# Patient Record
Sex: Male | Born: 1935
Health system: Southern US, Community
[De-identification: ages and names within clinical notes are randomized; demographics above are authoritative.]

## PROBLEM LIST (undated history)

## (undated) DIAGNOSIS — R569 Unspecified convulsions: Secondary | ICD-10-CM

## (undated) DIAGNOSIS — I6381 Other cerebral infarction due to occlusion or stenosis of small artery: Secondary | ICD-10-CM

## (undated) DIAGNOSIS — I119 Hypertensive heart disease without heart failure: Secondary | ICD-10-CM

## (undated) DIAGNOSIS — Z87442 Personal history of urinary calculi: Secondary | ICD-10-CM

## (undated) DIAGNOSIS — R001 Bradycardia, unspecified: Secondary | ICD-10-CM

## (undated) DIAGNOSIS — E039 Hypothyroidism, unspecified: Secondary | ICD-10-CM

## (undated) DIAGNOSIS — I6529 Occlusion and stenosis of unspecified carotid artery: Secondary | ICD-10-CM

## (undated) DIAGNOSIS — K8050 Calculus of bile duct without cholangitis or cholecystitis without obstruction: Secondary | ICD-10-CM

## (undated) DIAGNOSIS — M479 Spondylosis, unspecified: Secondary | ICD-10-CM

## (undated) DIAGNOSIS — I739 Peripheral vascular disease, unspecified: Secondary | ICD-10-CM

## (undated) DIAGNOSIS — N184 Chronic kidney disease, stage 4 (severe): Secondary | ICD-10-CM

## (undated) DIAGNOSIS — F329 Major depressive disorder, single episode, unspecified: Secondary | ICD-10-CM

## (undated) DIAGNOSIS — IMO0002 Reserved for concepts with insufficient information to code with codable children: Secondary | ICD-10-CM

## (undated) DIAGNOSIS — M4800 Spinal stenosis, site unspecified: Secondary | ICD-10-CM

## (undated) DIAGNOSIS — E1140 Type 2 diabetes mellitus with diabetic neuropathy, unspecified: Secondary | ICD-10-CM

## (undated) DIAGNOSIS — F3289 Other specified depressive episodes: Secondary | ICD-10-CM

## (undated) DIAGNOSIS — M1710 Unilateral primary osteoarthritis, unspecified knee: Secondary | ICD-10-CM

## (undated) DIAGNOSIS — I639 Cerebral infarction, unspecified: Secondary | ICD-10-CM

## (undated) DIAGNOSIS — E785 Hyperlipidemia, unspecified: Secondary | ICD-10-CM

## (undated) DIAGNOSIS — S14119A Complete lesion at unspecified level of cervical spinal cord, initial encounter: Secondary | ICD-10-CM

## (undated) DIAGNOSIS — E669 Obesity, unspecified: Secondary | ICD-10-CM

## (undated) HISTORY — DX: Spondylosis, unspecified: M47.9

## (undated) HISTORY — DX: Peripheral vascular disease, unspecified: I73.9

## (undated) HISTORY — DX: Unilateral primary osteoarthritis, unspecified knee: M17.10

## (undated) HISTORY — DX: Other specified depressive episodes: F32.89

## (undated) HISTORY — DX: Hyperlipidemia, unspecified: E78.5

## (undated) HISTORY — DX: Obesity, unspecified: E66.9

## (undated) HISTORY — DX: Spinal stenosis, site unspecified: M48.00

## (undated) HISTORY — DX: Major depressive disorder, single episode, unspecified: F32.9

## (undated) HISTORY — DX: Reserved for concepts with insufficient information to code with codable children: IMO0002

## (undated) HISTORY — PX: KIDNEY SURGERY: SHX687

## (undated) HISTORY — DX: Occlusion and stenosis of unspecified carotid artery: I65.29

---

## 1973-12-07 HISTORY — PX: OTHER SURGICAL HISTORY: SHX169

## 1998-12-12 ENCOUNTER — Encounter: Payer: Self-pay | Admitting: Surgery

## 1998-12-12 ENCOUNTER — Ambulatory Visit (HOSPITAL_COMMUNITY): Admission: RE | Admit: 1998-12-12 | Discharge: 1998-12-12 | Payer: Self-pay | Admitting: Surgery

## 2000-05-03 ENCOUNTER — Encounter: Payer: Self-pay | Admitting: *Deleted

## 2000-05-03 ENCOUNTER — Inpatient Hospital Stay (HOSPITAL_COMMUNITY): Admission: EM | Admit: 2000-05-03 | Discharge: 2000-05-04 | Payer: Self-pay | Admitting: Emergency Medicine

## 2001-06-14 ENCOUNTER — Encounter: Payer: Self-pay | Admitting: Family Medicine

## 2001-06-14 ENCOUNTER — Ambulatory Visit (HOSPITAL_COMMUNITY): Admission: RE | Admit: 2001-06-14 | Discharge: 2001-06-14 | Payer: Self-pay | Admitting: Family Medicine

## 2001-06-17 ENCOUNTER — Ambulatory Visit (HOSPITAL_COMMUNITY): Admission: RE | Admit: 2001-06-17 | Discharge: 2001-06-17 | Payer: Self-pay | Admitting: Family Medicine

## 2001-10-28 ENCOUNTER — Ambulatory Visit (HOSPITAL_COMMUNITY): Admission: RE | Admit: 2001-10-28 | Discharge: 2001-10-28 | Payer: Self-pay | Admitting: Otolaryngology

## 2001-10-28 ENCOUNTER — Encounter: Payer: Self-pay | Admitting: Otolaryngology

## 2003-01-18 ENCOUNTER — Encounter: Payer: Self-pay | Admitting: Family Medicine

## 2003-01-18 ENCOUNTER — Ambulatory Visit (HOSPITAL_COMMUNITY): Admission: RE | Admit: 2003-01-18 | Discharge: 2003-01-18 | Payer: Self-pay | Admitting: Family Medicine

## 2003-04-30 ENCOUNTER — Inpatient Hospital Stay (HOSPITAL_COMMUNITY): Admission: EM | Admit: 2003-04-30 | Discharge: 2003-05-04 | Payer: Self-pay | Admitting: Emergency Medicine

## 2003-04-30 ENCOUNTER — Encounter: Payer: Self-pay | Admitting: Emergency Medicine

## 2003-05-02 ENCOUNTER — Encounter: Payer: Self-pay | Admitting: *Deleted

## 2004-02-15 ENCOUNTER — Emergency Department (HOSPITAL_COMMUNITY): Admission: EM | Admit: 2004-02-15 | Discharge: 2004-02-15 | Payer: Self-pay | Admitting: Emergency Medicine

## 2004-11-07 ENCOUNTER — Ambulatory Visit: Payer: Self-pay | Admitting: Family Medicine

## 2004-12-19 ENCOUNTER — Ambulatory Visit: Payer: Self-pay | Admitting: Family Medicine

## 2005-01-23 ENCOUNTER — Ambulatory Visit: Payer: Self-pay | Admitting: Family Medicine

## 2005-03-03 ENCOUNTER — Ambulatory Visit: Payer: Self-pay | Admitting: Family Medicine

## 2005-03-25 ENCOUNTER — Ambulatory Visit: Payer: Self-pay | Admitting: Family Medicine

## 2005-03-26 ENCOUNTER — Ambulatory Visit (HOSPITAL_COMMUNITY): Admission: RE | Admit: 2005-03-26 | Discharge: 2005-03-26 | Payer: Self-pay | Admitting: Family Medicine

## 2005-04-16 ENCOUNTER — Ambulatory Visit (HOSPITAL_COMMUNITY): Admission: RE | Admit: 2005-04-16 | Discharge: 2005-04-16 | Payer: Self-pay | Admitting: Otolaryngology

## 2005-05-05 ENCOUNTER — Encounter (HOSPITAL_BASED_OUTPATIENT_CLINIC_OR_DEPARTMENT_OTHER): Admission: RE | Admit: 2005-05-05 | Discharge: 2005-08-03 | Payer: Self-pay | Admitting: Surgery

## 2005-05-26 ENCOUNTER — Ambulatory Visit: Payer: Self-pay | Admitting: Family Medicine

## 2005-05-28 ENCOUNTER — Ambulatory Visit (HOSPITAL_COMMUNITY): Admission: RE | Admit: 2005-05-28 | Discharge: 2005-05-28 | Payer: Self-pay | Admitting: Family Medicine

## 2005-06-08 ENCOUNTER — Ambulatory Visit: Payer: Self-pay | Admitting: Family Medicine

## 2005-07-20 ENCOUNTER — Ambulatory Visit: Payer: Self-pay | Admitting: Family Medicine

## 2005-08-06 ENCOUNTER — Encounter (HOSPITAL_COMMUNITY): Admission: RE | Admit: 2005-08-06 | Discharge: 2005-09-04 | Payer: Self-pay | Admitting: Neurosurgery

## 2005-08-21 ENCOUNTER — Encounter (HOSPITAL_BASED_OUTPATIENT_CLINIC_OR_DEPARTMENT_OTHER): Admission: RE | Admit: 2005-08-21 | Discharge: 2005-11-19 | Payer: Self-pay | Admitting: Surgery

## 2005-08-31 ENCOUNTER — Emergency Department (HOSPITAL_COMMUNITY): Admission: EM | Admit: 2005-08-31 | Discharge: 2005-08-31 | Payer: Self-pay | Admitting: Emergency Medicine

## 2005-08-31 ENCOUNTER — Ambulatory Visit: Payer: Self-pay | Admitting: Family Medicine

## 2005-09-01 ENCOUNTER — Inpatient Hospital Stay (HOSPITAL_COMMUNITY): Admission: AD | Admit: 2005-09-01 | Discharge: 2005-09-03 | Payer: Self-pay | Admitting: Internal Medicine

## 2005-09-01 ENCOUNTER — Ambulatory Visit: Payer: Self-pay | Admitting: Family Medicine

## 2005-09-07 ENCOUNTER — Ambulatory Visit: Payer: Self-pay | Admitting: Family Medicine

## 2005-09-17 ENCOUNTER — Ambulatory Visit: Payer: Self-pay | Admitting: Family Medicine

## 2005-10-22 ENCOUNTER — Ambulatory Visit: Payer: Self-pay | Admitting: Family Medicine

## 2005-12-03 ENCOUNTER — Ambulatory Visit: Payer: Self-pay | Admitting: Family Medicine

## 2005-12-24 ENCOUNTER — Encounter (HOSPITAL_BASED_OUTPATIENT_CLINIC_OR_DEPARTMENT_OTHER): Admission: RE | Admit: 2005-12-24 | Discharge: 2006-03-24 | Payer: Self-pay | Admitting: Surgery

## 2006-01-14 ENCOUNTER — Ambulatory Visit: Payer: Self-pay | Admitting: Family Medicine

## 2006-02-11 ENCOUNTER — Ambulatory Visit: Payer: Self-pay | Admitting: Family Medicine

## 2006-03-25 ENCOUNTER — Ambulatory Visit: Payer: Self-pay | Admitting: Family Medicine

## 2006-04-01 ENCOUNTER — Encounter (HOSPITAL_BASED_OUTPATIENT_CLINIC_OR_DEPARTMENT_OTHER): Admission: RE | Admit: 2006-04-01 | Discharge: 2006-04-13 | Payer: Self-pay | Admitting: Internal Medicine

## 2006-04-15 ENCOUNTER — Ambulatory Visit: Payer: Self-pay | Admitting: Family Medicine

## 2006-05-13 ENCOUNTER — Ambulatory Visit: Payer: Self-pay | Admitting: Family Medicine

## 2006-07-08 ENCOUNTER — Ambulatory Visit: Payer: Self-pay | Admitting: Family Medicine

## 2006-07-20 ENCOUNTER — Ambulatory Visit: Payer: Self-pay | Admitting: Family Medicine

## 2006-07-21 ENCOUNTER — Encounter (HOSPITAL_BASED_OUTPATIENT_CLINIC_OR_DEPARTMENT_OTHER): Admission: RE | Admit: 2006-07-21 | Discharge: 2006-09-02 | Payer: Self-pay | Admitting: Surgery

## 2006-09-06 ENCOUNTER — Encounter (HOSPITAL_BASED_OUTPATIENT_CLINIC_OR_DEPARTMENT_OTHER): Admission: RE | Admit: 2006-09-06 | Discharge: 2006-11-22 | Payer: Self-pay | Admitting: Surgery

## 2006-09-14 ENCOUNTER — Ambulatory Visit: Payer: Self-pay | Admitting: Family Medicine

## 2006-10-14 ENCOUNTER — Ambulatory Visit: Payer: Self-pay | Admitting: Family Medicine

## 2007-05-17 ENCOUNTER — Ambulatory Visit: Payer: Self-pay | Admitting: Family Medicine

## 2007-05-17 LAB — CONVERTED CEMR LAB
ALT: 15 units/L (ref 0–53)
AST: 18 units/L (ref 0–37)
Albumin: 3.9 g/dL (ref 3.5–5.2)
Alkaline Phosphatase: 98 units/L (ref 39–117)
BUN: 17 mg/dL (ref 6–23)
Bilirubin, Direct: 0.1 mg/dL (ref 0.0–0.3)
CO2: 25 meq/L (ref 19–32)
Calcium: 9.4 mg/dL (ref 8.4–10.5)
Chloride: 100 meq/L (ref 96–112)
Creatinine, Ser: 1.41 mg/dL (ref 0.40–1.50)
Glucose, Bld: 123 mg/dL — ABNORMAL HIGH (ref 70–99)
Indirect Bilirubin: 0.3 mg/dL (ref 0.0–0.9)
Potassium: 4.7 meq/L (ref 3.5–5.3)
Sodium: 138 meq/L (ref 135–145)
TSH: 10.786 microintl units/mL — ABNORMAL HIGH (ref 0.350–5.50)
Total Bilirubin: 0.4 mg/dL (ref 0.3–1.2)
Total Protein: 7.4 g/dL (ref 6.0–8.3)

## 2007-07-06 ENCOUNTER — Ambulatory Visit: Payer: Self-pay | Admitting: Family Medicine

## 2007-07-06 LAB — CONVERTED CEMR LAB
Basophils Absolute: 0 10*3/uL (ref 0.0–0.1)
Basophils Relative: 0 % (ref 0–1)
Eosinophils Absolute: 0.2 10*3/uL (ref 0.0–0.7)
Eosinophils Relative: 3 % (ref 0–5)
Ferritin: 412 ng/mL — ABNORMAL HIGH (ref 22–322)
Folate: 10.2 ng/mL
HCT: 43.3 % (ref 39.0–52.0)
Hemoglobin: 14.1 g/dL (ref 13.0–17.0)
Iron: 103 ug/dL (ref 42–165)
Lymphocytes Relative: 30 % (ref 12–46)
Lymphs Abs: 1.9 10*3/uL (ref 0.7–3.3)
MCHC: 32.6 g/dL (ref 30.0–36.0)
MCV: 96.7 fL (ref 78.0–100.0)
Monocytes Absolute: 0.5 10*3/uL (ref 0.2–0.7)
Monocytes Relative: 7 % (ref 3–11)
Neutro Abs: 3.8 10*3/uL (ref 1.7–7.7)
Neutrophils Relative %: 60 % (ref 43–77)
Platelets: 222 10*3/uL (ref 150–400)
RBC: 4.48 M/uL (ref 4.22–5.81)
RDW: 13.2 % (ref 11.5–14.0)
Retic Count, Absolute: 44.8 (ref 19.0–186.0)
Retic Ct Pct: 1 % (ref 0.4–3.1)
Saturation Ratios: 40 % (ref 20–55)
TIBC: 259 ug/dL (ref 215–435)
UIBC: 156 ug/dL
Vitamin B-12: 829 pg/mL (ref 211–911)
WBC: 6.4 10*3/uL (ref 4.0–10.5)

## 2007-08-22 ENCOUNTER — Ambulatory Visit: Payer: Self-pay | Admitting: Family Medicine

## 2007-08-22 LAB — CONVERTED CEMR LAB
Cholesterol: 204 mg/dL — ABNORMAL HIGH (ref 0–200)
HDL: 66 mg/dL (ref >39)
LDL Cholesterol: 123 mg/dL — ABNORMAL HIGH (ref 0–99)
TSH: 7.268 microintl units/mL — ABNORMAL HIGH (ref 0.350–5.50)
Total CHOL/HDL Ratio: 3.1
Triglycerides: 74 mg/dL (ref <150)
VLDL: 15 mg/dL (ref 0–40)

## 2007-10-05 ENCOUNTER — Ambulatory Visit: Payer: Self-pay | Admitting: Family Medicine

## 2007-11-23 ENCOUNTER — Ambulatory Visit: Payer: Self-pay | Admitting: Family Medicine

## 2007-11-23 LAB — CONVERTED CEMR LAB
ALT: 21 units/L (ref 0–53)
AST: 19 units/L (ref 0–37)
Albumin: 3.9 g/dL (ref 3.5–5.2)
Alkaline Phosphatase: 120 units/L — ABNORMAL HIGH (ref 39–117)
BUN: 16 mg/dL (ref 6–23)
Bilirubin, Direct: 0.1 mg/dL (ref 0.0–0.3)
CO2: 25 meq/L (ref 19–32)
Calcium: 9.6 mg/dL (ref 8.4–10.5)
Chloride: 101 meq/L (ref 96–112)
Cholesterol: 168 mg/dL (ref 0–200)
Creatinine, Ser: 1.5 mg/dL (ref 0.40–1.50)
Glucose, Bld: 361 mg/dL — ABNORMAL HIGH (ref 70–99)
HDL: 70 mg/dL (ref >39)
Indirect Bilirubin: 0.4 mg/dL (ref 0.0–0.9)
LDL Cholesterol: 82 mg/dL (ref 0–99)
Microalb, Ur: 2.88 mg/dL — ABNORMAL HIGH (ref 0.00–1.89)
PSA: 1.76 ng/mL (ref 0.10–4.00)
Potassium: 4.7 meq/L (ref 3.5–5.3)
Sodium: 137 meq/L (ref 135–145)
TSH: 4.11 microintl units/mL (ref 0.350–5.50)
Total Bilirubin: 0.5 mg/dL (ref 0.3–1.2)
Total CHOL/HDL Ratio: 2.4
Total Protein: 7.2 g/dL (ref 6.0–8.3)
Triglycerides: 79 mg/dL (ref <150)
VLDL: 16 mg/dL (ref 0–40)

## 2007-11-24 ENCOUNTER — Encounter: Payer: Self-pay | Admitting: Family Medicine

## 2007-11-24 LAB — CONVERTED CEMR LAB: PSA: 1.76 ng/mL

## 2007-12-29 ENCOUNTER — Ambulatory Visit: Payer: Self-pay | Admitting: Family Medicine

## 2008-01-02 ENCOUNTER — Ambulatory Visit (HOSPITAL_COMMUNITY): Admission: RE | Admit: 2008-01-02 | Discharge: 2008-01-02 | Payer: Self-pay | Admitting: Family Medicine

## 2008-01-19 ENCOUNTER — Emergency Department (HOSPITAL_COMMUNITY): Admission: EM | Admit: 2008-01-19 | Discharge: 2008-01-19 | Payer: Self-pay | Admitting: Emergency Medicine

## 2008-01-26 ENCOUNTER — Ambulatory Visit: Payer: Self-pay | Admitting: Orthopedic Surgery

## 2008-01-26 ENCOUNTER — Encounter (INDEPENDENT_AMBULATORY_CARE_PROVIDER_SITE_OTHER): Payer: Self-pay | Admitting: *Deleted

## 2008-01-26 DIAGNOSIS — M25569 Pain in unspecified knee: Secondary | ICD-10-CM | POA: Insufficient documentation

## 2008-01-26 DIAGNOSIS — M1710 Unilateral primary osteoarthritis, unspecified knee: Secondary | ICD-10-CM

## 2008-01-26 DIAGNOSIS — M4800 Spinal stenosis, site unspecified: Secondary | ICD-10-CM | POA: Insufficient documentation

## 2008-01-26 DIAGNOSIS — M549 Dorsalgia, unspecified: Secondary | ICD-10-CM | POA: Insufficient documentation

## 2008-01-26 DIAGNOSIS — M545 Low back pain, unspecified: Secondary | ICD-10-CM | POA: Insufficient documentation

## 2008-01-26 DIAGNOSIS — M479 Spondylosis, unspecified: Secondary | ICD-10-CM | POA: Insufficient documentation

## 2008-01-26 DIAGNOSIS — IMO0002 Reserved for concepts with insufficient information to code with codable children: Secondary | ICD-10-CM | POA: Insufficient documentation

## 2008-01-26 DIAGNOSIS — M23302 Other meniscus derangements, unspecified lateral meniscus, unspecified knee: Secondary | ICD-10-CM | POA: Insufficient documentation

## 2008-01-27 ENCOUNTER — Ambulatory Visit: Payer: Self-pay | Admitting: Family Medicine

## 2008-01-31 ENCOUNTER — Encounter: Payer: Self-pay | Admitting: Orthopedic Surgery

## 2008-02-07 ENCOUNTER — Telehealth: Payer: Self-pay | Admitting: Orthopedic Surgery

## 2008-02-14 ENCOUNTER — Encounter (HOSPITAL_BASED_OUTPATIENT_CLINIC_OR_DEPARTMENT_OTHER): Admission: RE | Admit: 2008-02-14 | Discharge: 2008-04-09 | Payer: Self-pay | Admitting: Surgery

## 2008-02-14 ENCOUNTER — Ambulatory Visit: Payer: Self-pay | Admitting: Family Medicine

## 2008-02-17 ENCOUNTER — Encounter: Payer: Self-pay | Admitting: Orthopedic Surgery

## 2008-03-01 ENCOUNTER — Ambulatory Visit: Payer: Self-pay | Admitting: Family Medicine

## 2008-03-09 ENCOUNTER — Encounter: Payer: Self-pay | Admitting: Family Medicine

## 2008-03-09 DIAGNOSIS — E039 Hypothyroidism, unspecified: Secondary | ICD-10-CM | POA: Insufficient documentation

## 2008-03-09 DIAGNOSIS — E782 Mixed hyperlipidemia: Secondary | ICD-10-CM | POA: Insufficient documentation

## 2008-03-09 DIAGNOSIS — I150 Renovascular hypertension: Secondary | ICD-10-CM | POA: Insufficient documentation

## 2008-03-09 DIAGNOSIS — F329 Major depressive disorder, single episode, unspecified: Secondary | ICD-10-CM | POA: Insufficient documentation

## 2008-03-09 DIAGNOSIS — I739 Peripheral vascular disease, unspecified: Secondary | ICD-10-CM | POA: Insufficient documentation

## 2008-03-09 DIAGNOSIS — F3289 Other specified depressive episodes: Secondary | ICD-10-CM | POA: Insufficient documentation

## 2008-03-09 DIAGNOSIS — E669 Obesity, unspecified: Secondary | ICD-10-CM | POA: Insufficient documentation

## 2008-04-12 ENCOUNTER — Ambulatory Visit: Payer: Self-pay | Admitting: Family Medicine

## 2008-06-12 ENCOUNTER — Ambulatory Visit: Payer: Self-pay | Admitting: Family Medicine

## 2008-06-13 ENCOUNTER — Encounter: Payer: Self-pay | Admitting: Family Medicine

## 2008-06-13 LAB — CONVERTED CEMR LAB
ALT: 16 units/L (ref 0–53)
AST: 18 units/L (ref 0–37)
Albumin: 3.7 g/dL (ref 3.5–5.2)
Alkaline Phosphatase: 86 units/L (ref 39–117)
BUN: 17 mg/dL (ref 6–23)
Basophils Absolute: 0 10*3/uL (ref 0.0–0.1)
Basophils Relative: 0 % (ref 0–1)
Bilirubin, Direct: 0.1 mg/dL (ref 0.0–0.3)
CO2: 23 meq/L (ref 19–32)
Calcium: 9.1 mg/dL (ref 8.4–10.5)
Chloride: 106 meq/L (ref 96–112)
Cholesterol: 178 mg/dL (ref 0–200)
Creatinine, Ser: 1.33 mg/dL (ref 0.40–1.50)
Eosinophils Absolute: 0.1 10*3/uL (ref 0.0–0.7)
Eosinophils Relative: 2 % (ref 0–5)
Glucose, Bld: 108 mg/dL — ABNORMAL HIGH (ref 70–99)
HCT: 38.2 % — ABNORMAL LOW (ref 39.0–52.0)
HDL: 68 mg/dL (ref >39)
Hemoglobin: 12.2 g/dL — ABNORMAL LOW (ref 13.0–17.0)
Indirect Bilirubin: 0.3 mg/dL (ref 0.0–0.9)
LDL Cholesterol: 100 mg/dL — ABNORMAL HIGH (ref 0–99)
Lymphocytes Relative: 23 % (ref 12–46)
Lymphs Abs: 1.5 10*3/uL (ref 0.7–4.0)
MCHC: 31.9 g/dL (ref 30.0–36.0)
MCV: 98.7 fL (ref 78.0–100.0)
Monocytes Absolute: 0.6 10*3/uL (ref 0.1–1.0)
Monocytes Relative: 10 % (ref 3–12)
Neutro Abs: 4.4 10*3/uL (ref 1.7–7.7)
Neutrophils Relative %: 65 % (ref 43–77)
Platelets: 232 10*3/uL (ref 150–400)
Potassium: 4.1 meq/L (ref 3.5–5.3)
RBC: 3.87 M/uL — ABNORMAL LOW (ref 4.22–5.81)
RDW: 13.3 % (ref 11.5–15.5)
Sodium: 140 meq/L (ref 135–145)
TSH: 0.931 microintl units/mL (ref 0.350–4.50)
Total Bilirubin: 0.4 mg/dL (ref 0.3–1.2)
Total CHOL/HDL Ratio: 2.6
Total Protein: 6.6 g/dL (ref 6.0–8.3)
Triglycerides: 48 mg/dL (ref <150)
VLDL: 10 mg/dL (ref 0–40)
WBC: 6.7 10*3/uL (ref 4.0–10.5)

## 2008-06-14 ENCOUNTER — Encounter: Payer: Self-pay | Admitting: Family Medicine

## 2008-06-14 LAB — CONVERTED CEMR LAB: Retic Ct Pct: 1.3 % (ref 0.4–3.1)

## 2008-08-21 ENCOUNTER — Ambulatory Visit: Payer: Self-pay | Admitting: Family Medicine

## 2008-08-21 DIAGNOSIS — E1021 Type 1 diabetes mellitus with diabetic nephropathy: Secondary | ICD-10-CM | POA: Insufficient documentation

## 2008-08-21 LAB — CONVERTED CEMR LAB
Glucose, Bld: 285 mg/dL
Hgb A1c MFr Bld: 8.3 %
OCCULT 1: NEGATIVE

## 2008-08-29 ENCOUNTER — Encounter: Payer: Self-pay | Admitting: Family Medicine

## 2008-09-12 ENCOUNTER — Ambulatory Visit: Payer: Self-pay | Admitting: Family Medicine

## 2008-09-12 LAB — CONVERTED CEMR LAB: Blood Glucose, Fingerstick: 234

## 2008-09-28 ENCOUNTER — Telehealth: Payer: Self-pay | Admitting: Family Medicine

## 2008-11-08 ENCOUNTER — Encounter (HOSPITAL_BASED_OUTPATIENT_CLINIC_OR_DEPARTMENT_OTHER): Admission: RE | Admit: 2008-11-08 | Discharge: 2008-12-03 | Payer: Self-pay | Admitting: Internal Medicine

## 2008-11-14 ENCOUNTER — Ambulatory Visit: Payer: Self-pay | Admitting: Family Medicine

## 2008-11-14 LAB — CONVERTED CEMR LAB
Glucose, Bld: 270 mg/dL
Hgb A1c MFr Bld: 9.6 %

## 2008-12-27 ENCOUNTER — Ambulatory Visit: Payer: Self-pay | Admitting: Family Medicine

## 2008-12-27 DIAGNOSIS — J019 Acute sinusitis, unspecified: Secondary | ICD-10-CM | POA: Insufficient documentation

## 2008-12-31 ENCOUNTER — Encounter (HOSPITAL_BASED_OUTPATIENT_CLINIC_OR_DEPARTMENT_OTHER): Admission: RE | Admit: 2008-12-31 | Discharge: 2009-03-31 | Payer: Self-pay | Admitting: Internal Medicine

## 2008-12-31 ENCOUNTER — Encounter (HOSPITAL_BASED_OUTPATIENT_CLINIC_OR_DEPARTMENT_OTHER): Admission: RE | Admit: 2008-12-31 | Discharge: 2009-04-01 | Payer: Self-pay | Admitting: Internal Medicine

## 2008-12-31 LAB — CONVERTED CEMR LAB: TSH: 11.023 microintl units/mL — ABNORMAL HIGH (ref 0.350–4.50)

## 2009-01-10 ENCOUNTER — Encounter: Payer: Self-pay | Admitting: Family Medicine

## 2009-01-30 ENCOUNTER — Encounter: Payer: Self-pay | Admitting: Family Medicine

## 2009-02-25 ENCOUNTER — Encounter: Payer: Self-pay | Admitting: Family Medicine

## 2009-03-13 ENCOUNTER — Encounter: Payer: Self-pay | Admitting: Family Medicine

## 2009-04-08 ENCOUNTER — Telehealth: Payer: Self-pay | Admitting: Family Medicine

## 2009-04-08 ENCOUNTER — Telehealth (INDEPENDENT_AMBULATORY_CARE_PROVIDER_SITE_OTHER): Payer: Self-pay | Admitting: *Deleted

## 2009-04-09 ENCOUNTER — Encounter: Payer: Self-pay | Admitting: Family Medicine

## 2009-04-16 ENCOUNTER — Ambulatory Visit: Payer: Self-pay | Admitting: Family Medicine

## 2009-04-16 LAB — CONVERTED CEMR LAB
Glucose, Bld: 214 mg/dL
Hgb A1c MFr Bld: 8.9 %

## 2009-04-17 ENCOUNTER — Telehealth: Payer: Self-pay | Admitting: Family Medicine

## 2009-04-17 LAB — CONVERTED CEMR LAB
ALT: 15 units/L (ref 0–53)
AST: 19 units/L (ref 0–37)
Albumin: 3.8 g/dL (ref 3.5–5.2)
Alkaline Phosphatase: 81 units/L (ref 39–117)
BUN: 18 mg/dL (ref 6–23)
Bilirubin, Direct: 0.1 mg/dL (ref 0.0–0.3)
CO2: 23 meq/L (ref 19–32)
Calcium: 9.3 mg/dL (ref 8.4–10.5)
Chloride: 105 meq/L (ref 96–112)
Cholesterol: 204 mg/dL — ABNORMAL HIGH (ref 0–200)
Creatinine, Ser: 1.54 mg/dL — ABNORMAL HIGH (ref 0.40–1.50)
Creatinine, Urine: 117.9 mg/dL
Glucose, Bld: 205 mg/dL — ABNORMAL HIGH (ref 70–99)
HDL: 62 mg/dL (ref >39)
Indirect Bilirubin: 0.3 mg/dL (ref 0.0–0.9)
LDL Cholesterol: 126 mg/dL — ABNORMAL HIGH (ref 0–99)
Microalb Creat Ratio: 122.2 mg/g — ABNORMAL HIGH (ref 0.0–30.0)
Microalb, Ur: 14.41 mg/dL — ABNORMAL HIGH (ref 0.00–1.89)
PSA: 1.97 ng/mL (ref 0.10–4.00)
Potassium: 4.6 meq/L (ref 3.5–5.3)
Sodium: 138 meq/L (ref 135–145)
TSH: 15.529 microintl units/mL — ABNORMAL HIGH (ref 0.350–4.500)
Total Bilirubin: 0.4 mg/dL (ref 0.3–1.2)
Total CHOL/HDL Ratio: 3.3
Total Protein: 7 g/dL (ref 6.0–8.3)
Triglycerides: 78 mg/dL (ref <150)
VLDL: 16 mg/dL (ref 0–40)

## 2009-04-29 ENCOUNTER — Encounter: Payer: Self-pay | Admitting: Family Medicine

## 2009-05-01 ENCOUNTER — Telehealth: Payer: Self-pay | Admitting: Family Medicine

## 2009-05-02 ENCOUNTER — Encounter: Payer: Self-pay | Admitting: Family Medicine

## 2009-05-07 ENCOUNTER — Telehealth: Payer: Self-pay | Admitting: Family Medicine

## 2009-07-15 ENCOUNTER — Encounter: Payer: Self-pay | Admitting: Family Medicine

## 2009-08-15 ENCOUNTER — Ambulatory Visit: Payer: Self-pay | Admitting: Family Medicine

## 2009-08-15 DIAGNOSIS — E1169 Type 2 diabetes mellitus with other specified complication: Secondary | ICD-10-CM | POA: Insufficient documentation

## 2009-08-15 LAB — CONVERTED CEMR LAB
Glucose, Bld: 258 mg/dL
Hgb A1c MFr Bld: 8.6 %

## 2009-08-21 ENCOUNTER — Encounter: Payer: Self-pay | Admitting: Family Medicine

## 2009-08-27 ENCOUNTER — Encounter: Payer: Self-pay | Admitting: Family Medicine

## 2009-08-28 ENCOUNTER — Encounter: Payer: Self-pay | Admitting: Family Medicine

## 2009-08-30 LAB — CONVERTED CEMR LAB
BUN: 24 mg/dL — ABNORMAL HIGH (ref 6–23)
Basophils Absolute: 0 10*3/uL (ref 0.0–0.1)
Basophils Relative: 0 % (ref 0–1)
CO2: 25 meq/L (ref 19–32)
Calcium: 9.5 mg/dL (ref 8.4–10.5)
Chloride: 104 meq/L (ref 96–112)
Creatinine, Ser: 1.63 mg/dL — ABNORMAL HIGH (ref 0.40–1.50)
Eosinophils Absolute: 0.1 10*3/uL (ref 0.0–0.7)
Eosinophils Relative: 2 % (ref 0–5)
Glucose, Bld: 253 mg/dL — ABNORMAL HIGH (ref 70–99)
HCT: 40.9 % (ref 39.0–52.0)
Hemoglobin: 13 g/dL (ref 13.0–17.0)
Lymphocytes Relative: 24 % (ref 12–46)
Lymphs Abs: 1.7 10*3/uL (ref 0.7–4.0)
MCHC: 31.8 g/dL (ref 30.0–36.0)
MCV: 98.3 fL (ref 78.0–100.0)
Monocytes Absolute: 0.6 10*3/uL (ref 0.1–1.0)
Monocytes Relative: 9 % (ref 3–12)
Neutro Abs: 4.7 10*3/uL (ref 1.7–7.7)
Neutrophils Relative %: 66 % (ref 43–77)
Platelets: 205 10*3/uL (ref 150–400)
Potassium: 4.4 meq/L (ref 3.5–5.3)
RBC: 4.16 M/uL — ABNORMAL LOW (ref 4.22–5.81)
RDW: 13.6 % (ref 11.5–15.5)
Sodium: 140 meq/L (ref 135–145)
TSH: 18.556 microintl units/mL — ABNORMAL HIGH (ref 0.350–4.500)
Total CK: 188 units/L (ref 7–232)
WBC: 7.1 10*3/uL (ref 4.0–10.5)

## 2009-10-18 ENCOUNTER — Encounter: Payer: Self-pay | Admitting: Family Medicine

## 2009-11-20 ENCOUNTER — Ambulatory Visit: Payer: Self-pay | Admitting: Family Medicine

## 2009-11-20 DIAGNOSIS — R209 Unspecified disturbances of skin sensation: Secondary | ICD-10-CM | POA: Insufficient documentation

## 2009-11-20 LAB — CONVERTED CEMR LAB: Glucose, Bld: 248 mg/dL

## 2009-11-22 ENCOUNTER — Telehealth: Payer: Self-pay | Admitting: Family Medicine

## 2009-12-10 ENCOUNTER — Encounter: Payer: Self-pay | Admitting: Family Medicine

## 2009-12-11 ENCOUNTER — Ambulatory Visit (HOSPITAL_COMMUNITY): Admission: RE | Admit: 2009-12-11 | Discharge: 2009-12-11 | Payer: Self-pay | Admitting: Neurology

## 2009-12-16 ENCOUNTER — Encounter: Payer: Self-pay | Admitting: Family Medicine

## 2009-12-24 ENCOUNTER — Encounter: Payer: Self-pay | Admitting: Family Medicine

## 2010-01-01 ENCOUNTER — Ambulatory Visit: Payer: Self-pay | Admitting: Family Medicine

## 2010-01-01 DIAGNOSIS — R5383 Other fatigue: Secondary | ICD-10-CM

## 2010-01-01 DIAGNOSIS — R5381 Other malaise: Secondary | ICD-10-CM | POA: Insufficient documentation

## 2010-01-01 LAB — CONVERTED CEMR LAB
Glucose, Bld: 309 mg/dL
Hgb A1c MFr Bld: 10.5 %

## 2010-01-07 LAB — CONVERTED CEMR LAB
ALT: 13 units/L (ref 0–53)
AST: 20 units/L (ref 0–37)
Albumin: 4.2 g/dL (ref 3.5–5.2)
Alkaline Phosphatase: 90 units/L (ref 39–117)
BUN: 23 mg/dL (ref 6–23)
Bilirubin, Direct: 0.1 mg/dL (ref 0.0–0.3)
CO2: 25 meq/L (ref 19–32)
Calcium: 9.5 mg/dL (ref 8.4–10.5)
Chloride: 101 meq/L (ref 96–112)
Cholesterol: 218 mg/dL — ABNORMAL HIGH (ref 0–200)
Creatinine, Ser: 1.66 mg/dL — ABNORMAL HIGH (ref 0.40–1.50)
Glucose, Bld: 275 mg/dL — ABNORMAL HIGH (ref 70–99)
HDL: 79 mg/dL (ref >39)
Indirect Bilirubin: 0.5 mg/dL (ref 0.0–0.9)
LDL Cholesterol: 124 mg/dL — ABNORMAL HIGH (ref 0–99)
Potassium: 4.4 meq/L (ref 3.5–5.3)
Sodium: 138 meq/L (ref 135–145)
TSH: 19.813 microintl units/mL — ABNORMAL HIGH (ref 0.350–4.500)
Total Bilirubin: 0.6 mg/dL (ref 0.3–1.2)
Total CHOL/HDL Ratio: 2.8
Total Protein: 7.4 g/dL (ref 6.0–8.3)
Triglycerides: 73 mg/dL (ref <150)
VLDL: 15 mg/dL (ref 0–40)

## 2010-01-28 ENCOUNTER — Encounter: Payer: Self-pay | Admitting: Family Medicine

## 2010-01-30 ENCOUNTER — Encounter: Payer: Self-pay | Admitting: Family Medicine

## 2010-03-05 ENCOUNTER — Ambulatory Visit: Payer: Self-pay | Admitting: Family Medicine

## 2010-03-05 LAB — CONVERTED CEMR LAB: Glucose, Bld: 253 mg/dL

## 2010-03-10 LAB — CONVERTED CEMR LAB
BUN: 22 mg/dL (ref 6–23)
CO2: 25 meq/L (ref 19–32)
Calcium: 9.5 mg/dL (ref 8.4–10.5)
Chloride: 101 meq/L (ref 96–112)
Creatinine, Ser: 1.53 mg/dL — ABNORMAL HIGH (ref 0.40–1.50)
Glucose, Bld: 209 mg/dL — ABNORMAL HIGH (ref 70–99)
Hgb A1c MFr Bld: 10.5 % — ABNORMAL HIGH (ref 4.6–6.1)
Potassium: 4.7 meq/L (ref 3.5–5.3)
Sodium: 137 meq/L (ref 135–145)
TSH: 17.579 microintl units/mL — ABNORMAL HIGH (ref 0.350–4.500)

## 2010-03-25 ENCOUNTER — Telehealth: Payer: Self-pay | Admitting: Family Medicine

## 2010-04-22 ENCOUNTER — Ambulatory Visit: Payer: Self-pay | Admitting: Family Medicine

## 2010-05-06 LAB — CONVERTED CEMR LAB
Creatinine, Urine: 105.5 mg/dL
Microalb Creat Ratio: 139.4 mg/g — ABNORMAL HIGH (ref 0.0–30.0)
Microalb, Ur: 14.71 mg/dL — ABNORMAL HIGH (ref 0.00–1.89)
PSA: 2.27 ng/mL (ref 0.10–4.00)
TSH: 0.094 microintl units/mL — ABNORMAL LOW (ref 0.350–4.500)

## 2010-06-18 ENCOUNTER — Encounter: Payer: Self-pay | Admitting: Family Medicine

## 2010-07-01 ENCOUNTER — Telehealth: Payer: Self-pay | Admitting: Family Medicine

## 2010-07-02 ENCOUNTER — Encounter: Payer: Self-pay | Admitting: Family Medicine

## 2010-08-27 ENCOUNTER — Ambulatory Visit: Payer: Self-pay | Admitting: Family Medicine

## 2010-08-27 LAB — CONVERTED CEMR LAB: Blood Glucose, Fasting: 218 mg/dL

## 2010-08-28 ENCOUNTER — Encounter: Payer: Self-pay | Admitting: Family Medicine

## 2010-08-29 ENCOUNTER — Encounter: Payer: Self-pay | Admitting: Family Medicine

## 2010-08-29 LAB — CONVERTED CEMR LAB
ALT: 14 units/L (ref 0–53)
AST: 18 units/L (ref 0–37)
Albumin: 4.1 g/dL (ref 3.5–5.2)
Alkaline Phosphatase: 99 units/L (ref 39–117)
BUN: 23 mg/dL (ref 6–23)
Basophils Absolute: 0 10*3/uL (ref 0.0–0.1)
Basophils Relative: 0 % (ref 0–1)
Bilirubin, Direct: 0.1 mg/dL (ref 0.0–0.3)
CO2: 27 meq/L (ref 19–32)
Calcium: 9.6 mg/dL (ref 8.4–10.5)
Chloride: 101 meq/L (ref 96–112)
Cholesterol: 214 mg/dL — ABNORMAL HIGH (ref 0–200)
Creatinine, Ser: 1.56 mg/dL — ABNORMAL HIGH (ref 0.40–1.50)
Eosinophils Absolute: 0.4 10*3/uL (ref 0.0–0.7)
Eosinophils Relative: 5 % (ref 0–5)
Glucose, Bld: 209 mg/dL — ABNORMAL HIGH (ref 70–99)
HCT: 39.6 % (ref 39.0–52.0)
HDL: 73 mg/dL (ref >39)
Hemoglobin: 13 g/dL (ref 13.0–17.0)
Hgb A1c MFr Bld: 11.8 % — ABNORMAL HIGH (ref <5.7)
Indirect Bilirubin: 0.5 mg/dL (ref 0.0–0.9)
LDL Cholesterol: 129 mg/dL — ABNORMAL HIGH (ref 0–99)
Lymphocytes Relative: 32 % (ref 12–46)
Lymphs Abs: 2.4 10*3/uL (ref 0.7–4.0)
MCHC: 32.8 g/dL (ref 30.0–36.0)
MCV: 95.7 fL (ref 78.0–100.0)
Monocytes Absolute: 0.7 10*3/uL (ref 0.1–1.0)
Monocytes Relative: 9 % (ref 3–12)
Neutro Abs: 3.9 10*3/uL (ref 1.7–7.7)
Neutrophils Relative %: 53 % (ref 43–77)
Platelets: 222 10*3/uL (ref 150–400)
Potassium: 4.8 meq/L (ref 3.5–5.3)
RBC: 4.14 M/uL — ABNORMAL LOW (ref 4.22–5.81)
RDW: 12.7 % (ref 11.5–15.5)
Sodium: 137 meq/L (ref 135–145)
TSH: 4.012 microintl units/mL (ref 0.350–4.500)
Total Bilirubin: 0.6 mg/dL (ref 0.3–1.2)
Total CHOL/HDL Ratio: 2.9
Total Protein: 7.6 g/dL (ref 6.0–8.3)
Triglycerides: 59 mg/dL (ref <150)
VLDL: 12 mg/dL (ref 0–40)
WBC: 7.4 10*3/uL (ref 4.0–10.5)

## 2010-11-11 ENCOUNTER — Telehealth: Payer: Self-pay | Admitting: Family Medicine

## 2010-11-26 ENCOUNTER — Encounter: Payer: Self-pay | Admitting: Family Medicine

## 2010-11-26 ENCOUNTER — Ambulatory Visit: Payer: Self-pay | Admitting: Family Medicine

## 2010-11-26 DIAGNOSIS — E1121 Type 2 diabetes mellitus with diabetic nephropathy: Secondary | ICD-10-CM | POA: Insufficient documentation

## 2010-11-26 DIAGNOSIS — E1122 Type 2 diabetes mellitus with diabetic chronic kidney disease: Secondary | ICD-10-CM | POA: Insufficient documentation

## 2010-11-26 DIAGNOSIS — E1059 Type 1 diabetes mellitus with other circulatory complications: Secondary | ICD-10-CM | POA: Insufficient documentation

## 2010-11-26 DIAGNOSIS — IMO0002 Reserved for concepts with insufficient information to code with codable children: Secondary | ICD-10-CM | POA: Insufficient documentation

## 2010-11-26 DIAGNOSIS — Z794 Long term (current) use of insulin: Secondary | ICD-10-CM

## 2010-11-26 LAB — HM DIABETES FOOT EXAM

## 2010-12-02 LAB — CONVERTED CEMR LAB
ALT: 14 units/L (ref 0–53)
AST: 18 units/L (ref 0–37)
Albumin: 4 g/dL (ref 3.5–5.2)
Alkaline Phosphatase: 94 units/L (ref 39–117)
Bilirubin, Direct: 0.1 mg/dL (ref 0.0–0.3)
Cholesterol: 157 mg/dL (ref 0–200)
HDL: 66 mg/dL (ref >39)
Hgb A1c MFr Bld: 12.3 % — ABNORMAL HIGH (ref <5.7)
Indirect Bilirubin: 0.4 mg/dL (ref 0.0–0.9)
LDL Cholesterol: 82 mg/dL (ref 0–99)
TSH: 1.698 microintl units/mL (ref 0.350–4.500)
Total Bilirubin: 0.5 mg/dL (ref 0.3–1.2)
Total CHOL/HDL Ratio: 2.4
Total Protein: 7.2 g/dL (ref 6.0–8.3)
Triglycerides: 45 mg/dL (ref <150)
VLDL: 9 mg/dL (ref 0–40)
Vitamin B-12: 638 pg/mL (ref 211–911)

## 2010-12-03 ENCOUNTER — Telehealth: Payer: Self-pay | Admitting: Family Medicine

## 2010-12-28 ENCOUNTER — Encounter: Payer: Self-pay | Admitting: Otolaryngology

## 2011-01-06 NOTE — Medication Information (Signed)
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

## 2011-01-06 NOTE — Letter (Signed)
Summary: FMLA PAPER FOR SON  FMLA PAPER FOR SON   Imported By: Dierdre Harness 07/15/2009 09:45:39  _____________________________________________________________________  External Attachment:    Type:   Image     Comment:   External Document

## 2011-01-06 NOTE — Medication Information (Signed)
Summary: DIABETIC SHOES  DIABETIC SHOES   Imported By: Dierdre Harness 04/11/2009 15:32:07  _____________________________________________________________________  External Attachment:    Type:   Image     Comment:   External Document

## 2011-01-06 NOTE — Progress Notes (Signed)
Summary: needs letter  Phone Note Call from Patient   Summary of Call: needs a note  stating that he is unable to fasten his seatbelt he got a ticket last sunday going to church dr. Berline Lopes needs rx for Johnchristopher to get diabetic shoes  Initial call taken by: Dierdre Harness,  Apr 08, 2009 3:33 PM  Follow-up for Phone Call        advise I am unable to state that,even if he has difficulty fastening the seatbelt he shouldhave someone do it for him Follow-up by: Tula Nakayama MD,  Apr 08, 2009 4:55 PM  Additional Follow-up for Phone Call Additional follow up Details #1::        returned call, left message Additional Follow-up by: Jimmey Ralph LPN,  May  3, 624THL [id number] PM    Additional Follow-up for Phone Call Additional follow up Details #2::    you wrote one like this in 2004, and needs a new one Follow-up by: Jimmey Ralph LPN,  May  5, 624THL [id number] AM  Additional Follow-up for Phone Call Additional follow up Details #3:: Details for Additional Follow-up Action Taken: he needs fasting lipid, hepatic , chem7 and oV , last here in Dec, chol and blood sugar uncontrolled, will write letter after re-eval, also tsh, psa and microalb, have him get thes befor the ov pls, and he needs to bring all his meds  patiENT IS COMING 5.11.2010  [place]  Apr 11, 2009 2:53 PM  Additional Follow-up by: Tula Nakayama MD,  Apr 10, 2009 12:34 PM    called patient, left message, Jimmey Ralph LPN  May  6, 624THL 579FGE AM  pls sched appt for the pt, he neeeds fasting labs also before the visit and needds to bring all his meds

## 2011-01-06 NOTE — Assessment & Plan Note (Signed)
Summary: F UP   Vital Signs:  Patient profile:   77 year old male Height:      66.5 inches Weight:      203.75 pounds BMI:     32.51 O2 Sat:      93 % on Room air Pulse rate:   77 / minute Pulse rhythm:   regular Resp:     16 per minute BP sitting:   150 / 90  (left arm) Cuff size:   large  Vitals Entered By: Kate Sable LPN (March 30, 624THL 579FGE AM)  Nutrition Counseling: Patient's BMI is greater than 25 and therefore counseled on weight management options.  O2 Flow:  Room air CC: Follow up chronic problem   Primary Care Provider:  Lamark Schue, md  CC:  Follow up chronic problem.  History of Present Illness: pt states  he feelks well even though he is aware that he is being non compliant with his meds and diet. He cites finances as a barrier. Over the years, I have tried in many ways to help this pt as far as medication, and involvement of his family with diabetic care. Of note for the past year he has come in unaccompanied, which is unfortunate .   Denies recent fever or chills. Denies sinus pressure, nasal congestion , ear pain or sore throat. Denies chest congestion, or cough productive of sputum. Denies chest pain, palpitations, PND, orthopnea or leg swelling. Denies abdominal pain, nausea, vomitting, diarrhea or constipation. Denies change in bowel movements or bloody stool. Denies dysuria , frequency, incontinence or hesitancy. Denies  joint pain, swelling, or reduced mobility. Denies headaches, vertigo, seizures.He continues to have gait instability and peripheral numbness, he is being followed by neurology for this as well, and has been started on Vit D Denies depression, anxiety or insomnia. Denies  rash, lesions, or itch.     Current Medications (verified): 1)  Furosemide 20 Mg  Tabs (Furosemide) .... Take 1 Tablet By Mouth Once A Day 2)  Amlodipine Besylate 5 Mg  Tabs (Amlodipine Besylate) .... Take 1 Tablet By Mouth Once A Day 3)  Aspirin 81 Mg  Tbec  (Aspirin) .... Take 1 Tablet By Mouth Once A Day 4)  Glipizide 10 Mg  Tb24 (Glipizide) .... Take 1 Tablet By Mouth Two Times A Day 5)  Levothyroxine Sodium 200 Mcg Tabs (Levothyroxine Sodium) .... Take 1 Tablet By Mouth Once A Day 6)  Lovastatin 40 Mg  Tabs (Lovastatin) .... Take 1 Tab By Mouth At Bedtime 7)  Oxybutynin Chloride 5 Mg Tabs (Oxybutynin Chloride) .... One Tab By Mouth Three Times A Day 8)  Terazosin Hcl 5 Mg Caps (Terazosin Hcl) .... One Cap By Mouth Once Daily 9)  Fluoxetine Hcl 10 Mg Caps (Fluoxetine Hcl) .... Take 1 Tablet By Mouth Once A Day 10)  Humulin 70/30 70-30 % Susp (Insulin Isophane & Regular) .... 5 Units Two Times A Day  Allergies (verified): 1)  ! Pcn  Review of Systems      See HPI Eyes:  Complains of eye pain and red eye; denies blurring and discharge. Endo:  Complains of cold intolerance, excessive thirst, and excessive urination; denies weight change; not testing regularly despite repeated requests to do so. Heme:  Denies abnormal bruising and bleeding. Allergy:  Complains of seasonal allergies; mild to moderate esp Spring and Fall, uses oTC meds.  Physical Exam  General:  alert, well-hydrated, and overweight-appearing. HEENT: No facial asymmetry,  EOMI, No sinus tenderness, TM's Clear,  oropharynx  pink and moist.   Chest: Clear to auscultation bilaterally.  CVS: S1, S2, No murmurs, No S3.   Abd: Soft, Nontender.  MS: decreased ROM spine, hips, shoulders and knees.  Ext: No edema.   CNS: CN 2-12 intact, power tone normal throughout.sensation decreased in glove and stocking distribution.   Skin: 2healed ulcer, hyperpigmentation of skin on legs Psych: Good eye contact, normal affect.  Memory loss, not anxious or depressed appearing.     Diabetes Management Exam:    Foot Exam (with socks and/or shoes not present):       Sensory-Monofilament:          Left foot: diminished          Right foot: diminished       Inspection:          Left foot:  normal          Right foot: normal       Nails:          Left foot: thickened          Right foot: thickened   Impression & Recommendations:  Problem # 1:  NUMBNESS (ICD-782.0) Assessment Unchanged  Problem # 2:  IDDM (ICD-250.01) Assessment: Deteriorated  His updated medication list for this problem includes:    Aspirin 81 Mg Tbec (Aspirin) .Marland Kitchen... Take 1 tablet by mouth once a day    Glipizide 10 Mg Tb24 (Glipizide) .Marland Kitchen... Take 1 tablet by mouth two times a day    Humulin 70/30 70-30 % Susp (Insulin isophane & regular) .Marland KitchenMarland KitchenMarland KitchenMarland Kitchen 5 units two times a day i WILL SOLICIT THE HELP OF HIS WIFE BY A CALL FROM NURSING AND TRY TO HAVE HIM RESUME A DIARY AND RETURN WITH HER IN 6 WEEKS FOR REVIEW  Orders: T- Hemoglobin A1C [id number]) Glucose, (CBG) [id number]) Insulin 5 units [id number]) Admin of Therapeutic Inj  intramuscular or subcutaneous [id number])  Labs Reviewed: Creat: 1.66 (01/01/2010)    Reviewed HgBA1c results: 10.5 (01/01/2010)  11.2 (11/26/2009)  Problem # 3:  HYPOTHYROIDISM (ICD-244.9) Assessment: Comment Only  His updated medication list for this problem includes:    Levothyroxine Sodium 200 Mcg Tabs (Levothyroxine sodium) .Marland Kitchen... Take 1 tablet by mouth once a day  Labs Reviewed: TSH: 19.813 (01/01/2010)    HgBA1c: 10.5 (01/01/2010) Chol: 218 (01/01/2010)   HDL: 79 (01/01/2010)   LDL: 124 (01/01/2010)   TG: 73 (01/01/2010)  Problem # 4:  DEPRESSION (ICD-311) Assessment: Improved  The following medications were removed from the medication list:    Fluoxetine Hcl 10 Mg Caps (Fluoxetine hcl) .Marland Kitchen... Take 1 tablet by mouth once a day  Problem # 5:  HYPERTENSION (ICD-401.9) Assessment: Deteriorated  His updated medication list for this problem includes:    Furosemide 20 Mg Tabs (Furosemide) .Marland Kitchen... Take 1 tablet by mouth once a day    Amlodipine Besylate 5 Mg Tabs (Amlodipine besylate) .Marland Kitchen... Take 1 tablet by mouth once a day    Terazosin Hcl 5 Mg Caps (Terazosin hcl) ..... One cap  by mouth once daily  Orders: T-Basic Metabolic Panel ([id number])  BP today: 150/90 Prior BP: 128/72 (01/01/2010)  Labs Reviewed: K+: 4.4 (01/01/2010) Creat: : 1.66 (01/01/2010)   Chol: 218 (01/01/2010)   HDL: 79 (01/01/2010)   LDL: 124 (01/01/2010)   TG: 73 (01/01/2010)  Problem # 6:  HYPERLIPIDEMIA (ICD-272.4) Assessment: Comment Only  His updated medication list for this problem includes:    Lovastatin 40 Mg Tabs (Lovastatin) .Marland Kitchen... Take 1 tab by mouth  at bedtime  Labs Reviewed: SGOT: 20 (01/01/2010)   SGPT: 13 (01/01/2010)   HDL:79 (01/01/2010), 62 (04/16/2009)  LDL:124 (01/01/2010), 126 (04/16/2009)  Chol:218 (01/01/2010), 204 (04/16/2009)  Trig:73 (01/01/2010), 78 (04/16/2009)  Complete Medication List: 1)  Furosemide 20 Mg Tabs (Furosemide) .... Take 1 tablet by mouth once a day 2)  Amlodipine Besylate 5 Mg Tabs (Amlodipine besylate) .... Take 1 tablet by mouth once a day 3)  Aspirin 81 Mg Tbec (Aspirin) .... Take 1 tablet by mouth once a day 4)  Glipizide 10 Mg Tb24 (Glipizide) .... Take 1 tablet by mouth two times a day 5)  Levothyroxine Sodium 200 Mcg Tabs (Levothyroxine sodium) .... Take 1 tablet by mouth once a day 6)  Lovastatin 40 Mg Tabs (Lovastatin) .... Take 1 tab by mouth at bedtime 7)  Oxybutynin Chloride 5 Mg Tabs (Oxybutynin chloride) .... One tab by mouth three times a day 8)  Terazosin Hcl 5 Mg Caps (Terazosin hcl) .... One cap by mouth once daily 9)  Humulin 70/30 70-30 % Susp (Insulin isophane & regular) .... 5 units two times a day  Other Orders: T-TSH [id number])  Patient Instructions: 1)  f/u in 6 weeks. 2)  it is imptthat you keep up with your meds. 3)  TSH prior to visit, ICD-9: 4)  BMP prior to visit, ICD-9: 5)  HbgA1C prior to visit, ICD-9: 6)  pls get the glipizide, thyroid med and amlodipine today 7)  Stop fluoxetine Prescriptions: LEVOTHYROXINE SODIUM 200 MCG TABS (LEVOTHYROXINE SODIUM) Take 1 tablet by mouth once a day  #30 Each x 3    Entered by:   Kate Sable LPN   Authorized by:   Tula Nakayama MD   Signed by:   Kate Sable LPN on 624THL   Method used:   Historical   [id number] AMLODIPINE BESYLATE 5 MG  TABS (AMLODIPINE BESYLATE) Take 1 tablet by mouth once a day  #30 x 3   Entered by:   Kate Sable LPN   Authorized by:   Tula Nakayama MD   Signed by:   Kate Sable LPN on 624THL   Method used:   Historical   [id number]   Laboratory Results   Blood Tests     Glucose (random): 253 mg/dL   (Normal Range: 70-105)       Medication Administration  Injection # 1:    Medication: Insulin 5 units    Diagnosis: IDDM (ICD-250.01)    Route: SQ    Site: L deltoid    Exp Date: 01/2012    Lot #: [id number]    Mfr: nordisk    Comments: 3 units given     Patient tolerated injection without complications    Given by: Kate Sable LPN (March 30, 624THL [id number] AM)  Orders Added: 1)  Est. Patient Level IV [id number] 2)  T-Basic Metabolic Panel 0000000 3)  T-TSH [id number] 4)  T- Hemoglobin A1C [83036-23375] 5)  Glucose, (CBG) [82962] 6)  Insulin 5 units [J1815] 7)  Admin of Therapeutic Inj  intramuscular or subcutaneous [id number]

## 2011-01-06 NOTE — Assessment & Plan Note (Signed)
Summary: OV   Vital Signs:  Patient profile:   77 year old male Height:      66.5 inches (168.91 cm) Weight:      206.19 pounds (93.72 kg) BMI:     32.90 O2 Sat:      97 % Pulse rate:   61 / minute Pulse rhythm:   regular Resp:     16 per minute BP sitting:   120 / 70  (left arm)  Vitals Entered By: Kate Sable (Apr 16, 2009 12:58 PM)  Nutrition Counseling: Patient's BMI is greater than 25 and therefore counseled on weight management options. CC: Follow up chronic problems, having some pain in his right shoulder and neck area that has been hurting for a long time Is Patient Diabetic? Yes   CC:  Follow up chronic problems and having some pain in his right shoulder and neck area that has been hurting for a long time.  History of Present Illness: Patient reports doing well.  Denies any recent fever or chills.  Denies any appetite change or change in bowel movements. Patient denies depression, anxiety or insomnia. the pt has not been using lantus as he is unable to self administer. He has progressive weakness and numbness of his fingers and hands. He is actually in primarily because he was recently ticketed for not using his seatbelt, and he needs a doctor'es note to say that he ius unable, which he really is. i  Current Medications (verified): 1)  Furosemide 20 Mg  Tabs (Furosemide) .... Take 1 Tablet By Mouth Once A Day 2)  Amlodipine Besylate 5 Mg  Tabs (Amlodipine Besylate) .... Take 1 Tablet By Mouth Once A Day 3)  Aspirin 81 Mg  Tbec (Aspirin) .... Take 1 Tablet By Mouth Once A Day 4)  Lantus 100 Unit/ml  Soln (Insulin Glargine) .... Inject 50units Sub Q Once Daily 5)  Glipizide 10 Mg  Tb24 (Glipizide) .... Take 1 Tablet By Mouth Two Times A Day 6)  Levothyroxine Sodium 175 Mcg  Tabs (Levothyroxine Sodium) .... Take 1 Tablet By Mouth Mon-Thurs and One and Half Tabs On Fri,sat, and Sun 7)  Lovastatin 40 Mg  Tabs (Lovastatin) .... Take 1 Tab By Mouth At Bedtime 8)  Oxybutynin  Chloride 5 Mg Tabs (Oxybutynin Chloride) .... One Tab By Mouth Three Times A Day 9)  Terazosin Hcl 5 Mg Caps (Terazosin Hcl) .... One Cap By Mouth Once Daily 10)  Fluoxetine Hcl 10 Mg Caps (Fluoxetine Hcl) .... Take 1 Tablet By Mouth Once A Day  Allergies (verified): 1)  ! Pcn  Review of Systems General:  Denies chills and fever. ENT:  Denies hoarseness, nasal congestion, and sinus pressure. CV:  Denies chest pain or discomfort, palpitations, and swelling of feet. Resp:  Denies cough, sputum productive, and wheezing. GI:  Denies abdominal pain, constipation, diarrhea, nausea, and vomiting. GU:  Denies dysuria and urinary frequency. MS:  Complains of joint pain, low back pain, mid back pain, and stiffness. Derm:  Denies itching, lesion(s), and rash; states his ulcers are healed currently on his legs. Neuro:  Complains of tingling and weakness; progressive in hands. Psych:  Complains of anxiety and depression; denies suicidal thoughts/plans, thoughts of violence, and unusual visions or sounds. Endo:  Complains of excessive thirst and excessive urination. Heme:  Denies abnormal bruising and bleeding. Allergy:  Complains of seasonal allergies.  Physical Exam  General:  alert, well-hydrated, and overweight-appearing. HEENT: No facial asymmetry,  EOMI, No sinus tenderness, TM's Clear,  oropharynx  pink and moist.   Chest: Clear to auscultation bilaterally.  CVS: S1, S2, No murmurs, No S3.   Abd: Soft, Nontender.  MS: decreased ROM spine, hips, shoulders and knees.  Ext: No edema.   CNS: CN 2-12 intact, power tone and sensation decreased in upper ext and hands, also decreased sensatiom in feet.  Skin: Intact, no visible lesions or rashes.  Psych: Good eye contact, normal affect.  Memory intact, not anxious or depressed appearing.      Impression & Recommendations:  Problem # 1:  IDDM (ICD-250.01) Assessment Improved  The following medications were removed from the medication list:     Lantus 100 Unit/ml Soln (Insulin glargine) ..... Inject 50units sub q once daily His updated medication list for this problem includes:    Aspirin 81 Mg Tbec (Aspirin) .Marland Kitchen... Take 1 tablet by mouth once a day    Glipizide 10 Mg Tb24 (Glipizide) .Marland Kitchen... Take 1 tablet by mouth two times a day    Onglyza 5 Mg Tabs (Saxagliptin hcl) .Marland Kitchen... Take 1 tablet by mouth once a day  Labs Reviewed: Creat: 1.33 (06/13/2008)    Reviewed HgBA1c results: 8.9 (04/16/2009)  9.6 (11/14/2008)  Problem # 2:  HYPOTHYROIDISM (ICD-244.9) Assessment: Comment Only  His updated medication list for this problem includes:    Levothyroxine Sodium 200 Mcg Tabs (Levothyroxine sodium) .Marland Kitchen... Take 1 tablet by mouth once a day  Labs Reviewed: TSH: 11.023 (12/27/2008)    HgBA1c: 8.9 (04/16/2009) Chol: 178 (06/13/2008)   HDL: 68 (06/13/2008)   LDL: 100 (06/13/2008)   TG: 48 (06/13/2008)  Problem # 3:  DEPRESSION (ICD-311) Assessment: Improved  His updated medication list for this problem includes:    Fluoxetine Hcl 10 Mg Caps (Fluoxetine hcl) .Marland Kitchen... Take 1 tablet by mouth once a day  Problem # 4:  HYPERTENSION (ICD-401.9) Assessment: Unchanged  His updated medication list for this problem includes:    Furosemide 20 Mg Tabs (Furosemide) .Marland Kitchen... Take 1 tablet by mouth once a day    Amlodipine Besylate 5 Mg Tabs (Amlodipine besylate) .Marland Kitchen... Take 1 tablet by mouth once a day    Terazosin Hcl 5 Mg Caps (Terazosin hcl) ..... One cap by mouth once daily  BP today: 120/70 Prior BP: 110/80 (12/27/2008)  Labs Reviewed: K+: 4.1 (06/13/2008) Creat: : 1.33 (06/13/2008)   Chol: 178 (06/13/2008)   HDL: 68 (06/13/2008)   LDL: 100 (06/13/2008)   TG: 48 (06/13/2008)  Problem # 5:  HYPERLIPIDEMIA (ICD-272.4) Assessment: Comment Only  His updated medication list for this problem includes:    Lovastatin 40 Mg Tabs (Lovastatin) .Marland Kitchen... Take 1 tab by mouth at bedtime  Labs Reviewed: SGOT: 18 (06/13/2008)   SGPT: 16 (06/13/2008)    HDL:68 (06/13/2008), 70 (11/23/2007)  LDL:100 (06/13/2008), 82 (11/23/2007)  Chol:178 (06/13/2008), 168 (11/23/2007)  Trig:48 (06/13/2008), 79 (11/23/2007)  Complete Medication List: 1)  Furosemide 20 Mg Tabs (Furosemide) .... Take 1 tablet by mouth once a day 2)  Amlodipine Besylate 5 Mg Tabs (Amlodipine besylate) .... Take 1 tablet by mouth once a day 3)  Aspirin 81 Mg Tbec (Aspirin) .... Take 1 tablet by mouth once a day 4)  Glipizide 10 Mg Tb24 (Glipizide) .... Take 1 tablet by mouth two times a day 5)  Levothyroxine Sodium 200 Mcg Tabs (Levothyroxine sodium) .... Take 1 tablet by mouth once a day 6)  Lovastatin 40 Mg Tabs (Lovastatin) .... Take 1 tab by mouth at bedtime 7)  Oxybutynin Chloride 5 Mg Tabs (Oxybutynin chloride) .Marland KitchenMarland KitchenMarland Kitchen  One tab by mouth three times a day 8)  Terazosin Hcl 5 Mg Caps (Terazosin hcl) .... One cap by mouth once daily 9)  Fluoxetine Hcl 10 Mg Caps (Fluoxetine hcl) .... Take 1 tablet by mouth once a day 10)  Onglyza 5 Mg Tabs (Saxagliptin hcl) .... Take 1 tablet by mouth once a day  Other Orders: Glucose, (CBG) (82962) Hemoglobin A1C [id number])  Patient Instructions: 1)  Please schedule a follow-up appointment in 3 months. 2)  It is important that you exercise regularly at least 20 minutes 5 times a week. If you develop chest pain, have severe difficulty breathing, or feel very tired , stop exercising immediately and seek medical attention. 3)  You need to lose weight. Consider a lower calorie diet and regular exercise.  Prescriptions: ONGLYZA 5 MG TABS (SAXAGLIPTIN HCL) Take 1 tablet by mouth once a day  #90 x 3   Entered and Authorized by:   Tula Nakayama MD   Signed by:   Tula Nakayama MD on 04/19/2009   Method used:   Historical   [id number]   Laboratory Results   Blood Tests   Date/Time Received: Apr 16, 2009 1pm Date/Time Reported: Apr 16, 2009 1pm  Glucose (random): 214 mg/dL   (Normal Range: 70-105) HGBA1C: 8.9%   (Normal Range:  Non-Diabetic - 3-6%   Control Diabetic - 6-8%)

## 2011-01-06 NOTE — Progress Notes (Signed)
Summary: [place]   Imported By: Dierdre Harness 12/13/2009 09:52:49  _____________________________________________________________________  External Attachment:    Type:   Image     Comment:   External Document

## 2011-01-06 NOTE — Assessment & Plan Note (Signed)
Summary: office visit   Vital Signs:  Patient Profile:   77 Years Old Male Height:     67.5 inches Weight:      203.19 pounds BMI:     31.47 Pulse rate:   67 / minute Resp:     16 per minute BP sitting:   128 / 78  (right arm)  Pt. in pain?   no  Vitals Entered By: Kate Sable (September 12, 2008 11:22 AM)              CBG Result 234     Chief Complaint:  Follow up.  History of Present Illness: Pt. reports that his sugar continues to fluctuate he does not take his meds as prescribed he often does not take his pm glipizide espescially when he goes out and sometimes misses his lantus.  He reports that his fasting sugars avg about 125.He denies fatigue, polyuria or polydypsia, and has no hypoglycemic episodes. The pt. has had no recent fever or chills.. He denies depression, anxiety or insomnia.    Current Allergies: ! PCN     Review of Systems  ENT      Denies earache, hoarseness, nasal congestion, sinus pressure, and sore throat.  CV      Denies chest pain or discomfort, palpitations, shortness of breath with exertion, and swelling of feet.  Resp      Denies cough, shortness of breath, sputum productive, and wheezing.  GI      Denies abdominal pain, constipation, diarrhea, nausea, and vomiting.  GU      Denies dysuria, urinary frequency, and urinary hesitancy.  MS      Complains of joint pain, muscle weakness, and stiffness.   Physical Exam  General:     overweight-appearing.   Head:     Normocephalic and atraumatic without obvious abnormalities. No apparent alopecia or balding. Eyes:     vision grossly intact.   Ears:     External ear exam shows no significant lesions or deformities.  Otoscopic examination reveals clear canals, tympanic membranes are intact bilaterally without bulging, retraction, inflammation or discharge. Hearing is grossly normal bilaterally. Nose:     no external erythema and no nasal discharge.   Mouth:     poor dentition.    Neck:     No deformities, masses, or tenderness noted. Lungs:     Normal respiratory effort, chest expands symmetrically. Lungs are clear to auscultation, no crackles or wheezes. Heart:     Normal rate and regular rhythm. S1 and S2 normal without gallop, murmur, click, rub or other extra sounds. Abdomen:     soft and non-tender.   Extremities:     no edema Skin:     chronic venous stasis of both lower extremities. No active ulceration. Cervical Nodes:     No lymphadenopathy noted Psych:     Cognition and judgment appear intact. Alert and cooperative with normal attention span and concentration. No apparent delusions, illusions, hallucinations    Impression & Recommendations:  Problem # 1:  IDDM (ICD-250.01) Assessment: Unchanged  His updated medication list for this problem includes:    Aspirin 81 Mg Tbec (Aspirin) .Marland Kitchen... Take 1 tablet by mouth once a day    Lantus 100 Unit/ml Soln (Insulin glargine) ..... Inject 50units sub q once daily    Glipizide 10 Mg Tb24 (Glipizide) .Marland Kitchen... Take 1 tablet by mouth two times a day  Orders: Glucose, (CBG) [id number])   Problem # 2:  HYPERTENSION (ICD-401.9) Assessment:  Unchanged  His updated medication list for this problem includes:    Furosemide 20 Mg Tabs (Furosemide) .Marland Kitchen... Take 1 tablet by mouth once a day    Amlodipine Besylate 5 Mg Tabs (Amlodipine besylate) .Marland Kitchen... Take 1 tablet by mouth once a day    Terazosin Hcl 5 Mg Caps (Terazosin hcl) ..... One cap by mouth once daily    Amlodipine Besylate 5 Mg Tabs (Amlodipine besylate) ..... One tab by mouth once daily   Problem # 3:  OBESITY (ICD-278.00) Assessment: Unchanged  Problem # 4:  HYPOTHYROIDISM (ICD-244.9) Assessment: Improved  His updated medication list for this problem includes:    Levothyroxine Sodium 175 Mcg Tabs (Levothyroxine sodium) .Marland Kitchen... Take 1 tablet by mouth once a day  Labs Reviewed: TSH: 0.931 (06/13/2008)    HgBA1c: 8.3 (08/21/2008) Chol: 178 (06/13/2008)    HDL: 68 (06/13/2008)   LDL: 100 (06/13/2008)   TG: 48 (06/13/2008)   Complete Medication List: 1)  Furosemide 20 Mg Tabs (Furosemide) .... Take 1 tablet by mouth once a day 2)  Amlodipine Besylate 5 Mg Tabs (Amlodipine besylate) .... Take 1 tablet by mouth once a day 3)  Aspirin 81 Mg Tbec (Aspirin) .... Take 1 tablet by mouth once a day 4)  Flomax 0.4 Mg Cp24 (Tamsulosin hcl) .... Take two tablets by mouth at bedtime 5)  Lantus 100 Unit/ml Soln (Insulin glargine) .... Inject 50units sub q once daily 6)  Glipizide 10 Mg Tb24 (Glipizide) .... Take 1 tablet by mouth two times a day 7)  Levothyroxine Sodium 175 Mcg Tabs (Levothyroxine sodium) .... Take 1 tablet by mouth once a day 8)  Glucosamine 1500 Complex Caps (Glucosamine-chondroit-vit c-mn) .... Take one to two tablets by mouth daily 9)  Lovastatin 40 Mg Tabs (Lovastatin) .... Take 1 tab by mouth at bedtime 10)  Darvocet-n 100 100-650 Mg Tabs (Propoxyphene n-apap) .... Take 1 tablet by mouth two times a day as needed 11)  Aleve 220 Mg Tabs (Naproxen sodium) .... Take two tablets by mouth daily as needed 12)  Oxybutynin Chloride 5 Mg Tabs (Oxybutynin chloride) .... One tab by mouth three times a day 13)  Terazosin Hcl 5 Mg Caps (Terazosin hcl) .... One cap by mouth once daily 14)  Amlodipine Besylate 5 Mg Tabs (Amlodipine besylate) .... One tab by mouth once daily   Patient Instructions: 1)  Follow up mid December. 2)  PLease take your meds for diabetes and all your illnesses as prescribed.   ] Laboratory Results   Blood Tests   Date/Time Received: September 12, 2008 11:30a Date/Time Reported: September 12, 2008  11:30a  CBG Random:: 234mg /dL

## 2011-01-06 NOTE — Progress Notes (Signed)
Summary: [facility]   Imported By: Dierdre Harness 11/18/2009 10:42:48  _____________________________________________________________________  External Attachment:    Type:   Image     Comment:   External Document

## 2011-01-06 NOTE — Progress Notes (Signed)
Summary: medicine  Phone Note Call from Patient   Summary of Call: since he came back from from his visit he can not find his medicine for his sugar what can they do  call back at [phone] Initial call taken by: Dierdre Harness,  November 22, 2009 11:30 AM  Follow-up for Phone Call        pls call pharmacy for early fill on the glipizide and remind them he has been prescribed a new type of insulin also Follow-up by: Tula Nakayama MD,  November 22, 2009 12:50 PM  Additional Follow-up for Phone Call Additional follow up Details #1::        advised wife message sent to pharmacy to fill glipizide early and also start new insulin Additional Follow-up by: Jimmey Ralph LPN,  December 17, 624THL 1:52 PM    Prescriptions: GLIPIZIDE 10 MG  TB24 (GLIPIZIDE) Take 1 tablet by mouth two times a day  #60 Each x 3   Entered by:   Jimmey Ralph LPN   Authorized by:   Tula Nakayama MD   Signed by:   Jimmey Ralph LPN on 075-GRM   Method used:   Electronically to        [facility] (retail)       [address]       [place], [place]  [address]       Ph: [phone]       Fax: [phone]   RxID:   [id number]

## 2011-01-06 NOTE — Letter (Signed)
Summary: [facility] Lenore Cordia Dr. Luiz Ochoa   Imported By: Laverle Patter 03/14/2008 10:33:09  _____________________________________________________________________  External Attachment:    Type:   Image     Comment:   [facility] Lu[facility] Ocho[facility]

## 2011-01-06 NOTE — Progress Notes (Signed)
Summary: LEG FLARED  Phone Note Call from Patient   Summary of Call: LEG FLARED OUT AGAIN AND NEEDS TO GO BACK TO THE WOUND CENTER DO U NEED TO REFER HIM OR CAN THEY CALL WOUND CARE #  DR. NICHOLS   [id number] Initial call taken by: Dierdre Harness,  September 28, 2008 8:45 AM  Follow-up for Phone Call        pls refer eval and treat diabetic leg ulcer twice weekly x 4 weeks at [facility] physical therapy, hehas been before Follow-up by: Tula Nakayama MD,  September 28, 2008 10:53 AM  Additional Follow-up for Phone Call Additional follow up Details #1::        patient is requesting the wound center Additional Follow-up by: Dierdre Harness,  October 01, 2008 10:43 AM    Additional Follow-up for Phone Call Additional follow up Details #2::    ok to refer to wound center, but the last time the said he should go to [facility]. Follow-up by: Tula Nakayama MD,  October 01, 2008 12:33 PM  Additional Follow-up for Phone Call Additional follow up Details #3:: Details for Additional Follow-up Action Taken: pt was referred to wound care in [place]. faxed over information  Additional Follow-up by: Lenn Cal,  October 01, 2008 3:23 PM

## 2011-01-06 NOTE — Letter (Signed)
Summary: [facility]  [facility]   Imported By: Dierdre Harness 01/30/2010 09:36:33  _____________________________________________________________________  External Attachment:    Type:   Image     Comment:   External Document

## 2011-01-06 NOTE — Miscellaneous (Signed)
  Clinical Lists Changes  Medications: Added new medication of LOVASTATIN 40 MG TABS (LOVASTATIN) Take two tablets by mouth at bedtime - Signed Removed medication of LOVASTATIN 40 MG  TABS (LOVASTATIN) Take 1 tab by mouth at bedtime Rx of LOVASTATIN 40 MG TABS (LOVASTATIN) Take two tablets by mouth at bedtime;  #60 x 3;  Signed;  Entered by: Tula Nakayama MD;  Authorized by: Tula Nakayama MD;  Method used: Historical    Prescriptions: LOVASTATIN 40 MG TABS (LOVASTATIN) Take two tablets by mouth at bedtime  #60 x 3   Entered and Authorized by:   Tula Nakayama MD   Signed by:   Tula Nakayama MD on 08/28/2010   Method used:   Historical   [id number]   Appended Document:  pls stamp and fax with dose inc, also explain to pt   Appended Document:  Prescriptions: LOVASTATIN 40 MG TABS (LOVASTATIN) Take two tablets by mouth at bedtime (dose change)  #60 x 3   Entered by:   Baldomero Lamy LPN   Authorized by:   Tula Nakayama MD   Signed by:   Baldomero Lamy LPN on 624THL   Method used:   Electronically to        [facility] (retail)       [address]       [place], [place]  [address]       Ph: [phone]       Fax: [phone]   RxID:   [id number]

## 2011-01-06 NOTE — Progress Notes (Signed)
  Phone Note Call from Patient   Caller: Patient Summary of Call: wife called tried to refill fluoxetine but last fill was back in april should patient still be on this? Initial call taken by: Baldomero Lamy LPN,  July 26, 624THL [id number] PM  Follow-up for Phone Call        pls refill x 3  Follow-up by: Tula Nakayama MD,  July 01, 2010 5:21 PM  Additional Follow-up for Phone Call Additional follow up Details #1::        wife aware Additional Follow-up by: Baldomero Lamy LPN,  July 27, 624THL [id number] AM    New/Updated Medications: FLUOXETINE HCL 10 MG CAPS (FLUOXETINE HCL) one cap by mouth once daily Prescriptions: FLUOXETINE HCL 10 MG CAPS (FLUOXETINE HCL) one cap by mouth once daily  #30 x 2   Entered by:   Baldomero Lamy LPN   Authorized by:   Tula Nakayama MD   Signed by:   Baldomero Lamy LPN on 624THL   Method used:   Electronically to        [facility] (retail)       [address]       [place], [place]  [address]       Ph: [phone]       Fax: [phone]   RxID:   [id number]

## 2011-01-06 NOTE — Letter (Signed)
Summary: Letter  Letter   Imported By: Dierdre Harness 08/28/2009 14:15:00  _____________________________________________________________________  External Attachment:    Type:   Image     Comment:   External Document

## 2011-01-06 NOTE — Progress Notes (Signed)
Summary: Office Visit / [facility]  Office Visit / WOUND CARE/ DR.ROBSON   Imported By: Dierdre Harness 02/04/2009 08:57:44  _____________________________________________________________________  External Attachment:    Type:   Image     Comment:   External Document

## 2011-01-06 NOTE — Progress Notes (Signed)
  Phone Note Call from Patient   Summary of Call: Hartley said that since he can't take the insulin that you were going to give him a tablet that would replace it? I told him that I didn't see anything about it from his OV but that I would check with you and let him know.  Initial call taken by: Kate Sable,  Apr 17, 2009 8:24 AM  Follow-up for Phone Call        yes we are trying to get onglyza 5 mg for him, Roselyn Reef should have called for starter samples, and I would like a std letter typed to be sent to [facility], atating thaat the pt requires the medication for his diabetes, he has weakness and numbness in his hands, is unable to self administer insulin and needs this medication to improve hisblood sugar control. I will sign and i am leaving the request script in your box Follow-up by: Tula Nakayama MD,  Apr 17, 2009 1:11 PM  Additional Follow-up for Phone Call Additional follow up Details #1::        RX sent and patients wife scheduling an appointment for 2 months Additional Follow-up by: Kate Sable,  Apr 17, 2009 1:22 PM

## 2011-01-06 NOTE — Letter (Signed)
Summary: Letter  Letter   Imported By: Dierdre Harness 04/16/2009 16:31:47  _____________________________________________________________________  External Attachment:    Type:   Image     Comment:   External Document

## 2011-01-06 NOTE — Progress Notes (Signed)
Summary: [facility]   Imported By: Dierdre Harness 07/03/2010 14:10:18  _____________________________________________________________________  External Attachment:    Type:   Image     Comment:   External Document

## 2011-01-06 NOTE — Letter (Signed)
Summary: Generic Letter  [facility]  [address] [place]  [place], [place] [address]   Phone: [phone]  Fax: [phone]    01/26/2008  RE: Chase [name]  Present in our office today for appointment for Patient [name]   Return to work 01/26/08           Sincerely,  Demetrius Revel, MD [facility]

## 2011-01-06 NOTE — Assessment & Plan Note (Signed)
Summary: office visit   Vital Signs:  Patient profile:   77 year old male Height:      66.5 inches Weight:      194 pounds BMI:     30.95 O2 Sat:      98 % Pulse rate:   73 / minute Pulse rhythm:   regular Resp:     16 per minute BP sitting:   146 / 80  (left arm) Cuff size:   large  Vitals Entered By: Kate Sable LPN (September 21, 624THL 10:49 AM)  Nutrition Counseling: Patient's BMI is greater than 25 and therefore counseled on weight management options. CC: Follow up chronic problems   Primary Care Provider:  Jacoby Ritsema, md  CC:  Follow up chronic problems.  History of Present Illness: Reports  that  he has been doing fairly well. unfortunately he does not have one of his meds for the past 3 weeks, and cannot remeberthe name of themed. He has also not been checking his sugars regulalrly, and states that when he does they fluctuate alot.   Denies recent fever or chills. Denies sinus pressure, nasal congestion , ear pain or sore throat. Denies chest congestion, or cough productive of sputum. Denies chest pain, palpitations, PND, orthopnea or leg swelling. Denies abdominal pain, nausea, vomitting, diarrhea or constipation. Denies change in bowel movements or bloody stool. Denies dysuria , frequency, incontinence or hesitancy. c/   joint pain,and  reduced mobility. Denies headaches, vertigo, seizures. Denies depression, anxiety or insomnia. Denies  rash, lesions, or itch.     Allergies: 1)  ! Pcn  Review of Systems      See HPI General:  Complains of fatigue. Eyes:  Complains of blurring. MS:  Complains of joint pain and stiffness. Neuro:  Complains of numbness, tingling, and weakness.  Physical Exam  General:  alert, well-hydrated, and overweight-appearing. HEENT: No facial asymmetry,  EOMI, No sinus tenderness, TM's Clear, oropharynx  pink and moist.   Chest: Clear to auscultation bilaterally.  CVS: S1, S2, No murmurs, No S3.   Abd: Soft, Nontender.    MS: decreased ROM spine, hips, shoulders and knees.  Ext: No edema.   CNS: CN 2-12 intact, power tone normal throughout.sensation decreased in glove and stocking distribution.   Skin: 2healed ulcer, hyperpigmentation of skin on legs Psych: Good eye contact, normal affect.  Memory loss, not anxious or depressed appearing.     Diabetes Management Exam:    Foot Exam (with socks and/or shoes not present):       Sensory-Monofilament:          Left foot: diminished          Right foot: diminished       Inspection:          Left foot: normal          Right foot: normal       Nails:          Left foot: thickened          Right foot: thickened   Impression & Recommendations:  Problem # 1:  NUMBNESS (ICD-782.0) Assessment Deteriorated  Problem # 2:  HYPOTHYROIDISM (ICD-244.9) Assessment: Comment Only  His updated medication list for this problem includes:    Levothroid 200 Mcg Tabs (Levothyroxine sodium) ..... One tablet daily monday through friday, half tablet saturday and sunday  Labs Reviewed: TSH: 0.094 (04/22/2010)    HgBA1c: 10.5 (03/05/2010) Chol: 218 (01/01/2010)   HDL: 79 (01/01/2010)   LDL:  124 (01/01/2010)   TG: 73 (01/01/2010)  Problem # 3:  DEPRESSION (ICD-311) Assessment: Improved  His updated medication list for this problem includes:    Fluoxetine Hcl 10 Mg Caps (Fluoxetine hcl) ..... One cap by mouth once daily  Problem # 4:  HYPERTENSION (ICD-401.9) Assessment: Improved  His updated medication list for this problem includes:    Furosemide 20 Mg Tabs (Furosemide) .Marland Kitchen... Take 1 tablet by mouth once a day    Amlodipine Besylate 5 Mg Tabs (Amlodipine besylate) .Marland Kitchen... Take 1 tablet by mouth once a day    Terazosin Hcl 5 Mg Caps (Terazosin hcl) ..... One cap by mouth once daily  Orders: T-Basic Metabolic Panel ([id number])  BP today: 146/80 Prior BP: 160/80 (04/22/2010)  Labs Reviewed: K+: 4.7 (03/05/2010) Creat: : 1.53 (03/05/2010)   Chol: 218  (01/01/2010)   HDL: 79 (01/01/2010)   LDL: 124 (01/01/2010)   TG: 73 (01/01/2010)  Problem # 5:  HYPERLIPIDEMIA (ICD-272.4) Assessment: Comment Only  His updated medication list for this problem includes:    Lovastatin 40 Mg Tabs (Lovastatin) .Marland Kitchen... Take 1 tab by mouth at bedtime Low fat dietdiscussed and encouraged  Orders: T-Hepatic Function [id number]) T-Lipid Profile [phone])  Labs Reviewed: SGOT: 20 (01/01/2010)   SGPT: 13 (01/01/2010)   HDL:79 (01/01/2010), 62 (04/16/2009)  LDL:124 (01/01/2010), 126 (04/16/2009)  Chol:218 (01/01/2010), 204 (04/16/2009)  Trig:73 (01/01/2010), 78 (04/16/2009)  Problem # 6:  IDDM (ICD-250.01) Assessment: Comment Only  His updated medication list for this problem includes:    Aspirin 81 Mg Tbec (Aspirin) .Marland Kitchen... Take 1 tablet by mouth once a day    Glipizide 10 Mg Tb24 (Glipizide) .Marland Kitchen... Take 1 tablet by mouth two times a day Patient advised to reduce carbs and sweets, commit to regular physical activity, take meds as prescribed, test blood sugars as directed, and attempt to lose weight , to improve blood sugar control.  Orders: T- Hemoglobin A1C [id number]) Glucose, (CBG) [id number])  Labs Reviewed: Creat: 1.53 (03/05/2010)    Reviewed HgBA1c results: 10.5 (03/05/2010)  10.5 (01/01/2010)  Complete Medication List: 1)  Furosemide 20 Mg Tabs (Furosemide) .... Take 1 tablet by mouth once a day 2)  Amlodipine Besylate 5 Mg Tabs (Amlodipine besylate) .... Take 1 tablet by mouth once a day 3)  Aspirin 81 Mg Tbec (Aspirin) .... Take 1 tablet by mouth once a day 4)  Glipizide 10 Mg Tb24 (Glipizide) .... Take 1 tablet by mouth two times a day 5)  Lovastatin 40 Mg Tabs (Lovastatin) .... Take 1 tab by mouth at bedtime 6)  Oxybutynin Chloride 5 Mg Tabs (Oxybutynin chloride) .... One tab by mouth three times a day 7)  Terazosin Hcl 5 Mg Caps (Terazosin hcl) .... One cap by mouth once daily 8)  Levothroid 200 Mcg Tabs (Levothyroxine sodium) .... One  tablet daily monday through friday, half tablet saturday and sunday 9)  Fluoxetine Hcl 10 Mg Caps (Fluoxetine hcl) .... One cap by mouth once daily  Other Orders: T-CBC w/Diff [id number]) T-TSH [id number]) Hemoccult Guaiac-1 spec.(in office) ([id number]) Flu Vaccine 6-35 months [id number]) Admin 1st Vaccine [phone]) Admin 1st Vaccine [facility]) [phone])  Patient Instructions: 1)  Please schedule a follow-up appointment in 3 months. 2)  It is important that you exercise regularly at least 20 minutes 5 times a week. If you develop chest pain, have severe difficulty breathing, or feel very tired , stop exercising immediately and seek medical attention. 3)  You need to lose weight. Consider a lower calorie diet and regular exercise.  4)  Check your blood sugars regularly. If your readings are usually above 200 or below 70 you should contact our office. 5)  BMP prior to visit, ICD-9: 6)  Hepatic Panel prior to visit, ICD-9:   fasting today. 7)  Lipid Panel prior to visit, ICD-9: 8)  TSH prior to visit, ICD-9: 9)  CBC w/ Diff prior to visit, ICD-9: 10)  HbgA1C prior to visit, ICD-9:   Laboratory Results   Blood Tests     Glucose (fasting): 218 mg/dL   (Normal Range: 70-105)  Comments: 50201 10l 3/12 118 10/12      Influenza Vaccine    Vaccine Type: Fluvax 6-23mos    Site: left deltoid    Mfr: novartis     Dose: 0.5 ml    Route: IM    Given by: Kate Sable LPN    Exp. Date: 04/2011    Lot #: [id number]

## 2011-01-06 NOTE — Progress Notes (Signed)
Summary: referral  Phone Note Call from Patient   Caller: Spouse Summary of Call: wife states when patient uses insulin, vision gets blurry  [phone] Initial call taken by: Baldomero Lamy LPN,  April 19, 624THL 1:59 PM  Follow-up for Phone Call        pls find out what his blood sugar readings have been recently. Follow-up by: Kennith Gain PA,  March 25, 2010 3:52 PM  Additional Follow-up for Phone Call Additional follow up Details #1::        165, 166, 171 Additional Follow-up by: Baldomero Lamy LPN,  April 19, 624THL 3:57 PM    Additional Follow-up for Phone Call Additional follow up Details #2::    Pt states that  after he uses his insulin he gets blurred vision.  This happens approx 1 hr after, but sometimes right away, and lasts "awhile."  Pt states he stopped using the insulin & hasn't had any blurred vision since. His last eye exam was 2-3 mos ago & per pt was nl.  Follow-up by: Kennith Gain PA,  March 26, 2010 9:19 AM  Additional Follow-up for Phone Call Additional follow up Details #3:: Details for Additional Follow-up Action Taken: i suggest hesees a specialisrt for his sugars with all the problems he is having . if he agrees , the nearest is in [place], and I am happy to refer him Additional Follow-up by: Tula Nakayama MD,  March 26, 2010 12:18 PM  okay to refer per wife Baldomero Lamy LPN  April 20, 624THL 075-GRM PM  [pt has appt with dr. Loletta Specter for 04/07/2010 12:30. left message for pt about his appt and also for him to call me back. Lenn Cal  March 31, 2010 1:44 PM

## 2011-01-06 NOTE — Progress Notes (Signed)
Summary: DR.CODY DRAKE  DR.CODY DRAKE   Imported By: Dierdre Harness 01/30/2010 09:44:39  _____________________________________________________________________  External Attachment:    Type:   Image     Comment:   External Document

## 2011-01-06 NOTE — Assessment & Plan Note (Signed)
Summary: office visit   Vital Signs:  Patient profile:   77 year old male Height:      66.5 inches Weight:      205.25 pounds BMI:     32.75 O2 Sat:      93 % Pulse rate:   80 / minute Pulse rhythm:   regular Resp:     16 per minute BP sitting:   120 / 80  Vitals Entered By: Kate Sable (November 20, 2009 11:17 AM)  Nutrition Counseling: Patient's BMI is greater than 25 and therefore counseled on weight management options. CC: states his balance has been off for a pretty good while now Is Patient Diabetic? Yes   Primary Care Provider:  Olajuwon Fosdick, md  CC:  states his balance has been off for a pretty good while now.  History of Present Illness: pt reports that he has been doing fairly well. He has been succesfully treated over the past several weeks for a left leg ulcer.  He has not been testiong his sugars regularly and has not been taking his lantus states it is too expensive,, unfortunately today's HBA1C is over 11.He denies any symptoms of excessive thirs or polyuria, he does state that his vision seems to be deteriorating. He also has concerns about worsening loss of sensation in hands and feet, and also worsening gait instability.    Preventive Screening-Counseling & Management  Alcohol-Tobacco     Smoking Status: never  Current Medications (verified): 1)  Furosemide 20 Mg  Tabs (Furosemide) .... Take 1 Tablet By Mouth Once A Day 2)  Amlodipine Besylate 5 Mg  Tabs (Amlodipine Besylate) .... Take 1 Tablet By Mouth Once A Day 3)  Aspirin 81 Mg  Tbec (Aspirin) .... Take 1 Tablet By Mouth Once A Day 4)  Glipizide 10 Mg  Tb24 (Glipizide) .... Take 1 Tablet By Mouth Two Times A Day 5)  Levothyroxine Sodium 200 Mcg Tabs (Levothyroxine Sodium) .... Take 1 Tablet By Mouth Once A Day 6)  Lovastatin 40 Mg  Tabs (Lovastatin) .... Take 1 Tab By Mouth At Bedtime 7)  Oxybutynin Chloride 5 Mg Tabs (Oxybutynin Chloride) .... One Tab By Mouth Three Times A Day 8)  Terazosin  Hcl 5 Mg Caps (Terazosin Hcl) .... One Cap By Mouth Once Daily 9)  Fluoxetine Hcl 10 Mg Caps (Fluoxetine Hcl) .... Take 1 Tablet By Mouth Once A Day 10)  Onglyza 5 Mg Tabs (Saxagliptin Hcl) .... One Tab On Mon Wed Fri and Sun 11)  Lantus 100 Unit/ml Soln (Insulin Glargine) .... 20 Units Daily  Allergies (verified): 1)  ! Pcn  Review of Systems      See HPI General:  Complains of fatigue and malaise; denies chills and fever. Eyes:  See HPI; Denies eye pain and red eye. ENT:  Denies earache, hoarseness, nasal congestion, sinus pressure, and sore throat. CV:  Denies chest pain or discomfort, palpitations, and swelling of feet. Resp:  Denies cough and sputum productive. GI:  Denies abdominal pain, constipation, diarrhea, nausea, and vomiting. GU:  Denies dysuria, urinary frequency, and urinary hesitancy. MS:  Complains of joint pain, low back pain, mid back pain, and stiffness. Derm:  Denies itching, lesion(s), and rash. Neuro:  Complains of disturbances in coordination, memory loss, numbness, poor balance, sensation of room spinning, visual disturbances, and weakness; denies headaches and seizures. Psych:  Complains of anxiety and depression; denies suicidal thoughts/plans, thoughts of violence, and unusual visions or sounds; improved on medication. Endo:  See HPI.  Heme:  Denies abnormal bruising and bleeding. Allergy:  Complains of seasonal allergies; denies hives or rash.  Physical Exam  General:  alert, well-hydrated, and overweight-appearing. HEENT: No facial asymmetry,  EOMI, No sinus tenderness, TM's Clear, oropharynx  pink and moist.   Chest: Clear to auscultation bilaterally.  CVS: S1, S2, No murmurs, No S3.   Abd: Soft, Nontender.  MS: decreased ROM spine, hips, shoulders and knees.  Ext: No edema.   CNS: CN 2-12 intact, power tone normal throughout.sensation decreased in glove and stocking distribution.   Skin: 2healed ulcer, hyperpigmentation of skin on legs Psych: Good  eye contact, normal affect.  Memory loss, not anxious or depressed appearing.     Diabetes Management Exam:    Foot Exam (with socks and/or shoes not present):       Sensory-Monofilament:          Left foot: diminished          Right foot: diminished       Inspection:          Left foot: normal          Right foot: normal       Nails:          Left foot: thickened          Right foot: thickened   Impression & Recommendations:  Problem # 1:  UNSTEADY GAIT (ICD-781.2) Assessment Deteriorated  Orders: Neurology Referral (Neuro)  Problem # 2:  NUMBNESS (ICD-782.0) Assessment: Deteriorated  Orders: Neurology Referral (Neuro) T-Anemia Panel 3  (2904)  Problem # 3:  DIABETIC ULCER, LEFT LEG (ICD-250.80) Assessment: Improved  The following medications were removed from the medication list:    Onglyza 5 Mg Tabs (Saxagliptin hcl) ..... One tab on mon wed fri and sun    Lantus 100 Unit/ml Soln (Insulin glargine) .Marland Kitchen... 20 units daily His updated medication list for this problem includes:    Aspirin 81 Mg Tbec (Aspirin) .Marland Kitchen... Take 1 tablet by mouth once a day    Glipizide 10 Mg Tb24 (Glipizide) .Marland Kitchen... Take 1 tablet by mouth two times a day    Novolin 70/30 70-30 % Susp (Insulin isophane & regular) .Marland KitchenMarland KitchenMarland KitchenMarland Kitchen 5 units twice daily  Problem # 4:  IDDM (ICD-250.01) Assessment: Deteriorated  The following medications were removed from the medication list:    Onglyza 5 Mg Tabs (Saxagliptin hcl) ..... One tab on mon wed fri and sun    Lantus 100 Unit/ml Soln (Insulin glargine) .Marland Kitchen... 20 units daily His updated medication list for this problem includes:    Aspirin 81 Mg Tbec (Aspirin) .Marland Kitchen... Take 1 tablet by mouth once a day    Glipizide 10 Mg Tb24 (Glipizide) .Marland Kitchen... Take 1 tablet by mouth two times a day    Novolin 70/30 70-30 % Susp (Insulin isophane & regular) .Marland KitchenMarland KitchenMarland KitchenMarland Kitchen 5 units twice daily  Orders: Glucose, (CBG) (82962) Hemoglobin A1C (83036)  Labs Reviewed: Creat: 1.63 (08/15/2009)     Reviewed HgBA1c results: 8.6 (08/15/2009), reportedly over 11 today  8.9 (04/16/2009)  Problem # 5:  DEPRESSION (ICD-311) Assessment: Improved  His updated medication list for this problem includes:    Fluoxetine Hcl 10 Mg Caps (Fluoxetine hcl) .Marland Kitchen... Take 1 tablet by mouth once a day  Problem # 6:  HYPOTHYROIDISM (ICD-244.9) Assessment: Comment Only  His updated medication list for this problem includes:    Levothyroxine Sodium 200 Mcg Tabs (Levothyroxine sodium) .Marland Kitchen... Take 1 tablet by mouth once a day  Orders: T-TSH [id number])  Labs Reviewed: TSH: 18.556 (08/15/2009)    HgBA1c: 8.6 (08/15/2009) Chol: 204 (04/16/2009)   HDL: 62 (04/16/2009)   LDL: 126 (04/16/2009)   TG: 78 (04/16/2009)  Complete Medication List: 1)  Furosemide 20 Mg Tabs (Furosemide) .... Take 1 tablet by mouth once a day 2)  Amlodipine Besylate 5 Mg Tabs (Amlodipine besylate) .... Take 1 tablet by mouth once a day 3)  Aspirin 81 Mg Tbec (Aspirin) .... Take 1 tablet by mouth once a day 4)  Glipizide 10 Mg Tb24 (Glipizide) .... Take 1 tablet by mouth two times a day 5)  Levothyroxine Sodium 200 Mcg Tabs (Levothyroxine sodium) .... Take 1 tablet by mouth once a day 6)  Lovastatin 40 Mg Tabs (Lovastatin) .... Take 1 tab by mouth at bedtime 7)  Oxybutynin Chloride 5 Mg Tabs (Oxybutynin chloride) .... One tab by mouth three times a day 8)  Terazosin Hcl 5 Mg Caps (Terazosin hcl) .... One cap by mouth once daily 9)  Fluoxetine Hcl 10 Mg Caps (Fluoxetine hcl) .... Take 1 tablet by mouth once a day 10)  Novolin 70/30 70-30 % Susp (Insulin isophane & regular) .... 5 units twice daily  Other Orders: T-Basic Metabolic Panel ([id number]) T-Hepatic Function [phone]) T-Lipid Profile [id number]) T-CBC w/Diff [phone])  Patient Instructions: 1)  F/U in 6 weeks. 2)  It is important that you exercise regularly at least 20 minutes 5 times a week. If you develop chest pain, have severe difficulty breathing, or  feel very tired , stop exercising immediately and seek medical attention. 3)  You need to lose weight. Consider a lower calorie diet and regular exercise.  4)  You will be referred to a neurologist about your unsteady gait, and poor sensation, pls keep the appt 5)  Check your blood sugars regularly. If your readings are usually above : or below 70 you should contact our office. 6)  BMP prior to visit, ICD-9: 7)  Hepatic Panel prior to visit, ICD-9: 8)  Lipid Panel prior to visit, ICD-9: 9)  TSH prior to visit, ICD-9:  fasting next week pls 10)  CBC w/ Diff prior to visit, ICD-9: 11)  anemuia panel, numbness Prescriptions: AMLODIPINE BESYLATE 5 MG  TABS (AMLODIPINE BESYLATE) Take 1 tablet by mouth once a day  #30 x 5   Entered by:   Jimmey Ralph LPN   Authorized by:   Tula Nakayama MD   Signed by:   Jimmey Ralph LPN on 624THL   Method used:   Electronically to        [facility] (retail)       [address], [place]  [address]       Ph: [phone]       Fax: [phone]   RxID:   [id number] NOVOLIN 70/30 70-30 % SUSP (INSULIN ISOPHANE & REGULAR) 5 units twice daily  #300 units x 2   Entered and Authorized by:   Tula Nakayama MD   Signed by:   Tula Nakayama MD on 11/20/2009   Method used:   Electronically to        [facility] (retail)       [address]       [place],   [address]       Ph: [phone]       Fax: [phone]  [id number]   Laboratory Results   Blood Tests   Date/Time Received: November 20, 2009  Date/Time Reported: November 20, 2009   Glucose (random): 248 mg/dL   (Normal Range: 70-105)     Appended Document: office visit pls add HBA1C  from ov on 11/20/2009 to the patient's record  Appended Document: office visit  Laboratory Results   Blood Tests   Date/Time Received: 11/20/2009 Date/Time Reported: 11/20/2009  HGBA1C: 11.2%   (Normal  Range: Non-Diabetic - 3-6%   Control Diabetic - 6-8%)

## 2011-01-06 NOTE — Assessment & Plan Note (Signed)
Summary: BILAT KNEE PAIN/NO XR/NO HX BACK SURG/REF [facility]/CAF   Vital Signs:  Patient Profile:   77 Years Old Male Weight:      190 pounds Pulse rate:   66 / minute Resp:     16 per minute  Vitals Entered By: Peter Minium (January 26, 2008 9:04 AM)                 Chief Complaint:  back pain with radiation.  History of Present Illness: I saw Chase [name] in the office today for an initial visit.  He is a 77 years old man with the complaint of:  Back pain with radiculopathy with torn meniscus of right knee.Marland Kitchen REFERRAL [facility].  Patient had MRI of right knee 01-02-08 at [facility] which read tricompartmental osteoarthritis, medial meniscal extrusion with degenerative tearing of posterior horn and body of the medial meniscus, joint effusion with possible loose body in the suprapatellar recess.  MRI of lumbar spine was taken 01-02-08 which read multifactorial spinal stenosis at L4-5, osteophytes, disc protrusion of disc material and hypertrophy, neural compression, stenosis, DDD. His pain starts from lower back to bilateral legs to feet. Pain pills do not help. He has not had any back surgeries, injuries.  He has been having pain for 2 years. He has not had any therapy. He has pain, numbness and tingling down both legs.  Pain level today is around 6, he has achy pain and sometimes sharp pains.  Pain comes and goes depending on how he is positioned at the time.  He has not ahd any injections in back.        Current Allergies: ! PCN  Past Medical History:    Reviewed history and no changes required:       diabetes       thyrid       high blood pressure  Past Surgical History:    Reviewed history and no changes required:       kidney stones   Family History:    Reviewed history and no changes required:       Family History of Diabetes  Social History:    Reviewed history and no changes required:       Patient is married. retired   Risk Factors:   Tobacco use:  never Caffeine use:  2 drinks per day Alcohol use:  yes    Comments:  occasional   Review of Systems  Cardiac      Complains of poor circulation.  GI      Complains of ulcers.  GU      Complains of kidney stones.  Neuro      Complains of numbness, weakness, and unsteady walking.  MS      Complains of joint pain and rheumatoid arthritis.  Endo      Complains of thyroid disease and diabetes.  Derm      Complains of itching.   Physical Exam  Extremities:      The upper extremities have normal appearance, ROM, strength and stability.    Knee Exam  General:    Well-developed, well-nourished, normal body habitus; no deformities, normal grooming.  Gait:    walker for ambulation  Skin:    venous stasis ulcer left medial leg   Inspection:    both knees deformity:   Palpation:    Only mild tenderness both knees   Vascular:    DP was 1+ bilateral venous stasis severe   Sensory:  reflexes are 2+ knees 1+ ankles  sensory exam unreliable   Motor:    Motor strength 5/5 bilaterally for quadriceps, hamstrings, ankle dorsiflexion, and ankle plantar flexion.    Knee Exam:    Right:    Inspection:  Abnormal    Palpation:  Abnormal    Stability:  stable    Swelling:  no    Range of Motion:       Flexion-Passive: 110 degrees       Extension-Passive: -10 degrees    Left:    Inspection:  Abnormal    Palpation:  Abnormal    Stability:  stable    Swelling:  no    Range of Motion:       Flexion-Passive: 115 degrees       Extension-Passive: full    McMurrays negative     Impression & Recommendations:  Problem # 1:  SPINAL STENOSIS (ICD-724.00) mri lumbar and knee at aph; see scanned docs (spinal stenosis; severe djd of the knee) Orders: New Patient Level IV [id number])   Problem # 2:  BACK PAIN (ICD-724.5)  Orders: New Patient Level IV [id number])   Problem # 3:  SPONDYLOSIS (ICD-721.90)  Orders: New Patient Level IV [id number])    Problem # 4:  DEGENERATIVE JOINT DISEASE, KNEE (ICD-715.96) inject knee econ hinge brace - no f/u needed  Verbal consent was obtained. The right knee was prepped with alcohol and ethyl chloride. 1 cc of depomedrol 40mg /cc and 1 cc of sensorcaine .25% was injected. there were no complications.  Orders: New Patient Level IV [id number])   Problem # 5:  DERANGEMENT MENISCUS (ICD-717.5) This gentleman can not be helped here. He needs neurosurgical evaluation-back probably not operable. He is not medically stable enough for surgery. Knee arthroscopy will not help. His primary problem is the leg pain, and the spine is the source of that. Orders: New Patient Level IV [id number])   Problem # 6:  KNEE PAIN (ICD-719.46)  Orders: New Patient Level IV [id number]) Depo- Medrol 40mg  (J1030) Joint Aspirate / Injection, Large (20610)   Medications Added to Medication List This Visit: 1)  Caduet  2)  Synthroid  3)  Glipizide  4)  Furosemide  5)  Enablex  6)  Oxybutynin  7)  Flomax  8)  Lipitor    Patient Instructions: 1)  Neurosurgery Consult for spinal stenosis. 2)  You have received an injection of cortisone today. You may experience increased pain at the injection site. Apply ice pack to the area for 20 minutes every 2 hours and take 2 xtra strength tylenol every 8 hours. This increased pain will usually resolve in 24 hours. The injection will take effect in 3-10 days.     ]

## 2011-01-06 NOTE — Progress Notes (Signed)
Summary[facility]   Imported By: Dierdre Harness 03/14/2009 14:24:14  _____________________________________________________________________  External Attachment:    Type:   Image     Comment:   External Document

## 2011-01-06 NOTE — Progress Notes (Signed)
[facility]   Imported By: Dierdre Harness 10/09/2009 11:27:45  _____________________________________________________________________  External Attachment:    Type:   Image     Comment:   External Document

## 2011-01-06 NOTE — Progress Notes (Signed)
Summary: NEUROSURGEON APPOINTMENT.  Phone Note Outgoing Call   Call placed by: Santo Held,  February 07, 2008 12:44 PM Call placed to: Patient Action Taken: Phone Call Completed, Appt scheduled, Information Sent Reason for Call: Confirm/change Appt Summary of Call: I CALLED AND GAVE PATIENT APPOINTMENT WITH NEUROSURGEON, DR. [place] ON 02-17-08 AT 12:45. PATIENT IS AWARE TO TAKE PRINTED OUT FILMS. Initial call taken by: Santo Held,  February 07, 2008 12:46 PM

## 2011-01-06 NOTE — Progress Notes (Signed)
Summary: blood sugars  Phone Note From Other Clinic   Summary of Call: dr. Caprice Beaver from [facility] from there called and that Cross was in there before lunch left message to call back and  she told me that his sugar was 444 last night and the dr thought you needed to know that and she told him someone would be calling from here call back at [phone] Initial call taken by: Dierdre Harness,  November 11, 2010 1:44 PM  Follow-up for Phone Call        pls call there and see if he has been there i am sure he has and let her know,thanks Follow-up by: Tula Nakayama MD,  November 11, 2010 5:13 PM  Additional Follow-up for Phone Call Additional follow up Details #1::        patient wife states dr Loletta Specter doesnt ring a bell, not sure patient is seeing him Additional Follow-up by: Baldomero Lamy LPN,  December  7, 624THL 1:20 PM    Additional Follow-up for Phone Call Additional follow up Details #2::    if you give her the number to call, she will see he hjas been referred there and has beenthere he needs to se the specialist Follow-up by: Tula Nakayama MD,  November 12, 2010 2:48 PM  patient wife aware

## 2011-01-06 NOTE — Progress Notes (Signed)
Summary: electromyography report  electromyography report   Imported By: Dierdre Harness 12/18/2009 14:07:09  _____________________________________________________________________  External Attachment:    Type:   Image     Comment:   External Document

## 2011-01-06 NOTE — Consult Note (Signed)
Summary: [facility]   Imported By: Dierdre Harness 09/17/2008 09:51:08  _____________________________________________________________________  External Attachment:    Type:   Image     Comment:   External Document

## 2011-01-06 NOTE — Progress Notes (Signed)
[facility]   Imported By: Dierdre Harness 07/15/2010 10:33:10  _____________________________________________________________________  External Attachment:    Type:   Image     Comment:   External Document

## 2011-01-06 NOTE — Progress Notes (Signed)
Summary: rx  Phone Note Call from Patient   Summary of Call: pt is confused on a medicine that has different mg. please call back [phone] Initial call taken by: Lenn Cal,  May 01, 2009 2:35 PM  Follow-up for Phone Call        returned call, no answer Follow-up by: Jimmey Ralph LPN,  May 26, 624THL 579FGE PM  Additional Follow-up for Phone Call Additional follow up Details #1::        Phone Call Completed Additional Follow-up by: Jimmey Ralph LPN,  May 27, 624THL 579FGE AM

## 2011-01-06 NOTE — Assessment & Plan Note (Signed)
Summary: office visit   Vital Signs:  Patient Profile:   77 Years Old Male Height:     67.5 inches Weight:      205.56 pounds BMI:     31.83 Pulse rate:   91 / minute Pulse rhythm:   regular Resp:     16 per minute BP sitting:   120 / 70  (left arm)  Pt. in pain?   no  Vitals Entered By: Jimmey Ralph LPN (September 15, 579FGE 1:37 PM)                  Chief Complaint:  follow up chronic problems.  History of Present Illness: Pt. stopped insulin in August states that itwas falling too low with 50 units of lantus, his record shows his avg. fastngs are averaging 150 to 180. He states that he recently saw his opthalmologist and was told that his eyes are doing well. Pt denies any recent fever or chills, he denies depression, anxiety or insomnia.    Updated Prior Medication List: FUROSEMIDE 20 MG  TABS (FUROSEMIDE) Take 1 tablet by mouth once a day AMLODIPINE BESYLATE 5 MG  TABS (AMLODIPINE BESYLATE) Take 1 tablet by mouth once a day ASPIRIN 81 MG  TBEC (ASPIRIN) Take 1 tablet by mouth once a day FLOMAX 0.4 MG  CP24 (TAMSULOSIN HCL) Take two tablets by mouth at bedtime LANTUS 100 UNIT/ML  SOLN (INSULIN GLARGINE) Inject 50units sub q once daily GLIPIZIDE 10 MG  TB24 (GLIPIZIDE) Take 1 tablet by mouth two times a day LEVOTHYROXINE SODIUM 175 MCG  TABS (LEVOTHYROXINE SODIUM) Take 1 tablet by mouth once a day GLUCOSAMINE 1500 COMPLEX   CAPS (GLUCOSAMINE-CHONDROIT-VIT C-MN) Take one to two tablets by mouth daily LOVASTATIN 40 MG  TABS (LOVASTATIN) Take 1 tab by mouth at bedtime DARVOCET-N 100 100-650 MG  TABS (PROPOXYPHENE N-APAP) Take 1 tablet by mouth two times a day as needed ALEVE 220 MG  TABS (NAPROXEN SODIUM) Take two tablets by mouth daily as needed OXYBUTYNIN CHLORIDE 5 MG TABS (OXYBUTYNIN CHLORIDE) one tab by mouth three times a day TERAZOSIN HCL 5 MG CAPS (TERAZOSIN HCL) one cap by mouth once daily AMLODIPINE BESYLATE 5 MG TABS (AMLODIPINE BESYLATE) one tab by mouth once  daily  Current Allergies: ! PCN   Family History:    Family History of Diabetes    MOTHER DECEASED  DIABETIC    FATHER  DECEASED PROSTATE CANCER    THREE SISTERS LIVING  / ONE DIABETIC    ONE BROTHERS LIVING / ALL THREE HYPERTENSION/ TWO DIABETIC    THREE BROTHER DECEASED    FIVE CHIDREN    Review of Systems  ENT      Denies earache, hoarseness, nasal congestion, sinus pressure, and sore throat.  CV      Denies chest pain or discomfort, difficulty breathing at night, difficulty breathing while lying down, palpitations, and swelling of feet.  Resp      Denies cough, shortness of breath, sputum productive, and wheezing.  GI      Denies abdominal pain, constipation, diarrhea, nausea, and vomiting.  GU      Denies dysuria, incontinence, nocturia, urinary frequency, and urinary hesitancy.  MS      Complains of joint pain, low back pain, mid back pain, and stiffness.      Denies muscle weakness.  Psych      Denies anxiety and depression.  Endo      Denies excessive thirst and polyuria.   Physical Exam  General:     Well-developed,well-nourished,in no acute distress; alert,appropriate and cooperative throughout examination Head:     Normocephalic and atraumatic without obvious abnormalities. No apparent alopecia or balding. Eyes:     vision grossly intact.   Ears:     External ear exam shows no significant lesions or deformities.  Otoscopic examination reveals clear canals, tympanic membranes are intact bilaterally without bulging, retraction, inflammation or discharge. Hearing is grossly normal bilaterally. Nose:     no external erythema and no nasal discharge.   Mouth:     fair dentition.   Neck:     No deformities, masses, or tenderness noted. Lungs:     Normal respiratory effort, chest expands symmetrically. Lungs are clear to auscultation, no crackles or wheezes. Heart:     Normal rate and regular rhythm. S1 and S2 normal without gallop, murmur, click,  rub or other extra sounds. Abdomen:     soft and non-tender.   Msk:     Decreased rom spine , hips and knees Extremities:     trace eddma Neurologic:     alert & oriented X3, cranial nerves II-XII intact, strength normal in all extremities, and abnormal gait.  Decreased sensation in glove and stocking distribution. Skin:     hyperpigmented. stasis dermatiis with chromic R leg ulcer   Cervical Nodes:     No lymphadenopathy noted Psych:     Oriented X3, normally interactive, good eye contact, not anxious appearing, and not depressed appearing.    Diabetes Management Exam:    Foot Exam (with socks and/or shoes not present):       Sensory-Monofilament:          Left foot: diminished          Right foot: diminished       Inspection:          Left foot: abnormal             Comments: stasis dermatitis healed ulcer, fungal infection          Right foot: abnormal             Comments: stasis dermatitis fungal infection       Nails:          Left foot: fungal infection          Right foot: fungal infection    Eye Exam:       Eye Exam done elsewhere          Date: 08/09    Impression & Recommendations:  Problem # 1:  IDDM (ICD-250.01) Assessment: Improved  His updated medication list for this problem includes:    Aspirin 81 Mg Tbec (Aspirin) .Marland Kitchen... Take 1 tablet by mouth once a day    Lantus 100 Unit/ml Soln (Insulin glargine) ..... Inject 50units sub q once daily    Glipizide 10 Mg Tb24 (Glipizide) .Marland Kitchen... Take 1 tablet by mouth two times a day PT to resume lantus at a lower dose  Labs Reviewed: HgBA1c: 8.3 (08/21/2008)   Creat: 1.33 (06/13/2008)   Microalbumin: 2.88 (11/23/2007)   Problem # 2:  PERIPHERAL VASCULAR DISEASE (ICD-443.9) Assessment: Unchanged  Problem # 3:  HYPOTHYROIDISM (ICD-244.9) Assessment: Unchanged  His updated medication list for this problem includes:    Levothyroxine Sodium 175 Mcg Tabs (Levothyroxine sodium) .Marland Kitchen... Take 1 tablet by mouth once a day   Labs Reviewed: TSH: 0.931 (06/13/2008)    HgBA1c: 8.3 (08/21/2008) Chol: 178 (06/13/2008)   HDL: 68 (06/13/2008)  LDL: 100 (06/13/2008)   TG: 48 (06/13/2008)   Problem # 4:  OBESITY (ICD-278.00) Assessment: Improved  Problem # 5:  HYPERLIPIDEMIA (ICD-272.4) Assessment: Unchanged  His updated medication list for this problem includes:    Lovastatin 40 Mg Tabs (Lovastatin) .Marland Kitchen... Take 1 tab by mouth at bedtime  Labs Reviewed: Chol: 178 (06/13/2008)   HDL: 68 (06/13/2008)   LDL: 100 (06/13/2008)   TG: 48 (06/13/2008) SGOT: 18 (06/13/2008)   SGPT: 16 (06/13/2008)   Problem # 6:  HYPERTENSION (ICD-401.9) Assessment: Improved  His updated medication list for this problem includes:    Furosemide 20 Mg Tabs (Furosemide) .Marland Kitchen... Take 1 tablet by mouth once a day    Amlodipine Besylate 5 Mg Tabs (Amlodipine besylate) .Marland Kitchen... Take 1 tablet by mouth once a day    Terazosin Hcl 5 Mg Caps (Terazosin hcl) ..... One cap by mouth once daily    Amlodipine Besylate 5 Mg Tabs (Amlodipine besylate) ..... One tab by mouth once daily   Complete Medication List: 1)  Furosemide 20 Mg Tabs (Furosemide) .... Take 1 tablet by mouth once a day 2)  Amlodipine Besylate 5 Mg Tabs (Amlodipine besylate) .... Take 1 tablet by mouth once a day 3)  Aspirin 81 Mg Tbec (Aspirin) .... Take 1 tablet by mouth once a day 4)  Flomax 0.4 Mg Cp24 (Tamsulosin hcl) .... Take two tablets by mouth at bedtime 5)  Lantus 100 Unit/ml Soln (Insulin glargine) .... Inject 50units sub q once daily 6)  Glipizide 10 Mg Tb24 (Glipizide) .... Take 1 tablet by mouth two times a day 7)  Levothyroxine Sodium 175 Mcg Tabs (Levothyroxine sodium) .... Take 1 tablet by mouth once a day 8)  Glucosamine 1500 Complex Caps (Glucosamine-chondroit-vit c-mn) .... Take one to two tablets by mouth daily 9)  Lovastatin 40 Mg Tabs (Lovastatin) .... Take 1 tab by mouth at bedtime 10)  Darvocet-n 100 100-650 Mg Tabs (Propoxyphene n-apap) .... Take 1 tablet  by mouth two times a day as needed 11)  Aleve 220 Mg Tabs (Naproxen sodium) .... Take two tablets by mouth daily as needed 12)  Oxybutynin Chloride 5 Mg Tabs (Oxybutynin chloride) .... One tab by mouth three times a day 13)  Terazosin Hcl 5 Mg Caps (Terazosin hcl) .... One cap by mouth once daily 14)  Amlodipine Besylate 5 Mg Tabs (Amlodipine besylate) .... One tab by mouth once daily  Other Orders: Glucose, (CBG) ([id number]) Hemoglobin A1C [id number])   Patient Instructions: 1)  Please schedule a follow-up appointment in 2 months. 2)  PLEASE RESTART LANTUS`  7 units evry morning , continue all the other meds as before, 3)  YOUR BLOOD PRESSURE, CHOLESTEROL AND THYROID ARE ALL GREAT!!! Keep it up 4)  CALL IN OCTOBER for your flu shots.   ] Laboratory Results   Blood Tests   Date/Time Received: 08/21/08 1:44pm Date/Time Reported: 08/21/08 1:44pm  Glucose (random): 285 mg/dL   (Normal Range: 70-105) HGBA1C: 8.3%   (Normal Range: Non-Diabetic - 3-6%   Control Diabetic - 6-8%)    Stool - Occult Blood Hemmoccult #1: negative Date: 08/21/2008 Comments: [id number] 2R 7/10 5067 10/11

## 2011-01-06 NOTE — Letter (Signed)
Summary: Letter  Letter   Imported By: Dierdre Harness 08/29/2010 13:28:22  _____________________________________________________________________  External Attachment:    Type:   Image     Comment:   External Document

## 2011-01-06 NOTE — Assessment & Plan Note (Signed)
Summary: F UP   Vital Signs:  Patient profile:   77 year old male Height:      66.5 inches Weight:      197 pounds BMI:     31.43 O2 Sat:      94 % Pulse rate:   91 / minute Resp:     16 per minute BP sitting:   160 / 80  (left arm) Cuff size:   large  Vitals Entered By: Kate Sable LPN (May 17, 624THL 579FGE AM) CC: Follow up chronic problems   Primary Care Provider:  Ginnette Gates, md  CC:  Follow up chronic problems.  History of Present Illness: Reports  that he is doing fairly well. He is accompanied by his wife who statesthat the family is once again actively involved in hisdiabetic management ,and he is seeing the endocrinologist to which hewas referred. Denies recent fever or chills. Denies sinus pressure, nasal congestion , ear pain or sore throat. Denies chest congestion, or cough productive of sputum. Denies chest pain, palpitations, PND, orthopnea or leg swelling. Denies abdominal pain, nausea, vomitting, diarrhea or constipation. Denies change in bowel movements or bloody stool. Denies dysuria , frequency, incontinence or hesitancy. Chronic joint pain, swelling, and reduced mobility.no recent gout flares Denies headaches, vertigo, seizures. Denies depression, anxiety or insomnia.controlledon meds Denies  rash, lesions, or itch.     Current Medications (verified): 1)  Furosemide 20 Mg  Tabs (Furosemide) .... Take 1 Tablet By Mouth Once A Day 2)  Amlodipine Besylate 5 Mg  Tabs (Amlodipine Besylate) .... Take 1 Tablet By Mouth Once A Day 3)  Aspirin 81 Mg  Tbec (Aspirin) .... Take 1 Tablet By Mouth Once A Day 4)  Glipizide 10 Mg  Tb24 (Glipizide) .... Take 1 Tablet By Mouth Two Times A Day 5)  Levothyroxine Sodium 200 Mcg Tabs (Levothyroxine Sodium) .... Take 1 Tablet By Mouth Once A Day 6)  Lovastatin 40 Mg  Tabs (Lovastatin) .... Take 1 Tab By Mouth At Bedtime 7)  Oxybutynin Chloride 5 Mg Tabs (Oxybutynin Chloride) .... One Tab By Mouth Three Times A Day 8)   Terazosin Hcl 5 Mg Caps (Terazosin Hcl) .... One Cap By Mouth Once Daily  Allergies (verified): 1)  ! Pcn  Review of Systems      See HPI General:  Complains of fatigue. Eyes:  Complains of vision loss-both eyes; denies discharge and red eye. MS:  Complains of joint pain, low back pain, mid back pain, muscle weakness, and stiffness. Neuro:  Complains of disturbances in coordination, memory loss, poor balance, tingling, visual disturbances, and weakness. Psych:  Complains of anxiety and depression; denies suicidal thoughts/plans, thoughts of violence, and unusual visions or sounds. Endo:  Denies excessive thirst, excessive urination, and weight change. Heme:  Denies abnormal bruising and bleeding. Allergy:  Denies hives or rash and itching eyes.  Physical Exam  General:  alert, well-hydrated, and overweight-appearing. HEENT: No facial asymmetry,  EOMI, No sinus tenderness, TM's Clear, oropharynx  pink and moist.   Chest: Clear to auscultation bilaterally.  CVS: S1, S2, No murmurs, No S3.   Abd: Soft, Nontender.  MS: decreased ROM spine, hips, shoulders and knees.  Ext: No edema.   CNS: CN 2-12 intact, power tone normal throughout.sensation decreased in glove and stocking distribution.   Skin: 2healed ulcer, hyperpigmentation of skin on legs Psych: Good eye contact, normal affect.  Memory loss, not anxious or depressed appearing.      Impression & Recommendations:  Problem #  1:  NUMBNESS (ICD-782.0) Assessment Unchanged  Problem # 2:  DIABETIC ULCER, LEFT LEG (ICD-250.80) Assessment: Improved  The following medications were removed from the medication list:    Humulin 70/30 70-30 % Susp (Insulin isophane & regular) .Marland KitchenMarland KitchenMarland KitchenMarland Kitchen 5 units two times a day His updated medication list for this problem includes:    Aspirin 81 Mg Tbec (Aspirin) .Marland Kitchen... Take 1 tablet by mouth once a day    Glipizide 10 Mg Tb24 (Glipizide) .Marland Kitchen... Take 1 tablet by mouth two times a day  Problem # 3:  IDDM  (ICD-250.01) Assessment: Unchanged  The following medications were removed from the medication list:    Humulin 70/30 70-30 % Susp (Insulin isophane & regular) .Marland KitchenMarland KitchenMarland KitchenMarland Kitchen 5 units two times a day His updated medication list for this problem includes:    Aspirin 81 Mg Tbec (Aspirin) .Marland Kitchen... Take 1 tablet by mouth once a day    Glipizide 10 Mg Tb24 (Glipizide) .Marland Kitchen... Take 1 tablet by mouth two times a day  Orders: T-Urine Microalbumin w/creat. ratio [phone])  Labs Reviewed: Creat: 1.53 (03/05/2010)    Reviewed HgBA1c results: 10.5 (03/05/2010), currently nmanaged by endo  10.5 (01/01/2010)  Problem # 4:  HYPOTHYROIDISM (ICD-244.9) Assessment: Comment Only  The following medications were removed from the medication list:    Levothyroxine Sodium 200 Mcg Tabs (Levothyroxine sodium) .Marland Kitchen... Take 1 tablet by mouth once a day His updated medication list for this problem includes:    Levothroid 200 Mcg Tabs (Levothyroxine sodium) ..... One tablet daily monday through friday, half tablet saturday and sunday  Orders: T-TSH [id number])  Labs Reviewed: TSH: 17.579 (03/05/2010)    HgBA1c: 10.5 (03/05/2010) Chol: 218 (01/01/2010)   HDL: 79 (01/01/2010)   LDL: 124 (01/01/2010)   TG: 73 (01/01/2010)  Problem # 5:  DEPRESSION (ICD-311) Assessment: Improved d/c fluuoxetine, pt needs simplification of med regime  Problem # 6:  HYPERTENSION (ICD-401.9) Assessment: Deteriorated  His updated medication list for this problem includes:    Furosemide 20 Mg Tabs (Furosemide) .Marland Kitchen... Take 1 tablet by mouth once a day    Amlodipine Besylate 5 Mg Tabs (Amlodipine besylate) .Marland Kitchen... Take 1 tablet by mouth once a day    Terazosin Hcl 5 Mg Caps (Terazosin hcl) ..... One cap by mouth once daily  BP today: 160/80 Prior BP: 150/90 (03/05/2010)  Labs Reviewed: K+: 4.7 (03/05/2010) Creat: : 1.53 (03/05/2010)   Chol: 218 (01/01/2010)   HDL: 79 (01/01/2010)   LDL: 124 (01/01/2010)   TG: 73  (01/01/2010)  Problem # 7:  HYPERLIPIDEMIA (ICD-272.4) Assessment: Comment Only  His updated medication list for this problem includes:    Lovastatin 40 Mg Tabs (Lovastatin) .Marland Kitchen... Take 1 tab by mouth at bedtime  Labs Reviewed: SGOT: 20 (01/01/2010)   SGPT: 13 (01/01/2010)   HDL:79 (01/01/2010), 62 (04/16/2009)  LDL:124 (01/01/2010), 126 (04/16/2009)  Chol:218 (01/01/2010), 204 (04/16/2009)  Trig:73 (01/01/2010), 78 (04/16/2009) low fat diet discussed and encouraged  Complete Medication List: 1)  Furosemide 20 Mg Tabs (Furosemide) .... Take 1 tablet by mouth once a day 2)  Amlodipine Besylate 5 Mg Tabs (Amlodipine besylate) .... Take 1 tablet by mouth once a day 3)  Aspirin 81 Mg Tbec (Aspirin) .... Take 1 tablet by mouth once a day 4)  Glipizide 10 Mg Tb24 (Glipizide) .... Take 1 tablet by mouth two times a day 5)  Lovastatin 40 Mg Tabs (Lovastatin) .... Take 1 tab by mouth at bedtime 6)  Oxybutynin Chloride 5 Mg Tabs (Oxybutynin chloride) .... One tab  by mouth three times a day 7)  Terazosin Hcl 5 Mg Caps (Terazosin hcl) .... One cap by mouth once daily 8)  Levothroid 200 Mcg Tabs (Levothyroxine sodium) .... One tablet daily monday through friday, half tablet saturday and sunday  Other Orders: T-PSA ([id number])  Patient Instructions: 1)  Please schedule a follow-up appointment in 3.5 months. 2)  TSH prior to visit, ICD-9: 3)  Urine Microalbumin prior to visit, ICD-9:  today 4)  PSA prior to visit, ICD-9: 5)  pls follow a diabetic diet and keep appts  Prescriptions: LEVOTHROID 200 MCG TABS (LEVOTHYROXINE SODIUM) one tablet daily Monday through Friday, half tablet Saturday and Sunday  #24 x 5   Entered and Authorized by:     MD   Signed by:     MD on 05/05/2010   Method used:   Printed then faxed to ...       [facility]  [address] [address] (retail)       [address]       [place]       [place], [place]  [address]       Ph: [phone]       Fax:  [phone]   RxID:   [id number] GLIPIZIDE 10 MG  TB24 (GLIPIZIDE) Take 1 tablet by mouth two times a day  #60 Each x 3   Entered by:   Brandi Hudy LPN   Authorized by:     MD   Signed by:   Brandi Hudy LPN on 04/22/2010   Method used:   Electronically to        [facility]  [address] [address] (retail)       [address]       [place], [place]  [address]       Ph: [phone]       Fax: [phone]   RxID:   [id number]

## 2011-01-06 NOTE — Assessment & Plan Note (Signed)
Summary: office visit   Vital Signs:  Patient profile:   77 year old male Height:      66.5 inches Weight:      200 pounds BMI:     31.91 Pulse rate:   103 / minute Pulse rhythm:   regular Resp:     16 per minute BP sitting:   128 / 72 Cuff size:   large  Vitals Entered By: Kate Sable (January 01, 2010 9:58 AM)  Nutrition Counseling: Patient's BMI is greater than 25 and therefore counseled on weight management options. CC: has been getting nauseated  some off and on after eating   Primary Care Provider:  margaret simpson, md  CC:  has been getting nauseated  some off and on after eating.  History of Present Illness: Reports  thathe has been doing fairly well. Denies recent fever or chills. Denies sinus pressure, nasal congestion , ear pain or sore throat. Denies chest congestion, or cough productive of sputum. Denies chest pain, palpitations, PND, orthopnea or leg swelling. Denies abdominal pain,  vomitting, diarrhea or constipation. He does report intermitent nausea. Denies change in bowel movements or bloody stool. Denies dysuria , frequency, incontinence or hesitancy. Denies  joint pain, swelling, or reduced mobility. Denies headaches, vertigo, seizures. Denies depression, anxiety or insomnia. Denies  rash, lesions, or itch. The pt has not been testing his sugars regulrly, and states when he does, they are high in te 300's. he does have polyuria, polydypsia, and drymouth     Current Medications (verified): 1)  Furosemide 20 Mg  Tabs (Furosemide) .... Take 1 Tablet By Mouth Once A Day 2)  Amlodipine Besylate 5 Mg  Tabs (Amlodipine Besylate) .... Take 1 Tablet By Mouth Once A Day 3)  Aspirin 81 Mg  Tbec (Aspirin) .... Take 1 Tablet By Mouth Once A Day 4)  Glipizide 10 Mg  Tb24 (Glipizide) .... Take 1 Tablet By Mouth Two Times A Day 5)  Levothyroxine Sodium 200 Mcg Tabs (Levothyroxine Sodium) .... Take 1 Tablet By Mouth Once A Day 6)  Lovastatin 40 Mg  Tabs  (Lovastatin) .... Take 1 Tab By Mouth At Bedtime 7)  Oxybutynin Chloride 5 Mg Tabs (Oxybutynin Chloride) .... One Tab By Mouth Three Times A Day 8)  Terazosin Hcl 5 Mg Caps (Terazosin Hcl) .... One Cap By Mouth Once Daily 9)  Fluoxetine Hcl 10 Mg Caps (Fluoxetine Hcl) .... Take 1 Tablet By Mouth Once A Day 10)  Novolin 70/30 70-30 % Susp (Insulin Isophane & Regular) .... 5 Units Twice Daily  Allergies (verified): 1)  ! Pcn  Review of Systems      See HPI General:  Complains of fatigue. Eyes:  Complains of blurring; denies discharge. MS:  Complains of joint pain and stiffness. Neuro:  Complains of numbness and tingling; denies headaches, seizures, and sensation of room spinning. Endo:  Complains of excessive thirst, excessive urination, and polyuria. Heme:  Denies abnormal bruising and bleeding. Allergy:  Denies hives or rash and sneezing.  Physical Exam  General:  alert, well-hydrated, and overweight-appearing. HEENT: No facial asymmetry,  EOMI, No sinus tenderness, TM's Clear, oropharynx  pink and moist.   Chest: Clear to auscultation bilaterally.  CVS: S1, S2, No murmurs, No S3.   Abd: Soft, Nontender.  MS: decreased ROM spine, hips, shoulders and knees.  Ext: No edema.   CNS: CN 2-12 intact, power tone normal throughout.sensation decreased in glove and stocking distribution.   Skin: 2healed ulcer, hyperpigmentation of skin on legs  Psych: Good eye contact, normal affect.  Memory loss, not anxious or depressed appearing.      Impression & Recommendations:  Problem # 1:  NUMBNESS (ICD-782.0) Assessment Deteriorated  Problem # 2:  DIABETIC ULCER, LEFT LEG (ICD-250.80) Assessment: Improved  His updated medication list for this problem includes:    Aspirin 81 Mg Tbec (Aspirin) .Marland Kitchen... Take 1 tablet by mouth once a day    Glipizide 10 Mg Tb24 (Glipizide) .Marland Kitchen... Take 1 tablet by mouth two times a day    Novolin 70/30 70-30 % Susp (Insulin isophane & regular) .Marland KitchenMarland KitchenMarland KitchenMarland Kitchen 5 units twice  daily  Problem # 3:  IDDM (ICD-250.01) Assessment: Improved  His updated medication list for this problem includes:    Aspirin 81 Mg Tbec (Aspirin) .Marland Kitchen... Take 1 tablet by mouth once a day    Glipizide 10 Mg Tb24 (Glipizide) .Marland Kitchen... Take 1 tablet by mouth two times a day    Novolin 70/30 70-30 % Susp (Insulin isophane & regular) .Marland KitchenMarland KitchenMarland KitchenMarland Kitchen 5 units twice daily  Orders: Glucose, (CBG) ([id number]) Hemoglobin A1C (83036)  Labs Reviewed: Creat: 1.63 (08/15/2009)    Reviewed HgBA1c results: 10.5 (01/01/2010)  11.2 (11/26/2009)  Problem # 4:  DEPRESSION (ICD-311) Assessment: Improved  His updated medication list for this problem includes:    Fluoxetine Hcl 10 Mg Caps (Fluoxetine hcl) .Marland Kitchen... Take 1 tablet by mouth once a day  Problem # 5:  HYPERTENSION (ICD-401.9) Assessment: Unchanged  His updated medication list for this problem includes:    Furosemide 20 Mg Tabs (Furosemide) .Marland Kitchen... Take 1 tablet by mouth once a day    Amlodipine Besylate 5 Mg Tabs (Amlodipine besylate) .Marland Kitchen... Take 1 tablet by mouth once a day    Terazosin Hcl 5 Mg Caps (Terazosin hcl) ..... One cap by mouth once daily  Orders: T-Basic Metabolic Panel ([id number])  BP today: 128/72 Prior BP: 120/80 (11/20/2009)  Labs Reviewed: K+: 4.4 (08/15/2009) Creat: : 1.63 (08/15/2009)   Chol: 204 (04/16/2009)   HDL: 62 (04/16/2009)   LDL: 126 (04/16/2009)   TG: 78 (04/16/2009)  Problem # 6:  HYPERLIPIDEMIA (ICD-272.4) Assessment: Comment Only  His updated medication list for this problem includes:    Lovastatin 40 Mg Tabs (Lovastatin) .Marland Kitchen... Take 1 tab by mouth at bedtime  Orders: T-Hepatic Function [id number]) T-Lipid Profile [phone])  Labs Reviewed: SGOT: 19 (04/16/2009)   SGPT: 15 (04/16/2009)   HDL:62 (04/16/2009), 68 (06/13/2008)  LDL:126 (04/16/2009), 100 (06/13/2008)  Chol:204 (04/16/2009), 178 (06/13/2008)  Trig:78 (04/16/2009), 48 (06/13/2008)  Complete Medication List: 1)  Furosemide 20 Mg Tabs (Furosemide)  .... Take 1 tablet by mouth once a day 2)  Amlodipine Besylate 5 Mg Tabs (Amlodipine besylate) .... Take 1 tablet by mouth once a day 3)  Aspirin 81 Mg Tbec (Aspirin) .... Take 1 tablet by mouth once a day 4)  Glipizide 10 Mg Tb24 (Glipizide) .... Take 1 tablet by mouth two times a day 5)  Levothyroxine Sodium 200 Mcg Tabs (Levothyroxine sodium) .... Take 1 tablet by mouth once a day 6)  Lovastatin 40 Mg Tabs (Lovastatin) .... Take 1 tab by mouth at bedtime 7)  Oxybutynin Chloride 5 Mg Tabs (Oxybutynin chloride) .... One tab by mouth three times a day 8)  Terazosin Hcl 5 Mg Caps (Terazosin hcl) .... One cap by mouth once daily 9)  Fluoxetine Hcl 10 Mg Caps (Fluoxetine hcl) .... Take 1 tablet by mouth once a day 10)  Novolin 70/30 70-30 % Susp (Insulin isophane & regular) .... 5 units twice daily  Other Orders: T-TSH [phone])  Patient Instructions: 1)  Please schedule a follow-up appointment in 2 months. 2)  BMP prior to visit, ICD-9: 3)  Hepatic Panel prior to visit, ICD-9:  fasting today 4)  TSH prior to visit, ICD-9: 5)  PLS check and   blood sugars twice daily fasting , range is 90 to130 and  and 2 hrs after a meal or at bedtime  130 to 180  Laboratory Results   Blood Tests   Date/Time Received: January 01, 2010  Date/Time Reported: January 01, 2010   Glucose (random): 309 mg/dL   (Normal Range: 70-105) HGBA1C: 10.5%   (Normal Range: Non-Diabetic - 3-6%   Control Diabetic - 6-8%)

## 2011-01-08 NOTE — Assessment & Plan Note (Signed)
Summary: office visit   Vital Signs:  Patient profile:   77 year old male Height:      66.5 inches Weight:      191.50 pounds BMI:     30.56 O2 Sat:      98 % on Room air Pulse rate:   61 / minute Pulse rhythm:   regular Resp:     16 per minute BP sitting:   120 / 80  (left arm)  Vitals Entered By: Baldomero Lamy LPN (December 21, 624THL 10:03 AM)  Nutrition Counseling: Patient's BMI is greater than 25 and therefore counseled on weight management options.  O2 Flow:  Room air CC: follow-up visit Is Patient Diabetic? Yes Did you bring your meter with you today? No   Primary Care Provider:  margaret simpson, md  CC:  follow-up visit.  History of Present Illness: Reports  that he feels fairly well. Denies recent fever or chills. Denies sinus pressure, nasal congestion , ear pain or sore throat. Denies chest congestion, or cough productive of sputum. Denies chest pain, palpitations, PND, orthopnea or leg swelling. Denies abdominal pain, nausea, vomitting, diarrhea or constipation. Denies change in bowel movements or bloody stool. Denies dysuria , frequency, incontinence or hesitancy.  Denies headaches, vertigo, seizures. Denies depression, anxiety or insomnia.    Current Medications (verified): 1)  Furosemide 20 Mg  Tabs (Furosemide) .... Take 1 Tablet By Mouth Once A Day 2)  Amlodipine Besylate 5 Mg  Tabs (Amlodipine Besylate) .... Take 1 Tablet By Mouth Once A Day 3)  Aspirin 81 Mg  Tbec (Aspirin) .... Take 1 Tablet By Mouth Once A Day 4)  Glipizide 10 Mg  Tb24 (Glipizide) .... Take 1 Tablet By Mouth Two Times A Day 5)  Oxybutynin Chloride 5 Mg Tabs (Oxybutynin Chloride) .... Two Tabs By Mouth Three Times A Day 6)  Levothroid 200 Mcg Tabs (Levothyroxine Sodium) .... One Tablet Daily Monday Through Friday, Half Tablet Saturday and Sunday 7)  Fluoxetine Hcl 10 Mg Caps (Fluoxetine Hcl) .... One Cap By Mouth Once Daily 8)  Lovastatin 40 Mg Tabs (Lovastatin) .... Take Two  Tablets By Mouth At Bedtime (Dose Change)  Allergies (verified): 1)  ! Pcn  Review of Systems      See HPI General:  Complains of fatigue. Eyes:  Complains of vision loss-both eyes; denies discharge and eye pain; reports being recommended to have cataract removed,has misgivings. MS:  Complains of joint pain, low back pain, mid back pain, and stiffness. Derm:  Complains of lesion(s), poor wound healing, and rash; left leg ulcer currently being  treated. Neuro:  Complains of falling down, numbness, and poor balance; 3 to 4 falls per week, numbness in both hands. Psych:  Complains of anxiety and depression; denies suicidal thoughts/plans, thoughts of violence, and unusual visions or sounds; improved on med. Endo:  Complains of weight change; denies excessive thirst and excessive urination; states he tests on avg 3 times per week, average is about 215. Heme:  Denies abnormal bruising and bleeding. Allergy:  Denies hives or rash and itching eyes.  Physical Exam  General:  Well-developed,well-nourished,in no acute distress; alert,appropriate and cooperative throughout examination HEENT: No facial asymmetry,  EOMI, No sinus tenderness, TM's Clear, oropharynx  pink and moist.   Chest: Clear to auscultation bilaterally.  CVS: S1, S2, No murmurs, No S3.   Abd: Soft, Nontender.  MS: decreased  ROM spine, hips, shoulders and knees.  Ext: No edema.   CNS: CN 2-12 intact, power and  onenormal, decreased  sensation in hands and feet. skin: left leg ulcer  Psych: Good eye contact, normal affect.  Memory lossnot anxious or depressed appearing.   Diabetes Management Exam:    Foot Exam (with socks and/or shoes not present):       Sensory-Monofilament:          Left foot: diminished          Right foot: diminished       Inspection:          Left foot: normal          Right foot: normal       Nails:          Left foot: fungal infection          Right foot: ingrown   Impression &  Recommendations:  Problem # 1:  DIABETES MELLITUS, TYPE II (ICD-250.00) Assessment Deteriorated  His updated medication list for this problem includes:    Aspirin 81 Mg Tbec (Aspirin) .Marland Kitchen... Take 1 tablet by mouth once a day    Glipizide 10 Mg Tb24 (Glipizide) .Marland Kitchen... Take 1 tablet by mouth two times a day pt non compliant, multiple attempts to have him folow with endo unsuccesful  Problem # 2:  DEPRESSION (ICD-311) Assessment: Improved  His updated medication list for this problem includes:    Fluoxetine Hcl 10 Mg Caps (Fluoxetine hcl) ..... One cap by mouth once daily  Orders: Medicare Electronic Prescription [phone])  Problem # 3:  HYPERTENSION (ICD-401.9) Assessment: Improved  The following medications were removed from the medication list:    Terazosin Hcl 5 Mg Caps (Terazosin hcl) ..... One cap by mouth once daily His updated medication list for this problem includes:    Furosemide 20 Mg Tabs (Furosemide) .Marland Kitchen... Take 1 tablet by mouth once a day    Amlodipine Besylate 5 Mg Tabs (Amlodipine besylate) .Marland Kitchen... Take 1 tablet by mouth once a day  BP today: 120/80 Prior BP: 146/80 (08/27/2010)  Labs Reviewed: K+: 4.8 (08/27/2010) Creat: : 1.56 (08/27/2010)   Chol: 214 (08/27/2010)   HDL: 73 (08/27/2010)   LDL: 129 (08/27/2010)   TG: 59 (08/27/2010)  Problem # 4:  HYPOTHYROIDISM (ICD-244.9) Assessment: Comment Only  His updated medication list for this problem includes:    Levothroid 200 Mcg Tabs (Levothyroxine sodium) ..... One tablet daily monday through friday, half tablet saturday and sunday  Orders: T-TSH [phone])  Labs Reviewed: TSH: 4.012 (08/27/2010)    HgBA1c: 11.8 (08/27/2010) Chol: 214 (08/27/2010)   HDL: 73 (08/27/2010)   LDL: 129 (08/27/2010)   TG: 59 (08/27/2010)  Problem # 5:  PERIPHERAL VASCULAR DISEASE (ICD-443.9) Assessment: Deteriorated currently has skin breakdown and ulceration  Complete Medication List: 1)  Furosemide 20 Mg Tabs (Furosemide)  .... Take 1 tablet by mouth once a day 2)  Amlodipine Besylate 5 Mg Tabs (Amlodipine besylate) .... Take 1 tablet by mouth once a day 3)  Aspirin 81 Mg Tbec (Aspirin) .... Take 1 tablet by mouth once a day 4)  Glipizide 10 Mg Tb24 (Glipizide) .... Take 1 tablet by mouth two times a day 5)  Oxybutynin Chloride 5 Mg Tabs (Oxybutynin chloride) .... Two tabs by mouth three times a day 6)  Levothroid 200 Mcg Tabs (Levothyroxine sodium) .... One tablet daily monday through friday, half tablet saturday and sunday 7)  Fluoxetine Hcl 10 Mg Caps (Fluoxetine hcl) .... One cap by mouth once daily 8)  Lovastatin 40 Mg Tabs (Lovastatin) .... Take two tablets by mouth at  bedtime (dose change)  Other Orders: Physical Therapy Referral (PT) T-Hepatic Function [id number]) T-Lipid Profile [phone]) T- Hemoglobin A1C [id number]) B12-[facility] [id number])  Patient Instructions: 1)  Please schedule a follow-up appointment in 3 months. 2)  Check your blood sugars regularly. If your readings are usually above250: or below 70 you should contact our office. 3)  Hepatic Panel prior to visit, ICD-9: 4)  Lipid Panel prior to visit, ICD-9: 5)  TSH prior to visit, ICD-9:   today 6)  HbgA1C prior to visit, ICD-9:, b12 level today. 7)  It is vERY impt to keep ypour appt with  the diabetes specialist , and you should betesting your sugars at least once daily. 8)  I do recommend you get the cataract removed 9)  you are being referred to physical therapy because of your falls,pls keep appt Prescriptions: FLUOXETINE HCL 10 MG CAPS (FLUOXETINE HCL) one cap by mouth once daily  #30 x 3   Entered by:   Baldomero Lamy LPN   Authorized by:   Tula Nakayama MD   Signed by:   Baldomero Lamy LPN on 624THL   Method used:   Electronically to        [facility] (retail)       [address], [place]  [address]       Ph: [phone]       Fax: [phone]   RxID:    [id number]    Orders Added: 1)  Est. Patient Level IV [id number] 2)  Physical Therapy Referral [PT] 3)  Medicare Electronic Prescription [id number])  T-Hepatic Function [[id number]] 5)  T-Lipid Profile [[id number]] 6)  T- Hemoglobin A1C [[id number]] 7)  T-TSH [id number] 8)  B12-[facility] [id number]

## 2011-01-08 NOTE — Progress Notes (Signed)
Summary: please advise  Phone Note From Other Clinic   Caller: DR. Carlis Abbott Call For: Dr. Carlis Abbott Summary of Call: states that Chase [name] has missed two of his appointments with them.  She states he has a very high A1C per our notes, they would see patient again, but he has missed those two appt. and she thinks he might need a little encouragment from our office to get him to go to his appt.  her number is [phone] Initial call taken by: Eliezer Mccoy,  December 03, 2010 9:43 AM  Follow-up for Phone Call        we have explained the impt of going to the pt and to the wife on more than one occasion, I guess when we get an endo in [place] things will be easier Follow-up by: Tula Nakayama MD,  December 03, 2010 5:57 PM

## 2011-02-23 ENCOUNTER — Encounter: Payer: Self-pay | Admitting: Family Medicine

## 2011-02-25 ENCOUNTER — Ambulatory Visit: Payer: Self-pay | Admitting: Family Medicine

## 2011-03-12 ENCOUNTER — Encounter: Payer: Self-pay | Admitting: Family Medicine

## 2011-03-16 ENCOUNTER — Encounter: Payer: Self-pay | Admitting: Family Medicine

## 2011-03-16 ENCOUNTER — Ambulatory Visit: Payer: Self-pay | Admitting: Family Medicine

## 2011-03-16 ENCOUNTER — Ambulatory Visit (INDEPENDENT_AMBULATORY_CARE_PROVIDER_SITE_OTHER): Payer: Medicare Other | Admitting: Family Medicine

## 2011-03-16 VITALS — BP 140/84 | HR 72 | Resp 16 | Ht 66.5 in | Wt 198.8 lb

## 2011-03-16 DIAGNOSIS — E039 Hypothyroidism, unspecified: Secondary | ICD-10-CM

## 2011-03-16 DIAGNOSIS — E1149 Type 2 diabetes mellitus with other diabetic neurological complication: Secondary | ICD-10-CM

## 2011-03-16 DIAGNOSIS — J329 Chronic sinusitis, unspecified: Secondary | ICD-10-CM

## 2011-03-16 DIAGNOSIS — I10 Essential (primary) hypertension: Secondary | ICD-10-CM

## 2011-03-16 DIAGNOSIS — E785 Hyperlipidemia, unspecified: Secondary | ICD-10-CM

## 2011-03-16 DIAGNOSIS — E119 Type 2 diabetes mellitus without complications: Secondary | ICD-10-CM

## 2011-03-16 DIAGNOSIS — E1142 Type 2 diabetes mellitus with diabetic polyneuropathy: Secondary | ICD-10-CM

## 2011-03-16 LAB — BASIC METABOLIC PANEL
BUN: 27 mg/dL — ABNORMAL HIGH (ref 6–23)
CO2: 26 mEq/L (ref 19–32)
Calcium: 9.7 mg/dL (ref 8.4–10.5)
Chloride: 103 mEq/L (ref 96–112)
Creat: 1.89 mg/dL — ABNORMAL HIGH (ref 0.40–1.50)
Glucose, Bld: 209 mg/dL — ABNORMAL HIGH (ref 70–99)
Potassium: 4.8 mEq/L (ref 3.5–5.3)
Sodium: 137 mEq/L (ref 135–145)

## 2011-03-16 LAB — HEMOGLOBIN A1C
Hgb A1c MFr Bld: 9.4 % — ABNORMAL HIGH (ref <5.7)
Mean Plasma Glucose: 223 mg/dL — ABNORMAL HIGH (ref <117)

## 2011-03-16 LAB — TSH: TSH: 18.491 u[IU]/mL — ABNORMAL HIGH (ref 0.350–4.500)

## 2011-03-16 MED ORDER — AMLODIPINE BESYLATE 5 MG PO TABS: 5.0000 mg | ORAL_TABLET | Freq: Every day | ORAL | Status: DC

## 2011-03-16 MED ORDER — LEVOTHYROXINE SODIUM 200 MCG PO TABS: 200.0000 ug | ORAL_TABLET | Freq: Every day | ORAL | Status: DC

## 2011-03-16 MED ORDER — GLIPIZIDE ER 10 MG PO TB24: 10.0000 mg | ORAL_TABLET | Freq: Every day | ORAL | Status: DC

## 2011-03-16 MED ORDER — DOXYCYCLINE HYCLATE 100 MG PO TABS: 100.0000 mg | ORAL_TABLET | Freq: Two times a day (BID) | ORAL | Status: AC

## 2011-03-16 NOTE — Patient Instructions (Addendum)
F/U in 4 months.  hBA1C today and chem7 today and results will also be sent to Dr Iran Planas.  You need to call dr. Samuel Germany office today to find out about the medication he wants you on.  You need to make an appt with dr. Merlene Laughter since you are falling more  hBA1C and chem7 and TSH today, copy to Dr Loletta Specter  Medication is sent in for yellow drainage and bad breath, pls take entire course

## 2011-03-16 NOTE — Progress Notes (Signed)
  Subjective:    Patient ID: Chase [name], male    DOB: Feb 12, 1931, 77 y.o.   MRN: [id number]  HPI  Pt reports that his balance continues to be a problem , and he falls on average twice per week, states he has not been to see the neurologist in some time and will make an appt. States blood sugar has been good for 2 months , checks on avg 4 times per week,. Before breakfast and his values were 120 to 130, however in the past 1 month his sugars his average over 200, 220 He has started wrapping his feet again since last week, reports no drainage. Weakness in RUE, drops objects Right neck pain, for over 3 months, massaging the area causes it to go away, feels spasms and stiffness, popping the jaw causes some relief. C/o bad breath with yellow post nasal drainage  Review of Systems Denies recent fever or chills. Reports sinus pressure  with foul post nasal drainage,denies nasal congestion, ear pain or sore throat. Denies chest congestion, productive cough or wheezing. Denies chest pains, palpitations, paroxysmal nocturnal dyspnea, orthopnea and leg swelling Denies abdominal pain, nausea, vomiting,diarrhea or constipation.  Denies rectal bleeding or change in bowel movement. Denies dysuria, frequency, hesitancy or incontinence. Reports joint pain, swelling and limitation in mobility. Denies headaches or seizure,  Reports both numbness, and tingling in all 4 extremities. Denies depression,he takes medication for this, he reports both  anxiety and  insomnia. Denies skin break down, but is currently in an [employer]..        Objective:   Physical Exam Patient alert and oriented and in no Cardiopulmonary distress.  HEENT: No facial asymmetry, EOMI,maxillary sinus tenderness, TM's clear, Oropharynx pink and moist.  Neck supple no adenopathy. Poor dentition, with halitosis Chest: Clear to auscultation bilaterally.  CVS: S1, S2 no murmurs, no S3.  ABD: Soft non tender. Bowel sounds  normal.  Ext: No edema  MS: decreased ROM spine, shoulders, hips and knees.  Skin:Visible skin Intact,  Psych: Good eye contact, normal affect. Memory loss not anxious or depressed appearing.  CNS: CN 2-12 intact, power and  Tone normal. Sensation decreased  throughout.        Assessment & Plan:  1.Type 2 DM uncontrolled, pt currently followed by endocrine and encouraged to follow treatment and dietary plan outlined. 2. Hypothyroid : compliance traditionally has been a problem, will check TSH 3. Hypertension:Controlled, no changes in medication.  4. Hyperlipidemia: Hyperlipidemia:Low fat diet discussed and encouraged. Lipid profile to be checked. 5. Sinusitis with halitosis: antibiotics prescribed

## 2011-03-18 ENCOUNTER — Telehealth: Payer: Self-pay | Admitting: *Deleted

## 2011-03-18 NOTE — Telephone Encounter (Signed)
Message copied by Baldomero Lamy on Wed Mar 18, 2011 10:07 AM ------      Message from: Tula Nakayama      Created: Tue Mar 17, 2011  4:52 AM       Pls fax result to Dr Iran Planas.'      Let Mr Hildebrand know his blood sugar is better...good      Pls verify frompharmacy that he is filling the thyroid med as he should, let me know , because labs sugest he is non compliant, it is impt I get this info pls

## 2011-03-19 NOTE — Progress Notes (Signed)
Called patient, no answer 

## 2011-03-20 ENCOUNTER — Telehealth: Payer: Self-pay | Admitting: *Deleted

## 2011-03-20 NOTE — Telephone Encounter (Signed)
Message copied by Baldomero Lamy on Fri Mar 20, 2011  1:08 PM ------      Message from: Tula Nakayama      Created: Fri Mar 20, 2011  5:56 AM       pls explain to pt it is impt he fill his thyroid med every month, he appears to have missed the whole month of March, and his labwork reflects this, his body needs the thyroid pill every day

## 2011-03-23 ENCOUNTER — Telehealth: Payer: Self-pay | Admitting: *Deleted

## 2011-03-23 ENCOUNTER — Encounter: Payer: Self-pay | Admitting: Family Medicine

## 2011-03-23 NOTE — Telephone Encounter (Signed)
Message copied by Baldomero Lamy on Mon Mar 23, 2011 10:52 AM ------      Message from: Tula Nakayama      Created: Fri Mar 20, 2011  5:56 AM       pls explain to pt it is impt he fill his thyroid med every month, he appears to have missed the whole month of March, and his labwork reflects this, his body needs the thyroid pill every day

## 2011-03-24 ENCOUNTER — Encounter: Payer: Self-pay | Admitting: Family Medicine

## 2011-03-24 ENCOUNTER — Telehealth: Payer: Self-pay | Admitting: Family Medicine

## 2011-03-24 ENCOUNTER — Ambulatory Visit (INDEPENDENT_AMBULATORY_CARE_PROVIDER_SITE_OTHER): Payer: Medicare Other | Admitting: Family Medicine

## 2011-03-24 VITALS — BP 150/90 | HR 69 | Resp 16 | Wt 197.0 lb

## 2011-03-24 DIAGNOSIS — R5381 Other malaise: Secondary | ICD-10-CM

## 2011-03-24 DIAGNOSIS — I10 Essential (primary) hypertension: Secondary | ICD-10-CM

## 2011-03-24 DIAGNOSIS — E785 Hyperlipidemia, unspecified: Secondary | ICD-10-CM

## 2011-03-24 MED ORDER — LOSARTAN POTASSIUM 50 MG PO TABS: 50.0000 mg | ORAL_TABLET | Freq: Every day | ORAL | Status: DC

## 2011-03-24 MED ORDER — LOVASTATIN 40 MG PO TABS: 40.0000 mg | ORAL_TABLET | Freq: Every day | ORAL | Status: DC

## 2011-03-24 NOTE — Patient Instructions (Addendum)
F/u in 6 weeks.  You will have an additional blood pressure medication sent in separate from the one you are already taking, you need to continue that onealso. This is HYZAAR 50mg  one daily   You NEED to take your thyroid medication every day as prescribed.Your recent blood test shows you are not doing this TSH and chem 7 non fasting in 6 weeks.  Pls let Dr Loletta Specter ONLY prescribe your blood sugar medications. When you see him , pls get this sorted out with hiom. In the meantime , it is ok to take the glipizide 10mg  tablet that I prescribed. DO NOT take the one he prescribed at the same time

## 2011-03-24 NOTE — Telephone Encounter (Signed)
Patient in office today, will be addressed

## 2011-03-24 NOTE — Progress Notes (Signed)
  Subjective:    Patient ID: Chase [name], male    DOB: 24-Jan-1931, 77 y.o.   MRN: [id number]  HPI Pt in for re-evaluation of uncontrolled blood pressure.Spouse states he recently had it checked and it was high. She also requests a handicap sticker for her car on his behalf.He does have severe neuropathy with unsteADY GAIT. sHE HAS MANY QUESTIONS ABOUT HIS DIABETES MED, HAS GLIPIZIDE BOTH SHORT AND LONG ACTING AND JANUMET. i ADVISED HER TO USE THE SHORT ACTING TILL he saw the endocrinologist next week, and not the janumet     Review of Systems Denies recent fever or chills. Denies sinus pressure, nasal congestion, ear pain or sore throat. Denies chest congestion, productive cough or wheezing. Denies chest pains, palpitations, paroxysmal nocturnal dyspnea, orthopnea and leg swelling Denies abdominal pain, nausea, vomiting,diarrhea or constipation.  Denies rectal bleeding or change in bowel movement. Denies dysuria, frequency, hesitancy or incontinence. Denies skin break down or rash.        Objective:   Physical Exam    Patient alert and oriented and in no Cardiopulmonary distress.  HEENT: No facial asymmetry, EOMI, no sinus tenderness, TM's clear, Oropharynx pink and moist.  Neck supple no adenopathy.  Chest: Clear to auscultation bilaterally.  CVS: S1, S2 no murmurs, no S3.  ABD: Soft non tender. Bowel sounds normal.  Ext: No edema  [id number]  ROM spine, shoulders, hips and knees.Abnormal gait   Psych: Good eye contact, normal affect. Memory intact not anxious or depressed appearing.  CNS: CN 2-12 intact, power, tone  normal throughout.Decreased sensation in hands and feet     Assessment & Plan:  1. Hypertension: uncontrolled, add hyzaar 50mg  one daily to current med. 2. Type 2 diabetes uncontrolled: pt to have med management by the endocrinologistionly

## 2011-04-06 ENCOUNTER — Other Ambulatory Visit: Payer: Self-pay | Admitting: Family Medicine

## 2011-04-21 NOTE — Assessment & Plan Note (Signed)
[facility]   NAME:  Chase [name], KUBICEK NO.:  [id number]   MEDICAL RECORD NO.:  [id number]      DATE OF BIRTH:  January 30, 1931   PHYSICIAN:  Ricard Dillon, M.D. VISIT DATE:  12/31/2008                                   OFFICE VISIT   [name] is a gentleman who was seen here a month ago.  He had what was  felt to be venous stasis ulcers.  He has been using Silvadene and an Ace  wrap.  He is managed to cancel 2 different appointments.  He is here  today in followup.   On examination, there are 3 small open areas here.  All of these were  selectively debrided of some covering necrotic material.  The base of  these appears to be clean.  There is some surrounding erythema; however,  I cannot really tell whether this is a part of his venous stasis or  could represent a cellulitis.  He was already put on Septra for an upper  respiratory tract infection by his primary physician.  He had not had  previous ABIs that we went ahead and did on the left leg.  His ABI was  1.13.  He has at least biphasic waves.  Therefore, I do not think there  is any evidence of critical ischemia.   IMPRESSION:  Venous stasis wounds, left leg.  These underwent  debridements as noted above.  We have applied Puracol Ag dry dressing  and put him in a Profore Lite wrap.  I will see him back in a few days  to assess the degree of erythema here, one of these wounds was cultured.  As mentioned, he is already on antibiotics starting this morning.  Follow up of the area of erythema will be in 3 days, although this may  represent part of his venous stasis.           ______________________________  Ricard Dillon, M.D.     MGR/MEDQ  D:  12/31/2008  T:  01/01/2009  Job:  [id number]

## 2011-04-21 NOTE — Assessment & Plan Note (Signed)
[facility]   NAME:  Chase [name], Chase [name] NO.:  [id number]   MEDICAL RECORD NO.:  [id number]      DATE OF BIRTH:  February 25, 1931   PHYSICIAN:  Ricard Dillon, M.D. VISIT DATE:  02/21/2009                                   OFFICE VISIT   Mr. Chase [name] is a gentleman with varicose veins and resultant ulceration.  This has been considerably improved in terms of the areas on his left  medial leg.  He has had two open areas which we have been applying  Puracol and hydrogel under a Profore Lite wrap.  The original wound had  healed over and he has only been left with these two recalcitrant areas.   On examination, his temperature is 98.7.  The distal open area has  resolved.  He still has a small remaining open area on the left medial  lower extremity proximally which we have labeled #8.  The dimensions of  this wound is 0.7 x 0.5 x 0.2.  I manually removed some surface eschar  from this wound.  The base of this is cleaned and epithelialized  underneath.  I am hopeful we will be able to get closure of this in 2  weeks.   IMPRESSION:  Venous stasis ulceration one of these is resolved.  I will  continue with the Puracol, hydrogel under a Profore Lite.  We will see  him again in 10 days.  I am hopeful this will be resolved at that point.           ______________________________  Ricard Dillon, M.D.     MGR/MEDQ  D:  02/21/2009  T:  02/21/2009  Job:  [id number]

## 2011-04-21 NOTE — Assessment & Plan Note (Signed)
[facility]   NAME:  Chase, [name] NO.:  [id number]   MEDICAL RECORD NO.:  [id number]      DATE OF BIRTH:  March 31, 1931   PHYSICIAN:  Orlando Penner. Sevier, M.D.       VISIT DATE:                                   OFFICE VISIT   HISTORY:  This 77 year old black male has been followed for a venous  stasis ulceration on the medial aspect of his left ankle.  He was  essentially healed a week ago, but we put him in continued wraps because  of his lack of compression stockings.   He returns today as instructed bringing some OTC 20-30 type compression  stockings which appear quite satisfactory and adequate.   His wound is indeed healed.   He will today be released from the clinic for p.r.n. followup and is  instructed in the proper use of his compression stocking during all  waking hours.  He is told to wear this for at least an additional 6  weeks to allow the skin on the area to toughen and he is also instructed  regarding bathing and so forth to that area.   For the record, his blood pressure today was 135/81, pulse 79,  respirations 18, temperature 98.4.  Random blood glucose 124 mg/dL, this  was done at home by the patient.           ______________________________  Orlando Penner. London Pepper, M.D.     RES/MEDQ  D:  03/13/2009  T:  03/13/2009  Job:  [id number]

## 2011-04-21 NOTE — Assessment & Plan Note (Signed)
[facility]   NAME:  Chase, [name] NO.:  [id number]   MEDICAL RECORD NO.:  [id number]      DATE OF BIRTH:  Feb 11, 1931   PHYSICIAN:  Orlando Penner. Sevier, M.D.  VISIT DATE:  03/06/2009                                   OFFICE VISIT   HISTORY:  This 77 year old black male is followed for stasis ulcerations  on the gaiter area of the left lower extremity just proximal to his  medial malleolus.  He has been in treatment with Puracol, hydrogel, and  a Profore Lite wrap.   PHYSICAL EXAMINATION:  Today, blood pressure 144/83, pulse 80,  respirations 18, temperature 97.6, blood glucose at home this morning  174 mg/dL.   The area now has 2 tiny open areas persisting which are in aggregate  total of 0.7 x 0.2 x 0.2 cm.  They do include a small amount of loose  skin at their margins and some slough in the base.   IMPRESSION:  Satisfactory course of stasis wound, left lower extremity.   DISPOSITION:  The wound was selectively debrided of the small amount of  slough and loose skin.   It was then treated with reapplication of Puracol, hydrogel, and that  extremity returned to a Profore Lite cast.   The patient is brought in his compression hose and I have looked at  those today.  Certainly one pair is completely short and should be  discarded as is recommended to the patient.  The other pair appears it  will be satisfactory for use once we get his wound healed.           ______________________________  Orlando Penner London Pepper, M.D.     RES/MEDQ  D:  03/06/2009  T:  03/07/2009  Job:  [id number]

## 2011-04-21 NOTE — Assessment & Plan Note (Signed)
[facility]   NAME:  Chase [name], Chase [name] NO.:  [id number]   MEDICAL RECORD NO.:  [id number]      DATE OF BIRTH:  Oct 10, 1931   PHYSICIAN:  Viviann Spare. Nyoka Cowden, M.D.   VISIT DATE:  02/14/2009                                   OFFICE VISIT   HISTORY:  A 77 year old male with varicose vein and ulceration returns  for recheck today.  The patient's wounds have considerably improved  since last seen.  He is responding well to the Puracol and Profore light  dressings that have been used.  He denies any pain, discomfort, or odor.  There has not been any significant drainage.   PHYSICAL EXAMINATION:  VITAL SIGNS:  Temperature 98.7, pulse 68,  respirations 18, and blood pressure 167/83.  EXTREMITIES:  Wound measurements of the left medial lower extremity  showed nearly full healing of both wounds.  Wound measurements were  initially obtained with scab still intact.  However, upon removal of the  scab with sharp debridement, there was only a small ulcer on the lower  extremity proximally that was open.  There was only a pinpoint hole into  the scab of the 9th ulceration.   TREATMENT:  Sharp debridement was carried out under no anesthesia.  The  wound appeared improved.  Fibrous tissue and scab were removed with  selective debridement of less than 20 centimeter squared by using  scalpel and forceps.  No bleeding was encountered.  The patient  tolerated the procedure well.   Following sharp debridement, we reapplied Puracol to the proximal  lesion, hydrogel, and a Profore light dressing.  The patient was advised  to return in 1 week for recheck.   ICD-9 code 454.0 varicose veins with ulcer.  CPT code [phone] and [phone] selective debridement less than 20 centimeter  squared.      Viviann Spare Nyoka Cowden, M.D.  Electronically Signed     AGG/MEDQ  D:  02/14/2009  T:  02/14/2009  Job:  [id number]

## 2011-04-21 NOTE — Assessment & Plan Note (Signed)
[facility]   NAME:  LINDBURGH, BLUEMEL NO.:  [id number]   MEDICAL RECORD NO.:  [id number]      DATE OF BIRTH:  11-27-1931   PHYSICIAN:  Ricard Dillon, M.D.      VISIT DATE:                                   OFFICE VISIT   Mr. Chase [name] is a gentleman we have been following for a venous stasis  ulceration on the right leg.  His wound has largely closed over.  However, there are 2 small open areas within what was the original  wound.  Both of these underwent a nonselective debridement today to  remove surface eschar.  There is nothing that needed culturing from  these wounds.   The last visit 3 days ago, there was some surrounding erythema here that  made me worry about cellulitis.  I think we did a blind culture of 1 of  the wounds on the left leg.  He was already on Septra prescribed him for  an upper respiratory tract infection.  The area itself is considerably  better.  There is no erythema.  The culture that I did the other day  shows no white cells, no organisms.   IMPRESSION:  1. Venous stasis ulceration.  We reapplied Puracol AG to these 2 small      open areas.  Both of them underwent debridement noted above.  The      worry about cellulitis last time looks completely resolved.  2. Cellulitis, leg.  This seems under control with his existing      antibiotics, which I encouraged him to complete (7 days).           ______________________________  Ricard Dillon, M.D.     MGR/MEDQ  D:  01/03/2009  T:  01/03/2009  Job:  [id number]

## 2011-04-21 NOTE — Assessment & Plan Note (Signed)
[facility]   NAME:  Chase [name], Chase [name] NO.:  [id number]   MEDICAL RECORD NO.:  [id number]      DATE OF BIRTH:  [birth year]   PHYSICIAN:  Ricard Dillon, M.D. VISIT DATE:  01/10/2009                                   OFFICE VISIT   Chase [name] is a gentleman who we have been following for venous stasis  ulceration on the right leg.  The wound is considerably better when we  saw him last time.  There were too small open areas within what was the  original wound.  He had been completing antibiotics.  I did culture the  wound recently; however, no organisms were ultimately cultured.   On examination, neither one of the areas really has improved much  although they are bridged.  I do not have a good sense that they are  improving.  The distal wound had a somewhat purulent looking base, which  I cultured, although there was no overt cellulitis.  The proximal wound  was also debrided.  It has a bridge of skin between it.   IMPRESSION:  Venous stasis ulceration:  I changed to Silverlon from a  collagen-based dressing.  We will rewrap him in an Unna wrap.  There was  no overt cellulitis, although I have repeated a culture.           ______________________________  Ricard Dillon, M.D.     MGR/MEDQ  D:  01/10/2009  T:  01/10/2009  Job:  [id number]

## 2011-04-21 NOTE — Consult Note (Signed)
NAME:  Chase [name], Chase [name] NO.:  [id number]   MEDICAL RECORD NO.:  [id number]          PATIENT TYPE:  REC   LOCATION:  FOOT                         FACILITY:  [facility]   PHYSICIAN:  Viviann Spare. Nyoka Cowden, M.D.  DATE OF BIRTH:  Jul 31, 1931   DATE OF CONSULTATION:  11/23/2008  DATE OF DISCHARGE:                                 CONSULTATION   REFERRING PHYSICIAN:  Norwood Levo. Moshe Cipro, MD   PROBLEM:  Recurrent painful venous stasis ulcers of the left leg.   HISTORY:  A 77 year old male returns to [place]  for recurrent painful ulcerations of the left lower leg.  The patient  has been seen here as recently as 9 months ago.  He healed his  ulcerations at that time with the help of an Haematologist.   The patient is diabetic and has been careless about managing his  chronically swollen legs that are prone to ulceration.  He admits that  he wears his compression stockings irregularly and that he does not  control his diabetes because he eats too richly.  The patient is aware  of the relation of his chronic leg swelling and abnormal diabetic values  to the lack of control of his wounds.   PAST MEDICAL HISTORY:  1. Additional diagnoses of chronic venous stasis disease with a      chronic edema.  2. Peripheral vascular disease.  3. Hypertension.  4. Non-insulin-dependent diabetes mellitus which is poorly controlled.  5. Hypothyroidism.  6. Hyperlipidemia.  7. Osteoarthritis with diminished strength in his hands.  8. Benign prostatic hypertrophy with outlet obstruction.   PREVIOUS SURGERY:  Left kidney for kidney stones.   SOCIAL HISTORY:  The patient previously was employed for [employer].  He denies any history of tobacco use.  He is not a  drinker.  He is retired.   FAMILY HISTORY:  Pertinent for a mother and brother that are both on  dialysis.   REVIEW OF SYSTEMS:  GENERAL:  Weight has been stable although he is  somewhat overweight.   HEAD, EYES, EARS, NOSE, AND THROAT:  Some  diminished vision.  Hearing seems normal.  Denies any tinnitus.  CARDIOPULMONARY:  Some shortness of breath with exertion.  He says his  heart has been reported to him as the heart of a 77 year old.  He  denies chest pain or palpitations and has no prior history of cardiac  problems.  He does carry a prior history of vascular problems.  It is  uncertain what evaluation has been done to reach his diagnosis in the  past.  GASTROINTESTINAL:  Denies heartburn, nausea, vomiting, previous  history of ulcer, liver disease, jaundice, hepatitis or gallbladder  disease, denies any history of pancreatic problems.  GENITOURINARY:  Some nocturia and increased frequency is on medications at the present  time for benign prostatic hypertrophy with some outlet obstruction  problems.  MUSCULOSKELETAL:  Diminished grip in his hands with arthritic  complaints, has mild back discomfort.  Denies any other significant  joint problems.  NEUROLOGIC:  Denies significant problems with  headaches.  No history of stroke.  No focal weakness other than in his  hands due to his arthritis and paresis.  Denies history of small  strokes.  Denies numbness in his feet.   PHYSICAL EXAMINATION:  VITAL SIGNS:  Temperature 98.2, pulse 83,  respirations 20, blood pressure 169/95, capillary glucose 244 mg  percent, height 5 feet 8 inches, weight 207 pounds.  GENERAL:  Moderately overweight elderly male in no acute distress, is a  very poor historian.  SKIN:  Wound inspection in the left medial lower extremity shows chronic  edema and chronic stasis changes with darkening of the skin coloration.  There are several areas of ulceration present in the lower extremity.  Total area of involvement measured about 6.5 x 3 x 0.2 cm.  Wounds have  a yellow soft eschar in the base of several of them and there is some  surrounding callus on some of these wounds.  HEAD, EYES, EARS, NOSE AND THROAT:   Extraocular movements were full.  Conjunctivae clear.  Pupils equal, round, and reactive to light.  Oropharynx unremarkable.  Hearing grossly normal.  NECK:  Supple.  No thyromegaly, nodule, mass, or bruit.  CHEST:  Clear.  HEART:  Regular rhythm without gallop, murmur, click, or rub.  ABDOMEN:  Obese, nontender, no organomegaly.  MUSCULOSKELETAL:  Mild arthritic changes in the hands.  Heberden nodes  present in the fingers.  Chronic bilateral edema worse on the left.  NEUROLOGIC:  Intact sensation in the feet.  Cranial nerves intact.  Moves all extremities, balance shows a slightly broad-based gait.   PROBLEM:  Nonhealing ulcers of the left lower extremity secondary to  chronic venous stasis disease.   TREATMENT:  The patient had sharp debridement with a scalpel.  Several  of these shallow wounds were trimmed and debrided of  necrotic material.  There was no anesthesia use.  The patient tolerated the procedure well  and there was no bleeding   Following debridement, he had Silvadene and a bandage applied to the  left leg and this was covered with fish net.  We advised him to continue  to do this daily.   And ideal situation would probably allow [place] to use either Profore or  Unna boot for treatment of these wounds. With the upcoming Christmas  holiday and reduced opening times for the [facility], we felt that as a temporizing measure, he could go ahead  with Silvadene bandaging at home and wear compression hosiery that he  has on hand.  A significant amount of time was spent educating the  patient in the dynamics of wound healing and protection of his  chronically edematous legs, to return to clinic in approximately 2 weeks  for re-evaluation and possible application of additional compressive  dressings to be either Profore or Unna boot if necessary.   ICD-9 CODE:  454.0, varicose vein with stasis ulcer.  250.72, diabetes mellitus with peripheral  circulatory disorders.   CPT CODE NUMBER:  [id number].      Viviann Spare Nyoka Cowden, M.D.  Electronically Signed     AGG/MEDQ  D:  11/23/2008  T:  11/24/2008  Job:  [id number]   cc:   Norwood Levo. Moshe Cipro, M.D.

## 2011-04-21 NOTE — Assessment & Plan Note (Signed)
[facility]   NAME:  VESTA, HENDRIKS NO.:  [id number]   MEDICAL RECORD NO.:  [id number]      DATE OF BIRTH:  09/24/1931   PHYSICIAN:  Ricard Dillon, M.D.      VISIT DATE:                                   OFFICE VISIT   Mr. Terracina is a gentleman we have been following for venous stasis  ulceration on his right leg.  Considered there is improvement in the  overall appearance of these wounds.  He has some tiny areas inferiorly  and 2 open areas in the superior part of the circumferential wound.  All  of these required a light, non-excisional debridement to bruise some  callus and some surface slough.  Underneath the wound bed looks healthy.  The inferior part of this wound area is very superficial; however, the  superior part has some depth.   IMPRESSION:  Venous stasis ulceration.   I have continued the Silverlon, hydrogel, bulky dressing under a Profore  light wrap.  We will need to see if the areas will granulate further to  reduce the depth, especially the superior part of the wound.  Otherwise,  we may need an Oasis trial here.           ______________________________  Ricard Dillon, M.D.     MGR/MEDQ  D:  01/17/2009  T:  01/17/2009  Job:  [id number]

## 2011-04-21 NOTE — Assessment & Plan Note (Signed)
[facility]   NAME:  JRUE, ORMES NO.:  [id number]   MEDICAL RECORD NO.:  [id number]      DATE OF BIRTH:  [birth year]   PHYSICIAN:  Ricard Dillon, M.D.      VISIT DATE:                                   OFFICE VISIT   LOCATION:  [place].   Mr. Fallo is a gentleman we have been following for a venous ulceration  on his left leg.  This actually had been healing fairly well; however,  he has been left with 2 small open areas within what was the once the  original wound.  These are tiny, 1 at the superior aspect of the wound  and 1 at the inferior.  I have been applying collagen-based dressing  under a Profore Lite, most recently with Puracol and hydrogel.  He tells  me that he had some pain in the area of the wound yesterday that  essentially resolved on its own.   On examination, he is afebrile with a temperature of 91.  Both of the  areas were lightly debrided as usual of some surface slough.  We  reapplied the Puracol Ag and hydrogel under the Unna wrap.  If this does  not make progress, we will consider Oasis in order to try and improve  the granulation and especially the superior wounds.  I did review his  ABIs.  There was no evidence of ischemia, and in looking at the wound,  really no evidence of infection here.  Nothing suggested a cause of his  complaints of discomfort.           ______________________________  Ricard Dillon, M.D.     MGR/MEDQ  D:  01/31/2009  T:  01/31/2009  Job:  [id number]

## 2011-04-21 NOTE — Consult Note (Signed)
NAME:  Chase [name], Chase [name] NO.:  [id number]   MEDICAL RECORD NO.:  [id number]          PATIENT TYPE:  REC   LOCATION:  FOOT                         FACILITY:  [facility]   PHYSICIAN:  Orlando Penner. Sevier, M.D. DATE OF BIRTH:  03/11/1931   DATE OF CONSULTATION:  02/15/2008  DATE OF DISCHARGE:                                 CONSULTATION   HISTORY:  This 77 year old black male is seen in consultation for an  ulcer on the medial aspect of his distal left lower extremity.   He has been a patient here in the past with bilateral venous stasis  ulcers and was last seen in that regard with wound resolution on both  extremities on November 17, 2006.   He reports subsequent to that he was less than faithful in wearing his  compression stockings but that he remained healed for about 3 to 4  months.  At that point, he had some recurrence of the lesion on the  medial aspect of the left ankle and this for a time came and went.  It  would get temporarily better if not completely resolved and then would  recur.   Some 2 months ago, the ulcer recurred and worsened, and they began just  to treat it at home with dressings and so forth.  Meanwhile there was  apparently some delay in getting records transferred in order for him to  be seen here again.   Both the patient and his family report that in the interim while he has  been awaiting an appointment that his ulcer has improved somewhat.  Apparently during that time he has had some orthopedic difficulties in  both his hips and also his right knee and has been less ambulatory and  probably has spent more time with the leg elevated, which has been  fortunate.   Whatever, he is here today for our evaluation and continued treatment of  this left lower extremity lesion.   PAST MEDICAL HISTORY:  Shows no interim changes since recorded here at  the time of his previous treatments.  He has had no interim  hospitalizations and no major  illnesses and has had to his awareness no  change in medications.   PERSONAL HISTORY:  The patient is retired and lives with his wife.  He  does not smoke or use alcoholic beverages.  Has very little physical  activity now due to his orthopedic difficulties.   FAMILY HISTORY:  The patient reports that since he was last seen here he  has lost two brothers to death.  Both were younger than he.  Both had  diabetes (as does the patient), and one died having been for a  considerable period of time on dialysis and apparently died secondary to  a major infection in his vascular access.  The other had been  hospitalized for reasons that were less than clear, had improved and had  been sent home and died suddenly and unexpectedly at home.  A postmortem  examination was not done.   REVIEW OF SYSTEMS:  The only thing the patient will  report that seems to  be new since his last visit here are the orthopedic difficulties, and he  has pending appointments to see about those.  He denies any heart  problems or any cerebrovascular accident or anything of that nature.   His medications include Enablex 7.5 mg daily, amlodipine 5 mg daily,  furosemide 20 mg every other day, glipizide 20 mg every other day,  aspirin 81 mg every day, Synthroid 75 mcg daily, levothyroxine 125 mcg  daily, Lantus 13 units daily in the morning, Caduet 1 tablet daily.  In  addition, it is noted that he did have an influenza immunization in  October of 2008.   PHYSICAL EXAMINATION:  VITAL SIGNS:  Blood pressure 137/94, pulse 91 and  regular, respirations 18, temperature 98.6.  GENERAL:  This is an elderly black gentleman who appears his stated age,  appears somewhat sluggish, but is totally alert and in no acute  distress.  EXTREMITIES:  Examination is limited to the lower extremities.  The  right lower extremity shows palpable pulses, evidence of chronic venous  disease with stasis skin changes but no open ulcerations.    The left lower extremity likewise shows palpable pulses, chronic venous  skin changes, mild edema, and an open ulceration, rather superficial,  measuring 1.3 x 0.8 x 0.05 cm, this just proximal to the medial  malleolus on the left.  It has a clean base, and there is no surrounding  erythema or suggestion of infection.   IMPRESSION:  New venous stasis ulceration, left lower extremity, at this  point uncomplicated.   DISPOSITION:  The wound requires no debridement.   We will return the patient today to treatment with application of  Hydrogel  ________ patch, which is held in place with Steri-Strips, and  that extremity is then placed in an Unna wrap.   Followup visit will be here to the nurse in 1 week for change of  dressing and physician observation only if she feels it is indicated.   Return visit to the physician will be in 2 weeks.           ______________________________  Orlando Penner London Pepper, M.D.     RES/MEDQ  D:  02/15/2008  T:  02/16/2008  Job:  [id number]

## 2011-04-21 NOTE — Assessment & Plan Note (Signed)
[facility]   NAME:  Chase [name], Chase [name] NO.:  [id number]   MEDICAL RECORD NO.:  [id number]      DATE OF BIRTH:  06/23/1931   PHYSICIAN:  Joneen Boers A. Nils [name], M.D. VISIT DATE:  02/28/2008                                   OFFICE VISIT   SUBJECTIVE:  Chase [name] is a 77 year old man who we follow for stasis  ulcer treating him primarily with compression wraps.  He returns for  followup.  There has been no interim drainage, malodor, pain or fever.   The patient is currently being evaluated by the neurosurgeon and the  physical therapist for low back pain.   OBJECTIVE:  VITAL SIGNS:  Blood pressure is 132/70, respirations 16,  pulse rate 86, temperature 98.1, capillary blood glucose 124 mg%.  GENERAL:  He is accompanied by his wife and his son.  LEFT LOWER EXTREMITY:  Inspection of the left lower extremity shows that  the stasis ulcer is completely healed.  The foot is warm.  There is a  faint pulse.  There is no evidence of concurrent ischemia.   ASSESSMENT:  Resolved stasis ulcer.   PLAN:  We are discharging the patient from active management in the  wound center.  We have given him a prescription for bilateral below-the-  knee 30 to 40 mm open toe compression hose.  We have instructed him in  the use of these garments.  He is familiar with these garments as he has  worn them in the past.  We have given him an opportunity to ask  questions.  He seems to understand, indicates that he will be compliant.      Chase [name], M.D.  Electronically Signed     HAN/MEDQ  D:  02/28/2008  T:  02/28/2008  Job:  [id number]   cc:   Norwood Levo. Moshe Cipro, M.D.

## 2011-04-21 NOTE — Assessment & Plan Note (Signed)
[facility]   NAME:  BODYN, ROBUCK NO.:  [id number]   MEDICAL RECORD NO.:  [id number]      DATE OF BIRTH:  1931-01-21   PHYSICIAN:  Ricard Dillon, M.D. VISIT DATE:  02/07/2009                                   OFFICE VISIT   Mr. Yoho is a gentleman we have been following for a venous stasis  ulceration on his left leg.  It actually has been healing fairly well.  However, he has been left with 2 small open areas.  One was superficial  and the other had some depth to it.  I have been applying collagen-based  dressing with hydrogel under Profore.   On examination, he is afebrile with a temperature of 98.1.  Both of  these areas are lightly debrided with some surface slough.  Visually, I  feel like these wounds are improving.  Therefore, I have not considered  the alternatives at this point, which is Oasis.  However, we will try to  reassess this next week.  I continue to think that these small areas are  improving with the current regimen.  We will see him again next week.           ______________________________  Ricard Dillon, M.D.     MGR/MEDQ  D:  02/07/2009  T:  02/07/2009  Job:  [id number]

## 2011-04-21 NOTE — Assessment & Plan Note (Signed)
[facility]   NAME:  TAIWAN, MADDOCKS NO.:  [id number]   MEDICAL RECORD NO.:  [id number]      DATE OF BIRTH:  16-Aug-1931   PHYSICIAN:  Ricard Dillon, M.D.      VISIT DATE:                                   OFFICE VISIT   Mr. Berrigan is a gentleman we have been following for a venous ulceration  on his left leg.  The area had generally been healing fairly well.  However, he has been left with 2 small open areas within what was the  original wound.  These are small but unfortunately fairly deep.  I did  recent cultures of this earlier this month.  There were no organisms  cultured.   On examination, the 2 wounds we have labeled #8 and #9, they are small.  However, I have some depth to them.  Both of them underwent a light  debridement to remove surface slough and fibrinous material.  The base  of these appears clean.   IMPRESSION:  Venous stasis ulceration with 2 refractory open areas.  I  have continued with the collagen-based dressings under a Profore Lite.  There is no evidence of infection currently around these wounds and no  change in dressings today.           ______________________________  Ricard Dillon, M.D.     MGR/MEDQ  D:  01/24/2009  T:  01/25/2009  Job:  [id number]

## 2011-04-24 NOTE — Discharge Summary (Signed)
NAME:  Chase [name], Chase [name] NO.:  [id number]   MEDICAL RECORD NO.:  [id number]          PATIENT TYPE:  INP   LOCATION:  A222                          FACILITY:  [facility]   PHYSICIAN:  Chase [name], M.D. DATE OF BIRTH:  December 15, 1930   DATE OF ADMISSION:  09/01/2005  DATE OF DISCHARGE:  09/28/2006LH                                 DISCHARGE SUMMARY   PRIMARY CARE PHYSICIAN:  Chase [name]   DISCHARGE DIAGNOSES:  1.  Diabetes type 2, uncontrolled.  2.  Chase [name] with medications.  3.  Hypothyroidism, controlled.  4.  Hypertension, controlled.  5.  Hyperlipidemia, controlled.  6.  Chronic incontinence.  7.  Conjunctivitis.  8.  Peripheral neuropathy.  9.  Hypokalemia, resolved.  10. Chronic renal insufficiency.   DISPOSITION:  Discharged to home.   CONDITION ON DISCHARGE:  Stable.   DISCHARGE MEDICATIONS:  1.  Lantus 30 units subcutaneous twice daily at 8 a.m. and 8 p.m.  2.  Aspirin 81 mg daily.  3.  Levothyroxine 150 mcg daily.  4.  Amlodipine 5 mg daily.  5.  Simvastatin 40 mg daily.  6.  (Caduet 5/20 can be substituted for amlodipine and simvastatin).  7.  Enablex as advised by his urologist.  8.  Polysporin eye drops.  9.  Patient has been advised to stop Lasix, furosemide, Glipizide, and Actos      for the time-being until advised by his primary care physician.   HOSPITAL COURSE:  Please refer to admission history and physical.  This is  an elderly African-American man with a history of hypertension and diabetes  who apparently for some time now has been having poor glycemic control which  seems partly related to financial difficulties in getting his medication.  Has been getting samples from his physician's office, but has apparently  been very confused about how to take them.  When asked to bring in his  medications patient brought in multiple medications in different strengths  in the wrong containers and was not aware exactly what he was  taking.  Patient, in fact, was not taking Glipizide, was taking Actos twice a day in  a Glipizide container.  It is unclear whether patient was actually taking  Synthroid 75 mcg twice daily or Synthroid 175 mcg twice daily as he had two  sets of samples and could not clearly explain how he was taking them.  Patient also alleges he was taking Caduet 5/20 twice daily.   During this hospital admission patient's medications were stripped down to  the basics.  He was treated with Norvasc 5 mg daily and his blood pressure  remained normal throughout his hospital stay.  His lipid profile was checked  and it was well within normal limits.  His thyroid function was checked and  it was again well within normal limits.  His TSH was 3.2. His blood sugars,  however, were markedly uncontrolled. on Lantus 30 units twice daily he  continued to run blood sugars in the 200s.  Since patient is clinically not  very ill and it is a matter of medication  management, patient has been  placed on Lantus 30 units twice daily and has been given the advice that he  needs to normalize his blood sugars for a few months before making a second  attempt at controlling his blood sugars with oral antidiabetics.  Patient  has been advised that the best course of action to maintain a constant blood  pressure and diabetes control is to be taking the same brand of medications  all the time rather than switching from medication to medication because of  cost and the availability of samples.  He has been advised that it is best  to switch to generic medications and avoid samples for the time-being since  he may not always be able to get samples.  Patient's blood pressure is  adequately controlled on amlodipine 5 mg alone.   Course of treatment was explained to the patient and his wife but after  repeated explanations it seems that they will require many more explanations  before they understand clearly.  However, daughter who was  present does  understand clearly and she has agreed to arrange to have the medications  administered daily by one of the daughters since the medications only  require to be administered daily.  Lantus will be administered twice daily  by home health and adjusted by the primary care physician or the  nutritionist.  The daughters will also be taught eventually to administer  the Lantus twice daily since Chase [name] is unable to self administer insulin  and his wife is too squeamish.   DISCHARGE EXAM:  Today patient is perky, alert, and oriented.  Temperature is 97.8.  His  pulse is 73, respirations 20, blood pressure 125/72.  His fasting blood  sugar was 161.  His pre lunch blood sugar was 242.  His bedtime blood sugar  was 189.  His weight is recorded as 199 pounds.  He no longer has yellow  conjunctival discharge.  He has no redness of the eyes.  His chest is clear  to auscultation.  Cardiovascular system regular rhythm.  His abdomen is  obese and nontender.  His extremities are without edema or ulceration.   LABORATORIES:  White count 7.8, hemoglobin 12.4, platelets 174.  His sodium  is 137, potassium 3.6, chloride 106, CO2 28, glucose 136, BUN 13, creatinine  1.4.  (1.6 ON ADMISSION) His calcium is 8.7.  Liver function tests were  completely normal.  His hemoglobin A1c was 13.9.  His lipid panel showed a  total cholesterol of 128, triglyceride 49, HDL 59, LDL 59, and VLDL 10.  His  TSH was 3.2.   Renal Ultrasound: normal sized kidneys with cortical thining.   FOLLOWUP:  Patient is to follow up with his primary care physician, Dr.  Moshe Cipro, within one week and is to have home health follow-up on a twice  daily basis for insulin admisnitration and teaching.      Chase [name], M.D.  Electronically Signed     LC/MEDQ  D:  09/03/2005  T:  09/03/2005  Job:  [id number]

## 2011-04-24 NOTE — Cardiovascular Report (Signed)
[name]  Patient:    Chase [name], Chase [name]                         MRN: [id number] Adm. Date:  [id number] Attending:  Monico Blitz CC:         Tula Nakayama, M.D., [place], [place]                        Cardiac Catheterization  HISTORY:  A 77 year old male with atypical angina who has diabetes, hypertension, and hyperlipidemia who presented with five days of crescendo pain.  COMMENTS ABOUT PROCEDURE:  The patient tolerated the procedure well without complications and had good hemostasis following the procedure.  HEMODYNAMIC DATA:  Aorta post contrast 137/76.  LV post contrast 137/13.  ANGIOGRAPHIC DATA:  Left ventriculogram:  Performed in the 30-degree RAO projection.  Aortic valve is normal.  The mitral valve was normal.  The left ventricle appears normal in size.  Ejection fraction is estimated at 70%.  Coronary arteries arise and distribute normally.  No calcification is noted of significance. 1. The left main coronary artery appears normal. 2. The left anterior descending artery appears normal.  No significant    stenosis are noted. 3. The circumflex has two marginal arteries and appears normal. 4. The right coronary artery is a large dominant vessel and appears normal.  IMPRESSION: 1. Normal LV function. 2. Normal coronary arteries.  No significant coronary artery disease noted. DD:  05/04/00 TD:  05/04/00 Job: [id number]

## 2011-04-24 NOTE — H&P (Signed)
NAME:  Chase [name], Chase [name] NO.:  [id number]   MEDICAL RECORD NO.:  [id number]          PATIENT TYPE:  INP   LOCATION:  IC03                          FACILITY:  [facility]   PHYSICIAN:  Karlyn Agee, M.D. DATE OF BIRTH:  10-Jun-1931   DATE OF ADMISSION:  09/01/2005  DATE OF DISCHARGE:  LH                                HISTORY & PHYSICAL   PRIMARY CARE PHYSICIAN:  Norwood Levo. Moshe Cipro, M.D.   CHIEF COMPLAINT:  Uncontrolled blood sugar and renal insufficiency.   HISTORY OF PRESENT ILLNESS:  This is a 77 year old African American  gentleman, a patient of Dr. Tula Nakayama, who is currently being managed  by Dr. Mitzi Hansen B. Dill while Dr. Moshe Cipro is away.  The patient was seen in  the emergency room yesterday for a blood sugar elevated in the 400s,  received subcutaneous insulin and blood sugar came down into the 190s.  The  patient was re-evaluated by Dr. Kizzie Furnish in his office this morning and a check  of his blood sugar revealed it to be over 500.  He received long acting  insulin and was discharged home to follow up.  On a recheck this evening his  blood sugar was still in the 400s.  Because it is likely that this patient's  blood sugar has been elevated for an extended period of time and because the  patient is unable to self administer insulin and his wife is also unable to  administer insulin, Dr. Kizzie Furnish consulted the hospitalist service to arrange  for hospital admission for management of his blood pressure.  Also, when the  patient was seen in the emergency room yesterday, it was noted that his  serum creatinine was 1.7.  It is not clear whether this is acute or chronic  and this will be further evaluated at this admission.   This patient from a review of his medical records appears to have a long  history of uncontrolled diabetes due to noncompliance with medication and  noncompliance with diet.  The patient states that over the years he has  become increasingly  forgetful and is not really clear on how to take his  medications.  His wife also suffers from a nervous condition and says she is  not able to offer a great deal of assistance to him.  He takes his  medications according to a schedule written out by Dr. Moshe Cipro but she  indicates that he is very often noncompliant.  His last hemoglobin A1c, in  June 2006, was nine point something.   Currently, the patient complains of fuzzy thinking, episodic headaches,  blurring of vision.  He has recently visited an ophthalmologist who he says  told him that he does have glaucoma which does not need treatment and his  retina is fine.  He received eye drops for an eye infection.  He denies sore  throat.  He does have thyroid problems but it is unclear to what degree he  is compliant with his medication.  It is noted that he appears to be taking  the 75 mg twice daily  of Synthroid.  He denies any chest pains or shortness  of breath or dyspnea on exertion.  Denies any fever, cough, or cold.  Denies  nausea, vomiting, diarrhea, or constipation.  He does have urinary  incontinence and does have a history of kidney stones.  He is managed by a  urologist but full details of his urological condition are unclear.  He is  managed by Dr. Nils Pyle leg ulcers which have now healed.  He does have  numbness of his hands and feet which seems to be partially related to  diabetic neuropathy and partially related to bulging disk in his cervical  spine.  He has no history of strokes or syncope and no history of arthritis.   PAST MEDICAL HISTORY:  As well as delineated above, he has:  1.  Hypothyroidism.  2.  Diabetes, type 2.  3.  Hypertension.  4.  Urinary incontinence.  5.  Hyperlipidemia.   MEDICATIONS:  According to his medical chart, he takes:  1.  Glipizide 10 mg twice daily.  2.  Caduet 5/20 daily.  3.  Lasix 20 mg daily.  4.  Enlabex 7.5 mg daily.  5.  Synthroid 75 mcg twice daily.  6.  Aspirin 81 mg  daily.   ALLERGIES:  PENICILLIN.  He is unsure of the reaction.   SOCIAL HISTORY:  He lives with his wife.  There are no other people in the  home.  Denies tobacco, alcohol, or illicit drug use.   FAMILY HISTORY:  Strongly positive for hypertension, diabetes, and stroke.   REVIEW OF SYSTEMS:  On a ten-point review of systems other than noted above  was negative.   PHYSICAL EXAMINATION:  GENERAL:  This is a pleasant, elderly, African  American gentleman who looks young for his age.  He is in no distress.  VITAL SIGNS:  His temperature is 97.  His pulse is 75.  His blood pressure  is 133/76.  He is saturating at 99% on room air.  His respirations are 15.  HEENT:  His eyes are bulging, watery.  He has yellow discharge from the left  eye.  His pupils are round, equal, and reactive.  His mucous membranes are  pink.  He is not dehydrated.  There is no oral Candida.  CHEST:  He has coarse breath sounds, occasional rhonchi.  CARDIOVASCULAR:  Regular rhythm without murmurs.  ABDOMEN:  Obese, soft, and nontender.  There are no masses.  EXTREMITIES:  He has no edema but he has hyperpigmented changes suggestive  of vascular disease and changes associated with heel scars.  His pulses are  3+ bilaterally, dorsalis pedis.  Posterior tibial pulses are not felt.   LABS:  His white count is 6.7, hemoglobin 12.7, MCV 92.8, RDW 13.5,  platelets 166.  He has a normal differential on his white count.  His sodium  is 128.  His potassium 4.3, chloride 94, CO2 30, glucose 563, BUN 21,  creatinine 1.6.  His calcium is 8.9.  His liver function tests, drawn  yesterday, were normal.   ASSESSMENT:  1.  Uncontrolled diabetes.  2.  Pseudohyponatremia related to elevated blood sugars.  3.  Renal insufficiency, slightly improved since yesterday.  4.  Past history of cardiomyopathy with ejection fraction of 44%.  5.  Diabetic neuropathy.  6.  Noncompliance with medication due to impaired mental faculties as  well     as probably a lack of complete understanding of his diabetes.   PLAN:  As  per orders.      Karlyn Agee, M.D.  Electronically Signed     LC/MEDQ  D:  09/01/2005  T:  09/01/2005  Job:  [id number]

## 2011-04-24 NOTE — Procedures (Signed)
   NAME:  Chase [name], Chase [name] NO.:  [id number]   MEDICAL RECORD NO.:  [id number]                   PATIENT TYPE:  INP   LOCATION:                                       FACILITY:   PHYSICIAN:  Jacqulyn Ducking, M.D.               DATE OF BIRTH:  03-31-1931   DATE OF PROCEDURE:  05/01/2003                  AGE:  77  DATE OF DISCHARGE:  05/01/2003                  SEX:  M                                CARDIAC ULTRASOUND   REFERRING PHYSICIAN:  Baxter Hire, M.D. and Jacqulyn Ducking, M.D.   CLINICAL INFORMATION:  A 77 year old gentleman with symptoms suggestive of  unstable angina including substantial EKG abnormalities.   M-MODE:  AORTA:  3.0  (<4.0)  LEFT ATRIUM:  4.3  (<4.0)  SEPTUM:  1.7  (0.7-1.1)  POSTERIOR WALL:  1.6  (0.7-1.1)  LV-DIASTOLE:  3.8  (<5.7)   FINDINGS/IMPRESSION:  1. A technically suboptimal, but adequate echocardiographic study.  2. Normal right atrial size; mild left atrial enlargement.  Normal right     ventricular size and function with mild RVH.  3. Minimal aortic sclerosis with normal valve function.  4. Normal mitral valve; mild mitral annular calcification.  5. Normal pulmonic and tricuspid valves.  6. Normal internal dimension of the left ventricle; moderate LVH.  Wall     motion assessed with the assistance of intravenous echocardiographic     contrast.  No areas of akinesis nor dyskinesis noted.  Overall LV     systolic function appeared normal.                                               Jacqulyn Ducking, M.D.    RR/MEDQ  D:  05/01/2003  T:  05/02/2003  Job:  [id number]

## 2011-04-24 NOTE — Procedures (Signed)
NAME:  Chase [name], Chase [name] NO.:  [id number]   MEDICAL RECORD NO.:  [id number]          PATIENT TYPE:  INP   LOCATION:  A222                           FACILITY:   PHYSICIAN:  Leslye Peer, MD       DATE OF BIRTH:  08/31/1931   DATE OF PROCEDURE:  09/02/2005  DATE OF DISCHARGE:                                  ECHOCARDIOGRAM   REFERRING PHYSICIAN:  Karlyn Agee, M.D.   INDICATIONS:  Mr. Schmit is a 77 year old gentleman with past medical history  of diabetes mellitus, hypertension, renal failure and referred for  evaluation of LV function.   Technical quality of the study is somewhat limited secondary to patient body  habitus and poor acoustic __________.   Aorta measures within normal limits at 3.1 cm.   Left atrium is not well visualized and may be just mildly dilated.  The  patient appeared to be in sinus rhythm during this procedure.   The intraventricular septum and posterior wall are moderately thickened.   The aortic valve was not well visualized but appeared to be grossly  structurally normal.  Doppler interrogation of  the aortic valve suggested  mild aortic sclerosis.   The mitral valve also appeared grossly structurally normal.  No mitral valve  prolapse is noted.  Mild mitral regurgitation was noted.  Doppler  interrogation of mitral valve was within normal limits.   Pulmonic valve was not well visualized.   Tricuspid valve also was poorly visualized.  Mild tricuspid regurgitation  was present.   The left ventricle subjectively appeared normal in size with normal left  ventricular systolic function.  No obvious regional wall motion  abnormalities were noted.  Presence of diastolic dysfunction was inferred  for pulsar Doppler plus mitral valve.   The right sided structures were not well visualized.  The right ventricular  systolic function appeared to be normal.   IMPRESSION:  1.  Possibly mild left atrial enlargement.  2.  Mild to  moderate concentric left ventricular hypertrophy.  3.  Mild mitral and tricuspid regurgitation.  4.  Mild aortic sclerosis without grossly notable aortic stenosis although      the aortic valve was not well visualized.  5.  Normal left ventricle size and systolic function without regional wall      motion abnormality noted.  6.  The presence of diastolic dysfunction was inferred from pulsar Doppler      plus mitral valve.           ______________________________  Leslye Peer, MD     AB/MEDQ  D:  09/02/2005  T:  09/03/2005  Job:  [id number]

## 2011-04-30 ENCOUNTER — Other Ambulatory Visit: Payer: Self-pay

## 2011-05-01 ENCOUNTER — Telehealth: Payer: Self-pay

## 2011-05-01 NOTE — Telephone Encounter (Signed)
Faxed to pharmacy and also a note to Dr Carlis Abbott

## 2011-05-01 NOTE — Telephone Encounter (Signed)
Have pt call Dr. Iran Planas for the blood sugar meds, I am not prescribin g them and  let pharmacy know also

## 2011-07-10 ENCOUNTER — Encounter: Payer: Self-pay | Admitting: Family Medicine

## 2011-07-13 ENCOUNTER — Encounter: Payer: Self-pay | Admitting: Family Medicine

## 2011-07-13 ENCOUNTER — Ambulatory Visit (INDEPENDENT_AMBULATORY_CARE_PROVIDER_SITE_OTHER): Payer: Medicare Other | Admitting: Family Medicine

## 2011-07-13 VITALS — BP 158/88 | HR 77 | Resp 16 | Ht 67.0 in | Wt 196.8 lb

## 2011-07-13 DIAGNOSIS — R296 Repeated falls: Secondary | ICD-10-CM | POA: Insufficient documentation

## 2011-07-13 DIAGNOSIS — F3289 Other specified depressive episodes: Secondary | ICD-10-CM

## 2011-07-13 DIAGNOSIS — Z9181 History of falling: Secondary | ICD-10-CM

## 2011-07-13 DIAGNOSIS — E039 Hypothyroidism, unspecified: Secondary | ICD-10-CM

## 2011-07-13 DIAGNOSIS — F329 Major depressive disorder, single episode, unspecified: Secondary | ICD-10-CM

## 2011-07-13 DIAGNOSIS — I10 Essential (primary) hypertension: Secondary | ICD-10-CM

## 2011-07-13 DIAGNOSIS — E1169 Type 2 diabetes mellitus with other specified complication: Secondary | ICD-10-CM

## 2011-07-13 DIAGNOSIS — E119 Type 2 diabetes mellitus without complications: Secondary | ICD-10-CM

## 2011-07-13 DIAGNOSIS — E785 Hyperlipidemia, unspecified: Secondary | ICD-10-CM

## 2011-07-13 DIAGNOSIS — R269 Unspecified abnormalities of gait and mobility: Secondary | ICD-10-CM

## 2011-07-13 MED ORDER — LOSARTAN POTASSIUM 100 MG PO TABS: 100.0000 mg | ORAL_TABLET | Freq: Every day | ORAL | Status: DC

## 2011-07-13 MED ORDER — FLUOXETINE HCL 10 MG PO TABS: 10.0000 mg | ORAL_TABLET | Freq: Every day | ORAL | Status: DC

## 2011-07-13 NOTE — Progress Notes (Signed)
  Subjective:    Patient ID: Chase [name], male    DOB: 1931/08/28, 77 y.o.   MRN: [id number]  HPI C/o increased gait instability, weakness in the hands and recurrent falls in the home. Has walker with a bench, has decided on pursuing obtaining a power wheelchair for home use, which  I think is a valid option. Falls once or twice each month in the hiome. Currently his legs are great, and will call vascular surgeon/wound doc as needed. Still has a lot of urinry incontinence, sees urology for this. Does not test as he should, avg twice weekly, never under 200, endocrine treating his diabetes   Review of Systems Denies recent fever or chills. Denies sinus pressure, nasal congestion, ear pain or sore throat. Denies chest congestion, productive cough or wheezing. Denies chest pains, palpitations and leg swelling Denies abdominal pain, nausea, vomiting,diarrhea or constipation.   Denies dysuria, frequency, hesitancy or incontinence. Chronic back pain and unsteady gait Denies headaches or  Seizures,has  Numbness in hands and feet Denies uncontrolled depression, anxiety or insomnia. Denies skin break down or rash.        Objective:   Physical Exam Patient alert and oriented and in no cardiopulmonary distress.  HEENT: No facial asymmetry, EOMI, no sinus tenderness,  oropharynx pink and moist.  Neck supple no adenopathy.  Chest: Clear to auscultation bilaterally.  CVS: S1, S2 no murmurs, no S3.  ABD: Soft non tender. Bowel sounds normal.  Ext: No edema  MS: decreased  ROM spine,adequate in  shoulders, hips and knees.Abnormal gait  Skin: Intact, no ulcerations or rash noted.  Psych: Good eye contact, normal affect. Memory impaired not anxious or depressed appearing.  CNS: CN 2-12 intact,reduced strength in upper extremities with weak grip and reduced sensation in fingers. Reduced sensation in feet        Assessment & Plan:

## 2011-07-13 NOTE — Patient Instructions (Addendum)
F/u in 2.5 months.  Your blood pressure is too high, you need to INCREASE the losartn to 50mg  TWO every day, till done. Your new med will be losartan 100mg  ONE daily  You will be referred for physical therapy evaluation for a scooter, I believe that you need one  Your skin is great!!!  For pain I recommend no alleve, tramadol 50mg  up to two tablets daily

## 2011-07-13 NOTE — Assessment & Plan Note (Signed)
Medication compliance addressed. Commitment to regular exercise and healthy  food choices, with portion control discussed. DASH diet and low fat diet discussed and literature offered. Changes in medication made at this visit.  

## 2011-07-15 NOTE — Assessment & Plan Note (Signed)
Recurrent falls , need mobility device

## 2011-07-15 NOTE — Assessment & Plan Note (Addendum)
Needs updated lab to determine control, medication compliance a major challenge, hopefully family will become more involved in his care, spouse used to come in the past

## 2011-07-15 NOTE — Assessment & Plan Note (Signed)
Pt with neurologic damage, numbness in feet and toes as well as unsteady gait, frequent falls and lower extremity weakness, needs and will benefit from power mobility device in the home for safety, will refer for physical therapy eval before ordering

## 2011-07-15 NOTE — Assessment & Plan Note (Signed)
No current breakdown, follows up with wound center on an as needed basis

## 2011-07-15 NOTE — Assessment & Plan Note (Signed)
Controlled, no change in medication  

## 2011-07-15 NOTE — Assessment & Plan Note (Signed)
Uncontrolled, encouraged patient to be more diligent with recommended diet s wellas medications so that he has improved control. Followed by endocrine

## 2011-09-24 ENCOUNTER — Encounter: Payer: Self-pay | Admitting: Family Medicine

## 2011-09-30 ENCOUNTER — Encounter: Payer: Self-pay | Admitting: Family Medicine

## 2011-10-01 ENCOUNTER — Ambulatory Visit: Payer: Medicare Other | Admitting: Family Medicine

## 2011-10-02 ENCOUNTER — Encounter: Payer: Self-pay | Admitting: Family Medicine

## 2011-10-07 ENCOUNTER — Encounter: Payer: Self-pay | Admitting: Family Medicine

## 2011-10-07 ENCOUNTER — Ambulatory Visit (INDEPENDENT_AMBULATORY_CARE_PROVIDER_SITE_OTHER): Payer: Medicare Other | Admitting: Family Medicine

## 2011-10-07 VITALS — BP 168/82 | HR 72 | Resp 16 | Ht 66.5 in | Wt 187.8 lb

## 2011-10-07 DIAGNOSIS — I10 Essential (primary) hypertension: Secondary | ICD-10-CM

## 2011-10-07 DIAGNOSIS — Z23 Encounter for immunization: Secondary | ICD-10-CM

## 2011-10-07 DIAGNOSIS — E109 Type 1 diabetes mellitus without complications: Secondary | ICD-10-CM

## 2011-10-07 DIAGNOSIS — E1169 Type 2 diabetes mellitus with other specified complication: Secondary | ICD-10-CM

## 2011-10-07 DIAGNOSIS — E785 Hyperlipidemia, unspecified: Secondary | ICD-10-CM

## 2011-10-07 DIAGNOSIS — E039 Hypothyroidism, unspecified: Secondary | ICD-10-CM

## 2011-10-07 DIAGNOSIS — F329 Major depressive disorder, single episode, unspecified: Secondary | ICD-10-CM

## 2011-10-07 DIAGNOSIS — E119 Type 2 diabetes mellitus without complications: Secondary | ICD-10-CM

## 2011-10-07 DIAGNOSIS — F3289 Other specified depressive episodes: Secondary | ICD-10-CM

## 2011-10-07 MED ORDER — LOSARTAN POTASSIUM 100 MG PO TABS: 100.0000 mg | ORAL_TABLET | Freq: Every day | ORAL | Status: DC

## 2011-10-07 MED ORDER — AMLODIPINE BESYLATE 5 MG PO TABS: 5.0000 mg | ORAL_TABLET | Freq: Every day | ORAL | Status: DC

## 2011-10-07 MED ORDER — FLUOXETINE HCL 10 MG PO TABS: 10.0000 mg | ORAL_TABLET | Freq: Every day | ORAL | Status: DC

## 2011-10-07 NOTE — Patient Instructions (Addendum)
F/u in 4 months   Flu vaccine and TdaP today.  Lipid, cmp and egfr, hba1C , tsh today  Pls fill the bP med losartan, your bP is high  [employer] about probs with your sugar

## 2011-10-08 LAB — COMPLETE METABOLIC PANEL WITH GFR
ALT: 12 U/L (ref 0–53)
AST: 23 U/L (ref 0–37)
Albumin: 4 g/dL (ref 3.5–5.2)
Alkaline Phosphatase: 77 U/L (ref 39–117)
BUN: 28 mg/dL — ABNORMAL HIGH (ref 6–23)
CO2: 26 mEq/L (ref 19–32)
Calcium: 10 mg/dL (ref 8.4–10.5)
Chloride: 107 mEq/L (ref 96–112)
Creat: 1.76 mg/dL — ABNORMAL HIGH (ref 0.50–1.35)
GFR, Est African American: 42 mL/min — ABNORMAL LOW (ref >90)
GFR, Est Non African American: 36 mL/min — ABNORMAL LOW (ref >90)
Glucose, Bld: 40 mg/dL — CL (ref 70–99)
Potassium: 4.4 mEq/L (ref 3.5–5.3)
Sodium: 141 mEq/L (ref 135–145)
Total Bilirubin: 0.5 mg/dL (ref 0.3–1.2)
Total Protein: 7 g/dL (ref 6.0–8.3)

## 2011-10-08 LAB — LIPID PANEL
Cholesterol: 188 mg/dL (ref 0–200)
HDL: 78 mg/dL (ref >39)
LDL Cholesterol: 103 mg/dL — ABNORMAL HIGH (ref 0–99)
Total CHOL/HDL Ratio: 2.4 Ratio
Triglycerides: 36 mg/dL (ref <150)
VLDL: 7 mg/dL (ref 0–40)

## 2011-10-08 LAB — HEMOGLOBIN A1C
Hgb A1c MFr Bld: 7.5 % — ABNORMAL HIGH (ref <5.7)
Mean Plasma Glucose: 169 mg/dL — ABNORMAL HIGH (ref <117)

## 2011-10-08 LAB — TSH: TSH: 4.036 u[IU]/mL (ref 0.350–4.500)

## 2011-10-11 NOTE — Assessment & Plan Note (Signed)
Totally healed at this time

## 2011-10-11 NOTE — Assessment & Plan Note (Signed)
Improved, now followed by endo

## 2011-10-11 NOTE — Assessment & Plan Note (Signed)
Uncontrolled due to med non compliance

## 2011-10-11 NOTE — Progress Notes (Signed)
  Subjective:    Patient ID: Chase [name], male    DOB: [birth year], 77 y.o.   MRN: [id number]  HPI The PT is here for follow up and re-evaluation of chronic medical conditions, medication management and review of any available recent lab and radiology data.  Preventive health is updated, specifically  Cancer screening and Immunization.   Questions or concerns regarding consultations or procedures which the PT has had in the interim are  addressed. The PT denies any adverse reactions to current medications since the last visit.  There are no new concerns.      Review of Systems Denies recent fever or chills. Denies sinus pressure, nasal congestion, ear pain or sore throat. Denies chest congestion, productive cough or wheezing. Denies chest pains, palpitations and leg swelling Denies abdominal pain, nausea, vomiting,diarrhea or constipation.   Denies dysuria, frequency, hesitancy or incontinence. c/o joint pain, swelling and limitation in mobility.with frequent falls Denies headaches or  Seizures,c/o  Numbness and  Tingling, was recently offered steroid injection for this but he is putting this off Denies uncontrolled  depression, anxiety or insomnia. Denies skin break down or rash.        Objective:   Physical Exam  Patient alert and oriented and in no cardiopulmonary distress.  HEENT: No facial asymmetry, EOMI, no sinus tenderness,  oropharynx pink and moist.  Neck supple no adenopathy.  Chest: Clear to auscultation bilaterally.  CVS: S1, S2 no murmurs, no S3.  ABD: Soft non tender. Bowel sounds normal.  Ext: No edema  MS: decreased  ROM spine, shoulders, hips and knees.  Skin: Intact, no ulcerations or rash noted.  Psych: Good eye contact, normal affect. Memory intact not anxious or depressed appearing.  CNS: CN 2-12 intact, power, tone and sensation normal throughout.       Assessment & Plan:

## 2011-10-11 NOTE — Assessment & Plan Note (Signed)
Improved and controlled  

## 2011-10-11 NOTE — Assessment & Plan Note (Signed)
Controlled, no change in medication  

## 2011-10-14 NOTE — Progress Notes (Signed)
Faxed to dr. Loletta Specter

## 2011-11-10 ENCOUNTER — Other Ambulatory Visit: Payer: Self-pay | Admitting: Family Medicine

## 2011-12-15 DIAGNOSIS — R269 Unspecified abnormalities of gait and mobility: Secondary | ICD-10-CM | POA: Diagnosis not present

## 2011-12-15 DIAGNOSIS — M159 Polyosteoarthritis, unspecified: Secondary | ICD-10-CM | POA: Diagnosis not present

## 2011-12-15 DIAGNOSIS — E1142 Type 2 diabetes mellitus with diabetic polyneuropathy: Secondary | ICD-10-CM | POA: Diagnosis not present

## 2011-12-15 DIAGNOSIS — E1149 Type 2 diabetes mellitus with other diabetic neurological complication: Secondary | ICD-10-CM | POA: Diagnosis not present

## 2012-01-27 DIAGNOSIS — E1159 Type 2 diabetes mellitus with other circulatory complications: Secondary | ICD-10-CM | POA: Diagnosis not present

## 2012-01-27 DIAGNOSIS — E119 Type 2 diabetes mellitus without complications: Secondary | ICD-10-CM | POA: Diagnosis not present

## 2012-02-04 ENCOUNTER — Encounter: Payer: Self-pay | Admitting: Family Medicine

## 2012-02-04 ENCOUNTER — Ambulatory Visit (INDEPENDENT_AMBULATORY_CARE_PROVIDER_SITE_OTHER): Payer: Medicare Other | Admitting: Family Medicine

## 2012-02-04 VITALS — BP 170/84 | HR 92 | Resp 16 | Ht 67.0 in | Wt 208.4 lb

## 2012-02-04 DIAGNOSIS — I10 Essential (primary) hypertension: Secondary | ICD-10-CM

## 2012-02-04 DIAGNOSIS — E039 Hypothyroidism, unspecified: Secondary | ICD-10-CM | POA: Diagnosis not present

## 2012-02-04 DIAGNOSIS — F3289 Other specified depressive episodes: Secondary | ICD-10-CM

## 2012-02-04 DIAGNOSIS — M6281 Muscle weakness (generalized): Secondary | ICD-10-CM | POA: Diagnosis not present

## 2012-02-04 DIAGNOSIS — E119 Type 2 diabetes mellitus without complications: Secondary | ICD-10-CM | POA: Diagnosis not present

## 2012-02-04 DIAGNOSIS — E109 Type 1 diabetes mellitus without complications: Secondary | ICD-10-CM | POA: Diagnosis not present

## 2012-02-04 DIAGNOSIS — R2681 Unsteadiness on feet: Secondary | ICD-10-CM | POA: Insufficient documentation

## 2012-02-04 DIAGNOSIS — R29898 Other symptoms and signs involving the musculoskeletal system: Secondary | ICD-10-CM | POA: Insufficient documentation

## 2012-02-04 DIAGNOSIS — R269 Unspecified abnormalities of gait and mobility: Secondary | ICD-10-CM | POA: Diagnosis not present

## 2012-02-04 DIAGNOSIS — E785 Hyperlipidemia, unspecified: Secondary | ICD-10-CM

## 2012-02-04 DIAGNOSIS — E1169 Type 2 diabetes mellitus with other specified complication: Secondary | ICD-10-CM

## 2012-02-04 DIAGNOSIS — F329 Major depressive disorder, single episode, unspecified: Secondary | ICD-10-CM

## 2012-02-04 MED ORDER — AMLODIPINE BESYLATE 10 MG PO TABS: 10.0000 mg | ORAL_TABLET | Freq: Every day | ORAL | Status: DC

## 2012-02-04 MED ORDER — GLIPIZIDE 10 MG PO TABS: 10.0000 mg | ORAL_TABLET | Freq: Two times a day (BID) | ORAL | Status: DC

## 2012-02-04 NOTE — Patient Instructions (Addendum)
F/u in 4 month  BLOOD pRESSURE is high, amlodipine dose is increased.  HBA1C, bmp, and TSH today You are referred for therapy because of right hand weakness and unsteady gait

## 2012-02-05 LAB — BASIC METABOLIC PANEL
BUN: 30 mg/dL — ABNORMAL HIGH (ref 6–23)
CO2: 25 mEq/L (ref 19–32)
Calcium: 9 mg/dL (ref 8.4–10.5)
Chloride: 108 mEq/L (ref 96–112)
Creat: 2.06 mg/dL — ABNORMAL HIGH (ref 0.50–1.35)
Glucose, Bld: 212 mg/dL — ABNORMAL HIGH (ref 70–99)
Potassium: 4.5 mEq/L (ref 3.5–5.3)
Sodium: 141 mEq/L (ref 135–145)

## 2012-02-05 LAB — TSH: TSH: 29.121 u[IU]/mL — ABNORMAL HIGH (ref 0.350–4.500)

## 2012-02-05 LAB — HEMOGLOBIN A1C
Hgb A1c MFr Bld: 7.9 % — ABNORMAL HIGH (ref <5.7)
Mean Plasma Glucose: 180 mg/dL — ABNORMAL HIGH (ref <117)

## 2012-02-07 NOTE — Assessment & Plan Note (Signed)
Deteriorated, explaine d to pt and spouse he would likely be more compliant with treatment if he saw more accesible local endo, states he will "think about this"

## 2012-02-07 NOTE — Assessment & Plan Note (Signed)
Uncontrolled due to medical non compliance

## 2012-02-07 NOTE — Progress Notes (Signed)
  Subjective:    Patient ID: Chase [name], male    DOB: 05-05-1931, 78 y.o.   MRN: [id number]  HPI The PT is here for follow up and re-evaluation of chronic medical conditions, medication management and review of any available recent lab and radiology data.  Preventive health is updated, specifically  Cancer screening and Immunization.   Questions or concerns regarding consultations or procedures which the PT has had in the interim are  addressed. The PT denies any adverse reactions to current medications since the last visit.  Not testnig sugars regularly, not compliant with diet, resists seeing local endo still. Wife reports increased difficulty with ambulation and worsening weakness of right hand, requests physical therapy evaluation    Review of Systems See HPI Denies recent fever or chills. Denies sinus pressure, nasal congestion, ear pain or sore throat. Denies chest congestion, productive cough or wheezing. Denies chest pains, palpitations and leg swelling Denies abdominal pain, nausea, vomiting,diarrhea or constipation.   Denies dysuria, frequency, hesitancy or incontinence.  Denies headaches, seizures, numbness, or tingling. Denies depression, anxiety or insomnia. Denies skin break down or rash.        Objective:   Physical Exam Patient alert and oriented and in no cardiopulmonary distress.  HEENT: No facial asymmetry, EOMI, no sinus tenderness,  oropharynx pink and moist.  Neck supple no adenopathy.  Chest: Clear to auscultation bilaterally.  CVS: S1, S2 no murmurs, no S3.  ABD: Soft non tender. Bowel sounds normal.  Ext: No edema  MS: decreased  ROM spine, shoulders, hips and knees.  Skin: Intact, no ulcerations or rash noted.  Psych: Good eye contact, normal affect. Memory impaired  not anxious or depressed appearing.  CNS: CN 2-12 intact,decreased grip,abnormal gait.Decreased sensation in glove and stocking distribution Diabetic Foot Check:  Appearance  -  calluses Skin - no unusual pallor or redness, severe onychomycosis Sensation -gdiminished  to light touch Monofilament testing -  Right - Great toe, medial, central, lateral ball and posterior foot diminished Left - Great toe, medial, central, lateral ball and posterior foot diminished Pulses Left - Dorsalis Pedis and Posterior Tibia diminished  Right - Dorsalis Pedis and Posterior Tibia diminished        Assessment & Plan:

## 2012-02-07 NOTE — Assessment & Plan Note (Signed)
Currently healed

## 2012-02-07 NOTE — Assessment & Plan Note (Signed)
Controlled, no change in medication  

## 2012-02-07 NOTE — Assessment & Plan Note (Signed)
Deteriorating , wil refer to PT, per request of spouse

## 2012-02-07 NOTE — Assessment & Plan Note (Signed)
Deteriorating increased difficulty with ADL's, PT eval

## 2012-02-07 NOTE — Assessment & Plan Note (Signed)
Uncontrolled, increase dose of amlodipine

## 2012-02-07 NOTE — Assessment & Plan Note (Signed)
Hyperlipidemia:Low fat diet discussed and encouraged.  Controlled when last checked

## 2012-02-18 DIAGNOSIS — IMO0001 Reserved for inherently not codable concepts without codable children: Secondary | ICD-10-CM | POA: Diagnosis not present

## 2012-02-18 DIAGNOSIS — R269 Unspecified abnormalities of gait and mobility: Secondary | ICD-10-CM | POA: Diagnosis not present

## 2012-02-22 DIAGNOSIS — IMO0001 Reserved for inherently not codable concepts without codable children: Secondary | ICD-10-CM | POA: Diagnosis not present

## 2012-02-22 DIAGNOSIS — R269 Unspecified abnormalities of gait and mobility: Secondary | ICD-10-CM | POA: Diagnosis not present

## 2012-02-24 DIAGNOSIS — IMO0001 Reserved for inherently not codable concepts without codable children: Secondary | ICD-10-CM | POA: Diagnosis not present

## 2012-02-24 DIAGNOSIS — R269 Unspecified abnormalities of gait and mobility: Secondary | ICD-10-CM | POA: Diagnosis not present

## 2012-02-26 DIAGNOSIS — R269 Unspecified abnormalities of gait and mobility: Secondary | ICD-10-CM | POA: Diagnosis not present

## 2012-02-26 DIAGNOSIS — IMO0001 Reserved for inherently not codable concepts without codable children: Secondary | ICD-10-CM | POA: Diagnosis not present

## 2012-02-29 DIAGNOSIS — R269 Unspecified abnormalities of gait and mobility: Secondary | ICD-10-CM | POA: Diagnosis not present

## 2012-02-29 DIAGNOSIS — IMO0001 Reserved for inherently not codable concepts without codable children: Secondary | ICD-10-CM | POA: Diagnosis not present

## 2012-03-02 DIAGNOSIS — IMO0001 Reserved for inherently not codable concepts without codable children: Secondary | ICD-10-CM | POA: Diagnosis not present

## 2012-03-02 DIAGNOSIS — R269 Unspecified abnormalities of gait and mobility: Secondary | ICD-10-CM | POA: Diagnosis not present

## 2012-03-04 DIAGNOSIS — R443 Hallucinations, unspecified: Secondary | ICD-10-CM | POA: Diagnosis not present

## 2012-03-04 DIAGNOSIS — F29 Unspecified psychosis not due to a substance or known physiological condition: Secondary | ICD-10-CM | POA: Diagnosis not present

## 2012-03-05 ENCOUNTER — Other Ambulatory Visit: Payer: Self-pay

## 2012-03-05 ENCOUNTER — Emergency Department (HOSPITAL_COMMUNITY)
Admission: EM | Admit: 2012-03-05 | Discharge: 2012-03-05 | Disposition: A | Discharge status: Home / Self Care | Payer: Medicare Other | Attending: Emergency Medicine | Admitting: Emergency Medicine

## 2012-03-05 ENCOUNTER — Emergency Department (HOSPITAL_COMMUNITY): Payer: Medicare Other

## 2012-03-05 ENCOUNTER — Encounter (HOSPITAL_COMMUNITY): Payer: Self-pay | Admitting: Emergency Medicine

## 2012-03-05 DIAGNOSIS — E119 Type 2 diabetes mellitus without complications: Secondary | ICD-10-CM | POA: Diagnosis not present

## 2012-03-05 DIAGNOSIS — E079 Disorder of thyroid, unspecified: Secondary | ICD-10-CM | POA: Diagnosis not present

## 2012-03-05 DIAGNOSIS — Z7982 Long term (current) use of aspirin: Secondary | ICD-10-CM | POA: Insufficient documentation

## 2012-03-05 DIAGNOSIS — I10 Essential (primary) hypertension: Secondary | ICD-10-CM | POA: Diagnosis not present

## 2012-03-05 DIAGNOSIS — R209 Unspecified disturbances of skin sensation: Secondary | ICD-10-CM | POA: Diagnosis not present

## 2012-03-05 DIAGNOSIS — E039 Hypothyroidism, unspecified: Secondary | ICD-10-CM | POA: Diagnosis not present

## 2012-03-05 DIAGNOSIS — R569 Unspecified convulsions: Secondary | ICD-10-CM | POA: Diagnosis not present

## 2012-03-05 DIAGNOSIS — R4182 Altered mental status, unspecified: Secondary | ICD-10-CM | POA: Diagnosis not present

## 2012-03-05 DIAGNOSIS — R42 Dizziness and giddiness: Secondary | ICD-10-CM | POA: Diagnosis not present

## 2012-03-05 LAB — CBC
HCT: 36.3 % — ABNORMAL LOW (ref 39.0–52.0)
Hemoglobin: 12.2 g/dL — ABNORMAL LOW (ref 13.0–17.0)
MCH: 31.8 pg (ref 26.0–34.0)
MCHC: 33.6 g/dL (ref 30.0–36.0)
MCV: 94.5 fL (ref 78.0–100.0)
Platelets: 240 10*3/uL (ref 150–400)
RBC: 3.84 MIL/uL — ABNORMAL LOW (ref 4.22–5.81)
RDW: 13.6 % (ref 11.5–15.5)
WBC: 6.7 10*3/uL (ref 4.0–10.5)

## 2012-03-05 LAB — BASIC METABOLIC PANEL
BUN: 29 mg/dL — ABNORMAL HIGH (ref 6–23)
CO2: 27 mEq/L (ref 19–32)
Calcium: 9.5 mg/dL (ref 8.4–10.5)
Chloride: 100 mEq/L (ref 96–112)
Creatinine, Ser: 2.22 mg/dL — ABNORMAL HIGH (ref 0.50–1.35)
GFR calc Af Amer: 31 mL/min — ABNORMAL LOW (ref >90)
GFR calc non Af Amer: 27 mL/min — ABNORMAL LOW (ref >90)
Glucose, Bld: 242 mg/dL — ABNORMAL HIGH (ref 70–99)
Potassium: 4 mEq/L (ref 3.5–5.1)
Sodium: 134 mEq/L — ABNORMAL LOW (ref 135–145)

## 2012-03-05 LAB — URINALYSIS, ROUTINE W REFLEX MICROSCOPIC
Bilirubin Urine: NEGATIVE
Glucose, UA: 250 mg/dL — AB
Ketones, ur: NEGATIVE mg/dL
Leukocytes, UA: NEGATIVE
Nitrite: NEGATIVE
Protein, ur: 100 mg/dL — AB
Specific Gravity, Urine: 1.025 (ref 1.005–1.030)
Urobilinogen, UA: 0.2 mg/dL (ref 0.0–1.0)
pH: 6 (ref 5.0–8.0)

## 2012-03-05 LAB — RAPID URINE DRUG SCREEN, HOSP PERFORMED
Amphetamines: NOT DETECTED
Barbiturates: NOT DETECTED
Benzodiazepines: NOT DETECTED
Cocaine: NOT DETECTED
Opiates: NOT DETECTED
Tetrahydrocannabinol: NOT DETECTED

## 2012-03-05 LAB — ETHANOL: Alcohol, Ethyl (B): <11 mg/dL (ref 0–11)

## 2012-03-05 LAB — URINE MICROSCOPIC-ADD ON

## 2012-03-05 MED ORDER — LEVETIRACETAM 500 MG/5ML IV SOLN
INTRAVENOUS | Status: AC
  Filled 2012-03-05: qty 10

## 2012-03-05 MED ORDER — SODIUM CHLORIDE 0.9 % IV SOLN
1000.0000 mg | Freq: Once | INTRAVENOUS | Status: AC
  Administered 2012-03-05: 1000 mg via INTRAVENOUS
  Filled 2012-03-05: qty 10

## 2012-03-05 MED ORDER — LEVETIRACETAM 500 MG PO TABS: 500.0000 mg | ORAL_TABLET | Freq: Two times a day (BID) | ORAL | Status: DC

## 2012-03-05 NOTE — ED Notes (Signed)
Patient's daughter and wife state that patient started not acting himself Thursday. States that he was disoriented, forgets where he is. Also reports it seems he is hallucinating and has pauses where he won't talk or look at anyone.

## 2012-03-05 NOTE — Discharge Instructions (Signed)
Driving and Equipment Restrictions Some medical problems make it dangerous to drive, ride a bike, or use machines. Some of these problems are:  A hard blow to the head (concussion).   Passing out (fainting).   Twitching and shaking (seizures).   Low blood sugar.   Taking medicine to help you relax (sedatives).   Taking pain medicines.   Wearing an eye patch.   Wearing splints. This can make it hard to use parts of your body that you need to drive safely.  HOME CARE   Do not drive until your doctor says it is okay.   Do not use machines until your doctor says it is okay.  You may need a form signed by your doctor (medical release) before you can drive again. You may also need this form before you do other tasks where you need to be fully alert. MAKE SURE YOU:  Understand these instructions.   Will watch your condition.   Will get help right away if you are not doing well or get worse.  Document Released: 12/31/2004 Document Revised: 11/12/2011 Document Reviewed: 04/02/2010 [facility] Patient Information 2012 [email or web address].[email or web address], Adult A seizure is when the body shakes uncontrollably (convulsion). It can be a scary experience. A seizure is not a diagnosis. It is a sign that something else may be wrong with brain and/or spinal cord (central nervous system). In the Emergency Department, your condition is evaluated. The seizure is then treated. You will likely need follow-up with your caregiver. You will possibly need further testing and evaluation. Your caregiver or the specialist to whom you are referred will determine if further treatment is needed. After a seizure, you may be confused, dazed and drowsy. These problems (symptoms) often follow a seizure. Medication given to treat the seizure may also cause some of these changes. The time following a seizure is known as a refractory period. Hospital admission is seldom required unless there are other conditions present such as trauma  or metabolic problems. Sometimes the seizure activity follows a fainting episode. This may have been caused by a brief drop in blood pressure. These fainting (syncopal) seizures are generally not a cause for concern.  HOME CARE INSTRUCTIONS   Follow up with your caregiver as suggested.   If any problems happen, get help right away.   Do not swim or drive until your caregiver says it is okay.  Document Released: 11/20/2000 Document Revised: 11/12/2011 Document Reviewed: 11/11/2011 [facility] Patient Information 2012 [place].

## 2012-03-05 NOTE — ED Provider Notes (Signed)
History     CSN: [id number]  Arrival date & time 03/05/12  0136   First MD Initiated Contact with Patient 03/05/12 0202      Chief Complaint  Patient presents with  . Altered Mental Status     The history is provided by the patient, the spouse and a relative (A video was also provided for my viewing).   is reported that the patient had several episodes tonight of "shaking".  This occurred initially it lasted about 3 minutes and EMS was called.  On EMS arrival the patient had normal mental status and normal neurologic exam and the family was told he was fine and the patient refused transport to the emergency department at that time.  He occurred a second time in a similar situation happened.  On the third time for which the previous times he returned to baseline mental status EMS was called and this time he was instructed to come to the ER for evaluation.  OB was obtained during this third episode of nausea and a video which appears to demonstrate a man sitting at a table with his heating utensil his left hand.  The left hand is shaking on the plate, man is staring straight ahead and is nonverbal and doesn't appear to respond to voice.  His eyes are open he appears occasionally have left sided facial twitching as well.  This occurs for several minutes at which point it stops.  I was told by the family that the patient immediately returned to normal mental status.  At this current time he reports he feels fine.  His had no recent head trauma.  He said no prior history of seizures or strokes.  He denies unilateral arm or leg weakness.  Family reports is now returned to baseline mental status.  The report is no relevance like this before in the past.  He has never seen a neurologist.  His had no recent changes in medications.  He does not do drugs or drink alcohol per family  Past Medical History  Diagnosis Date  . Diabetes mellitus   . Thyroid disorder   . Hypertension   . Peripheral vascular  disease, unspecified   . Unspecified hypothyroidism   . Depressive disorder, not elsewhere classified   . Obesity   . Other and unspecified hyperlipidemia   . Unspecified essential hypertension   . Pain in joint, lower leg   . Osteoarthrosis, unspecified whether generalized or localized, lower leg   . Derangement of meniscus, not elsewhere classified   . Lumbago   . Spondylosis of unspecified site without mention of myelopathy   . Spinal stenosis, unspecified region other than cervical   . Backache, unspecified   . Family history of diabetes mellitus     Past Surgical History  Procedure Date  . Kidney stones left x2 1975  . Kidney surgery     Family History  Problem Relation Age of Onset  . Diabetes Mother   . Prostate cancer Father   . Diabetes Sister   . Diabetes Brother   . Diabetes Brother   . Hypertension Brother   . Hypertension Brother   . Hypertension Brother     History  Substance Use Topics  . Smoking status: Never Smoker   . Smokeless tobacco: Not on file  . Alcohol Use: No      Review of Systems  All other systems reviewed and are negative.    Allergies  Bayer advanced aspirin and Penicillins  Home  Medications   Current Outpatient Rx  Name Route Sig Dispense Refill  . AMLODIPINE BESYLATE 10 MG PO TABS Oral Take 1 tablet (10 mg total) by mouth daily. 30 tablet 11    Dose increase effective 02/04/2012  . ASPIRIN 81 MG PO TBEC Oral Take 81 mg by mouth daily.      Marland Kitchen FLUOXETINE HCL 10 MG PO TABS Oral Take 1 tablet (10 mg total) by mouth daily. 30 tablet 4  . FUROSEMIDE 20 MG PO TABS  TAKE ONE TABLET BY MOUTH EVERY DAY 30 tablet 3  . GLIPIZIDE 10 MG PO TABS Oral Take 1 tablet (10 mg total) by mouth 2 (two) times daily. 60 tablet 11  . LEVETIRACETAM 500 MG PO TABS Oral Take 1 tablet (500 mg total) by mouth 2 (two) times daily. 60 tablet 0  . LEVOTHYROXINE SODIUM 200 MCG PO TABS Oral Take 1 tablet (200 mcg total) by mouth daily. Take one tablet daily  Monday through Friday, half tablet Saturday and Sunday 30 tablet 3  . LINAGLIPTIN 5 MG PO TABS Oral Take 5 mg by mouth daily.      Marland Kitchen LOSARTAN POTASSIUM 100 MG PO TABS Oral Take 1 tablet (100 mg total) by mouth daily. 30 tablet 4    Dose increase effective 07/13/2011. Pt to take TW ...  . LOVASTATIN 40 MG PO TABS Oral Take 1 tablet (40 mg total) by mouth at bedtime. Take  Two tablets by mouth at bedtime (dose change) 30 tablet 3  . OXYBUTYNIN CHLORIDE 5 MG PO TABS Oral Take 5 mg by mouth. Take two tablets by mouth three times a day     . TRAMADOL HCL 50 MG PO TABS  1-2 tabs every 6 hours as needed       BP 140/78  Pulse 60  Temp(Src) 97.9 F (36.6 C) (Oral)  Resp 14  Ht 5\' 5"  (1.651 m)  Wt 190 lb (86.183 kg)  BMI 31.62 kg/m2  SpO2 99%  Physical Exam  Nursing note and vitals reviewed. Constitutional: He is oriented to person, place, and time. He appears well-developed and well-nourished.  HENT:  Head: Normocephalic and atraumatic.  Eyes: EOM are normal. Pupils are equal, round, and reactive to light.  Neck: Normal range of motion.  Cardiovascular: Normal rate, regular rhythm, normal heart sounds and intact distal pulses.   Pulmonary/Chest: Effort normal and breath sounds normal. No respiratory distress.  Abdominal: Soft. He exhibits no distension. There is no tenderness.  Musculoskeletal: Normal range of motion.  Neurological: He is alert and oriented to person, place, and time.       5/5 strength in major muscle groups of  bilateral upper and lower extremities. Speech normal. No facial asymetry.  Normal finger to nose bilaterally  Skin: Skin is warm and dry.  Psychiatric: He has a normal mood and affect. Judgment normal.    ED Course  Procedures (including critical care time)  Labs Reviewed  CBC - Abnormal; Notable for the following:    RBC 3.84 (*)    Hemoglobin 12.2 (*)    HCT 36.3 (*)    All other components within normal limits  BASIC METABOLIC PANEL - Abnormal;  Notable for the following:    Sodium 134 (*)    Glucose, Bld 242 (*)    BUN 29 (*)    Creatinine, Ser 2.22 (*)    GFR calc non Af Amer 27 (*)    GFR calc Af Amer 31 (*)  All other components within normal limits  URINALYSIS, ROUTINE W REFLEX MICROSCOPIC - Abnormal; Notable for the following:    Glucose, UA 250 (*)    Hgb urine dipstick SMALL (*)    Protein, ur 100 (*)    All other components within normal limits  ETHANOL  URINE RAPID DRUG SCREEN (HOSP PERFORMED)  URINE MICROSCOPIC-ADD ON   Ct Head Wo Contrast  03/05/2012  *RADIOLOGY REPORT*  Clinical Data: Altered mental status.  Right-sided hand numbness and weakness.  Right leg numbness.  Falls.  Dizziness and weakness  CT HEAD WITHOUT CONTRAST  Technique:  Contiguous axial images were obtained from the base of the skull through the vertex without contrast.  Comparison: MRI brain 12/11/2009  Findings: Mild diffuse cerebral atrophy.  Low attenuation changes throughout the deep white matter consistent with small vessel ischemia.  Mild ventricular dilatation consistent with central atrophy.  Old lacunar infarcts in the basal ganglia.  No mass effect or midline shift.  No focal sulci effacement to suggest edema.  Gray-white matter junctions are distinct.  Basal cisterns are not effaced.  No evidence of acute intracranial hemorrhage. Vascular calcifications.  Mucosal thickening in the maxillary antra.  Visualized mastoid air cells are not opacified.  No depressed skull fractures.  IMPRESSION: Chronic atrophy and small vessel ischemic changes.  No acute intracranial abnormalities identified.  Original Report Authenticated By: Neale Burly, M.D.     1. Partial seizure       MDM  I personally reviewed a video to family brought in.  It appears to be a partial seizure.  He has a normal neurologic exam in the emergency department and is at baseline mental status.  His laboratory studies his tox studies and a CT scan of his head are all  within normal limits.  The patient was loaded with 1 g of IV Keppra.  We'll place the patient on 500 mg twice a day Keppra and asked the patient to follow up with a neurologist in the next several days.  Family has been instructed to take a video to the neurology appointment.  Family understands to return to the ER for new or worsening symptoms.  This does not appear to be a stroke or TIA based on the video shown.         Hoy Morn, MD 03/05/12 [phone]

## 2012-03-07 ENCOUNTER — Ambulatory Visit (INDEPENDENT_AMBULATORY_CARE_PROVIDER_SITE_OTHER): Payer: Medicare Other | Admitting: Family Medicine

## 2012-03-07 ENCOUNTER — Encounter: Payer: Self-pay | Admitting: Family Medicine

## 2012-03-07 ENCOUNTER — Telehealth: Payer: Self-pay | Admitting: Family Medicine

## 2012-03-07 VITALS — BP 160/84 | HR 70 | Resp 16 | Ht 67.0 in | Wt 200.0 lb

## 2012-03-07 DIAGNOSIS — E1165 Type 2 diabetes mellitus with hyperglycemia: Secondary | ICD-10-CM

## 2012-03-07 DIAGNOSIS — I10 Essential (primary) hypertension: Secondary | ICD-10-CM

## 2012-03-07 DIAGNOSIS — R569 Unspecified convulsions: Secondary | ICD-10-CM

## 2012-03-07 DIAGNOSIS — F329 Major depressive disorder, single episode, unspecified: Secondary | ICD-10-CM | POA: Diagnosis not present

## 2012-03-07 DIAGNOSIS — E039 Hypothyroidism, unspecified: Secondary | ICD-10-CM

## 2012-03-07 DIAGNOSIS — N058 Unspecified nephritic syndrome with other morphologic changes: Secondary | ICD-10-CM

## 2012-03-07 DIAGNOSIS — G40209 Localization-related (focal) (partial) symptomatic epilepsy and epileptic syndromes with complex partial seizures, not intractable, without status epilepticus: Secondary | ICD-10-CM | POA: Insufficient documentation

## 2012-03-07 DIAGNOSIS — IMO0002 Reserved for concepts with insufficient information to code with codable children: Secondary | ICD-10-CM

## 2012-03-07 DIAGNOSIS — F3289 Other specified depressive episodes: Secondary | ICD-10-CM

## 2012-03-07 DIAGNOSIS — E1129 Type 2 diabetes mellitus with other diabetic kidney complication: Secondary | ICD-10-CM

## 2012-03-07 MED ORDER — LEVOTHYROXINE SODIUM 150 MCG PO CAPS: 150.0000 ug | ORAL_CAPSULE | Freq: Every day | ORAL | Status: DC

## 2012-03-07 MED ORDER — FLUOXETINE HCL 10 MG PO TABS: 10.0000 mg | ORAL_TABLET | Freq: Every day | ORAL | Status: DC

## 2012-03-07 MED ORDER — LOSARTAN POTASSIUM 100 MG PO TABS: 100.0000 mg | ORAL_TABLET | Freq: Every day | ORAL | Status: DC

## 2012-03-07 NOTE — Assessment & Plan Note (Signed)
Followed by endo, family had questions about tradjenta I advised they contact the treating MD

## 2012-03-07 NOTE — Assessment & Plan Note (Signed)
Controlled, no change in medication  

## 2012-03-07 NOTE — Progress Notes (Signed)
  Subjective:    Patient ID: Chase [name], male    DOB: [birth year], 78 y.o.   MRN: [id number]  HPI Pt in with wife and 2 daughters for f/u of recent Ed visit. He had 3 separate episodes of abnormal behavior 3 night ago, longest duration 10 minutes, abnormal jerking movements witnessed, which seemed meaningless, he also became agitated and combative. The family has this videotaped on camera and shows this at the [facility]. EMS was called , and on 2nd eval pt was sent to the ED. Presumptive new diagnosis of seizure disorder , and he is started on kepra. On review of meds, pt continues to be non compliant with thyroid med and has an expired box of samples, he is advised to stop tramadol with this new dx and is referred for brain scan and to neurology. Driving forbidden for now. Family advised to hold off on physical therapy for the next 4 weeks    Review of Systems See HPI Denies recent fever or chills. Denies sinus pressure, nasal congestion, ear pain or sore throat. Denies chest congestion, productive cough or wheezing. Denies chest pains, palpitations and leg swelling Denies abdominal pain, nausea, vomiting,diarrhea or constipation.   Denies dysuria, frequency, hesitancy or incontinence. Denies joint pain, swelling and limitation in mobility. Denies headaches, . Denies depression, anxiety or insomnia. Denies skin break down or rash.        Objective:   Physical Exam  Patient alert and oriented and in no cardiopulmonary distress.  HEENT: No facial asymmetry, EOMI, no sinus tenderness,  oropharynx pink and moist.  Neck supple no adenopathy.  Chest: Clear to auscultation bilaterally.  CVS: S1, S2 no murmurs, no S3.  ABD: Soft non tender. Bowel sounds normal.  Ext: No edema  MS: Adequate though reduced  ROM spine, shoulders, hips and knees.  Skin: Intact, no ulcerations or rash noted.  Psych: Good eye contact, normal affect. Memory intact not anxious or depressed  appearing.  CNS: CN 2-12 intact,reduced grip in both hands, decreased  sensation in upper and lower extremities      Assessment & Plan:

## 2012-03-07 NOTE — Assessment & Plan Note (Signed)
New problem needs neuro eval and MRI brain ordered

## 2012-03-07 NOTE — Assessment & Plan Note (Signed)
Uncontrolled, needs to resume losartan

## 2012-03-07 NOTE — Telephone Encounter (Signed)
Pt has appt today

## 2012-03-07 NOTE — Patient Instructions (Signed)
F/u end May or early June.  You are referred to neurology and for an MRI of your brain.   You cannot drive until the neurologist has evaluated you and said this is safe.  You need to take the cozaar for your blood pressure, this is high today and you don't seem to have been taking it.  You need to take thyroid medication every day, this has been sent to your pharmacy.  Stop tramadol  TSH will be added to labs from Ed  You need TSH in June before appt.  Ensure you bring medication to every visit, and have a family member with you whenever possible

## 2012-03-07 NOTE — Assessment & Plan Note (Signed)
Uncontrolled, medical non compliance, wife and 2 daughters aware of the situation and will remedy this

## 2012-03-08 ENCOUNTER — Telehealth: Payer: Self-pay | Admitting: Family Medicine

## 2012-03-08 LAB — TSH: TSH: 43.078 u[IU]/mL — ABNORMAL HIGH (ref 0.350–4.500)

## 2012-03-08 NOTE — Telephone Encounter (Signed)
I had asked to see if I can spk with dr Merlene Laughter to try to get sooner appt so tomorrow if you can't get sooner pls let me spk with him. I had already entered that in the referral note, I thought

## 2012-03-09 NOTE — Telephone Encounter (Signed)
Called and left message for dr. Merlene Laughter to call dr. Moshe Cipro when he returns back to town.

## 2012-03-14 ENCOUNTER — Other Ambulatory Visit: Payer: Self-pay

## 2012-03-14 ENCOUNTER — Encounter (HOSPITAL_COMMUNITY): Payer: Self-pay | Admitting: *Deleted

## 2012-03-14 ENCOUNTER — Emergency Department (HOSPITAL_COMMUNITY): Payer: Medicare Other

## 2012-03-14 ENCOUNTER — Ambulatory Visit (HOSPITAL_COMMUNITY): Admission: RE | Admit: 2012-03-14 | Payer: Medicare Other | Source: Ambulatory Visit

## 2012-03-14 ENCOUNTER — Inpatient Hospital Stay (HOSPITAL_COMMUNITY)
Admission: EM | Admit: 2012-03-14 | Discharge: 2012-03-15 | DRG: 065 | Disposition: A | Discharge status: Home / Self Care | Payer: Medicare Other | Attending: Internal Medicine | Admitting: Internal Medicine

## 2012-03-14 ENCOUNTER — Ambulatory Visit (HOSPITAL_COMMUNITY)
Admission: RE | Admit: 2012-03-14 | Discharge: 2012-03-14 | Disposition: A | Discharge status: Home / Self Care | Payer: Medicare Other | Source: Ambulatory Visit | Attending: Family Medicine | Admitting: Family Medicine

## 2012-03-14 DIAGNOSIS — E782 Mixed hyperlipidemia: Secondary | ICD-10-CM | POA: Diagnosis present

## 2012-03-14 DIAGNOSIS — E1129 Type 2 diabetes mellitus with other diabetic kidney complication: Secondary | ICD-10-CM | POA: Diagnosis present

## 2012-03-14 DIAGNOSIS — R269 Unspecified abnormalities of gait and mobility: Secondary | ICD-10-CM | POA: Diagnosis not present

## 2012-03-14 DIAGNOSIS — Z87442 Personal history of urinary calculi: Secondary | ICD-10-CM | POA: Diagnosis not present

## 2012-03-14 DIAGNOSIS — R4182 Altered mental status, unspecified: Secondary | ICD-10-CM | POA: Diagnosis not present

## 2012-03-14 DIAGNOSIS — F329 Major depressive disorder, single episode, unspecified: Secondary | ICD-10-CM | POA: Diagnosis present

## 2012-03-14 DIAGNOSIS — E1165 Type 2 diabetes mellitus with hyperglycemia: Secondary | ICD-10-CM | POA: Diagnosis present

## 2012-03-14 DIAGNOSIS — Z9181 History of falling: Secondary | ICD-10-CM

## 2012-03-14 DIAGNOSIS — M1710 Unilateral primary osteoarthritis, unspecified knee: Secondary | ICD-10-CM | POA: Diagnosis present

## 2012-03-14 DIAGNOSIS — F3289 Other specified depressive episodes: Secondary | ICD-10-CM | POA: Diagnosis present

## 2012-03-14 DIAGNOSIS — G40909 Epilepsy, unspecified, not intractable, without status epilepticus: Secondary | ICD-10-CM | POA: Diagnosis present

## 2012-03-14 DIAGNOSIS — G379 Demyelinating disease of central nervous system, unspecified: Secondary | ICD-10-CM | POA: Diagnosis present

## 2012-03-14 DIAGNOSIS — I6330 Cerebral infarction due to thrombosis of unspecified cerebral artery: Secondary | ICD-10-CM | POA: Diagnosis not present

## 2012-03-14 DIAGNOSIS — E1142 Type 2 diabetes mellitus with diabetic polyneuropathy: Secondary | ICD-10-CM | POA: Diagnosis not present

## 2012-03-14 DIAGNOSIS — Z880 Allergy status to penicillin: Secondary | ICD-10-CM

## 2012-03-14 DIAGNOSIS — E785 Hyperlipidemia, unspecified: Secondary | ICD-10-CM | POA: Diagnosis present

## 2012-03-14 DIAGNOSIS — E1059 Type 1 diabetes mellitus with other circulatory complications: Secondary | ICD-10-CM | POA: Diagnosis present

## 2012-03-14 DIAGNOSIS — I10 Essential (primary) hypertension: Secondary | ICD-10-CM | POA: Diagnosis present

## 2012-03-14 DIAGNOSIS — Z6831 Body mass index (BMI) 31.0-31.9, adult: Secondary | ICD-10-CM

## 2012-03-14 DIAGNOSIS — L97909 Non-pressure chronic ulcer of unspecified part of unspecified lower leg with unspecified severity: Secondary | ICD-10-CM | POA: Diagnosis present

## 2012-03-14 DIAGNOSIS — G909 Disorder of the autonomic nervous system, unspecified: Secondary | ICD-10-CM | POA: Diagnosis present

## 2012-03-14 DIAGNOSIS — M169 Osteoarthritis of hip, unspecified: Secondary | ICD-10-CM | POA: Diagnosis present

## 2012-03-14 DIAGNOSIS — Z91199 Patient's noncompliance with other medical treatment and regimen due to unspecified reason: Secondary | ICD-10-CM | POA: Diagnosis not present

## 2012-03-14 DIAGNOSIS — E669 Obesity, unspecified: Secondary | ICD-10-CM | POA: Diagnosis present

## 2012-03-14 DIAGNOSIS — Z01812 Encounter for preprocedural laboratory examination: Secondary | ICD-10-CM

## 2012-03-14 DIAGNOSIS — E1149 Type 2 diabetes mellitus with other diabetic neurological complication: Secondary | ICD-10-CM | POA: Diagnosis present

## 2012-03-14 DIAGNOSIS — N189 Chronic kidney disease, unspecified: Secondary | ICD-10-CM | POA: Diagnosis present

## 2012-03-14 DIAGNOSIS — I739 Peripheral vascular disease, unspecified: Secondary | ICD-10-CM | POA: Diagnosis present

## 2012-03-14 DIAGNOSIS — M1610 Unilateral primary osteoarthritis, unspecified hip: Secondary | ICD-10-CM | POA: Diagnosis present

## 2012-03-14 DIAGNOSIS — E039 Hypothyroidism, unspecified: Secondary | ICD-10-CM | POA: Diagnosis present

## 2012-03-14 DIAGNOSIS — I517 Cardiomegaly: Secondary | ICD-10-CM | POA: Diagnosis not present

## 2012-03-14 DIAGNOSIS — N058 Unspecified nephritic syndrome with other morphologic changes: Secondary | ICD-10-CM | POA: Diagnosis present

## 2012-03-14 DIAGNOSIS — I639 Cerebral infarction, unspecified: Secondary | ICD-10-CM | POA: Diagnosis present

## 2012-03-14 DIAGNOSIS — E1169 Type 2 diabetes mellitus with other specified complication: Secondary | ICD-10-CM | POA: Diagnosis present

## 2012-03-14 DIAGNOSIS — G959 Disease of spinal cord, unspecified: Secondary | ICD-10-CM | POA: Diagnosis present

## 2012-03-14 DIAGNOSIS — Z79899 Other long term (current) drug therapy: Secondary | ICD-10-CM

## 2012-03-14 DIAGNOSIS — R569 Unspecified convulsions: Secondary | ICD-10-CM

## 2012-03-14 DIAGNOSIS — I498 Other specified cardiac arrhythmias: Secondary | ICD-10-CM | POA: Diagnosis present

## 2012-03-14 DIAGNOSIS — I150 Renovascular hypertension: Secondary | ICD-10-CM | POA: Diagnosis present

## 2012-03-14 DIAGNOSIS — G40109 Localization-related (focal) (partial) symptomatic epilepsy and epileptic syndromes with simple partial seizures, not intractable, without status epilepticus: Secondary | ICD-10-CM | POA: Diagnosis not present

## 2012-03-14 DIAGNOSIS — Z9119 Patient's noncompliance with other medical treatment and regimen: Secondary | ICD-10-CM | POA: Diagnosis not present

## 2012-03-14 DIAGNOSIS — M4800 Spinal stenosis, site unspecified: Secondary | ICD-10-CM | POA: Diagnosis present

## 2012-03-14 DIAGNOSIS — E1143 Type 2 diabetes mellitus with diabetic autonomic (poly)neuropathy: Secondary | ICD-10-CM | POA: Diagnosis present

## 2012-03-14 DIAGNOSIS — R001 Bradycardia, unspecified: Secondary | ICD-10-CM | POA: Diagnosis present

## 2012-03-14 DIAGNOSIS — I6381 Other cerebral infarction due to occlusion or stenosis of small artery: Secondary | ICD-10-CM

## 2012-03-14 DIAGNOSIS — I6350 Cerebral infarction due to unspecified occlusion or stenosis of unspecified cerebral artery: Principal | ICD-10-CM | POA: Diagnosis present

## 2012-03-14 DIAGNOSIS — I6529 Occlusion and stenosis of unspecified carotid artery: Secondary | ICD-10-CM | POA: Diagnosis not present

## 2012-03-14 DIAGNOSIS — IMO0002 Reserved for concepts with insufficient information to code with codable children: Secondary | ICD-10-CM | POA: Diagnosis present

## 2012-03-14 DIAGNOSIS — E1121 Type 2 diabetes mellitus with diabetic nephropathy: Secondary | ICD-10-CM | POA: Diagnosis present

## 2012-03-14 DIAGNOSIS — J9819 Other pulmonary collapse: Secondary | ICD-10-CM | POA: Diagnosis not present

## 2012-03-14 DIAGNOSIS — E1122 Type 2 diabetes mellitus with diabetic chronic kidney disease: Secondary | ICD-10-CM | POA: Diagnosis present

## 2012-03-14 DIAGNOSIS — E119 Type 2 diabetes mellitus without complications: Secondary | ICD-10-CM | POA: Diagnosis not present

## 2012-03-14 HISTORY — DX: Type 2 diabetes mellitus with diabetic neuropathy, unspecified: E11.40

## 2012-03-14 HISTORY — DX: Other cerebral infarction due to occlusion or stenosis of small artery: I63.81

## 2012-03-14 HISTORY — DX: Complete lesion at unspecified level of cervical spinal cord, initial encounter: S14.119A

## 2012-03-14 HISTORY — DX: Bradycardia, unspecified: R00.1

## 2012-03-14 HISTORY — DX: Hypothyroidism, unspecified: E03.9

## 2012-03-14 HISTORY — DX: Unspecified convulsions: R56.9

## 2012-03-14 LAB — URINALYSIS, ROUTINE W REFLEX MICROSCOPIC
Bilirubin Urine: NEGATIVE
Glucose, UA: NEGATIVE mg/dL
Ketones, ur: NEGATIVE mg/dL
Leukocytes, UA: NEGATIVE
Nitrite: NEGATIVE
Protein, ur: 30 mg/dL — AB
Specific Gravity, Urine: 1.01 (ref 1.005–1.030)
Urobilinogen, UA: 0.2 mg/dL (ref 0.0–1.0)
pH: 6.5 (ref 5.0–8.0)

## 2012-03-14 LAB — GLUCOSE, CAPILLARY: Glucose-Capillary: 111 mg/dL — ABNORMAL HIGH (ref 70–99)

## 2012-03-14 LAB — DIFFERENTIAL
Basophils Absolute: 0 10*3/uL (ref 0.0–0.1)
Basophils Relative: 0 % (ref 0–1)
Eosinophils Absolute: 0.4 10*3/uL (ref 0.0–0.7)
Eosinophils Relative: 5 % (ref 0–5)
Lymphocytes Relative: 32 % (ref 12–46)
Lymphs Abs: 2.2 10*3/uL (ref 0.7–4.0)
Monocytes Absolute: 0.8 10*3/uL (ref 0.1–1.0)
Monocytes Relative: 11 % (ref 3–12)
Neutro Abs: 3.5 10*3/uL (ref 1.7–7.7)
Neutrophils Relative %: 51 % (ref 43–77)

## 2012-03-14 LAB — CBC
HCT: 37.3 % — ABNORMAL LOW (ref 39.0–52.0)
Hemoglobin: 12.3 g/dL — ABNORMAL LOW (ref 13.0–17.0)
MCH: 31.5 pg (ref 26.0–34.0)
MCHC: 33 g/dL (ref 30.0–36.0)
MCV: 95.6 fL (ref 78.0–100.0)
Platelets: 214 10*3/uL (ref 150–400)
RBC: 3.9 MIL/uL — ABNORMAL LOW (ref 4.22–5.81)
RDW: 13.6 % (ref 11.5–15.5)
WBC: 6.9 10*3/uL (ref 4.0–10.5)

## 2012-03-14 LAB — TROPONIN I: Troponin I: <0.3 ng/mL (ref <0.30)

## 2012-03-14 LAB — COMPREHENSIVE METABOLIC PANEL
ALT: 29 U/L (ref 0–53)
AST: 29 U/L (ref 0–37)
Albumin: 3.6 g/dL (ref 3.5–5.2)
Alkaline Phosphatase: 95 U/L (ref 39–117)
BUN: 33 mg/dL — ABNORMAL HIGH (ref 6–23)
CO2: 27 mEq/L (ref 19–32)
Calcium: 9.9 mg/dL (ref 8.4–10.5)
Chloride: 103 mEq/L (ref 96–112)
Creatinine, Ser: 2.53 mg/dL — ABNORMAL HIGH (ref 0.50–1.35)
GFR calc Af Amer: 26 mL/min — ABNORMAL LOW (ref >90)
GFR calc non Af Amer: 23 mL/min — ABNORMAL LOW (ref >90)
Glucose, Bld: 134 mg/dL — ABNORMAL HIGH (ref 70–99)
Potassium: 4.7 mEq/L (ref 3.5–5.1)
Sodium: 138 mEq/L (ref 135–145)
Total Bilirubin: 0.2 mg/dL — ABNORMAL LOW (ref 0.3–1.2)
Total Protein: 7.6 g/dL (ref 6.0–8.3)

## 2012-03-14 LAB — URINE MICROSCOPIC-ADD ON

## 2012-03-14 LAB — PROTIME-INR
INR: 0.91 (ref 0.00–1.49)
Prothrombin Time: 12.5 seconds (ref 11.6–15.2)

## 2012-03-14 LAB — APTT: aPTT: 28 seconds (ref 24–37)

## 2012-03-14 LAB — CK TOTAL AND CKMB (NOT AT ARMC)
CK, MB: 5.8 ng/mL — ABNORMAL HIGH (ref 0.3–4.0)
Relative Index: 3 — ABNORMAL HIGH (ref 0.0–2.5)
Total CK: 195 U/L (ref 7–232)

## 2012-03-14 MED ORDER — AMLODIPINE BESYLATE 5 MG PO TABS
10.0000 mg | ORAL_TABLET | Freq: Every day | ORAL | Status: DC
  Administered 2012-03-15: 10 mg via ORAL
  Filled 2012-03-14: qty 2

## 2012-03-14 MED ORDER — SODIUM CHLORIDE 0.9 % IV SOLN
INTRAVENOUS | Status: DC
  Administered 2012-03-14 – 2012-03-15 (×3): via INTRAVENOUS

## 2012-03-14 MED ORDER — ENOXAPARIN SODIUM 30 MG/0.3ML ~~LOC~~ SOLN
30.0000 mg | SUBCUTANEOUS | Status: DC
  Administered 2012-03-14: 30 mg via SUBCUTANEOUS
  Filled 2012-03-14: qty 0.3

## 2012-03-14 MED ORDER — INSULIN ASPART 100 UNIT/ML ~~LOC~~ SOLN
0.0000 [IU] | Freq: Three times a day (TID) | SUBCUTANEOUS | Status: DC
  Administered 2012-03-15: 1 [IU] via SUBCUTANEOUS
  Administered 2012-03-15: 7 [IU] via SUBCUTANEOUS

## 2012-03-14 MED ORDER — CLOPIDOGREL BISULFATE 75 MG PO TABS
75.0000 mg | ORAL_TABLET | Freq: Every day | ORAL | Status: DC
  Administered 2012-03-15: 75 mg via ORAL
  Filled 2012-03-14: qty 1

## 2012-03-14 MED ORDER — INSULIN ASPART 100 UNIT/ML ~~LOC~~ SOLN: 0.0000 [IU] | Freq: Every day | SUBCUTANEOUS | Status: DC

## 2012-03-14 MED ORDER — SODIUM CHLORIDE 0.9 % IV SOLN: INTRAVENOUS | Status: DC

## 2012-03-14 MED ORDER — SENNOSIDES-DOCUSATE SODIUM 8.6-50 MG PO TABS: 1.0000 | ORAL_TABLET | Freq: Every evening | ORAL | Status: DC | PRN

## 2012-03-14 MED ORDER — LOSARTAN POTASSIUM 50 MG PO TABS
100.0000 mg | ORAL_TABLET | Freq: Every day | ORAL | Status: DC
  Administered 2012-03-15: 100 mg via ORAL
  Filled 2012-03-14: qty 2

## 2012-03-14 MED ORDER — OXYBUTYNIN CHLORIDE 5 MG PO TABS
10.0000 mg | ORAL_TABLET | Freq: Two times a day (BID) | ORAL | Status: DC
  Administered 2012-03-14 – 2012-03-15 (×2): 10 mg via ORAL
  Filled 2012-03-14 (×2): qty 2

## 2012-03-14 MED ORDER — FLUOXETINE HCL 10 MG PO CAPS
10.0000 mg | ORAL_CAPSULE | Freq: Every day | ORAL | Status: DC
Start: 2012-03-14 — End: 2012-03-15
  Administered 2012-03-15: 10 mg via ORAL
  Filled 2012-03-14 (×3): qty 1

## 2012-03-14 MED ORDER — LEVOTHYROXINE SODIUM 150 MCG PO TABS
150.0000 ug | ORAL_TABLET | Freq: Every day | ORAL | Status: DC
  Administered 2012-03-15: 150 ug via ORAL
  Filled 2012-03-14 (×2): qty 1

## 2012-03-14 MED ORDER — SIMVASTATIN 20 MG PO TABS
20.0000 mg | ORAL_TABLET | Freq: Every day | ORAL | Status: DC
  Administered 2012-03-14: 20 mg via ORAL
  Filled 2012-03-14: qty 1

## 2012-03-14 MED ORDER — LEVETIRACETAM 500 MG PO TABS
500.0000 mg | ORAL_TABLET | Freq: Two times a day (BID) | ORAL | Status: DC
  Administered 2012-03-14 – 2012-03-15 (×2): 500 mg via ORAL
  Filled 2012-03-14 (×2): qty 1

## 2012-03-14 MED ORDER — ONDANSETRON HCL 4 MG/2ML IJ SOLN: 4.0000 mg | Freq: Four times a day (QID) | INTRAMUSCULAR | Status: DC | PRN

## 2012-03-14 MED ORDER — GLIPIZIDE 5 MG PO TABS
10.0000 mg | ORAL_TABLET | Freq: Two times a day (BID) | ORAL | Status: DC
  Administered 2012-03-15: 10 mg via ORAL
  Filled 2012-03-14: qty 2

## 2012-03-14 NOTE — H&P (Addendum)
PCP:   Tula Nakayama, MD, MD   Chief Complaint:  Stroke  HPI: Chase [name] is an 78 y.o. male.  Obese African American gentleman with multiple medical problems including  chronic kidney disease, diabetes, hypertension, and hyperlipidemia, was seen in the emergency room 10 days ago for recurrent episodes of  repetitive seizure-like movements over the previous 24 hours. This movement disorder was not associated with a loss of consciousness, but he had no memory of them after they ended. He was diagnosed in the emergency room with new onset of partial complex seizures, started on Keppra, and discharged.  At followup with his PCP he was noted to be noncompliant with medication, telephone video of his seizure activity was noted, and arrangements made for an outpatient MRI and follow up with Dr. Phillips Odor. He has an appointment with Dr. Merlene Laughter tomorrow, April 9, at 11:15 AM.  Patient completed his MRI examination today and since it showed an acute left parietal lobe acunar infarct, he was transported to the emergency room for evaluation. Patient denies any impairment of speech or swallowing, has chronic blurring of vision.In fact denies any problems at all, and is puzzled why he has to be getting these extensive evaluations.  The patient also has a chronic arthritis, history of recurrent falls, and is getting home physical therapy for this.  His blood sugars tend to run in the range of 180-200; very occasionally the fall to 130.  His baseline creatinine seems to be about 1.9 but it has been rising steadily since last October.   Patient reports that aspirin causes severe stomach, but he can take 81 mg ASA.  Rewiew of Systems:  The patient denies anorexia, fever, weight loss,, vision loss, decreased hearing, hoarseness, chest pain, syncope, dyspnea on exertion, balance deficits, hemoptysis, abdominal pain, melena, hematochezia, severe indigestion/heartburn, hematuria, incontinence, genital  sores, muscle weakness, suspicious skin lesions, transient blindness, unusual weight change, abnormal bleeding, enlarged lymph nodes, angioedema, and breast masses.   Past Medical History  Diagnosis Date  . Diabetes mellitus   . Thyroid disorder   . Hypertension   . Peripheral vascular disease, unspecified   . Unspecified hypothyroidism   . Depressive disorder, not elsewhere classified   . Obesity   . Other and unspecified hyperlipidemia   . Unspecified essential hypertension   . Pain in joint, lower leg   . Osteoarthrosis, unspecified whether generalized or localized, lower leg   . Derangement of meniscus, not elsewhere classified   . Lumbago   . Spondylosis of unspecified site without mention of myelopathy   . Spinal stenosis, unspecified region other than cervical   . Backache, unspecified   . Family history of diabetes mellitus   . Renal disorder   . Seizures     Past Surgical History  Procedure Date  . Kidney stones left x2 1975  . Kidney surgery     Medications:  HOME MEDS: Prior to Admission medications   Medication Sig Start Date End Date Taking? Authorizing Provider  amLODipine (NORVASC) 10 MG tablet Take 10 mg by mouth daily. BLOOD PRESSURE 02/04/12 02/03/13 Yes [name], MD  aspirin (ASPIRIN LOW DOSE) 81 MG EC tablet Take 81 mg by mouth daily. FOR HEART (BLOOD THINNER)   Yes Historical Provider, MD  BEE POLLEN PO Take 1 tablet by mouth daily.   Yes Historical Provider, MD  glipiZIDE (GLUCOTROL) 10 MG tablet Take 10 mg by mouth 2 (two) times daily. FOR BLOOD SUGAR 02/04/12 02/03/13 Yes [place] Levo  Moshe Cipro, MD  levETIRAcetam (KEPPRA) 500 MG tablet Take 500 mg by mouth 2 (two) times daily. FOR SEIZURE 03/05/12 03/05/13 Yes Hoy Morn, MD  Levothyroxine Sodium 150 MCG CAPS Take 150 mcg by mouth daily. FOR THYROID 03/07/12  Yes [name], MD  losartan (COZAAR) 100 MG tablet Take 100 mg by mouth daily. FOR HEART/BLOOD PRESSURE 03/07/12 03/07/13 Yes [name], MD  lovastatin (MEVACOR) 40 MG tablet Take 40 mg by mouth at bedtime. FOR CHOLESTEROL 03/24/11  Yes [name], MD  oxybutynin (DITROPAN) 5 MG tablet Take 10 mg by mouth 2 (two) times daily. FOR BLADDER   Yes Historical Provider, MD  FLUoxetine (PROZAC) 10 MG tablet Take 10 mg by mouth daily. FOR MOOD/DEPRESSION 03/07/12 06/05/12  [name], MD  furosemide (LASIX) 20 MG tablet  11/10/11   [name], MD  linagliptin (TRADJENTA) 5 MG TABS tablet Take 5 mg by mouth daily. FOR BLOOD SUGARS    Historical Provider, MD     Allergies:  Allergies  Allergen Reactions  . Bayer Advanced Aspirin (Aspirin) Nausea And Vomiting  . Penicillins     Social History:   reports that he has never smoked. He does not have any smokeless tobacco history on file. He reports that he does not drink alcohol or use illicit drugs.  Family History: Family History  Problem Relation Age of Onset  . Diabetes Mother   . Prostate cancer Father   . Diabetes Sister   . Diabetes Brother   . Diabetes Brother   . Hypertension Brother   . Hypertension Brother   . Hypertension Brother      Physical Exam: Filed Vitals:   03/14/12 1545 03/14/12 1629 03/14/12 1708 03/14/12 1929  BP: 176/78  163/78 152/75  Pulse: 61  59 57  Temp: 98.2 F (36.8 C) 98.2 F (36.8 C)  98.4 F (36.9 C)  TempSrc: Oral   Oral  Resp: 20  18 17   Height: 5\' 6"  (1.676 m)     Weight: 88.451 kg (195 lb)     SpO2: 100%  98% 97%   Blood pressure 152/75, pulse 57, temperature 98.4 F (36.9 C), temperature source Oral, resp. rate 17, height 5\' 6"  (1.676 m), weight 88.451 kg (195 lb), SpO2 97.00%.  GEN:  Pleasant elderly African American gentleman lying in bed in no acute distress; cooperative with exam; surrounded by wife and numerous other family members. PSYCH:  alert and oriented x4; does not appear anxious or depressed;  HEENT: Mucous membranes pink and anicteric; PERRLA; EOM intact;  or carotid bruit; no  JVD; Breasts:: Not examined CHEST WALL: No tenderness CHEST: Normal respiration, clear to auscultation bilaterally HEART: Regular rate and rhythm; no murmurs rubs or gallops BACK: ; no CVA tenderness ABDOMEN: Obese, soft non-tender; no masses, no organomegaly, normal abdominal bowel sounds; no pannus; Rectal Exam: Not done EXTREMITIES: Extensive moderate arthropathy of the hands, knees;  1+ edema bilaterally to just below knees; healed ulcer above the right medial malleolus. Genitalia: not examined PULSES: 2+ and symmetric SKIN: Normal hydration no rash or ulceration other than noted above CNS: Cranial nerves 2-12 grossly intact no focal lateralizing neurologic deficit   Labs & Imaging Results for orders placed during the hospital encounter of 03/14/12 (from the past 48 hour(s))  PROTIME-INR     Status: Normal   Collection Time   03/14/12  4:24 PM      Component Value Range Comment   Prothrombin Time 12.5  11.6 - 15.2 (seconds)    INR 0.91  0.00 - 1.49    APTT     Status: Normal   Collection Time   03/14/12  4:24 PM      Component Value Range Comment   aPTT 28  24 - 37 (seconds)   CBC     Status: Abnormal   Collection Time   03/14/12  4:24 PM      Component Value Range Comment   WBC 6.9  4.0 - 10.5 (K/uL)    RBC 3.90 (*) 4.22 - 5.81 (MIL/uL)    Hemoglobin 12.3 (*) 13.0 - 17.0 (g/dL)    HCT 37.3 (*) 39.0 - 52.0 (%)    MCV 95.6  78.0 - 100.0 (fL)    MCH 31.5  26.0 - 34.0 (pg)    MCHC 33.0  30.0 - 36.0 (g/dL)    RDW 13.6  11.5 - 15.5 (%)    Platelets 214  150 - 400 (K/uL)   DIFFERENTIAL     Status: Normal   Collection Time   03/14/12  4:24 PM      Component Value Range Comment   Neutrophils Relative 51  43 - 77 (%)    Neutro Abs 3.5  1.7 - 7.7 (K/uL)    Lymphocytes Relative 32  12 - 46 (%)    Lymphs Abs 2.2  0.7 - 4.0 (K/uL)    Monocytes Relative 11  3 - 12 (%)    Monocytes Absolute 0.8  0.1 - 1.0 (K/uL)    Eosinophils Relative 5  0 - 5 (%)    Eosinophils Absolute 0.4  0.0 -  0.7 (K/uL)    Basophils Relative 0  0 - 1 (%)    Basophils Absolute 0.0  0.0 - 0.1 (K/uL)   COMPREHENSIVE METABOLIC PANEL     Status: Abnormal   Collection Time   03/14/12  4:24 PM      Component Value Range Comment   Sodium 138  135 - 145 (mEq/L)    Potassium 4.7  3.5 - 5.1 (mEq/L)    Chloride 103  96 - 112 (mEq/L)    CO2 27  19 - 32 (mEq/L)    Glucose, Bld 134 (*) 70 - 99 (mg/dL)    BUN 33 (*) 6 - 23 (mg/dL)    Creatinine, Ser 2.53 (*) 0.50 - 1.35 (mg/dL)    Calcium 9.9  8.4 - 10.5 (mg/dL)    Total Protein 7.6  6.0 - 8.3 (g/dL)    Albumin 3.6  3.5 - 5.2 (g/dL)    AST 29  0 - 37 (U/L)    ALT 29  0 - 53 (U/L)    Alkaline Phosphatase 95  39 - 117 (U/L)    Total Bilirubin 0.2 (*) 0.3 - 1.2 (mg/dL)    GFR calc non Af Amer 23 (*) >90 (mL/min)    GFR calc Af Amer 26 (*) >90 (mL/min)   CK TOTAL AND CKMB     Status: Abnormal   Collection Time   03/14/12  4:24 PM      Component Value Range Comment   Total CK 195  7 - 232 (U/L)    CK, MB 5.8 (*) 0.3 - 4.0 (ng/mL)    Relative Index 3.0 (*) 0.0 - 2.5    TROPONIN I     Status: Normal   Collection Time   03/14/12  4:24 PM      Component Value Range Comment   Troponin I <0.30  <  0.30 (ng/mL)    Mr Brain Wo Contrast  03/14/2012  *RADIOLOGY REPORT*  Clinical Data: 78 year old male with seizure-like episode.  History of chronic gait disorder.  MRI HEAD WITHOUT CONTRAST  Technique:  Multiplanar, multiecho pulse sequences of the brain and surrounding structures were obtained according to standard protocol without intravenous contrast.  Comparison: Head CT 03/05/2012.  Brain MRI 12/11/2009.  Cervical MRIs 12/11/2009, 05/28/2005.  Findings: Punctate area of restricted diffusion in the left parietal lobe cortex or subcortical white matter (series 3 image 15, series 6 image 16).  No associated mass effect or hemorrhage. Diffusion elsewhere is within normal limits.  Chronic confluent T2 and FLAIR hyperintensity in the cerebral white matter, maximal in the  periatrial regions.  Chronic lacunar infarct near the genu of the right internal capsule at the caudate is new since 2011.  Patchy T2 hyperintensity in the pons is mildly increased since 2011.  Negative cerebellum.  Visualized upper cervical spine remarkable for a chronic cord lesion on the right at C1-C2, partially visible on today's T2 axial image (series 6 image 1).  This is grossly stable since the 2006 cervical exam.  Major intracranial vascular flow voids are stable.  Stable cerebral volume.  No ventriculomegaly. No midline shift, mass effect, or evidence of mass lesion.  No acute intracranial hemorrhage identified.  Negative pituitary. Visualized bone marrow signal is within normal limits.  Visualized orbit soft tissues are within normal limits.  Mild paranasal sinus mucosal thickening is stable.  Mastoids are clear. Negative scalp soft tissues.  IMPRESSION: 1.  Acute lacunar type infarct in the left parietal lobe cortex or subcortical white matter.  No mass effect or hemorrhage. 2.  Underlying chronic small vessel disease suspected with some progression since 2011 (right basal ganglia). 3.  Chronic C1-C2 spinal cord lesion appears grossly stable since 05/28/2005 cervical MRI.  Combined with the chronically advanced nonspecific cerebral white matter disease, this raises the possibility of chronic demyelination.  Study discussed by telephone with Dr. Tula Nakayama on 03/14/2012 at 1525 hours.  The patient will be transported from Radiology to the Emergency Department in light of these findings for continued evaluation and treatment.  Original Report Authenticated By: Randall An, M.D.   Dg Chest Port 1 View  03/14/2012  *RADIOLOGY REPORT*  Clinical Data: Abnormal MRI.  PORTABLE CHEST - 1 VIEW  Comparison: 09/01/2005  Findings: Cardiomegaly.  Bibasilar atelectasis present.  No effusions.  No acute bony abnormality.  IMPRESSION: Cardiomegaly, bibasilar atelectasis.  Original Report Authenticated By:  Raelyn Number, M.D.      Assessment Present on Admission:  .Stroke, causing complex partial seizures Chronic demyelinating disorder  .HYPOTHYROIDISM .HYPERLIPIDEMIA .OBESITY .DEPRESSION .HYPERTENSION .PERIPHERAL VASCULAR DISEASE .DEGENERATIVE JOINT DISEASE, KNEE .SPINAL STENOSIS .Peripheral  neuropathy due to diabetes mellitus .DM (diabetes mellitus) type II uncontrolled with renal manifestation Chronic kidney disease   PLAN: This gentleman is admitted for optimizing should have his stroke risk factors; including improved blood pressure and blood sugar control; will check fasting lipid panel; and will also check a 2-D echo and a carotid ultrasound. We'll start him on Plavix because of the history of severe aspirin intolerance  We'll consult neurology for evaluation in the morning the decision taken at nighttime with the patient's management can be continued as an outpatient.  Continue Keppra for the time being for his complex partial seizures.  His renal function seems to be deteriorate it may need to have his diuretic and ARB adjusted; discontinue Lasix for the time being  while we hydrate him but continue ARB and reevaluate in the morning  Other plans as per orders.   Enrica Corliss 03/14/2012, 8:16 PM

## 2012-03-14 NOTE — ED Provider Notes (Signed)
History  This chart was scribed for [name], MD by Jenne Campus. This patient was seen in room APA01/APA01 and the patient's care was started at 4:13PM.  CSN: [id number]  Arrival date & time 03/14/12  1538   First MD Initiated Contact with Patient 03/14/12 1551      Chief Complaint  Patient presents with  . Cerebrovascular Accident    Patient is a 78 y.o. male presenting with Acute Neurological Problem. The history is provided by the patient and the spouse. No language interpreter was used.  Cerebrovascular Accident This is a new problem. Episode onset: unknown. The problem occurs constantly. The problem has not changed since onset.Associated symptoms include headaches. Pertinent negatives include no chest pain, no abdominal pain and no shortness of breath. The symptoms are aggravated by nothing. The symptoms are relieved by nothing. He has tried nothing for the symptoms.    Chase [name] is a 78 y.o. male who presents to the Emergency Department from MRI complaining of a diagnosis of a stroke. Family states that they were not expecting anything to come from the MRI. PT believed that it was just part of a normal check up. Pt reports that he was evaluated for a seizure at this facility on 03/03/2012. Family reports that this was his first seizure. They deny any further episodes of seizure since then. Pt denies having difficulty walking or speaking. Pt reports an increased incidence of falls that he attributes to the neuropathy/numbness in both his legs. He also c/o chronic right-sided HAs but states that they are not related to the stroke.  He reports a change in eye sight about 2 months ago. He states that he was evaluated by an optometrist who reported no change in his eye sight 2 months ago. He denies chest pain, nausea, emesis, and diarrhea as associated symptoms. He reports that he has been taking all his medications as prescribed. Pt also has a h/o diabetes and HTN. He denies smoking  and alcohol use.  Dr. Moshe Cipro is PCP  Past Medical History  Diagnosis Date  . Diabetes mellitus   . Thyroid disorder   . Hypertension   . Peripheral vascular disease, unspecified   . Unspecified hypothyroidism   . Depressive disorder, not elsewhere classified   . Obesity   . Other and unspecified hyperlipidemia   . Unspecified essential hypertension   . Pain in joint, lower leg   . Osteoarthrosis, unspecified whether generalized or localized, lower leg   . Derangement of meniscus, not elsewhere classified   . Lumbago   . Spondylosis of unspecified site without mention of myelopathy   . Spinal stenosis, unspecified region other than cervical   . Backache, unspecified   . Family history of diabetes mellitus   . Renal disorder   . Seizures     Past Surgical History  Procedure Date  . Kidney stones left x2 1975  . Kidney surgery     Family History  Problem Relation Age of Onset  . Diabetes Mother   . Prostate cancer Father   . Diabetes Sister   . Diabetes Brother   . Diabetes Brother   . Hypertension Brother   . Hypertension Brother   . Hypertension Brother     History  Substance Use Topics  . Smoking status: Never Smoker   . Smokeless tobacco: Not on file  . Alcohol Use: No   Pt lives with his wife.   Review of Systems  Respiratory: Negative for cough and  shortness of breath.   Cardiovascular: Negative for chest pain.  Gastrointestinal: Negative for nausea, vomiting, abdominal pain and diarrhea.  Neurological: Positive for numbness and headaches. Negative for speech difficulty and weakness.  Psychiatric/Behavioral: Negative for confusion.  All other systems reviewed and are negative.    Allergies  Bayer advanced aspirin and Penicillins  Home Medications   Current Outpatient Rx  Name Route Sig Dispense Refill  . AMLODIPINE BESYLATE 10 MG PO TABS Oral Take 1 tablet (10 mg total) by mouth daily. 30 tablet 11    Dose increase effective 02/04/2012  .  ASPIRIN 81 MG PO TBEC Oral Take 81 mg by mouth daily.      Marland Kitchen FLUOXETINE HCL 10 MG PO TABS Oral Take 1 tablet (10 mg total) by mouth daily. 30 tablet 5  . FUROSEMIDE 20 MG PO TABS  TAKE ONE TABLET BY MOUTH EVERY DAY 30 tablet 3  . GLIPIZIDE 10 MG PO TABS Oral Take 1 tablet (10 mg total) by mouth 2 (two) times daily. 60 tablet 11  . LEVETIRACETAM 500 MG PO TABS Oral Take 1 tablet (500 mg total) by mouth 2 (two) times daily. 60 tablet 0  . LEVOTHYROXINE SODIUM 150 MCG PO CAPS Oral Take 1 capsule (150 mcg total) by mouth daily. 30 capsule 4    Dose reduction effective 03/07/2012  . LINAGLIPTIN 5 MG PO TABS Oral Take 5 mg by mouth daily.      Marland Kitchen LOSARTAN POTASSIUM 100 MG PO TABS Oral Take 1 tablet (100 mg total) by mouth daily. 30 tablet 4    Dose increase effective 07/13/2011. Pt to take TW ...  . LOVASTATIN 40 MG PO TABS Oral Take 1 tablet (40 mg total) by mouth at bedtime. Take  Two tablets by mouth at bedtime (dose change) 30 tablet 3  . OXYBUTYNIN CHLORIDE 5 MG PO TABS Oral Take 5 mg by mouth. Take two tablets by mouth three times a day       Triage Vitals: BP 176/78  Pulse 61  Temp(Src) 98.2 F (36.8 C) (Oral)  Resp 20  Ht 5\' 6"  (1.676 m)  Wt 195 lb (88.451 kg)  BMI 31.47 kg/m2  SpO2 100%  Vital signs normal except hypertension   Physical Exam  Nursing note and vitals reviewed. Constitutional: He is oriented to person, place, and time. He appears well-developed and well-nourished.  HENT:  Head: Normocephalic and atraumatic.  Right Ear: External ear normal.  Left Ear: External ear normal.  Nose: Nose normal.  Mouth/Throat: Oropharynx is clear and moist.  Eyes: Conjunctivae and EOM are normal. Pupils are equal, round, and reactive to light.  Neck: Normal range of motion. Neck supple.  Cardiovascular: Normal rate, regular rhythm and normal heart sounds.   Pulmonary/Chest: Effort normal. No respiratory distress. He has no wheezes. He has no rales. He exhibits no tenderness.    Abdominal: Soft. He exhibits no distension. There is no tenderness. There is no rebound and no guarding.  Musculoskeletal: Normal range of motion. Edema: Mild edema of lower legs.  Neurological: He is alert and oriented to person, place, and time. No cranial nerve deficit.       Cranial nerves 2 through 12 are intact, mild weakened bilateral grip strength but strength is equal bilaterally, no pronator drift, no focal weakness in legs or arms. No gross visual field cuts noted.   Skin: Skin is warm and dry.  Psychiatric: He has a normal mood and affect. His behavior is normal.  ED Course  Procedures (including critical care time)  Dr Moshe Cipro had called while patient was in MRI to alert ED pt coming to be admitted for acute stroke evaluation.  DIAGNOSTIC STUDIES: Oxygen Saturation is 100% on room air, normal by my interpretation.    COORDINATION OF CARE: 4:20PM-Discussed radiology report showing a new, small stroke and need for admission to the hospital with pt and family and both agreed to plan.  17:51 Dr Caryn Section admit to tele, team 1 to her   Results for orders placed during the hospital encounter of 03/14/12  PROTIME-INR      Component Value Range   Prothrombin Time 12.5  11.6 - 15.2 (seconds)   INR 0.91  0.00 - 1.49   APTT      Component Value Range   aPTT 28  24 - 37 (seconds)  CBC      Component Value Range   WBC 6.9  4.0 - 10.5 (K/uL)   RBC 3.90 (*) 4.22 - 5.81 (MIL/uL)   Hemoglobin 12.3 (*) 13.0 - 17.0 (g/dL)   HCT 37.3 (*) 39.0 - 52.0 (%)   MCV 95.6  78.0 - 100.0 (fL)   MCH 31.5  26.0 - 34.0 (pg)   MCHC 33.0  30.0 - 36.0 (g/dL)   RDW 13.6  11.5 - 15.5 (%)   Platelets 214  150 - 400 (K/uL)  DIFFERENTIAL      Component Value Range   Neutrophils Relative 51  43 - 77 (%)   Neutro Abs 3.5  1.7 - 7.7 (K/uL)   Lymphocytes Relative 32  12 - 46 (%)   Lymphs Abs 2.2  0.7 - 4.0 (K/uL)   Monocytes Relative 11  3 - 12 (%)   Monocytes Absolute 0.8  0.1 - 1.0 (K/uL)    Eosinophils Relative 5  0 - 5 (%)   Eosinophils Absolute 0.4  0.0 - 0.7 (K/uL)   Basophils Relative 0  0 - 1 (%)   Basophils Absolute 0.0  0.0 - 0.1 (K/uL)  COMPREHENSIVE METABOLIC PANEL      Component Value Range   Sodium 138  135 - 145 (mEq/L)   Potassium 4.7  3.5 - 5.1 (mEq/L)   Chloride 103  96 - 112 (mEq/L)   CO2 27  19 - 32 (mEq/L)   Glucose, Bld 134 (*) 70 - 99 (mg/dL)   BUN 33 (*) 6 - 23 (mg/dL)   Creatinine, Ser 2.53 (*) 0.50 - 1.35 (mg/dL)   Calcium 9.9  8.4 - 10.5 (mg/dL)   Total Protein 7.6  6.0 - 8.3 (g/dL)   Albumin 3.6  3.5 - 5.2 (g/dL)   AST 29  0 - 37 (U/L)   ALT 29  0 - 53 (U/L)   Alkaline Phosphatase 95  39 - 117 (U/L)   Total Bilirubin 0.2 (*) 0.3 - 1.2 (mg/dL)   GFR calc non Af Amer 23 (*) >90 (mL/min)   GFR calc Af Amer 26 (*) >90 (mL/min)  CK TOTAL AND CKMB      Component Value Range   Total CK 195  7 - 232 (U/L)   CK, MB 5.8 (*) 0.3 - 4.0 (ng/mL)   Relative Index 3.0 (*) 0.0 - 2.5   TROPONIN I      Component Value Range   Troponin I <0.30  <0.30 (ng/mL)   Laboratory interpretation all normal except elevated relative index with normal troponin, renal insufficiency, mild anemia   Ct Head Wo Contrast  03/05/2012  *RADIOLOGY  REPORT*  Clinical Data: Altered mental status.  Right-sided hand numbness and weakness.  Right leg numbness.  Falls.  Dizziness and weakness  CT HEAD WITHOUT CONTRAST  Technique:  Contiguous axial images were obtained from the base of the skull through the vertex without contrast.  Comparison: MRI brain 12/11/2009  Findings: Mild diffuse cerebral atrophy.  Low attenuation changes throughout the deep white matter consistent with small vessel ischemia.  Mild ventricular dilatation consistent with central atrophy.  Old lacunar infarcts in the basal ganglia.  No mass effect or midline shift.  No focal sulci effacement to suggest edema.  Gray-white matter junctions are distinct.  Basal cisterns are not effaced.  No evidence of acute intracranial  hemorrhage. Vascular calcifications.  Mucosal thickening in the maxillary antra.  Visualized mastoid air cells are not opacified.  No depressed skull fractures.  IMPRESSION: Chronic atrophy and small vessel ischemic changes.  No acute intracranial abnormalities identified.  Original Report Authenticated By: Neale Burly, M.D.   Mr Brain Wo Contrast  03/14/2012  *RADIOLOGY REPORT*  Clinical Data: 78 year old male with seizure-like episode.  History of chronic gait disorder.  MRI HEAD WITHOUT CONTRAST  Technique:  Multiplanar, multiecho pulse sequences of the brain and surrounding structures were obtained according to standard protocol without intravenous contrast.  Comparison: Head CT 03/05/2012.  Brain MRI 12/11/2009.  Cervical MRIs 12/11/2009, 05/28/2005.  Findings: Punctate area of restricted diffusion in the left parietal lobe cortex or subcortical white matter (series 3 image 15, series 6 image 16).  No associated mass effect or hemorrhage. Diffusion elsewhere is within normal limits.  Chronic confluent T2 and FLAIR hyperintensity in the cerebral white matter, maximal in the periatrial regions.  Chronic lacunar infarct near the genu of the right internal capsule at the caudate is new since 2011.  Patchy T2 hyperintensity in the pons is mildly increased since 2011.  Negative cerebellum.  Visualized upper cervical spine remarkable for a chronic cord lesion on the right at C1-C2, partially visible on today's T2 axial image (series 6 image 1).  This is grossly stable since the 2006 cervical exam.  Major intracranial vascular flow voids are stable.  Stable cerebral volume.  No ventriculomegaly. No midline shift, mass effect, or evidence of mass lesion.  No acute intracranial hemorrhage identified.  Negative pituitary. Visualized bone marrow signal is within normal limits.  Visualized orbit soft tissues are within normal limits.  Mild paranasal sinus mucosal thickening is stable.  Mastoids are clear. Negative  scalp soft tissues.  IMPRESSION: 1.  Acute lacunar type infarct in the left parietal lobe cortex or subcortical white matter.  No mass effect or hemorrhage. 2.  Underlying chronic small vessel disease suspected with some progression since 2011 (right basal ganglia). 3.  Chronic C1-C2 spinal cord lesion appears grossly stable since 05/28/2005 cervical MRI.  Combined with the chronically advanced nonspecific cerebral white matter disease, this raises the possibility of chronic demyelination.  Study discussed by telephone with Dr. Tula Nakayama on 03/14/2012 at 1525 hours.  The patient will be transported from Radiology to the Emergency Department in light of these findings for continued evaluation and treatment.  Original Report Authenticated By: Randall An, M.D.   Dg Chest Port 1 View  03/14/2012  *RADIOLOGY REPORT*  Clinical Data: Abnormal MRI.  PORTABLE CHEST - 1 VIEW  Comparison: 09/01/2005  Findings: Cardiomegaly.  Bibasilar atelectasis present.  No effusions.  No acute bony abnormality.  IMPRESSION: Cardiomegaly, bibasilar atelectasis.  Original Report Authenticated By: Raelyn Number,  M.D.    Date: 03/14/2012  Rate: 60  Rhythm: normal sinus rhythm  QRS Axis: left  Intervals: normal  ST/T Wave abnormalities: nonspecific T wave changes  Conduction Disutrbances:LVH, nonspecific widening of QRS  Narrative Interpretation:   Old EKG Reviewed: unchanged from 3/30  Final diagnoses:  Stroke    Plan admission  Rolland Porter, MD, FACEP     MDM     I personally performed the services described in this documentation, which was scribed in my presence. The recorded information has been reviewed and considered. Rolland Porter, MD, FACEP       [name], MD 03/14/12 [phone]

## 2012-03-14 NOTE — ED Notes (Signed)
Has not voided for urinalysis or urine drug screen.  Floor nurse informed.   Monitor NSR to Sinus Loletha Grayer without ectopy noted.

## 2012-03-14 NOTE — ED Notes (Signed)
Attempted to give report- floor will not take report at this time. Report given to [facility] RN in ER

## 2012-03-14 NOTE — ED Notes (Signed)
Pt brought to triage from MRI with dx of stroke.  Pt nor family know of any recent change.  Pt alert,

## 2012-03-15 ENCOUNTER — Inpatient Hospital Stay (HOSPITAL_COMMUNITY): Payer: Medicare Other

## 2012-03-15 ENCOUNTER — Encounter (HOSPITAL_COMMUNITY): Payer: Self-pay | Admitting: Internal Medicine

## 2012-03-15 ENCOUNTER — Other Ambulatory Visit (HOSPITAL_COMMUNITY): Payer: Medicare Other

## 2012-03-15 DIAGNOSIS — I517 Cardiomegaly: Secondary | ICD-10-CM

## 2012-03-15 DIAGNOSIS — I10 Essential (primary) hypertension: Secondary | ICD-10-CM | POA: Diagnosis present

## 2012-03-15 DIAGNOSIS — R001 Bradycardia, unspecified: Secondary | ICD-10-CM

## 2012-03-15 HISTORY — DX: Bradycardia, unspecified: R00.1

## 2012-03-15 LAB — CBC
HCT: 36 % — ABNORMAL LOW (ref 39.0–52.0)
Hemoglobin: 11.9 g/dL — ABNORMAL LOW (ref 13.0–17.0)
MCH: 31.4 pg (ref 26.0–34.0)
MCHC: 33.1 g/dL (ref 30.0–36.0)
MCV: 95 fL (ref 78.0–100.0)
Platelets: 223 10*3/uL (ref 150–400)
RBC: 3.79 MIL/uL — ABNORMAL LOW (ref 4.22–5.81)
RDW: 13.6 % (ref 11.5–15.5)
WBC: 5.7 10*3/uL (ref 4.0–10.5)

## 2012-03-15 LAB — COMPREHENSIVE METABOLIC PANEL
ALT: 23 U/L (ref 0–53)
AST: 25 U/L (ref 0–37)
Albumin: 3.1 g/dL — ABNORMAL LOW (ref 3.5–5.2)
Alkaline Phosphatase: 76 U/L (ref 39–117)
BUN: 30 mg/dL — ABNORMAL HIGH (ref 6–23)
CO2: 26 mEq/L (ref 19–32)
Calcium: 9.2 mg/dL (ref 8.4–10.5)
Chloride: 103 mEq/L (ref 96–112)
Creatinine, Ser: 2.2 mg/dL — ABNORMAL HIGH (ref 0.50–1.35)
GFR calc Af Amer: 31 mL/min — ABNORMAL LOW (ref >90)
GFR calc non Af Amer: 27 mL/min — ABNORMAL LOW (ref >90)
Glucose, Bld: 175 mg/dL — ABNORMAL HIGH (ref 70–99)
Potassium: 4.2 mEq/L (ref 3.5–5.1)
Sodium: 136 mEq/L (ref 135–145)
Total Bilirubin: 0.3 mg/dL (ref 0.3–1.2)
Total Protein: 6.6 g/dL (ref 6.0–8.3)

## 2012-03-15 LAB — LIPID PANEL
Cholesterol: 223 mg/dL — ABNORMAL HIGH (ref 0–200)
HDL: 88 mg/dL (ref >39)
LDL Cholesterol: 122 mg/dL — ABNORMAL HIGH (ref 0–99)
Total CHOL/HDL Ratio: 2.5 RATIO
Triglycerides: 67 mg/dL (ref <150)
VLDL: 13 mg/dL (ref 0–40)

## 2012-03-15 LAB — GLUCOSE, CAPILLARY
Glucose-Capillary: 126 mg/dL — ABNORMAL HIGH (ref 70–99)
Glucose-Capillary: 308 mg/dL — ABNORMAL HIGH (ref 70–99)

## 2012-03-15 LAB — VITAMIN B12: Vitamin B-12: 492 pg/mL (ref 211–911)

## 2012-03-15 LAB — HEMOGLOBIN A1C
Hgb A1c MFr Bld: 8.4 % — ABNORMAL HIGH (ref <5.7)
Mean Plasma Glucose: 194 mg/dL — ABNORMAL HIGH (ref <117)

## 2012-03-15 LAB — RPR: RPR Ser Ql: NONREACTIVE

## 2012-03-15 LAB — HOMOCYSTEINE: Homocysteine: 18.2 umol/L — ABNORMAL HIGH (ref 4.0–15.4)

## 2012-03-15 MED ORDER — BIOTENE DRY MOUTH MT LIQD
15.0000 mL | Freq: Two times a day (BID) | OROMUCOSAL | Status: DC
  Administered 2012-03-15: 15 mL via OROMUCOSAL

## 2012-03-15 MED ORDER — PNEUMOCOCCAL VAC POLYVALENT 25 MCG/0.5ML IJ INJ
0.5000 mL | INJECTION | INTRAMUSCULAR | Status: DC
  Filled 2012-03-15: qty 0.5

## 2012-03-15 NOTE — Evaluation (Signed)
Physical Therapy Evaluation Patient Details Name: Chase [name] MRN: [id number] DOB: 06-03-1931 Today's Date: 03/15/2012  Problem List:  Patient Active Problem List  Diagnoses  . HYPOTHYROIDISM  . DIABETIC ULCER, LEFT LEG  . HYPERLIPIDEMIA  . OBESITY  . DEPRESSION  . HYPERTENSION  . PERIPHERAL VASCULAR DISEASE  . DEGENERATIVE JOINT DISEASE, KNEE  . DERANGEMENT MENISCUS  . KNEE PAIN  . SPONDYLOSIS  . SPINAL STENOSIS  . LOW BACK PAIN  . BACK PAIN  . FATIGUE  . UNSTEADY GAIT  . NUMBNESS  . DM (diabetes mellitus) type II uncontrolled with renal manifestation  . Falls frequently  . Right hand weakness  . Seizures  . Stroke  . Peripheral autonomic neuropathy due to diabetes mellitus  . HTN (hypertension)  . Bradycardia    Past Medical History:  Past Medical History  Diagnosis Date  . Diabetes mellitus   . Hypothyroidism   . Hypertension   . Peripheral vascular disease, unspecified   . Unspecified hypothyroidism   . Depressive disorder, not elsewhere classified   . Obesity   . Other and unspecified hyperlipidemia   . Unspecified essential hypertension   . Pain in joint, lower leg   . Osteoarthrosis, unspecified whether generalized or localized, lower leg   . Derangement of meniscus, not elsewhere classified   . Lumbago   . Spondylosis of unspecified site without mention of myelopathy   . Spinal stenosis, unspecified region other than cervical   . Backache, unspecified   . Family history of diabetes mellitus   . CKD (chronic kidney disease) stage 3, GFR 30-59 ml/min   . Seizures   . Complete lesion of cervical spinal cord 03/14/3012    Stable since 2006  . Lacunar stroke, acute 03/14/2012  . Bradycardia 03/15/2012   Past Surgical History:  Past Surgical History  Procedure Date  . Kidney stones left x2 1975  . Kidney surgery     PT Assessment/Plan/Recommendation PT Assessment Clinical Impression Statement: Pt is very pleasant and cooperative gentleman whose  recent stroke has apparently left no new deficits.  He had been going to outpatient PT up until about 2 weeks ago and had been working on strength, balance and gait.  He has a L knee flexion contracture as well as mild hip flexion contractures/  L heel cord tightness..  His gait is unstable with no assistive device ( he generally only uses a walker for outings), and I strongly recommend that he use his walker at all times.  It will be up to him as to whether he would like to resume outpatient PT at d/c.  There are no new DME needs.                 PT Recommendation/Assessment: All further PT needs can be met in the next venue of care PT Problem List: Decreased balance;Impaired sensation (gait impairment) PT Therapy Diagnosis : Abnormality of gait PT Recommendation Follow Up Recommendations: Outpatient PT Equipment Recommended: None recommended by PT PT Goals     PT Evaluation Precautions/Restrictions  Precautions Precautions: Fall Required Braces or Orthoses: No Restrictions Weight Bearing Restrictions: No Prior Functioning  Home Living Lives With: Spouse Receives Help From: Family Type of Home: House Home Layout: One level Home Access: Stairs to enter Entrance Stairs-Rails: Psychiatric nurse of Steps: 4 Bathroom Shower/Tub:  (takes sponge baths) Bathroom Toilet: Standard Bathroom Accessibility: Yes How Accessible: Accessible via walker Home Adaptive Equipment: Walker - four wheeled;Walker - rolling;Straight cane Prior Function Level of  Independence: Independent with basic ADLs;Independent with gait;Independent with transfers Driving: Yes (MD was notified about this) Vocation: Retired Public librarian Arousal/Alertness: Awake/alert Overall Cognitive Status: Appears within functional limits for tasks assessed Orientation Level: Oriented X4 Sensation/Coordination Sensation Light Touch: Impaired by gross assessment (peripheral neuropathy both  hands) Stereognosis: Not tested Hot/Cold: Not tested Proprioception: Appears Intact Coordination Gross Motor Movements are Fluid and Coordinated: Yes Fine Motor Movements are Fluid and Coordinated: No Finger Nose Finger Test: slightly impaired with RUE Heel Shin Test: WNL Extremity Assessment RUE Assessment RUE Assessment: Within Functional Limits LUE Assessment LUE Assessment: Within Functional Limits RLE Assessment RLE Assessment: Within Functional Limits LLE Assessment LLE Assessment: Exceptions to WFL LLE PROM (degrees) Left Hip Extension 0-30: -20 Left Knee Extension 0-130: -22 Left Ankle Dorsiflexion 0-20: -15  LLE Strength LLE Overall Strength Comments: strength generally WNL Mobility (including Balance) Bed Mobility Bed Mobility: Yes Supine to Sit: 7: Independent Sit to Supine: 7: Independent Transfers Transfers: Yes Sit to Stand: 6: Modified independent (Device/Increase time) Stand to Sit: 6: Modified independent (Device/Increase time) Ambulation/Gait Ambulation/Gait: Yes Ambulation/Gait Assistance: 6: Modified independent (Device/Increase time) Ambulation Distance (Feet): 150 Feet Assistive device: Rolling walker Gait Pattern: Step-through pattern;Decreased step length - left;Decreased hip/knee flexion - right;Decreased hip/knee flexion - left;Decreased dorsiflexion - left;Left flexed knee in stance;Trunk flexed Gait velocity: rapid pace when ambulating without assistive device Stairs: No Wheelchair Mobility Wheelchair Mobility: No  Posture/Postural Control Posture/Postural Control: No significant limitations Balance Balance Assessed: Yes Static Sitting Balance Static Sitting - Level of Assistance: 7: Independent Dynamic Sitting Balance Dynamic Sitting - Level of Assistance: 7: Independent Static Standing Balance Static Standing - Level of Assistance: 5: Stand by assistance (stands with wide base of support) Dynamic Standing Balance Dynamic Standing -  Level of Assistance: 4: Min assist Exercise    End of Session PT - End of Session Equipment Utilized During Treatment: Gait belt Activity Tolerance: Patient tolerated treatment well Patient left: in bed;with call bell in reach;with family/visitor present Nurse Communication: Mobility status for transfers;Mobility status for ambulation General Behavior During Session: [facility] for tasks performed Cognition: [facility] for tasks performed  Sable Feil 03/15/2012, 3:36 PM

## 2012-03-15 NOTE — Progress Notes (Signed)
*  PRELIMINARY RESULTS* Echocardiogram 2D Echocardiogram has been performed.  [name] 03/15/2012, 11:28 AM

## 2012-03-15 NOTE — Progress Notes (Signed)
UR Chart Review Completed  

## 2012-03-15 NOTE — Consult Note (Signed)
NAME:  SHAMIK, CHOWDHRY NO.:  [id number]  MEDICAL RECORD NO.:  [id number]  LOCATION:  A316                          FACILITY:  [facility]  PHYSICIAN:  Ineze Serrao A. Merlene Laughter, M.D. DATE OF BIRTH:  18-Jul-1931  DATE OF CONSULTATION: DATE OF DISCHARGE:                                CONSULTATION   HISTORY OF PRESENT ILLNESS:  This is a 78 year old black male who is known to my service outpatient setting for gait impairment, was thought to be mostly due to severe osteoarthritic changes and diabetic polyneuropathy.  The patient has had a lot of issues with noncompliance, and we have made arrangements for the patient to receive medical management through all the pharmacists. This was done on the last appointment he was seen in January.  It is unclear if this was done, however.  It appears that the patient had couple of spells that were thought to be due to seizures. I did discuss with the spouse and wife and the patient became unresponsive and had automatisms with him picking at his hands and fumbling with different objects.  The event lasted for about 10 minutes.  He had 3 spells with a single night and was eventually taken to the emergency room where he was placed on Keppra. He has not had any of these spells since then.  This was about a week ago.  It appears that as part of this workup the patient's primary care provider, Dr. Moshe Cipro ordered the MRI as part of the workup.  MRI shows an acute left frontal lobe lacunar infarct, and he was subsequently sent to the emergency room for further evaluation.  The patient does not report any new complaints.  He has no recollection of the events of automatism and altered mentation, suggestive of complex partial seizures.  He continues to have significant multiple joint pain and recurrent falls.  I did discuss with the family and the wife history of noncompliance, and she eventually admitted that the patient has not been compliant with his  medications.  The patient himself also reports that sometimes he does not take his a.m. medications.  It appears that he has been more compliant with his meds since he has had 3 spells of altered mental status/altered mentation suggestive of seizures.  It appears that since then his children have more involved with his care.  It appears that his TSH has increased gradually since November 2012. It was actually in the normal range at that time, but increased to 29 about a month ago and subsequently is now 62.  Again, he had been on levothyroxine 200 mcg.  At least, he was supposed to be taking this medication given the increasing dose of TSH; however, this adds to the whole notion that the patient has not been compliant with his medications.  PHYSICAL EXAMINATION:  GENERAL:  A pleasant man in no acute distress. HEENT:  Neck is supple.  Head is normocephalic, atraumatic. ABDOMEN:  Soft. EXTREMITIES:  Scaling of the distal legs bilaterally. There is also browning of the skin and mild edema of the ankles.  He has some marked osteoarthritic changes of the hands, knees, and ankles. MENTATION:  He is  awake and alert.  He is oriented to person, place, year, month, and date.  He is currently lucid and coherent.  Speech, language, and cognition are intact.  Cranial nerve evaluation shows the pupils are equal, round, reactive to light and accommodation. Extraocular movements are full.  Facial muscle strength is symmetric. Tongue is midline.  Uvula midline.  Shoulder shrugs normal.  Motor examination shows normal tone, bulk, and strength throughout.  There is no pronator drift.  Coordination shows normal finger-to-nose.  There are no tremors.  No parkinsonism.  Reflexes are diminished in the legs. Plantars are both downgoing.  Sensation is normal to light touch.  IMPRESSION: 1. Spells of altered mental status likely due to recurrent seizures.     I agree that these are most likely complex partial  seizures. 2. Lacunar type infarct, this likely does not represent an aspirin's     failure given the patient's long history of noncompliance. 3. Noncompliance. 4. Gait impairment due to severe osteoarthritis and diabetic     polyneuropathy.  RECOMMENDATIONS:  The aspirin has been increased.  I think it is reasonable.  I also agree with the current workup including echo and carotid duplex Doppler.  Again, I did discuss the history of compliance with the wife and the patient.  It seems as if the children are getting involved, which hopefully will help to address more adequacy in the issue of compliance with his medication.  Given that he has had these complex partial seizures, we probably should discontinue his Ultram, which he was being prescribed to help with osteoarthritic pain.  Again for now, we will continue with the [place].  At some point in time, he will need to have an EEG.  This can be arranged on outpatient setting.     Henok Heacock A. Merlene Laughter, M.D.     KAD/MEDQ  D:  03/15/2012  T:  03/15/2012  Job:  [id number]

## 2012-03-15 NOTE — Progress Notes (Signed)
Thank you to Dr Caryn Section and Claretha Cooper RN CM for this referral.  Chart Review Complete.  Contacted patient and wife in his room via phone.  Service overview provided. He is clinically appropriate for services however, patient feels that he and his wife can manage at home independently.  He is willing to receive a transition of care call upon discharge to assure that he is stable at home.  I also recommend that the CM consider contacting [employer] to explore telephonic chronic disease management options for him. Their coordinator for [place] is Mrs. Baker [id number].  For any additional questions or new referrals please contact Corliss Blacker BSN RN [facility] Liaison at [phone].

## 2012-03-15 NOTE — Plan of Care (Signed)
Problem: Phase I Progression Outcomes Goal: Strict NPO til swallow screen done Outcome: Completed/Met Date Met:  03/15/12 Passed Swallow screen

## 2012-03-15 NOTE — Discharge Summary (Signed)
Physician Discharge Summary  Chase [name] MRN: [id number] DOB/AGE: 12/31/1930 78 y.o.  PCP: Tula Nakayama, MD, MD   Admit date: 03/14/2012 Discharge date: 03/15/2012  Discharge Diagnoses:  1. Acute left parietal lobe lacunar type stroke with no new deficits. The results of the 2-D echocardiogram are pending at the time of discharge. 2. Chronic C1-C2 spinal cord lesion grossly stable since June 22nd 2006 cervical MRI. 3. Seizure disorder, recently diagnosed. 4. Peripheral autonomic neuropathy due to diabetes mellitus. 5. 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 6. Osteoarthritis/degenerative joint disease with mild left knee flexion contracture and mild hip flexion contractures. 7. Type 2 diabetes mellitus. His hemoglobin A1c was 8.4. 8. Severe hypothyroidism. Recent outpatient TSH was 43. 9. Hypertension. 10. Chronic kidney disease secondary to diabetic nephropathy. 11. Hyperlipidemia. The patient's fasting lipid profile revealed a total cholesterol of 223, triglycerides of 67, HDL cholesterol 88, and LDL cholesterol of 122. 12. Normocytic anemia. 13. Asymptomatic bradycardia, likely secondary to severe hypothyroidism. 14. Left leg diabetic ulcer. 15. NONCOMPLIANCE.    Medication List  As of 03/15/2012  3:40 PM   TAKE these medications         amLODipine 10 MG tablet   Commonly known as: NORVASC   Take 10 mg by mouth daily. BLOOD PRESSURE      ASPIRIN LOW DOSE 81 MG EC tablet   Generic drug: aspirin   Take 81 mg by mouth daily. FOR HEART (BLOOD THINNER)      BEE POLLEN PO   Take 1 tablet by mouth daily.      FLUoxetine 10 MG tablet   Commonly known as: PROZAC   Take 10 mg by mouth daily. FOR MOOD/DEPRESSION      furosemide 20 MG tablet   Commonly known as: LASIX      glipiZIDE 10 MG tablet   Commonly known as: GLUCOTROL   Take 10 mg by mouth 2 (two) times daily. FOR BLOOD SUGAR      levETIRAcetam 500 MG tablet   Commonly known  as: KEPPRA   Take 500 mg by mouth 2 (two) times daily. FOR SEIZURE      Levothyroxine Sodium 150 MCG Caps   Take 150 mcg by mouth daily. FOR THYROID      losartan 100 MG tablet   Commonly known as: COZAAR   Take 100 mg by mouth daily. FOR HEART/BLOOD PRESSURE      lovastatin 40 MG tablet   Commonly known as: MEVACOR   Take 40 mg by mouth at bedtime. FOR CHOLESTEROL      oxybutynin 5 MG tablet   Commonly known as: DITROPAN   Take 10 mg by mouth 2 (two) times daily. FOR BLADDER      TRADJENTA 5 MG Tabs tablet   Generic drug: linagliptin   Take 5 mg by mouth daily. FOR BLOOD SUGARS            Discharge Condition: Stable.  Disposition: 01-Home or Self Care   Consults: Phillips Odor, MD   Significant Diagnostic Studies: Ct Head Wo Contrast  03/05/2012  *RADIOLOGY REPORT*  Clinical Data: Altered mental status.  Right-sided hand numbness and weakness.  Right leg numbness.  Falls.  Dizziness and weakness  CT HEAD WITHOUT CONTRAST  Technique:  Contiguous axial images were obtained from the base of the skull through the vertex without contrast.  Comparison: MRI brain 12/11/2009  Findings: Mild diffuse cerebral atrophy.  Low attenuation changes throughout the deep white matter consistent  with small vessel ischemia.  Mild ventricular dilatation consistent with central atrophy.  Old lacunar infarcts in the basal ganglia.  No mass effect or midline shift.  No focal sulci effacement to suggest edema.  Gray-white matter junctions are distinct.  Basal cisterns are not effaced.  No evidence of acute intracranial hemorrhage. Vascular calcifications.  Mucosal thickening in the maxillary antra.  Visualized mastoid air cells are not opacified.  No depressed skull fractures.  IMPRESSION: Chronic atrophy and small vessel ischemic changes.  No acute intracranial abnormalities identified.  Original Report Authenticated By: Neale Burly, M.D.   Mr Brain Wo Contrast  03/14/2012  *RADIOLOGY REPORT*   Clinical Data: 78 year old male with seizure-like episode.  History of chronic gait disorder.  MRI HEAD WITHOUT CONTRAST  Technique:  Multiplanar, multiecho pulse sequences of the brain and surrounding structures were obtained according to standard protocol without intravenous contrast.  Comparison: Head CT 03/05/2012.  Brain MRI 12/11/2009.  Cervical MRIs 12/11/2009, 05/28/2005.  Findings: Punctate area of restricted diffusion in the left parietal lobe cortex or subcortical white matter (series 3 image 15, series 6 image 16).  No associated mass effect or hemorrhage. Diffusion elsewhere is within normal limits.  Chronic confluent T2 and FLAIR hyperintensity in the cerebral white matter, maximal in the periatrial regions.  Chronic lacunar infarct near the genu of the right internal capsule at the caudate is new since 2011.  Patchy T2 hyperintensity in the pons is mildly increased since 2011.  Negative cerebellum.  Visualized upper cervical spine remarkable for a chronic cord lesion on the right at C1-C2, partially visible on today's T2 axial image (series 6 image 1).  This is grossly stable since the 2006 cervical exam.  Major intracranial vascular flow voids are stable.  Stable cerebral volume.  No ventriculomegaly. No midline shift, mass effect, or evidence of mass lesion.  No acute intracranial hemorrhage identified.  Negative pituitary. Visualized bone marrow signal is within normal limits.  Visualized orbit soft tissues are within normal limits.  Mild paranasal sinus mucosal thickening is stable.  Mastoids are clear. Negative scalp soft tissues.  IMPRESSION: 1.  Acute lacunar type infarct in the left parietal lobe cortex or subcortical white matter.  No mass effect or hemorrhage. 2.  Underlying chronic small vessel disease suspected with some progression since 2011 (right basal ganglia). 3.  Chronic C1-C2 spinal cord lesion appears grossly stable since 05/28/2005 cervical MRI.  Combined with the chronically  advanced nonspecific cerebral white matter disease, this raises the possibility of chronic demyelination.  Study discussed by telephone with Dr. Tula Nakayama on 03/14/2012 at 1525 hours.  The patient will be transported from Radiology to the Emergency Department in light of these findings for continued evaluation and treatment.  Original Report Authenticated By: Randall An, M.D.   US Carotid Duplex Bilateral  03/15/2012  *RADIOLOGY REPORT*  Clinical Data: Stroke. Hypertension, diabetes, tobacco use.  BILATERAL CAROTID DUPLEX ULTRASOUND  Technique: Pearline Cables scale imaging, color Doppler and duplex ultrasound was performed of bilateral carotid and vertebral arteries in the neck.  Comparison: None  Criteria:  Quantification of carotid stenosis is based on velocity parameters that correlate the residual internal carotid diameter with NASCET-based stenosis levels, using the diameter of the distal internal carotid lumen as the denominator for stenosis measurement.  The following velocity measurements were obtained:                   PEAK SYSTOLIC/END DIASTOLIC RIGHT ICA:  68/19cm/sec CCA:                        [id number] SYSTOLIC ICA/CCA RATIO:     [id number] DIASTOLIC ICA/CCA RATIO:    1.67 ECA:                        75cm/sec  LEFT ICA:                        87/27cm/sec CCA:                        [id number] SYSTOLIC ICA/CCA RATIO:     [id number] DIASTOLIC ICA/CCA RATIO:    2.07 ECA:                        97cm/sec  Findings:  RIGHT CAROTID ARTERY: There is intimal thickening through the common carotid artery.  There is smooth noncalcified plaque in the bulb without high-grade stenosis.  This extends into the proximal ICA.  Normal wave forms and color Doppler signal.  RIGHT VERTEBRAL ARTERY:  Normal flow direction and waveform.  LEFT CAROTID ARTERY: Intimal thickening through the common carotid artery.  There is mild asymmetric noncalcified plaque in the carotid bulb into the proximal ICA, without  high-grade stenosis. Normal wave forms and color Doppler signal.  LEFT VERTEBRAL ARTERY:  Normal flow direction and waveform.  IMPRESSION:  Early bilateral carotid bifurcation and proximal ICA plaque, resulting in less than 50% diameter stenosis. The exam does not exclude plaque ulceration or embolization.  Continued surveillance recommended.  Original Report Authenticated By: Trecia Rogers, M.D.   Dg Chest Port 1 View  03/14/2012  *RADIOLOGY REPORT*  Clinical Data: Abnormal MRI.  PORTABLE CHEST - 1 VIEW  Comparison: 09/01/2005  Findings: Cardiomegaly.  Bibasilar atelectasis present.  No effusions.  No acute bony abnormality.  IMPRESSION: Cardiomegaly, bibasilar atelectasis.  Original Report Authenticated By: Raelyn Number, M.D.   ECHO: Pending  Microbiology: No results found for this or any previous visit (from the past 240 hour(s)).   Labs: Results for orders placed during the hospital encounter of 03/14/12 (from the past 48 hour(s))  PROTIME-INR     Status: Normal   Collection Time   03/14/12  4:24 PM      Component Value Range Comment   Prothrombin Time 12.5  11.6 - 15.2 (seconds)    INR 0.91  0.00 - 1.49    APTT     Status: Normal   Collection Time   03/14/12  4:24 PM      Component Value Range Comment   aPTT 28  24 - 37 (seconds)   CBC     Status: Abnormal   Collection Time   03/14/12  4:24 PM      Component Value Range Comment   WBC 6.9  4.0 - 10.5 (K/uL)    RBC 3.90 (*) 4.22 - 5.81 (MIL/uL)    Hemoglobin 12.3 (*) 13.0 - 17.0 (g/dL)    HCT 37.3 (*) 39.0 - 52.0 (%)    MCV 95.6  78.0 - 100.0 (fL)    MCH 31.5  26.0 - 34.0 (pg)    MCHC 33.0  30.0 - 36.0 (g/dL)    RDW 13.6  11.5 - 15.5 (%)    Platelets 214  150 - 400 (K/uL)   DIFFERENTIAL     Status:  Normal   Collection Time   03/14/12  4:24 PM      Component Value Range Comment   Neutrophils Relative 51  43 - 77 (%)    Neutro Abs 3.5  1.7 - 7.7 (K/uL)    Lymphocytes Relative 32  12 - 46 (%)    Lymphs Abs 2.2  0.7 - 4.0  (K/uL)    Monocytes Relative 11  3 - 12 (%)    Monocytes Absolute 0.8  0.1 - 1.0 (K/uL)    Eosinophils Relative 5  0 - 5 (%)    Eosinophils Absolute 0.4  0.0 - 0.7 (K/uL)    Basophils Relative 0  0 - 1 (%)    Basophils Absolute 0.0  0.0 - 0.1 (K/uL)   COMPREHENSIVE METABOLIC PANEL     Status: Abnormal   Collection Time   03/14/12  4:24 PM      Component Value Range Comment   Sodium 138  135 - 145 (mEq/L)    Potassium 4.7  3.5 - 5.1 (mEq/L)    Chloride 103  96 - 112 (mEq/L)    CO2 27  19 - 32 (mEq/L)    Glucose, Bld 134 (*) 70 - 99 (mg/dL)    BUN 33 (*) 6 - 23 (mg/dL)    Creatinine, Ser 2.53 (*) 0.50 - 1.35 (mg/dL)    Calcium 9.9  8.4 - 10.5 (mg/dL)    Total Protein 7.6  6.0 - 8.3 (g/dL)    Albumin 3.6  3.5 - 5.2 (g/dL)    AST 29  0 - 37 (U/L)    ALT 29  0 - 53 (U/L)    Alkaline Phosphatase 95  39 - 117 (U/L)    Total Bilirubin 0.2 (*) 0.3 - 1.2 (mg/dL)    GFR calc non Af Amer 23 (*) >90 (mL/min)    GFR calc Af Amer 26 (*) >90 (mL/min)   CK TOTAL AND CKMB     Status: Abnormal   Collection Time   03/14/12  4:24 PM      Component Value Range Comment   Total CK 195  7 - 232 (U/L)    CK, MB 5.8 (*) 0.3 - 4.0 (ng/mL)    Relative Index 3.0 (*) 0.0 - 2.5    TROPONIN I     Status: Normal   Collection Time   03/14/12  4:24 PM      Component Value Range Comment   Troponin I <0.30  <0.30 (ng/mL)   GLUCOSE, CAPILLARY     Status: Abnormal   Collection Time   03/14/12  9:13 PM      Component Value Range Comment   Glucose-Capillary 111 (*) 70 - 99 (mg/dL)    Comment 1 Documented in Chart      Comment 2 Notify RN     URINALYSIS, ROUTINE W REFLEX MICROSCOPIC     Status: Abnormal   Collection Time   03/14/12 10:17 PM      Component Value Range Comment   Color, Urine STRAW (*) YELLOW     APPearance CLEAR  CLEAR     Specific Gravity, Urine 1.010  1.005 - 1.030     pH 6.5  5.0 - 8.0     Glucose, UA NEGATIVE  NEGATIVE (mg/dL)    Hgb urine dipstick TRACE (*) NEGATIVE     Bilirubin Urine  NEGATIVE  NEGATIVE     Ketones, ur NEGATIVE  NEGATIVE (mg/dL)    Protein, ur 30 (*) NEGATIVE (mg/dL)  Urobilinogen, UA 0.2  0.0 - 1.0 (mg/dL)    Nitrite NEGATIVE  NEGATIVE     Leukocytes, UA NEGATIVE  NEGATIVE    URINE MICROSCOPIC-ADD ON     Status: Normal   Collection Time   03/14/12 10:17 PM      Component Value Range Comment   Squamous Epithelial / LPF RARE  RARE     WBC, UA 0-2  <3 (WBC/hpf)    RBC / HPF 3-6  <3 (RBC/hpf)    Bacteria, UA RARE  RARE    COMPREHENSIVE METABOLIC PANEL     Status: Abnormal   Collection Time   03/15/12  5:38 AM      Component Value Range Comment   Sodium 136  135 - 145 (mEq/L)    Potassium 4.2  3.5 - 5.1 (mEq/L)    Chloride 103  96 - 112 (mEq/L)    CO2 26  19 - 32 (mEq/L)    Glucose, Bld 175 (*) 70 - 99 (mg/dL)    BUN 30 (*) 6 - 23 (mg/dL)    Creatinine, Ser 2.20 (*) 0.50 - 1.35 (mg/dL)    Calcium 9.2  8.4 - 10.5 (mg/dL)    Total Protein 6.6  6.0 - 8.3 (g/dL)    Albumin 3.1 (*) 3.5 - 5.2 (g/dL)    AST 25  0 - 37 (U/L)    ALT 23  0 - 53 (U/L)    Alkaline Phosphatase 76  39 - 117 (U/L)    Total Bilirubin 0.3  0.3 - 1.2 (mg/dL)    GFR calc non Af Amer 27 (*) >90 (mL/min)    GFR calc Af Amer 31 (*) >90 (mL/min)   CBC     Status: Abnormal   Collection Time   03/15/12  5:38 AM      Component Value Range Comment   WBC 5.7  4.0 - 10.5 (K/uL)    RBC 3.79 (*) 4.22 - 5.81 (MIL/uL)    Hemoglobin 11.9 (*) 13.0 - 17.0 (g/dL)    HCT 36.0 (*) 39.0 - 52.0 (%)    MCV 95.0  78.0 - 100.0 (fL)    MCH 31.4  26.0 - 34.0 (pg)    MCHC 33.1  30.0 - 36.0 (g/dL)    RDW 13.6  11.5 - 15.5 (%)    Platelets 223  150 - 400 (K/uL)   LIPID PANEL     Status: Abnormal   Collection Time   03/15/12  5:39 AM      Component Value Range Comment   Cholesterol 223 (*) 0 - 200 (mg/dL)    Triglycerides 67  <150 (mg/dL)    HDL 88  >39 (mg/dL)    Total CHOL/HDL Ratio 2.5      VLDL 13  0 - 40 (mg/dL)    LDL Cholesterol 122 (*) 0 - 99 (mg/dL)   GLUCOSE, CAPILLARY     Status: Abnormal    Collection Time   03/15/12  7:19 AM      Component Value Range Comment   Glucose-Capillary 126 (*) 70 - 99 (mg/dL)    Comment 1 Notify RN     GLUCOSE, CAPILLARY     Status: Abnormal   Collection Time   03/15/12 11:03 AM      Component Value Range Comment   Glucose-Capillary 308 (*) 70 - 99 (mg/dL)    Comment 1 Notify RN        HPI : The patient is a 78 year old man with a multitude  of medical problems including seizure disorder, chronic kidney disease, diabetes, hypertension, hyperlipidemia, and gait disorder, who presented to the emergency department for further evaluation and management of an acute left lacunar stroke. His primary care physician, Dr. Moshe Cipro, ordered an outpatient MRI of his brain in evaluation of a recent diagnosis of seizure disorder and other neurological symptoms. The patient had no new difficulty swallowing, difficulty speaking, or focal weakness. He had no recent low blood sugars at home. In the emergency department, he was noted to be hypertensive with a blood pressure ranging from 152/75-176/78. He was borderline bradycardic with a heart rate ranging from 57-61 beats per minute. He was afebrile. He was oxygenating 97% on room air. His chest x-ray revealed bibasilar atelectasis. His lab data were significant for a BUN of 33, creatinine of 2.53, glucose 134, normal cardiac enzymes, and a hemoglobin of 12.3. He was admitted for further evaluation and management.  HOSPITAL COURSE: The patient passed his swallow evaluation in the emergency department. He was subsequently restarted on most of not all of his chronic medications. Aspirin dosing was increased to 325 mg daily. Plavix was also started daily. However, during my conversation with the patient and his wife, he had not been taking aspirin every day but had been missing taking it most days of the week. Therefore, at the time of discharge, the 325 mg dosing of aspirin and Plavix were discontinued. He was restarted on aspirin  81 mg daily and encouraged to take it consistently. For further evaluation, a number of studies were ordered and neurologist, Dr. Merlene Laughter was consulted. His carotid ultrasound revealed no significant ICA stenosis. The results of his fasting lipid profile were dictated above. He was maintained on statin therapy. His hemoglobin A1c was 8.4. His urinalysis was not suggestive of infection. The results of the 2-D echocardiogram were pending at the time of discharge. Of note, the patient's TSH last week was noted to be elevated at 43 indicating severe hypothyroidism. He had been prescribed 150 mg of Synthroid daily, however, he admitted not taking it every day. Therefore the dosing was not adjusted. Further management will be deferred to his primary care physician Dr. Moshe Cipro.  Dr. Merlene Laughter evaluated the patient. Although, he did think it was reasonable to increase the dose of aspirin, following my conversation with the patient and his wife, it appeared to be more reasonable to restart aspirin at 81 mg daily but to encourage the patient to take it more consistently. Dr. Merlene Laughter did also note the patient's history of noncompliance. He also thought that the patient's recent altered mental status prior to this hospitalization was most consistent with complex partial seizures. Keppra was continued. He noted that the patient's gait impairment was likely secondary to severe osteoarthritis and diabetic polyneuropathy and not necessarily due to the acute stroke.  The patient was evaluated by the physical therapist. During her evaluation, she noted a left knee contracture and mild hip contractures. Nevertheless, with the walker, he did fairly well. She recommended ongoing outpatient physical therapy that had already been set up by Dr. Moshe Cipro.  The patient remained afebrile and hemodynamically stable. He was encouraged and admonished to become more compliant with medication therapy. He was  instructed not to drive. He did  not agree with this but voiced understanding.    Discharge Exam:  Blood pressure 140/78, pulse 68, temperature 98.1 F (36.7 C), temperature source Oral, resp. rate 18, height 5\' 6"  (1.676 m), weight 88.7 kg (195 lb 8.8 oz), SpO2 94.00%.  Lungs: Clear to auscultation bilaterally. Heart: S1, S2, with no murmurs rubs or gallops. Abdomen: Positive bowel sounds, soft, nontender, nondistended. Extremities: No pedal edema. Neurologic: He is alert and oriented x3. Cranial nerves II through XII grossly intact. No pronator drift. Hand grip strong and 5 over 5 bilaterally. Lower extremity strength 5 over 5 in the sitting position. Sensation grossly intact.   Discharge Orders    Future Appointments: Provider: Department: Dept Phone: Center:   03/22/2012 1:45 PM [name], MD [place] [id number] [facility]   05/04/2012 1:30 PM [name], MD [facility] [phone] [facility]     Future Orders Please Complete By Expires   Diet - low sodium heart healthy      Diet Carb Modified      Increase activity slowly      Discharge instructions      Comments:   DO NOT DRIVE AT ALL BECAUSE OF [place]. THIS SHOULD BE FURTHER DISCUSSED WITH YOUR PRIMARY CARE DOCTOR AND NEUROLOGIST. TAKE MEDICATIONS DAILY AS PRESCRIBED.   Driving Restrictions      Comments:   NO DRIVING.      Follow-up Information    Follow up with Tula Nakayama, MD on 03/22/2012. (AT 1:45 PM)    Contact information:   [address] [phone]          Total discharge time: 40 minutes.  Signed: Vrishank Moster 03/15/2012, 3:40 PM

## 2012-03-15 NOTE — Consult Note (Signed)
Reason for Consult: Referring Physician:   [name] is an 78 y.o. male.  HPI:  Past Medical History  Diagnosis Date  . Diabetes mellitus   . Hypothyroidism   . Hypertension   . Peripheral vascular disease, unspecified   . Unspecified hypothyroidism   . Depressive disorder, not elsewhere classified   . Obesity   . Other and unspecified hyperlipidemia   . Unspecified essential hypertension   . Pain in joint, lower leg   . Osteoarthrosis, unspecified whether generalized or localized, lower leg   . Derangement of meniscus, not elsewhere classified   . Lumbago   . Spondylosis of unspecified site without mention of myelopathy   . Spinal stenosis, unspecified region other than cervical   . Backache, unspecified   . Family history of diabetes mellitus   . CKD (chronic kidney disease) stage 3, GFR 30-59 ml/min   . Seizures   . Complete lesion of cervical spinal cord 03/14/3012    Stable since 2006  . Lacunar stroke, acute 03/14/2012  . Bradycardia 03/15/2012    Past Surgical History  Procedure Date  . Kidney stones left x2 1975  . Kidney surgery     Family History  Problem Relation Age of Onset  . Diabetes Mother   . Prostate cancer Father   . Diabetes Sister   . Diabetes Brother   . Diabetes Brother   . Hypertension Brother   . Hypertension Brother   . Hypertension Brother     Social History:  reports that he has never smoked. He does not have any smokeless tobacco history on file. He reports that he does not drink alcohol or use illicit drugs.  Allergies:  Allergies  Allergen Reactions  . Bayer Advanced Aspirin (Aspirin) Nausea And Vomiting  . Penicillins     Medications:  Prior to Admission medications   Medication Sig Start Date End Date Taking? Authorizing Provider  amLODipine (NORVASC) 10 MG tablet Take 10 mg by mouth daily. BLOOD PRESSURE 02/04/12 02/03/13 Yes [name], MD  aspirin (ASPIRIN LOW DOSE) 81 MG EC tablet Take 81 mg by mouth daily. FOR  HEART (BLOOD THINNER)   Yes Historical Provider, MD  BEE POLLEN PO Take 1 tablet by mouth daily.   Yes Historical Provider, MD  glipiZIDE (GLUCOTROL) 10 MG tablet Take 10 mg by mouth 2 (two) times daily. FOR BLOOD SUGAR 02/04/12 02/03/13 Yes [name], MD  levETIRAcetam (KEPPRA) 500 MG tablet Take 500 mg by mouth 2 (two) times daily. FOR SEIZURE 03/05/12 03/05/13 Yes Hoy Morn, MD  Levothyroxine Sodium 150 MCG CAPS Take 150 mcg by mouth daily. FOR THYROID 03/07/12  Yes [name], MD  losartan (COZAAR) 100 MG tablet Take 100 mg by mouth daily. FOR HEART/BLOOD PRESSURE 03/07/12 03/07/13 Yes [name], MD  lovastatin (MEVACOR) 40 MG tablet Take 40 mg by mouth at bedtime. FOR CHOLESTEROL 03/24/11  Yes [name], MD  oxybutynin (DITROPAN) 5 MG tablet Take 10 mg by mouth 2 (two) times daily. FOR BLADDER   Yes Historical Provider, MD  FLUoxetine (PROZAC) 10 MG tablet Take 10 mg by mouth daily. FOR MOOD/DEPRESSION 03/07/12 06/05/12  [name], MD  furosemide (LASIX) 20 MG tablet  11/10/11   [name], MD  linagliptin (TRADJENTA) 5 MG TABS tablet Take 5 mg by mouth daily. FOR BLOOD SUGARS    Historical Provider, MD    Scheduled Meds:   . amLODipine  10 mg Oral Daily  .  antiseptic oral rinse  15 mL Mouth Rinse BID  . clopidogrel  75 mg Oral Q breakfast  . enoxaparin  30 mg Subcutaneous Q24H  . FLUoxetine  10 mg Oral Daily  . glipiZIDE  10 mg Oral BID AC  . insulin aspart  0-5 Units Subcutaneous QHS  . insulin aspart  0-9 Units Subcutaneous TID WC  . levETIRAcetam  500 mg Oral BID  . levothyroxine  150 mcg Oral QAC breakfast  . losartan  100 mg Oral Daily  . oxybutynin  10 mg Oral BID  . pneumococcal 23 valent vaccine  0.5 mL Intramuscular Tomorrow-1000  . simvastatin  20 mg Oral q1800   Continuous Infusions:   . sodium chloride 40 mL/hr at 03/15/12 0832  . DISCONTD: sodium chloride     PRN Meds:.ondansetron (ZOFRAN) IV, senna-docusate   Results  for orders placed during the hospital encounter of 03/14/12 (from the past 48 hour(s))  PROTIME-INR     Status: Normal   Collection Time   03/14/12  4:24 PM      Component Value Range Comment   Prothrombin Time 12.5  11.6 - 15.2 (seconds)    INR 0.91  0.00 - 1.49    APTT     Status: Normal   Collection Time   03/14/12  4:24 PM      Component Value Range Comment   aPTT 28  24 - 37 (seconds)   CBC     Status: Abnormal   Collection Time   03/14/12  4:24 PM      Component Value Range Comment   WBC 6.9  4.0 - 10.5 (K/uL)    RBC 3.90 (*) 4.22 - 5.81 (MIL/uL)    Hemoglobin 12.3 (*) 13.0 - 17.0 (g/dL)    HCT 37.3 (*) 39.0 - 52.0 (%)    MCV 95.6  78.0 - 100.0 (fL)    MCH 31.5  26.0 - 34.0 (pg)    MCHC 33.0  30.0 - 36.0 (g/dL)    RDW 13.6  11.5 - 15.5 (%)    Platelets 214  150 - 400 (K/uL)   DIFFERENTIAL     Status: Normal   Collection Time   03/14/12  4:24 PM      Component Value Range Comment   Neutrophils Relative 51  43 - 77 (%)    Neutro Abs 3.5  1.7 - 7.7 (K/uL)    Lymphocytes Relative 32  12 - 46 (%)    Lymphs Abs 2.2  0.7 - 4.0 (K/uL)    Monocytes Relative 11  3 - 12 (%)    Monocytes Absolute 0.8  0.1 - 1.0 (K/uL)    Eosinophils Relative 5  0 - 5 (%)    Eosinophils Absolute 0.4  0.0 - 0.7 (K/uL)    Basophils Relative 0  0 - 1 (%)    Basophils Absolute 0.0  0.0 - 0.1 (K/uL)   COMPREHENSIVE METABOLIC PANEL     Status: Abnormal   Collection Time   03/14/12  4:24 PM      Component Value Range Comment   Sodium 138  135 - 145 (mEq/L)    Potassium 4.7  3.5 - 5.1 (mEq/L)    Chloride 103  96 - 112 (mEq/L)    CO2 27  19 - 32 (mEq/L)    Glucose, Bld 134 (*) 70 - 99 (mg/dL)    BUN 33 (*) 6 - 23 (mg/dL)    Creatinine, Ser 2.53 (*) 0.50 - 1.35 (mg/dL)  Calcium 9.9  8.4 - 10.5 (mg/dL)    Total Protein 7.6  6.0 - 8.3 (g/dL)    Albumin 3.6  3.5 - 5.2 (g/dL)    AST 29  0 - 37 (U/L)    ALT 29  0 - 53 (U/L)    Alkaline Phosphatase 95  39 - 117 (U/L)    Total Bilirubin 0.2 (*) 0.3 - 1.2  (mg/dL)    GFR calc non Af Amer 23 (*) >90 (mL/min)    GFR calc Af Amer 26 (*) >90 (mL/min)   CK TOTAL AND CKMB     Status: Abnormal   Collection Time   03/14/12  4:24 PM      Component Value Range Comment   Total CK 195  7 - 232 (U/L)    CK, MB 5.8 (*) 0.3 - 4.0 (ng/mL)    Relative Index 3.0 (*) 0.0 - 2.5    TROPONIN I     Status: Normal   Collection Time   03/14/12  4:24 PM      Component Value Range Comment   Troponin I <0.30  <0.30 (ng/mL)   GLUCOSE, CAPILLARY     Status: Abnormal   Collection Time   03/14/12  9:13 PM      Component Value Range Comment   Glucose-Capillary 111 (*) 70 - 99 (mg/dL)    Comment 1 Documented in Chart      Comment 2 Notify RN     URINALYSIS, ROUTINE W REFLEX MICROSCOPIC     Status: Abnormal   Collection Time   03/14/12 10:17 PM      Component Value Range Comment   Color, Urine STRAW (*) YELLOW     APPearance CLEAR  CLEAR     Specific Gravity, Urine 1.010  1.005 - 1.030     pH 6.5  5.0 - 8.0     Glucose, UA NEGATIVE  NEGATIVE (mg/dL)    Hgb urine dipstick TRACE (*) NEGATIVE     Bilirubin Urine NEGATIVE  NEGATIVE     Ketones, ur NEGATIVE  NEGATIVE (mg/dL)    Protein, ur 30 (*) NEGATIVE (mg/dL)    Urobilinogen, UA 0.2  0.0 - 1.0 (mg/dL)    Nitrite NEGATIVE  NEGATIVE     Leukocytes, UA NEGATIVE  NEGATIVE    URINE MICROSCOPIC-ADD ON     Status: Normal   Collection Time   03/14/12 10:17 PM      Component Value Range Comment   Squamous Epithelial / LPF RARE  RARE     WBC, UA 0-2  <3 (WBC/hpf)    RBC / HPF 3-6  <3 (RBC/hpf)    Bacteria, UA RARE  RARE    COMPREHENSIVE METABOLIC PANEL     Status: Abnormal   Collection Time   03/15/12  5:38 AM      Component Value Range Comment   Sodium 136  135 - 145 (mEq/L)    Potassium 4.2  3.5 - 5.1 (mEq/L)    Chloride 103  96 - 112 (mEq/L)    CO2 26  19 - 32 (mEq/L)    Glucose, Bld 175 (*) 70 - 99 (mg/dL)    BUN 30 (*) 6 - 23 (mg/dL)    Creatinine, Ser 2.20 (*) 0.50 - 1.35 (mg/dL)    Calcium 9.2  8.4 - 10.5  (mg/dL)    Total Protein 6.6  6.0 - 8.3 (g/dL)    Albumin 3.1 (*) 3.5 - 5.2 (g/dL)    AST 25  0 - 37 (U/L)    ALT 23  0 - 53 (U/L)    Alkaline Phosphatase 76  39 - 117 (U/L)    Total Bilirubin 0.3  0.3 - 1.2 (mg/dL)    GFR calc non Af Amer 27 (*) >90 (mL/min)    GFR calc Af Amer 31 (*) >90 (mL/min)   CBC     Status: Abnormal   Collection Time   03/15/12  5:38 AM      Component Value Range Comment   WBC 5.7  4.0 - 10.5 (K/uL)    RBC 3.79 (*) 4.22 - 5.81 (MIL/uL)    Hemoglobin 11.9 (*) 13.0 - 17.0 (g/dL)    HCT 36.0 (*) 39.0 - 52.0 (%)    MCV 95.0  78.0 - 100.0 (fL)    MCH 31.4  26.0 - 34.0 (pg)    MCHC 33.1  30.0 - 36.0 (g/dL)    RDW 13.6  11.5 - 15.5 (%)    Platelets 223  150 - 400 (K/uL)   LIPID PANEL     Status: Abnormal   Collection Time   03/15/12  5:39 AM      Component Value Range Comment   Cholesterol 223 (*) 0 - 200 (mg/dL)    Triglycerides 67  <150 (mg/dL)    HDL 88  >39 (mg/dL)    Total CHOL/HDL Ratio 2.5      VLDL 13  0 - 40 (mg/dL)    LDL Cholesterol 122 (*) 0 - 99 (mg/dL)   GLUCOSE, CAPILLARY     Status: Abnormal   Collection Time   03/15/12  7:19 AM      Component Value Range Comment   Glucose-Capillary 126 (*) 70 - 99 (mg/dL)    Comment 1 Notify RN       Mr Brain Wo Contrast  03/14/2012  *RADIOLOGY REPORT*  Clinical Data: 78 year old male with seizure-like episode.  History of chronic gait disorder.  MRI HEAD WITHOUT CONTRAST  Technique:  Multiplanar, multiecho pulse sequences of the brain and surrounding structures were obtained according to standard protocol without intravenous contrast.  Comparison: Head CT 03/05/2012.  Brain MRI 12/11/2009.  Cervical MRIs 12/11/2009, 05/28/2005.  Findings: Punctate area of restricted diffusion in the left parietal lobe cortex or subcortical white matter (series 3 image 15, series 6 image 16).  No associated mass effect or hemorrhage. Diffusion elsewhere is within normal limits.  Chronic confluent T2 and FLAIR hyperintensity in the  cerebral white matter, maximal in the periatrial regions.  Chronic lacunar infarct near the genu of the right internal capsule at the caudate is new since 2011.  Patchy T2 hyperintensity in the pons is mildly increased since 2011.  Negative cerebellum.  Visualized upper cervical spine remarkable for a chronic cord lesion on the right at C1-C2, partially visible on today's T2 axial image (series 6 image 1).  This is grossly stable since the 2006 cervical exam.  Major intracranial vascular flow voids are stable.  Stable cerebral volume.  No ventriculomegaly. No midline shift, mass effect, or evidence of mass lesion.  No acute intracranial hemorrhage identified.  Negative pituitary. Visualized bone marrow signal is within normal limits.  Visualized orbit soft tissues are within normal limits.  Mild paranasal sinus mucosal thickening is stable.  Mastoids are clear. Negative scalp soft tissues.  IMPRESSION: 1.  Acute lacunar type infarct in the left parietal lobe cortex or subcortical white matter.  No mass effect or hemorrhage. 2.  Underlying chronic small vessel disease  suspected with some progression since 2011 (right basal ganglia). 3.  Chronic C1-C2 spinal cord lesion appears grossly stable since 05/28/2005 cervical MRI.  Combined with the chronically advanced nonspecific cerebral white matter disease, this raises the possibility of chronic demyelination.  Study discussed by telephone with Dr. Tula Nakayama on 03/14/2012 at 1525 hours.  The patient will be transported from Radiology to the Emergency Department in light of these findings for continued evaluation and treatment.  Original Report Authenticated By: Randall An, M.D.   Dg Chest Port 1 View  03/14/2012  *RADIOLOGY REPORT*  Clinical Data: Abnormal MRI.  PORTABLE CHEST - 1 VIEW  Comparison: 09/01/2005  Findings: Cardiomegaly.  Bibasilar atelectasis present.  No effusions.  No acute bony abnormality.  IMPRESSION: Cardiomegaly, bibasilar atelectasis.   Original Report Authenticated By: Raelyn Number, M.D.    Review of Systems  Constitutional: Negative.   HENT: Negative.   Eyes: Negative.   Gastrointestinal: Negative.   Genitourinary: Negative.   Musculoskeletal: Positive for back pain, joint pain and falls.  Skin: Negative.   Neurological: Positive for seizures.  Endo/Heme/Allergies: Negative.   Psychiatric/Behavioral: Negative.    Blood pressure 169/86, pulse 55, temperature 98.2 F (36.8 C), temperature source Oral, resp. rate 20, height 5\' 6"  (1.676 m), weight 88.7 kg (195 lb 8.8 oz), SpO2 99.00%. Physical Exam  Assessment/Plan: See dictation  [place] 03/15/2012, 8:53 AM

## 2012-03-18 ENCOUNTER — Telehealth: Payer: Self-pay

## 2012-03-22 ENCOUNTER — Encounter: Payer: Self-pay | Admitting: Family Medicine

## 2012-03-22 ENCOUNTER — Ambulatory Visit: Payer: Medicare Other | Admitting: Family Medicine

## 2012-03-24 ENCOUNTER — Encounter: Payer: Self-pay | Admitting: Family Medicine

## 2012-03-24 ENCOUNTER — Ambulatory Visit (INDEPENDENT_AMBULATORY_CARE_PROVIDER_SITE_OTHER): Payer: Medicare Other | Admitting: Family Medicine

## 2012-03-24 VITALS — BP 140/76 | HR 79 | Resp 18 | Ht 67.0 in | Wt 201.0 lb

## 2012-03-24 DIAGNOSIS — E1165 Type 2 diabetes mellitus with hyperglycemia: Secondary | ICD-10-CM

## 2012-03-24 DIAGNOSIS — E669 Obesity, unspecified: Secondary | ICD-10-CM

## 2012-03-24 DIAGNOSIS — N058 Unspecified nephritic syndrome with other morphologic changes: Secondary | ICD-10-CM

## 2012-03-24 DIAGNOSIS — I639 Cerebral infarction, unspecified: Secondary | ICD-10-CM

## 2012-03-24 DIAGNOSIS — IMO0002 Reserved for concepts with insufficient information to code with codable children: Secondary | ICD-10-CM

## 2012-03-24 DIAGNOSIS — I6350 Cerebral infarction due to unspecified occlusion or stenosis of unspecified cerebral artery: Secondary | ICD-10-CM | POA: Diagnosis not present

## 2012-03-24 DIAGNOSIS — E785 Hyperlipidemia, unspecified: Secondary | ICD-10-CM | POA: Diagnosis not present

## 2012-03-24 DIAGNOSIS — E039 Hypothyroidism, unspecified: Secondary | ICD-10-CM

## 2012-03-24 DIAGNOSIS — G40209 Localization-related (focal) (partial) symptomatic epilepsy and epileptic syndromes with complex partial seizures, not intractable, without status epilepticus: Secondary | ICD-10-CM

## 2012-03-24 DIAGNOSIS — E1129 Type 2 diabetes mellitus with other diabetic kidney complication: Secondary | ICD-10-CM

## 2012-03-24 DIAGNOSIS — I10 Essential (primary) hypertension: Secondary | ICD-10-CM

## 2012-03-24 NOTE — Patient Instructions (Addendum)
F/u in mid July.  Please call  Dr Merlene Laughter for  Your follow up appointment.  Please check blood sugars every morning, have your wife write them down. If fasting blood sugars are over 150 most of the time, you need to call Dr Iran Planas so he can adjust your medicine or see you in the office.   No Driving at this time.  Please make sureyou take akll medication every day as prescribed   TSH and chem 7 in July

## 2012-03-24 NOTE — Progress Notes (Signed)
  Subjective:    Patient ID: Chase [name], male    DOB: 20-Dec-1930, 78 y.o.   MRN: [id number]  HPI Pt in with his wife and son for follow up visit from recent hospitalization for acute cVA. Pt had also recently had development of complex partial seizure d/o and had not been able to see his neurologist since that time. In today again family is asking about driving safety, clearly, not an option with so recent a x of seizures. Blood sugars are neither being regularly tested or recorded, the importance of attention to this with close colaberation with his endocrinologist is stressed    Review of Systems See HPI Denies recent fever or chills. Denies sinus pressure, nasal congestion, ear pain or sore throat. Denies chest congestion, productive cough or wheezing. Denies chest pains, palpitations and leg swelling Denies abdominal pain, nausea, vomiting,diarrhea or constipation.   Denies dysuria, frequency, hesitancy or incontinence. Chronic  joint pain, swelling and limitation in mobility.no interest in therapy currently Denies headaches, seizures,chronic extremity  numbness, or tingling. C/o increased  Depression,since driving prohibited, denies  anxiety or insomnia. Denies skin break down or rash.        Objective:   Physical Exam Patient alert and oriented and in no cardiopulmonary distress.  HEENT: No facial asymmetry, EOMI, no sinus tenderness,  oropharynx pink and moist.  Neck supple no adenopathy.  Chest: Clear to auscultation bilaterally.  CVS: S1, S2 no murmurs, no S3.  ABD: Soft non tender. Bowel sounds normal.  Ext: No edema  [id number] though Adequate ROM spine,adequate in  shoulders, hips and knees.  Skin: Intact, no ulcerations or rash noted.  Psych: Good eye contact, blunted  affect. Memory loss, ot anxious or depressed appearing.  CNS: CN 2-12 intact, power,  normal throughout.Decreased sensation in hands and feet        Assessment & Plan:

## 2012-03-26 NOTE — Assessment & Plan Note (Signed)
Again encouraged pt and family to change to local endo for improved care and control, due to proximity of office

## 2012-03-26 NOTE — Assessment & Plan Note (Signed)
Sub optimal, though adequate control

## 2012-03-26 NOTE — Assessment & Plan Note (Signed)
Deteriorated and uncontrolled. Adherence to low fat diet and medication stressed

## 2012-03-26 NOTE — Assessment & Plan Note (Signed)
Uncontrolled due to non compliance,i mportance of same stressed

## 2012-03-26 NOTE — Assessment & Plan Note (Signed)
Recent diagnosis, pt encouraged to f/u with neurology, he is to call for appt. No driving at this time

## 2012-03-26 NOTE — Assessment & Plan Note (Signed)
Recently hospitalized with acute stroke , no residual weakness or numbness

## 2012-03-30 DIAGNOSIS — G40909 Epilepsy, unspecified, not intractable, without status epilepticus: Secondary | ICD-10-CM | POA: Diagnosis not present

## 2012-03-30 DIAGNOSIS — M159 Polyosteoarthritis, unspecified: Secondary | ICD-10-CM | POA: Diagnosis not present

## 2012-03-31 ENCOUNTER — Telehealth: Payer: Self-pay | Admitting: Family Medicine

## 2012-03-31 NOTE — Telephone Encounter (Signed)
Called wife and she stated that husband's blood sugar was 43 when he woke up this morning.  He rechecked after eating and it was in the 180s.

## 2012-04-01 NOTE — Telephone Encounter (Signed)
Patient aware and will call Dr Loletta Specter

## 2012-04-01 NOTE — Telephone Encounter (Signed)
Needs to make appt with the endo, dr Loletta Specter and discuss his diabetes with him, 53 is too low

## 2012-04-05 ENCOUNTER — Other Ambulatory Visit: Payer: Self-pay

## 2012-04-05 MED ORDER — LOVASTATIN 40 MG PO TABS: 40.0000 mg | ORAL_TABLET | Freq: Every day | ORAL | Status: DC

## 2012-04-13 DIAGNOSIS — E1159 Type 2 diabetes mellitus with other circulatory complications: Secondary | ICD-10-CM | POA: Diagnosis not present

## 2012-04-13 DIAGNOSIS — E119 Type 2 diabetes mellitus without complications: Secondary | ICD-10-CM | POA: Diagnosis not present

## 2012-04-15 DIAGNOSIS — R569 Unspecified convulsions: Secondary | ICD-10-CM | POA: Diagnosis not present

## 2012-04-27 ENCOUNTER — Encounter: Payer: Self-pay | Admitting: Family Medicine

## 2012-04-28 NOTE — Telephone Encounter (Signed)
Noted  

## 2012-04-29 DIAGNOSIS — M159 Polyosteoarthritis, unspecified: Secondary | ICD-10-CM | POA: Diagnosis not present

## 2012-04-29 DIAGNOSIS — E119 Type 2 diabetes mellitus without complications: Secondary | ICD-10-CM | POA: Diagnosis not present

## 2012-04-29 DIAGNOSIS — R569 Unspecified convulsions: Secondary | ICD-10-CM | POA: Diagnosis not present

## 2012-04-29 DIAGNOSIS — I10 Essential (primary) hypertension: Secondary | ICD-10-CM | POA: Diagnosis not present

## 2012-05-04 ENCOUNTER — Ambulatory Visit: Payer: 59 | Admitting: Family Medicine

## 2012-06-28 ENCOUNTER — Encounter: Payer: Self-pay | Admitting: Family Medicine

## 2012-06-28 ENCOUNTER — Ambulatory Visit (INDEPENDENT_AMBULATORY_CARE_PROVIDER_SITE_OTHER): Payer: Medicare Other | Admitting: Family Medicine

## 2012-06-28 VITALS — BP 132/84 | HR 69 | Resp 18 | Ht 67.0 in | Wt 206.0 lb

## 2012-06-28 DIAGNOSIS — M1710 Unilateral primary osteoarthritis, unspecified knee: Secondary | ICD-10-CM

## 2012-06-28 DIAGNOSIS — IMO0002 Reserved for concepts with insufficient information to code with codable children: Secondary | ICD-10-CM

## 2012-06-28 DIAGNOSIS — F329 Major depressive disorder, single episode, unspecified: Secondary | ICD-10-CM

## 2012-06-28 DIAGNOSIS — Z91199 Patient's noncompliance with other medical treatment and regimen due to unspecified reason: Secondary | ICD-10-CM

## 2012-06-28 DIAGNOSIS — E785 Hyperlipidemia, unspecified: Secondary | ICD-10-CM | POA: Diagnosis not present

## 2012-06-28 DIAGNOSIS — Z9119 Patient's noncompliance with other medical treatment and regimen: Secondary | ICD-10-CM

## 2012-06-28 DIAGNOSIS — N058 Unspecified nephritic syndrome with other morphologic changes: Secondary | ICD-10-CM

## 2012-06-28 DIAGNOSIS — E039 Hypothyroidism, unspecified: Secondary | ICD-10-CM | POA: Diagnosis not present

## 2012-06-28 DIAGNOSIS — M899 Disorder of bone, unspecified: Secondary | ICD-10-CM

## 2012-06-28 DIAGNOSIS — F3289 Other specified depressive episodes: Secondary | ICD-10-CM

## 2012-06-28 DIAGNOSIS — G40209 Localization-related (focal) (partial) symptomatic epilepsy and epileptic syndromes with complex partial seizures, not intractable, without status epilepticus: Secondary | ICD-10-CM

## 2012-06-28 DIAGNOSIS — I10 Essential (primary) hypertension: Secondary | ICD-10-CM

## 2012-06-28 DIAGNOSIS — M25511 Pain in right shoulder: Secondary | ICD-10-CM

## 2012-06-28 DIAGNOSIS — E1165 Type 2 diabetes mellitus with hyperglycemia: Secondary | ICD-10-CM

## 2012-06-28 DIAGNOSIS — M25519 Pain in unspecified shoulder: Secondary | ICD-10-CM | POA: Diagnosis not present

## 2012-06-28 DIAGNOSIS — M949 Disorder of cartilage, unspecified: Secondary | ICD-10-CM | POA: Diagnosis not present

## 2012-06-28 DIAGNOSIS — E1129 Type 2 diabetes mellitus with other diabetic kidney complication: Secondary | ICD-10-CM

## 2012-06-28 LAB — CBC
HCT: 34.3 % — ABNORMAL LOW (ref 39.0–52.0)
Hemoglobin: 11.3 g/dL — ABNORMAL LOW (ref 13.0–17.0)
MCH: 31.1 pg (ref 26.0–34.0)
MCHC: 32.9 g/dL (ref 30.0–36.0)
MCV: 94.5 fL (ref 78.0–100.0)
Platelets: 222 10*3/uL (ref 150–400)
RBC: 3.63 MIL/uL — ABNORMAL LOW (ref 4.22–5.81)
RDW: 14.5 % (ref 11.5–15.5)
WBC: 6.5 10*3/uL (ref 4.0–10.5)

## 2012-06-28 NOTE — Patient Instructions (Addendum)
Annual wellness in 3.5 month  You are referred to Dr Dorris Fetch for management of your diabetes.  You are referred to [facility] for right shoulder pain.  TSH, lipid, cmp and EGFR, TSH and cbc today and vit D, microalb  Stop the 5 medicines discussed, lasix, fluoxetine, naproxen, trajenta, oxybutynin

## 2012-06-29 LAB — COMPLETE METABOLIC PANEL WITH GFR
ALT: 31 U/L (ref 0–53)
AST: 32 U/L (ref 0–37)
Albumin: 3.8 g/dL (ref 3.5–5.2)
Alkaline Phosphatase: 75 U/L (ref 39–117)
BUN: 32 mg/dL — ABNORMAL HIGH (ref 6–23)
CO2: 29 mEq/L (ref 19–32)
Calcium: 9.4 mg/dL (ref 8.4–10.5)
Chloride: 104 mEq/L (ref 96–112)
Creat: 2.46 mg/dL — ABNORMAL HIGH (ref 0.50–1.35)
GFR, Est African American: 28 mL/min — ABNORMAL LOW
GFR, Est Non African American: 24 mL/min — ABNORMAL LOW
Glucose, Bld: 198 mg/dL — ABNORMAL HIGH (ref 70–99)
Potassium: 4.1 mEq/L (ref 3.5–5.3)
Sodium: 139 mEq/L (ref 135–145)
Total Bilirubin: 0.4 mg/dL (ref 0.3–1.2)
Total Protein: 6.9 g/dL (ref 6.0–8.3)

## 2012-06-29 LAB — LIPID PANEL
Cholesterol: 294 mg/dL — ABNORMAL HIGH (ref 0–200)
HDL: 78 mg/dL (ref >39)
LDL Cholesterol: 201 mg/dL — ABNORMAL HIGH (ref 0–99)
Total CHOL/HDL Ratio: 3.8 Ratio
Triglycerides: 76 mg/dL (ref <150)
VLDL: 15 mg/dL (ref 0–40)

## 2012-06-29 LAB — VITAMIN D 25 HYDROXY (VIT D DEFICIENCY, FRACTURES): Vit D, 25-Hydroxy: 13 ng/mL — ABNORMAL LOW (ref 30–89)

## 2012-06-29 LAB — MICROALBUMIN / CREATININE URINE RATIO
Creatinine, Urine: 93.3 mg/dL
Microalb Creat Ratio: 790 mg/g — ABNORMAL HIGH (ref 0.0–30.0)
Microalb, Ur: 73.71 mg/dL — ABNORMAL HIGH (ref 0.00–1.89)

## 2012-06-29 LAB — TSH: TSH: 45.258 u[IU]/mL — ABNORMAL HIGH (ref 0.350–4.500)

## 2012-07-04 DIAGNOSIS — Z9119 Patient's noncompliance with other medical treatment and regimen: Secondary | ICD-10-CM | POA: Insufficient documentation

## 2012-07-04 DIAGNOSIS — Z91199 Patient's noncompliance with other medical treatment and regimen due to unspecified reason: Secondary | ICD-10-CM | POA: Insufficient documentation

## 2012-07-04 NOTE — Assessment & Plan Note (Signed)
Pt to continue to follow with neurology, I still advise against driving till cleared by neurology. Reports no seizure activity since last visit

## 2012-07-04 NOTE — Assessment & Plan Note (Signed)
Worsening pain and reduced mobility, refer to ortho

## 2012-07-04 NOTE — Assessment & Plan Note (Signed)
An ongoing challenge, here with his son today, again family support in his health requested, Will consider [facility] referral if he qualifies based on insurance coverage

## 2012-07-04 NOTE — Assessment & Plan Note (Signed)
Uncontrolled, pt to transfer care to local endo so he can hopefully have closer follow up, and get his diabetes under control The improtance of family involvemnt stressed

## 2012-07-04 NOTE — Assessment & Plan Note (Signed)
Deteriorated and uncontrolled, compliance wih both diet and medication an ongoing challenge

## 2012-07-04 NOTE — Assessment & Plan Note (Signed)
Resolved, discontinue prozac

## 2012-07-04 NOTE — Assessment & Plan Note (Signed)
Uncontrolled, non compliant

## 2012-07-04 NOTE — Progress Notes (Signed)
  Subjective:    Patient ID: Chase [name], male    DOB: 1931-08-14, 78 y.o.   MRN: [id number]  HPI The PT is here for follow up and re-evaluation of chronic medical conditions, medication management and review of any available recent lab and radiology data.  Preventive health is updated, specifically  Cancer screening and Immunization.   Questions or concerns regarding consultations or procedures which the PT has had in the interim are  addressed. The PT denies any adverse reactions to current medications since the last visit. Medication non compliance an ongoing challenge and issue, family support as far as this is concerned is also equally challenging , he is here with his son today. C/o increased and debiliitating right shoulder pain, will refer to ortho per his request       Review of Systems See HPI Denies recent fever or chills. Denies sinus pressure, nasal congestion, ear pain or sore throat. Denies chest congestion, productive cough or wheezing. Denies chest pains, palpitations and does have mild leg swelling Denies abdominal pain, nausea, vomiting,diarrhea or constipation.   Denies dysuria, frequency, hesitancy or incontinence.  Denies headaches, seizures, numbness, or tingling. Denies depression, anxiety or insomnia. Denies skin break down or rash.        Objective:   Physical Exam  Patient alert and in no cardiopulmonary distress.  HEENT: No facial asymmetry, EOMI, no sinus tenderness,  oropharynx pink and moist.  Neck decrease though adequate ROM, no adenopathy.  Chest: Clear to auscultation bilaterally.  CVS: S1, S2 no murmurs, no S3.  ABD: Soft non tender. Bowel sounds normal.  Ext: No edema  MS: Adequate though reduced   ROM spine , hips and knees.Abnormal gait. Decreased ROM right shoulder  Skin: Intact, no ulcerations or rash noted.  Psych: Good eye contact, normal affect. Memory impaired  not anxious or depressed appearing.  CNS: CN 2-12 intact,  power, normal throughout.Decreased sensation in feet       Assessment & Plan:

## 2012-07-04 NOTE — Assessment & Plan Note (Signed)
Controlled, no change in medication DASH diet and commitment to daily physical activity for a minimum of 30 minutes discussed and encouraged, as a part of hypertension management. The importance of attaining a healthy weight is also discussed.  

## 2012-07-12 DIAGNOSIS — E119 Type 2 diabetes mellitus without complications: Secondary | ICD-10-CM | POA: Diagnosis not present

## 2012-07-12 DIAGNOSIS — E1159 Type 2 diabetes mellitus with other circulatory complications: Secondary | ICD-10-CM | POA: Diagnosis not present

## 2012-07-20 DIAGNOSIS — M25819 Other specified joint disorders, unspecified shoulder: Secondary | ICD-10-CM | POA: Diagnosis not present

## 2012-07-28 DIAGNOSIS — E119 Type 2 diabetes mellitus without complications: Secondary | ICD-10-CM | POA: Diagnosis not present

## 2012-07-28 DIAGNOSIS — M159 Polyosteoarthritis, unspecified: Secondary | ICD-10-CM | POA: Diagnosis not present

## 2012-07-28 DIAGNOSIS — I10 Essential (primary) hypertension: Secondary | ICD-10-CM | POA: Diagnosis not present

## 2012-07-28 DIAGNOSIS — R569 Unspecified convulsions: Secondary | ICD-10-CM | POA: Diagnosis not present

## 2012-08-03 DIAGNOSIS — E1165 Type 2 diabetes mellitus with hyperglycemia: Secondary | ICD-10-CM | POA: Diagnosis not present

## 2012-08-03 DIAGNOSIS — E1129 Type 2 diabetes mellitus with other diabetic kidney complication: Secondary | ICD-10-CM | POA: Diagnosis not present

## 2012-08-03 DIAGNOSIS — E039 Hypothyroidism, unspecified: Secondary | ICD-10-CM | POA: Diagnosis not present

## 2012-08-09 DIAGNOSIS — E669 Obesity, unspecified: Secondary | ICD-10-CM | POA: Diagnosis not present

## 2012-08-09 DIAGNOSIS — E1165 Type 2 diabetes mellitus with hyperglycemia: Secondary | ICD-10-CM | POA: Diagnosis not present

## 2012-08-09 DIAGNOSIS — I10 Essential (primary) hypertension: Secondary | ICD-10-CM | POA: Diagnosis not present

## 2012-08-09 DIAGNOSIS — E1129 Type 2 diabetes mellitus with other diabetic kidney complication: Secondary | ICD-10-CM | POA: Diagnosis not present

## 2012-08-09 DIAGNOSIS — E785 Hyperlipidemia, unspecified: Secondary | ICD-10-CM | POA: Diagnosis not present

## 2012-08-10 ENCOUNTER — Encounter (HOSPITAL_COMMUNITY): Payer: Self-pay

## 2012-08-10 ENCOUNTER — Emergency Department (HOSPITAL_COMMUNITY): Payer: Medicare Other

## 2012-08-10 ENCOUNTER — Encounter: Payer: Self-pay | Admitting: Family Medicine

## 2012-08-10 ENCOUNTER — Emergency Department (HOSPITAL_COMMUNITY)
Admission: EM | Admit: 2012-08-10 | Discharge: 2012-08-10 | Disposition: A | Discharge status: Home / Self Care | Payer: Medicare Other | Attending: Emergency Medicine | Admitting: Emergency Medicine

## 2012-08-10 DIAGNOSIS — I739 Peripheral vascular disease, unspecified: Secondary | ICD-10-CM | POA: Insufficient documentation

## 2012-08-10 DIAGNOSIS — Z87442 Personal history of urinary calculi: Secondary | ICD-10-CM | POA: Diagnosis not present

## 2012-08-10 DIAGNOSIS — F329 Major depressive disorder, single episode, unspecified: Secondary | ICD-10-CM | POA: Diagnosis not present

## 2012-08-10 DIAGNOSIS — R1011 Right upper quadrant pain: Secondary | ICD-10-CM | POA: Insufficient documentation

## 2012-08-10 DIAGNOSIS — R1013 Epigastric pain: Secondary | ICD-10-CM | POA: Insufficient documentation

## 2012-08-10 DIAGNOSIS — Z8673 Personal history of transient ischemic attack (TIA), and cerebral infarction without residual deficits: Secondary | ICD-10-CM | POA: Insufficient documentation

## 2012-08-10 DIAGNOSIS — E039 Hypothyroidism, unspecified: Secondary | ICD-10-CM | POA: Insufficient documentation

## 2012-08-10 DIAGNOSIS — N183 Chronic kidney disease, stage 3 unspecified: Secondary | ICD-10-CM | POA: Insufficient documentation

## 2012-08-10 DIAGNOSIS — F3289 Other specified depressive episodes: Secondary | ICD-10-CM | POA: Diagnosis not present

## 2012-08-10 DIAGNOSIS — R112 Nausea with vomiting, unspecified: Secondary | ICD-10-CM | POA: Insufficient documentation

## 2012-08-10 DIAGNOSIS — M1710 Unilateral primary osteoarthritis, unspecified knee: Secondary | ICD-10-CM | POA: Diagnosis not present

## 2012-08-10 DIAGNOSIS — Z79899 Other long term (current) drug therapy: Secondary | ICD-10-CM | POA: Insufficient documentation

## 2012-08-10 DIAGNOSIS — E1142 Type 2 diabetes mellitus with diabetic polyneuropathy: Secondary | ICD-10-CM | POA: Insufficient documentation

## 2012-08-10 DIAGNOSIS — E1149 Type 2 diabetes mellitus with other diabetic neurological complication: Secondary | ICD-10-CM | POA: Insufficient documentation

## 2012-08-10 DIAGNOSIS — K8020 Calculus of gallbladder without cholecystitis without obstruction: Secondary | ICD-10-CM | POA: Diagnosis not present

## 2012-08-10 DIAGNOSIS — I129 Hypertensive chronic kidney disease with stage 1 through stage 4 chronic kidney disease, or unspecified chronic kidney disease: Secondary | ICD-10-CM | POA: Diagnosis not present

## 2012-08-10 DIAGNOSIS — R109 Unspecified abdominal pain: Secondary | ICD-10-CM

## 2012-08-10 DIAGNOSIS — R079 Chest pain, unspecified: Secondary | ICD-10-CM | POA: Insufficient documentation

## 2012-08-10 DIAGNOSIS — J9819 Other pulmonary collapse: Secondary | ICD-10-CM | POA: Diagnosis not present

## 2012-08-10 LAB — CBC WITH DIFFERENTIAL/PLATELET
Basophils Absolute: 0 10*3/uL (ref 0.0–0.1)
Basophils Relative: 0 % (ref 0–1)
Eosinophils Absolute: 0 10*3/uL (ref 0.0–0.7)
Eosinophils Relative: 0 % (ref 0–5)
HCT: 31.8 % — ABNORMAL LOW (ref 39.0–52.0)
Hemoglobin: 10.6 g/dL — ABNORMAL LOW (ref 13.0–17.0)
Lymphocytes Relative: 8 % — ABNORMAL LOW (ref 12–46)
Lymphs Abs: 0.6 10*3/uL — ABNORMAL LOW (ref 0.7–4.0)
MCH: 31.9 pg (ref 26.0–34.0)
MCHC: 33.3 g/dL (ref 30.0–36.0)
MCV: 95.8 fL (ref 78.0–100.0)
Monocytes Absolute: 0.3 10*3/uL (ref 0.1–1.0)
Monocytes Relative: 4 % (ref 3–12)
Neutro Abs: 7.4 10*3/uL (ref 1.7–7.7)
Neutrophils Relative %: 88 % — ABNORMAL HIGH (ref 43–77)
Platelets: 201 10*3/uL (ref 150–400)
RBC: 3.32 MIL/uL — ABNORMAL LOW (ref 4.22–5.81)
RDW: 12.9 % (ref 11.5–15.5)
WBC: 8.4 10*3/uL (ref 4.0–10.5)

## 2012-08-10 LAB — URINALYSIS, ROUTINE W REFLEX MICROSCOPIC
Bilirubin Urine: NEGATIVE
Glucose, UA: >1000 mg/dL — AB
Ketones, ur: NEGATIVE mg/dL
Leukocytes, UA: NEGATIVE
Nitrite: NEGATIVE
Protein, ur: 100 mg/dL — AB
Specific Gravity, Urine: 1.02 (ref 1.005–1.030)
Urobilinogen, UA: 0.2 mg/dL (ref 0.0–1.0)
pH: 6 (ref 5.0–8.0)

## 2012-08-10 LAB — COMPREHENSIVE METABOLIC PANEL
ALT: 17 U/L (ref 0–53)
AST: 19 U/L (ref 0–37)
Albumin: 3.7 g/dL (ref 3.5–5.2)
Alkaline Phosphatase: 90 U/L (ref 39–117)
BUN: 33 mg/dL — ABNORMAL HIGH (ref 6–23)
CO2: 24 mEq/L (ref 19–32)
Calcium: 9.5 mg/dL (ref 8.4–10.5)
Chloride: 102 mEq/L (ref 96–112)
Creatinine, Ser: 2.56 mg/dL — ABNORMAL HIGH (ref 0.50–1.35)
GFR calc Af Amer: 26 mL/min — ABNORMAL LOW (ref >90)
GFR calc non Af Amer: 22 mL/min — ABNORMAL LOW (ref >90)
Glucose, Bld: 323 mg/dL — ABNORMAL HIGH (ref 70–99)
Potassium: 4.7 mEq/L (ref 3.5–5.1)
Sodium: 135 mEq/L (ref 135–145)
Total Bilirubin: 0.3 mg/dL (ref 0.3–1.2)
Total Protein: 7.2 g/dL (ref 6.0–8.3)

## 2012-08-10 LAB — URINE MICROSCOPIC-ADD ON

## 2012-08-10 LAB — LIPASE, BLOOD: Lipase: 24 U/L (ref 11–59)

## 2012-08-10 LAB — TROPONIN I
Troponin I: <0.3 ng/mL (ref <0.30)
Troponin I: <0.3 ng/mL (ref <0.30)

## 2012-08-10 MED ORDER — FAMOTIDINE 20 MG PO TABS
20.0000 mg | ORAL_TABLET | Freq: Once | ORAL | Status: AC
  Administered 2012-08-10: 20 mg via ORAL
  Filled 2012-08-10: qty 1

## 2012-08-10 MED ORDER — BISACODYL 5 MG PO TBEC
5.0000 mg | DELAYED_RELEASE_TABLET | Freq: Once | ORAL | Status: AC
  Administered 2012-08-10: 5 mg via ORAL
  Filled 2012-08-10: qty 1

## 2012-08-10 MED ORDER — ONDANSETRON HCL 4 MG/2ML IJ SOLN
4.0000 mg | Freq: Once | INTRAMUSCULAR | Status: AC
  Administered 2012-08-10: 4 mg via INTRAVENOUS
  Filled 2012-08-10: qty 2

## 2012-08-10 MED ORDER — MORPHINE SULFATE 4 MG/ML IJ SOLN
4.0000 mg | Freq: Once | INTRAMUSCULAR | Status: AC
  Administered 2012-08-10: 4 mg via INTRAVENOUS
  Filled 2012-08-10: qty 1

## 2012-08-10 MED ORDER — ASPIRIN 81 MG PO CHEW
324.0000 mg | CHEWABLE_TABLET | Freq: Once | ORAL | Status: AC
  Administered 2012-08-10: 324 mg via ORAL
  Filled 2012-08-10: qty 4

## 2012-08-10 MED ORDER — BISACODYL 5 MG PO TBEC
5.0000 mg | DELAYED_RELEASE_TABLET | Freq: Every day | ORAL | Status: DC | PRN
Start: 2012-08-10 — End: 2012-09-07

## 2012-08-10 NOTE — ED Notes (Signed)
Pt reports mid-sternal cp that started last night, unsure of what times, +n/v, denies any sob, has taken baking soda w/ no relief

## 2012-08-10 NOTE — ED Notes (Signed)
Patient with no complaints at this time. Respirations even and unlabored. Skin warm/dry. Discharge instructions reviewed with patient at this time. Patient given opportunity to voice concerns/ask questions. IV removed per policy and band-aid applied to site. Patient discharged at this time and left Emergency Department with steady gait.  

## 2012-08-10 NOTE — ED Provider Notes (Signed)
Patient is complaining of bloated, epigastric pain that began last night. Patient states that the pain is almost resolved now. Patient denies having had this pain before. Associated symptoms include nausea and vomiting last night. Patient denies diarrhea. Patient's last bowel movement was 2 days ago and it was normal.   Pt abdomen appears to be bloated. Mild epigastric tenderness now to palpationl   Medical screening examination/treatment/procedure(s) were conducted as a shared visit with non-physician practitioner(s) and myself.  I personally evaluated the patient during the encounter  Rolland Porter, MD, FACEP  I personally performed the services described in this documentation, which was scribed in my presence. The recorded information has been reviewed and considered.   [name], MD 08/10/12 1131

## 2012-08-10 NOTE — ED Provider Notes (Signed)
History     CSN: [id number]  Arrival date & time 08/10/12  0800   First MD Initiated Contact with Patient 08/10/12 [phone]      Chief Complaint  Patient presents with  . Chest Pain    (Consider location/radiation/quality/duration/timing/severity/associated sxs/prior treatment) HPI Comments: Chase [name] presents with lower sternal chest pressure which started around midnight while he was trying to go to sleep.  He reports several episodes of nausea with emesis but denies shortness of breath.  He reports the discomfort occasionally radiates into his upper abdomen.  He states if he could burp he believes his symptoms would resolve.  He has taken baking soda without relief of symptoms.  Discomfort is constant.  He has had no diaphoresis and no palpitations, dizziness or near-syncope.  Significant past medical history includes diabetes hypertension and peripheral vascular disease including chronic kidney disease. He has taken no medicines prior to arrival.  Patient is a 78 y.o. male presenting with chest pain. The history is provided by the patient.  Chest Pain Primary symptoms include abdominal pain, nausea and vomiting. Pertinent negatives for primary symptoms include no fever, no shortness of breath, no palpitations and no dizziness.  Pertinent negatives for associated symptoms include no numbness and no weakness.     Past Medical History  Diagnosis Date  . Diabetes mellitus   . Hypothyroidism   . Hypertension   . Peripheral vascular disease, unspecified   . Unspecified hypothyroidism   . Depressive disorder, not elsewhere classified   . Obesity   . Other and unspecified hyperlipidemia   . Unspecified essential hypertension   . Pain in joint, lower leg   . Osteoarthrosis, unspecified whether generalized or localized, lower leg   . Derangement of meniscus, not elsewhere classified   . Lumbago   . Spondylosis of unspecified site without mention of myelopathy   . Spinal stenosis,  unspecified region other than cervical   . Backache, unspecified   . Family history of diabetes mellitus   . CKD (chronic kidney disease) stage 3, GFR 30-59 ml/min   . Seizures   . Complete lesion of cervical spinal cord 03/14/3012    Stable since 2006  . Lacunar stroke, acute 03/14/2012  . Bradycardia 03/15/2012  . Gait disorder   . Knee contracture   . Diabetic neuropathy     Past Surgical History  Procedure Date  . Kidney stones left x2 1975  . Kidney surgery     Family History  Problem Relation Age of Onset  . Diabetes Mother   . Prostate cancer Father   . Diabetes Sister   . Diabetes Brother   . Diabetes Brother   . Hypertension Brother   . Hypertension Brother   . Hypertension Brother     History  Substance Use Topics  . Smoking status: Never Smoker   . Smokeless tobacco: Not on file  . Alcohol Use: No      Review of Systems  Constitutional: Negative for fever.  HENT: Negative for congestion, sore throat and neck pain.   Eyes: Negative.   Respiratory: Negative for chest tightness and shortness of breath.   Cardiovascular: Positive for chest pain. Negative for palpitations.  Gastrointestinal: Positive for nausea, vomiting and abdominal pain.  Genitourinary: Negative.   Musculoskeletal: Negative for joint swelling and arthralgias.  Skin: Negative.  Negative for rash and wound.  Neurological: Negative for dizziness, weakness, light-headedness, numbness and headaches.  Hematological: Negative.   Psychiatric/Behavioral: Negative.     Allergies  Bayer advanced aspirin and Penicillins  Home Medications   Current Outpatient Rx  Name Route Sig Dispense Refill  . AMLODIPINE BESYLATE 10 MG PO TABS Oral Take 10 mg by mouth daily. BLOOD PRESSURE    . ASPIRIN 81 MG PO TBEC Oral Take 81 mg by mouth daily. FOR HEART (BLOOD THINNER)    . BEE POLLEN PO Oral Take 1 tablet by mouth daily.    Marland Kitchen GLIPIZIDE 10 MG PO TABS Oral Take 10 mg by mouth 2 (two) times daily. FOR  BLOOD SUGAR    . LEVETIRACETAM 500 MG PO TABS Oral Take 500 mg by mouth 2 (two) times daily. FOR SEIZURE    . LEVOTHYROXINE SODIUM 150 MCG PO CAPS Oral Take 150 mcg by mouth daily. FOR THYROID    . LOSARTAN POTASSIUM 100 MG PO TABS Oral Take 100 mg by mouth daily. FOR HEART/BLOOD PRESSURE    . LOVASTATIN 40 MG PO TABS Oral Take 1 tablet (40 mg total) by mouth at bedtime. FOR CHOLESTEROL 30 tablet 3    BP 149/73  Pulse 70  Temp 97.8 F (36.6 C) (Oral)  Resp 20  Ht 5\' 8"  (1.727 m)  Wt 210 lb (95.255 kg)  BMI 31.93 kg/m2  SpO2 96%  Physical Exam  Nursing note and vitals reviewed. Constitutional: He appears well-developed and well-nourished.  HENT:  Head: Normocephalic and atraumatic.  Eyes: Conjunctivae are normal.  Neck: Normal range of motion.  Cardiovascular: Normal rate, regular rhythm, normal heart sounds and intact distal pulses.        Pain actually improves with pressure applied to lower sternum.  Pulmonary/Chest: Effort normal and breath sounds normal. No respiratory distress. He has no wheezes. He exhibits no tenderness.  Abdominal: Soft. Bowel sounds are normal. He exhibits distension. He exhibits no abdominal bruit, no pulsatile midline mass and no mass. There is no hepatosplenomegaly. There is tenderness in the right upper quadrant and epigastric area. There is no rebound, no guarding and no CVA tenderness.       Adipose abdomen.  Pain with palpation epigastric and ruq.    Musculoskeletal: Normal range of motion.       1+ edema bilateral ankles.  Neurological: He is alert.  Skin: Skin is warm and dry.  Psychiatric: He has a normal mood and affect.    ED Course  Procedures (including critical care time)   Labs Reviewed  CBC WITH DIFFERENTIAL  COMPREHENSIVE METABOLIC PANEL  LIPASE, BLOOD  TROPONIN I   No results found.   No diagnosis found.  At reexam prior to discharge home patient's symptoms were resolved.  He did continue to have slightly distended  abdomen different from his normal.  He states he does not have a history of constipation, usually has one to 2 bowel movements per day, but did not have a BM yesterday, concerned that perhaps this was related to his symptoms today.  He was given a Dulcolax suppository prior to discharge home and a small prescription for the same. MDM  Labs and chest x-ray, EKG and CT scan reviewed.  Also discussed patient with Dr. Tomi Bamberger who also saw patient.  EKG and troponin x2 negative to date for acute coronary disease.  Abdomen is soft and nontender at time of discharge home.  With a normal CT scan acute conditions including intestinal obstruction or mass, appendicitis, liver or pancreatic process ruled out.  Patient encouraged to followup with his PCP for recheck if symptoms return otherwise return here for any worsened  symptoms.    Date: 08/10/2012  Rate: 80  Rhythm: normal sinus rhythm  QRS Axis: left  Intervals: normal  ST/T Wave abnormalities: nonspecific T wave changes  Conduction Disutrbances:first-degree A-V block   Narrative Interpretation: T wave inversion in leads I and AvL.  Unchanged from ekg dated 03/19/2012  Old EKG Reviewed: unchanged      Evalee Jefferson, PA 08/10/12 1700

## 2012-08-11 ENCOUNTER — Telehealth: Payer: Self-pay | Admitting: Family Medicine

## 2012-08-11 NOTE — ED Provider Notes (Signed)
See prior note   [name], MD 08/11/12 [phone]

## 2012-08-11 NOTE — Telephone Encounter (Signed)
Has drank whole bottle of mag citrate started lastnight and finished it this am. Laying around, gave him xrays at ER, nothing seen. Doesn't know why stomach is tight. Hasn't been eating. All he wants to do is lay around, states he hurts in his back when he sits up. Fell lastnight, states he didn't injure anything but his back hurts because he can't have BM. When passes gas, does feel some better. Last time he had bowel movement was 3-4 days ago. (Used to having one daily) Wife wants to know what you recommend?

## 2012-08-11 NOTE — Telephone Encounter (Signed)
Spoke with son in law who stated pt was doing better, eating, still no bowel movement. I advised dulcolax suppository or tablet. Chase [name] was out, so I could not spk directly with her

## 2012-08-17 DIAGNOSIS — E1165 Type 2 diabetes mellitus with hyperglycemia: Secondary | ICD-10-CM | POA: Diagnosis not present

## 2012-08-17 DIAGNOSIS — I10 Essential (primary) hypertension: Secondary | ICD-10-CM | POA: Diagnosis not present

## 2012-08-17 DIAGNOSIS — E1129 Type 2 diabetes mellitus with other diabetic kidney complication: Secondary | ICD-10-CM | POA: Diagnosis not present

## 2012-08-17 DIAGNOSIS — E785 Hyperlipidemia, unspecified: Secondary | ICD-10-CM | POA: Diagnosis not present

## 2012-09-07 ENCOUNTER — Emergency Department (HOSPITAL_COMMUNITY): Payer: Medicare Other

## 2012-09-07 ENCOUNTER — Encounter (HOSPITAL_COMMUNITY): Payer: Self-pay | Admitting: *Deleted

## 2012-09-07 ENCOUNTER — Observation Stay (HOSPITAL_COMMUNITY)
Admission: EM | Admit: 2012-09-07 | Discharge: 2012-09-08 | Disposition: A | Discharge status: Home / Self Care | Payer: Medicare Other | Attending: Internal Medicine | Admitting: Internal Medicine

## 2012-09-07 ENCOUNTER — Inpatient Hospital Stay (HOSPITAL_COMMUNITY): Payer: Medicare Other

## 2012-09-07 DIAGNOSIS — D649 Anemia, unspecified: Secondary | ICD-10-CM | POA: Diagnosis not present

## 2012-09-07 DIAGNOSIS — M25519 Pain in unspecified shoulder: Secondary | ICD-10-CM | POA: Diagnosis not present

## 2012-09-07 DIAGNOSIS — E039 Hypothyroidism, unspecified: Secondary | ICD-10-CM | POA: Diagnosis present

## 2012-09-07 DIAGNOSIS — I6350 Cerebral infarction due to unspecified occlusion or stenosis of unspecified cerebral artery: Secondary | ICD-10-CM | POA: Diagnosis not present

## 2012-09-07 DIAGNOSIS — M7989 Other specified soft tissue disorders: Secondary | ICD-10-CM | POA: Insufficient documentation

## 2012-09-07 DIAGNOSIS — E1059 Type 1 diabetes mellitus with other circulatory complications: Secondary | ICD-10-CM | POA: Diagnosis present

## 2012-09-07 DIAGNOSIS — R110 Nausea: Secondary | ICD-10-CM | POA: Diagnosis not present

## 2012-09-07 DIAGNOSIS — R209 Unspecified disturbances of skin sensation: Secondary | ICD-10-CM | POA: Insufficient documentation

## 2012-09-07 DIAGNOSIS — N289 Disorder of kidney and ureter, unspecified: Secondary | ICD-10-CM | POA: Diagnosis not present

## 2012-09-07 DIAGNOSIS — I10 Essential (primary) hypertension: Secondary | ICD-10-CM | POA: Diagnosis not present

## 2012-09-07 DIAGNOSIS — E1122 Type 2 diabetes mellitus with diabetic chronic kidney disease: Secondary | ICD-10-CM | POA: Diagnosis present

## 2012-09-07 DIAGNOSIS — R5381 Other malaise: Secondary | ICD-10-CM | POA: Diagnosis not present

## 2012-09-07 DIAGNOSIS — E119 Type 2 diabetes mellitus without complications: Secondary | ICD-10-CM | POA: Insufficient documentation

## 2012-09-07 DIAGNOSIS — R5383 Other fatigue: Secondary | ICD-10-CM | POA: Diagnosis not present

## 2012-09-07 DIAGNOSIS — E1129 Type 2 diabetes mellitus with other diabetic kidney complication: Secondary | ICD-10-CM | POA: Diagnosis not present

## 2012-09-07 DIAGNOSIS — N058 Unspecified nephritic syndrome with other morphologic changes: Secondary | ICD-10-CM

## 2012-09-07 DIAGNOSIS — N184 Chronic kidney disease, stage 4 (severe): Secondary | ICD-10-CM | POA: Diagnosis present

## 2012-09-07 DIAGNOSIS — I150 Renovascular hypertension: Secondary | ICD-10-CM | POA: Diagnosis present

## 2012-09-07 DIAGNOSIS — G40209 Localization-related (focal) (partial) symptomatic epilepsy and epileptic syndromes with complex partial seizures, not intractable, without status epilepticus: Secondary | ICD-10-CM

## 2012-09-07 DIAGNOSIS — Z23 Encounter for immunization: Secondary | ICD-10-CM | POA: Insufficient documentation

## 2012-09-07 DIAGNOSIS — I639 Cerebral infarction, unspecified: Secondary | ICD-10-CM | POA: Diagnosis present

## 2012-09-07 DIAGNOSIS — E1121 Type 2 diabetes mellitus with diabetic nephropathy: Secondary | ICD-10-CM | POA: Diagnosis present

## 2012-09-07 DIAGNOSIS — R109 Unspecified abdominal pain: Secondary | ICD-10-CM | POA: Diagnosis not present

## 2012-09-07 DIAGNOSIS — E1165 Type 2 diabetes mellitus with hyperglycemia: Secondary | ICD-10-CM

## 2012-09-07 DIAGNOSIS — D638 Anemia in other chronic diseases classified elsewhere: Secondary | ICD-10-CM | POA: Diagnosis present

## 2012-09-07 DIAGNOSIS — IMO0002 Reserved for concepts with insufficient information to code with codable children: Secondary | ICD-10-CM | POA: Diagnosis present

## 2012-09-07 HISTORY — DX: Cerebral infarction, unspecified: I63.9

## 2012-09-07 HISTORY — DX: Chronic kidney disease, stage 4 (severe): N18.4

## 2012-09-07 LAB — GLUCOSE, CAPILLARY: Glucose-Capillary: 168 mg/dL — ABNORMAL HIGH (ref 70–99)

## 2012-09-07 LAB — COMPREHENSIVE METABOLIC PANEL
ALT: 11 U/L (ref 0–53)
AST: 17 U/L (ref 0–37)
Albumin: 3.3 g/dL — ABNORMAL LOW (ref 3.5–5.2)
Alkaline Phosphatase: 80 U/L (ref 39–117)
BUN: 31 mg/dL — ABNORMAL HIGH (ref 6–23)
CO2: 23 mEq/L (ref 19–32)
Calcium: 9 mg/dL (ref 8.4–10.5)
Chloride: 106 mEq/L (ref 96–112)
Creatinine, Ser: 2.23 mg/dL — ABNORMAL HIGH (ref 0.50–1.35)
GFR calc Af Amer: 31 mL/min — ABNORMAL LOW (ref >90)
GFR calc non Af Amer: 26 mL/min — ABNORMAL LOW (ref >90)
Glucose, Bld: 195 mg/dL — ABNORMAL HIGH (ref 70–99)
Potassium: 4.5 mEq/L (ref 3.5–5.1)
Sodium: 136 mEq/L (ref 135–145)
Total Bilirubin: 0.2 mg/dL — ABNORMAL LOW (ref 0.3–1.2)
Total Protein: 6.7 g/dL (ref 6.0–8.3)

## 2012-09-07 LAB — CBC
HCT: 29.6 % — ABNORMAL LOW (ref 39.0–52.0)
Hemoglobin: 9.8 g/dL — ABNORMAL LOW (ref 13.0–17.0)
MCH: 31.7 pg (ref 26.0–34.0)
MCHC: 33.1 g/dL (ref 30.0–36.0)
MCV: 95.8 fL (ref 78.0–100.0)
Platelets: 209 10*3/uL (ref 150–400)
RBC: 3.09 MIL/uL — ABNORMAL LOW (ref 4.22–5.81)
RDW: 13 % (ref 11.5–15.5)
WBC: 7.1 10*3/uL (ref 4.0–10.5)

## 2012-09-07 LAB — DIFFERENTIAL
Basophils Absolute: 0 10*3/uL (ref 0.0–0.1)
Basophils Relative: 0 % (ref 0–1)
Eosinophils Absolute: 0.3 10*3/uL (ref 0.0–0.7)
Eosinophils Relative: 5 % (ref 0–5)
Lymphocytes Relative: 24 % (ref 12–46)
Lymphs Abs: 1.7 10*3/uL (ref 0.7–4.0)
Monocytes Absolute: 0.6 10*3/uL (ref 0.1–1.0)
Monocytes Relative: 8 % (ref 3–12)
Neutro Abs: 4.5 10*3/uL (ref 1.7–7.7)
Neutrophils Relative %: 63 % (ref 43–77)

## 2012-09-07 LAB — TROPONIN I: Troponin I: <0.3 ng/mL (ref <0.30)

## 2012-09-07 LAB — APTT: aPTT: 30 seconds (ref 24–37)

## 2012-09-07 LAB — PROTIME-INR
INR: 0.99 (ref 0.00–1.49)
Prothrombin Time: 13 seconds (ref 11.6–15.2)

## 2012-09-07 MED ORDER — ACETAMINOPHEN 325 MG PO TABS: 650.0000 mg | ORAL_TABLET | ORAL | Status: DC | PRN

## 2012-09-07 MED ORDER — HYDRALAZINE HCL 25 MG PO TABS
50.0000 mg | ORAL_TABLET | Freq: Three times a day (TID) | ORAL | Status: DC
  Administered 2012-09-07 – 2012-09-08 (×3): 50 mg via ORAL
  Filled 2012-09-07 (×3): qty 2

## 2012-09-07 MED ORDER — INFLUENZA VIRUS VACC SPLIT PF IM SUSP
0.5000 mL | INTRAMUSCULAR | Status: AC
  Administered 2012-09-08: 0.5 mL via INTRAMUSCULAR
  Filled 2012-09-07: qty 0.5

## 2012-09-07 MED ORDER — CLOPIDOGREL BISULFATE 75 MG PO TABS
75.0000 mg | ORAL_TABLET | Freq: Every day | ORAL | Status: DC
  Administered 2012-09-08: 75 mg via ORAL
  Filled 2012-09-07: qty 1

## 2012-09-07 MED ORDER — LINAGLIPTIN 5 MG PO TABS
5.0000 mg | ORAL_TABLET | Freq: Every day | ORAL | Status: DC
  Administered 2012-09-07 – 2012-09-08 (×2): 5 mg via ORAL
  Filled 2012-09-07 (×2): qty 1

## 2012-09-07 MED ORDER — AMLODIPINE BESYLATE 5 MG PO TABS
10.0000 mg | ORAL_TABLET | Freq: Every day | ORAL | Status: DC
  Administered 2012-09-07 – 2012-09-08 (×2): 10 mg via ORAL
  Filled 2012-09-07 (×2): qty 2

## 2012-09-07 MED ORDER — ONDANSETRON HCL 4 MG/2ML IJ SOLN: 4.0000 mg | Freq: Four times a day (QID) | INTRAMUSCULAR | Status: DC | PRN

## 2012-09-07 MED ORDER — ONDANSETRON HCL 4 MG/2ML IJ SOLN: 4.0000 mg | Freq: Three times a day (TID) | INTRAMUSCULAR | Status: DC | PRN

## 2012-09-07 MED ORDER — LEVETIRACETAM 500 MG PO TABS
500.0000 mg | ORAL_TABLET | Freq: Two times a day (BID) | ORAL | Status: DC
  Administered 2012-09-07 – 2012-09-08 (×2): 500 mg via ORAL
  Filled 2012-09-07 (×2): qty 1

## 2012-09-07 MED ORDER — INSULIN ASPART 100 UNIT/ML ~~LOC~~ SOLN: 0.0000 [IU] | Freq: Every day | SUBCUTANEOUS | Status: DC

## 2012-09-07 MED ORDER — ACETAMINOPHEN 650 MG RE SUPP: 650.0000 mg | RECTAL | Status: DC | PRN

## 2012-09-07 MED ORDER — SENNOSIDES-DOCUSATE SODIUM 8.6-50 MG PO TABS: 1.0000 | ORAL_TABLET | Freq: Every evening | ORAL | Status: DC | PRN

## 2012-09-07 MED ORDER — LEVOTHYROXINE SODIUM 150 MCG PO CAPS: 150.0000 ug | ORAL_CAPSULE | Freq: Every day | ORAL | Status: DC

## 2012-09-07 MED ORDER — LEVOTHYROXINE SODIUM 75 MCG PO TABS
150.0000 ug | ORAL_TABLET | Freq: Every day | ORAL | Status: DC
Start: 2012-09-08 — End: 2012-09-08
  Administered 2012-09-08: 150 ug via ORAL
  Filled 2012-09-07 (×2): qty 1

## 2012-09-07 MED ORDER — INSULIN ASPART 100 UNIT/ML ~~LOC~~ SOLN
0.0000 [IU] | Freq: Three times a day (TID) | SUBCUTANEOUS | Status: DC
  Administered 2012-09-08: 3 [IU] via SUBCUTANEOUS
  Administered 2012-09-08 (×2): 2 [IU] via SUBCUTANEOUS

## 2012-09-07 MED ORDER — ENOXAPARIN SODIUM 30 MG/0.3ML ~~LOC~~ SOLN
30.0000 mg | SUBCUTANEOUS | Status: DC
  Administered 2012-09-07: 30 mg via SUBCUTANEOUS
  Filled 2012-09-07: qty 0.3

## 2012-09-07 NOTE — H&P (Signed)
[facility] History and Physical  Chase [name] [id number] DOB: 01-Jul-1931 DOA: 09/07/2012  Referring physician: Dr. Roxanne Mins PCP: Tula Nakayama, MD  Specialists:   Chief Complaint: right arm numbness and weakness  HPI: Chase [name] is a 78 y.o. male with a history of hypertension, diabetes, chronic kidney disease stage IV, seizure disorder, prior stroke. Patient presents to the emergency room today with complaints of right arm numbness and weakness. He reports waking up with the symptoms. Last night he was feeling like his normal self. He denies any recent changes in vision, no difficulty with his speech. He reports some right-sided weakness in his lower extremity as well but feels that this may be chronic. He denies any chest pain, shortness of breath, nausea vomiting, diarrhea. Patient has a history of stroke and has been on aspirin. He reports being compliant with this but admits to occasionally missing a dose. He is also feeling generally tired and weak. He says he also feels very cold. He does also have a history of uncontrolled hypothyroidism, but again now reports compliance with medications. He also feels that the [place] has made him increasingly fatigued. He was evaluated in the emergency room where MRI of the brain showed acute stroke. Patient has been admitted for further evaluation and treatment.  Review of Systems: The patient denies anorexia, fever, weight loss,, vision loss, decreased hearing, hoarseness, chest pain, syncope, dyspnea on exertion, peripheral edema, balance deficits, hemoptysis, abdominal pain, melena, hematochezia, severe indigestion/heartburn, hematuria, incontinence, genital sores, suspicious skin lesions, transient blindness, difficulty walking, depression, unusual weight change, abnormal bleeding, enlarged lymph nodes, angioedema, and breast masses.    Past Medical History  Diagnosis Date  . Diabetes mellitus   . Hypothyroidism   . Hypertension   .  Peripheral vascular disease, unspecified   . Unspecified hypothyroidism   . Depressive disorder, not elsewhere classified   . Obesity   . Other and unspecified hyperlipidemia   . Unspecified essential hypertension   . Pain in joint, lower leg   . Osteoarthrosis, unspecified whether generalized or localized, lower leg   . Derangement of meniscus, not elsewhere classified   . Lumbago   . Spondylosis of unspecified site without mention of myelopathy   . Spinal stenosis, unspecified region other than cervical   . Backache, unspecified   . Family history of diabetes mellitus   . CKD (chronic kidney disease) stage 3, GFR 30-59 ml/min   . Seizures   . Complete lesion of cervical spinal cord 03/14/3012    Stable since 2006  . Lacunar stroke, acute 03/14/2012  . Bradycardia 03/15/2012  . Gait disorder   . Knee contracture   . Diabetic neuropathy   . CKD (chronic kidney disease) stage 4, GFR 15-29 ml/min   . CVA (cerebrovascular accident) 09/07/12   Past Surgical History  Procedure Date  . Kidney stones left x2 1975  . Kidney surgery    Social History:  reports that he has never smoked. He does not have any smokeless tobacco history on file. He reports that he does not drink alcohol or use illicit drugs. Patient lives at home with his wife and is independent with ADLs  Allergies  Allergen Reactions  . Bayer Advanced Aspirin (Aspirin) Nausea And Vomiting  . Penicillins Nausea And Vomiting    Family History  Problem Relation Age of Onset  . Diabetes Mother   . Prostate cancer Father   . Diabetes Sister   . Diabetes Brother   . Diabetes Brother   .  Hypertension Brother   . Hypertension Brother   . Hypertension Brother     Prior to Admission medications   Medication Sig Start Date End Date Taking? Authorizing Provider  amLODipine (NORVASC) 10 MG tablet Take 10 mg by mouth daily. BLOOD PRESSURE 02/04/12 02/03/13 Yes [name], MD  aspirin (ASPIRIN LOW DOSE) 81 MG EC tablet Take  81 mg by mouth daily.    Yes Historical Provider, MD  insulin lispro protamine-insulin lispro (HUMALOG 75/25) (75-25) 100 UNIT/ML SUSP Inject 15 Units into the skin 2 (two) times daily with a meal.   Yes Historical Provider, MD  levETIRAcetam (KEPPRA) 500 MG tablet Take 500 mg by mouth 2 (two) times daily. FOR SEIZURE 03/05/12 03/05/13 Yes Hoy Morn, MD  Levothyroxine Sodium 150 MCG CAPS Take 150 mcg by mouth daily. FOR THYROID 03/07/12  Yes [name], MD  linagliptin (TRADJENTA) 5 MG TABS tablet Take 5 mg by mouth daily.   Yes Historical Provider, MD  losartan (COZAAR) 100 MG tablet Take 100 mg by mouth daily. FOR HEART/BLOOD PRESSURE 03/07/12 03/07/13 Yes [name], MD   Physical Exam: Filed Vitals:   09/07/12 1407 09/07/12 1700  BP: 149/71 159/82  Pulse: 87 70  Temp: 98.1 F (36.7 C) 97.9 F (36.6 C)  TempSrc: Oral Oral  Resp: 18 18  Height:  5\' 5"  (1.651 m)  Weight:  91 kg (200 lb 9.9 oz)  SpO2: 100% 100%     General:  No acute distress  Eyes: Pupils are equal round react to light, extraocular motions are intact  ENT: Mucous membranes are moist, no pharyngeal erythema  Neck: Supple  Cardiovascular: S1, S2, regular rate and rhythm, 1-2+ edema in lower extremities bilaterally  Respiratory: Clear to auscultation bilaterally  Abdomen: Soft, nontender, nondistended, bowel sounds are active  Skin: Deferred  Musculoskeletal: Deferred  Psychiatric: Normal affect, cooperative with exam  Neurologic: Strength is 5 out of 5 in the lower extremities, 5 out of 5 in the left upper extremity, 4-5 out of 5 in the right upper extremity, cranial nerves appear to be intact  Labs on Admission:  Basic Metabolic Panel:  Lab [id number] 1425  NA 136  K 4.5  CL 106  CO2 23  GLUCOSE 195*  BUN 31*  CREATININE 2.23*  CALCIUM 9.0  MG --  PHOS --   Liver Function Tests:  Lab 09/07/12 1425  AST 17  ALT 11  ALKPHOS 80  BILITOT 0.2*  PROT 6.7  ALBUMIN 3.3*   No  results found for this basename: LIPASE:5,AMYLASE:5 in the last 168 hours No results found for this basename: AMMONIA:5 in the last 168 hours CBC:  Lab 09/07/12 1425  WBC 7.1  NEUTROABS 4.5  HGB 9.8*  HCT 29.6*  MCV 95.8  PLT 209   Cardiac Enzymes:  Lab 09/07/12 1425  CKTOTAL --  CKMB --  CKMBINDEX --  TROPONINI <0.30    BNP (last 3 results) No results found for this basename: PROBNP:3 in the last 8760 hours CBG: No results found for this basename: GLUCAP:5 in the last 168 hours  Radiological Exams on Admission: Dg Chest 2 View  09/07/2012  *RADIOLOGY REPORT*  Clinical Data: Weakness, numbness.  CHEST - 2 VIEW  Comparison: 08/10/2012  Findings: Heart is borderline in size.  Lungs are clear.  No effusions.  No acute bony abnormality.  IMPRESSION: No active cardiopulmonary disease.   Original Report Authenticated By: Raelyn Number, M.D.    Ct Head Wo  Contrast  09/07/2012  *RADIOLOGY REPORT*  Clinical Data: Numbness.  CT HEAD WITHOUT CONTRAST  Technique:  Contiguous axial images were obtained from the base of the skull through the vertex without contrast.  Comparison: 03/05/2012  Findings: There is atrophy and chronic small vessel disease changes. No acute intracranial abnormality.  Specifically, no hemorrhage, hydrocephalus, mass lesion, acute infarction, or significant intracranial injury.  No acute calvarial abnormality. Old right middle ganglia lacunar infarct, stable.  Visualized paranasal sinuses and mastoids clear.  Orbital soft tissues unremarkable.  IMPRESSION: No acute intracranial abnormality.  Atrophy, chronic microvascular disease.   Original Report Authenticated By: Raelyn Number, M.D.    Mr Brain Wo Contrast  09/07/2012  *RADIOLOGY REPORT*  Clinical Data: 78 year old male with weakness, altered mental status, bilateral arm numbness.  MRI HEAD WITHOUT CONTRAST  Technique:  Multiplanar, multiecho pulse sequences of the brain and surrounding structures were obtained  according to standard protocol without intravenous contrast.  Comparison: 03/14/2012 and earlier.  Findings: Patchy cortical and subcortical white matter restricted diffusion in the left hemisphere at the level of the left motor strip, pre motor area, and scattered involvement in the left parietal lobe including the sensory strip.  No associated hemorrhage.  Patchy associated T2 and FLAIR hyperintensity.  No mass effect.  No contralateral or posterior fossa restricted diffusion. Major intracranial vascular flow voids are stable.  Stable ventricle size and configuration.  No midline shift, mass effect, or evidence of mass lesion.  Chronic confluent periventricular white matter T2 and FLAIR hyperintensity superimposed on the above findings is stable.  Chronic right basal ganglia lacunar infarct.  Interval small left thalamic lacunar infarcts.  Stable brain stem and cerebellum.  Negative visualized cervical spine, cervicomedullary junction and pituitary.  Visualized orbit soft tissues are within normal limits.  Stable paranasal sinuses and mastoids.  Normal marrow signal.  Negative scalp soft tissues.  IMPRESSION: 1.  Patchy acute infarcts in the posterior division left MCA territory.  No mass effect or hemorrhage. 2.  Underlying chronic small vessel ischemia with other progression since April.   Original Report Authenticated By: Randall An, M.D.     EKG: Independently reviewed. Sinus rhythm without any acute ST or T changes  Assessment/Plan Principal Problem:  *CVA (cerebral vascular accident) Active Problems:  HYPOTHYROIDISM  HYPERTENSION  DM (diabetes mellitus) type II uncontrolled with renal manifestation  CKD (chronic kidney disease) stage 4, GFR 15-29 ml/min  Anemia   1. Acute CVA. Patient has a CVA that has been confirmed on MRI. He reports being compliant with aspirin. We will change to Plavix. Complete the remainder of stroke workup. We'll ask for neurology input regarding antiplatelet  agents and seizure medications. 2. Uncontrolled hypothyroidism. Patient's TSH was elevated when last checked a few months ago. This could also explain some of his symptoms. We will recheck a level. 3. Diabetes. Continue sliding scale insulin 4. Chronic kidney disease stage IV, creatinine appears to be at baseline 5. Pedal edema. Patient describes as chronic. Will encourage to keep his legs elevated 6. Anemia. Likely secondary to chronic kidney disease. Continue to follow 7. Seizure disorder. Continue Keppra for now. Patient is feeling fatigue which may be related to [place]. We'll ask neurology to comment. 8. Right upper extremity edema. We'll check Dopplers rule out DVT  Code Status: Full code Family Communication: Discussed with patient, wife, multiple children at the bedside Disposition Plan: Patient will likely discharge home once workup is complete. I anticipate that his length of stay will be  less than 2 midnights and therefore I will admit him to my service under observation in the telemetry unit  Time spent: 3mins  Penina Reisner [facility] Pager [phone]  If 7PM-7AM, please contact night-coverage [email or web address] Password [facility] 09/07/2012, 5:26 PM

## 2012-09-07 NOTE — ED Notes (Signed)
Pt states right side numbness since waking up ~ 0600.

## 2012-09-07 NOTE — ED Provider Notes (Signed)
History     CSN: [id number]  Arrival date & time 09/07/12  1356   First MD Initiated Contact with Patient 09/07/12 1403      Chief Complaint  Patient presents with  . Numbness    (Consider location/radiation/quality/duration/timing/severity/associated sxs/prior treatment) The history is provided by the patient and the spouse.   78 year old male complains of numbness of his right arm and inability to use his right arm. This was noticed by his wife when she got him up to eat breakfast. He was last known to be neurologically intact when he went to bed last night which was at about 11 PM. He denies difficulty speaking and his wife states that his speech is at his baseline. He denies headache or chest pain. He denies nausea or vomiting. Symptoms are moderate. Nothing makes it better nothing makes it worse.  Past Medical History  Diagnosis Date  . Diabetes mellitus   . Hypothyroidism   . Hypertension   . Peripheral vascular disease, unspecified   . Unspecified hypothyroidism   . Depressive disorder, not elsewhere classified   . Obesity   . Other and unspecified hyperlipidemia   . Unspecified essential hypertension   . Pain in joint, lower leg   . Osteoarthrosis, unspecified whether generalized or localized, lower leg   . Derangement of meniscus, not elsewhere classified   . Lumbago   . Spondylosis of unspecified site without mention of myelopathy   . Spinal stenosis, unspecified region other than cervical   . Backache, unspecified   . Family history of diabetes mellitus   . CKD (chronic kidney disease) stage 3, GFR 30-59 ml/min   . Seizures   . Complete lesion of cervical spinal cord 03/14/3012    Stable since 2006  . Lacunar stroke, acute 03/14/2012  . Bradycardia 03/15/2012  . Gait disorder   . Knee contracture   . Diabetic neuropathy     Past Surgical History  Procedure Date  . Kidney stones left x2 1975  . Kidney surgery     Family History  Problem Relation Age of Onset   . Diabetes Mother   . Prostate cancer Father   . Diabetes Sister   . Diabetes Brother   . Diabetes Brother   . Hypertension Brother   . Hypertension Brother   . Hypertension Brother     History  Substance Use Topics  . Smoking status: Never Smoker   . Smokeless tobacco: Not on file  . Alcohol Use: No      Review of Systems  All other systems reviewed and are negative.    Allergies  Bayer advanced aspirin and Penicillins  Home Medications   Current Outpatient Rx  Name Route Sig Dispense Refill  . AMLODIPINE BESYLATE 10 MG PO TABS Oral Take 10 mg by mouth daily. BLOOD PRESSURE    . ASPIRIN 81 MG PO TBEC Oral Take 81 mg by mouth daily. FOR HEART (BLOOD THINNER)    . BISACODYL 5 MG PO TBEC Oral Take 1 tablet (5 mg total) by mouth daily as needed for constipation. 14 tablet 0  . FLUOXETINE HCL 10 MG PO CAPS Oral Take 1 capsule by mouth Daily.    . INSULIN LISPRO PROT & LISPRO (75-25) 100 UNIT/ML [place] SUSP Subcutaneous Inject 15 Units into the skin 2 (two) times daily with a meal.    . LEVETIRACETAM 500 MG PO TABS Oral Take 500 mg by mouth 2 (two) times daily. FOR SEIZURE    . LEVOTHYROXINE SODIUM 150  MCG PO CAPS Oral Take 150 mcg by mouth daily. FOR THYROID    . LOSARTAN POTASSIUM 100 MG PO TABS Oral Take 100 mg by mouth daily. FOR HEART/BLOOD PRESSURE      BP 149/71  Pulse 87  Temp 98.1 F (36.7 C) (Oral)  Resp 18  SpO2 100%  Physical Exam  Nursing note and vitals reviewed. 78 year old male, resting comfortably and in no acute distress. Vital signs are significant for mild hypertension with blood pressure 149/71. Oxygen saturation is 100%, which is normal. Head is normocephalic and atraumatic. PERRLA, EOMI. pupils are 3 mm. Fundi show no hemorrhage, exudate, or papilledema. Arcus senilis is present. Oropharynx is clear. No facial droop is seen. Neck is nontender and supple without adenopathy or JVD. There are no carotid bruits. Back is nontender and there is no CVA  tenderness. Lungs are clear without rales, wheezes, or rhonchi. Chest is nontender. Heart has regular rate and rhythm without murmur. Abdomen is soft, flat, nontender without masses or hepatosplenomegaly and peristalsis is normoactive. Extremities have no cyanosis, full range of motion is present. There is 2+ pitting edema of his legs. Right hand has 1-2+ edema and right forearm circumference is about 1 cm greater than left forearm circumference. There is no tenderness to palpation. Skin is warm and dry without rash. Neurologic: He is awake, alert, oriented. Speech is slightly dysarthric, but wife states that it is his normal speech. Cranial nerves are intact. There are no objective sensory deficits. There is moderate weakness of the right arm with strength 4/5, and mild weakness of the right leg with strength 4+/5. Pronator drift is present on the right.   ED Course  Procedures (including critical care time)  Results for orders placed during the hospital encounter of 09/07/12  PROTIME-INR      Component Value Range   Prothrombin Time 13.0  11.6 - 15.2 seconds   INR 0.99  0.00 - 1.49  APTT      Component Value Range   aPTT 30  24 - 37 seconds  CBC      Component Value Range   WBC 7.1  4.0 - 10.5 K/uL   RBC 3.09 (*) 4.22 - 5.81 MIL/uL   Hemoglobin 9.8 (*) 13.0 - 17.0 g/dL   HCT 29.6 (*) 39.0 - 52.0 %   MCV 95.8  78.0 - 100.0 fL   MCH 31.7  26.0 - 34.0 pg   MCHC 33.1  30.0 - 36.0 g/dL   RDW 13.0  11.5 - 15.5 %   Platelets 209  150 - 400 K/uL  DIFFERENTIAL      Component Value Range   Neutrophils Relative 63  43 - 77 %   Neutro Abs 4.5  1.7 - 7.7 K/uL   Lymphocytes Relative 24  12 - 46 %   Lymphs Abs 1.7  0.7 - 4.0 K/uL   Monocytes Relative 8  3 - 12 %   Monocytes Absolute 0.6  0.1 - 1.0 K/uL   Eosinophils Relative 5  0 - 5 %   Eosinophils Absolute 0.3  0.0 - 0.7 K/uL   Basophils Relative 0  0 - 1 %   Basophils Absolute 0.0  0.0 - 0.1 K/uL  COMPREHENSIVE METABOLIC PANEL       Component Value Range   Sodium 136  135 - 145 mEq/L   Potassium 4.5  3.5 - 5.1 mEq/L   Chloride 106  96 - 112 mEq/L   CO2 23  19 -  32 mEq/L   Glucose, Bld 195 (*) 70 - 99 mg/dL   BUN 31 (*) 6 - 23 mg/dL   Creatinine, Ser 2.23 (*) 0.50 - 1.35 mg/dL   Calcium 9.0  8.4 - 10.5 mg/dL   Total Protein 6.7  6.0 - 8.3 g/dL   Albumin 3.3 (*) 3.5 - 5.2 g/dL   AST 17  0 - 37 U/L   ALT 11  0 - 53 U/L   Alkaline Phosphatase 80  39 - 117 U/L   Total Bilirubin 0.2 (*) 0.3 - 1.2 mg/dL   GFR calc non Af Amer 26 (*) >90 mL/min   GFR calc Af Amer 31 (*) >90 mL/min  TROPONIN I      Component Value Range   Troponin I <0.30  <0.30 ng/mL   Ct Abdomen Pelvis Wo Contrast  08/10/2012  *RADIOLOGY REPORT*  Clinical Data: Abdominal pain and nausea.  CT ABDOMEN AND PELVIS WITHOUT CONTRAST  Technique:  Multidetector CT imaging of the abdomen and pelvis was performed following the standard protocol without intravenous contrast.  Comparison: Lumbar spine MRI 01/02/2008. No similar prior study is available for comparison.  Findings: Dependent bilateral lower lobe curvilinear scarring or atelectasis noted.  Mild cardiomegaly noted with trace pericardial fluid.  Left renal cortical thinning noted with malrotation, parapelvic cysts, and prominent probable extrarenal pelvis.  Distal ureteral decompression noted.  No radiopaque renal or ureteral calculus. Gallstones noted without other secondary CT evidence for cholecystitis.  Unenhanced liver demonstrates evidence of probable prior granulomatous disease.  Spleen, right kidney, adrenal glands, pancreas are unremarkable.  The bladder is normal.  Prostatic calcifications are noted. Scattered colonic diverticuli noted without evidence for diverticulitis.  No bowel wall thickening or focal segmental dilatation.  No pelvic free fluid or lymphadenopathy.  The appendix is normal.  Lumbar spine degenerative change again noted.  No acute osseous abnormality.  IMPRESSION: No acute  intra-abdominal or pelvic pathology.  Gallstones without other CT evidence for acute cholecystitis. Functionality could be better determined with hepatobiliary nuclear medicine scan if indicated.   Original Report Authenticated By: Arline Asp, M.D.    Dg Chest 2 View  09/07/2012  *RADIOLOGY REPORT*  Clinical Data: Weakness, numbness.  CHEST - 2 VIEW  Comparison: 08/10/2012  Findings: Heart is borderline in size.  Lungs are clear.  No effusions.  No acute bony abnormality.  IMPRESSION: No active cardiopulmonary disease.   Original Report Authenticated By: Raelyn Number, M.D.    Ct Head Wo Contrast  09/07/2012  *RADIOLOGY REPORT*  Clinical Data: Numbness.  CT HEAD WITHOUT CONTRAST  Technique:  Contiguous axial images were obtained from the base of the skull through the vertex without contrast.  Comparison: 03/05/2012  Findings: There is atrophy and chronic small vessel disease changes. No acute intracranial abnormality.  Specifically, no hemorrhage, hydrocephalus, mass lesion, acute infarction, or significant intracranial injury.  No acute calvarial abnormality. Old right middle ganglia lacunar infarct, stable.  Visualized paranasal sinuses and mastoids clear.  Orbital soft tissues unremarkable.  IMPRESSION: No acute intracranial abnormality.  Atrophy, chronic microvascular disease.   Original Report Authenticated By: Raelyn Number, M.D.    Dg Chest Port 1 View  08/10/2012  *RADIOLOGY REPORT*  Clinical Data: Chest pain.  High blood pressure.  PORTABLE CHEST - 1 VIEW  Comparison: 03/14/2012.  Findings: Cardiomegaly.  Mild central pulmonary vascular prominence.  Tortuous aorta.  Basilar subsegmental atelectasis.  IMPRESSION: Cardiomegaly.  Mild central pulmonary vascular prominence.  Tortuous aorta.  Basilar subsegmental  atelectasis.   Original Report Authenticated By: Doug Sou, M.D.       Date: 09/07/2012  Rate: 73  Rhythm: normal sinus rhythm  QRS Axis: left  Intervals: normal  ST/T  Wave abnormalities: normal  Conduction Disutrbances:left anterior fascicular block  Narrative Interpretation: Left ventricular hypertrophy. Left anterior fascicular block. No prior ECG available for comparison.  Old EKG Reviewed: none available    1. Stroke   2. Renal insufficiency   3. Anemia    CRITICAL CARE Performed by: [id number]   Total critical care time: 35 minutes  Critical care time was exclusive of separately billable procedures and treating other patients.  Critical care was necessary to treat or prevent imminent or life-threatening deterioration.  Critical care was time spent personally by me on the following activities: development of treatment plan with patient and/or surrogate as well as nursing, discussions with consultants, evaluation of patient's response to treatment, examination of patient, obtaining history from patient or surrogate, ordering and performing treatments and interventions, ordering and review of laboratory studies, ordering and review of radiographic studies, pulse oximetry and re-evaluation of patient's condition.  MDM  Probable stroke in the left cerebral cortex. Arm swelling is asymmetric, so venous Doppler will be obtained of the right upper chest remedies. Prior records are reviewed and he did have a lacunar stroke in March of this year. He is well outside the window for thrombolytic therapy with last known normal time being approximately 15 hours ago.  Workup is significant only for a modest drop in hemoglobin. Renal insufficiency is unchanged. Case is discussed with Dr. Roderic Palau of [facility] who agrees to admit the patient.     Delora Fuel, MD [id number]

## 2012-09-08 ENCOUNTER — Observation Stay (HOSPITAL_COMMUNITY): Payer: Medicare Other

## 2012-09-08 DIAGNOSIS — E1129 Type 2 diabetes mellitus with other diabetic kidney complication: Secondary | ICD-10-CM | POA: Diagnosis not present

## 2012-09-08 DIAGNOSIS — R569 Unspecified convulsions: Secondary | ICD-10-CM | POA: Diagnosis not present

## 2012-09-08 DIAGNOSIS — I6350 Cerebral infarction due to unspecified occlusion or stenosis of unspecified cerebral artery: Secondary | ICD-10-CM | POA: Diagnosis not present

## 2012-09-08 DIAGNOSIS — I6340 Cerebral infarction due to embolism of unspecified cerebral artery: Secondary | ICD-10-CM | POA: Diagnosis not present

## 2012-09-08 DIAGNOSIS — R5383 Other fatigue: Secondary | ICD-10-CM | POA: Diagnosis not present

## 2012-09-08 DIAGNOSIS — M7989 Other specified soft tissue disorders: Secondary | ICD-10-CM | POA: Diagnosis not present

## 2012-09-08 DIAGNOSIS — R5381 Other malaise: Secondary | ICD-10-CM | POA: Diagnosis not present

## 2012-09-08 DIAGNOSIS — G40209 Localization-related (focal) (partial) symptomatic epilepsy and epileptic syndromes with complex partial seizures, not intractable, without status epilepticus: Secondary | ICD-10-CM

## 2012-09-08 DIAGNOSIS — I517 Cardiomegaly: Secondary | ICD-10-CM | POA: Diagnosis not present

## 2012-09-08 DIAGNOSIS — I669 Occlusion and stenosis of unspecified cerebral artery: Secondary | ICD-10-CM | POA: Diagnosis not present

## 2012-09-08 DIAGNOSIS — E1165 Type 2 diabetes mellitus with hyperglycemia: Secondary | ICD-10-CM | POA: Diagnosis not present

## 2012-09-08 DIAGNOSIS — N184 Chronic kidney disease, stage 4 (severe): Secondary | ICD-10-CM | POA: Diagnosis not present

## 2012-09-08 LAB — GLUCOSE, CAPILLARY
Glucose-Capillary: 146 mg/dL — ABNORMAL HIGH (ref 70–99)
Glucose-Capillary: 170 mg/dL — ABNORMAL HIGH (ref 70–99)
Glucose-Capillary: 131 mg/dL — ABNORMAL HIGH (ref 70–99)

## 2012-09-08 LAB — URINALYSIS, ROUTINE W REFLEX MICROSCOPIC
Bilirubin Urine: NEGATIVE
Glucose, UA: NEGATIVE mg/dL
Hgb urine dipstick: NEGATIVE
Ketones, ur: NEGATIVE mg/dL
Leukocytes, UA: NEGATIVE
Nitrite: NEGATIVE
Protein, ur: 30 mg/dL — AB
Specific Gravity, Urine: 1.02 (ref 1.005–1.030)
Urobilinogen, UA: 0.2 mg/dL (ref 0.0–1.0)
pH: 6 (ref 5.0–8.0)

## 2012-09-08 LAB — RAPID URINE DRUG SCREEN, HOSP PERFORMED
Amphetamines: NOT DETECTED
Barbiturates: NOT DETECTED
Benzodiazepines: NOT DETECTED
Cocaine: NOT DETECTED
Opiates: NOT DETECTED
Tetrahydrocannabinol: NOT DETECTED

## 2012-09-08 LAB — LIPID PANEL
Cholesterol: 182 mg/dL (ref 0–200)
HDL: 69 mg/dL (ref >39)
LDL Cholesterol: 100 mg/dL — ABNORMAL HIGH (ref 0–99)
Total CHOL/HDL Ratio: 2.6 RATIO
Triglycerides: 66 mg/dL (ref <150)
VLDL: 13 mg/dL (ref 0–40)

## 2012-09-08 LAB — URINE MICROSCOPIC-ADD ON

## 2012-09-08 LAB — HEMOGLOBIN A1C
Hgb A1c MFr Bld: 8.4 % — ABNORMAL HIGH (ref <5.7)
Mean Plasma Glucose: 194 mg/dL — ABNORMAL HIGH (ref <117)

## 2012-09-08 LAB — TSH: TSH: 5.812 u[IU]/mL — ABNORMAL HIGH (ref 0.350–4.500)

## 2012-09-08 MED ORDER — CLOPIDOGREL BISULFATE 75 MG PO TABS: 75.0000 mg | ORAL_TABLET | Freq: Every day | ORAL | Status: DC

## 2012-09-08 MED ORDER — SIMVASTATIN 20 MG PO TABS: 20.0000 mg | ORAL_TABLET | Freq: Every evening | ORAL | Status: DC

## 2012-09-08 MED ORDER — STROKE: EARLY STAGES OF RECOVERY BOOK
Freq: Once | Status: AC
  Administered 2012-09-08: 10:00:00
  Filled 2012-09-08: qty 1

## 2012-09-08 MED ORDER — FOLIC ACID 1 MG PO TABS
1.0000 mg | ORAL_TABLET | Freq: Every day | ORAL | Status: DC
  Administered 2012-09-08: 1 mg via ORAL
  Filled 2012-09-08: qty 1

## 2012-09-08 MED ORDER — HYDRALAZINE HCL 50 MG PO TABS: 25.0000 mg | ORAL_TABLET | Freq: Three times a day (TID) | ORAL | Status: DC

## 2012-09-08 NOTE — Consult Note (Signed)
Chase A. Merlene Laughter, MD     [email or web address]        Reason for Consult: Referring Physician:   [name] is an 78 y.o. male.  HPI  Past Medical History  Diagnosis Date  . Diabetes mellitus   . Hypothyroidism   . Hypertension   . Peripheral vascular disease, unspecified   . Unspecified hypothyroidism   . Depressive disorder, not elsewhere classified   . Obesity   . Other and unspecified hyperlipidemia   . Unspecified essential hypertension   . Pain in joint, lower leg   . Osteoarthrosis, unspecified whether generalized or localized, lower leg   . Derangement of meniscus, not elsewhere classified   . Lumbago   . Spondylosis of unspecified site without mention of myelopathy   . Spinal stenosis, unspecified region other than cervical   . Backache, unspecified   . Family history of diabetes mellitus   . CKD (chronic kidney disease) stage 3, GFR 30-59 ml/min   . Seizures   . Complete lesion of cervical spinal cord 03/14/3012    Stable since 2006  . Lacunar stroke, acute 03/14/2012  . Bradycardia 03/15/2012  . Gait disorder   . Knee contracture   . Diabetic neuropathy   . CKD (chronic kidney disease) stage 4, GFR 15-29 ml/min   . CVA (cerebrovascular accident) 09/07/12    Past Surgical History  Procedure Date  . Kidney stones left x2 1975  . Kidney surgery     Family History  Problem Relation Age of Onset  . Diabetes Mother   . Prostate cancer Father   . Diabetes Sister   . Diabetes Brother   . Diabetes Brother   . Hypertension Brother   . Hypertension Brother   . Hypertension Brother     Social History:  reports that he has never smoked. He does not have any smokeless tobacco history on file. He reports that he does not drink alcohol or use illicit drugs.  Allergies:  Allergies  Allergen Reactions  . Bayer Advanced Aspirin (Aspirin) Nausea And Vomiting  . Penicillins Nausea And Vomiting    Medications:  Prior to Admission  medications   Medication Sig Start Date End Date Taking? Authorizing Provider  amLODipine (NORVASC) 10 MG tablet Take 10 mg by mouth daily. BLOOD PRESSURE 02/04/12 02/03/13 Yes [name], MD  aspirin (ASPIRIN LOW DOSE) 81 MG EC tablet Take 81 mg by mouth daily.    Yes Historical Provider, MD  insulin lispro protamine-insulin lispro (HUMALOG 75/25) (75-25) 100 UNIT/ML SUSP Inject 15 Units into the skin 2 (two) times daily with a meal.   Yes Historical Provider, MD  levETIRAcetam (KEPPRA) 500 MG tablet Take 500 mg by mouth 2 (two) times daily. FOR SEIZURE 03/05/12 03/05/13 Yes Hoy Morn, MD  Levothyroxine Sodium 150 MCG CAPS Take 150 mcg by mouth daily. FOR THYROID 03/07/12  Yes [name], MD  linagliptin (TRADJENTA) 5 MG TABS tablet Take 5 mg by mouth daily.   Yes Historical Provider, MD  losartan (COZAAR) 100 MG tablet Take 100 mg by mouth daily. FOR HEART/BLOOD PRESSURE 03/07/12 03/07/13 Yes [name], MD   Scheduled Meds:   . amLODipine  10 mg Oral Daily  . clopidogrel  75 mg Oral Q breakfast  . enoxaparin  30 mg Subcutaneous Q24H  . hydrALAZINE  50 mg Oral Q8H  . influenza  inactive virus vaccine  0.5 mL Intramuscular Tomorrow-1000  . insulin aspart  0-15 Units Subcutaneous TID  WC  . insulin aspart  0-5 Units Subcutaneous QHS  . levETIRAcetam  500 mg Oral BID  . levothyroxine  150 mcg Oral QAC breakfast  . linagliptin  5 mg Oral Daily  . DISCONTD: Levothyroxine Sodium  150 mcg Oral Daily   Continuous Infusions:  PRN Meds:.acetaminophen, acetaminophen, ondansetron (ZOFRAN) IV, senna-docusate, DISCONTD: ondansetron (ZOFRAN) IV 6  Results for orders placed during the hospital encounter of 09/07/12 (from the past 48 hour(s))  PROTIME-INR     Status: Normal   Collection Time   09/07/12  2:25 PM      Component Value Range Comment   Prothrombin Time 13.0  11.6 - 15.2 seconds    INR 0.99  0.00 - 1.49   APTT     Status: Normal   Collection Time   09/07/12  2:25 PM        Component Value Range Comment   aPTT 30  24 - 37 seconds   CBC     Status: Abnormal   Collection Time   09/07/12  2:25 PM      Component Value Range Comment   WBC 7.1  4.0 - 10.5 K/uL    RBC 3.09 (*) 4.22 - 5.81 MIL/uL    Hemoglobin 9.8 (*) 13.0 - 17.0 g/dL    HCT 29.6 (*) 39.0 - 52.0 %    MCV 95.8  78.0 - 100.0 fL    MCH 31.7  26.0 - 34.0 pg    MCHC 33.1  30.0 - 36.0 g/dL    RDW 13.0  11.5 - 15.5 %    Platelets 209  150 - 400 K/uL   DIFFERENTIAL     Status: Normal   Collection Time   09/07/12  2:25 PM      Component Value Range Comment   Neutrophils Relative 63  43 - 77 %    Neutro Abs 4.5  1.7 - 7.7 K/uL    Lymphocytes Relative 24  12 - 46 %    Lymphs Abs 1.7  0.7 - 4.0 K/uL    Monocytes Relative 8  3 - 12 %    Monocytes Absolute 0.6  0.1 - 1.0 K/uL    Eosinophils Relative 5  0 - 5 %    Eosinophils Absolute 0.3  0.0 - 0.7 K/uL    Basophils Relative 0  0 - 1 %    Basophils Absolute 0.0  0.0 - 0.1 K/uL   COMPREHENSIVE METABOLIC PANEL     Status: Abnormal   Collection Time   09/07/12  2:25 PM      Component Value Range Comment   Sodium 136  135 - 145 mEq/L    Potassium 4.5  3.5 - 5.1 mEq/L    Chloride 106  96 - 112 mEq/L    CO2 23  19 - 32 mEq/L    Glucose, Bld 195 (*) 70 - 99 mg/dL    BUN 31 (*) 6 - 23 mg/dL    Creatinine, Ser 2.23 (*) 0.50 - 1.35 mg/dL    Calcium 9.0  8.4 - 10.5 mg/dL    Total Protein 6.7  6.0 - 8.3 g/dL    Albumin 3.3 (*) 3.5 - 5.2 g/dL    AST 17  0 - 37 U/L    ALT 11  0 - 53 U/L    Alkaline Phosphatase 80  39 - 117 U/L    Total Bilirubin 0.2 (*) 0.3 - 1.2 mg/dL    GFR calc non Af Amer 26 (*) >  90 mL/min    GFR calc Af Amer 31 (*) >90 mL/min   TROPONIN I     Status: Normal   Collection Time   09/07/12  2:25 PM      Component Value Range Comment   Troponin I <0.30  <0.30 ng/mL   TSH     Status: Abnormal   Collection Time   09/07/12  2:25 PM      Component Value Range Comment   TSH 5.812 (*) 0.350 - 4.500 uIU/mL   GLUCOSE, CAPILLARY      Status: Abnormal   Collection Time   09/07/12  8:58 PM      Component Value Range Comment   Glucose-Capillary 168 (*) 70 - 99 mg/dL    Comment 1 Notify RN     URINE RAPID DRUG SCREEN (HOSP PERFORMED)     Status: Normal   Collection Time   09/08/12  5:29 AM      Component Value Range Comment   Opiates NONE DETECTED  NONE DETECTED    Cocaine NONE DETECTED  NONE DETECTED    Benzodiazepines NONE DETECTED  NONE DETECTED    Amphetamines NONE DETECTED  NONE DETECTED    Tetrahydrocannabinol NONE DETECTED  NONE DETECTED    Barbiturates NONE DETECTED  NONE DETECTED   URINALYSIS, ROUTINE W REFLEX MICROSCOPIC     Status: Abnormal   Collection Time   09/08/12  5:29 AM      Component Value Range Comment   Color, Urine YELLOW  YELLOW    APPearance CLEAR  CLEAR    Specific Gravity, Urine 1.020  1.005 - 1.030    pH 6.0  5.0 - 8.0    Glucose, UA NEGATIVE  NEGATIVE mg/dL    Hgb urine dipstick NEGATIVE  NEGATIVE    Bilirubin Urine NEGATIVE  NEGATIVE    Ketones, ur NEGATIVE  NEGATIVE mg/dL    Protein, ur 30 (*) NEGATIVE mg/dL    Urobilinogen, UA 0.2  0.0 - 1.0 mg/dL    Nitrite NEGATIVE  NEGATIVE    Leukocytes, UA NEGATIVE  NEGATIVE   URINE MICROSCOPIC-ADD ON     Status: Normal   Collection Time   09/08/12  5:29 AM      Component Value Range Comment   RBC / HPF 0-2  <3 RBC/hpf     Dg Chest 2 View  09/07/2012  *RADIOLOGY REPORT*  Clinical Data: Weakness, numbness.  CHEST - 2 VIEW  Comparison: 08/10/2012  Findings: Heart is borderline in size.  Lungs are clear.  No effusions.  No acute bony abnormality.  IMPRESSION: No active cardiopulmonary disease.   Original Report Authenticated By: Raelyn Number, M.D.    Ct Head Wo Contrast  09/07/2012  *RADIOLOGY REPORT*  Clinical Data: Numbness.  CT HEAD WITHOUT CONTRAST  Technique:  Contiguous axial images were obtained from the base of the skull through the vertex without contrast.  Comparison: 03/05/2012  Findings: There is atrophy and chronic small vessel  disease changes. No acute intracranial abnormality.  Specifically, no hemorrhage, hydrocephalus, mass lesion, acute infarction, or significant intracranial injury.  No acute calvarial abnormality. Old right middle ganglia lacunar infarct, stable.  Visualized paranasal sinuses and mastoids clear.  Orbital soft tissues unremarkable.  IMPRESSION: No acute intracranial abnormality.  Atrophy, chronic microvascular disease.   Original Report Authenticated By: Raelyn Number, M.D.    Mr Brain Wo Contrast  09/07/2012  *RADIOLOGY REPORT*  Clinical Data: 78 year old male with weakness, altered mental status, bilateral arm numbness.  MRI HEAD WITHOUT CONTRAST  Technique:  Multiplanar, multiecho pulse sequences of the brain and surrounding structures were obtained according to standard protocol without intravenous contrast.  Comparison: 03/14/2012 and earlier.  Findings: Patchy cortical and subcortical white matter restricted diffusion in the left hemisphere at the level of the left motor strip, pre motor area, and scattered involvement in the left parietal lobe including the sensory strip.  No associated hemorrhage.  Patchy associated T2 and FLAIR hyperintensity.  No mass effect.  No contralateral or posterior fossa restricted diffusion. Major intracranial vascular flow voids are stable.  Stable ventricle size and configuration.  No midline shift, mass effect, or evidence of mass lesion.  Chronic confluent periventricular white matter T2 and FLAIR hyperintensity superimposed on the above findings is stable.  Chronic right basal ganglia lacunar infarct.  Interval small left thalamic lacunar infarcts.  Stable brain stem and cerebellum.  Negative visualized cervical spine, cervicomedullary junction and pituitary.  Visualized orbit soft tissues are within normal limits.  Stable paranasal sinuses and mastoids.  Normal marrow signal.  Negative scalp soft tissues.  IMPRESSION: 1.  Patchy acute infarcts in the posterior division  left MCA territory.  No mass effect or hemorrhage. 2.  Underlying chronic small vessel ischemia with other progression since April.   Original Report Authenticated By: Randall An, M.D.     ECHO [id number] - Left ventricle: The cavity size was normal. There was mild concentric hypertrophy. Systolic function was normal. The estimated ejection fraction was in the range of 60% to 65%. Wall motion was normal; there were no regional wall motion abnormalities. - Mitral valve: Calcified annulus. - Atrial septum: No defect or patent foramen ovale was identified.    Review of Systems  Constitutional: Negative.   HENT: Positive for neck pain.   Eyes: Negative.   Respiratory: Negative.   Cardiovascular: Negative.   Gastrointestinal: Negative.   Genitourinary: Negative.   Musculoskeletal: Positive for back pain and joint pain.  Skin: Negative.   Endo/Heme/Allergies: Negative.    Blood pressure 146/80, pulse 67, temperature 98.9 F (37.2 C), temperature source Oral, resp. rate 18, height 5\' 5"  (1.651 m), weight 92.1 kg (203 lb 0.7 oz), SpO2 99.00%. Physical Exam  ASSESSMENT/PLAN:   1. Acute infarcts involving the posterior distribution of the MCA and also involving the PCA both on the left side. This suggested a possible embolic phenomena in particular cardioembolic given that involves 2 different distribution. The patient had a recent echocardiography a few months ago which was fine. He currently is not in atrial fibrillation. If the patient has a vascular variant with the azygous PCA coming off of the ICA, this may explain the current infarcts. Risk factors hypertension, age, diabetes and previous infarcts. The patient has a MRA which is pending. This will be followed. He has been placed on aspirin powders commendation. I will suggest this be continued no longer than 3 months, subsequent to that he should be placed on Plavix only. Dual anti-platelet therapy increase the risk of hemorrhage  without adding additional benefits. Continue with other risk factor reduction including gradual blood pressure control and diabetes control. Also a statin should be considered if not already. I will also consider a homocysteine and vitamin B12 level if not done recently. 2. Fatigue that is unexplained at this time. The patient does have slightly elevated TSH. I will suggest trying to normalize this. He is on Keppra but it is unclear if this is contributing to his fatigue at this time. Adjustments in  the Keppra can be made at a later date once things have been settled with his acute stroke. Seizures with complex partial epilepsy syndrome. Continue with Keppra for now. Multifactorial gait impairment. Etiology includes extensive white matter chronic ischemic changes, previous infarcts, diabetic polyneuropathy and marked osteoarthritis. Extensive ischemic white matter changes with increases risk of cognitive impairment and frank vascular dementia.  SUBJECTIVE: The patient is a 78 year old male who has multiple comorbidities at baseline. He presents with the new onset of right upper extremity weakness. He reports that he may have had some baseline weakness on the right leg. However, he does have significant gait impairment at baseline due to multiple causes including marked osteoarthritis, aging and ischemic white matter changes. The patient has a history of noncompliance but reports that he has been compliant with his aspirin and also his seizure medications. He has had some issues with fatigue recently which is one of the reasons why was consulted today. The patient workup includes an MRI which shows acute infarct involving the left hemisphere.  OBJECTIVE: GENERAL: The patient is in no acute distress.  HEENT: Unremarkable   ABDOMEN: soft  EXTREMITIES: Mild edema of the ankles and legs. There is marked arthritic changes of the knees, ankles and upper extremities.   BACK: Unremarkable.   SKIN: Normal  by inspection.    MENTAL STATUS: Alert. Speech, language and cognition are generally intact. Judgment and insight normal.   CRANIAL NERVES: Pupils are equal, round and reactive to light and accomodation; extra ocular movements are full, there is no significant nystagmus; visual fields are full; upper and lower facial muscles are normal in strength and symmetric, there is no flattening of the nasolabial folds; tongue is midline.  MOTOR: The patient has 3/5 strength of the right upper extremity. The other extremities seems normal.  COORDINATION: No dysmetria noted. No rest tremor; no intention tremor; no bradykinesia.   Brain MRI is reviewed in person. There are multiple moderate to small size hyperintensity seen on diffusion imaging involving the left posterior distribution of the MCA and also the PCA at the left side. The findings suggest possible cardiac embolic phenomenon. The patient has moderate to severe periventricular and deep white matter leukoencephalopathy. There is mild atrophy of the medial temporal area on the coronal cuts. There is also mild to moderate global atrophy.  Labs from 03/2012: Vitamin B12 492 and homocysteine 18.  Phillips Odor, MD Diplomate, American Board of Psychiatry and Neurology (Neurology).   Jamesia Linnen 09/08/2012, 8:39 AM

## 2012-09-08 NOTE — Progress Notes (Signed)
Patient with orders to be discharge home. Discharge instructions given, patient/family verbalized understanding. Patient in stable condition upon discharge. Patient left with family via private vehicle.

## 2012-09-08 NOTE — Evaluation (Addendum)
Occupational Therapy Evaluation Patient Details Name: Chase [name] MRN: [id number] DOB: February 19, 1931 Today's Date: 09/08/2012 Time: [id number] OT Time Calculation (min): 38 min OT eval 15' Self care 23'  OT Assessment / Plan / Recommendation Clinical Impression  Patient is a 78 y/o male s/p Left CVA presenting to acute OT with deficits below. Patient presents with Brunnstrum stage III. Patient has decreased RUE A/ROM and fine/gross motor coordination. Patient will benefit from Home health OT at D/C.    OT Assessment  Patient needs continued OT Services    Follow Up Recommendations  Home health OT;Supervision/Assistance - 24 hour       Equipment Recommendations  Tub/shower bench       Frequency  Min 2X/week    Precautions / Restrictions Precautions Precautions: Fall Precaution Comments: Pt states he was falling at home before the stroke. Restrictions Weight Bearing Restrictions: No   Pertinent Vitals/Pain No report of pain.    ADL  Grooming: Performed;Set up;Wash/dry hands;Brushing hair Where Assessed - Grooming: Supported sitting Lower Body Dressing: +1 Total assistance Where Assessed - Lower Body Dressing: Supported sit to Lobbyist: Performed;Maximal assistance Toilet Transfer Method: Other (comment) (ambulating) Toileting - Clothing Manipulation and Hygiene: Performed;Maximal assistance Where Assessed - Best boy and Hygiene: Standing Equipment Used: Rolling walker;Gait belt Transfers/Ambulation Related to ADLs: Patient transfers at [employer] level with RW from elevated surface. ADL Comments: Increased assistance required for toilet transfer due to low surface. patient has a high rise toilet seat at home.    OT Diagnosis: Generalized weakness;Hemiplegia dominant side  OT Problem List: Decreased strength;Decreased range of motion;Decreased activity tolerance;Decreased coordination;Impaired balance (sitting and/or standing);Impaired UE  functional use;Impaired tone;Decreased knowledge of use of DME or AE;Decreased safety awareness OT Treatment Interventions: Self-care/ADL training;DME and/or AE instruction;Therapeutic exercise;Manual therapy;Energy conservation;Neuromuscular education;Therapeutic activities;Balance training;Patient/family education   OT Goals Acute Rehab OT Goals OT Goal Formulation: With patient Time For Goal Achievement: 09/22/12 Potential to Achieve Goals: Good ADL Goals Pt Will Perform Upper Body Bathing: with min assist;Sitting, edge of bed ADL Goal: Upper Body Bathing - Progress: Goal set today Pt Will Perform Lower Body Bathing: with mod assist;Sit to stand from bed ADL Goal: Lower Body Bathing - Progress: Goal set today Pt Will Perform Upper Body Dressing: with min assist;Sitting, bed ADL Goal: Upper Body Dressing - Progress: Goal set today Pt Will Perform Lower Body Dressing: with mod assist;Sit to stand from bed ADL Goal: Lower Body Dressing - Progress: Goal set today Pt Will Transfer to Toilet: with min assist;3-in-1;Ambulation;with DME;Raised toilet seat without arms;with cueing (comment type and amount) (Min verbal cues) ADL Goal: Toilet Transfer - Progress: Goal set today Arm Goals Pt Will Perform AROM: with minimal assist;to decrease contractures;Right upper extremity;1 set;10 reps Arm Goal: AROM - Progress: Goal set today Additional Arm Goal #1: Patient will perform AAROM; RUE; 1 set; 10 reps; all ranges as tolerated. Arm Goal: Additional Goal #1 - Progress: Goal set today Additional Arm Goal #2: Patient will complete fine/gross motor coordination activities to increase coordination in RUE and increase daily dressing tasks. Arm Goal: Additional Goal #2 - Progress: Goal set today  Visit Information  Last OT Received On: 09/08/12 Assistance Needed: +1 PT/OT Co-Evaluation/Treatment: Yes    Subjective Data  Subjective: "My arm doesn't move like I want it to." Patient Stated Goal: To go  home.   Prior Functioning     Home Living Lives With: Spouse Available Help at Discharge: Available 24 hours/day Type of Home: [place]  Access: Stairs to enter [employer] of Steps: 2 stairs to get into kitchen. Entrance Stairs-Rails: Right Home Layout: One level Bathroom Shower/Tub: Tub/shower unit;Curtain Bathroom Toilet: Handicapped height Home Adaptive Equipment: Environmental consultant - four wheeled Prior Function Level of Independence: Independent with assistive device(s);Needs assistance Needs Assistance: Dressing Dressing: Moderate Able to Take Stairs?: Yes Driving: Yes Communication Communication: No difficulties Dominant Hand: Right            Cognition  Overall Cognitive Status: Appears within functional limits for tasks assessed/performed Arousal/Alertness: Awake/alert Orientation Level: Appears intact for tasks assessed Behavior During Session: [facility] for tasks performed    Extremity/Trunk Assessment Right Upper Extremity Assessment RUE ROM/Strength/Tone: Deficits RUE ROM/Strength/Tone Deficits: MMT: 3-/5. Less than full ROM in all ranges. RUE Sensation: Deficits RUE Sensation Deficits: Decreased proprioception when walking with walker. Needed tactile cues when hand was slipping off handle. RUE Coordination: Deficits RUE Coordination Deficits: impaired gross/fine motor. Left Upper Extremity Assessment LUE ROM/Strength/Tone: WFL for tasks assessed LUE Sensation: WFL - Light Touch;WFL - Proprioception LUE Coordination: WFL - gross/fine motor     Mobility Bed Mobility Bed Mobility: Supine to Sit Supine to Sit: 3: Mod assist Transfers Transfers: Stand to Sit Sit to Stand: 4: Min guard Stand to Sit: 3: Mod assist;To chair/3-in-1;With upper extremity assist          Exercise Hand Exercises Forearm Supination: AAROM;Right;5 reps;Seated Forearm Pronation: AAROM;Right;Seated;5 reps Wrist Flexion: AAROM;Right;5 reps;Seated Wrist Extension:  AAROM;Right;5 reps;Seated Digit Composite Flexion: AAROM;Right;5 reps;Seated Composite Extension: AAROM;Right;5 reps;Seated Opposition: AROM;Limitations;Right;5 reps;Seated Opposition Limitations: less than full range. verbal cues needed.      End of Session OT - End of Session Equipment Utilized During Treatment: Gait belt;Other (comment) (rolling walker) Activity Tolerance: Patient tolerated treatment well Patient left: in chair;with call bell/phone within reach;with chair alarm set;with family/visitor present   GO Self-Care Current Status [id number]) = CM At least 80% but less than 100% impaired Self-Care Goal Status [id number]) = CK At least 40% but less than 60% impaired Self-Care Discharge Status [id number]) CK At least 40% but less than 60% impaired  Ailene Ravel, OTR/L  09/08/2012, 2:48 PM

## 2012-09-08 NOTE — Discharge Summary (Signed)
Physician Discharge Summary  Chase [name] [id number] DOB: 22-Nov-1931 DOA: 09/07/2012  PCP: Tula Nakayama, MD  Admit date: 09/07/2012 Discharge date: 09/08/2012  Recommendations for Outpatient Follow-up:  1. Patient will be set up with home health RN, PT, OT 2. Follow up with neurology, Dr. Merlene Laughter in 1 month 3. Follow up with primary doctor in 2 weeks  Discharge Diagnoses:  Principal Problem:  *CVA (cerebral vascular accident) Active Problems:  HYPOTHYROIDISM  HYPERTENSION  DM (diabetes mellitus) type II uncontrolled with renal manifestation  CKD (chronic kidney disease) stage 4, GFR 15-29 ml/min  Anemia   Discharge Condition: improved  Diet recommendation: low salt, low carb  Filed Weights   09/07/12 1700 09/08/12 0513  Weight: 91 kg (200 lb 9.9 oz) 92.1 kg (203 lb 0.7 oz)    History of present illness:  Chase [name] is a 78 y.o. male with a history of hypertension, diabetes, chronic kidney disease stage IV, seizure disorder, prior stroke. Patient presents to the emergency room today with complaints of right arm numbness and weakness. He reports waking up with the symptoms. Last night he was feeling like his normal self. He denies any recent changes in vision, no difficulty with his speech. He reports some right-sided weakness in his lower extremity as well but feels that this may be chronic. He denies any chest pain, shortness of breath, nausea vomiting, diarrhea. Patient has a history of stroke and has been on aspirin. He reports being compliant with this but admits to occasionally missing a dose. He is also feeling generally tired and weak. He says he also feels very cold. He does also have a history of uncontrolled hypothyroidism, but again now reports compliance with medications. He also feels that the [place] has made him increasingly fatigued. He was evaluated in the emergency room where MRI of the brain showed acute stroke. Patient has been admitted for further  evaluation and treatment.   Hospital Course:  This gentleman was admitted to the hospital with right sided weakness and numbness.  MRI of the brain revealed an acute stroke.  He was kept in the hospital overnight for further monitoring.  Carotid dopplers did not show any significant stenosis.  MRA of the brain did show some severe attenuation of the anterior left M2 branches.  This was discussed with Dr. Merlene Laughter who was following the patient and antiplatelet regimen with both aspirin and plavix for 3 months was recommended, followed by plavix alone.  2D echo was done and found to be unremarkable. His LDL was found to be 100, so I have started him on a statin.  His A1c was found to be 8.3.  This can be further followed by his primary doctor.  I have discontinued his losartan due to his chronic kidney disease and have substituted with hydralazine.  Again, further adjustments of his antihypertensives can be done as an outpatient.  His TSH has significantly improved since last checked from 45 to 5.8.  He was advised to continue current dosing. He was maintained on his other outpatient meds.  He was seen by physical therapy and recommended home health. This has been arranged.  He will be discharged home later today.   Procedures:  2D echo shows EF 65-70% with no wall motion abnormalities, no cardiac source of emboli  Consultations:  Neurology, Dr. Merlene Laughter  Discharge Exam: Filed Vitals:   09/08/12 0003 09/08/12 0156 09/08/12 0513 09/08/12 1500  BP: 135/62 137/60 146/80 124/55  Pulse: 77 74 67 78  Temp:  98.1 F (36.7 C) 97.8 F (36.6 C) 98.9 F (37.2 C) 98.2 F (36.8 C)  TempSrc: Oral  Oral Oral  Resp: 20 20 18 20   Height:      Weight:   92.1 kg (203 lb 0.7 oz)   SpO2: 100% 97% 99% 100%    General: NAD Cardiovascular: s1, s2, rrr Respiratory: CTA B  Discharge Instructions  Discharge Orders    Future Appointments: Provider: Department: Dept Phone: Center:   10/19/2012 9:30 AM  [name], MD [facility] [phone] [facility]     Future Orders Please Complete By Expires   Diet - low sodium heart healthy      Diet Carb Modified      Home Health      Questions: Responses:   To provide the following care/treatments PT    [place]   Face-to-face encounter      Comments:   I MEMON,JEHANZEB certify that this patient is under my care and that I, or a nurse practitioner or physician's assistant working with me, had a face-to-face encounter that meets the physician face-to-face encounter requirements with this patient on 09/08/2012.   Questions: Responses:   The encounter with the patient was in whole, or in part, for the following medical condition, which is the primary reason for home health care stroke, med compliance   I certify that, based on my findings, the following services are medically necessary home health services Nursing    Physical therapy   My clinical findings support the need for the above services Complex treatment plan/patient with lack knowledge disease process and treatment   Further, I certify that my clinical findings support that this patient is homebound due to: Ambulates short distances less than 300 feet   To provide the following care/treatments PT    OT    RN    Home Health Aide   Increase activity slowly      Call MD for:  persistant dizziness or light-headedness      Call MD for:  extreme fatigue      Call MD for:  temperature >100.4          Medication List     As of 09/08/2012  5:06 PM    STOP taking these medications         losartan 100 MG tablet   Commonly known as: COZAAR      TAKE these medications         amLODipine 10 MG tablet   Commonly known as: NORVASC   Take 10 mg by mouth daily. BLOOD PRESSURE      ASPIRIN LOW DOSE 81 MG EC tablet   Generic drug: aspirin   Take 81 mg by mouth daily.      clopidogrel 75 MG tablet   Commonly known as: PLAVIX   Take 1 tablet (75 mg  total) by mouth daily with breakfast.      hydrALAZINE 50 MG tablet   Commonly known as: APRESOLINE   Take 0.5 tablets (25 mg total) by mouth 3 (three) times daily.      insulin lispro protamine-insulin lispro (75-25) 100 UNIT/ML Susp   Commonly known as: HUMALOG 75/25   Inject 15 Units into the skin 2 (two) times daily with a meal.      levETIRAcetam 500 MG tablet   Commonly known as: KEPPRA   Take 500 mg by mouth 2 (two) times daily. FOR  SEIZURE      Levothyroxine Sodium 150 MCG Caps   Take 150 mcg by mouth daily. FOR THYROID      linagliptin 5 MG Tabs tablet   Commonly known as: TRADJENTA   Take 5 mg by mouth daily.      simvastatin 20 MG tablet   Commonly known as: ZOCOR   Take 1 tablet (20 mg total) by mouth every evening.           Follow-up Information    Follow up with [facility]. (RN, PT and OT)    Contact information:   [place] [phone]      Follow up with Tula Nakayama, MD. Schedule an appointment as soon as possible for a visit in 2 weeks.   Contact information:   [place] [address] [phone]       Follow up with [facility], KOFI, MD. Schedule an appointment as soon as possible for a visit in 1 month.   Contact information:   [address] [place] [id number] [phone]           The results of significant diagnostics from this hospitalization (including imaging, microbiology, ancillary and laboratory) are listed below for reference.    Significant Diagnostic Studies: Ct Abdomen Pelvis Wo Contrast  08/10/2012  *RADIOLOGY REPORT*  Clinical Data: Abdominal pain and nausea.  CT ABDOMEN AND PELVIS WITHOUT CONTRAST  Technique:  Multidetector CT imaging of the abdomen and pelvis was performed following the standard protocol without intravenous contrast.  Comparison: Lumbar spine MRI 01/02/2008. No similar prior study is available for comparison.  Findings:  Dependent bilateral lower lobe curvilinear scarring or atelectasis noted.  Mild cardiomegaly noted with trace pericardial fluid.  Left renal cortical thinning noted with malrotation, parapelvic cysts, and prominent probable extrarenal pelvis.  Distal ureteral decompression noted.  No radiopaque renal or ureteral calculus. Gallstones noted without other secondary CT evidence for cholecystitis.  Unenhanced liver demonstrates evidence of probable prior granulomatous disease.  Spleen, right kidney, adrenal glands, pancreas are unremarkable.  The bladder is normal.  Prostatic calcifications are noted. Scattered colonic diverticuli noted without evidence for diverticulitis.  No bowel wall thickening or focal segmental dilatation.  No pelvic free fluid or lymphadenopathy.  The appendix is normal.  Lumbar spine degenerative change again noted.  No acute osseous abnormality.  IMPRESSION: No acute intra-abdominal or pelvic pathology.  Gallstones without other CT evidence for acute cholecystitis. Functionality could be better determined with hepatobiliary nuclear medicine scan if indicated.   Original Report Authenticated By: Arline Asp, M.D.    Dg Chest 2 View  09/07/2012  *RADIOLOGY REPORT*  Clinical Data: Weakness, numbness.  CHEST - 2 VIEW  Comparison: 08/10/2012  Findings: Heart is borderline in size.  Lungs are clear.  No effusions.  No acute bony abnormality.  IMPRESSION: No active cardiopulmonary disease.   Original Report Authenticated By: Raelyn Number, M.D.    Ct Head Wo Contrast  09/07/2012  *RADIOLOGY REPORT*  Clinical Data: Numbness.  CT HEAD WITHOUT CONTRAST  Technique:  Contiguous axial images were obtained from the base of the skull through the vertex without contrast.  Comparison: 03/05/2012  Findings: There is atrophy and chronic small vessel disease changes. No acute intracranial abnormality.  Specifically, no hemorrhage, hydrocephalus, mass lesion, acute infarction, or significant  intracranial injury.  No acute calvarial abnormality. Old right middle ganglia lacunar infarct, stable.  Visualized paranasal sinuses and mastoids  clear.  Orbital soft tissues unremarkable.  IMPRESSION: No acute intracranial abnormality.  Atrophy, chronic microvascular disease.   Original Report Authenticated By: Raelyn Number, M.D.    Mr Jodene Nam Head Wo Contrast  09/08/2012  *RADIOLOGY REPORT*  Clinical Data: Weakness.  Abnormal brain MRI.  MRA HEAD WITHOUT CONTRAST  Technique: Angiographic images of the Circle of Willis were obtained using MRA technique without intravenous contrast.  Comparison: MRI brain 09/07/2012.  Findings: The study is severely degraded by patient motion.  Signal loss at the skull base in the right internal carotid artery is likely artifactual.  There is additional signal loss in the pre cavernous right internal carotid artery.  This likely represents some degree of true stenosis as it is substantiated on the second sequence.  Mild irregularity is present in the cavernous carotid arteries bilaterally without significant stenosis.  The right A1 segment is aplastic.  The anterior communicating artery is patent.  There is mild narrowing of proximal left A1 segment.  Mild narrowing is present in the right M1 segments.  MCA bifurcations are normal bilaterally.  There is proximal attenuation of MCA branch vessels bilaterally with moderate small vessel disease.  The left vertebral artery is the dominant vessel.  The right PICA origin is visualized.  There is some narrowing of the PICA beyond the origin.  The proximal left PICA is not visualized.  The basilar artery demonstrates mild narrowing in its midportion without significant stenosis.  The right posterior cerebral artery is predominately of fetal type.  There is significant signal loss in the distal P2 portion with asymmetric attenuation of branch vessels.  IMPRESSION:  1.  Moderate small vessel disease. 2.  Severe attenuation of anterior left  M2 branches, corresponding to the area of infarction.  This may be secondary to a small vessel stenoses or occlusions. 3.  Moderate stenosis of the pre cavernous right internal carotid artery. 4.  The study is moderately degraded by patient motion. 5.  Asymmetric stenosis of the right posterior cerebral artery, a fetal type vessel.   Original Report Authenticated By: Resa Miner. MATTERN, M.D.    Mr Brain Wo Contrast  09/07/2012  *RADIOLOGY REPORT*  Clinical Data: 78 year old male with weakness, altered mental status, bilateral arm numbness.  MRI HEAD WITHOUT CONTRAST  Technique:  Multiplanar, multiecho pulse sequences of the brain and surrounding structures were obtained according to standard protocol without intravenous contrast.  Comparison: 03/14/2012 and earlier.  Findings: Patchy cortical and subcortical white matter restricted diffusion in the left hemisphere at the level of the left motor strip, pre motor area, and scattered involvement in the left parietal lobe including the sensory strip.  No associated hemorrhage.  Patchy associated T2 and FLAIR hyperintensity.  No mass effect.  No contralateral or posterior fossa restricted diffusion. Major intracranial vascular flow voids are stable.  Stable ventricle size and configuration.  No midline shift, mass effect, or evidence of mass lesion.  Chronic confluent periventricular white matter T2 and FLAIR hyperintensity superimposed on the above findings is stable.  Chronic right basal ganglia lacunar infarct.  Interval small left thalamic lacunar infarcts.  Stable brain stem and cerebellum.  Negative visualized cervical spine, cervicomedullary junction and pituitary.  Visualized orbit soft tissues are within normal limits.  Stable paranasal sinuses and mastoids.  Normal marrow signal.  Negative scalp soft tissues.  IMPRESSION: 1.  Patchy acute infarcts in the posterior division left MCA territory.  No mass effect or hemorrhage. 2.  Underlying chronic small  vessel ischemia with  other progression since April.   Original Report Authenticated By: Randall An, M.D.    US Carotid Duplex Bilateral  09/08/2012  *RADIOLOGY REPORT*  Clinical Data: Acute cerebral infarction. History of diabetes and hypertension.  BILATERAL CAROTID DUPLEX ULTRASOUND  Technique: Pearline Cables scale imaging, color Doppler and duplex ultrasound was performed of bilateral carotid and vertebral arteries in the neck.  Comparison:  None.  Criteria:  Quantification of carotid stenosis is based on velocity parameters that correlate the residual internal carotid diameter with NASCET-based stenosis levels, using the diameter of the distal internal carotid lumen as the denominator for stenosis measurement.  The following velocity measurements were obtained:                   PEAK SYSTOLIC/END DIASTOLIC RIGHT ICA:                        95/19cm/sec CCA:                        [id number] SYSTOLIC ICA/CCA RATIO:     1.0 DIASTOLIC ICA/CCA RATIO:    1.1 ECA:                        71cm/sec  LEFT ICA:                        101/27cm/sec CCA:                        [id number] SYSTOLIC ICA/CCA RATIO:     1.3 DIASTOLIC ICA/CCA RATIO:    2.1 ECA:                        120cm/sec  Findings:  RIGHT CAROTID ARTERY: The common carotid artery demonstrates tortuosity and mild intimal thickening.  No focal plaque is identified.  There is no evidence of right ICA stenosis.  RIGHT VERTEBRAL ARTERY:  Antegrade flow with normal wave form.  LEFT CAROTID ARTERY: The common carotid artery demonstrates intimal thickening.  A mild amount of partially calcified plaque is present at the level of the carotid bulb and proximal ICA.  Estimated left ICA stenosis is less than 50%.  LEFT VERTEBRAL ARTERY:  Antegrade flow with normal wave form.  IMPRESSION: No significant carotid stenosis identified.  No evidence of right ICA stenosis.  Estimated less than 50% left ICA stenosis with mild plaque identified.   Original Report Authenticated By:  Azzie Roup, M.D.    US Venous Img Upper Uni Right  09/08/2012  *RADIOLOGY REPORT*  Clinical Data: Right arm swelling and numbness.  Shoulder pain.  RIGHT UPPER EXTREMITY VENOUS DUPLEX ULTRASOUND  Technique:  Gray-scale sonography with graded compression, as well as color Doppler and duplex ultrasound were performed to evaluate the upper extremity deep venous system from the level of the subclavian vein and including the jugular, axillary, basilic and upper cephalic vein.  Spectral Doppler was utilized to evaluate flow at rest and with distal augmentation maneuvers.  Comparison:  None.  Findings: Visualized portions of right IJ vein are compressible with normal color signal and transmitted right atrial pulsations. Visualized portions of right subclavian vein are compressible with normal color Doppler signal and transmitted right atrial pulsations.  Right axillary, brachial, basilic, radial, ulnar, and cephalic veins show normal compressibility, phasicity, and augmentation with normal color Doppler signal.  IMPRESSION:  No evidence of right upper extremity DVT.   Original Report Authenticated By: Trecia Rogers, M.D.    Dg Chest Port 1 View  08/10/2012  *RADIOLOGY REPORT*  Clinical Data: Chest pain.  High blood pressure.  PORTABLE CHEST - 1 VIEW  Comparison: 03/14/2012.  Findings: Cardiomegaly.  Mild central pulmonary vascular prominence.  Tortuous aorta.  Basilar subsegmental atelectasis.  IMPRESSION: Cardiomegaly.  Mild central pulmonary vascular prominence.  Tortuous aorta.  Basilar subsegmental atelectasis.   Original Report Authenticated By: Doug Sou, M.D.     Microbiology: No results found for this or any previous visit (from the past 240 hour(s)).   Labs: Basic Metabolic Panel:  Lab [id number] 1425  NA 136  K 4.5  CL 106  CO2 23  GLUCOSE 195*  BUN 31*  CREATININE 2.23*  CALCIUM 9.0  MG --  PHOS --   Liver Function Tests:  Lab 09/07/12 1425  AST 17  ALT 11    ALKPHOS 80  BILITOT 0.2*  PROT 6.7  ALBUMIN 3.3*   No results found for this basename: LIPASE:5,AMYLASE:5 in the last 168 hours No results found for this basename: AMMONIA:5 in the last 168 hours CBC:  Lab 09/07/12 1425  WBC 7.1  NEUTROABS 4.5  HGB 9.8*  HCT 29.6*  MCV 95.8  PLT 209   Cardiac Enzymes:  Lab 09/07/12 1425  CKTOTAL --  CKMB --  CKMBINDEX --  TROPONINI <0.30   BNP: BNP (last 3 results) No results found for this basename: PROBNP:3 in the last 8760 hours CBG:  Lab 09/08/12 1631 09/08/12 1143 09/08/12 0733 09/07/12 2058  GLUCAP 131* 170* 146* 168*    Time coordinating discharge: greater than 30 minutes  Signed:  MEMON,JEHANZEB  [facility] 09/08/2012, 5:06 PM

## 2012-09-08 NOTE — Evaluation (Deleted)
     Mobility  Bed Mobility Bed Mobility: Supine to Sit Supine to Sit: 3: Mod assist Transfers Transfers: Sit to Stand (Pt rocks three to four times before standing states this is ) Sit to Stand: 4: Min guard Ambulation/Gait Ambulation/Gait Assistance: 5: Supervision Ambulation Distance (Feet): 150 Feet Assistive device: Rolling walker Gait Pattern: Decreased step length - right;Decreased step length - left;Wide base of support;Decreased dorsiflexion - left;Decreased dorsiflexion - right;Trunk flexed Gait velocity: slow needed to take several rests breaks but Pt and family state that this is how he normally ambulates at home    Shoulder Instructions     Exercises     PT Diagnosis: Difficulty walking;Hemiplegia non-dominant side  PT Problem List: Decreased strength;Decreased balance;Decreased mobility PT Treatment Interventions: Gait training;Functional mobility training;Therapeutic exercise;Therapeutic activities   PT Goals Acute Rehab PT Goals PT Goal Formulation: With patient Time For Goal Achievement: 09/12/12 Potential to Achieve Goals: Good Pt will go Supine/Side to Sit: with min assist PT Goal: Supine/Side to Sit - Progress: Goal set today Pt will go Sit to Supine/Side: with min assist PT Goal: Sit to Supine/Side - Progress: Goal set today Pt will go Stand to Sit: with supervision (with 2 rocks only) PT Goal: Stand to Sit - Progress: Goal set today Pt will Ambulate: 1 - 15 feet;>150 feet;with supervision (with two rest breaks only) PT Goal: Ambulate - Progress: Goal set today Pt will Go Up / Down Stairs: 3-5 stairs;with mod assist PT Goal: Up/Down Stairs - Progress: Goal set today  Visit Information  Last PT Received On: 09/08/12 PT/OT Co-Evaluation/Treatment: Yes    Subjective Data  Subjective: Pt states he uses a walker about 50% of the time. Patient Stated Goal: get back to where [place] home   Prior [place] Lives With: Spouse Available  Help at Discharge: Available 24 hours/day Type of Home: House Home Access: Stairs to enter [employer] of Steps: 2 stairs to get into kitchen. Entrance Stairs-Rails: Right Home Layout: One level Bathroom Shower/Tub: Chiropodist: Standard Home Adaptive Equipment: Environmental consultant - four wheeled Prior Function Level of Independence: Independent with assistive device(s);Needs assistance Needs Assistance: Dressing Dressing: Minimal Able to Take Stairs?: Yes Driving: Yes Communication Communication: No difficulties    Cognition  Overall Cognitive Status: Appears within functional limits for tasks assessed/performed Arousal/Alertness: Awake/alert Orientation Level: Appears intact for tasks assessed Behavior During Session: [facility] for tasks performed    Extremity/Trunk Assessment Right Lower Extremity Assessment RLE ROM/Strength/Tone: [facility] for tasks assessed Left Lower Extremity Assessment LLE ROM/Strength/Tone: [facility] for tasks assessed Trunk Assessment Trunk Assessment: Kyphotic   Balance Balance Balance Assessed: Yes Static Standing Balance Single Leg Stance - Right Leg:  (able to w/ b UE support x 3 sec) Single Leg Stance - Left Leg:  (B UE support x 2 sec.)  End of Session PT - End of Session Equipment Utilized During Treatment: Gait belt Activity Tolerance: Patient tolerated treatment well Patient left: in chair  GP     RUSSELL,CINDY 09/08/2012, 1:52 PM

## 2012-09-08 NOTE — Progress Notes (Signed)
*  PRELIMINARY RESULTS* Echocardiogram 2D Echocardiogram has been performed.  Janalee Dane M 09/08/2012, 11:54 AM

## 2012-09-08 NOTE — Progress Notes (Signed)
UR Chart Review Completed  

## 2012-09-08 NOTE — Care Management Note (Unsigned)
    Page 1 of 1   09/08/2012     3:37:55 PM   CARE MANAGEMENT NOTE 09/08/2012  Patient:  Chase [name], Chase [name]   Account Number:  [id number]  Date Initiated:  09/08/2012  Documentation initiated by:  Claretha Cooper  Subjective/Objective Assessment:   Pt admitted to obs for CVA and Stroke workup.     Action/Plan:   Spoke to pt and wife and daughter. Chose [facility] for RN to assist with medication management.   Anticipated DC Date:  09/08/2012   Anticipated DC Plan:  [place]         Choice offered to / List presented to:          [facility] arranged  [facility]-1 RN  [place] OT      [facility].   Status of service:  In process, will continue to follow Medicare Important Message given?   (If response is "NO", the following Medicare IM given date fields will be blank) Date Medicare IM given:   Date Additional Medicare IM given:    Discharge Disposition:    Per UR Regulation:    If discussed at Long Length of Stay Meetings, dates discussed:    Comments:  09/08/12 Claretha Cooper RN BSN CM

## 2012-09-08 NOTE — Evaluation (Signed)
Physical Therapy Evaluation Patient Details Name: Chase [name] MRN: [id number] DOB: 09-06-1931 Today's Date: 09/08/2012 Time: [id number] PT Time Calculation (min): 21 min  PT Assessment / Plan / Recommendation Clinical Impression  Pt s/p stroke who will need skilled PT to return to prior functional status.  Main deficits appear to be UE related but has some decreased hip strength.    PT Assessment  Patient needs continued PT services    Follow Up Recommendations  Home health PT    Barriers to Discharge  none      Equipment Recommendations  None recommended by PT       Frequency Min 5X/week    Precautions / Restrictions Precautions Precautions: Fall Precaution Comments: Pt states he was falling at home before the stroke.  Restrictions Weight Bearing Restrictions: No   Pertinent Vitals/Pain 0/10      Mobility  Bed Mobility Bed Mobility: Supine to Sit Supine to Sit: 3: Mod assist Transfers Transfers: Sit to Stand (Pt rocks three to four times before standing states this is ) Sit to Stand: 4: Min guard Ambulation/Gait Ambulation/Gait Assistance: 5: Supervision Ambulation Distance (Feet): 150 Feet Assistive device: Rolling walker Gait Pattern: Decreased step length - right;Decreased step length - left;Wide base of support;Decreased dorsiflexion - left;Decreased dorsiflexion - right;Trunk flexed Gait velocity: slow needed to take several rests breaks but Pt and family state that this is how he normally ambulates at home              PT Diagnosis: Difficulty walking;Hemiplegia non-dominant side  PT Problem List: Decreased strength;Decreased balance;Decreased mobility PT Treatment Interventions: Gait training;Functional mobility training;Therapeutic exercise;Therapeutic activities   PT Goals Acute Rehab PT Goals PT Goal Formulation: With patient Time For Goal Achievement: 09/12/12 Potential to Achieve Goals: Good Pt will go Supine/Side to Sit: with min assist PT  Goal: Supine/Side to Sit - Progress: Goal set today Pt will go Sit to Supine/Side: with min assist PT Goal: Sit to Supine/Side - Progress: Goal set today Pt will go Stand to Sit: with supervision (with 2 rocks only) PT Goal: Stand to Sit - Progress: Goal set today Pt will Ambulate: 1 - 15 feet;>150 feet;with supervision (with two rest breaks only) PT Goal: Ambulate - Progress: Goal set today Pt will Go Up / Down Stairs: 3-5 stairs;with mod assist PT Goal: Up/Down Stairs - Progress: Goal set today  Visit Information  Last PT Received On: 09/08/12 PT/OT Co-Evaluation/Treatment: Yes    Subjective Data  Subjective: Pt states he uses a walker about 50% of the time. Patient Stated Goal: get back to where [facility] home   Prior [place] Lives With: Spouse Available Help at Discharge: Available 24 hours/day Type of Home: House Home Access: Stairs to enter [employer] of Steps: 2 stairs to get into kitchen. Entrance Stairs-Rails: Right Home Layout: One level Bathroom Shower/Tub: Chiropodist: Standard Home Adaptive Equipment: Environmental consultant - four wheeled Prior Function Level of Independence: Independent with assistive device(s);Needs assistance Needs Assistance: Dressing Dressing: Minimal Able to Take Stairs?: Yes Driving: Yes Communication Communication: No difficulties    Cognition  Overall Cognitive Status: Appears within functional limits for tasks assessed/performed Arousal/Alertness: Awake/alert Orientation Level: Appears intact for tasks assessed Behavior During Session: [facility] for tasks performed    Extremity/Trunk Assessment Right Lower Extremity Assessment RLE ROM/Strength/Tone: [facility] for tasks assessed Left Lower Extremity Assessment LLE ROM/Strength/Tone: [facility] for tasks assessed Trunk Assessment Trunk Assessment: Kyphotic   Balance Balance Balance Assessed: Yes Static Standing Balance  Single Leg Stance - Right Leg:  (able to w/ b  UE support x 3 sec) Single Leg Stance - Left Leg:  (B UE support x 2 sec.)  End of Session PT - End of Session Equipment Utilized During Treatment: Gait belt Activity Tolerance: Patient tolerated treatment well Patient left: in chair  GP     RUSSELL,CINDY 09/08/2012, 1:56 PM

## 2012-09-09 DIAGNOSIS — G40909 Epilepsy, unspecified, not intractable, without status epilepticus: Secondary | ICD-10-CM | POA: Diagnosis not present

## 2012-09-09 DIAGNOSIS — I129 Hypertensive chronic kidney disease with stage 1 through stage 4 chronic kidney disease, or unspecified chronic kidney disease: Secondary | ICD-10-CM | POA: Diagnosis not present

## 2012-09-09 DIAGNOSIS — E119 Type 2 diabetes mellitus without complications: Secondary | ICD-10-CM | POA: Diagnosis not present

## 2012-09-09 DIAGNOSIS — I69959 Hemiplegia and hemiparesis following unspecified cerebrovascular disease affecting unspecified side: Secondary | ICD-10-CM | POA: Diagnosis not present

## 2012-09-09 DIAGNOSIS — N184 Chronic kidney disease, stage 4 (severe): Secondary | ICD-10-CM | POA: Diagnosis not present

## 2012-09-09 DIAGNOSIS — D649 Anemia, unspecified: Secondary | ICD-10-CM | POA: Diagnosis not present

## 2012-09-10 DIAGNOSIS — N184 Chronic kidney disease, stage 4 (severe): Secondary | ICD-10-CM | POA: Diagnosis not present

## 2012-09-10 DIAGNOSIS — E119 Type 2 diabetes mellitus without complications: Secondary | ICD-10-CM | POA: Diagnosis not present

## 2012-09-10 DIAGNOSIS — D649 Anemia, unspecified: Secondary | ICD-10-CM | POA: Diagnosis not present

## 2012-09-10 DIAGNOSIS — G40909 Epilepsy, unspecified, not intractable, without status epilepticus: Secondary | ICD-10-CM | POA: Diagnosis not present

## 2012-09-10 DIAGNOSIS — I129 Hypertensive chronic kidney disease with stage 1 through stage 4 chronic kidney disease, or unspecified chronic kidney disease: Secondary | ICD-10-CM | POA: Diagnosis not present

## 2012-09-10 DIAGNOSIS — I69959 Hemiplegia and hemiparesis following unspecified cerebrovascular disease affecting unspecified side: Secondary | ICD-10-CM | POA: Diagnosis not present

## 2012-09-12 DIAGNOSIS — I129 Hypertensive chronic kidney disease with stage 1 through stage 4 chronic kidney disease, or unspecified chronic kidney disease: Secondary | ICD-10-CM | POA: Diagnosis not present

## 2012-09-12 DIAGNOSIS — G40909 Epilepsy, unspecified, not intractable, without status epilepticus: Secondary | ICD-10-CM | POA: Diagnosis not present

## 2012-09-12 DIAGNOSIS — D649 Anemia, unspecified: Secondary | ICD-10-CM | POA: Diagnosis not present

## 2012-09-12 DIAGNOSIS — E119 Type 2 diabetes mellitus without complications: Secondary | ICD-10-CM | POA: Diagnosis not present

## 2012-09-12 DIAGNOSIS — I69959 Hemiplegia and hemiparesis following unspecified cerebrovascular disease affecting unspecified side: Secondary | ICD-10-CM | POA: Diagnosis not present

## 2012-09-12 DIAGNOSIS — N184 Chronic kidney disease, stage 4 (severe): Secondary | ICD-10-CM | POA: Diagnosis not present

## 2012-09-13 DIAGNOSIS — I69959 Hemiplegia and hemiparesis following unspecified cerebrovascular disease affecting unspecified side: Secondary | ICD-10-CM | POA: Diagnosis not present

## 2012-09-13 DIAGNOSIS — N184 Chronic kidney disease, stage 4 (severe): Secondary | ICD-10-CM | POA: Diagnosis not present

## 2012-09-13 DIAGNOSIS — E119 Type 2 diabetes mellitus without complications: Secondary | ICD-10-CM | POA: Diagnosis not present

## 2012-09-13 DIAGNOSIS — I129 Hypertensive chronic kidney disease with stage 1 through stage 4 chronic kidney disease, or unspecified chronic kidney disease: Secondary | ICD-10-CM | POA: Diagnosis not present

## 2012-09-13 DIAGNOSIS — D649 Anemia, unspecified: Secondary | ICD-10-CM | POA: Diagnosis not present

## 2012-09-13 DIAGNOSIS — G40909 Epilepsy, unspecified, not intractable, without status epilepticus: Secondary | ICD-10-CM | POA: Diagnosis not present

## 2012-09-14 DIAGNOSIS — I129 Hypertensive chronic kidney disease with stage 1 through stage 4 chronic kidney disease, or unspecified chronic kidney disease: Secondary | ICD-10-CM | POA: Diagnosis not present

## 2012-09-14 DIAGNOSIS — E119 Type 2 diabetes mellitus without complications: Secondary | ICD-10-CM | POA: Diagnosis not present

## 2012-09-14 DIAGNOSIS — N184 Chronic kidney disease, stage 4 (severe): Secondary | ICD-10-CM | POA: Diagnosis not present

## 2012-09-14 DIAGNOSIS — I69959 Hemiplegia and hemiparesis following unspecified cerebrovascular disease affecting unspecified side: Secondary | ICD-10-CM | POA: Diagnosis not present

## 2012-09-14 DIAGNOSIS — D649 Anemia, unspecified: Secondary | ICD-10-CM | POA: Diagnosis not present

## 2012-09-14 DIAGNOSIS — G40909 Epilepsy, unspecified, not intractable, without status epilepticus: Secondary | ICD-10-CM | POA: Diagnosis not present

## 2012-09-15 DIAGNOSIS — I129 Hypertensive chronic kidney disease with stage 1 through stage 4 chronic kidney disease, or unspecified chronic kidney disease: Secondary | ICD-10-CM | POA: Diagnosis not present

## 2012-09-15 DIAGNOSIS — I69959 Hemiplegia and hemiparesis following unspecified cerebrovascular disease affecting unspecified side: Secondary | ICD-10-CM | POA: Diagnosis not present

## 2012-09-15 DIAGNOSIS — E119 Type 2 diabetes mellitus without complications: Secondary | ICD-10-CM | POA: Diagnosis not present

## 2012-09-15 DIAGNOSIS — G40909 Epilepsy, unspecified, not intractable, without status epilepticus: Secondary | ICD-10-CM | POA: Diagnosis not present

## 2012-09-15 DIAGNOSIS — D649 Anemia, unspecified: Secondary | ICD-10-CM | POA: Diagnosis not present

## 2012-09-15 DIAGNOSIS — N184 Chronic kidney disease, stage 4 (severe): Secondary | ICD-10-CM | POA: Diagnosis not present

## 2012-09-16 DIAGNOSIS — I69959 Hemiplegia and hemiparesis following unspecified cerebrovascular disease affecting unspecified side: Secondary | ICD-10-CM | POA: Diagnosis not present

## 2012-09-16 DIAGNOSIS — N184 Chronic kidney disease, stage 4 (severe): Secondary | ICD-10-CM | POA: Diagnosis not present

## 2012-09-16 DIAGNOSIS — D649 Anemia, unspecified: Secondary | ICD-10-CM | POA: Diagnosis not present

## 2012-09-16 DIAGNOSIS — I129 Hypertensive chronic kidney disease with stage 1 through stage 4 chronic kidney disease, or unspecified chronic kidney disease: Secondary | ICD-10-CM | POA: Diagnosis not present

## 2012-09-16 DIAGNOSIS — G40909 Epilepsy, unspecified, not intractable, without status epilepticus: Secondary | ICD-10-CM | POA: Diagnosis not present

## 2012-09-16 DIAGNOSIS — E119 Type 2 diabetes mellitus without complications: Secondary | ICD-10-CM | POA: Diagnosis not present

## 2012-09-19 ENCOUNTER — Telehealth: Payer: Self-pay | Admitting: Family Medicine

## 2012-09-19 DIAGNOSIS — D649 Anemia, unspecified: Secondary | ICD-10-CM | POA: Diagnosis not present

## 2012-09-19 DIAGNOSIS — I69959 Hemiplegia and hemiparesis following unspecified cerebrovascular disease affecting unspecified side: Secondary | ICD-10-CM | POA: Diagnosis not present

## 2012-09-19 DIAGNOSIS — N184 Chronic kidney disease, stage 4 (severe): Secondary | ICD-10-CM | POA: Diagnosis not present

## 2012-09-19 DIAGNOSIS — E119 Type 2 diabetes mellitus without complications: Secondary | ICD-10-CM | POA: Diagnosis not present

## 2012-09-19 DIAGNOSIS — I129 Hypertensive chronic kidney disease with stage 1 through stage 4 chronic kidney disease, or unspecified chronic kidney disease: Secondary | ICD-10-CM | POA: Diagnosis not present

## 2012-09-19 DIAGNOSIS — G40909 Epilepsy, unspecified, not intractable, without status epilepticus: Secondary | ICD-10-CM | POA: Diagnosis not present

## 2012-09-20 DIAGNOSIS — I69959 Hemiplegia and hemiparesis following unspecified cerebrovascular disease affecting unspecified side: Secondary | ICD-10-CM | POA: Diagnosis not present

## 2012-09-20 DIAGNOSIS — R5381 Other malaise: Secondary | ICD-10-CM | POA: Diagnosis not present

## 2012-09-20 DIAGNOSIS — I129 Hypertensive chronic kidney disease with stage 1 through stage 4 chronic kidney disease, or unspecified chronic kidney disease: Secondary | ICD-10-CM | POA: Diagnosis not present

## 2012-09-20 DIAGNOSIS — N184 Chronic kidney disease, stage 4 (severe): Secondary | ICD-10-CM | POA: Diagnosis not present

## 2012-09-20 DIAGNOSIS — I10 Essential (primary) hypertension: Secondary | ICD-10-CM | POA: Diagnosis not present

## 2012-09-20 DIAGNOSIS — R5383 Other fatigue: Secondary | ICD-10-CM | POA: Diagnosis not present

## 2012-09-20 DIAGNOSIS — D649 Anemia, unspecified: Secondary | ICD-10-CM | POA: Diagnosis not present

## 2012-09-20 DIAGNOSIS — E119 Type 2 diabetes mellitus without complications: Secondary | ICD-10-CM | POA: Diagnosis not present

## 2012-09-20 DIAGNOSIS — I6789 Other cerebrovascular disease: Secondary | ICD-10-CM | POA: Diagnosis not present

## 2012-09-20 DIAGNOSIS — E1159 Type 2 diabetes mellitus with other circulatory complications: Secondary | ICD-10-CM | POA: Diagnosis not present

## 2012-09-20 DIAGNOSIS — G40909 Epilepsy, unspecified, not intractable, without status epilepticus: Secondary | ICD-10-CM | POA: Diagnosis not present

## 2012-09-20 NOTE — Telephone Encounter (Signed)
Phone busy x 2

## 2012-09-21 ENCOUNTER — Telehealth: Payer: Self-pay | Admitting: Family Medicine

## 2012-09-21 DIAGNOSIS — E119 Type 2 diabetes mellitus without complications: Secondary | ICD-10-CM | POA: Diagnosis not present

## 2012-09-21 DIAGNOSIS — I69959 Hemiplegia and hemiparesis following unspecified cerebrovascular disease affecting unspecified side: Secondary | ICD-10-CM | POA: Diagnosis not present

## 2012-09-21 DIAGNOSIS — N184 Chronic kidney disease, stage 4 (severe): Secondary | ICD-10-CM | POA: Diagnosis not present

## 2012-09-21 DIAGNOSIS — D649 Anemia, unspecified: Secondary | ICD-10-CM | POA: Diagnosis not present

## 2012-09-21 DIAGNOSIS — I129 Hypertensive chronic kidney disease with stage 1 through stage 4 chronic kidney disease, or unspecified chronic kidney disease: Secondary | ICD-10-CM | POA: Diagnosis not present

## 2012-09-21 DIAGNOSIS — G40909 Epilepsy, unspecified, not intractable, without status epilepticus: Secondary | ICD-10-CM | POA: Diagnosis not present

## 2012-09-21 NOTE — Telephone Encounter (Signed)
Has appt for tomorrow at 1:15.

## 2012-09-21 NOTE — Telephone Encounter (Signed)
Right hand is swollen bad but pt states it doesn't hurt. Both legs and ankles are swelling also.

## 2012-09-21 NOTE — Telephone Encounter (Signed)
Advised if it could not wait until the appt tomorrow that she could take Gambit to the urgent care/ed. Wife said she would if he needed to go

## 2012-09-22 ENCOUNTER — Ambulatory Visit (INDEPENDENT_AMBULATORY_CARE_PROVIDER_SITE_OTHER): Payer: Medicare Other | Admitting: Family Medicine

## 2012-09-22 ENCOUNTER — Encounter: Payer: Self-pay | Admitting: Family Medicine

## 2012-09-22 VITALS — BP 130/70 | HR 90 | Resp 18 | Ht 67.0 in | Wt 203.0 lb

## 2012-09-22 DIAGNOSIS — I10 Essential (primary) hypertension: Secondary | ICD-10-CM

## 2012-09-22 DIAGNOSIS — E039 Hypothyroidism, unspecified: Secondary | ICD-10-CM

## 2012-09-22 DIAGNOSIS — D649 Anemia, unspecified: Secondary | ICD-10-CM | POA: Diagnosis not present

## 2012-09-22 DIAGNOSIS — I69959 Hemiplegia and hemiparesis following unspecified cerebrovascular disease affecting unspecified side: Secondary | ICD-10-CM | POA: Diagnosis not present

## 2012-09-22 DIAGNOSIS — I639 Cerebral infarction, unspecified: Secondary | ICD-10-CM

## 2012-09-22 DIAGNOSIS — E785 Hyperlipidemia, unspecified: Secondary | ICD-10-CM

## 2012-09-22 DIAGNOSIS — I129 Hypertensive chronic kidney disease with stage 1 through stage 4 chronic kidney disease, or unspecified chronic kidney disease: Secondary | ICD-10-CM | POA: Diagnosis not present

## 2012-09-22 DIAGNOSIS — I6350 Cerebral infarction due to unspecified occlusion or stenosis of unspecified cerebral artery: Secondary | ICD-10-CM | POA: Diagnosis not present

## 2012-09-22 DIAGNOSIS — N184 Chronic kidney disease, stage 4 (severe): Secondary | ICD-10-CM | POA: Diagnosis not present

## 2012-09-22 DIAGNOSIS — E119 Type 2 diabetes mellitus without complications: Secondary | ICD-10-CM | POA: Diagnosis not present

## 2012-09-22 DIAGNOSIS — G40209 Localization-related (focal) (partial) symptomatic epilepsy and epileptic syndromes with complex partial seizures, not intractable, without status epilepticus: Secondary | ICD-10-CM

## 2012-09-22 DIAGNOSIS — G40909 Epilepsy, unspecified, not intractable, without status epilepticus: Secondary | ICD-10-CM | POA: Diagnosis not present

## 2012-09-22 MED ORDER — LOVASTATIN 40 MG PO TABS: 40.0000 mg | ORAL_TABLET | Freq: Every day | ORAL | Status: DC

## 2012-09-22 NOTE — Progress Notes (Signed)
  Subjective:    Patient ID: Chase [name], male    DOB: [birth year], 78 y.o.   MRN: [id number]  HPI Pt in for hospital f/u for recent CVA. He has residual right upper and lower extremity weakness He denies any recent seizure activity. Denies depression. Feels fairly well. Is accompanied by his wife who is tearful and overwhelmed by the fact that he needs twice daily insulin, states she is simply unable to administer this   Review of Systems See HPI  Denies recent fever or chills. Denies sinus pressure, nasal congestion, ear pain or sore throat. Denies chest congestion, productive cough or wheezing. Denies chest pains, palpitations and leg swelling Denies abdominal pain, nausea, vomiting,diarrhea or constipation.   Denies dysuria, frequency, hesitancy or incontinence. Chronic joint pain and instability. Denies headaches, seizures, numbness, or tingling. Denies depression, anxiety or insomnia. Denies skin break down or rash.        Objective:   Physical Exam  Patient alert and oriented and in no cardiopulmonary distress.  HEENT: No facial asymmetry, EOMI, no sinus tenderness,  oropharynx pink and moist.  Neck supple no adenopathy.  Chest: Clear to auscultation bilaterally.  CVS: S1, S2 no murmurs, no S3.  ABD: Soft non tender. Bowel sounds normal.  Ext: No edema  MS: decreased ROM spine, shoulders, hips and knees.  Skin: Intact, no ulcerations or rash noted.  Psych: Good eye contact, normal affect. Memory intact not anxious or depressed appearing.  CNS: CN 2-12 intact, power, decreased in right upper and lower extremities.       Assessment & Plan:

## 2012-09-22 NOTE — Patient Instructions (Addendum)
Annual wellness in December, call if you need me before  You need a shingles vaccine, check at your pharmacy   STOP simvastatin and resume lovastatin for cholesterol please  Make appointment with Dr. Dorris Fetch as soon as possible to discuss insulin   Hydrallazine three times daily, 6am, 2pm and 10pm  Kepra two times daily 6am and 6pm

## 2012-09-23 DIAGNOSIS — N184 Chronic kidney disease, stage 4 (severe): Secondary | ICD-10-CM | POA: Diagnosis not present

## 2012-09-23 DIAGNOSIS — E119 Type 2 diabetes mellitus without complications: Secondary | ICD-10-CM | POA: Diagnosis not present

## 2012-09-23 DIAGNOSIS — I129 Hypertensive chronic kidney disease with stage 1 through stage 4 chronic kidney disease, or unspecified chronic kidney disease: Secondary | ICD-10-CM | POA: Diagnosis not present

## 2012-09-23 DIAGNOSIS — G40909 Epilepsy, unspecified, not intractable, without status epilepticus: Secondary | ICD-10-CM | POA: Diagnosis not present

## 2012-09-23 DIAGNOSIS — D649 Anemia, unspecified: Secondary | ICD-10-CM | POA: Diagnosis not present

## 2012-09-23 DIAGNOSIS — I69959 Hemiplegia and hemiparesis following unspecified cerebrovascular disease affecting unspecified side: Secondary | ICD-10-CM | POA: Diagnosis not present

## 2012-09-23 NOTE — Assessment & Plan Note (Signed)
Residual right sided hemiparesois, but able to ambulate with a walker

## 2012-09-23 NOTE — Assessment & Plan Note (Signed)
Controlled, no change in medication DASH diet and commitment to daily physical activity for a minimum of 30 minutes discussed and encouraged, as a part of hypertension management. The importance of attaining a healthy weight is also discussed.  

## 2012-09-23 NOTE — Assessment & Plan Note (Signed)
Improved, no med change Hyperlipidemia:Low fat diet discussed and encouraged.   

## 2012-09-23 NOTE — Assessment & Plan Note (Signed)
Marked improvement, though still under corrected, med non compliance has been the major issue

## 2012-09-23 NOTE — Assessment & Plan Note (Addendum)
Stable, no recent seizure activity, continue kepra

## 2012-09-23 NOTE — Assessment & Plan Note (Signed)
No immediate plans for dialysis, attempts made to better control diabetes and hypertension

## 2012-09-26 DIAGNOSIS — E119 Type 2 diabetes mellitus without complications: Secondary | ICD-10-CM | POA: Diagnosis not present

## 2012-09-26 DIAGNOSIS — I129 Hypertensive chronic kidney disease with stage 1 through stage 4 chronic kidney disease, or unspecified chronic kidney disease: Secondary | ICD-10-CM | POA: Diagnosis not present

## 2012-09-26 DIAGNOSIS — N184 Chronic kidney disease, stage 4 (severe): Secondary | ICD-10-CM | POA: Diagnosis not present

## 2012-09-26 DIAGNOSIS — I69959 Hemiplegia and hemiparesis following unspecified cerebrovascular disease affecting unspecified side: Secondary | ICD-10-CM | POA: Diagnosis not present

## 2012-09-26 DIAGNOSIS — G40909 Epilepsy, unspecified, not intractable, without status epilepticus: Secondary | ICD-10-CM | POA: Diagnosis not present

## 2012-09-26 DIAGNOSIS — D649 Anemia, unspecified: Secondary | ICD-10-CM | POA: Diagnosis not present

## 2012-09-27 DIAGNOSIS — I10 Essential (primary) hypertension: Secondary | ICD-10-CM | POA: Diagnosis not present

## 2012-09-27 DIAGNOSIS — E039 Hypothyroidism, unspecified: Secondary | ICD-10-CM | POA: Diagnosis not present

## 2012-09-27 DIAGNOSIS — E119 Type 2 diabetes mellitus without complications: Secondary | ICD-10-CM | POA: Diagnosis not present

## 2012-09-27 DIAGNOSIS — D649 Anemia, unspecified: Secondary | ICD-10-CM | POA: Diagnosis not present

## 2012-09-27 DIAGNOSIS — E785 Hyperlipidemia, unspecified: Secondary | ICD-10-CM | POA: Diagnosis not present

## 2012-09-27 DIAGNOSIS — I69959 Hemiplegia and hemiparesis following unspecified cerebrovascular disease affecting unspecified side: Secondary | ICD-10-CM | POA: Diagnosis not present

## 2012-09-27 DIAGNOSIS — G40909 Epilepsy, unspecified, not intractable, without status epilepticus: Secondary | ICD-10-CM | POA: Diagnosis not present

## 2012-09-27 DIAGNOSIS — IMO0001 Reserved for inherently not codable concepts without codable children: Secondary | ICD-10-CM | POA: Diagnosis not present

## 2012-09-27 DIAGNOSIS — I129 Hypertensive chronic kidney disease with stage 1 through stage 4 chronic kidney disease, or unspecified chronic kidney disease: Secondary | ICD-10-CM | POA: Diagnosis not present

## 2012-09-27 DIAGNOSIS — N184 Chronic kidney disease, stage 4 (severe): Secondary | ICD-10-CM | POA: Diagnosis not present

## 2012-09-28 DIAGNOSIS — I69959 Hemiplegia and hemiparesis following unspecified cerebrovascular disease affecting unspecified side: Secondary | ICD-10-CM | POA: Diagnosis not present

## 2012-09-28 DIAGNOSIS — E119 Type 2 diabetes mellitus without complications: Secondary | ICD-10-CM | POA: Diagnosis not present

## 2012-09-28 DIAGNOSIS — G40909 Epilepsy, unspecified, not intractable, without status epilepticus: Secondary | ICD-10-CM | POA: Diagnosis not present

## 2012-09-28 DIAGNOSIS — N184 Chronic kidney disease, stage 4 (severe): Secondary | ICD-10-CM | POA: Diagnosis not present

## 2012-09-28 DIAGNOSIS — I129 Hypertensive chronic kidney disease with stage 1 through stage 4 chronic kidney disease, or unspecified chronic kidney disease: Secondary | ICD-10-CM | POA: Diagnosis not present

## 2012-09-28 DIAGNOSIS — D649 Anemia, unspecified: Secondary | ICD-10-CM | POA: Diagnosis not present

## 2012-09-29 ENCOUNTER — Telehealth: Payer: Self-pay | Admitting: Family Medicine

## 2012-09-29 DIAGNOSIS — I69959 Hemiplegia and hemiparesis following unspecified cerebrovascular disease affecting unspecified side: Secondary | ICD-10-CM | POA: Diagnosis not present

## 2012-09-29 DIAGNOSIS — G40909 Epilepsy, unspecified, not intractable, without status epilepticus: Secondary | ICD-10-CM | POA: Diagnosis not present

## 2012-09-29 DIAGNOSIS — D649 Anemia, unspecified: Secondary | ICD-10-CM | POA: Diagnosis not present

## 2012-09-29 DIAGNOSIS — N184 Chronic kidney disease, stage 4 (severe): Secondary | ICD-10-CM | POA: Diagnosis not present

## 2012-09-29 DIAGNOSIS — E119 Type 2 diabetes mellitus without complications: Secondary | ICD-10-CM | POA: Diagnosis not present

## 2012-09-29 DIAGNOSIS — I129 Hypertensive chronic kidney disease with stage 1 through stage 4 chronic kidney disease, or unspecified chronic kidney disease: Secondary | ICD-10-CM | POA: Diagnosis not present

## 2012-09-29 NOTE — Telephone Encounter (Signed)
Spoke with wife and addressed concern over fluid medicine

## 2012-09-30 DIAGNOSIS — G40909 Epilepsy, unspecified, not intractable, without status epilepticus: Secondary | ICD-10-CM | POA: Diagnosis not present

## 2012-09-30 DIAGNOSIS — I129 Hypertensive chronic kidney disease with stage 1 through stage 4 chronic kidney disease, or unspecified chronic kidney disease: Secondary | ICD-10-CM | POA: Diagnosis not present

## 2012-09-30 DIAGNOSIS — N184 Chronic kidney disease, stage 4 (severe): Secondary | ICD-10-CM | POA: Diagnosis not present

## 2012-09-30 DIAGNOSIS — I69959 Hemiplegia and hemiparesis following unspecified cerebrovascular disease affecting unspecified side: Secondary | ICD-10-CM | POA: Diagnosis not present

## 2012-09-30 DIAGNOSIS — D649 Anemia, unspecified: Secondary | ICD-10-CM | POA: Diagnosis not present

## 2012-09-30 DIAGNOSIS — E119 Type 2 diabetes mellitus without complications: Secondary | ICD-10-CM | POA: Diagnosis not present

## 2012-10-03 DIAGNOSIS — E119 Type 2 diabetes mellitus without complications: Secondary | ICD-10-CM | POA: Diagnosis not present

## 2012-10-03 DIAGNOSIS — D649 Anemia, unspecified: Secondary | ICD-10-CM | POA: Diagnosis not present

## 2012-10-03 DIAGNOSIS — N184 Chronic kidney disease, stage 4 (severe): Secondary | ICD-10-CM | POA: Diagnosis not present

## 2012-10-03 DIAGNOSIS — I69959 Hemiplegia and hemiparesis following unspecified cerebrovascular disease affecting unspecified side: Secondary | ICD-10-CM | POA: Diagnosis not present

## 2012-10-03 DIAGNOSIS — G40909 Epilepsy, unspecified, not intractable, without status epilepticus: Secondary | ICD-10-CM | POA: Diagnosis not present

## 2012-10-03 DIAGNOSIS — I129 Hypertensive chronic kidney disease with stage 1 through stage 4 chronic kidney disease, or unspecified chronic kidney disease: Secondary | ICD-10-CM | POA: Diagnosis not present

## 2012-10-04 DIAGNOSIS — I129 Hypertensive chronic kidney disease with stage 1 through stage 4 chronic kidney disease, or unspecified chronic kidney disease: Secondary | ICD-10-CM | POA: Diagnosis not present

## 2012-10-04 DIAGNOSIS — E119 Type 2 diabetes mellitus without complications: Secondary | ICD-10-CM | POA: Diagnosis not present

## 2012-10-04 DIAGNOSIS — G40909 Epilepsy, unspecified, not intractable, without status epilepticus: Secondary | ICD-10-CM | POA: Diagnosis not present

## 2012-10-04 DIAGNOSIS — D649 Anemia, unspecified: Secondary | ICD-10-CM | POA: Diagnosis not present

## 2012-10-04 DIAGNOSIS — N184 Chronic kidney disease, stage 4 (severe): Secondary | ICD-10-CM | POA: Diagnosis not present

## 2012-10-04 DIAGNOSIS — I69959 Hemiplegia and hemiparesis following unspecified cerebrovascular disease affecting unspecified side: Secondary | ICD-10-CM | POA: Diagnosis not present

## 2012-10-06 DIAGNOSIS — N184 Chronic kidney disease, stage 4 (severe): Secondary | ICD-10-CM | POA: Diagnosis not present

## 2012-10-06 DIAGNOSIS — D649 Anemia, unspecified: Secondary | ICD-10-CM | POA: Diagnosis not present

## 2012-10-06 DIAGNOSIS — I69959 Hemiplegia and hemiparesis following unspecified cerebrovascular disease affecting unspecified side: Secondary | ICD-10-CM | POA: Diagnosis not present

## 2012-10-06 DIAGNOSIS — G40909 Epilepsy, unspecified, not intractable, without status epilepticus: Secondary | ICD-10-CM | POA: Diagnosis not present

## 2012-10-06 DIAGNOSIS — E119 Type 2 diabetes mellitus without complications: Secondary | ICD-10-CM | POA: Diagnosis not present

## 2012-10-06 DIAGNOSIS — I129 Hypertensive chronic kidney disease with stage 1 through stage 4 chronic kidney disease, or unspecified chronic kidney disease: Secondary | ICD-10-CM | POA: Diagnosis not present

## 2012-10-06 NOTE — Telephone Encounter (Signed)
Called and left message confirming ok for physical therapy.

## 2012-10-07 DIAGNOSIS — D649 Anemia, unspecified: Secondary | ICD-10-CM | POA: Diagnosis not present

## 2012-10-07 DIAGNOSIS — E119 Type 2 diabetes mellitus without complications: Secondary | ICD-10-CM | POA: Diagnosis not present

## 2012-10-07 DIAGNOSIS — G40909 Epilepsy, unspecified, not intractable, without status epilepticus: Secondary | ICD-10-CM | POA: Diagnosis not present

## 2012-10-07 DIAGNOSIS — I69959 Hemiplegia and hemiparesis following unspecified cerebrovascular disease affecting unspecified side: Secondary | ICD-10-CM | POA: Diagnosis not present

## 2012-10-07 DIAGNOSIS — N184 Chronic kidney disease, stage 4 (severe): Secondary | ICD-10-CM | POA: Diagnosis not present

## 2012-10-07 DIAGNOSIS — I129 Hypertensive chronic kidney disease with stage 1 through stage 4 chronic kidney disease, or unspecified chronic kidney disease: Secondary | ICD-10-CM | POA: Diagnosis not present

## 2012-10-10 DIAGNOSIS — I129 Hypertensive chronic kidney disease with stage 1 through stage 4 chronic kidney disease, or unspecified chronic kidney disease: Secondary | ICD-10-CM | POA: Diagnosis not present

## 2012-10-10 DIAGNOSIS — G40909 Epilepsy, unspecified, not intractable, without status epilepticus: Secondary | ICD-10-CM | POA: Diagnosis not present

## 2012-10-10 DIAGNOSIS — I69959 Hemiplegia and hemiparesis following unspecified cerebrovascular disease affecting unspecified side: Secondary | ICD-10-CM | POA: Diagnosis not present

## 2012-10-10 DIAGNOSIS — N184 Chronic kidney disease, stage 4 (severe): Secondary | ICD-10-CM | POA: Diagnosis not present

## 2012-10-10 DIAGNOSIS — E119 Type 2 diabetes mellitus without complications: Secondary | ICD-10-CM | POA: Diagnosis not present

## 2012-10-10 DIAGNOSIS — D649 Anemia, unspecified: Secondary | ICD-10-CM | POA: Diagnosis not present

## 2012-10-11 DIAGNOSIS — I69959 Hemiplegia and hemiparesis following unspecified cerebrovascular disease affecting unspecified side: Secondary | ICD-10-CM | POA: Diagnosis not present

## 2012-10-11 DIAGNOSIS — G40909 Epilepsy, unspecified, not intractable, without status epilepticus: Secondary | ICD-10-CM | POA: Diagnosis not present

## 2012-10-11 DIAGNOSIS — I129 Hypertensive chronic kidney disease with stage 1 through stage 4 chronic kidney disease, or unspecified chronic kidney disease: Secondary | ICD-10-CM | POA: Diagnosis not present

## 2012-10-11 DIAGNOSIS — N184 Chronic kidney disease, stage 4 (severe): Secondary | ICD-10-CM | POA: Diagnosis not present

## 2012-10-11 DIAGNOSIS — D649 Anemia, unspecified: Secondary | ICD-10-CM | POA: Diagnosis not present

## 2012-10-11 DIAGNOSIS — E119 Type 2 diabetes mellitus without complications: Secondary | ICD-10-CM | POA: Diagnosis not present

## 2012-10-12 DIAGNOSIS — D649 Anemia, unspecified: Secondary | ICD-10-CM | POA: Diagnosis not present

## 2012-10-12 DIAGNOSIS — N184 Chronic kidney disease, stage 4 (severe): Secondary | ICD-10-CM | POA: Diagnosis not present

## 2012-10-12 DIAGNOSIS — G40909 Epilepsy, unspecified, not intractable, without status epilepticus: Secondary | ICD-10-CM | POA: Diagnosis not present

## 2012-10-12 DIAGNOSIS — E119 Type 2 diabetes mellitus without complications: Secondary | ICD-10-CM | POA: Diagnosis not present

## 2012-10-12 DIAGNOSIS — I69959 Hemiplegia and hemiparesis following unspecified cerebrovascular disease affecting unspecified side: Secondary | ICD-10-CM | POA: Diagnosis not present

## 2012-10-12 DIAGNOSIS — I129 Hypertensive chronic kidney disease with stage 1 through stage 4 chronic kidney disease, or unspecified chronic kidney disease: Secondary | ICD-10-CM | POA: Diagnosis not present

## 2012-10-14 DIAGNOSIS — N184 Chronic kidney disease, stage 4 (severe): Secondary | ICD-10-CM | POA: Diagnosis not present

## 2012-10-14 DIAGNOSIS — D649 Anemia, unspecified: Secondary | ICD-10-CM | POA: Diagnosis not present

## 2012-10-14 DIAGNOSIS — I69959 Hemiplegia and hemiparesis following unspecified cerebrovascular disease affecting unspecified side: Secondary | ICD-10-CM | POA: Diagnosis not present

## 2012-10-14 DIAGNOSIS — E119 Type 2 diabetes mellitus without complications: Secondary | ICD-10-CM | POA: Diagnosis not present

## 2012-10-14 DIAGNOSIS — I129 Hypertensive chronic kidney disease with stage 1 through stage 4 chronic kidney disease, or unspecified chronic kidney disease: Secondary | ICD-10-CM | POA: Diagnosis not present

## 2012-10-14 DIAGNOSIS — G40909 Epilepsy, unspecified, not intractable, without status epilepticus: Secondary | ICD-10-CM | POA: Diagnosis not present

## 2012-10-18 DIAGNOSIS — I129 Hypertensive chronic kidney disease with stage 1 through stage 4 chronic kidney disease, or unspecified chronic kidney disease: Secondary | ICD-10-CM | POA: Diagnosis not present

## 2012-10-18 DIAGNOSIS — E119 Type 2 diabetes mellitus without complications: Secondary | ICD-10-CM | POA: Diagnosis not present

## 2012-10-18 DIAGNOSIS — D649 Anemia, unspecified: Secondary | ICD-10-CM | POA: Diagnosis not present

## 2012-10-18 DIAGNOSIS — I69959 Hemiplegia and hemiparesis following unspecified cerebrovascular disease affecting unspecified side: Secondary | ICD-10-CM | POA: Diagnosis not present

## 2012-10-18 DIAGNOSIS — G40909 Epilepsy, unspecified, not intractable, without status epilepticus: Secondary | ICD-10-CM | POA: Diagnosis not present

## 2012-10-18 DIAGNOSIS — N184 Chronic kidney disease, stage 4 (severe): Secondary | ICD-10-CM | POA: Diagnosis not present

## 2012-10-19 ENCOUNTER — Ambulatory Visit: Payer: Medicare Other | Admitting: Family Medicine

## 2012-10-19 DIAGNOSIS — I69959 Hemiplegia and hemiparesis following unspecified cerebrovascular disease affecting unspecified side: Secondary | ICD-10-CM | POA: Diagnosis not present

## 2012-10-19 DIAGNOSIS — E119 Type 2 diabetes mellitus without complications: Secondary | ICD-10-CM | POA: Diagnosis not present

## 2012-10-19 DIAGNOSIS — D649 Anemia, unspecified: Secondary | ICD-10-CM | POA: Diagnosis not present

## 2012-10-19 DIAGNOSIS — N184 Chronic kidney disease, stage 4 (severe): Secondary | ICD-10-CM | POA: Diagnosis not present

## 2012-10-19 DIAGNOSIS — G40909 Epilepsy, unspecified, not intractable, without status epilepticus: Secondary | ICD-10-CM | POA: Diagnosis not present

## 2012-10-19 DIAGNOSIS — I129 Hypertensive chronic kidney disease with stage 1 through stage 4 chronic kidney disease, or unspecified chronic kidney disease: Secondary | ICD-10-CM | POA: Diagnosis not present

## 2012-10-20 ENCOUNTER — Ambulatory Visit (INDEPENDENT_AMBULATORY_CARE_PROVIDER_SITE_OTHER): Payer: Medicare Other | Admitting: Family Medicine

## 2012-10-20 ENCOUNTER — Encounter: Payer: Self-pay | Admitting: Family Medicine

## 2012-10-20 VITALS — BP 148/80 | HR 86 | Resp 18 | Ht 67.0 in | Wt 205.0 lb

## 2012-10-20 DIAGNOSIS — E039 Hypothyroidism, unspecified: Secondary | ICD-10-CM

## 2012-10-20 DIAGNOSIS — E1165 Type 2 diabetes mellitus with hyperglycemia: Secondary | ICD-10-CM

## 2012-10-20 DIAGNOSIS — I6350 Cerebral infarction due to unspecified occlusion or stenosis of unspecified cerebral artery: Secondary | ICD-10-CM | POA: Diagnosis not present

## 2012-10-20 DIAGNOSIS — I639 Cerebral infarction, unspecified: Secondary | ICD-10-CM

## 2012-10-20 DIAGNOSIS — I10 Essential (primary) hypertension: Secondary | ICD-10-CM

## 2012-10-20 DIAGNOSIS — E1129 Type 2 diabetes mellitus with other diabetic kidney complication: Secondary | ICD-10-CM

## 2012-10-20 DIAGNOSIS — I69959 Hemiplegia and hemiparesis following unspecified cerebrovascular disease affecting unspecified side: Secondary | ICD-10-CM | POA: Diagnosis not present

## 2012-10-20 DIAGNOSIS — D649 Anemia, unspecified: Secondary | ICD-10-CM | POA: Diagnosis not present

## 2012-10-20 DIAGNOSIS — I129 Hypertensive chronic kidney disease with stage 1 through stage 4 chronic kidney disease, or unspecified chronic kidney disease: Secondary | ICD-10-CM | POA: Diagnosis not present

## 2012-10-20 DIAGNOSIS — IMO0002 Reserved for concepts with insufficient information to code with codable children: Secondary | ICD-10-CM

## 2012-10-20 DIAGNOSIS — E785 Hyperlipidemia, unspecified: Secondary | ICD-10-CM | POA: Diagnosis not present

## 2012-10-20 DIAGNOSIS — G40909 Epilepsy, unspecified, not intractable, without status epilepticus: Secondary | ICD-10-CM | POA: Diagnosis not present

## 2012-10-20 DIAGNOSIS — N058 Unspecified nephritic syndrome with other morphologic changes: Secondary | ICD-10-CM

## 2012-10-20 DIAGNOSIS — N184 Chronic kidney disease, stage 4 (severe): Secondary | ICD-10-CM | POA: Diagnosis not present

## 2012-10-20 DIAGNOSIS — E119 Type 2 diabetes mellitus without complications: Secondary | ICD-10-CM | POA: Diagnosis not present

## 2012-10-20 DIAGNOSIS — G40209 Localization-related (focal) (partial) symptomatic epilepsy and epileptic syndromes with complex partial seizures, not intractable, without status epilepticus: Secondary | ICD-10-CM

## 2012-10-20 NOTE — Progress Notes (Signed)
  Subjective:    Patient ID: Chase [name], male    DOB: 24-Aug-1931, 77 y.o.   MRN: [id number]  HPI The PT is here for follow up and re-evaluation of chronic medical conditions, medication management and review of any available recent lab and radiology data.  Preventive health is updated, specifically  Cancer screening and Immunization.   Questions or concerns regarding consultations or procedures which the PT has had in the interim are  Addressed.Has seen endo since last visit, taking one dose of insulin daily and blood sugars are seldom over 170 by report The PT denies any adverse reactions to current medications since the last visit.  There are no new concerns.  There are no specific complaints       Review of Systems See HPI Denies recent fever or chills. Denies sinus pressure, nasal congestion, ear pain or sore throat. Denies chest congestion, productive cough or wheezing. Denies chest pains, palpitations and leg swelling Denies abdominal pain, nausea, vomiting,diarrhea or constipation.   Denies dysuria, frequency, hesitancy or incontinence. Chronic joint stiffness and  limitation in mobility. Denies headaches, seizures, numbness, or tingling. Denies depression, anxiety or insomnia. Denies skin break down or rash.        Objective:   Physical Exam Patient alert and oriented and in no cardiopulmonary distress.Looks much improved  HEENT: No facial asymmetry, EOMI, no sinus tenderness,  oropharynx pink and moist.  Neck supple no adenopathy.  Chest: Clear to auscultation bilaterally.  CVS: S1, S2 no murmurs, no S3.  ABD: Soft non tender. Bowel sounds normal.  Ext: No edema  MS: Adequate though reduced  ROM spine, shoulders, hips and knees.  Skin: Intact, no ulcerations or rash noted.  Psych: Good eye contact, normal affect. Memory loss not anxious or depressed appearing.  CNS: CN 2-12 intact, power, tone and sensation normal throughout.        Assessment &  Plan:

## 2012-10-20 NOTE — Patient Instructions (Addendum)
Annual wellness end February.   Call if you need  Me before   Fasting lipid, cmp and EGFR and TSH end Feb BEFORE OV  You look much better, and blood sugar is reported as much better.  Keep appt with Dr Dorris Fetch for blood sugar and Dr Merlene Laughter about the seizures  All the best for the rest of the year

## 2012-10-22 NOTE — Assessment & Plan Note (Signed)
Controlled , no med change °

## 2012-10-22 NOTE — Assessment & Plan Note (Signed)
Needs to f/u with neurology

## 2012-10-22 NOTE — Assessment & Plan Note (Signed)
Hyperlipidemia:Low fat diet discussed and encouraged.  Controlled, no change in medication   

## 2012-10-22 NOTE — Assessment & Plan Note (Addendum)
UnControlled, no change in medication DASH diet and commitment to daily physical activity for a minimum of 30 minutes discussed and encouraged, as a part of hypertension management. The importance of attaining a healthy weight is also discussed.

## 2012-10-22 NOTE — Assessment & Plan Note (Addendum)
No significant residual weakness

## 2012-10-24 DIAGNOSIS — R569 Unspecified convulsions: Secondary | ICD-10-CM | POA: Diagnosis not present

## 2012-10-26 DIAGNOSIS — D649 Anemia, unspecified: Secondary | ICD-10-CM | POA: Diagnosis not present

## 2012-10-26 DIAGNOSIS — E119 Type 2 diabetes mellitus without complications: Secondary | ICD-10-CM | POA: Diagnosis not present

## 2012-10-26 DIAGNOSIS — G40909 Epilepsy, unspecified, not intractable, without status epilepticus: Secondary | ICD-10-CM | POA: Diagnosis not present

## 2012-10-26 DIAGNOSIS — N184 Chronic kidney disease, stage 4 (severe): Secondary | ICD-10-CM | POA: Diagnosis not present

## 2012-10-26 DIAGNOSIS — I69959 Hemiplegia and hemiparesis following unspecified cerebrovascular disease affecting unspecified side: Secondary | ICD-10-CM | POA: Diagnosis not present

## 2012-10-26 DIAGNOSIS — I129 Hypertensive chronic kidney disease with stage 1 through stage 4 chronic kidney disease, or unspecified chronic kidney disease: Secondary | ICD-10-CM | POA: Diagnosis not present

## 2012-10-27 DIAGNOSIS — D649 Anemia, unspecified: Secondary | ICD-10-CM | POA: Diagnosis not present

## 2012-10-27 DIAGNOSIS — I129 Hypertensive chronic kidney disease with stage 1 through stage 4 chronic kidney disease, or unspecified chronic kidney disease: Secondary | ICD-10-CM | POA: Diagnosis not present

## 2012-10-27 DIAGNOSIS — I69959 Hemiplegia and hemiparesis following unspecified cerebrovascular disease affecting unspecified side: Secondary | ICD-10-CM | POA: Diagnosis not present

## 2012-10-27 DIAGNOSIS — N184 Chronic kidney disease, stage 4 (severe): Secondary | ICD-10-CM | POA: Diagnosis not present

## 2012-10-27 DIAGNOSIS — G40909 Epilepsy, unspecified, not intractable, without status epilepticus: Secondary | ICD-10-CM | POA: Diagnosis not present

## 2012-10-27 DIAGNOSIS — E119 Type 2 diabetes mellitus without complications: Secondary | ICD-10-CM | POA: Diagnosis not present

## 2012-10-28 DIAGNOSIS — D649 Anemia, unspecified: Secondary | ICD-10-CM | POA: Diagnosis not present

## 2012-10-28 DIAGNOSIS — I129 Hypertensive chronic kidney disease with stage 1 through stage 4 chronic kidney disease, or unspecified chronic kidney disease: Secondary | ICD-10-CM | POA: Diagnosis not present

## 2012-10-28 DIAGNOSIS — N184 Chronic kidney disease, stage 4 (severe): Secondary | ICD-10-CM | POA: Diagnosis not present

## 2012-10-28 DIAGNOSIS — G40909 Epilepsy, unspecified, not intractable, without status epilepticus: Secondary | ICD-10-CM | POA: Diagnosis not present

## 2012-10-28 DIAGNOSIS — I69959 Hemiplegia and hemiparesis following unspecified cerebrovascular disease affecting unspecified side: Secondary | ICD-10-CM | POA: Diagnosis not present

## 2012-10-28 DIAGNOSIS — E119 Type 2 diabetes mellitus without complications: Secondary | ICD-10-CM | POA: Diagnosis not present

## 2012-11-01 DIAGNOSIS — D649 Anemia, unspecified: Secondary | ICD-10-CM | POA: Diagnosis not present

## 2012-11-01 DIAGNOSIS — G40909 Epilepsy, unspecified, not intractable, without status epilepticus: Secondary | ICD-10-CM | POA: Diagnosis not present

## 2012-11-01 DIAGNOSIS — I129 Hypertensive chronic kidney disease with stage 1 through stage 4 chronic kidney disease, or unspecified chronic kidney disease: Secondary | ICD-10-CM | POA: Diagnosis not present

## 2012-11-01 DIAGNOSIS — I69959 Hemiplegia and hemiparesis following unspecified cerebrovascular disease affecting unspecified side: Secondary | ICD-10-CM | POA: Diagnosis not present

## 2012-11-01 DIAGNOSIS — E119 Type 2 diabetes mellitus without complications: Secondary | ICD-10-CM | POA: Diagnosis not present

## 2012-11-01 DIAGNOSIS — N184 Chronic kidney disease, stage 4 (severe): Secondary | ICD-10-CM | POA: Diagnosis not present

## 2012-11-02 DIAGNOSIS — I129 Hypertensive chronic kidney disease with stage 1 through stage 4 chronic kidney disease, or unspecified chronic kidney disease: Secondary | ICD-10-CM | POA: Diagnosis not present

## 2012-11-02 DIAGNOSIS — N184 Chronic kidney disease, stage 4 (severe): Secondary | ICD-10-CM | POA: Diagnosis not present

## 2012-11-02 DIAGNOSIS — E119 Type 2 diabetes mellitus without complications: Secondary | ICD-10-CM | POA: Diagnosis not present

## 2012-11-02 DIAGNOSIS — I69959 Hemiplegia and hemiparesis following unspecified cerebrovascular disease affecting unspecified side: Secondary | ICD-10-CM | POA: Diagnosis not present

## 2012-11-02 DIAGNOSIS — G40909 Epilepsy, unspecified, not intractable, without status epilepticus: Secondary | ICD-10-CM | POA: Diagnosis not present

## 2012-11-02 DIAGNOSIS — D649 Anemia, unspecified: Secondary | ICD-10-CM | POA: Diagnosis not present

## 2012-12-12 DIAGNOSIS — IMO0001 Reserved for inherently not codable concepts without codable children: Secondary | ICD-10-CM | POA: Diagnosis not present

## 2012-12-12 DIAGNOSIS — I10 Essential (primary) hypertension: Secondary | ICD-10-CM | POA: Diagnosis not present

## 2012-12-22 DIAGNOSIS — R569 Unspecified convulsions: Secondary | ICD-10-CM | POA: Diagnosis not present

## 2012-12-23 ENCOUNTER — Other Ambulatory Visit: Payer: Self-pay

## 2012-12-23 MED ORDER — CLOPIDOGREL BISULFATE 75 MG PO TABS: 75.0000 mg | ORAL_TABLET | Freq: Every day | ORAL | Status: DC

## 2013-01-25 DIAGNOSIS — E119 Type 2 diabetes mellitus without complications: Secondary | ICD-10-CM | POA: Diagnosis not present

## 2013-01-25 DIAGNOSIS — E1159 Type 2 diabetes mellitus with other circulatory complications: Secondary | ICD-10-CM | POA: Diagnosis not present

## 2013-02-01 ENCOUNTER — Ambulatory Visit (INDEPENDENT_AMBULATORY_CARE_PROVIDER_SITE_OTHER): Payer: Medicare Other | Admitting: Family Medicine

## 2013-02-01 ENCOUNTER — Encounter: Payer: Self-pay | Admitting: Family Medicine

## 2013-02-01 VITALS — BP 150/70 | HR 76 | Resp 16 | Ht 67.0 in | Wt 192.4 lb

## 2013-02-01 DIAGNOSIS — Z0000 Encounter for general adult medical examination without abnormal findings: Secondary | ICD-10-CM

## 2013-02-01 NOTE — Patient Instructions (Addendum)
F/u in 4 month, call if you need  Me before  You need to start walking every day for 30 minutes total.   We will call when we get results of last blood work from Dr Dorris Fetch  Please call for your appt with Dr Luretha Murphy need to go back to physical therapy let me know when you decide, call the office if you decide before

## 2013-02-01 NOTE — Progress Notes (Signed)
Subjective:    Patient ID: Chase [name], male    DOB: 01-25-1931, 79 y.o.   MRN: [id number]  HPI Preventive Screening-Counseling & Management   Patient present here today for a Medicare annual wellness visit.   Current Problems (verified)   Medications Prior to Visit Allergies (verified)   PAST HISTORY  Family History:no known f/h of CAD, CVD or cancer  Social History Married x 9 years father of 6 children. 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 No  Over the past two weeks, have you felt little interest or pleasure in doing things? No  Have you lost interest or pleasure in daily life? No  Do you often feel hopeless? No  Do you cry easily over simple problems? No   Activities of Daily Living  In your present state of health, do you have any difficulty performing the following activities?  Driving?: pt recently had seizures and this is to be determined by neurologist Managing money?: along with his wife they are able to take care of bills Feeding yourself?:No Getting from bed to chair?:yes needs to be careful Climbing a flight of stairs?:yes needs to be careful Preparing food and eating?:no Bathing or showering?:No Getting dressed?:No Getting to the toilet?:No Using the toilet?:No Moving around from place to place?: yes unsteady gait with neuropathy   Fall Risk Assessment In the past year have you fallen or had a near fall?:No Are you currently taking any medications that make you dizziness?:No   Hearing Difficulties: No Do you often ask people to speak up or repeat  themselves?:No Do you experience ringing or noises in your ears?:No Do you have difficulty understanding soft or whispered voices?:No  Cognitive Testing  Alert? Yes Normal Appearance?Yes  Oriented to person? Yes Place? Yes  Time? Yes  Displays appropriate judgment?Yes  Can read the correct time from a watch face? yes Are you having problems remembering things?yes MMSE 26/30 on 02.26/2014  Advanced Directives have been discussed with the patient?Yes , full code   List the Names of Other Physician/Practitioners you currently use: Dr Dorris Fetch, Dr Nils Pyle, Dr Radford Pax, Merlene Laughter, [place] any recent Medical Services you may have received from other than [facility] providers in the past year (date may be approximate).   Assessment:    Annual Wellness Exam   Plan:    During the course of the visit the patient was educated and counseled about appropriate screening and preventive services including:  A healthy diet is rich in fruit, vegetables and whole grains. Poultry fish, nuts and beans are a healthy choice for protein rather then red meat. A low sodium diet and drinking 64 ounces of water daily is generally recommended. Oils and sweet should be limited. Carbohydrates especially for those who are diabetic or overweight, should be limited to 30-45 gram per meal. It is important to eat on a regular schedule, at least 3 times daily. Snacks should be primarily fruits, vegetables or nuts. It is important that you exercise regularly at least 30 minutes 5 times a week. If you develop chest pain, have severe difficulty breathing,  or feel very tired, stop exercising immediately and seek medical attention  Immunization reviewed and updated. Cancer screening reviewed and updated    Patient Instructions (the written plan) was given to the patient.  Medicare Attestation  I have personally reviewed:  The patient's medical and social history  Their use of alcohol, tobacco or illicit drugs  Their  current medications and supplements  The patient's functional ability including ADLs,fall risks, home safety risks, cognitive, and hearing and visual impairment  Diet and physical activities  Evidence for depression or mood disorders  The patient's weight, height, BMI, and visual acuity have been recorded in the chart. I have made referrals, counseling, and provided education to the patient based on review of the above and I have provided the patient with a written personalized care plan for preventive services.      Review of Systems     Objective:   Physical Exam        Assessment & Plan:

## 2013-02-02 ENCOUNTER — Emergency Department (HOSPITAL_COMMUNITY): Payer: Medicare Other

## 2013-02-02 ENCOUNTER — Emergency Department (HOSPITAL_COMMUNITY)
Admission: EM | Admit: 2013-02-02 | Discharge: 2013-02-02 | Disposition: A | Discharge status: Home / Self Care | Payer: Medicare Other | Attending: Emergency Medicine | Admitting: Emergency Medicine

## 2013-02-02 ENCOUNTER — Telehealth: Payer: Self-pay | Admitting: Family Medicine

## 2013-02-02 ENCOUNTER — Encounter (HOSPITAL_COMMUNITY): Payer: Self-pay | Admitting: *Deleted

## 2013-02-02 DIAGNOSIS — S0081XA Abrasion of other part of head, initial encounter: Secondary | ICD-10-CM

## 2013-02-02 DIAGNOSIS — Z7902 Long term (current) use of antithrombotics/antiplatelets: Secondary | ICD-10-CM | POA: Insufficient documentation

## 2013-02-02 DIAGNOSIS — S6990XA Unspecified injury of unspecified wrist, hand and finger(s), initial encounter: Secondary | ICD-10-CM | POA: Diagnosis not present

## 2013-02-02 DIAGNOSIS — E669 Obesity, unspecified: Secondary | ICD-10-CM | POA: Insufficient documentation

## 2013-02-02 DIAGNOSIS — Z8739 Personal history of other diseases of the musculoskeletal system and connective tissue: Secondary | ICD-10-CM | POA: Diagnosis not present

## 2013-02-02 DIAGNOSIS — Z79899 Other long term (current) drug therapy: Secondary | ICD-10-CM | POA: Diagnosis not present

## 2013-02-02 DIAGNOSIS — E1149 Type 2 diabetes mellitus with other diabetic neurological complication: Secondary | ICD-10-CM | POA: Insufficient documentation

## 2013-02-02 DIAGNOSIS — E039 Hypothyroidism, unspecified: Secondary | ICD-10-CM | POA: Diagnosis not present

## 2013-02-02 DIAGNOSIS — G40909 Epilepsy, unspecified, not intractable, without status epilepticus: Secondary | ICD-10-CM | POA: Diagnosis not present

## 2013-02-02 DIAGNOSIS — Z8659 Personal history of other mental and behavioral disorders: Secondary | ICD-10-CM | POA: Insufficient documentation

## 2013-02-02 DIAGNOSIS — W010XXA Fall on same level from slipping, tripping and stumbling without subsequent striking against object, initial encounter: Secondary | ICD-10-CM | POA: Insufficient documentation

## 2013-02-02 DIAGNOSIS — IMO0002 Reserved for concepts with insufficient information to code with codable children: Secondary | ICD-10-CM | POA: Insufficient documentation

## 2013-02-02 DIAGNOSIS — M1712 Unilateral primary osteoarthritis, left knee: Secondary | ICD-10-CM

## 2013-02-02 DIAGNOSIS — S8990XA Unspecified injury of unspecified lower leg, initial encounter: Secondary | ICD-10-CM | POA: Diagnosis not present

## 2013-02-02 DIAGNOSIS — W102XXA Fall (on)(from) incline, initial encounter: Secondary | ICD-10-CM

## 2013-02-02 DIAGNOSIS — E1142 Type 2 diabetes mellitus with diabetic polyneuropathy: Secondary | ICD-10-CM | POA: Diagnosis not present

## 2013-02-02 DIAGNOSIS — N184 Chronic kidney disease, stage 4 (severe): Secondary | ICD-10-CM | POA: Insufficient documentation

## 2013-02-02 DIAGNOSIS — M25469 Effusion, unspecified knee: Secondary | ICD-10-CM | POA: Diagnosis not present

## 2013-02-02 DIAGNOSIS — Z794 Long term (current) use of insulin: Secondary | ICD-10-CM | POA: Insufficient documentation

## 2013-02-02 DIAGNOSIS — M1710 Unilateral primary osteoarthritis, unspecified knee: Secondary | ICD-10-CM | POA: Diagnosis not present

## 2013-02-02 DIAGNOSIS — S0990XA Unspecified injury of head, initial encounter: Secondary | ICD-10-CM | POA: Diagnosis not present

## 2013-02-02 DIAGNOSIS — I129 Hypertensive chronic kidney disease with stage 1 through stage 4 chronic kidney disease, or unspecified chronic kidney disease: Secondary | ICD-10-CM | POA: Diagnosis not present

## 2013-02-02 DIAGNOSIS — Z8673 Personal history of transient ischemic attack (TIA), and cerebral infarction without residual deficits: Secondary | ICD-10-CM | POA: Insufficient documentation

## 2013-02-02 DIAGNOSIS — S80212A Abrasion, left knee, initial encounter: Secondary | ICD-10-CM

## 2013-02-02 DIAGNOSIS — I739 Peripheral vascular disease, unspecified: Secondary | ICD-10-CM | POA: Diagnosis not present

## 2013-02-02 DIAGNOSIS — S0993XA Unspecified injury of face, initial encounter: Secondary | ICD-10-CM | POA: Diagnosis not present

## 2013-02-02 DIAGNOSIS — Y9289 Other specified places as the place of occurrence of the external cause: Secondary | ICD-10-CM | POA: Insufficient documentation

## 2013-02-02 DIAGNOSIS — Z8679 Personal history of other diseases of the circulatory system: Secondary | ICD-10-CM | POA: Insufficient documentation

## 2013-02-02 DIAGNOSIS — E785 Hyperlipidemia, unspecified: Secondary | ICD-10-CM | POA: Diagnosis not present

## 2013-02-02 DIAGNOSIS — Y9389 Activity, other specified: Secondary | ICD-10-CM | POA: Insufficient documentation

## 2013-02-02 DIAGNOSIS — S60512A Abrasion of left hand, initial encounter: Secondary | ICD-10-CM

## 2013-02-02 DIAGNOSIS — M25569 Pain in unspecified knee: Secondary | ICD-10-CM | POA: Diagnosis not present

## 2013-02-02 MED ORDER — BACITRACIN ZINC 500 UNIT/GM EX OINT
TOPICAL_OINTMENT | CUTANEOUS | Status: AC
  Administered 2013-02-02: 22:00:00
  Filled 2013-02-02: qty 0.9

## 2013-02-02 NOTE — ED Provider Notes (Signed)
History     CSN: [id number]  Arrival date & time 02/02/13  1648   First MD Initiated Contact with Patient 02/02/13 2007      Chief Complaint  Patient presents with  . Facial Laceration     HPI Pt was seen at 2020.  Per pt and family, c/o sudden onset and resolution of one episode of trip and fall that occurred this afternoon.  Pt states he tripped while stepping up a curb and fell forward. Pt c/o abrasions to left hand, left knee and right forehead. Denies syncope/LOC, no AMS, no prodromal symptoms before fall, no CP/palpitations, no SOB/cough, no abd pain, no visual changes, no focal motor weakness, no tingling/numbness in extremities, no neck or back pain.     Past Medical History  Diagnosis Date  . Diabetes mellitus   . Hypothyroidism   . Hypertension   . Peripheral vascular disease, unspecified   . Unspecified hypothyroidism   . Depressive disorder, not elsewhere classified   . Obesity   . Other and unspecified hyperlipidemia   . Unspecified essential hypertension   . Pain in joint, lower leg   . Osteoarthrosis, unspecified whether generalized or localized, lower leg   . Derangement of meniscus, not elsewhere classified   . Lumbago   . Spondylosis of unspecified site without mention of myelopathy   . Spinal stenosis, unspecified region other than cervical   . Backache, unspecified   . Family history of diabetes mellitus   . Seizures   . Complete lesion of cervical spinal cord 03/14/3012    Stable since 2006  . Lacunar stroke, acute 03/14/2012  . Bradycardia 03/15/2012  . Gait disorder   . Knee contracture   . Diabetic neuropathy   . CVA (cerebrovascular accident) 09/07/12  . CKD (chronic kidney disease) stage 3, GFR 30-59 ml/min   . CKD (chronic kidney disease) stage 4, GFR 15-29 ml/min     Past Surgical History  Procedure Laterality Date  . Kidney stones left x2  1975  . Kidney surgery      Family History  Problem Relation Age of Onset  . Diabetes Mother   .  Prostate cancer Father   . Diabetes Sister   . Diabetes Brother   . Diabetes Brother   . Hypertension Brother   . Hypertension Brother   . Hypertension Brother     History  Substance Use Topics  . Smoking status: Never Smoker   . Smokeless tobacco: Not on file  . Alcohol Use: No     Review of Systems ROS: Statement: All systems negative except as marked or noted in the HPI; Constitutional: Negative for fever and chills. ; ; Eyes: Negative for eye pain, redness and discharge. ; ; ENMT: Negative for ear pain, hoarseness, nasal congestion, sinus pressure and sore throat. ; ; Cardiovascular: Negative for chest pain, palpitations, diaphoresis, dyspnea and peripheral edema. ; ; Respiratory: Negative for cough, wheezing and stridor. ; ; Gastrointestinal: Negative for nausea, vomiting, diarrhea, abdominal pain, blood in stool, hematemesis, jaundice and rectal bleeding. . ; ; Genitourinary: Negative for dysuria, flank pain and hematuria. ; ; Musculoskeletal: Negative for back pain and neck pain. Negative for swelling and deformity.; ; Skin: +abrasions.  Negative for pruritus, rash, blisters, bruising and skin lesion.; ; Neuro: Negative for headache, lightheadedness and neck stiffness. Negative for weakness, altered level of consciousness , altered mental status, extremity weakness, paresthesias, involuntary movement, seizure and syncope.      Allergies  Bayer advanced aspirin and  Penicillins  Home Medications   Current Outpatient Rx  Name  Route  Sig  Dispense  Refill  . clopidogrel (PLAVIX) 75 MG tablet   Oral   Take 75 mg by mouth every morning.         . levETIRAcetam (KEPPRA) 500 MG tablet   Oral   Take 500 mg by mouth every morning. FOR SEIZURE         . Levothyroxine Sodium 150 MCG CAPS   Oral   Take 150 mcg by mouth every morning. FOR THYROID         . lovastatin (MEVACOR) 40 MG tablet   Oral   Take 1 tablet (40 mg total) by mouth at bedtime.   30 tablet   5      Discontinue simvasatin effective 09/22/2012   . Throat Lozenges (COUGH DROPS MENTHOL MT)   Mouth/Throat   Use as directed 1 lozenge in the mouth or throat daily as needed (for cough).         Marland Kitchen amLODipine (NORVASC) 10 MG tablet   Oral   Take 10 mg by mouth daily. BLOOD PRESSURE         . hydrALAZINE (APRESOLINE) 50 MG tablet   Oral   Take 25 mg by mouth 2 (two) times daily.         . insulin lispro protamine-insulin lispro (HUMALOG 75/25) (75-25) 100 UNIT/ML SUSP   Subcutaneous   Inject 15 Units into the skin 2 (two) times daily with a meal.           BP 150/63  Pulse 64  Temp(Src) 98.2 F (36.8 C) (Oral)  Resp 18  Ht 5\' 8"  (1.727 m)  Wt 192 lb (87.091 kg)  BMI 29.2 kg/m2  SpO2 100%  Physical Exam 2025: Physical examination: Vital signs and O2 SAT: Reviewed; Constitutional: Well developed, Well nourished, Well hydrated, In no acute distress; Head and Face: Normocephalic, No scalp hematomas, no lacs. +very superficial hemostatic abrasion to right forehead.  Non-tender to palp superior and inferior orbital rim areas.  No zygoma tenderness.  No mandibular tenderness.; Eyes: EOMI, PERRL, No scleral icterus; ENMT: Mouth and pharynx normal, Left TM normal, Right TM normal, Mucous membranes moist, +teeth and tongue intact.  No intraoral or intranasal bleeding.  No septal hematomas.  No trismus, no malocclusion.; Neck: Supple, Trachea midline; Spine: No midline CS, TS, LS tenderness.; Cardiovascular: Regular rate and rhythm, No gallop; Respiratory: Breath sounds clear & equal bilaterally, No wheezes, Normal respiratory effort/excursion; Chest: Nontender, No deformity, Movement normal, No crepitus, No abrasions or ecchymosis.; Abdomen: Soft, Nontender, Nondistended, Normal bowel sounds, No abrasions or ecchymosis.; Genitourinary: No CVA tenderness;; Extremities: No deformity, Full range of motion major/large joints of bilat UE's and LE's without pain or tenderness to palp,  Neurovascularly intact, Pulses normal, No tenderness, No edema, Pelvis stable. NT left fingers/hand/wrist/elbow/shoulder.  NT left hip/knee/ankle/foot. FROM left hand and wrist without pain.  +FROM left knee, including able to lift extended LLE off stretcher, and extend left lower leg against gravity.  No ligamentous laxity.  No patellar or quad tendon step-offs.  NMS intact left foot, strong pedal pp. +plantarflexion of left foot w/calf squeeze.  No palpable gap left Achilles's tendon.  No proximal fibular head tenderness. +mild localized edema to patella with very superficial small abrasion.  No left knee erythema, warmth, ecchymosis or deformity. ; Neuro: AA&Ox3, Major CN grossly intact. Speech clear. No gross focal motor or sensory deficits in extremities.; Skin: Color normal, Warm,  Dry, +very superficial hemostatic small abrasion to dorsal left hand and left knee.    ED Course  Procedures     MDM  MDM Reviewed: previous chart, nursing note and vitals Interpretation: x-ray and CT scan    Ct Head Wo Contrast 02/02/2013  *RADIOLOGY REPORT*  Clinical Data:  The patient tripped and fell with a laceration above the right eye.  No loss of consciousness.  CT HEAD WITHOUT CONTRAST CT CERVICAL SPINE WITHOUT CONTRAST  Technique:  Multidetector CT imaging of the head and cervical spine was performed following the standard protocol without intravenous contrast.  Multiplanar CT image reconstructions of the cervical spine were also generated.  Comparison:  CT head 09/07/2012  CT HEAD  Findings: Mild diffuse cerebral atrophy.  Patchy low attenuation changes in the deep white matter consistent with small vessel ischemia.  Focal encephalomalacia extends to the left posterior parietal convexity at the vertex is more prominent than on previous study suggesting old infarct.  Ventricles are not dilated.  Old lacunar infarcts in the basal ganglia.  No mass effect or midline shift.  No abnormal extra-axial fluid  collections.  Gray-white matter junctions are distinct.  Basal cisterns are not effaced.  No depressed skull fractures.  Soft tissue injury superior to the right orbit.  Visualized paranasal sinuses and mastoid air cells are not opacified.  IMPRESSION: No acute intracranial abnormalities.  Chronic atrophy and small vessel ischemic changes with old left posterior parietal infarct.  CT CERVICAL SPINE  Findings: Normal alignment of the cervical vertebrae and facet joints.  Lateral masses of C1 appear symmetrical.  The odontoid process appears intact.  Degenerative changes throughout the cervical spine with narrowed cervical interspaces and associated endplate hypertrophic changes.  Changes are most prominent at C5-6 and C6-7 levels.  Schmorl's nodes are demonstrated at both of these levels.  There is mild irregularity of the endplates at 075-GRM which is probably degenerative in associated with Schmorl's nodes.  No vertebral compression deformities.  No prevertebral soft tissue swelling.  No focal bone lesion.  Vascular calcifications in the cervical carotid arteries.  No paraspinal soft tissue infiltration. The  IMPRESSION: Degenerative changes in the cervical spine.  No displaced fractures identified.   Original Report Authenticated By: Lucienne Capers, M.D.    Ct Cervical Spine Wo Contrast 02/02/2013  *RADIOLOGY REPORT*  Clinical Data:  The patient tripped and fell with a laceration above the right eye.  No loss of consciousness.  CT HEAD WITHOUT CONTRAST CT CERVICAL SPINE WITHOUT CONTRAST  Technique:  Multidetector CT imaging of the head and cervical spine was performed following the standard protocol without intravenous contrast.  Multiplanar CT image reconstructions of the cervical spine were also generated.  Comparison:  CT head 09/07/2012  CT HEAD  Findings: Mild diffuse cerebral atrophy.  Patchy low attenuation changes in the deep white matter consistent with small vessel ischemia.  Focal encephalomalacia  extends to the left posterior parietal convexity at the vertex is more prominent than on previous study suggesting old infarct.  Ventricles are not dilated.  Old lacunar infarcts in the basal ganglia.  No mass effect or midline shift.  No abnormal extra-axial fluid collections.  Gray-white matter junctions are distinct.  Basal cisterns are not effaced.  No depressed skull fractures.  Soft tissue injury superior to the right orbit.  Visualized paranasal sinuses and mastoid air cells are not opacified.  IMPRESSION: No acute intracranial abnormalities.  Chronic atrophy and small vessel ischemic changes with old left posterior parietal infarct.  CT CERVICAL SPINE  Findings: Normal alignment of the cervical vertebrae and facet joints.  Lateral masses of C1 appear symmetrical.  The odontoid process appears intact.  Degenerative changes throughout the cervical spine with narrowed cervical interspaces and associated endplate hypertrophic changes.  Changes are most prominent at C5-6 and C6-7 levels.  Schmorl's nodes are demonstrated at both of these levels.  There is mild irregularity of the endplates at 075-GRM which is probably degenerative in associated with Schmorl's nodes.  No vertebral compression deformities.  No prevertebral soft tissue swelling.  No focal bone lesion.  Vascular calcifications in the cervical carotid arteries.  No paraspinal soft tissue infiltration. The  IMPRESSION: Degenerative changes in the cervical spine.  No displaced fractures identified.   Original Report Authenticated By: Lucienne Capers, M.D.    Dg Knee Complete 4 Views Left 02/02/2013  *RADIOLOGY REPORT*  Clinical Data: Left knee pain following injury.  LEFT KNEE - COMPLETE 4+ VIEW  Comparison: None  Findings: There is no evidence of acute fracture or dislocation. A moderate knee effusion is present. Moderate to severe tricompartmental degenerative changes are noted. No focal bony lesions are present.  IMPRESSION: Moderate knee effusion  without evidence of acute bony abnormality.  Moderate to severe tricompartmental degenerative changes.   Original Report Authenticated By: Margarette Canada, M.D.    Dg Hand Complete Left 02/02/2013  *RADIOLOGY REPORT*  Clinical Data: Fall  LEFT HAND - COMPLETE 3+ VIEW  Comparison: None  Findings: There is a small foreign body identified within the soft tissues adjacent to the first metacarpal bone.  This measures 2-3 mm.  Mild degenerative changes involve the DIP and PIP joints.  There is no fracture or subluxation identified.  IMPRESSION: No acute bony abnormality.   Original Report Authenticated By: Kerby Moors, M.D.     2230:  Will place knee wrap, have pt f/u with PMD and Ortho MD.  Pt and family want to go home now. Abrasions are superficial; local wound care provided.  Dx and testing d/w pt and family.  Questions answered.  Verb understanding, agreeable to d/c home with outpt f/u.        Alfonzo Feller, DO 02/05/13 1753

## 2013-02-02 NOTE — Telephone Encounter (Signed)
noted 

## 2013-02-02 NOTE — ED Notes (Signed)
Laceration to lt eyebrow, pt tripped when stepping up onto concrete, struck head, no LOC. Alert, talking.

## 2013-02-02 NOTE — ED Notes (Signed)
Pt presents to laceration above right eyebrow. Pt states he tripped over curb and his head hit the concrete. Pt denies LOC. Pt also denies pain anywhere other than head. Pt is A&O. Bleeding controlled on arrival.

## 2013-02-06 DIAGNOSIS — Z0000 Encounter for general adult medical examination without abnormal findings: Secondary | ICD-10-CM | POA: Insufficient documentation

## 2013-02-06 NOTE — Assessment & Plan Note (Addendum)
annual wellness completed as documented. Pt is a high fall risk and he is unable to live independently due to limitation in mobility and being  unable to carry out all of his IADL's without asistance. His wife is espescially supportive of him, and she gets help from their children Pt is not depressed, his memory evaluation is normal. Generally he is slightly slow mentally however, this is long standing

## 2013-02-07 ENCOUNTER — Ambulatory Visit: Payer: Medicare Other | Admitting: Family Medicine

## 2013-02-17 DIAGNOSIS — I10 Essential (primary) hypertension: Secondary | ICD-10-CM | POA: Diagnosis not present

## 2013-02-17 DIAGNOSIS — M25569 Pain in unspecified knee: Secondary | ICD-10-CM | POA: Diagnosis not present

## 2013-02-22 ENCOUNTER — Telehealth: Payer: Self-pay | Admitting: Family Medicine

## 2013-02-23 NOTE — Telephone Encounter (Signed)
Celebrex 200mg  daily prescribed by Dr. Luna Glasgow and wife said that he told them to check with pcp before starting.

## 2013-02-23 NOTE — Telephone Encounter (Signed)
Patient and wife aware.

## 2013-02-23 NOTE — Telephone Encounter (Signed)
tylenol 325 mg daily, as needed, is a safer option for him since on plavix and also has kidney disease.  pls let them know

## 2013-02-24 ENCOUNTER — Telehealth: Payer: Self-pay | Admitting: Family Medicine

## 2013-02-24 NOTE — Telephone Encounter (Signed)
Spoke with wife and clarified order given on 3/20

## 2013-02-27 DIAGNOSIS — E785 Hyperlipidemia, unspecified: Secondary | ICD-10-CM | POA: Diagnosis not present

## 2013-02-27 DIAGNOSIS — M25569 Pain in unspecified knee: Secondary | ICD-10-CM | POA: Diagnosis not present

## 2013-02-27 DIAGNOSIS — E039 Hypothyroidism, unspecified: Secondary | ICD-10-CM | POA: Diagnosis not present

## 2013-02-27 DIAGNOSIS — IMO0001 Reserved for inherently not codable concepts without codable children: Secondary | ICD-10-CM | POA: Diagnosis not present

## 2013-02-27 DIAGNOSIS — I10 Essential (primary) hypertension: Secondary | ICD-10-CM | POA: Diagnosis not present

## 2013-02-28 ENCOUNTER — Ambulatory Visit: Payer: Medicare Other | Admitting: Family Medicine

## 2013-03-01 DIAGNOSIS — E039 Hypothyroidism, unspecified: Secondary | ICD-10-CM | POA: Diagnosis not present

## 2013-03-01 DIAGNOSIS — I10 Essential (primary) hypertension: Secondary | ICD-10-CM | POA: Diagnosis not present

## 2013-03-01 DIAGNOSIS — E785 Hyperlipidemia, unspecified: Secondary | ICD-10-CM | POA: Diagnosis not present

## 2013-03-01 DIAGNOSIS — IMO0001 Reserved for inherently not codable concepts without codable children: Secondary | ICD-10-CM | POA: Diagnosis not present

## 2013-03-06 DIAGNOSIS — H2510 Age-related nuclear cataract, unspecified eye: Secondary | ICD-10-CM | POA: Diagnosis not present

## 2013-03-08 ENCOUNTER — Other Ambulatory Visit: Payer: Self-pay | Admitting: Family Medicine

## 2013-03-21 DIAGNOSIS — H409 Unspecified glaucoma: Secondary | ICD-10-CM | POA: Diagnosis not present

## 2013-03-21 DIAGNOSIS — H402290 Chronic angle-closure glaucoma, unspecified eye, stage unspecified: Secondary | ICD-10-CM | POA: Diagnosis not present

## 2013-03-21 DIAGNOSIS — H2510 Age-related nuclear cataract, unspecified eye: Secondary | ICD-10-CM | POA: Diagnosis not present

## 2013-03-21 DIAGNOSIS — H538 Other visual disturbances: Secondary | ICD-10-CM | POA: Diagnosis not present

## 2013-03-28 DIAGNOSIS — I6990 Unspecified sequelae of unspecified cerebrovascular disease: Secondary | ICD-10-CM | POA: Diagnosis not present

## 2013-03-28 DIAGNOSIS — R5383 Other fatigue: Secondary | ICD-10-CM | POA: Diagnosis not present

## 2013-03-28 DIAGNOSIS — R5381 Other malaise: Secondary | ICD-10-CM | POA: Diagnosis not present

## 2013-03-28 DIAGNOSIS — R569 Unspecified convulsions: Secondary | ICD-10-CM | POA: Diagnosis not present

## 2013-03-30 ENCOUNTER — Telehealth: Payer: Self-pay | Admitting: Family Medicine

## 2013-03-30 DIAGNOSIS — M25569 Pain in unspecified knee: Secondary | ICD-10-CM | POA: Diagnosis not present

## 2013-03-30 DIAGNOSIS — I10 Essential (primary) hypertension: Secondary | ICD-10-CM | POA: Diagnosis not present

## 2013-04-03 DIAGNOSIS — H2510 Age-related nuclear cataract, unspecified eye: Secondary | ICD-10-CM | POA: Diagnosis not present

## 2013-04-03 DIAGNOSIS — H26499 Other secondary cataract, unspecified eye: Secondary | ICD-10-CM | POA: Diagnosis not present

## 2013-04-03 DIAGNOSIS — H409 Unspecified glaucoma: Secondary | ICD-10-CM | POA: Diagnosis not present

## 2013-04-03 DIAGNOSIS — H402290 Chronic angle-closure glaucoma, unspecified eye, stage unspecified: Secondary | ICD-10-CM | POA: Diagnosis not present

## 2013-04-05 NOTE — Telephone Encounter (Signed)
Noted  

## 2013-04-05 NOTE — Telephone Encounter (Signed)
Wife would like to know if patient should still be on glipizide.  Has not been filled since March of 2013.  I don't see where he should be on this but will you please clarify.

## 2013-04-05 NOTE — Telephone Encounter (Signed)
Wife aware

## 2013-04-05 NOTE — Telephone Encounter (Signed)
If none since March I do not believe this is there case. She needs to contact Dr Dorris Fetch  Who is treating his diabetes to be sure

## 2013-04-13 ENCOUNTER — Other Ambulatory Visit: Payer: Self-pay | Admitting: Family Medicine

## 2013-04-14 DIAGNOSIS — E119 Type 2 diabetes mellitus without complications: Secondary | ICD-10-CM | POA: Diagnosis not present

## 2013-04-14 DIAGNOSIS — E1159 Type 2 diabetes mellitus with other circulatory complications: Secondary | ICD-10-CM | POA: Diagnosis not present

## 2013-04-27 DIAGNOSIS — E785 Hyperlipidemia, unspecified: Secondary | ICD-10-CM | POA: Diagnosis not present

## 2013-04-27 DIAGNOSIS — I10 Essential (primary) hypertension: Secondary | ICD-10-CM | POA: Diagnosis not present

## 2013-04-27 DIAGNOSIS — E039 Hypothyroidism, unspecified: Secondary | ICD-10-CM | POA: Diagnosis not present

## 2013-04-27 DIAGNOSIS — IMO0001 Reserved for inherently not codable concepts without codable children: Secondary | ICD-10-CM | POA: Diagnosis not present

## 2013-05-04 DIAGNOSIS — E1129 Type 2 diabetes mellitus with other diabetic kidney complication: Secondary | ICD-10-CM | POA: Diagnosis not present

## 2013-05-04 DIAGNOSIS — E038 Other specified hypothyroidism: Secondary | ICD-10-CM | POA: Diagnosis not present

## 2013-05-04 DIAGNOSIS — I10 Essential (primary) hypertension: Secondary | ICD-10-CM | POA: Diagnosis not present

## 2013-05-04 DIAGNOSIS — E785 Hyperlipidemia, unspecified: Secondary | ICD-10-CM | POA: Diagnosis not present

## 2013-05-04 DIAGNOSIS — E669 Obesity, unspecified: Secondary | ICD-10-CM | POA: Diagnosis not present

## 2013-05-04 DIAGNOSIS — E1165 Type 2 diabetes mellitus with hyperglycemia: Secondary | ICD-10-CM | POA: Diagnosis not present

## 2013-05-10 ENCOUNTER — Telehealth: Payer: Self-pay | Admitting: Family Medicine

## 2013-05-10 NOTE — Telephone Encounter (Signed)
Pt may get a note stating the date of his last OV, not sure at all this will help him, not even sure this is his request or that of the DMV His ability to drive with the h/o seizures will also require documentation/release from Dr Merlene Laughter, pls remind him of this. I would suggest you ask him to get a letter of request from the [facility] re the letter he is asking for , because I doubt they want /need that info

## 2013-05-11 NOTE — Telephone Encounter (Signed)
Called and left msg for patient to return call.

## 2013-05-11 NOTE — Telephone Encounter (Signed)
Spoke with wife and she states she will call back if patient need letter.

## 2013-05-12 DIAGNOSIS — H2510 Age-related nuclear cataract, unspecified eye: Secondary | ICD-10-CM | POA: Diagnosis not present

## 2013-05-12 DIAGNOSIS — H34219 Partial retinal artery occlusion, unspecified eye: Secondary | ICD-10-CM | POA: Diagnosis not present

## 2013-05-12 DIAGNOSIS — H402290 Chronic angle-closure glaucoma, unspecified eye, stage unspecified: Secondary | ICD-10-CM | POA: Diagnosis not present

## 2013-05-12 DIAGNOSIS — H353190 Nonexudative age-related macular degeneration, unspecified eye, stage unspecified: Secondary | ICD-10-CM | POA: Diagnosis not present

## 2013-05-12 DIAGNOSIS — H409 Unspecified glaucoma: Secondary | ICD-10-CM | POA: Diagnosis not present

## 2013-05-12 DIAGNOSIS — H35039 Hypertensive retinopathy, unspecified eye: Secondary | ICD-10-CM | POA: Diagnosis not present

## 2013-05-15 ENCOUNTER — Telehealth: Payer: Self-pay | Admitting: Family Medicine

## 2013-05-16 NOTE — Telephone Encounter (Signed)
Stopped by the office yesterday and I showed her the cholesterol med he was taking and that the eye Dr gave him an omega 3 vitamin and I told her that was ok as well

## 2013-05-24 ENCOUNTER — Telehealth: Payer: Self-pay | Admitting: Family Medicine

## 2013-05-24 DIAGNOSIS — I2510 Atherosclerotic heart disease of native coronary artery without angina pectoris: Secondary | ICD-10-CM

## 2013-05-24 NOTE — Telephone Encounter (Signed)
Appointment scheduled with [facility] for July 8th @ 0900

## 2013-05-24 NOTE — Telephone Encounter (Signed)
Pls let pt know that I recently heard from his eye specialist who recommends a cardiology evaluation based on eye findings. Since he has not been to cardiology for years, and is high risk, for CVD at the request of the eye specialist I am referring him for cardiac eval  Also needs to schedule appt in this office in July

## 2013-05-25 ENCOUNTER — Telehealth: Payer: Self-pay | Admitting: Family Medicine

## 2013-05-25 NOTE — Telephone Encounter (Signed)
Called to schedule appt but noted that patient has appt for 7/7

## 2013-05-25 NOTE — Telephone Encounter (Signed)
Patient has appt for 7/7 at this practice.

## 2013-06-01 DIAGNOSIS — I10 Essential (primary) hypertension: Secondary | ICD-10-CM | POA: Diagnosis not present

## 2013-06-01 DIAGNOSIS — M25569 Pain in unspecified knee: Secondary | ICD-10-CM | POA: Diagnosis not present

## 2013-06-05 ENCOUNTER — Other Ambulatory Visit: Payer: Self-pay | Admitting: Family Medicine

## 2013-06-12 ENCOUNTER — Ambulatory Visit: Payer: Medicare Other | Admitting: Family Medicine

## 2013-06-13 ENCOUNTER — Ambulatory Visit (INDEPENDENT_AMBULATORY_CARE_PROVIDER_SITE_OTHER): Payer: Medicare Other | Admitting: Cardiovascular Disease

## 2013-06-13 ENCOUNTER — Encounter: Payer: Self-pay | Admitting: Cardiovascular Disease

## 2013-06-13 VITALS — BP 154/75 | HR 63 | Ht 68.0 in | Wt 192.0 lb

## 2013-06-13 DIAGNOSIS — H34219 Partial retinal artery occlusion, unspecified eye: Secondary | ICD-10-CM | POA: Diagnosis not present

## 2013-06-13 DIAGNOSIS — I739 Peripheral vascular disease, unspecified: Secondary | ICD-10-CM

## 2013-06-13 DIAGNOSIS — E7800 Pure hypercholesterolemia, unspecified: Secondary | ICD-10-CM

## 2013-06-13 DIAGNOSIS — H34212 Partial retinal artery occlusion, left eye: Secondary | ICD-10-CM

## 2013-06-13 DIAGNOSIS — I10 Essential (primary) hypertension: Secondary | ICD-10-CM

## 2013-06-13 MED ORDER — CHLORTHALIDONE 25 MG PO TABS: 12.5000 mg | ORAL_TABLET | Freq: Every day | ORAL | Status: DC

## 2013-06-13 NOTE — Progress Notes (Signed)
Patient ID: Chase [name], male   DOB: 02/12/1931, 79 y.o.   MRN: [id number]    CARDIOLOGY CONSULT NOTE  Patient ID: Chase [name] MRN: [id number] DOB/AGE: 10-01-1931 79 y.o.  Admit date: (Not on file) Primary Physician: Chase Nakayama, MD Reason for Consultation: suspicious eye findings, HTN, PVD, Hypercholesterolemia  HPI:  Chase [name] is a 79 y.o. Man with an extensive PMH which includes CVA, CKD, hypothyroidism, HTN, hypercholesterolemia, and IDDM. His most recent echocardiogram performed on 09-08-2012 revealed the following:  - Left ventricle: The cavity size was normal. There was   moderate to severe concentric hypertrophy. Systolic   function was vigorous. The estimated ejection fraction was   in the range of 65% to 70%. Wall motion was normal. - Aortic valve: Mildly to moderately calcified annulus.   Mildly thickened leaflets. - Mitral valve: Calcified annulus. - Left atrium: The atrium was mildly to moderately dilated. - Right ventricle: The cavity size was normal. Wall   thickness was increased. - Right atrium: The atrium was mildly dilated. - Pericardium, extracardiac: A trivial pericardial effusion   was identified posterior to the heart.  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 There was also significant arteriolar sclerosis of the retinal arteries and moderate hypertensive retinopathy.  He denies any constant chest pain, and seldom gets "chest twinges" lasting for a second. He denies shortness of breath, but does get fatigued rather easily. He also denies palpitations. He says he dozes off rather quickly as well. He denies lightheadedness and dizziness.  He used to be on a baby ASA, but someone discontinued it.   Review of systems complete and found to be negative unless listed above in HPI  Past Medical History: see HPI SocHx: retired from [employer] in  [place], had done so for 30.5 years. Has 6 children, 10 grandchildren and 57 great grandchildren. No tobacco or alcohol use.   Family History  Problem Relation Age of Onset  . Diabetes Mother   . Prostate cancer Father   . Diabetes Sister   . Diabetes Brother   . Diabetes Brother   . Hypertension Brother   . Hypertension Brother   . Hypertension Brother     History   Social History  . Marital Status: Married    Spouse Name: N/A    Number of Children: 73  . Years of Education: N/A   Occupational History  . retired     Social History Main Topics  . Smoking status: Never Smoker   . Smokeless tobacco: Not on file  . Alcohol Use: No  . Drug Use: No  . Sexually Active: No   Other Topics Concern  . Not on file   Social History Narrative  . No narrative on file      (Not in a hospital admission)  BP: 154/75 mmHg Physical exam There were no vitals taken for this visit. General: NAD Neck: No JVD, no thyromegaly or thyroid nodule.  Lungs: Clear to auscultation bilaterally with normal respiratory effort. CV: Nondisplaced PMI.  Heart regular S1/S2, no S3/S4, no murmur.  No peripheral edema.  No carotid bruit.  Normal pedal pulses.  Abdomen: Soft, nontender, no hepatosplenomegaly, no distention.  Skin: Intact without lesions or rashes.  Neurologic: Alert and oriented x 3.  Psych: Normal affect. Extremities: No clubbing or cyanosis.  HEENT: Normal.   Labs:   Lab Results  Component Value Date  WBC 7.1 09/07/2012   HGB 9.8* 09/07/2012   HCT 29.6* 09/07/2012   MCV 95.8 09/07/2012   PLT 209 09/07/2012   No results found for this basename: NA, K, CL, CO2, BUN, CREATININE, CALCIUM, LABALBU, PROT, BILITOT, ALKPHOS, ALT, AST, GLUCOSE,  in the last 168 hours Lab Results  Component Value Date   CKTOTAL 195 03/14/2012   CKMB 5.8* 03/14/2012   TROPONINI <0.30 09/07/2012    Lab Results  Component Value Date   CHOL 182 09/08/2012   CHOL 294* 06/28/2012   CHOL 223* 03/15/2012    Lab Results  Component Value Date   HDL 69 09/08/2012   HDL 78 06/28/2012   HDL 88 03/15/2012   Lab Results  Component Value Date   LDLCALC 100* 09/08/2012   LDLCALC 201* 06/28/2012   LDLCALC 122* 03/15/2012   Lab Results  Component Value Date   TRIG 66 09/08/2012   TRIG 76 06/28/2012   TRIG 67 03/15/2012   Lab Results  Component Value Date   CHOLHDL 2.6 09/08/2012   CHOLHDL 3.8 06/28/2012   CHOLHDL 2.5 03/15/2012   No results found for this basename: LDLDIRECT       EKG: Sinus rhythm, rate 65 bpm, LAFB, late R wave transition representing possible old anterior wall infarction, mild NSST-T abnormality  ECHO: see HPI   ASSESSMENT AND PLAN:  1. Essential HTN: Rather than increasing his Hydralazine, I will add Chlorthalidone 12.5 mg daily with a f/u BMP, given his h/o CKD. Given the degree of left ventricular hypertrophy and moderate hypertensive retinopathy, I suspect his HTN is chronic and suboptimally controlled. Continue Amlodipine. 2. Left retinal arteriole cholesterol embolus: I will add ASA 81 mg daily, in addition to his Plavix. Will check carotid Dopplers, as agreed upon by Dr. Herbert [name], his ophthalmologist. 3. Hypercholesterolemia: will check a lipid panel, as it doesn't appear one has been done recently. Continue Lovastatin 40 mg daily for now. 4. CKD: given diuretic initiation, monitor with a BMP in one week.  Signed: Kate [name], M.D., F.A.C.C. 06/13/2013, 9:17 AM

## 2013-06-13 NOTE — Patient Instructions (Addendum)
Your physician recommends that you schedule a follow-up appointment in: [place] physician has recommended you make the following change in your medication:  1) START CHLORTHALIDONE 12.5MG  ONCE DAILY 2) START ASPIRIN 81MG  ONCE DAILY  Your physician recommends that you return for lab work in: [place] ([place] [facility])   Your physician has requested that you have a carotid duplex. This test is an ultrasound of the carotid arteries in your neck. It looks at blood flow through these arteries that supply the brain with blood. Allow one hour for this exam. There are no restrictions or special instructions.

## 2013-06-16 ENCOUNTER — Ambulatory Visit (INDEPENDENT_AMBULATORY_CARE_PROVIDER_SITE_OTHER): Payer: Medicare Other | Admitting: Family Medicine

## 2013-06-16 ENCOUNTER — Encounter: Payer: Self-pay | Admitting: Family Medicine

## 2013-06-16 VITALS — BP 160/80 | HR 77 | Resp 16 | Ht 67.0 in | Wt 193.1 lb

## 2013-06-16 DIAGNOSIS — E1129 Type 2 diabetes mellitus with other diabetic kidney complication: Secondary | ICD-10-CM | POA: Diagnosis not present

## 2013-06-16 DIAGNOSIS — Z9119 Patient's noncompliance with other medical treatment and regimen: Secondary | ICD-10-CM

## 2013-06-16 DIAGNOSIS — IMO0002 Reserved for concepts with insufficient information to code with codable children: Secondary | ICD-10-CM

## 2013-06-16 DIAGNOSIS — D649 Anemia, unspecified: Secondary | ICD-10-CM | POA: Diagnosis not present

## 2013-06-16 DIAGNOSIS — E785 Hyperlipidemia, unspecified: Secondary | ICD-10-CM | POA: Diagnosis not present

## 2013-06-16 DIAGNOSIS — E1165 Type 2 diabetes mellitus with hyperglycemia: Secondary | ICD-10-CM | POA: Diagnosis not present

## 2013-06-16 DIAGNOSIS — Z1211 Encounter for screening for malignant neoplasm of colon: Secondary | ICD-10-CM | POA: Diagnosis not present

## 2013-06-16 DIAGNOSIS — I10 Essential (primary) hypertension: Secondary | ICD-10-CM | POA: Diagnosis not present

## 2013-06-16 DIAGNOSIS — Z91199 Patient's noncompliance with other medical treatment and regimen due to unspecified reason: Secondary | ICD-10-CM

## 2013-06-16 DIAGNOSIS — N058 Unspecified nephritic syndrome with other morphologic changes: Secondary | ICD-10-CM

## 2013-06-16 DIAGNOSIS — G40209 Localization-related (focal) (partial) symptomatic epilepsy and epileptic syndromes with complex partial seizures, not intractable, without status epilepticus: Secondary | ICD-10-CM

## 2013-06-16 DIAGNOSIS — E039 Hypothyroidism, unspecified: Secondary | ICD-10-CM

## 2013-06-16 LAB — IRON: Iron: 61 ug/dL (ref 42–165)

## 2013-06-16 LAB — COMPLETE METABOLIC PANEL WITH GFR
ALT: 13 U/L (ref 0–53)
AST: 18 U/L (ref 0–37)
Albumin: 4.1 g/dL (ref 3.5–5.2)
Alkaline Phosphatase: 79 U/L (ref 39–117)
BUN: 30 mg/dL — ABNORMAL HIGH (ref 6–23)
CO2: 24 mEq/L (ref 19–32)
Calcium: 9.5 mg/dL (ref 8.4–10.5)
Chloride: 108 mEq/L (ref 96–112)
Creat: 2.38 mg/dL — ABNORMAL HIGH (ref 0.50–1.35)
GFR, Est African American: 29 mL/min — ABNORMAL LOW
GFR, Est Non African American: 25 mL/min — ABNORMAL LOW
Glucose, Bld: 99 mg/dL (ref 70–99)
Potassium: 4.9 mEq/L (ref 3.5–5.3)
Sodium: 137 mEq/L (ref 135–145)
Total Bilirubin: 0.4 mg/dL (ref 0.3–1.2)
Total Protein: 7 g/dL (ref 6.0–8.3)

## 2013-06-16 LAB — CBC
HCT: 32.7 % — ABNORMAL LOW (ref 39.0–52.0)
Hemoglobin: 10.8 g/dL — ABNORMAL LOW (ref 13.0–17.0)
MCH: 30.1 pg (ref 26.0–34.0)
MCHC: 33 g/dL (ref 30.0–36.0)
MCV: 91.1 fL (ref 78.0–100.0)
Platelets: 242 10*3/uL (ref 150–400)
RBC: 3.59 MIL/uL — ABNORMAL LOW (ref 4.22–5.81)
RDW: 13.8 % (ref 11.5–15.5)
WBC: 7.4 10*3/uL (ref 4.0–10.5)

## 2013-06-16 LAB — LIPID PANEL
Cholesterol: 161 mg/dL (ref 0–200)
HDL: 66 mg/dL (ref >39)
LDL Cholesterol: 87 mg/dL (ref 0–99)
Total CHOL/HDL Ratio: 2.4 Ratio
Triglycerides: 39 mg/dL (ref <150)
VLDL: 8 mg/dL (ref 0–40)

## 2013-06-16 NOTE — Patient Instructions (Addendum)
F/u in October, call if you need me before  CBC, iron, fasting lipid, cmp and eGFR today  Please stop using salt and canned foods, smoked meats, this will help blood pressure and also help to protect the kidneys from further damage   You are referred for colonoscopy due to anemia

## 2013-06-17 NOTE — Assessment & Plan Note (Signed)
Marked improvement in the past 1 year

## 2013-06-17 NOTE — Assessment & Plan Note (Signed)
Controlled, no change in medication Followed by endo

## 2013-06-17 NOTE — Assessment & Plan Note (Signed)
Likely multifactorial, esp due to CKD. No record or recall of colonoscopy, referred for same

## 2013-06-17 NOTE — Assessment & Plan Note (Signed)
Controlled, no change in medication Hyperlipidemia:Low fat diet discussed and encouraged.  \ 

## 2013-06-17 NOTE — Progress Notes (Signed)
  Subjective:    Patient ID: Chase [name], male    DOB: [birth year], 79 y.o.   MRN: [id number]  HPI The PT is here for follow up and re-evaluation of chronic medical conditions, medication management and review of any available recent lab and radiology data.  Preventive health is updated, specifically  Cancer screening and Immunization.   Questions or concerns regarding consultations or procedures which the PT has had in the interim are  Addressed.Vision is detreriorating, surgery recommended for his glaucoma, he is still considering this The PT denies any adverse reactions to current medications since the last visit.  C/o chronic fatigue, no record or recall of colonoscopy .        Review of Systems    See HPI Denies recent fever or chills. Denies sinus pressure, nasal congestion, ear pain or sore throat. Denies chest congestion, productive cough or wheezing. Denies chest pains, palpitations and leg swelling Denies abdominal pain, nausea, vomiting,diarrhea or constipation.   Denies dysuria, frequency, hesitancy or incontinence. C/o chronic  joint pain, stiffness and limitation in mobility. Denies headaches, seizures, numbness, or tingling. Denies depression, anxiety or insomnia. Denies skin break down or rash.     Objective:   Physical Exam  Patient alert and oriented and in no cardiopulmonary distress.  HEENT: No facial asymmetry, EOMI, no sinus tenderness,  oropharynx pink and moist.  Neck supple no adenopathy.  Chest: Clear to auscultation bilaterally.  CVS: S1, S2 no murmurs, no S3.  ABD: Soft non tender. Bowel sounds normal.  Ext: No edema  MS: decreased  ROM spine,adequate in  shoulders, hips and knees.  Skin: Intact, no ulcerations or rash noted.  Psych: Good eye contact, flat  affect. Memory impaired not anxious or depressed appearing.  CNS: CN 2-12 intact,       Assessment & Plan:

## 2013-06-17 NOTE — Assessment & Plan Note (Signed)
Uncontrolled, high salt intake and consistency in taking meds at same time questionable, educated about importance of same , no med change at this time

## 2013-06-17 NOTE — Assessment & Plan Note (Signed)
Improved, managed by endo

## 2013-06-17 NOTE — Assessment & Plan Note (Signed)
Controlled, followed by neurology, reportedly , now permitted to drive

## 2013-06-20 DIAGNOSIS — H409 Unspecified glaucoma: Secondary | ICD-10-CM | POA: Diagnosis not present

## 2013-06-20 DIAGNOSIS — H353190 Nonexudative age-related macular degeneration, unspecified eye, stage unspecified: Secondary | ICD-10-CM | POA: Diagnosis not present

## 2013-06-20 DIAGNOSIS — H35039 Hypertensive retinopathy, unspecified eye: Secondary | ICD-10-CM | POA: Diagnosis not present

## 2013-06-20 DIAGNOSIS — H2510 Age-related nuclear cataract, unspecified eye: Secondary | ICD-10-CM | POA: Diagnosis not present

## 2013-06-20 DIAGNOSIS — H402290 Chronic angle-closure glaucoma, unspecified eye, stage unspecified: Secondary | ICD-10-CM | POA: Diagnosis not present

## 2013-06-20 DIAGNOSIS — E11319 Type 2 diabetes mellitus with unspecified diabetic retinopathy without macular edema: Secondary | ICD-10-CM | POA: Diagnosis not present

## 2013-06-21 ENCOUNTER — Telehealth: Payer: Self-pay | Admitting: Family Medicine

## 2013-06-21 DIAGNOSIS — E7800 Pure hypercholesterolemia, unspecified: Secondary | ICD-10-CM | POA: Diagnosis not present

## 2013-06-21 DIAGNOSIS — H34219 Partial retinal artery occlusion, unspecified eye: Secondary | ICD-10-CM | POA: Diagnosis not present

## 2013-06-21 DIAGNOSIS — I10 Essential (primary) hypertension: Secondary | ICD-10-CM | POA: Diagnosis not present

## 2013-06-21 DIAGNOSIS — I739 Peripheral vascular disease, unspecified: Secondary | ICD-10-CM | POA: Diagnosis not present

## 2013-06-21 LAB — BASIC METABOLIC PANEL
BUN: 26 mg/dL — ABNORMAL HIGH (ref 6–23)
CO2: 23 mEq/L (ref 19–32)
Calcium: 9.4 mg/dL (ref 8.4–10.5)
Chloride: 107 mEq/L (ref 96–112)
Creat: 2.21 mg/dL — ABNORMAL HIGH (ref 0.50–1.35)
Glucose, Bld: 85 mg/dL (ref 70–99)
Potassium: 4.9 mEq/L (ref 3.5–5.3)
Sodium: 140 mEq/L (ref 135–145)

## 2013-06-21 LAB — LIPID PANEL
Cholesterol: 156 mg/dL (ref 0–200)
HDL: 59 mg/dL (ref >39)
LDL Cholesterol: 86 mg/dL (ref 0–99)
Total CHOL/HDL Ratio: 2.6 Ratio
Triglycerides: 53 mg/dL (ref <150)
VLDL: 11 mg/dL (ref 0–40)

## 2013-06-23 DIAGNOSIS — E119 Type 2 diabetes mellitus without complications: Secondary | ICD-10-CM | POA: Diagnosis not present

## 2013-06-23 DIAGNOSIS — E1159 Type 2 diabetes mellitus with other circulatory complications: Secondary | ICD-10-CM | POA: Diagnosis not present

## 2013-06-23 NOTE — Telephone Encounter (Signed)
Called patient and left message for them to return call at the office   

## 2013-06-26 ENCOUNTER — Other Ambulatory Visit: Payer: Self-pay | Admitting: Family Medicine

## 2013-06-26 NOTE — Telephone Encounter (Signed)
Wanted to know where they could find specific sample vitamin given from the eye Dr. Durward Fortes her to call back and ask them and she said she would. I was not familiar with the vitamin

## 2013-06-28 ENCOUNTER — Encounter (INDEPENDENT_AMBULATORY_CARE_PROVIDER_SITE_OTHER): Payer: Self-pay | Admitting: *Deleted

## 2013-07-13 DIAGNOSIS — I10 Essential (primary) hypertension: Secondary | ICD-10-CM | POA: Diagnosis not present

## 2013-07-13 DIAGNOSIS — M25569 Pain in unspecified knee: Secondary | ICD-10-CM | POA: Diagnosis not present

## 2013-07-17 ENCOUNTER — Telehealth: Payer: Self-pay | Admitting: Family Medicine

## 2013-07-20 ENCOUNTER — Ambulatory Visit (INDEPENDENT_AMBULATORY_CARE_PROVIDER_SITE_OTHER): Payer: Medicare Other | Admitting: Internal Medicine

## 2013-07-20 ENCOUNTER — Telehealth (INDEPENDENT_AMBULATORY_CARE_PROVIDER_SITE_OTHER): Payer: Self-pay | Admitting: *Deleted

## 2013-07-20 ENCOUNTER — Other Ambulatory Visit (INDEPENDENT_AMBULATORY_CARE_PROVIDER_SITE_OTHER): Payer: Self-pay | Admitting: *Deleted

## 2013-07-20 ENCOUNTER — Encounter (INDEPENDENT_AMBULATORY_CARE_PROVIDER_SITE_OTHER): Payer: Self-pay | Admitting: Internal Medicine

## 2013-07-20 VITALS — BP 172/74 | HR 56 | Temp 98.0°F | Ht 67.0 in | Wt 193.2 lb

## 2013-07-20 DIAGNOSIS — D649 Anemia, unspecified: Secondary | ICD-10-CM

## 2013-07-20 DIAGNOSIS — E785 Hyperlipidemia, unspecified: Secondary | ICD-10-CM | POA: Diagnosis not present

## 2013-07-20 DIAGNOSIS — E039 Hypothyroidism, unspecified: Secondary | ICD-10-CM | POA: Diagnosis not present

## 2013-07-20 DIAGNOSIS — Z1211 Encounter for screening for malignant neoplasm of colon: Secondary | ICD-10-CM

## 2013-07-20 DIAGNOSIS — IMO0001 Reserved for inherently not codable concepts without codable children: Secondary | ICD-10-CM | POA: Diagnosis not present

## 2013-07-20 MED ORDER — PEG-KCL-NACL-NASULF-NA ASC-C 100 G PO SOLR: 1.0000 | Freq: Once | ORAL | Status: DC

## 2013-07-20 NOTE — Progress Notes (Addendum)
Subjective:     Patient ID: Chase [name], male   DOB: 16-Jan-1931, 79 y.o.   MRN: [id number]  HPI Referred to our office for anemia for anemia/colonoscopy.. Noted on 06/16/2013 his hemoglobin was 10.8.  He says he is tired a lot of times. His skin is pale. His appetite is good.  He thinks he has lost about 15 pounds over the past year which was unintentional. There is no abdominal pain. He usually has a BM about every 2 days. Stools are brown in color.  No bright red rectal bleeding or melena. His wife tells me he has hx of seizures and is maintained on Keppra for this. He actually had one seizure in March of 2013. Taken to the ED. No family hx of colon cancer. Hx of diabetes greater than 25 yrs. Hx of Hypertension 15-11yrs. Hx of CKD and has seen Dr. [name] in the past, but has not seen him in over 2 yrs. Hx of CVA in 2013 and maintained on Plavix. He has never undergone a colonoscopy  Iron 61.  CMP     Component Value Date/Time   NA 140 06/21/2013 1020   K 4.9 06/21/2013 1020   CL 107 06/21/2013 1020   CO2 23 06/21/2013 1020   GLUCOSE 85 06/21/2013 1020   BUN 26* 06/21/2013 1020   CREATININE 2.21* 06/21/2013 1020   CREATININE 2.23* 09/07/2012 1425   CALCIUM 9.4 06/21/2013 1020   PROT 7.0 06/16/2013 0957   ALBUMIN 4.1 06/16/2013 0957   AST 18 06/16/2013 0957   ALT 13 06/16/2013 0957   ALKPHOS 79 06/16/2013 0957   BILITOT 0.4 06/16/2013 0957   GFRNONAA 26* 09/07/2012 1425   GFRAA 31* 09/07/2012 1425     CBC    Component Value Date/Time   WBC 7.4 06/16/2013 0957   RBC 3.59* 06/16/2013 0957   HGB 10.8* 06/16/2013 0957   HCT 32.7* 06/16/2013 0957   PLT 242 06/16/2013 0957   MCV 91.1 06/16/2013 0957   MCH 30.1 06/16/2013 0957   MCHC 33.0 06/16/2013 0957   RDW 13.8 06/16/2013 0957   LYMPHSABS 1.7 09/07/2012 1425   MONOABS 0.6 09/07/2012 1425   EOSABS 0.3 09/07/2012 1425   BASOSABS 0.0 09/07/2012 1425  08/10/2012 10.6 09/07/12 9.8 50yrs ago: H and H 13.0 and 39.6    Review of Systemssee  hpi  Current Outpatient Prescriptions  Medication Sig Dispense Refill  . ALPHAGAN P 0.1 % SOLN       . amLODipine (NORVASC) 10 MG tablet TAKE ONE TABLET BY MOUTH EVERY DAY  30 tablet  3  . aspirin EC 81 MG tablet Take 1 tablet (81 mg total) by mouth daily.  150 tablet  2  . clopidogrel (PLAVIX) 75 MG tablet TAKE ONE TABLET BY MOUTH IN THE MORNING WITH BREAKFAST  30 tablet  3  . hydrALAZINE (APRESOLINE) 50 MG tablet Take 25 mg by mouth 2 (two) times daily.      . insulin lispro protamine-insulin lispro (HUMALOG 75/25) (75-25) 100 UNIT/ML SUSP Inject 15 Units into the skin 2 (two) times daily with a meal.      . levETIRAcetam (KEPPRA) 500 MG tablet Take 500 mg by mouth at bedtime. Take ONE_HALF TAB BY MOUTH AT BEDTIME      . levothyroxine (SYNTHROID, LEVOTHROID) 150 MCG tablet TAKE ONE TABLET BY MOUTH EVERY DAY  30 tablet  2  . Levothyroxine Sodium 150 MCG CAPS Take 150 mcg by mouth every morning. FOR THYROID      .  lovastatin (MEVACOR) 40 MG tablet TAKE ONE TABLET BY MOUTH AT BEDTIME  30 tablet  2  . Multiple Vitamins-Minerals (ICAPS MV PO) Take by mouth.       No current facility-administered medications for this visit.   Past Medical History  Diagnosis Date  . Diabetes mellitus   . Hypothyroidism   . Hypertension   . Peripheral vascular disease, unspecified   . Unspecified hypothyroidism   . Depressive disorder, not elsewhere classified   . Obesity   . Other and unspecified hyperlipidemia   . Unspecified essential hypertension   . Pain in joint, lower leg   . Osteoarthrosis, unspecified whether generalized or localized, lower leg   . Derangement of meniscus, not elsewhere classified   . Lumbago   . Spondylosis of unspecified site without mention of myelopathy   . Spinal stenosis, unspecified region other than cervical   . Backache, unspecified   . Family history of diabetes mellitus   . Seizures   . Complete lesion of cervical spinal cord 03/14/3012    Stable since 2006  .  Lacunar stroke, acute 03/14/2012  . Bradycardia 03/15/2012  . Gait disorder   . Knee contracture   . Diabetic neuropathy   . CVA (cerebrovascular accident) 09/07/12  . CKD (chronic kidney disease) stage 3, GFR 30-59 ml/min   . CKD (chronic kidney disease) stage 4, GFR 15-29 ml/min    Past Surgical History  Procedure Laterality Date  . Kidney stones left x2  1975  . Kidney surgery      Ruptured left kidney 30 yrs ago   Family History  Problem Relation Age of Onset  . Diabetes Mother   . Prostate cancer Father   . Diabetes Brother   . Diabetes Brother   . Hypertension Brother   . Hypertension Brother   . Hypertension Brother      Allergies  Allergen Reactions  . Bayer Advanced Aspirin [Aspirin] Nausea And Vomiting  . Penicillins Nausea And Vomiting       Objective:   Physical Exam  Filed Vitals:   07/20/13 1508  BP: 172/74  Pulse: 56  Temp: 98 F (36.7 C)  Height: 5\' 7"  (1.702 m)  Weight: 193 lb 3.2 oz (87.635 kg)   Alert and oriented. Skin warm and dry. Skin is pale.  Oral mucosa is moist.   . Sclera anicteric, conjunctivae is pink. Thyroid not enlarged. No cervical lymphadenopathy. Lungs clear. Heart regular rate and rhythm.  Abdomen is soft. Bowel sounds are positive. No hepatomegaly. No abdominal masses felt. No tenderness.  2+ edema to lower extremities.  Walks with a walker.     Assessment:    Anemia. In need of screening colonoscopy  . Colonic neoplasm needs to be ruled out as well as AVM, polyp.  Also colonic ulcer in the differential.     Plan:    Colonoscopy with Dr. Laural Golden. He was also advised to follow up with his nephrologist concerning his kidney function.

## 2013-07-20 NOTE — Telephone Encounter (Signed)
Patient needs movi prep 

## 2013-07-20 NOTE — Patient Instructions (Addendum)
Colonoscopy with Dr. Rehman 

## 2013-07-21 ENCOUNTER — Encounter (INDEPENDENT_AMBULATORY_CARE_PROVIDER_SITE_OTHER): Payer: Self-pay | Admitting: Internal Medicine

## 2013-07-21 NOTE — Telephone Encounter (Signed)
As far as I am, aware, since he has seizures, final decision re his ability to drive [place] through [place], which we have discussed repeatedly, so I believe that this letter needs to be from his neurologist, just to let you know some background info. Will wait to hear more detail when available

## 2013-07-21 NOTE — Telephone Encounter (Signed)
Wife in to the office on 8/14 stating that patient needs a letter in order to keep license.   Wife did not have letter with her but will call back and read letter.

## 2013-07-25 NOTE — Telephone Encounter (Signed)
I have copied the flag I sent to Dr Merlene Laughter, Pls also type , fax over and let staff/nurse know to expect it so msg is relayed. Ok to stamp note pls

## 2013-07-25 NOTE — Telephone Encounter (Signed)
Called and spoke with [employer] that confirmed that patient needs a medical review packet completed.  Representative stated that she would need to verify if she is able to fax the required papers to this office.  If not able to fax she will mail to patient home.  Deadline is 09/09/2013 to have papers turned in.  If another extension is needed this will have to requested in writing from the physician.

## 2013-07-25 NOTE — Telephone Encounter (Signed)
Again this pt ABSOLUTELY NEEDS to have the neurologist treating his seizure disorder involved in determining his ability to safely drive.  I am UNABLE to do this without a statement  from Dr Merlene Laughter. Pt needs to schedule an appointment to see Dr Merlene Laughter about this and get this aaddressed I will send Dr Merlene Laughter a msg making him aware of situation that is ALL I CAN DO at this  Time, pls let them know and get them to understand please

## 2013-07-25 NOTE — Telephone Encounter (Signed)
Noted  

## 2013-07-26 ENCOUNTER — Telehealth: Payer: Self-pay | Admitting: Family Medicine

## 2013-07-26 ENCOUNTER — Other Ambulatory Visit: Payer: Self-pay

## 2013-07-26 ENCOUNTER — Encounter (HOSPITAL_COMMUNITY): Payer: Self-pay | Admitting: Pharmacy Technician

## 2013-07-26 MED ORDER — HYDRALAZINE HCL 50 MG PO TABS: ORAL_TABLET | ORAL | Status: DC

## 2013-07-27 DIAGNOSIS — E039 Hypothyroidism, unspecified: Secondary | ICD-10-CM | POA: Diagnosis not present

## 2013-07-27 DIAGNOSIS — I10 Essential (primary) hypertension: Secondary | ICD-10-CM | POA: Diagnosis not present

## 2013-07-27 DIAGNOSIS — E669 Obesity, unspecified: Secondary | ICD-10-CM | POA: Diagnosis not present

## 2013-07-27 DIAGNOSIS — E785 Hyperlipidemia, unspecified: Secondary | ICD-10-CM | POA: Diagnosis not present

## 2013-07-27 DIAGNOSIS — IMO0001 Reserved for inherently not codable concepts without codable children: Secondary | ICD-10-CM | POA: Diagnosis not present

## 2013-07-28 NOTE — Telephone Encounter (Signed)
SPOKE WITH WIFE AND SHE BROUGHT IN A FORM FROM THE [employer] TO BE COMPLETED

## 2013-08-01 DIAGNOSIS — G40909 Epilepsy, unspecified, not intractable, without status epilepticus: Secondary | ICD-10-CM | POA: Diagnosis not present

## 2013-08-01 DIAGNOSIS — M159 Polyosteoarthritis, unspecified: Secondary | ICD-10-CM | POA: Diagnosis not present

## 2013-08-01 DIAGNOSIS — M25519 Pain in unspecified shoulder: Secondary | ICD-10-CM | POA: Diagnosis not present

## 2013-08-01 DIAGNOSIS — Z79899 Other long term (current) drug therapy: Secondary | ICD-10-CM | POA: Diagnosis not present

## 2013-08-04 ENCOUNTER — Other Ambulatory Visit: Payer: Self-pay

## 2013-08-04 MED ORDER — AMLODIPINE BESYLATE 10 MG PO TABS: ORAL_TABLET | ORAL | Status: DC

## 2013-08-10 ENCOUNTER — Encounter (HOSPITAL_COMMUNITY): Payer: Self-pay | Admitting: *Deleted

## 2013-08-10 ENCOUNTER — Ambulatory Visit (HOSPITAL_COMMUNITY)
Admission: RE | Admit: 2013-08-10 | Discharge: 2013-08-10 | Disposition: A | Discharge status: Home / Self Care | Payer: Medicare Other | Source: Ambulatory Visit | Attending: Internal Medicine | Admitting: Internal Medicine

## 2013-08-10 ENCOUNTER — Encounter (HOSPITAL_COMMUNITY)
Admission: RE | Disposition: A | Discharge status: Home / Self Care | Payer: Self-pay | Source: Ambulatory Visit | Attending: Internal Medicine

## 2013-08-10 DIAGNOSIS — Z79899 Other long term (current) drug therapy: Secondary | ICD-10-CM | POA: Insufficient documentation

## 2013-08-10 DIAGNOSIS — E669 Obesity, unspecified: Secondary | ICD-10-CM | POA: Diagnosis not present

## 2013-08-10 DIAGNOSIS — F3289 Other specified depressive episodes: Secondary | ICD-10-CM | POA: Diagnosis not present

## 2013-08-10 DIAGNOSIS — Z794 Long term (current) use of insulin: Secondary | ICD-10-CM | POA: Insufficient documentation

## 2013-08-10 DIAGNOSIS — R634 Abnormal weight loss: Secondary | ICD-10-CM | POA: Diagnosis not present

## 2013-08-10 DIAGNOSIS — E1142 Type 2 diabetes mellitus with diabetic polyneuropathy: Secondary | ICD-10-CM | POA: Insufficient documentation

## 2013-08-10 DIAGNOSIS — Z7982 Long term (current) use of aspirin: Secondary | ICD-10-CM | POA: Diagnosis not present

## 2013-08-10 DIAGNOSIS — E039 Hypothyroidism, unspecified: Secondary | ICD-10-CM | POA: Diagnosis not present

## 2013-08-10 DIAGNOSIS — Z1211 Encounter for screening for malignant neoplasm of colon: Secondary | ICD-10-CM | POA: Diagnosis not present

## 2013-08-10 DIAGNOSIS — I739 Peripheral vascular disease, unspecified: Secondary | ICD-10-CM | POA: Insufficient documentation

## 2013-08-10 DIAGNOSIS — E1149 Type 2 diabetes mellitus with other diabetic neurological complication: Secondary | ICD-10-CM | POA: Insufficient documentation

## 2013-08-10 DIAGNOSIS — N184 Chronic kidney disease, stage 4 (severe): Secondary | ICD-10-CM | POA: Diagnosis not present

## 2013-08-10 DIAGNOSIS — F329 Major depressive disorder, single episode, unspecified: Secondary | ICD-10-CM | POA: Insufficient documentation

## 2013-08-10 DIAGNOSIS — Z880 Allergy status to penicillin: Secondary | ICD-10-CM | POA: Insufficient documentation

## 2013-08-10 DIAGNOSIS — I129 Hypertensive chronic kidney disease with stage 1 through stage 4 chronic kidney disease, or unspecified chronic kidney disease: Secondary | ICD-10-CM | POA: Diagnosis not present

## 2013-08-10 DIAGNOSIS — Z8673 Personal history of transient ischemic attack (TIA), and cerebral infarction without residual deficits: Secondary | ICD-10-CM | POA: Diagnosis not present

## 2013-08-10 DIAGNOSIS — K5730 Diverticulosis of large intestine without perforation or abscess without bleeding: Secondary | ICD-10-CM | POA: Diagnosis not present

## 2013-08-10 DIAGNOSIS — M1710 Unilateral primary osteoarthritis, unspecified knee: Secondary | ICD-10-CM | POA: Diagnosis not present

## 2013-08-10 DIAGNOSIS — D649 Anemia, unspecified: Secondary | ICD-10-CM | POA: Diagnosis not present

## 2013-08-10 DIAGNOSIS — Z7902 Long term (current) use of antithrombotics/antiplatelets: Secondary | ICD-10-CM | POA: Diagnosis not present

## 2013-08-10 DIAGNOSIS — M479 Spondylosis, unspecified: Secondary | ICD-10-CM | POA: Diagnosis not present

## 2013-08-10 DIAGNOSIS — D126 Benign neoplasm of colon, unspecified: Secondary | ICD-10-CM | POA: Diagnosis not present

## 2013-08-10 DIAGNOSIS — Z886 Allergy status to analgesic agent status: Secondary | ICD-10-CM | POA: Insufficient documentation

## 2013-08-10 DIAGNOSIS — E785 Hyperlipidemia, unspecified: Secondary | ICD-10-CM | POA: Diagnosis not present

## 2013-08-10 HISTORY — PX: COLONOSCOPY: SHX5424

## 2013-08-10 LAB — GLUCOSE, CAPILLARY: Glucose-Capillary: 128 mg/dL — ABNORMAL HIGH (ref 70–99)

## 2013-08-10 SURGERY — COLONOSCOPY
Anesthesia: Moderate Sedation

## 2013-08-10 MED ORDER — MEPERIDINE HCL 50 MG/ML IJ SOLN
INTRAMUSCULAR | Status: AC
  Filled 2013-08-10: qty 1

## 2013-08-10 MED ORDER — MIDAZOLAM HCL 5 MG/5ML IJ SOLN
INTRAMUSCULAR | Status: AC
  Filled 2013-08-10: qty 10

## 2013-08-10 MED ORDER — SODIUM CHLORIDE 0.9 % IV SOLN
INTRAVENOUS | Status: DC
  Administered 2013-08-10: 1000 mL via INTRAVENOUS

## 2013-08-10 MED ORDER — MIDAZOLAM HCL 5 MG/5ML IJ SOLN
INTRAMUSCULAR | Status: DC | PRN
  Administered 2013-08-10: 2 mg via INTRAVENOUS

## 2013-08-10 MED ORDER — STERILE WATER FOR IRRIGATION IR SOLN
Status: DC | PRN
  Administered 2013-08-10: 14:00:00

## 2013-08-10 MED ORDER — MEPERIDINE HCL 50 MG/ML IJ SOLN
INTRAMUSCULAR | Status: DC | PRN
  Administered 2013-08-10: 25 mg via INTRAVENOUS

## 2013-08-10 NOTE — H&P (Signed)
GOREE BOVARD is an 79 y.o. male.   Chief Complaint: Patient is here for colonoscopy. HPI: Patient is an 79 year old African male who is a for colonoscopy. Patient has anemia he denies melena or rectal bleeding. He has noted intermittent pain in right flank. He also denies hematuria. He has good appetite. He has lost 20 pounds over the last year or so. Family history is negative for colorectal carcinoma.  Past Medical History  Diagnosis Date  . Diabetes mellitus   . Hypothyroidism   . Hypertension   . Peripheral vascular disease, unspecified   . Unspecified hypothyroidism   . Depressive disorder, not elsewhere classified   . Obesity   . Other and unspecified hyperlipidemia   . Unspecified essential hypertension   . Pain in joint, lower leg   . Osteoarthrosis, unspecified whether generalized or localized, lower leg   . Derangement of meniscus, not elsewhere classified   . Lumbago   . Spondylosis of unspecified site without mention of myelopathy   . Spinal stenosis, unspecified region other than cervical   . Backache, unspecified   . Family history of diabetes mellitus   . Seizures   . Complete lesion of cervical spinal cord 03/14/3012    Stable since 2006  . Lacunar stroke, acute 03/14/2012  . Bradycardia 03/15/2012  . Gait disorder   . Knee contracture   . Diabetic neuropathy   . CVA (cerebrovascular accident) 09/07/12  . CKD (chronic kidney disease) stage 3, GFR 30-59 ml/min   . CKD (chronic kidney disease) stage 4, GFR 15-29 ml/min     Past Surgical History  Procedure Laterality Date  . Kidney stones left x2  1975  . Kidney surgery      Ruptured left kidney 30 yrs ago  from a kidney stone    Family History  Problem Relation Age of Onset  . Diabetes Mother   . Prostate cancer Father   . Diabetes Brother   . Diabetes Brother   . Hypertension Brother   . Hypertension Brother   . Hypertension Brother    Social History:  reports that he has never smoked. He does not have  any smokeless tobacco history on file. He reports that he does not drink alcohol or use illicit drugs.  Allergies:  Allergies  Allergen Reactions  . Bayer Advanced Aspirin [Aspirin] Nausea And Vomiting  . Penicillins Nausea And Vomiting    Medications Prior to Admission  Medication Sig Dispense Refill  . ALPHAGAN P 0.1 % SOLN Place 1 drop into both eyes every 8 (eight) hours.       Marland Kitchen amLODipine (NORVASC) 10 MG tablet TAKE ONE TABLET BY MOUTH EVERY DAY  30 tablet  3  . aspirin EC 81 MG tablet Take 1 tablet (81 mg total) by mouth daily.  150 tablet  2  . clopidogrel (PLAVIX) 75 MG tablet TAKE ONE TABLET BY MOUTH IN THE MORNING WITH BREAKFAST  30 tablet  3  . fluticasone (CUTIVATE) 0.05 % cream Apply 1 application topically as needed.      . hydrALAZINE (APRESOLINE) 50 MG tablet One half tablet three times daily  90 tablet  4  . insulin lispro protamine-insulin lispro (HUMALOG 75/25) (75-25) 100 UNIT/ML SUSP Inject 15 Units into the skin 2 (two) times daily with a meal.      . levETIRAcetam (KEPPRA) 500 MG tablet Take 250 mg by mouth at bedtime. Take ONE_HALF TAB BY MOUTH AT BEDTIME      . levothyroxine (SYNTHROID, LEVOTHROID)  150 MCG tablet TAKE ONE TABLET BY MOUTH EVERY DAY  30 tablet  2  . Levothyroxine Sodium 150 MCG CAPS Take 150 mcg by mouth every morning. FOR THYROID      . lovastatin (MEVACOR) 40 MG tablet TAKE ONE TABLET BY MOUTH AT BEDTIME  30 tablet  2  . Multiple Vitamins-Minerals (ICAPS MV PO) Take by mouth.      . peg 3350 powder (MOVIPREP) 100 G SOLR Take 1 kit (200 g total) by mouth once.  1 kit  0  . chlorthalidone (HYGROTON) 25 MG tablet Take 12.5 mg by mouth daily.        Results for orders placed during the hospital encounter of 08/10/13 (from the past 48 hour(s))  GLUCOSE, CAPILLARY     Status: Abnormal   Collection Time    08/10/13  2:00 PM      Result Value Range   Glucose-Capillary 128 (*) 70 - 99 mg/dL   No results found.  ROS  Blood pressure 163/82, pulse  66, temperature 97.9 F (36.6 C), temperature source Oral, resp. rate 20, height 5\' 7"  (1.702 m), weight 193 lb (87.544 kg), SpO2 97.00%. Physical Exam  Constitutional: He appears well-developed and well-nourished.  HENT:  Mouth/Throat: Oropharynx is clear and moist.  Eyes: Conjunctivae are normal. No scleral icterus.  Neck: No thyromegaly present.  Cardiovascular: Normal rate, regular rhythm and normal heart sounds.   No murmur heard. Respiratory: Effort normal and breath sounds normal.  GI: Soft. He exhibits no mass. Tenderness: mild tenderness at left flank.  Musculoskeletal: Edema: 2+ LE edema.  Lymphadenopathy:    He has no cervical adenopathy.  Neurological: He is alert.  Skin: Skin is warm and dry.     Assessment/Plan Average risk screening colonoscopy. Anemia possibly of chronic disease.  REHMAN,NAJEEB U 08/10/2013, 2:24 PM

## 2013-08-10 NOTE — Op Note (Signed)
COLONOSCOPY PROCEDURE REPORT  PATIENT:  Chase [name]  MR#:  [id number] Birthdate:  [birth year], 79 y.o., male Endoscopist:  Dr. Rogene Houston, MD Referred By:  Dr. Tula Nakayama, MD Procedure Date: 08/10/2013  Procedure:   Colonoscopy  Indications:  Patient is an 79 year old African American male was undergoing colonoscopy primarily for screening purposes. He has anemia possibly of chronic disease. There is no history of melena or rectal bleeding.  Informed Consent:  The procedure and risks were reviewed with the patient and informed consent was obtained.  Medications:  Demerol 25 mg IV Versed 2 mg IV  Description of procedure:  After a digital rectal exam was performed, that colonoscope was advanced from the anus through the rectum and colon to the area of the cecum, ileocecal valve and appendiceal orifice. The cecum was deeply intubated. These structures were well-seen and photographed for the record. From the level of the cecum and ileocecal valve, the scope was slowly and cautiously withdrawn. The mucosal surfaces were carefully surveyed utilizing scope tip to flexion to facilitate fold flattening as needed. The scope was pulled down into the rectum where a thorough exam including retroflexion was performed.  Findings:   Prep excellent. 5 mm polyp cold snared from hepatic flexure. A few scattered diverticula noted at sigmoid and descending colon. Normal rectal mucosa and anal rectal junction.    Therapeutic/Diagnostic Maneuvers Performed:  See above  Complications:  None  Cecal Withdrawal Time:  12 minutes  Impression:  Examination performed to cecum. 5 mm polyp cold snared from the hepatic flexure. Few scattered diverticula at sigmoid and descending colon.  Recommendations:  Standard instructions given. I will contact patient with biopsy results and further recommendations.  Lesle Faron U  08/10/2013 3:05 PM  CC: Dr. Tula Nakayama, MD & Dr. Rayne Du ref. provider  found

## 2013-08-11 ENCOUNTER — Encounter (HOSPITAL_COMMUNITY): Payer: Self-pay | Admitting: Internal Medicine

## 2013-08-17 DIAGNOSIS — M25519 Pain in unspecified shoulder: Secondary | ICD-10-CM | POA: Diagnosis not present

## 2013-08-22 ENCOUNTER — Ambulatory Visit (INDEPENDENT_AMBULATORY_CARE_PROVIDER_SITE_OTHER): Payer: Medicare Other | Admitting: Family Medicine

## 2013-08-22 ENCOUNTER — Encounter (INDEPENDENT_AMBULATORY_CARE_PROVIDER_SITE_OTHER): Payer: Self-pay | Admitting: *Deleted

## 2013-08-22 ENCOUNTER — Encounter: Payer: Self-pay | Admitting: Family Medicine

## 2013-08-22 VITALS — BP 186/80 | HR 84 | Resp 18 | Ht 67.0 in | Wt 193.0 lb

## 2013-08-22 DIAGNOSIS — E1165 Type 2 diabetes mellitus with hyperglycemia: Secondary | ICD-10-CM | POA: Diagnosis not present

## 2013-08-22 DIAGNOSIS — IMO0002 Reserved for concepts with insufficient information to code with codable children: Secondary | ICD-10-CM

## 2013-08-22 DIAGNOSIS — I10 Essential (primary) hypertension: Secondary | ICD-10-CM

## 2013-08-22 DIAGNOSIS — E785 Hyperlipidemia, unspecified: Secondary | ICD-10-CM

## 2013-08-22 DIAGNOSIS — E1129 Type 2 diabetes mellitus with other diabetic kidney complication: Secondary | ICD-10-CM | POA: Diagnosis not present

## 2013-08-22 DIAGNOSIS — Z024 Encounter for examination for driving license: Secondary | ICD-10-CM

## 2013-08-22 DIAGNOSIS — Z23 Encounter for immunization: Secondary | ICD-10-CM | POA: Diagnosis not present

## 2013-08-22 DIAGNOSIS — E1149 Type 2 diabetes mellitus with other diabetic neurological complication: Secondary | ICD-10-CM

## 2013-08-22 DIAGNOSIS — G40209 Localization-related (focal) (partial) symptomatic epilepsy and epileptic syndromes with complex partial seizures, not intractable, without status epilepticus: Secondary | ICD-10-CM | POA: Diagnosis not present

## 2013-08-22 DIAGNOSIS — N058 Unspecified nephritic syndrome with other morphologic changes: Secondary | ICD-10-CM

## 2013-08-22 DIAGNOSIS — E1143 Type 2 diabetes mellitus with diabetic autonomic (poly)neuropathy: Secondary | ICD-10-CM

## 2013-08-22 DIAGNOSIS — G909 Disorder of the autonomic nervous system, unspecified: Secondary | ICD-10-CM

## 2013-08-22 DIAGNOSIS — Z0289 Encounter for other administrative examinations: Secondary | ICD-10-CM

## 2013-08-22 DIAGNOSIS — Z91199 Patient's noncompliance with other medical treatment and regimen due to unspecified reason: Secondary | ICD-10-CM

## 2013-08-22 DIAGNOSIS — Z9119 Patient's noncompliance with other medical treatment and regimen: Secondary | ICD-10-CM

## 2013-08-22 MED ORDER — CHLORTHALIDONE 25 MG PO TABS: 12.5000 mg | ORAL_TABLET | Freq: Every day | ORAL | Status: DC

## 2013-08-22 NOTE — Progress Notes (Signed)
  Subjective:    Patient ID: Chase [name], male    DOB: June 25, 1931, 79 y.o.   MRN: [id number]  HPI Pt in with express desire to renew his driver's license. He has  A seizure disorder and has been seizure free for over 1 year and remains under the care of neurology. His diabetes is better controlled under the care of endocrinologist ans he has had no hypoglycemic episodes in the past 2 years that have necessitated hospitalization. He denies hypoglycemia and i emind him of and review the symptoms. Blood pressure is markedly elevated at the visit, he does not have his medication and is likely not taking them all as prescribed, based on the BP today, the importance of same is stressed.Denies any recent falls     Review of Systems See HPI Denies recent fever or chills. Denies sinus pressure, nasal congestion, ear pain or sore throat. Denies chest congestion, productive cough or wheezing. Denies chest pains, palpitations and leg swelling Denies abdominal pain, nausea, vomiting,diarrhea or constipation.   Denies dysuria, frequency, hesitancy or incontinence. Chronic limitation in mobility is unchanged as his numbness and weakness Denies headaches, seizures, numbness, or tingling. Denies depression, anxiety or insomnia. Denies skin break down or rash.        Objective:   Physical Exam Patient alert and oriented and in no cardiopulmonary distress.  HEENT: No facial asymmetry, EOMI, no sinus tenderness,  oropharynx pink and moist.  Neck supple no adenopathy.  Chest: Clear to auscultation bilaterally.  CVS: S1, S2 no murmurs, no S3.  ABD: Soft non tender. Bowel sounds normal.  Ext: No edema  MS: Adequate ROM spine, shoulders, hips and knees.  Skin: Intact, no ulcerations or rash noted.  Psych: Good eye contact, normal affect. Memory intact not anxious or depressed appearing.  CNS: CN 2-12 intact, power adequate throughout, though he has right upper extremity  weakness. Peripheral numbness present in all extremities.Grip is adequate in both hands, greater than 3        Assessment & Plan:

## 2013-08-22 NOTE — Patient Instructions (Addendum)
F/u in 2 week  Flu vaccine today.  BLOOD PRESSURE is too high today, you need to take all medication as prescribed EVERY day at the same time, return next week for re evaluation of blood pressure, has to improve before you can have DMV formed signed off

## 2013-08-27 DIAGNOSIS — Z024 Encounter for examination for driving license: Secondary | ICD-10-CM | POA: Insufficient documentation

## 2013-08-27 NOTE — Assessment & Plan Note (Signed)
Uncontrolled, pt does not have medication at the visit, will not qualify for driver's license with this BP, he is to return in 1 weeks for re eval and bring all medication to that visit. I do not believe he is taking all medication as prescribed

## 2013-08-27 NOTE — Assessment & Plan Note (Signed)
Pt requires blood pressure re evaluation Also requires assesment by oT /PT for road safety, referral will be entered

## 2013-08-27 NOTE — Assessment & Plan Note (Signed)
nio seizures in over 1 year and followed by neurology

## 2013-08-27 NOTE — Assessment & Plan Note (Signed)
Needs assesmen by PT/OT to assess motor vehicle safety

## 2013-08-27 NOTE — Assessment & Plan Note (Signed)
Improved , but limited in psychosocial support and unable to manage his complex health issues without help

## 2013-08-27 NOTE — Assessment & Plan Note (Signed)
Improved control undr care of endocrinologist, however , when discussed with the pt , states her never experiences symptoms of hypoglycemia Driving radius needs to be extremely restricted

## 2013-08-27 NOTE — Assessment & Plan Note (Signed)
Controlled, no change in medication Hyperlipidemia:Low fat diet discussed and encouraged.  \ 

## 2013-08-31 ENCOUNTER — Telehealth: Payer: Self-pay | Admitting: Family Medicine

## 2013-08-31 DIAGNOSIS — I10 Essential (primary) hypertension: Secondary | ICD-10-CM | POA: Diagnosis not present

## 2013-08-31 DIAGNOSIS — M25569 Pain in unspecified knee: Secondary | ICD-10-CM | POA: Diagnosis not present

## 2013-08-31 NOTE — Telephone Encounter (Signed)
Patient needs to call back to confirm if he wants this

## 2013-09-01 DIAGNOSIS — E1149 Type 2 diabetes mellitus with other diabetic neurological complication: Secondary | ICD-10-CM | POA: Diagnosis not present

## 2013-09-01 DIAGNOSIS — B351 Tinea unguium: Secondary | ICD-10-CM | POA: Diagnosis not present

## 2013-09-01 DIAGNOSIS — L97409 Non-pressure chronic ulcer of unspecified heel and midfoot with unspecified severity: Secondary | ICD-10-CM | POA: Diagnosis not present

## 2013-09-01 DIAGNOSIS — M79609 Pain in unspecified limb: Secondary | ICD-10-CM | POA: Diagnosis not present

## 2013-09-04 ENCOUNTER — Ambulatory Visit (INDEPENDENT_AMBULATORY_CARE_PROVIDER_SITE_OTHER): Payer: Medicare Other | Admitting: Family Medicine

## 2013-09-04 ENCOUNTER — Encounter: Payer: Self-pay | Admitting: Family Medicine

## 2013-09-04 VITALS — BP 174/82 | HR 72 | Resp 18 | Ht 67.0 in | Wt 192.1 lb

## 2013-09-04 DIAGNOSIS — E1129 Type 2 diabetes mellitus with other diabetic kidney complication: Secondary | ICD-10-CM | POA: Diagnosis not present

## 2013-09-04 DIAGNOSIS — G909 Disorder of the autonomic nervous system, unspecified: Secondary | ICD-10-CM

## 2013-09-04 DIAGNOSIS — I739 Peripheral vascular disease, unspecified: Secondary | ICD-10-CM

## 2013-09-04 DIAGNOSIS — E785 Hyperlipidemia, unspecified: Secondary | ICD-10-CM

## 2013-09-04 DIAGNOSIS — E1149 Type 2 diabetes mellitus with other diabetic neurological complication: Secondary | ICD-10-CM

## 2013-09-04 DIAGNOSIS — IMO0002 Reserved for concepts with insufficient information to code with codable children: Secondary | ICD-10-CM

## 2013-09-04 DIAGNOSIS — E1143 Type 2 diabetes mellitus with diabetic autonomic (poly)neuropathy: Secondary | ICD-10-CM

## 2013-09-04 DIAGNOSIS — E1165 Type 2 diabetes mellitus with hyperglycemia: Secondary | ICD-10-CM | POA: Diagnosis not present

## 2013-09-04 DIAGNOSIS — G40209 Localization-related (focal) (partial) symptomatic epilepsy and epileptic syndromes with complex partial seizures, not intractable, without status epilepticus: Secondary | ICD-10-CM

## 2013-09-04 DIAGNOSIS — I10 Essential (primary) hypertension: Secondary | ICD-10-CM

## 2013-09-04 DIAGNOSIS — N058 Unspecified nephritic syndrome with other morphologic changes: Secondary | ICD-10-CM

## 2013-09-04 DIAGNOSIS — N184 Chronic kidney disease, stage 4 (severe): Secondary | ICD-10-CM

## 2013-09-04 DIAGNOSIS — E039 Hypothyroidism, unspecified: Secondary | ICD-10-CM

## 2013-09-04 MED ORDER — HYDRALAZINE HCL 25 MG PO TABS: ORAL_TABLET | ORAL | Status: DC

## 2013-09-04 MED ORDER — CHLORTHALIDONE 25 MG PO TABS: ORAL_TABLET | ORAL | Status: DC

## 2013-09-04 NOTE — Progress Notes (Signed)
  Subjective:    Patient ID: Chase [name], male    DOB: 06-Oct-1931, 79 y.o.   MRN: [id number]  HPI Pt in for assessment of his diabetes and general health to determine suitability to drive. He had left a form to be completed, however , based on his chronic  Health conditions , in house review is needed. Pt denies any seizures in past 6 months at least. Still wants to drive though gait instability with weakness and poor sensation in hands and feet are longstanding and progressive problems He is "unable" to take the hydralazine 3 times daily, as prescribed , and blood pressure is high   Review of Systems See HPI Denies recent fever or chills. Denies sinus pressure, nasal congestion, ear pain or sore throat. Denies chest congestion, productive cough or wheezing. Denies chest pains, palpitations and leg swelling Denies abdominal pain, nausea, vomiting,diarrhea or constipation.   Denies dysuria, frequency, hesitancy or incontinence. Chronic s joint pain, swelling and limitation in mobility. Denies headaches, seizures, numbness, or tingling. Denies depression, anxiety or insomnia. Denies skin break down or rash.        Objective:   Physical Exam  Patient alert and oriented and in no cardiopulmonary distress.  HEENT: No facial asymmetry, EOMI, no sinus tenderness,  oropharynx pink and moist.  Neck decreased though adequate  no adenopathy.  Chest: Clear to auscultation bilaterally.  CVS: S1, S2 no murmurs, no S3.  ABD: Soft non tender. Bowel sounds normal.  Ext: No edema  MS: decreased  ROM spine, shoulders, hips and knees.  Skin: Intact, no ulcerations or rash noted.  Psych: Good eye contact, normal affect. Memory impaired  Mildly  anxious not  depressed appearing.  CNS: CN 2-12 intact,decreased power in hands, grade 3, decreased  Sensation in glove and stocking distribution       Assessment & Plan:

## 2013-09-04 NOTE — Patient Instructions (Addendum)
F/U in  5 weeks, cALL IF YOU NEED ME BEFORE  mICROALB FROM OFFICE TODAY  Blood pressure medications will be as follows  Amlodipine 10mg  one daily (no change)  Hydrallazine 50 mg one daily at bedtime (Changed from half three times daily)  Chlorthalidone 25mg  ONE whole daily  ( Increase from half daily)  You are being referred to PT /OT for evalaution for your ability to drive safely  Chem 7, non fast in 4 weeks  please

## 2013-09-06 LAB — MICROALBUMIN / CREATININE URINE RATIO
Creatinine, Urine: 78.3 mg/dL
Microalb Creat Ratio: 1201.3 mg/g — ABNORMAL HIGH (ref 0.0–30.0)
Microalb, Ur: 94.06 mg/dL — ABNORMAL HIGH (ref 0.00–1.89)

## 2013-09-11 ENCOUNTER — Ambulatory Visit (HOSPITAL_COMMUNITY)
Admission: RE | Admit: 2013-09-11 | Discharge: 2013-09-11 | Disposition: A | Discharge status: Home / Self Care | Payer: Medicare Other | Source: Ambulatory Visit | Attending: Family Medicine | Admitting: Family Medicine

## 2013-09-11 DIAGNOSIS — Z9181 History of falling: Secondary | ICD-10-CM | POA: Insufficient documentation

## 2013-09-11 DIAGNOSIS — I10 Essential (primary) hypertension: Secondary | ICD-10-CM | POA: Diagnosis not present

## 2013-09-11 DIAGNOSIS — IMO0001 Reserved for inherently not codable concepts without codable children: Secondary | ICD-10-CM | POA: Diagnosis not present

## 2013-09-11 DIAGNOSIS — E1142 Type 2 diabetes mellitus with diabetic polyneuropathy: Secondary | ICD-10-CM | POA: Insufficient documentation

## 2013-09-11 DIAGNOSIS — R269 Unspecified abnormalities of gait and mobility: Secondary | ICD-10-CM | POA: Diagnosis not present

## 2013-09-11 DIAGNOSIS — R2689 Other abnormalities of gait and mobility: Secondary | ICD-10-CM | POA: Insufficient documentation

## 2013-09-11 DIAGNOSIS — E1149 Type 2 diabetes mellitus with other diabetic neurological complication: Secondary | ICD-10-CM | POA: Diagnosis not present

## 2013-09-11 DIAGNOSIS — M25569 Pain in unspecified knee: Secondary | ICD-10-CM | POA: Insufficient documentation

## 2013-09-11 DIAGNOSIS — R262 Difficulty in walking, not elsewhere classified: Secondary | ICD-10-CM | POA: Insufficient documentation

## 2013-09-11 NOTE — Evaluation (Signed)
Physical Therapy Evaluation  Patient Details  Name: Chase [name] MRN: [id number] Date of Birth: July 27, 1931  Today's Date: 09/11/2013 Time: 1445-1530 PT Time Calculation (min): 45 min              Visit#: 1 of 12  Re-eval: 10/11/13 Assessment Diagnosis: peripheral neuropathy Next MD Visit: 10/09/2013 Prior Therapy: none  Authorization: medicare    Authorization Time Period:    Authorization Visit#: 1 of 10   Past Medical History:  Past Medical History  Diagnosis Date  . Peripheral vascular disease, unspecified   . Depressive disorder, not elsewhere classified   . Obesity   . Other and unspecified hyperlipidemia   . Pain in joint, lower leg   . Osteoarthrosis, unspecified whether generalized or localized, lower leg   . Derangement of meniscus, not elsewhere classified   . Lumbago   . Spondylosis of unspecified site without mention of myelopathy   . Spinal stenosis, unspecified region other than cervical   . Backache, unspecified   . Family history of diabetes mellitus   . Seizures   . Complete lesion of cervical spinal cord 03/14/3012    Stable since 2006  . Lacunar stroke, acute 03/14/2012  . Bradycardia 03/15/2012  . Gait disorder   . Knee contracture   . Diabetic neuropathy   . CVA (cerebrovascular accident) 09/07/12  . CKD (chronic kidney disease) stage 3, GFR 30-59 ml/min   . CKD (chronic kidney disease) stage 4, GFR 15-29 ml/min   . Diabetes mellitus approx 1994  . Hypothyroidism approx 2000  . Unspecified hypothyroidism   . Hypertension approx 1994  . Unspecified essential hypertension    Past Surgical History:  Past Surgical History  Procedure Laterality Date  . Kidney stones left x2  1975  . Kidney surgery      Ruptured left kidney 30 yrs ago  from a kidney stone  . Colonoscopy N/A 08/10/2013    Procedure: COLONOSCOPY;  Surgeon: Rogene Houston, MD;  Location: [facility];  Service: Endoscopy;  Laterality: N/A;  240     Subjective Symptoms/Limitations Symptoms: Chase [name] states that he has problems with his hands, his arms, his knees and his legs.  He states that in the past six months he has fallen once or twice. He has canes and walkers at home.  He states he just feels weak.   How long can you sit comfortably?: no problem How long can you stand comfortably?: no time at all How long can you walk comfortably?: less than five minutes Patient Stated Goals: to be able to come from sit to stand easier, walk better,  Pain Assessment Currently in Pain?: Yes Pain Score: 7  Pain Location: Knee Pain Orientation: Left Pain Frequency: Intermittent Pain Relieving Factors: rest Effect of Pain on Daily Activities: weightbearing  Precautions/Restrictions     Balance Screening    Prior Functioning     Cognition/Observation    Sensation/Coordination/Flexibility/Functional Tests    Assessment RLE AROM (degrees) Right Knee Extension: -20 RLE Strength Right Hip Flexion: 5/5 Right Hip Extension: 2-/5 Right Hip ABduction: 4/5 Right Hip ADduction: 5/5 Right Knee Flexion: 5/5 Right Knee Extension:  (4-/5) Right Ankle Dorsiflexion: 5/5 LLE AROM (degrees) Left Knee Extension: -20 LLE Strength Left Hip Flexion: 5/5 Left Hip Extension: 2-/5 Left Hip ABduction: 4/5 Left Hip ADduction: 5/5 Left Knee Flexion: 5/5 Left Knee Extension:  (4-/5) Left Ankle Dorsiflexion: 5/5  Exercise/Treatments Mobility/Balance  Posture/Postural Control Posture/Postural Control: Postural limitations Postural Limitations: forward bent 25 degrees  when walking [employer] Sit to Stand: Needs minimal aid to stand or to stabilize Standing Unsupported: Able to stand safely 2 minutes Sitting with Back Unsupported but Feet Supported on Floor or Stool: Able to sit safely and securely 2 minutes Stand to Sit: Uses backs of legs against chair to control descent Transfers: Able to transfer safely, definite need of  hands Standing Unsupported with Eyes Closed: Able to stand 10 seconds safely Standing Ubsupported with Feet Together: Able to place feet together independently and stand for 1 minute with supervision From Standing, Reach Forward with Outstretched Arm: Can reach forward >5 cm safely (2") From Standing Position, Pick up Object from Floor: Unable to pick up and needs supervision From Standing Position, Turn to Look Behind Over each Shoulder: Turn sideways only but maintains balance Turn 360 Degrees: Able to turn 360 degrees safely but slowly Standing Unsupported, Alternately Place Feet on Step/Stool: Needs assistance to keep from falling or unable to try Standing Unsupported, One Foot in Front: Able to take small step independently and hold 30 seconds Standing on One Leg: Tries to lift leg/unable to hold 3 seconds but remains standing independently Total Score: 31     Seated Other Seated Exercises: LAQ x 10  Supine Other Supine Exercises: bridge x 10 : sidelying abductionx 10     Physical Therapy Assessment and Plan PT Assessment and Plan Clinical Impression Statement: Pt referred to therapy with diagnosis of peripheral neuropathy which is affecting his gait.  Pt exam shows decreased strength, postural dysfunction, decreased balance.  Pt will benefit from skilled PT to improve current functional and saftey level waling.  Pt will benefit from skilled therapeutic intervention in order to improve on the following deficits: Abnormal gait;Decreased activity tolerance;Decreased balance;Decreased strength;Difficulty walking;Improper spinal/pelvic alignment Rehab Potential: Good PT Frequency: Min 3X/week PT Duration: 4 weeks PT Treatment/Interventions: Gait training;Therapeutic activities;Therapeutic exercise;Balance training;Functional mobility training;Neuromuscular re-education;Patient/family education PT Plan: Pt to begin exercises to improve posture and balance to include but not limited to  heel/toe raise, minisquat, SLS, tandem and retro gait, side steping ; t-band for posture, nustep ...    Goals Home Exercise Program Pt/caregiver will Perform Home Exercise Program: For increased strengthening PT Goal: Perform Home Exercise Program - Progress: Goal set today PT Short Term Goals Time to Complete Short Term Goals: 2 weeks PT Short Term Goal 1: Berg improved by 5  PT Short Term Goal 2: Pt to be able to come sit to stand with two attempts I PT Short Term Goal 3: Pt to be able to walk for 10 minutes  PT Long Term Goals Time to Complete Long Term Goals: 4 weeks PT Long Term Goal 1: Pt Berg to be improved 10 points  PT Long Term Goal 2: Pt to have had no falls  Long Term Goal 3: I in advance HEP Long Term Goal 4: Pt to be walking for 20 minutes  PT Long Term Goal 5: Pt to have only 10degree of forward bend when standing  Problem List Patient Active Problem List   Diagnosis Date Noted  . Poor balance 09/11/2013  . Difficulty in walking(719.7) 09/11/2013  . Hx of falling 09/11/2013  . Encounter for driver's license history and physical 08/27/2013  . Routine general medical examination at a health care facility 02/06/2013  . CVA (cerebral vascular accident) 09/07/2012  . CKD (chronic kidney disease) stage 4, GFR 15-29 ml/min 09/07/2012  . Anemia 09/07/2012  . Cholelithiasis 08/10/2012  . Medically noncompliant 07/04/2012  .  Shoulder pain, right 06/28/2012  . HTN (hypertension) 03/15/2012  . Bradycardia 03/15/2012  . Peripheral autonomic neuropathy due to diabetes mellitus 03/14/2012  . Complex partial seizure disorder 03/07/2012  . Right hand weakness 02/04/2012  . Falls frequently 07/13/2011  . DM (diabetes mellitus) type II uncontrolled with renal manifestation 11/26/2010  . FATIGUE 01/01/2010  . UNSTEADY GAIT 11/20/2009  . NUMBNESS 11/20/2009  . HYPOTHYROIDISM 03/09/2008  . HYPERLIPIDEMIA 03/09/2008  . OBESITY 03/09/2008  . HYPERTENSION 03/09/2008  . PERIPHERAL  VASCULAR DISEASE 03/09/2008  . DEGENERATIVE JOINT DISEASE, KNEE 01/26/2008  . DERANGEMENT MENISCUS 01/26/2008  . KNEE PAIN 01/26/2008  . SPONDYLOSIS 01/26/2008  . SPINAL STENOSIS 01/26/2008    PT - End of Session Equipment Utilized During Treatment: Gait belt Activity Tolerance: Patient tolerated treatment well PT Plan of Care PT Home Exercise Plan: given  GP Functional Assessment Tool Used: Berg Functional Limitation: Changing and maintaining body position Changing and Maintaining Body Position Current Status [id number]): At least 40 percent but less than 60 percent impaired, limited or restricted Changing and Maintaining Body Position Goal Status [id number]): At least 20 percent but less than 40 percent impaired, limited or restricted  Amarian Botero,CINDY 09/11/2013, 5:22 PM  Physician Documentation Your signature is required to indicate approval of the treatment plan as stated above.  Please sign and either send electronically or make a copy of this report for your files and return this physician signed original.   Please mark one 1.__approve of plan  2. ___approve of plan with the following conditions.   ______________________________                                                          _____________________ Physician Signature                                                                                                             Date

## 2013-09-13 ENCOUNTER — Ambulatory Visit (HOSPITAL_COMMUNITY)
Admission: RE | Admit: 2013-09-13 | Discharge: 2013-09-13 | Disposition: A | Discharge status: Home / Self Care | Payer: Medicare Other | Source: Ambulatory Visit | Attending: Family Medicine | Admitting: Family Medicine

## 2013-09-13 DIAGNOSIS — R2689 Other abnormalities of gait and mobility: Secondary | ICD-10-CM

## 2013-09-13 DIAGNOSIS — R262 Difficulty in walking, not elsewhere classified: Secondary | ICD-10-CM

## 2013-09-13 DIAGNOSIS — Z9181 History of falling: Secondary | ICD-10-CM

## 2013-09-13 NOTE — Progress Notes (Signed)
Physical Therapy Treatment Patient Details  Name: Chase [name] MRN: [id number] Date of Birth: Mar 24, 1931  Today's Date: 09/13/2013 Time: 1110-1158 PT Time Calculation (min): 48 min Neuromuscular re-ed x 48 Visit#: 2 of 12  Re-eval: 10/11/13   Authorization Visit#: 2 of 10   Subjective: Symptoms/Limitations Symptoms: Pt states he has been doing his exercises a little bit Pain Assessment Currently in Pain?: No/denies     Exercise/Treatments  Balance Exercises Standing SLS: Eyes open;3 reps Marching: Solid surface;10 reps Heel Raises: Both;10 reps Sit to Stand: Standard surface Other Standing Exercises: gait with longer stride; standing abduction B x 10 5 sec hold Other Standing Exercises: mini squat x 10       Seated Other Seated Exercises: Nustep L2 x 5"       Physical Therapy Assessment and Plan PT Assessment and Plan Clinical Impression Statement: Pt with significant balance and strrength deficits needing manual assistance with exercises.  Pt ambulates with decreased stride and increased cadence worked on gt to increase stride length and decrease candance to improve safety. Rehab Potential: Good PT Treatment/Interventions: Gait training;Therapeutic activities;Therapeutic exercise;Balance training;Functional mobility training;Neuromuscular re-education;Patient/family education PT Plan: Pt  exercises to improve posture and balance to include but not limited to  static standing to improve balance and posture; tandem and retro gait, side steping ; t-band for posture,    Goals    Problem List Patient Active Problem List   Diagnosis Date Noted  . Poor balance 09/11/2013  . Difficulty in walking(719.7) 09/11/2013  . Hx of falling 09/11/2013  . Encounter for driver's license history and physical 08/27/2013  . Routine general medical examination at a health care facility 02/06/2013  . CVA (cerebral vascular accident) 09/07/2012  . CKD (chronic kidney disease) stage 4,  GFR 15-29 ml/min 09/07/2012  . Anemia 09/07/2012  . Cholelithiasis 08/10/2012  . Medically noncompliant 07/04/2012  . Shoulder pain, right 06/28/2012  . HTN (hypertension) 03/15/2012  . Bradycardia 03/15/2012  . Peripheral autonomic neuropathy due to diabetes mellitus 03/14/2012  . Complex partial seizure disorder 03/07/2012  . Right hand weakness 02/04/2012  . Falls frequently 07/13/2011  . DM (diabetes mellitus) type II uncontrolled with renal manifestation 11/26/2010  . FATIGUE 01/01/2010  . UNSTEADY GAIT 11/20/2009  . NUMBNESS 11/20/2009  . HYPOTHYROIDISM 03/09/2008  . HYPERLIPIDEMIA 03/09/2008  . OBESITY 03/09/2008  . HYPERTENSION 03/09/2008  . PERIPHERAL VASCULAR DISEASE 03/09/2008  . DEGENERATIVE JOINT DISEASE, KNEE 01/26/2008  . DERANGEMENT MENISCUS 01/26/2008  . KNEE PAIN 01/26/2008  . SPONDYLOSIS 01/26/2008  . SPINAL STENOSIS 01/26/2008    PT - End of Session Equipment Utilized During Treatment: Gait belt Activity Tolerance: Patient tolerated treatment well  GP    [place] Addis,CINDY 09/13/2013, 12:05 PM

## 2013-09-15 ENCOUNTER — Ambulatory Visit (HOSPITAL_COMMUNITY)
Admission: RE | Admit: 2013-09-15 | Discharge: 2013-09-15 | Disposition: A | Discharge status: Home / Self Care | Payer: Medicare Other | Source: Ambulatory Visit | Attending: Family Medicine | Admitting: Family Medicine

## 2013-09-15 DIAGNOSIS — R262 Difficulty in walking, not elsewhere classified: Secondary | ICD-10-CM

## 2013-09-15 DIAGNOSIS — Z9181 History of falling: Secondary | ICD-10-CM

## 2013-09-15 DIAGNOSIS — R2689 Other abnormalities of gait and mobility: Secondary | ICD-10-CM

## 2013-09-15 NOTE — Progress Notes (Signed)
Physical Therapy Treatment Patient Details  Name: Chase [name] MRN: [id number] Date of Birth: 02/25/1931  Today's Date: 09/15/2013 Time: [id number] PT Time Calculation (min): 55 min Charge:  Neuromuscular [id number]; there ex [phone] Visit#: 3 of 12  Re-eval: 10/11/13    Authorization: medicare  Authorization Visit#: 3 of 10   Subjective: Symptoms/Limitations Symptoms: Pt states that he is a little sore Pain Assessment Currently in Pain?: No/denies    Exercise/Treatments   Balance Exercises Standing SLS: Eyes open;3 reps Rebounder:  (lateral and forward step ups x 10) Marching: Solid surface;10 reps Heel Raises: Both;10 reps Sit to Stand: Standard surface Other Standing Exercises: hip abduction/ extensionx 10 @ Other Standing Exercises: mini squat x 10      Seated Other Seated Exercises: Nustep L2 x 5" Other Seated Exercises: sit to stand x 10 x 5 with wife.       Physical Therapy Assessment and Plan PT Assessment and Plan Clinical Impression Statement: Pt wife assist pt up by pulling on Rt hand.  Explained that this could cause shoulder injury and educated on the proper way to assist sit to stand.  Pt has improved gait today; added lateral step up to workout. Rehab Potential: Good PT Plan: Pt  exercises to improve posture and balance to include but not limited to  static standing to improve balance and posture; tandem and retro gait, side steping ; t-band for posture,    Goals  progressing  Problem List Patient Active Problem List   Diagnosis Date Noted  . Poor balance 09/11/2013  . Difficulty in walking(719.7) 09/11/2013  . Hx of falling 09/11/2013  . Encounter for driver's license history and physical 08/27/2013  . Routine general medical examination at a health care facility 02/06/2013  . CVA (cerebral vascular accident) 09/07/2012  . CKD (chronic kidney disease) stage 4, GFR 15-29 ml/min 09/07/2012  . Anemia 09/07/2012  . Cholelithiasis 08/10/2012  .  Medically noncompliant 07/04/2012  . Shoulder pain, right 06/28/2012  . HTN (hypertension) 03/15/2012  . Bradycardia 03/15/2012  . Peripheral autonomic neuropathy due to diabetes mellitus 03/14/2012  . Complex partial seizure disorder 03/07/2012  . Right hand weakness 02/04/2012  . Falls frequently 07/13/2011  . DM (diabetes mellitus) type II uncontrolled with renal manifestation 11/26/2010  . FATIGUE 01/01/2010  . UNSTEADY GAIT 11/20/2009  . NUMBNESS 11/20/2009  . HYPOTHYROIDISM 03/09/2008  . HYPERLIPIDEMIA 03/09/2008  . OBESITY 03/09/2008  . HYPERTENSION 03/09/2008  . PERIPHERAL VASCULAR DISEASE 03/09/2008  . DEGENERATIVE JOINT DISEASE, KNEE 01/26/2008  . DERANGEMENT MENISCUS 01/26/2008  . KNEE PAIN 01/26/2008  . SPONDYLOSIS 01/26/2008  . SPINAL STENOSIS 01/26/2008    PT - End of Session Equipment Utilized During Treatment: Gait belt Activity Tolerance: Patient tolerated treatment well  GP    RUSSELL,CINDY 09/15/2013, 10:01 AM

## 2013-09-18 ENCOUNTER — Telehealth: Payer: Self-pay | Admitting: Family Medicine

## 2013-09-18 ENCOUNTER — Ambulatory Visit (HOSPITAL_COMMUNITY)
Admission: RE | Admit: 2013-09-18 | Discharge: 2013-09-18 | Disposition: A | Discharge status: Home / Self Care | Payer: Medicare Other | Source: Ambulatory Visit | Attending: Family Medicine | Admitting: Family Medicine

## 2013-09-18 DIAGNOSIS — M25511 Pain in right shoulder: Secondary | ICD-10-CM

## 2013-09-18 DIAGNOSIS — R279 Unspecified lack of coordination: Secondary | ICD-10-CM | POA: Insufficient documentation

## 2013-09-18 DIAGNOSIS — R29898 Other symptoms and signs involving the musculoskeletal system: Secondary | ICD-10-CM

## 2013-09-18 NOTE — Evaluation (Signed)
Occupational Therapy Evaluation  Patient Details  Name: Chase [name] MRN: [id number] Date of Birth: [birth year]  Today's Date: 09/18/2013 Time: 38- (1600)  OT EVAL 41'  Visit#: 1 of 12  Re-eval: 10/16/13  Assessment Diagnosis: Right Arm Pain Next MD Visit: unknown Prior Therapy: currently receiving PT for balance deficits  Authorization: Medicare  Authorization Time Period: before 10th visit  Authorization Visit#: 1 of 10   Past Medical History:  Past Medical History  Diagnosis Date  . Peripheral vascular disease, unspecified   . Depressive disorder, not elsewhere classified   . Obesity   . Other and unspecified hyperlipidemia   . Pain in joint, lower leg   . Osteoarthrosis, unspecified whether generalized or localized, lower leg   . Derangement of meniscus, not elsewhere classified   . Lumbago   . Spondylosis of unspecified site without mention of myelopathy   . Spinal stenosis, unspecified region other than cervical   . Backache, unspecified   . Family history of diabetes mellitus   . Seizures   . Complete lesion of cervical spinal cord 03/14/3012    Stable since 2006  . Lacunar stroke, acute 03/14/2012  . Bradycardia 03/15/2012  . Gait disorder   . Knee contracture   . Diabetic neuropathy   . CVA (cerebrovascular accident) 09/07/12  . CKD (chronic kidney disease) stage 3, GFR 30-59 ml/min   . CKD (chronic kidney disease) stage 4, GFR 15-29 ml/min   . Diabetes mellitus approx 1994  . Hypothyroidism approx 2000  . Unspecified hypothyroidism   . Hypertension approx 1994  . Unspecified essential hypertension    Past Surgical History:  Past Surgical History  Procedure Laterality Date  . Kidney stones left x2  1975  . Kidney surgery      Ruptured left kidney 30 yrs ago  from a kidney stone  . Colonoscopy N/A 08/10/2013    Procedure: COLONOSCOPY;  Surgeon: Rogene Houston, MD;  Location: [facility];  Service: Endoscopy;  Laterality: N/A;  240    Subjective  S:   My arm has hurt for several years,  Pertinent History: Chase [name] states that he has had right carpal tunnel syndrome for many years.  He feels that in the last 6 months, his symptoms have worsened.  He was referred to occupational therapy for evaluation and treatment.  Special Tests: DASH scored 56.8 with ideal score being 0. Patient Stated Goals: I would like to be able to golf again. Pain Assessment Currently in Pain?: No/denies Effect of Pain on Daily Activities: patient states his arm feels numb, not painful  Precautions/Restrictions  Precautions Precautions: Fall Restrictions Weight Bearing Restrictions: No  Balance Screening Balance Screen Has the patient fallen in the past 6 months: No Has the patient had a decrease in activity level because of a fear of falling? : No Is the patient reluctant to leave their home because of a fear of falling? : No  Prior Functioning  Prior Function Driving: No Vocation: Retired Comments: lives with his wife, likes to golf and Copywriter, advertising ADL/Vision/Perception ADL ADL Comments: unable to use his right hand as dominant due to frequently dropping items, unable to reach above shoulder height. Dominant Hand: Right Vision - History Baseline Vision: No visual deficits  Cognition/Observation Cognition Overall Cognitive Status: Within Functional Limits for tasks assessed  Sensation/Coordination/Edema Sensation Light Touch: Impaired by gross assessment (dulled light touch sensation in entire RUE) Coordination 9 Hole Peg Test: right:  placed 5 independently in 2  minutes, then OTR/L held pegs upright and allowed patient to pick up, then removed independently in a total of 4'16"  left 48.84"  Additional Assessments RUE AROM (degrees) RUE Overall AROM Comments: assessed in seated Right Shoulder Flexion: 110 Degrees Right Shoulder ABduction: 94 Degrees Right Shoulder Internal Rotation: 94 Degrees Right Shoulder External  Rotation: 30 Degrees Right Elbow Flexion: 124 Right Elbow Extension:  (lacks 33 degrees from 0) Right Forearm Pronation: 70 Degrees Right Forearm Supination: 50 Degrees Right Wrist Extension: 48 Degrees Right Wrist Flexion: 40 Degrees RUE Strength RUE Overall Strength Comments: shoulder-forearm strength is 4/5, wrist strength is 3+/5 Grip (lbs): 20 (left 60) Lateral Pinch: 6 lbs (left 14) 3 Point Pinch: 4 lbs (left 8) Palpation Palpation: arthritic joints Right Hand Strength - Pinch (lbs) Lateral Pinch: 6 lbs (left 14) 3 Point Pinch: 4 lbs (left 8)      Occupational Therapy Assessment and Plan OT Assessment and Plan Clinical Impression Statement: A:  Patient presents with decreased AROM, strength, sensation, and coordination in his right arm and hand.  Patient's deficits causing decreased use of his RUE as dominant with B/IADLs and leisure activities.  Pt will benefit from skilled therapeutic intervention in order to improve on the following deficits: Decreased range of motion;Increased fascial restricitons;Impaired sensation;Increased muscle spasms;Decreased strength;Decreased coordination Rehab Potential: Good OT Frequency: Min 2X/week OT Duration: 6 weeks OT Treatment/Interventions: Self-care/ADL training;Manual therapy;Therapeutic exercise;Therapeutic activities;Patient/family education OT Plan: P:  Skilled OT intervention to improve AROM, strength, coordination, and sensation in his RUE in order to use RUE as dominant with B/IADLs and leisure activities.  Treatment Plan:  AROM of RUE, ball stretches, tputty, grip strengthening, tendon glides, nerve glides, [facility] training, sensory reintegration.     Goals Short Term Goals Time to Complete Short Term Goals: 3 weeks Short Term Goal 1: Patient will be educated on HEP. Short Term Goal 2: Patient will improve RUE AROM by 10 degrees for greater use of RUE as dominant with functional activities.  Short Term Goal 3: Patient will increase  right wrist strength to 4-/5 for greater ability to pick up bags of groceries. Short Term Goal 4: Patient will improve right grip strength by 10 pounds and pinch strength by 2 pounds for greater ability to open jars. Short Term Goal 5: Patient will complete nine hole peg test with his right hand without assistance from OTR/L Additional Short Term Goals?: Yes Long Term Goals Time to Complete Long Term Goals: 6 weeks Long Term Goal 1: Patient will use his right arm as dominant with daily activities.  Long Term Goal 2: Patient will improve RUE AROM to [facility] for greater use of RUE as dominant with functional activities.  Long Term Goal 3: Patient will increase right wrist strength to 4+/5 for greater ability to pick up bags of groceries. Long Term Goal 4: Patient will improve right grip strength by 15 pounds and pinch strength by 4 pounds for greater ability to open water bottles. Long Term Goal 5: Patient will complete nine hole peg test with his right hand in 3 minutes or less for greater ability to fasten buttons. Additional Long Term Goals?: Yes Long Term Goal 6: Patient will improve light touch sensation from dulled to intact.   Problem List Patient Active Problem List   Diagnosis Date Noted  . Lack of coordination 09/18/2013  . Poor balance 09/11/2013  . Difficulty in walking(719.7) 09/11/2013  . Hx of falling 09/11/2013  . Encounter for driver's license history and physical 08/27/2013  .  Routine general medical examination at a health care facility 02/06/2013  . CVA (cerebral vascular accident) 09/07/2012  . CKD (chronic kidney disease) stage 4, GFR 15-29 ml/min 09/07/2012  . Anemia 09/07/2012  . Cholelithiasis 08/10/2012  . Medically noncompliant 07/04/2012  . Shoulder pain, right 06/28/2012  . HTN (hypertension) 03/15/2012  . Bradycardia 03/15/2012  . Peripheral autonomic neuropathy due to diabetes mellitus 03/14/2012  . Complex partial seizure disorder 03/07/2012  . Right hand  weakness 02/04/2012  . Falls frequently 07/13/2011  . DM (diabetes mellitus) type II uncontrolled with renal manifestation 11/26/2010  . FATIGUE 01/01/2010  . UNSTEADY GAIT 11/20/2009  . NUMBNESS 11/20/2009  . HYPOTHYROIDISM 03/09/2008  . HYPERLIPIDEMIA 03/09/2008  . OBESITY 03/09/2008  . HYPERTENSION 03/09/2008  . PERIPHERAL VASCULAR DISEASE 03/09/2008  . DEGENERATIVE JOINT DISEASE, KNEE 01/26/2008  . DERANGEMENT MENISCUS 01/26/2008  . KNEE PAIN 01/26/2008  . SPONDYLOSIS 01/26/2008  . SPINAL STENOSIS 01/26/2008    End of Session Activity Tolerance: Patient tolerated treatment well General Behavior During Therapy: WFL for tasks assessed/performed OT Plan of Care OT Home Exercise Plan: Tendon glides and yellow tputty.  Consulted and Agree with Plan of Care: Patient  GO Functional Assessment Tool Used: DASH scored 56 with ideal score being 0. Functional Limitation: Carrying, moving and handling objects Carrying, Moving and Handling Objects Current Status [id number]): At least 40 percent but less than 60 percent impaired, limited or restricted Carrying, Moving and Handling Objects Goal Status [phone]): At least 20 percent but less than 40 percent impaired, limited or restricted  Vangie Bicker, OTR/L  09/18/2013, 4:39 PM  Physician Documentation Your signature is required to indicate approval of the treatment plan as stated above.  Please sign and either send electronically or make a copy of this report for your files and return this physician signed original.  Please mark one 1.__approve of plan  2. ___approve of plan with the following conditions.   ______________________________                                                          _____________________ Physician Signature                                                                                                             Date

## 2013-09-18 NOTE — Progress Notes (Signed)
Physical Therapy Treatment Patient Details  Name: Chase [name] MRN: [id number] Date of Birth: 07/17/1931  Today's Date: 09/18/2013 Time: [id number] PT Time Calculation (min): 39 min  Visit#: 4 of 12  Re-eval: 10/11/13 Authorization: medicare  Authorization Visit#: 4 of 10  Charges: therex 30', gait 10'after therex  Subjective: Symptoms/Limitations Symptoms: Pt states his hips are achy today; may be the rainy weather. Pain Assessment Currently in Pain?: No/denies   Exercise/Treatments Balance Exercises Standing Marching: Solid surface;10 reps Heel Raises: 10 reps Toe Raise: 10 reps Sit to Stand: Standard surface;Limitations Sit to Stand Limitations: 5X without UE's Other Standing Exercises: hip abduction/ extensionx 10  Other Standing Exercises: mini squat x 10   Seated Other Seated Exercises: Nustep L2 hills #3, 8 minutes Other Seated Exercises: lateral step ups 10X each LE with 4" step        Physical Therapy Assessment and Plan PT Assessment:  Added new standing exercises with VC's for posture.  Pt required little rest breaks and was able to complete exercises efficiently.  Pt requires multimodal cues with ambulation due to shuffling, decreased stride and forward bent posture. PT Plan:  Continue to progress toward goals.   Problem List Patient Active Problem List   Diagnosis Date Noted  . Poor balance 09/11/2013  . Difficulty in walking(719.7) 09/11/2013  . Hx of falling 09/11/2013  . Encounter for driver's license history and physical 08/27/2013  . Routine general medical examination at a health care facility 02/06/2013  . CVA (cerebral vascular accident) 09/07/2012  . CKD (chronic kidney disease) stage 4, GFR 15-29 ml/min 09/07/2012  . Anemia 09/07/2012  . Cholelithiasis 08/10/2012  . Medically noncompliant 07/04/2012  . Shoulder pain, right 06/28/2012  . HTN (hypertension) 03/15/2012  . Bradycardia 03/15/2012  . Peripheral autonomic neuropathy due to  diabetes mellitus 03/14/2012  . Complex partial seizure disorder 03/07/2012  . Right hand weakness 02/04/2012  . Falls frequently 07/13/2011  . DM (diabetes mellitus) type II uncontrolled with renal manifestation 11/26/2010  . FATIGUE 01/01/2010  . UNSTEADY GAIT 11/20/2009  . NUMBNESS 11/20/2009  . HYPOTHYROIDISM 03/09/2008  . HYPERLIPIDEMIA 03/09/2008  . OBESITY 03/09/2008  . HYPERTENSION 03/09/2008  . PERIPHERAL VASCULAR DISEASE 03/09/2008  . DEGENERATIVE JOINT DISEASE, KNEE 01/26/2008  . DERANGEMENT MENISCUS 01/26/2008  . KNEE PAIN 01/26/2008  . SPONDYLOSIS 01/26/2008  . SPINAL STENOSIS 01/26/2008    PT - End of Session Equipment Utilized During Treatment: Gait belt Activity Tolerance: Patient tolerated treatment well   Teena Irani, PTA/CLT 09/18/2013, 3:36 PM

## 2013-09-20 ENCOUNTER — Encounter (INDEPENDENT_AMBULATORY_CARE_PROVIDER_SITE_OTHER): Payer: Self-pay

## 2013-09-20 ENCOUNTER — Ambulatory Visit (HOSPITAL_COMMUNITY)
Admission: RE | Admit: 2013-09-20 | Discharge: 2013-09-20 | Disposition: A | Discharge status: Home / Self Care | Payer: Medicare Other | Source: Ambulatory Visit | Attending: Family Medicine | Admitting: Family Medicine

## 2013-09-20 ENCOUNTER — Encounter: Payer: Self-pay | Admitting: Family Medicine

## 2013-09-20 ENCOUNTER — Ambulatory Visit (INDEPENDENT_AMBULATORY_CARE_PROVIDER_SITE_OTHER): Payer: Medicare Other | Admitting: Family Medicine

## 2013-09-20 VITALS — BP 148/72 | HR 78 | Resp 16 | Ht 67.0 in | Wt 189.0 lb

## 2013-09-20 DIAGNOSIS — Z91199 Patient's noncompliance with other medical treatment and regimen due to unspecified reason: Secondary | ICD-10-CM

## 2013-09-20 DIAGNOSIS — E1129 Type 2 diabetes mellitus with other diabetic kidney complication: Secondary | ICD-10-CM | POA: Diagnosis not present

## 2013-09-20 DIAGNOSIS — E1165 Type 2 diabetes mellitus with hyperglycemia: Secondary | ICD-10-CM | POA: Diagnosis not present

## 2013-09-20 DIAGNOSIS — G909 Disorder of the autonomic nervous system, unspecified: Secondary | ICD-10-CM

## 2013-09-20 DIAGNOSIS — G40209 Localization-related (focal) (partial) symptomatic epilepsy and epileptic syndromes with complex partial seizures, not intractable, without status epilepticus: Secondary | ICD-10-CM

## 2013-09-20 DIAGNOSIS — E039 Hypothyroidism, unspecified: Secondary | ICD-10-CM

## 2013-09-20 DIAGNOSIS — I10 Essential (primary) hypertension: Secondary | ICD-10-CM | POA: Diagnosis not present

## 2013-09-20 DIAGNOSIS — Z9119 Patient's noncompliance with other medical treatment and regimen: Secondary | ICD-10-CM

## 2013-09-20 DIAGNOSIS — E1149 Type 2 diabetes mellitus with other diabetic neurological complication: Secondary | ICD-10-CM

## 2013-09-20 DIAGNOSIS — E785 Hyperlipidemia, unspecified: Secondary | ICD-10-CM

## 2013-09-20 DIAGNOSIS — IMO0002 Reserved for concepts with insufficient information to code with codable children: Secondary | ICD-10-CM

## 2013-09-20 DIAGNOSIS — E1143 Type 2 diabetes mellitus with diabetic autonomic (poly)neuropathy: Secondary | ICD-10-CM

## 2013-09-20 DIAGNOSIS — N058 Unspecified nephritic syndrome with other morphologic changes: Secondary | ICD-10-CM

## 2013-09-20 MED ORDER — CLOPIDOGREL BISULFATE 75 MG PO TABS: ORAL_TABLET | ORAL | Status: DC

## 2013-09-20 MED ORDER — LOVASTATIN 40 MG PO TABS: ORAL_TABLET | ORAL | Status: DC

## 2013-09-20 NOTE — Progress Notes (Signed)
Physical Therapy Treatment Patient Details  Name: Chase [name] MRN: [id number] Date of Birth: 1931/11/25  Today's Date: 09/20/2013 Time: [id number] (Pt 81' late for appt))-1350 PT Time Calculation (min): 35 min Charges: Therex x 30'  Visit#: 5 of 12  Re-eval: 10/11/13  Authorization: medicare  Authorization Visit#: 5 of 10   Subjective: Symptoms/Limitations Symptoms: Pt states that he has been trying to do his exercises at home. Pain Assessment Currently in Pain?: No/denies   Exercise/Treatments Balance Exercises Standing Marching: 10 reps;Solid surface;Hand held assist (HHA) 2 Heel Raises: 10 reps Toe Raise: 10 reps Sit to Stand Limitations: 5X without UE's from 21" surface Other Standing Exercises: hip abduction/ extensionx 10  Other Standing Exercises: mini squat x 10   Seated Other Seated Exercises: Nustep L3 hills #2, 10 minutes Other Seated Exercises: LAQ 10x5" Seated march x 10 bilateral   Physical Therapy Assessment and Plan PT Assessment and Plan Clinical Impression Statement: Pt appears to be progressing well. Pt responds well to multimodal cueing to improve form with sit to stand. Pt is able to stand from 21" surface without UE assistance. Pt completes strengthening exercises well after multimodal cueing for form. Treatment limited by time. PT Plan: Progress exercises to improve posture and balance to include but not limited to static standing to improve balance and posture; tandem and retro gait, side steping ; t-band for posture.     Problem List Patient Active Problem List   Diagnosis Date Noted  . Lack of coordination 09/18/2013  . Poor balance 09/11/2013  . Difficulty in walking(719.7) 09/11/2013  . Hx of falling 09/11/2013  . Encounter for driver's license history and physical 08/27/2013  . Routine general medical examination at a health care facility 02/06/2013  . CVA (cerebral vascular accident) 09/07/2012  . CKD (chronic kidney disease) stage 4, GFR  15-29 ml/min 09/07/2012  . Anemia 09/07/2012  . Cholelithiasis 08/10/2012  . Medically noncompliant 07/04/2012  . Shoulder pain, right 06/28/2012  . HTN (hypertension) 03/15/2012  . Bradycardia 03/15/2012  . Peripheral autonomic neuropathy due to diabetes mellitus 03/14/2012  . Complex partial seizure disorder 03/07/2012  . Right hand weakness 02/04/2012  . Falls frequently 07/13/2011  . DM (diabetes mellitus) type II uncontrolled with renal manifestation 11/26/2010  . FATIGUE 01/01/2010  . UNSTEADY GAIT 11/20/2009  . NUMBNESS 11/20/2009  . HYPOTHYROIDISM 03/09/2008  . HYPERLIPIDEMIA 03/09/2008  . OBESITY 03/09/2008  . HYPERTENSION 03/09/2008  . PERIPHERAL VASCULAR DISEASE 03/09/2008  . DEGENERATIVE JOINT DISEASE, KNEE 01/26/2008  . DERANGEMENT MENISCUS 01/26/2008  . KNEE PAIN 01/26/2008  . SPONDYLOSIS 01/26/2008  . SPINAL STENOSIS 01/26/2008    PT - End of Session Equipment Utilized During Treatment: Gait belt Activity Tolerance: Patient tolerated treatment well General Behavior During Therapy: [facility] for tasks assessed/performed  Rachelle Hora, PTA  09/20/2013, 4:55 PM

## 2013-09-20 NOTE — Telephone Encounter (Signed)
Patient in for ov

## 2013-09-20 NOTE — Progress Notes (Signed)
  Subjective:    Patient ID: Chase [name], male    DOB: 08/06/1931, 79 y.o.   MRN: [id number]  HPI  Pt in for re eval for Blood pressure for driver's license. No light headedness, no leg swelling ., no chest pain or palpitations. Feels well He does have significant mobility issues which he is going to physical therapy for. I encourage him to continue for his own health, but as far as his safe operation of a motor vehicle, I will recommend against this , unless the therapist I have requested from the [facility] approves this Though his blood sugar and seizures are fairly well controlled at this time, I do not believe he is a safe driver with his mobility issues and numbness in the extremities, I have conveyed this on multiple occasions, but for some reason, pt still seems intent on retaining driving priviliges  Review of Systems See HPI Denies recent fever or chills. Denies sinus pressure, nasal congestion, ear pain or sore throat. Denies chest congestion, productive cough or wheezing. Denies chest pains, palpitations and leg swelling Denies abdominal pain, nausea, vomiting,diarrhea or constipation.   Denies dysuria, frequency, hesitancy or incontinence. Chronic  limitation in mobility. Denies headaches, seizures,  chronic numbness, in extremities Denies depression, anxiety or insomnia. Denies skin break down or rash.        Objective:   Physical Exam  Patient alert and oriented and in no cardiopulmonary distress.  HEENT: No facial asymmetry, EOMI, no sinus tenderness,  oropharynx pink and moist.  Neck supple no adenopathy.  Chest: Clear to auscultation bilaterally.  CVS: S1, S2 no murmurs, no S3.  ABD: Soft non tender. Bowel sounds normal.  Ext: No edema  MS: decreased  ROM spine, shoulders, hips and knees.  Skin: Intact, no ulcerations or rash noted.  Psych: Good eye contact, normal affect. Memory intact not anxious or depressed appearing.  CNS: CN 2-12 intact,reduced power  and sensation.       Assessment & Plan:

## 2013-09-20 NOTE — Patient Instructions (Addendum)
Annual wellness Feb 28 or after, call if you need me before  Blood pressure much improved, forms for driver's license will be sent in this week  You will still need to be tested for mobility as far as safe driving is concerned,  I am not signing that it is safe for you to drive at this time  The form you handed to me at the office visit states clearly that after October 19 you have no driving priviliges, and you need to adhere to this   Chem 7 today  Continue current medication

## 2013-09-21 LAB — BASIC METABOLIC PANEL
BUN: 44 mg/dL — ABNORMAL HIGH (ref 6–23)
CO2: 24 mEq/L (ref 19–32)
Calcium: 9.4 mg/dL (ref 8.4–10.5)
Chloride: 107 mEq/L (ref 96–112)
Creat: 2.52 mg/dL — ABNORMAL HIGH (ref 0.50–1.35)
Glucose, Bld: 168 mg/dL — ABNORMAL HIGH (ref 70–99)
Potassium: 4.3 mEq/L (ref 3.5–5.3)
Sodium: 136 mEq/L (ref 135–145)

## 2013-09-22 ENCOUNTER — Ambulatory Visit (HOSPITAL_COMMUNITY): Payer: Medicare Other | Admitting: Physical Therapy

## 2013-09-22 DIAGNOSIS — I8310 Varicose veins of unspecified lower extremity with inflammation: Secondary | ICD-10-CM | POA: Diagnosis not present

## 2013-09-22 DIAGNOSIS — I872 Venous insufficiency (chronic) (peripheral): Secondary | ICD-10-CM | POA: Diagnosis not present

## 2013-09-24 ENCOUNTER — Encounter: Payer: Self-pay | Admitting: Family Medicine

## 2013-09-24 NOTE — Assessment & Plan Note (Signed)
Improved and adequate control DASH diet and commitment to daily physical activity for a minimum of 30 minutes discussed and encouraged, as a part of hypertension management. The importance of attaining a healthy weight is also discussed.

## 2013-09-24 NOTE — Assessment & Plan Note (Signed)
Controlled, no change in medication Treated by endo

## 2013-09-24 NOTE — Assessment & Plan Note (Signed)
Still needs zostavax

## 2013-09-24 NOTE — Assessment & Plan Note (Signed)
Poor sensation in digits, poor candidate as far as safe operation of a vehicle is concerned , will need OT eval sent message to [facility] and family aware

## 2013-09-24 NOTE — Assessment & Plan Note (Signed)
Currently stable , no seizures in past 6 month

## 2013-09-24 NOTE — Assessment & Plan Note (Signed)
Improved, treated by endo Patient advised to reduce carb and sweets, commit to regular physical activity, take meds as prescribed, test blood as directed, and attempt to lose weight, to improve blood sugar control.

## 2013-09-24 NOTE — Assessment & Plan Note (Signed)
Controlled, no change in medication Hyperlipidemia:Low fat diet discussed and encouraged.  \ 

## 2013-09-25 ENCOUNTER — Ambulatory Visit (HOSPITAL_COMMUNITY)
Admission: RE | Admit: 2013-09-25 | Discharge: 2013-09-25 | Disposition: A | Discharge status: Home / Self Care | Payer: Medicare Other | Source: Ambulatory Visit | Attending: Family Medicine | Admitting: Family Medicine

## 2013-09-25 NOTE — Progress Notes (Signed)
Physical Therapy Treatment Patient Details  Name: Chase [name] MRN: [id number] Date of Birth: Oct 15, 1931  Today's Date: 09/25/2013 Time: 1302-1348 PT Time Calculation (min): 46 min Charges:  Therex x 40' Visit#: 6 of 12  Re-eval: 10/11/13  Authorization: medicare  Authorization Visit#: 6 of 10   Subjective: Symptoms/Limitations Symptoms: Pt states that his back is bothering him today. Pain Assessment Currently in Pain?: Yes Pain Score: 7  Pain Location: Back Pain Orientation: Lower   Exercise/Treatments Balance Exercises Standing Retro Gait: 1 rep Sidestepping: 1 rep Marching: 10 reps;Solid surface;Hand held assist (HHA) 2 Heel Raises: 10 reps Toe Raise: 10 reps Sit to Stand Limitations: 5X without UE's from 20" surface Other Standing Exercises: mini squat x 10   Seated Other Seated Exercises: Nustep L3 hills #3, 10 minutes keeping SPM above 80 Other Seated Exercises: LAQ 10x5"5# wt Seated march x 10 bilateral   Physical Therapy Assessment and Plan PT Assessment and Plan Clinical Impression Statement: Pt displays improve activity tolerance this session requiring only one seated rest break. Pt requires multimodal cueing to improved posture throughout session. Began sidestepping and retro gait with multimodal cueing for technique. PT Plan: Progress exercises to improve posture and balance to include but not limited to static standing to improve balance and posture; tandem and retro gait, side steping ; t-band for posture.     Problem List Patient Active Problem List   Diagnosis Date Noted  . Lack of coordination 09/18/2013  . Poor balance 09/11/2013  . Difficulty in walking(719.7) 09/11/2013  . Hx of falling 09/11/2013  . Encounter for driver's license history and physical 08/27/2013  . Routine general medical examination at a health care facility 02/06/2013  . CVA (cerebral vascular accident) 09/07/2012  . CKD (chronic kidney disease) stage 4, GFR 15-29 ml/min  09/07/2012  . Anemia 09/07/2012  . Cholelithiasis 08/10/2012  . Medically noncompliant 07/04/2012  . Shoulder pain, right 06/28/2012  . HTN (hypertension) 03/15/2012  . Bradycardia 03/15/2012  . Peripheral autonomic neuropathy due to diabetes mellitus 03/14/2012  . Complex partial seizure disorder 03/07/2012  . Right hand weakness 02/04/2012  . Falls frequently 07/13/2011  . DM (diabetes mellitus) type II uncontrolled with renal manifestation 11/26/2010  . FATIGUE 01/01/2010  . UNSTEADY GAIT 11/20/2009  . NUMBNESS 11/20/2009  . HYPOTHYROIDISM 03/09/2008  . HYPERLIPIDEMIA 03/09/2008  . OBESITY 03/09/2008  . HYPERTENSION 03/09/2008  . PERIPHERAL VASCULAR DISEASE 03/09/2008  . DEGENERATIVE JOINT DISEASE, KNEE 01/26/2008  . DERANGEMENT MENISCUS 01/26/2008  . KNEE PAIN 01/26/2008  . SPONDYLOSIS 01/26/2008  . SPINAL STENOSIS 01/26/2008    PT - End of Session Equipment Utilized During Treatment: Gait belt Activity Tolerance: Patient tolerated treatment well General Behavior During Therapy: [facility] for tasks assessed/performed        Rachelle Hora, PTA 09/25/2013, 2:08 PM

## 2013-09-27 ENCOUNTER — Ambulatory Visit (HOSPITAL_COMMUNITY)
Admission: RE | Admit: 2013-09-27 | Discharge: 2013-09-27 | Disposition: A | Discharge status: Home / Self Care | Payer: Medicare Other | Source: Ambulatory Visit

## 2013-09-27 DIAGNOSIS — R262 Difficulty in walking, not elsewhere classified: Secondary | ICD-10-CM

## 2013-09-27 DIAGNOSIS — R2689 Other abnormalities of gait and mobility: Secondary | ICD-10-CM

## 2013-09-27 DIAGNOSIS — Z9181 History of falling: Secondary | ICD-10-CM

## 2013-09-27 NOTE — Progress Notes (Signed)
Physical Therapy Treatment Patient Details  Name: Chase [name] MRN: [id number] Date of Birth: 06/13/1931  Today's Date: 09/27/2013 Time: [id number] PT Time Calculation (min): 52 min Charge: Therex 38' 1346-1424 , NMR 14' [phone]  Visit#: 7 of 12  Re-eval: 10/11/13 Assessment Diagnosis: peripheral neuropathy Next MD Visit: 10/09/2013 Prior Therapy: none  Authorization: medicare  Authorization Time Period:    Authorization Visit#: 7 of 10   Subjective: Symptoms/Limitations Symptoms: Pt stated back pain today, pain scale 5-6/10 Pain Assessment Currently in Pain?: Yes Pain Score: 6  Pain Location: Back  Precautions/Restrictions  Precautions Precautions: Fall  Exercise/Treatments Balance Exercises Standing Tandem Gait: Forward;1 rep;Limitations Tandem Gait Limitations: partial tandem gait Retro Gait: 1 rep Sidestepping: 1 rep Marching: 10 reps;Solid surface;Hand held assist (HHA) 2 Heel Raises: 20 reps Toe Raise: 15 reps Sit to Stand: Standard surface;Limitations Sit to Stand Limitations: 5X without UE's from 18" surface Other Standing Exercises: partial tandem stance 2x 30" no HHA Other Standing Exercises: mini squat x 10    Seated Other Seated Exercises: Nustep L3 hills #3, 10 minutes keeping SPM above 80 Other Seated Exercises: scapular retraction 10x 5", Row 10x 5"   Physical Therapy Assessment and Plan PT Assessment and Plan Clinical Impression Statement: LE strength progressing well, pt with improved controlled descent with STS this session, able to stand with min guard/assistance from 18in chair.  Pt continued to require multimodal cueing to improve posture throughout session.  Added seated postural strengthening exercises.  Progressed balance training with partial tandem stance and gait to improve balance on more narrow BOS, min-mod assistance required for LOB episodes.   PT Plan: Progress exercises to improve posture and balance to include but not limited to  static standing to improve balance and posture; tandem and retro gait, side steping ; t-band for posture and gait training with BWS.    Goals Home Exercise Program Pt/caregiver will Perform Home Exercise Program: For increased strengthening PT Short Term Goals Time to Complete Short Term Goals: 2 weeks PT Short Term Goal 1: Berg improved by 5  PT Short Term Goal 1 - Progress: Progressing toward goal PT Short Term Goal 2: Pt to be able to come sit to stand with two attempts I PT Short Term Goal 2 - Progress: Progressing toward goal PT Short Term Goal 3: Pt to be able to walk for 10 minutes  PT Long Term Goals Time to Complete Long Term Goals: 4 weeks PT Long Term Goal 1: Pt Berg to be improved 10 points  PT Long Term Goal 2: Pt to have had no falls  Long Term Goal 3: I in advance HEP Long Term Goal 4: Pt to be walking for 20 minutes   Problem List Patient Active Problem List   Diagnosis Date Noted  . Lack of coordination 09/18/2013  . Poor balance 09/11/2013  . Difficulty in walking(719.7) 09/11/2013  . Hx of falling 09/11/2013  . Encounter for driver's license history and physical 08/27/2013  . Routine general medical examination at a health care facility 02/06/2013  . CVA (cerebral vascular accident) 09/07/2012  . CKD (chronic kidney disease) stage 4, GFR 15-29 ml/min 09/07/2012  . Anemia 09/07/2012  . Cholelithiasis 08/10/2012  . Medically noncompliant 07/04/2012  . Shoulder pain, right 06/28/2012  . HTN (hypertension) 03/15/2012  . Bradycardia 03/15/2012  . Peripheral autonomic neuropathy due to diabetes mellitus 03/14/2012  . Complex partial seizure disorder 03/07/2012  . Right hand weakness 02/04/2012  . Falls frequently 07/13/2011  . DM (diabetes  mellitus) type II uncontrolled with renal manifestation 11/26/2010  . FATIGUE 01/01/2010  . UNSTEADY GAIT 11/20/2009  . NUMBNESS 11/20/2009  . HYPOTHYROIDISM 03/09/2008  . HYPERLIPIDEMIA 03/09/2008  . OBESITY 03/09/2008   . HYPERTENSION 03/09/2008  . PERIPHERAL VASCULAR DISEASE 03/09/2008  . DEGENERATIVE JOINT DISEASE, KNEE 01/26/2008  . DERANGEMENT MENISCUS 01/26/2008  . KNEE PAIN 01/26/2008  . SPONDYLOSIS 01/26/2008  . SPINAL STENOSIS 01/26/2008    PT - End of Session Equipment Utilized During Treatment: Gait belt Activity Tolerance: Patient tolerated treatment well General Behavior During Therapy: [facility] for tasks assessed/performed  GP    Aldona Lento 09/27/2013, 3:11 PM

## 2013-09-29 ENCOUNTER — Ambulatory Visit (HOSPITAL_COMMUNITY)
Admission: RE | Admit: 2013-09-29 | Discharge: 2013-09-29 | Disposition: A | Discharge status: Home / Self Care | Payer: Medicare Other | Source: Ambulatory Visit | Attending: Family Medicine | Admitting: Family Medicine

## 2013-09-29 ENCOUNTER — Ambulatory Visit (HOSPITAL_COMMUNITY): Payer: Medicare Other

## 2013-09-29 DIAGNOSIS — R2689 Other abnormalities of gait and mobility: Secondary | ICD-10-CM

## 2013-09-29 DIAGNOSIS — Z9181 History of falling: Secondary | ICD-10-CM

## 2013-09-29 DIAGNOSIS — R262 Difficulty in walking, not elsewhere classified: Secondary | ICD-10-CM

## 2013-09-29 DIAGNOSIS — L97509 Non-pressure chronic ulcer of other part of unspecified foot with unspecified severity: Secondary | ICD-10-CM | POA: Diagnosis not present

## 2013-09-29 NOTE — Progress Notes (Signed)
Physical Therapy Treatment Patient Details  Name: Chase [name] MRN: [id number] Date of Birth: November 23, 1931  Today's Date: 09/29/2013 Time: [id number] PT Time Calculation (min): 64 min Charge: Gait 1421-1500, NMR 1300-1312, TE [id number]  Visit#: 8 of 12  Re-eval: 10/11/13 Assessment Diagnosis: peripheral neuropathy Next MD Visit: 10/09/2013 Prior Therapy: none  Authorization: medicare  Authorization Time Period:    Authorization Visit#: 8 of 10   Subjective: Symptoms/Limitations Symptoms: Pt stated back pain today 3/10, reports pain in Lt knee with gait.   Pain Assessment Currently in Pain?: Yes Pain Score: 3  Pain Location: Back Multiple Pain Sites: Yes  Precautions/Restrictions  Precautions Precautions: Fall  Exercise/Treatments Balance Exercises Standing Heel Raises: 20 reps Toe Raise: 15 reps Sit to Stand Limitations: 5X without UE's from 18" surface Other Standing Exercises: partial tandem stance 2x 60" no HHA, Side stepping with visual cueing for posture and vc-ing to increase stride length. Other Standing Exercises: Gait training on TM with BWS 4' @ 1.0, 3' @ 0.8, 4' @ 1.0; cueing for posture and to increase stride length  Yoga Poses    Seated Other Seated Exercises: Nustep L3 hills #3, 10 minutes keeping SPM above 80  Supine       Physical Therapy Assessment and Plan PT Assessment and Plan Clinical Impression Statement: Began gait training on TM with BWS to improve gait mechanics with multimodal cueing for posture, heel to toe gait pattern, and to ncrease stride length.  Added mirror infront of TM with improved body awareness, improved posture with less cueing following visual aide as well.   PT Plan: Progress exercises to improve posture and balance to include but not limited to static standing to improve balance and posture; tandem and retro gait, side steping ; t-band for posture and gait training with BWS.    Goals Home Exercise Program Pt/caregiver  will Perform Home Exercise Program: For increased strengthening PT Short Term Goals Time to Complete Short Term Goals: 2 weeks PT Short Term Goal 1: Berg improved by 5  PT Short Term Goal 1 - Progress: Progressing toward goal PT Short Term Goal 2: Pt to be able to come sit to stand with two attempts I PT Short Term Goal 2 - Progress: Progressing toward goal PT Short Term Goal 3: Pt to be able to walk for 10 minutes  PT Short Term Goal 3 - Progress: Progressing toward goal PT Long Term Goals Time to Complete Long Term Goals: 4 weeks PT Long Term Goal 1: Pt Berg to be improved 10 points  PT Long Term Goal 2: Pt to have had no falls  Long Term Goal 3: I in advance HEP Long Term Goal 4: Pt to be walking for 20 minutes   Problem List Patient Active Problem List   Diagnosis Date Noted  . Lack of coordination 09/18/2013  . Poor balance 09/11/2013  . Difficulty in walking(719.7) 09/11/2013  . Hx of falling 09/11/2013  . Encounter for driver's license history and physical 08/27/2013  . Routine general medical examination at a health care facility 02/06/2013  . CVA (cerebral vascular accident) 09/07/2012  . CKD (chronic kidney disease) stage 4, GFR 15-29 ml/min 09/07/2012  . Anemia 09/07/2012  . Cholelithiasis 08/10/2012  . Medically noncompliant 07/04/2012  . Shoulder pain, right 06/28/2012  . HTN (hypertension) 03/15/2012  . Bradycardia 03/15/2012  . Peripheral autonomic neuropathy due to diabetes mellitus 03/14/2012  . Complex partial seizure disorder 03/07/2012  . Right hand weakness 02/04/2012  . Falls frequently 07/13/2011  .  DM (diabetes mellitus) type II uncontrolled with renal manifestation 11/26/2010  . FATIGUE 01/01/2010  . UNSTEADY GAIT 11/20/2009  . NUMBNESS 11/20/2009  . HYPOTHYROIDISM 03/09/2008  . HYPERLIPIDEMIA 03/09/2008  . OBESITY 03/09/2008  . HYPERTENSION 03/09/2008  . PERIPHERAL VASCULAR DISEASE 03/09/2008  . DEGENERATIVE JOINT DISEASE, KNEE 01/26/2008  .  DERANGEMENT MENISCUS 01/26/2008  . KNEE PAIN 01/26/2008  . SPONDYLOSIS 01/26/2008  . SPINAL STENOSIS 01/26/2008    PT - End of Session Equipment Utilized During Treatment: Gait belt Activity Tolerance: Patient tolerated treatment well;Patient limited by fatigue General Behavior During Therapy: [facility] for tasks assessed/performed  GP    Aldona Lento 09/29/2013, 3:41 PM

## 2013-10-02 ENCOUNTER — Ambulatory Visit (HOSPITAL_COMMUNITY)
Admission: RE | Admit: 2013-10-02 | Discharge: 2013-10-02 | Disposition: A | Discharge status: Home / Self Care | Payer: Medicare Other | Source: Ambulatory Visit | Attending: Family Medicine | Admitting: Family Medicine

## 2013-10-02 ENCOUNTER — Inpatient Hospital Stay (HOSPITAL_COMMUNITY): Admission: RE | Admit: 2013-10-02 | Payer: Medicare Other | Source: Ambulatory Visit | Admitting: Specialist

## 2013-10-02 DIAGNOSIS — Z9181 History of falling: Secondary | ICD-10-CM

## 2013-10-02 DIAGNOSIS — R2689 Other abnormalities of gait and mobility: Secondary | ICD-10-CM

## 2013-10-02 DIAGNOSIS — R262 Difficulty in walking, not elsewhere classified: Secondary | ICD-10-CM

## 2013-10-02 NOTE — Progress Notes (Signed)
Physical Therapy Treatment Patient Details  Name: Chase [name] MRN: [id number] Date of Birth: 1931-10-20  Today's Date: 10/02/2013 Time: 1350-1440 PT Time Calculation (min): 50 min Charge Neuromuscular 1350-1325; there ex 1325-1340 Visit#: 9 of 12  Re-eval: 10/11/13    Authorization: medicare  Authorization Visit#: 9 of 10   Subjective: Symptoms/Limitations Symptoms: Pt states he is sore. Pain Assessment Currently in Pain?: Yes Pain Score: 3  Pain Location: Leg Pain Orientation: Right;Left      Balance Exercises Standing Tandem Stance: Eyes open;2 reps;Limitations Tandem Stance Limitations: small step forward SLS: Eyes open;5 reps;10 secs;Limitations SLS Limitations: min-mod assist  Cone Rotation: Solid surface Marching: Solid surface;10 reps Heel Raises: 15 reps Toe Raise: 10 reps Sit to Stand Limitations: 8x Other Standing Exercises: alternate toe tap on 6"box x 10     Seated Other Seated Exercises: Nustep L3 x 10:00 hills       Physical Therapy Assessment and Plan PT Assessment and Plan Clinical Impression Statement: Pt showig improved posture and step length with gait. Added cone rotation and step up to improve dynamic balance. PT Plan: G-code due next session.    Goals  progressing  Problem List Patient Active Problem List   Diagnosis Date Noted  . Lack of coordination 09/18/2013  . Poor balance 09/11/2013  . Difficulty in walking(719.7) 09/11/2013  . Hx of falling 09/11/2013  . Encounter for driver's license history and physical 08/27/2013  . Routine general medical examination at a health care facility 02/06/2013  . CVA (cerebral vascular accident) 09/07/2012  . CKD (chronic kidney disease) stage 4, GFR 15-29 ml/min 09/07/2012  . Anemia 09/07/2012  . Cholelithiasis 08/10/2012  . Medically noncompliant 07/04/2012  . Shoulder pain, right 06/28/2012  . HTN (hypertension) 03/15/2012  . Bradycardia 03/15/2012  . Peripheral autonomic neuropathy due  to diabetes mellitus 03/14/2012  . Complex partial seizure disorder 03/07/2012  . Right hand weakness 02/04/2012  . Falls frequently 07/13/2011  . DM (diabetes mellitus) type II uncontrolled with renal manifestation 11/26/2010  . FATIGUE 01/01/2010  . UNSTEADY GAIT 11/20/2009  . NUMBNESS 11/20/2009  . HYPOTHYROIDISM 03/09/2008  . HYPERLIPIDEMIA 03/09/2008  . OBESITY 03/09/2008  . HYPERTENSION 03/09/2008  . PERIPHERAL VASCULAR DISEASE 03/09/2008  . DEGENERATIVE JOINT DISEASE, KNEE 01/26/2008  . DERANGEMENT MENISCUS 01/26/2008  . KNEE PAIN 01/26/2008  . SPONDYLOSIS 01/26/2008  . SPINAL STENOSIS 01/26/2008    PT - End of Session Equipment Utilized During Treatment: Gait belt Activity Tolerance: Patient tolerated treatment well;Patient limited by fatigue  GP    Khaliah Barnick,CINDY 10/02/2013, 2:52 PM

## 2013-10-03 ENCOUNTER — Encounter: Payer: Self-pay | Admitting: Cardiovascular Disease

## 2013-10-03 ENCOUNTER — Ambulatory Visit (INDEPENDENT_AMBULATORY_CARE_PROVIDER_SITE_OTHER): Payer: Medicare Other | Admitting: Cardiovascular Disease

## 2013-10-03 VITALS — BP 150/80 | HR 66 | Ht 67.0 in | Wt 189.8 lb

## 2013-10-03 DIAGNOSIS — N189 Chronic kidney disease, unspecified: Secondary | ICD-10-CM

## 2013-10-03 DIAGNOSIS — H34212 Partial retinal artery occlusion, left eye: Secondary | ICD-10-CM

## 2013-10-03 DIAGNOSIS — I739 Peripheral vascular disease, unspecified: Secondary | ICD-10-CM

## 2013-10-03 DIAGNOSIS — H34219 Partial retinal artery occlusion, unspecified eye: Secondary | ICD-10-CM | POA: Diagnosis not present

## 2013-10-03 DIAGNOSIS — E7800 Pure hypercholesterolemia, unspecified: Secondary | ICD-10-CM | POA: Diagnosis not present

## 2013-10-03 DIAGNOSIS — I10 Essential (primary) hypertension: Secondary | ICD-10-CM | POA: Diagnosis not present

## 2013-10-03 NOTE — Patient Instructions (Addendum)
Your physician recommends that you schedule a follow-up appointment in: 6 MONTHS WITH Dr. Lucas Mallow WITH NURSE TO BRING IN YOUR MEDICATION BOTTLES TO BE ADJUSTED IF NECCESSARY   Your physician has requested that you have a carotid duplex. This test is an ultrasound of the carotid arteries in your neck. It looks at blood flow through these arteries that supply the brain with blood. Allow one hour for this exam. There are no restrictions or special instructions. WE WILL CALL YOU WITH YOUR TEST RESULTS/INSTRUCTIONS/NEXT STEPS ONCE RECEIVED BY THE PROVIDER  IT IS IMPERATIVE TO YOUR CARE TO HAVE THIS TEST PERFORMED AS ADVISED   THE PHONE NUMBER TO YOUR NEPHROLOGIST IS CURRENTLY BEING LOCATED, PLEASE BRING IN YOUR DOCTOR INFORMATION WITH YOU TOMORROW

## 2013-10-03 NOTE — Progress Notes (Signed)
Patient ID: Chase [name], male   DOB: [birth year], 79 y.o.   MRN: [id number]      SUBJECTIVE: Chase [name] is an 79 yr old man with an extensive PMH which includes CVA, CKD, hypothyroidism, HTN, hypercholesterolemia, and IDDM. His most recent echocardiogram performed on 09-08-2012 revealed the following:  - Left ventricle: The cavity size was normal. There was moderate to severe concentric hypertrophy. Systolic function was vigorous. The estimated ejection fraction was in the range of 65% to 70%. Wall motion was normal. - Aortic valve: Mildly to moderately calcified annulus. Mildly thickened leaflets. - Mitral valve: Calcified annulus. - Left atrium: The atrium was mildly to moderately dilated. - Right ventricle: The cavity size was normal. Wall thickness was increased. - Right atrium: The atrium was mildly dilated. - Pericardium, extracardiac: A trivial pericardial effusion was identified posterior to the heart.  He never got the carotid Dopplers completed. He and his wife are here, and they've forgotten to bring his updated medication list with them.  He denies chest pain and shortness of breath.   A recent BMET revealed BUN/creatinine of 44/2.52 on 09/20/2013.     Allergies  Allergen Reactions  . Bayer Advanced Aspirin [Aspirin] Nausea And Vomiting  . Penicillins Nausea And Vomiting    Current Outpatient Prescriptions  Medication Sig Dispense Refill  . ALPHAGAN P 0.1 % SOLN Place 1 drop into both eyes every 8 (eight) hours.       Marland Kitchen amLODipine (NORVASC) 10 MG tablet TAKE ONE TABLET BY MOUTH EVERY DAY  30 tablet  3  . aspirin EC 81 MG tablet Take 1 tablet (81 mg total) by mouth daily.  150 tablet  2  . chlorthalidone (HYGROTON) 25 MG tablet Dose increase effective 09/04/2013  One tablet once daily  30 tablet  2  . clopidogrel (PLAVIX) 75 MG tablet TAKE ONE TABLET BY MOUTH IN THE MORNING WITH BREAKFAST  30 tablet  4  . fluticasone (CUTIVATE) 0.05 % cream Apply 1 application  topically as needed.      . hydrALAZINE (APRESOLINE) 25 MG tablet One tablet once daily  Reduced dose effective 09/05/19-14  30 tablet  2  . insulin lispro protamine-insulin lispro (HUMALOG 75/25) (75-25) 100 UNIT/ML SUSP Inject 15 Units into the skin 2 (two) times daily with a meal.      . levETIRAcetam (KEPPRA) 500 MG tablet Take 250 mg by mouth at bedtime. Take ONE_HALF TAB BY MOUTH AT BEDTIME      . levothyroxine (SYNTHROID, LEVOTHROID) 150 MCG tablet TAKE ONE TABLET BY MOUTH EVERY DAY  30 tablet  2  . lovastatin (MEVACOR) 40 MG tablet TAKE ONE TABLET BY MOUTH AT BEDTIME  30 tablet  4  . Multiple Vitamins-Minerals (ICAPS MV PO) Take by mouth.      . silver sulfADIAZINE (SILVADENE) 1 % cream        No current facility-administered medications for this visit.    Past Medical History  Diagnosis Date  . Peripheral vascular disease, unspecified   . Depressive disorder, not elsewhere classified   . Obesity   . Other and unspecified hyperlipidemia   . Pain in joint, lower leg   . Osteoarthrosis, unspecified whether generalized or localized, lower leg   . Derangement of meniscus, not elsewhere classified   . Lumbago   . Spondylosis of unspecified site without mention of myelopathy   . Spinal stenosis, unspecified region other than cervical   . Backache, unspecified   . Family history of diabetes mellitus   .  Seizures   . Complete lesion of cervical spinal cord 03/14/3012    Stable since 2006  . Lacunar stroke, acute 03/14/2012  . Bradycardia 03/15/2012  . Gait disorder   . Knee contracture   . Diabetic neuropathy   . CKD (chronic kidney disease) stage 3, GFR 30-59 ml/min   . CKD (chronic kidney disease) stage 4, GFR 15-29 ml/min   . Diabetes mellitus approx 1994  . Hypothyroidism approx 2000  . Unspecified hypothyroidism   . Hypertension approx 1994  . Unspecified essential hypertension   . CVA (cerebrovascular accident) 09/07/12    Past Surgical History  Procedure Laterality Date   . Kidney stones left x2  1975  . Kidney surgery      Ruptured left kidney 30 yrs ago  from a kidney stone  . Colonoscopy N/A 08/10/2013    Procedure: COLONOSCOPY;  Surgeon: Rogene Houston, MD;  Location: [facility];  Service: Endoscopy;  Laterality: N/A;  240    History   Social History  . Marital Status: Married    Spouse Name: N/A    Number of Children: 63  . Years of Education: N/A   Occupational History  . retired     Social History Main Topics  . Smoking status: Never Smoker   . Smokeless tobacco: Not on file  . Alcohol Use: No  . Drug Use: No  . Sexual Activity: No   Other Topics Concern  . Not on file   Social History Narrative  . No narrative on file     Filed Vitals:   10/03/13 1320  BP: 150/80  Pulse: 66  Height: 5\' 7"  (1.702 m)  Weight: 189 lb 12 oz (86.07 kg)  SpO2: 96%    PHYSICAL EXAM General: NAD Neck: No JVD, no thyromegaly or thyroid nodule.  Lungs: Clear to auscultation bilaterally with normal respiratory effort. CV: Nondisplaced PMI.  Heart regular S1/S2, no S3/S4, no murmur.  No peripheral edema.  No carotid bruit.  Normal pedal pulses.  Abdomen: Soft, nontender, no hepatosplenomegaly, no distention.  Neurologic: Alert and oriented x 3.  Psych: Normal affect. Extremities: No clubbing or cyanosis. Left leg bandaged.  ECG: reviewed and available in electronic records.      ASSESSMENT AND PLAN: 1. Essential HTN:  I will continue Chlorthalidone 12.5 mg daily with close renal function monitoring, given his h/o CKD. Given the degree of left ventricular hypertrophy and moderate hypertensive retinopathy, I suspect his HTN is chronic and suboptimally controlled. Continue Amlodipine.  I will make adjustments as necessary until they bring in the med list on 10/29. Dr. Griffin Dakin note mentions that his HTN is under control, thus I am not inclined to make any changes at present. 2. Left retinal arteriole cholesterol embolus: I will continue ASA 81  mg daily, in addition to his Plavix. Will check carotid Dopplers, as agreed upon by Dr. Herbert Deaner, his ophthalmologist. I have reminded both he and his wife regarding the importance of getting this completed. 3. Hypercholesterolemia: Continue Lovastatin 40 mg daily for now.  4. CKD: continue to monitor. I will help arrange outpatient nephrology f/u, as he apparently used to f/u with one.   Kate Sable, M.D., F.A.C.C.

## 2013-10-04 ENCOUNTER — Ambulatory Visit (HOSPITAL_COMMUNITY)
Admission: RE | Admit: 2013-10-04 | Discharge: 2013-10-04 | Disposition: A | Discharge status: Home / Self Care | Payer: Medicare Other | Source: Ambulatory Visit | Attending: Cardiovascular Disease | Admitting: Cardiovascular Disease

## 2013-10-04 ENCOUNTER — Ambulatory Visit (INDEPENDENT_AMBULATORY_CARE_PROVIDER_SITE_OTHER): Payer: Medicare Other | Admitting: *Deleted

## 2013-10-04 ENCOUNTER — Ambulatory Visit (HOSPITAL_COMMUNITY)
Admission: RE | Admit: 2013-10-04 | Discharge: 2013-10-04 | Disposition: A | Discharge status: Home / Self Care | Payer: Medicare Other | Source: Ambulatory Visit | Attending: Family Medicine | Admitting: Family Medicine

## 2013-10-04 VITALS — BP 142/70 | HR 85 | Ht 67.0 in | Wt 192.0 lb

## 2013-10-04 DIAGNOSIS — I6529 Occlusion and stenosis of unspecified carotid artery: Secondary | ICD-10-CM | POA: Insufficient documentation

## 2013-10-04 DIAGNOSIS — H349 Unspecified retinal vascular occlusion: Secondary | ICD-10-CM | POA: Insufficient documentation

## 2013-10-04 DIAGNOSIS — I10 Essential (primary) hypertension: Secondary | ICD-10-CM

## 2013-10-04 DIAGNOSIS — H34212 Partial retinal artery occlusion, left eye: Secondary | ICD-10-CM

## 2013-10-04 DIAGNOSIS — I658 Occlusion and stenosis of other precerebral arteries: Secondary | ICD-10-CM | POA: Diagnosis not present

## 2013-10-04 DIAGNOSIS — E7800 Pure hypercholesterolemia, unspecified: Secondary | ICD-10-CM

## 2013-10-04 DIAGNOSIS — I739 Peripheral vascular disease, unspecified: Secondary | ICD-10-CM

## 2013-10-04 IMAGING — CT CT HEAD W/O CM
1 series · 16 of 30 positions shown, 20 images · non-contrast
Comparison: MRI brain 12/11/2009

CLINICAL DATA: Altered mental status.  Right-sided hand numbness
and weakness.  Right leg numbness.  Falls.  Dizziness and weakness

CT HEAD WITHOUT CONTRAST
TECHNIQUE: Contiguous axial images were obtained from the base of
the skull through the vertex without contrast.

[Series 2: headseq 4.8 h37s · axial · 0.43mm/px · z∈[-53,+102]mm · 16 of 36 slices shown, 20 images]
[im 2/36  brain]
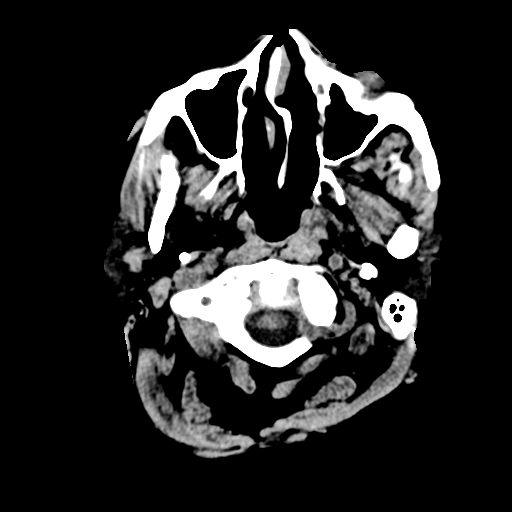
[im 2/36  bone]
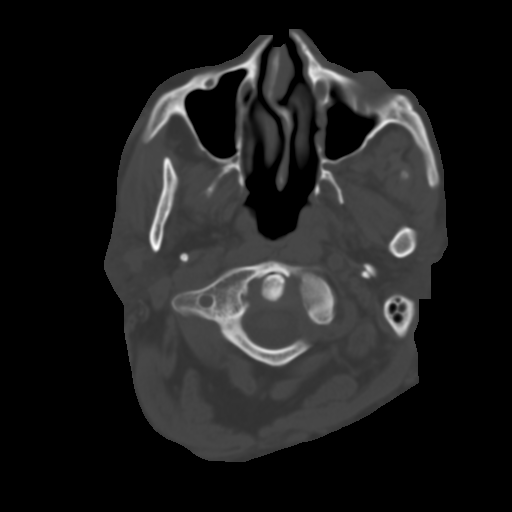
[im 4/36  brain]
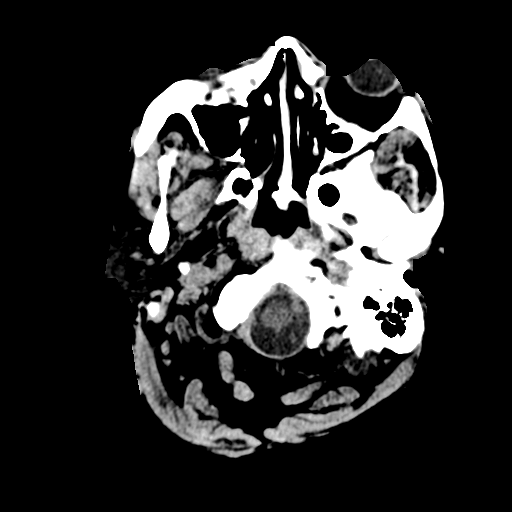
[im 7/36  brain]
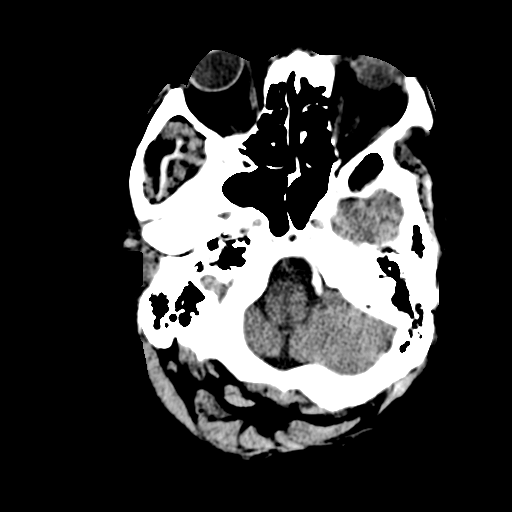
[im 9/36  brain]
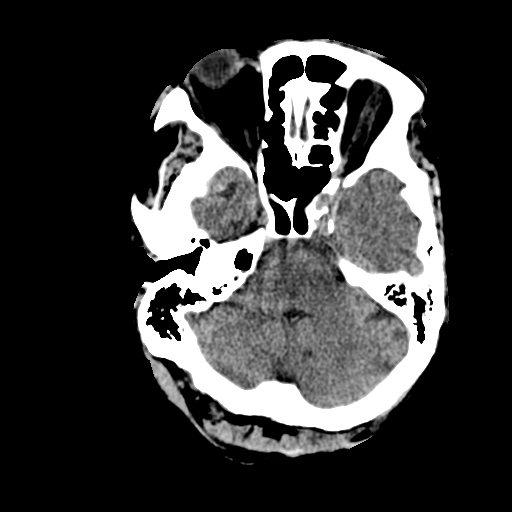
[im 10/36  brain]
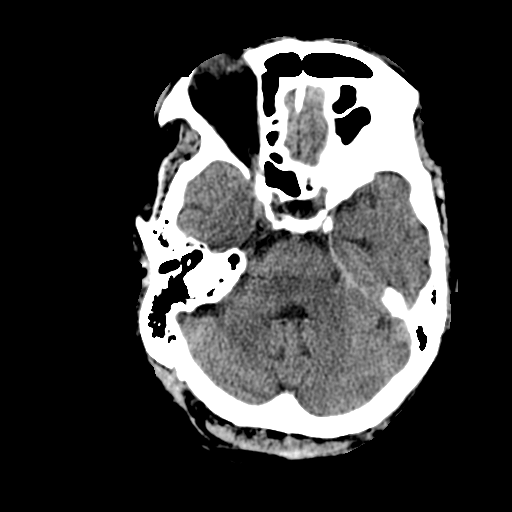
[im 10/36  bone]
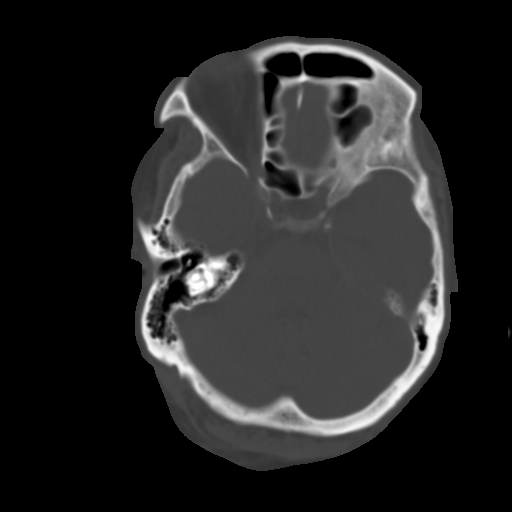
[im 13/36  brain]
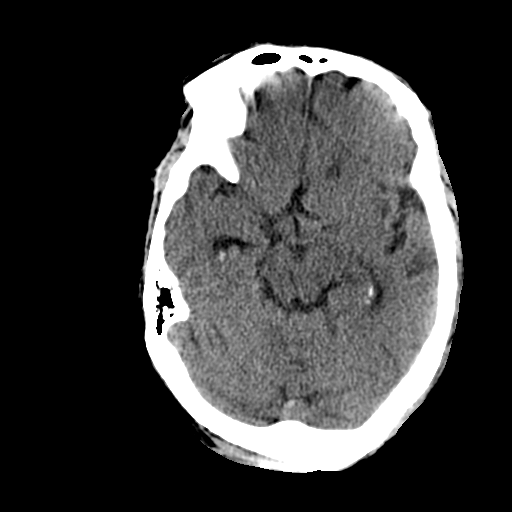
[im 15/36  brain]
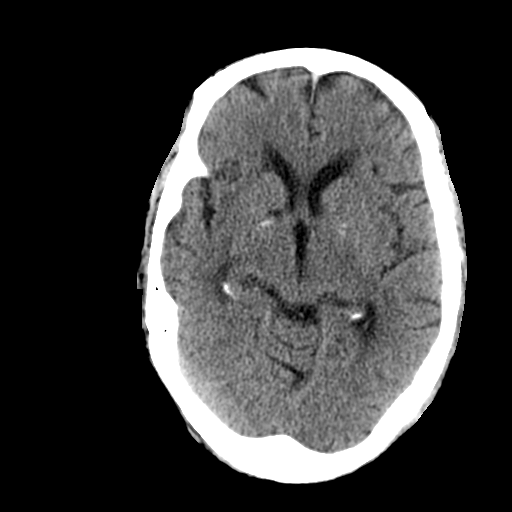
[im 17/36  brain]
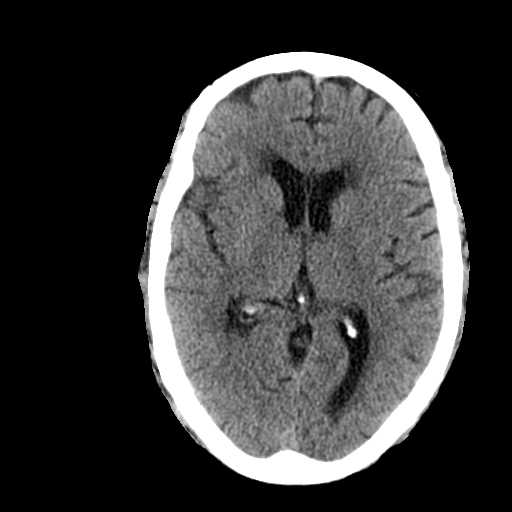
[im 19/36  brain]
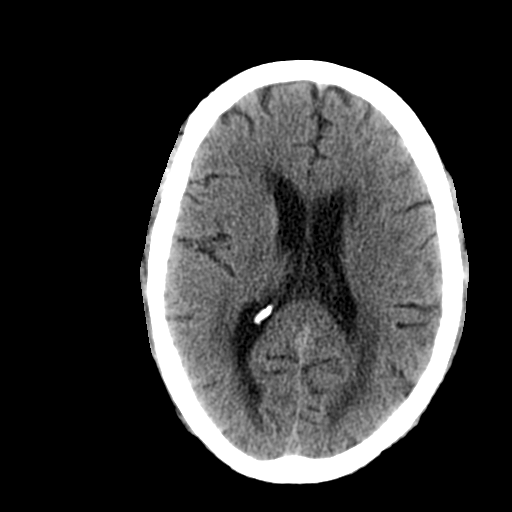
[im 19/36  bone]
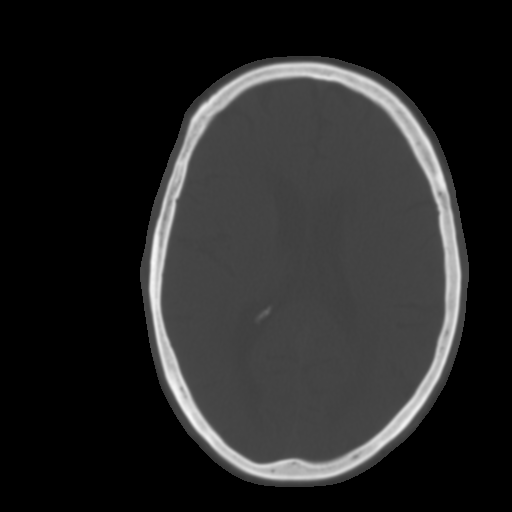
[im 21/36  brain]
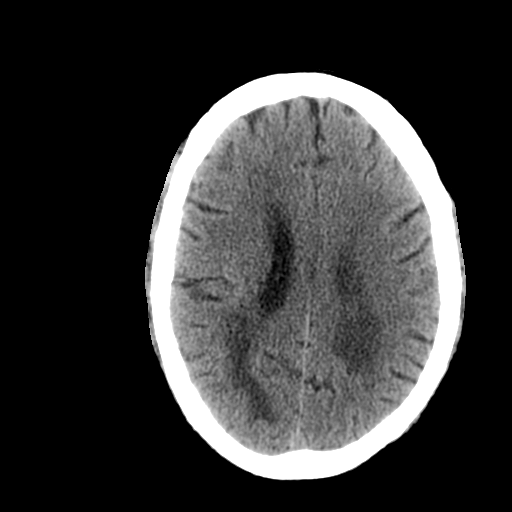
[im 23/36  brain]
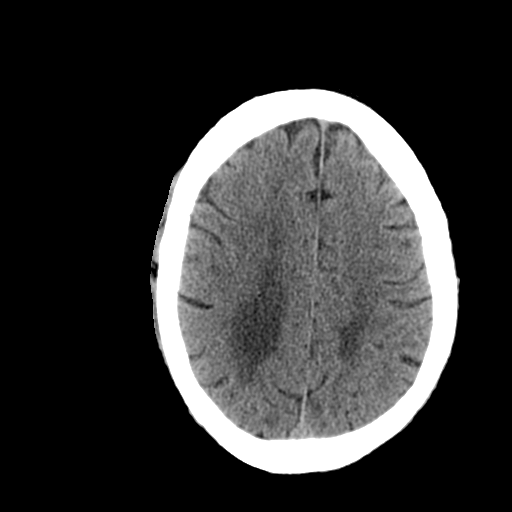
[im 26/36  brain]
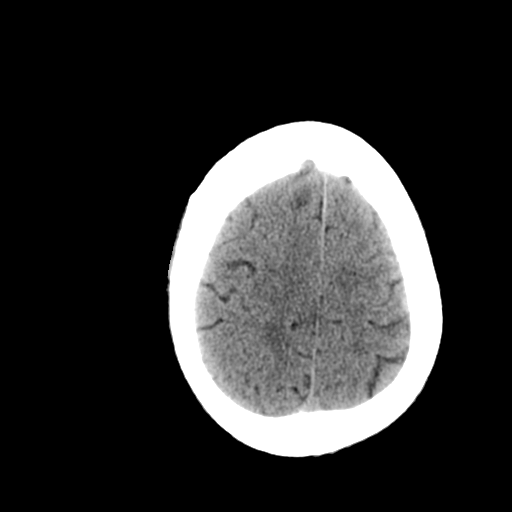
[im 27/36  brain]
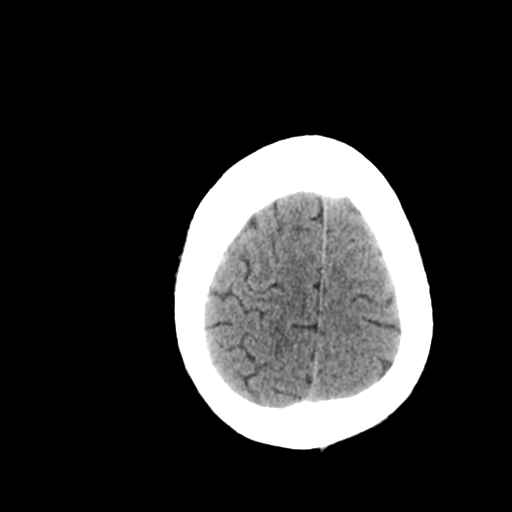
[im 27/36  bone]
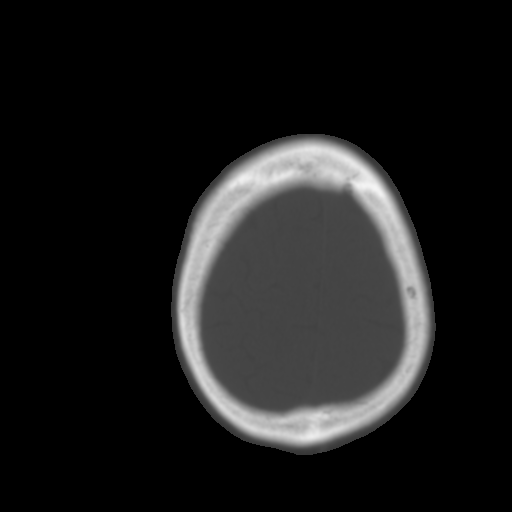
[im 29/36  brain]
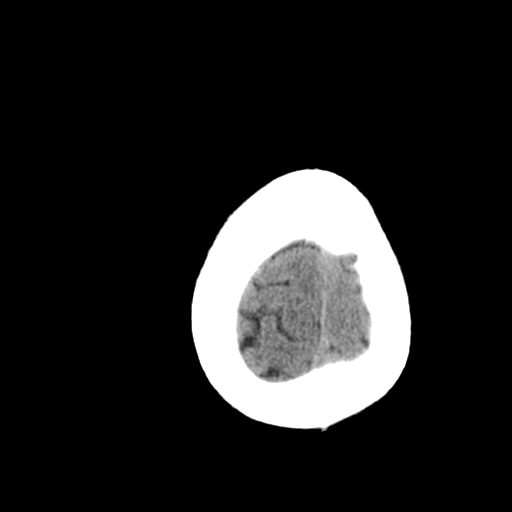
[im 32/36  brain]
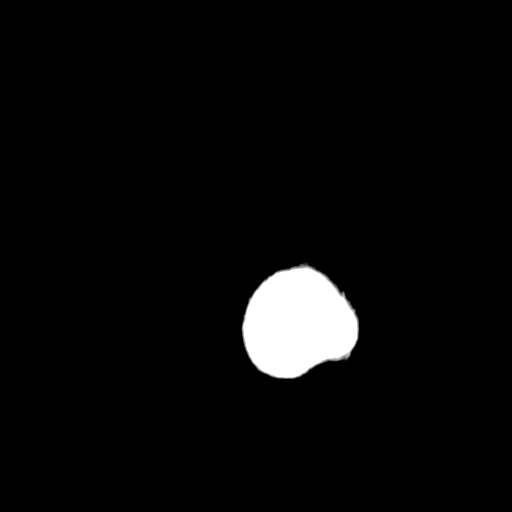
[im 34/36  brain]
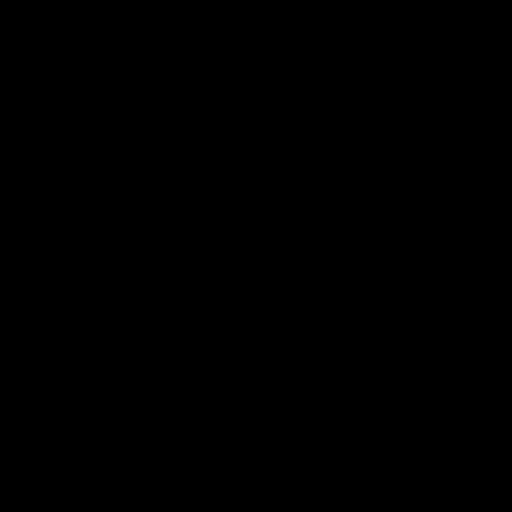

[16 of 30 positions shown; findings below may reference images not displayed]

FINDINGS: Mild diffuse cerebral atrophy.  Low attenuation changes
throughout the deep white matter consistent with small vessel
ischemia.  Mild ventricular dilatation consistent with central
atrophy.  Old lacunar infarcts in the basal ganglia.  No mass
effect or midline shift.  No focal sulci effacement to suggest
edema.  Gray-white matter junctions are distinct.  Basal cisterns
are not effaced.  No evidence of acute intracranial hemorrhage.
Vascular calcifications.  Mucosal thickening in the maxillary
antra.  Visualized mastoid air cells are not opacified.  No
depressed skull fractures.
IMPRESSION: Chronic atrophy and small vessel ischemic changes.  No acute
intracranial abnormalities identified.

## 2013-10-04 NOTE — Progress Notes (Signed)
Pt present for blood pressure check. BP is 142/70 with HR of 85. Pt has no complaints this visit. All medications are accurate on his med list Please advise   Last OV : 150/80 66

## 2013-10-04 NOTE — Progress Notes (Signed)
Physical Therapy Treatment Patient Details  Name: Chase [name] MRN: [id number] Date of Birth: October 12, 1931  Today's Date: 10/04/2013 Time: [id number] PT Time Calculation (min): 48 min Charges: PPT x [id number]) Therex x [id number])   Visit#: 10 of 12  Re-eval: 10/11/13  Authorization: medicare  Authorization Visit#: 10 of 10   Subjective: Symptoms/Limitations Symptoms: Pt states that sit to stand is getting easier for him. Pain Assessment Currently in Pain?: No/denies  Exercise/Treatments Mobility/Balance  Berg Balance Test Sit to Stand: Able to stand using hands after several tries Standing Unsupported: Able to stand safely 2 minutes Sitting with Back Unsupported but Feet Supported on Floor or Stool: Able to sit safely and securely 2 minutes Stand to Sit: Controls descent by using hands Transfers: Able to transfer safely, definite need of hands Standing Unsupported with Eyes Closed: Able to stand 10 seconds safely Standing Ubsupported with Feet Together: Able to place feet together independently and stand for 1 minute with supervision From Standing, Reach Forward with Outstretched Arm: Can reach forward >12 cm safely (5") From Standing Position, Pick up Object from Floor: Able to pick up shoe, needs supervision From Standing Position, Turn to Look Behind Over each Shoulder: Turn sideways only but maintains balance Turn 360 Degrees: Able to turn 360 degrees safely but slowly Standing Unsupported, Alternately Place Feet on Step/Stool: Able to complete >2 steps/needs minimal assist Standing Unsupported, One Foot in Front: Able to plae foot ahead of the other independently and hold 30 seconds Standing on One Leg: Able to lift leg independently and hold equal to or more than 3 seconds Total Score: 39  Balance Exercises Standing Cone Rotation: Solid surface Marching: Solid surface;10 reps Heel Raises: 15 reps Toe Raise: 10 reps;Limitations Toe Raise Limitations:  alternating withotu UE assistance Sit to Stand Limitations: 10x  Seated Other Seated Exercises: Nustep L3 x 10:00 hills   Physical Therapy Assessment and Plan PT Assessment and Plan Clinical Impression Statement: Pt continues to progress well. BERG core improved 8 points. G-code goal changed to CI. Pt requires multimodal cueing to improve postural awareness. Pt displays improve strength and power with sit to stand. Overall pt is progressing well toward goals. PT Plan: Continue to progress functional strength and balance per PT POC.    Problem List Patient Active Problem List   Diagnosis Date Noted  . Lack of coordination 09/18/2013  . Poor balance 09/11/2013  . Difficulty in walking(719.7) 09/11/2013  . Hx of falling 09/11/2013  . Encounter for driver's license history and physical 08/27/2013  . Routine general medical examination at a health care facility 02/06/2013  . CVA (cerebral vascular accident) 09/07/2012  . CKD (chronic kidney disease) stage 4, GFR 15-29 ml/min 09/07/2012  . Anemia 09/07/2012  . Cholelithiasis 08/10/2012  . Medically noncompliant 07/04/2012  . Shoulder pain, right 06/28/2012  . HTN (hypertension) 03/15/2012  . Bradycardia 03/15/2012  . Peripheral autonomic neuropathy due to diabetes mellitus 03/14/2012  . Complex partial seizure disorder 03/07/2012  . Right hand weakness 02/04/2012  . Falls frequently 07/13/2011  . DM (diabetes mellitus) type II uncontrolled with renal manifestation 11/26/2010  . FATIGUE 01/01/2010  . UNSTEADY GAIT 11/20/2009  . NUMBNESS 11/20/2009  . HYPOTHYROIDISM 03/09/2008  . HYPERLIPIDEMIA 03/09/2008  . OBESITY 03/09/2008  . HYPERTENSION 03/09/2008  . PERIPHERAL VASCULAR DISEASE 03/09/2008  . DEGENERATIVE JOINT DISEASE, KNEE 01/26/2008  . DERANGEMENT MENISCUS 01/26/2008  . KNEE PAIN 01/26/2008  . SPONDYLOSIS 01/26/2008  . SPINAL STENOSIS 01/26/2008    PT - End of Session  Equipment Utilized During Treatment: Gait  belt Activity Tolerance: Patient tolerated treatment well;Patient limited by fatigue General Behavior During Therapy: WFL for tasks assessed/performed  GP Functional Assessment Tool Used: Berg Functional Limitation: Changing and maintaining body position Changing and Maintaining Body Position Current Status [id number]): At least 20 percent but less than 40 percent impaired, limited or restricted Changing and Maintaining Body Position Goal Status [id number]): At least 1 percent but less than 20 percent impaired, limited or restricted  Rachelle Hora, PTA  10/04/2013, 4:31 PM

## 2013-10-04 NOTE — Patient Instructions (Signed)
Your physician recommends that you schedule a follow-up appointment in: TO BE DETERMINED   

## 2013-10-06 ENCOUNTER — Ambulatory Visit (HOSPITAL_COMMUNITY)
Admission: RE | Admit: 2013-10-06 | Discharge: 2013-10-06 | Disposition: A | Discharge status: Home / Self Care | Payer: Medicare Other | Source: Ambulatory Visit | Attending: Family Medicine | Admitting: Family Medicine

## 2013-10-06 DIAGNOSIS — R279 Unspecified lack of coordination: Secondary | ICD-10-CM

## 2013-10-06 NOTE — Progress Notes (Signed)
Physical Therapy Treatment Patient Details  Name: Chase [name] MRN: [id number] Date of Birth: 15-Oct-1931  Today's Date: 10/06/2013 Time: [id number] PT Time Calculation (min): 47 min  Visit#: 11 of 12  Re-eval: 10/11/13    Authorization: medicare  Authorization Time Period:    Authorization Visit#: 11 of 20   Subjective: Symptoms/Limitations Symptoms: Pt states that everything is getting easier from him. Pain Assessment Currently in Pain?: No/denies     Exercise/Treatments  Balance Exercises Standing Tandem Stance: Eyes open;2 reps;Limitations Tandem Stance Limitations: small step forward SLS: Eyes open;5 reps;10 secs;Limitations SLS Limitations: min-mod assist  Marching: Solid surface;10 reps Heel Raises: 15 reps Toe Raise: 10 reps Sit to Stand Limitations: 8x Other Standing Exercises: alternate toe tap on 6"box x 10 Other Standing Exercises: retro ambulation  1 RT; rocker board x 2'   Seated Other Seated Exercises: Nustep L3 x 12:00 hills      Physical Therapy Assessment and Plan PT Assessment and Plan Clinical Impression Statement: Pt gait continues to improve with improved posture.  Pt progressing well PT Plan: mm test pt next treatment for update to MD    Goals  progressing  Problem List Patient Active Problem List   Diagnosis Date Noted  . Lack of coordination 09/18/2013  . Poor balance 09/11/2013  . Difficulty in walking(719.7) 09/11/2013  . Hx of falling 09/11/2013  . Encounter for driver's license history and physical 08/27/2013  . Routine general medical examination at a health care facility 02/06/2013  . CVA (cerebral vascular accident) 09/07/2012  . CKD (chronic kidney disease) stage 4, GFR 15-29 ml/min 09/07/2012  . Anemia 09/07/2012  . Cholelithiasis 08/10/2012  . Medically noncompliant 07/04/2012  . Shoulder pain, right 06/28/2012  . HTN (hypertension) 03/15/2012  . Bradycardia 03/15/2012  . Peripheral autonomic neuropathy due to  diabetes mellitus 03/14/2012  . Complex partial seizure disorder 03/07/2012  . Right hand weakness 02/04/2012  . Falls frequently 07/13/2011  . DM (diabetes mellitus) type II uncontrolled with renal manifestation 11/26/2010  . FATIGUE 01/01/2010  . UNSTEADY GAIT 11/20/2009  . NUMBNESS 11/20/2009  . HYPOTHYROIDISM 03/09/2008  . HYPERLIPIDEMIA 03/09/2008  . OBESITY 03/09/2008  . HYPERTENSION 03/09/2008  . PERIPHERAL VASCULAR DISEASE 03/09/2008  . DEGENERATIVE JOINT DISEASE, KNEE 01/26/2008  . DERANGEMENT MENISCUS 01/26/2008  . KNEE PAIN 01/26/2008  . SPONDYLOSIS 01/26/2008  . SPINAL STENOSIS 01/26/2008    PT - End of Session Activity Tolerance: Patient tolerated treatment well;Patient limited by fatigue General Behavior During Therapy: [facility] for tasks assessed/performed  GP    Ulyess Muto,CINDY 10/06/2013, 2:30 PM

## 2013-10-08 NOTE — Progress Notes (Signed)
Occupational Therapy Treatment Patient Details  Name: Chase [name] MRN: [id number] Date of Birth: 02/02/1931  Today's Date: 10/06/2013 Time: [id number] OT Time Calculation (min): 40 min Manual   1503-1518 (15')  TherExercises  [id number] (25') Visit#: 2 of 12  Re-eval: 10/16/13    Authorization: Medicare  Authorization Time Period: before 10th visit  Authorization Visit#: 2 of 10  Subjective Symptoms/Limitations Symptoms: S:  It just doesnt seem to want to do what i need it to do....  Pain Assessment Currently in Pain?: No/denies Pain Score: 6  Pain Location: Arm Pain Orientation: Right Effect of Pain on Daily Activities: Patient also with pain in his left leg, please refer to PT note as patient is also being followed by physical therapy       Exercise/Treatments   Seated Elevation: AAROM;15 reps Retraction: AAROM;15 reps Horizontal ABduction: AAROM;10 reps External Rotation: AAROM;10 reps Internal Rotation: AAROM;10 reps Flexion: AAROM;10 reps Abduction: AAROM;10 reps Stretches Table Stretch - Flexion:  (10 reps table slides ) Table Stretch - Abduction:  (10 reps table slides ) Other Shoulder Stretches: Handout given on table/towel stretches   Wrist Exercises Forearm Supination: AAROM;10 reps Forearm Pronation: AAROM;10 reps Wrist Flexion: AAROM;10 reps Wrist Extension: AAROM;10 reps     Hand Exercises Tendon Glides: educated on, return demo x 10 reps and verbalized good understanding.  handout given      Manual Therapy Manual Therapy: Myofascial release Myofascial Release: MFR to right upper arm, trapezius, and scapularis region to decrease fascial restrictions and increase joint mobility in a pain free zone  Occupational Therapy Assessment and Plan OT Assessment and Plan Clinical Impression Statement: A: Patient tolerated MFT and range exercises well this date.  Educated on towel/table slides and tendon glides with handouts given this date.  reports  decreased tightness in shoulder and hand following treatment OT Plan: P: ball stretches, MFR, AROM for RUE   Goals Short Term Goals Short Term Goal 1: Patient will be educated on HEP. Short Term Goal 1 Progress: Progressing toward goal Short Term Goal 2: Patient will improve RUE AROM by 10 degrees for greater use of RUE as dominant with functional activities.  Short Term Goal 2 Progress: Progressing toward goal Short Term Goal 3: Patient will increase right wrist strength to 4-/5 for greater ability to pick up bags of groceries. Short Term Goal 3 Progress: Progressing toward goal Short Term Goal 4: Patient will improve right grip strength by 10 pounds and pinch strength by 2 pounds for greater ability to open jars. Short Term Goal 4 Progress: Progressing toward goal Short Term Goal 5: Patient will complete nine hole peg test with his right hand without assistance from OTR/L Short Term Goal 5 Progress: Progressing toward goal Long Term Goals Long Term Goal 1: Patient will use his right arm as dominant with daily activities.  Long Term Goal 1 Progress: Progressing toward goal Long Term Goal 2: Patient will improve RUE AROM to [facility] for greater use of RUE as dominant with functional activities.  Long Term Goal 2 Progress: Progressing toward goal Long Term Goal 3: Patient will increase right wrist strength to 4+/5 for greater ability to pick up bags of groceries. Long Term Goal 3 Progress: Progressing toward goal Long Term Goal 4: Patient will improve right grip strength by 15 pounds and pinch strength by 4 pounds for greater ability to open water bottles. Long Term Goal 4 Progress: Progressing toward goal Long Term Goal 5: Patient will complete nine hole peg test with  his right hand in 3 minutes or less for greater ability to fasten buttons. Long Term Goal 5 Progress: Progressing toward goal Additional Long Term Goals?: Yes Long Term Goal 6: Patient will improve light touch sensation from  dulled to intact.  Long Term Goal 6 Progress: Progressing toward goal  Problem List Patient Active Problem List   Diagnosis Date Noted  . Lack of coordination 09/18/2013  . Poor balance 09/11/2013  . Difficulty in walking(719.7) 09/11/2013  . Hx of falling 09/11/2013  . Encounter for driver's license history and physical 08/27/2013  . Routine general medical examination at a health care facility 02/06/2013  . CVA (cerebral vascular accident) 09/07/2012  . CKD (chronic kidney disease) stage 4, GFR 15-29 ml/min 09/07/2012  . Anemia 09/07/2012  . Cholelithiasis 08/10/2012  . Medically noncompliant 07/04/2012  . Shoulder pain, right 06/28/2012  . HTN (hypertension) 03/15/2012  . Bradycardia 03/15/2012  . Peripheral autonomic neuropathy due to diabetes mellitus 03/14/2012  . Complex partial seizure disorder 03/07/2012  . Right hand weakness 02/04/2012  . Falls frequently 07/13/2011  . DM (diabetes mellitus) type II uncontrolled with renal manifestation 11/26/2010  . FATIGUE 01/01/2010  . UNSTEADY GAIT 11/20/2009  . NUMBNESS 11/20/2009  . HYPOTHYROIDISM 03/09/2008  . HYPERLIPIDEMIA 03/09/2008  . OBESITY 03/09/2008  . HYPERTENSION 03/09/2008  . PERIPHERAL VASCULAR DISEASE 03/09/2008  . DEGENERATIVE JOINT DISEASE, KNEE 01/26/2008  . DERANGEMENT MENISCUS 01/26/2008  . KNEE PAIN 01/26/2008  . SPONDYLOSIS 01/26/2008  . SPINAL STENOSIS 01/26/2008    End of Session Activity Tolerance: Patient tolerated treatment well General Behavior During Therapy: [facility] for tasks assessed/performed OT Plan of Care OT Home Exercise Plan: tendon glides and table/towel slides  OT Patient Instructions: handouts given    Donney Rankins, OTR/L 10/06/2013, 11:07 PM

## 2013-10-09 ENCOUNTER — Ambulatory Visit: Payer: Medicare Other | Admitting: Family Medicine

## 2013-10-12 ENCOUNTER — Telehealth: Payer: Self-pay | Admitting: *Deleted

## 2013-10-12 DIAGNOSIS — I10 Essential (primary) hypertension: Secondary | ICD-10-CM | POA: Diagnosis not present

## 2013-10-12 DIAGNOSIS — M25569 Pain in unspecified knee: Secondary | ICD-10-CM | POA: Diagnosis not present

## 2013-10-12 NOTE — Telephone Encounter (Signed)
caroltid ultra sound results from 10/04/13

## 2013-10-12 NOTE — Telephone Encounter (Signed)
Spoke to patient concerning lab/test results/instructions from provider. Patient understood.    

## 2013-10-15 NOTE — Assessment & Plan Note (Signed)
No seizure activity i over 6 month, managed by neurology

## 2013-10-15 NOTE — Assessment & Plan Note (Signed)
Controlled, no change in medication Hyperlipidemia:Low fat diet discussed and encouraged.  \ 

## 2013-10-15 NOTE — Assessment & Plan Note (Signed)
Uncontrolled, attempt made to simplify regime, though hydralazine prescribed 3 times daily, both pt and wife think this is" impossible to comply with", as he is

## 2013-10-15 NOTE — Assessment & Plan Note (Signed)
Followed by nephrology. 

## 2013-10-15 NOTE — Assessment & Plan Note (Signed)
Improved, managed by endo Patient advised to reduce carb and sweets, commit to regular physical activity, take meds as prescribed, test blood as directed, and attempt to lose weight, to improve blood sugar control.

## 2013-10-15 NOTE — Assessment & Plan Note (Signed)
Managed by endo need to review updated lab

## 2013-10-15 NOTE — Assessment & Plan Note (Signed)
Significant longstanding numbness in hands and feet  With poor grip and unsteady gait. All of these I believe his ability to safely drive , which I will state on his Dl application.  I am sending him to  PT in the interim and ask the driver's license office to ensure he has PT/Ot evaluation to help with final determination of road safety

## 2013-10-17 DIAGNOSIS — E348 Other specified endocrine disorders: Secondary | ICD-10-CM | POA: Diagnosis not present

## 2013-10-17 DIAGNOSIS — L97309 Non-pressure chronic ulcer of unspecified ankle with unspecified severity: Secondary | ICD-10-CM | POA: Diagnosis not present

## 2013-10-18 ENCOUNTER — Ambulatory Visit (HOSPITAL_COMMUNITY)
Admission: RE | Admit: 2013-10-18 | Discharge: 2013-10-18 | Disposition: A | Discharge status: Home / Self Care | Payer: Medicare Other | Source: Ambulatory Visit | Attending: Family Medicine | Admitting: Family Medicine

## 2013-10-18 DIAGNOSIS — Z9181 History of falling: Secondary | ICD-10-CM

## 2013-10-18 DIAGNOSIS — M25569 Pain in unspecified knee: Secondary | ICD-10-CM | POA: Insufficient documentation

## 2013-10-18 DIAGNOSIS — R269 Unspecified abnormalities of gait and mobility: Secondary | ICD-10-CM | POA: Insufficient documentation

## 2013-10-18 DIAGNOSIS — E1142 Type 2 diabetes mellitus with diabetic polyneuropathy: Secondary | ICD-10-CM | POA: Insufficient documentation

## 2013-10-18 DIAGNOSIS — R262 Difficulty in walking, not elsewhere classified: Secondary | ICD-10-CM

## 2013-10-18 DIAGNOSIS — R279 Unspecified lack of coordination: Secondary | ICD-10-CM

## 2013-10-18 DIAGNOSIS — I10 Essential (primary) hypertension: Secondary | ICD-10-CM | POA: Diagnosis not present

## 2013-10-18 DIAGNOSIS — E1149 Type 2 diabetes mellitus with other diabetic neurological complication: Secondary | ICD-10-CM | POA: Insufficient documentation

## 2013-10-18 DIAGNOSIS — R2689 Other abnormalities of gait and mobility: Secondary | ICD-10-CM

## 2013-10-18 DIAGNOSIS — IMO0001 Reserved for inherently not codable concepts without codable children: Secondary | ICD-10-CM | POA: Insufficient documentation

## 2013-10-18 NOTE — Evaluation (Addendum)
Physical Therapy Evaluation  Patient Details  Name: Chase [name] MRN: [id number] Date of Birth: [birth year]  Today's Date: 10/18/2013 Time: [id number] PT Time Calculation (min): 45 min Charge:  Mm test [id number]; neuro re-ed [id number]             Visit#: 12 of 24  Re-eval: 11/17/13 Assessment Diagnosis: peripheral neuropathy Next MD Visit: 10/09/2013 Prior Therapy: none  Authorization: medicare        Authorization Visit#: 12 of 20   Past Medical History:  Past Medical History  Diagnosis Date  . Peripheral vascular disease, unspecified   . Depressive disorder, not elsewhere classified   . Obesity   . Other and unspecified hyperlipidemia   . Pain in joint, lower leg   . Osteoarthrosis, unspecified whether generalized or localized, lower leg   . Derangement of meniscus, not elsewhere classified   . Lumbago   . Spondylosis of unspecified site without mention of myelopathy   . Spinal stenosis, unspecified region other than cervical   . Backache, unspecified   . Family history of diabetes mellitus   . Seizures   . Complete lesion of cervical spinal cord 03/14/3012    Stable since 2006  . Lacunar stroke, acute 03/14/2012  . Bradycardia 03/15/2012  . Gait disorder   . Knee contracture   . Diabetic neuropathy   . CKD (chronic kidney disease) stage 3, GFR 30-59 ml/min   . CKD (chronic kidney disease) stage 4, GFR 15-29 ml/min   . Diabetes mellitus approx 1994  . Hypothyroidism approx 2000  . Unspecified hypothyroidism   . Hypertension approx 1994  . Unspecified essential hypertension   . CVA (cerebrovascular accident) 09/07/12   Past Surgical History:  Past Surgical History  Procedure Laterality Date  . Kidney stones left x2  1975  . Kidney surgery      Ruptured left kidney 30 yrs ago  from a kidney stone  . Colonoscopy N/A 08/10/2013    Procedure: COLONOSCOPY;  Surgeon: Rogene Houston, MD;  Location: [facility];  Service: Endoscopy;  Laterality: N/A;  240     Subjective Symptoms/Limitations Symptoms: Pt states he has not been doing very much exercises at home due to being tired.   How long can you sit comfortably?: no problem How long can you stand comfortably?: no time at all How long can you walk comfortably?: Pt is able to walk for ten to 15 minutes now was less than five minutes. Pain Assessment Currently in Pain?: Yes Pain Score: 2  Pain Location: Shoulder Pain Orientation: Right Pain Type: Chronic pain  Precautions/Restrictions  Precautions Precautions: Fall  Assessment RLE Strength Right Hip Flexion: 5/5 Right Hip Extension: 4/5 (was 2-/5) Right Hip ABduction: 5/5 (was 4/5) Right Hip ADduction: 5/5 Right Knee Flexion: 5/5 Right Knee Extension: 5/5 (was 4-/5) Right Ankle Dorsiflexion: 5/5 LLE Strength Left Hip Flexion: 5/5 Left Hip Extension: 3+/5 (was 2-/5) Left Hip ABduction: 5/5 (was 4/5) Left Hip ADduction: 5/5 Left Knee Flexion: 5/5 Left Knee Extension: 5/5 (was 4-/5) Left Ankle Dorsiflexion: 5/5  Exercise/Treatments Mobility/Balance  Berg Balance Test Sit to Stand: Able to stand using hands after several tries Standing Unsupported: Able to stand safely 2 minutes Sitting with Back Unsupported but Feet Supported on Floor or Stool: Able to sit safely and securely 2 minutes Stand to Sit: Controls descent by using hands Transfers: Able to transfer safely, definite need of hands Standing Unsupported with Eyes Closed: Able to stand 10 seconds safely Standing Ubsupported with Feet Together:  Able to place feet together independently and stand for 1 minute with supervision From Standing, Reach Forward with Outstretched Arm: Can reach forward >12 cm safely (5") From Standing Position, Pick up Object from Floor: Able to pick up shoe, needs supervision From Standing Position, Turn to Look Behind Over each Shoulder: Turn sideways only but maintains balance Turn 360 Degrees: Able to turn 360 degrees safely but  slowly Standing Unsupported, Alternately Place Feet on Step/Stool: Able to complete >2 steps/needs minimal assist Standing Unsupported, One Foot in Front: Able to plae foot ahead of the other independently and hold 30 seconds Standing on One Leg: Able to lift leg independently and hold equal to or more than 3 seconds Total Score: 39  Was 31  Balance Exercises Standing SLS: Eyes open;5 reps Marching: Solid surface;10 reps  Seated Other Seated Exercises: bike L3 x 8:00  Supine Other Supine Exercises: Knee to chest and trunk rotation to decrease stiffness   Manual Therapy Manual Therapy: Myofascial release Myofascial Release: MFR to right upper arm, trapezius, and scapularis region to decrease fascial restrictions and increase joint mobility in a pain free zone  Physical Therapy Assessment and Plan PT Assessment and Plan Clinical Impression Statement: Pt has made good gains in strength with only gluts being at a strength that is not functional.  Ambulating ability continues to be limited by stiffness and balance.  Pt will continue to benefit from skilled servicies to work on these issues to maximize safety in ambulationl. Pt will benefit from skilled therapeutic intervention in order to improve on the following deficits: Decreased range of motion;Increased fascial restricitons;Impaired sensation;Increased muscle spasms;Decreased strength;Decreased coordination PT Plan: focus on glut strength, improving trunk rotation while walking and balance   Continue to see pt  Three times a week for four more weeks Goals Home Exercise Program Pt/caregiver will Perform Home Exercise Program: For increased strengthening PT Goal: Perform Home Exercise Program - Progress: Met PT Short Term Goals PT Short Term Goal 1: Berg improved by 5  PT Short Term Goal 1 - Progress: Met PT Short Term Goal 2: Pt to be able to come sit to stand with two attempts I PT Short Term Goal 2 - Progress: Met PT Short Term Goal  3: Pt to be able to walk for 10 minutes  PT Short Term Goal 3 - Progress: Met PT Long Term Goals Time to Complete Long Term Goals: 4 weeks PT Long Term Goal 1: Pt Berg to be improved 10 points  PT Long Term Goal 1 - Progress: Progressing toward goal PT Long Term Goal 2: Pt to have had no falls  PT Long Term Goal 2 - Progress: Met Long Term Goal 3: I in advance HEP Long Term Goal 3 Progress: Partly met Long Term Goal 4: Pt to be walking for 20 minutes  Long Term Goal 4 Progress: Progressing toward goal PT Long Term Goal 5: Pt to have only 10degree of forward bend when standing Long Term Goal 5 Progress: Progressing toward goal (able to pull up to erect position but stays forward bent 15 degrees without  reminder. ) Additional PT Long Term Goals?: Yes PT Long Term Goal 6: able to come sit to stand in one attempgt. Long Term Goal 6 Progress: Progressing toward goal  Problem List Patient Active Problem List   Diagnosis Date Noted  . Lack of coordination 09/18/2013  . Poor balance 09/11/2013  . Difficulty in walking(719.7) 09/11/2013  . Hx of falling 09/11/2013  . Encounter for  driver's license history and physical 08/27/2013  . Routine general medical examination at a health care facility 02/06/2013  . CVA (cerebral vascular accident) 09/07/2012  . CKD (chronic kidney disease) stage 4, GFR 15-29 ml/min 09/07/2012  . Anemia 09/07/2012  . Cholelithiasis 08/10/2012  . Medically noncompliant 07/04/2012  . Shoulder pain, right 06/28/2012  . HTN (hypertension) 03/15/2012  . Bradycardia 03/15/2012  . Peripheral autonomic neuropathy due to diabetes mellitus 03/14/2012  . Complex partial seizure disorder 03/07/2012  . Right hand weakness 02/04/2012  . Falls frequently 07/13/2011  . DM (diabetes mellitus) type II uncontrolled with renal manifestation 11/26/2010  . FATIGUE 01/01/2010  . UNSTEADY GAIT 11/20/2009  . NUMBNESS 11/20/2009  . HYPOTHYROIDISM 03/09/2008  . HYPERLIPIDEMIA  03/09/2008  . OBESITY 03/09/2008  . HYPERTENSION 03/09/2008  . PERIPHERAL VASCULAR DISEASE 03/09/2008  . DEGENERATIVE JOINT DISEASE, KNEE 01/26/2008  . DERANGEMENT MENISCUS 01/26/2008  . KNEE PAIN 01/26/2008  . SPONDYLOSIS 01/26/2008  . SPINAL STENOSIS 01/26/2008    PT - End of Session Equipment Utilized During Treatment: Gait belt Activity Tolerance: Patient tolerated treatment well General Behavior During Therapy: [facility] for tasks assessed/performed PT Plan of Care PT Home Exercise Plan: new sheet given  GP Functional Assessment Tool Used: Rolm Gala 10/18/2013, 4:44 PM  Physician Documentation Your signature is required to indicate approval of the treatment plan as stated above.  Please sign and either send electronically or make a copy of this report for your files and return this physician signed original.   Please mark one 1.__approve of plan  2. ___approve of plan with the following conditions.   ______________________________                                                          _____________________ Physician Signature                                                                                                             Date

## 2013-10-18 NOTE — Progress Notes (Signed)
Occupational Therapy Treatment Patient Details  Name: Chase [name] MRN: [id number] Date of Birth: 1930-12-08  Today's Date: 10/18/2013 Time: [id number] OT Time Calculation (min): 33 min Manual therapy [id number] 11' Therapeutic exercises [id number] 18' Visit#: 3 of 12  Re-eval: 10/23/13    Authorization: Medicare  Authorization Time Period: before 10th visit  Authorization Visit#: 3 of 10  Subjective Symptoms/Limitations Symptoms: S;  I think I can do a little better now. Pain Assessment Currently in Pain?: Yes Pain Score: 2  Pain Location: Shoulder Pain Orientation: Right Pain Type: Chronic pain  Precautions/Restrictions  Precautions Precautions: Fall  Exercise/Treatments Seated Protraction: PROM;AAROM;10 reps Horizontal ABduction: PROM;AAROM;10 reps External Rotation: PROM;AAROM;10 reps Internal Rotation: PROM;AAROM;10 reps Flexion: PROM;AAROM;10 reps Abduction: PROM;AAROM;10 reps   Elbow Exercises Elbow Flexion: PROM;AROM;10 reps Elbow Extension: PROM;AROM;10 reps Forearm Supination: PROM;AROM;10 reps Forearm Pronation: PROM;AROM;10 reps Wrist Flexion: PROM;AROM;10 reps Wrist Extension: PROM;AROM;10 reps         Manual Therapy Manual Therapy: Myofascial release Myofascial Release: MFR to right upper arm, trapezius, and scapularis region to decrease fascial restrictions and increase joint mobility in a pain free zone  Occupational Therapy Assessment and Plan OT Assessment and Plan Clinical Impression Statement: A:  Increased mobility noted in right arm and hand this date.  OT Plan: P:  Reassess, add theraputty for hand mobilty and upper body strengthening.    Goals Home Exercise Program Pt/caregiver will Perform Home Exercise Program: For increased strengthening PT Goal: Perform Home Exercise Program - Progress: Met Short Term Goals Short Term Goal 1: Patient will be educated on HEP. Short Term Goal 2: Patient will improve RUE AROM by 10 degrees for  greater use of RUE as dominant with functional activities.  Short Term Goal 3: Patient will increase right wrist strength to 4-/5 for greater ability to pick up bags of groceries. Short Term Goal 4: Patient will improve right grip strength by 10 pounds and pinch strength by 2 pounds for greater ability to open jars. Short Term Goal 5: Patient will complete nine hole peg test with his right hand without assistance from OTR/L Long Term Goals Long Term Goal 1: Patient will use his right arm as dominant with daily activities.  Long Term Goal 2: Patient will improve RUE AROM to [facility] for greater use of RUE as dominant with functional activities.  Long Term Goal 3: Patient will increase right wrist strength to 4+/5 for greater ability to pick up bags of groceries. Long Term Goal 4: Patient will improve right grip strength by 15 pounds and pinch strength by 4 pounds for greater ability to open water bottles. Long Term Goal 5: Patient will complete nine hole peg test with his right hand in 3 minutes or less for greater ability to fasten buttons. Additional Long Term Goals?: Yes Long Term Goal 6: Patient will improve light touch sensation from dulled to intact.   Problem List Patient Active Problem List   Diagnosis Date Noted  . Lack of coordination 09/18/2013  . Poor balance 09/11/2013  . Difficulty in walking(719.7) 09/11/2013  . Hx of falling 09/11/2013  . Encounter for driver's license history and physical 08/27/2013  . Routine general medical examination at a health care facility 02/06/2013  . CVA (cerebral vascular accident) 09/07/2012  . CKD (chronic kidney disease) stage 4, GFR 15-29 ml/min 09/07/2012  . Anemia 09/07/2012  . Cholelithiasis 08/10/2012  . Medically noncompliant 07/04/2012  . Shoulder pain, right 06/28/2012  . HTN (hypertension) 03/15/2012  . Bradycardia 03/15/2012  . Peripheral  autonomic neuropathy due to diabetes mellitus 03/14/2012  . Complex partial seizure disorder  03/07/2012  . Right hand weakness 02/04/2012  . Falls frequently 07/13/2011  . DM (diabetes mellitus) type II uncontrolled with renal manifestation 11/26/2010  . FATIGUE 01/01/2010  . UNSTEADY GAIT 11/20/2009  . NUMBNESS 11/20/2009  . HYPOTHYROIDISM 03/09/2008  . HYPERLIPIDEMIA 03/09/2008  . OBESITY 03/09/2008  . HYPERTENSION 03/09/2008  . PERIPHERAL VASCULAR DISEASE 03/09/2008  . DEGENERATIVE JOINT DISEASE, KNEE 01/26/2008  . DERANGEMENT MENISCUS 01/26/2008  . KNEE PAIN 01/26/2008  . SPONDYLOSIS 01/26/2008  . SPINAL STENOSIS 01/26/2008    End of Session Activity Tolerance: Patient tolerated treatment well General Behavior During Therapy: [facility] for tasks assessed/performed  GO    Vangie Bicker, OTR/L  10/18/2013, 4:11 PM

## 2013-10-20 ENCOUNTER — Inpatient Hospital Stay (HOSPITAL_COMMUNITY): Admission: RE | Admit: 2013-10-20 | Payer: Medicare Other | Source: Ambulatory Visit | Admitting: Physical Therapy

## 2013-10-20 ENCOUNTER — Ambulatory Visit (HOSPITAL_COMMUNITY)
Admission: RE | Admit: 2013-10-20 | Discharge: 2013-10-20 | Disposition: A | Discharge status: Home / Self Care | Payer: Medicare Other | Source: Ambulatory Visit | Attending: Family Medicine | Admitting: Family Medicine

## 2013-10-20 DIAGNOSIS — R262 Difficulty in walking, not elsewhere classified: Secondary | ICD-10-CM

## 2013-10-20 DIAGNOSIS — R2689 Other abnormalities of gait and mobility: Secondary | ICD-10-CM

## 2013-10-20 DIAGNOSIS — Z9181 History of falling: Secondary | ICD-10-CM

## 2013-10-20 NOTE — Progress Notes (Signed)
Physical Therapy Treatment Patient Details  Name: Chase [name] MRN: [id number] Date of Birth: [birth year]  Today's Date: 10/20/2013 Time: [id number] PT Time Calculation (min): 53 min Charge: NMR 1515-1550, TE [id number]  Visit#: 13 of 24  Re-eval: 11/17/13    Authorization: medicare  Authorization Time Period:    Authorization Visit#: 13 of 20   Subjective: Symptoms/Limitations Symptoms: Pt reported increased ease with sit to stand Pain Assessment Currently in Pain?: Yes Pain Score: 2  Pain Location: Shoulder Pain Orientation: Right  Objective:   Exercise/Treatments Balance Exercises Standing Retro Gait: 2 reps Sidestepping: 2 reps;Theraband Theraband Level (Sidestepping): Level 3 (Green) Cone Rotation: Foam;Right turn Other Standing Exercises: 5 STS no HHA and no assistance  Seated Other Seated Exercises: Nustep hill level 3 resistance 4 10' SPM goal 100 Other Seated Exercises: dynadisc anterior/posterior, R/L and rotation on disc 20x   Physical Therapy Assessment and Plan PT Assessment and Plan Clinical Impression Statement: Added dynadisc to improve hip mobility with improved pelvic rotation noted with gait.  Progressed to dynamic surface with standing trunk rotation exercise for balance training, required min assistance for LOB episodes. PT Plan: focusl on glut strength, improving trunk rotation while walking and balance    Goals Home Exercise Program Pt/caregiver will Perform Home Exercise Program: For increased strengthening PT Short Term Goals PT Short Term Goal 1: Berg improved by 5  PT Short Term Goal 2: Pt to be able to come sit to stand with two attempts I PT Short Term Goal 3: Pt to be able to walk for 10 minutes  PT Long Term Goals Time to Complete Long Term Goals: 4 weeks PT Long Term Goal 1: Pt Berg to be improved 10 points  PT Long Term Goal 1 - Progress: Progressing toward goal PT Long Term Goal 2: Pt to have had no falls  Long Term Goal 3: I in  advance HEP Long Term Goal 4: Pt to be walking for 20 minutes  PT Long Term Goal 5: Pt to have only 10degree of forward bend when standing Additional PT Long Term Goals?: Yes PT Long Term Goal 6: able to come sit to stand in one attempgt. Long Term Goal 6 Progress: Progressing toward goal  Problem List Patient Active Problem List   Diagnosis Date Noted  . Lack of coordination 09/18/2013  . Poor balance 09/11/2013  . Difficulty in walking(719.7) 09/11/2013  . Hx of falling 09/11/2013  . Encounter for driver's license history and physical 08/27/2013  . Routine general medical examination at a health care facility 02/06/2013  . CVA (cerebral vascular accident) 09/07/2012  . CKD (chronic kidney disease) stage 4, GFR 15-29 ml/min 09/07/2012  . Anemia 09/07/2012  . Cholelithiasis 08/10/2012  . Medically noncompliant 07/04/2012  . Shoulder pain, right 06/28/2012  . HTN (hypertension) 03/15/2012  . Bradycardia 03/15/2012  . Peripheral autonomic neuropathy due to diabetes mellitus 03/14/2012  . Complex partial seizure disorder 03/07/2012  . Right hand weakness 02/04/2012  . Falls frequently 07/13/2011  . DM (diabetes mellitus) type II uncontrolled with renal manifestation 11/26/2010  . FATIGUE 01/01/2010  . UNSTEADY GAIT 11/20/2009  . NUMBNESS 11/20/2009  . HYPOTHYROIDISM 03/09/2008  . HYPERLIPIDEMIA 03/09/2008  . OBESITY 03/09/2008  . HYPERTENSION 03/09/2008  . PERIPHERAL VASCULAR DISEASE 03/09/2008  . DEGENERATIVE JOINT DISEASE, KNEE 01/26/2008  . DERANGEMENT MENISCUS 01/26/2008  . KNEE PAIN 01/26/2008  . SPONDYLOSIS 01/26/2008  . SPINAL STENOSIS 01/26/2008    PT - End of Session Equipment Utilized During Treatment: Gait belt  Activity Tolerance: Patient tolerated treatment well General Behavior During Therapy: [facility] for tasks assessed/performed  GP    Aldona Lento 10/20/2013, 4:03 PM

## 2013-10-23 ENCOUNTER — Ambulatory Visit (HOSPITAL_COMMUNITY)
Admission: RE | Admit: 2013-10-23 | Discharge: 2013-10-23 | Disposition: A | Discharge status: Home / Self Care | Payer: Medicare Other | Source: Ambulatory Visit | Attending: Family Medicine | Admitting: Family Medicine

## 2013-10-23 NOTE — Progress Notes (Signed)
Physical Therapy Treatment Patient Details  Name: Chase [name] MRN: [id number] Date of Birth: [birth year]  Today's Date: 10/23/2013 Time: [id number] PT Time Calculation (min): 42 min Charges: NMR x [id number]) Therex x [id number])  Visit#: 14 of 24  Re-eval: 11/17/13  Authorization: medicare  Authorization Visit#: 14 of 20   Subjective: Symptoms/Limitations Symptoms: Pt states that his balance is improving. Pain Assessment Currently in Pain?: No/denies   Exercise/Treatments Balance Exercises Standing Standing Eyes Opened: Narrow base of support (BOS);Head turns Tandem Stance: 2 reps;30 secs Retro Gait: 2 reps Sidestepping: 2 reps;Theraband Theraband Level (Sidestepping): Level 3 (Green) Cone Rotation: Right turn;Left turn;Solid surface Other Standing Exercises: 5 STS no HHA and no assistance  Seated Other Seated Exercises: Nustep hill level 3 resistance 4 10' SPM goal 100   Physical Therapy Assessment and Plan PT Assessment and Plan Clinical Impression Statement: Pt continues to progress. Pt displays improved gait mechanics and posture. Pt continues to display decreased pelvic/trunk mobility with gait. Pt appears to be more motivated to complete exercises and improve functional mobility.  PT Plan: Focusl on glute strength, improving trunk rotation while walking and balance.     Problem List Patient Active Problem List   Diagnosis Date Noted  . Lack of coordination 09/18/2013  . Poor balance 09/11/2013  . Difficulty in walking(719.7) 09/11/2013  . Hx of falling 09/11/2013  . Encounter for driver's license history and physical 08/27/2013  . Routine general medical examination at a health care facility 02/06/2013  . CVA (cerebral vascular accident) 09/07/2012  . CKD (chronic kidney disease) stage 4, GFR 15-29 ml/min 09/07/2012  . Anemia 09/07/2012  . Cholelithiasis 08/10/2012  . Medically noncompliant 07/04/2012  . Shoulder pain, right 06/28/2012  . HTN  (hypertension) 03/15/2012  . Bradycardia 03/15/2012  . Peripheral autonomic neuropathy due to diabetes mellitus 03/14/2012  . Complex partial seizure disorder 03/07/2012  . Right hand weakness 02/04/2012  . Falls frequently 07/13/2011  . DM (diabetes mellitus) type II uncontrolled with renal manifestation 11/26/2010  . FATIGUE 01/01/2010  . UNSTEADY GAIT 11/20/2009  . NUMBNESS 11/20/2009  . HYPOTHYROIDISM 03/09/2008  . HYPERLIPIDEMIA 03/09/2008  . OBESITY 03/09/2008  . HYPERTENSION 03/09/2008  . PERIPHERAL VASCULAR DISEASE 03/09/2008  . DEGENERATIVE JOINT DISEASE, KNEE 01/26/2008  . DERANGEMENT MENISCUS 01/26/2008  . KNEE PAIN 01/26/2008  . SPONDYLOSIS 01/26/2008  . SPINAL STENOSIS 01/26/2008    PT - End of Session Equipment Utilized During Treatment: Gait belt Activity Tolerance: Patient tolerated treatment well General Behavior During Therapy: [facility] for tasks assessed/performed  Rachelle Hora, PTA  10/23/2013, 3:42 PM

## 2013-10-25 ENCOUNTER — Ambulatory Visit (HOSPITAL_COMMUNITY)
Admission: RE | Admit: 2013-10-25 | Discharge: 2013-10-25 | Disposition: A | Discharge status: Home / Self Care | Payer: Medicare Other | Source: Ambulatory Visit | Attending: Family Medicine | Admitting: Family Medicine

## 2013-10-25 NOTE — Progress Notes (Signed)
Physical Therapy Treatment Patient Details  Name: Chase [name] MRN: [id number] Date of Birth: 11/11/1931  Today's Date: 10/25/2013 Time: [id number] PT Time Calculation (min): 41 min Charges: NMR x [id number]) Therex x [id number])  Visit#: 15 of 24  Re-eval: 11/17/13  Authorization: medicare  Authorization Visit#: 15 of 20   Subjective: Symptoms/Limitations Symptoms: Pt states that the cold weather has caused his joints to be achy. Pain Assessment Currently in Pain?: No/denies Pain Score: 5  Pain Location: Shoulder Pain Orientation: Right Pain Type: Chronic pain Multiple Pain Sites: No  Precautions/Restrictions  Precautions Precautions: Fall Restrictions Weight Bearing Restrictions: No  Exercise/Treatments Balance Exercises Standing Standing Eyes Opened: Narrow base of support (BOS);Head turns Standing Eyes Closed: Narrow base of support (BOS) Tandem Stance: 2 reps;30 secs Sidestepping: 2 reps;Theraband Theraband Level (Sidestepping): Level 3 (Green) Marching: Solid surface;5 reps;Limitations Marching Limitations: Without UE assistance Other Standing Exercises: Hay bales x 10 bilateral Other Standing Exercises: Standing with back to wall facilitating errect posture; bilateral shoulder flexion x 10 standing at wall  Seated Other Seated Exercises: Nustep hill level 3 resistance 4 10' SPM goal 100   Physical Therapy Assessment and Plan PT Assessment and Plan Clinical Impression Statement: Pt continues to progress well. Pt is easily fatigued this session as he did not sleep well. Pt continues to require multimodal cueing to improve posture throughout session. Began activities to improve posture and trunk/pelvic mobility. PT Plan: Focus on glute strength, improving trunk rotation while walking and balance.    Problem List Patient Active Problem List   Diagnosis Date Noted  . Lack of coordination 09/18/2013  . Poor balance 09/11/2013  . Difficulty in  walking(719.7) 09/11/2013  . Hx of falling 09/11/2013  . Encounter for driver's license history and physical 08/27/2013  . Routine general medical examination at a health care facility 02/06/2013  . CVA (cerebral vascular accident) 09/07/2012  . CKD (chronic kidney disease) stage 4, GFR 15-29 ml/min 09/07/2012  . Anemia 09/07/2012  . Cholelithiasis 08/10/2012  . Medically noncompliant 07/04/2012  . Shoulder pain, right 06/28/2012  . HTN (hypertension) 03/15/2012  . Bradycardia 03/15/2012  . Peripheral autonomic neuropathy due to diabetes mellitus 03/14/2012  . Complex partial seizure disorder 03/07/2012  . Right hand weakness 02/04/2012  . Falls frequently 07/13/2011  . DM (diabetes mellitus) type II uncontrolled with renal manifestation 11/26/2010  . FATIGUE 01/01/2010  . UNSTEADY GAIT 11/20/2009  . NUMBNESS 11/20/2009  . HYPOTHYROIDISM 03/09/2008  . HYPERLIPIDEMIA 03/09/2008  . OBESITY 03/09/2008  . HYPERTENSION 03/09/2008  . PERIPHERAL VASCULAR DISEASE 03/09/2008  . DEGENERATIVE JOINT DISEASE, KNEE 01/26/2008  . DERANGEMENT MENISCUS 01/26/2008  . KNEE PAIN 01/26/2008  . SPONDYLOSIS 01/26/2008  . SPINAL STENOSIS 01/26/2008    PT - End of Session Equipment Utilized During Treatment: Gait belt Activity Tolerance: Patient tolerated treatment well General Behavior During Therapy: [facility] for tasks assessed/performed  Rachelle Hora, PTA 10/25/2013, 3:56 PM

## 2013-10-27 ENCOUNTER — Ambulatory Visit (HOSPITAL_COMMUNITY): Payer: Medicare Other | Admitting: Physical Therapy

## 2013-10-30 ENCOUNTER — Ambulatory Visit (HOSPITAL_COMMUNITY)
Admission: RE | Admit: 2013-10-30 | Discharge: 2013-10-30 | Disposition: A | Discharge status: Home / Self Care | Payer: Medicare Other | Source: Ambulatory Visit | Attending: Family Medicine | Admitting: Family Medicine

## 2013-10-30 NOTE — Progress Notes (Signed)
Physical Therapy Treatment Patient Details  Name: Chase [name] MRN: [id number] Date of Birth: [birth year]  Today's Date: 10/30/2013 Time: [id number] PT Time Calculation (min): 43 min Charges: NMR x [id number]) Therex x [id number])  Visit#: 16 of 24  Re-eval: 11/17/13  Authorization: medicare  Authorization Visit#: 16 of 20   Subjective: Symptoms/Limitations Symptoms: Pt states that he has been completing his exercises at home. Pain Assessment Currently in Pain?: Yes Pain Score: 5  Pain Location: Shoulder Pain Orientation: Right   Exercise/Treatments Supine Bridges: 10 reps Straight Leg Raises: 5 reps;Both Knee Extension: PROM;Both Other Supine Knee Exercises: Lower trunk rotation 5 x 10" Sidelying Hip ABduction: 10 reps;Both Other Sidelying Knee Exercises: Hip extension x 10 bilateral  Balance Exercises Standing Heel Raises: 15 reps Other Standing Exercises: Hay bales x 10 bilateral; Standing trunk rotation x 5 each direction Other Standing Exercises: Standing with back to wall facilitating errect posture; bilateral shoulder flexion x 10 standing at wall with towel behind head  Seated Other Seated Exercises: Nustep hill level 3 resistance 4 10' SPM goal 100 to improve strength and activity tolerance   Physical Therapy Assessment and Plan PT Assessment and Plan Clinical Impression Statement: Pt continues to progress well. Began lower trunk rotation in supine and active standing trunk rotation to improve trunk/pelvic mobility. Pt requires vc's to increases heel strike and avoid ER of bilateral hips when walking. Pt displays improved posture this session. PT Plan: Focus on glute strength, improving trunk rotation while walking and balance.     Problem List Patient Active Problem List   Diagnosis Date Noted  . Lack of coordination 09/18/2013  . Poor balance 09/11/2013  . Difficulty in walking(719.7) 09/11/2013  . Hx of falling 09/11/2013  . Encounter for  driver's license history and physical 08/27/2013  . Routine general medical examination at a health care facility 02/06/2013  . CVA (cerebral vascular accident) 09/07/2012  . CKD (chronic kidney disease) stage 4, GFR 15-29 ml/min 09/07/2012  . Anemia 09/07/2012  . Cholelithiasis 08/10/2012  . Medically noncompliant 07/04/2012  . Shoulder pain, right 06/28/2012  . HTN (hypertension) 03/15/2012  . Bradycardia 03/15/2012  . Peripheral autonomic neuropathy due to diabetes mellitus 03/14/2012  . Complex partial seizure disorder 03/07/2012  . Right hand weakness 02/04/2012  . Falls frequently 07/13/2011  . DM (diabetes mellitus) type II uncontrolled with renal manifestation 11/26/2010  . FATIGUE 01/01/2010  . UNSTEADY GAIT 11/20/2009  . NUMBNESS 11/20/2009  . HYPOTHYROIDISM 03/09/2008  . HYPERLIPIDEMIA 03/09/2008  . OBESITY 03/09/2008  . HYPERTENSION 03/09/2008  . PERIPHERAL VASCULAR DISEASE 03/09/2008  . DEGENERATIVE JOINT DISEASE, KNEE 01/26/2008  . DERANGEMENT MENISCUS 01/26/2008  . KNEE PAIN 01/26/2008  . SPONDYLOSIS 01/26/2008  . SPINAL STENOSIS 01/26/2008    PT - End of Session Equipment Utilized During Treatment: Gait belt Activity Tolerance: Patient tolerated treatment well General Behavior During Therapy: [facility] for tasks assessed/performed  Rachelle Hora, PTA  10/30/2013, 4:56 PM

## 2013-11-01 ENCOUNTER — Ambulatory Visit (HOSPITAL_COMMUNITY)
Admission: RE | Admit: 2013-11-01 | Discharge: 2013-11-01 | Disposition: A | Discharge status: Home / Self Care | Payer: Medicare Other | Source: Ambulatory Visit | Attending: Family Medicine | Admitting: Family Medicine

## 2013-11-01 DIAGNOSIS — R279 Unspecified lack of coordination: Secondary | ICD-10-CM

## 2013-11-01 NOTE — Progress Notes (Signed)
Occupational Therapy Treatment Patient Details  Name: Chase [name] MRN: [id number] Date of Birth: 02/26/1931  Today's Date: 11/01/2013 Time: [id number] OT Time Calculation (min): 49 min Neuro reeducation [id number] 66' Visit#: 6 of 12  Re-eval: 11/15/13    Authorization: Medicare  Authorization Time Period:    Authorization Visit#: 6 of 10  Subjective Pain Assessment Currently in Pain?: Yes Pain Score: 3  Pain Location: Shoulder Pain Orientation: Right  Precautions/Restrictions     Exercise/Treatments Seated Other Seated Exercises: pinch tree placed 15 pins with right hand on vertical stick.  and then removed, more challenging to reach vs pinch ROM / Strengthening / Isometric Strengthening UBE (Upper Arm Bike): 3' and 3' 1.0   Hand Exercises Theraputty: Roll;Grip;Pinch Theraputty - Roll: red Theraputty - Grip: red Theraputty - Pinch: red Sponges: picking up one by one and placing in hand for grip strengthening 4, 6, 7 Other Hand Exercises: used purple clothespin to pick up 15 max resist sponges  - unable to complete more than 3 individually.        Occupational Therapy Assessment and Plan OT Assessment and Plan Clinical Impression Statement: A:  Added UBE, pinch tree, and sponges for hand strengthening.  Patient felt all exercises were quite challenging.  OT Plan: P: Improve to 17 clothespins on vertical stick.   Goals Short Term Goals Short Term Goal 1: Patient will be educated on HEP. Short Term Goal 2: Patient will improve RUE AROM by 10 degrees for greater use of RUE as dominant with functional activities.  Short Term Goal 3: Patient will increase right wrist strength to 4-/5 for greater ability to pick up bags of groceries. Short Term Goal 4: Patient will improve right grip strength by 10 pounds and pinch strength by 2 pounds for greater ability to open jars. Short Term Goal 5: Patient will complete nine hole peg test with his right hand without assistance  from OTR/L Long Term Goals Long Term Goal 1: Patient will use his right arm as dominant with daily activities.  Long Term Goal 2: Patient will improve RUE AROM to [facility] for greater use of RUE as dominant with functional activities.  Long Term Goal 3: Patient will increase right wrist strength to 4+/5 for greater ability to pick up bags of groceries. Long Term Goal 4: Patient will improve right grip strength by 15 pounds and pinch strength by 4 pounds for greater ability to open water bottles. Long Term Goal 5: Patient will complete nine hole peg test with his right hand in 3 minutes or less for greater ability to fasten buttons. Long Term Goal 6: Patient will improve light touch sensation from dulled to intact.   Problem List Patient Active Problem List   Diagnosis Date Noted  . Lack of coordination 09/18/2013  . Poor balance 09/11/2013  . Difficulty in walking(719.7) 09/11/2013  . Hx of falling 09/11/2013  . Encounter for driver's license history and physical 08/27/2013  . Routine general medical examination at a health care facility 02/06/2013  . CVA (cerebral vascular accident) 09/07/2012  . CKD (chronic kidney disease) stage 4, GFR 15-29 ml/min 09/07/2012  . Anemia 09/07/2012  . Cholelithiasis 08/10/2012  . Medically noncompliant 07/04/2012  . Shoulder pain, right 06/28/2012  . HTN (hypertension) 03/15/2012  . Bradycardia 03/15/2012  . Peripheral autonomic neuropathy due to diabetes mellitus 03/14/2012  . Complex partial seizure disorder 03/07/2012  . Right hand weakness 02/04/2012  . Falls frequently 07/13/2011  . DM (diabetes mellitus) type II uncontrolled with  renal manifestation 11/26/2010  . FATIGUE 01/01/2010  . UNSTEADY GAIT 11/20/2009  . NUMBNESS 11/20/2009  . HYPOTHYROIDISM 03/09/2008  . HYPERLIPIDEMIA 03/09/2008  . OBESITY 03/09/2008  . HYPERTENSION 03/09/2008  . PERIPHERAL VASCULAR DISEASE 03/09/2008  . DEGENERATIVE JOINT DISEASE, KNEE 01/26/2008  . DERANGEMENT  MENISCUS 01/26/2008  . KNEE PAIN 01/26/2008  . SPONDYLOSIS 01/26/2008  . SPINAL STENOSIS 01/26/2008    End of Session Activity Tolerance: Patient tolerated treatment well General Behavior During Therapy: [facility] for tasks assessed/performed  GO    Vangie Bicker, OTR/L  11/01/2013, 4:17 PM

## 2013-11-01 NOTE — Evaluation (Signed)
Occupational TherapyRe- Evaluation  Patient Details  Name: Chase [name] MRN: [id number] Date of Birth: 1931-07-19  Today's Date: 10/25/2013 Time: [id number] OT Time Calculation (min): 50 min Reassess [id number] (33') TherExercises [id number] (75')  Visit#: 4 of 12  Re-eval: 11/15/13  Assessment Diagnosis: Right Arm Pain  Next MD Visit: unknown Prior Therapy: currently receiving PT for balance deficits  Authorization: Medicare  Authorization Time Period: before 10th visit  Authorization Visit#: 4 of 10   Past Medical History:  Past Medical History  Diagnosis Date  . Peripheral vascular disease, unspecified   . Depressive disorder, not elsewhere classified   . Obesity   . Other and unspecified hyperlipidemia   . Pain in joint, lower leg   . Osteoarthrosis, unspecified whether generalized or localized, lower leg   . Derangement of meniscus, not elsewhere classified   . Lumbago   . Spondylosis of unspecified site without mention of myelopathy   . Spinal stenosis, unspecified region other than cervical   . Backache, unspecified   . Family history of diabetes mellitus   . Seizures   . Complete lesion of cervical spinal cord 03/14/3012    Stable since 2006  . Lacunar stroke, acute 03/14/2012  . Bradycardia 03/15/2012  . Gait disorder   . Knee contracture   . Diabetic neuropathy   . CKD (chronic kidney disease) stage 3, GFR 30-59 ml/min   . CKD (chronic kidney disease) stage 4, GFR 15-29 ml/min   . Diabetes mellitus approx 1994  . Hypothyroidism approx 2000  . Unspecified hypothyroidism   . Hypertension approx 1994  . Unspecified essential hypertension   . CVA (cerebrovascular accident) 09/07/12   Past Surgical History:  Past Surgical History  Procedure Laterality Date  . Kidney stones left x2  1975  . Kidney surgery      Ruptured left kidney 30 yrs ago  from a kidney stone  . Colonoscopy N/A 08/10/2013    Procedure: COLONOSCOPY;  Surgeon: Rogene Houston, MD;  Location: [facility];  Service: Endoscopy;  Laterality: N/A;  240    Subjective Symptoms/Limitations Symptoms: S:  The cold weather dont do me no good.... arthritis is popping.  Pain Assessment Currently in Pain?: Yes Pain Score: 5  Pain Location: Shoulder Pain Orientation: Right Pain Type: Chronic pain Multiple Pain Sites: No  Precautions/Restrictions  Precautions Precautions: Fall Restrictions Weight Bearing Restrictions: No  Balance Screening Balance Screen Has the patient fallen in the past 6 months: Yes How many times?: 1 (patient states he was reaching for the door handle & missed) Has the patient had a decrease in activity level because of a fear of falling? : No Is the patient reluctant to leave their home because of a fear of falling? : No  Assessment ADL/Vision/Perception ADL ADL Comments: Patietn reports decreased dropping of objects however continues to drop some things and reports his hand feels 'slick'  Dominant Hand: Right  Cognition/Observation Cognition Overall Cognitive Status: Within Functional Limits for tasks assessed  Sensation/Coordination/Edema Sensation Light Touch: Impaired by gross assessment (reports decreased numbness in RUE ) Coordination 9 Hole Peg Test: Right:  3'16.88" left: 49.40" (right: placed 5 independently in 2 minutes, then OTR/L held pegs upright and allowed patient to pick up, then removed independently in a total of 4'16" left 48.84"  Additional Assessments RUE AROM (degrees) RUE Overall AROM Comments: assessed in seated Right Shoulder Flexion: 118 Degrees (110) Right Shoulder ABduction: 105 Degrees (94) Right Shoulder Internal Rotation: 90 Degrees (94) Right Shoulder External  Rotation: 36 Degrees (90) Right Elbow Flexion: 129 (124) Right Elbow Extension: -28 (-33) Right Forearm Pronation: 76 Degrees (70) Right Forearm Supination: 67 Degrees (50) Right Wrist Extension: 48 Degrees (48) Right Wrist Flexion: 44 Degrees (40) RUE  Strength Grip (lbs): 34 (20 ) Lateral Pinch: 10 lbs (6) 3 Point Pinch: 7 lbs (4) Palpation Palpation: arthritic joints Right Hand Strength - Pinch (lbs) Lateral Pinch: 10 lbs (6) 3 Point Pinch: 7 lbs (4)   Exercise/Treatments   Pulleys Flexion: 2 minutes ABduction: 2 minutes   Elbow Exercises Elbow Flexion: PROM;AROM;10 reps Elbow Extension: PROM;AROM;10 reps Forearm Supination: PROM;AROM;10 reps Forearm Pronation: PROM;AROM;10 reps Wrist Flexion: PROM;AROM;10 reps Wrist Extension: PROM;AROM;10 reps   Theraputty: Flatten;Roll Theraputty - Flatten: red Theraputty - Roll: red        Occupational Therapy Assessment and Plan OT Assessment and Plan Clinical Impression Statement: A: Reassessment this date in which patient demos improved range of motion, incrased strength and reports increased functional use of UE.  Patient met 3/5 STGs OT Plan: P: Theraputty for hand mobilty and upper body strengthening.    Goals Short Term Goals Short Term Goal 1: Patient will be educated on HEP. Short Term Goal 1 Progress: Met Short Term Goal 2: Patient will improve RUE AROM by 10 degrees for greater use of RUE as dominant with functional activities.  (11/19 - improved by 8 degrees) Short Term Goal 2 Progress: Progressing toward goal Short Term Goal 3: Patient will increase right wrist strength to 4-/5 for greater ability to pick up bags of groceries. Short Term Goal 3 Progress: Progressing toward goal Short Term Goal 4: Patient will improve right grip strength by 10 pounds and pinch strength by 2 pounds for greater ability to open jars. Short Term Goal 4 Progress: Met Short Term Goal 5: Patient will complete nine hole peg test with his right hand without assistance from OTR/L Short Term Goal 5 Progress: Met Long Term Goals Long Term Goal 1: Patient will use his right arm as dominant with daily activities.  Long Term Goal 1 Progress: Progressing toward goal Long Term Goal 2: Patient  will improve RUE AROM to [facility] for greater use of RUE as dominant with functional activities.  Long Term Goal 2 Progress: Progressing toward goal Long Term Goal 3: Patient will increase right wrist strength to 4+/5 for greater ability to pick up bags of groceries. Long Term Goal 3 Progress: Progressing toward goal Long Term Goal 4: Patient will improve right grip strength by 15 pounds and pinch strength by 4 pounds for greater ability to open water bottles. Long Term Goal 4 Progress: Progressing toward goal Long Term Goal 5: Patient will complete nine hole peg test with his right hand in 3 minutes or less for greater ability to fasten buttons. Long Term Goal 5 Progress: Progressing toward goal Long Term Goal 6: Patient will improve light touch sensation from dulled to intact.  Long Term Goal 6 Progress: Progressing toward goal  Problem List Patient Active Problem List   Diagnosis Date Noted  . Lack of coordination 09/18/2013  . Poor balance 09/11/2013  . Difficulty in walking(719.7) 09/11/2013  . Hx of falling 09/11/2013  . Encounter for driver's license history and physical 08/27/2013  . Routine general medical examination at a health care facility 02/06/2013  . CVA (cerebral vascular accident) 09/07/2012  . CKD (chronic kidney disease) stage 4, GFR 15-29 ml/min 09/07/2012  . Anemia 09/07/2012  . Cholelithiasis 08/10/2012  . Medically noncompliant 07/04/2012  .  Shoulder pain, right 06/28/2012  . HTN (hypertension) 03/15/2012  . Bradycardia 03/15/2012  . Peripheral autonomic neuropathy due to diabetes mellitus 03/14/2012  . Complex partial seizure disorder 03/07/2012  . Right hand weakness 02/04/2012  . Falls frequently 07/13/2011  . DM (diabetes mellitus) type II uncontrolled with renal manifestation 11/26/2010  . FATIGUE 01/01/2010  . UNSTEADY GAIT 11/20/2009  . NUMBNESS 11/20/2009  . HYPOTHYROIDISM 03/09/2008  . HYPERLIPIDEMIA 03/09/2008  . OBESITY 03/09/2008  . HYPERTENSION  03/09/2008  . PERIPHERAL VASCULAR DISEASE 03/09/2008  . DEGENERATIVE JOINT DISEASE, KNEE 01/26/2008  . DERANGEMENT MENISCUS 01/26/2008  . KNEE PAIN 01/26/2008  . SPONDYLOSIS 01/26/2008  . SPINAL STENOSIS 01/26/2008    End of Session Activity Tolerance: Patient tolerated treatment well General Behavior During Therapy: [facility] for tasks assessed/performed  GO    Gjon Letarte,OTR/L 10/25/2013, 4:09 PM  Physician Documentation Your signature is required to indicate approval of the treatment plan as stated above.  Please sign and either send electronically or make a copy of this report for your files and return this physician signed original.  Please mark one 1.__approve of plan  2. ___approve of plan with the following conditions.   ______________________________                                                          _____________________ Physician Signature                                                                                                             Date

## 2013-11-01 NOTE — Progress Notes (Signed)
Physical Therapy Treatment Patient Details  Name: Chase [name] MRN: [id number] Date of Birth: 1931-02-15  Today's Date: 11/01/2013 Time: 1530-1610 PT Time Calculation (min): 40 min Charges: NMR x 30'  Visit#: 17 of 24  Re-eval: 11/17/13  Authorization: medicare  Authorization Visit#: 17 of 20   Subjective: Symptoms/Limitations Symptoms: Pt states that it is easier for him to get out of bed. Pain Assessment Currently in Pain?: Yes Pain Score: 3  Pain Location: Shoulder Pain Orientation: Right   Exercise/Treatments  Standing Wall Bumps: Hips;10 reps Heel Raises: 15 reps Toe Raise: 15 reps Other Standing Exercises: Hay bales x 10 bilateral; Standing trunk rotation x 5 each direction Other Standing Exercises: Standing with back to wall facilitating errect posture; bilateral shoulder flexion x 10 standing at wall with towel behind head  Seated Other Seated Exercises: Nustep hill level 3 resistance 4 10' SPM goal 100 to improve strength and activity tolerance  Other Seated Exercises: sit to stand x 5  Physical Therapy Assessment and Plan PT Assessment and Plan Clinical Impression Statement: Pt continues to displays improved activity tolerance. Posture is gradually improving. Began wall bumps to improve proprioceptive awareness/control. Pt requires multimodal cueing with sit to stand to improve form.  PT Plan: Focus on glute strength, improving trunk rotation while walking and balance.     Problem List Patient Active Problem List   Diagnosis Date Noted  . Lack of coordination 09/18/2013  . Poor balance 09/11/2013  . Difficulty in walking(719.7) 09/11/2013  . Hx of falling 09/11/2013  . Encounter for driver's license history and physical 08/27/2013  . Routine general medical examination at a health care facility 02/06/2013  . CVA (cerebral vascular accident) 09/07/2012  . CKD (chronic kidney disease) stage 4, GFR 15-29 ml/min 09/07/2012  . Anemia 09/07/2012  .  Cholelithiasis 08/10/2012  . Medically noncompliant 07/04/2012  . Shoulder pain, right 06/28/2012  . HTN (hypertension) 03/15/2012  . Bradycardia 03/15/2012  . Peripheral autonomic neuropathy due to diabetes mellitus 03/14/2012  . Complex partial seizure disorder 03/07/2012  . Right hand weakness 02/04/2012  . Falls frequently 07/13/2011  . DM (diabetes mellitus) type II uncontrolled with renal manifestation 11/26/2010  . FATIGUE 01/01/2010  . UNSTEADY GAIT 11/20/2009  . NUMBNESS 11/20/2009  . HYPOTHYROIDISM 03/09/2008  . HYPERLIPIDEMIA 03/09/2008  . OBESITY 03/09/2008  . HYPERTENSION 03/09/2008  . PERIPHERAL VASCULAR DISEASE 03/09/2008  . DEGENERATIVE JOINT DISEASE, KNEE 01/26/2008  . DERANGEMENT MENISCUS 01/26/2008  . KNEE PAIN 01/26/2008  . SPONDYLOSIS 01/26/2008  . SPINAL STENOSIS 01/26/2008    PT - End of Session Equipment Utilized During Treatment: Gait belt Activity Tolerance: Patient tolerated treatment well General Behavior During Therapy: [facility] for tasks assessed/performed  Rachelle Hora, PTA 11/01/2013, 5:46 PM

## 2013-11-01 NOTE — Progress Notes (Signed)
Occupational Therapy Treatment Patient Details  Name: Chase [name] MRN: [id number] Date of Birth: February 01, 1931  Today's Date: 10/30/2013 Time: 1350-1430 OT Time Calculation (min): 40 min TherAcitvities1350-1405 (15') TherExercise 1405-1430 (25')  Visit#: 5 of 12  Re-eval: 11/15/13 Assessment Diagnosis: Right Arm Pain  Next MD Visit: unknown Prior Therapy: currently receiving PT for balance deficits  Authorization: Medicare  Authorization Time Period: before 10th visit  Authorization Visit#: 5 of 10  Subjective Symptoms/Limitations Symptoms: S:  I feel like I am doing pretty good but my arm still hurts.  Pain Assessment Currently in Pain?: Yes Pain Score: 4  Pain Location: Shoulder Pain Orientation: Right Pain Type: Chronic pain Multiple Pain Sites: No  Precautions/Restrictions  Precautions Precautions: Fall Restrictions Weight Bearing Restrictions: No  Exercise/Treatments Seated Elevation: AAROM;15 reps Retraction: AAROM;15 reps External Rotation: PROM;AAROM;10 reps Internal Rotation: PROM;AAROM;10 reps Flexion: PROM;AAROM;10 reps Abduction: PROM;AAROM;10 reps Standing Other Standing Exercises: finger hole walk x3 reps with increased time (unable to attain last 3 holes on wall) Pulleys Flexion: 2 minutes ABduction: 2 minutes   Hand Exercises Theraputty: Flatten (press with 3/4 dowel ) Theraputty - Flatten: red Theraputty - Roll: red Sponges: red clothes pin to attain and replace 15 sponges with min difficulty and increased time.         Occupational Therapy Assessment and Plan OT Assessment and Plan Clinical Impression Statement: A:  Patient with improving range for wall hole ladder this date.  initiated theraputty including press for RUE strengthening with good tolerance and complaint of fatigue following  OT Plan: P: Theraputty for hand mobilty and upper body strengthening.    Goals Short Term Goals Short Term Goal 1: Patient will be educated on  HEP. Short Term Goal 1 Progress: Met Short Term Goal 2: Patient will improve RUE AROM by 10 degrees for greater use of RUE as dominant with functional activities.  Short Term Goal 2 Progress: Progressing toward goal Short Term Goal 3: Patient will increase right wrist strength to 4-/5 for greater ability to pick up bags of groceries. Short Term Goal 3 Progress: Progressing toward goal Short Term Goal 4: Patient will improve right grip strength by 10 pounds and pinch strength by 2 pounds for greater ability to open jars. Short Term Goal 4 Progress: Met Short Term Goal 5: Patient will complete nine hole peg test with his right hand without assistance from OTR/L Short Term Goal 5 Progress: Met Long Term Goals Long Term Goal 1: Patient will use his right arm as dominant with daily activities.  Long Term Goal 1 Progress: Progressing toward goal Long Term Goal 2: Patient will improve RUE AROM to [facility] for greater use of RUE as dominant with functional activities.  Long Term Goal 2 Progress: Progressing toward goal Long Term Goal 3: Patient will increase right wrist strength to 4+/5 for greater ability to pick up bags of groceries. Long Term Goal 3 Progress: Progressing toward goal Long Term Goal 4: Patient will improve right grip strength by 15 pounds and pinch strength by 4 pounds for greater ability to open water bottles. Long Term Goal 4 Progress: Progressing toward goal Long Term Goal 5: Patient will complete nine hole peg test with his right hand in 3 minutes or less for greater ability to fasten buttons. Long Term Goal 5 Progress: Met Long Term Goal 6: Patient will improve light touch sensation from dulled to intact.  Long Term Goal 6 Progress: Progressing toward goal  Problem List Patient Active Problem List   Diagnosis  Date Noted  . Lack of coordination 09/18/2013  . Poor balance 09/11/2013  . Difficulty in walking(719.7) 09/11/2013  . Hx of falling 09/11/2013  . Encounter for driver's  license history and physical 08/27/2013  . Routine general medical examination at a health care facility 02/06/2013  . CVA (cerebral vascular accident) 09/07/2012  . CKD (chronic kidney disease) stage 4, GFR 15-29 ml/min 09/07/2012  . Anemia 09/07/2012  . Cholelithiasis 08/10/2012  . Medically noncompliant 07/04/2012  . Shoulder pain, right 06/28/2012  . HTN (hypertension) 03/15/2012  . Bradycardia 03/15/2012  . Peripheral autonomic neuropathy due to diabetes mellitus 03/14/2012  . Complex partial seizure disorder 03/07/2012  . Right hand weakness 02/04/2012  . Falls frequently 07/13/2011  . DM (diabetes mellitus) type II uncontrolled with renal manifestation 11/26/2010  . FATIGUE 01/01/2010  . UNSTEADY GAIT 11/20/2009  . NUMBNESS 11/20/2009  . HYPOTHYROIDISM 03/09/2008  . HYPERLIPIDEMIA 03/09/2008  . OBESITY 03/09/2008  . HYPERTENSION 03/09/2008  . PERIPHERAL VASCULAR DISEASE 03/09/2008  . DEGENERATIVE JOINT DISEASE, KNEE 01/26/2008  . DERANGEMENT MENISCUS 01/26/2008  . KNEE PAIN 01/26/2008  . SPONDYLOSIS 01/26/2008  . SPINAL STENOSIS 01/26/2008    End of Session Activity Tolerance: Patient tolerated treatment well General Behavior During Therapy: [facility] for tasks assessed/performed  GO    Donney Rankins, OTR/L 10/30/2013, 3:28 PM

## 2013-11-06 ENCOUNTER — Ambulatory Visit (HOSPITAL_COMMUNITY)
Admission: RE | Admit: 2013-11-06 | Discharge: 2013-11-06 | Disposition: A | Discharge status: Home / Self Care | Payer: Medicare Other | Source: Ambulatory Visit | Attending: Family Medicine | Admitting: Family Medicine

## 2013-11-06 DIAGNOSIS — M25569 Pain in unspecified knee: Secondary | ICD-10-CM | POA: Diagnosis not present

## 2013-11-06 DIAGNOSIS — R279 Unspecified lack of coordination: Secondary | ICD-10-CM

## 2013-11-06 DIAGNOSIS — I10 Essential (primary) hypertension: Secondary | ICD-10-CM | POA: Insufficient documentation

## 2013-11-06 DIAGNOSIS — IMO0001 Reserved for inherently not codable concepts without codable children: Secondary | ICD-10-CM | POA: Diagnosis not present

## 2013-11-06 DIAGNOSIS — E1142 Type 2 diabetes mellitus with diabetic polyneuropathy: Secondary | ICD-10-CM | POA: Insufficient documentation

## 2013-11-06 DIAGNOSIS — R269 Unspecified abnormalities of gait and mobility: Secondary | ICD-10-CM | POA: Diagnosis not present

## 2013-11-06 DIAGNOSIS — E1149 Type 2 diabetes mellitus with other diabetic neurological complication: Secondary | ICD-10-CM | POA: Insufficient documentation

## 2013-11-06 NOTE — Progress Notes (Signed)
Occupational Therapy Treatment Patient Details  Name: Chase [name] MRN: [id number] Date of Birth: 1931-07-11  Today's Date: 11/06/2013 Time: [id number] OT Time Calculation (min): 45 min Therapeutic exercises 45' Visit#: 7 of 12  Re-eval: 11/15/13    Authorization: Medicare  Authorization Time Period: before 10th visit  Authorization Visit#: 7 of 10  Subjective Symptoms/Limitations Symptoms: S:  I like doing either one of those bikes, they work my arm good!  Precautions/Restrictions     Exercise/Treatments Seated Other Seated Exercises: pinch tree placed 19 pins with right hand on vertical stick.  and then removed, more challenging to reach vs pinch ROM / Strengthening / Isometric Strengthening Other ROM/Strengthening Exercises: Nustep 10' with arms positioned on 12 X 10'   Hand Exercises Sponges: picking up one by one and translating to palm to build grip strength 8, 9, 8 and then picked up piles of 8 at atime and dropped back into bucket Small Pegboard: attempted to place and remove small pegs, could not due to increased difficulty feeling the pegs        Occupational Therapy Assessment and Plan OT Assessment and Plan Clinical Impression Statement: A:  difficulty feeling the pegs with small pegboard. OT Plan: P:  Pick up 11 sponges at one time.    Goals Short Term Goals Short Term Goal 1: Patient will be educated on HEP. Short Term Goal 2: Patient will improve RUE AROM by 10 degrees for greater use of RUE as dominant with functional activities.  Short Term Goal 2 Progress: Progressing toward goal Short Term Goal 3: Patient will increase right wrist strength to 4-/5 for greater ability to pick up bags of groceries. Short Term Goal 3 Progress: Progressing toward goal Short Term Goal 4: Patient will improve right grip strength by 10 pounds and pinch strength by 2 pounds for greater ability to open jars. Short Term Goal 4 Progress: Met Short Term Goal 5: Patient will  complete nine hole peg test with his right hand without assistance from OTR/L Short Term Goal 5 Progress: Met Long Term Goals Long Term Goal 1: Patient will use his right arm as dominant with daily activities.  Long Term Goal 1 Progress: Progressing toward goal Long Term Goal 2: Patient will improve RUE AROM to [facility] for greater use of RUE as dominant with functional activities.  Long Term Goal 2 Progress: Progressing toward goal Long Term Goal 3: Patient will increase right wrist strength to 4+/5 for greater ability to pick up bags of groceries. Long Term Goal 3 Progress: Progressing toward goal Long Term Goal 4: Patient will improve right grip strength by 15 pounds and pinch strength by 4 pounds for greater ability to open water bottles. Long Term Goal 4 Progress: Progressing toward goal Long Term Goal 5: Patient will complete nine hole peg test with his right hand in 3 minutes or less for greater ability to fasten buttons. Long Term Goal 5 Progress: Met Long Term Goal 6: Patient will improve light touch sensation from dulled to intact.  Long Term Goal 6 Progress: Progressing toward goal  Problem List Patient Active Problem List   Diagnosis Date Noted  . Lack of coordination 09/18/2013  . Poor balance 09/11/2013  . Difficulty in walking(719.7) 09/11/2013  . Hx of falling 09/11/2013  . Encounter for driver's license history and physical 08/27/2013  . Routine general medical examination at a health care facility 02/06/2013  . CVA (cerebral vascular accident) 09/07/2012  . CKD (chronic kidney disease) stage 4, GFR 15-29  ml/min 09/07/2012  . Anemia 09/07/2012  . Cholelithiasis 08/10/2012  . Medically noncompliant 07/04/2012  . Shoulder pain, right 06/28/2012  . HTN (hypertension) 03/15/2012  . Bradycardia 03/15/2012  . Peripheral autonomic neuropathy due to diabetes mellitus 03/14/2012  . Complex partial seizure disorder 03/07/2012  . Right hand weakness 02/04/2012  . Falls frequently  07/13/2011  . DM (diabetes mellitus) type II uncontrolled with renal manifestation 11/26/2010  . FATIGUE 01/01/2010  . UNSTEADY GAIT 11/20/2009  . NUMBNESS 11/20/2009  . HYPOTHYROIDISM 03/09/2008  . HYPERLIPIDEMIA 03/09/2008  . OBESITY 03/09/2008  . HYPERTENSION 03/09/2008  . PERIPHERAL VASCULAR DISEASE 03/09/2008  . DEGENERATIVE JOINT DISEASE, KNEE 01/26/2008  . DERANGEMENT MENISCUS 01/26/2008  . KNEE PAIN 01/26/2008  . SPONDYLOSIS 01/26/2008  . SPINAL STENOSIS 01/26/2008       [place], OTR/L  11/06/2013, 3:27 PM

## 2013-11-08 ENCOUNTER — Ambulatory Visit (HOSPITAL_COMMUNITY)
Admission: RE | Admit: 2013-11-08 | Discharge: 2013-11-08 | Disposition: A | Discharge status: Home / Self Care | Payer: Medicare Other | Source: Ambulatory Visit | Attending: Family Medicine | Admitting: Family Medicine

## 2013-11-08 NOTE — Progress Notes (Signed)
Occupational Therapy Treatment Patient Details  Name: Chase [name] MRN: [id number] Date of Birth: 05-13-1931  Today's Date: 11/08/2013 Time: [id number] OT Time Calculation (min): 38 min TherExercises [id number] (37')   Visit#: 8 of 12  Re-eval: 11/15/13    Authorization: Medicare  Authorization Time Period: before 10th visit  Authorization Visit#: 8 of 10  Subjective Symptoms/Limitations Symptoms: S:  I just hurt all night last night... I dont know what was going on  Pain Assessment Pain Score: 5  Pain Location: Generalized Multiple Pain Sites: No   Exercise/Treatments Standing Other Standing Exercises: finger hole walk x3 reps with increased time (unalbe to make holes 11 and 12 ) Pulleys Flexion: 3 minutes ABduction: 3 minutes  Hand Exercises Theraputty: Flatten;Roll;Grip (putty press with 3/4" dowel x  ) Theraputty - Flatten: red Theraputty - Roll: red Theraputty - Grip: red Other Hand Exercises: utilized red clothes pin to attain 15 max resistance sponges and place in container at shoulder height  with increased time.         Occupational Therapy Assessment and Plan OT Assessment and Plan Clinical Impression Statement: A: Improved shoulder flexion this date wtih decreased complaint of pain/tightness.  Patient with complaint of pain 2/10 following treatment.   OT Plan: P:  Pick up 11 sponges at one time.    Goals Short Term Goals Short Term Goal 1: Patient will be educated on HEP. Short Term Goal 1 Progress: Met Short Term Goal 2: Patient will improve RUE AROM by 10 degrees for greater use of RUE as dominant with functional activities.  Short Term Goal 2 Progress: Progressing toward goal Short Term Goal 3: Patient will increase right wrist strength to 4-/5 for greater ability to pick up bags of groceries. Short Term Goal 3 Progress: Progressing toward goal Short Term Goal 4: Patient will improve right grip strength by 10 pounds and pinch strength by 2 pounds  for greater ability to open jars. Short Term Goal 4 Progress: Met Short Term Goal 5: Patient will complete nine hole peg test with his right hand without assistance from OTR/L Short Term Goal 5 Progress: Met Long Term Goals Long Term Goal 1: Patient will use his right arm as dominant with daily activities.  Long Term Goal 1 Progress: Progressing toward goal Long Term Goal 2: Patient will improve RUE AROM to [facility] for greater use of RUE as dominant with functional activities.  Long Term Goal 2 Progress: Progressing toward goal Long Term Goal 3: Patient will increase right wrist strength to 4+/5 for greater ability to pick up bags of groceries. Long Term Goal 3 Progress: Progressing toward goal Long Term Goal 4: Patient will improve right grip strength by 15 pounds and pinch strength by 4 pounds for greater ability to open water bottles. Long Term Goal 4 Progress: Progressing toward goal Long Term Goal 5: Patient will complete nine hole peg test with his right hand in 3 minutes or less for greater ability to fasten buttons. Long Term Goal 5 Progress: Met Long Term Goal 6: Patient will improve light touch sensation from dulled to intact.  Long Term Goal 6 Progress: Progressing toward goal  Problem List Patient Active Problem List   Diagnosis Date Noted  . Lack of coordination 09/18/2013  . Poor balance 09/11/2013  . Difficulty in walking(719.7) 09/11/2013  . Hx of falling 09/11/2013  . Encounter for driver's license history and physical 08/27/2013  . Routine general medical examination at a health care facility 02/06/2013  . CVA (cerebral  vascular accident) 09/07/2012  . CKD (chronic kidney disease) stage 4, GFR 15-29 ml/min 09/07/2012  . Anemia 09/07/2012  . Cholelithiasis 08/10/2012  . Medically noncompliant 07/04/2012  . Shoulder pain, right 06/28/2012  . HTN (hypertension) 03/15/2012  . Bradycardia 03/15/2012  . Peripheral autonomic neuropathy due to diabetes mellitus 03/14/2012  .  Complex partial seizure disorder 03/07/2012  . Right hand weakness 02/04/2012  . Falls frequently 07/13/2011  . DM (diabetes mellitus) type II uncontrolled with renal manifestation 11/26/2010  . FATIGUE 01/01/2010  . UNSTEADY GAIT 11/20/2009  . NUMBNESS 11/20/2009  . HYPOTHYROIDISM 03/09/2008  . HYPERLIPIDEMIA 03/09/2008  . OBESITY 03/09/2008  . HYPERTENSION 03/09/2008  . PERIPHERAL VASCULAR DISEASE 03/09/2008  . DEGENERATIVE JOINT DISEASE, KNEE 01/26/2008  . DERANGEMENT MENISCUS 01/26/2008  . KNEE PAIN 01/26/2008  . SPONDYLOSIS 01/26/2008  . SPINAL STENOSIS 01/26/2008    End of Session Activity Tolerance: Patient tolerated treatment well General Behavior During Therapy: [facility] for tasks assessed/performed  GO    Donney Rankins, OTR/L  11/08/2013, 2:31 PM

## 2013-11-08 NOTE — Progress Notes (Signed)
Physical Therapy Treatment Patient Details  Name: Chase [name] MRN: [id number] Date of Birth: 03-19-1931  Today's Date: 11/08/2013 Time: [id number] PT Time Calculation (min): 43 min Charges: NMR x [id number]) Therex x [id number])  Visit#: 18 of 24  Re-eval: 11/17/13  Authorization: Medicare  Authorization Visit#: 18 of 20   Subjective: Symptoms/Limitations Symptoms: Pt states that he did some exercises of the Thanksgiving holiday but not many. Pain Assessment Currently in Pain?: Yes Pain Score: 5  Pain Location: Shoulder Pain Orientation: Right Multiple Pain Sites: No   Exercise/Treatments Balance Exercises Standing Wall Bumps: Hips;10 reps Heel Raises: 15 reps Toe Raise: 15 reps Other Standing Exercises: Hay bales x 10 bilateral; Standing trunk rotation x 5 each direction Other Standing Exercises: Standing with back to wall facilitating errect posture; bilateral shoulder flexion x 10 standing at wall with towel behind head  Seated Other Seated Exercises: Nustep hill level 3 resistance 4 10' SPM goal 100 to improve strength and activity tolerance   Physical Therapy Assessment and Plan PT Assessment and Plan Clinical Impression Statement: Pt is easily fatigued this session. This is most likely to having a break from therapy for the Thanksgiving holiday. Encouraged adherence to HEP to maximize benefits of therapy. Pt states that he feels better after he completes  exercises. Pt displays improved gait mechanics and posture at end of session. PT Plan: Focus on glute strength, improving trunk rotation while walking and balance.    Problem List Patient Active Problem List   Diagnosis Date Noted  . Lack of coordination 09/18/2013  . Poor balance 09/11/2013  . Difficulty in walking(719.7) 09/11/2013  . Hx of falling 09/11/2013  . Encounter for driver's license history and physical 08/27/2013  . Routine general medical examination at a health care facility 02/06/2013  .  CVA (cerebral vascular accident) 09/07/2012  . CKD (chronic kidney disease) stage 4, GFR 15-29 ml/min 09/07/2012  . Anemia 09/07/2012  . Cholelithiasis 08/10/2012  . Medically noncompliant 07/04/2012  . Shoulder pain, right 06/28/2012  . HTN (hypertension) 03/15/2012  . Bradycardia 03/15/2012  . Peripheral autonomic neuropathy due to diabetes mellitus 03/14/2012  . Complex partial seizure disorder 03/07/2012  . Right hand weakness 02/04/2012  . Falls frequently 07/13/2011  . DM (diabetes mellitus) type II uncontrolled with renal manifestation 11/26/2010  . FATIGUE 01/01/2010  . UNSTEADY GAIT 11/20/2009  . NUMBNESS 11/20/2009  . HYPOTHYROIDISM 03/09/2008  . HYPERLIPIDEMIA 03/09/2008  . OBESITY 03/09/2008  . HYPERTENSION 03/09/2008  . PERIPHERAL VASCULAR DISEASE 03/09/2008  . DEGENERATIVE JOINT DISEASE, KNEE 01/26/2008  . DERANGEMENT MENISCUS 01/26/2008  . KNEE PAIN 01/26/2008  . SPONDYLOSIS 01/26/2008  . SPINAL STENOSIS 01/26/2008    PT - End of Session Equipment Utilized During Treatment: Gait belt Activity Tolerance: Patient tolerated treatment well General Behavior During Therapy: [facility] for tasks assessed/performed  Rachelle Hora, PTA 11/08/2013, 3:50 PM

## 2013-11-10 ENCOUNTER — Ambulatory Visit (HOSPITAL_COMMUNITY): Payer: Medicare Other | Admitting: Physical Therapy

## 2013-11-10 ENCOUNTER — Ambulatory Visit (HOSPITAL_COMMUNITY)
Admission: RE | Admit: 2013-11-10 | Discharge: 2013-11-10 | Disposition: A | Discharge status: Home / Self Care | Payer: Medicare Other | Source: Ambulatory Visit | Attending: Family Medicine | Admitting: Family Medicine

## 2013-11-10 DIAGNOSIS — B351 Tinea unguium: Secondary | ICD-10-CM | POA: Diagnosis not present

## 2013-11-10 DIAGNOSIS — R2689 Other abnormalities of gait and mobility: Secondary | ICD-10-CM

## 2013-11-10 DIAGNOSIS — R262 Difficulty in walking, not elsewhere classified: Secondary | ICD-10-CM

## 2013-11-10 DIAGNOSIS — Z9181 History of falling: Secondary | ICD-10-CM

## 2013-11-10 DIAGNOSIS — L851 Acquired keratosis [keratoderma] palmaris et plantaris: Secondary | ICD-10-CM | POA: Diagnosis not present

## 2013-11-10 DIAGNOSIS — E1149 Type 2 diabetes mellitus with other diabetic neurological complication: Secondary | ICD-10-CM | POA: Diagnosis not present

## 2013-11-10 NOTE — Progress Notes (Signed)
Physical Therapy Treatment Patient Details  Name: Chase [name] MRN: [id number] Date of Birth: 21-Mar-1931  Today's Date: 11/10/2013 Time: [id number] PT Time Calculation (min): 45 min Neuromuscular re ed [id number] Visit#: 19 of 24  Re-eval: 11/17/13    Authorization: Medicare     Authorization Visit#: 19 of 20   Subjective: Symptoms/Limitations Symptoms: Pt has no complaint.   Pain Assessment Currently in Pain?: No/denies   Exercise/Treatments  Balance Exercises Standing Gait with Head Turns (Round Trips):  (gt training keeping tall and taking long steps) Marching Limitations: Without UE assistance Other Standing Exercises: weight shifting Rt to Lt;ant./post and figure 8 x 10  Other Standing Exercises: Standing with back to wall facilitating errect posture; bilateral shoulder flexion x 10 standing at wall with towel behind head  Yoga Poses    Seated Other Seated Exercises: Nustep hill level 3 resistance 4 10' SPM goal 100 to improve strength and activity tolerance Other Seated Exercises: dynadisc for pelvic ant/post, Rt/Lt and circles   Supine Other Supine Exercises: PROM for knee to chest and hamstretch x 1 min each B; bridging once up Rt hip advance then Lt rt then Lt x 10  Other Supine Exercises: trunk rotation x 10     Physical Therapy Assessment and Plan PT Assessment and Plan Clinical Impression Statement: Pt weight bears significantly more on Lt LE needs therapist facilitation while completing standing activites.  Pt gait significantly better after therapy treatment. PT Plan: Focus on glute strength, improving trunk rotation while walking and balance.  G code needed next treatment complete berg.  Goals    Problem List Patient Active Problem List   Diagnosis Date Noted  . Lack of coordination 09/18/2013  . Poor balance 09/11/2013  . Difficulty in walking(719.7) 09/11/2013  . Hx of falling 09/11/2013  . Encounter for driver's license history and physical  08/27/2013  . Routine general medical examination at a health care facility 02/06/2013  . CVA (cerebral vascular accident) 09/07/2012  . CKD (chronic kidney disease) stage 4, GFR 15-29 ml/min 09/07/2012  . Anemia 09/07/2012  . Cholelithiasis 08/10/2012  . Medically noncompliant 07/04/2012  . Shoulder pain, right 06/28/2012  . HTN (hypertension) 03/15/2012  . Bradycardia 03/15/2012  . Peripheral autonomic neuropathy due to diabetes mellitus 03/14/2012  . Complex partial seizure disorder 03/07/2012  . Right hand weakness 02/04/2012  . Falls frequently 07/13/2011  . DM (diabetes mellitus) type II uncontrolled with renal manifestation 11/26/2010  . FATIGUE 01/01/2010  . UNSTEADY GAIT 11/20/2009  . NUMBNESS 11/20/2009  . HYPOTHYROIDISM 03/09/2008  . HYPERLIPIDEMIA 03/09/2008  . OBESITY 03/09/2008  . HYPERTENSION 03/09/2008  . PERIPHERAL VASCULAR DISEASE 03/09/2008  . DEGENERATIVE JOINT DISEASE, KNEE 01/26/2008  . DERANGEMENT MENISCUS 01/26/2008  . KNEE PAIN 01/26/2008  . SPONDYLOSIS 01/26/2008  . SPINAL STENOSIS 01/26/2008    PT - End of Session Equipment Utilized During Treatment: Gait belt Activity Tolerance: Patient tolerated treatment well  GP    Akili Corsetti,CINDY 11/10/2013, 4:26 PM

## 2013-11-13 ENCOUNTER — Ambulatory Visit (HOSPITAL_COMMUNITY)
Admission: RE | Admit: 2013-11-13 | Discharge: 2013-11-13 | Disposition: A | Discharge status: Home / Self Care | Payer: Medicare Other | Source: Ambulatory Visit | Attending: Family Medicine | Admitting: Family Medicine

## 2013-11-13 DIAGNOSIS — R279 Unspecified lack of coordination: Secondary | ICD-10-CM

## 2013-11-13 NOTE — Progress Notes (Signed)
Occupational Therapy Treatment Patient Details  Name: Chase [name] MRN: [id number] Date of Birth: 1931/01/01  Today's Date: 11/13/2013 Time: [id number] OT Time Calculation (min): 45 min Therapeutic exercises 45'  Visit#: 9 of 12  Re-eval: 11/15/13    Authorization: Medicare  Authorization Time Period: before 10th visit  Authorization Visit#: 9 of 10  Subjective Symptoms/Limitations Symptoms: S:  I did my arm exercises this weekend.  Pain Assessment Currently in Pain?: Yes Pain Score: 2  Pain Location: Shoulder Pain Orientation: Right Pain Type: Chronic pain  Precautions/Restrictions   n/a  Exercise/Treatments ROM / Strengthening / Isometric Strengthening UBE (Upper Arm Bike): 3' and 3' 2/0 Wall Wash: 2'    Psychiatrist with Large Beads: hand gripper with square bocks 10 times  max difficulty maintaining hand position on yellow hand gripper and had greater success with black handgripper  Sponges: 9,7,5 Large Pegboard: with right hand 10 large pegs in and 10 large out         Occupational Therapy Assessment and Plan OT Assessment and Plan Clinical Impression Statement: A:  difficulty maintaing hand positioning on large pegs and on hand gripper this date.  OT Plan: P:  reassess, improve independence with grip on handtools.    Goals Short Term Goals Short Term Goal 1: Patient will be educated on HEP. Short Term Goal 2: Patient will improve RUE AROM by 10 degrees for greater use of RUE as dominant with functional activities.  Short Term Goal 3: Patient will increase right wrist strength to 4-/5 for greater ability to pick up bags of groceries. Short Term Goal 4: Patient will improve right grip strength by 10 pounds and pinch strength by 2 pounds for greater ability to open jars. Short Term Goal 5: Patient will complete nine hole peg test with his right hand without assistance from OTR/L Long Term Goals Long Term Goal 1: Patient will use his right arm as  dominant with daily activities.  Long Term Goal 2: Patient will improve RUE AROM to [facility] for greater use of RUE as dominant with functional activities.  Long Term Goal 3: Patient will increase right wrist strength to 4+/5 for greater ability to pick up bags of groceries. Long Term Goal 4: Patient will improve right grip strength by 15 pounds and pinch strength by 4 pounds for greater ability to open water bottles. Long Term Goal 5: Patient will complete nine hole peg test with his right hand in 3 minutes or less for greater ability to fasten buttons. Long Term Goal 6: Patient will improve light touch sensation from dulled to intact.   Problem List Patient Active Problem List   Diagnosis Date Noted  . Lack of coordination 09/18/2013  . Poor balance 09/11/2013  . Difficulty in walking(719.7) 09/11/2013  . Hx of falling 09/11/2013  . Encounter for driver's license history and physical 08/27/2013  . Routine general medical examination at a health care facility 02/06/2013  . CVA (cerebral vascular accident) 09/07/2012  . CKD (chronic kidney disease) stage 4, GFR 15-29 ml/min 09/07/2012  . Anemia 09/07/2012  . Cholelithiasis 08/10/2012  . Medically noncompliant 07/04/2012  . Shoulder pain, right 06/28/2012  . HTN (hypertension) 03/15/2012  . Bradycardia 03/15/2012  . Peripheral autonomic neuropathy due to diabetes mellitus 03/14/2012  . Complex partial seizure disorder 03/07/2012  . Right hand weakness 02/04/2012  . Falls frequently 07/13/2011  . DM (diabetes mellitus) type II uncontrolled with renal manifestation 11/26/2010  . FATIGUE 01/01/2010  . UNSTEADY GAIT 11/20/2009  .  NUMBNESS 11/20/2009  . HYPOTHYROIDISM 03/09/2008  . HYPERLIPIDEMIA 03/09/2008  . OBESITY 03/09/2008  . HYPERTENSION 03/09/2008  . PERIPHERAL VASCULAR DISEASE 03/09/2008  . DEGENERATIVE JOINT DISEASE, KNEE 01/26/2008  . DERANGEMENT MENISCUS 01/26/2008  . KNEE PAIN 01/26/2008  . SPONDYLOSIS 01/26/2008  . SPINAL  STENOSIS 01/26/2008    End of Session Activity Tolerance: Patient tolerated treatment well General Behavior During Therapy: [facility] for tasks assessed/performed  GO    Arbutus Ped 11/13/2013, 3:23 PM

## 2013-11-15 ENCOUNTER — Ambulatory Visit (HOSPITAL_COMMUNITY)
Admission: RE | Admit: 2013-11-15 | Discharge: 2013-11-15 | Disposition: A | Discharge status: Home / Self Care | Payer: Medicare Other | Source: Ambulatory Visit | Attending: Family Medicine | Admitting: Family Medicine

## 2013-11-15 NOTE — Evaluation (Addendum)
Occupational Therapy Discharge  Patient Details  Name: ANIVAL SALADO MRN: [id number] Date of Birth: [birth year]  Today's Date: 11/15/2013 Time: [id number] OT Time Calculation (min): 42 min Reassessment 75-1450 (71') TherExercises 1450-1514 (71')  Visit#: 84 of 12  Re-eval:    Assessment Diagnosis: Right Arm Pain  Next MD Visit: unknown Prior Therapy: currently receiving PT for balance deficits  Authorization: Medicare  Authorization Time Period: before 10th visit  Authorization Visit#: 10 of 10   Past Medical History:  Past Medical History  Diagnosis Date  . Peripheral vascular disease, unspecified   . Depressive disorder, not elsewhere classified   . Obesity   . Other and unspecified hyperlipidemia   . Pain in joint, lower leg   . Osteoarthrosis, unspecified whether generalized or localized, lower leg   . Derangement of meniscus, not elsewhere classified   . Lumbago   . Spondylosis of unspecified site without mention of myelopathy   . Spinal stenosis, unspecified region other than cervical   . Backache, unspecified   . Family history of diabetes mellitus   . Seizures   . Complete lesion of cervical spinal cord 03/14/3012    Stable since 2006  . Lacunar stroke, acute 03/14/2012  . Bradycardia 03/15/2012  . Gait disorder   . Knee contracture   . Diabetic neuropathy   . CKD (chronic kidney disease) stage 3, GFR 30-59 ml/min   . CKD (chronic kidney disease) stage 4, GFR 15-29 ml/min   . Diabetes mellitus approx 1994  . Hypothyroidism approx 2000  . Unspecified hypothyroidism   . Hypertension approx 1994  . Unspecified essential hypertension   . CVA (cerebrovascular accident) 09/07/12   Past Surgical History:  Past Surgical History  Procedure Laterality Date  . Kidney stones left x2  1975  . Kidney surgery      Ruptured left kidney 30 yrs ago  from a kidney stone  . Colonoscopy N/A 08/10/2013    Procedure: COLONOSCOPY;  Surgeon: Rogene Houston, MD;  Location: [facility];  Service: Endoscopy;  Laterality: N/A;  240    Subjective Symptoms/Limitations Symptoms: S:  I still feel it in my wrist and shoulder  Pain Assessment Currently in Pain?: Yes Pain Score: 2  Pain Location: Shoulder Pain Orientation: Right Pain Type: Chronic pain Multiple Pain Sites: Yes  Precautions/Restrictions  Precautions Precautions: Fall Restrictions Weight Bearing Restrictions: No  Balance Screening Balance Screen Has the patient fallen in the past 6 months: No  Prior Functioning  Prior Function Driving: No Vocation: Retired  Assessment ADL/Vision/Perception ADL ADL Comments: patient cont to report everything feels slick in his right hand  Dominant Hand: Right  Cognition/Observation Cognition Overall Cognitive Status: Within Functional Limits for tasks assessed  Sensation/Coordination/Edema Sensation Light Touch: Impaired by gross assessment Coordination 9 Hole Peg Test: R: 2'32..63";   L: 47.94" (R:  3'16.88;   L: 49.40)  Additional Assessments RUE AROM (degrees) RUE Overall AROM Comments: assessed in seated Right Shoulder Flexion: 120 Degrees (118) Right Shoulder ABduction: 105 Degrees (105) Right Shoulder Internal Rotation: 90 Degrees (90) Right Shoulder External Rotation: 35 Degrees (36) Right Elbow Flexion: 128 (129) Right Elbow Extension: -28 (-28) Right Forearm Pronation: 75 Degrees (75) Right Forearm Supination: 68 Degrees (67) Right Wrist Extension: 48 Degrees (48) Right Wrist Flexion: 48 Degrees (44) RUE Strength Grip (lbs): 35 (34 (Left 60)) Lateral Pinch: 12 lbs (10 (left 14)) 3 Point Pinch: 8 lbs (7 ( left 10)) Palpation Palpation: arthritic joints Right Hand Strength - Pinch (lbs)  Lateral Pinch: 12 lbs (10 (left 14)) 3 Point Pinch: 8 lbs (7 ( left 10))   Exercise/Treatments Flexion: AAROM;10 reps ABduction: AAROM;10 reps   Elbow Exercises Elbow Flexion: AAROM;10 reps Elbow Extension: AAROM;10 reps Forearm  Supination: AAROM;10 reps Forearm Pronation: AAROM;10 reps Wrist Flexion: AAROM;10 reps Wrist Extension: AAROM;10 reps   Occupational Therapy Assessment and Plan OT Assessment and Plan Clinical Impression Statement: A: Patient has made significant gains with strength and coordination however continues lacking in range of motion. Patient with improved posture and strength in bilateral shoulders as well as decline in complaint of pain. Patient met 3/5 STG and 2/6 LTG, partially meeting 1/4 additional LTG.  Recommend patient would benefit from use of pulleys in home setting to maintain joint mobility and prevent any further loss fo range. OT Plan:  Discharge from skilled OT services.  Cont with HEPs; will order pulleys for home.    Goals Short Term Goals Short Term Goal 1: Patient will be educated on HEP. Short Term Goal 1 Progress: Met Short Term Goal 2: Patient will improve RUE AROM by 10 degrees for greater use of RUE as dominant with functional activities.  Short Term Goal 2 Progress: Not met Short Term Goal 3: Patient will increase right wrist strength to 4-/5 for greater ability to pick up bags of groceries. Short Term Goal 3 Progress: Not met Short Term Goal 4: Patient will improve right grip strength by 10 pounds and pinch strength by 2 pounds for greater ability to open jars. Short Term Goal 4 Progress: Met Short Term Goal 5: Patient will complete nine hole peg test with his right hand without assistance from OTR/L Short Term Goal 5 Progress: Met Long Term Goals Long Term Goal 1: Patient will use his right arm as dominant with daily activities.  Long Term Goal 1 Progress: Partly met Long Term Goal 2: Patient will improve RUE AROM to [facility] for greater use of RUE as dominant with functional activities.  Long Term Goal 2 Progress: Not met Long Term Goal 3: Patient will increase right wrist strength to 4+/5 for greater ability to pick up bags of groceries. Long Term Goal 3 Progress: Not  met Long Term Goal 4: Patient will improve right grip strength by 15 pounds and pinch strength by 4 pounds for greater ability to open water bottles. Long Term Goal 4 Progress: Met Long Term Goal 5: Patient will complete nine hole peg test with his right hand in 3 minutes or less for greater ability to fasten buttons. Long Term Goal 5 Progress: Met Long Term Goal 6: Patient will improve light touch sensation from dulled to intact.  Long Term Goal 6 Progress: Not met  Problem List Patient Active Problem List   Diagnosis Date Noted  . Lack of coordination 09/18/2013  . Poor balance 09/11/2013  . Difficulty in walking(719.7) 09/11/2013  . Hx of falling 09/11/2013  . Encounter for driver's license history and physical 08/27/2013  . Routine general medical examination at a health care facility 02/06/2013  . CVA (cerebral vascular accident) 09/07/2012  . CKD (chronic kidney disease) stage 4, GFR 15-29 ml/min 09/07/2012  . Anemia 09/07/2012  . Cholelithiasis 08/10/2012  . Medically noncompliant 07/04/2012  . Shoulder pain, right 06/28/2012  . HTN (hypertension) 03/15/2012  . Bradycardia 03/15/2012  . Peripheral autonomic neuropathy due to diabetes mellitus 03/14/2012  . Complex partial seizure disorder 03/07/2012  . Right hand weakness 02/04/2012  . Falls frequently 07/13/2011  . DM (diabetes mellitus) type II  uncontrolled with renal manifestation 11/26/2010  . FATIGUE 01/01/2010  . UNSTEADY GAIT 11/20/2009  . NUMBNESS 11/20/2009  . HYPOTHYROIDISM 03/09/2008  . HYPERLIPIDEMIA 03/09/2008  . OBESITY 03/09/2008  . HYPERTENSION 03/09/2008  . PERIPHERAL VASCULAR DISEASE 03/09/2008  . DEGENERATIVE JOINT DISEASE, KNEE 01/26/2008  . DERANGEMENT MENISCUS 01/26/2008  . KNEE PAIN 01/26/2008  . SPONDYLOSIS 01/26/2008  . SPINAL STENOSIS 01/26/2008    End of Session Activity Tolerance: Patient tolerated treatment well General Behavior During Therapy: WFL for tasks  assessed/performed  GO Functional Assessment Tool Used: DASH scored 39 (DASH scored 56 initial) with ideal score being 0. Functional Limitation: Carrying, moving and handling objects Carrying, Moving and Handling Objects Goal Status [phone]): At least 20 percent but less than 40 percent impaired, limited or restricted Carrying, Moving and Handling Objects Discharge Status [phone]): At least 20 percent but less than 40 percent impaired, limited or restricted  Donney Rankins, OTR/L  11/15/2013, 11:49 PM  Physician Documentation Your signature is required to indicate approval of the treatment plan as stated above.  Please sign and either send electronically or make a copy of this report for your files and return this physician signed original.  Please mark one 1.__approve of plan  2. ___approve of plan with the following conditions.   ______________________________                                                          _____________________ Physician Signature                                                                                                             Date

## 2013-11-15 NOTE — Progress Notes (Addendum)
Physical Therapy Treatment (G-code update) Patient Details  Name: Chase [name] MRN: [id number] Date of Birth: 1931-03-10  Today's Date: 11/15/2013 Time: 1345-1430 PT Time Calculation (min): 45 min Charges: PPT x [id number]) NMR x [id number]) Therex x [id number])  Visit#: 20 of 24  Re-eval: 11/17/13  Authorization: Medicare  Authorization Visit#: 20 of 20   Subjective: Symptoms/Limitations Symptoms: Pt states that he feels stiff today. Pain Assessment Currently in Pain?: Yes Pain Score: 2  Pain Location: Shoulder Pain Orientation: Right Pain Type: Chronic pain Multiple Pain Sites: No  Precautions/Restrictions  Precautions Precautions: Fall Restrictions Weight Bearing Restrictions: No  Exercise/Treatments Berg Balance Test Sit to Stand: Able to stand  independently using hands Standing Unsupported: Able to stand safely 2 minutes Sitting with Back Unsupported but Feet Supported on Floor or Stool: Able to sit safely and securely 2 minutes Stand to Sit: Sits safely with minimal use of hands Transfers: Able to transfer safely, definite need of hands Standing Unsupported with Eyes Closed: Able to stand 10 seconds safely Standing Ubsupported with Feet Together: Able to place feet together independently and stand 1 minute safely From Standing, Reach Forward with Outstretched Arm: Can reach forward >12 cm safely (5") From Standing Position, Pick up Object from Floor: Able to pick up shoe, needs supervision From Standing Position, Turn to Look Behind Over each Shoulder: Turn sideways only but maintains balance Turn 360 Degrees: Able to turn 360 degrees safely but slowly Standing Unsupported, Alternately Place Feet on Step/Stool: Able to stand independently and complete 8 steps >20 seconds Standing Unsupported, One Foot in Front: Able to plae foot ahead of the other independently and hold 30 seconds Standing on One Leg: Able to lift leg independently and hold equal to or  more than 3 seconds Total Score: 44 was 39 on 11/12  Supine Bridges: 10 reps Straight Leg Raises: 10 reps Knee Extension: PROM;Both  Balance Exercises Standing Other Standing Exercises: Standing trunk rotation x 10; figure 8 x 10; weight ahift R/L x 10 Other Standing Exercises: Standing with back to wall facilitating errect posture; bilateral shoulder flexion x 10 standing at wall with towel behind head   Physical Therapy Assessment and Plan PT Assessment and Plan Clinical Impression Statement: Treatment focus on improving posture, gait and balance. Encouraged pt to complete HEP twice a day every day at home. Pt displays 5 point improvement with BERG test. Pt is progressing well toward goals.  PT Plan: Focus on glute strength, improving trunk rotation while walking and balance.    Goals    Problem List Patient Active Problem List   Diagnosis Date Noted  . Lack of coordination 09/18/2013  . Poor balance 09/11/2013  . Difficulty in walking(719.7) 09/11/2013  . Hx of falling 09/11/2013  . Encounter for driver's license history and physical 08/27/2013  . Routine general medical examination at a health care facility 02/06/2013  . CVA (cerebral vascular accident) 09/07/2012  . CKD (chronic kidney disease) stage 4, GFR 15-29 ml/min 09/07/2012  . Anemia 09/07/2012  . Cholelithiasis 08/10/2012  . Medically noncompliant 07/04/2012  . Shoulder pain, right 06/28/2012  . HTN (hypertension) 03/15/2012  . Bradycardia 03/15/2012  . Peripheral autonomic neuropathy due to diabetes mellitus 03/14/2012  . Complex partial seizure disorder 03/07/2012  . Right hand weakness 02/04/2012  . Falls frequently 07/13/2011  . DM (diabetes mellitus) type II uncontrolled with renal manifestation 11/26/2010  . FATIGUE 01/01/2010  . UNSTEADY GAIT 11/20/2009  . NUMBNESS 11/20/2009  . HYPOTHYROIDISM 03/09/2008  . HYPERLIPIDEMIA 03/09/2008  .  OBESITY 03/09/2008  . HYPERTENSION 03/09/2008  . PERIPHERAL  VASCULAR DISEASE 03/09/2008  . DEGENERATIVE JOINT DISEASE, KNEE 01/26/2008  . DERANGEMENT MENISCUS 01/26/2008  . KNEE PAIN 01/26/2008  . SPONDYLOSIS 01/26/2008  . SPINAL STENOSIS 01/26/2008    PT - End of Session Equipment Utilized During Treatment: Gait belt Activity Tolerance: Patient tolerated treatment well General Behavior During Therapy: WFL for tasks assessed/performed  GP Functional Assessment Tool Used: Berg Functional Limitation: Changing and maintaining body position Changing and Maintaining Body Position Current Status [id number]): At least 20 percent but less than 40 percent impaired, limited or restricted Changing and Maintaining Body Position Goal Status [id number]): At least 1 percent but less than 20 percent impaired, limited or restricted  Rachelle Hora, PTA  11/15/2013, 4:57 PM

## 2013-11-17 ENCOUNTER — Ambulatory Visit (HOSPITAL_COMMUNITY): Payer: Medicare Other | Admitting: Occupational Therapy

## 2013-11-20 ENCOUNTER — Ambulatory Visit (HOSPITAL_COMMUNITY)
Admission: RE | Admit: 2013-11-20 | Discharge: 2013-11-20 | Disposition: A | Discharge status: Home / Self Care | Payer: Medicare Other | Source: Ambulatory Visit | Attending: Family Medicine | Admitting: Family Medicine

## 2013-11-20 ENCOUNTER — Ambulatory Visit (HOSPITAL_COMMUNITY): Payer: Medicare Other | Admitting: Occupational Therapy

## 2013-11-20 DIAGNOSIS — IMO0001 Reserved for inherently not codable concepts without codable children: Secondary | ICD-10-CM | POA: Diagnosis not present

## 2013-11-20 DIAGNOSIS — I10 Essential (primary) hypertension: Secondary | ICD-10-CM | POA: Diagnosis not present

## 2013-11-20 DIAGNOSIS — R2689 Other abnormalities of gait and mobility: Secondary | ICD-10-CM

## 2013-11-20 DIAGNOSIS — E785 Hyperlipidemia, unspecified: Secondary | ICD-10-CM | POA: Diagnosis not present

## 2013-11-20 DIAGNOSIS — Z9181 History of falling: Secondary | ICD-10-CM

## 2013-11-20 DIAGNOSIS — R262 Difficulty in walking, not elsewhere classified: Secondary | ICD-10-CM

## 2013-11-20 NOTE — Progress Notes (Signed)
Physical Therapy Treatment Patient Details  Name: Chase [name] MRN: [id number] Date of Birth: [birth year]  Today's Date: 11/20/2013 Time: [id number] PT Time Calculation (min): 50 min Neuromuscular re-ed [id number] Visit#: 21 of 24  Authorization:   medicare Subjective: Symptoms/Limitations Symptoms: Pt has not ate since yesterday as he had blood drawn today at 1:00 Pain Assessment Currently in Pain?: No/denies   Exercise/Treatments  Balance Exercises Standing Tandem Stance Limitations: x2 SLS: 5 reps Cone Rotation: Solid surface;R/L Marching: 10 reps Sit to Stand: Standard surface;Limitations Sit to Stand Limitations: 10x Other Standing Exercises: Standing trunk rotation x 10; figure 8 x 10; weight ahift R/L x 10 Other Standing Exercises: Standing with back to wall facilitating errect posture; bilateral shoulder flexion x 10 standing at wall with towel behind head  Seated Other Seated Exercises: Nustep hill level 3 resistance 4 10' SPM goal 100 to improve strength and activity tolerance      Physical Therapy Assessment and Plan PT Assessment and Plan Clinical Impression Statement: Pt completed balance exercises facilitated by therapist to make sure pt was standing erect and keeping core stabilized PT Plan: Focus on glute strength, improving trunk rotation while walking and balance.    Goals  progressing  Problem List Patient Active Problem List   Diagnosis Date Noted  . Lack of coordination 09/18/2013  . Poor balance 09/11/2013  . Difficulty in walking(719.7) 09/11/2013  . Hx of falling 09/11/2013  . Encounter for driver's license history and physical 08/27/2013  . Routine general medical examination at a health care facility 02/06/2013  . CVA (cerebral vascular accident) 09/07/2012  . CKD (chronic kidney disease) stage 4, GFR 15-29 ml/min 09/07/2012  . Anemia 09/07/2012  . Cholelithiasis 08/10/2012  . Medically noncompliant 07/04/2012  . Shoulder pain, right  06/28/2012  . HTN (hypertension) 03/15/2012  . Bradycardia 03/15/2012  . Peripheral autonomic neuropathy due to diabetes mellitus 03/14/2012  . Complex partial seizure disorder 03/07/2012  . Right hand weakness 02/04/2012  . Falls frequently 07/13/2011  . DM (diabetes mellitus) type II uncontrolled with renal manifestation 11/26/2010  . FATIGUE 01/01/2010  . UNSTEADY GAIT 11/20/2009  . NUMBNESS 11/20/2009  . HYPOTHYROIDISM 03/09/2008  . HYPERLIPIDEMIA 03/09/2008  . OBESITY 03/09/2008  . HYPERTENSION 03/09/2008  . PERIPHERAL VASCULAR DISEASE 03/09/2008  . DEGENERATIVE JOINT DISEASE, KNEE 01/26/2008  . DERANGEMENT MENISCUS 01/26/2008  . KNEE PAIN 01/26/2008  . SPONDYLOSIS 01/26/2008  . SPINAL STENOSIS 01/26/2008    PT - End of Session Equipment Utilized During Treatment: Gait belt Activity Tolerance: Patient tolerated treatment well  GP    RUSSELL,CINDY 11/20/2013, 2:36 PM

## 2013-11-22 ENCOUNTER — Ambulatory Visit (HOSPITAL_COMMUNITY)
Admission: RE | Admit: 2013-11-22 | Discharge: 2013-11-22 | Disposition: A | Discharge status: Home / Self Care | Payer: Medicare Other | Source: Ambulatory Visit | Attending: Family Medicine | Admitting: Family Medicine

## 2013-11-22 ENCOUNTER — Ambulatory Visit (HOSPITAL_COMMUNITY): Payer: Medicare Other | Admitting: Occupational Therapy

## 2013-11-22 DIAGNOSIS — R2689 Other abnormalities of gait and mobility: Secondary | ICD-10-CM

## 2013-11-22 DIAGNOSIS — R262 Difficulty in walking, not elsewhere classified: Secondary | ICD-10-CM

## 2013-11-22 DIAGNOSIS — Z9181 History of falling: Secondary | ICD-10-CM

## 2013-11-22 NOTE — Progress Notes (Addendum)
Physical Therapy Treatment Patient Details  Name: Chase [name] MRN: [id number] Date of Birth: 21-Apr-1931  Today's Date: 11/22/2013 Time: [id number] PT Time Calculation (min): 51 min There ex [id number]; neuromuscular re-ed [id number] Visit#: 22 of 24  Re-eval: 11/25/13    Authorization: Medicare  Authorization Visit#: 21 of 30   Subjective: Symptoms/Limitations Symptoms: Pt states that his knees are bothering him today more swelling than ususal Pain Assessment Pain Score: 5  Pain Location: Knee Pain Orientation: Right;Left   Exercise/Treatments  Balance Exercises Standing Standing Eyes Closed: Narrow base of support (BOS) Tandem Stance Limitations: x2 Marching: 10 reps Sit to Stand Limitations: 10x Other Standing Exercises: supine:  k-chest; trunk rotation with arms, SL abduction; prone ham curl, hip extension and quad stretch all B     Seated Other Seated Exercises: Nustep hill level 3 resistance 4x 12';  SPM goal 100 to improve strength and activity tolerance       Physical Therapy Assessment and Plan PT Assessment and Plan Clinical Impression Statement: Pt WB activities limitied today secondary to knees being painful and swollen.  All exercises complleted with therapist facilitation to keep weight equal on B LE.  Pt off balance several times with standing activities with min assist needed from theapist to recover. Pt will benefit from skilled therapeutic intervention in order to improve on the following deficits: Decreased range of motion;Increased fascial restricitons;Impaired sensation;Increased muscle spasms;Decreased strength;Decreased coordination PT Plan: Focus on glute strength, improving trunk rotation while walking and balance.  Reevaluate next week  Goals  progressing   Problem List Patient Active Problem List   Diagnosis Date Noted  . Lack of coordination 09/18/2013  . Poor balance 09/11/2013  . Difficulty in walking(719.7) 09/11/2013  . Hx of falling  09/11/2013  . Encounter for driver's license history and physical 08/27/2013  . Routine general medical examination at a health care facility 02/06/2013  . CVA (cerebral vascular accident) 09/07/2012  . CKD (chronic kidney disease) stage 4, GFR 15-29 ml/min 09/07/2012  . Anemia 09/07/2012  . Cholelithiasis 08/10/2012  . Medically noncompliant 07/04/2012  . Shoulder pain, right 06/28/2012  . HTN (hypertension) 03/15/2012  . Bradycardia 03/15/2012  . Peripheral autonomic neuropathy due to diabetes mellitus 03/14/2012  . Complex partial seizure disorder 03/07/2012  . Right hand weakness 02/04/2012  . Falls frequently 07/13/2011  . DM (diabetes mellitus) type II uncontrolled with renal manifestation 11/26/2010  . FATIGUE 01/01/2010  . UNSTEADY GAIT 11/20/2009  . NUMBNESS 11/20/2009  . HYPOTHYROIDISM 03/09/2008  . HYPERLIPIDEMIA 03/09/2008  . OBESITY 03/09/2008  . HYPERTENSION 03/09/2008  . PERIPHERAL VASCULAR DISEASE 03/09/2008  . DEGENERATIVE JOINT DISEASE, KNEE 01/26/2008  . DERANGEMENT MENISCUS 01/26/2008  . KNEE PAIN 01/26/2008  . SPONDYLOSIS 01/26/2008  . SPINAL STENOSIS 01/26/2008    PT - End of Session Equipment Utilized During Treatment: Gait belt General Behavior During Therapy: [facility] for tasks assessed/performed  GP    RUSSELL,CINDY 11/22/2013, 1:57 PM

## 2013-11-24 ENCOUNTER — Ambulatory Visit (HOSPITAL_COMMUNITY): Payer: Medicare Other | Admitting: Occupational Therapy

## 2013-11-24 DIAGNOSIS — E785 Hyperlipidemia, unspecified: Secondary | ICD-10-CM | POA: Diagnosis not present

## 2013-11-24 DIAGNOSIS — E669 Obesity, unspecified: Secondary | ICD-10-CM | POA: Diagnosis not present

## 2013-11-24 DIAGNOSIS — E039 Hypothyroidism, unspecified: Secondary | ICD-10-CM | POA: Diagnosis not present

## 2013-11-24 DIAGNOSIS — IMO0001 Reserved for inherently not codable concepts without codable children: Secondary | ICD-10-CM | POA: Diagnosis not present

## 2013-11-27 ENCOUNTER — Ambulatory Visit (HOSPITAL_COMMUNITY)
Admission: RE | Admit: 2013-11-27 | Discharge: 2013-11-27 | Disposition: A | Discharge status: Home / Self Care | Payer: Medicare Other | Source: Ambulatory Visit | Attending: Family Medicine | Admitting: Family Medicine

## 2013-11-27 ENCOUNTER — Ambulatory Visit (HOSPITAL_COMMUNITY): Payer: Medicare Other | Admitting: Specialist

## 2013-11-27 NOTE — Evaluation (Addendum)
Physical Therapy Re-evaluation  Patient Details  Name: Chase [name] MRN: [id number] Date of Birth: January 07, 1931  Today's Date: 11/27/2013 Time: [id number] PT Time Calculation (min): 45 min Charges: MMT/ROMM x 1 [id number]) Self care x [id number])              Visit#: 23 of 37  Re-eval: 11/25/13  Authorization: Medicare    Authorization Visit#: 67 of 30   Past Medical History:  Past Medical History  Diagnosis Date  . Peripheral vascular disease, unspecified   . Depressive disorder, not elsewhere classified   . Obesity   . Other and unspecified hyperlipidemia   . Pain in joint, lower leg   . Osteoarthrosis, unspecified whether generalized or localized, lower leg   . Derangement of meniscus, not elsewhere classified   . Lumbago   . Spondylosis of unspecified site without mention of myelopathy   . Spinal stenosis, unspecified region other than cervical   . Backache, unspecified   . Family history of diabetes mellitus   . Seizures   . Complete lesion of cervical spinal cord 03/14/3012    Stable since 2006  . Lacunar stroke, acute 03/14/2012  . Bradycardia 03/15/2012  . Gait disorder   . Knee contracture   . Diabetic neuropathy   . CKD (chronic kidney disease) stage 3, GFR 30-59 ml/min   . CKD (chronic kidney disease) stage 4, GFR 15-29 ml/min   . Diabetes mellitus approx 1994  . Hypothyroidism approx 2000  . Unspecified hypothyroidism   . Hypertension approx 1994  . Unspecified essential hypertension   . CVA (cerebrovascular accident) 09/07/12   Past Surgical History:  Past Surgical History  Procedure Laterality Date  . Kidney stones left x2  1975  . Kidney surgery      Ruptured left kidney 30 yrs ago  from a kidney stone  . Colonoscopy N/A 08/10/2013    Procedure: COLONOSCOPY;  Surgeon: Rogene Houston, MD;  Location: [facility];  Service: Endoscopy;  Laterality: N/A;  240    Subjective Symptoms/Limitations Symptoms: Pt states that sit to stand and walkign has  gotten much easier for him since starting therapy. Pain Assessment Currently in Pain?: No/denies  Assessment RLE Strength Right Hip Flexion: 5/5 (was 5/5 (10/18/13)) Right Hip Extension: 4/5 (was 4/5 (10/18/13)) Right Hip ABduction: 5/5 (was 5/5 (10/18/13)) Right Hip ADduction: 5/5 (was 5/5 (10/18/13)) Right Knee Flexion: 5/5 (was 5/5 (10/18/13)) Right Knee Extension: 5/5 (was 5/5 (10/18/13)) Right Ankle Dorsiflexion: 5/5 (was 5/5 (10/18/13)) LLE Strength Left Hip Flexion: 5/5 (was 5/5 (10/18/13)) Left Hip Extension: 4/5 (was 3+/5 (10/18/13)) Left Hip ABduction: 5/5 (was 5/5 (10/18/13)) Left Knee Flexion: 5/5 (was 5/5 (10/18/13)) Left Knee Extension: 5/5 (was 5/5 (10/18/13)) Left Ankle Dorsiflexion: 5/5 (was 5/5 (10/18/13))  Exercise/Treatments  NuStep x 10' hills #3 L4  Mobility/Balance   Berg Balance Test Sit to Stand: Able to stand  independently using hands Standing Unsupported: Able to stand safely 2 minutes Sitting with Back Unsupported but Feet Supported on Floor or Stool: Able to sit safely and securely 2 minutes Stand to Sit: Sits safely with minimal use of hands Transfers: Able to transfer safely, definite need of hands Standing Unsupported with Eyes Closed: Able to stand 10 seconds safely Standing Ubsupported with Feet Together: Able to place feet together independently and stand 1 minute safely From Standing, Reach Forward with Outstretched Arm: Can reach forward >12 cm safely (5") From Standing Position, Pick up Object from Floor: Able to pick up shoe, needs supervision From  Standing Position, Turn to Look Behind Over each Shoulder: Turn sideways only but maintains balance Turn 360 Degrees: Able to turn 360 degrees safely but slowly Standing Unsupported, Alternately Place Feet on Step/Stool: Able to stand independently and complete 8 steps >20 seconds Standing Unsupported, One Foot in Front: Able to plae foot ahead of the other independently and hold 30  seconds Standing on One Leg: Able to lift leg independently and hold equal to or more than 3 seconds Total Score: 44   Physical Therapy Assessment and Plan PT Assessment and Plan Clinical Impression Statement: Pt continues to progress well with therapy. Pt displays improve strength, posture and balance. Pt would like to continue to improve functional strength to improve ease with transitions. How would also like to continues to progress his posture and balance. Explained to pt the importance of HEP compliance. Pt would benefit from continuing skilled PT to improve functional strength, mobility and balance for improved QOL. Pt will benefit from skilled therapeutic intervention in order to improve on the following deficits: Decreased range of motion;Increased fascial restricitons;Impaired sensation;Increased muscle spasms;Decreased strength;Decreased coordination PT Plan: Recommend continuing skilled PT to improve functional strength, mobility and balance to improve safety and over all QOL.  See pt one time a week for the next four weeks.  Goals Home Exercise Program Pt/caregiver will Perform Home Exercise Program: For increased strengthening PT Short Term Goals PT Short Term Goal 1: Berg improved by 5  PT Short Term Goal 1 - Progress: Met PT Short Term Goal 2: Pt to be able to come sit to stand with two attempts I PT Short Term Goal 2 - Progress: Met PT Short Term Goal 3: Pt to be able to walk for 10 minutes  PT Short Term Goal 3 - Progress: Met PT Long Term Goals Time to Complete Long Term Goals: 4 weeks PT Long Term Goal 1: Pt Berg to be improved 10 points  PT Long Term Goal 1 - Progress: Met PT Long Term Goal 2: Pt to have had no falls  PT Long Term Goal 2 - Progress: Met Long Term Goal 3: I in advance HEP Long Term Goal 3 Progress: Met Long Term Goal 4: Pt to be walking for 20 minutes  Long Term Goal 4 Progress: Progressing toward goal PT Long Term Goal 5: Pt to have only 10 degree  of forward bend when standing Long Term Goal 5 Progress: Met Additional PT Long Term Goals?: Yes PT Long Term Goal 6: able to come sit to stand in one attempt. Long Term Goal 6 Progress: Met  Problem List Patient Active Problem List   Diagnosis Date Noted  . Lack of coordination 09/18/2013  . Poor balance 09/11/2013  . Difficulty in walking(719.7) 09/11/2013  . Hx of falling 09/11/2013  . Encounter for driver's license history and physical 08/27/2013  . Routine general medical examination at a health care facility 02/06/2013  . CVA (cerebral vascular accident) 09/07/2012  . CKD (chronic kidney disease) stage 4, GFR 15-29 ml/min 09/07/2012  . Anemia 09/07/2012  . Cholelithiasis 08/10/2012  . Medically noncompliant 07/04/2012  . Shoulder pain, right 06/28/2012  . HTN (hypertension) 03/15/2012  . Bradycardia 03/15/2012  . Peripheral autonomic neuropathy due to diabetes mellitus 03/14/2012  . Complex partial seizure disorder 03/07/2012  . Right hand weakness 02/04/2012  . Falls frequently 07/13/2011  . DM (diabetes mellitus) type II uncontrolled with renal manifestation 11/26/2010  . FATIGUE 01/01/2010  . UNSTEADY GAIT 11/20/2009  . NUMBNESS 11/20/2009  .  HYPOTHYROIDISM 03/09/2008  . HYPERLIPIDEMIA 03/09/2008  . OBESITY 03/09/2008  . HYPERTENSION 03/09/2008  . PERIPHERAL VASCULAR DISEASE 03/09/2008  . DEGENERATIVE JOINT DISEASE, KNEE 01/26/2008  . DERANGEMENT MENISCUS 01/26/2008  . KNEE PAIN 01/26/2008  . SPONDYLOSIS 01/26/2008  . SPINAL STENOSIS 01/26/2008    PT - End of Session Equipment Utilized During Treatment: Gait belt Activity Tolerance: Patient tolerated treatment well General Behavior During Therapy: [facility] for tasks assessed/performed  Rachelle Hora, PTA 11/27/2013, 5:06 PM  Physician Documentation Your signature is required to indicate approval of the treatment plan as stated above.  Please sign and either send electronically or make a copy of this report  for your files and return this physician signed original.   Please mark one 1.__approve of plan  2. ___approve of plan with the following conditions.   ______________________________                                                          _____________________ Physician Signature                                                                                                             Date

## 2013-11-28 DIAGNOSIS — M159 Polyosteoarthritis, unspecified: Secondary | ICD-10-CM | POA: Diagnosis not present

## 2013-11-28 DIAGNOSIS — G40909 Epilepsy, unspecified, not intractable, without status epilepticus: Secondary | ICD-10-CM | POA: Diagnosis not present

## 2013-11-28 DIAGNOSIS — Z79899 Other long term (current) drug therapy: Secondary | ICD-10-CM | POA: Diagnosis not present

## 2013-11-28 DIAGNOSIS — M25519 Pain in unspecified shoulder: Secondary | ICD-10-CM | POA: Diagnosis not present

## 2013-11-29 ENCOUNTER — Ambulatory Visit (HOSPITAL_COMMUNITY): Payer: Medicare Other | Admitting: Occupational Therapy

## 2013-12-04 ENCOUNTER — Ambulatory Visit (HOSPITAL_COMMUNITY)
Admission: RE | Admit: 2013-12-04 | Discharge: 2013-12-04 | Disposition: A | Discharge status: Home / Self Care | Payer: Medicare Other | Source: Ambulatory Visit | Attending: Family Medicine | Admitting: Family Medicine

## 2013-12-04 ENCOUNTER — Ambulatory Visit (HOSPITAL_COMMUNITY): Payer: Medicare Other | Admitting: Occupational Therapy

## 2013-12-04 NOTE — Progress Notes (Signed)
Physical Therapy Treatment Patient Details  Name: Chase [name] MRN: [id number] Date of Birth: 1931-08-24  Today's Date: 12/04/2013 Time: [id number] PT Time Calculation (min): 43 min Charges: NMR x [id number]) Therex x [id number])  Visit#: 24 of 80  Re-eval: 11/25/13  Authorization: Medicare  Authorization Visit#: 24 of 30   Subjective: Symptoms/Limitations Symptoms: Pt states that he has not geotten on his stomach since last session. Pain Assessment Currently in Pain?: No/denies  Exercise/Treatments Standing Gait Training: With canes in BUE with manual assistance to improve arm swing and trunk rotation Prone  Hamstring Curl: 10 reps Other Prone Exercises: Passive quad stretch 2x 30"  Balance Exercises Standing Marching: 10 reps Other Standing Exercises: Standing with back to wall facilitating errect posture; bilateral shoulder flexion x 10 standing at wall with towel behind head  Seated Other Seated Exercises: Nustep hill level 3 resistance 4 10' SPM goal 100 to improve strength and activity tolerance    Physical Therapy Assessment and Plan PT Assessment and Plan Clinical Impression Statement: Therapist facilitated activities to improve balance, functional strength and mobility. Encouraged pt to improve adherence to HEP. Simulated bed height with mat table and pt was able to assume prone position independently. Pt reports decreased stiffness after lying prone. Pt will benefit from skilled therapeutic intervention in order to improve on the following deficits: Decreased range of motion;Increased fascial restricitons;Impaired sensation;Increased muscle spasms;Decreased strength;Decreased coordination PT Plan: Continue to progress functional strength, mobility and balance to improve safety and over all QOL.    Problem List Patient Active Problem List   Diagnosis Date Noted  . Lack of coordination 09/18/2013  . Poor balance 09/11/2013  . Difficulty in walking(719.7)  09/11/2013  . Hx of falling 09/11/2013  . Encounter for driver's license history and physical 08/27/2013  . Routine general medical examination at a health care facility 02/06/2013  . CVA (cerebral vascular accident) 09/07/2012  . CKD (chronic kidney disease) stage 4, GFR 15-29 ml/min 09/07/2012  . Anemia 09/07/2012  . Cholelithiasis 08/10/2012  . Medically noncompliant 07/04/2012  . Shoulder pain, right 06/28/2012  . HTN (hypertension) 03/15/2012  . Bradycardia 03/15/2012  . Peripheral autonomic neuropathy due to diabetes mellitus 03/14/2012  . Complex partial seizure disorder 03/07/2012  . Right hand weakness 02/04/2012  . Falls frequently 07/13/2011  . DM (diabetes mellitus) type II uncontrolled with renal manifestation 11/26/2010  . FATIGUE 01/01/2010  . UNSTEADY GAIT 11/20/2009  . NUMBNESS 11/20/2009  . HYPOTHYROIDISM 03/09/2008  . HYPERLIPIDEMIA 03/09/2008  . OBESITY 03/09/2008  . HYPERTENSION 03/09/2008  . PERIPHERAL VASCULAR DISEASE 03/09/2008  . DEGENERATIVE JOINT DISEASE, KNEE 01/26/2008  . DERANGEMENT MENISCUS 01/26/2008  . KNEE PAIN 01/26/2008  . SPONDYLOSIS 01/26/2008  . SPINAL STENOSIS 01/26/2008    PT - End of Session Equipment Utilized During Treatment: Gait belt Activity Tolerance: Patient tolerated treatment well General Behavior During Therapy: [facility] for tasks assessed/performed   Rachelle Hora, PTA  12/04/2013, 4:15 PM

## 2013-12-06 ENCOUNTER — Ambulatory Visit (HOSPITAL_COMMUNITY): Payer: Medicare Other | Admitting: Occupational Therapy

## 2013-12-06 ENCOUNTER — Ambulatory Visit (HOSPITAL_COMMUNITY): Payer: Medicare Other | Admitting: *Deleted

## 2013-12-11 ENCOUNTER — Ambulatory Visit (HOSPITAL_COMMUNITY)
Admission: RE | Admit: 2013-12-11 | Discharge: 2013-12-11 | Disposition: A | Discharge status: Home / Self Care | Payer: Medicare Other | Source: Ambulatory Visit | Attending: Family Medicine | Admitting: Family Medicine

## 2013-12-11 DIAGNOSIS — E1149 Type 2 diabetes mellitus with other diabetic neurological complication: Secondary | ICD-10-CM | POA: Diagnosis not present

## 2013-12-11 DIAGNOSIS — I10 Essential (primary) hypertension: Secondary | ICD-10-CM | POA: Insufficient documentation

## 2013-12-11 DIAGNOSIS — M25569 Pain in unspecified knee: Secondary | ICD-10-CM | POA: Diagnosis not present

## 2013-12-11 DIAGNOSIS — IMO0001 Reserved for inherently not codable concepts without codable children: Secondary | ICD-10-CM | POA: Diagnosis not present

## 2013-12-11 DIAGNOSIS — E1142 Type 2 diabetes mellitus with diabetic polyneuropathy: Secondary | ICD-10-CM | POA: Diagnosis not present

## 2013-12-11 DIAGNOSIS — R269 Unspecified abnormalities of gait and mobility: Secondary | ICD-10-CM | POA: Diagnosis not present

## 2013-12-11 NOTE — Progress Notes (Signed)
Physical Therapy Treatment Patient Details  Name: Chase [name] MRN: [id number] Date of Birth: [birth year]  Today's Date: 12/11/2013 Time: [id number] PT Time Calculation (min): 48 min  Visit#: 25 of 27  Re-eval: 11/25/13 Authorization: Medicare  Authorization Visit#: 25 of 30  Charges:  therex 44  Subjective: Symptoms/Limitations Symptoms: Pt states he's been doing his HEP more often, however still not everyday.  No current complaints or pain reported. Pain Assessment Currently in Pain?: No/denies   Exercise/Treatments Prone  Hamstring Curl: 15 reps Other Prone Exercises: Passive quad stretch 2x 30" Standing SLS: 5 reps SLS Limitations: max 7" on Lt LE, 3" on Rt LE Other Standing Exercises: toe taps 6" no UE's 10 reps  Seated Other Seated Exercises: Nustep hill level 3 resistance 4 10' SPM goal 100 to improve strength and activity tolerance      Physical Therapy Assessment and Plan PT Assessment:  Pt continues to do well in therapy.  Able to assume prone position from seated position and return to EOB without assistance.  Fixed padding on SPC due to gripper falling off. PT Plan: Continue to progress functional strength, mobility and balance to improve safety and over all QOL. X 2 more weeks.     Problem List Patient Active Problem List   Diagnosis Date Noted  . Lack of coordination 09/18/2013  . Poor balance 09/11/2013  . Difficulty in walking(719.7) 09/11/2013  . Hx of falling 09/11/2013  . Encounter for driver's license history and physical 08/27/2013  . Routine general medical examination at a health care facility 02/06/2013  . CVA (cerebral vascular accident) 09/07/2012  . CKD (chronic kidney disease) stage 4, GFR 15-29 ml/min 09/07/2012  . Anemia 09/07/2012  . Cholelithiasis 08/10/2012  . Medically noncompliant 07/04/2012  . Shoulder pain, right 06/28/2012  . HTN (hypertension) 03/15/2012  . Bradycardia 03/15/2012  . Peripheral autonomic neuropathy due to  diabetes mellitus 03/14/2012  . Complex partial seizure disorder 03/07/2012  . Right hand weakness 02/04/2012  . Falls frequently 07/13/2011  . DM (diabetes mellitus) type II uncontrolled with renal manifestation 11/26/2010  . FATIGUE 01/01/2010  . UNSTEADY GAIT 11/20/2009  . NUMBNESS 11/20/2009  . HYPOTHYROIDISM 03/09/2008  . HYPERLIPIDEMIA 03/09/2008  . OBESITY 03/09/2008  . HYPERTENSION 03/09/2008  . PERIPHERAL VASCULAR DISEASE 03/09/2008  . DEGENERATIVE JOINT DISEASE, KNEE 01/26/2008  . DERANGEMENT MENISCUS 01/26/2008  . KNEE PAIN 01/26/2008  . SPONDYLOSIS 01/26/2008  . SPINAL STENOSIS 01/26/2008    PT - End of Session Equipment Utilized During Treatment: Gait belt Activity Tolerance: Patient tolerated treatment well General Behavior During Therapy: [facility] for tasks assessed/performed   Teena Irani, PTA/CLT 12/11/2013, 3:29 PM

## 2013-12-18 ENCOUNTER — Ambulatory Visit (HOSPITAL_COMMUNITY)
Admission: RE | Admit: 2013-12-18 | Discharge: 2013-12-18 | Disposition: A | Discharge status: Home / Self Care | Payer: Medicare Other | Source: Ambulatory Visit | Attending: Family Medicine | Admitting: Family Medicine

## 2013-12-18 DIAGNOSIS — E1149 Type 2 diabetes mellitus with other diabetic neurological complication: Secondary | ICD-10-CM | POA: Diagnosis not present

## 2013-12-18 DIAGNOSIS — M25569 Pain in unspecified knee: Secondary | ICD-10-CM | POA: Diagnosis not present

## 2013-12-18 DIAGNOSIS — IMO0001 Reserved for inherently not codable concepts without codable children: Secondary | ICD-10-CM | POA: Diagnosis not present

## 2013-12-18 DIAGNOSIS — I10 Essential (primary) hypertension: Secondary | ICD-10-CM | POA: Diagnosis not present

## 2013-12-18 DIAGNOSIS — E1142 Type 2 diabetes mellitus with diabetic polyneuropathy: Secondary | ICD-10-CM | POA: Diagnosis not present

## 2013-12-18 DIAGNOSIS — R269 Unspecified abnormalities of gait and mobility: Secondary | ICD-10-CM | POA: Diagnosis not present

## 2013-12-18 NOTE — Progress Notes (Signed)
Physical Therapy Treatment Patient Details  Name: Chase [name] MRN: [id number] Date of Birth: 01-24-1931  Today's Date: 12/18/2013 Time: [id number] PT Time Calculation (min): 43 min Charges: Therex x [id number])  NMR x [id number])  Visit#: 26 of 27  Re-eval: 11/25/13  Authorization: Medicare   Authorization Visit#: 26 of 30   Subjective: Symptoms/Limitations Symptoms: Pt states that he has been completing "some" of his exercises every day. He reprots that he spends 57' on them each day. Pain Assessment Currently in Pain?: No/denies  Exercise/Treatments Aerobic Stationary Bike: NuStep 10' L4 hills #3 Prone  Hamstring Curl: 15 reps Other Prone Exercises: Passive quad stretch 2x 30" Standing Sidestepping: 2 reps Marching: 10 reps Sit to Stand Limitations: 5 reps without UE assistance focusing on correct posture Other Standing Exercises: trunk rotation x 5 bialteral Other Standing Exercises: Standing with back to wall facilitating errect posture; bilateral shoulder flexion x 10 standing at wall with towel behind head   Physical Therapy Assessment and Plan PT Assessment and Plan Clinical Impression Statement: Therapist facilitated activities to improve balance, functional strength and mobility. Encouraged pt to lie prone at home when possible. Pt displays improved posture and stability with standing activities. Pt will benefit from skilled therapeutic intervention in order to improve on the following deficits: Decreased range of motion;Increased fascial restricitons;Impaired sensation;Increased muscle spasms;Decreased strength;Decreased coordination PT Plan: Reassess next session.    Problem List Patient Active Problem List   Diagnosis Date Noted  . Lack of coordination 09/18/2013  . Poor balance 09/11/2013  . Difficulty in walking(719.7) 09/11/2013  . Hx of falling 09/11/2013  . Encounter for driver's license history and physical 08/27/2013  . Routine general medical  examination at a health care facility 02/06/2013  . CVA (cerebral vascular accident) 09/07/2012  . CKD (chronic kidney disease) stage 4, GFR 15-29 ml/min 09/07/2012  . Anemia 09/07/2012  . Cholelithiasis 08/10/2012  . Medically noncompliant 07/04/2012  . Shoulder pain, right 06/28/2012  . HTN (hypertension) 03/15/2012  . Bradycardia 03/15/2012  . Peripheral autonomic neuropathy due to diabetes mellitus 03/14/2012  . Complex partial seizure disorder 03/07/2012  . Right hand weakness 02/04/2012  . Falls frequently 07/13/2011  . DM (diabetes mellitus) type II uncontrolled with renal manifestation 11/26/2010  . FATIGUE 01/01/2010  . UNSTEADY GAIT 11/20/2009  . NUMBNESS 11/20/2009  . HYPOTHYROIDISM 03/09/2008  . HYPERLIPIDEMIA 03/09/2008  . OBESITY 03/09/2008  . HYPERTENSION 03/09/2008  . PERIPHERAL VASCULAR DISEASE 03/09/2008  . DEGENERATIVE JOINT DISEASE, KNEE 01/26/2008  . DERANGEMENT MENISCUS 01/26/2008  . KNEE PAIN 01/26/2008  . SPONDYLOSIS 01/26/2008  . SPINAL STENOSIS 01/26/2008    PT - End of Session Equipment Utilized During Treatment: Gait belt Activity Tolerance: Patient tolerated treatment well General Behavior During Therapy: [facility] for tasks assessed/performed  Rachelle Hora, PTA 12/18/2013, 5:41 PM

## 2013-12-25 ENCOUNTER — Ambulatory Visit (HOSPITAL_COMMUNITY): Payer: Medicare Other | Admitting: Physical Therapy

## 2013-12-28 ENCOUNTER — Ambulatory Visit (HOSPITAL_COMMUNITY)
Admission: RE | Admit: 2013-12-28 | Discharge: 2013-12-28 | Disposition: A | Discharge status: Home / Self Care | Payer: Medicare Other | Source: Ambulatory Visit | Attending: Family Medicine | Admitting: Family Medicine

## 2013-12-28 DIAGNOSIS — IMO0001 Reserved for inherently not codable concepts without codable children: Secondary | ICD-10-CM | POA: Diagnosis not present

## 2013-12-28 DIAGNOSIS — E1149 Type 2 diabetes mellitus with other diabetic neurological complication: Secondary | ICD-10-CM | POA: Diagnosis not present

## 2013-12-28 DIAGNOSIS — E1142 Type 2 diabetes mellitus with diabetic polyneuropathy: Secondary | ICD-10-CM | POA: Diagnosis not present

## 2013-12-28 DIAGNOSIS — M25569 Pain in unspecified knee: Secondary | ICD-10-CM | POA: Diagnosis not present

## 2013-12-28 DIAGNOSIS — I10 Essential (primary) hypertension: Secondary | ICD-10-CM | POA: Diagnosis not present

## 2013-12-28 DIAGNOSIS — R269 Unspecified abnormalities of gait and mobility: Secondary | ICD-10-CM | POA: Diagnosis not present

## 2013-12-28 NOTE — Evaluation (Signed)
Physical Therapy Discharge Summary  Patient Details  Name: Chase [name] MRN: [id number] Date of Birth: 25-Jun-1931  Today's Date: 12/28/2013 Time: 0932-1000 PT Time Calculation (min): 28 min Charges: PPT x 10'(0932-0942) MMT x 5(0388-8280) Self care x 03'(4917-9150)              Visit#: 27 of 50  Re-eval: 11/25/13  Authorization: Medicare    Authorization Visit#: 90 of 36   Past Medical History:  Past Medical History  Diagnosis Date  . Peripheral vascular disease, unspecified   . Depressive disorder, not elsewhere classified   . Obesity   . Other and unspecified hyperlipidemia   . Pain in joint, lower leg   . Osteoarthrosis, unspecified whether generalized or localized, lower leg   . Derangement of meniscus, not elsewhere classified   . Lumbago   . Spondylosis of unspecified site without mention of myelopathy   . Spinal stenosis, unspecified region other than cervical   . Backache, unspecified   . Family history of diabetes mellitus   . Seizures   . Complete lesion of cervical spinal cord 03/14/3012    Stable since 2006  . Lacunar stroke, acute 03/14/2012  . Bradycardia 03/15/2012  . Gait disorder   . Knee contracture   . Diabetic neuropathy   . CKD (chronic kidney disease) stage 3, GFR 30-59 ml/min   . CKD (chronic kidney disease) stage 4, GFR 15-29 ml/min   . Diabetes mellitus approx 1994  . Hypothyroidism approx 2000  . Unspecified hypothyroidism   . Hypertension approx 1994  . Unspecified essential hypertension   . CVA (cerebrovascular accident) 09/07/12   Past Surgical History:  Past Surgical History  Procedure Laterality Date  . Kidney stones left x2  1975  . Kidney surgery      Ruptured left kidney 30 yrs ago  from a kidney stone  . Colonoscopy N/A 08/10/2013    Procedure: COLONOSCOPY;  Surgeon: Rogene Houston, MD;  Location: [facility];  Service: Endoscopy;  Laterality: N/A;  240    Subjective Symptoms/Limitations Symptoms: "I've been doing my exercises  and laying on my stomach at home." Pain Assessment Currently in Pain?: No/denies  Assessment RLE Strength Right Hip Flexion: 5/5 Right Hip Extension: 4/5 Right Hip ABduction: 5/5 Right Hip ADduction: 5/5 Right Knee Flexion: 5/5 Right Knee Extension: 5/5 LLE Strength Left Hip Flexion: 5/5 Left Hip Extension: 4/5 Left Hip ABduction: 5/5 Left Hip ADduction: 5/5 Left Knee Flexion: 5/5 Left Knee Extension: 5/5 Left Ankle Dorsiflexion: 5/5 (No change in strength since 11/15/13)  Exercise/Treatments Mobility/Balance  Berg Balance Test Sit to Stand: Able to stand  independently using hands Standing Unsupported: Able to stand safely 2 minutes Sitting with Back Unsupported but Feet Supported on Floor or Stool: Able to sit safely and securely 2 minutes Stand to Sit: Sits safely with minimal use of hands Transfers: Able to transfer safely, definite need of hands Standing Unsupported with Eyes Closed: Able to stand 10 seconds safely Standing Ubsupported with Feet Together: Able to place feet together independently and stand 1 minute safely From Standing, Reach Forward with Outstretched Arm: Can reach forward >12 cm safely (5") From Standing Position, Pick up Object from Floor: Able to pick up shoe, needs supervision From Standing Position, Turn to Look Behind Over each Shoulder: Turn sideways only but maintains balance Turn 360 Degrees: Able to turn 360 degrees safely but slowly Standing Unsupported, Alternately Place Feet on Step/Stool: Able to stand independently and complete 8 steps >20 seconds Standing  Unsupported, One Foot in Front: Able to plae foot ahead of the other independently and hold 30 seconds Standing on One Leg: Able to lift leg independently and hold equal to or more than 3 seconds Total Score: 44  (No change in score since 11/15/13)  Physical Therapy Assessment and Plan PT Assessment and Plan Clinical Impression Statement: Pt has progressed well overall. Over that  past 4 weeks pt appears to have plateaued with progress. Pt reports that he is completing his home exercises program at home and has been getting into prone position more often. He reports that he is pleased with his progress and comfortable with D/C to HEP. Educated pt on the importance of continued compliance with HEP to maintain and improve strength and functional independence.  Pt will benefit from skilled therapeutic intervention in order to improve on the following deficits: Decreased range of motion;Increased fascial restricitons;Impaired sensation;Increased muscle spasms;Decreased strength;Decreased coordination PT Plan: Recommend D/C to HEP.    Goals PT Short Term Goals PT Short Term Goal 1: Berg improved by 5  PT Short Term Goal 1 - Progress: Met PT Short Term Goal 2: Pt to be able to come sit to stand with two attempts I PT Short Term Goal 2 - Progress: Met PT Short Term Goal 3: Pt to be able to walk for 10 minutes  PT Short Term Goal 3 - Progress: Met PT Long Term Goals Time to Complete Long Term Goals: 4 weeks PT Long Term Goal 1: Pt Berg to be improved 10 points  PT Long Term Goal 1 - Progress: Met PT Long Term Goal 2: Pt to have had no falls  PT Long Term Goal 2 - Progress: Met Long Term Goal 3: I in advance HEP Long Term Goal 3 Progress: Met Long Term Goal 4: Pt to be walking for 20 minutes  (10-15 minutes) PT Long Term Goal 5: Pt to have only 10 degree of forward bend when standing (3 degrees currently) Long Term Goal 5 Progress: Met Additional PT Long Term Goals?: Yes PT Long Term Goal 6: able to come sit to stand in one attempt. Long Term Goal 6 Progress: Met  Problem List Patient Active Problem List   Diagnosis Date Noted  . Lack of coordination 09/18/2013  . Poor balance 09/11/2013  . Difficulty in walking(719.7) 09/11/2013  . Hx of falling 09/11/2013  . Encounter for driver's license history and physical 08/27/2013  . Routine general medical examination at a  health care facility 02/06/2013  . CVA (cerebral vascular accident) 09/07/2012  . CKD (chronic kidney disease) stage 4, GFR 15-29 ml/min 09/07/2012  . Anemia 09/07/2012  . Cholelithiasis 08/10/2012  . Medically noncompliant 07/04/2012  . Shoulder pain, right 06/28/2012  . HTN (hypertension) 03/15/2012  . Bradycardia 03/15/2012  . Peripheral autonomic neuropathy due to diabetes mellitus 03/14/2012  . Complex partial seizure disorder 03/07/2012  . Right hand weakness 02/04/2012  . Falls frequently 07/13/2011  . DM (diabetes mellitus) type II uncontrolled with renal manifestation 11/26/2010  . FATIGUE 01/01/2010  . UNSTEADY GAIT 11/20/2009  . NUMBNESS 11/20/2009  . HYPOTHYROIDISM 03/09/2008  . HYPERLIPIDEMIA 03/09/2008  . OBESITY 03/09/2008  . HYPERTENSION 03/09/2008  . PERIPHERAL VASCULAR DISEASE 03/09/2008  . DEGENERATIVE JOINT DISEASE, KNEE 01/26/2008  . DERANGEMENT MENISCUS 01/26/2008  . KNEE PAIN 01/26/2008  . SPONDYLOSIS 01/26/2008  . SPINAL STENOSIS 01/26/2008    PT - End of Session Equipment Utilized During Treatment: Gait belt Activity Tolerance: Patient tolerated treatment well General Behavior During  Therapy: WFL for tasks assessed/performed  GP Functional Assessment Tool Used: Merrilee Jansky Functional Limitation: Changing and maintaining body position Changing and Maintaining Body Position Goal Status (O3500): At least 1 percent but less than 20 percent impaired, limited or restricted Changing and Maintaining Body Position Discharge Status [phone]): At least 20 percent but less than 40 percent impaired, limited or restricted  Rachelle Hora, PTA  12/28/2013, 12:33 PM  Physician Documentation Your signature is required to indicate approval of the treatment plan as stated above.  Please sign and either send electronically or make a copy of this report for your files and return this physician signed original.   Please mark one 1.__approve of plan  2. ___approve of plan with  the following conditions.   ______________________________                                                          _____________________ Physician Signature                                                                                                             Date

## 2014-01-01 ENCOUNTER — Ambulatory Visit (HOSPITAL_COMMUNITY): Payer: Medicare Other | Admitting: *Deleted

## 2014-01-19 DIAGNOSIS — E1149 Type 2 diabetes mellitus with other diabetic neurological complication: Secondary | ICD-10-CM | POA: Diagnosis not present

## 2014-01-19 DIAGNOSIS — L259 Unspecified contact dermatitis, unspecified cause: Secondary | ICD-10-CM | POA: Diagnosis not present

## 2014-01-19 DIAGNOSIS — B351 Tinea unguium: Secondary | ICD-10-CM | POA: Diagnosis not present

## 2014-01-19 DIAGNOSIS — L851 Acquired keratosis [keratoderma] palmaris et plantaris: Secondary | ICD-10-CM | POA: Diagnosis not present

## 2014-02-06 ENCOUNTER — Other Ambulatory Visit: Payer: Self-pay | Admitting: Family Medicine

## 2014-02-07 ENCOUNTER — Ambulatory Visit (INDEPENDENT_AMBULATORY_CARE_PROVIDER_SITE_OTHER): Payer: Medicare Other | Admitting: Family Medicine

## 2014-02-07 ENCOUNTER — Encounter (INDEPENDENT_AMBULATORY_CARE_PROVIDER_SITE_OTHER): Payer: Self-pay

## 2014-02-07 ENCOUNTER — Encounter: Payer: Self-pay | Admitting: Family Medicine

## 2014-02-07 VITALS — BP 140/78 | HR 76 | Resp 16 | Wt 188.0 lb

## 2014-02-07 DIAGNOSIS — Z0000 Encounter for general adult medical examination without abnormal findings: Secondary | ICD-10-CM | POA: Diagnosis not present

## 2014-02-07 NOTE — Progress Notes (Signed)
Subjective:    Patient ID: Chase [name], male    DOB: Apr 04, 1931, 80 y.o.   MRN: [id number]  HPI Preventive Screening-Counseling & Management   Patient present here today for a Medicare annual wellness visit.   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 No  Over the past two weeks, have you felt little interest or pleasure in doing things? No  Have you lost interest or pleasure in daily life? No  Do you often feel hopeless? No  Do you cry easily over simple problems? No   Activities of Daily Living  In your present state of health, do you have any difficulty performing the following activities?  Driving?: yes Managing money?: does this with wife Feeding yourself?:No Getting from bed to chair?:yes Climbing a flight of stairs?:yes Preparing food and eating?:no Bathing or showering?:yes, need help Getting dressed?:yes, needs help Getting to the toilet?:No Using the toilet?:No Moving around from place to place?:yes, uses a cane  Fall Risk Assessment In the past year have you fallen or had a near fall?:yes, chronic imbalance due to numbness in feet and unsteady gait Are you currently taking any medications that make you dizzy?:No   Hearing Difficulties: No Do you often ask people to speak up or repeat themselves?:No Do you experience ringing or noises in your ears?:No Do you have difficulty understanding soft or  whispered voices?:yes  Cognitive Testing  Alert? Yes Normal Appearance?Yes  Oriented to person? Yes Place? Yes  Time? Yes  Displays appropriate judgment?Yes  Can read the correct time from a watch face? yes Are you having problems remembering things?yes at times , memory for recent events is lost  Advanced Directives have been discussed with the patient?Yes , full code   List the Names of Other Physician/Practitioners you currently use: Dr Dorris Fetch, Dr Radford Pax, Dr Percival Spanish any recent Medical Services you may have received from other than [facility] providers in the past year (date may be approximate).   Assessment:    Annual Wellness Exam   Plan:    During the course of the visit the patient was educated and counseled about appropriate screening and preventive services including:  A healthy diet is rich in fruit, vegetables and whole grains. Poultry fish, nuts and beans are a healthy choice for protein rather then red meat. A low sodium diet and drinking 64 ounces of water daily is generally recommended. Oils and sweet should be limited. Carbohydrates especially for those who are diabetic or overweight, should be limited to 30-45 gram per meal. It is important to eat on a regular schedule, at least 3 times daily. Snacks should be primarily fruits, vegetables or nuts. It is important that you exercise regularly at least 30 minutes 5 times a week. If you develop chest pain, have severe difficulty breathing, or feel very tired, stop exercising immediately and seek medical attention  Immunization reviewed and updated. Cancer screening reviewed and updated  Patient Instructions (the written plan) was given to the patient.  Medicare Attestation  I have personally reviewed:  The patient's medical and social history  Their use of alcohol, tobacco or illicit drugs  Their current medications and supplements  The patient's functional ability including ADLs,fall risks, home safety risks,  cognitive, and hearing and visual impairment  Diet and physical activities  Evidence for depression or mood disorders  The patient's weight, height, BMI, and visual acuity have been recorded in the chart. I have made referrals, counseling, and provided education to the patient based on review of the above and I have provided the patient with a written personalized care plan for preventive services.      Review of Systems     Objective:   Physical Exam        Assessment & Plan:  Routine general medical examination at a health care facility Annual wellness as documented Pt has significant physical and mental  Limitations, he is incapable of adequately caring for himself. He has the support of his wife , who at times becomes overwhelmed, as well as of his children. He is a high fall risk has had Pt in the recent past with some improvement. Safety issues are reviewed. Advanced directives are discussed in presence of wife, he is a full code

## 2014-02-07 NOTE — Patient Instructions (Signed)
F/u in 4 months, with rectal exam  Please work on moving around some more   Please work on vision

## 2014-02-16 DIAGNOSIS — E039 Hypothyroidism, unspecified: Secondary | ICD-10-CM | POA: Diagnosis not present

## 2014-02-16 DIAGNOSIS — E1165 Type 2 diabetes mellitus with hyperglycemia: Secondary | ICD-10-CM | POA: Diagnosis not present

## 2014-02-16 DIAGNOSIS — E785 Hyperlipidemia, unspecified: Secondary | ICD-10-CM | POA: Diagnosis not present

## 2014-02-16 DIAGNOSIS — E1129 Type 2 diabetes mellitus with other diabetic kidney complication: Secondary | ICD-10-CM | POA: Diagnosis not present

## 2014-02-16 DIAGNOSIS — E559 Vitamin D deficiency, unspecified: Secondary | ICD-10-CM | POA: Diagnosis not present

## 2014-02-16 DIAGNOSIS — I10 Essential (primary) hypertension: Secondary | ICD-10-CM | POA: Diagnosis not present

## 2014-02-22 ENCOUNTER — Telehealth: Payer: Self-pay | Admitting: Family Medicine

## 2014-02-22 NOTE — Telephone Encounter (Signed)
Spoke with wife regarding diabetic form

## 2014-02-23 DIAGNOSIS — E669 Obesity, unspecified: Secondary | ICD-10-CM | POA: Diagnosis not present

## 2014-02-23 DIAGNOSIS — E1165 Type 2 diabetes mellitus with hyperglycemia: Secondary | ICD-10-CM | POA: Diagnosis not present

## 2014-02-23 DIAGNOSIS — E1129 Type 2 diabetes mellitus with other diabetic kidney complication: Secondary | ICD-10-CM | POA: Diagnosis not present

## 2014-02-23 DIAGNOSIS — I10 Essential (primary) hypertension: Secondary | ICD-10-CM | POA: Diagnosis not present

## 2014-02-23 DIAGNOSIS — E559 Vitamin D deficiency, unspecified: Secondary | ICD-10-CM | POA: Diagnosis not present

## 2014-02-23 DIAGNOSIS — E039 Hypothyroidism, unspecified: Secondary | ICD-10-CM | POA: Diagnosis not present

## 2014-02-23 DIAGNOSIS — E785 Hyperlipidemia, unspecified: Secondary | ICD-10-CM | POA: Diagnosis not present

## 2014-02-24 DIAGNOSIS — E1129 Type 2 diabetes mellitus with other diabetic kidney complication: Secondary | ICD-10-CM | POA: Diagnosis not present

## 2014-02-24 DIAGNOSIS — R809 Proteinuria, unspecified: Secondary | ICD-10-CM | POA: Diagnosis not present

## 2014-02-24 DIAGNOSIS — I10 Essential (primary) hypertension: Secondary | ICD-10-CM | POA: Diagnosis not present

## 2014-02-24 DIAGNOSIS — N183 Chronic kidney disease, stage 3 unspecified: Secondary | ICD-10-CM | POA: Diagnosis not present

## 2014-02-26 ENCOUNTER — Telehealth: Payer: Self-pay | Admitting: Family Medicine

## 2014-02-27 MED ORDER — CLOPIDOGREL BISULFATE 75 MG PO TABS: ORAL_TABLET | ORAL | Status: DC

## 2014-02-27 NOTE — Telephone Encounter (Signed)
Spoke with wife and it is still on his medlist and was last sent in Oct with 4 refills but patient does not have the bottle. Refilled for patient

## 2014-03-05 ENCOUNTER — Other Ambulatory Visit (HOSPITAL_COMMUNITY): Payer: Self-pay | Admitting: Nephrology

## 2014-03-05 DIAGNOSIS — N289 Disorder of kidney and ureter, unspecified: Secondary | ICD-10-CM

## 2014-03-06 ENCOUNTER — Telehealth: Payer: Self-pay | Admitting: Family Medicine

## 2014-03-06 NOTE — Telephone Encounter (Signed)
This has been initialed and dated and faxed back

## 2014-03-20 ENCOUNTER — Ambulatory Visit (HOSPITAL_COMMUNITY)
Admission: RE | Admit: 2014-03-20 | Discharge: 2014-03-20 | Disposition: A | Discharge status: Home / Self Care | Payer: Medicare Other | Source: Ambulatory Visit | Attending: Nephrology | Admitting: Nephrology

## 2014-03-20 DIAGNOSIS — Z79899 Other long term (current) drug therapy: Secondary | ICD-10-CM | POA: Diagnosis not present

## 2014-03-20 DIAGNOSIS — I10 Essential (primary) hypertension: Secondary | ICD-10-CM | POA: Diagnosis not present

## 2014-03-20 DIAGNOSIS — N289 Disorder of kidney and ureter, unspecified: Secondary | ICD-10-CM

## 2014-03-20 DIAGNOSIS — R350 Frequency of micturition: Secondary | ICD-10-CM | POA: Diagnosis not present

## 2014-03-20 DIAGNOSIS — N189 Chronic kidney disease, unspecified: Secondary | ICD-10-CM | POA: Diagnosis not present

## 2014-03-20 DIAGNOSIS — R809 Proteinuria, unspecified: Secondary | ICD-10-CM | POA: Diagnosis not present

## 2014-03-20 DIAGNOSIS — D649 Anemia, unspecified: Secondary | ICD-10-CM | POA: Diagnosis not present

## 2014-03-20 DIAGNOSIS — E559 Vitamin D deficiency, unspecified: Secondary | ICD-10-CM | POA: Diagnosis not present

## 2014-03-20 DIAGNOSIS — Z130 Encounter for screening for diseases of the blood and blood-forming organs and certain disorders involving the immune mechanism: Secondary | ICD-10-CM | POA: Diagnosis not present

## 2014-03-26 DIAGNOSIS — E109 Type 1 diabetes mellitus without complications: Secondary | ICD-10-CM | POA: Diagnosis not present

## 2014-03-26 LAB — HM DIABETES EYE EXAM

## 2014-03-30 ENCOUNTER — Other Ambulatory Visit: Payer: Self-pay | Admitting: Family Medicine

## 2014-03-30 DIAGNOSIS — E1149 Type 2 diabetes mellitus with other diabetic neurological complication: Secondary | ICD-10-CM | POA: Diagnosis not present

## 2014-03-30 DIAGNOSIS — L851 Acquired keratosis [keratoderma] palmaris et plantaris: Secondary | ICD-10-CM | POA: Diagnosis not present

## 2014-03-30 DIAGNOSIS — B351 Tinea unguium: Secondary | ICD-10-CM | POA: Diagnosis not present

## 2014-04-01 ENCOUNTER — Other Ambulatory Visit: Payer: Self-pay | Admitting: Family Medicine

## 2014-04-01 DIAGNOSIS — R69 Illness, unspecified: Secondary | ICD-10-CM | POA: Diagnosis not present

## 2014-04-04 DIAGNOSIS — D649 Anemia, unspecified: Secondary | ICD-10-CM | POA: Diagnosis not present

## 2014-04-04 DIAGNOSIS — N184 Chronic kidney disease, stage 4 (severe): Secondary | ICD-10-CM | POA: Diagnosis not present

## 2014-04-04 DIAGNOSIS — E559 Vitamin D deficiency, unspecified: Secondary | ICD-10-CM | POA: Diagnosis not present

## 2014-04-04 DIAGNOSIS — N2581 Secondary hyperparathyroidism of renal origin: Secondary | ICD-10-CM | POA: Diagnosis not present

## 2014-04-04 DIAGNOSIS — I10 Essential (primary) hypertension: Secondary | ICD-10-CM | POA: Diagnosis not present

## 2014-04-10 ENCOUNTER — Ambulatory Visit (INDEPENDENT_AMBULATORY_CARE_PROVIDER_SITE_OTHER): Payer: Medicare Other | Admitting: Cardiovascular Disease

## 2014-04-10 ENCOUNTER — Encounter: Payer: Self-pay | Admitting: Cardiovascular Disease

## 2014-04-10 VITALS — BP 166/72 | HR 74 | Ht 68.0 in | Wt 192.0 lb

## 2014-04-10 DIAGNOSIS — H34219 Partial retinal artery occlusion, unspecified eye: Secondary | ICD-10-CM

## 2014-04-10 DIAGNOSIS — E7800 Pure hypercholesterolemia, unspecified: Secondary | ICD-10-CM | POA: Diagnosis not present

## 2014-04-10 DIAGNOSIS — I10 Essential (primary) hypertension: Secondary | ICD-10-CM

## 2014-04-10 DIAGNOSIS — I6529 Occlusion and stenosis of unspecified carotid artery: Secondary | ICD-10-CM | POA: Diagnosis not present

## 2014-04-10 DIAGNOSIS — I658 Occlusion and stenosis of other precerebral arteries: Secondary | ICD-10-CM | POA: Diagnosis not present

## 2014-04-10 DIAGNOSIS — I6523 Occlusion and stenosis of bilateral carotid arteries: Secondary | ICD-10-CM

## 2014-04-10 DIAGNOSIS — N189 Chronic kidney disease, unspecified: Secondary | ICD-10-CM

## 2014-04-10 DIAGNOSIS — H34212 Partial retinal artery occlusion, left eye: Secondary | ICD-10-CM

## 2014-04-10 MED ORDER — HYDRALAZINE HCL 25 MG PO TABS: 25.0000 mg | ORAL_TABLET | Freq: Two times a day (BID) | ORAL | Status: DC

## 2014-04-10 NOTE — Progress Notes (Signed)
Patient ID: Chase [name], male   DOB: [birth year], 80 y.o.   MRN: [id number]      SUBJECTIVE: Chase [name] is an 80 yr old man with an extensive PMH which includes CVA, CKD, hypothyroidism, HTN, hypercholesterolemia, and IDDM. His most recent echocardiogram performed on 09-08-2012 revealed the following:  - Left ventricle: The cavity size was normal. There was moderate to severe concentric hypertrophy. Systolic function was vigorous. The estimated ejection fraction was in the range of 65% to 70%. Wall motion was normal. - Aortic valve: Mildly to moderately calcified annulus. Mildly thickened leaflets. - Mitral valve: Calcified annulus. - Left atrium: The atrium was mildly to moderately dilated. - Right ventricle: The cavity size was normal. Wall thickness was increased. - Right atrium: The atrium was mildly dilated. - Pericardium, extracardiac: A trivial pericardial effusion was identified posterior to the heart.  Carotid Dopplers in October 2014 demonstrated the following: Minimal plaque formation is noted in the proximal right internal carotid artery consistent with less than 50% diameter stenosis based on Doppler criteria. Focal moderate size eccentric plaque is noted in the left carotid bulb with mild plaque formation in the proximal left internal carotid artery consistent with the lower end of the 50-69% stenosis range based on Doppler criteria.   He currently denies chest pain and shortness of breath. He has had some mild leg and ankle swelling. His wife said that he eats a lot of salt and adds salt to most of his foods and has been very noncompliant with this.  Allergies  Allergen Reactions  . Bayer Advanced Aspirin [Aspirin] Nausea And Vomiting  . Penicillins Nausea And Vomiting    Current Outpatient Prescriptions  Medication Sig Dispense Refill  . amLODipine (NORVASC) 10 MG tablet TAKE ONE TABLET BY MOUTH ONCE DAILY  30 tablet  4  . aspirin EC 81 MG tablet Take 1 tablet  (81 mg total) by mouth daily.  150 tablet  2  . chlorthalidone (HYGROTON) 25 MG tablet TAKE ONE TABLET BY MOUTH ONCE DAILY  30 tablet  6  . clobetasol cream (TEMOVATE) 0.05 %       . clopidogrel (PLAVIX) 75 MG tablet TAKE ONE TABLET BY MOUTH IN THE MORNING WITH BREAKFAST  30 tablet  4  . fluticasone (CUTIVATE) 0.05 % cream Apply 1 application topically as needed.      . hydrALAZINE (APRESOLINE) 25 MG tablet TAKE ONE TABLET BY MOUTH ONCE DAILY*REDUCE DOSE AS OF [id number]*  30 tablet  4  . insulin lispro protamine-insulin lispro (HUMALOG 75/25) (75-25) 100 UNIT/ML SUSP Inject 15 Units into the skin 2 (two) times daily with a meal.      . levETIRAcetam (KEPPRA) 500 MG tablet Take 250 mg by mouth at bedtime. Take ONE_HALF TAB BY MOUTH AT BEDTIME      . levothyroxine (SYNTHROID, LEVOTHROID) 150 MCG tablet TAKE ONE TABLET BY MOUTH EVERY DAY  30 tablet  2  . lovastatin (MEVACOR) 40 MG tablet TAKE ONE TABLET BY MOUTH AT BEDTIME  30 tablet  4   No current facility-administered medications for this visit.    Past Medical History  Diagnosis Date  . Peripheral vascular disease, unspecified   . Depressive disorder, not elsewhere classified   . Obesity   . Other and unspecified hyperlipidemia   . Pain in joint, lower leg   . Osteoarthrosis, unspecified whether generalized or localized, lower leg   . Derangement of meniscus, not elsewhere classified   . Lumbago   . Spondylosis of  unspecified site without mention of myelopathy   . Spinal stenosis, unspecified region other than cervical   . Backache, unspecified   . Family history of diabetes mellitus   . Seizures   . Complete lesion of cervical spinal cord 03/14/3012    Stable since 2006  . Lacunar stroke, acute 03/14/2012  . Bradycardia 03/15/2012  . Gait disorder   . Knee contracture   . Diabetic neuropathy   . CKD (chronic kidney disease) stage 3, GFR 30-59 ml/min   . CKD (chronic kidney disease) stage 4, GFR 15-29 ml/min   . Diabetes mellitus  approx 1994  . Hypothyroidism approx 2000  . Unspecified hypothyroidism   . Hypertension approx 1994  . Unspecified essential hypertension   . CVA (cerebrovascular accident) 09/07/12    Past Surgical History  Procedure Laterality Date  . Kidney stones left x2  1975  . Kidney surgery      Ruptured left kidney 30 yrs ago  from a kidney stone  . Colonoscopy N/A 08/10/2013    Procedure: COLONOSCOPY;  Surgeon: Rogene Houston, MD;  Location: [facility];  Service: Endoscopy;  Laterality: N/A;  240    History   Social History  . Marital Status: Married    Spouse Name: N/A    Number of Children: 67  . Years of Education: N/A   Occupational History  . retired     Social History Main Topics  . Smoking status: Never Smoker   . Smokeless tobacco: Not on file  . Alcohol Use: No  . Drug Use: No  . Sexual Activity: No   Other Topics Concern  . Not on file   Social History Narrative  . No narrative on file     Filed Vitals:   04/10/14 1048  BP: 166/72  Pulse: 74  Height: 5\' 8"  (1.727 m)  Weight: 192 lb (87.091 kg)    PHYSICAL EXAM General: NAD Neck: No JVD, no thyromegaly. Lungs: Clear to auscultation bilaterally with normal respiratory effort. CV: Nondisplaced PMI.  Regular rate and rhythm, normal S1/S2, no S3/S4, no murmur. Trace pretibial and periankle edema b/l.    Abdomen: Soft, nontender, no hepatosplenomegaly, no distention.  Neurologic: Alert and oriented x 3.  Psych: Normal affect. Extremities: No clubbing or cyanosis.   ECG: reviewed and available in electronic records.      ASSESSMENT AND PLAN: 1. Essential HTN: Uncontrolled today. I have advised him on the importance of a low sodium diet, and will provide him with literature regarding this. I will increase hydralazine to 25 mg twice daily, and continue chlorthalidone 25 mg daily with close renal function monitoring, given his h/o CKD. Given the degree of left ventricular hypertrophy and moderate  hypertensive retinopathy, I suspect his HTN is chronic and suboptimally controlled. Continue amlodipine 10 mg daily.  2. Left retinal arteriole cholesterol embolus: I will continue ASA 81 mg daily, in addition to his Plavix. Carotid Doppler results as noted above. 3. Hypercholesterolemia: Continue lovastatin 40 mg daily for now.  4. CKD: continue to monitor. 5. Carotid artery stenosis: I will make a referral to vascular surgery so that he can obtain routine monitoring for this.  Dispo: f/u 6 months.  Kate Sable, M.D., F.A.C.C.

## 2014-04-10 NOTE — Patient Instructions (Addendum)
Your physician wants you to follow-up in: 6 months You will receive a reminder letter in the mail two months in advance. If you don't receive a letter, please call our office to schedule the follow-up appointment.    Your physician has recommended you make the following change in your medication:   INCREASE Hydralazine to 25 mg twice a day    Please read low sodium diet pamphlet I have provided you. Remove the salt shaker from your dinner table   You have been referred to vascular surgery.You will receive a phone call from their office to schedule an appointment   Thank you for choosing [facility] !

## 2014-04-30 NOTE — Assessment & Plan Note (Addendum)
Annual wellness as documented Pt has significant physical and mental  Limitations, he is incapable of adequately caring for himself. He has the support of his wife , who at times becomes overwhelmed, as well as of his children. He is a high fall risk has had Pt in the recent past with some improvement. Safety issues are reviewed. Advanced directives are discussed in presence of wife, he is a full code

## 2014-05-15 ENCOUNTER — Ambulatory Visit (INDEPENDENT_AMBULATORY_CARE_PROVIDER_SITE_OTHER): Payer: 59 | Admitting: Urology

## 2014-05-15 DIAGNOSIS — N138 Other obstructive and reflux uropathy: Secondary | ICD-10-CM

## 2014-05-15 DIAGNOSIS — N401 Enlarged prostate with lower urinary tract symptoms: Secondary | ICD-10-CM

## 2014-05-15 DIAGNOSIS — R351 Nocturia: Secondary | ICD-10-CM | POA: Diagnosis not present

## 2014-05-15 DIAGNOSIS — R350 Frequency of micturition: Secondary | ICD-10-CM | POA: Diagnosis not present

## 2014-05-17 ENCOUNTER — Encounter: Payer: Self-pay | Admitting: Vascular Surgery

## 2014-05-18 ENCOUNTER — Ambulatory Visit (INDEPENDENT_AMBULATORY_CARE_PROVIDER_SITE_OTHER): Payer: 59 | Admitting: Vascular Surgery

## 2014-05-18 ENCOUNTER — Encounter: Payer: Self-pay | Admitting: Vascular Surgery

## 2014-05-18 VITALS — BP 142/59 | HR 75 | Resp 16 | Ht 68.0 in | Wt 187.0 lb

## 2014-05-18 DIAGNOSIS — I739 Peripheral vascular disease, unspecified: Secondary | ICD-10-CM

## 2014-05-18 DIAGNOSIS — I639 Cerebral infarction, unspecified: Secondary | ICD-10-CM

## 2014-05-18 DIAGNOSIS — I779 Disorder of arteries and arterioles, unspecified: Secondary | ICD-10-CM | POA: Insufficient documentation

## 2014-05-18 DIAGNOSIS — I6529 Occlusion and stenosis of unspecified carotid artery: Secondary | ICD-10-CM

## 2014-05-18 DIAGNOSIS — I6350 Cerebral infarction due to unspecified occlusion or stenosis of unspecified cerebral artery: Secondary | ICD-10-CM | POA: Diagnosis not present

## 2014-05-18 NOTE — Addendum Note (Signed)
Addended by: Mena Goes on: 05/18/2014 01:37 PM   Modules accepted: Orders

## 2014-05-18 NOTE — Progress Notes (Signed)
New Carotid Patient  Referred by:  [name], MD [address], [place], [place] [address]  Reason for referral: B carotid stenosis  History of Present Illness  Chase [name] is a 80 y.o. (Oct 27, 1931) male who presents with chief complaint: difficulty seeing.  Pt has previously been diagnosed with a CVA in 2013.  He was "acting strange" at that time.  A Head MRI at that time confirms a L MCA acute infarct.  Previous carotid studies demonstrated: RICA Q000111Q stenosis, LICA 0000000 stenosis.  Patient has no recent TIA or stroke symptoms.  The patient has never had amaurosis fugax or monocular blindness.  The patient has never had facial drooping or hemiplegia.  The patient has never had receptive or expressive aphasia.   Even prior to the CVA the patient has had R arm weak felt due to musculoskeletal reason.  The patient's risks factors for carotid disease include: DM, HTN, HLD.  Due to the patient's musculoskeletal problems, his walking is limited.  He develops periodically swelling and ulcers in his legs L>>R.  He denies any prior DVT.  He denies any gangrene or rest pain.  He is under the care of Dr. Nils Pyle for his current L medial VSU.  Additionally, pt is being evaluated for B cataracts.  Past Medical History  Diagnosis Date  . Peripheral vascular disease, unspecified   . Depressive disorder, not elsewhere classified   . Obesity   . Other and unspecified hyperlipidemia   . Pain in joint, lower leg   . Osteoarthrosis, unspecified whether generalized or localized, lower leg   . Derangement of meniscus, not elsewhere classified   . Lumbago   . Spondylosis of unspecified site without mention of myelopathy   . Spinal stenosis, unspecified region other than cervical   . Backache, unspecified   . Family history of diabetes mellitus   . Seizures   . Complete lesion of cervical spinal cord 03/14/3012    Stable since 2006  . Lacunar stroke, acute 03/14/2012  . Bradycardia  03/15/2012  . Gait disorder   . Knee contracture   . Diabetic neuropathy   . CKD (chronic kidney disease) stage 3, GFR 30-59 ml/min   . CKD (chronic kidney disease) stage 4, GFR 15-29 ml/min   . Diabetes mellitus approx 1994  . Hypothyroidism approx 2000  . Unspecified hypothyroidism   . Hypertension approx 1994  . Unspecified essential hypertension   . CVA (cerebrovascular accident) 09/07/12    Past Surgical History  Procedure Laterality Date  . Kidney stones left x2  1975  . Kidney surgery      Ruptured left kidney 30 yrs ago  from a kidney stone  . Colonoscopy N/A 08/10/2013    Procedure: COLONOSCOPY;  Surgeon: Rogene Houston, MD;  Location: [facility];  Service: Endoscopy;  Laterality: N/A;  240    History   Social History  . Marital Status: Married    Spouse Name: N/A    Number of Children: 44  . Years of Education: N/A   Occupational History  . retired     Social History Main Topics  . Smoking status: Never Smoker   . Smokeless tobacco: Never Used  . Alcohol Use: No  . Drug Use: No  . Sexual Activity: No   Other Topics Concern  . Not on file   Social History Narrative  . No narrative on file    Family History  Problem Relation Age of Onset  .  Diabetes Mother   . Prostate cancer Father   . Diabetes Brother   . Diabetes Brother   . Hypertension Brother   . Hypertension Brother   . Hypertension Brother    Current Outpatient Prescriptions on File Prior to Visit  Medication Sig Dispense Refill  . amLODipine (NORVASC) 10 MG tablet TAKE ONE TABLET BY MOUTH ONCE DAILY  30 tablet  4  . aspirin EC 81 MG tablet Take 1 tablet (81 mg total) by mouth daily.  150 tablet  2  . chlorthalidone (HYGROTON) 25 MG tablet TAKE ONE TABLET BY MOUTH ONCE DAILY  30 tablet  6  . clobetasol cream (TEMOVATE) 0.05 %       . clopidogrel (PLAVIX) 75 MG tablet TAKE ONE TABLET BY MOUTH IN THE MORNING WITH BREAKFAST  30 tablet  4  . fluticasone (CUTIVATE) 0.05 % cream Apply 1  application topically as needed.      . hydrALAZINE (APRESOLINE) 25 MG tablet Take 1 tablet (25 mg total) by mouth 2 (two) times daily.  270 tablet  3  . insulin lispro protamine-insulin lispro (HUMALOG 75/25) (75-25) 100 UNIT/ML SUSP Inject 15 Units into the skin 2 (two) times daily with a meal.      . levETIRAcetam (KEPPRA) 500 MG tablet Take 250 mg by mouth at bedtime. Take ONE_HALF TAB BY MOUTH AT BEDTIME      . levothyroxine (SYNTHROID, LEVOTHROID) 150 MCG tablet TAKE ONE TABLET BY MOUTH EVERY DAY  30 tablet  2  . lovastatin (MEVACOR) 40 MG tablet TAKE ONE TABLET BY MOUTH AT BEDTIME  30 tablet  4   No current facility-administered medications on file prior to visit.    Allergies  Allergen Reactions  . Bayer Advanced Aspirin [Aspirin] Nausea And Vomiting  . Penicillins Nausea And Vomiting    REVIEW OF SYSTEMS:  (Positives checked otherwise negative)  CARDIOVASCULAR:  []  chest pain, []  chest pressure, []  palpitations, []  shortness of breath when laying flat, []  shortness of breath with exertion,  []  pain in feet when walking, []  pain in feet when laying flat, []  history of blood clot in veins (DVT), []  history of phlebitis, [x]  swelling in legs, []  varicose veins  PULMONARY:  []  productive cough, []  asthma, []  wheezing  NEUROLOGIC:  [x]  weakness in arms or legs, [x]  numbness in arms or legs, []  difficulty speaking or slurred speech, []  temporary loss of vision in one eye, []  dizziness  HEMATOLOGIC:  []  bleeding problems, []  problems with blood clotting too easily  MUSCULOSKEL:  []  joint pain, []  joint swelling  GASTROINTEST:  []  vomiting blood, []  blood in stool     GENITOURINARY:  []  burning with urination, []  blood in urine  PSYCHIATRIC:  []  history of major depression  INTEGUMENTARY:  []  rashes, [x]  ulcers  CONSTITUTIONAL:  []  fever, [x]  chills  For VQI Use Only  PRE-ADM LIVING: Home  AMB STATUS: Ambulatory  CAD Sx: None  PRIOR CHF: None  STRESS TEST: [x]  No, [ ]   Normal, [ ]  + ischemia, [ ]  + MI, [ ]  Both  Physical Examination  Filed Vitals:   05/18/14 1014 05/18/14 1015  BP: 149/63 142/59  Pulse: 84 75  Resp: 16   Height: 5\' 8"  (1.727 m)   Weight: 187 lb (84.823 kg)    Body mass index is 28.44 kg/(m^2).  General: A&O x 3, WD, elderly  Head: /AT  Ear/Nose/Throat: Hearing grossly intact, nares w/o erythema or drainage, oropharynx w/o Erythema/Exudate, Mallampati score: 3  Eyes: Pupils 2 mm bilateral with limited response to light, EOMI, B arcus senilus  Neck: Supple, no nuchal rigidity, no palpable LAD  Pulmonary: Sym exp, good air movt, CTAB, no rales, rhonchi, & wheezing  Cardiac: RRR, Nl S1, S2, no Murmurs, rubs or gallops  Vascular: Vessel Right Left  Radial Faintly Palpable Palpable  Brachial Faintly Palpable Palpable  Carotid Palpable, without bruit Palpable, without bruit  Aorta  Not palpable N/A  Femoral Palpable Palpable  Popliteal Not palpable Not palpable  PT Palpable Palpable  DP Palpable Palpable   Gastrointestinal: soft, NTND, -G/R, - HSM, - masses, - CVAT B, aorta not palpable due to pannus  Musculoskeletal: M/S 5/5 throughout except RUE, AROM is limited by pain, Extremities without ischemic changes except L medial ulcer c/w active VSU with evidence prior healed VSU: good granulation, B edema 1+  Neurologic: CN 2-12 intact , Pain and light touch intact in extremities , Motor exam as listed above  Psychiatric: Judgment intact, Mood & affect appropriate for pt's clinical situation  Dermatologic: See M/S exam for extremity exam, no rashes otherwise noted  Lymph : No Cervical, Axillary, or Inguinal lymphadenopathy   Outside Studies/Documentation 4  pages of outside documents were reviewed including: outside PCP chart.  Medical Decision Making  Chase [name] is a 80 y.o. male who presents with: current asx LICA stenosis 0000000, asx R ICA stenosis <50%, healing L venous stasis ulcer, CVI (C6), likely  peripheral arterial disease    I would repeat the B carotid studies as >6 months has elapsed.  I would also get BLE ABI to screen for PAD.  He will follow up after these studies are obtained in 2-4 weeks.  I generally do not pursue CEA for sx stenosis <70%, as this population gets the best benefit from CEA.  Venous insufficiency studies will need to be obtain at some point, but not needed at this point.  I discussed in depth with the patient the nature of atherosclerosis, and emphasized the importance of maximal medical management including strict control of blood pressure, blood glucose, and lipid levels, obtaining regular exercise,  antiplatelet agents, and cessation of smoking.   The patient is currently on a statin: Mevacor.  The patient is currently on an anti-platelet: ASA, Plavix.  The patient is aware that without maximal medical management the underlying atherosclerotic disease process will progress, limiting the benefit of any interventions.  Thank you for allowing [place] to participate in this patient's care.  Adele Barthel, MD [facility] Office: [phone] Pager: [phone]  05/18/2014, 11:38 AM

## 2014-05-29 DIAGNOSIS — E039 Hypothyroidism, unspecified: Secondary | ICD-10-CM | POA: Diagnosis not present

## 2014-05-29 DIAGNOSIS — E785 Hyperlipidemia, unspecified: Secondary | ICD-10-CM | POA: Diagnosis not present

## 2014-05-29 DIAGNOSIS — IMO0001 Reserved for inherently not codable concepts without codable children: Secondary | ICD-10-CM | POA: Diagnosis not present

## 2014-05-29 DIAGNOSIS — I10 Essential (primary) hypertension: Secondary | ICD-10-CM | POA: Diagnosis not present

## 2014-05-31 ENCOUNTER — Encounter: Payer: Self-pay | Admitting: Vascular Surgery

## 2014-06-01 ENCOUNTER — Ambulatory Visit (HOSPITAL_COMMUNITY)
Admission: RE | Admit: 2014-06-01 | Discharge: 2014-06-01 | Disposition: A | Discharge status: Home / Self Care | Payer: Medicare Other | Source: Ambulatory Visit | Attending: Vascular Surgery | Admitting: Vascular Surgery

## 2014-06-01 ENCOUNTER — Ambulatory Visit (INDEPENDENT_AMBULATORY_CARE_PROVIDER_SITE_OTHER): Payer: 59 | Admitting: Vascular Surgery

## 2014-06-01 ENCOUNTER — Ambulatory Visit (INDEPENDENT_AMBULATORY_CARE_PROVIDER_SITE_OTHER)
Admission: RE | Admit: 2014-06-01 | Discharge: 2014-06-01 | Disposition: A | Discharge status: Home / Self Care | Payer: Medicare Other | Source: Ambulatory Visit | Attending: Vascular Surgery | Admitting: Vascular Surgery

## 2014-06-01 ENCOUNTER — Encounter: Payer: Self-pay | Admitting: Vascular Surgery

## 2014-06-01 VITALS — BP 168/81 | HR 64 | Resp 16 | Ht 68.0 in | Wt 193.0 lb

## 2014-06-01 DIAGNOSIS — I6529 Occlusion and stenosis of unspecified carotid artery: Secondary | ICD-10-CM | POA: Insufficient documentation

## 2014-06-01 DIAGNOSIS — M79609 Pain in unspecified limb: Secondary | ICD-10-CM

## 2014-06-01 DIAGNOSIS — I739 Peripheral vascular disease, unspecified: Secondary | ICD-10-CM

## 2014-06-01 DIAGNOSIS — M79669 Pain in unspecified lower leg: Secondary | ICD-10-CM

## 2014-06-01 NOTE — Progress Notes (Signed)
Established Carotid Patient  History of Present Illness  Chase [name] is a 80 y.o. ([birth year]) male who presents with chief complaint: carotid stenosis.  Previous carotid studies demonstrated: RICA Q000111Q stenosis, LICA 0000000 stenosis.  Patient has a history of CVA in 2013. A head MRI confirmed a L MCA infarct.  The patient has never had amaurosis fugax or monocular blindness.  The patient has never had facial drooping or hemiplegia.  The patient has never had receptive or expressive aphasia.    He also complains of bilateral lower extremity swelling and an ulcer of the left foot. It has now healed. He denies any prior DVT, intermittent claudication, rest pain or gangrene.   Physical Examination  Filed Vitals:   06/01/14 1558  BP: 168/81  Pulse: 64  Resp: 16  Height: 5\' 8"  (1.727 m)  Weight: 193 lb (87.544 kg)   Body mass index is 29.35 kg/(m^2).  General: A&O x 3, WDWN male in NAD  Head: [place]/AT   Pulmonary: Sym exp, good air movt, CTAB, no rales, rhonchi, & wheezing   Cardiac: RRR, Nl S1, S2, no Murmurs, rubs or gallops   Vascular:  Vessel  Right  Left   Radial  Faintly Palpable  Palpable   Brachial  Faintly Palpable  Palpable   Carotid  Palpable, without bruit  Palpable, without bruit   Aorta  Not palpable  N/A   Femoral  Palpable  Palpable   Popliteal  Not palpable  Not palpable   PT  Palpable  Palpable   DP  Palpable  Palpable    Gastrointestinal: soft, NTND, -G/R, - HSM, - masses, - CVAT B, aorta not palpable due to pannus   Musculoskeletal: M/S 5/5 throughout except RUE, AROM is limited by pain, Extremities without ischemic changes except L medial ulcer c/w active VSU with evidence prior healed VSU: good granulation, B edema 1+   Neurologic: CN 2-12 intact , Pain and light touch intact in extremities , Motor exam as listed above   Non-Invasive Vascular Imaging  Carotid Duplex (Date: 06/01/2014):   R ICA stenosis: <40%  R VA: patent and antegrade  L ICA  stenosis: <40%  L VA: patent and antegrade  ABI (Date: 06/01/2014)  RLE: 1.26, AT and PT: triphasic  LLE: 1.29, AT: bi, PT: tri  Medical Decision Making  Chase [name] is a 80 y.o. male who presents with: asx R ICA stenosis, sx <40% stenosis L ICA, no evidence of BLE PAD, CVI (C5): healed VSU   Based on the patient's vascular studies and examination, the patient requires no surgical intervention at this time. He has <40% stenosis of his left ICA and no significant stenosis of the right ICA. Will continue to monitor with carotid duplex.  Recommended follow-up in 6 months for venous reflux studies due to bilateral leg swelling and venous stasis changes. Recommended use of OTC compression stockings.   I discussed in depth with the patient the nature of atherosclerosis, and emphasized the importance of maximal medical management including strict control of blood pressure, blood glucose, and lipid levels, antiplatelet agents, obtaining regular exercise, and cessation of smoking.    The patient is aware that without maximal medical management the underlying atherosclerotic disease process will progress, limiting the benefit of any interventions. The patient is currently on a statin: lovastatin. The patient is currently on an anti-platelet: aspirin and plavix.   Thank you for allowing [place] to participate in this patient's care.  Virgina Jock, PA-C [facility] Office: [phone] Pager: [phone] 06/01/2014, 4:34 PM  Addendum  I have independently interviewed and examined the patient, and I agree with the physician assistant's findings.  Per NASCET, no evidence for intervention on ICA <50%.  Pt's LLE ulcer is due to primarily venous pathophysiology rather than arterial disease.  I have recommended compressive therapy for management of such.  Adele Barthel, MD [facility] Office: [phone] Pager:  [phone]  06/01/2014, 8:20 PM

## 2014-06-04 NOTE — Addendum Note (Signed)
Addended by: Mena Goes on: 06/04/2014 05:17 PM   Modules accepted: Orders

## 2014-06-11 DIAGNOSIS — Z79899 Other long term (current) drug therapy: Secondary | ICD-10-CM | POA: Diagnosis not present

## 2014-06-11 DIAGNOSIS — I10 Essential (primary) hypertension: Secondary | ICD-10-CM | POA: Diagnosis not present

## 2014-06-11 DIAGNOSIS — N189 Chronic kidney disease, unspecified: Secondary | ICD-10-CM | POA: Diagnosis not present

## 2014-06-11 DIAGNOSIS — R809 Proteinuria, unspecified: Secondary | ICD-10-CM | POA: Diagnosis not present

## 2014-06-11 DIAGNOSIS — D649 Anemia, unspecified: Secondary | ICD-10-CM | POA: Diagnosis not present

## 2014-06-11 DIAGNOSIS — E559 Vitamin D deficiency, unspecified: Secondary | ICD-10-CM | POA: Diagnosis not present

## 2014-06-12 ENCOUNTER — Ambulatory Visit (INDEPENDENT_AMBULATORY_CARE_PROVIDER_SITE_OTHER): Payer: Medicare Other | Admitting: Family Medicine

## 2014-06-12 ENCOUNTER — Encounter: Payer: Self-pay | Admitting: Family Medicine

## 2014-06-12 VITALS — BP 160/70 | HR 80 | Resp 16 | Ht 68.0 in | Wt 191.8 lb

## 2014-06-12 DIAGNOSIS — E1065 Type 1 diabetes mellitus with hyperglycemia: Secondary | ICD-10-CM

## 2014-06-12 DIAGNOSIS — Z23 Encounter for immunization: Secondary | ICD-10-CM | POA: Diagnosis not present

## 2014-06-12 DIAGNOSIS — E038 Other specified hypothyroidism: Secondary | ICD-10-CM | POA: Diagnosis not present

## 2014-06-12 DIAGNOSIS — I10 Essential (primary) hypertension: Secondary | ICD-10-CM | POA: Diagnosis not present

## 2014-06-12 DIAGNOSIS — E1143 Type 2 diabetes mellitus with diabetic autonomic (poly)neuropathy: Secondary | ICD-10-CM

## 2014-06-12 DIAGNOSIS — IMO0001 Reserved for inherently not codable concepts without codable children: Secondary | ICD-10-CM

## 2014-06-12 DIAGNOSIS — IMO0002 Reserved for concepts with insufficient information to code with codable children: Secondary | ICD-10-CM

## 2014-06-12 DIAGNOSIS — I6529 Occlusion and stenosis of unspecified carotid artery: Secondary | ICD-10-CM

## 2014-06-12 DIAGNOSIS — R569 Unspecified convulsions: Secondary | ICD-10-CM | POA: Diagnosis not present

## 2014-06-12 DIAGNOSIS — E1165 Type 2 diabetes mellitus with hyperglycemia: Secondary | ICD-10-CM

## 2014-06-12 DIAGNOSIS — G909 Disorder of the autonomic nervous system, unspecified: Secondary | ICD-10-CM

## 2014-06-12 DIAGNOSIS — E1149 Type 2 diabetes mellitus with other diabetic neurological complication: Secondary | ICD-10-CM

## 2014-06-12 DIAGNOSIS — Z794 Long term (current) use of insulin: Secondary | ICD-10-CM

## 2014-06-12 MED ORDER — CHLORTHALIDONE 50 MG PO TABS: 50.0000 mg | ORAL_TABLET | Freq: Every day | ORAL | Status: DC

## 2014-06-12 NOTE — Assessment & Plan Note (Signed)
Uncontrolled DASH diet and commitment to daily physical activity for a minimum of 30 minutes discussed and encouraged, as a part of hypertension management. The importance of attaining a healthy weight is also discussed. Increase chlorthal 50 mg daily

## 2014-06-12 NOTE — Patient Instructions (Signed)
F/u in 3.5 month, call if you need me before, rectal at that visit when due  PREVNAR today   Increase in chlorthalidone for your blood pressure to 50 mg once daily, OK to take TWO 25 mg tablets daily till done), your bP ias too high, you need to reduce salt intake and eat a little less and more vegetable to improve blood pressure, blood sugar and lose weight   Labs will be obtained from Endo and nephrologist

## 2014-06-13 DIAGNOSIS — E559 Vitamin D deficiency, unspecified: Secondary | ICD-10-CM | POA: Diagnosis not present

## 2014-06-13 DIAGNOSIS — R809 Proteinuria, unspecified: Secondary | ICD-10-CM | POA: Diagnosis not present

## 2014-06-13 DIAGNOSIS — N184 Chronic kidney disease, stage 4 (severe): Secondary | ICD-10-CM | POA: Diagnosis not present

## 2014-06-13 DIAGNOSIS — D649 Anemia, unspecified: Secondary | ICD-10-CM | POA: Diagnosis not present

## 2014-07-03 ENCOUNTER — Other Ambulatory Visit: Payer: Self-pay | Admitting: Urology

## 2014-07-03 ENCOUNTER — Ambulatory Visit (INDEPENDENT_AMBULATORY_CARE_PROVIDER_SITE_OTHER): Payer: Medicare Other | Admitting: Urology

## 2014-07-03 DIAGNOSIS — N138 Other obstructive and reflux uropathy: Secondary | ICD-10-CM

## 2014-07-03 DIAGNOSIS — N433 Hydrocele, unspecified: Secondary | ICD-10-CM

## 2014-07-03 DIAGNOSIS — N401 Enlarged prostate with lower urinary tract symptoms: Secondary | ICD-10-CM

## 2014-07-24 DIAGNOSIS — L97309 Non-pressure chronic ulcer of unspecified ankle with unspecified severity: Secondary | ICD-10-CM | POA: Diagnosis not present

## 2014-07-24 DIAGNOSIS — I69959 Hemiplegia and hemiparesis following unspecified cerebrovascular disease affecting unspecified side: Secondary | ICD-10-CM | POA: Diagnosis not present

## 2014-07-24 DIAGNOSIS — L97929 Non-pressure chronic ulcer of unspecified part of left lower leg with unspecified severity: Secondary | ICD-10-CM | POA: Diagnosis not present

## 2014-07-24 DIAGNOSIS — E119 Type 2 diabetes mellitus without complications: Secondary | ICD-10-CM | POA: Diagnosis not present

## 2014-07-24 DIAGNOSIS — Z794 Long term (current) use of insulin: Secondary | ICD-10-CM | POA: Diagnosis not present

## 2014-07-24 DIAGNOSIS — I83219 Varicose veins of right lower extremity with both ulcer of unspecified site and inflammation: Secondary | ICD-10-CM | POA: Diagnosis not present

## 2014-07-24 DIAGNOSIS — Z79899 Other long term (current) drug therapy: Secondary | ICD-10-CM | POA: Diagnosis not present

## 2014-07-24 DIAGNOSIS — I872 Venous insufficiency (chronic) (peripheral): Secondary | ICD-10-CM | POA: Diagnosis not present

## 2014-07-24 DIAGNOSIS — I83229 Varicose veins of left lower extremity with both ulcer of unspecified site and inflammation: Secondary | ICD-10-CM | POA: Diagnosis not present

## 2014-07-26 ENCOUNTER — Other Ambulatory Visit: Payer: Self-pay | Admitting: Family Medicine

## 2014-07-31 DIAGNOSIS — L97929 Non-pressure chronic ulcer of unspecified part of left lower leg with unspecified severity: Secondary | ICD-10-CM | POA: Diagnosis not present

## 2014-07-31 DIAGNOSIS — I69959 Hemiplegia and hemiparesis following unspecified cerebrovascular disease affecting unspecified side: Secondary | ICD-10-CM | POA: Diagnosis not present

## 2014-07-31 DIAGNOSIS — Z794 Long term (current) use of insulin: Secondary | ICD-10-CM | POA: Diagnosis not present

## 2014-07-31 DIAGNOSIS — I872 Venous insufficiency (chronic) (peripheral): Secondary | ICD-10-CM | POA: Diagnosis not present

## 2014-07-31 DIAGNOSIS — L97309 Non-pressure chronic ulcer of unspecified ankle with unspecified severity: Secondary | ICD-10-CM | POA: Diagnosis not present

## 2014-07-31 DIAGNOSIS — E119 Type 2 diabetes mellitus without complications: Secondary | ICD-10-CM | POA: Diagnosis not present

## 2014-07-31 DIAGNOSIS — I83229 Varicose veins of left lower extremity with both ulcer of unspecified site and inflammation: Secondary | ICD-10-CM | POA: Diagnosis not present

## 2014-07-31 DIAGNOSIS — Z79899 Other long term (current) drug therapy: Secondary | ICD-10-CM | POA: Diagnosis not present

## 2014-07-31 DIAGNOSIS — I83219 Varicose veins of right lower extremity with both ulcer of unspecified site and inflammation: Secondary | ICD-10-CM | POA: Diagnosis not present

## 2014-08-07 DIAGNOSIS — L97309 Non-pressure chronic ulcer of unspecified ankle with unspecified severity: Secondary | ICD-10-CM | POA: Diagnosis not present

## 2014-08-07 DIAGNOSIS — I83229 Varicose veins of left lower extremity with both ulcer of unspecified site and inflammation: Secondary | ICD-10-CM | POA: Diagnosis not present

## 2014-08-07 DIAGNOSIS — I87319 Chronic venous hypertension (idiopathic) with ulcer of unspecified lower extremity: Secondary | ICD-10-CM | POA: Diagnosis not present

## 2014-08-07 DIAGNOSIS — I83219 Varicose veins of right lower extremity with both ulcer of unspecified site and inflammation: Secondary | ICD-10-CM | POA: Diagnosis not present

## 2014-08-07 DIAGNOSIS — I872 Venous insufficiency (chronic) (peripheral): Secondary | ICD-10-CM | POA: Diagnosis not present

## 2014-08-07 DIAGNOSIS — L97909 Non-pressure chronic ulcer of unspecified part of unspecified lower leg with unspecified severity: Secondary | ICD-10-CM | POA: Diagnosis not present

## 2014-08-08 ENCOUNTER — Other Ambulatory Visit: Payer: Self-pay

## 2014-08-08 DIAGNOSIS — E785 Hyperlipidemia, unspecified: Secondary | ICD-10-CM

## 2014-08-09 ENCOUNTER — Other Ambulatory Visit: Payer: Self-pay | Admitting: Family Medicine

## 2014-08-09 DIAGNOSIS — I10 Essential (primary) hypertension: Secondary | ICD-10-CM | POA: Diagnosis not present

## 2014-08-09 DIAGNOSIS — E1149 Type 2 diabetes mellitus with other diabetic neurological complication: Secondary | ICD-10-CM | POA: Diagnosis not present

## 2014-08-09 DIAGNOSIS — E785 Hyperlipidemia, unspecified: Secondary | ICD-10-CM | POA: Diagnosis not present

## 2014-08-09 DIAGNOSIS — G909 Disorder of the autonomic nervous system, unspecified: Secondary | ICD-10-CM | POA: Diagnosis not present

## 2014-08-09 LAB — LIPID PANEL
Cholesterol: 157 mg/dL (ref 0–200)
HDL: 71 mg/dL (ref >39)
LDL Cholesterol: 79 mg/dL (ref 0–99)
Total CHOL/HDL Ratio: 2.2 Ratio
Triglycerides: 35 mg/dL (ref <150)
VLDL: 7 mg/dL (ref 0–40)

## 2014-08-09 LAB — BASIC METABOLIC PANEL
BUN: 42 mg/dL — ABNORMAL HIGH (ref 6–23)
CO2: 22 mEq/L (ref 19–32)
Calcium: 9.3 mg/dL (ref 8.4–10.5)
Chloride: 111 mEq/L (ref 96–112)
Creat: 2.83 mg/dL — ABNORMAL HIGH (ref 0.50–1.35)
Glucose, Bld: 116 mg/dL — ABNORMAL HIGH (ref 70–99)
Potassium: 4.6 mEq/L (ref 3.5–5.3)
Sodium: 140 mEq/L (ref 135–145)

## 2014-08-10 LAB — MICROALBUMIN / CREATININE URINE RATIO
Creatinine, Urine: 137.2 mg/dL
Microalb Creat Ratio: 468 mg/g — ABNORMAL HIGH (ref 0.0–30.0)
Microalb, Ur: 64.21 mg/dL — ABNORMAL HIGH (ref 0.00–1.89)

## 2014-08-15 DIAGNOSIS — L97929 Non-pressure chronic ulcer of unspecified part of left lower leg with unspecified severity: Secondary | ICD-10-CM | POA: Diagnosis not present

## 2014-08-15 DIAGNOSIS — I83219 Varicose veins of right lower extremity with both ulcer of unspecified site and inflammation: Secondary | ICD-10-CM | POA: Diagnosis not present

## 2014-08-15 DIAGNOSIS — I87319 Chronic venous hypertension (idiopathic) with ulcer of unspecified lower extremity: Secondary | ICD-10-CM | POA: Diagnosis not present

## 2014-08-15 DIAGNOSIS — I872 Venous insufficiency (chronic) (peripheral): Secondary | ICD-10-CM | POA: Diagnosis not present

## 2014-08-15 DIAGNOSIS — L97909 Non-pressure chronic ulcer of unspecified part of unspecified lower leg with unspecified severity: Secondary | ICD-10-CM | POA: Diagnosis not present

## 2014-08-15 DIAGNOSIS — I83229 Varicose veins of left lower extremity with both ulcer of unspecified site and inflammation: Secondary | ICD-10-CM | POA: Diagnosis not present

## 2014-08-15 DIAGNOSIS — L97309 Non-pressure chronic ulcer of unspecified ankle with unspecified severity: Secondary | ICD-10-CM | POA: Diagnosis not present

## 2014-08-22 DIAGNOSIS — I872 Venous insufficiency (chronic) (peripheral): Secondary | ICD-10-CM | POA: Diagnosis not present

## 2014-08-22 DIAGNOSIS — L97909 Non-pressure chronic ulcer of unspecified part of unspecified lower leg with unspecified severity: Secondary | ICD-10-CM | POA: Diagnosis not present

## 2014-08-22 DIAGNOSIS — I87319 Chronic venous hypertension (idiopathic) with ulcer of unspecified lower extremity: Secondary | ICD-10-CM | POA: Diagnosis not present

## 2014-08-22 DIAGNOSIS — I83219 Varicose veins of right lower extremity with both ulcer of unspecified site and inflammation: Secondary | ICD-10-CM | POA: Diagnosis not present

## 2014-08-22 DIAGNOSIS — I83229 Varicose veins of left lower extremity with both ulcer of unspecified site and inflammation: Secondary | ICD-10-CM | POA: Diagnosis not present

## 2014-08-22 DIAGNOSIS — L97929 Non-pressure chronic ulcer of unspecified part of left lower leg with unspecified severity: Secondary | ICD-10-CM | POA: Diagnosis not present

## 2014-08-22 DIAGNOSIS — L97309 Non-pressure chronic ulcer of unspecified ankle with unspecified severity: Secondary | ICD-10-CM | POA: Diagnosis not present

## 2014-08-28 DIAGNOSIS — I872 Venous insufficiency (chronic) (peripheral): Secondary | ICD-10-CM | POA: Diagnosis not present

## 2014-08-28 DIAGNOSIS — I87319 Chronic venous hypertension (idiopathic) with ulcer of unspecified lower extremity: Secondary | ICD-10-CM | POA: Diagnosis not present

## 2014-08-28 DIAGNOSIS — L97309 Non-pressure chronic ulcer of unspecified ankle with unspecified severity: Secondary | ICD-10-CM | POA: Diagnosis not present

## 2014-08-28 DIAGNOSIS — L97909 Non-pressure chronic ulcer of unspecified part of unspecified lower leg with unspecified severity: Secondary | ICD-10-CM | POA: Diagnosis not present

## 2014-09-05 DIAGNOSIS — L97309 Non-pressure chronic ulcer of unspecified ankle with unspecified severity: Secondary | ICD-10-CM | POA: Diagnosis not present

## 2014-09-05 DIAGNOSIS — L97929 Non-pressure chronic ulcer of unspecified part of left lower leg with unspecified severity: Secondary | ICD-10-CM | POA: Diagnosis not present

## 2014-09-05 DIAGNOSIS — I87319 Chronic venous hypertension (idiopathic) with ulcer of unspecified lower extremity: Secondary | ICD-10-CM | POA: Diagnosis not present

## 2014-09-05 DIAGNOSIS — I872 Venous insufficiency (chronic) (peripheral): Secondary | ICD-10-CM | POA: Diagnosis not present

## 2014-09-05 DIAGNOSIS — I83229 Varicose veins of left lower extremity with both ulcer of unspecified site and inflammation: Secondary | ICD-10-CM | POA: Diagnosis not present

## 2014-09-05 DIAGNOSIS — I83219 Varicose veins of right lower extremity with both ulcer of unspecified site and inflammation: Secondary | ICD-10-CM | POA: Diagnosis not present

## 2014-09-05 DIAGNOSIS — L97909 Non-pressure chronic ulcer of unspecified part of unspecified lower leg with unspecified severity: Secondary | ICD-10-CM | POA: Diagnosis not present

## 2014-09-10 DIAGNOSIS — E1342 Other specified diabetes mellitus with diabetic polyneuropathy: Secondary | ICD-10-CM | POA: Diagnosis not present

## 2014-09-10 DIAGNOSIS — B351 Tinea unguium: Secondary | ICD-10-CM | POA: Diagnosis not present

## 2014-09-10 DIAGNOSIS — R600 Localized edema: Secondary | ICD-10-CM | POA: Diagnosis not present

## 2014-09-12 DIAGNOSIS — L97322 Non-pressure chronic ulcer of left ankle with fat layer exposed: Secondary | ICD-10-CM | POA: Diagnosis not present

## 2014-09-12 DIAGNOSIS — I87312 Chronic venous hypertension (idiopathic) with ulcer of left lower extremity: Secondary | ICD-10-CM | POA: Diagnosis not present

## 2014-09-12 DIAGNOSIS — L97321 Non-pressure chronic ulcer of left ankle limited to breakdown of skin: Secondary | ICD-10-CM | POA: Diagnosis not present

## 2014-09-12 DIAGNOSIS — I872 Venous insufficiency (chronic) (peripheral): Secondary | ICD-10-CM | POA: Diagnosis not present

## 2014-09-18 ENCOUNTER — Ambulatory Visit: Payer: Medicare Other | Admitting: Family Medicine

## 2014-09-18 DIAGNOSIS — N183 Chronic kidney disease, stage 3 (moderate): Secondary | ICD-10-CM | POA: Diagnosis not present

## 2014-09-18 DIAGNOSIS — E559 Vitamin D deficiency, unspecified: Secondary | ICD-10-CM | POA: Diagnosis not present

## 2014-09-18 DIAGNOSIS — R809 Proteinuria, unspecified: Secondary | ICD-10-CM | POA: Diagnosis not present

## 2014-09-18 DIAGNOSIS — Z79899 Other long term (current) drug therapy: Secondary | ICD-10-CM | POA: Diagnosis not present

## 2014-09-18 DIAGNOSIS — I10 Essential (primary) hypertension: Secondary | ICD-10-CM | POA: Diagnosis not present

## 2014-09-18 DIAGNOSIS — D649 Anemia, unspecified: Secondary | ICD-10-CM | POA: Diagnosis not present

## 2014-09-19 DIAGNOSIS — I129 Hypertensive chronic kidney disease with stage 1 through stage 4 chronic kidney disease, or unspecified chronic kidney disease: Secondary | ICD-10-CM | POA: Diagnosis not present

## 2014-09-19 DIAGNOSIS — N184 Chronic kidney disease, stage 4 (severe): Secondary | ICD-10-CM | POA: Diagnosis not present

## 2014-09-19 DIAGNOSIS — D638 Anemia in other chronic diseases classified elsewhere: Secondary | ICD-10-CM | POA: Diagnosis not present

## 2014-09-19 DIAGNOSIS — E559 Vitamin D deficiency, unspecified: Secondary | ICD-10-CM | POA: Diagnosis not present

## 2014-09-24 ENCOUNTER — Telehealth: Payer: Self-pay | Admitting: Family Medicine

## 2014-09-25 NOTE — Telephone Encounter (Signed)
Patient has been assisted.

## 2014-09-26 ENCOUNTER — Ambulatory Visit (INDEPENDENT_AMBULATORY_CARE_PROVIDER_SITE_OTHER): Payer: Medicare Other | Admitting: Family Medicine

## 2014-09-26 ENCOUNTER — Encounter: Payer: Self-pay | Admitting: Family Medicine

## 2014-09-26 VITALS — BP 170/78 | HR 77 | Resp 16 | Ht 68.0 in | Wt 190.4 lb

## 2014-09-26 DIAGNOSIS — G40209 Localization-related (focal) (partial) symptomatic epilepsy and epileptic syndromes with complex partial seizures, not intractable, without status epilepticus: Secondary | ICD-10-CM | POA: Diagnosis not present

## 2014-09-26 DIAGNOSIS — I83023 Varicose veins of left lower extremity with ulcer of ankle: Secondary | ICD-10-CM

## 2014-09-26 DIAGNOSIS — I83009 Varicose veins of unspecified lower extremity with ulcer of unspecified site: Secondary | ICD-10-CM | POA: Insufficient documentation

## 2014-09-26 DIAGNOSIS — L97322 Non-pressure chronic ulcer of left ankle with fat layer exposed: Secondary | ICD-10-CM | POA: Diagnosis not present

## 2014-09-26 DIAGNOSIS — E1065 Type 1 diabetes mellitus with hyperglycemia: Secondary | ICD-10-CM | POA: Diagnosis not present

## 2014-09-26 DIAGNOSIS — I83021 Varicose veins of left lower extremity with ulcer of thigh: Secondary | ICD-10-CM

## 2014-09-26 DIAGNOSIS — I6529 Occlusion and stenosis of unspecified carotid artery: Secondary | ICD-10-CM | POA: Diagnosis not present

## 2014-09-26 DIAGNOSIS — I872 Venous insufficiency (chronic) (peripheral): Secondary | ICD-10-CM | POA: Diagnosis not present

## 2014-09-26 DIAGNOSIS — E785 Hyperlipidemia, unspecified: Secondary | ICD-10-CM

## 2014-09-26 DIAGNOSIS — E038 Other specified hypothyroidism: Secondary | ICD-10-CM | POA: Diagnosis not present

## 2014-09-26 DIAGNOSIS — I83022 Varicose veins of left lower extremity with ulcer of calf: Secondary | ICD-10-CM

## 2014-09-26 DIAGNOSIS — Z23 Encounter for immunization: Secondary | ICD-10-CM

## 2014-09-26 DIAGNOSIS — Z794 Long term (current) use of insulin: Principal | ICD-10-CM

## 2014-09-26 DIAGNOSIS — I150 Renovascular hypertension: Secondary | ICD-10-CM | POA: Diagnosis not present

## 2014-09-26 DIAGNOSIS — E669 Obesity, unspecified: Secondary | ICD-10-CM

## 2014-09-26 DIAGNOSIS — I87312 Chronic venous hypertension (idiopathic) with ulcer of left lower extremity: Secondary | ICD-10-CM | POA: Diagnosis not present

## 2014-09-26 DIAGNOSIS — I83025 Varicose veins of left lower extremity with ulcer other part of foot: Secondary | ICD-10-CM

## 2014-09-26 DIAGNOSIS — E1165 Type 2 diabetes mellitus with hyperglycemia: Principal | ICD-10-CM

## 2014-09-26 DIAGNOSIS — I83024 Varicose veins of left lower extremity with ulcer of heel and midfoot: Secondary | ICD-10-CM

## 2014-09-26 DIAGNOSIS — I83029 Varicose veins of left lower extremity with ulcer of unspecified site: Secondary | ICD-10-CM

## 2014-09-26 DIAGNOSIS — I83028 Varicose veins of left lower extremity with ulcer other part of lower leg: Secondary | ICD-10-CM

## 2014-09-26 DIAGNOSIS — L97909 Non-pressure chronic ulcer of unspecified part of unspecified lower leg with unspecified severity: Secondary | ICD-10-CM | POA: Insufficient documentation

## 2014-09-26 DIAGNOSIS — L97929 Non-pressure chronic ulcer of unspecified part of left lower leg with unspecified severity: Secondary | ICD-10-CM

## 2014-09-26 DIAGNOSIS — IMO0001 Reserved for inherently not codable concepts without codable children: Secondary | ICD-10-CM

## 2014-09-26 DIAGNOSIS — L97321 Non-pressure chronic ulcer of left ankle limited to breakdown of skin: Secondary | ICD-10-CM | POA: Diagnosis not present

## 2014-09-26 NOTE — Assessment & Plan Note (Signed)
Vaccine administered at visit.  

## 2014-09-26 NOTE — Assessment & Plan Note (Signed)
Controled on kepra, pt and wife advised of the need to remain on medication, they agree and understand

## 2014-09-26 NOTE — Progress Notes (Signed)
Subjective:    Patient ID: Chase [name], male    DOB: 10-07-1931, 80 y.o.   MRN: [id number]  HPI The PT is here for follow up and re-evaluation of chronic medical conditions, medication management and review of any available recent lab and radiology data.  Preventive health is updated, specifically  Cancer screening and Immunization.Needs flu vaccine   Currently being treated for left leg ulcer for past 2 months. Has seen vascular surgeon re carotid stenosis in the Summer. The PT denies any adverse reactions to current medications since the last visit.  There are no new concerns. Wife still is concerned that he does not do anything much iun the home, he still enjoys playing cards however, he states due to poor grip and reduced sensation in the hands feels limited in what he can do, will try to do more There are no specific complaints Has not been to endo for months , needs updated lab and follow up      Review of Systems See HPI Denies recent fever or chills. Denies sinus pressure, nasal congestion, ear pain or sore throat. Denies chest congestion, productive cough or wheezing. Denies chest pains, palpitations and leg swelling Denies abdominal pain, nausea, vomiting,diarrhea or constipation.   Denies dysuria, frequency, hesitancy or incontinence. Seizures are controlled on medication Denies depression, anxiety or insomnia.       Objective:   Physical Exam BP 170/78  Pulse 77  Resp 16  Ht 5\' 8"  (1.727 m)  Wt 190 lb 6.4 oz (86.365 kg)  BMI 28.96 kg/m2  SpO2 98% Patient alert and oriented and in no cardiopulmonary distress.  HEENT: No facial asymmetry, EOMI,   oropharynx pink and moist.  Neck supple no JVD, no mass.  Chest: Clear to auscultation bilaterally.  CVS: S1, S2 no murmurs, no S3.Regular rate.  ABD: Soft non tender.   Ext: No edema  MS: Adequate though reduced  ROM spine, shoulders, hips and knees.  Skin: left leg ulcer , remained bandaged during exam,  pt on his way to wound clinic Psych: Good eye contact, normal affect. Memory intact not anxious or depressed appearing.  CNS: CN 2-12 intact,reduced grip in both hands right worse than left, also reduced sensation in hands and feet       Assessment & Plan:  Renovascular hypertension Uncontrolled, no med since yesterday, importance of taking daily at the same time is stressed, sttaes since he had not eaten had not taken his med. DASH diet and commitment to daily physical activity for a minimum of 30 minutes discussed and encouraged, as a part of hypertension management. The importance of attaining a healthy weight is also discussed.   Diabetes mellitus, insulin dependent (IDDM), uncontrolled Updated lab needed at/ before next visit. Pt advised of the need to schedule and keep appt with endo Wife reports overall improvement in blood sugar, lab is ordered Patient advised to reduce carb and sweets, commit to regular physical activity, take meds as prescribed, test blood as directed, and attempt to lose weight, to improve blood sugar control.   Hypothyroidism Updated lab needed at/ before next visit. Treated by endo  Complex partial seizure disorder Controled on kepra, pt and wife advised of the need to remain on medication, they agree and understand  Hyperlipidemia LDL goal <100 Controlled, no change in medication Hyperlipidemia:Low fat diet discussed and encouraged.    Need for prophylactic vaccination and inoculation against influenza Vaccine administered at visit.   Stasis leg ulcer Recurrent skin breakdown  on left leg, seeing wound specialist for approx 2 months. Leg wrapped today, not examined, pt on his way to wound center after this appt, reports healing

## 2014-09-26 NOTE — Assessment & Plan Note (Addendum)
Updated lab needed at/ before next visit. Pt advised of the need to schedule and keep appt with endo Wife reports overall improvement in blood sugar, lab is ordered Patient advised to reduce carb and sweets, commit to regular physical activity, take meds as prescribed, test blood as directed, and attempt to lose weight, to improve blood sugar control.

## 2014-09-26 NOTE — Addendum Note (Signed)
Addended by: Denman Christopherjame B on: 09/26/2014 04:43 PM   Modules accepted: Orders

## 2014-09-26 NOTE — Assessment & Plan Note (Signed)
Recurrent skin breakdown on left leg, seeing wound specialist for approx 2 months. Leg wrapped today, not examined, pt on his way to wound center after this appt, reports healing

## 2014-09-26 NOTE — Assessment & Plan Note (Signed)
Controlled, no change in medication Hyperlipidemia:Low fat diet discussed and encouraged.  \ 

## 2014-09-26 NOTE — Assessment & Plan Note (Signed)
Uncontrolled, no med since yesterday, importance of taking daily at the same time is stressed, sttaes since he had not eaten had not taken his med. DASH diet and commitment to daily physical activity for a minimum of 30 minutes discussed and encouraged, as a part of hypertension management. The importance of attaining a healthy weight is also discussed.

## 2014-09-26 NOTE — Assessment & Plan Note (Signed)
Updated lab needed at/ before next visit. Treated by endo

## 2014-09-26 NOTE — Patient Instructions (Addendum)
F/u in  4 month, call if you need me before  , fasting lipid, cmp and eGFR, hBA1C and TSH this week or next week, copy to Dr Dorris Fetch  Need to make appt to see Dr Dorris Fetch  YOu are referred for eye exam  Flu vaccine today  pls get more involved with house chores  Keep well, and hope ulcer keeps healing  Stay on kepra

## 2014-09-30 DIAGNOSIS — Z23 Encounter for immunization: Secondary | ICD-10-CM | POA: Insufficient documentation

## 2014-09-30 NOTE — Assessment & Plan Note (Signed)
Stable and controlled on kepra Followed by neurology

## 2014-09-30 NOTE — Assessment & Plan Note (Signed)
Progressive loss of sensation and strength in hands and feet Pt encouraged to use both as much as possible to improve and prolong function

## 2014-09-30 NOTE — Progress Notes (Signed)
   Subjective:    Patient ID: Chase [name], male    DOB: 08/25/1931, 80 y.o.   MRN: [id number]  HPI The PT is here for follow up and re-evaluation of chronic medical conditions, medication management and review of any available recent lab and radiology data.  Preventive health is updated, specifically  Cancer screening and Immunization.   Questions or concerns regarding consultations or procedures which the PT has had in the interim are  addressed. The PT denies any adverse reactions to current medications since the last visit.  There are no new concerns. Wife is concerned that pt does no house chores, feels that his ability to function and quality of life would both improve if he did There are no specific complaints        Review of Systems See HPI Denies recent fever or chills. Denies sinus pressure, nasal congestion, ear pain or sore throat. Denies chest congestion, productive cough or wheezing. Denies chest pains, palpitations and leg swelling Denies abdominal pain, nausea, vomiting,diarrhea or constipation.   Denies dysuria, frequency, hesitancy or incontinence. . Denies headaches,  Denies depression, anxiety or insomnia. Denies skin break down or rash.        Objective:   Physical Exam BP 160/70  Pulse 80  Resp 16  Ht 5\' 8"  (1.727 m)  Wt 191 lb 12.8 oz (87 kg)  BMI 29.17 kg/m2  SpO2 100% Patient alert and oriented and in no cardiopulmonary distress.  HEENT: No facial asymmetry, EOMI,   oropharynx pink and moist.  Neck decreased ROM no JVD, no mass.  Chest: Clear to auscultation bilaterally.  CVS: S1, S2 no murmurs, no S3.Regular rate.  ABD: Soft non tender.   Ext: No edema  [id number]  ROM spine, shoulders, hips and knees.  Skin: Intact, no ulcerations or rash noted.  Psych: Good eye contact, normal affect. Memory intact not anxious or depressed appearing.  CNS: CN 2-12 intact, power,  Decreased grip bilaterally and reduced sensation in hands and  feet       Assessment & Plan:  Renovascular hypertension Uncontrolled DASH diet and commitment to daily physical activity for a minimum of 30 minutes discussed and encouraged, as a part of hypertension management. The importance of attaining a healthy weight is also discussed. Increase chlorthal 50 mg daily  Diabetes mellitus, insulin dependent (IDDM), uncontrolled Patient advised to reduce carb and sweets, commit to regular physical activity, take meds as prescribed, test blood as directed, and attempt to lose weight, to improve blood sugar control. Updated lab needed at/ before next visit.   Hypothyroidism Updated lab from endo, managed by endo   Complex partial seizure disorder Stable and controlled on kepra Followed by neurology  Peripheral autonomic neuropathy due to diabetes mellitus Progressive loss of sensation and strength in hands and feet Pt encouraged to use both as much as possible to improve and prolong function  Need for vaccination with 13-polyvalent pneumococcal conjugate vaccine Vaccine administered at visit. On 06/12/2014

## 2014-09-30 NOTE — Assessment & Plan Note (Signed)
Patient advised to reduce carb and sweets, commit to regular physical activity, take meds as prescribed, test blood as directed, and attempt to lose weight, to improve blood sugar control. Updated lab needed at/ before next visit.  

## 2014-09-30 NOTE — Assessment & Plan Note (Signed)
Vaccine administered at visit. On 06/12/2014

## 2014-09-30 NOTE — Assessment & Plan Note (Signed)
Updated lab from endo, managed by endo

## 2014-10-02 ENCOUNTER — Other Ambulatory Visit: Payer: Self-pay | Admitting: Urology

## 2014-10-02 DIAGNOSIS — N433 Hydrocele, unspecified: Secondary | ICD-10-CM

## 2014-10-02 DIAGNOSIS — E1165 Type 2 diabetes mellitus with hyperglycemia: Secondary | ICD-10-CM | POA: Diagnosis not present

## 2014-10-02 DIAGNOSIS — E782 Mixed hyperlipidemia: Secondary | ICD-10-CM | POA: Diagnosis not present

## 2014-10-02 DIAGNOSIS — I10 Essential (primary) hypertension: Secondary | ICD-10-CM | POA: Diagnosis not present

## 2014-10-03 DIAGNOSIS — I83023 Varicose veins of left lower extremity with ulcer of ankle: Secondary | ICD-10-CM | POA: Diagnosis not present

## 2014-10-03 DIAGNOSIS — I87312 Chronic venous hypertension (idiopathic) with ulcer of left lower extremity: Secondary | ICD-10-CM | POA: Diagnosis not present

## 2014-10-03 DIAGNOSIS — I872 Venous insufficiency (chronic) (peripheral): Secondary | ICD-10-CM | POA: Diagnosis not present

## 2014-10-03 DIAGNOSIS — L97321 Non-pressure chronic ulcer of left ankle limited to breakdown of skin: Secondary | ICD-10-CM | POA: Diagnosis not present

## 2014-10-09 DIAGNOSIS — E1122 Type 2 diabetes mellitus with diabetic chronic kidney disease: Secondary | ICD-10-CM | POA: Diagnosis not present

## 2014-10-09 DIAGNOSIS — E782 Mixed hyperlipidemia: Secondary | ICD-10-CM | POA: Diagnosis not present

## 2014-10-09 DIAGNOSIS — E038 Other specified hypothyroidism: Secondary | ICD-10-CM | POA: Diagnosis not present

## 2014-10-09 DIAGNOSIS — I10 Essential (primary) hypertension: Secondary | ICD-10-CM | POA: Diagnosis not present

## 2014-10-11 DIAGNOSIS — I8392 Asymptomatic varicose veins of left lower extremity: Secondary | ICD-10-CM | POA: Diagnosis not present

## 2014-10-11 DIAGNOSIS — I872 Venous insufficiency (chronic) (peripheral): Secondary | ICD-10-CM | POA: Diagnosis not present

## 2014-10-11 DIAGNOSIS — L97322 Non-pressure chronic ulcer of left ankle with fat layer exposed: Secondary | ICD-10-CM | POA: Diagnosis not present

## 2014-10-11 DIAGNOSIS — L97329 Non-pressure chronic ulcer of left ankle with unspecified severity: Secondary | ICD-10-CM | POA: Diagnosis not present

## 2014-10-11 DIAGNOSIS — I83023 Varicose veins of left lower extremity with ulcer of ankle: Secondary | ICD-10-CM | POA: Diagnosis not present

## 2014-10-12 ENCOUNTER — Ambulatory Visit: Payer: Medicare Other | Admitting: Cardiovascular Disease

## 2014-10-16 ENCOUNTER — Encounter: Payer: Self-pay | Admitting: Cardiovascular Disease

## 2014-10-16 ENCOUNTER — Ambulatory Visit (INDEPENDENT_AMBULATORY_CARE_PROVIDER_SITE_OTHER): Payer: Medicare Other | Admitting: Cardiovascular Disease

## 2014-10-16 VITALS — BP 160/71 | HR 65 | Ht 68.0 in | Wt 187.0 lb

## 2014-10-16 DIAGNOSIS — N189 Chronic kidney disease, unspecified: Secondary | ICD-10-CM

## 2014-10-16 DIAGNOSIS — I6523 Occlusion and stenosis of bilateral carotid arteries: Secondary | ICD-10-CM | POA: Diagnosis not present

## 2014-10-16 DIAGNOSIS — E7800 Pure hypercholesterolemia, unspecified: Secondary | ICD-10-CM

## 2014-10-16 DIAGNOSIS — I6529 Occlusion and stenosis of unspecified carotid artery: Secondary | ICD-10-CM | POA: Diagnosis not present

## 2014-10-16 DIAGNOSIS — I10 Essential (primary) hypertension: Secondary | ICD-10-CM | POA: Diagnosis not present

## 2014-10-16 DIAGNOSIS — I739 Peripheral vascular disease, unspecified: Secondary | ICD-10-CM | POA: Diagnosis not present

## 2014-10-16 DIAGNOSIS — R001 Bradycardia, unspecified: Secondary | ICD-10-CM | POA: Diagnosis not present

## 2014-10-16 DIAGNOSIS — H34212 Partial retinal artery occlusion, left eye: Secondary | ICD-10-CM | POA: Diagnosis not present

## 2014-10-16 MED ORDER — HYDRALAZINE HCL 50 MG PO TABS: 50.0000 mg | ORAL_TABLET | Freq: Two times a day (BID) | ORAL | Status: DC

## 2014-10-16 NOTE — Progress Notes (Signed)
Patient ID: KALVIN SPECKER, male   DOB: October 06, 1931, 80 y.o.   MRN: [id number]      SUBJECTIVE: Mr. Quattrochi is an 80 yr old male with a PMH which includes CVA, CKD, hypothyroidism, HTN, hypercholesterolemia, and IDDM. He denies chest pain and shortness of breath. He feels fatigued with exertion which is chronic. His wife says he snores but does not gasp for breath or stop breathing at night. He has chronic lower extremity swelling and venous insufficiency.  ECG performed in the office today demonstrates normal sinus rhythm with right bundle-branch block and left anterior fascicular block.   Review of Systems: As per "subjective", otherwise negative.  Allergies  Allergen Reactions  . Bayer Advanced Aspirin [Aspirin] Nausea And Vomiting  . Penicillins Nausea And Vomiting    Current Outpatient Prescriptions  Medication Sig Dispense Refill  . amLODipine (NORVASC) 10 MG tablet TAKE ONE TABLET BY MOUTH ONCE DAILY 30 tablet 3  . aspirin EC 81 MG tablet Take 1 tablet (81 mg total) by mouth daily. 150 tablet 2  . chlorthalidone (HYGROTON) 50 MG tablet Take 1 tablet (50 mg total) by mouth daily. 30 tablet 3  . clopidogrel (PLAVIX) 75 MG tablet TAKE ONE TABLET BY MOUTH IN THE MORNING WITH BREAKFAST 30 tablet 4  . hydrALAZINE (APRESOLINE) 25 MG tablet Take 1 tablet (25 mg total) by mouth 2 (two) times daily. 270 tablet 3  . insulin lispro protamine-insulin lispro (HUMALOG 75/25) (75-25) 100 UNIT/ML SUSP Inject 10 Units into the skin 2 (two) times daily. As directed    . levETIRAcetam (KEPPRA) 500 MG tablet Take 250 mg by mouth at bedtime. Take ONE_HALF TAB BY MOUTH AT BEDTIME    . levothyroxine (SYNTHROID, LEVOTHROID) 175 MCG tablet Take 175 mcg by mouth daily before breakfast.     . lovastatin (MEVACOR) 40 MG tablet TAKE ONE TABLET BY MOUTH AT BEDTIME 30 tablet 3  . Multiple Vitamins-Minerals (EYE VITAMINS PO) Take 1 tablet by mouth daily.     . tamsulosin (FLOMAX) 0.4 MG CAPS capsule Take 0.4 mg by  mouth.    . Vitamin D, Ergocalciferol, (DRISDOL) 50000 UNITS CAPS capsule Take 50,000 Units by mouth every 7 (seven) days.      No current facility-administered medications for this visit.    Past Medical History  Diagnosis Date  . Peripheral vascular disease, unspecified   . Depressive disorder, not elsewhere classified   . Obesity   . Other and unspecified hyperlipidemia   . Pain in joint, lower leg   . Osteoarthrosis, unspecified whether generalized or localized, lower leg   . Derangement of meniscus, not elsewhere classified   . Lumbago   . Spondylosis of unspecified site without mention of myelopathy   . Spinal stenosis, unspecified region other than cervical   . Backache, unspecified   . Family history of diabetes mellitus   . Seizures   . Complete lesion of cervical spinal cord 03/14/3012    Stable since 2006  . Lacunar stroke, acute 03/14/2012  . Bradycardia 03/15/2012  . Gait disorder   . Knee contracture   . Diabetic neuropathy   . CKD (chronic kidney disease) stage 3, GFR 30-59 ml/min   . CKD (chronic kidney disease) stage 4, GFR 15-29 ml/min   . Diabetes mellitus approx 1994  . Hypothyroidism approx 2000  . Unspecified hypothyroidism   . Hypertension approx 1994  . Unspecified essential hypertension   . CVA (cerebrovascular accident) 09/07/12    Past Surgical History  Procedure Laterality  Date  . Kidney stones left x2  1975  . Kidney surgery      Ruptured left kidney 30 yrs ago  from a kidney stone  . Colonoscopy N/A 08/10/2013    Procedure: COLONOSCOPY;  Surgeon: Rogene Houston, MD;  Location: [facility];  Service: Endoscopy;  Laterality: N/A;  240    History   Social History  . Marital Status: Married    Spouse Name: N/A    Number of Children: 8  . Years of Education: N/A   Occupational History  . retired     Social History Main Topics  . Smoking status: Never Smoker   . Smokeless tobacco: Never Used  . Alcohol Use: No  . Drug Use: No  . Sexual  Activity: No   Other Topics Concern  . Not on file   Social History Narrative     Filed Vitals:   10/16/14 1308  BP: 160/71  Pulse: 65  Height: 5\' 8"  (1.727 m)  Weight: 187 lb (84.823 kg)    PHYSICAL EXAM General: NAD HEENT: Normal. Neck: No JVD, no thyromegaly. Lungs: Clear to auscultation bilaterally with normal respiratory effort. CV: Nondisplaced PMI.  Regular rate and rhythm, normal S1/S2, no S3/S4, no murmur. Left leg bandaged. Trace pedal edema b/l.  No carotid bruit.    Abdomen: Soft, nontender, no hepatosplenomegaly, no distention.  Neurologic: Alert and oriented x 3.  Psych: Normal affect. Skin: Normal. Musculoskeletal: Normal range of motion, no gross deformities. Extremities: No clubbing or cyanosis.   ECG: Most recent ECG reviewed.      ASSESSMENT AND PLAN: 1. Essential HTN: Uncontrolled today. Increase hydralazine to 50 mg twice daily as he said he would not likely take a medication three times daily, and continue chlorthalidone 50 mg daily with close renal function monitoring, given his h/o CKD. Given the degree of left ventricular hypertrophy and moderate hypertensive retinopathy, I suspect his HTN is chronic and suboptimally controlled. Continue amlodipine 10 mg daily.  2. Left retinal arteriole cholesterol embolus: I will continue ASA 81 mg daily, in addition to his Plavix. Carotid artery disease is now followed by vascular surgery. 3. Hypercholesterolemia: Continue lovastatin 40 mg daily for now.  4. CKD: Continue to monitor. 5. Carotid artery stenosis: Being monitored by vascular surgery.  Dispo: f/u 1 year.   Kate Sable, M.D., F.A.C.C.

## 2014-10-16 NOTE — Patient Instructions (Signed)
   Increase Hydralazine to 50mg  twice a day  - new sent to pharm Continue all other medications.   Your physician wants you to follow up in:  1 year.  You will receive a reminder letter in the mail one-two months in advance.  If you don't receive a letter, please call our office to schedule the follow up appointment

## 2014-10-17 ENCOUNTER — Ambulatory Visit (HOSPITAL_COMMUNITY)
Admission: RE | Admit: 2014-10-17 | Discharge: 2014-10-17 | Disposition: A | Discharge status: Home / Self Care | Payer: Medicare Other | Source: Ambulatory Visit | Attending: Urology | Admitting: Urology

## 2014-10-17 DIAGNOSIS — N433 Hydrocele, unspecified: Secondary | ICD-10-CM | POA: Insufficient documentation

## 2014-10-18 DIAGNOSIS — L97322 Non-pressure chronic ulcer of left ankle with fat layer exposed: Secondary | ICD-10-CM | POA: Diagnosis not present

## 2014-10-18 DIAGNOSIS — L97329 Non-pressure chronic ulcer of left ankle with unspecified severity: Secondary | ICD-10-CM | POA: Diagnosis not present

## 2014-10-18 DIAGNOSIS — I8392 Asymptomatic varicose veins of left lower extremity: Secondary | ICD-10-CM | POA: Diagnosis not present

## 2014-10-18 DIAGNOSIS — I83023 Varicose veins of left lower extremity with ulcer of ankle: Secondary | ICD-10-CM | POA: Diagnosis not present

## 2014-10-18 DIAGNOSIS — I872 Venous insufficiency (chronic) (peripheral): Secondary | ICD-10-CM | POA: Diagnosis not present

## 2014-10-18 DIAGNOSIS — H2513 Age-related nuclear cataract, bilateral: Secondary | ICD-10-CM | POA: Diagnosis not present

## 2014-10-19 LAB — HEMOGLOBIN A1C: A1c: 7.4

## 2014-10-23 ENCOUNTER — Ambulatory Visit (INDEPENDENT_AMBULATORY_CARE_PROVIDER_SITE_OTHER): Payer: Medicare Other | Admitting: Urology

## 2014-10-23 DIAGNOSIS — N433 Hydrocele, unspecified: Secondary | ICD-10-CM

## 2014-10-23 DIAGNOSIS — N5201 Erectile dysfunction due to arterial insufficiency: Secondary | ICD-10-CM | POA: Diagnosis not present

## 2014-10-23 DIAGNOSIS — N401 Enlarged prostate with lower urinary tract symptoms: Secondary | ICD-10-CM

## 2014-10-24 ENCOUNTER — Other Ambulatory Visit (HOSPITAL_COMMUNITY): Payer: Medicare Other

## 2014-10-24 DIAGNOSIS — I872 Venous insufficiency (chronic) (peripheral): Secondary | ICD-10-CM | POA: Diagnosis not present

## 2014-10-24 DIAGNOSIS — L97322 Non-pressure chronic ulcer of left ankle with fat layer exposed: Secondary | ICD-10-CM | POA: Diagnosis not present

## 2014-10-24 DIAGNOSIS — I83023 Varicose veins of left lower extremity with ulcer of ankle: Secondary | ICD-10-CM | POA: Diagnosis not present

## 2014-10-24 DIAGNOSIS — I8392 Asymptomatic varicose veins of left lower extremity: Secondary | ICD-10-CM | POA: Diagnosis not present

## 2014-10-24 DIAGNOSIS — L97329 Non-pressure chronic ulcer of left ankle with unspecified severity: Secondary | ICD-10-CM | POA: Diagnosis not present

## 2014-10-31 DIAGNOSIS — L97322 Non-pressure chronic ulcer of left ankle with fat layer exposed: Secondary | ICD-10-CM | POA: Diagnosis not present

## 2014-10-31 DIAGNOSIS — L97329 Non-pressure chronic ulcer of left ankle with unspecified severity: Secondary | ICD-10-CM | POA: Diagnosis not present

## 2014-10-31 DIAGNOSIS — I8392 Asymptomatic varicose veins of left lower extremity: Secondary | ICD-10-CM | POA: Diagnosis not present

## 2014-10-31 DIAGNOSIS — I872 Venous insufficiency (chronic) (peripheral): Secondary | ICD-10-CM | POA: Diagnosis not present

## 2014-11-07 DIAGNOSIS — L97322 Non-pressure chronic ulcer of left ankle with fat layer exposed: Secondary | ICD-10-CM | POA: Diagnosis not present

## 2014-11-07 DIAGNOSIS — I83023 Varicose veins of left lower extremity with ulcer of ankle: Secondary | ICD-10-CM | POA: Diagnosis not present

## 2014-11-07 DIAGNOSIS — L97329 Non-pressure chronic ulcer of left ankle with unspecified severity: Secondary | ICD-10-CM | POA: Diagnosis not present

## 2014-11-15 ENCOUNTER — Encounter: Payer: Self-pay | Admitting: Vascular Surgery

## 2014-11-15 DIAGNOSIS — L97322 Non-pressure chronic ulcer of left ankle with fat layer exposed: Secondary | ICD-10-CM | POA: Diagnosis not present

## 2014-11-15 DIAGNOSIS — L97329 Non-pressure chronic ulcer of left ankle with unspecified severity: Secondary | ICD-10-CM | POA: Diagnosis not present

## 2014-11-15 DIAGNOSIS — I83023 Varicose veins of left lower extremity with ulcer of ankle: Secondary | ICD-10-CM | POA: Diagnosis not present

## 2014-11-16 ENCOUNTER — Ambulatory Visit (HOSPITAL_COMMUNITY)
Admission: RE | Admit: 2014-11-16 | Discharge: 2014-11-16 | Disposition: A | Discharge status: Home / Self Care | Payer: Medicare Other | Source: Ambulatory Visit | Attending: Vascular Surgery | Admitting: Vascular Surgery

## 2014-11-16 ENCOUNTER — Encounter: Payer: Self-pay | Admitting: Vascular Surgery

## 2014-11-16 ENCOUNTER — Ambulatory Visit (INDEPENDENT_AMBULATORY_CARE_PROVIDER_SITE_OTHER): Payer: Medicare Other | Admitting: Vascular Surgery

## 2014-11-16 VITALS — BP 154/68 | HR 72 | Ht 68.0 in | Wt 190.5 lb

## 2014-11-16 DIAGNOSIS — I739 Peripheral vascular disease, unspecified: Secondary | ICD-10-CM

## 2014-11-16 DIAGNOSIS — M79669 Pain in unspecified lower leg: Secondary | ICD-10-CM | POA: Diagnosis not present

## 2014-11-16 DIAGNOSIS — I872 Venous insufficiency (chronic) (peripheral): Secondary | ICD-10-CM | POA: Diagnosis not present

## 2014-11-16 DIAGNOSIS — I6529 Occlusion and stenosis of unspecified carotid artery: Secondary | ICD-10-CM | POA: Diagnosis not present

## 2014-11-16 DIAGNOSIS — M7989 Other specified soft tissue disorders: Secondary | ICD-10-CM | POA: Diagnosis not present

## 2014-11-16 NOTE — Addendum Note (Signed)
Addended by: Dorthula Rue L on: 11/16/2014 04:56 PM   Modules accepted: Orders

## 2014-11-16 NOTE — Progress Notes (Signed)
    Established Carotid/Chronic Venous Insufficiency Patient  History of Present Illness  Chase [name] is a 80 y.o. male  who presents with chief complaint: follow up on wound management.  Pt is getting L leg wound care with a unna boot vs compression wrap.  Pt just had the dressing placed yesterday.  The patient thinks his foot is healing well.  He denies any claudication or rest pain sx.  He returns today for BLE CVI testing.  Physical Examination  Filed Vitals:   11/16/14 1425  BP: 154/68  Pulse: 72  Height: 5\' 8"  (1.727 m)  Weight: 190 lb 8 oz (86.41 kg)  SpO2: 100%   Body mass index is 28.97 kg/(m^2).   General: A&O x 3, WDWN male in NAD  Head: [place]/AT   Pulmonary: Sym exp, good air movt, CTAB, no rales, rhonchi, & wheezing   Cardiac: RRR, Nl S1, S2, no Murmurs, rubs or gallops   Vascular:  Vessel  Right  Left   Radial  Faintly Palpable  Palpable   Brachial  Faintly Palpable  Palpable   Carotid  Palpable, without bruit  Palpable, without bruit   Aorta  Not palpable  N/A   Femoral  Palpable  Palpable   Popliteal  Not palpable  Not palpable   PT  Palpable  Not Palpable due to dressing  DP  Palpable  Not palpable due to dressing   Gastrointestinal: soft, NTND, -G/R, - HSM, - masses, - CVAT B, aorta not palpable due to pannus   Musculoskeletal: M/S 5/5 throughout except RUE, AROM is limited by pain, Extremities without ischemic changes except bandaged L leg, RLE edema 1+  Neurologic: Pain and light touch intact in extremities , Motor exam as listed above   Non-Invasive Vascular Imaging  BLE Venous Insufficiency (11/16/2014)   R: No SVT or DVT, R CFV reflux, no SSV or GSV reflux  L: No SVT or DVT, R CFV reflux, no GSV reflux (unna boot on)   Medical Decision Making  Chase [name] is a 80 y.o. male  who presents with: asx R ICA stenosis, sx <40% stenosis L ICA, CVI (C5): healed VSU   I recommend 20-30 mm Hg compression  stocking in both legs once the left leg is healed.    His CVI exam today is most normal with the compression bandage in place.  I expect without it the findings would be significantly worse.  I discussed in depth with the patient the nature of atherosclerosis, and emphasized the importance of maximal medical management including strict control of blood pressure, blood glucose, and lipid levels, antiplatelet agents, obtaining regular exercise, and cessation of smoking.   The patient is aware that without maximal medical management the underlying atherosclerotic disease process will progress, limiting the benefit of any interventions.  The patient is currently on a statin: lovastatin.  The patient is currently on an anti-platelet: aspirin and plavix.   The patient will continue with a carotid duplex in 6 months to continue monitoring his carotid arteries given prior L CVA.  Thank you for allowing [place] to participate in this patient's care.  Adele Barthel, MD [facility] Office: [phone] Pager: [phone]  11/16/2014, 3:33 PM

## 2014-11-19 DIAGNOSIS — E1342 Other specified diabetes mellitus with diabetic polyneuropathy: Secondary | ICD-10-CM | POA: Diagnosis not present

## 2014-11-19 DIAGNOSIS — R600 Localized edema: Secondary | ICD-10-CM | POA: Diagnosis not present

## 2014-11-19 DIAGNOSIS — L609 Nail disorder, unspecified: Secondary | ICD-10-CM | POA: Diagnosis not present

## 2014-11-19 DIAGNOSIS — B351 Tinea unguium: Secondary | ICD-10-CM | POA: Diagnosis not present

## 2014-11-22 DIAGNOSIS — L97329 Non-pressure chronic ulcer of left ankle with unspecified severity: Secondary | ICD-10-CM | POA: Diagnosis not present

## 2014-11-22 DIAGNOSIS — L97322 Non-pressure chronic ulcer of left ankle with fat layer exposed: Secondary | ICD-10-CM | POA: Diagnosis not present

## 2014-11-22 DIAGNOSIS — I83023 Varicose veins of left lower extremity with ulcer of ankle: Secondary | ICD-10-CM | POA: Diagnosis not present

## 2014-11-23 ENCOUNTER — Ambulatory Visit: Payer: Medicare Other | Admitting: Vascular Surgery

## 2014-11-23 ENCOUNTER — Encounter (HOSPITAL_COMMUNITY): Payer: Medicare Other

## 2014-11-30 ENCOUNTER — Inpatient Hospital Stay (HOSPITAL_COMMUNITY)
Admission: EM | Admit: 2014-11-30 | Discharge: 2014-12-06 | DRG: 418 | Disposition: A | Discharge status: Skilled Nursing Facility | Payer: Medicare Other | Attending: Internal Medicine | Admitting: Internal Medicine

## 2014-11-30 ENCOUNTER — Emergency Department (HOSPITAL_COMMUNITY): Payer: Medicare Other

## 2014-11-30 ENCOUNTER — Encounter (HOSPITAL_COMMUNITY): Payer: Self-pay | Admitting: *Deleted

## 2014-11-30 DIAGNOSIS — E785 Hyperlipidemia, unspecified: Secondary | ICD-10-CM | POA: Diagnosis not present

## 2014-11-30 DIAGNOSIS — Z880 Allergy status to penicillin: Secondary | ICD-10-CM

## 2014-11-30 DIAGNOSIS — F329 Major depressive disorder, single episode, unspecified: Secondary | ICD-10-CM | POA: Diagnosis present

## 2014-11-30 DIAGNOSIS — E119 Type 2 diabetes mellitus without complications: Secondary | ICD-10-CM | POA: Diagnosis not present

## 2014-11-30 DIAGNOSIS — Z87442 Personal history of urinary calculi: Secondary | ICD-10-CM

## 2014-11-30 DIAGNOSIS — N184 Chronic kidney disease, stage 4 (severe): Secondary | ICD-10-CM | POA: Diagnosis present

## 2014-11-30 DIAGNOSIS — R7989 Other specified abnormal findings of blood chemistry: Secondary | ICD-10-CM

## 2014-11-30 DIAGNOSIS — K8020 Calculus of gallbladder without cholecystitis without obstruction: Secondary | ICD-10-CM | POA: Diagnosis not present

## 2014-11-30 DIAGNOSIS — E1121 Type 2 diabetes mellitus with diabetic nephropathy: Secondary | ICD-10-CM | POA: Diagnosis present

## 2014-11-30 DIAGNOSIS — I6789 Other cerebrovascular disease: Secondary | ICD-10-CM | POA: Diagnosis not present

## 2014-11-30 DIAGNOSIS — D638 Anemia in other chronic diseases classified elsewhere: Secondary | ICD-10-CM | POA: Diagnosis present

## 2014-11-30 DIAGNOSIS — E1142 Type 2 diabetes mellitus with diabetic polyneuropathy: Secondary | ICD-10-CM | POA: Diagnosis present

## 2014-11-30 DIAGNOSIS — I452 Bifascicular block: Secondary | ICD-10-CM | POA: Diagnosis present

## 2014-11-30 DIAGNOSIS — E1122 Type 2 diabetes mellitus with diabetic chronic kidney disease: Secondary | ICD-10-CM | POA: Diagnosis present

## 2014-11-30 DIAGNOSIS — K810 Acute cholecystitis: Principal | ICD-10-CM | POA: Diagnosis present

## 2014-11-30 DIAGNOSIS — N400 Enlarged prostate without lower urinary tract symptoms: Secondary | ICD-10-CM | POA: Diagnosis present

## 2014-11-30 DIAGNOSIS — I129 Hypertensive chronic kidney disease with stage 1 through stage 4 chronic kidney disease, or unspecified chronic kidney disease: Secondary | ICD-10-CM | POA: Diagnosis not present

## 2014-11-30 DIAGNOSIS — R41841 Cognitive communication deficit: Secondary | ICD-10-CM | POA: Diagnosis not present

## 2014-11-30 DIAGNOSIS — L97929 Non-pressure chronic ulcer of unspecified part of left lower leg with unspecified severity: Secondary | ICD-10-CM | POA: Diagnosis present

## 2014-11-30 DIAGNOSIS — I1310 Hypertensive heart and chronic kidney disease without heart failure, with stage 1 through stage 4 chronic kidney disease, or unspecified chronic kidney disease: Secondary | ICD-10-CM | POA: Diagnosis present

## 2014-11-30 DIAGNOSIS — I248 Other forms of acute ischemic heart disease: Secondary | ICD-10-CM | POA: Diagnosis present

## 2014-11-30 DIAGNOSIS — K8000 Calculus of gallbladder with acute cholecystitis without obstruction: Secondary | ICD-10-CM | POA: Diagnosis not present

## 2014-11-30 DIAGNOSIS — Z7902 Long term (current) use of antithrombotics/antiplatelets: Secondary | ICD-10-CM

## 2014-11-30 DIAGNOSIS — I119 Hypertensive heart disease without heart failure: Secondary | ICD-10-CM | POA: Diagnosis present

## 2014-11-30 DIAGNOSIS — G40209 Localization-related (focal) (partial) symptomatic epilepsy and epileptic syndromes with complex partial seizures, not intractable, without status epilepticus: Secondary | ICD-10-CM | POA: Diagnosis present

## 2014-11-30 DIAGNOSIS — M6281 Muscle weakness (generalized): Secondary | ICD-10-CM | POA: Diagnosis not present

## 2014-11-30 DIAGNOSIS — R1011 Right upper quadrant pain: Secondary | ICD-10-CM | POA: Diagnosis not present

## 2014-11-30 DIAGNOSIS — I639 Cerebral infarction, unspecified: Secondary | ICD-10-CM | POA: Diagnosis present

## 2014-11-30 DIAGNOSIS — Z7982 Long term (current) use of aspirin: Secondary | ICD-10-CM | POA: Diagnosis not present

## 2014-11-30 DIAGNOSIS — R079 Chest pain, unspecified: Secondary | ICD-10-CM | POA: Diagnosis not present

## 2014-11-30 DIAGNOSIS — E1129 Type 2 diabetes mellitus with other diabetic kidney complication: Secondary | ICD-10-CM | POA: Diagnosis not present

## 2014-11-30 DIAGNOSIS — I2510 Atherosclerotic heart disease of native coronary artery without angina pectoris: Secondary | ICD-10-CM | POA: Diagnosis present

## 2014-11-30 DIAGNOSIS — K5900 Constipation, unspecified: Secondary | ICD-10-CM | POA: Diagnosis not present

## 2014-11-30 DIAGNOSIS — N179 Acute kidney failure, unspecified: Secondary | ICD-10-CM | POA: Diagnosis not present

## 2014-11-30 DIAGNOSIS — I739 Peripheral vascular disease, unspecified: Secondary | ICD-10-CM | POA: Diagnosis not present

## 2014-11-30 DIAGNOSIS — N183 Chronic kidney disease, stage 3 (moderate): Secondary | ICD-10-CM | POA: Diagnosis not present

## 2014-11-30 DIAGNOSIS — Z794 Long term (current) use of insulin: Secondary | ICD-10-CM

## 2014-11-30 DIAGNOSIS — Z01810 Encounter for preprocedural cardiovascular examination: Secondary | ICD-10-CM | POA: Diagnosis not present

## 2014-11-30 DIAGNOSIS — Z886 Allergy status to analgesic agent status: Secondary | ICD-10-CM | POA: Diagnosis not present

## 2014-11-30 DIAGNOSIS — E1165 Type 2 diabetes mellitus with hyperglycemia: Secondary | ICD-10-CM | POA: Diagnosis present

## 2014-11-30 DIAGNOSIS — K819 Cholecystitis, unspecified: Secondary | ICD-10-CM | POA: Diagnosis not present

## 2014-11-30 DIAGNOSIS — Z8673 Personal history of transient ischemic attack (TIA), and cerebral infarction without residual deficits: Secondary | ICD-10-CM

## 2014-11-30 DIAGNOSIS — E039 Hypothyroidism, unspecified: Secondary | ICD-10-CM | POA: Diagnosis present

## 2014-11-30 DIAGNOSIS — E1059 Type 1 diabetes mellitus with other circulatory complications: Secondary | ICD-10-CM | POA: Diagnosis present

## 2014-11-30 DIAGNOSIS — R569 Unspecified convulsions: Secondary | ICD-10-CM | POA: Diagnosis not present

## 2014-11-30 DIAGNOSIS — E1140 Type 2 diabetes mellitus with diabetic neuropathy, unspecified: Secondary | ICD-10-CM | POA: Diagnosis not present

## 2014-11-30 DIAGNOSIS — R2689 Other abnormalities of gait and mobility: Secondary | ICD-10-CM | POA: Diagnosis not present

## 2014-11-30 DIAGNOSIS — J9811 Atelectasis: Secondary | ICD-10-CM | POA: Diagnosis not present

## 2014-11-30 DIAGNOSIS — R293 Abnormal posture: Secondary | ICD-10-CM | POA: Diagnosis not present

## 2014-11-30 DIAGNOSIS — D649 Anemia, unspecified: Secondary | ICD-10-CM | POA: Diagnosis not present

## 2014-11-30 DIAGNOSIS — R778 Other specified abnormalities of plasma proteins: Secondary | ICD-10-CM | POA: Diagnosis present

## 2014-11-30 DIAGNOSIS — K829 Disease of gallbladder, unspecified: Secondary | ICD-10-CM | POA: Diagnosis not present

## 2014-11-30 DIAGNOSIS — I517 Cardiomegaly: Secondary | ICD-10-CM | POA: Diagnosis not present

## 2014-11-30 DIAGNOSIS — R278 Other lack of coordination: Secondary | ICD-10-CM | POA: Diagnosis not present

## 2014-11-30 DIAGNOSIS — R110 Nausea: Secondary | ICD-10-CM | POA: Diagnosis not present

## 2014-11-30 DIAGNOSIS — E782 Mixed hyperlipidemia: Secondary | ICD-10-CM | POA: Diagnosis present

## 2014-11-30 DIAGNOSIS — N189 Chronic kidney disease, unspecified: Secondary | ICD-10-CM | POA: Diagnosis not present

## 2014-11-30 DIAGNOSIS — IMO0002 Reserved for concepts with insufficient information to code with codable children: Secondary | ICD-10-CM | POA: Diagnosis present

## 2014-11-30 HISTORY — DX: Hypertensive heart disease without heart failure: I11.9

## 2014-11-30 LAB — CBC WITH DIFFERENTIAL/PLATELET
Basophils Absolute: 0 10*3/uL (ref 0.0–0.1)
Basophils Relative: 0 % (ref 0–1)
Eosinophils Absolute: 0.1 10*3/uL (ref 0.0–0.7)
Eosinophils Relative: 0 % (ref 0–5)
HCT: 26.8 % — ABNORMAL LOW (ref 39.0–52.0)
Hemoglobin: 8.8 g/dL — ABNORMAL LOW (ref 13.0–17.0)
Lymphocytes Relative: 7 % — ABNORMAL LOW (ref 12–46)
Lymphs Abs: 1.1 10*3/uL (ref 0.7–4.0)
MCH: 30.9 pg (ref 26.0–34.0)
MCHC: 32.8 g/dL (ref 30.0–36.0)
MCV: 94 fL (ref 78.0–100.0)
Monocytes Absolute: 1.2 10*3/uL — ABNORMAL HIGH (ref 0.1–1.0)
Monocytes Relative: 8 % (ref 3–12)
Neutro Abs: 12.9 10*3/uL — ABNORMAL HIGH (ref 1.7–7.7)
Neutrophils Relative %: 85 % — ABNORMAL HIGH (ref 43–77)
Platelets: 213 10*3/uL (ref 150–400)
RBC: 2.85 MIL/uL — ABNORMAL LOW (ref 4.22–5.81)
RDW: 12.7 % (ref 11.5–15.5)
WBC: 15.2 10*3/uL — ABNORMAL HIGH (ref 4.0–10.5)

## 2014-11-30 LAB — COMPREHENSIVE METABOLIC PANEL
ALT: 20 U/L (ref 0–53)
AST: 27 U/L (ref 0–37)
Albumin: 3 g/dL — ABNORMAL LOW (ref 3.5–5.2)
Alkaline Phosphatase: 75 U/L (ref 39–117)
Anion gap: 6 (ref 5–15)
BUN: 53 mg/dL — ABNORMAL HIGH (ref 6–23)
CO2: 21 mmol/L (ref 19–32)
Calcium: 8.3 mg/dL — ABNORMAL LOW (ref 8.4–10.5)
Chloride: 105 mEq/L (ref 96–112)
Creatinine, Ser: 3.24 mg/dL — ABNORMAL HIGH (ref 0.50–1.35)
GFR calc Af Amer: 19 mL/min — ABNORMAL LOW (ref >90)
GFR calc non Af Amer: 17 mL/min — ABNORMAL LOW (ref >90)
Glucose, Bld: 164 mg/dL — ABNORMAL HIGH (ref 70–99)
Potassium: 3.8 mmol/L (ref 3.5–5.1)
Sodium: 132 mmol/L — ABNORMAL LOW (ref 135–145)
Total Bilirubin: 0.4 mg/dL (ref 0.3–1.2)
Total Protein: 6.6 g/dL (ref 6.0–8.3)

## 2014-11-30 LAB — TROPONIN I: Troponin I: 0.05 ng/mL — ABNORMAL HIGH (ref <0.031)

## 2014-11-30 LAB — LIPASE, BLOOD: Lipase: 24 U/L (ref 11–59)

## 2014-11-30 MED ORDER — MORPHINE SULFATE 4 MG/ML IJ SOLN
4.0000 mg | Freq: Once | INTRAMUSCULAR | Status: AC
  Administered 2014-11-30: 4 mg via INTRAVENOUS
  Filled 2014-11-30: qty 1

## 2014-11-30 MED ORDER — SODIUM CHLORIDE 0.9 % IV BOLUS (SEPSIS)
500.0000 mL | Freq: Once | INTRAVENOUS | Status: AC
  Administered 2014-11-30: 500 mL via INTRAVENOUS

## 2014-11-30 MED ORDER — ONDANSETRON HCL 4 MG/2ML IJ SOLN
4.0000 mg | Freq: Once | INTRAMUSCULAR | Status: AC
  Administered 2014-11-30: 4 mg via INTRAVENOUS
  Filled 2014-11-30: qty 2

## 2014-11-30 NOTE — ED Notes (Signed)
Emesis on Monday, generalized weakness since Monday and right flank pain on Monday; denies vomiting today, c/o constipation-LBM Monday

## 2014-11-30 NOTE — ED Provider Notes (Signed)
CSN: [id number]     Arrival date & time 11/30/14  2053 History  This chart was scribed for [employer]. Alvino Chapel, MD by Edison Simon, ED Scribe. This patient was seen in room [facility]06/[facility]06 and the patient's care was started at 10:07 PM.    Chief Complaint  Patient presents with  . Flank Pain   The history is provided by the patient. No language interpreter was used.    HPI Comments: Chase [name] is a 80 y.o. male who presents to the Emergency Department complaining of right lateral flank pain with onset 4 days ago. He reports associated nausea and vomiting 4 days ago; he has also had generalizes weakness, chills, decreased appetite, and constipation. He states his last bowel movement was 4 days ago. He states pain is worse with breathing. He states he has not had similar pain before. He denies diarrhea, chest pain, SOB, or cough.  Past Medical History  Diagnosis Date  . Peripheral vascular disease, unspecified   . Depressive disorder, not elsewhere classified   . Obesity   . Other and unspecified hyperlipidemia   . Pain in joint, lower leg   . Osteoarthrosis, unspecified whether generalized or localized, lower leg   . Derangement of meniscus, not elsewhere classified   . Lumbago   . Spondylosis of unspecified site without mention of myelopathy   . Spinal stenosis, unspecified region other than cervical   . Backache, unspecified   . Family history of diabetes mellitus   . Seizures   . Complete lesion of cervical spinal cord 03/14/3012    Stable since 2006  . Lacunar stroke, acute 03/14/2012  . Bradycardia 03/15/2012  . Gait disorder   . Knee contracture   . Diabetic neuropathy   . CKD (chronic kidney disease) stage 3, GFR 30-59 ml/min   . CKD (chronic kidney disease) stage 4, GFR 15-29 ml/min   . Diabetes mellitus approx 1994  . Hypothyroidism approx 2000  . Unspecified hypothyroidism   . Hypertension approx 1994  . Unspecified essential hypertension   . CVA (cerebrovascular accident)  09/07/12   Past Surgical History  Procedure Laterality Date  . Kidney stones left x2  1975  . Kidney surgery      Ruptured left kidney 30 yrs ago  from a kidney stone  . Colonoscopy N/A 08/10/2013    Procedure: COLONOSCOPY;  Surgeon: Rogene Houston, MD;  Location: [facility];  Service: Endoscopy;  Laterality: N/A;  240   Family History  Problem Relation Age of Onset  . Diabetes Mother   . Prostate cancer Father   . Diabetes Brother   . Diabetes Brother   . Hypertension Brother   . Hypertension Brother   . Hypertension Brother    History  Substance Use Topics  . Smoking status: Never Smoker   . Smokeless tobacco: Never Used  . Alcohol Use: No    Review of Systems  Constitutional: Positive for chills and appetite change.  Respiratory: Negative for cough and shortness of breath.   Cardiovascular: Negative for chest pain.  Gastrointestinal: Positive for nausea, vomiting and constipation. Negative for diarrhea.  Genitourinary: Positive for flank pain.  Neurological: Positive for weakness.  All other systems reviewed and are negative.     Allergies  Bayer advanced aspirin and Penicillins  Home Medications   Prior to Admission medications   Medication Sig Start Date End Date Taking? Authorizing Provider  amLODipine (NORVASC) 10 MG tablet TAKE ONE TABLET BY MOUTH ONCE DAILY 07/26/14  [name], MD  aspirin EC 81 MG tablet Take 1 tablet (81 mg total) by mouth daily. 06/16/13   [name], MD  chlorthalidone (HYGROTON) 50 MG tablet Take 1 tablet (50 mg total) by mouth daily. 06/12/14   [name], MD  clopidogrel (PLAVIX) 75 MG tablet TAKE ONE TABLET BY MOUTH IN THE MORNING WITH BREAKFAST 02/27/14   [name], MD  hydrALAZINE (APRESOLINE) 25 MG tablet  10/09/14   Historical Provider, MD  hydrALAZINE (APRESOLINE) 50 MG tablet Take 1 tablet (50 mg total) by mouth 2 (two) times daily. 10/16/14   Herminio Commons, MD  insulin lispro  protamine-insulin lispro (HUMALOG 75/25) (75-25) 100 UNIT/ML SUSP Inject 10 Units into the skin 2 (two) times daily. As directed    Historical Provider, MD  levETIRAcetam (KEPPRA) 500 MG tablet Take 250 mg by mouth at bedtime. Take ONE_HALF TAB BY MOUTH AT BEDTIME    Historical Provider, MD  levothyroxine (SYNTHROID, LEVOTHROID) 175 MCG tablet Take 175 mcg by mouth daily before breakfast.  04/27/14   Historical Provider, MD  lovastatin (MEVACOR) 40 MG tablet TAKE ONE TABLET BY MOUTH AT BEDTIME 07/26/14   [name], MD  Multiple Vitamins-Minerals (EYE VITAMINS PO) Take 1 tablet by mouth daily.     Historical Provider, MD  tamsulosin (FLOMAX) 0.4 MG CAPS capsule Take 0.4 mg by mouth.    Historical Provider, MD  Vitamin D, Ergocalciferol, (DRISDOL) 50000 UNITS CAPS capsule Take 50,000 Units by mouth every 7 (seven) days.  04/27/14   Historical Provider, MD   BP 128/68 mmHg  Pulse 76  Temp(Src) 99.7 F (37.6 C) (Oral)  Resp 14  Ht 5\' 8"  (1.727 m)  Wt 189 lb (85.73 kg)  BMI 28.74 kg/m2  SpO2 97% Physical Exam  Constitutional: He is oriented to person, place, and time. He appears well-developed and well-nourished.  HENT:  Head: Normocephalic and atraumatic.  Foul smelling breath  Eyes: Conjunctivae are normal.  Neck: Normal range of motion. Neck supple.  Pulmonary/Chest: Effort normal and breath sounds normal. No respiratory distress. He has no wheezes. He has no rales.  Abdominal: Soft. He exhibits no distension. There is tenderness. There is no rebound and no guarding.  Moderate RUQ tenderness  Musculoskeletal: Normal range of motion. He exhibits no edema.  No peripheral edema  Neurological: He is alert and oriented to person, place, and time.  Skin: Skin is warm and dry.  Psychiatric: He has a normal mood and affect.  Nursing note and vitals reviewed.   ED Course  Procedures (including critical care time)  COORDINATION OF CARE: 10:11 PM Discussed treatment plan with patient  at beside, the patient agrees with the plan and has no further questions at this time.   Labs Review Labs Reviewed  CBC WITH DIFFERENTIAL - Abnormal; Notable for the following:    WBC 15.2 (*)    RBC 2.85 (*)    Hemoglobin 8.8 (*)    HCT 26.8 (*)    Neutrophils Relative % 85 (*)    Neutro Abs 12.9 (*)    Lymphocytes Relative 7 (*)    Monocytes Absolute 1.2 (*)    All other components within normal limits  COMPREHENSIVE METABOLIC PANEL  LIPASE, BLOOD  URINALYSIS, ROUTINE W REFLEX MICROSCOPIC  TROPONIN I    Imaging Review No results found.   EKG Interpretation None      MDM   Final diagnoses:  Chest pain    Right-sided abdominal pain. Moderate  tenderness. White count is up at 15. Will likely require a CT scan but lab work is pending at this time.  I personally performed the services described in this documentation, which was scribed in my presence. The recorded information has been reviewed and is accurate.     Jasper Riling. Alvino Chapel, MD 11/30/14 [phone]

## 2014-12-01 ENCOUNTER — Encounter (HOSPITAL_COMMUNITY): Payer: Self-pay | Admitting: Internal Medicine

## 2014-12-01 ENCOUNTER — Emergency Department (HOSPITAL_COMMUNITY): Payer: Medicare Other

## 2014-12-01 DIAGNOSIS — N189 Chronic kidney disease, unspecified: Secondary | ICD-10-CM | POA: Diagnosis not present

## 2014-12-01 DIAGNOSIS — Z880 Allergy status to penicillin: Secondary | ICD-10-CM | POA: Diagnosis not present

## 2014-12-01 DIAGNOSIS — K810 Acute cholecystitis: Secondary | ICD-10-CM | POA: Diagnosis present

## 2014-12-01 DIAGNOSIS — E1165 Type 2 diabetes mellitus with hyperglycemia: Secondary | ICD-10-CM | POA: Diagnosis not present

## 2014-12-01 DIAGNOSIS — I6789 Other cerebrovascular disease: Secondary | ICD-10-CM | POA: Diagnosis not present

## 2014-12-01 DIAGNOSIS — Z794 Long term (current) use of insulin: Secondary | ICD-10-CM | POA: Diagnosis not present

## 2014-12-01 DIAGNOSIS — I119 Hypertensive heart disease without heart failure: Secondary | ICD-10-CM | POA: Diagnosis present

## 2014-12-01 DIAGNOSIS — R41841 Cognitive communication deficit: Secondary | ICD-10-CM | POA: Diagnosis not present

## 2014-12-01 DIAGNOSIS — D638 Anemia in other chronic diseases classified elsewhere: Secondary | ICD-10-CM | POA: Diagnosis present

## 2014-12-01 DIAGNOSIS — R079 Chest pain, unspecified: Secondary | ICD-10-CM | POA: Diagnosis not present

## 2014-12-01 DIAGNOSIS — N184 Chronic kidney disease, stage 4 (severe): Secondary | ICD-10-CM | POA: Diagnosis not present

## 2014-12-01 DIAGNOSIS — K829 Disease of gallbladder, unspecified: Secondary | ICD-10-CM | POA: Diagnosis not present

## 2014-12-01 DIAGNOSIS — L97929 Non-pressure chronic ulcer of unspecified part of left lower leg with unspecified severity: Secondary | ICD-10-CM | POA: Diagnosis present

## 2014-12-01 DIAGNOSIS — K8000 Calculus of gallbladder with acute cholecystitis without obstruction: Secondary | ICD-10-CM | POA: Diagnosis not present

## 2014-12-01 DIAGNOSIS — R778 Other specified abnormalities of plasma proteins: Secondary | ICD-10-CM | POA: Diagnosis present

## 2014-12-01 DIAGNOSIS — E119 Type 2 diabetes mellitus without complications: Secondary | ICD-10-CM

## 2014-12-01 DIAGNOSIS — K819 Cholecystitis, unspecified: Secondary | ICD-10-CM | POA: Diagnosis not present

## 2014-12-01 DIAGNOSIS — E785 Hyperlipidemia, unspecified: Secondary | ICD-10-CM | POA: Diagnosis not present

## 2014-12-01 DIAGNOSIS — R7989 Other specified abnormal findings of blood chemistry: Secondary | ICD-10-CM | POA: Diagnosis not present

## 2014-12-01 DIAGNOSIS — Z8673 Personal history of transient ischemic attack (TIA), and cerebral infarction without residual deficits: Secondary | ICD-10-CM | POA: Diagnosis not present

## 2014-12-01 DIAGNOSIS — E1140 Type 2 diabetes mellitus with diabetic neuropathy, unspecified: Secondary | ICD-10-CM | POA: Diagnosis not present

## 2014-12-01 DIAGNOSIS — I739 Peripheral vascular disease, unspecified: Secondary | ICD-10-CM | POA: Diagnosis not present

## 2014-12-01 DIAGNOSIS — R1011 Right upper quadrant pain: Secondary | ICD-10-CM | POA: Diagnosis present

## 2014-12-01 DIAGNOSIS — R278 Other lack of coordination: Secondary | ICD-10-CM | POA: Diagnosis not present

## 2014-12-01 DIAGNOSIS — E1129 Type 2 diabetes mellitus with other diabetic kidney complication: Secondary | ICD-10-CM | POA: Diagnosis not present

## 2014-12-01 DIAGNOSIS — D649 Anemia, unspecified: Secondary | ICD-10-CM | POA: Diagnosis not present

## 2014-12-01 DIAGNOSIS — Z87442 Personal history of urinary calculi: Secondary | ICD-10-CM | POA: Diagnosis not present

## 2014-12-01 DIAGNOSIS — E039 Hypothyroidism, unspecified: Secondary | ICD-10-CM | POA: Diagnosis not present

## 2014-12-01 DIAGNOSIS — Z7982 Long term (current) use of aspirin: Secondary | ICD-10-CM | POA: Diagnosis not present

## 2014-12-01 DIAGNOSIS — R110 Nausea: Secondary | ICD-10-CM | POA: Diagnosis not present

## 2014-12-01 DIAGNOSIS — I10 Essential (primary) hypertension: Secondary | ICD-10-CM

## 2014-12-01 DIAGNOSIS — G40209 Localization-related (focal) (partial) symptomatic epilepsy and epileptic syndromes with complex partial seizures, not intractable, without status epilepticus: Secondary | ICD-10-CM | POA: Diagnosis present

## 2014-12-01 DIAGNOSIS — K8020 Calculus of gallbladder without cholecystitis without obstruction: Secondary | ICD-10-CM | POA: Diagnosis not present

## 2014-12-01 DIAGNOSIS — F329 Major depressive disorder, single episode, unspecified: Secondary | ICD-10-CM | POA: Diagnosis present

## 2014-12-01 DIAGNOSIS — N179 Acute kidney failure, unspecified: Secondary | ICD-10-CM

## 2014-12-01 DIAGNOSIS — M6281 Muscle weakness (generalized): Secondary | ICD-10-CM | POA: Diagnosis not present

## 2014-12-01 DIAGNOSIS — R293 Abnormal posture: Secondary | ICD-10-CM | POA: Diagnosis not present

## 2014-12-01 DIAGNOSIS — E1142 Type 2 diabetes mellitus with diabetic polyneuropathy: Secondary | ICD-10-CM | POA: Diagnosis present

## 2014-12-01 DIAGNOSIS — Z7902 Long term (current) use of antithrombotics/antiplatelets: Secondary | ICD-10-CM | POA: Diagnosis not present

## 2014-12-01 DIAGNOSIS — R569 Unspecified convulsions: Secondary | ICD-10-CM | POA: Diagnosis not present

## 2014-12-01 DIAGNOSIS — E1122 Type 2 diabetes mellitus with diabetic chronic kidney disease: Secondary | ICD-10-CM | POA: Diagnosis present

## 2014-12-01 DIAGNOSIS — I1310 Hypertensive heart and chronic kidney disease without heart failure, with stage 1 through stage 4 chronic kidney disease, or unspecified chronic kidney disease: Secondary | ICD-10-CM | POA: Diagnosis present

## 2014-12-01 DIAGNOSIS — I452 Bifascicular block: Secondary | ICD-10-CM | POA: Diagnosis present

## 2014-12-01 DIAGNOSIS — I248 Other forms of acute ischemic heart disease: Secondary | ICD-10-CM | POA: Diagnosis present

## 2014-12-01 DIAGNOSIS — N400 Enlarged prostate without lower urinary tract symptoms: Secondary | ICD-10-CM | POA: Diagnosis present

## 2014-12-01 DIAGNOSIS — R2689 Other abnormalities of gait and mobility: Secondary | ICD-10-CM | POA: Diagnosis not present

## 2014-12-01 DIAGNOSIS — I2510 Atherosclerotic heart disease of native coronary artery without angina pectoris: Secondary | ICD-10-CM | POA: Diagnosis present

## 2014-12-01 DIAGNOSIS — Z886 Allergy status to analgesic agent status: Secondary | ICD-10-CM | POA: Diagnosis not present

## 2014-12-01 DIAGNOSIS — N183 Chronic kidney disease, stage 3 (moderate): Secondary | ICD-10-CM | POA: Diagnosis not present

## 2014-12-01 DIAGNOSIS — Z01810 Encounter for preprocedural cardiovascular examination: Secondary | ICD-10-CM | POA: Diagnosis not present

## 2014-12-01 DIAGNOSIS — I129 Hypertensive chronic kidney disease with stage 1 through stage 4 chronic kidney disease, or unspecified chronic kidney disease: Secondary | ICD-10-CM | POA: Diagnosis not present

## 2014-12-01 DIAGNOSIS — K5900 Constipation, unspecified: Secondary | ICD-10-CM | POA: Diagnosis not present

## 2014-12-01 LAB — COMPREHENSIVE METABOLIC PANEL
ALT: 22 U/L (ref 0–53)
AST: 26 U/L (ref 0–37)
Albumin: 2.9 g/dL — ABNORMAL LOW (ref 3.5–5.2)
Alkaline Phosphatase: 74 U/L (ref 39–117)
Anion gap: 8 (ref 5–15)
BUN: 52 mg/dL — ABNORMAL HIGH (ref 6–23)
CO2: 21 mmol/L (ref 19–32)
Calcium: 8.4 mg/dL (ref 8.4–10.5)
Chloride: 105 mEq/L (ref 96–112)
Creatinine, Ser: 3.04 mg/dL — ABNORMAL HIGH (ref 0.50–1.35)
GFR calc Af Amer: 21 mL/min — ABNORMAL LOW (ref >90)
GFR calc non Af Amer: 18 mL/min — ABNORMAL LOW (ref >90)
Glucose, Bld: 141 mg/dL — ABNORMAL HIGH (ref 70–99)
Potassium: 3.8 mmol/L (ref 3.5–5.1)
Sodium: 134 mmol/L — ABNORMAL LOW (ref 135–145)
Total Bilirubin: 0.6 mg/dL (ref 0.3–1.2)
Total Protein: 6.5 g/dL (ref 6.0–8.3)

## 2014-12-01 LAB — CBC WITH DIFFERENTIAL/PLATELET
Basophils Absolute: 0 10*3/uL (ref 0.0–0.1)
Basophils Relative: 0 % (ref 0–1)
Eosinophils Absolute: 0.1 10*3/uL (ref 0.0–0.7)
Eosinophils Relative: 1 % (ref 0–5)
HCT: 28 % — ABNORMAL LOW (ref 39.0–52.0)
Hemoglobin: 9.2 g/dL — ABNORMAL LOW (ref 13.0–17.0)
Lymphocytes Relative: 7 % — ABNORMAL LOW (ref 12–46)
Lymphs Abs: 1 10*3/uL (ref 0.7–4.0)
MCH: 31 pg (ref 26.0–34.0)
MCHC: 32.9 g/dL (ref 30.0–36.0)
MCV: 94.3 fL (ref 78.0–100.0)
Monocytes Absolute: 1.5 10*3/uL — ABNORMAL HIGH (ref 0.1–1.0)
Monocytes Relative: 10 % (ref 3–12)
Neutro Abs: 12.2 10*3/uL — ABNORMAL HIGH (ref 1.7–7.7)
Neutrophils Relative %: 82 % — ABNORMAL HIGH (ref 43–77)
Platelets: 220 10*3/uL (ref 150–400)
RBC: 2.97 MIL/uL — ABNORMAL LOW (ref 4.22–5.81)
RDW: 12.7 % (ref 11.5–15.5)
WBC: 14.8 10*3/uL — ABNORMAL HIGH (ref 4.0–10.5)

## 2014-12-01 LAB — TYPE AND SCREEN
ABO/RH(D): O POS
Antibody Screen: NEGATIVE

## 2014-12-01 LAB — GLUCOSE, CAPILLARY
Glucose-Capillary: 135 mg/dL — ABNORMAL HIGH (ref 70–99)
Glucose-Capillary: 113 mg/dL — ABNORMAL HIGH (ref 70–99)
Glucose-Capillary: 147 mg/dL — ABNORMAL HIGH (ref 70–99)
Glucose-Capillary: 127 mg/dL — ABNORMAL HIGH (ref 70–99)
Glucose-Capillary: 190 mg/dL — ABNORMAL HIGH (ref 70–99)
Glucose-Capillary: 124 mg/dL — ABNORMAL HIGH (ref 70–99)

## 2014-12-01 LAB — URINALYSIS, ROUTINE W REFLEX MICROSCOPIC
Bilirubin Urine: NEGATIVE
Glucose, UA: NEGATIVE mg/dL
Hgb urine dipstick: NEGATIVE
Ketones, ur: NEGATIVE mg/dL
Leukocytes, UA: NEGATIVE
Nitrite: NEGATIVE
Protein, ur: 100 mg/dL — AB
Specific Gravity, Urine: 1.015 (ref 1.005–1.030)
Urobilinogen, UA: 0.2 mg/dL (ref 0.0–1.0)
pH: 6 (ref 5.0–8.0)

## 2014-12-01 LAB — TROPONIN I
Troponin I: 0.04 ng/mL — ABNORMAL HIGH (ref <0.031)
Troponin I: 0.04 ng/mL — ABNORMAL HIGH (ref <0.031)
Troponin I: 0.07 ng/mL — ABNORMAL HIGH (ref <0.031)
Troponin I: 0.05 ng/mL — ABNORMAL HIGH (ref <0.031)

## 2014-12-01 LAB — URINE MICROSCOPIC-ADD ON

## 2014-12-01 MED ORDER — METRONIDAZOLE IN NACL 5-0.79 MG/ML-% IV SOLN: 500.0000 mg | Freq: Once | INTRAVENOUS | Status: DC

## 2014-12-01 MED ORDER — INSULIN ASPART 100 UNIT/ML ~~LOC~~ SOLN
0.0000 [IU] | SUBCUTANEOUS | Status: DC
Start: 2014-12-01 — End: 2014-12-06
  Administered 2014-12-01: 2 [IU] via SUBCUTANEOUS
  Administered 2014-12-01 – 2014-12-04 (×9): 1 [IU] via SUBCUTANEOUS
  Administered 2014-12-05: 2 [IU] via SUBCUTANEOUS
  Administered 2014-12-05: 1 [IU] via SUBCUTANEOUS
  Administered 2014-12-05: 2 [IU] via SUBCUTANEOUS
  Administered 2014-12-06 (×2): 1 [IU] via SUBCUTANEOUS
  Administered 2014-12-06: 5 [IU] via SUBCUTANEOUS

## 2014-12-01 MED ORDER — SODIUM CHLORIDE 0.9 % IV SOLN: INTRAVENOUS | Status: DC

## 2014-12-01 MED ORDER — ACETAMINOPHEN 650 MG RE SUPP: 650.0000 mg | Freq: Four times a day (QID) | RECTAL | Status: DC | PRN

## 2014-12-01 MED ORDER — ONDANSETRON HCL 4 MG PO TABS: 4.0000 mg | ORAL_TABLET | Freq: Four times a day (QID) | ORAL | Status: DC | PRN

## 2014-12-01 MED ORDER — HYDRALAZINE HCL 20 MG/ML IJ SOLN: 10.0000 mg | INTRAMUSCULAR | Status: DC | PRN

## 2014-12-01 MED ORDER — SODIUM CHLORIDE 0.9 % IV SOLN
INTRAVENOUS | Status: AC
  Administered 2014-12-01: 05:00:00 via INTRAVENOUS

## 2014-12-01 MED ORDER — ONDANSETRON HCL 4 MG/2ML IJ SOLN: 4.0000 mg | Freq: Four times a day (QID) | INTRAMUSCULAR | Status: DC | PRN

## 2014-12-01 MED ORDER — MORPHINE SULFATE 2 MG/ML IJ SOLN
1.0000 mg | INTRAMUSCULAR | Status: DC | PRN
  Administered 2014-12-01 – 2014-12-05 (×6): 1 mg via INTRAVENOUS
  Filled 2014-12-01 (×6): qty 1

## 2014-12-01 MED ORDER — CIPROFLOXACIN IN D5W 400 MG/200ML IV SOLN
400.0000 mg | Freq: Once | INTRAVENOUS | Status: AC
  Administered 2014-12-01: 400 mg via INTRAVENOUS
  Filled 2014-12-01: qty 200

## 2014-12-01 MED ORDER — CIPROFLOXACIN IN D5W 400 MG/200ML IV SOLN
400.0000 mg | INTRAVENOUS | Status: DC
  Administered 2014-12-02 – 2014-12-06 (×5): 400 mg via INTRAVENOUS
  Filled 2014-12-01 (×5): qty 200

## 2014-12-01 MED ORDER — LEVOTHYROXINE SODIUM 100 MCG IV SOLR
87.5000 ug | Freq: Every day | INTRAVENOUS | Status: DC
  Administered 2014-12-01 – 2014-12-05 (×5): 87.5 ug via INTRAVENOUS
  Filled 2014-12-01 (×8): qty 5

## 2014-12-01 MED ORDER — SODIUM CHLORIDE 0.9 % IV SOLN
250.0000 mg | Freq: Every day | INTRAVENOUS | Status: DC
  Administered 2014-12-01 – 2014-12-04 (×4): 250 mg via INTRAVENOUS
  Filled 2014-12-01 (×6): qty 2.5

## 2014-12-01 MED ORDER — METRONIDAZOLE IN NACL 5-0.79 MG/ML-% IV SOLN
500.0000 mg | Freq: Three times a day (TID) | INTRAVENOUS | Status: DC
  Administered 2014-12-01 – 2014-12-06 (×16): 500 mg via INTRAVENOUS
  Filled 2014-12-01 (×19): qty 100

## 2014-12-01 MED ORDER — ACETAMINOPHEN 325 MG PO TABS: 650.0000 mg | ORAL_TABLET | Freq: Four times a day (QID) | ORAL | Status: DC | PRN

## 2014-12-01 NOTE — Consult Note (Addendum)
Reason for Consult:cholecystitis Referring Physician: Dr Dairl Ponder is an 80 y.o. male.  HPI: 80 year old African-American male with chronic kidney disease, history of CVA, hypertension, diabetes mellitus who was admitted to [facility] yesterday with acute cholecystitis who was transferred to, early this evening for surgical care per family request. The patient states he has not been feeling well since this past Monday. He states he developed upper abdominal pain and right-sided abdominal pain on Monday. It is been constant. If he lays still it doesn't really bother him. He denies any prior symptoms. He has had several episodes of nausea and vomiting. His oral intake has gone down as well. He states he hasn't had a bowel movement since Monday. He reports flatus. He normally does not have a problem with constipation. He denies any jaundice. He denies any prior abdominal surgery.  He does take Plavix. At [facility] he was found to have an elevated creatinine above his baseline. His baseline creatinine is around 2.5-2.8. He was also found to have a small elevation in his troponin. He underwent an echocardiogram which showed normal ejection fraction with LVH He normally uses a cane to walk around.  Past Medical History  Diagnosis Date  . Peripheral vascular disease, unspecified   . Depressive disorder, not elsewhere classified   . Obesity   . Other and unspecified hyperlipidemia   . Pain in joint, lower leg   . Osteoarthrosis, unspecified whether generalized or localized, lower leg   . Derangement of meniscus, not elsewhere classified   . Lumbago   . Spondylosis of unspecified site without mention of myelopathy   . Spinal stenosis, unspecified region other than cervical   . Backache, unspecified   . Family history of diabetes mellitus   . Seizures   . Complete lesion of cervical spinal cord 03/14/3012    Stable since 2006  . Lacunar stroke, acute 03/14/2012  . Bradycardia 03/15/2012    . Gait disorder   . Knee contracture   . Diabetic neuropathy   . CKD (chronic kidney disease) stage 3, GFR 30-59 ml/min   . CKD (chronic kidney disease) stage 4, GFR 15-29 ml/min   . Diabetes mellitus approx 1994  . Hypothyroidism approx 2000  . Unspecified hypothyroidism   . Hypertension approx 1994  . Unspecified essential hypertension   . CVA (cerebrovascular accident) 09/07/12    Past Surgical History  Procedure Laterality Date  . Kidney stones left x2  1975  . Kidney surgery      Ruptured left kidney 30 yrs ago  from a kidney stone  . Colonoscopy N/A 08/10/2013    Procedure: COLONOSCOPY;  Surgeon: Rogene Houston, MD;  Location: [facility];  Service: Endoscopy;  Laterality: N/A;  240    Family History  Problem Relation Age of Onset  . Diabetes Mother   . Prostate cancer Father   . Diabetes Brother   . Diabetes Brother   . Hypertension Brother   . Hypertension Brother   . Hypertension Brother     Social History:  reports that he has never smoked. He has never used smokeless tobacco. He reports that he does not drink alcohol or use illicit drugs.  Allergies:  Allergies  Allergen Reactions  . Bayer Advanced Aspirin [Aspirin] Nausea And Vomiting  . Penicillins Nausea And Vomiting    Medications: I have reviewed the patient's current medications.  Results for orders placed or performed during the hospital encounter of 11/30/14 (from the past 48 hour(s))  CBC with Differential     Status: Abnormal   Collection Time: 11/30/14 11:07 PM  Result Value Ref Range   WBC 15.2 (H) 4.0 - 10.5 K/uL   RBC 2.85 (L) 4.22 - 5.81 MIL/uL   Hemoglobin 8.8 (L) 13.0 - 17.0 g/dL   HCT 26.8 (L) 39.0 - 52.0 %   MCV 94.0 78.0 - 100.0 fL   MCH 30.9 26.0 - 34.0 pg   MCHC 32.8 30.0 - 36.0 g/dL   RDW 12.7 11.5 - 15.5 %   Platelets 213 150 - 400 K/uL   Neutrophils Relative % 85 (H) 43 - 77 %   Neutro Abs 12.9 (H) 1.7 - 7.7 K/uL   Lymphocytes Relative 7 (L) 12 - 46 %   Lymphs Abs 1.1  0.7 - 4.0 K/uL   Monocytes Relative 8 3 - 12 %   Monocytes Absolute 1.2 (H) 0.1 - 1.0 K/uL   Eosinophils Relative 0 0 - 5 %   Eosinophils Absolute 0.1 0.0 - 0.7 K/uL   Basophils Relative 0 0 - 1 %   Basophils Absolute 0.0 0.0 - 0.1 K/uL  Comprehensive metabolic panel     Status: Abnormal   Collection Time: 11/30/14 11:07 PM  Result Value Ref Range   Sodium 132 (L) 135 - 145 mmol/L    Comment: Please note change in reference range.   Potassium 3.8 3.5 - 5.1 mmol/L    Comment: Please note change in reference range.   Chloride 105 96 - 112 mEq/L   CO2 21 19 - 32 mmol/L   Glucose, Bld 164 (H) 70 - 99 mg/dL   BUN 53 (H) 6 - 23 mg/dL   Creatinine, Ser 3.24 (H) 0.50 - 1.35 mg/dL   Calcium 8.3 (L) 8.4 - 10.5 mg/dL   Total Protein 6.6 6.0 - 8.3 g/dL   Albumin 3.0 (L) 3.5 - 5.2 g/dL   AST 27 0 - 37 U/L   ALT 20 0 - 53 U/L   Alkaline Phosphatase 75 39 - 117 U/L   Total Bilirubin 0.4 0.3 - 1.2 mg/dL   GFR calc non Af Amer 17 (L) >90 mL/min   GFR calc Af Amer 19 (L) >90 mL/min    Comment: (NOTE) The eGFR has been calculated using the CKD EPI equation. This calculation has not been validated in all clinical situations. eGFR's persistently <90 mL/min signify possible Chronic Kidney Disease.    Anion gap 6 5 - 15  Lipase, blood     Status: None   Collection Time: 11/30/14 11:07 PM  Result Value Ref Range   Lipase 24 11 - 59 U/L  Troponin I     Status: Abnormal   Collection Time: 11/30/14 11:07 PM  Result Value Ref Range   Troponin I 0.05 (H) <0.031 ng/mL    Comment:        PERSISTENTLY INCREASED TROPONIN VALUES IN THE RANGE OF 0.04-0.49 ng/mL CAN BE SEEN IN:       -UNSTABLE ANGINA       -CONGESTIVE HEART FAILURE       -MYOCARDITIS       -CHEST TRAUMA       -ARRYHTHMIAS       -LATE PRESENTING MYOCARDIAL INFARCTION       -COPD   CLINICAL FOLLOW-UP RECOMMENDED. Please note change in reference range.   Urinalysis, Routine w reflex microscopic     Status: Abnormal   Collection  Time: 11/30/14 11:59 PM  Result Value Ref Range  Color, Urine YELLOW YELLOW   APPearance CLEAR CLEAR   Specific Gravity, Urine 1.015 1.005 - 1.030   pH 6.0 5.0 - 8.0   Glucose, UA NEGATIVE NEGATIVE mg/dL   Hgb urine dipstick NEGATIVE NEGATIVE   Bilirubin Urine NEGATIVE NEGATIVE   Ketones, ur NEGATIVE NEGATIVE mg/dL   Protein, ur 100 (A) NEGATIVE mg/dL   Urobilinogen, UA 0.2 0.0 - 1.0 mg/dL   Nitrite NEGATIVE NEGATIVE   Leukocytes, UA NEGATIVE NEGATIVE  Urine microscopic-add on     Status: None   Collection Time: 11/30/14 11:59 PM  Result Value Ref Range   Squamous Epithelial / LPF RARE RARE   WBC, UA 0-2 <3 WBC/hpf   RBC / HPF 0-2 <3 RBC/hpf   Bacteria, UA RARE RARE  Glucose, capillary     Status: Abnormal   Collection Time: 12/01/14  4:14 AM  Result Value Ref Range   Glucose-Capillary 135 (H) 70 - 99 mg/dL   Comment 1 Notify RN   Type and screen     Status: None   Collection Time: 12/01/14  4:22 AM  Result Value Ref Range   ABO/RH(D) O POS    Antibody Screen NEG    Sample Expiration 12/04/2014   Troponin I (q 6hr x 3)     Status: Abnormal   Collection Time: 12/01/14  4:34 AM  Result Value Ref Range   Troponin I 0.04 (H) <0.031 ng/mL    Comment:        PERSISTENTLY INCREASED TROPONIN VALUES IN THE RANGE OF 0.04-0.49 ng/mL CAN BE SEEN IN:       -UNSTABLE ANGINA       -CONGESTIVE HEART FAILURE       -MYOCARDITIS       -CHEST TRAUMA       -ARRYHTHMIAS       -LATE PRESENTING MYOCARDIAL INFARCTION       -COPD   CLINICAL FOLLOW-UP RECOMMENDED. Please note change in reference range.   Comprehensive metabolic panel     Status: Abnormal   Collection Time: 12/01/14  4:34 AM  Result Value Ref Range   Sodium 134 (L) 135 - 145 mmol/L    Comment: Please note change in reference range.   Potassium 3.8 3.5 - 5.1 mmol/L    Comment: Please note change in reference range.   Chloride 105 96 - 112 mEq/L   CO2 21 19 - 32 mmol/L   Glucose, Bld 141 (H) 70 - 99 mg/dL   BUN 52 (H)  6 - 23 mg/dL   Creatinine, Ser 3.04 (H) 0.50 - 1.35 mg/dL   Calcium 8.4 8.4 - 10.5 mg/dL   Total Protein 6.5 6.0 - 8.3 g/dL   Albumin 2.9 (L) 3.5 - 5.2 g/dL   AST 26 0 - 37 U/L   ALT 22 0 - 53 U/L   Alkaline Phosphatase 74 39 - 117 U/L   Total Bilirubin 0.6 0.3 - 1.2 mg/dL   GFR calc non Af Amer 18 (L) >90 mL/min   GFR calc Af Amer 21 (L) >90 mL/min    Comment: (NOTE) The eGFR has been calculated using the CKD EPI equation. This calculation has not been validated in all clinical situations. eGFR's persistently <90 mL/min signify possible Chronic Kidney Disease.    Anion gap 8 5 - 15  CBC with Differential     Status: Abnormal   Collection Time: 12/01/14  4:34 AM  Result Value Ref Range   WBC 14.8 (H) 4.0 - 10.5 K/uL  RBC 2.97 (L) 4.22 - 5.81 MIL/uL   Hemoglobin 9.2 (L) 13.0 - 17.0 g/dL   HCT 28.0 (L) 39.0 - 52.0 %   MCV 94.3 78.0 - 100.0 fL   MCH 31.0 26.0 - 34.0 pg   MCHC 32.9 30.0 - 36.0 g/dL   RDW 12.7 11.5 - 15.5 %   Platelets 220 150 - 400 K/uL   Neutrophils Relative % 82 (H) 43 - 77 %   Neutro Abs 12.2 (H) 1.7 - 7.7 K/uL   Lymphocytes Relative 7 (L) 12 - 46 %   Lymphs Abs 1.0 0.7 - 4.0 K/uL   Monocytes Relative 10 3 - 12 %   Monocytes Absolute 1.5 (H) 0.1 - 1.0 K/uL   Eosinophils Relative 1 0 - 5 %   Eosinophils Absolute 0.1 0.0 - 0.7 K/uL   Basophils Relative 0 0 - 1 %   Basophils Absolute 0.0 0.0 - 0.1 K/uL  Glucose, capillary     Status: Abnormal   Collection Time: 12/01/14  7:42 AM  Result Value Ref Range   Glucose-Capillary 113 (H) 70 - 99 mg/dL  Troponin I (q 6hr x 3)     Status: Abnormal   Collection Time: 12/01/14  9:56 AM  Result Value Ref Range   Troponin I 0.04 (H) <0.031 ng/mL    Comment:        PERSISTENTLY INCREASED TROPONIN VALUES IN THE RANGE OF 0.04-0.49 ng/mL CAN BE SEEN IN:       -UNSTABLE ANGINA       -CONGESTIVE HEART FAILURE       -MYOCARDITIS       -CHEST TRAUMA       -ARRYHTHMIAS       -LATE PRESENTING MYOCARDIAL INFARCTION        -COPD   CLINICAL FOLLOW-UP RECOMMENDED. Please note change in reference range.   Glucose, capillary     Status: Abnormal   Collection Time: 12/01/14 12:14 PM  Result Value Ref Range   Glucose-Capillary 147 (H) 70 - 99 mg/dL  Troponin I (q 6hr x 3)     Status: Abnormal   Collection Time: 12/01/14  3:58 PM  Result Value Ref Range   Troponin I 0.07 (H) <0.031 ng/mL    Comment:        PERSISTENTLY INCREASED TROPONIN VALUES IN THE RANGE OF 0.04-0.49 ng/mL CAN BE SEEN IN:       -UNSTABLE ANGINA       -CONGESTIVE HEART FAILURE       -MYOCARDITIS       -CHEST TRAUMA       -ARRYHTHMIAS       -LATE PRESENTING MYOCARDIAL INFARCTION       -COPD   CLINICAL FOLLOW-UP RECOMMENDED. Please note change in reference range.   Glucose, capillary     Status: Abnormal   Collection Time: 12/01/14  4:36 PM  Result Value Ref Range   Glucose-Capillary 127 (H) 70 - 99 mg/dL    Ct Abdomen Pelvis Wo Contrast  12/01/2014   CLINICAL DATA:  Acute onset of abdominal pain and constipation for 3 days. Nausea. Initial encounter.  EXAM: CT ABDOMEN AND PELVIS WITHOUT CONTRAST  TECHNIQUE: Multidetector CT imaging of the abdomen and pelvis was performed following the standard protocol without IV contrast.  COMPARISON:  CT of the abdomen and pelvis from 08/10/2012, and renal ultrasound performed 03/20/2014  FINDINGS: Bibasilar atelectasis is noted, more prominent on the right, with trace right-sided pleural fluid.  A calcified granuloma is noted  within the right hepatic lobe. The spleen is unremarkable.  There is mild soft tissue inflammation about the gallbladder, raising concern for mild acute cholecystitis. Stones are noted within the gallbladder. There is a 1.2 cm stone lodged at the neck of the gallbladder. No intrahepatic biliary ductal dilatation is seen. The common hepatic duct remains normal in caliber.  The pancreas and adrenal glands are unremarkable.  There is incomplete rotation of the left kidney. Mild left  renal atrophy is noted. Left-sided pelvicaliectasis is noted, without definite evidence of hydronephrosis. Nonspecific right-sided perinephric stranding is seen. Poorly characterized cysts are noted at the upper pole of the left kidney.  No free fluid is identified. The small bowel is unremarkable in appearance. The stomach is within normal limits. No acute vascular abnormalities are seen. Minimal calcification is noted at the distal abdominal aorta and its branches.  The appendix is mildly distended, but otherwise unremarkable. There is no definite evidence for appendicitis. The colon is partially filled with stool. Scattered diverticulosis is noted along the descending and proximal sigmoid colon, without evidence of diverticulitis.  The bladder is mildly distended and grossly unremarkable. A small urachal remnant is incidentally seen. The prostate is enlarged, measuring 5.3 cm in transverse dimension, with scattered calcification. Mildly enlarged bilateral inguinal nodes are seen, measuring up to 1.2 cm in short axis, with grossly normal fatty hila.  No acute osseous abnormalities are identified. There is chronic osseous fusion at L4-5, with associated endplate sclerotic change.  IMPRESSION: 1. Suspect acute cholecystitis, with mild soft tissue inflammation about the gallbladder, cholelithiasis and a 1.2 cm stone lodged at the neck of the gallbladder. 2. Mild left renal atrophy noted.  Suspect small left renal cysts. 3. Enlarged prostate noted. 4. Scattered diverticulosis along the descending and proximal sigmoid colon, without evidence of diverticulitis. 5. Mildly enlarged bilateral inguinal nodes, stable in appearance. 6. Bibasilar atelectasis, more prominent on the right, with trace right-sided pleural fluid.   Electronically Signed   By: Garald Balding M.D.   On: 12/01/2014 00:48   Dg Chest 2 View  11/30/2014   CLINICAL DATA:  Acute onset of weakness and vomiting. Right flank pain and constipation. Initial  encounter.  EXAM: CHEST  2 VIEW  COMPARISON:  Chest radiograph from 09/07/2012  FINDINGS: The lungs are hypoexpanded. Mild bibasilar atelectasis is noted. There is no evidence of pleural effusion or pneumothorax.  The heart is mildly enlarged. No acute osseous abnormalities are seen.  IMPRESSION: Lungs hypoexpanded, with mild bibasilar atelectasis. Mild cardiomegaly.   Electronically Signed   By: Garald Balding M.D.   On: 11/30/2014 23:54    Review of Systems  Constitutional: Negative for weight loss and diaphoresis.  HENT: Negative for nosebleeds.   Eyes: Negative for blurred vision.  Respiratory: Negative for shortness of breath.   Cardiovascular: Negative for chest pain, palpitations, orthopnea and PND.       Just fatigued with lots of walking  Gastrointestinal: Positive for nausea, vomiting, abdominal pain and constipation.  Genitourinary: Negative for dysuria and hematuria.  Musculoskeletal: Negative.   Skin: Negative for itching and rash.  Neurological: Positive for weakness. Negative for dizziness, focal weakness, seizures, loss of consciousness and headaches.       Denies TIAs, amaurosis fugax; prior CVAs  Endo/Heme/Allergies: Does not bruise/bleed easily.  Psychiatric/Behavioral: The patient is not nervous/anxious.    Blood pressure 143/66, pulse 75, temperature 98.5 F (36.9 C), temperature source Oral, resp. rate 20, height _0  (1.727 m), weight 194 lb 0.1  oz (88 kg), SpO2 96 %. Physical Exam  Vitals reviewed. Constitutional: He is oriented to person, place, and time. He appears well-developed and well-nourished. No distress.  Elderly AAM; nontoxic, eating a popsicle   HENT:  Head: Normocephalic and atraumatic.  Right Ear: External ear normal.  Left Ear: External ear normal.  Eyes: Conjunctivae are normal. No scleral icterus.  Neck: Normal range of motion. Neck supple. No tracheal deviation present. No thyromegaly present.  Cardiovascular: Normal rate and normal heart  sounds.   Respiratory: Effort normal and breath sounds normal. No stridor. No respiratory distress. He has no wheezes.  GI: Soft. There is tenderness in the right upper quadrant and epigastric area. There is no rigidity, no rebound and no guarding. No hernia.    Full; no rebound/guarding/peritonitis  Musculoskeletal: He exhibits no edema or tenderness.  LLE with compression bandage - unna boot  Lymphadenopathy:    He has no cervical adenopathy.  Neurological: He is alert and oriented to person, place, and time. He exhibits normal muscle tone.  Skin: Skin is warm and dry. No rash noted. He is not diaphoretic. No erythema. No pallor.  Psychiatric: He has a normal mood and affect. His behavior is normal. Judgment and thought content normal.    Assessment/Plan: Acute cholecystitis Elevated troponin Acute on chronic kidney disease Diabetes mellitus Hypertension Hypothyroidism History of CVA  Complex partial seizure disorder Anemia of chronic disease Left lower extremity chronic leg ulcer Use of Plavix  He does have acute cholecystitis. He needs IV antibiotics. I would not advance his diet above clear liquids. I discussed with him and his family gallbladder disease as well as management of acute cholecystitis which includes antibiotics and removal of the gallbladder. We did discuss timing of surgery. Right now he has several issues that factor into timing of surgery. He currently has Plavix on board. He also has a bump in his troponin although mild. It is probably due to his underlying infection. He also has a bump in his baseline creatinine Right now I advised him that surgery will more than likely not be on Sunday since we need iron out these issues.  Follow labs daily Hold Plavix Trend troponins-if continued to increase he will definitely need cardiac consultation Follow kidney function IV antibiotics We will follow with you  Leighton Ruff. Redmond Pulling, MD, FACS General, Bariatric, & Minimally  Invasive Surgery [facility], [place]   [facility] M 12/01/2014, 8:31 PM

## 2014-12-01 NOTE — Progress Notes (Signed)
ANTIBIOTIC CONSULT NOTE  Pharmacy Consult for Cipro Indication: intra-abdominal infection  Allergies  Allergen Reactions  . Bayer Advanced Aspirin [Aspirin] Nausea And Vomiting  . Penicillins Nausea And Vomiting    Patient Measurements: Height: 5\' 8"  (172.7 cm) Weight: 183 lb 4.8 oz (83.144 kg) IBW/kg (Calculated) : 68.4  Vital Signs: Temp: 98.9 F (37.2 C) (12/26 0646) Temp Source: Oral (12/26 0646) BP: 144/63 mmHg (12/26 0646) Pulse Rate: 85 (12/26 0646) Intake/Output from previous day: 12/25 0701 - 12/26 0700 In: 267.5 [I.V.:167.5; IV Piggyback:100] Out: -  Intake/Output from this shift:    Labs:  Recent Labs  11/30/14 2307 12/01/14 0434  WBC 15.2* 14.8*  HGB 8.8* 9.2*  PLT 213 220  CREATININE 3.24* 3.04*   Estimated Creatinine Clearance: 20 mL/min (by C-G formula based on Cr of 3.04). No results for input(s): VANCOTROUGH, VANCOPEAK, VANCORANDOM, GENTTROUGH, GENTPEAK, GENTRANDOM, TOBRATROUGH, TOBRAPEAK, TOBRARND, AMIKACINPEAK, AMIKACINTROU, AMIKACIN in the last 72 hours.   Microbiology: No results found for this or any previous visit (from the past 720 hour(s)).  Anti-infectives    Start     Dose/Rate Route Frequency Ordered Stop   12/01/14 0400  metroNIDAZOLE (FLAGYL) IVPB 500 mg     500 mg100 mL/hr over 60 Minutes Intravenous Every 8 hours 12/01/14 0356     12/01/14 0230  metroNIDAZOLE (FLAGYL) IVPB 500 mg  Status:  Discontinued     500 mg100 mL/hr over 60 Minutes Intravenous  Once 12/01/14 0218 12/01/14 0401   12/01/14 0215  ciprofloxacin (CIPRO) IVPB 400 mg     400 mg200 mL/hr over 60 Minutes Intravenous  Once 12/01/14 0207 12/01/14 0321      Assessment: 80 yo M admitted with acute cholecystitis to be covered with empiric Cipro/Flagyl.   He is afebrile with leukocytosis.  Chronic kidney disease noted.    Cipro 12/26>> Flagyl 12/26>>  Goal of Therapy:  Eradicate infection.  Plan:  Cipro 400mg  IV q24h Continue Flagyl per MD Monitor renal  function, cx data, patient progress  Ondine Gemme, Lavonia Drafts 12/01/2014,7:59 AM

## 2014-12-01 NOTE — Consult Note (Signed)
Referring Provider: Rockwell Alexandria, MD Primary Care Physician:  Tula Nakayama, MD Primary Gastroenterologist:  Dr. Laural Golden  Reason for Consultation:    Acute cholecystitis. Large stone at neck of gallbladder. Will patient need an ERCP.  HPI:   Patient is 80 year old African-American male with multiple medical problems who was in usual state of health until 11/26/2014 when he developed right upper quadrant abdominal pain. Over the next couple of days pain was waxing and waning associated with nausea and loss of appetite. He also had single episode of emesis. Yesterday he noted worsening pain when he would take a deep breath cough. She was brought to emergency room last night and he was evaluated by Dr. Nat Christen. Abdominopelvic CT was obtained and it revealed only lithiasis along with 12 mm stone large the neck of the gallbladder thickened gallbladder walls with inflammatory changes surrounding it. Bile duct was not dilated. Patient was admitted to hospitalist service and begun on IV Cipro and metronidazole. He remains with constant pain although pain does not increase in intensity when he coughs or takes a deep breath. He denies chest pain or shortness of breath. He also denies melena or rectal bleeding. Patient has never experienced pain like this before. Review of the systems is positive for 20 pound weight loss. His wife indicates that this is mainly 42 weight loss because his physician has wanted him to do so on account of his diabetes. He has chronic left leg ulcer. He goes to wound clinic. There is no drainage from this ulcer. He complains of bilateral knee pain. He has difficulty ambulating on account of weakness in right leg. He also has weakness in right upper extremity and hand.   Past Medical History  Diagnosis Date  . Peripheral vascular disease, unspecified   . Depressive disorder, not elsewhere classified   . Obesity   . Other and unspecified hyperlipidemia   . Pain in  joint, lower leg   . Osteoarthrosis, unspecified whether generalized or localized, lower leg   . Derangement of meniscus, not elsewhere classified   . Lumbago   . Spondylosis of unspecified site without mention of myelopathy   . Spinal stenosis, unspecified region other than cervical   . Backache, unspecified   . Family history of diabetes mellitus   . Seizures   . Complete lesion of cervical spinal cord 03/14/3012    Stable since 2006  . Lacunar stroke, acute 03/14/2012  . Bradycardia 03/15/2012  . Gait disorder   . Knee contracture   . Diabetic neuropathy   . CKD (chronic kidney disease) stage 3, GFR 30-59 ml/min   . CKD (chronic kidney disease) stage 4, GFR 15-29 ml/min   . Diabetes mellitus approx 1994  . Hypothyroidism approx 2000  . Unspecified hypothyroidism   . Hypertension approx 1994  . Unspecified essential hypertension   . CVA (cerebrovascular accident) 09/07/12    Past Surgical History  Procedure Laterality Date  . Kidney stones left x2  1975  . Kidney surgery      Ruptured left kidney 30 yrs ago  from a kidney stone  . Colonoscopy N/A 08/10/2013    Procedure: COLONOSCOPY;  Surgeon: Rogene Houston, MD;  Location: [facility];  Service: Endoscopy;  Laterality: N/A;  240    Prior to Admission medications   Medication Sig Start Date End Date Taking? Authorizing Provider  amLODipine (NORVASC) 10 MG tablet TAKE ONE TABLET BY MOUTH ONCE DAILY 07/26/14  Yes [name], MD  aspirin EC  81 MG tablet Take 1 tablet (81 mg total) by mouth daily. 06/16/13  Yes [name], MD  chlorthalidone (HYGROTON) 50 MG tablet Take 1 tablet (50 mg total) by mouth daily. 06/12/14  Yes [name], MD  clopidogrel (PLAVIX) 75 MG tablet TAKE ONE TABLET BY MOUTH IN THE MORNING WITH BREAKFAST Patient taking differently: Take 75 mg by mouth daily with breakfast.  02/27/14  Yes [name], MD  hydrALAZINE (APRESOLINE) 50 MG tablet Take 1 tablet (50 mg total) by mouth 2 (two)  times daily. 10/16/14  Yes Herminio Commons, MD  insulin lispro protamine-insulin lispro (HUMALOG 75/25) (75-25) 100 UNIT/ML SUSP Inject 10 Units into the skin 2 (two) times daily. As directed   Yes Historical Provider, MD  levETIRAcetam (KEPPRA) 500 MG tablet Take 250 mg by mouth at bedtime.    Yes Historical Provider, MD  levothyroxine (SYNTHROID, LEVOTHROID) 175 MCG tablet Take 175 mcg by mouth daily before breakfast.  04/27/14  Yes Historical Provider, MD  lovastatin (MEVACOR) 40 MG tablet TAKE ONE TABLET BY MOUTH AT BEDTIME 07/26/14  Yes [name], MD  Multiple Vitamins-Minerals (EYE VITAMINS PO) Take 1 tablet by mouth daily.    Yes Historical Provider, MD  tamsulosin (FLOMAX) 0.4 MG CAPS capsule Take 0.4 mg by mouth.   Yes Historical Provider, MD  Vitamin D, Ergocalciferol, (DRISDOL) 50000 UNITS CAPS capsule Take 50,000 Units by mouth every 7 (seven) days. Takes on Sunday. 04/27/14  Yes Historical Provider, MD    Current Facility-Administered Medications  Medication Dose Route Frequency Provider Last Rate Last Dose  . 0.9 %  sodium chloride infusion   Intravenous Continuous Rise Patience, MD 75 mL/hr at 12/01/14 0441    . acetaminophen (TYLENOL) tablet 650 mg  650 mg Oral Q6H PRN Rise Patience, MD       Or  . acetaminophen (TYLENOL) suppository 650 mg  650 mg Rectal Q6H PRN Rise Patience, MD      . Derrill Memo ON 12/02/2014] ciprofloxacin (CIPRO) IVPB 400 mg  400 mg Intravenous Q24H Lavonia Drafts Lill[place], RPH      . hydrALAZINE (APRESOLINE) injection 10 mg  10 mg Intravenous Q4H PRN Rise Patience, MD      . insulin aspart (novoLOG) injection 0-9 Units  0-9 Units Subcutaneous Q4H Rise Patience, MD   1 Units at 12/01/14 0441  . levETIRAcetam (KEPPRA) 250 mg in sodium chloride 0.9 % 100 mL IVPB  250 mg Intravenous QHS Rise Patience, MD      . levothyroxine (SYNTHROID, LEVOTHROID) injection 87.5 mcg  87.5 mcg Intravenous QAC breakfast Rise Patience, MD   87.5 mcg at 12/01/14 0902  . metroNIDAZOLE (FLAGYL) IVPB 500 mg  500 mg Intravenous Q8H Rise Patience, MD   500 mg at 12/01/14 0440  . morphine 2 MG/ML injection 1 mg  1 mg Intravenous Q4H PRN Rise Patience, MD      . ondansetron [facility]) tablet 4 mg  4 mg Oral Q6H PRN Rise Patience, MD       Or  . ondansetron [facility]) injection 4 mg  4 mg Intravenous Q6H PRN Rise Patience, MD        Allergies as of 11/30/2014 - Review Complete 11/30/2014  Allergen Reaction Noted  . Bayer advanced aspirin [aspirin] Nausea And Vomiting 03/05/2012  . Penicillins Nausea And Vomiting     Family History  Problem Relation Age of Onset  . Diabetes Mother   .  Prostate cancer Father   . Diabetes Brother   . Diabetes Brother   . Hypertension Brother   . Hypertension Brother   . Hypertension Brother     History   Social History  . Marital Status: Married    Spouse Name: N/A    Number of Children: 85  . Years of Education: N/A   Occupational History  . retired     Social History Main Topics  . Smoking status: Never Smoker   . Smokeless tobacco: Never Used  . Alcohol Use: No  . Drug Use: No  . Sexual Activity: No   Other Topics Concern  . Not on file   Social History Narrative    Review of Systems: See HPI, otherwise normal ROS  Physical Exam: Temp:  [98 F (36.7 C)-99.7 F (37.6 C)] 98.9 F (37.2 C) (12/26 0646) Pulse Rate:  [74-90] 85 (12/26 0646) Resp:  [14-18] 18 (12/26 0646) BP: (124-151)/(63-73) 144/63 mmHg (12/26 0646) SpO2:  [95 %-100 %] 98 % (12/26 0646) Weight:  [183 lb 4.8 oz (83.144 kg)-189 lb (85.73 kg)] 183 lb 4.8 oz (83.144 kg) (12/26 0309) Last BM Date: 11/26/14  Patient is alert and in no acute distress. Conjunctiva is pale. Sclerae nonicteric.  Oropharyngeal mucosa is dry. He has few remaining teeth in satisfactory condition. No neck masses or thyromegaly noted. Cardiac exam with regular rhythm normal S1 and S2. No murmur  or gallop noted. Lungs are clear to auscultation. Abdomen is symmetrical. Bowel sounds are normal. On palpation abdomen is soft with moderate tenderness below the right costal margin with guarding. No organomegaly or masses. No leg edema noted. He has Ace wrap covering left leg. He has weak grip in right hand.  Lab Results:  Recent Labs  11/30/14 2307 12/01/14 0434  WBC 15.2* 14.8*  HGB 8.8* 9.2*  HCT 26.8* 28.0*  PLT 213 220   BMET  Recent Labs  11/30/14 2307 12/01/14 0434  NA 132* 134*  K 3.8 3.8  CL 105 105  CO2 21 21  GLUCOSE 164* 141*  BUN 53* 52*  CREATININE 3.24* 3.04*  CALCIUM 8.3* 8.4   LFT  Recent Labs  12/01/14 0434  PROT 6.5  ALBUMIN 2.9*  AST 26  ALT 22  ALKPHOS 74  BILITOT 0.6   PT/INR No results for input(s): LABPROT, INR in the last 72 hours. Hepatitis Panel No results for input(s): HEPBSAG, HCVAB, HEPAIGM, HEPBIGM in the last 72 hours.  Studies/Results: Ct Abdomen Pelvis Wo Contrast  12/01/2014   CLINICAL DATA:  Acute onset of abdominal pain and constipation for 3 days. Nausea. Initial encounter.  EXAM: CT ABDOMEN AND PELVIS WITHOUT CONTRAST  TECHNIQUE: Multidetector CT imaging of the abdomen and pelvis was performed following the standard protocol without IV contrast.  COMPARISON:  CT of the abdomen and pelvis from 08/10/2012, and renal ultrasound performed 03/20/2014  FINDINGS: Bibasilar atelectasis is noted, more prominent on the right, with trace right-sided pleural fluid.  A calcified granuloma is noted within the right hepatic lobe. The spleen is unremarkable.  There is mild soft tissue inflammation about the gallbladder, raising concern for mild acute cholecystitis. Stones are noted within the gallbladder. There is a 1.2 cm stone lodged at the neck of the gallbladder. No intrahepatic biliary ductal dilatation is seen. The common hepatic duct remains normal in caliber.  The pancreas and adrenal glands are unremarkable.  There is incomplete  rotation of the left kidney. Mild left renal atrophy is noted. Left-sided pelvicaliectasis is  noted, without definite evidence of hydronephrosis. Nonspecific right-sided perinephric stranding is seen. Poorly characterized cysts are noted at the upper pole of the left kidney.  No free fluid is identified. The small bowel is unremarkable in appearance. The stomach is within normal limits. No acute vascular abnormalities are seen. Minimal calcification is noted at the distal abdominal aorta and its branches.  The appendix is mildly distended, but otherwise unremarkable. There is no definite evidence for appendicitis. The colon is partially filled with stool. Scattered diverticulosis is noted along the descending and proximal sigmoid colon, without evidence of diverticulitis.  The bladder is mildly distended and grossly unremarkable. A small urachal remnant is incidentally seen. The prostate is enlarged, measuring 5.3 cm in transverse dimension, with scattered calcification. Mildly enlarged bilateral inguinal nodes are seen, measuring up to 1.2 cm in short axis, with grossly normal fatty hila.  No acute osseous abnormalities are identified. There is chronic osseous fusion at L4-5, with associated endplate sclerotic change.  IMPRESSION: 1. Suspect acute cholecystitis, with mild soft tissue inflammation about the gallbladder, cholelithiasis and a 1.2 cm stone lodged at the neck of the gallbladder. 2. Mild left renal atrophy noted.  Suspect small left renal cysts. 3. Enlarged prostate noted. 4. Scattered diverticulosis along the descending and proximal sigmoid colon, without evidence of diverticulitis. 5. Mildly enlarged bilateral inguinal nodes, stable in appearance. 6. Bibasilar atelectasis, more prominent on the right, with trace right-sided pleural fluid.   Electronically Signed   By: Garald Balding M.D.   On: 12/01/2014 00:48   Dg Chest 2 View  11/30/2014   CLINICAL DATA:  Acute onset of weakness and vomiting.  Right flank pain and constipation. Initial encounter.  EXAM: CHEST  2 VIEW  COMPARISON:  Chest radiograph from 09/07/2012  FINDINGS: The lungs are hypoexpanded. Mild bibasilar atelectasis is noted. There is no evidence of pleural effusion or pneumothorax.  The heart is mildly enlarged. No acute osseous abnormalities are seen.  IMPRESSION: Lungs hypoexpanded, with mild bibasilar atelectasis. Mild cardiomegaly.   Electronically Signed   By: Garald Balding M.D.   On: 11/30/2014 23:54   I went over CT images with patient's 2 children and wife at bedside.  Assessment;  #1. Patient has acute cholecystitis. Symptoms started five days ago. Patient is in need of cholecystectomy. His transaminases are normal in bile duct is not dilated. Therefore there is no need or indication for ERCP. If possible he can have intraoperative cholangiogram. Dr. Arnoldo Morale has been consulted. Patient is high risk for surgery patient's son wants him to be transferred to [facility]. I would let him and Dr. Arnoldo Morale make the final decision. #2. Anemia. Anemia appears to be of chronic disease. There is no evidence of active GI bleed. #3. Mildly elevated troponin levels probably insignificant given chronic kidney disease. Echo is negative for regional wall motion abnormalities and EF is 55-60%. He does have moderate LVH.  Recommendations;  Surgical consultation as planned. No plans for ERCP or other endoscopic intervention at this time.   LOS: 1 day   Shawnita Krizek U  12/01/2014, 11:55 AM

## 2014-12-01 NOTE — Consult Note (Signed)
Reason for Consult: Cholecystitis, cholelithiasis Referring Physician: Hospitalist  CLINT BIELLO is an 80 y.o. male.  HPI: Patient is an 80 year old black male with multiple medical problems who has had a four-day history of worsening right upper quadrant abdominal pain. CT scan of the abdomen reveals a single gallstone within the neck of the gallbladder. The hepatobiliary tree is otherwise unremarkable. His liver enzyme tests within normal limits. He does have an elevated white blood cell count. He was admitted to the hospital for further evaluation treatment. He has had no emesis. He has never had an episode like this before.  Past Medical History  Diagnosis Date  . Peripheral vascular disease, unspecified   . Depressive disorder, not elsewhere classified   . Obesity   . Other and unspecified hyperlipidemia   . Pain in joint, lower leg   . Osteoarthrosis, unspecified whether generalized or localized, lower leg   . Derangement of meniscus, not elsewhere classified   . Lumbago   . Spondylosis of unspecified site without mention of myelopathy   . Spinal stenosis, unspecified region other than cervical   . Backache, unspecified   . Family history of diabetes mellitus   . Seizures   . Complete lesion of cervical spinal cord 03/14/3012    Stable since 2006  . Lacunar stroke, acute 03/14/2012  . Bradycardia 03/15/2012  . Gait disorder   . Knee contracture   . Diabetic neuropathy   . CKD (chronic kidney disease) stage 3, GFR 30-59 ml/min   . CKD (chronic kidney disease) stage 4, GFR 15-29 ml/min   . Diabetes mellitus approx 1994  . Hypothyroidism approx 2000  . Unspecified hypothyroidism   . Hypertension approx 1994  . Unspecified essential hypertension   . CVA (cerebrovascular accident) 09/07/12    Past Surgical History  Procedure Laterality Date  . Kidney stones left x2  1975  . Kidney surgery      Ruptured left kidney 30 yrs ago  from a kidney stone  . Colonoscopy N/A 08/10/2013   Procedure: COLONOSCOPY;  Surgeon: Rogene Houston, MD;  Location: [facility];  Service: Endoscopy;  Laterality: N/A;  240    Family History  Problem Relation Age of Onset  . Diabetes Mother   . Prostate cancer Father   . Diabetes Brother   . Diabetes Brother   . Hypertension Brother   . Hypertension Brother   . Hypertension Brother     Social History:  reports that he has never smoked. He has never used smokeless tobacco. He reports that he does not drink alcohol or use illicit drugs.  Allergies:  Allergies  Allergen Reactions  . Bayer Advanced Aspirin [Aspirin] Nausea And Vomiting  . Penicillins Nausea And Vomiting    Medications: I have reviewed the patient's current medications.  Results for orders placed or performed during the hospital encounter of 11/30/14 (from the past 48 hour(s))  CBC with Differential     Status: Abnormal   Collection Time: 11/30/14 11:07 PM  Result Value Ref Range   WBC 15.2 (H) 4.0 - 10.5 K/uL   RBC 2.85 (L) 4.22 - 5.81 MIL/uL   Hemoglobin 8.8 (L) 13.0 - 17.0 g/dL   HCT 26.8 (L) 39.0 - 52.0 %   MCV 94.0 78.0 - 100.0 fL   MCH 30.9 26.0 - 34.0 pg   MCHC 32.8 30.0 - 36.0 g/dL   RDW 12.7 11.5 - 15.5 %   Platelets 213 150 - 400 K/uL   Neutrophils Relative %  85 (H) 43 - 77 %   Neutro Abs 12.9 (H) 1.7 - 7.7 K/uL   Lymphocytes Relative 7 (L) 12 - 46 %   Lymphs Abs 1.1 0.7 - 4.0 K/uL   Monocytes Relative 8 3 - 12 %   Monocytes Absolute 1.2 (H) 0.1 - 1.0 K/uL   Eosinophils Relative 0 0 - 5 %   Eosinophils Absolute 0.1 0.0 - 0.7 K/uL   Basophils Relative 0 0 - 1 %   Basophils Absolute 0.0 0.0 - 0.1 K/uL  Comprehensive metabolic panel     Status: Abnormal   Collection Time: 11/30/14 11:07 PM  Result Value Ref Range   Sodium 132 (L) 135 - 145 mmol/L    Comment: Please note change in reference range.   Potassium 3.8 3.5 - 5.1 mmol/L    Comment: Please note change in reference range.   Chloride 105 96 - 112 mEq/L   CO2 21 19 - 32 mmol/L    Glucose, Bld 164 (H) 70 - 99 mg/dL   BUN 53 (H) 6 - 23 mg/dL   Creatinine, Ser 3.24 (H) 0.50 - 1.35 mg/dL   Calcium 8.3 (L) 8.4 - 10.5 mg/dL   Total Protein 6.6 6.0 - 8.3 g/dL   Albumin 3.0 (L) 3.5 - 5.2 g/dL   AST 27 0 - 37 U/L   ALT 20 0 - 53 U/L   Alkaline Phosphatase 75 39 - 117 U/L   Total Bilirubin 0.4 0.3 - 1.2 mg/dL   GFR calc non Af Amer 17 (L) >90 mL/min   GFR calc Af Amer 19 (L) >90 mL/min    Comment: (NOTE) The eGFR has been calculated using the CKD EPI equation. This calculation has not been validated in all clinical situations. eGFR's persistently <90 mL/min signify possible Chronic Kidney Disease.    Anion gap 6 5 - 15  Lipase, blood     Status: None   Collection Time: 11/30/14 11:07 PM  Result Value Ref Range   Lipase 24 11 - 59 U/L  Troponin I     Status: Abnormal   Collection Time: 11/30/14 11:07 PM  Result Value Ref Range   Troponin I 0.05 (H) <0.031 ng/mL    Comment:        PERSISTENTLY INCREASED TROPONIN VALUES IN THE RANGE OF 0.04-0.49 ng/mL CAN BE SEEN IN:       -UNSTABLE ANGINA       -CONGESTIVE HEART FAILURE       -MYOCARDITIS       -CHEST TRAUMA       -ARRYHTHMIAS       -LATE PRESENTING MYOCARDIAL INFARCTION       -COPD   CLINICAL FOLLOW-UP RECOMMENDED. Please note change in reference range.   Urinalysis, Routine w reflex microscopic     Status: Abnormal   Collection Time: 11/30/14 11:59 PM  Result Value Ref Range   Color, Urine YELLOW YELLOW   APPearance CLEAR CLEAR   Specific Gravity, Urine 1.015 1.005 - 1.030   pH 6.0 5.0 - 8.0   Glucose, UA NEGATIVE NEGATIVE mg/dL   Hgb urine dipstick NEGATIVE NEGATIVE   Bilirubin Urine NEGATIVE NEGATIVE   Ketones, ur NEGATIVE NEGATIVE mg/dL   Protein, ur 100 (A) NEGATIVE mg/dL   Urobilinogen, UA 0.2 0.0 - 1.0 mg/dL   Nitrite NEGATIVE NEGATIVE   Leukocytes, UA NEGATIVE NEGATIVE  Urine microscopic-add on     Status: None   Collection Time: 11/30/14 11:59 PM  Result Value Ref Range  Squamous  Epithelial / LPF RARE RARE   WBC, UA 0-2 <3 WBC/hpf   RBC / HPF 0-2 <3 RBC/hpf   Bacteria, UA RARE RARE  Glucose, capillary     Status: Abnormal   Collection Time: 12/01/14  4:14 AM  Result Value Ref Range   Glucose-Capillary 135 (H) 70 - 99 mg/dL   Comment 1 Notify RN   Type and screen     Status: None   Collection Time: 12/01/14  4:22 AM  Result Value Ref Range   ABO/RH(D) O POS    Antibody Screen NEG    Sample Expiration 12/04/2014   Troponin I (q 6hr x 3)     Status: Abnormal   Collection Time: 12/01/14  4:34 AM  Result Value Ref Range   Troponin I 0.04 (H) <0.031 ng/mL    Comment:        PERSISTENTLY INCREASED TROPONIN VALUES IN THE RANGE OF 0.04-0.49 ng/mL CAN BE SEEN IN:       -UNSTABLE ANGINA       -CONGESTIVE HEART FAILURE       -MYOCARDITIS       -CHEST TRAUMA       -ARRYHTHMIAS       -LATE PRESENTING MYOCARDIAL INFARCTION       -COPD   CLINICAL FOLLOW-UP RECOMMENDED. Please note change in reference range.   Comprehensive metabolic panel     Status: Abnormal   Collection Time: 12/01/14  4:34 AM  Result Value Ref Range   Sodium 134 (L) 135 - 145 mmol/L    Comment: Please note change in reference range.   Potassium 3.8 3.5 - 5.1 mmol/L    Comment: Please note change in reference range.   Chloride 105 96 - 112 mEq/L   CO2 21 19 - 32 mmol/L   Glucose, Bld 141 (H) 70 - 99 mg/dL   BUN 52 (H) 6 - 23 mg/dL   Creatinine, Ser 3.04 (H) 0.50 - 1.35 mg/dL   Calcium 8.4 8.4 - 10.5 mg/dL   Total Protein 6.5 6.0 - 8.3 g/dL   Albumin 2.9 (L) 3.5 - 5.2 g/dL   AST 26 0 - 37 U/L   ALT 22 0 - 53 U/L   Alkaline Phosphatase 74 39 - 117 U/L   Total Bilirubin 0.6 0.3 - 1.2 mg/dL   GFR calc non Af Amer 18 (L) >90 mL/min   GFR calc Af Amer 21 (L) >90 mL/min    Comment: (NOTE) The eGFR has been calculated using the CKD EPI equation. This calculation has not been validated in all clinical situations. eGFR's persistently <90 mL/min signify possible Chronic Kidney Disease.     Anion gap 8 5 - 15  CBC with Differential     Status: Abnormal   Collection Time: 12/01/14  4:34 AM  Result Value Ref Range   WBC 14.8 (H) 4.0 - 10.5 K/uL   RBC 2.97 (L) 4.22 - 5.81 MIL/uL   Hemoglobin 9.2 (L) 13.0 - 17.0 g/dL   HCT 28.0 (L) 39.0 - 52.0 %   MCV 94.3 78.0 - 100.0 fL   MCH 31.0 26.0 - 34.0 pg   MCHC 32.9 30.0 - 36.0 g/dL   RDW 12.7 11.5 - 15.5 %   Platelets 220 150 - 400 K/uL   Neutrophils Relative % 82 (H) 43 - 77 %   Neutro Abs 12.2 (H) 1.7 - 7.7 K/uL   Lymphocytes Relative 7 (L) 12 - 46 %   Lymphs Abs 1.0 0.7 -  4.0 K/uL   Monocytes Relative 10 3 - 12 %   Monocytes Absolute 1.5 (H) 0.1 - 1.0 K/uL   Eosinophils Relative 1 0 - 5 %   Eosinophils Absolute 0.1 0.0 - 0.7 K/uL   Basophils Relative 0 0 - 1 %   Basophils Absolute 0.0 0.0 - 0.1 K/uL  Glucose, capillary     Status: Abnormal   Collection Time: 12/01/14  7:42 AM  Result Value Ref Range   Glucose-Capillary 113 (H) 70 - 99 mg/dL  Troponin I (q 6hr x 3)     Status: Abnormal   Collection Time: 12/01/14  9:56 AM  Result Value Ref Range   Troponin I 0.04 (H) <0.031 ng/mL    Comment:        PERSISTENTLY INCREASED TROPONIN VALUES IN THE RANGE OF 0.04-0.49 ng/mL CAN BE SEEN IN:       -UNSTABLE ANGINA       -CONGESTIVE HEART FAILURE       -MYOCARDITIS       -CHEST TRAUMA       -ARRYHTHMIAS       -LATE PRESENTING MYOCARDIAL INFARCTION       -COPD   CLINICAL FOLLOW-UP RECOMMENDED. Please note change in reference range.   Glucose, capillary     Status: Abnormal   Collection Time: 12/01/14 12:14 PM  Result Value Ref Range   Glucose-Capillary 147 (H) 70 - 99 mg/dL    Ct Abdomen Pelvis Wo Contrast  12/01/2014   CLINICAL DATA:  Acute onset of abdominal pain and constipation for 3 days. Nausea. Initial encounter.  EXAM: CT ABDOMEN AND PELVIS WITHOUT CONTRAST  TECHNIQUE: Multidetector CT imaging of the abdomen and pelvis was performed following the standard protocol without IV contrast.  COMPARISON:  CT of the  abdomen and pelvis from 08/10/2012, and renal ultrasound performed 03/20/2014  FINDINGS: Bibasilar atelectasis is noted, more prominent on the right, with trace right-sided pleural fluid.  A calcified granuloma is noted within the right hepatic lobe. The spleen is unremarkable.  There is mild soft tissue inflammation about the gallbladder, raising concern for mild acute cholecystitis. Stones are noted within the gallbladder. There is a 1.2 cm stone lodged at the neck of the gallbladder. No intrahepatic biliary ductal dilatation is seen. The common hepatic duct remains normal in caliber.  The pancreas and adrenal glands are unremarkable.  There is incomplete rotation of the left kidney. Mild left renal atrophy is noted. Left-sided pelvicaliectasis is noted, without definite evidence of hydronephrosis. Nonspecific right-sided perinephric stranding is seen. Poorly characterized cysts are noted at the upper pole of the left kidney.  No free fluid is identified. The small bowel is unremarkable in appearance. The stomach is within normal limits. No acute vascular abnormalities are seen. Minimal calcification is noted at the distal abdominal aorta and its branches.  The appendix is mildly distended, but otherwise unremarkable. There is no definite evidence for appendicitis. The colon is partially filled with stool. Scattered diverticulosis is noted along the descending and proximal sigmoid colon, without evidence of diverticulitis.  The bladder is mildly distended and grossly unremarkable. A small urachal remnant is incidentally seen. The prostate is enlarged, measuring 5.3 cm in transverse dimension, with scattered calcification. Mildly enlarged bilateral inguinal nodes are seen, measuring up to 1.2 cm in short axis, with grossly normal fatty hila.  No acute osseous abnormalities are identified. There is chronic osseous fusion at L4-5, with associated endplate sclerotic change.  IMPRESSION: 1. Suspect acute cholecystitis,  with  mild soft tissue inflammation about the gallbladder, cholelithiasis and a 1.2 cm stone lodged at the neck of the gallbladder. 2. Mild left renal atrophy noted.  Suspect small left renal cysts. 3. Enlarged prostate noted. 4. Scattered diverticulosis along the descending and proximal sigmoid colon, without evidence of diverticulitis. 5. Mildly enlarged bilateral inguinal nodes, stable in appearance. 6. Bibasilar atelectasis, more prominent on the right, with trace right-sided pleural fluid.   Electronically Signed   By: Garald Balding M.D.   On: 12/01/2014 00:48   Dg Chest 2 View  11/30/2014   CLINICAL DATA:  Acute onset of weakness and vomiting. Right flank pain and constipation. Initial encounter.  EXAM: CHEST  2 VIEW  COMPARISON:  Chest radiograph from 09/07/2012  FINDINGS: The lungs are hypoexpanded. Mild bibasilar atelectasis is noted. There is no evidence of pleural effusion or pneumothorax.  The heart is mildly enlarged. No acute osseous abnormalities are seen.  IMPRESSION: Lungs hypoexpanded, with mild bibasilar atelectasis. Mild cardiomegaly.   Electronically Signed   By: Garald Balding M.D.   On: 11/30/2014 23:54    ROS: See chart Blood pressure 144/63, pulse 85, temperature 98.9 F (37.2 C), temperature source Oral, resp. rate 18, height _0  (1.727 m), weight 83.144 kg (183 lb 4.8 oz), SpO2 98 %. Physical Exam: Pleasant black male in mild discomfort. Abdomen is soft but mildly distended. He is tender in the right upper quadrant to palpation. No rigidity is noted. Minimal bowel sounds are appreciated.  Assessment/Plan: Impression: Cholecystitis, cholelithiasis, renal failure, history of CVA, mildly elevated troponins, coronary artery disease Plan: Patient does need surgical treatment of his cholecystitis, but he has multiple other medical problems which preclude him from being operated on here at [facility]. Before I came in the room, the family had already been requesting transfer to  tertiary care center. They agree with transfer to [facility] for further management treatment.  Melik Blancett A 12/01/2014, 12:27 PM

## 2014-12-01 NOTE — Progress Notes (Signed)
Pt arrived via EMS and placed in bed.  Oriented pt to room and showed safety video.  Pt requested pain medication for RUQ.  Denied any other needs.  Will continue to monitor.

## 2014-12-01 NOTE — Progress Notes (Signed)
  Echocardiogram 2D Echocardiogram has been performed.  Chase [name] 12/01/2014, 8:49 AM

## 2014-12-01 NOTE — Progress Notes (Signed)
Patient admitted to the hospital earlier this morning by Dr. Hal Hope  Patient seen and examined.  Case discussed with Dr. Laural Golden and Dr. Arnoldo Morale. This is an 80 year old gentleman who presented to the hospital with complaints of right upper quadrant pain. He was found to have a stone in the neck of his gallbladder with surrounding inflammation consistent with cholecystitis and cholelithiasis. He did not appear to have any choledocholithiasis and his liver enzymes are found to be normal. It was felt that he would likely need cholecystectomy. Patient's family has requested transfer to a larger center, namely [place]. Dr. Arnoldo Morale agrees that this may be reasonable to do his multiple comorbidities. He does have chronic kidney disease with baseline creatinine of 2.5, history of CVA, diabetes. He takes aspirin and Plavix at home which have both been held on admission.   His troponin is mildly elevated at 0.04, although his EKG does not show any acute changes. He's had a 2-D echocardiogram which did not show any acute findings. Ejection fraction is normal. This could be some mild demand ischemia in the setting of decreased clearance due to renal failure  Creatinine has mildly improved with intravenous hydration indicating an element of dehydration. Would continue IV fluids for now.  He is continued on Flagyl and ciprofloxacin for antibiotic coverage. I have discussed the case with Dr. Redmond Pulling with [facility] [place] [facility] who has agreed to see the patient in consult. Dr. Conley Canal has accepted the patient in transfer.  MEMON,JEHANZEB

## 2014-12-01 NOTE — Progress Notes (Signed)
Chase [name] is a 80 y.o. male patient who transferring to [facility] room 28 awake, alert  & orientated  X 3, Full Code, VSS - Blood pressure 126/67, pulse 75, temperature 98.8 F (37.1 C), temperature source Oral, resp. rate 18, height 5\' 8"  (1.727 m), weight 83.144 kg (183 lb 4.8 oz), SpO2 99 %. No c/o shortness of breath, no c/o chest pain, no distress noted. Ryan from [place] received report. Anderson Malta, RN at [facility] received report on this patient and had no questions or concerns.   IV site WDL: 20g Left upper arm with a transparent dsg that's clean dry and intact.  Allergies:   Allergies  Allergen Reactions  . Bayer Advanced Aspirin [Aspirin] Nausea And Vomiting  . Penicillins Nausea And Vomiting     Past Medical History  Diagnosis Date  . Peripheral vascular disease, unspecified   . Depressive disorder, not elsewhere classified   . Obesity   . Other and unspecified hyperlipidemia   . Pain in joint, lower leg   . Osteoarthrosis, unspecified whether generalized or localized, lower leg   . Derangement of meniscus, not elsewhere classified   . Lumbago   . Spondylosis of unspecified site without mention of myelopathy   . Spinal stenosis, unspecified region other than cervical   . Backache, unspecified   . Family history of diabetes mellitus   . Seizures   . Complete lesion of cervical spinal cord 03/14/3012    Stable since 2006  . Lacunar stroke, acute 03/14/2012  . Bradycardia 03/15/2012  . Gait disorder   . Knee contracture   . Diabetic neuropathy   . CKD (chronic kidney disease) stage 3, GFR 30-59 ml/min   . CKD (chronic kidney disease) stage 4, GFR 15-29 ml/min   . Diabetes mellitus approx 1994  . Hypothyroidism approx 2000  . Unspecified hypothyroidism   . Hypertension approx 1994  . Unspecified essential hypertension   . CVA (cerebrovascular accident) 09/07/12    Regino Bellow, RN 12/01/2014 6:05 PM

## 2014-12-01 NOTE — H&P (Addendum)
[facility] History and Physical  Chase [name] [id number] DOB: Apr 22, 1931 DOA: 11/30/2014  Referring physician: ER physician. PCP: Tula Nakayama, MD   Chief Complaint: Right upper quadrant pain.  HPI: Chase [name] is a 80 y.o. male with history of diabetes mellitus type 2, chronic kidney disease stage III, chronic anemia, history of CVA, hypothyroidism, history of partial seizures presents to the ER because of right upper quadrant pain. Patient has been having right upper quadrant pain over the last 4-5 days. Initially 5 days ago patient also had an episode of vomiting. Patient has been having poor appetite and has not moved his bowels for last 4-5 days. In the ER CT abdomen and pelvis done shows features concerning for acute cholecystitis and on-call surgeon has been consulted patient has been admitted for further management of his possible acute cholecystitis. Patient's troponin is found to be mildly elevated but patient denies any chest pain or shortness of breath and EKG is unremarkable at this time. Patient has not had any previous history of CAD.   Review of Systems: As presented in the history of presenting illness, rest negative.  Past Medical History  Diagnosis Date  . Peripheral vascular disease, unspecified   . Depressive disorder, not elsewhere classified   . Obesity   . Other and unspecified hyperlipidemia   . Pain in joint, lower leg   . Osteoarthrosis, unspecified whether generalized or localized, lower leg   . Derangement of meniscus, not elsewhere classified   . Lumbago   . Spondylosis of unspecified site without mention of myelopathy   . Spinal stenosis, unspecified region other than cervical   . Backache, unspecified   . Family history of diabetes mellitus   . Seizures   . Complete lesion of cervical spinal cord 03/14/3012    Stable since 2006  . Lacunar stroke, acute 03/14/2012  . Bradycardia 03/15/2012  . Gait disorder   . Knee contracture   .  Diabetic neuropathy   . CKD (chronic kidney disease) stage 3, GFR 30-59 ml/min   . CKD (chronic kidney disease) stage 4, GFR 15-29 ml/min   . Diabetes mellitus approx 1994  . Hypothyroidism approx 2000  . Unspecified hypothyroidism   . Hypertension approx 1994  . Unspecified essential hypertension   . CVA (cerebrovascular accident) 09/07/12   Past Surgical History  Procedure Laterality Date  . Kidney stones left x2  1975  . Kidney surgery      Ruptured left kidney 30 yrs ago  from a kidney stone  . Colonoscopy N/A 08/10/2013    Procedure: COLONOSCOPY;  Surgeon: Rogene Houston, MD;  Location: [facility];  Service: Endoscopy;  Laterality: N/A;  240   Social History:  reports that he has never smoked. He has never used smokeless tobacco. He reports that he does not drink alcohol or use illicit drugs. Where does patient live home. Can patient participate in ADLs? Yes.  Allergies  Allergen Reactions  . Bayer Advanced Aspirin [Aspirin] Nausea And Vomiting  . Penicillins Nausea And Vomiting    Family History:  Family History  Problem Relation Age of Onset  . Diabetes Mother   . Prostate cancer Father   . Diabetes Brother   . Diabetes Brother   . Hypertension Brother   . Hypertension Brother   . Hypertension Brother       Prior to Admission medications   Medication Sig Start Date End Date Taking? Authorizing Provider  amLODipine (NORVASC) 10 MG tablet TAKE ONE TABLET  BY MOUTH ONCE DAILY 07/26/14   [name], MD  aspirin EC 81 MG tablet Take 1 tablet (81 mg total) by mouth daily. 06/16/13   [name], MD  chlorthalidone (HYGROTON) 50 MG tablet Take 1 tablet (50 mg total) by mouth daily. 06/12/14   [name], MD  clopidogrel (PLAVIX) 75 MG tablet TAKE ONE TABLET BY MOUTH IN THE MORNING WITH BREAKFAST 02/27/14   [name], MD  hydrALAZINE (APRESOLINE) 25 MG tablet  10/09/14   Historical Provider, MD  hydrALAZINE (APRESOLINE) 50 MG tablet Take 1  tablet (50 mg total) by mouth 2 (two) times daily. 10/16/14   Herminio Commons, MD  insulin lispro protamine-insulin lispro (HUMALOG 75/25) (75-25) 100 UNIT/ML SUSP Inject 10 Units into the skin 2 (two) times daily. As directed    Historical Provider, MD  levETIRAcetam (KEPPRA) 500 MG tablet Take 250 mg by mouth at bedtime. Take ONE_HALF TAB BY MOUTH AT BEDTIME    Historical Provider, MD  levothyroxine (SYNTHROID, LEVOTHROID) 175 MCG tablet Take 175 mcg by mouth daily before breakfast.  04/27/14   Historical Provider, MD  lovastatin (MEVACOR) 40 MG tablet TAKE ONE TABLET BY MOUTH AT BEDTIME 07/26/14   [name], MD  Multiple Vitamins-Minerals (EYE VITAMINS PO) Take 1 tablet by mouth daily.     Historical Provider, MD  tamsulosin (FLOMAX) 0.4 MG CAPS capsule Take 0.4 mg by mouth.    Historical Provider, MD  Vitamin D, Ergocalciferol, (DRISDOL) 50000 UNITS CAPS capsule Take 50,000 Units by mouth every 7 (seven) days.  04/27/14   Historical Provider, MD    Physical Exam: Filed Vitals:   11/30/14 2330 12/01/14 0204 12/01/14 0206 12/01/14 0309  BP: 128/68 147/73 147/73 148/73  Pulse: 76  86 88  Temp:    98 F (36.7 C)  TempSrc:    Oral  Resp: 14 14 14 14   Height:    5\' 8"  (1.727 m)  Weight:    83.144 kg (183 lb 4.8 oz)  SpO2: 97%  95% 99%     General:  Well built and moderately nourished.  Eyes: Anicteric no pallor.  ENT: No discharge from the ears eyes nose are normal.  Neck: No mass felt.  Cardiovascular: S1 and S2 heard.  Respiratory: No rhonchi or crepitations.  Abdomen: Right upper quadrant tenderness with mild guarding and no rigidity. Bowel sounds present.  Skin: No rash.  Musculoskeletal: No edema.  Psychiatric: Appears normal.  Neurologic: Alert awake oriented to time place and person. Moves all extremities.  Labs on Admission:  Basic Metabolic Panel:  Recent Labs Lab 11/30/14 2307  NA 132*  K 3.8  CL 105  CO2 21  GLUCOSE 164*  BUN 53*   CREATININE 3.24*  CALCIUM 8.3*   Liver Function Tests:  Recent Labs Lab 11/30/14 2307  AST 27  ALT 20  ALKPHOS 75  BILITOT 0.4  PROT 6.6  ALBUMIN 3.0*    Recent Labs Lab 11/30/14 2307  LIPASE 24   No results for input(s): AMMONIA in the last 168 hours. CBC:  Recent Labs Lab 11/30/14 2307  WBC 15.2*  NEUTROABS 12.9*  HGB 8.8*  HCT 26.8*  MCV 94.0  PLT 213   Cardiac Enzymes:  Recent Labs Lab 11/30/14 2307  TROPONINI 0.05*    BNP (last 3 results) No results for input(s): PROBNP in the last 8760 hours. CBG: No results for input(s): GLUCAP in the last 168 hours.  Radiological Exams on Admission: Ct Abdomen  Pelvis Wo Contrast  12/01/2014   CLINICAL DATA:  Acute onset of abdominal pain and constipation for 3 days. Nausea. Initial encounter.  EXAM: CT ABDOMEN AND PELVIS WITHOUT CONTRAST  TECHNIQUE: Multidetector CT imaging of the abdomen and pelvis was performed following the standard protocol without IV contrast.  COMPARISON:  CT of the abdomen and pelvis from 08/10/2012, and renal ultrasound performed 03/20/2014  FINDINGS: Bibasilar atelectasis is noted, more prominent on the right, with trace right-sided pleural fluid.  A calcified granuloma is noted within the right hepatic lobe. The spleen is unremarkable.  There is mild soft tissue inflammation about the gallbladder, raising concern for mild acute cholecystitis. Stones are noted within the gallbladder. There is a 1.2 cm stone lodged at the neck of the gallbladder. No intrahepatic biliary ductal dilatation is seen. The common hepatic duct remains normal in caliber.  The pancreas and adrenal glands are unremarkable.  There is incomplete rotation of the left kidney. Mild left renal atrophy is noted. Left-sided pelvicaliectasis is noted, without definite evidence of hydronephrosis. Nonspecific right-sided perinephric stranding is seen. Poorly characterized cysts are noted at the upper pole of the left kidney.  No free  fluid is identified. The small bowel is unremarkable in appearance. The stomach is within normal limits. No acute vascular abnormalities are seen. Minimal calcification is noted at the distal abdominal aorta and its branches.  The appendix is mildly distended, but otherwise unremarkable. There is no definite evidence for appendicitis. The colon is partially filled with stool. Scattered diverticulosis is noted along the descending and proximal sigmoid colon, without evidence of diverticulitis.  The bladder is mildly distended and grossly unremarkable. A small urachal remnant is incidentally seen. The prostate is enlarged, measuring 5.3 cm in transverse dimension, with scattered calcification. Mildly enlarged bilateral inguinal nodes are seen, measuring up to 1.2 cm in short axis, with grossly normal fatty hila.  No acute osseous abnormalities are identified. There is chronic osseous fusion at L4-5, with associated endplate sclerotic change.  IMPRESSION: 1. Suspect acute cholecystitis, with mild soft tissue inflammation about the gallbladder, cholelithiasis and a 1.2 cm stone lodged at the neck of the gallbladder. 2. Mild left renal atrophy noted.  Suspect small left renal cysts. 3. Enlarged prostate noted. 4. Scattered diverticulosis along the descending and proximal sigmoid colon, without evidence of diverticulitis. 5. Mildly enlarged bilateral inguinal nodes, stable in appearance. 6. Bibasilar atelectasis, more prominent on the right, with trace right-sided pleural fluid.   Electronically Signed   By: Garald Balding M.D.   On: 12/01/2014 00:48   Dg Chest 2 View  11/30/2014   CLINICAL DATA:  Acute onset of weakness and vomiting. Right flank pain and constipation. Initial encounter.  EXAM: CHEST  2 VIEW  COMPARISON:  Chest radiograph from 09/07/2012  FINDINGS: The lungs are hypoexpanded. Mild bibasilar atelectasis is noted. There is no evidence of pleural effusion or pneumothorax.  The heart is mildly enlarged.  No acute osseous abnormalities are seen.  IMPRESSION: Lungs hypoexpanded, with mild bibasilar atelectasis. Mild cardiomegaly.   Electronically Signed   By: Garald Balding M.D.   On: 11/30/2014 23:54    EKG: Independently reviewed. Normal sinus rhythm with RBBB.  Assessment/Plan Active Problems:   Hyperlipidemia LDL goal <100   Cholecystitis   Essential hypertension   Elevated troponin   Renal failure (ARF), acute on chronic   Diabetes mellitus type 2, controlled   Acute cholecystitis   1. Acute cholecystitis - patient's clinical symptoms and CAT scan findings are  concerning for acute cholecystitis. At this time I have place patient nothing by mouth and on empiric antibiotics. Further recommendations per surgery. 2. Elevated troponin - patient denies any chest pain. At this time I will cycle cardiac markers and if troponin is elevated further cardiac markers then we will need cardiac consult. Check 2-D echo. Patient takes antiplatelet agents for history of stroke which is on hold for possible surgery but if troponin tends to remain high then we will need to continue antiplatelet agents and may need heparin. 3. Acute on chronic kidney disease stage III - gently hydrated and closely follow metabolic panel. 4. Diabetes mellitus type 2 - since patient is nothing by mouth I have place patient on sliding scale coverage for now and closely follow CBGs. 5. History of seizures - I have place patient on Keppra IV until patient can take orally. 6. Hypothyroidism - IV Synthroid. 7. Chronic anemia - follow CBC. Type and screen and hold. 8. Hyperlipidemia on statins.   DVT Prophylaxis patient is placed on SCDs in anticipation of possible surgery we will hold off Lovenox. Code Status: Full code.  Family Communication: Patient's family at the bedside.  Disposition Plan: Admit to inpatient.    Vale Peraza N. [facility] Pager [phone].  If 7PM-7AM, please contact  night-coverage [email or web address] Password [facility] 12/01/2014, 3:57 AM

## 2014-12-01 NOTE — ED Provider Notes (Signed)
1.2 cm gallstone lodged in gallbladder neck. Discussed with Dr. Laural Golden.  Discussed with Dr. Aviva Signs. Rx IV Cipro, IV Flagyl. Admit to general medicine. Discussed with Dr. Corie Chiquito, MD 12/01/14 [phone]

## 2014-12-02 ENCOUNTER — Encounter (HOSPITAL_COMMUNITY): Payer: Self-pay | Admitting: Cardiology

## 2014-12-02 DIAGNOSIS — E1165 Type 2 diabetes mellitus with hyperglycemia: Secondary | ICD-10-CM

## 2014-12-02 DIAGNOSIS — I452 Bifascicular block: Secondary | ICD-10-CM | POA: Diagnosis present

## 2014-12-02 DIAGNOSIS — E1129 Type 2 diabetes mellitus with other diabetic kidney complication: Secondary | ICD-10-CM

## 2014-12-02 LAB — TROPONIN I
Troponin I: 0.05 ng/mL — ABNORMAL HIGH (ref <0.031)
Troponin I: 0.05 ng/mL — ABNORMAL HIGH (ref <0.031)

## 2014-12-02 LAB — GLUCOSE, CAPILLARY
Glucose-Capillary: 115 mg/dL — ABNORMAL HIGH (ref 70–99)
Glucose-Capillary: 117 mg/dL — ABNORMAL HIGH (ref 70–99)
Glucose-Capillary: 117 mg/dL — ABNORMAL HIGH (ref 70–99)
Glucose-Capillary: 119 mg/dL — ABNORMAL HIGH (ref 70–99)
Glucose-Capillary: 129 mg/dL — ABNORMAL HIGH (ref 70–99)
Glucose-Capillary: 140 mg/dL — ABNORMAL HIGH (ref 70–99)

## 2014-12-02 LAB — COMPREHENSIVE METABOLIC PANEL
ALT: 22 U/L (ref 0–53)
AST: 28 U/L (ref 0–37)
Albumin: 2.4 g/dL — ABNORMAL LOW (ref 3.5–5.2)
Alkaline Phosphatase: 82 U/L (ref 39–117)
Anion gap: 5 (ref 5–15)
BUN: 37 mg/dL — ABNORMAL HIGH (ref 6–23)
CO2: 23 mmol/L (ref 19–32)
Calcium: 8.3 mg/dL — ABNORMAL LOW (ref 8.4–10.5)
Chloride: 104 mEq/L (ref 96–112)
Creatinine, Ser: 2.74 mg/dL — ABNORMAL HIGH (ref 0.50–1.35)
GFR calc Af Amer: 23 mL/min — ABNORMAL LOW (ref >90)
GFR calc non Af Amer: 20 mL/min — ABNORMAL LOW (ref >90)
Glucose, Bld: 126 mg/dL — ABNORMAL HIGH (ref 70–99)
Potassium: 4.2 mmol/L (ref 3.5–5.1)
Sodium: 132 mmol/L — ABNORMAL LOW (ref 135–145)
Total Bilirubin: 0.6 mg/dL (ref 0.3–1.2)
Total Protein: 6.1 g/dL (ref 6.0–8.3)

## 2014-12-02 LAB — CBC
HCT: 26.6 % — ABNORMAL LOW (ref 39.0–52.0)
Hemoglobin: 8.6 g/dL — ABNORMAL LOW (ref 13.0–17.0)
MCH: 29.8 pg (ref 26.0–34.0)
MCHC: 32.3 g/dL (ref 30.0–36.0)
MCV: 92 fL (ref 78.0–100.0)
Platelets: 227 10*3/uL (ref 150–400)
RBC: 2.89 MIL/uL — ABNORMAL LOW (ref 4.22–5.81)
RDW: 12.4 % (ref 11.5–15.5)
WBC: 11.5 10*3/uL — ABNORMAL HIGH (ref 4.0–10.5)

## 2014-12-02 LAB — SURGICAL PCR SCREEN
MRSA, PCR: NEGATIVE
Staphylococcus aureus: NEGATIVE

## 2014-12-02 MED ORDER — SODIUM CHLORIDE 0.9 % IV SOLN
INTRAVENOUS | Status: AC
  Administered 2014-12-02 – 2014-12-03 (×2): via INTRAVENOUS

## 2014-12-02 MED ORDER — TAMSULOSIN HCL 0.4 MG PO CAPS
0.4000 mg | ORAL_CAPSULE | Freq: Every day | ORAL | Status: DC
  Administered 2014-12-02 – 2014-12-06 (×5): 0.4 mg via ORAL
  Filled 2014-12-02 (×6): qty 1

## 2014-12-02 NOTE — H&P (Addendum)
PROGRESS NOTE    Chase [name] [id number] DOB: [birth year] DOA: 11/30/2014 PCP: Tula Nakayama, MD  HPI/Brief narrative 80 year old male patient with history of hyperlipidemia, PAD, seizures, CVA, diabetes mellitus with renal complications, stage IV chronic kidney disease (baseline creatinine 2.5), hypothyroid and hypertensive heart disease, presented initially to [facility] with right upper quadrant pain. He was diagnosed with acute cholecystitis and cholelithiasis. His case was discussed with GI and surgery at [facility] and family requested transfer to [facility]. Patient had minimally elevated troponins and EKG without acute changes. 2-D echo showed normal LVEF without wall motion abnormalities. Aspirin and Plavix were held. Surgery has consulted and planned intervention-laparoscopic cholecystectomy versus cholecystostomy placement on 12/28. Cardiology has seen and cleared for intervention.   Assessment/Plan:  1. Acute cholecystitis: General surgery input appreciated. Treating with NPO, IV fluids, IV antibiotics and pain management. Aspirin and Plavix have been on hold since 11/30/14. Surgery plans either cholecystectomy or percutaneous drainage on 12/28. Patient is at high risk for surgery due to advanced age, multiple significant comorbidities and current acute illness. Cardiology consultation appreciated-recommend no further cardiac workup. 2. Elevated troponin/demand ischemia: No reported chest pain. EKG without acute changes. 2-D echo with normal LV function and no wall motion abnormalities. Aspirin and Plavix held for possible intervention/procedure. Cardiology input appreciated and recommended no further workup prior to procedure. Elevated troponin likely secondary to demand ischemia from acute infection, acute on chronic kidney disease and significant anemia. 3. Acute on stage III chronic kidney disease: Improving with IV fluid hydration. Continue hydration and follow up  BMP in a.m. 4. 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 Continue NovoLog SSI. 5. History of seizures: Keppra switched to IV while patient nothing by mouth. 6. Hypothyroid: Continue IV Synthroid. 7. Chronic anemia: Stable. Follow CBCs and transfuse if hemoglobin less than 7 g per DL. 8. History of hyperlipidemia: Holding statins. 9. Leukocytosis: Secondary to problem #1. Follow CBCs.  10. Essential hypertension: Mildly uncontrolled. Holding by mouth amlodipine and hydralazine. When necessary IV hydralazine. 11. History of bifascicular block 12. History of BPH: Continue Flomax.   Code Status: Full Family Communication: Discussed with patient spouse and daughter at bedside. Disposition Plan: Home when medically stable.   Consultants:  General surgery  Cardiology  Procedures:  None  Antibiotics:  IV Cipro  IV metronidazole   Subjective: Continues to have abdominal pain. Passing flatus. No BM for 5-6 days. No nausea or vomiting reported. Denies history of chest pain, dyspnea or palpitations.  Objective: Filed Vitals:   12/01/14 1842 12/01/14 2140 12/02/14 0545 12/02/14 1437  BP: 143/66 150/79 149/59 161/72  Pulse:  79 80 71  Temp: 98.5 F (36.9 C) 100 F (37.8 C) 98.8 F (37.1 C) 98.6 F (37 C)  TempSrc: Oral Oral Oral Oral  Resp: 20 18 18 18   Height: 5\' 8"  (1.727 m)     Weight: 88 kg (194 lb 0.1 oz)     SpO2: 96% 99% 99% 99%    Intake/Output Summary (Last 24 hours) at 12/02/14 1555 Last data filed at 12/02/14 0545  Gross per 24 hour  Intake   1185 ml  Output   2375 ml  Net  -1190 ml   Filed Weights   11/30/14 2100 12/01/14 0309 12/01/14 1842  Weight: 85.73 kg (189 lb) 83.144 kg (183 lb 4.8 oz) 88 kg (194 lb 0.1 oz)     Exam:  General exam: Pleasant elderly male lying comfortably supine in bed. Respiratory  system: Clear. No increased work of breathing. Cardiovascular system: S1 & S2 heard, RRR. No JVD, murmurs,  gallops, clicks or pedal edema. Telemetry: Sinus rhythm with BBB morphology. Gastrointestinal system: Abdomen is nondistended, soft. Mild right upper quadrant tenderness without peritoneal signs. Normal bowel sounds heard. Central nervous system: Alert and oriented. No focal neurological deficits. Extremities: Symmetric 5 x 5 power.   Data Reviewed: Basic Metabolic Panel:  Recent Labs Lab 11/30/14 2307 12/01/14 0434 12/02/14 0425  NA 132* 134* 132*  K 3.8 3.8 4.2  CL 105 105 104  CO2 21 21 23   GLUCOSE 164* 141* 126*  BUN 53* 52* 37*  CREATININE 3.24* 3.04* 2.74*  CALCIUM 8.3* 8.4 8.3*   Liver Function Tests:  Recent Labs Lab 11/30/14 2307 12/01/14 0434 12/02/14 0425  AST 27 26 28   ALT 20 22 22   ALKPHOS 75 74 82  BILITOT 0.4 0.6 0.6  PROT 6.6 6.5 6.1  ALBUMIN 3.0* 2.9* 2.4*    Recent Labs Lab 11/30/14 2307  LIPASE 24   No results for input(s): AMMONIA in the last 168 hours. CBC:  Recent Labs Lab 11/30/14 2307 12/01/14 0434 12/02/14 1104  WBC 15.2* 14.8* 11.5*  NEUTROABS 12.9* 12.2*  --   HGB 8.8* 9.2* 8.6*  HCT 26.8* 28.0* 26.6*  MCV 94.0 94.3 92.0  PLT 213 220 227   Cardiac Enzymes:  Recent Labs Lab 12/01/14 0956 12/01/14 1558 12/01/14 2311 12/02/14 0425 12/02/14 1104  TROPONINI 0.04* 0.07* 0.05* 0.05* 0.05*   BNP (last 3 results) No results for input(s): PROBNP in the last 8760 hours. CBG:  Recent Labs Lab 12/01/14 2034 12/01/14 2311 12/02/14 0317 12/02/14 0821 12/02/14 1200  GLUCAP 190* 124* 117* 140* 119*    No results found for this or any previous visit (from the past 240 hour(s)).    Additional labs: 1. 2-D echo 12/01/14: Study Conclusions  - Left ventricle: The cavity size was normal. Wall thickness was increased in a pattern of moderate LVH. Systolic function was normal. The estimated ejection fraction was in the range of 55% to 60%. Wall motion was normal; there were no regional wall motion abnormalities.  Doppler parameters are consistent with abnormal left ventricular relaxation (grade 1 diastolic dysfunction). - Aortic valve: Valve area (VTI): 2.69 cm^2. Valve area (Vmax): 2.73 cm^2. Valve area (Vmean): 2.43 cm^2. - Mitral valve: There was mild regurgitation. - Pulmonary arteries: Systolic pressure was mildly increased. PA peak pressure: 41 mm Hg (S).      Studies: Ct Abdomen Pelvis Wo Contrast  12/01/2014   CLINICAL DATA:  Acute onset of abdominal pain and constipation for 3 days. Nausea. Initial encounter.  EXAM: CT ABDOMEN AND PELVIS WITHOUT CONTRAST  TECHNIQUE: Multidetector CT imaging of the abdomen and pelvis was performed following the standard protocol without IV contrast.  COMPARISON:  CT of the abdomen and pelvis from 08/10/2012, and renal ultrasound performed 03/20/2014  FINDINGS: Bibasilar atelectasis is noted, more prominent on the right, with trace right-sided pleural fluid.  A calcified granuloma is noted within the right hepatic lobe. The spleen is unremarkable.  There is mild soft tissue inflammation about the gallbladder, raising concern for mild acute cholecystitis. Stones are noted within the gallbladder. There is a 1.2 cm stone lodged at the neck of the gallbladder. No intrahepatic biliary ductal dilatation is seen. The common hepatic duct remains normal in caliber.  The pancreas and adrenal glands are unremarkable.  There is incomplete rotation of the left kidney. Mild left renal atrophy is noted.  Left-sided pelvicaliectasis is noted, without definite evidence of hydronephrosis. Nonspecific right-sided perinephric stranding is seen. Poorly characterized cysts are noted at the upper pole of the left kidney.  No free fluid is identified. The small bowel is unremarkable in appearance. The stomach is within normal limits. No acute vascular abnormalities are seen. Minimal calcification is noted at the distal abdominal aorta and its branches.  The appendix is mildly  distended, but otherwise unremarkable. There is no definite evidence for appendicitis. The colon is partially filled with stool. Scattered diverticulosis is noted along the descending and proximal sigmoid colon, without evidence of diverticulitis.  The bladder is mildly distended and grossly unremarkable. A small urachal remnant is incidentally seen. The prostate is enlarged, measuring 5.3 cm in transverse dimension, with scattered calcification. Mildly enlarged bilateral inguinal nodes are seen, measuring up to 1.2 cm in short axis, with grossly normal fatty hila.  No acute osseous abnormalities are identified. There is chronic osseous fusion at L4-5, with associated endplate sclerotic change.  IMPRESSION: 1. Suspect acute cholecystitis, with mild soft tissue inflammation about the gallbladder, cholelithiasis and a 1.2 cm stone lodged at the neck of the gallbladder. 2. Mild left renal atrophy noted.  Suspect small left renal cysts. 3. Enlarged prostate noted. 4. Scattered diverticulosis along the descending and proximal sigmoid colon, without evidence of diverticulitis. 5. Mildly enlarged bilateral inguinal nodes, stable in appearance. 6. Bibasilar atelectasis, more prominent on the right, with trace right-sided pleural fluid.   Electronically Signed   By: Garald Balding M.D.   On: 12/01/2014 00:48   Dg Chest 2 View  11/30/2014   CLINICAL DATA:  Acute onset of weakness and vomiting. Right flank pain and constipation. Initial encounter.  EXAM: CHEST  2 VIEW  COMPARISON:  Chest radiograph from 09/07/2012  FINDINGS: The lungs are hypoexpanded. Mild bibasilar atelectasis is noted. There is no evidence of pleural effusion or pneumothorax.  The heart is mildly enlarged. No acute osseous abnormalities are seen.  IMPRESSION: Lungs hypoexpanded, with mild bibasilar atelectasis. Mild cardiomegaly.   Electronically Signed   By: Garald Balding M.D.   On: 11/30/2014 23:54        Scheduled Meds: . ciprofloxacin  400  mg Intravenous Q24H  . insulin aspart  0-9 Units Subcutaneous Q4H  . levETIRAcetam  250 mg Intravenous QHS  . levothyroxine  87.5 mcg Intravenous QAC breakfast  . metronidazole  500 mg Intravenous Q8H   Continuous Infusions:   Active Problems:   Hyperlipidemia LDL goal <100   Hypertensive heart disease   Elevated troponin   Renal failure (ARF), acute on chronic   Diabetes mellitus type 2, controlled   Acute cholecystitis   RBBB (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 fascicular block)    Time spent: 40 minutes.    Vernell Leep, MD, FACP, FHM. [facility] Pager [phone]  If 7PM-7AM, please contact night-coverage [email or web address] Password [email or web address] 12/02/2014, 3:55 PM    LOS: 2 days

## 2014-12-02 NOTE — Consult Note (Addendum)
Cardiology Consult Note  Admit date: 11/30/2014 Name: Chase [name] 80 y.o.  male DOB:  03-17-1931 MRN:  [id number]  Today's date:  12/02/2014  Referring Physician:    [facility]  Primary Physician:   Dr. Tula Nakayama   Reason for Consultation:   Abnormal troponin, preoperative evaluation   IMPRESSIONS: 1.  From a cardiovascular viewpoint may proceed with the planned cholecystectomy, presumably to be done laparoscopically.  His risk of surgical complications is increased due to his renal failure, age, and multiple other comorbidities.  His cardiovascular status is stable at the present time and he does not need to have further cardiovascular workup prior to surgery.  His gallbladder is is pressing probably needs to be addressed. 2.  Peripheral vascular disease with previous stroke. 3.  Hypertensive heart disease. 4.  Hyperlipidemia. 5.  Stage IV chronic kidney disease. 6.  Diabetes mellitus with peripheral neuropathy 7.  Bifascicular block 8.  Very borderline elevation of troponin likely due to anemia and the presence of chronic kidney disease.  I do not think this is acute ischemia. 9.  Anemia  RECOMMENDATION: No further evaluation of the low level troponin elevation is necessary.  This could be due to anemia or the presence of renal disease.  May consider a myocardial perfusion scan remotely after he has recovered from his surgery.  However, his degree of renal dysfunction makes invasive evaluation difficult.  An risky so it may be best to just to treat him medically.  HISTORY: This 80 year old male was brought in with acute cholecystitis.  This is been smoldering for about a week.  He is complicated and has a prior history of stroke with some residual right arm weakness.  He also has a history of some peripheral vascular disease and stasis ulcers as well as spinal stenosis.  His functional status is somewhat limited at home.  He had a catheterization done 14 years ago that  did not show significant coronary artery disease.  He denies angina normally.  He does have peripheral neuropathy and has no PND, orthopnea, or claudication.  Past Medical History  Diagnosis Date  . Peripheral vascular disease, unspecified   . Depressive disorder, not elsewhere classified   . Obesity   . Other and unspecified hyperlipidemia   . Osteoarthrosis, unspecified whether generalized or localized, lower leg   . Spondylosis of unspecified site without mention of myelopathy   . Spinal stenosis, unspecified region other than cervical   . Seizures   . Complete lesion of cervical spinal cord 03/14/3012    Stable since 2006  . Lacunar stroke, acute 03/14/2012  . Bradycardia 03/15/2012  . Diabetic neuropathy   . CKD (chronic kidney disease) stage 4, GFR 15-29 ml/min   . Diabetes mellitus approx 1994  . Hypothyroidism approx 2000  . Hypertensive heart disease   . CVA (cerebrovascular accident) 09/07/12      Past Surgical History  Procedure Laterality Date  . Kidney stones left x2  1975  . Kidney surgery      Ruptured left kidney 30 yrs ago  from a kidney stone  . Colonoscopy N/A 08/10/2013    Procedure: COLONOSCOPY;  Surgeon: Rogene Houston, MD;  Location: [facility];  Service: Endoscopy;  Laterality: N/A;  240     Allergies:  is allergic to bayer advanced aspirin and penicillins.   Medications: Prior to Admission medications   Medication Sig Start Date End Date Taking? Authorizing Provider  amLODipine (NORVASC) 10 MG tablet TAKE ONE TABLET  BY MOUTH ONCE DAILY 07/26/14  Yes [name], MD  aspirin EC 81 MG tablet Take 1 tablet (81 mg total) by mouth daily. 06/16/13  Yes [name], MD  chlorthalidone (HYGROTON) 50 MG tablet Take 1 tablet (50 mg total) by mouth daily. 06/12/14  Yes [name], MD  clopidogrel (PLAVIX) 75 MG tablet TAKE ONE TABLET BY MOUTH IN THE MORNING WITH BREAKFAST Patient taking differently: Take 75 mg by mouth daily with breakfast.   02/27/14  Yes [name], MD  hydrALAZINE (APRESOLINE) 50 MG tablet Take 1 tablet (50 mg total) by mouth 2 (two) times daily. 10/16/14  Yes Herminio Commons, MD  insulin lispro protamine-insulin lispro (HUMALOG 75/25) (75-25) 100 UNIT/ML SUSP Inject 10 Units into the skin 2 (two) times daily. As directed   Yes Historical Provider, MD  levETIRAcetam (KEPPRA) 500 MG tablet Take 250 mg by mouth at bedtime.    Yes Historical Provider, MD  levothyroxine (SYNTHROID, LEVOTHROID) 175 MCG tablet Take 175 mcg by mouth daily before breakfast.  04/27/14  Yes Historical Provider, MD  lovastatin (MEVACOR) 40 MG tablet TAKE ONE TABLET BY MOUTH AT BEDTIME 07/26/14  Yes [name], MD  Multiple Vitamins-Minerals (EYE VITAMINS PO) Take 1 tablet by mouth daily.    Yes Historical Provider, MD  tamsulosin (FLOMAX) 0.4 MG CAPS capsule Take 0.4 mg by mouth.   Yes Historical Provider, MD  Vitamin D, Ergocalciferol, (DRISDOL) 50000 UNITS CAPS capsule Take 50,000 Units by mouth every 7 (seven) days. Takes on Sunday. 04/27/14  Yes Historical Provider, MD    Family History: Family Status  Relation Status Death Age  . Mother Deceased   . Father Deceased   . Sister [place] unknown  . Sister Alive   . Sister Alive   . Brother Alive   . Brother Deceased     unknown  . Brother Deceased     unknown  . Brother Deceased     CVA    Social History:   reports that he has never smoked. He has never used smokeless tobacco. He reports that he does not drink alcohol or use illicit drugs.   History   Social History Narrative    Review of Systems: Otherwise negative except as noted above.  Physical Exam: BP 149/59 mmHg  Pulse 80  Temp(Src) 98.8 F (37.1 C) (Oral)  Resp 18  Ht 5\' 8"  (1.727 m)  Wt 88 kg (194 lb 0.1 oz)  BMI 29.51 kg/m2  SpO2 99%  General appearance: Pleasant mildly obese black male in no acute distress Head: Normocephalic, without obvious abnormality, atraumatic Eyes:  conjunctivae/corneas clear. PERRL, EOM's intact. Fundi benign. Neck: no adenopathy, no carotid bruit, supple, symmetrical, trachea midline and JVD difficult to assess lying Lungs: clear to auscultation bilaterally Heart: regular rate and rhythm, S1, S2 normal, no murmur, click, rub or gallop Abdomen: Right upper quadrant tenderness with some guarding, no rebound Rectal: deferred Extremities: Trace edema, venous stasis changes noted Pulses: Peripheral pulses 1+ Neurologic: Some weakness of the right arm noted  Labs: CBC  Recent Labs  12/01/14 0434 12/02/14 1104  WBC 14.8* 11.5*  RBC 2.97* 2.89*  HGB 9.2* 8.6*  HCT 28.0* 26.6*  PLT 220 227  MCV 94.3 92.0  MCH 31.0 29.8  MCHC 32.9 32.3  RDW 12.7 12.4  LYMPHSABS 1.0  --   MONOABS 1.5*  --   EOSABS 0.1  --   BASOSABS 0.0  --  CMP   Recent Labs  12/02/14 0425  NA 132*  K 4.2  CL 104  CO2 23  GLUCOSE 126*  BUN 37*  CREATININE 2.74*  CALCIUM 8.3*  PROT 6.1  ALBUMIN 2.4*  AST 28  ALT 22  ALKPHOS 82  BILITOT 0.6  GFRNONAA 20*  GFRAA 23*   Cardiac Panel (last 3 results)  Recent Labs  12/01/14 2311 12/02/14 0425 12/02/14 1104  TROPONINI 0.05* 0.05* 0.05*    Radiology: Cardiomegaly with basilar atelectasis  EKG: Sinus rhythm with right bundle branch block and left anterior hemiblock, poor lateral R-wave progression  ECHO: Concentric LVH with preserved LV systolic function, grade 1 diastolic dysfunction  Signed:  Kerry Hough MD [facility]   Cardiology Consultant  12/02/2014, 2:12 PM

## 2014-12-02 NOTE — Progress Notes (Addendum)
Subjective: Patient is awake and appropriate but lethargic. Answers questions appropriately. Poor memory.   still has abdominal pain. According to patient and family, he last took Plavix Friday morning, December 25. Lab work this morning shows potassium 4.2, normal liver function test, troponin 0.05. Glucose 126. Objective: Vital signs in last 24 hours: Temp:  [98.5 F (36.9 C)-100 F (37.8 C)] 98.8 F (37.1 C) (12/27 0545) Pulse Rate:  [75-85] 80 (12/27 0545) Resp:  [18-20] 18 (12/27 0545) BP: (126-150)/(59-79) 149/59 mmHg (12/27 0545) SpO2:  [96 %-99 %] 99 % (12/27 0545) Weight:  [194 lb 0.1 oz (88 kg)] 194 lb 0.1 oz (88 kg) (12/26 1842) Last BM Date: 11/26/14  Intake/Output from previous day: 12/26 0701 - 12/27 0700 In: 1185 [P.O.:240; I.V.:845; IV Piggyback:100] Out: 2375 [Urine:2375] Intake/Output this shift: Total I/O In: -  Out: 1875 [Urine:1875]   Exam: General appearance: Lethargic but arousable and does carry on a conversation. Reasonable answers to questions but poor memory for specific details. Resp: Decreased breath sounds at bases but no rhonchi or wheeze. GI: abdomen reveals tenderness and guarding in the right upper quadrant and epigastric area. Less tender elsewhere. Cannot detect mass. No hernia detected.  Lab Results:   Recent Labs  11/30/14 2307 12/01/14 0434  WBC 15.2* 14.8*  HGB 8.8* 9.2*  HCT 26.8* 28.0*  PLT 213 220   BMET  Recent Labs  12/01/14 0434 12/02/14 0425  NA 134* 132*  K 3.8 4.2  CL 105 104  CO2 21 23  GLUCOSE 141* 126*  BUN 52* 37*  CREATININE 3.04* 2.74*  CALCIUM 8.4 8.3*   PT/INR No results for input(s): LABPROT, INR in the last 72 hours. ABG No results for input(s): PHART, HCO3 in the last 72 hours.  Invalid input(s): PCO2, PO2  Studies/Results: Ct Abdomen Pelvis Wo Contrast  12/01/2014   CLINICAL DATA:  Acute onset of abdominal pain and constipation for 3 days. Nausea. Initial encounter.  EXAM: CT ABDOMEN  AND PELVIS WITHOUT CONTRAST  TECHNIQUE: Multidetector CT imaging of the abdomen and pelvis was performed following the standard protocol without IV contrast.  COMPARISON:  CT of the abdomen and pelvis from 08/10/2012, and renal ultrasound performed 03/20/2014  FINDINGS: Bibasilar atelectasis is noted, more prominent on the right, with trace right-sided pleural fluid.  A calcified granuloma is noted within the right hepatic lobe. The spleen is unremarkable.  There is mild soft tissue inflammation about the gallbladder, raising concern for mild acute cholecystitis. Stones are noted within the gallbladder. There is a 1.2 cm stone lodged at the neck of the gallbladder. No intrahepatic biliary ductal dilatation is seen. The common hepatic duct remains normal in caliber.  The pancreas and adrenal glands are unremarkable.  There is incomplete rotation of the left kidney. Mild left renal atrophy is noted. Left-sided pelvicaliectasis is noted, without definite evidence of hydronephrosis. Nonspecific right-sided perinephric stranding is seen. Poorly characterized cysts are noted at the upper pole of the left kidney.  No free fluid is identified. The small bowel is unremarkable in appearance. The stomach is within normal limits. No acute vascular abnormalities are seen. Minimal calcification is noted at the distal abdominal aorta and its branches.  The appendix is mildly distended, but otherwise unremarkable. There is no definite evidence for appendicitis. The colon is partially filled with stool. Scattered diverticulosis is noted along the descending and proximal sigmoid colon, without evidence of diverticulitis.  The bladder is mildly distended and grossly unremarkable. A small urachal remnant is incidentally  seen. The prostate is enlarged, measuring 5.3 cm in transverse dimension, with scattered calcification. Mildly enlarged bilateral inguinal nodes are seen, measuring up to 1.2 cm in short axis, with grossly normal fatty  hila.  No acute osseous abnormalities are identified. There is chronic osseous fusion at L4-5, with associated endplate sclerotic change.  IMPRESSION: 1. Suspect acute cholecystitis, with mild soft tissue inflammation about the gallbladder, cholelithiasis and a 1.2 cm stone lodged at the neck of the gallbladder. 2. Mild left renal atrophy noted.  Suspect small left renal cysts. 3. Enlarged prostate noted. 4. Scattered diverticulosis along the descending and proximal sigmoid colon, without evidence of diverticulitis. 5. Mildly enlarged bilateral inguinal nodes, stable in appearance. 6. Bibasilar atelectasis, more prominent on the right, with trace right-sided pleural fluid.   Electronically Signed   By: Garald Balding M.D.   On: 12/01/2014 00:48   Dg Chest 2 View  11/30/2014   CLINICAL DATA:  Acute onset of weakness and vomiting. Right flank pain and constipation. Initial encounter.  EXAM: CHEST  2 VIEW  COMPARISON:  Chest radiograph from 09/07/2012  FINDINGS: The lungs are hypoexpanded. Mild bibasilar atelectasis is noted. There is no evidence of pleural effusion or pneumothorax.  The heart is mildly enlarged. No acute osseous abnormalities are seen.  IMPRESSION: Lungs hypoexpanded, with mild bibasilar atelectasis. Mild cardiomegaly.   Electronically Signed   By: Garald Balding M.D.   On: 11/30/2014 23:54    Anti-infectives: Anti-infectives    Start     Dose/Rate Route Frequency Ordered Stop   12/02/14 0200  ciprofloxacin (CIPRO) IVPB 400 mg     400 mg200 mL/hr over 60 Minutes Intravenous Every 24 hours 12/01/14 1033     12/01/14 0400  metroNIDAZOLE (FLAGYL) IVPB 500 mg     500 mg100 mL/hr over 60 Minutes Intravenous Every 8 hours 12/01/14 0356     12/01/14 0230  metroNIDAZOLE (FLAGYL) IVPB 500 mg  Status:  Discontinued     500 mg100 mL/hr over 60 Minutes Intravenous  Once 12/01/14 0218 12/01/14 0401   12/01/14 0215  ciprofloxacin (CIPRO) IVPB 400 mg     400 mg200 mL/hr over 60 Minutes Intravenous   Once 12/01/14 0207 12/01/14 0321      Assessment/Plan:  Acute cholecystitis with significant pain and tenderness. He has been symptomatic for 6 days or so and I suspect the gallbladder is significantly inflamed. CT scan corroborates this. Continue antibiotics. Npo after midnight. I told the patient and family that we would most likely need to proceed with invasive intervention tomorrow, either cholecystectomy or perc.  drainage.  We will need to hold off on any invasive procedure until Monday. Options will be cholecystectomy or percutaneous drainage depending on risk factors. Continue to hold Plavix Clearly we need opinion from medicine regarding risk stratification for general anesthesia. This will need to be completed today.  Acute on chronic kidney disease Diabetes mellitus History of CVA  complex partial seizure disorder   anemia of chronic disease Current anticoagulation on Plavix, last dose 2 days ago by history   hypertension Elevated troponin, significance unknown   LOS: 2 days    Oliviarose Punch M 12/02/2014

## 2014-12-03 LAB — GLUCOSE, CAPILLARY
Glucose-Capillary: 149 mg/dL — ABNORMAL HIGH (ref 70–99)
Glucose-Capillary: 104 mg/dL — ABNORMAL HIGH (ref 70–99)
Glucose-Capillary: 105 mg/dL — ABNORMAL HIGH (ref 70–99)
Glucose-Capillary: 116 mg/dL — ABNORMAL HIGH (ref 70–99)
Glucose-Capillary: 119 mg/dL — ABNORMAL HIGH (ref 70–99)
Glucose-Capillary: 113 mg/dL — ABNORMAL HIGH (ref 70–99)

## 2014-12-03 LAB — COMPREHENSIVE METABOLIC PANEL
ALT: 38 U/L (ref 0–53)
AST: 70 U/L — ABNORMAL HIGH (ref 0–37)
Albumin: 2.4 g/dL — ABNORMAL LOW (ref 3.5–5.2)
Alkaline Phosphatase: 222 U/L — ABNORMAL HIGH (ref 39–117)
Anion gap: 7 (ref 5–15)
BUN: 38 mg/dL — ABNORMAL HIGH (ref 6–23)
CO2: 20 mmol/L (ref 19–32)
Calcium: 8.3 mg/dL — ABNORMAL LOW (ref 8.4–10.5)
Chloride: 108 mEq/L (ref 96–112)
Creatinine, Ser: 2.66 mg/dL — ABNORMAL HIGH (ref 0.50–1.35)
GFR calc Af Amer: 24 mL/min — ABNORMAL LOW (ref >90)
GFR calc non Af Amer: 21 mL/min — ABNORMAL LOW (ref >90)
Glucose, Bld: 113 mg/dL — ABNORMAL HIGH (ref 70–99)
Potassium: 4.1 mmol/L (ref 3.5–5.1)
Sodium: 135 mmol/L (ref 135–145)
Total Bilirubin: 0.8 mg/dL (ref 0.3–1.2)
Total Protein: 5.9 g/dL — ABNORMAL LOW (ref 6.0–8.3)

## 2014-12-03 LAB — CBC
HCT: 27.5 % — ABNORMAL LOW (ref 39.0–52.0)
Hemoglobin: 9.2 g/dL — ABNORMAL LOW (ref 13.0–17.0)
MCH: 31.4 pg (ref 26.0–34.0)
MCHC: 33.5 g/dL (ref 30.0–36.0)
MCV: 93.9 fL (ref 78.0–100.0)
Platelets: 244 10*3/uL (ref 150–400)
RBC: 2.93 MIL/uL — ABNORMAL LOW (ref 4.22–5.81)
RDW: 12.5 % (ref 11.5–15.5)
WBC: 11.9 10*3/uL — ABNORMAL HIGH (ref 4.0–10.5)

## 2014-12-03 LAB — LIPASE, BLOOD: Lipase: 19 U/L (ref 11–59)

## 2014-12-03 MED ORDER — SODIUM CHLORIDE 0.9 % IV SOLN
INTRAVENOUS | Status: DC
  Administered 2014-12-03 – 2014-12-04 (×2): via INTRAVENOUS

## 2014-12-03 NOTE — H&P (Deleted)
PROGRESS NOTE    Chase [name] [id number] DOB: 12-08-1930 DOA: 11/30/2014 PCP: Tula Nakayama, MD  HPI/Brief narrative 80 year old male patient with history of hyperlipidemia, PAD, seizures, CVA, diabetes mellitus with renal complications, stage IV chronic kidney disease (baseline creatinine 2.5), hypothyroid and hypertensive heart disease, presented initially to [facility] with right upper quadrant pain. He was diagnosed with acute cholecystitis and cholelithiasis. His case was discussed with GI and surgery at [facility] and family requested transfer to [facility]. Patient had minimally elevated troponins and EKG without acute changes. 2-D echo showed normal LVEF without wall motion abnormalities. Aspirin and Plavix were held. Surgery has consulted and planned intervention-laparoscopic cholecystectomy versus cholecystostomy placement on 12/28. Cardiology has seen and cleared for intervention.   Assessment/Plan:  1. Acute cholecystitis: General surgery input appreciated. Treating with NPO, IV fluids, IV antibiotics and pain management. Aspirin and Plavix have been on hold since 11/30/14. Patient is at high risk for surgery due to advanced age, multiple significant comorbidities and current acute illness. Cardiology consultation appreciated-recommend no further cardiac workup. As per surgery, patient at high risk for surgical complications hence recommending percutaneous drain. This M.D. and the surgical team has discussed at length with patient and extended family at bedside. 2. Elevated troponin/demand ischemia: No reported chest pain. EKG without acute changes. 2-D echo with normal LV function and no wall motion abnormalities. Aspirin and Plavix held for possible intervention/procedure. Cardiology input appreciated and recommended no further workup prior to procedure. Elevated troponin likely secondary to demand ischemia from acute infection, acute on chronic kidney disease and  significant anemia. 3. Acute on stage III chronic kidney disease: Improving with IV fluid hydration. Continue hydration and follow up BMP in a.m. creatinine continues to improve. 4. 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 Continue NovoLog SSI. 5. History of seizures: Keppra switched to IV while patient nothing by mouth. 6. Hypothyroid: Continue IV Synthroid. 7. Chronic anemia: Stable. Follow CBCs and transfuse if hemoglobin less than 7 g per DL. 8. History of hyperlipidemia: Holding statins. 9. Leukocytosis: Secondary to problem #1. Follow CBCs.  10. Essential hypertension: Mildly uncontrolled. Holding by mouth amlodipine and hydralazine. When necessary IV hydralazine. 11. History of bifascicular block 12. History of BPH: Continue Flomax.   Code Status: Full Family Communication: Discussed with patient spouse and daughter at bedside. Disposition Plan: Home when medically stable.   Consultants:  General surgery  Cardiology  Procedures:  None  Antibiotics:  IV Cipro  IV metronidazole   Subjective: Patient states that abdominal pain is controlled with medications. Passing flatus. No BM. Family have a lot of questions regarding procedure.  Objective: Filed Vitals:   12/02/14 1437 12/02/14 2242 12/03/14 0521 12/03/14 1359  BP: 161/72 152/67 142/63 145/53  Pulse: 71  68 69  Temp: 98.6 F (37 C) 98.4 F (36.9 C) 98.9 F (37.2 C) 99.3 F (37.4 C)  TempSrc: Oral Oral Oral Oral  Resp: 18 12 15 16   Height:      Weight:      SpO2: 99% 92% 97% 97%    Intake/Output Summary (Last 24 hours) at 12/03/14 1747 Last data filed at 12/03/14 1633  Gross per 24 hour  Intake      0 ml  Output   2500 ml  Net  -2500 ml   Filed Weights   11/30/14 2100 12/01/14 0309 12/01/14 1842  Weight: 85.73 kg (189 lb) 83.144 kg (183 lb 4.8 oz) 88 kg (194 lb 0.1 oz)  Exam:  General exam: Pleasant elderly male lying comfortably supine in  bed. Respiratory system: Clear. No increased work of breathing. Cardiovascular system: S1 & S2 heard, RRR. No JVD, murmurs, gallops, clicks or pedal edema. Telemetry: Sinus rhythm with BBB morphology. Gastrointestinal system: Abdomen is nondistended, soft. Mild right upper quadrant tenderness without peritoneal signs. Normal bowel sounds heard. Central nervous system: Alert and oriented. No focal neurological deficits. Extremities: Symmetric 5 x 5 power.   Data Reviewed: Basic Metabolic Panel:  Recent Labs Lab 11/30/14 2307 12/01/14 0434 12/02/14 0425 12/03/14 0839  NA 132* 134* 132* 135  K 3.8 3.8 4.2 4.1  CL 105 105 104 108  CO2 21 21 23 20   GLUCOSE 164* 141* 126* 113*  BUN 53* 52* 37* 38*  CREATININE 3.24* 3.04* 2.74* 2.66*  CALCIUM 8.3* 8.4 8.3* 8.3*   Liver Function Tests:  Recent Labs Lab 11/30/14 2307 12/01/14 0434 12/02/14 0425 12/03/14 0839  AST 27 26 28  70*  ALT 20 22 22  38  ALKPHOS 75 74 82 222*  BILITOT 0.4 0.6 0.6 0.8  PROT 6.6 6.5 6.1 5.9*  ALBUMIN 3.0* 2.9* 2.4* 2.4*    Recent Labs Lab 11/30/14 2307 12/03/14 0839  LIPASE 24 19   No results for input(s): AMMONIA in the last 168 hours. CBC:  Recent Labs Lab 11/30/14 2307 12/01/14 0434 12/02/14 1104 12/03/14 0801  WBC 15.2* 14.8* 11.5* 11.9*  NEUTROABS 12.9* 12.2*  --   --   HGB 8.8* 9.2* 8.6* 9.2*  HCT 26.8* 28.0* 26.6* 27.5*  MCV 94.0 94.3 92.0 93.9  PLT 213 220 227 244   Cardiac Enzymes:  Recent Labs Lab 12/01/14 0956 12/01/14 1558 12/01/14 2311 12/02/14 0425 12/02/14 1104  TROPONINI 0.04* 0.07* 0.05* 0.05* 0.05*   BNP (last 3 results) No results for input(s): PROBNP in the last 8760 hours. CBG:  Recent Labs Lab 12/02/14 2347 12/03/14 0400 12/03/14 0805 12/03/14 1158 12/03/14 1617  GLUCAP 117* 149* 104* 105* 116*    Recent Results (from the past 240 hour(s))  Surgical pcr screen     Status: None   Collection Time: 12/02/14  7:34 PM  Result Value Ref Range  Status   MRSA, PCR NEGATIVE NEGATIVE Final   Staphylococcus aureus NEGATIVE NEGATIVE Final    Comment:        The Xpert SA Assay (FDA approved for NASAL specimens in patients over 61 years of age), is one component of a comprehensive surveillance program.  Test performance has been validated by [employer] for patients greater than or equal to 77 year old. It is not intended to diagnose infection nor to guide or monitor treatment.       Additional labs: 1. 2-D echo 12/01/14: Study Conclusions  - Left ventricle: The cavity size was normal. Wall thickness was increased in a pattern of moderate LVH. Systolic function was normal. The estimated ejection fraction was in the range of 55% to 60%. Wall motion was normal; there were no regional wall motion abnormalities. Doppler parameters are consistent with abnormal left ventricular relaxation (grade 1 diastolic dysfunction). - Aortic valve: Valve area (VTI): 2.69 cm^2. Valve area (Vmax): 2.73 cm^2. Valve area (Vmean): 2.43 cm^2. - Mitral valve: There was mild regurgitation. - Pulmonary arteries: Systolic pressure was mildly increased. PA peak pressure: 41 mm Hg (S).      Studies: No results found.      Scheduled Meds: . ciprofloxacin  400 mg Intravenous Q24H  . insulin aspart  0-9 Units Subcutaneous Q4H  .  levETIRAcetam  250 mg Intravenous QHS  . levothyroxine  87.5 mcg Intravenous QAC breakfast  . metronidazole  500 mg Intravenous Q8H  . tamsulosin  0.4 mg Oral Daily   Continuous Infusions:   Active Problems:   Hyperlipidemia LDL goal <100   Hypertensive heart disease   Elevated troponin   Renal failure (ARF), acute on chronic   Diabetes mellitus type 2, controlled   Acute cholecystitis   RBBB (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 fascicular block)    Time spent: 40 minutes.    Vernell Leep, MD, FACP, FHM. [facility] Pager [phone]  If 7PM-7AM, please contact  night-coverage [email or web address] Password [email or web address] 12/03/2014, 5:47 PM    LOS: 3 days

## 2014-12-03 NOTE — Consult Note (Addendum)
WOC wound consult note Reason for Consult: Consult requested for left leg wound.  Pt is followed as an outpatient by vascular team according to EMR and wears compression wraps which are changed Q week.  He recently had an Apligraf applied to the wound, pt states. Pt appears to be well-informed regarding topical treatment. Wound type: Right inner ankle with full thickness chronic wound with Apligraft well-adhered over wound bed. Measurement: .3X.3X.1cm Wound bed: pink dry center of wound surrounded by dry yellow intact Apligraft. Drainage (amount, consistency, odor) No odor or drainage Periwound: Intact skin surrounding Dressing procedure/placement/frequency: Continue present plan of care; applied nonadherent dressing to protect wound bed and 4 layer Profore compression wrap applied to LLE.  Plan to change next Monday if patient still in the hospital at that time; otherwise he can follow-up with VVS team after discharge. Please re-consult if further assistance is needed.  Thank-you,  Julien Girt MSN, [place], [place], [place], [place]

## 2014-12-03 NOTE — Consult Note (Signed)
Reason for consult: percutaneous cholecystostomy  Referring Physician(s): Dr. Dennis Bast  History of Present Illness: Chase [name] is a 80 y.o. male with multiple medical problems including PVD,CVA (on OP plavix- LD 12/25), CKD, DM, HTN . He recently has complained of persistent RUQ pain, nausea, loss of appetite, constipation, weight loss and weakness. Subsequent imaging revealed  findings c/w acute cholecystitis. Pt was deemed high surgical risk for cholecystectomy at this time and request now received for percutaneous cholecystostomy.  Past Medical History  Diagnosis Date  . Peripheral vascular disease, unspecified   . Depressive disorder, not elsewhere classified   . Obesity   . Other and unspecified hyperlipidemia   . Osteoarthrosis, unspecified whether generalized or localized, lower leg   . Spondylosis of unspecified site without mention of myelopathy   . Spinal stenosis, unspecified region other than cervical   . Seizures   . Complete lesion of cervical spinal cord 03/14/3012    Stable since 2006  . Lacunar stroke, acute 03/14/2012  . Bradycardia 03/15/2012  . Diabetic neuropathy   . CKD (chronic kidney disease) stage 4, GFR 15-29 ml/min   . Diabetes mellitus approx 1994  . Hypothyroidism approx 2000  . Hypertensive heart disease   . CVA (cerebrovascular accident) 09/07/12    Past Surgical History  Procedure Laterality Date  . Kidney stones left x2  1975  . Kidney surgery      Ruptured left kidney 30 yrs ago  from a kidney stone  . Colonoscopy N/A 08/10/2013    Procedure: COLONOSCOPY;  Surgeon: Rogene Houston, MD;  Location: [facility];  Service: Endoscopy;  Laterality: N/A;  240    Allergies: Bayer advanced aspirin and Penicillins  Medications: Prior to Admission medications   Medication Sig Start Date End Date Taking? Authorizing Provider  amLODipine (NORVASC) 10 MG tablet TAKE ONE TABLET BY MOUTH ONCE DAILY 07/26/14  Yes [name], MD  aspirin EC 81  MG tablet Take 1 tablet (81 mg total) by mouth daily. 06/16/13  Yes [name], MD  chlorthalidone (HYGROTON) 50 MG tablet Take 1 tablet (50 mg total) by mouth daily. 06/12/14  Yes [name], MD  clopidogrel (PLAVIX) 75 MG tablet TAKE ONE TABLET BY MOUTH IN THE MORNING WITH BREAKFAST Patient taking differently: Take 75 mg by mouth daily with breakfast.  02/27/14  Yes [name], MD  hydrALAZINE (APRESOLINE) 50 MG tablet Take 1 tablet (50 mg total) by mouth 2 (two) times daily. 10/16/14  Yes Herminio Commons, MD  insulin lispro protamine-insulin lispro (HUMALOG 75/25) (75-25) 100 UNIT/ML SUSP Inject 10 Units into the skin 2 (two) times daily. As directed   Yes Historical Provider, MD  levETIRAcetam (KEPPRA) 500 MG tablet Take 250 mg by mouth at bedtime.    Yes Historical Provider, MD  levothyroxine (SYNTHROID, LEVOTHROID) 175 MCG tablet Take 175 mcg by mouth daily before breakfast.  04/27/14  Yes Historical Provider, MD  lovastatin (MEVACOR) 40 MG tablet TAKE ONE TABLET BY MOUTH AT BEDTIME 07/26/14  Yes [name], MD  Multiple Vitamins-Minerals (EYE VITAMINS PO) Take 1 tablet by mouth daily.    Yes Historical Provider, MD  tamsulosin (FLOMAX) 0.4 MG CAPS capsule Take 0.4 mg by mouth.   Yes Historical Provider, MD  Vitamin D, Ergocalciferol, (DRISDOL) 50000 UNITS CAPS capsule Take 50,000 Units by mouth every 7 (seven) days. Takes on Sunday. 04/27/14  Yes Historical Provider, MD    Family History  Problem Relation Age  of Onset  . Diabetes Mother   . Prostate cancer Father   . Diabetes Brother   . Diabetes Brother   . Hypertension Brother   . Hypertension Brother   . Hypertension Brother     History   Social History  . Marital Status: Married    Spouse Name: N/A    Number of Children: 22  . Years of Education: N/A   Occupational History  . retired     Social History Main Topics  . Smoking status: Never Smoker   . Smokeless tobacco: Never Used  . Alcohol  Use: No  . Drug Use: No  . Sexual Activity: No   Other Topics Concern  . None   Social History Narrative        Review of Systems see above  Vital Signs: BP 145/53 mmHg  Pulse 69  Temp(Src) 99.3 F (37.4 C) (Oral)  Resp 16  Ht 5\' 8"  (1.727 m)  Wt 194 lb 0.1 oz (88 kg)  BMI 29.51 kg/m2  SpO2 97%  Physical Exam pt awake/answers questions appropriately; memory poor; chest- sl dim BS bases; heart- RRR; abd- soft,+BS, mod tender RUQ; ext - trace edema  Imaging: Ct Abdomen Pelvis Wo Contrast  12/01/2014   CLINICAL DATA:  Acute onset of abdominal pain and constipation for 3 days. Nausea. Initial encounter.  EXAM: CT ABDOMEN AND PELVIS WITHOUT CONTRAST  TECHNIQUE: Multidetector CT imaging of the abdomen and pelvis was performed following the standard protocol without IV contrast.  COMPARISON:  CT of the abdomen and pelvis from 08/10/2012, and renal ultrasound performed 03/20/2014  FINDINGS: Bibasilar atelectasis is noted, more prominent on the right, with trace right-sided pleural fluid.  A calcified granuloma is noted within the right hepatic lobe. The spleen is unremarkable.  There is mild soft tissue inflammation about the gallbladder, raising concern for mild acute cholecystitis. Stones are noted within the gallbladder. There is a 1.2 cm stone lodged at the neck of the gallbladder. No intrahepatic biliary ductal dilatation is seen. The common hepatic duct remains normal in caliber.  The pancreas and adrenal glands are unremarkable.  There is incomplete rotation of the left kidney. Mild left renal atrophy is noted. Left-sided pelvicaliectasis is noted, without definite evidence of hydronephrosis. Nonspecific right-sided perinephric stranding is seen. Poorly characterized cysts are noted at the upper pole of the left kidney.  No free fluid is identified. The small bowel is unremarkable in appearance. The stomach is within normal limits. No acute vascular abnormalities are seen. Minimal  calcification is noted at the distal abdominal aorta and its branches.  The appendix is mildly distended, but otherwise unremarkable. There is no definite evidence for appendicitis. The colon is partially filled with stool. Scattered diverticulosis is noted along the descending and proximal sigmoid colon, without evidence of diverticulitis.  The bladder is mildly distended and grossly unremarkable. A small urachal remnant is incidentally seen. The prostate is enlarged, measuring 5.3 cm in transverse dimension, with scattered calcification. Mildly enlarged bilateral inguinal nodes are seen, measuring up to 1.2 cm in short axis, with grossly normal fatty hila.  No acute osseous abnormalities are identified. There is chronic osseous fusion at L4-5, with associated endplate sclerotic change.  IMPRESSION: 1. Suspect acute cholecystitis, with mild soft tissue inflammation about the gallbladder, cholelithiasis and a 1.2 cm stone lodged at the neck of the gallbladder. 2. Mild left renal atrophy noted.  Suspect small left renal cysts. 3. Enlarged prostate noted. 4. Scattered diverticulosis along the descending and  proximal sigmoid colon, without evidence of diverticulitis. 5. Mildly enlarged bilateral inguinal nodes, stable in appearance. 6. Bibasilar atelectasis, more prominent on the right, with trace right-sided pleural fluid.   Electronically Signed   By: Garald Balding M.D.   On: 12/01/2014 00:48   Dg Chest 2 View  11/30/2014   CLINICAL DATA:  Acute onset of weakness and vomiting. Right flank pain and constipation. Initial encounter.  EXAM: CHEST  2 VIEW  COMPARISON:  Chest radiograph from 09/07/2012  FINDINGS: The lungs are hypoexpanded. Mild bibasilar atelectasis is noted. There is no evidence of pleural effusion or pneumothorax.  The heart is mildly enlarged. No acute osseous abnormalities are seen.  IMPRESSION: Lungs hypoexpanded, with mild bibasilar atelectasis. Mild cardiomegaly.   Electronically Signed   By:  Garald Balding M.D.   On: 11/30/2014 23:54    Labs:  CBC:  Recent Labs  11/30/14 2307 12/01/14 0434 12/02/14 1104 12/03/14 0801  WBC 15.2* 14.8* 11.5* 11.9*  HGB 8.8* 9.2* 8.6* 9.2*  HCT 26.8* 28.0* 26.6* 27.5*  PLT 213 220 227 244    COAGS: No results for input(s): INR, APTT in the last 8760 hours.  BMP:  Recent Labs  11/30/14 2307 12/01/14 0434 12/02/14 0425 12/03/14 0839  NA 132* 134* 132* 135  K 3.8 3.8 4.2 4.1  CL 105 105 104 108  CO2 21 21 23 20   GLUCOSE 164* 141* 126* 113*  BUN 53* 52* 37* 38*  CALCIUM 8.3* 8.4 8.3* 8.3*  CREATININE 3.24* 3.04* 2.74* 2.66*  GFRNONAA 17* 18* 20* 21*  GFRAA 19* 21* 23* 24*    LIVER FUNCTION TESTS:  Recent Labs  11/30/14 2307 12/01/14 0434 12/02/14 0425 12/03/14 0839  BILITOT 0.4 0.6 0.6 0.8  AST 27 26 28  70*  ALT 20 22 22  38  ALKPHOS 75 74 82 222*  PROT 6.6 6.5 6.1 5.9*  ALBUMIN 3.0* 2.9* 2.4* 2.4*    TUMOR MARKERS: No results for input(s): AFPTM, CEA, CA199, CHROMGRNA in the last 8760 hours.  Assessment and Plan: Pt with 1 week hx of persistent RUQ abd pain, intermittent nausea, weakness, constipation; now with mild leukocytosis, findings c/w acute cholecystitis on imaging. Pt is high risk surgical candidate due to multiple comorbidities as listed above. Case was reviewed by Dr. Earleen Newport and he feels we should allow pt to remain off plavix for recommended time and perform procedure on 12/30 to minimize associated bleeding risks. Details/risks of procedure d/w pt/family with their understanding and consent. CCS aware.      I spent a total of 40 minutes face to face in clinical consultation, greater than 50% of which was counseling/coordinating care for percutaneous cholecystostomy  Signed: Autumn Messing 12/03/2014, 6:19 PM

## 2014-12-03 NOTE — Progress Notes (Signed)
ANTIBIOTIC CONSULT NOTE - FOLLOW UP  Pharmacy Consult for Cipro Indication: Cholecystitis  Allergies  Allergen Reactions  . Bayer Advanced Aspirin [Aspirin] Nausea And Vomiting  . Penicillins Nausea And Vomiting    Patient Measurements: Height: 5\' 8"  (172.7 cm) Weight: 194 lb 0.1 oz (88 kg) IBW/kg (Calculated) : 68.4 Adjusted Body Weight:   Vital Signs: Temp: 98.9 F (37.2 C) (12/28 0521) Temp Source: Oral (12/28 0521) BP: 142/63 mmHg (12/28 0521) Pulse Rate: 68 (12/28 0521) Intake/Output from previous day: 12/27 0701 - 12/28 0700 In: 175 [I.V.:75; IV Piggyback:100] Out: 1500 [Urine:1500] Intake/Output from this shift: Total I/O In: 0  Out: 500 [Urine:500]  Labs:  Recent Labs  12/01/14 0434 12/02/14 0425 12/02/14 1104 12/03/14 0801 12/03/14 0839  WBC 14.8*  --  11.5* 11.9*  --   HGB 9.2*  --  8.6* 9.2*  --   PLT 220  --  227 244  --   CREATININE 3.04* 2.74*  --   --  2.66*   Estimated Creatinine Clearance: 23.5 mL/min (by C-G formula based on Cr of 2.66). No results for input(s): VANCOTROUGH, VANCOPEAK, VANCORANDOM, GENTTROUGH, GENTPEAK, GENTRANDOM, TOBRATROUGH, TOBRAPEAK, TOBRARND, AMIKACINPEAK, AMIKACINTROU, AMIKACIN in the last 72 hours.   Microbiology: Recent Results (from the past 720 hour(s))  Surgical pcr screen     Status: None   Collection Time: 12/02/14  7:34 PM  Result Value Ref Range Status   MRSA, PCR NEGATIVE NEGATIVE Final   Staphylococcus aureus NEGATIVE NEGATIVE Final    Comment:        The Xpert SA Assay (FDA approved for NASAL specimens in patients over 17 years of age), is one component of a comprehensive surveillance program.  Test performance has been validated by [employer] for patients greater than or equal to 29 year old. It is not intended to diagnose infection nor to guide or monitor treatment.     Anti-infectives    Start     Dose/Rate Route Frequency Ordered Stop   12/02/14 0200  ciprofloxacin (CIPRO) IVPB 400 mg      400 mg200 mL/hr over 60 Minutes Intravenous Every 24 hours 12/01/14 1033     12/01/14 0400  metroNIDAZOLE (FLAGYL) IVPB 500 mg     500 mg100 mL/hr over 60 Minutes Intravenous Every 8 hours 12/01/14 0356     12/01/14 0230  metroNIDAZOLE (FLAGYL) IVPB 500 mg  Status:  Discontinued     500 mg100 mL/hr over 60 Minutes Intravenous  Once 12/01/14 0218 12/01/14 0401   12/01/14 0215  ciprofloxacin (CIPRO) IVPB 400 mg     400 mg200 mL/hr over 60 Minutes Intravenous  Once 12/01/14 0207 12/01/14 0321      Assessment:: 80 yo M presents to [facility] on 12/25 with R upper quadrant pain x 4-5 days. In the ER, CT showed features concerning for acute cholecystitis.  PMH: DM, CKD Stage III, anemia, hx of CVA, hypothyroidism, seizures  Infectious Disease: Afebrile, WBC down to 11.9. Scr 2.66 improving  12/26 Flagyl >> 12/26 Cipro >>   Plan:  Cipro 400mg  IV q24h. Pharmacy will sign off. Dose ok for CrCl < 30. Will still adjust as Scr improves.  Trinity Hyland S. Alford Highland, PharmD, [place] Clinical Staff Pharmacist Pager [place], [place] 12/03/2014,1:08 PM

## 2014-12-03 NOTE — Progress Notes (Signed)
[facility] Progress Note     Subjective: Pt is still having significant pain.  No N/V, WBC up today.  Family at bedside asking questions about perc-drain.  Patient somewhat confused.    Objective: Vital signs in last 24 hours: Temp:  [98.4 F (36.9 C)-98.9 F (37.2 C)] 98.9 F (37.2 C) (12/28 0521) Pulse Rate:  [68-71] 68 (12/28 0521) Resp:  [12-18] 15 (12/28 0521) BP: (142-161)/(63-72) 142/63 mmHg (12/28 0521) SpO2:  [92 %-99 %] 97 % (12/28 0521) Last BM Date: 11/26/14  Intake/Output from previous day: 12/27 0701 - 12/28 0700 In: 175 [I.V.:75; IV Piggyback:100] Out: 1500 [Urine:1500] Intake/Output this shift: Total I/O In: 0  Out: 500 [Urine:500]  PE: Gen:  Alert, NAD, pleasant Card:  RRR, no M/G/R heard Pulm:  CTA, no W/R/R Abd: Soft, ND, tender to palpation over RUQ, palpable gallbladder, +BS, no HSM   Lab Results:   Recent Labs  12/02/14 1104 12/03/14 0801  WBC 11.5* 11.9*  HGB 8.6* 9.2*  HCT 26.6* 27.5*  PLT 227 244   BMET  Recent Labs  12/02/14 0425 12/03/14 0839  NA 132* 135  K 4.2 4.1  CL 104 108  CO2 23 20  GLUCOSE 126* 113*  BUN 37* 38*  CREATININE 2.74* 2.66*  CALCIUM 8.3* 8.3*   PT/INR No results for input(s): LABPROT, INR in the last 72 hours. CMP     Component Value Date/Time   NA 135 12/03/2014 0839   K 4.1 12/03/2014 0839   CL 108 12/03/2014 0839   CO2 20 12/03/2014 0839   GLUCOSE 113* 12/03/2014 0839   BUN 38* 12/03/2014 0839   CREATININE 2.66* 12/03/2014 0839   CREATININE 2.83* 08/09/2014 1057   CALCIUM 8.3* 12/03/2014 0839   PROT 5.9* 12/03/2014 0839   ALBUMIN 2.4* 12/03/2014 0839   AST 70* 12/03/2014 0839   ALT 38 12/03/2014 0839   ALKPHOS 222* 12/03/2014 0839   BILITOT 0.8 12/03/2014 0839   GFRNONAA 21* 12/03/2014 0839   GFRNONAA 25* 06/16/2013 0957   GFRAA 24* 12/03/2014 0839   GFRAA 29* 06/16/2013 0957   Lipase     Component Value Date/Time   LIPASE 19 12/03/2014 0839        Studies/Results: No results found.  Anti-infectives: Anti-infectives    Start     Dose/Rate Route Frequency Ordered Stop   12/02/14 0200  ciprofloxacin (CIPRO) IVPB 400 mg     400 mg200 mL/hr over 60 Minutes Intravenous Every 24 hours 12/01/14 1033     12/01/14 0400  metroNIDAZOLE (FLAGYL) IVPB 500 mg     500 mg100 mL/hr over 60 Minutes Intravenous Every 8 hours 12/01/14 0356     12/01/14 0230  metroNIDAZOLE (FLAGYL) IVPB 500 mg  Status:  Discontinued     500 mg100 mL/hr over 60 Minutes Intravenous  Once 12/01/14 0218 12/01/14 0401   12/01/14 0215  ciprofloxacin (CIPRO) IVPB 400 mg     400 mg200 mL/hr over 60 Minutes Intravenous  Once 12/01/14 0207 12/01/14 0321       Assessment/Plan Acute cholecystitis with significant pain and tenderness. Acute on chronic kidney disease Diabetes mellitus History of CVA  Complex partial seizure disorder  anemia of chronic disease Current anticoagulation on Plavix, last dose 2 days ago by history  Hypertension Elevated troponin, significance unknown  Plan: 1.  He has been symptomatic for 6 days or so and I suspect the gallbladder is significantly inflamed. CT scan corroborates this. 2.  Continue antibiotics. 3.  Npo, only  ice chips, needs bowel rest 4.  His gallbladder is likely very infected.  It is palpable.  We feel he is still at high risk for complications and risk for conversation to open procedure.  We discussed his risk with his family at bedside.  Plan for perc drain on Tuesday or more likely Wednesday to help temporize the infection.  Discussed drain study in 6-8 week, and potentially removing drain or performing surgery if he is healthy at that point.   5.  Mobilize and IS as able 6.  Recommend PT/OT consult    LOS: 3 days    [name], Chase Chase 12/03/2014, 11:22 AM Pager: [phone]

## 2014-12-04 LAB — GLUCOSE, CAPILLARY
Glucose-Capillary: 142 mg/dL — ABNORMAL HIGH (ref 70–99)
Glucose-Capillary: 119 mg/dL — ABNORMAL HIGH (ref 70–99)
Glucose-Capillary: 108 mg/dL — ABNORMAL HIGH (ref 70–99)
Glucose-Capillary: 138 mg/dL — ABNORMAL HIGH (ref 70–99)
Glucose-Capillary: 96 mg/dL (ref 70–99)

## 2014-12-04 LAB — CBC
HCT: 27.6 % — ABNORMAL LOW (ref 39.0–52.0)
Hemoglobin: 8.8 g/dL — ABNORMAL LOW (ref 13.0–17.0)
MCH: 29.4 pg (ref 26.0–34.0)
MCHC: 31.9 g/dL (ref 30.0–36.0)
MCV: 92.3 fL (ref 78.0–100.0)
Platelets: 256 10*3/uL (ref 150–400)
RBC: 2.99 MIL/uL — ABNORMAL LOW (ref 4.22–5.81)
RDW: 12.6 % (ref 11.5–15.5)
WBC: 10.1 10*3/uL (ref 4.0–10.5)

## 2014-12-04 LAB — BASIC METABOLIC PANEL
Anion gap: 10 (ref 5–15)
BUN: 40 mg/dL — ABNORMAL HIGH (ref 6–23)
CO2: 18 mmol/L — ABNORMAL LOW (ref 19–32)
Calcium: 8.2 mg/dL — ABNORMAL LOW (ref 8.4–10.5)
Chloride: 105 mEq/L (ref 96–112)
Creatinine, Ser: 2.68 mg/dL — ABNORMAL HIGH (ref 0.50–1.35)
GFR calc Af Amer: 24 mL/min — ABNORMAL LOW (ref >90)
GFR calc non Af Amer: 21 mL/min — ABNORMAL LOW (ref >90)
Glucose, Bld: 152 mg/dL — ABNORMAL HIGH (ref 70–99)
Potassium: 4.3 mmol/L (ref 3.5–5.1)
Sodium: 133 mmol/L — ABNORMAL LOW (ref 135–145)

## 2014-12-04 MED ORDER — WHITE PETROLATUM GEL
Status: AC
  Administered 2014-12-04: 0.2
  Filled 2014-12-04: qty 5

## 2014-12-04 MED ORDER — SODIUM CHLORIDE 0.9 % IV SOLN: INTRAVENOUS | Status: AC

## 2014-12-04 NOTE — Progress Notes (Signed)
Patient ID: Chase [name], male   DOB: 1931/11/02, 80 y.o.   MRN: [id number]   LOS: 4 days   Subjective: Feeling better, pain controlled.   Objective: Vital signs in last 24 hours: Temp:  [99.3 F (37.4 C)-99.9 F (37.7 C)] 99.3 F (37.4 C) (12/29 0509) Pulse Rate:  [66-69] 66 (12/29 0509) Resp:  [15-16] 15 (12/29 0509) BP: (145-155)/(53-61) 155/61 mmHg (12/29 0509) SpO2:  [96 %-98 %] 98 % (12/29 0509) Last BM Date: 12/03/14   Laboratory  CBC  Recent Labs  12/03/14 0801 12/04/14 0500  WBC 11.9* 10.1  HGB 9.2* 8.8*  HCT 27.5* 27.6*  PLT 244 256   BMET  Recent Labs  12/03/14 0839 12/04/14 0500  NA 135 133*  K 4.1 4.3  CL 108 105  CO2 20 18*  GLUCOSE 113* 152*  BUN 38* 40*  CREATININE 2.66* 2.68*  CALCIUM 8.3* 8.2*    Physical Exam General appearance: alert and no distress Resp: clear to auscultation bilaterally Cardio: regular rate and rhythm GI: Soft, +BS   Assessment/Plan: Acute cholecystitis with significant pain and tenderness. Acute on chronic kidney disease Diabetes mellitus History of CVA  Complex partial seizure disorder  anemia of chronic disease Current anticoagulation on Plavix, held now Hypertension Elevated troponin, significance unknown  Plan: 1. He has been symptomatic for 6 days or so and I suspect the gallbladder is significantly inflamed. CT scan corroborates this. 2. Continue antibiotics, WBC normalized 3. Npo, only ice chips, needs bowel rest 4. His gallbladder is likely very infected. It is palpable. We feel he is still at high risk for complications and risk for conversation to open procedure. We discussed his risk with his family at bedside. Plan for perc drain tomorrow to help temporize the infection. Discussed drain study in 6-8 week, and potentially removing drain or performing surgery if he is healthy at that point.  5. Mobilize and IS as able 6. Will order PT/OT consult   Lisette Abu,  PA-C Pager: [phone] 12/04/2014

## 2014-12-04 NOTE — Progress Notes (Signed)
Utilization review completed. Ayinde Swim, RN, BSN. 

## 2014-12-04 NOTE — Progress Notes (Signed)
Patient was cleared from cardiology perspective to proceed with surgery without further cardiovascular workup, however given his significant comorbidities, surgery was considered high risk to change to open cholecystectomy, eventually surgery has decided to changed to percutaneous draining tube placement. Cardiology will see patient tomorrow after tube placement, if stable from heart perspective, will sign off.   Hilbert Corrigan PA Pager: [phone]

## 2014-12-04 NOTE — Evaluation (Signed)
Physical Therapy Evaluation Patient Details Name: Chase [name] MRN: [id number] DOB: 1930-12-20 Today's Date: 12/04/2014   History of Present Illness  80 year old male patient with history of hyperlipidemia, PAD, seizures, CVA, diabetes mellitus with renal complications, stage IV chronic kidney disease (baseline creatinine 2.5), hypothyroid and hypertensive heart disease, presented initially to [facility] with right upper quadrant pain. He was diagnosed with acute cholecystitis and cholelithiasis. Pt family requested transfer to [facility]. Planned for perc drain placement 12/30.  Clinical Impression  Pt currently requiring modA for transfers and RW for safe ambulation. Pt was indep with cane and only requiring minimal assist for bathing dressing PTA. Spouse unable to provide more than minA. Pending how pt progresses s/p drain placement planned for tomorrow pt may need ST-SNF placement if pt can not achieve a safe min level of function. PT to re-assess when appropriate s/p procedure.    Follow Up Recommendations SNF;Supervision/Assistance - 24 hour    Equipment Recommendations  None recommended by PT    Recommendations for Other Services OT consult     Precautions / Restrictions Precautions Precautions: Fall Required Braces or Orthoses: Other Brace/Splint Other Brace/Splint: pt with L foot/lower leg bandage for ulcer Restrictions Weight Bearing Restrictions: No      Mobility  Bed Mobility Overal bed mobility:  (pt up in chair upon PT arrival)                Transfers Overall transfer level: Needs assistance Equipment used: Rolling walker (2 wheeled) Transfers: Sit to/from Stand Sit to Stand: Mod assist;+2 physical assistance         General transfer comment: modA to initiate transfer and anterior weight shift, once pt holding onto RW pt uable to achieve upright posture  Ambulation/Gait Ambulation/Gait assistance: Min assist;+2  safety/equipment Ambulation Distance (Feet): 50 Feet Assistive device: Rolling walker (2 wheeled) Gait Pattern/deviations: Decreased stride length;Shuffle;Trunk flexed Gait velocity: slow   General Gait Details: v/c's to stay in walker, 3 standing rest breaks, minimal bilat foot clearance. 2nd person for chair follow and IV pole  Stairs            Wheelchair Mobility    Modified Rankin (Stroke Patients Only)       Balance Overall balance assessment: Needs assistance         Standing balance support: Bilateral upper extremity supported Standing balance-Leahy Scale: Poor Standing balance comment: requires physical assist and UE support                             Pertinent Vitals/Pain Pain Assessment: No/denies pain    Home Living Family/patient expects to be discharged to:: Private residence Living Arrangements: Spouse/significant other Available Help at Discharge:  (wife home 24/7 however unable to provide physical assist) Type of Home: House Home Access: Stairs to enter Entrance Stairs-Rails: Right Entrance Stairs-Number of Steps: 2 stairs to get into kitchen Home Layout: One level Home Equipment: Walker - 2 wheels;Cane - single point      Prior Function Level of Independence: Needs assistance   Gait / Transfers Assistance Needed: uses straight cane until 1 week ago  ADL's / Homemaking Assistance Needed: wife assist with dressing/bathing, pt able to feed self        Hand Dominance   Dominant Hand: Right    Extremity/Trunk Assessment   Upper Extremity Assessment: RUE deficits/detail;LUE deficits/detail RUE Deficits / Details: minimal shoulder, elbow and wrist ROM, reports this to be  baseline since 1999. Pt with generalized weakness of 3-/5 t/o     LUE Deficits / Details: generalized weakness, shld flex active 100 deg   Lower Extremity Assessment: Generalized weakness      Cervical / Trunk Assessment: Kyphotic  Communication    Communication: No difficulties  Cognition Arousal/Alertness: Awake/alert Behavior During Therapy: WFL for tasks assessed/performed Overall Cognitive Status: Within Functional Limits for tasks assessed                      General Comments      Exercises        Assessment/Plan    PT Assessment Patient needs continued PT services  PT Diagnosis Difficulty walking   PT Problem List Decreased strength;Decreased range of motion;Decreased activity tolerance;Decreased balance;Decreased mobility;Decreased coordination  PT Treatment Interventions DME instruction;Gait training;Stair training;Functional mobility training;Therapeutic activities;Therapeutic exercise   PT Goals (Current goals can be found in the Care Plan section) Acute Rehab PT Goals Patient Stated Goal: no stated PT Goal Formulation: With patient Time For Goal Achievement: 12/11/14 Potential to Achieve Goals: Good    Frequency Min 3X/week   Barriers to discharge Decreased caregiver support spouse unable to provide physical assist    Co-evaluation               End of Session Equipment Utilized During Treatment: Gait belt Activity Tolerance: Patient tolerated treatment well Patient left: in bed;with call bell/phone within reach;with family/visitor present Nurse Communication: Mobility status         Time: 1445-1515 PT Time Calculation (min) (ACUTE ONLY): 30 min   Charges:   PT Evaluation $Initial PT Evaluation Tier I: 1 Procedure PT Treatments $Gait Training: 8-22 mins   PT G CodesKingsley Callander 12/04/2014, 4:17 PM   Kittie Plater, PT, DPT Pager #: [phone] Office #: [phone]

## 2014-12-04 NOTE — Care Management Note (Signed)
CARE MANAGEMENT NOTE 12/04/2014  Patient:  Chase [name], Chase [name]   Account Number:  [id number]  Date Initiated:  12/04/2014  Documentation initiated by:    Subjective/Objective Assessment:     Action/Plan:   Anticipated DC Date:     Anticipated DC Plan:           Choice offered to / List presented to:             Status of service:   Medicare Important Message given?   (If response is "NO", the following Medicare IM given date fields will be blank) Date Medicare IM given:   Medicare IM given by:   Date Additional Medicare IM given:  12/04/2014 Additional Medicare IM given by:  Norina Buzzard

## 2014-12-04 NOTE — Progress Notes (Signed)
PROGRESS NOTE    Chase [name] [id number] DOB: 1930/12/11 DOA: 11/30/2014 PCP: Tula Nakayama, MD  HPI/Brief narrative 80 year old male patient with history of hyperlipidemia, PAD, seizures, CVA, diabetes mellitus with renal complications, stage IV chronic kidney disease (baseline creatinine 2.5), hypothyroid and hypertensive heart disease, presented initially to [facility] with right upper quadrant pain. He was diagnosed with acute cholecystitis and cholelithiasis. His case was discussed with GI and surgery at [facility] and family requested transfer to [facility]. Patient had minimally elevated troponins and EKG without acute changes. 2-D echo showed normal LVEF without wall motion abnormalities. Aspirin and Plavix were held. Cardiology has seen and cleared for intervention. Surgery has determined that patient would be high risk to change to open cholecystectomy and hence recommend percutaneous cholecystostomy-planned for 12/30 by IR.   Assessment/Plan:  1. Acute cholecystitis: General surgery input appreciated. Treating with NPO, IV fluids, IV antibiotics and pain management. Aspirin and Plavix have been on hold since 11/30/14. Patient is at high risk for surgery due to advanced age, multiple significant comorbidities and current acute illness. Cardiology consultation appreciated-recommend no further cardiac workup. As per surgery, patient at high risk for surgical complications hence recommending percutaneous drain. Plan for percutaneous cholecystostomy by IR on 12/30. 2. Elevated troponin/demand ischemia: No reported chest pain. EKG without acute changes. 2-D echo with normal LV function and no wall motion abnormalities. Aspirin and Plavix held for intervention/procedure. Cardiology recommended no further workup prior to procedure. Elevated troponin likely secondary to demand ischemia from acute infection, acute on chronic kidney disease and significant anemia. 3. Acute on  stage III chronic kidney disease: Creatinine had improved but plateaued in the last 24 hours. Continue IV fluids and follow BMP. 4. 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 Continue NovoLog SSI. 5. History of seizures: Keppra switched to IV while patient nothing by mouth. 6. Hypothyroid: Continue IV Synthroid. 7. Chronic anemia: Stable. Follow CBCs and transfuse if hemoglobin less than 7 g per DL. 8. History of hyperlipidemia: Holding statins. 9. Leukocytosis: Secondary to problem #1. Resolved. 10. Essential hypertension: Mildly uncontrolled. Holding by mouth amlodipine and hydralazine. When necessary IV hydralazine. 11. History of bifascicular block 12. History of BPH: Continue Flomax.   Code Status: Full Family Communication: Discussed with patient's daughter and son-in-law at bedside Disposition Plan: SNF when medically stable.   Consultants:  General surgery  Cardiology  Procedures:  None  Antibiotics:  IV Cipro  IV metronidazole   Subjective: Abdominal pain controlled by pain medications. Passing flatus and had small BM. No chest pain or dyspnea.  Objective: Filed Vitals:   12/03/14 1359 12/03/14 2253 12/04/14 0509 12/04/14 1318  BP: 145/53 146/56 155/61 165/63  Pulse: 69 67 66 69  Temp: 99.3 F (37.4 C) 99.9 F (37.7 C) 99.3 F (37.4 C) 98.5 F (36.9 C)  TempSrc: Oral Oral Oral Oral  Resp: 16 15 15 20   Height:      Weight:      SpO2: 97% 96% 98% 98%    Intake/Output Summary (Last 24 hours) at 12/04/14 1706 Last data filed at 12/04/14 1351  Gross per 24 hour  Intake    150 ml  Output   2075 ml  Net  -1925 ml   Filed Weights   11/30/14 2100 12/01/14 0309 12/01/14 1842  Weight: 85.73 kg (189 lb) 83.144 kg (183 lb 4.8 oz) 88 kg (194 lb 0.1 oz)     Exam:  General exam: Pleasant elderly male  lying sitting comfortably on a reclining chair. Respiratory system: Clear. No increased work of  breathing. Cardiovascular system: S1 & S2 heard, RRR. No JVD, murmurs, gallops, clicks or pedal edema. Telemetry: Sinus rhythm with BBB morphology. Gastrointestinal system: Abdomen is nondistended, soft. Mild right upper quadrant tenderness without peritoneal signs. Normal bowel sounds heard. Central nervous system: Alert and oriented. No focal neurological deficits. Extremities: Symmetric 5 x 5 power.   Data Reviewed: Basic Metabolic Panel:  Recent Labs Lab 11/30/14 2307 12/01/14 0434 12/02/14 0425 12/03/14 0839 12/04/14 0500  NA 132* 134* 132* 135 133*  K 3.8 3.8 4.2 4.1 4.3  CL 105 105 104 108 105  CO2 21 21 23 20  18*  GLUCOSE 164* 141* 126* 113* 152*  BUN 53* 52* 37* 38* 40*  CREATININE 3.24* 3.04* 2.74* 2.66* 2.68*  CALCIUM 8.3* 8.4 8.3* 8.3* 8.2*   Liver Function Tests:  Recent Labs Lab 11/30/14 2307 12/01/14 0434 12/02/14 0425 12/03/14 0839  AST 27 26 28  70*  ALT 20 22 22  38  ALKPHOS 75 74 82 222*  BILITOT 0.4 0.6 0.6 0.8  PROT 6.6 6.5 6.1 5.9*  ALBUMIN 3.0* 2.9* 2.4* 2.4*    Recent Labs Lab 11/30/14 2307 12/03/14 0839  LIPASE 24 19   No results for input(s): AMMONIA in the last 168 hours. CBC:  Recent Labs Lab 11/30/14 2307 12/01/14 0434 12/02/14 1104 12/03/14 0801 12/04/14 0500  WBC 15.2* 14.8* 11.5* 11.9* 10.1  NEUTROABS 12.9* 12.2*  --   --   --   HGB 8.8* 9.2* 8.6* 9.2* 8.8*  HCT 26.8* 28.0* 26.6* 27.5* 27.6*  MCV 94.0 94.3 92.0 93.9 92.3  PLT 213 220 227 244 256   Cardiac Enzymes:  Recent Labs Lab 12/01/14 0956 12/01/14 1558 12/01/14 2311 12/02/14 0425 12/02/14 1104  TROPONINI 0.04* 0.07* 0.05* 0.05* 0.05*   BNP (last 3 results) No results for input(s): PROBNP in the last 8760 hours. CBG:  Recent Labs Lab 12/03/14 2331 12/04/14 0340 12/04/14 0747 12/04/14 1143 12/04/14 1610  GLUCAP 113* 142* 119* 108* 138*    Recent Results (from the past 240 hour(s))  Surgical pcr screen     Status: None   Collection Time:  12/02/14  7:34 PM  Result Value Ref Range Status   MRSA, PCR NEGATIVE NEGATIVE Final   Staphylococcus aureus NEGATIVE NEGATIVE Final    Comment:        The Xpert SA Assay (FDA approved for NASAL specimens in patients over 48 years of age), is one component of a comprehensive surveillance program.  Test performance has been validated by [employer] for patients greater than or equal to 56 year old. It is not intended to diagnose infection nor to guide or monitor treatment.       Additional labs: 1. 2-D echo 12/01/14: Study Conclusions  - Left ventricle: The cavity size was normal. Wall thickness was increased in a pattern of moderate LVH. Systolic function was normal. The estimated ejection fraction was in the range of 55% to 60%. Wall motion was normal; there were no regional wall motion abnormalities. Doppler parameters are consistent with abnormal left ventricular relaxation (grade 1 diastolic dysfunction). - Aortic valve: Valve area (VTI): 2.69 cm^2. Valve area (Vmax): 2.73 cm^2. Valve area (Vmean): 2.43 cm^2. - Mitral valve: There was mild regurgitation. - Pulmonary arteries: Systolic pressure was mildly increased. PA peak pressure: 41 mm Hg (S).      Studies: No results found.      Scheduled Meds: . ciprofloxacin  400 mg Intravenous Q24H  . insulin aspart  0-9 Units Subcutaneous Q4H  . levETIRAcetam  250 mg Intravenous QHS  . levothyroxine  87.5 mcg Intravenous QAC breakfast  . metronidazole  500 mg Intravenous Q8H  . tamsulosin  0.4 mg Oral Daily   Continuous Infusions: . sodium chloride 75 mL/hr at 12/04/14 1554    Active Problems:   Hyperlipidemia LDL goal <100   Hypertensive heart disease   Elevated troponin   Renal failure (ARF), acute on chronic   Diabetes mellitus type 2, controlled   Acute cholecystitis   RBBB (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 fascicular block)    Time spent: 30  minutes.    Vernell Leep, MD, FACP, FHM. [facility] Pager [phone]  If 7PM-7AM, please contact night-coverage [email or web address] Password [email or web address] 12/04/2014, 5:06 PM    LOS: 4 days

## 2014-12-04 NOTE — Progress Notes (Signed)
PROGRESS NOTE    Chase [name] [id number] DOB: 1931/07/30 DOA: 11/30/2014 PCP: Tula Nakayama, MD  HPI/Brief narrative 80 year old male patient with history of hyperlipidemia, PAD, seizures, CVA, diabetes mellitus with renal complications, stage IV chronic kidney disease (baseline creatinine 2.5), hypothyroid and hypertensive heart disease, presented initially to [facility] with right upper quadrant pain. He was diagnosed with acute cholecystitis and cholelithiasis. His case was discussed with GI and surgery at [facility] and family requested transfer to [facility]. Patient had minimally elevated troponins and EKG without acute changes. 2-D echo showed normal LVEF without wall motion abnormalities. Aspirin and Plavix were held. Surgery has consulted and planned intervention-laparoscopic cholecystectomy versus cholecystostomy placement on 12/28. Cardiology has seen and cleared for intervention.   Assessment/Plan:  1. Acute cholecystitis: General surgery input appreciated. Treating with NPO, IV fluids, IV antibiotics and pain management. Aspirin and Plavix have been on hold since 11/30/14. Patient is at high risk for surgery due to advanced age, multiple significant comorbidities and current acute illness. Cardiology consultation appreciated-recommend no further cardiac workup. As per surgery, patient at high risk for surgical complications hence recommending percutaneous drain. This M.D. and the surgical team has discussed at length with patient and extended family at bedside. 2. Elevated troponin/demand ischemia: No reported chest pain. EKG without acute changes. 2-D echo with normal LV function and no wall motion abnormalities. Aspirin and Plavix held for possible intervention/procedure. Cardiology input appreciated and recommended no further workup prior to procedure. Elevated troponin likely secondary to demand ischemia from acute infection, acute on chronic kidney disease and  significant anemia. 3. Acute on stage III chronic kidney disease: Improving with IV fluid hydration. Continue hydration and follow up BMP in a.m. creatinine continues to improve. 4. 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 Continue NovoLog SSI. 5. History of seizures: Keppra switched to IV while patient nothing by mouth. 6. Hypothyroid: Continue IV Synthroid. 7. Chronic anemia: Stable. Follow CBCs and transfuse if hemoglobin less than 7 g per DL. 8. History of hyperlipidemia: Holding statins. 9. Leukocytosis: Secondary to problem #1. Follow CBCs.  10. Essential hypertension: Mildly uncontrolled. Holding by mouth amlodipine and hydralazine. When necessary IV hydralazine. 11. History of bifascicular block 12. History of BPH: Continue Flomax.   Code Status: Full Family Communication: Discussed with patient spouse and daughter at bedside. Disposition Plan: Home when medically stable.   Consultants:  General surgery  Cardiology  Procedures:  None  Antibiotics:  IV Cipro  IV metronidazole   Subjective: Patient states that abdominal pain is controlled with medications. Passing flatus. No BM. Family have a lot of questions regarding procedure.  Objective: Filed Vitals:   12/03/14 1359 12/03/14 2253 12/04/14 0509 12/04/14 1318  BP: 145/53 146/56 155/61 165/63  Pulse: 69 67 66 69  Temp: 99.3 F (37.4 C) 99.9 F (37.7 C) 99.3 F (37.4 C) 98.5 F (36.9 C)  TempSrc: Oral Oral Oral Oral  Resp: 16 15 15 20   Height:      Weight:      SpO2: 97% 96% 98% 98%    Intake/Output Summary (Last 24 hours) at 12/04/14 1705 Last data filed at 12/04/14 1351  Gross per 24 hour  Intake    150 ml  Output   2075 ml  Net  -1925 ml   Filed Weights   11/30/14 2100 12/01/14 0309 12/01/14 1842  Weight: 85.73 kg (189 lb) 83.144 kg (183 lb 4.8 oz) 88 kg (194 lb 0.1 oz)  Exam:  General exam: Pleasant elderly male lying comfortably supine in  bed. Respiratory system: Clear. No increased work of breathing. Cardiovascular system: S1 & S2 heard, RRR. No JVD, murmurs, gallops, clicks or pedal edema. Telemetry: Sinus rhythm with BBB morphology. Gastrointestinal system: Abdomen is nondistended, soft. Mild right upper quadrant tenderness without peritoneal signs. Normal bowel sounds heard. Central nervous system: Alert and oriented. No focal neurological deficits. Extremities: Symmetric 5 x 5 power.   Data Reviewed: Basic Metabolic Panel:  Recent Labs Lab 11/30/14 2307 12/01/14 0434 12/02/14 0425 12/03/14 0839 12/04/14 0500  NA 132* 134* 132* 135 133*  K 3.8 3.8 4.2 4.1 4.3  CL 105 105 104 108 105  CO2 21 21 23 20  18*  GLUCOSE 164* 141* 126* 113* 152*  BUN 53* 52* 37* 38* 40*  CREATININE 3.24* 3.04* 2.74* 2.66* 2.68*  CALCIUM 8.3* 8.4 8.3* 8.3* 8.2*   Liver Function Tests:  Recent Labs Lab 11/30/14 2307 12/01/14 0434 12/02/14 0425 12/03/14 0839  AST 27 26 28  70*  ALT 20 22 22  38  ALKPHOS 75 74 82 222*  BILITOT 0.4 0.6 0.6 0.8  PROT 6.6 6.5 6.1 5.9*  ALBUMIN 3.0* 2.9* 2.4* 2.4*    Recent Labs Lab 11/30/14 2307 12/03/14 0839  LIPASE 24 19   No results for input(s): AMMONIA in the last 168 hours. CBC:  Recent Labs Lab 11/30/14 2307 12/01/14 0434 12/02/14 1104 12/03/14 0801 12/04/14 0500  WBC 15.2* 14.8* 11.5* 11.9* 10.1  NEUTROABS 12.9* 12.2*  --   --   --   HGB 8.8* 9.2* 8.6* 9.2* 8.8*  HCT 26.8* 28.0* 26.6* 27.5* 27.6*  MCV 94.0 94.3 92.0 93.9 92.3  PLT 213 220 227 244 256   Cardiac Enzymes:  Recent Labs Lab 12/01/14 0956 12/01/14 1558 12/01/14 2311 12/02/14 0425 12/02/14 1104  TROPONINI 0.04* 0.07* 0.05* 0.05* 0.05*   BNP (last 3 results) No results for input(s): PROBNP in the last 8760 hours. CBG:  Recent Labs Lab 12/03/14 2331 12/04/14 0340 12/04/14 0747 12/04/14 1143 12/04/14 1610  GLUCAP 113* 142* 119* 108* 138*    Recent Results (from the past 240 hour(s))  Surgical  pcr screen     Status: None   Collection Time: 12/02/14  7:34 PM  Result Value Ref Range Status   MRSA, PCR NEGATIVE NEGATIVE Final   Staphylococcus aureus NEGATIVE NEGATIVE Final    Comment:        The Xpert SA Assay (FDA approved for NASAL specimens in patients over 37 years of age), is one component of a comprehensive surveillance program.  Test performance has been validated by [employer] for patients greater than or equal to 31 year old. It is not intended to diagnose infection nor to guide or monitor treatment.       Additional labs: 1. 2-D echo 12/01/14: Study Conclusions  - Left ventricle: The cavity size was normal. Wall thickness was increased in a pattern of moderate LVH. Systolic function was normal. The estimated ejection fraction was in the range of 55% to 60%. Wall motion was normal; there were no regional wall motion abnormalities. Doppler parameters are consistent with abnormal left ventricular relaxation (grade 1 diastolic dysfunction). - Aortic valve: Valve area (VTI): 2.69 cm^2. Valve area (Vmax): 2.73 cm^2. Valve area (Vmean): 2.43 cm^2. - Mitral valve: There was mild regurgitation. - Pulmonary arteries: Systolic pressure was mildly increased. PA peak pressure: 41 mm Hg (S).      Studies: No results found.  Scheduled Meds: . ciprofloxacin  400 mg Intravenous Q24H  . insulin aspart  0-9 Units Subcutaneous Q4H  . levETIRAcetam  250 mg Intravenous QHS  . levothyroxine  87.5 mcg Intravenous QAC breakfast  . metronidazole  500 mg Intravenous Q8H  . tamsulosin  0.4 mg Oral Daily   Continuous Infusions: . sodium chloride 75 mL/hr at 12/04/14 1554    Active Problems:   Hyperlipidemia LDL goal <100   Hypertensive heart disease   Elevated troponin   Renal failure (ARF), acute on chronic   Diabetes mellitus type 2, controlled   Acute cholecystitis   RBBB (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 fascicular  block)    Time spent: 40 minutes.    Vernell Leep, MD, FACP, FHM. [facility] Pager [phone]  If 7PM-7AM, please contact night-coverage [email or web address] Password [email or web address] 12/04/2014, 5:05 PM    LOS: 4 days

## 2014-12-05 ENCOUNTER — Inpatient Hospital Stay (HOSPITAL_COMMUNITY): Payer: Medicare Other

## 2014-12-05 DIAGNOSIS — N184 Chronic kidney disease, stage 4 (severe): Secondary | ICD-10-CM

## 2014-12-05 DIAGNOSIS — K810 Acute cholecystitis: Principal | ICD-10-CM

## 2014-12-05 LAB — COMPREHENSIVE METABOLIC PANEL
ALT: 166 U/L — ABNORMAL HIGH (ref 0–53)
AST: 284 U/L — ABNORMAL HIGH (ref 0–37)
Albumin: 2.4 g/dL — ABNORMAL LOW (ref 3.5–5.2)
Alkaline Phosphatase: 454 U/L — ABNORMAL HIGH (ref 39–117)
Anion gap: 9 (ref 5–15)
BUN: 37 mg/dL — ABNORMAL HIGH (ref 6–23)
CO2: 17 mmol/L — ABNORMAL LOW (ref 19–32)
Calcium: 8.2 mg/dL — ABNORMAL LOW (ref 8.4–10.5)
Chloride: 110 mEq/L (ref 96–112)
Creatinine, Ser: 2.4 mg/dL — ABNORMAL HIGH (ref 0.50–1.35)
GFR calc Af Amer: 28 mL/min — ABNORMAL LOW (ref >90)
GFR calc non Af Amer: 24 mL/min — ABNORMAL LOW (ref >90)
Glucose, Bld: 96 mg/dL (ref 70–99)
Potassium: 4 mmol/L (ref 3.5–5.1)
Sodium: 136 mmol/L (ref 135–145)
Total Bilirubin: 0.9 mg/dL (ref 0.3–1.2)
Total Protein: 5.7 g/dL — ABNORMAL LOW (ref 6.0–8.3)

## 2014-12-05 LAB — PROTIME-INR
INR: 1.48 (ref 0.00–1.49)
Prothrombin Time: 18 seconds — ABNORMAL HIGH (ref 11.6–15.2)

## 2014-12-05 LAB — CBC
HCT: 27.4 % — ABNORMAL LOW (ref 39.0–52.0)
Hemoglobin: 9 g/dL — ABNORMAL LOW (ref 13.0–17.0)
MCH: 30.8 pg (ref 26.0–34.0)
MCHC: 32.8 g/dL (ref 30.0–36.0)
MCV: 93.8 fL (ref 78.0–100.0)
Platelets: 281 10*3/uL (ref 150–400)
RBC: 2.92 MIL/uL — ABNORMAL LOW (ref 4.22–5.81)
RDW: 12.6 % (ref 11.5–15.5)
WBC: 8.6 10*3/uL (ref 4.0–10.5)

## 2014-12-05 LAB — GLUCOSE, CAPILLARY
Glucose-Capillary: 106 mg/dL — ABNORMAL HIGH (ref 70–99)
Glucose-Capillary: 129 mg/dL — ABNORMAL HIGH (ref 70–99)
Glucose-Capillary: 197 mg/dL — ABNORMAL HIGH (ref 70–99)
Glucose-Capillary: 199 mg/dL — ABNORMAL HIGH (ref 70–99)
Glucose-Capillary: 84 mg/dL (ref 70–99)
Glucose-Capillary: 85 mg/dL (ref 70–99)

## 2014-12-05 LAB — APTT: aPTT: 39 seconds — ABNORMAL HIGH (ref 24–37)

## 2014-12-05 MED ORDER — SODIUM CHLORIDE 0.9 % IV SOLN
INTRAVENOUS | Status: AC | PRN
  Administered 2014-12-05: 10 mL/h via INTRAVENOUS

## 2014-12-05 MED ORDER — AMLODIPINE BESYLATE 10 MG PO TABS
10.0000 mg | ORAL_TABLET | Freq: Every day | ORAL | Status: DC
  Administered 2014-12-06: 10 mg via ORAL
  Filled 2014-12-05 (×2): qty 1

## 2014-12-05 MED ORDER — MIDAZOLAM HCL 2 MG/2ML IJ SOLN
INTRAMUSCULAR | Status: AC
  Filled 2014-12-05: qty 2

## 2014-12-05 MED ORDER — HYDRALAZINE HCL 50 MG PO TABS
50.0000 mg | ORAL_TABLET | Freq: Two times a day (BID) | ORAL | Status: DC
  Administered 2014-12-05 – 2014-12-06 (×2): 50 mg via ORAL
  Filled 2014-12-05 (×3): qty 1

## 2014-12-05 MED ORDER — MIDAZOLAM HCL 2 MG/2ML IJ SOLN
INTRAMUSCULAR | Status: AC | PRN
  Administered 2014-12-05: 0.5 mg via INTRAVENOUS

## 2014-12-05 MED ORDER — LEVOTHYROXINE SODIUM 175 MCG PO TABS
175.0000 ug | ORAL_TABLET | Freq: Every day | ORAL | Status: DC
  Administered 2014-12-06: 175 ug via ORAL
  Filled 2014-12-05 (×3): qty 1

## 2014-12-05 MED ORDER — LIDOCAINE HCL 1 % IJ SOLN
INTRAMUSCULAR | Status: AC
  Filled 2014-12-05: qty 20

## 2014-12-05 MED ORDER — OXYCODONE HCL 5 MG PO TABS: 5.0000 mg | ORAL_TABLET | ORAL | Status: DC | PRN

## 2014-12-05 MED ORDER — LEVETIRACETAM 250 MG PO TABS
250.0000 mg | ORAL_TABLET | Freq: Every day | ORAL | Status: DC
  Administered 2014-12-05: 250 mg via ORAL
  Filled 2014-12-05 (×2): qty 1

## 2014-12-05 MED ORDER — FENTANYL CITRATE 0.05 MG/ML IJ SOLN
INTRAMUSCULAR | Status: AC | PRN
  Administered 2014-12-05: 25 ug via INTRAVENOUS

## 2014-12-05 MED ORDER — FENTANYL CITRATE 0.05 MG/ML IJ SOLN
INTRAMUSCULAR | Status: AC
  Filled 2014-12-05: qty 2

## 2014-12-05 MED ORDER — ASPIRIN EC 81 MG PO TBEC
81.0000 mg | DELAYED_RELEASE_TABLET | Freq: Every day | ORAL | Status: DC
  Administered 2014-12-06: 81 mg via ORAL
  Filled 2014-12-05 (×2): qty 1

## 2014-12-05 MED ORDER — IOHEXOL 300 MG/ML  SOLN
50.0000 mL | Freq: Once | INTRAMUSCULAR | Status: AC | PRN
  Administered 2014-12-05: 15 mL via INTRAVENOUS

## 2014-12-05 NOTE — Progress Notes (Signed)
    Subjective:  Flat affect. No chest pain or SOB  Objective:  Vital Signs in the last 24 hours: Temp:  [98.4 F (36.9 C)-98.6 F (37 C)] 98.4 F (36.9 C) (12/30 1050) Pulse Rate:  [69-78] 76 (12/30 1050) Resp:  [14-21] 16 (12/30 1050) BP: (154-191)/(63-89) 156/72 mmHg (12/30 1050) SpO2:  [98 %-100 %] 99 % (12/30 1050)  Intake/Output from previous day:  Intake/Output Summary (Last 24 hours) at 12/05/14 1151 Last data filed at 12/05/14 0333  Gross per 24 hour  Intake 1048.33 ml  Output   1000 ml  Net  48.33 ml    Physical Exam: General appearance: alert, cooperative and no distress Lungs: clear to auscultation bilaterally Heart: regular rate and rhythm   Rate: 78  Rhythm: normal sinus rhythm  Lab Results:  Recent Labs  12/04/14 0500 12/05/14 0743  WBC 10.1 8.6  HGB 8.8* 9.0*  PLT 256 281    Recent Labs  12/04/14 0500 12/05/14 0743  NA 133* 136  K 4.3 4.0  CL 105 110  CO2 18* 17*  GLUCOSE 152* 96  BUN 40* 37*  CREATININE 2.68* 2.40*   No results for input(s): TROPONINI in the last 72 hours.  Invalid input(s): CK, MB  Recent Labs  12/05/14 0743  INR 1.48    Imaging: Imaging results have been reviewed   Assessment/Plan:  80 year old male from [place], followed by Dr Moshe Cipro, was brought in with acute cholecystitis and found to have an elevated Troponin-0.07, and an abnormal EKG. He had multiple co morbidities including, PVD, prior CVA, DM, CRI 4, and HTN, We were asked to see for cardiac clearance. Echo showed preserved LVF with grade 1 diastolic dysfunction. He apparently had a remote cath that showed normal coronaries. It was decided to proceed with a percutaneous drain as opposed to open cholecystectomy, which the pt had today.   Principal Problem:   Acute cholecystitis Active Problems:   Elevated troponin   Type 2 diabetes mellitus with peripheral neuropathy   CKD (chronic kidney disease) stage 4, GFR 15-29 ml/min   Hypertensive  heart disease   Hypothyroidism   Hyperlipidemia LDL goal <100   Peripheral vascular disease   History of stroke   RBBB (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 fascicular block)   PLAN: S/P percutaneous drain placement. Stable from cardiovascular standpoint. We will be available as needed.   Kerin Ransom PA-C Beeper [id number] 12/05/2014, 11:51 AM  Agree. No immediate cardiovascular complications. Please call us back if needed. Sanda Klein, MD, [facility] [phone] office [phone] pager

## 2014-12-05 NOTE — Progress Notes (Signed)
PROGRESS NOTE    Chase [name] [id number] DOB: 1931-05-07 DOA: 11/30/2014 PCP: Tula Nakayama, MD  HPI/Brief narrative 80 year old male patient with history of hyperlipidemia, PAD, seizures, CVA, diabetes mellitus with renal complications, stage IV chronic kidney disease (baseline creatinine 2.5), hypothyroid and hypertensive heart disease, presented initially to [facility] with right upper quadrant pain. He was diagnosed with acute cholecystitis and cholelithiasis. His case was discussed with GI and surgery at [facility] and family requested transfer to [facility]. Patient had minimally elevated troponins and EKG without acute changes. 2-D echo showed normal LVEF without wall motion abnormalities. Aspirin and Plavix were held. Cardiology has seen and cleared for intervention. Surgery has determined that patient would be high risk to change to open cholecystectomy and hence recommend percutaneous cholecystostomy-planned for 12/30 by IR.  Subjective: Denies pain.   Assessment/Plan: Acute cholecystitis: General surgery input appreciated. Initially treated with NPO, IV fluids, IV antibiotics and pain management. Aspirin and Plavix were held. Patient is at high risk for surgery due to advanced age, multiple significant comorbidities and current acute illness. Cardiology consultation appreciated-recommend no further cardiac workup. As per surgery, patient at high risk for surgical complications hence recommending percutaneous drain. Received percutaneous cholecystostomy by IR on 12/30.  Patient's abdominal pain immediately improved.  Now tolerating clears.  Will advance to full liquids.  Aspirin restarted 12/30.  Will monitor and restart plavix in the near term.  Elevated liver enzymes - due to #1, monitor post drain placement.   Elevated troponin/demand ischemia: No reported chest pain. EKG without acute changes. 2-D echo with normal LV function and no wall motion abnormalities.  Aspirin and Plavix held for intervention/procedure.  Aspirin restarted 12/30. Cardiology recommended no further workup prior to procedure. Elevated troponin likely secondary to demand ischemia from acute infection, acute on chronic kidney disease and significant anemia.  Acute on stage III chronic kidney disease:  Acute on chronic renal failure.  Down trending.  Continue to monitor off IVF.  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 Continue NovoLog SSI.  History of seizures: Continue keppra.  Hypothyroid: progress from IV to PO synthroid.  Chronic anemia: Stable. Follow CBCs and transfuse if hemoglobin less than 7 g per DL.  History of hyperlipidemia: Holding statins.  Leukocytosis: Secondary to problem #1. Resolved.  Essential hypertension: Mildly uncontrolled. Restarting by mouth amlodipine and hydralazine 12/30. When necessary IV hydralazine.  History of bifascicular block  History of BPH: Continue Flomax.   Code Status: Full Family Communication: Discussed with patient's daughter and wife at bedside. Disposition Plan: SNF when medically stable.   Consultants:  General surgery  Cardiology  Procedures:  None  Antibiotics:  IV Cipro  IV metronidazole      Objective: Filed Vitals:   12/05/14 0946 12/05/14 0951 12/05/14 1050 12/05/14 1340  BP: 169/89 180/73 156/72 154/66  Pulse: 73 78 76 71  Temp:   98.4 F (36.9 C) 98.2 F (36.8 C)  TempSrc:   Oral Oral  Resp: 14 16 16 16   Height:      Weight:      SpO2: 100% 100% 99% 100%    Intake/Output Summary (Last 24 hours) at 12/05/14 1606 Last data filed at 12/05/14 0333  Gross per 24 hour  Intake 1048.33 ml  Output    750 ml  Net 298.33 ml   Filed Weights   11/30/14 2100 12/01/14 0309 12/01/14 1842  Weight: 85.73 kg (189 lb) 83.144 kg (183 lb 4.8 oz) 88  kg (194 lb 0.1 oz)     Exam:  General exam: Pleasant elderly male lying comfortably in bed. Wife and dtr at  bedside. Respiratory system: Clear. No increased work of breathing. Cardiovascular system: S1 & S2 heard, RRR. No JVD, murmurs, gallops, clicks or pedal edema.  Gastrointestinal system: Abdomen is nondistended, soft, non-tender,  Normal bowel sounds heard. Perc drain in RUQ bandage c/d/i, approx 20 cc of brownish drainage in bag. Central nervous system: Alert and oriented. No focal neurological deficits. Extremities: 5/5 strength in each.  Left lower extremity wrapped in ace bandage, c/d/i   Data Reviewed: Basic Metabolic Panel:  Recent Labs Lab 12/01/14 0434 12/02/14 0425 12/03/14 0839 12/04/14 0500 12/05/14 0743  NA 134* 132* 135 133* 136  K 3.8 4.2 4.1 4.3 4.0  CL 105 104 108 105 110  CO2 21 23 20  18* 17*  GLUCOSE 141* 126* 113* 152* 96  BUN 52* 37* 38* 40* 37*  CREATININE 3.04* 2.74* 2.66* 2.68* 2.40*  CALCIUM 8.4 8.3* 8.3* 8.2* 8.2*   Liver Function Tests:  Recent Labs Lab 11/30/14 2307 12/01/14 0434 12/02/14 0425 12/03/14 0839 12/05/14 0743  AST 27 26 28  70* 284*  ALT 20 22 22  38 166*  ALKPHOS 75 74 82 222* 454*  BILITOT 0.4 0.6 0.6 0.8 0.9  PROT 6.6 6.5 6.1 5.9* 5.7*  ALBUMIN 3.0* 2.9* 2.4* 2.4* 2.4*    Recent Labs Lab 11/30/14 2307 12/03/14 0839  LIPASE 24 19   CBC:  Recent Labs Lab 11/30/14 2307 12/01/14 0434 12/02/14 1104 12/03/14 0801 12/04/14 0500 12/05/14 0743  WBC 15.2* 14.8* 11.5* 11.9* 10.1 8.6  NEUTROABS 12.9* 12.2*  --   --   --   --   HGB 8.8* 9.2* 8.6* 9.2* 8.8* 9.0*  HCT 26.8* 28.0* 26.6* 27.5* 27.6* 27.4*  MCV 94.0 94.3 92.0 93.9 92.3 93.8  PLT 213 220 227 244 256 281   Cardiac Enzymes:  Recent Labs Lab 12/01/14 0956 12/01/14 1558 12/01/14 2311 12/02/14 0425 12/02/14 1104  TROPONINI 0.04* 0.07* 0.05* 0.05* 0.05*   CBG:  Recent Labs Lab 12/04/14 1952 12/05/14 0019 12/05/14 0336 12/05/14 0744 12/05/14 1235  GLUCAP 96 84 106* 85 129*    Recent Results (from the past 240 hour(s))  Surgical pcr screen      Status: None   Collection Time: 12/02/14  7:34 PM  Result Value Ref Range Status   MRSA, PCR NEGATIVE NEGATIVE Final   Staphylococcus aureus NEGATIVE NEGATIVE Final    Comment:        The Xpert SA Assay (FDA approved for NASAL specimens in patients over 15 years of age), is one component of a comprehensive surveillance program.  Test performance has been validated by [employer] for patients greater than or equal to 7 year old. It is not intended to diagnose infection nor to guide or monitor treatment.    Additional labs: 1. 2-D echo 12/01/14: Study Conclusions - Left ventricle: The cavity size was normal. Wall thickness was increased in a pattern of moderate LVH. Systolic function was normal. The estimated ejection fraction was in the range of 55%to 60%. Wall motion was normal; there were no regional wallmotion abnormalities. Doppler parameters are consistent withabnormal left ventricular relaxation (grade 1 diastolicdysfunction). Aortic valve: Valve area (VTI): 2.69 cm^2. Valve area (Vmax): 2.73 cm^2. Valve area (Vmean): 2.43 cm^2. Mitral valve: There was mild regurgitation. Pulmonary arteries: Systolic pressure was mildly increased. PApeak pressure: 41 mm Hg (S).  Studies: Ir Perc Cholecystostomy  12/05/2014  CLINICAL DATA:  Cholelithiasis and acute cholecystitis. The patient is not a candidate for cholecystectomy and requires placement of a percutaneous cholecystostomy tube.  EXAM: PERCUTANEOUS CHOLECYSTOSTOMY  COMPARISON:  CT of the abdomen on 12/01/2014  ANESTHESIA/SEDATION: 0.5 mg IV Versed; 25 mcg IV Fentanyl.  Total Moderate Sedation Time  10 minutes.  CONTRAST:  20mL OMNIPAQUE IOHEXOL 300 MG/ML  SOLN  MEDICATIONS: The patient had receive scheduled doses of IV Cipro and Flagyl in the morning. Additional prophylactic antibiotic was not administered.  FLUOROSCOPY TIME:  60 seconds.  PROCEDURE: The procedure, risks, benefits, and alternatives were explained to the patient.  Questions regarding the procedure were encouraged and answered. The patient understands and consents to the procedure.  The right abdominal wall was prepped with Betadine in a sterile fashion, and a sterile drape was applied covering the operative field. A sterile gown and sterile gloves were used for the procedure. Local anesthesia was provided with 1% Lidocaine. A time-out procedure was performed.  Ultrasound was utilized to localize the gallbladder. Under direct ultrasound guidance, a 21 gauge needle was advanced via a transhepatic approach into the gallbladder lumen. Aspiration was performed and a bile sample sent for culture studies. A small amount of diluted contrast material was injected. A guide wire was then advanced into the gallbladder. A transitional dilator was placed.  Percutaneous tract dilatation was then performed over a guide wire to 10-French. A 10-French pigtail drainage catheter was then advanced into the gallbladder lumen under fluoroscopy. Catheter was formed and injected with contrast material to confirm position. The catheter was flushed and connected to a gravity drainage bag. It was secured at the skin with a Prolene retention suture and Stat-Lock device.  COMPLICATIONS: None  FINDINGS: After needle puncture of the gallbladder, a bile sample was aspirated and sent for culture. Aspirated bile was turbid, bloody and purulent in appearance. The cholecystostomy tube was advanced into the gallbladder lumen and formed. It is now draining bile. This tube will be left to gravity drainage.  IMPRESSION: Percutaneous cholecystostomy with placement of 10-French drainage catheter into the gallbladder lumen. This was left to gravity drainage.   Electronically Signed   By: Aletta Edouard M.D.   On: 12/05/2014 13:43   Scheduled Meds: . amLODipine  10 mg Oral Daily  . aspirin EC  81 mg Oral Daily  . ciprofloxacin  400 mg Intravenous Q24H  . hydrALAZINE  50 mg Oral BID  . insulin aspart  0-9 Units  Subcutaneous Q4H  . levETIRAcetam  250 mg Oral QHS  . levothyroxine  175 mcg Oral QAC breakfast  . metronidazole  500 mg Intravenous Q8H  . 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  I have directly reviewed the clinical findings, lab, imaging studies and management of this patient in detail. I have made the  necessary changes to the above noted documentation, and agree with the documentation, as recorded by the Advance Practice Provider.    Marzetta Board, MD [facility] [phone]

## 2014-12-05 NOTE — Procedures (Signed)
Procedure:  Percutaneous cholecystostomy Findings:  Turbid/purulent appearing bile in GB.  10 Fr catheter placed and attached to gravity drainage bag.  Bile sample sent for cultures.

## 2014-12-05 NOTE — Clinical Social Work Placement (Signed)
Clinical Social Work Department CLINICAL SOCIAL WORK PLACEMENT NOTE 12/05/2014  Patient:  Chase [name], Chase [name]  Account Number:  [id number] Admit date:  11/30/2014  Clinical Social Worker:  Lovey Newcomer  Date/time:  12/05/2014 02:24 PM  Clinical Social Work is seeking post-discharge placement for this patient at the following level of care:   SKILLED NURSING   (*CSW will update this form in Epic as items are completed)   12/05/2014  Patient/family provided with [place] Department of Clinical Social Work's list of facilities offering this level of care within the geographic area requested by the patient (or if unable, by the patient's family).  12/05/2014  Patient/family informed of their freedom to choose among providers that offer the needed level of care, that participate in Medicare, Medicaid or managed care program needed by the patient, have an available bed and are willing to accept the patient.  12/05/2014  Patient/family informed of [facility]' ownership interest in [facility], as well as of the fact that they are under no obligation to receive care at this facility.  PASARR submitted to EDS on 12/05/2014 PASARR number received on 12/05/2014  FL2 transmitted to all facilities in geographic area requested by pt/family on  12/05/2014 FL2 transmitted to all facilities within larger geographic area on   Patient informed that his/her managed care company has contracts with or will negotiate with  certain facilities, including the following:     Patient/family informed of bed offers received:   Patient chooses bed at  Physician recommends and patient chooses bed at    Patient to be transferred to  on   Patient to be transferred to facility by  Patient and family notified of transfer on  Name of family member notified:    The following physician request were entered in Epic:   Additional Comments:    Liz Beach MSW, [place], [place],  [id number]

## 2014-12-05 NOTE — Progress Notes (Signed)
[facility] Progress Note     Subjective: Pt got his perc drain earlier this am, its having sanguinous purulent bilious drainage currently.  He denies much pain.  No N/V.  Anxious to have something to drink.  Family at bedside.    Objective: Vital signs in last 24 hours: Temp:  [98.5 F (36.9 C)-98.6 F (37 C)] 98.5 F (36.9 C) (12/30 0507) Pulse Rate:  [69-77] 73 (12/30 0946) Resp:  [14-21] 14 (12/30 0946) BP: (154-191)/(63-89) 169/89 mmHg (12/30 0946) SpO2:  [98 %-100 %] 100 % (12/30 0946) Last BM Date: 12/03/14  Intake/Output from previous day: 12/29 0701 - 12/30 0700 In: 1048.3 [I.V.:1048.3] Out: 2050 [Urine:2050] Intake/Output this shift:    PE: Gen:  Alert, NAD, pleasant Abd: Soft, minimal tenderness over drain site, ND, +BS, no HSM, GB not palpable today, right perc drain in place with sanguinous purulent bilious drainage in bag  Lab Results:   Recent Labs  12/04/14 0500 12/05/14 0743  WBC 10.1 8.6  HGB 8.8* 9.0*  HCT 27.6* 27.4*  PLT 256 281   BMET  Recent Labs  12/04/14 0500 12/05/14 0743  NA 133* 136  K 4.3 4.0  CL 105 110  CO2 18* 17*  GLUCOSE 152* 96  BUN 40* 37*  CREATININE 2.68* 2.40*  CALCIUM 8.2* 8.2*   PT/INR  Recent Labs  12/05/14 0743  LABPROT 18.0*  INR 1.48   CMP     Component Value Date/Time   NA 136 12/05/2014 0743   K 4.0 12/05/2014 0743   CL 110 12/05/2014 0743   CO2 17* 12/05/2014 0743   GLUCOSE 96 12/05/2014 0743   BUN 37* 12/05/2014 0743   CREATININE 2.40* 12/05/2014 0743   CREATININE 2.83* 08/09/2014 1057   CALCIUM 8.2* 12/05/2014 0743   PROT 5.7* 12/05/2014 0743   ALBUMIN 2.4* 12/05/2014 0743   AST 284* 12/05/2014 0743   ALT 166* 12/05/2014 0743   ALKPHOS 454* 12/05/2014 0743   BILITOT 0.9 12/05/2014 0743   GFRNONAA 24* 12/05/2014 0743   GFRNONAA 25* 06/16/2013 0957   GFRAA 28* 12/05/2014 0743   GFRAA 29* 06/16/2013 0957   Lipase     Component Value Date/Time   LIPASE 19 12/03/2014  0839       Studies/Results: No results found.  Anti-infectives: Anti-infectives    Start     Dose/Rate Route Frequency Ordered Stop   12/02/14 0200  ciprofloxacin (CIPRO) IVPB 400 mg     400 mg200 mL/hr over 60 Minutes Intravenous Every 24 hours 12/01/14 1033     12/01/14 0400  metroNIDAZOLE (FLAGYL) IVPB 500 mg     500 mg100 mL/hr over 60 Minutes Intravenous Every 8 hours 12/01/14 0356     12/01/14 0230  metroNIDAZOLE (FLAGYL) IVPB 500 mg  Status:  Discontinued     500 mg100 mL/hr over 60 Minutes Intravenous  Once 12/01/14 0218 12/01/14 0401   12/01/14 0215  ciprofloxacin (CIPRO) IVPB 400 mg     400 mg200 mL/hr over 60 Minutes Intravenous  Once 12/01/14 0207 12/01/14 0321       Assessment/Plan Acute cholecystitis with significant pain and tenderness. Acute on chronic kidney disease Diabetes mellitus History of CVA  Complex partial seizure disorder  anemia of chronic disease Current anticoagulation on Plavix, held now Hypertension Elevated troponin, significance unknown  Plan: 1. He has been symptomatic for 6 days or so and I suspect the gallbladder is significantly inflamed. CT scan corroborates this. 2. Continue antibiotics, WBC normalized 3. Npo, only  ice chips, needs bowel rest 4. His gallbladder is likely very infected. It is palpable. We feel he is still at high risk for complications and risk for conversation to open procedure. Risks discussed with his family. Perc drain completed this am to help temporize the infection. Discussed drain study in 6-8 week, and potentially removing drain or performing surgery if he is healthy at that point.  5. Mobilize and IS as able 6. PT says SNF/24hr assist    LOS: 5 days    Coralie Keens 12/05/2014, 9:47 AM Pager: [phone]

## 2014-12-05 NOTE — Progress Notes (Signed)
Physical Therapy Treatment Patient Details Name: Chase [name] MRN: [id number] DOB: Apr 08, 1931 Today's Date: 12/05/2014    History of Present Illness 80 year old male patient with history of hyperlipidemia, PAD, seizures, CVA, diabetes mellitus with renal complications, stage IV chronic kidney disease (baseline creatinine 2.5), hypothyroid and hypertensive heart disease, presented initially to [facility] with right upper quadrant pain. He was diagnosed with acute cholecystitis and cholelithiasis. Pt family requested transfer to [facility]. Planned for perc drain placement 12/30.    PT Comments    Progressing slowly.  Less pain post chole drain placement, but stiff, deconditioned and unable to isolate movements well to transition in bed, sit to stand or scoot to EOB or chair.  Follow Up Recommendations  SNF;Supervision/Assistance - 24 hour     Equipment Recommendations  None recommended by PT    Recommendations for Other Services       Precautions / Restrictions Precautions Precautions: Fall Other Brace/Splint: pt with L foot/lower leg bandage for ulcer    Mobility  Bed Mobility Overal bed mobility: Needs Assistance Bed Mobility: Sit to Supine Rolling: Mod assist     Sit to supine: Mod assist   General bed mobility comments: Due to stroke, pt unable to do isolated movement to scoot or pivot.  Needed trunk control and a little LE assist  Transfers Overall transfer level: Needs assistance Equipment used: Rolling walker (2 wheeled) Transfers: Sit to/from Stand Sit to Stand: Mod assist;+2 safety/equipment         General transfer comment: pulls up on RW instead of pushing due to no use of R UE.  Discussed need to change this.  Ambulation/Gait Ambulation/Gait assistance: Min assist;+2 safety/equipment Ambulation Distance (Feet): 35 Feet Assistive device: Rolling walker (2 wheeled) Gait Pattern/deviations: Step-to pattern;Step-through  pattern;Shuffle;Wide base of support Gait velocity: slow   General Gait Details: Pt needed frequent cues to stay up in the RW. Also neede frequent rest breaks to regroup.   Stairs            Wheelchair Mobility    Modified Rankin (Stroke Patients Only)       Balance Overall balance assessment: Needs assistance Sitting-balance support: No upper extremity supported;Feet supported Sitting balance-Leahy Scale: Fair Sitting balance - Comments: unable to accept challenge at EOB and unable to pull forward to midline in chair without L UE and assist   Standing balance support: Bilateral upper extremity supported Standing balance-Leahy Scale: Poor Standing balance comment: Requires RW and guard, but stood  for minutes for pericare and cleaning up the pee from urinary incontinence                    Cognition Arousal/Alertness: Awake/alert Behavior During Therapy: WFL for tasks assessed/performed Overall Cognitive Status: Within Functional Limits for tasks assessed                      Exercises Other Exercises Other Exercises: 10 reps each for hip/knee gross flexion/extension exercise with resistance bothe flexion and ext.    General Comments        Pertinent Vitals/Pain Pain Assessment: Faces Faces Pain Scale: Hurts little more Pain Location: R flank tube site Pain Descriptors / Indicators: Grimacing Pain Intervention(s): Patient requesting pain meds-RN notified;Limited activity within patient's tolerance    Home Living                      Prior Function  PT Goals (current goals can now be found in the care plan section) Acute Rehab PT Goals Patient Stated Goal: none stated PT Goal Formulation: With patient Time For Goal Achievement: 12/11/14 Potential to Achieve Goals: Good Progress towards PT goals: Progressing toward goals    Frequency  Min 3X/week    PT Plan Current plan remains appropriate    Co-evaluation              End of Session   Activity Tolerance: Patient tolerated treatment well Patient left: in bed;with call bell/phone within reach;with family/visitor present     Time: [id number] PT Time Calculation (min) (ACUTE ONLY): 34 min  Charges:  $Gait Training: 8-22 mins $Therapeutic Activity: 8-22 mins                    G Codes:      Tramain Gershman, Tessie Fass 12/05/2014, 5:22 PM 12/05/2014  Donnella Sham, PT [phone]  (pager)

## 2014-12-05 NOTE — Clinical Social Work Psychosocial (Signed)
Clinical Social Work Department BRIEF PSYCHOSOCIAL ASSESSMENT 12/05/2014  Patient:  Chase [name], Chase [name]     Account Number:  [id number]     Admit date:  11/30/2014  Clinical Social Worker:  Lovey Newcomer  Date/Time:  12/05/2014 02:18 PM  Referred by:  Physician  Date Referred:  12/05/2014 Referred for  SNF Placement   Other Referral:   NA   Interview type:  Patient Other interview type:   Patient and family interviewed at bedside to complete assessment.    PSYCHOSOCIAL DATA Living Status:  WIFE Admitted from facility:   Level of care:   Primary support name:  Sallie Primary support relationship to patient:  SPOUSE Degree of support available:   Support is strong.    CURRENT CONCERNS Current Concerns  Post-Acute Placement   Other Concerns:   NA    SOCIAL WORK ASSESSMENT / PLAN CSW met with  patient and family at bedside to complete assessment. Several family members including children and grandchilren present. Patient and family agree with recommendation for SNF placement for short term rehab once medically stable. CSW explained search/placement and process and answered questions. Family states they would prefer [facility] in [place]. Family appears calm, supportive, and engaged in assessment. Patient plans to return home with wife after SNF.   Assessment/plan status:  Psychosocial Support/Ongoing Assessment of Needs Other assessment/ plan:   Complete Fl2, Fax, PASRR   Information/referral to community resources:   CSW contact information and SNF list given.    PATIENT'S/FAMILY'S RESPONSE TO PLAN OF CARE: Patient and family plan for a DC to SNF when medically stable, if this is necessary. CSW will assist as appropriate.    Liz Beach MSW, [place], [place], [phone]

## 2014-12-05 NOTE — Sedation Documentation (Signed)
Patient denies pain and is resting comfortably.  

## 2014-12-05 NOTE — Evaluation (Signed)
Occupational Therapy Evaluation Patient Details Name: Chase [name] MRN: [id number] DOB: Apr 30, 1931 Today's Date: 12/05/2014    History of Present Illness 80 year old male patient with history of hyperlipidemia, PAD, seizures, CVA, diabetes mellitus with renal complications, stage IV chronic kidney disease (baseline creatinine 2.5), hypothyroid and hypertensive heart disease, presented initially to [facility] with right upper quadrant pain. He was diagnosed with acute cholecystitis and cholelithiasis. Pt family requested transfer to [facility]. Planned for perc drain placement 12/30.   Clinical Impression   Patient admitted with above. Patient required up to min assist PTA. Patient currently functioning at an up to total +2 assist at this time. Please see OT problem list below. Feel patient will benefit from acute OT to increase overall independence in the areas of ADLs & functional mobility and in order to safely discharge to venue listed below. Discussed recommended SNF placement and patient & family agreed.     Follow Up Recommendations  SNF;Supervision/Assistance - 24 hour    Equipment Recommendations   (defer to next venue)    Recommendations for Other Services  None at this time     Precautions / Restrictions Precautions Precautions: Fall Required Braces or Orthoses: Other Brace/Splint Other Brace/Splint: pt with L foot/lower leg bandage for ulcer Restrictions Weight Bearing Restrictions: No      Mobility Bed Mobility Overal bed mobility: Needs Assistance Bed Mobility: Rolling;Supine to Sit Rolling: Mod assist   Supine to sit: Mod assist        Transfers Overall transfer level: Needs assistance Equipment used: Rolling walker (2 wheeled) Transfers: Sit to/from Stand Sit to Stand: Mod assist;+2 safety/equipment    Balance Overall balance assessment: Needs assistance Sitting-balance support: Bilateral upper extremity supported Sitting  balance-Leahy Scale: Poor     Standing balance support: Bilateral upper extremity supported Standing balance-Leahy Scale: Poor     ADL Overall ADL's : Needs assistance/impaired     Grooming: Moderate assistance;Sitting   Upper Body Bathing: Moderate assistance;Sitting   Lower Body Bathing: Total assistance;Sit to/from stand   Upper Body Dressing : Moderate assistance;Sitting   Lower Body Dressing: Total assistance;Sit to/from stand   Toilet Transfer: Moderate assistance;BSC;RW   Toileting- Clothing Manipulation and Hygiene: Total assistance;Sit to/from stand;+2 for safety/equipment         General ADL Comments: Patient with difficulty engaging in bed mobility, requiring up to mod assist for rolling and in order to go from supine>sit. Patient with difficulty scooting hips forward to get feet flat on floor,  requring mod assist for this. Patient able to stand with mod assist. Patient requires up to total assist for ADL components. According to wife, patient at a min assist level PTA.    Vision  Patient reports no change from baseline   Perception Perception Perception Tested?: No   Praxis Praxis Praxis tested?: Within functional limits    Pertinent Vitals/Pain Pain Assessment: No/denies pain     Hand Dominance Right   Extremity/Trunk Assessment Upper Extremity Assessment Upper Extremity Assessment: RUE deficits/detail;LUE deficits/detail RUE Deficits / Details: minimal shoulder, elbow and wrist ROM, reports this to be baseline since 1999. Pt with generalized weakness of 3-/5 t/o LUE Deficits / Details: generalized weakness, shld flex active 100 deg   Lower Extremity Assessment Lower Extremity Assessment: Defer to PT evaluation   Cervical / Trunk Assessment Cervical / Trunk Assessment: Kyphotic   Communication Communication Communication: No difficulties   Cognition Arousal/Alertness: Awake/alert Behavior During Therapy: WFL for tasks  assessed/performed Overall Cognitive Status: Within Functional  Limits for tasks assessed             Home Living Family/patient expects to be discharged to:: Private residence Living Arrangements: Spouse/significant other Available Help at Discharge:  (wife home 24/7, however unable to provide needed assistance) Type of Home: House Home Access: Stairs to enter [employer] of Steps: 2 stairs to get into kitchen Entrance Stairs-Rails: Right Home Layout: One level (small threshold into/out of bedroom)     Bathroom Shower/Tub: Tub/shower unit;Curtain         Home Equipment: Environmental consultant - 2 wheels;Cane - single point   Additional Comments: Patient reports he only takes sponge baths secondary to ulcer on left foot. Patient reports it's been ~1 year since he's showered      Prior Functioning/Environment Level of Independence: Needs assistance  Gait / Transfers Assistance Needed: uses straight cane and RW prn ADL's / Homemaking Assistance Needed: wife assist with dressing/bathing, pt able to feed self        OT Diagnosis: Generalized weakness   OT Problem List: Decreased strength;Decreased range of motion;Decreased activity tolerance;Impaired balance (sitting and/or standing);Decreased coordination;Decreased safety awareness;Decreased knowledge of use of DME or AE;Impaired UE functional use;Pain   OT Treatment/Interventions: Self-care/ADL training;Therapeutic exercise;Energy conservation;DME and/or AE instruction;Therapeutic activities;Patient/family education;Balance training    OT Goals(Current goals can be found in the care plan section) Acute Rehab OT Goals Patient Stated Goal: none stated OT Goal Formulation: With patient/family Time For Goal Achievement: 12/19/14 Potential to Achieve Goals: Good ADL Goals Pt Will Perform Grooming: standing;with supervision Pt Will Perform Upper Body Bathing: sitting;with set-up Pt Will Perform Lower Body Bathing: sit to/from  stand;with min assist;with adaptive equipment Pt Will Perform Upper Body Dressing: sitting;with supervision Pt Will Perform Lower Body Dressing: with min assist;sit to/from stand;with adaptive equipment Pt Will Transfer to Toilet: with supervision;ambulating;bedside commode Pt/caregiver will Perform Home Exercise Program: Increased ROM;Increased strength;Both right and left upper extremity;With written HEP provided  OT Frequency: Min 2X/week   Barriers to D/C: Decreased caregiver support  Patient's wife can only provide minimal assistance once patient discharged > home          End of Session Equipment Utilized During Treatment: Gait belt;Rolling walker Nurse Communication: Mobility status  Activity Tolerance: Patient tolerated treatment well Patient left: in chair;with call bell/phone within reach;with family/visitor present;with nursing/sitter in room   Time: 1145-1235 OT Time Calculation (min): 50 min Charges:  OT General Charges $OT Visit: 1 Procedure OT Evaluation $Initial OT Evaluation Tier I: 1 Procedure OT Treatments $Self Care/Home Management : 23-37 mins Roddrick Sharron , MS, OTR/L, CLT  12/05/2014, 1:29 PM

## 2014-12-06 DIAGNOSIS — K838 Other specified diseases of biliary tract: Secondary | ICD-10-CM | POA: Diagnosis not present

## 2014-12-06 DIAGNOSIS — E1165 Type 2 diabetes mellitus with hyperglycemia: Secondary | ICD-10-CM | POA: Diagnosis not present

## 2014-12-06 DIAGNOSIS — N184 Chronic kidney disease, stage 4 (severe): Secondary | ICD-10-CM | POA: Diagnosis not present

## 2014-12-06 DIAGNOSIS — R2689 Other abnormalities of gait and mobility: Secondary | ICD-10-CM | POA: Diagnosis not present

## 2014-12-06 DIAGNOSIS — Z7982 Long term (current) use of aspirin: Secondary | ICD-10-CM | POA: Diagnosis not present

## 2014-12-06 DIAGNOSIS — T85518A Breakdown (mechanical) of other gastrointestinal prosthetic devices, implants and grafts, initial encounter: Secondary | ICD-10-CM | POA: Diagnosis not present

## 2014-12-06 DIAGNOSIS — E1140 Type 2 diabetes mellitus with diabetic neuropathy, unspecified: Secondary | ICD-10-CM | POA: Diagnosis not present

## 2014-12-06 DIAGNOSIS — R278 Other lack of coordination: Secondary | ICD-10-CM | POA: Diagnosis not present

## 2014-12-06 DIAGNOSIS — I129 Hypertensive chronic kidney disease with stage 1 through stage 4 chronic kidney disease, or unspecified chronic kidney disease: Secondary | ICD-10-CM | POA: Diagnosis not present

## 2014-12-06 DIAGNOSIS — E871 Hypo-osmolality and hyponatremia: Secondary | ICD-10-CM | POA: Diagnosis not present

## 2014-12-06 DIAGNOSIS — E038 Other specified hypothyroidism: Secondary | ICD-10-CM | POA: Diagnosis not present

## 2014-12-06 DIAGNOSIS — K829 Disease of gallbladder, unspecified: Secondary | ICD-10-CM | POA: Diagnosis not present

## 2014-12-06 DIAGNOSIS — L97321 Non-pressure chronic ulcer of left ankle limited to breakdown of skin: Secondary | ICD-10-CM | POA: Diagnosis not present

## 2014-12-06 DIAGNOSIS — M6281 Muscle weakness (generalized): Secondary | ICD-10-CM | POA: Diagnosis not present

## 2014-12-06 DIAGNOSIS — K810 Acute cholecystitis: Secondary | ICD-10-CM | POA: Diagnosis not present

## 2014-12-06 DIAGNOSIS — E6609 Other obesity due to excess calories: Secondary | ICD-10-CM | POA: Diagnosis not present

## 2014-12-06 DIAGNOSIS — E782 Mixed hyperlipidemia: Secondary | ICD-10-CM | POA: Diagnosis not present

## 2014-12-06 DIAGNOSIS — E875 Hyperkalemia: Secondary | ICD-10-CM | POA: Diagnosis not present

## 2014-12-06 DIAGNOSIS — R809 Proteinuria, unspecified: Secondary | ICD-10-CM | POA: Diagnosis not present

## 2014-12-06 DIAGNOSIS — D638 Anemia in other chronic diseases classified elsewhere: Secondary | ICD-10-CM | POA: Diagnosis not present

## 2014-12-06 DIAGNOSIS — I1310 Hypertensive heart and chronic kidney disease without heart failure, with stage 1 through stage 4 chronic kidney disease, or unspecified chronic kidney disease: Secondary | ICD-10-CM | POA: Diagnosis not present

## 2014-12-06 DIAGNOSIS — Z79899 Other long term (current) drug therapy: Secondary | ICD-10-CM | POA: Diagnosis not present

## 2014-12-06 DIAGNOSIS — E039 Hypothyroidism, unspecified: Secondary | ICD-10-CM | POA: Diagnosis not present

## 2014-12-06 DIAGNOSIS — Z8673 Personal history of transient ischemic attack (TIA), and cerebral infarction without residual deficits: Secondary | ICD-10-CM | POA: Diagnosis not present

## 2014-12-06 DIAGNOSIS — Z794 Long term (current) use of insulin: Secondary | ICD-10-CM | POA: Diagnosis not present

## 2014-12-06 DIAGNOSIS — D649 Anemia, unspecified: Secondary | ICD-10-CM | POA: Diagnosis not present

## 2014-12-06 DIAGNOSIS — I83023 Varicose veins of left lower extremity with ulcer of ankle: Secondary | ICD-10-CM | POA: Diagnosis not present

## 2014-12-06 DIAGNOSIS — R7989 Other specified abnormal findings of blood chemistry: Secondary | ICD-10-CM | POA: Diagnosis not present

## 2014-12-06 DIAGNOSIS — Z880 Allergy status to penicillin: Secondary | ICD-10-CM | POA: Diagnosis not present

## 2014-12-06 DIAGNOSIS — I6789 Other cerebrovascular disease: Secondary | ICD-10-CM | POA: Diagnosis not present

## 2014-12-06 DIAGNOSIS — K819 Cholecystitis, unspecified: Secondary | ICD-10-CM | POA: Diagnosis present

## 2014-12-06 DIAGNOSIS — E669 Obesity, unspecified: Secondary | ICD-10-CM | POA: Diagnosis not present

## 2014-12-06 DIAGNOSIS — L97329 Non-pressure chronic ulcer of left ankle with unspecified severity: Secondary | ICD-10-CM | POA: Diagnosis not present

## 2014-12-06 DIAGNOSIS — R293 Abnormal posture: Secondary | ICD-10-CM | POA: Diagnosis not present

## 2014-12-06 DIAGNOSIS — E785 Hyperlipidemia, unspecified: Secondary | ICD-10-CM | POA: Diagnosis not present

## 2014-12-06 DIAGNOSIS — L97829 Non-pressure chronic ulcer of other part of left lower leg with unspecified severity: Secondary | ICD-10-CM | POA: Diagnosis not present

## 2014-12-06 DIAGNOSIS — Z4889 Encounter for other specified surgical aftercare: Secondary | ICD-10-CM | POA: Diagnosis present

## 2014-12-06 DIAGNOSIS — N189 Chronic kidney disease, unspecified: Secondary | ICD-10-CM | POA: Diagnosis not present

## 2014-12-06 DIAGNOSIS — I739 Peripheral vascular disease, unspecified: Secondary | ICD-10-CM | POA: Diagnosis not present

## 2014-12-06 DIAGNOSIS — R569 Unspecified convulsions: Secondary | ICD-10-CM | POA: Diagnosis not present

## 2014-12-06 DIAGNOSIS — Z886 Allergy status to analgesic agent status: Secondary | ICD-10-CM | POA: Diagnosis not present

## 2014-12-06 DIAGNOSIS — E119 Type 2 diabetes mellitus without complications: Secondary | ICD-10-CM | POA: Diagnosis not present

## 2014-12-06 DIAGNOSIS — L97322 Non-pressure chronic ulcer of left ankle with fat layer exposed: Secondary | ICD-10-CM | POA: Diagnosis not present

## 2014-12-06 DIAGNOSIS — R41841 Cognitive communication deficit: Secondary | ICD-10-CM | POA: Diagnosis not present

## 2014-12-06 DIAGNOSIS — I10 Essential (primary) hypertension: Secondary | ICD-10-CM | POA: Diagnosis not present

## 2014-12-06 DIAGNOSIS — I872 Venous insufficiency (chronic) (peripheral): Secondary | ICD-10-CM | POA: Diagnosis not present

## 2014-12-06 DIAGNOSIS — N183 Chronic kidney disease, stage 3 (moderate): Secondary | ICD-10-CM | POA: Diagnosis not present

## 2014-12-06 DIAGNOSIS — F329 Major depressive disorder, single episode, unspecified: Secondary | ICD-10-CM | POA: Diagnosis not present

## 2014-12-06 LAB — CBC
HCT: 27.8 % — ABNORMAL LOW (ref 39.0–52.0)
Hemoglobin: 9 g/dL — ABNORMAL LOW (ref 13.0–17.0)
MCH: 29.6 pg (ref 26.0–34.0)
MCHC: 32.4 g/dL (ref 30.0–36.0)
MCV: 91.4 fL (ref 78.0–100.0)
Platelets: 317 10*3/uL (ref 150–400)
RBC: 3.04 MIL/uL — ABNORMAL LOW (ref 4.22–5.81)
RDW: 12.7 % (ref 11.5–15.5)
WBC: 6.7 10*3/uL (ref 4.0–10.5)

## 2014-12-06 LAB — HEPATIC FUNCTION PANEL
ALT: 122 U/L — ABNORMAL HIGH (ref 0–53)
AST: 103 U/L — ABNORMAL HIGH (ref 0–37)
Albumin: 2.4 g/dL — ABNORMAL LOW (ref 3.5–5.2)
Alkaline Phosphatase: 361 U/L — ABNORMAL HIGH (ref 39–117)
Bilirubin, Direct: 0.2 mg/dL (ref 0.0–0.3)
Indirect Bilirubin: 0.3 mg/dL (ref 0.3–0.9)
Total Bilirubin: 0.5 mg/dL (ref 0.3–1.2)
Total Protein: 5.5 g/dL — ABNORMAL LOW (ref 6.0–8.3)

## 2014-12-06 LAB — COMPREHENSIVE METABOLIC PANEL
ALT: 121 U/L — ABNORMAL HIGH (ref 0–53)
AST: 105 U/L — ABNORMAL HIGH (ref 0–37)
Albumin: 2.4 g/dL — ABNORMAL LOW (ref 3.5–5.2)
Alkaline Phosphatase: 377 U/L — ABNORMAL HIGH (ref 39–117)
Anion gap: 6 (ref 5–15)
BUN: 31 mg/dL — ABNORMAL HIGH (ref 6–23)
CO2: 20 mmol/L (ref 19–32)
Calcium: 8.4 mg/dL (ref 8.4–10.5)
Chloride: 108 mEq/L (ref 96–112)
Creatinine, Ser: 2.28 mg/dL — ABNORMAL HIGH (ref 0.50–1.35)
GFR calc Af Amer: 29 mL/min — ABNORMAL LOW (ref >90)
GFR calc non Af Amer: 25 mL/min — ABNORMAL LOW (ref >90)
Glucose, Bld: 133 mg/dL — ABNORMAL HIGH (ref 70–99)
Potassium: 4.3 mmol/L (ref 3.5–5.1)
Sodium: 134 mmol/L — ABNORMAL LOW (ref 135–145)
Total Bilirubin: 0.6 mg/dL (ref 0.3–1.2)
Total Protein: 5.5 g/dL — ABNORMAL LOW (ref 6.0–8.3)

## 2014-12-06 LAB — GLUCOSE, CAPILLARY
Glucose-Capillary: 113 mg/dL — ABNORMAL HIGH (ref 70–99)
Glucose-Capillary: 130 mg/dL — ABNORMAL HIGH (ref 70–99)
Glucose-Capillary: 133 mg/dL — ABNORMAL HIGH (ref 70–99)
Glucose-Capillary: 253 mg/dL — ABNORMAL HIGH (ref 70–99)

## 2014-12-06 MED ORDER — METRONIDAZOLE 500 MG PO TABS
500.0000 mg | ORAL_TABLET | Freq: Three times a day (TID) | ORAL | Status: DC
  Filled 2014-12-06 (×3): qty 1

## 2014-12-06 MED ORDER — CIPROFLOXACIN HCL 500 MG PO TABS
500.0000 mg | ORAL_TABLET | ORAL | Status: DC
  Filled 2014-12-06: qty 1

## 2014-12-06 MED ORDER — CIPROFLOXACIN HCL 500 MG PO TABS: 500.0000 mg | ORAL_TABLET | ORAL | Status: DC

## 2014-12-06 MED ORDER — METRONIDAZOLE 500 MG PO TABS: 500.0000 mg | ORAL_TABLET | Freq: Three times a day (TID) | ORAL | Status: DC

## 2014-12-06 MED ORDER — OXYCODONE HCL 5 MG PO TABS: 5.0000 mg | ORAL_TABLET | ORAL | Status: DC | PRN

## 2014-12-06 NOTE — Progress Notes (Signed)
Pt prepared for d/c to SNF. IV d/c'd. Skin intact except as most recently charted. Vitals are stable. Report called to receiving facility. Pt to be transported by ambulance service. 

## 2014-12-06 NOTE — Discharge Instructions (Signed)
Cholecystostomy  °The gallbladder is a pear-shaped organ that lies beneath the liver on the right side of the body. The gallbladder stores bile, a fluid that helps the body digest fats. However, sometimes bile and other fluids build up in the gallbladder because of an obstruction (for example, a gallstones). This can cause fever, pain, swelling, nausea and other serious symptoms. °The procedure used to drain these fluids is called a cholecystostomy. A tube is inserted into the gallbladder. Fluid drains through the tube into a plastic bag outside the body. This procedure is usually done on people who are admitted to the hospital. °The procedure is often recommended for people who cannot have gallbladder surgery right away, usually because they are too ill to make it through surgery. The cholecystostomy tube is usually temporary, until surgery can be done. °RISKS AND COMPLICATIONS °Although rare, complications can include: °· Clogging of the tube. °· Infection in or around the drain site. Antibiotics might be prescribed for the infection. Or, another tube might be inserted to drain the infected fluid. °· Internal bleeding from the liver. °BEFORE THE PROCEDURE  °· Try to quit smoking several weeks before the procedure. Smoking can slow healing. °· Arrange for someone to drive you home from the hospital. °· Right before your procedure, avoid all foods and liquids after midnight. This includes coffee, tea and water. °· On the day of the procedure, arrive early to fill out all the paperwork. °PROCEDURE °You will be given a sedative to make you sleepy and a local anesthetic to numb the skin. Next, a small cut is made in the abdomen. Then a tube is threaded through the cut into the gallbladder. The procedure is usually done with ultrasound to guide the tube into the gallbladder. Once the tube is in place, the drain is secured to the skin with a stitch. The tube is then connected to a drainage bag.  °AFTER THE PROCEDURE   °· People who have a cholecystostomy usually stay in the hospital for several days because they are so ill. You might not be able to eat for the first few days. Instead, you will be connected to an IV for fluids and nutrients. °· The procedure does not cure the blockage that caused the fluid to build up in the first place. Because of this, the gallbladder will need to be removed in the future. The drain is removed at that time. °HOME CARE INSTRUCTIONS  °Be sure to follow your healthcare provider's instructions carefully. You may shower but avoid tub baths and swimming until your caregiver says it is OK. Eat and drink according to the directions you have been given. And be sure to make all follow-up appointments.  °Call your healthcare provider if you notice new pain, redness or swelling around the wound. °SEEK IMMEDIATE MEDICAL CARE IF:  °· There is increased abdominal pain. °· Nausea or vomiting occurs. °· You develop a fever. °· The drainage tube comes out of the abdomen. °Document Released: 02/19/2009 Document Revised: 02/15/2012 Document Reviewed: 02/19/2009 °ExitCare® Patient Information ©2015 [employer]. This information is not intended to replace advice given to you by your health care provider. Make sure you discuss any questions you have with your health care provider. ° °

## 2014-12-06 NOTE — Progress Notes (Signed)
[facility] Progress Note     Subjective: Feels good, no pain or N/V tolerating clears, was advanced to [facility] diet at 1:41am.  Family at bedside says he's much better.  Perc drain drained 12mL/24hr.      Objective: Vital signs in last 24 hours: Temp:  [97.9 F (36.6 C)-98.4 F (36.9 C)] 98.1 F (36.7 C) (12/31 0555) Pulse Rate:  [67-78] 73 (12/31 0555) Resp:  [1-21] 1 (12/31 0555) BP: (141-191)/(66-89) 141/68 mmHg (12/31 0555) SpO2:  [99 %-100 %] 100 % (12/31 0555) Last BM Date: 12/03/14  Intake/Output from previous day: 12/30 0701 - 12/31 0700 In: 340 [P.O.:240; IV Piggyback:100] Out: 980 [Urine:900; Drains:80] Intake/Output this shift:    PE: Gen:  Alert, NAD, pleasant Abd: Soft, quite tender of perc drain site otherwise less pain, ND, +BS, no HSM,  drain with sanguinous purulent bilious drainage   Lab Results:   Recent Labs  12/05/14 0743 12/06/14 0536  WBC 8.6 6.7  HGB 9.0* 9.0*  HCT 27.4* 27.8*  PLT 281 317   BMET  Recent Labs  12/04/14 0500 12/05/14 0743  NA 133* 136  K 4.3 4.0  CL 105 110  CO2 18* 17*  GLUCOSE 152* 96  BUN 40* 37*  CREATININE 2.68* 2.40*  CALCIUM 8.2* 8.2*   PT/INR  Recent Labs  12/05/14 0743  LABPROT 18.0*  INR 1.48   CMP     Component Value Date/Time   NA 136 12/05/2014 0743   K 4.0 12/05/2014 0743   CL 110 12/05/2014 0743   CO2 17* 12/05/2014 0743   GLUCOSE 96 12/05/2014 0743   BUN 37* 12/05/2014 0743   CREATININE 2.40* 12/05/2014 0743   CREATININE 2.83* 08/09/2014 1057   CALCIUM 8.2* 12/05/2014 0743   PROT 5.7* 12/05/2014 0743   ALBUMIN 2.4* 12/05/2014 0743   AST 284* 12/05/2014 0743   ALT 166* 12/05/2014 0743   ALKPHOS 454* 12/05/2014 0743   BILITOT 0.9 12/05/2014 0743   GFRNONAA 24* 12/05/2014 0743   GFRNONAA 25* 06/16/2013 0957   GFRAA 28* 12/05/2014 0743   GFRAA 29* 06/16/2013 0957   Lipase     Component Value Date/Time   LIPASE 19 12/03/2014 0839       Studies/Results: Ir Perc  Cholecystostomy  12/05/2014   CLINICAL DATA:  Cholelithiasis and acute cholecystitis. The patient is not a candidate for cholecystectomy and requires placement of a percutaneous cholecystostomy tube.  EXAM: PERCUTANEOUS CHOLECYSTOSTOMY  COMPARISON:  CT of the abdomen on 12/01/2014  ANESTHESIA/SEDATION: 0.5 mg IV Versed; 25 mcg IV Fentanyl.  Total Moderate Sedation Time  10 minutes.  CONTRAST:  37mL OMNIPAQUE IOHEXOL 300 MG/ML  SOLN  MEDICATIONS: The patient had receive scheduled doses of IV Cipro and Flagyl in the morning. Additional prophylactic antibiotic was not administered.  FLUOROSCOPY TIME:  60 seconds.  PROCEDURE: The procedure, risks, benefits, and alternatives were explained to the patient. Questions regarding the procedure were encouraged and answered. The patient understands and consents to the procedure.  The right abdominal wall was prepped with Betadine in a sterile fashion, and a sterile drape was applied covering the operative field. A sterile gown and sterile gloves were used for the procedure. Local anesthesia was provided with 1% Lidocaine. A time-out procedure was performed.  Ultrasound was utilized to localize the gallbladder. Under direct ultrasound guidance, a 21 gauge needle was advanced via a transhepatic approach into the gallbladder lumen. Aspiration was performed and a bile sample sent for culture studies. A small amount of  diluted contrast material was injected. A guide wire was then advanced into the gallbladder. A transitional dilator was placed.  Percutaneous tract dilatation was then performed over a guide wire to 10-French. A 10-French pigtail drainage catheter was then advanced into the gallbladder lumen under fluoroscopy. Catheter was formed and injected with contrast material to confirm position. The catheter was flushed and connected to a gravity drainage bag. It was secured at the skin with a Prolene retention suture and Stat-Lock device.  COMPLICATIONS: None  FINDINGS:  After needle puncture of the gallbladder, a bile sample was aspirated and sent for culture. Aspirated bile was turbid, bloody and purulent in appearance. The cholecystostomy tube was advanced into the gallbladder lumen and formed. It is now draining bile. This tube will be left to gravity drainage.  IMPRESSION: Percutaneous cholecystostomy with placement of 10-French drainage catheter into the gallbladder lumen. This was left to gravity drainage.   Electronically Signed   By: Aletta Edouard M.D.   On: 12/05/2014 13:43    Anti-infectives: Anti-infectives    Start     Dose/Rate Route Frequency Ordered Stop   12/02/14 0200  ciprofloxacin (CIPRO) IVPB 400 mg     400 mg200 mL/hr over 60 Minutes Intravenous Every 24 hours 12/01/14 1033     12/01/14 0400  metroNIDAZOLE (FLAGYL) IVPB 500 mg     500 mg100 mL/hr over 60 Minutes Intravenous Every 8 hours 12/01/14 0356     12/01/14 0230  metroNIDAZOLE (FLAGYL) IVPB 500 mg  Status:  Discontinued     500 mg100 mL/hr over 60 Minutes Intravenous  Once 12/01/14 0218 12/01/14 0401   12/01/14 0215  ciprofloxacin (CIPRO) IVPB 400 mg     400 mg200 mL/hr over 60 Minutes Intravenous  Once 12/01/14 0207 12/01/14 0321       Assessment/Plan Acute cholecystitis with significant pain and tenderness. Acute on chronic kidney disease Diabetes mellitus History of CVA  Complex partial seizure disorder  anemia of chronic disease Current anticoagulation on Plavix, held now Hypertension Elevated troponin, significance unknown  Plan: 1. Continue antibiotics, WBC normalized 2. On [place] diet 3. Perc drain completed yesterday to help temporize the infection.Drain study in 6-8 week, and potentially removing drain or performing surgery if he is healthy at that point. Would have IR schedule with their drain clinic in 6-8 weeks for re-imaging 4. Mobilize and IS as able 5. LFT's were not drawn, will repeat them.  If normalized can likely go to SNF, if not would keep  one more day. 6. Drain putting out 32mL/24hr 7.  Will need f/u appt with Dr. Grandville Silos in 2-3 weeks 8.  Would finish a 7-10 day course of antibiotics 9.  Will need HH for drain management and teaching     LOS: 6 days    DORT, [place] 12/06/2014, 7:50 AM Pager: [phone]

## 2014-12-06 NOTE — Care Management Note (Signed)
    Page 1 of 1   12/06/2014     11:45:40 AM CARE MANAGEMENT NOTE 12/06/2014  Patient:  Chase [name], Chase [name]   Account Number:  [id number]  Date Initiated:  12/06/2014  Documentation initiated by:  GRAVES-BIGELOW,Earline Stiner  Subjective/Objective Assessment:   Pt admitted for Acute cholecystitis. S/p percutaneous drain.     Action/Plan:   CM and CSW spoke with pt and family. Plan once stable will be planned for d/c to SNF. CSW to assist with disposition needs.   Anticipated DC Date:  12/06/2014   Anticipated DC Plan:  SKILLED NURSING FACILITY  In-house referral  Clinical Social Worker      DC Planning Services  CM consult      Choice offered to / List presented to:             Status of service:  Completed, signed off Medicare Important Message given?  YES (If response is "NO", the following Medicare IM given date fields will be blank) Date Medicare IM given:  12/06/2014 Medicare IM given by:  GRAVES-BIGELOW,Khari Mally Date Additional Medicare IM given:  12/04/2014 Additional Medicare IM given by:  Norina Buzzard  Discharge Disposition:  [facility]  Per UR Regulation:  Reviewed for med. necessity/level of care/duration of stay  If discussed at [place] of Stay Meetings, dates discussed:    Comments:

## 2014-12-06 NOTE — Clinical Social Work Placement (Signed)
Clinical Social Work Department CLINICAL SOCIAL WORK PLACEMENT NOTE 12/06/2014  Patient:  Chase [name], Chase [name]  Account Number:  [id number] [place] date:  11/30/2014  Clinical Social Worker:  Lovey Newcomer  Date/time:  12/05/2014 02:24 PM  Clinical Social Work is seeking post-discharge placement for this patient at the following level of care:   SKILLED NURSING   (*CSW will update this form in Epic as items are completed)   12/05/2014  Patient/family provided with [place] Department of Clinical Social Work's list of facilities offering this level of care within the geographic area requested by the patient (or if unable, by the patient's family).  12/05/2014  Patient/family informed of their freedom to choose among providers that offer the needed level of care, that participate in Medicare, Medicaid or managed care program needed by the patient, have an available bed and are willing to accept the patient.  12/05/2014  Patient/family informed of [facility]' ownership interest in [facility], as well as of the fact that they are under no obligation to receive care at this facility.  PASARR submitted to EDS on 12/05/2014 PASARR number received on 12/05/2014  FL2 transmitted to all facilities in geographic area requested by pt/family on  12/05/2014 FL2 transmitted to all facilities within larger geographic area on   Patient informed that his/her managed care company has contracts with or will negotiate with  certain facilities, including the following:     Patient/family informed of bed offers received:  12/06/2014 Patient chooses bed at [facility] Physician recommends and patient chooses bed at    Patient to be transferred to [facility] on  12/06/2014 Patient to be transferred to facility by Ambulance Patient and family notified of transfer on 12/06/2014 Name of family member notified:  Freda Munro and [name] at bedside  The following  physician request were entered in Epic:   Additional Comments: Per MD patient ready for DC to [facility]. RN, patient, patient's family, and facility notified of DC. RN given number for report. DC packet on chart. [facility] transport requested for patient for 3:00PM (Service request ID: [id number]). CSW signing off.    Liz Beach MSW, [place], [place], [id number]   Bryant Raoul Ciano MSW, [place], [place], [id number]

## 2014-12-06 NOTE — Discharge Summary (Signed)
Physician Discharge Summary  Chase [name] [id number] DOB: 1930-12-27 DOA: 11/30/2014  PCP: Tula Nakayama, MD  Admit date: 11/30/2014 Discharge date: 12/06/2014  Time spent: 45 minutes  Recommendations for Outpatient Follow-up:  1. Continue Ciprofloxacin and Metronidazole for 9 additional days to complete a 10 day course from the time of drain placement 2. Follow up with PCP in 1-2 weeks 3. Follow up with Dr. Grandville Silos from General Surgery in 2-3 weeks 4. Please continue physical therapy 5. Please repeat CBC/CMP in 2-3 days   6. Drain care as per RN protocol  Discharge Diagnoses:  Principal Problem:   Acute cholecystitis Active Problems:   Hypothyroidism   Hyperlipidemia LDL goal <100   Peripheral vascular disease   Type 2 diabetes mellitus with peripheral neuropathy   History of stroke   CKD (chronic kidney disease) stage 4, GFR 15-29 ml/min   Hypertensive heart disease   Elevated troponin   RBBB (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 fascicular block)  Discharge Condition: stable  Diet recommendation: heart healthy  Filed Weights   11/30/14 2100 12/01/14 0309 12/01/14 1842  Weight: 85.73 kg (189 lb) 83.144 kg (183 lb 4.8 oz) 88 kg (194 lb 0.1 oz)   History of present illness:  Chase [name] is a 80 y.o. male with history of diabetes mellitus type 2, chronic kidney disease stage III, chronic anemia, history of CVA, hypothyroidism, history of partial seizures presents to the ER because of right upper quadrant pain. Patient has been having right upper quadrant pain over the last 4-5 days. Initially 5 days ago patient also had an episode of vomiting. Patient has been having poor appetite and has not moved his bowels for last 4-5 days. In the ER CT abdomen and pelvis done shows features concerning for acute cholecystitis and on-call surgeon has been consulted patient has been admitted for further management of his possible acute cholecystitis. Patient's troponin  is found to be mildly elevated but patient denies any chest pain or shortness of breath and EKG is unremarkable at this time. Patient has not had any previous history of CAD.   Hospital Course:  Acute cholecystitis: General surgery input appreciated. Initially treated with NPO, IV fluids, IV antibiotics and pain management. Aspirin and Plavix were held. Patient is at high risk for surgery due to advanced age, multiple significant comorbidities and current acute illness. Cardiology consultation appreciated-recommend no further cardiac workup. As per surgery, patient at high risk for surgical complications hence recommending percutaneous drain. Received percutaneous cholecystostomy by IR on 12/30. Patient's abdominal pain immediately improved.He tolerating diet well and was advanced without any difficulties, any further abdominal pain nausea or vomiting.  Elevated liver enzymes - due to #1, improving post drain placement. Would recommend repeat LFTs in 2-3 days to ensure continued improvement.  Elevated troponin/demand ischemia: No reported chest pain. EKG without acute changes. 2-D echo with normal LV function and no wall motion abnormalities. Aspirin and Plavix held for intervention/procedure.Cardiology recommended no further workup prior to procedure. Elevated troponin likely secondary to demand ischemia from acute infection, acute on chronic kidney disease and significant anemia. Acute on stage III chronic kidney disease: Acute on chronic renal failure. Down trending. stable, repeat in 2-3 days Uncontrolled type II DM with renal complications: continue home medications History of seizures: Continue keppra. Hypothyroid: progress from IV to PO synthroid. Chronic anemia: Stable. Follow CBCs and transfuse if hemoglobin less than 7 g per DL. History of hyperlipidemia: Holding statins. Leukocytosis: Secondary to problem #1. Resolved.  Essential hypertension: Mildly uncontrolled. Restarting by mouth  amlodipine and hydralazine 12/30. When necessary IV hydralazine. History of bifascicular block History of BPH: Continue Flomax.  Procedures:  Percutaneous cholecystectomy  Consultations:  IR  General Surgery  Cardiology   Discharge Exam: Filed Vitals:   12/05/14 1340 12/05/14 1653 12/05/14 2100 12/06/14 0555  BP: 154/66 160/81 181/81 141/68  Pulse: 71  67 73  Temp: 98.2 F (36.8 C)  97.9 F (36.6 C) 98.1 F (36.7 C)  TempSrc: Oral  Oral Oral  Resp: 16  18 1   Height:      Weight:      SpO2: 100%  100% 100%    General: NAD Cardiovascular: RRR Respiratory: CTA biL Abdomen: mild tenderness at the perc chole site  Discharge Instructions    Medication List    TAKE these medications        amLODipine 10 MG tablet  Commonly known as:  NORVASC  TAKE ONE TABLET BY MOUTH ONCE DAILY     aspirin EC 81 MG tablet  Take 1 tablet (81 mg total) by mouth daily.     chlorthalidone 50 MG tablet  Commonly known as:  HYGROTON  Take 1 tablet (50 mg total) by mouth daily.     ciprofloxacin 500 MG tablet  Commonly known as:  CIPRO  Take 1 tablet (500 mg total) by mouth daily.     clopidogrel 75 MG tablet  Commonly known as:  PLAVIX  TAKE ONE TABLET BY MOUTH IN THE MORNING WITH BREAKFAST     EYE VITAMINS PO  Take 1 tablet by mouth daily.     hydrALAZINE 50 MG tablet  Commonly known as:  APRESOLINE  Take 1 tablet (50 mg total) by mouth 2 (two) times daily.     insulin lispro protamine-lispro (75-25) 100 UNIT/ML Susp injection  Commonly known as:  HUMALOG 75/25 MIX  Inject 10 Units into the skin 2 (two) times daily. As directed     levETIRAcetam 500 MG tablet  Commonly known as:  KEPPRA  Take 250 mg by mouth at bedtime.     levothyroxine 175 MCG tablet  Commonly known as:  SYNTHROID, LEVOTHROID  Take 175 mcg by mouth daily before breakfast.     lovastatin 40 MG tablet  Commonly known as:  MEVACOR  TAKE ONE TABLET BY MOUTH AT BEDTIME     metroNIDAZOLE 500 MG  tablet  Commonly known as:  FLAGYL  Take 1 tablet (500 mg total) by mouth every 8 (eight) hours.     oxyCODONE 5 MG immediate release tablet  Commonly known as:  Oxy IR/ROXICODONE  Take 1 tablet (5 mg total) by mouth every 4 (four) hours as needed for severe pain.     tamsulosin 0.4 MG Caps capsule  Commonly known as:  FLOMAX  Take 0.4 mg by mouth.     Vitamin D (Ergocalciferol) 50000 UNITS Caps capsule  Commonly known as:  DRISDOL  Take 50,000 Units by mouth every 7 (seven) days. Takes on Sunday.           Follow-up Information    Follow up with [facility], MD. Schedule an appointment as soon as possible for a visit in 2 weeks.   Specialty:  General Surgery   Why:  For post-hospital follow up   Contact information:   [place] [address] [phone]       Follow up with Tula Nakayama, MD. Schedule an appointment as soon as possible  for a visit in 2 weeks.   Specialty:  Family Medicine   Contact information:   [address] [place] [address] [phone]       The results of significant diagnostics from this hospitalization (including imaging, microbiology, ancillary and laboratory) are listed below for reference.    Significant Diagnostic Studies: Ct Abdomen Pelvis Wo Contrast  12/01/2014   CLINICAL DATA:  Acute onset of abdominal pain and constipation for 3 days. Nausea. Initial encounter.  EXAM: CT ABDOMEN AND PELVIS WITHOUT CONTRAST  TECHNIQUE: Multidetector CT imaging of the abdomen and pelvis was performed following the standard protocol without IV contrast.  COMPARISON:  CT of the abdomen and pelvis from 08/10/2012, and renal ultrasound performed 03/20/2014  FINDINGS: Bibasilar atelectasis is noted, more prominent on the right, with trace right-sided pleural fluid.  A calcified granuloma is noted within the right hepatic lobe. The spleen is unremarkable.  There is mild soft tissue inflammation about the  gallbladder, raising concern for mild acute cholecystitis. Stones are noted within the gallbladder. There is a 1.2 cm stone lodged at the neck of the gallbladder. No intrahepatic biliary ductal dilatation is seen. The common hepatic duct remains normal in caliber.  The pancreas and adrenal glands are unremarkable.  There is incomplete rotation of the left kidney. Mild left renal atrophy is noted. Left-sided pelvicaliectasis is noted, without definite evidence of hydronephrosis. Nonspecific right-sided perinephric stranding is seen. Poorly characterized cysts are noted at the upper pole of the left kidney.  No free fluid is identified. The small bowel is unremarkable in appearance. The stomach is within normal limits. No acute vascular abnormalities are seen. Minimal calcification is noted at the distal abdominal aorta and its branches.  The appendix is mildly distended, but otherwise unremarkable. There is no definite evidence for appendicitis. The colon is partially filled with stool. Scattered diverticulosis is noted along the descending and proximal sigmoid colon, without evidence of diverticulitis.  The bladder is mildly distended and grossly unremarkable. A small urachal remnant is incidentally seen. The prostate is enlarged, measuring 5.3 cm in transverse dimension, with scattered calcification. Mildly enlarged bilateral inguinal nodes are seen, measuring up to 1.2 cm in short axis, with grossly normal fatty hila.  No acute osseous abnormalities are identified. There is chronic osseous fusion at L4-5, with associated endplate sclerotic change.  IMPRESSION: 1. Suspect acute cholecystitis, with mild soft tissue inflammation about the gallbladder, cholelithiasis and a 1.2 cm stone lodged at the neck of the gallbladder. 2. Mild left renal atrophy noted.  Suspect small left renal cysts. 3. Enlarged prostate noted. 4. Scattered diverticulosis along the descending and proximal sigmoid colon, without evidence of  diverticulitis. 5. Mildly enlarged bilateral inguinal nodes, stable in appearance. 6. Bibasilar atelectasis, more prominent on the right, with trace right-sided pleural fluid.   Electronically Signed   By: Garald Balding M.D.   On: 12/01/2014 00:48   Dg Chest 2 View  11/30/2014   CLINICAL DATA:  Acute onset of weakness and vomiting. Right flank pain and constipation. Initial encounter.  EXAM: CHEST  2 VIEW  COMPARISON:  Chest radiograph from 09/07/2012  FINDINGS: The lungs are hypoexpanded. Mild bibasilar atelectasis is noted. There is no evidence of pleural effusion or pneumothorax.  The heart is mildly enlarged. No acute osseous abnormalities are seen.  IMPRESSION: Lungs hypoexpanded, with mild bibasilar atelectasis. Mild cardiomegaly.   Electronically Signed   By: Garald Balding M.D.   On: 11/30/2014 23:54   Ir Perc  Cholecystostomy  12/05/2014   CLINICAL DATA:  Cholelithiasis and acute cholecystitis. The patient is not a candidate for cholecystectomy and requires placement of a percutaneous cholecystostomy tube.  EXAM: PERCUTANEOUS CHOLECYSTOSTOMY  COMPARISON:  CT of the abdomen on 12/01/2014  ANESTHESIA/SEDATION: 0.5 mg IV Versed; 25 mcg IV Fentanyl.  Total Moderate Sedation Time  10 minutes.  CONTRAST:  46mL OMNIPAQUE IOHEXOL 300 MG/ML  SOLN  MEDICATIONS: The patient had receive scheduled doses of IV Cipro and Flagyl in the morning. Additional prophylactic antibiotic was not administered.  FLUOROSCOPY TIME:  60 seconds.  PROCEDURE: The procedure, risks, benefits, and alternatives were explained to the patient. Questions regarding the procedure were encouraged and answered. The patient understands and consents to the procedure.  The right abdominal wall was prepped with Betadine in a sterile fashion, and a sterile drape was applied covering the operative field. A sterile gown and sterile gloves were used for the procedure. Local anesthesia was provided with 1% Lidocaine. A time-out procedure was  performed.  Ultrasound was utilized to localize the gallbladder. Under direct ultrasound guidance, a 21 gauge needle was advanced via a transhepatic approach into the gallbladder lumen. Aspiration was performed and a bile sample sent for culture studies. A small amount of diluted contrast material was injected. A guide wire was then advanced into the gallbladder. A transitional dilator was placed.  Percutaneous tract dilatation was then performed over a guide wire to 10-French. A 10-French pigtail drainage catheter was then advanced into the gallbladder lumen under fluoroscopy. Catheter was formed and injected with contrast material to confirm position. The catheter was flushed and connected to a gravity drainage bag. It was secured at the skin with a Prolene retention suture and Stat-Lock device.  COMPLICATIONS: None  FINDINGS: After needle puncture of the gallbladder, a bile sample was aspirated and sent for culture. Aspirated bile was turbid, bloody and purulent in appearance. The cholecystostomy tube was advanced into the gallbladder lumen and formed. It is now draining bile. This tube will be left to gravity drainage.  IMPRESSION: Percutaneous cholecystostomy with placement of 10-French drainage catheter into the gallbladder lumen. This was left to gravity drainage.   Electronically Signed   By: Aletta Edouard M.D.   On: 12/05/2014 13:43    Microbiology: Recent Results (from the past 240 hour(s))  Surgical pcr screen     Status: None   Collection Time: 12/02/14  7:34 PM  Result Value Ref Range Status   MRSA, PCR NEGATIVE NEGATIVE Final   Staphylococcus aureus NEGATIVE NEGATIVE Final    Comment:        The Xpert SA Assay (FDA approved for NASAL specimens in patients over 27 years of age), is one component of a comprehensive surveillance program.  Test performance has been validated by [employer] for patients greater than or equal to 29 year old. It is not intended to diagnose infection nor  to guide or monitor treatment.   Anaerobic culture     Status: None (Preliminary result)   Collection Time: 12/05/14 10:48 AM  Result Value Ref Range Status   Specimen Description FLUID BILE  Final   Special Requests Normal  Final   Gram Stain   Final    ABUNDANT WBC PRESENT,BOTH PMN AND MONONUCLEAR NO ORGANISMS SEEN Performed at [facility]    Culture   Final    NO ANAEROBES ISOLATED; CULTURE IN PROGRESS FOR 5 DAYS Performed at [facility]    Report Status PENDING  Incomplete  Body  fluid culture     Status: None (Preliminary result)   Collection Time: 12/05/14 10:48 AM  Result Value Ref Range Status   Specimen Description FLUID BILE  Final   Special Requests NONE  Final   Gram Stain   Final    ABUNDANT WBC PRESENT,BOTH PMN AND MONONUCLEAR NO ORGANISMS SEEN Performed at [facility]    Culture PENDING  Incomplete   Report Status PENDING  Incomplete     Labs: Basic Metabolic Panel:  Recent Labs Lab 12/02/14 0425 12/03/14 0839 12/04/14 0500 12/05/14 0743 12/06/14 0536  NA 132* 135 133* 136 134*  K 4.2 4.1 4.3 4.0 4.3  CL 104 108 105 110 108  CO2 23 20 18* 17* 20  GLUCOSE 126* 113* 152* 96 133*  BUN 37* 38* 40* 37* 31*  CREATININE 2.74* 2.66* 2.68* 2.40* 2.28*  CALCIUM 8.3* 8.3* 8.2* 8.2* 8.4   Liver Function Tests:  Recent Labs Lab 12/02/14 0425 12/03/14 0839 12/05/14 0743 12/06/14 0535 12/06/14 0536  AST 28 70* 284* 103* 105*  ALT 22 38 166* 122* 121*  ALKPHOS 82 222* 454* 361* 377*  BILITOT 0.6 0.8 0.9 0.5 0.6  PROT 6.1 5.9* 5.7* 5.5* 5.5*  ALBUMIN 2.4* 2.4* 2.4* 2.4* 2.4*    Recent Labs Lab 11/30/14 2307 12/03/14 0839  LIPASE 24 19   No results for input(s): AMMONIA in the last 168 hours. CBC:  Recent Labs Lab 11/30/14 2307 12/01/14 0434 12/02/14 1104 12/03/14 0801 12/04/14 0500 12/05/14 0743 12/06/14 0536  WBC 15.2* 14.8* 11.5* 11.9* 10.1 8.6 6.7  NEUTROABS 12.9* 12.2*  --   --   --   --   --   HGB  8.8* 9.2* 8.6* 9.2* 8.8* 9.0* 9.0*  HCT 26.8* 28.0* 26.6* 27.5* 27.6* 27.4* 27.8*  MCV 94.0 94.3 92.0 93.9 92.3 93.8 91.4  PLT 213 220 227 244 256 281 317   Cardiac Enzymes:  Recent Labs Lab 12/01/14 0956 12/01/14 1558 12/01/14 2311 12/02/14 0425 12/02/14 1104  TROPONINI 0.04* 0.07* 0.05* 0.05* 0.05*   BNP: BNP (last 3 results) No results for input(s): PROBNP in the last 8760 hours. CBG:  Recent Labs Lab 12/05/14 2142 12/06/14 0146 12/06/14 0436 12/06/14 0755 12/06/14 1124  GLUCAP 199* 113* 130* 133* 253*       Signed:  Lova Urbieta  [facility] 12/06/2014, 12:49 PM

## 2014-12-09 LAB — BODY FLUID CULTURE

## 2014-12-10 LAB — ANAEROBIC CULTURE: Special Requests: NORMAL

## 2014-12-19 DIAGNOSIS — L97322 Non-pressure chronic ulcer of left ankle with fat layer exposed: Secondary | ICD-10-CM | POA: Diagnosis not present

## 2014-12-19 DIAGNOSIS — L97321 Non-pressure chronic ulcer of left ankle limited to breakdown of skin: Secondary | ICD-10-CM | POA: Diagnosis not present

## 2014-12-19 DIAGNOSIS — I872 Venous insufficiency (chronic) (peripheral): Secondary | ICD-10-CM | POA: Diagnosis not present

## 2014-12-25 DIAGNOSIS — E871 Hypo-osmolality and hyponatremia: Secondary | ICD-10-CM | POA: Diagnosis not present

## 2014-12-25 DIAGNOSIS — D649 Anemia, unspecified: Secondary | ICD-10-CM | POA: Diagnosis not present

## 2014-12-25 DIAGNOSIS — N184 Chronic kidney disease, stage 4 (severe): Secondary | ICD-10-CM | POA: Diagnosis not present

## 2014-12-25 DIAGNOSIS — I10 Essential (primary) hypertension: Secondary | ICD-10-CM | POA: Diagnosis not present

## 2014-12-26 ENCOUNTER — Ambulatory Visit: Payer: Medicare Other | Admitting: Family Medicine

## 2015-01-02 ENCOUNTER — Other Ambulatory Visit (INDEPENDENT_AMBULATORY_CARE_PROVIDER_SITE_OTHER): Payer: Self-pay | Admitting: General Surgery

## 2015-01-02 DIAGNOSIS — K8000 Calculus of gallbladder with acute cholecystitis without obstruction: Secondary | ICD-10-CM

## 2015-01-02 DIAGNOSIS — K810 Acute cholecystitis: Secondary | ICD-10-CM | POA: Diagnosis not present

## 2015-01-02 DIAGNOSIS — N189 Chronic kidney disease, unspecified: Secondary | ICD-10-CM | POA: Diagnosis not present

## 2015-01-07 DIAGNOSIS — Z8673 Personal history of transient ischemic attack (TIA), and cerebral infarction without residual deficits: Secondary | ICD-10-CM | POA: Diagnosis not present

## 2015-01-07 DIAGNOSIS — F329 Major depressive disorder, single episode, unspecified: Secondary | ICD-10-CM | POA: Diagnosis not present

## 2015-01-07 DIAGNOSIS — T85518A Breakdown (mechanical) of other gastrointestinal prosthetic devices, implants and grafts, initial encounter: Secondary | ICD-10-CM | POA: Diagnosis not present

## 2015-01-07 DIAGNOSIS — K810 Acute cholecystitis: Secondary | ICD-10-CM | POA: Diagnosis not present

## 2015-01-07 DIAGNOSIS — I872 Venous insufficiency (chronic) (peripheral): Secondary | ICD-10-CM | POA: Diagnosis not present

## 2015-01-07 DIAGNOSIS — I739 Peripheral vascular disease, unspecified: Secondary | ICD-10-CM | POA: Diagnosis not present

## 2015-01-07 DIAGNOSIS — L97329 Non-pressure chronic ulcer of left ankle with unspecified severity: Secondary | ICD-10-CM | POA: Diagnosis not present

## 2015-01-07 DIAGNOSIS — E038 Other specified hypothyroidism: Secondary | ICD-10-CM | POA: Diagnosis not present

## 2015-01-07 DIAGNOSIS — E6609 Other obesity due to excess calories: Secondary | ICD-10-CM | POA: Diagnosis not present

## 2015-01-07 DIAGNOSIS — R569 Unspecified convulsions: Secondary | ICD-10-CM | POA: Diagnosis not present

## 2015-01-07 DIAGNOSIS — N184 Chronic kidney disease, stage 4 (severe): Secondary | ICD-10-CM | POA: Diagnosis not present

## 2015-01-07 DIAGNOSIS — R2689 Other abnormalities of gait and mobility: Secondary | ICD-10-CM | POA: Diagnosis not present

## 2015-01-07 DIAGNOSIS — Z794 Long term (current) use of insulin: Secondary | ICD-10-CM | POA: Diagnosis not present

## 2015-01-07 DIAGNOSIS — E119 Type 2 diabetes mellitus without complications: Secondary | ICD-10-CM | POA: Diagnosis not present

## 2015-01-07 DIAGNOSIS — E669 Obesity, unspecified: Secondary | ICD-10-CM | POA: Diagnosis not present

## 2015-01-07 DIAGNOSIS — E875 Hyperkalemia: Secondary | ICD-10-CM | POA: Diagnosis not present

## 2015-01-07 DIAGNOSIS — I10 Essential (primary) hypertension: Secondary | ICD-10-CM | POA: Diagnosis not present

## 2015-01-07 DIAGNOSIS — L97829 Non-pressure chronic ulcer of other part of left lower leg with unspecified severity: Secondary | ICD-10-CM | POA: Diagnosis not present

## 2015-01-07 DIAGNOSIS — Z79899 Other long term (current) drug therapy: Secondary | ICD-10-CM | POA: Diagnosis not present

## 2015-01-07 DIAGNOSIS — R809 Proteinuria, unspecified: Secondary | ICD-10-CM | POA: Diagnosis not present

## 2015-01-07 DIAGNOSIS — I83023 Varicose veins of left lower extremity with ulcer of ankle: Secondary | ICD-10-CM | POA: Diagnosis not present

## 2015-01-07 DIAGNOSIS — Z880 Allergy status to penicillin: Secondary | ICD-10-CM | POA: Diagnosis not present

## 2015-01-07 DIAGNOSIS — R293 Abnormal posture: Secondary | ICD-10-CM | POA: Diagnosis not present

## 2015-01-07 DIAGNOSIS — N183 Chronic kidney disease, stage 3 (moderate): Secondary | ICD-10-CM | POA: Diagnosis not present

## 2015-01-07 DIAGNOSIS — D638 Anemia in other chronic diseases classified elsewhere: Secondary | ICD-10-CM | POA: Diagnosis not present

## 2015-01-07 DIAGNOSIS — E785 Hyperlipidemia, unspecified: Secondary | ICD-10-CM | POA: Diagnosis not present

## 2015-01-07 DIAGNOSIS — E1140 Type 2 diabetes mellitus with diabetic neuropathy, unspecified: Secondary | ICD-10-CM | POA: Diagnosis not present

## 2015-01-07 DIAGNOSIS — M6281 Muscle weakness (generalized): Secondary | ICD-10-CM | POA: Diagnosis not present

## 2015-01-07 DIAGNOSIS — K838 Other specified diseases of biliary tract: Secondary | ICD-10-CM | POA: Diagnosis not present

## 2015-01-07 DIAGNOSIS — E782 Mixed hyperlipidemia: Secondary | ICD-10-CM | POA: Diagnosis not present

## 2015-01-07 DIAGNOSIS — I1310 Hypertensive heart and chronic kidney disease without heart failure, with stage 1 through stage 4 chronic kidney disease, or unspecified chronic kidney disease: Secondary | ICD-10-CM | POA: Diagnosis not present

## 2015-01-07 DIAGNOSIS — K819 Cholecystitis, unspecified: Secondary | ICD-10-CM | POA: Diagnosis present

## 2015-01-07 DIAGNOSIS — I129 Hypertensive chronic kidney disease with stage 1 through stage 4 chronic kidney disease, or unspecified chronic kidney disease: Secondary | ICD-10-CM | POA: Diagnosis not present

## 2015-01-07 DIAGNOSIS — L97322 Non-pressure chronic ulcer of left ankle with fat layer exposed: Secondary | ICD-10-CM | POA: Diagnosis not present

## 2015-01-07 DIAGNOSIS — D649 Anemia, unspecified: Secondary | ICD-10-CM | POA: Diagnosis not present

## 2015-01-07 DIAGNOSIS — E1165 Type 2 diabetes mellitus with hyperglycemia: Secondary | ICD-10-CM | POA: Diagnosis not present

## 2015-01-07 DIAGNOSIS — E871 Hypo-osmolality and hyponatremia: Secondary | ICD-10-CM | POA: Diagnosis not present

## 2015-01-07 DIAGNOSIS — R278 Other lack of coordination: Secondary | ICD-10-CM | POA: Diagnosis not present

## 2015-01-07 DIAGNOSIS — R41841 Cognitive communication deficit: Secondary | ICD-10-CM | POA: Diagnosis not present

## 2015-01-07 DIAGNOSIS — Z7982 Long term (current) use of aspirin: Secondary | ICD-10-CM | POA: Diagnosis not present

## 2015-01-07 DIAGNOSIS — E039 Hypothyroidism, unspecified: Secondary | ICD-10-CM | POA: Diagnosis not present

## 2015-01-07 DIAGNOSIS — Z886 Allergy status to analgesic agent status: Secondary | ICD-10-CM | POA: Diagnosis not present

## 2015-01-07 DIAGNOSIS — Z4889 Encounter for other specified surgical aftercare: Secondary | ICD-10-CM | POA: Diagnosis present

## 2015-01-15 ENCOUNTER — Ambulatory Visit (HOSPITAL_COMMUNITY): Admission: RE | Admit: 2015-01-15 | Payer: Medicare Other | Source: Ambulatory Visit

## 2015-01-18 DIAGNOSIS — E1165 Type 2 diabetes mellitus with hyperglycemia: Secondary | ICD-10-CM | POA: Diagnosis not present

## 2015-01-18 DIAGNOSIS — E6609 Other obesity due to excess calories: Secondary | ICD-10-CM | POA: Diagnosis not present

## 2015-01-18 DIAGNOSIS — E782 Mixed hyperlipidemia: Secondary | ICD-10-CM | POA: Diagnosis not present

## 2015-01-18 DIAGNOSIS — E038 Other specified hypothyroidism: Secondary | ICD-10-CM | POA: Diagnosis not present

## 2015-01-18 DIAGNOSIS — I10 Essential (primary) hypertension: Secondary | ICD-10-CM | POA: Diagnosis not present

## 2015-01-21 ENCOUNTER — Ambulatory Visit (HOSPITAL_COMMUNITY): Payer: Medicare Other

## 2015-01-22 ENCOUNTER — Ambulatory Visit (HOSPITAL_COMMUNITY)
Admission: RE | Admit: 2015-01-22 | Discharge: 2015-01-22 | Disposition: A | Discharge status: Home / Self Care | Payer: Medicare Other | Source: Ambulatory Visit | Attending: General Surgery | Admitting: General Surgery

## 2015-01-22 ENCOUNTER — Other Ambulatory Visit (INDEPENDENT_AMBULATORY_CARE_PROVIDER_SITE_OTHER): Payer: Self-pay | Admitting: General Surgery

## 2015-01-22 DIAGNOSIS — E669 Obesity, unspecified: Secondary | ICD-10-CM | POA: Insufficient documentation

## 2015-01-22 DIAGNOSIS — Z4889 Encounter for other specified surgical aftercare: Secondary | ICD-10-CM | POA: Insufficient documentation

## 2015-01-22 DIAGNOSIS — Z880 Allergy status to penicillin: Secondary | ICD-10-CM | POA: Insufficient documentation

## 2015-01-22 DIAGNOSIS — K810 Acute cholecystitis: Secondary | ICD-10-CM | POA: Diagnosis not present

## 2015-01-22 DIAGNOSIS — I1310 Hypertensive heart and chronic kidney disease without heart failure, with stage 1 through stage 4 chronic kidney disease, or unspecified chronic kidney disease: Secondary | ICD-10-CM | POA: Diagnosis not present

## 2015-01-22 DIAGNOSIS — K8000 Calculus of gallbladder with acute cholecystitis without obstruction: Secondary | ICD-10-CM

## 2015-01-22 DIAGNOSIS — I739 Peripheral vascular disease, unspecified: Secondary | ICD-10-CM | POA: Insufficient documentation

## 2015-01-22 DIAGNOSIS — Z794 Long term (current) use of insulin: Secondary | ICD-10-CM | POA: Insufficient documentation

## 2015-01-22 DIAGNOSIS — E039 Hypothyroidism, unspecified: Secondary | ICD-10-CM | POA: Insufficient documentation

## 2015-01-22 DIAGNOSIS — N184 Chronic kidney disease, stage 4 (severe): Secondary | ICD-10-CM | POA: Insufficient documentation

## 2015-01-22 DIAGNOSIS — Z886 Allergy status to analgesic agent status: Secondary | ICD-10-CM | POA: Insufficient documentation

## 2015-01-22 DIAGNOSIS — Z8673 Personal history of transient ischemic attack (TIA), and cerebral infarction without residual deficits: Secondary | ICD-10-CM | POA: Insufficient documentation

## 2015-01-22 DIAGNOSIS — E1140 Type 2 diabetes mellitus with diabetic neuropathy, unspecified: Secondary | ICD-10-CM | POA: Insufficient documentation

## 2015-01-22 DIAGNOSIS — F329 Major depressive disorder, single episode, unspecified: Secondary | ICD-10-CM | POA: Insufficient documentation

## 2015-01-22 DIAGNOSIS — E785 Hyperlipidemia, unspecified: Secondary | ICD-10-CM | POA: Insufficient documentation

## 2015-01-22 DIAGNOSIS — Z79899 Other long term (current) drug therapy: Secondary | ICD-10-CM | POA: Insufficient documentation

## 2015-01-22 MED ORDER — IOHEXOL 300 MG/ML  SOLN
50.0000 mL | Freq: Once | INTRAMUSCULAR | Status: AC | PRN
  Administered 2015-01-22: 10 mL

## 2015-01-22 NOTE — Progress Notes (Signed)
Patient ID: Chase [name], male   DOB: 1931-12-06, 81 y.o.   MRN: [id number]    Chief Complaint: History of acute cholecystitis, post ultrasound and fluoroscopic guided percutaneous close assessment to placement on 12/06/2015.  Referring Physician(s): Thompson,Burke  History of Present Illness: Chase [name] is a 81 y.o. male with complex medical history who was admitted in late December with acute cholecystitis who underwent an ultrasound and fluoroscopic guided close assessment to placement on 12/05/2014 by Dr. Kathlene Cote secondary to his poor surgical candidacy. The cholecystostomy has been maintained to a gravity bag since its placement.  Patient has tolerated the percutaneous close assessments tube well and presents to the interventional radiology drain clinic for percutaneous cholangiogram.  The patient denies recurrence of his right upper quadrant abdominal pain since placement of the percutaneous call cystostomy tube. No fever or chills. The patient is accompanied by his wife and son.  Past Medical History  Diagnosis Date  . Peripheral vascular disease, unspecified   . Depressive disorder, not elsewhere classified   . Obesity   . Other and unspecified hyperlipidemia   . Osteoarthrosis, unspecified whether generalized or localized, lower leg   . Spondylosis of unspecified site without mention of myelopathy   . Spinal stenosis, unspecified region other than cervical   . Seizures   . Complete lesion of cervical spinal cord 03/14/3012    Stable since 2006  . Lacunar stroke, acute 03/14/2012  . Bradycardia 03/15/2012  . Diabetic neuropathy   . CKD (chronic kidney disease) stage 4, GFR 15-29 ml/min   . Diabetes mellitus approx 1994  . Hypothyroidism approx 2000  . Hypertensive heart disease   . CVA (cerebrovascular accident) 09/07/12    Past Surgical History  Procedure Laterality Date  . Kidney stones left x2  1975  . Kidney surgery      Ruptured left kidney 30 yrs ago  from a  kidney stone  . Colonoscopy N/A 08/10/2013    Procedure: COLONOSCOPY;  Surgeon: Rogene Houston, MD;  Location: [facility];  Service: Endoscopy;  Laterality: N/A;  240    Allergies: Bayer advanced aspirin and Penicillins  Medications: Prior to Admission medications   Medication Sig Start Date End Date Taking? Authorizing Provider  amLODipine (NORVASC) 10 MG tablet TAKE ONE TABLET BY MOUTH ONCE DAILY 07/26/14   [name], MD  aspirin EC 81 MG tablet Take 1 tablet (81 mg total) by mouth daily. 06/16/13   [name], MD  chlorthalidone (HYGROTON) 50 MG tablet Take 1 tablet (50 mg total) by mouth daily. 06/12/14   [name], MD  ciprofloxacin (CIPRO) 500 MG tablet Take 1 tablet (500 mg total) by mouth daily. 12/06/14   Costin Karlyne Greenspan, MD  clopidogrel (PLAVIX) 75 MG tablet TAKE ONE TABLET BY MOUTH IN THE MORNING WITH BREAKFAST Patient taking differently: Take 75 mg by mouth daily with breakfast.  02/27/14   [name], MD  hydrALAZINE (APRESOLINE) 50 MG tablet Take 1 tablet (50 mg total) by mouth 2 (two) times daily. 10/16/14   Herminio Commons, MD  insulin lispro protamine-insulin lispro (HUMALOG 75/25) (75-25) 100 UNIT/ML SUSP Inject 10 Units into the skin 2 (two) times daily. As directed    Historical Provider, MD  levETIRAcetam (KEPPRA) 500 MG tablet Take 250 mg by mouth at bedtime.     Historical Provider, MD  levothyroxine (SYNTHROID, LEVOTHROID) 175 MCG tablet Take 175 mcg by mouth daily before breakfast.  04/27/14   Historical Provider, MD  lovastatin (MEVACOR) 40 MG tablet TAKE ONE TABLET BY MOUTH AT BEDTIME 07/26/14   [name], MD  metroNIDAZOLE (FLAGYL) 500 MG tablet Take 1 tablet (500 mg total) by mouth every 8 (eight) hours. 12/06/14   Costin Karlyne Greenspan, MD  Multiple Vitamins-Minerals (EYE VITAMINS PO) Take 1 tablet by mouth daily.     Historical Provider, MD  oxyCODONE (OXY IR/ROXICODONE) 5 MG immediate release tablet Take 1 tablet (5 mg  total) by mouth every 4 (four) hours as needed for severe pain. 12/06/14   Costin Karlyne Greenspan, MD  tamsulosin (FLOMAX) 0.4 MG CAPS capsule Take 0.4 mg by mouth.    Historical Provider, MD  Vitamin D, Ergocalciferol, (DRISDOL) 50000 UNITS CAPS capsule Take 50,000 Units by mouth every 7 (seven) days. Takes on Sunday. 04/27/14   Historical Provider, MD     Family History  Problem Relation Age of Onset  . Diabetes Mother   . Prostate cancer Father   . Diabetes Brother   . Diabetes Brother   . Hypertension Brother   . Hypertension Brother   . Hypertension Brother     History   Social History  . Marital Status: Married    Spouse Name: N/A  . Number of Children: 5  . Years of Education: N/A   Occupational History  . retired     Social History Main Topics  . Smoking status: Never Smoker   . Smokeless tobacco: Never Used  . Alcohol Use: No  . Drug Use: No  . Sexual Activity: No   Other Topics Concern  . Not on file   Social History Narrative   Review of Systems: A 12 point ROS discussed and pertinent positives are indicated in the HPI above.  All other systems are negative.  Review of Systems  Vital Signs: There were no vitals taken for this visit.  Physical Exam  Abdominal:  Percutaneous chole tube exit site appears normal without significant surrounding erythema.   Imaging:  CT abdomen and pelvis - 12/01/2014; ultrasound fluoroscopic guided percutaneous cholecystostomy tube placement-12/05/2014; percutaneous cholangiogram performed the cholecystostomy tube - earlier same day  Percutaneous cholangiogram performed earlier same day demonstrates wide patency of the cystic duct and CBD to the level of the widened. No discrete intraluminal filling defect to suggest choledocholithiasis.  Labs:  CBC:  Recent Labs  12/03/14 0801 12/04/14 0500 12/05/14 0743 12/06/14 0536  WBC 11.9* 10.1 8.6 6.7  HGB 9.2* 8.8* 9.0* 9.0*  HCT 27.5* 27.6* 27.4* 27.8*  PLT 244 256 281 317     COAGS:  Recent Labs  12/05/14 0743  INR 1.48  APTT 39*    BMP:  Recent Labs  12/03/14 0839 12/04/14 0500 12/05/14 0743 12/06/14 0536  NA 135 133* 136 134*  K 4.1 4.3 4.0 4.3  CL 108 105 110 108  CO2 20 18* 17* 20  GLUCOSE 113* 152* 96 133*  BUN 38* 40* 37* 31*  CALCIUM 8.3* 8.2* 8.2* 8.4  CREATININE 2.66* 2.68* 2.40* 2.28*  GFRNONAA 21* 21* 24* 25*  GFRAA 24* 24* 28* 29*    LIVER FUNCTION TESTS:  Recent Labs  12/03/14 0839 12/05/14 0743 12/06/14 0535 12/06/14 0536  BILITOT 0.8 0.9 0.5 0.6  AST 70* 284* 103* 105*  ALT 38 166* 122* 121*  ALKPHOS 222* 454* 361* 377*  PROT 5.9* 5.7* 5.5* 5.5*  ALBUMIN 2.4* 2.4* 2.4* 2.4*    TUMOR MARKERS: No results for input(s): AFPTM, CEA, CA199, CHROMGRNA in the last 8760 hours.  Assessment  and Plan:  Chase [name] is a 81 y.o. male with complex medical history who was admitted in late December with acute cholecystitis who underwent an ultrasound and fluoroscopic guided close assessment to placement on 12/05/2014 by Dr. Kathlene Cote secondary to his poor surgical candidacy. The cholecystostomy has been maintained to a gravity bag since its placement.  Percutaneous cholangiogram performed earlier same day demonstrates wide patency of the cystic duct and CBD to the level of the duodenum.  As such, the cholecystomy tube was capped for a trial of internalization.   The patient was given a new gravity bag and instructed to reconnect the cholecystostomy tube to the gravity bag if he were to develop recurrent right upper quadrant abdominal pain. The patient and the patient's family demonstrated fair understanding of this recommendation.  The patient will return to the interventional radiology clinic in 1 week, on 8/23.    Following a discussion with the referring surgeon, Dr. Grandville Silos, as the patient remains a poor surgical candidate, if he passes a trial of internalization, the cholecystostomy tube may be removed at that  time.  SignedSandi Mariscal 01/22/2015, 12:38 PM   I spent a total of 15 Minutes in face to face in clinical consultation, greater than 50% of which was counseling/coordinating care for percutaneous cholecystostomy tube.

## 2015-01-23 DIAGNOSIS — L97829 Non-pressure chronic ulcer of other part of left lower leg with unspecified severity: Secondary | ICD-10-CM | POA: Diagnosis not present

## 2015-01-23 DIAGNOSIS — I872 Venous insufficiency (chronic) (peripheral): Secondary | ICD-10-CM | POA: Diagnosis not present

## 2015-01-23 DIAGNOSIS — L97322 Non-pressure chronic ulcer of left ankle with fat layer exposed: Secondary | ICD-10-CM | POA: Diagnosis not present

## 2015-01-24 ENCOUNTER — Telehealth: Payer: Self-pay | Admitting: *Deleted

## 2015-01-24 NOTE — Telephone Encounter (Signed)
Pt's wife called requesting to speak with a nurse regarding his upcoming appt. Please advise

## 2015-01-28 ENCOUNTER — Telehealth (HOSPITAL_COMMUNITY): Payer: Self-pay | Admitting: Interventional Radiology

## 2015-01-28 NOTE — Telephone Encounter (Signed)
Called pt's wife, left VM for her to call to schedule pt for drain clinic JM

## 2015-01-29 ENCOUNTER — Ambulatory Visit: Payer: Medicare Other | Admitting: Family Medicine

## 2015-01-29 NOTE — Telephone Encounter (Signed)
Called and spoke with wife.  Patient is still in [place].  Appointment for today cancelled.

## 2015-01-31 ENCOUNTER — Other Ambulatory Visit (HOSPITAL_COMMUNITY): Payer: Self-pay | Admitting: Interventional Radiology

## 2015-01-31 DIAGNOSIS — L0291 Cutaneous abscess, unspecified: Secondary | ICD-10-CM

## 2015-02-04 DIAGNOSIS — K810 Acute cholecystitis: Secondary | ICD-10-CM | POA: Diagnosis not present

## 2015-02-04 DIAGNOSIS — E785 Hyperlipidemia, unspecified: Secondary | ICD-10-CM | POA: Diagnosis not present

## 2015-02-04 DIAGNOSIS — E039 Hypothyroidism, unspecified: Secondary | ICD-10-CM | POA: Diagnosis not present

## 2015-02-05 ENCOUNTER — Ambulatory Visit (HOSPITAL_COMMUNITY)
Admission: RE | Admit: 2015-02-05 | Discharge: 2015-02-05 | Disposition: A | Discharge status: Home / Self Care | Payer: Medicare Other | Source: Ambulatory Visit | Attending: Interventional Radiology | Admitting: Interventional Radiology

## 2015-02-05 DIAGNOSIS — F329 Major depressive disorder, single episode, unspecified: Secondary | ICD-10-CM | POA: Insufficient documentation

## 2015-02-05 DIAGNOSIS — E785 Hyperlipidemia, unspecified: Secondary | ICD-10-CM | POA: Diagnosis not present

## 2015-02-05 DIAGNOSIS — K819 Cholecystitis, unspecified: Secondary | ICD-10-CM | POA: Diagnosis not present

## 2015-02-05 DIAGNOSIS — E1140 Type 2 diabetes mellitus with diabetic neuropathy, unspecified: Secondary | ICD-10-CM | POA: Insufficient documentation

## 2015-02-05 DIAGNOSIS — Z8673 Personal history of transient ischemic attack (TIA), and cerebral infarction without residual deficits: Secondary | ICD-10-CM | POA: Insufficient documentation

## 2015-02-05 DIAGNOSIS — R569 Unspecified convulsions: Secondary | ICD-10-CM | POA: Insufficient documentation

## 2015-02-05 DIAGNOSIS — K838 Other specified diseases of biliary tract: Secondary | ICD-10-CM | POA: Diagnosis not present

## 2015-02-05 DIAGNOSIS — I739 Peripheral vascular disease, unspecified: Secondary | ICD-10-CM | POA: Insufficient documentation

## 2015-02-05 DIAGNOSIS — K810 Acute cholecystitis: Secondary | ICD-10-CM | POA: Diagnosis not present

## 2015-02-05 DIAGNOSIS — L0291 Cutaneous abscess, unspecified: Secondary | ICD-10-CM

## 2015-02-05 DIAGNOSIS — Z794 Long term (current) use of insulin: Secondary | ICD-10-CM | POA: Insufficient documentation

## 2015-02-05 DIAGNOSIS — I1310 Hypertensive heart and chronic kidney disease without heart failure, with stage 1 through stage 4 chronic kidney disease, or unspecified chronic kidney disease: Secondary | ICD-10-CM | POA: Insufficient documentation

## 2015-02-05 DIAGNOSIS — N184 Chronic kidney disease, stage 4 (severe): Secondary | ICD-10-CM | POA: Insufficient documentation

## 2015-02-05 DIAGNOSIS — Z7982 Long term (current) use of aspirin: Secondary | ICD-10-CM | POA: Insufficient documentation

## 2015-02-05 DIAGNOSIS — E039 Hypothyroidism, unspecified: Secondary | ICD-10-CM | POA: Insufficient documentation

## 2015-02-05 MED ORDER — IOHEXOL 300 MG/ML  SOLN
50.0000 mL | Freq: Once | INTRAMUSCULAR | Status: AC | PRN
  Administered 2015-02-05: 10 mL via INTRAVENOUS

## 2015-02-05 NOTE — Progress Notes (Signed)
Patient ID: Chase [name], male   DOB: 25-May-1931, 81 y.o.   MRN: [id number]    Chief Complaint: No chief complaint on file.  cholecystostomy tube  Referring Physician(s): Watts,John  History of Present Illness: Chase [name] is a 81 y.o. male with a history of cholecystitis who had a cholecystostomy tube placed 2 months ago. It was capped but the patient experienced pain 3 days ago. The back was reattached and he felt better. He denies fever.  Past Medical History  Diagnosis Date  . Peripheral vascular disease, unspecified   . Depressive disorder, not elsewhere classified   . Obesity   . Other and unspecified hyperlipidemia   . Osteoarthrosis, unspecified whether generalized or localized, lower leg   . Spondylosis of unspecified site without mention of myelopathy   . Spinal stenosis, unspecified region other than cervical   . Seizures   . Complete lesion of cervical spinal cord 03/14/3012    Stable since 2006  . Lacunar stroke, acute 03/14/2012  . Bradycardia 03/15/2012  . Diabetic neuropathy   . CKD (chronic kidney disease) stage 4, GFR 15-29 ml/min   . Diabetes mellitus approx 1994  . Hypothyroidism approx 2000  . Hypertensive heart disease   . CVA (cerebrovascular accident) 09/07/12    Past Surgical History  Procedure Laterality Date  . Kidney stones left x2  1975  . Kidney surgery      Ruptured left kidney 30 yrs ago  from a kidney stone  . Colonoscopy N/A 08/10/2013    Procedure: COLONOSCOPY;  Surgeon: Rogene Houston, MD;  Location: [facility];  Service: Endoscopy;  Laterality: N/A;  240    Allergies: Bayer advanced aspirin and Penicillins  Medications: Prior to Admission medications   Medication Sig Start Date End Date Taking? Authorizing Provider  amLODipine (NORVASC) 10 MG tablet TAKE ONE TABLET BY MOUTH ONCE DAILY 07/26/14   [name], MD  aspirin EC 81 MG tablet Take 1 tablet (81 mg total) by mouth daily. 06/16/13   [name], MD    chlorthalidone (HYGROTON) 50 MG tablet Take 1 tablet (50 mg total) by mouth daily. 06/12/14   [name], MD  ciprofloxacin (CIPRO) 500 MG tablet Take 1 tablet (500 mg total) by mouth daily. 12/06/14   Costin Karlyne Greenspan, MD  clopidogrel (PLAVIX) 75 MG tablet TAKE ONE TABLET BY MOUTH IN THE MORNING WITH BREAKFAST Patient taking differently: Take 75 mg by mouth daily with breakfast.  02/27/14   [name], MD  hydrALAZINE (APRESOLINE) 50 MG tablet Take 1 tablet (50 mg total) by mouth 2 (two) times daily. 10/16/14   Herminio Commons, MD  insulin lispro protamine-insulin lispro (HUMALOG 75/25) (75-25) 100 UNIT/ML SUSP Inject 10 Units into the skin 2 (two) times daily. As directed    Historical Provider, MD  levETIRAcetam (KEPPRA) 500 MG tablet Take 250 mg by mouth at bedtime.     Historical Provider, MD  levothyroxine (SYNTHROID, LEVOTHROID) 175 MCG tablet Take 175 mcg by mouth daily before breakfast.  04/27/14   Historical Provider, MD  lovastatin (MEVACOR) 40 MG tablet TAKE ONE TABLET BY MOUTH AT BEDTIME 07/26/14   [name], MD  metroNIDAZOLE (FLAGYL) 500 MG tablet Take 1 tablet (500 mg total) by mouth every 8 (eight) hours. 12/06/14   Costin Karlyne Greenspan, MD  Multiple Vitamins-Minerals (EYE VITAMINS PO) Take 1 tablet by mouth daily.     Historical Provider, MD  oxyCODONE (OXY IR/ROXICODONE) 5 MG immediate release  tablet Take 1 tablet (5 mg total) by mouth every 4 (four) hours as needed for severe pain. 12/06/14   Costin Karlyne Greenspan, MD  tamsulosin (FLOMAX) 0.4 MG CAPS capsule Take 0.4 mg by mouth.    Historical Provider, MD  Vitamin D, Ergocalciferol, (DRISDOL) 50000 UNITS CAPS capsule Take 50,000 Units by mouth every 7 (seven) days. Takes on Sunday. 04/27/14   Historical Provider, MD     Family History  Problem Relation Age of Onset  . Diabetes Mother   . Prostate cancer Father   . Diabetes Brother   . Diabetes Brother   . Hypertension Brother   . Hypertension Brother   .  Hypertension Brother     History   Social History  . Marital Status: Married    Spouse Name: N/A  . Number of Children: 5  . Years of Education: N/A   Occupational History  . retired     Social History Main Topics  . Smoking status: Never Smoker   . Smokeless tobacco: Never Used  . Alcohol Use: No  . Drug Use: No  . Sexual Activity: No   Other Topics Concern  . Not on file   Social History Narrative     Review of Systems: A 12 point ROS discussed and pertinent positives are indicated in the HPI above.  All other systems are negative.  Review of Systems  Vital Signs: There were no vitals taken for this visit.  Physical Exam  Constitutional: He appears well-developed and well-nourished.  Abdominal:  Cholecystostomy tube drain site is clean and dry. No signs of infection.    Mallampati Score:     Imaging: Ir Sinus/fist Tube Chk-non Gi  02/05/2015   CLINICAL DATA:  Followup injection  EXAM: SINUS TRACT INJECTION/FISTULOGRAM  PROCEDURE: Contrast was injected into the cholecystostomy tube. It was reattached to the gravity bag.  FINDINGS: Cystic duct is patent.  Common bile duct is patent.  IMPRESSION: Cystic and common bile ducts are patent.   Electronically Signed   By: Marybelle Killings M.D.   On: 02/05/2015 12:11   Ir Sinus/fist Tube Chk-non Gi  01/22/2015   CLINICAL DATA:  History of acute cholecystitis, post ultrasound fluoroscopic guided cholecystostomy tube placement (01/22/2015. Please perform fluoroscopic guided cholangiogram to evaluate for patency of the cystic duct.  EXAM: SINUS TRACT INJECTION/FISTULOGRAM  CONTRAST:  4mL OMNIPAQUE IOHEXOL 300 MG/ML SOLN - administered into the gallbladder fossa and biliary tree  FLUOROSCOPY TIME:  24 seconds (50 mGy)  COMPARISON:  Ultrasound and fluoroscopic guided cholecystostomy tube placement- 12/05/2014; CT abdomen pelvis - 12/01/2014.  TECHNIQUE: Patient was placed supine on the fluoroscopy table. Preprocedural spot  fluoroscopic image was obtained of the right upper abdomen an existing cholecystostomy tube.  Multiple spot fluoroscopic and radiographic images were obtained in various obliquities following the injection of a small amount of contrast via the existing cholecystostomy tube.  Images were reviewed and the procedure was terminated.  The cholecystostomy tube was flushed with a small amount of saline and capped. A dressing was placed. The patient tolerated the procedure well without immediate postprocedural complication.  FINDINGS: Preprocedural spot fluoroscopic image demonstrates unchanged positioning of the percutaneous cholecystostomy tube overlying the right upper abdominal quadrant.  Contrast injection demonstrates appropriate positioning and functionality existing cholecystostomy tube with an coiled and locked within the mid aspect of the gallbladder. There is brisk passage of contrast through the cystic duct with filling of the CBD to the level of the duodenum.  IMPRESSION: No evidence  of choledocholithiasis.  PLAN: The patient's cholecystostomy tube capped for a trial of internalization.  Patient was given an extra gravity bag and instructed to reconnect the cholecystostomy tube to the gravity bag if he were to develop recurrent right upper quadrant abdominal pain.  Assuming the patient passes a trial of internalization, he will return to the interventional Radiology drain clinic in 1 week (2/23) for potential cholecystostomy tube removal following a discussion with referring physician, Dr. Grandville Silos.   Electronically Signed   By: Sandi Mariscal M.D.   On: 01/22/2015 12:37    Labs:  CBC:  Recent Labs  12/03/14 0801 12/04/14 0500 12/05/14 0743 12/06/14 0536  WBC 11.9* 10.1 8.6 6.7  HGB 9.2* 8.8* 9.0* 9.0*  HCT 27.5* 27.6* 27.4* 27.8*  PLT 244 256 281 317    COAGS:  Recent Labs  12/05/14 0743  INR 1.48  APTT 39*    BMP:  Recent Labs  12/03/14 0839 12/04/14 0500 12/05/14 0743  12/06/14 0536  NA 135 133* 136 134*  K 4.1 4.3 4.0 4.3  CL 108 105 110 108  CO2 20 18* 17* 20  GLUCOSE 113* 152* 96 133*  BUN 38* 40* 37* 31*  CALCIUM 8.3* 8.2* 8.2* 8.4  CREATININE 2.66* 2.68* 2.40* 2.28*  GFRNONAA 21* 21* 24* 25*  GFRAA 24* 24* 28* 29*    LIVER FUNCTION TESTS:  Recent Labs  12/03/14 0839 12/05/14 0743 12/06/14 0535 12/06/14 0536  BILITOT 0.8 0.9 0.5 0.6  AST 70* 284* 103* 105*  ALT 38 166* 122* 121*  ALKPHOS 222* 454* 361* 377*  PROT 5.9* 5.7* 5.5* 5.5*  ALBUMIN 2.4* 2.4* 2.4* 2.4*    TUMOR MARKERS: No results for input(s): AFPTM, CEA, CA199, CHROMGRNA in the last 8760 hours.  Assessment and Plan:  The cystic and common bile ducts are patent. The patient failed a trial of capping. The patient should follow up with surgery regarding the possibility of cholecystectomy.    Signed: Zalman Hull, ART A 02/05/2015, 12:14 PM   I spent a total of 10 Minutes in face to face in clinical consultation, greater than 50% of which was counseling/coordinating care for cholecystostomy tube management.

## 2015-02-14 DIAGNOSIS — T85518A Breakdown (mechanical) of other gastrointestinal prosthetic devices, implants and grafts, initial encounter: Secondary | ICD-10-CM | POA: Diagnosis not present

## 2015-02-19 DIAGNOSIS — E875 Hyperkalemia: Secondary | ICD-10-CM | POA: Diagnosis not present

## 2015-02-19 DIAGNOSIS — I872 Venous insufficiency (chronic) (peripheral): Secondary | ICD-10-CM | POA: Diagnosis not present

## 2015-02-19 DIAGNOSIS — N184 Chronic kidney disease, stage 4 (severe): Secondary | ICD-10-CM | POA: Diagnosis not present

## 2015-02-19 DIAGNOSIS — E871 Hypo-osmolality and hyponatremia: Secondary | ICD-10-CM | POA: Diagnosis not present

## 2015-02-19 DIAGNOSIS — D638 Anemia in other chronic diseases classified elsewhere: Secondary | ICD-10-CM | POA: Diagnosis not present

## 2015-02-19 DIAGNOSIS — I83023 Varicose veins of left lower extremity with ulcer of ankle: Secondary | ICD-10-CM | POA: Diagnosis not present

## 2015-02-19 DIAGNOSIS — L97322 Non-pressure chronic ulcer of left ankle with fat layer exposed: Secondary | ICD-10-CM | POA: Diagnosis not present

## 2015-02-19 DIAGNOSIS — I10 Essential (primary) hypertension: Secondary | ICD-10-CM | POA: Diagnosis not present

## 2015-02-19 DIAGNOSIS — L97329 Non-pressure chronic ulcer of left ankle with unspecified severity: Secondary | ICD-10-CM | POA: Diagnosis not present

## 2015-02-19 DIAGNOSIS — R809 Proteinuria, unspecified: Secondary | ICD-10-CM | POA: Diagnosis not present

## 2015-02-20 ENCOUNTER — Telehealth: Payer: Self-pay

## 2015-02-21 NOTE — Telephone Encounter (Signed)
Spoke with wife and she is concerned that patient is losing ability to walk despite having PT at facility.  She was asking advice as far as getting injections for knee pain.  Advised wife to speak with nursing caring for patient and let her know concerns and possibly referral for ortho.   She would still like to know opinion of MD.

## 2015-02-21 NOTE — Telephone Encounter (Signed)
I agree with your recommendations, ps let her know

## 2015-02-22 ENCOUNTER — Other Ambulatory Visit: Payer: Self-pay | Admitting: General Surgery

## 2015-02-22 DIAGNOSIS — K810 Acute cholecystitis: Secondary | ICD-10-CM | POA: Diagnosis not present

## 2015-02-22 DIAGNOSIS — K819 Cholecystitis, unspecified: Secondary | ICD-10-CM

## 2015-02-27 DIAGNOSIS — K819 Cholecystitis, unspecified: Secondary | ICD-10-CM | POA: Diagnosis not present

## 2015-03-06 DIAGNOSIS — E039 Hypothyroidism, unspecified: Secondary | ICD-10-CM | POA: Diagnosis not present

## 2015-03-06 DIAGNOSIS — E785 Hyperlipidemia, unspecified: Secondary | ICD-10-CM | POA: Diagnosis not present

## 2015-03-06 DIAGNOSIS — I739 Peripheral vascular disease, unspecified: Secondary | ICD-10-CM | POA: Diagnosis not present

## 2015-03-08 DIAGNOSIS — M6281 Muscle weakness (generalized): Secondary | ICD-10-CM | POA: Diagnosis not present

## 2015-03-08 DIAGNOSIS — E119 Type 2 diabetes mellitus without complications: Secondary | ICD-10-CM | POA: Diagnosis not present

## 2015-03-08 DIAGNOSIS — D649 Anemia, unspecified: Secondary | ICD-10-CM | POA: Diagnosis not present

## 2015-03-08 DIAGNOSIS — K810 Acute cholecystitis: Secondary | ICD-10-CM | POA: Diagnosis not present

## 2015-03-08 DIAGNOSIS — R41841 Cognitive communication deficit: Secondary | ICD-10-CM | POA: Diagnosis not present

## 2015-03-08 DIAGNOSIS — R2689 Other abnormalities of gait and mobility: Secondary | ICD-10-CM | POA: Diagnosis not present

## 2015-03-08 DIAGNOSIS — R569 Unspecified convulsions: Secondary | ICD-10-CM | POA: Diagnosis not present

## 2015-03-08 DIAGNOSIS — E039 Hypothyroidism, unspecified: Secondary | ICD-10-CM | POA: Diagnosis not present

## 2015-03-08 DIAGNOSIS — I129 Hypertensive chronic kidney disease with stage 1 through stage 4 chronic kidney disease, or unspecified chronic kidney disease: Secondary | ICD-10-CM | POA: Diagnosis not present

## 2015-03-08 DIAGNOSIS — N183 Chronic kidney disease, stage 3 (moderate): Secondary | ICD-10-CM | POA: Diagnosis not present

## 2015-03-08 DIAGNOSIS — R278 Other lack of coordination: Secondary | ICD-10-CM | POA: Diagnosis not present

## 2015-03-08 DIAGNOSIS — R293 Abnormal posture: Secondary | ICD-10-CM | POA: Diagnosis not present

## 2015-03-12 NOTE — Telephone Encounter (Signed)
Patient will be discharged from nursing facility 4/6

## 2015-03-14 DIAGNOSIS — Z48815 Encounter for surgical aftercare following surgery on the digestive system: Secondary | ICD-10-CM | POA: Diagnosis not present

## 2015-03-14 DIAGNOSIS — E119 Type 2 diabetes mellitus without complications: Secondary | ICD-10-CM | POA: Diagnosis not present

## 2015-03-14 DIAGNOSIS — I87312 Chronic venous hypertension (idiopathic) with ulcer of left lower extremity: Secondary | ICD-10-CM | POA: Diagnosis not present

## 2015-03-14 DIAGNOSIS — N189 Chronic kidney disease, unspecified: Secondary | ICD-10-CM | POA: Diagnosis not present

## 2015-03-14 DIAGNOSIS — I129 Hypertensive chronic kidney disease with stage 1 through stage 4 chronic kidney disease, or unspecified chronic kidney disease: Secondary | ICD-10-CM | POA: Diagnosis not present

## 2015-03-14 DIAGNOSIS — E785 Hyperlipidemia, unspecified: Secondary | ICD-10-CM | POA: Diagnosis not present

## 2015-03-14 DIAGNOSIS — L97321 Non-pressure chronic ulcer of left ankle limited to breakdown of skin: Secondary | ICD-10-CM | POA: Diagnosis not present

## 2015-03-14 DIAGNOSIS — E039 Hypothyroidism, unspecified: Secondary | ICD-10-CM | POA: Diagnosis not present

## 2015-03-14 DIAGNOSIS — Z794 Long term (current) use of insulin: Secondary | ICD-10-CM | POA: Diagnosis not present

## 2015-03-15 DIAGNOSIS — I129 Hypertensive chronic kidney disease with stage 1 through stage 4 chronic kidney disease, or unspecified chronic kidney disease: Secondary | ICD-10-CM | POA: Diagnosis not present

## 2015-03-15 DIAGNOSIS — I87312 Chronic venous hypertension (idiopathic) with ulcer of left lower extremity: Secondary | ICD-10-CM | POA: Diagnosis not present

## 2015-03-15 DIAGNOSIS — E119 Type 2 diabetes mellitus without complications: Secondary | ICD-10-CM | POA: Diagnosis not present

## 2015-03-15 DIAGNOSIS — L97321 Non-pressure chronic ulcer of left ankle limited to breakdown of skin: Secondary | ICD-10-CM | POA: Diagnosis not present

## 2015-03-15 DIAGNOSIS — Z48815 Encounter for surgical aftercare following surgery on the digestive system: Secondary | ICD-10-CM | POA: Diagnosis not present

## 2015-03-15 DIAGNOSIS — N189 Chronic kidney disease, unspecified: Secondary | ICD-10-CM | POA: Diagnosis not present

## 2015-03-18 DIAGNOSIS — E119 Type 2 diabetes mellitus without complications: Secondary | ICD-10-CM | POA: Diagnosis not present

## 2015-03-18 DIAGNOSIS — Z48815 Encounter for surgical aftercare following surgery on the digestive system: Secondary | ICD-10-CM | POA: Diagnosis not present

## 2015-03-18 DIAGNOSIS — N189 Chronic kidney disease, unspecified: Secondary | ICD-10-CM | POA: Diagnosis not present

## 2015-03-18 DIAGNOSIS — I129 Hypertensive chronic kidney disease with stage 1 through stage 4 chronic kidney disease, or unspecified chronic kidney disease: Secondary | ICD-10-CM | POA: Diagnosis not present

## 2015-03-18 DIAGNOSIS — I87312 Chronic venous hypertension (idiopathic) with ulcer of left lower extremity: Secondary | ICD-10-CM | POA: Diagnosis not present

## 2015-03-18 DIAGNOSIS — L97321 Non-pressure chronic ulcer of left ankle limited to breakdown of skin: Secondary | ICD-10-CM | POA: Diagnosis not present

## 2015-03-19 ENCOUNTER — Inpatient Hospital Stay (HOSPITAL_COMMUNITY): Admission: RE | Admit: 2015-03-19 | Payer: Medicare Other | Source: Ambulatory Visit

## 2015-03-19 DIAGNOSIS — I83023 Varicose veins of left lower extremity with ulcer of ankle: Secondary | ICD-10-CM | POA: Diagnosis not present

## 2015-03-19 DIAGNOSIS — I872 Venous insufficiency (chronic) (peripheral): Secondary | ICD-10-CM | POA: Diagnosis not present

## 2015-03-19 DIAGNOSIS — L97329 Non-pressure chronic ulcer of left ankle with unspecified severity: Secondary | ICD-10-CM | POA: Diagnosis not present

## 2015-03-19 DIAGNOSIS — L97322 Non-pressure chronic ulcer of left ankle with fat layer exposed: Secondary | ICD-10-CM | POA: Diagnosis not present

## 2015-03-20 DIAGNOSIS — I129 Hypertensive chronic kidney disease with stage 1 through stage 4 chronic kidney disease, or unspecified chronic kidney disease: Secondary | ICD-10-CM | POA: Diagnosis not present

## 2015-03-20 DIAGNOSIS — E119 Type 2 diabetes mellitus without complications: Secondary | ICD-10-CM | POA: Diagnosis not present

## 2015-03-20 DIAGNOSIS — N189 Chronic kidney disease, unspecified: Secondary | ICD-10-CM | POA: Diagnosis not present

## 2015-03-20 DIAGNOSIS — I87312 Chronic venous hypertension (idiopathic) with ulcer of left lower extremity: Secondary | ICD-10-CM | POA: Diagnosis not present

## 2015-03-20 DIAGNOSIS — Z48815 Encounter for surgical aftercare following surgery on the digestive system: Secondary | ICD-10-CM | POA: Diagnosis not present

## 2015-03-20 DIAGNOSIS — L97321 Non-pressure chronic ulcer of left ankle limited to breakdown of skin: Secondary | ICD-10-CM | POA: Diagnosis not present

## 2015-03-21 DIAGNOSIS — N189 Chronic kidney disease, unspecified: Secondary | ICD-10-CM | POA: Diagnosis not present

## 2015-03-21 DIAGNOSIS — I87312 Chronic venous hypertension (idiopathic) with ulcer of left lower extremity: Secondary | ICD-10-CM | POA: Diagnosis not present

## 2015-03-21 DIAGNOSIS — I129 Hypertensive chronic kidney disease with stage 1 through stage 4 chronic kidney disease, or unspecified chronic kidney disease: Secondary | ICD-10-CM | POA: Diagnosis not present

## 2015-03-21 DIAGNOSIS — Z48815 Encounter for surgical aftercare following surgery on the digestive system: Secondary | ICD-10-CM | POA: Diagnosis not present

## 2015-03-21 DIAGNOSIS — L97321 Non-pressure chronic ulcer of left ankle limited to breakdown of skin: Secondary | ICD-10-CM | POA: Diagnosis not present

## 2015-03-21 DIAGNOSIS — E119 Type 2 diabetes mellitus without complications: Secondary | ICD-10-CM | POA: Diagnosis not present

## 2015-03-22 DIAGNOSIS — Z48815 Encounter for surgical aftercare following surgery on the digestive system: Secondary | ICD-10-CM | POA: Diagnosis not present

## 2015-03-22 DIAGNOSIS — N189 Chronic kidney disease, unspecified: Secondary | ICD-10-CM | POA: Diagnosis not present

## 2015-03-22 DIAGNOSIS — E119 Type 2 diabetes mellitus without complications: Secondary | ICD-10-CM | POA: Diagnosis not present

## 2015-03-22 DIAGNOSIS — I87312 Chronic venous hypertension (idiopathic) with ulcer of left lower extremity: Secondary | ICD-10-CM | POA: Diagnosis not present

## 2015-03-22 DIAGNOSIS — L97321 Non-pressure chronic ulcer of left ankle limited to breakdown of skin: Secondary | ICD-10-CM | POA: Diagnosis not present

## 2015-03-22 DIAGNOSIS — I129 Hypertensive chronic kidney disease with stage 1 through stage 4 chronic kidney disease, or unspecified chronic kidney disease: Secondary | ICD-10-CM | POA: Diagnosis not present

## 2015-03-25 DIAGNOSIS — I129 Hypertensive chronic kidney disease with stage 1 through stage 4 chronic kidney disease, or unspecified chronic kidney disease: Secondary | ICD-10-CM | POA: Diagnosis not present

## 2015-03-25 DIAGNOSIS — L97321 Non-pressure chronic ulcer of left ankle limited to breakdown of skin: Secondary | ICD-10-CM | POA: Diagnosis not present

## 2015-03-25 DIAGNOSIS — I87312 Chronic venous hypertension (idiopathic) with ulcer of left lower extremity: Secondary | ICD-10-CM | POA: Diagnosis not present

## 2015-03-25 DIAGNOSIS — Z48815 Encounter for surgical aftercare following surgery on the digestive system: Secondary | ICD-10-CM | POA: Diagnosis not present

## 2015-03-25 DIAGNOSIS — N189 Chronic kidney disease, unspecified: Secondary | ICD-10-CM | POA: Diagnosis not present

## 2015-03-25 DIAGNOSIS — E119 Type 2 diabetes mellitus without complications: Secondary | ICD-10-CM | POA: Diagnosis not present

## 2015-03-26 DIAGNOSIS — E119 Type 2 diabetes mellitus without complications: Secondary | ICD-10-CM | POA: Diagnosis not present

## 2015-03-26 DIAGNOSIS — I87312 Chronic venous hypertension (idiopathic) with ulcer of left lower extremity: Secondary | ICD-10-CM | POA: Diagnosis not present

## 2015-03-26 DIAGNOSIS — Z48815 Encounter for surgical aftercare following surgery on the digestive system: Secondary | ICD-10-CM | POA: Diagnosis not present

## 2015-03-26 DIAGNOSIS — I129 Hypertensive chronic kidney disease with stage 1 through stage 4 chronic kidney disease, or unspecified chronic kidney disease: Secondary | ICD-10-CM | POA: Diagnosis not present

## 2015-03-26 DIAGNOSIS — L97321 Non-pressure chronic ulcer of left ankle limited to breakdown of skin: Secondary | ICD-10-CM | POA: Diagnosis not present

## 2015-03-26 DIAGNOSIS — N189 Chronic kidney disease, unspecified: Secondary | ICD-10-CM | POA: Diagnosis not present

## 2015-03-27 DIAGNOSIS — Z48815 Encounter for surgical aftercare following surgery on the digestive system: Secondary | ICD-10-CM | POA: Diagnosis not present

## 2015-03-27 DIAGNOSIS — E119 Type 2 diabetes mellitus without complications: Secondary | ICD-10-CM | POA: Diagnosis not present

## 2015-03-27 DIAGNOSIS — N189 Chronic kidney disease, unspecified: Secondary | ICD-10-CM | POA: Diagnosis not present

## 2015-03-27 DIAGNOSIS — I129 Hypertensive chronic kidney disease with stage 1 through stage 4 chronic kidney disease, or unspecified chronic kidney disease: Secondary | ICD-10-CM | POA: Diagnosis not present

## 2015-03-27 DIAGNOSIS — L97321 Non-pressure chronic ulcer of left ankle limited to breakdown of skin: Secondary | ICD-10-CM | POA: Diagnosis not present

## 2015-03-27 DIAGNOSIS — I87312 Chronic venous hypertension (idiopathic) with ulcer of left lower extremity: Secondary | ICD-10-CM | POA: Diagnosis not present

## 2015-03-28 ENCOUNTER — Telehealth: Payer: Self-pay

## 2015-03-28 DIAGNOSIS — E119 Type 2 diabetes mellitus without complications: Secondary | ICD-10-CM | POA: Diagnosis not present

## 2015-03-28 DIAGNOSIS — I129 Hypertensive chronic kidney disease with stage 1 through stage 4 chronic kidney disease, or unspecified chronic kidney disease: Secondary | ICD-10-CM | POA: Diagnosis not present

## 2015-03-28 DIAGNOSIS — Z48815 Encounter for surgical aftercare following surgery on the digestive system: Secondary | ICD-10-CM | POA: Diagnosis not present

## 2015-03-28 DIAGNOSIS — N189 Chronic kidney disease, unspecified: Secondary | ICD-10-CM | POA: Diagnosis not present

## 2015-03-28 DIAGNOSIS — L97321 Non-pressure chronic ulcer of left ankle limited to breakdown of skin: Secondary | ICD-10-CM | POA: Diagnosis not present

## 2015-03-28 DIAGNOSIS — I87312 Chronic venous hypertension (idiopathic) with ulcer of left lower extremity: Secondary | ICD-10-CM | POA: Diagnosis not present

## 2015-03-28 NOTE — Telephone Encounter (Signed)
verbal ok given to do OT for 2x/week for the next month

## 2015-03-29 DIAGNOSIS — I87312 Chronic venous hypertension (idiopathic) with ulcer of left lower extremity: Secondary | ICD-10-CM | POA: Diagnosis not present

## 2015-03-29 DIAGNOSIS — I129 Hypertensive chronic kidney disease with stage 1 through stage 4 chronic kidney disease, or unspecified chronic kidney disease: Secondary | ICD-10-CM | POA: Diagnosis not present

## 2015-03-29 DIAGNOSIS — E119 Type 2 diabetes mellitus without complications: Secondary | ICD-10-CM | POA: Diagnosis not present

## 2015-03-29 DIAGNOSIS — L97321 Non-pressure chronic ulcer of left ankle limited to breakdown of skin: Secondary | ICD-10-CM | POA: Diagnosis not present

## 2015-03-29 DIAGNOSIS — N189 Chronic kidney disease, unspecified: Secondary | ICD-10-CM | POA: Diagnosis not present

## 2015-03-29 DIAGNOSIS — Z48815 Encounter for surgical aftercare following surgery on the digestive system: Secondary | ICD-10-CM | POA: Diagnosis not present

## 2015-04-01 ENCOUNTER — Other Ambulatory Visit: Payer: Self-pay | Admitting: Family Medicine

## 2015-04-01 DIAGNOSIS — E1342 Other specified diabetes mellitus with diabetic polyneuropathy: Secondary | ICD-10-CM | POA: Diagnosis not present

## 2015-04-01 DIAGNOSIS — R600 Localized edema: Secondary | ICD-10-CM | POA: Diagnosis not present

## 2015-04-01 DIAGNOSIS — N189 Chronic kidney disease, unspecified: Secondary | ICD-10-CM | POA: Diagnosis not present

## 2015-04-01 DIAGNOSIS — I87312 Chronic venous hypertension (idiopathic) with ulcer of left lower extremity: Secondary | ICD-10-CM | POA: Diagnosis not present

## 2015-04-01 DIAGNOSIS — Z48815 Encounter for surgical aftercare following surgery on the digestive system: Secondary | ICD-10-CM | POA: Diagnosis not present

## 2015-04-01 DIAGNOSIS — I129 Hypertensive chronic kidney disease with stage 1 through stage 4 chronic kidney disease, or unspecified chronic kidney disease: Secondary | ICD-10-CM | POA: Diagnosis not present

## 2015-04-01 DIAGNOSIS — L609 Nail disorder, unspecified: Secondary | ICD-10-CM | POA: Diagnosis not present

## 2015-04-01 DIAGNOSIS — B351 Tinea unguium: Secondary | ICD-10-CM | POA: Diagnosis not present

## 2015-04-01 DIAGNOSIS — L97321 Non-pressure chronic ulcer of left ankle limited to breakdown of skin: Secondary | ICD-10-CM | POA: Diagnosis not present

## 2015-04-01 DIAGNOSIS — E119 Type 2 diabetes mellitus without complications: Secondary | ICD-10-CM | POA: Diagnosis not present

## 2015-04-02 ENCOUNTER — Other Ambulatory Visit: Payer: Self-pay | Admitting: Family Medicine

## 2015-04-02 ENCOUNTER — Ambulatory Visit (INDEPENDENT_AMBULATORY_CARE_PROVIDER_SITE_OTHER): Payer: Medicare Other | Admitting: Family Medicine

## 2015-04-02 ENCOUNTER — Encounter: Payer: Self-pay | Admitting: Family Medicine

## 2015-04-02 VITALS — BP 136/60 | HR 86 | Resp 18 | Ht 68.0 in | Wt 181.1 lb

## 2015-04-02 DIAGNOSIS — I129 Hypertensive chronic kidney disease with stage 1 through stage 4 chronic kidney disease, or unspecified chronic kidney disease: Secondary | ICD-10-CM | POA: Diagnosis not present

## 2015-04-02 DIAGNOSIS — E1140 Type 2 diabetes mellitus with diabetic neuropathy, unspecified: Secondary | ICD-10-CM

## 2015-04-02 DIAGNOSIS — L97321 Non-pressure chronic ulcer of left ankle limited to breakdown of skin: Secondary | ICD-10-CM | POA: Diagnosis not present

## 2015-04-02 DIAGNOSIS — I119 Hypertensive heart disease without heart failure: Secondary | ICD-10-CM

## 2015-04-02 DIAGNOSIS — N189 Chronic kidney disease, unspecified: Secondary | ICD-10-CM | POA: Diagnosis not present

## 2015-04-02 DIAGNOSIS — Z09 Encounter for follow-up examination after completed treatment for conditions other than malignant neoplasm: Secondary | ICD-10-CM

## 2015-04-02 DIAGNOSIS — I150 Renovascular hypertension: Secondary | ICD-10-CM

## 2015-04-02 DIAGNOSIS — M25562 Pain in left knee: Secondary | ICD-10-CM

## 2015-04-02 DIAGNOSIS — E1142 Type 2 diabetes mellitus with diabetic polyneuropathy: Secondary | ICD-10-CM

## 2015-04-02 DIAGNOSIS — E038 Other specified hypothyroidism: Secondary | ICD-10-CM

## 2015-04-02 DIAGNOSIS — E785 Hyperlipidemia, unspecified: Secondary | ICD-10-CM | POA: Diagnosis not present

## 2015-04-02 DIAGNOSIS — E119 Type 2 diabetes mellitus without complications: Secondary | ICD-10-CM | POA: Diagnosis not present

## 2015-04-02 DIAGNOSIS — E559 Vitamin D deficiency, unspecified: Secondary | ICD-10-CM

## 2015-04-02 DIAGNOSIS — D649 Anemia, unspecified: Secondary | ICD-10-CM | POA: Diagnosis not present

## 2015-04-02 DIAGNOSIS — I87312 Chronic venous hypertension (idiopathic) with ulcer of left lower extremity: Secondary | ICD-10-CM | POA: Diagnosis not present

## 2015-04-02 DIAGNOSIS — I739 Peripheral vascular disease, unspecified: Secondary | ICD-10-CM

## 2015-04-02 DIAGNOSIS — Z48815 Encounter for surgical aftercare following surgery on the digestive system: Secondary | ICD-10-CM | POA: Diagnosis not present

## 2015-04-02 DIAGNOSIS — K8000 Calculus of gallbladder with acute cholecystitis without obstruction: Secondary | ICD-10-CM

## 2015-04-02 DIAGNOSIS — G40209 Localization-related (focal) (partial) symptomatic epilepsy and epileptic syndromes with complex partial seizures, not intractable, without status epilepticus: Secondary | ICD-10-CM

## 2015-04-02 NOTE — Patient Instructions (Addendum)
Annual wellness in 3 month, call if you need me before  Labs today  You are referred to endo and also  for eye exam  You will be referred to ortho in [place] re left knee pain  Work with in home PT so you can continue to get stronger   Thankful you are much improved  Thanks for choosing [facility], we consider it a privelige to serve you.

## 2015-04-02 NOTE — Progress Notes (Signed)
Subjective:    Patient ID: Chase [name], male    DOB: Jan 20, 1931, 81 y.o.   MRN: [id number]  HPI Pt in for hospital follow up , followed by in patient rehab at facility in [place] He was evaluated with acute cholecystitis and managed conservatively, following this he was in a SNF for several weeks for strengthening , and indeed he both feels and ooks much stronger than when last seen over past several months   Review of Systems See HPI Denies recent fever or chills. Denies sinus pressure, nasal congestion, ear pain or sore throat. Denies chest congestion, productive cough or wheezing. Denies chest pains, palpitations and leg swelling Denies abdominal pain, nausea, vomiting,diarrhea or constipation.   Denies dysuria, frequency, hesitancy or incontinence. Chronic joint pain, swelling and limitation in mobility. Denies headaches, seizures, chronic  numbness, and  Tingling.in hands and feet Denies depression, anxiety or insomnia. Chronic dry skin       Objective:   Physical Exam  BP 136/60 mmHg  Pulse 86  Resp 18  Ht 5\' 8"  (1.727 m)  Wt 181 lb 1.9 oz (82.155 kg)  BMI 27.55 kg/m2  SpO2 98% Patient alert and oriented and in no cardiopulmonary distress.  HEENT: No facial asymmetry, EOMI,   oropharynx pink and moist.  Neck supple no JVD, no mass.  Chest: Clear to auscultation bilaterally.  CVS: S1, S2 no murmurs, no S3.Regular rate.  ABD: Soft non tender.   Ext: No edema  MS: decreased though adequate  ROM spine, shoulders, hips and knees.  Skin: Intact, no ulcerations or rash noted.  Psych: Good eye contact, normal affect. Memory intact not anxious or depressed appearing.  CNS: CN 2-12 intact, power,  normal throughout.no focal deficits noted.       Assessment & Plan:  Renovascular hypertension Controlled, no change in medication DASH diet and commitment to daily physical activity for a minimum of 30 minutes discussed and encouraged, as a part of hypertension  management. The importance of attaining a healthy weight is also discussed.  BP/Weight 04/02/2015 12/06/2014 12/01/2014 11/16/2014 10/16/2014 123XX123 [id number]  Systolic BP XX123456 Q000111Q - 123456 0000000 123XX123 0000000  Diastolic BP 60 68 - 68 71 78 70  Wt. (Lbs) 181.12 - 194.01 190.5 187 190.4 191.8  BMI 27.55 - 29.51 28.97 28.44 28.96 29.17        Peripheral vascular disease No current lower extremity skin breakdown or ulcer , followed by Dr Nils Pyle  Calculus of gallbladder with acute cholecystitis without obstruction Managed successfully by conservative measures  Complex partial seizure disorder Maintained on kepra, no further recurrences once compliant with medication , approx 3 years, followed by neurology  Hyperlipidemia LDL goal <100 Hyperlipidemia:Low fat diet discussed and encouraged.   Lipid Panel  Lab Results  Component Value Date   CHOL 142 04/02/2015   HDL 72 04/02/2015   LDLCALC 55 04/02/2015   TRIG 77 04/02/2015   CHOLHDL 2.0 04/02/2015   Controlled, no change in medication      Type 2 diabetes mellitus with peripheral neuropathy Well controlled, needs to re establish with endo, hopefully he will maintain good diet once at home Patient educated about the importance of limiting  Carbohydrate intake , the need to commit to daily physical activity for a minimum of 30 minutes , and to commit weight loss. The fact that changes in all these areas will reduce or eliminate all together the development of diabetes is stressed.   Diabetic Labs Latest Ref Rng 04/02/2015 12/06/2014  12/05/2014 12/04/2014 12/03/2014  HbA1c <5.7 % 6.7(H) - - - -  Microalbumin 0.00 - 1.89 mg/dL - - - - -  Micro/Creat Ratio 0.0 - 30.0 mg/g - - - - -  Chol 0 - 200 mg/dL 142 - - - -  HDL >=40 mg/dL 72 - - - -  Calc LDL 0 - 99 mg/dL 55 - - - -  Triglycerides <150 mg/dL 77 - - - -  Creatinine 0.50 - 1.35 mg/dL 2.18(H) 2.28(H) 2.40(H) 2.68(H) 2.66(H)   BP/Weight 04/02/2015 12/06/2014 12/01/2014  11/16/2014 10/16/2014 [id number]  Systolic BP XX123456 Q000111Q - 123456 0000000 123XX123 0000000  Diastolic BP 60 68 - 68 71 78 70  Wt. (Lbs) 181.12 - 194.01 190.5 187 190.4 191.8  BMI 27.55 - 29.51 28.97 28.44 28.96 29.17   Foot/eye exam completion dates Latest Ref Rng 06/12/2014 03/26/2014  Eye Exam No Retinopathy - Retinopathy(A)  Foot exam Order - - -  Foot Form Completion - [facility] discharge follow-up Admitted from 12/25 to 12/05/2014 with acute cholecystitis, then transferred to SNF for over 2 months rehab, im to see more for first time , and doing extremely well  Hypothyroidism Over corrected med adjustment needed, with rept lab in 6 to 8 weeks

## 2015-04-03 DIAGNOSIS — I87312 Chronic venous hypertension (idiopathic) with ulcer of left lower extremity: Secondary | ICD-10-CM | POA: Diagnosis not present

## 2015-04-03 DIAGNOSIS — I129 Hypertensive chronic kidney disease with stage 1 through stage 4 chronic kidney disease, or unspecified chronic kidney disease: Secondary | ICD-10-CM | POA: Diagnosis not present

## 2015-04-03 DIAGNOSIS — N189 Chronic kidney disease, unspecified: Secondary | ICD-10-CM | POA: Diagnosis not present

## 2015-04-03 DIAGNOSIS — Z48815 Encounter for surgical aftercare following surgery on the digestive system: Secondary | ICD-10-CM | POA: Diagnosis not present

## 2015-04-03 DIAGNOSIS — L97321 Non-pressure chronic ulcer of left ankle limited to breakdown of skin: Secondary | ICD-10-CM | POA: Diagnosis not present

## 2015-04-03 DIAGNOSIS — E119 Type 2 diabetes mellitus without complications: Secondary | ICD-10-CM | POA: Diagnosis not present

## 2015-04-03 LAB — COMPLETE METABOLIC PANEL WITH GFR
ALT: 17 U/L (ref 0–53)
AST: 20 U/L (ref 0–37)
Albumin: 3.8 g/dL (ref 3.5–5.2)
Alkaline Phosphatase: 96 U/L (ref 39–117)
BUN: 37 mg/dL — ABNORMAL HIGH (ref 6–23)
CO2: 22 mEq/L (ref 19–32)
Calcium: 9.3 mg/dL (ref 8.4–10.5)
Chloride: 106 mEq/L (ref 96–112)
Creat: 2.18 mg/dL — ABNORMAL HIGH (ref 0.50–1.35)
GFR, Est African American: 32 mL/min — ABNORMAL LOW
GFR, Est Non African American: 27 mL/min — ABNORMAL LOW
Glucose, Bld: 166 mg/dL — ABNORMAL HIGH (ref 70–99)
Potassium: 4.7 mEq/L (ref 3.5–5.3)
Sodium: 137 mEq/L (ref 135–145)
Total Bilirubin: 0.3 mg/dL (ref 0.2–1.2)
Total Protein: 7 g/dL (ref 6.0–8.3)

## 2015-04-03 LAB — CBC
HCT: 27.4 % — ABNORMAL LOW (ref 39.0–52.0)
Hemoglobin: 8.9 g/dL — ABNORMAL LOW (ref 13.0–17.0)
MCH: 30.3 pg (ref 26.0–34.0)
MCHC: 32.5 g/dL (ref 30.0–36.0)
MCV: 93.2 fL (ref 78.0–100.0)
MPV: 9.6 fL (ref 8.6–12.4)
Platelets: 282 10*3/uL (ref 150–400)
RBC: 2.94 MIL/uL — ABNORMAL LOW (ref 4.22–5.81)
RDW: 13.7 % (ref 11.5–15.5)
WBC: 7.1 10*3/uL (ref 4.0–10.5)

## 2015-04-03 LAB — LIPID PANEL
Cholesterol: 142 mg/dL (ref 0–200)
HDL: 72 mg/dL (ref >=40)
LDL Cholesterol: 55 mg/dL (ref 0–99)
Total CHOL/HDL Ratio: 2 Ratio
Triglycerides: 77 mg/dL (ref <150)
VLDL: 15 mg/dL (ref 0–40)

## 2015-04-03 LAB — VITAMIN D 25 HYDROXY (VIT D DEFICIENCY, FRACTURES): Vit D, 25-Hydroxy: 31 ng/mL (ref 30–100)

## 2015-04-03 LAB — VITAMIN B12: Vitamin B-12: 704 pg/mL (ref 211–911)

## 2015-04-03 LAB — TSH: TSH: 0.323 u[IU]/mL — ABNORMAL LOW (ref 0.350–4.500)

## 2015-04-03 LAB — IRON: Iron: 63 ug/dL (ref 42–165)

## 2015-04-03 LAB — FERRITIN: Ferritin: 442 ng/mL — ABNORMAL HIGH (ref 22–322)

## 2015-04-04 ENCOUNTER — Other Ambulatory Visit: Payer: Self-pay | Admitting: Family Medicine

## 2015-04-04 DIAGNOSIS — D631 Anemia in chronic kidney disease: Secondary | ICD-10-CM

## 2015-04-04 DIAGNOSIS — N183 Chronic kidney disease, stage 3 unspecified: Secondary | ICD-10-CM

## 2015-04-04 DIAGNOSIS — N189 Chronic kidney disease, unspecified: Secondary | ICD-10-CM

## 2015-04-04 DIAGNOSIS — E038 Other specified hypothyroidism: Secondary | ICD-10-CM

## 2015-04-04 DIAGNOSIS — Z794 Long term (current) use of insulin: Principal | ICD-10-CM

## 2015-04-04 DIAGNOSIS — IMO0001 Reserved for inherently not codable concepts without codable children: Secondary | ICD-10-CM

## 2015-04-04 DIAGNOSIS — E119 Type 2 diabetes mellitus without complications: Principal | ICD-10-CM

## 2015-04-04 LAB — HEMOGLOBIN A1C
Hgb A1c MFr Bld: 6.7 % — ABNORMAL HIGH (ref <5.7)
Mean Plasma Glucose: 146 mg/dL — ABNORMAL HIGH (ref <117)

## 2015-04-05 DIAGNOSIS — I129 Hypertensive chronic kidney disease with stage 1 through stage 4 chronic kidney disease, or unspecified chronic kidney disease: Secondary | ICD-10-CM | POA: Diagnosis not present

## 2015-04-05 DIAGNOSIS — E119 Type 2 diabetes mellitus without complications: Secondary | ICD-10-CM | POA: Diagnosis not present

## 2015-04-05 DIAGNOSIS — N189 Chronic kidney disease, unspecified: Secondary | ICD-10-CM | POA: Diagnosis not present

## 2015-04-05 DIAGNOSIS — I87312 Chronic venous hypertension (idiopathic) with ulcer of left lower extremity: Secondary | ICD-10-CM | POA: Diagnosis not present

## 2015-04-05 DIAGNOSIS — L97321 Non-pressure chronic ulcer of left ankle limited to breakdown of skin: Secondary | ICD-10-CM | POA: Diagnosis not present

## 2015-04-05 DIAGNOSIS — Z48815 Encounter for surgical aftercare following surgery on the digestive system: Secondary | ICD-10-CM | POA: Diagnosis not present

## 2015-04-08 DIAGNOSIS — I87312 Chronic venous hypertension (idiopathic) with ulcer of left lower extremity: Secondary | ICD-10-CM | POA: Diagnosis not present

## 2015-04-08 DIAGNOSIS — Z48815 Encounter for surgical aftercare following surgery on the digestive system: Secondary | ICD-10-CM | POA: Diagnosis not present

## 2015-04-08 DIAGNOSIS — I129 Hypertensive chronic kidney disease with stage 1 through stage 4 chronic kidney disease, or unspecified chronic kidney disease: Secondary | ICD-10-CM | POA: Diagnosis not present

## 2015-04-08 DIAGNOSIS — E119 Type 2 diabetes mellitus without complications: Secondary | ICD-10-CM | POA: Diagnosis not present

## 2015-04-08 DIAGNOSIS — L97321 Non-pressure chronic ulcer of left ankle limited to breakdown of skin: Secondary | ICD-10-CM | POA: Diagnosis not present

## 2015-04-08 DIAGNOSIS — N189 Chronic kidney disease, unspecified: Secondary | ICD-10-CM | POA: Diagnosis not present

## 2015-04-09 DIAGNOSIS — E119 Type 2 diabetes mellitus without complications: Secondary | ICD-10-CM | POA: Diagnosis not present

## 2015-04-09 DIAGNOSIS — L97321 Non-pressure chronic ulcer of left ankle limited to breakdown of skin: Secondary | ICD-10-CM | POA: Diagnosis not present

## 2015-04-09 DIAGNOSIS — I129 Hypertensive chronic kidney disease with stage 1 through stage 4 chronic kidney disease, or unspecified chronic kidney disease: Secondary | ICD-10-CM | POA: Diagnosis not present

## 2015-04-09 DIAGNOSIS — N189 Chronic kidney disease, unspecified: Secondary | ICD-10-CM | POA: Diagnosis not present

## 2015-04-09 DIAGNOSIS — I87312 Chronic venous hypertension (idiopathic) with ulcer of left lower extremity: Secondary | ICD-10-CM | POA: Diagnosis not present

## 2015-04-09 DIAGNOSIS — Z48815 Encounter for surgical aftercare following surgery on the digestive system: Secondary | ICD-10-CM | POA: Diagnosis not present

## 2015-04-09 DIAGNOSIS — M1712 Unilateral primary osteoarthritis, left knee: Secondary | ICD-10-CM | POA: Diagnosis not present

## 2015-04-10 DIAGNOSIS — L97321 Non-pressure chronic ulcer of left ankle limited to breakdown of skin: Secondary | ICD-10-CM | POA: Diagnosis not present

## 2015-04-10 DIAGNOSIS — I129 Hypertensive chronic kidney disease with stage 1 through stage 4 chronic kidney disease, or unspecified chronic kidney disease: Secondary | ICD-10-CM | POA: Diagnosis not present

## 2015-04-10 DIAGNOSIS — E119 Type 2 diabetes mellitus without complications: Secondary | ICD-10-CM | POA: Diagnosis not present

## 2015-04-10 DIAGNOSIS — I87312 Chronic venous hypertension (idiopathic) with ulcer of left lower extremity: Secondary | ICD-10-CM | POA: Diagnosis not present

## 2015-04-10 DIAGNOSIS — Z48815 Encounter for surgical aftercare following surgery on the digestive system: Secondary | ICD-10-CM | POA: Diagnosis not present

## 2015-04-10 DIAGNOSIS — N189 Chronic kidney disease, unspecified: Secondary | ICD-10-CM | POA: Diagnosis not present

## 2015-04-11 DIAGNOSIS — N189 Chronic kidney disease, unspecified: Secondary | ICD-10-CM | POA: Diagnosis not present

## 2015-04-11 DIAGNOSIS — E119 Type 2 diabetes mellitus without complications: Secondary | ICD-10-CM | POA: Diagnosis not present

## 2015-04-11 DIAGNOSIS — Z48815 Encounter for surgical aftercare following surgery on the digestive system: Secondary | ICD-10-CM | POA: Diagnosis not present

## 2015-04-11 DIAGNOSIS — I129 Hypertensive chronic kidney disease with stage 1 through stage 4 chronic kidney disease, or unspecified chronic kidney disease: Secondary | ICD-10-CM | POA: Diagnosis not present

## 2015-04-11 DIAGNOSIS — L97321 Non-pressure chronic ulcer of left ankle limited to breakdown of skin: Secondary | ICD-10-CM | POA: Diagnosis not present

## 2015-04-11 DIAGNOSIS — I87312 Chronic venous hypertension (idiopathic) with ulcer of left lower extremity: Secondary | ICD-10-CM | POA: Diagnosis not present

## 2015-04-12 DIAGNOSIS — Z48815 Encounter for surgical aftercare following surgery on the digestive system: Secondary | ICD-10-CM | POA: Diagnosis not present

## 2015-04-12 DIAGNOSIS — N189 Chronic kidney disease, unspecified: Secondary | ICD-10-CM | POA: Diagnosis not present

## 2015-04-12 DIAGNOSIS — L97321 Non-pressure chronic ulcer of left ankle limited to breakdown of skin: Secondary | ICD-10-CM | POA: Diagnosis not present

## 2015-04-12 DIAGNOSIS — I87312 Chronic venous hypertension (idiopathic) with ulcer of left lower extremity: Secondary | ICD-10-CM | POA: Diagnosis not present

## 2015-04-12 DIAGNOSIS — E119 Type 2 diabetes mellitus without complications: Secondary | ICD-10-CM | POA: Diagnosis not present

## 2015-04-12 DIAGNOSIS — I129 Hypertensive chronic kidney disease with stage 1 through stage 4 chronic kidney disease, or unspecified chronic kidney disease: Secondary | ICD-10-CM | POA: Diagnosis not present

## 2015-04-15 DIAGNOSIS — N189 Chronic kidney disease, unspecified: Secondary | ICD-10-CM | POA: Diagnosis not present

## 2015-04-15 DIAGNOSIS — E119 Type 2 diabetes mellitus without complications: Secondary | ICD-10-CM | POA: Diagnosis not present

## 2015-04-15 DIAGNOSIS — I87312 Chronic venous hypertension (idiopathic) with ulcer of left lower extremity: Secondary | ICD-10-CM | POA: Diagnosis not present

## 2015-04-15 DIAGNOSIS — I129 Hypertensive chronic kidney disease with stage 1 through stage 4 chronic kidney disease, or unspecified chronic kidney disease: Secondary | ICD-10-CM | POA: Diagnosis not present

## 2015-04-15 DIAGNOSIS — L97321 Non-pressure chronic ulcer of left ankle limited to breakdown of skin: Secondary | ICD-10-CM | POA: Diagnosis not present

## 2015-04-15 DIAGNOSIS — Z48815 Encounter for surgical aftercare following surgery on the digestive system: Secondary | ICD-10-CM | POA: Diagnosis not present

## 2015-04-16 DIAGNOSIS — I87312 Chronic venous hypertension (idiopathic) with ulcer of left lower extremity: Secondary | ICD-10-CM | POA: Diagnosis not present

## 2015-04-16 DIAGNOSIS — N189 Chronic kidney disease, unspecified: Secondary | ICD-10-CM | POA: Diagnosis not present

## 2015-04-16 DIAGNOSIS — E039 Hypothyroidism, unspecified: Secondary | ICD-10-CM | POA: Diagnosis not present

## 2015-04-16 DIAGNOSIS — L97321 Non-pressure chronic ulcer of left ankle limited to breakdown of skin: Secondary | ICD-10-CM | POA: Diagnosis not present

## 2015-04-16 DIAGNOSIS — E119 Type 2 diabetes mellitus without complications: Secondary | ICD-10-CM | POA: Diagnosis not present

## 2015-04-16 DIAGNOSIS — I129 Hypertensive chronic kidney disease with stage 1 through stage 4 chronic kidney disease, or unspecified chronic kidney disease: Secondary | ICD-10-CM | POA: Diagnosis not present

## 2015-04-16 DIAGNOSIS — Z48815 Encounter for surgical aftercare following surgery on the digestive system: Secondary | ICD-10-CM | POA: Diagnosis not present

## 2015-04-17 DIAGNOSIS — N189 Chronic kidney disease, unspecified: Secondary | ICD-10-CM | POA: Diagnosis not present

## 2015-04-17 DIAGNOSIS — L97321 Non-pressure chronic ulcer of left ankle limited to breakdown of skin: Secondary | ICD-10-CM | POA: Diagnosis not present

## 2015-04-17 DIAGNOSIS — I129 Hypertensive chronic kidney disease with stage 1 through stage 4 chronic kidney disease, or unspecified chronic kidney disease: Secondary | ICD-10-CM | POA: Diagnosis not present

## 2015-04-17 DIAGNOSIS — E119 Type 2 diabetes mellitus without complications: Secondary | ICD-10-CM | POA: Diagnosis not present

## 2015-04-17 DIAGNOSIS — I87312 Chronic venous hypertension (idiopathic) with ulcer of left lower extremity: Secondary | ICD-10-CM | POA: Diagnosis not present

## 2015-04-17 DIAGNOSIS — Z48815 Encounter for surgical aftercare following surgery on the digestive system: Secondary | ICD-10-CM | POA: Diagnosis not present

## 2015-04-18 DIAGNOSIS — L97321 Non-pressure chronic ulcer of left ankle limited to breakdown of skin: Secondary | ICD-10-CM | POA: Diagnosis not present

## 2015-04-18 DIAGNOSIS — Z48815 Encounter for surgical aftercare following surgery on the digestive system: Secondary | ICD-10-CM | POA: Diagnosis not present

## 2015-04-18 DIAGNOSIS — N189 Chronic kidney disease, unspecified: Secondary | ICD-10-CM | POA: Diagnosis not present

## 2015-04-18 DIAGNOSIS — I129 Hypertensive chronic kidney disease with stage 1 through stage 4 chronic kidney disease, or unspecified chronic kidney disease: Secondary | ICD-10-CM | POA: Diagnosis not present

## 2015-04-18 DIAGNOSIS — E119 Type 2 diabetes mellitus without complications: Secondary | ICD-10-CM | POA: Diagnosis not present

## 2015-04-18 DIAGNOSIS — I87312 Chronic venous hypertension (idiopathic) with ulcer of left lower extremity: Secondary | ICD-10-CM | POA: Diagnosis not present

## 2015-04-19 ENCOUNTER — Other Ambulatory Visit: Payer: Self-pay | Admitting: Family Medicine

## 2015-04-19 DIAGNOSIS — L97321 Non-pressure chronic ulcer of left ankle limited to breakdown of skin: Secondary | ICD-10-CM | POA: Diagnosis not present

## 2015-04-19 DIAGNOSIS — I129 Hypertensive chronic kidney disease with stage 1 through stage 4 chronic kidney disease, or unspecified chronic kidney disease: Secondary | ICD-10-CM | POA: Diagnosis not present

## 2015-04-19 DIAGNOSIS — E119 Type 2 diabetes mellitus without complications: Secondary | ICD-10-CM | POA: Diagnosis not present

## 2015-04-19 DIAGNOSIS — I87312 Chronic venous hypertension (idiopathic) with ulcer of left lower extremity: Secondary | ICD-10-CM | POA: Diagnosis not present

## 2015-04-19 DIAGNOSIS — Z48815 Encounter for surgical aftercare following surgery on the digestive system: Secondary | ICD-10-CM | POA: Diagnosis not present

## 2015-04-19 DIAGNOSIS — N189 Chronic kidney disease, unspecified: Secondary | ICD-10-CM | POA: Diagnosis not present

## 2015-04-22 DIAGNOSIS — T814XXA Infection following a procedure, initial encounter: Secondary | ICD-10-CM | POA: Diagnosis not present

## 2015-04-22 DIAGNOSIS — E119 Type 2 diabetes mellitus without complications: Secondary | ICD-10-CM | POA: Diagnosis not present

## 2015-04-22 DIAGNOSIS — Z48815 Encounter for surgical aftercare following surgery on the digestive system: Secondary | ICD-10-CM | POA: Diagnosis not present

## 2015-04-22 DIAGNOSIS — L97321 Non-pressure chronic ulcer of left ankle limited to breakdown of skin: Secondary | ICD-10-CM | POA: Diagnosis not present

## 2015-04-22 DIAGNOSIS — I129 Hypertensive chronic kidney disease with stage 1 through stage 4 chronic kidney disease, or unspecified chronic kidney disease: Secondary | ICD-10-CM | POA: Diagnosis not present

## 2015-04-22 DIAGNOSIS — I87312 Chronic venous hypertension (idiopathic) with ulcer of left lower extremity: Secondary | ICD-10-CM | POA: Diagnosis not present

## 2015-04-22 DIAGNOSIS — N189 Chronic kidney disease, unspecified: Secondary | ICD-10-CM | POA: Diagnosis not present

## 2015-04-23 ENCOUNTER — Ambulatory Visit: Payer: Medicare Other | Admitting: Orthopedic Surgery

## 2015-04-23 DIAGNOSIS — N189 Chronic kidney disease, unspecified: Secondary | ICD-10-CM | POA: Diagnosis not present

## 2015-04-23 DIAGNOSIS — I129 Hypertensive chronic kidney disease with stage 1 through stage 4 chronic kidney disease, or unspecified chronic kidney disease: Secondary | ICD-10-CM | POA: Diagnosis not present

## 2015-04-23 DIAGNOSIS — E039 Hypothyroidism, unspecified: Secondary | ICD-10-CM | POA: Diagnosis not present

## 2015-04-23 DIAGNOSIS — E119 Type 2 diabetes mellitus without complications: Secondary | ICD-10-CM | POA: Diagnosis not present

## 2015-04-23 DIAGNOSIS — I87312 Chronic venous hypertension (idiopathic) with ulcer of left lower extremity: Secondary | ICD-10-CM | POA: Diagnosis not present

## 2015-04-23 DIAGNOSIS — Z48815 Encounter for surgical aftercare following surgery on the digestive system: Secondary | ICD-10-CM | POA: Diagnosis not present

## 2015-04-23 DIAGNOSIS — L97321 Non-pressure chronic ulcer of left ankle limited to breakdown of skin: Secondary | ICD-10-CM | POA: Diagnosis not present

## 2015-04-23 DIAGNOSIS — E782 Mixed hyperlipidemia: Secondary | ICD-10-CM | POA: Diagnosis not present

## 2015-04-23 DIAGNOSIS — I10 Essential (primary) hypertension: Secondary | ICD-10-CM | POA: Diagnosis not present

## 2015-04-23 DIAGNOSIS — E1122 Type 2 diabetes mellitus with diabetic chronic kidney disease: Secondary | ICD-10-CM | POA: Diagnosis not present

## 2015-04-24 DIAGNOSIS — I87312 Chronic venous hypertension (idiopathic) with ulcer of left lower extremity: Secondary | ICD-10-CM | POA: Diagnosis not present

## 2015-04-24 DIAGNOSIS — L97321 Non-pressure chronic ulcer of left ankle limited to breakdown of skin: Secondary | ICD-10-CM | POA: Diagnosis not present

## 2015-04-24 DIAGNOSIS — E119 Type 2 diabetes mellitus without complications: Secondary | ICD-10-CM | POA: Diagnosis not present

## 2015-04-24 DIAGNOSIS — I129 Hypertensive chronic kidney disease with stage 1 through stage 4 chronic kidney disease, or unspecified chronic kidney disease: Secondary | ICD-10-CM | POA: Diagnosis not present

## 2015-04-24 DIAGNOSIS — N189 Chronic kidney disease, unspecified: Secondary | ICD-10-CM | POA: Diagnosis not present

## 2015-04-24 DIAGNOSIS — Z48815 Encounter for surgical aftercare following surgery on the digestive system: Secondary | ICD-10-CM | POA: Diagnosis not present

## 2015-04-26 DIAGNOSIS — Z48815 Encounter for surgical aftercare following surgery on the digestive system: Secondary | ICD-10-CM | POA: Diagnosis not present

## 2015-04-26 DIAGNOSIS — I129 Hypertensive chronic kidney disease with stage 1 through stage 4 chronic kidney disease, or unspecified chronic kidney disease: Secondary | ICD-10-CM | POA: Diagnosis not present

## 2015-04-26 DIAGNOSIS — N189 Chronic kidney disease, unspecified: Secondary | ICD-10-CM | POA: Diagnosis not present

## 2015-04-26 DIAGNOSIS — E119 Type 2 diabetes mellitus without complications: Secondary | ICD-10-CM | POA: Diagnosis not present

## 2015-04-26 DIAGNOSIS — L97321 Non-pressure chronic ulcer of left ankle limited to breakdown of skin: Secondary | ICD-10-CM | POA: Diagnosis not present

## 2015-04-26 DIAGNOSIS — I87312 Chronic venous hypertension (idiopathic) with ulcer of left lower extremity: Secondary | ICD-10-CM | POA: Diagnosis not present

## 2015-04-29 DIAGNOSIS — E11319 Type 2 diabetes mellitus with unspecified diabetic retinopathy without macular edema: Secondary | ICD-10-CM | POA: Diagnosis not present

## 2015-04-29 DIAGNOSIS — N189 Chronic kidney disease, unspecified: Secondary | ICD-10-CM | POA: Diagnosis not present

## 2015-04-29 DIAGNOSIS — L97321 Non-pressure chronic ulcer of left ankle limited to breakdown of skin: Secondary | ICD-10-CM | POA: Diagnosis not present

## 2015-04-29 DIAGNOSIS — I129 Hypertensive chronic kidney disease with stage 1 through stage 4 chronic kidney disease, or unspecified chronic kidney disease: Secondary | ICD-10-CM | POA: Diagnosis not present

## 2015-04-29 DIAGNOSIS — I87312 Chronic venous hypertension (idiopathic) with ulcer of left lower extremity: Secondary | ICD-10-CM | POA: Diagnosis not present

## 2015-04-29 DIAGNOSIS — Z48815 Encounter for surgical aftercare following surgery on the digestive system: Secondary | ICD-10-CM | POA: Diagnosis not present

## 2015-04-29 DIAGNOSIS — E119 Type 2 diabetes mellitus without complications: Secondary | ICD-10-CM | POA: Diagnosis not present

## 2015-04-30 DIAGNOSIS — I87312 Chronic venous hypertension (idiopathic) with ulcer of left lower extremity: Secondary | ICD-10-CM | POA: Diagnosis not present

## 2015-04-30 DIAGNOSIS — Z48815 Encounter for surgical aftercare following surgery on the digestive system: Secondary | ICD-10-CM | POA: Diagnosis not present

## 2015-04-30 DIAGNOSIS — E119 Type 2 diabetes mellitus without complications: Secondary | ICD-10-CM | POA: Diagnosis not present

## 2015-04-30 DIAGNOSIS — L97321 Non-pressure chronic ulcer of left ankle limited to breakdown of skin: Secondary | ICD-10-CM | POA: Diagnosis not present

## 2015-04-30 DIAGNOSIS — I129 Hypertensive chronic kidney disease with stage 1 through stage 4 chronic kidney disease, or unspecified chronic kidney disease: Secondary | ICD-10-CM | POA: Diagnosis not present

## 2015-04-30 DIAGNOSIS — N189 Chronic kidney disease, unspecified: Secondary | ICD-10-CM | POA: Diagnosis not present

## 2015-04-30 DIAGNOSIS — K8010 Calculus of gallbladder with chronic cholecystitis without obstruction: Secondary | ICD-10-CM | POA: Diagnosis not present

## 2015-05-01 ENCOUNTER — Telehealth: Payer: Self-pay | Admitting: *Deleted

## 2015-05-01 DIAGNOSIS — L97321 Non-pressure chronic ulcer of left ankle limited to breakdown of skin: Secondary | ICD-10-CM | POA: Diagnosis not present

## 2015-05-01 DIAGNOSIS — N189 Chronic kidney disease, unspecified: Secondary | ICD-10-CM | POA: Diagnosis not present

## 2015-05-01 DIAGNOSIS — I129 Hypertensive chronic kidney disease with stage 1 through stage 4 chronic kidney disease, or unspecified chronic kidney disease: Secondary | ICD-10-CM | POA: Diagnosis not present

## 2015-05-01 DIAGNOSIS — I87312 Chronic venous hypertension (idiopathic) with ulcer of left lower extremity: Secondary | ICD-10-CM | POA: Diagnosis not present

## 2015-05-01 DIAGNOSIS — Z48815 Encounter for surgical aftercare following surgery on the digestive system: Secondary | ICD-10-CM | POA: Diagnosis not present

## 2015-05-01 DIAGNOSIS — E119 Type 2 diabetes mellitus without complications: Secondary | ICD-10-CM | POA: Diagnosis not present

## 2015-05-01 NOTE — Telephone Encounter (Signed)
Pt's wife called [facility] requesting Loma Sousa to call her back at [phone].

## 2015-05-02 ENCOUNTER — Other Ambulatory Visit: Payer: Self-pay

## 2015-05-02 DIAGNOSIS — Z48815 Encounter for surgical aftercare following surgery on the digestive system: Secondary | ICD-10-CM | POA: Diagnosis not present

## 2015-05-02 DIAGNOSIS — I87312 Chronic venous hypertension (idiopathic) with ulcer of left lower extremity: Secondary | ICD-10-CM | POA: Diagnosis not present

## 2015-05-02 DIAGNOSIS — I129 Hypertensive chronic kidney disease with stage 1 through stage 4 chronic kidney disease, or unspecified chronic kidney disease: Secondary | ICD-10-CM | POA: Diagnosis not present

## 2015-05-02 DIAGNOSIS — L97321 Non-pressure chronic ulcer of left ankle limited to breakdown of skin: Secondary | ICD-10-CM | POA: Diagnosis not present

## 2015-05-02 DIAGNOSIS — N189 Chronic kidney disease, unspecified: Secondary | ICD-10-CM | POA: Diagnosis not present

## 2015-05-02 DIAGNOSIS — E119 Type 2 diabetes mellitus without complications: Secondary | ICD-10-CM | POA: Diagnosis not present

## 2015-05-02 MED ORDER — CLOPIDOGREL BISULFATE 75 MG PO TABS: 75.0000 mg | ORAL_TABLET | Freq: Once | ORAL | Status: DC

## 2015-05-02 NOTE — Telephone Encounter (Signed)
Called and spoke with wife and clarified that patient should be on Plavix.

## 2015-05-03 DIAGNOSIS — Z48815 Encounter for surgical aftercare following surgery on the digestive system: Secondary | ICD-10-CM | POA: Diagnosis not present

## 2015-05-03 DIAGNOSIS — I129 Hypertensive chronic kidney disease with stage 1 through stage 4 chronic kidney disease, or unspecified chronic kidney disease: Secondary | ICD-10-CM | POA: Diagnosis not present

## 2015-05-03 DIAGNOSIS — I87312 Chronic venous hypertension (idiopathic) with ulcer of left lower extremity: Secondary | ICD-10-CM | POA: Diagnosis not present

## 2015-05-03 DIAGNOSIS — E119 Type 2 diabetes mellitus without complications: Secondary | ICD-10-CM | POA: Diagnosis not present

## 2015-05-03 DIAGNOSIS — L97321 Non-pressure chronic ulcer of left ankle limited to breakdown of skin: Secondary | ICD-10-CM | POA: Diagnosis not present

## 2015-05-03 DIAGNOSIS — N189 Chronic kidney disease, unspecified: Secondary | ICD-10-CM | POA: Diagnosis not present

## 2015-05-08 DIAGNOSIS — N189 Chronic kidney disease, unspecified: Secondary | ICD-10-CM | POA: Diagnosis not present

## 2015-05-08 DIAGNOSIS — L97321 Non-pressure chronic ulcer of left ankle limited to breakdown of skin: Secondary | ICD-10-CM | POA: Diagnosis not present

## 2015-05-08 DIAGNOSIS — I87312 Chronic venous hypertension (idiopathic) with ulcer of left lower extremity: Secondary | ICD-10-CM | POA: Diagnosis not present

## 2015-05-08 DIAGNOSIS — I129 Hypertensive chronic kidney disease with stage 1 through stage 4 chronic kidney disease, or unspecified chronic kidney disease: Secondary | ICD-10-CM | POA: Diagnosis not present

## 2015-05-08 DIAGNOSIS — E119 Type 2 diabetes mellitus without complications: Secondary | ICD-10-CM | POA: Diagnosis not present

## 2015-05-08 DIAGNOSIS — K8010 Calculus of gallbladder with chronic cholecystitis without obstruction: Secondary | ICD-10-CM | POA: Diagnosis not present

## 2015-05-08 DIAGNOSIS — Z48815 Encounter for surgical aftercare following surgery on the digestive system: Secondary | ICD-10-CM | POA: Diagnosis not present

## 2015-05-09 DIAGNOSIS — L97321 Non-pressure chronic ulcer of left ankle limited to breakdown of skin: Secondary | ICD-10-CM | POA: Diagnosis not present

## 2015-05-09 DIAGNOSIS — Z48815 Encounter for surgical aftercare following surgery on the digestive system: Secondary | ICD-10-CM | POA: Diagnosis not present

## 2015-05-09 DIAGNOSIS — N189 Chronic kidney disease, unspecified: Secondary | ICD-10-CM | POA: Diagnosis not present

## 2015-05-09 DIAGNOSIS — I87312 Chronic venous hypertension (idiopathic) with ulcer of left lower extremity: Secondary | ICD-10-CM | POA: Diagnosis not present

## 2015-05-09 DIAGNOSIS — I129 Hypertensive chronic kidney disease with stage 1 through stage 4 chronic kidney disease, or unspecified chronic kidney disease: Secondary | ICD-10-CM | POA: Diagnosis not present

## 2015-05-09 DIAGNOSIS — E119 Type 2 diabetes mellitus without complications: Secondary | ICD-10-CM | POA: Diagnosis not present

## 2015-05-10 DIAGNOSIS — E119 Type 2 diabetes mellitus without complications: Secondary | ICD-10-CM | POA: Diagnosis not present

## 2015-05-10 DIAGNOSIS — Z48815 Encounter for surgical aftercare following surgery on the digestive system: Secondary | ICD-10-CM | POA: Diagnosis not present

## 2015-05-10 DIAGNOSIS — I129 Hypertensive chronic kidney disease with stage 1 through stage 4 chronic kidney disease, or unspecified chronic kidney disease: Secondary | ICD-10-CM | POA: Diagnosis not present

## 2015-05-10 DIAGNOSIS — I87312 Chronic venous hypertension (idiopathic) with ulcer of left lower extremity: Secondary | ICD-10-CM | POA: Diagnosis not present

## 2015-05-10 DIAGNOSIS — Z794 Long term (current) use of insulin: Secondary | ICD-10-CM | POA: Diagnosis not present

## 2015-05-10 DIAGNOSIS — E785 Hyperlipidemia, unspecified: Secondary | ICD-10-CM | POA: Diagnosis not present

## 2015-05-10 DIAGNOSIS — L97321 Non-pressure chronic ulcer of left ankle limited to breakdown of skin: Secondary | ICD-10-CM | POA: Diagnosis not present

## 2015-05-10 DIAGNOSIS — N189 Chronic kidney disease, unspecified: Secondary | ICD-10-CM | POA: Diagnosis not present

## 2015-05-13 DIAGNOSIS — E039 Hypothyroidism, unspecified: Secondary | ICD-10-CM | POA: Diagnosis not present

## 2015-05-13 DIAGNOSIS — Z794 Long term (current) use of insulin: Secondary | ICD-10-CM | POA: Diagnosis not present

## 2015-05-13 DIAGNOSIS — N189 Chronic kidney disease, unspecified: Secondary | ICD-10-CM | POA: Diagnosis not present

## 2015-05-13 DIAGNOSIS — Z48815 Encounter for surgical aftercare following surgery on the digestive system: Secondary | ICD-10-CM | POA: Diagnosis not present

## 2015-05-13 DIAGNOSIS — I129 Hypertensive chronic kidney disease with stage 1 through stage 4 chronic kidney disease, or unspecified chronic kidney disease: Secondary | ICD-10-CM | POA: Diagnosis not present

## 2015-05-13 DIAGNOSIS — E119 Type 2 diabetes mellitus without complications: Secondary | ICD-10-CM | POA: Diagnosis not present

## 2015-05-13 DIAGNOSIS — E785 Hyperlipidemia, unspecified: Secondary | ICD-10-CM | POA: Diagnosis not present

## 2015-05-16 DIAGNOSIS — Z48815 Encounter for surgical aftercare following surgery on the digestive system: Secondary | ICD-10-CM | POA: Diagnosis not present

## 2015-05-16 DIAGNOSIS — I129 Hypertensive chronic kidney disease with stage 1 through stage 4 chronic kidney disease, or unspecified chronic kidney disease: Secondary | ICD-10-CM | POA: Diagnosis not present

## 2015-05-16 DIAGNOSIS — E119 Type 2 diabetes mellitus without complications: Secondary | ICD-10-CM | POA: Diagnosis not present

## 2015-05-16 DIAGNOSIS — E039 Hypothyroidism, unspecified: Secondary | ICD-10-CM | POA: Diagnosis not present

## 2015-05-16 DIAGNOSIS — N189 Chronic kidney disease, unspecified: Secondary | ICD-10-CM | POA: Diagnosis not present

## 2015-05-16 DIAGNOSIS — E785 Hyperlipidemia, unspecified: Secondary | ICD-10-CM | POA: Diagnosis not present

## 2015-05-18 ENCOUNTER — Other Ambulatory Visit: Payer: Self-pay | Admitting: Family Medicine

## 2015-05-18 DIAGNOSIS — Z09 Encounter for follow-up examination after completed treatment for conditions other than malignant neoplasm: Secondary | ICD-10-CM | POA: Insufficient documentation

## 2015-05-18 NOTE — Assessment & Plan Note (Signed)
Controlled, no change in medication DASH diet and commitment to daily physical activity for a minimum of 30 minutes discussed and encouraged, as a part of hypertension management. The importance of attaining a healthy weight is also discussed.  BP/Weight 04/02/2015 12/06/2014 12/01/2014 11/16/2014 10/16/2014 [id number]  Systolic BP XX123456 Q000111Q - 123456 0000000 123XX123 0000000  Diastolic BP 60 68 - 68 71 78 70  Wt. (Lbs) 181.12 - 194.01 190.5 187 190.4 191.8  BMI 27.55 - 29.51 28.97 28.44 28.96 29.17

## 2015-05-18 NOTE — Assessment & Plan Note (Signed)
Maintained on kepra, no further recurrences once compliant with medication , approx 3 years, followed by neurology

## 2015-05-18 NOTE — Assessment & Plan Note (Addendum)
Over corrected med adjustment needed, with rept lab in 6 to 8 weeks

## 2015-05-18 NOTE — Assessment & Plan Note (Signed)
Hyperlipidemia:Low fat diet discussed and encouraged.   Lipid Panel  Lab Results  Component Value Date   CHOL 142 04/02/2015   HDL 72 04/02/2015   LDLCALC 55 04/02/2015   TRIG 77 04/02/2015   CHOLHDL 2.0 04/02/2015   Controlled, no change in medication

## 2015-05-18 NOTE — Assessment & Plan Note (Signed)
No current lower extremity skin breakdown or ulcer , followed by Dr Nils Pyle

## 2015-05-18 NOTE — Assessment & Plan Note (Addendum)
Well controlled, needs to re establish with endo, hopefully he will maintain good diet once at home Patient educated about the importance of limiting  Carbohydrate intake , the need to commit to daily physical activity for a minimum of 30 minutes , and to commit weight loss. The fact that changes in all these areas will reduce or eliminate all together the development of diabetes is stressed.   Diabetic Labs Latest Ref Rng 04/02/2015 12/06/2014 12/05/2014 12/04/2014 12/03/2014  HbA1c <5.7 % 6.7(H) - - - -  Microalbumin 0.00 - 1.89 mg/dL - - - - -  Micro/Creat Ratio 0.0 - 30.0 mg/g - - - - -  Chol 0 - 200 mg/dL 142 - - - -  HDL >=40 mg/dL 72 - - - -  Calc LDL 0 - 99 mg/dL 55 - - - -  Triglycerides <150 mg/dL 77 - - - -  Creatinine 0.50 - 1.35 mg/dL 2.18(H) 2.28(H) 2.40(H) 2.68(H) 2.66(H)   BP/Weight 04/02/2015 12/06/2014 12/01/2014 11/16/2014 10/16/2014 [id number]  Systolic BP XX123456 Q000111Q - 123456 0000000 123XX123 0000000  Diastolic BP 60 68 - 68 71 78 70  Wt. (Lbs) 181.12 - 194.01 190.5 187 190.4 191.8  BMI 27.55 - 29.51 28.97 28.44 28.96 29.17   Foot/eye exam completion dates Latest Ref Rng 06/12/2014 03/26/2014  Eye Exam No Retinopathy - Retinopathy(A)  Foot exam Order - - -  Foot Form Completion - Done -

## 2015-05-18 NOTE — Assessment & Plan Note (Signed)
Managed successfully by conservative measures

## 2015-05-18 NOTE — Assessment & Plan Note (Signed)
Admitted from 12/25 to 12/05/2014 with acute cholecystitis, then transferred to SNF for over 2 months rehab, im to see more for first time , and doing extremely well

## 2015-05-20 DIAGNOSIS — I129 Hypertensive chronic kidney disease with stage 1 through stage 4 chronic kidney disease, or unspecified chronic kidney disease: Secondary | ICD-10-CM | POA: Diagnosis not present

## 2015-05-20 DIAGNOSIS — E039 Hypothyroidism, unspecified: Secondary | ICD-10-CM | POA: Diagnosis not present

## 2015-05-20 DIAGNOSIS — N189 Chronic kidney disease, unspecified: Secondary | ICD-10-CM | POA: Diagnosis not present

## 2015-05-20 DIAGNOSIS — E119 Type 2 diabetes mellitus without complications: Secondary | ICD-10-CM | POA: Diagnosis not present

## 2015-05-20 DIAGNOSIS — E785 Hyperlipidemia, unspecified: Secondary | ICD-10-CM | POA: Diagnosis not present

## 2015-05-20 DIAGNOSIS — Z48815 Encounter for surgical aftercare following surgery on the digestive system: Secondary | ICD-10-CM | POA: Diagnosis not present

## 2015-05-22 DIAGNOSIS — Z434 Encounter for attention to other artificial openings of digestive tract: Secondary | ICD-10-CM | POA: Diagnosis not present

## 2015-05-23 DIAGNOSIS — E785 Hyperlipidemia, unspecified: Secondary | ICD-10-CM | POA: Diagnosis not present

## 2015-05-23 DIAGNOSIS — N184 Chronic kidney disease, stage 4 (severe): Secondary | ICD-10-CM | POA: Diagnosis not present

## 2015-05-23 DIAGNOSIS — N189 Chronic kidney disease, unspecified: Secondary | ICD-10-CM | POA: Diagnosis not present

## 2015-05-23 DIAGNOSIS — E039 Hypothyroidism, unspecified: Secondary | ICD-10-CM | POA: Diagnosis not present

## 2015-05-23 DIAGNOSIS — I129 Hypertensive chronic kidney disease with stage 1 through stage 4 chronic kidney disease, or unspecified chronic kidney disease: Secondary | ICD-10-CM | POA: Diagnosis not present

## 2015-05-23 DIAGNOSIS — Z48815 Encounter for surgical aftercare following surgery on the digestive system: Secondary | ICD-10-CM | POA: Diagnosis not present

## 2015-05-23 DIAGNOSIS — E119 Type 2 diabetes mellitus without complications: Secondary | ICD-10-CM | POA: Diagnosis not present

## 2015-05-28 DIAGNOSIS — E119 Type 2 diabetes mellitus without complications: Secondary | ICD-10-CM | POA: Diagnosis not present

## 2015-05-28 DIAGNOSIS — Z48815 Encounter for surgical aftercare following surgery on the digestive system: Secondary | ICD-10-CM | POA: Diagnosis not present

## 2015-05-28 DIAGNOSIS — I129 Hypertensive chronic kidney disease with stage 1 through stage 4 chronic kidney disease, or unspecified chronic kidney disease: Secondary | ICD-10-CM | POA: Diagnosis not present

## 2015-05-28 DIAGNOSIS — E039 Hypothyroidism, unspecified: Secondary | ICD-10-CM | POA: Diagnosis not present

## 2015-05-28 DIAGNOSIS — E785 Hyperlipidemia, unspecified: Secondary | ICD-10-CM | POA: Diagnosis not present

## 2015-05-28 DIAGNOSIS — N189 Chronic kidney disease, unspecified: Secondary | ICD-10-CM | POA: Diagnosis not present

## 2015-05-29 DIAGNOSIS — N189 Chronic kidney disease, unspecified: Secondary | ICD-10-CM | POA: Diagnosis not present

## 2015-05-29 DIAGNOSIS — E119 Type 2 diabetes mellitus without complications: Secondary | ICD-10-CM | POA: Diagnosis not present

## 2015-05-29 DIAGNOSIS — Z48815 Encounter for surgical aftercare following surgery on the digestive system: Secondary | ICD-10-CM | POA: Diagnosis not present

## 2015-05-29 DIAGNOSIS — E039 Hypothyroidism, unspecified: Secondary | ICD-10-CM | POA: Diagnosis not present

## 2015-05-29 DIAGNOSIS — E785 Hyperlipidemia, unspecified: Secondary | ICD-10-CM | POA: Diagnosis not present

## 2015-05-29 DIAGNOSIS — I129 Hypertensive chronic kidney disease with stage 1 through stage 4 chronic kidney disease, or unspecified chronic kidney disease: Secondary | ICD-10-CM | POA: Diagnosis not present

## 2015-05-30 DIAGNOSIS — N189 Chronic kidney disease, unspecified: Secondary | ICD-10-CM | POA: Diagnosis not present

## 2015-05-30 DIAGNOSIS — E039 Hypothyroidism, unspecified: Secondary | ICD-10-CM | POA: Diagnosis not present

## 2015-05-30 DIAGNOSIS — Z48815 Encounter for surgical aftercare following surgery on the digestive system: Secondary | ICD-10-CM | POA: Diagnosis not present

## 2015-05-30 DIAGNOSIS — E785 Hyperlipidemia, unspecified: Secondary | ICD-10-CM | POA: Diagnosis not present

## 2015-05-30 DIAGNOSIS — E119 Type 2 diabetes mellitus without complications: Secondary | ICD-10-CM | POA: Diagnosis not present

## 2015-05-30 DIAGNOSIS — I129 Hypertensive chronic kidney disease with stage 1 through stage 4 chronic kidney disease, or unspecified chronic kidney disease: Secondary | ICD-10-CM | POA: Diagnosis not present

## 2015-06-04 DIAGNOSIS — E785 Hyperlipidemia, unspecified: Secondary | ICD-10-CM | POA: Diagnosis not present

## 2015-06-04 DIAGNOSIS — Z48815 Encounter for surgical aftercare following surgery on the digestive system: Secondary | ICD-10-CM | POA: Diagnosis not present

## 2015-06-04 DIAGNOSIS — E039 Hypothyroidism, unspecified: Secondary | ICD-10-CM | POA: Diagnosis not present

## 2015-06-04 DIAGNOSIS — E119 Type 2 diabetes mellitus without complications: Secondary | ICD-10-CM | POA: Diagnosis not present

## 2015-06-04 DIAGNOSIS — N189 Chronic kidney disease, unspecified: Secondary | ICD-10-CM | POA: Diagnosis not present

## 2015-06-04 DIAGNOSIS — I129 Hypertensive chronic kidney disease with stage 1 through stage 4 chronic kidney disease, or unspecified chronic kidney disease: Secondary | ICD-10-CM | POA: Diagnosis not present

## 2015-06-06 DIAGNOSIS — E039 Hypothyroidism, unspecified: Secondary | ICD-10-CM | POA: Diagnosis not present

## 2015-06-06 DIAGNOSIS — E119 Type 2 diabetes mellitus without complications: Secondary | ICD-10-CM | POA: Diagnosis not present

## 2015-06-06 DIAGNOSIS — N189 Chronic kidney disease, unspecified: Secondary | ICD-10-CM | POA: Diagnosis not present

## 2015-06-06 DIAGNOSIS — N184 Chronic kidney disease, stage 4 (severe): Secondary | ICD-10-CM | POA: Diagnosis not present

## 2015-06-06 DIAGNOSIS — E785 Hyperlipidemia, unspecified: Secondary | ICD-10-CM | POA: Diagnosis not present

## 2015-06-06 DIAGNOSIS — I129 Hypertensive chronic kidney disease with stage 1 through stage 4 chronic kidney disease, or unspecified chronic kidney disease: Secondary | ICD-10-CM | POA: Diagnosis not present

## 2015-06-06 DIAGNOSIS — Z48815 Encounter for surgical aftercare following surgery on the digestive system: Secondary | ICD-10-CM | POA: Diagnosis not present

## 2015-06-11 DIAGNOSIS — I129 Hypertensive chronic kidney disease with stage 1 through stage 4 chronic kidney disease, or unspecified chronic kidney disease: Secondary | ICD-10-CM | POA: Diagnosis not present

## 2015-06-11 DIAGNOSIS — E119 Type 2 diabetes mellitus without complications: Secondary | ICD-10-CM | POA: Diagnosis not present

## 2015-06-11 DIAGNOSIS — Z48815 Encounter for surgical aftercare following surgery on the digestive system: Secondary | ICD-10-CM | POA: Diagnosis not present

## 2015-06-11 DIAGNOSIS — E785 Hyperlipidemia, unspecified: Secondary | ICD-10-CM | POA: Diagnosis not present

## 2015-06-11 DIAGNOSIS — N189 Chronic kidney disease, unspecified: Secondary | ICD-10-CM | POA: Diagnosis not present

## 2015-06-11 DIAGNOSIS — E039 Hypothyroidism, unspecified: Secondary | ICD-10-CM | POA: Diagnosis not present

## 2015-06-12 DIAGNOSIS — Z434 Encounter for attention to other artificial openings of digestive tract: Secondary | ICD-10-CM | POA: Diagnosis not present

## 2015-06-13 DIAGNOSIS — E039 Hypothyroidism, unspecified: Secondary | ICD-10-CM | POA: Diagnosis not present

## 2015-06-13 DIAGNOSIS — Z48815 Encounter for surgical aftercare following surgery on the digestive system: Secondary | ICD-10-CM | POA: Diagnosis not present

## 2015-06-13 DIAGNOSIS — E785 Hyperlipidemia, unspecified: Secondary | ICD-10-CM | POA: Diagnosis not present

## 2015-06-13 DIAGNOSIS — I129 Hypertensive chronic kidney disease with stage 1 through stage 4 chronic kidney disease, or unspecified chronic kidney disease: Secondary | ICD-10-CM | POA: Diagnosis not present

## 2015-06-13 DIAGNOSIS — E119 Type 2 diabetes mellitus without complications: Secondary | ICD-10-CM | POA: Diagnosis not present

## 2015-06-13 DIAGNOSIS — N189 Chronic kidney disease, unspecified: Secondary | ICD-10-CM | POA: Diagnosis not present

## 2015-06-14 DIAGNOSIS — Z48815 Encounter for surgical aftercare following surgery on the digestive system: Secondary | ICD-10-CM | POA: Diagnosis not present

## 2015-06-14 DIAGNOSIS — E785 Hyperlipidemia, unspecified: Secondary | ICD-10-CM | POA: Diagnosis not present

## 2015-06-14 DIAGNOSIS — E039 Hypothyroidism, unspecified: Secondary | ICD-10-CM | POA: Diagnosis not present

## 2015-06-14 DIAGNOSIS — I129 Hypertensive chronic kidney disease with stage 1 through stage 4 chronic kidney disease, or unspecified chronic kidney disease: Secondary | ICD-10-CM | POA: Diagnosis not present

## 2015-06-14 DIAGNOSIS — E119 Type 2 diabetes mellitus without complications: Secondary | ICD-10-CM | POA: Diagnosis not present

## 2015-06-14 DIAGNOSIS — N189 Chronic kidney disease, unspecified: Secondary | ICD-10-CM | POA: Diagnosis not present

## 2015-06-19 DIAGNOSIS — Z48815 Encounter for surgical aftercare following surgery on the digestive system: Secondary | ICD-10-CM | POA: Diagnosis not present

## 2015-06-19 DIAGNOSIS — N189 Chronic kidney disease, unspecified: Secondary | ICD-10-CM | POA: Diagnosis not present

## 2015-06-19 DIAGNOSIS — E039 Hypothyroidism, unspecified: Secondary | ICD-10-CM | POA: Diagnosis not present

## 2015-06-19 DIAGNOSIS — I129 Hypertensive chronic kidney disease with stage 1 through stage 4 chronic kidney disease, or unspecified chronic kidney disease: Secondary | ICD-10-CM | POA: Diagnosis not present

## 2015-06-19 DIAGNOSIS — E119 Type 2 diabetes mellitus without complications: Secondary | ICD-10-CM | POA: Diagnosis not present

## 2015-06-19 DIAGNOSIS — E785 Hyperlipidemia, unspecified: Secondary | ICD-10-CM | POA: Diagnosis not present

## 2015-06-20 DIAGNOSIS — E785 Hyperlipidemia, unspecified: Secondary | ICD-10-CM | POA: Diagnosis not present

## 2015-06-20 DIAGNOSIS — Z48815 Encounter for surgical aftercare following surgery on the digestive system: Secondary | ICD-10-CM | POA: Diagnosis not present

## 2015-06-20 DIAGNOSIS — N189 Chronic kidney disease, unspecified: Secondary | ICD-10-CM | POA: Diagnosis not present

## 2015-06-20 DIAGNOSIS — E119 Type 2 diabetes mellitus without complications: Secondary | ICD-10-CM | POA: Diagnosis not present

## 2015-06-20 DIAGNOSIS — I129 Hypertensive chronic kidney disease with stage 1 through stage 4 chronic kidney disease, or unspecified chronic kidney disease: Secondary | ICD-10-CM | POA: Diagnosis not present

## 2015-06-20 DIAGNOSIS — E039 Hypothyroidism, unspecified: Secondary | ICD-10-CM | POA: Diagnosis not present

## 2015-06-20 DIAGNOSIS — N184 Chronic kidney disease, stage 4 (severe): Secondary | ICD-10-CM | POA: Diagnosis not present

## 2015-06-25 DIAGNOSIS — N189 Chronic kidney disease, unspecified: Secondary | ICD-10-CM | POA: Diagnosis not present

## 2015-06-25 DIAGNOSIS — E119 Type 2 diabetes mellitus without complications: Secondary | ICD-10-CM | POA: Diagnosis not present

## 2015-06-25 DIAGNOSIS — E039 Hypothyroidism, unspecified: Secondary | ICD-10-CM | POA: Diagnosis not present

## 2015-06-25 DIAGNOSIS — E785 Hyperlipidemia, unspecified: Secondary | ICD-10-CM | POA: Diagnosis not present

## 2015-06-25 DIAGNOSIS — Z48815 Encounter for surgical aftercare following surgery on the digestive system: Secondary | ICD-10-CM | POA: Diagnosis not present

## 2015-06-25 DIAGNOSIS — I129 Hypertensive chronic kidney disease with stage 1 through stage 4 chronic kidney disease, or unspecified chronic kidney disease: Secondary | ICD-10-CM | POA: Diagnosis not present

## 2015-07-01 DIAGNOSIS — L609 Nail disorder, unspecified: Secondary | ICD-10-CM | POA: Diagnosis not present

## 2015-07-01 DIAGNOSIS — B351 Tinea unguium: Secondary | ICD-10-CM | POA: Diagnosis not present

## 2015-07-01 DIAGNOSIS — E1342 Other specified diabetes mellitus with diabetic polyneuropathy: Secondary | ICD-10-CM | POA: Diagnosis not present

## 2015-07-01 DIAGNOSIS — R600 Localized edema: Secondary | ICD-10-CM | POA: Diagnosis not present

## 2015-07-02 DIAGNOSIS — I129 Hypertensive chronic kidney disease with stage 1 through stage 4 chronic kidney disease, or unspecified chronic kidney disease: Secondary | ICD-10-CM | POA: Diagnosis not present

## 2015-07-02 DIAGNOSIS — N189 Chronic kidney disease, unspecified: Secondary | ICD-10-CM | POA: Diagnosis not present

## 2015-07-02 DIAGNOSIS — E119 Type 2 diabetes mellitus without complications: Secondary | ICD-10-CM | POA: Diagnosis not present

## 2015-07-02 DIAGNOSIS — Z48815 Encounter for surgical aftercare following surgery on the digestive system: Secondary | ICD-10-CM | POA: Diagnosis not present

## 2015-07-02 DIAGNOSIS — E039 Hypothyroidism, unspecified: Secondary | ICD-10-CM | POA: Diagnosis not present

## 2015-07-02 DIAGNOSIS — E785 Hyperlipidemia, unspecified: Secondary | ICD-10-CM | POA: Diagnosis not present

## 2015-07-03 DIAGNOSIS — Z48815 Encounter for surgical aftercare following surgery on the digestive system: Secondary | ICD-10-CM | POA: Diagnosis not present

## 2015-07-03 DIAGNOSIS — E119 Type 2 diabetes mellitus without complications: Secondary | ICD-10-CM | POA: Diagnosis not present

## 2015-07-03 DIAGNOSIS — M159 Polyosteoarthritis, unspecified: Secondary | ICD-10-CM | POA: Diagnosis not present

## 2015-07-03 DIAGNOSIS — G40909 Epilepsy, unspecified, not intractable, without status epilepticus: Secondary | ICD-10-CM | POA: Diagnosis not present

## 2015-07-03 DIAGNOSIS — Z79899 Other long term (current) drug therapy: Secondary | ICD-10-CM | POA: Diagnosis not present

## 2015-07-03 DIAGNOSIS — I129 Hypertensive chronic kidney disease with stage 1 through stage 4 chronic kidney disease, or unspecified chronic kidney disease: Secondary | ICD-10-CM | POA: Diagnosis not present

## 2015-07-03 DIAGNOSIS — E039 Hypothyroidism, unspecified: Secondary | ICD-10-CM | POA: Diagnosis not present

## 2015-07-03 DIAGNOSIS — N189 Chronic kidney disease, unspecified: Secondary | ICD-10-CM | POA: Diagnosis not present

## 2015-07-03 DIAGNOSIS — E785 Hyperlipidemia, unspecified: Secondary | ICD-10-CM | POA: Diagnosis not present

## 2015-07-03 DIAGNOSIS — R2681 Unsteadiness on feet: Secondary | ICD-10-CM | POA: Diagnosis not present

## 2015-07-04 DIAGNOSIS — Z48815 Encounter for surgical aftercare following surgery on the digestive system: Secondary | ICD-10-CM | POA: Diagnosis not present

## 2015-07-04 DIAGNOSIS — L929 Granulomatous disorder of the skin and subcutaneous tissue, unspecified: Secondary | ICD-10-CM | POA: Diagnosis not present

## 2015-07-04 DIAGNOSIS — I129 Hypertensive chronic kidney disease with stage 1 through stage 4 chronic kidney disease, or unspecified chronic kidney disease: Secondary | ICD-10-CM | POA: Diagnosis not present

## 2015-07-04 DIAGNOSIS — E039 Hypothyroidism, unspecified: Secondary | ICD-10-CM | POA: Diagnosis not present

## 2015-07-04 DIAGNOSIS — N184 Chronic kidney disease, stage 4 (severe): Secondary | ICD-10-CM | POA: Diagnosis not present

## 2015-07-04 DIAGNOSIS — E119 Type 2 diabetes mellitus without complications: Secondary | ICD-10-CM | POA: Diagnosis not present

## 2015-07-04 DIAGNOSIS — E785 Hyperlipidemia, unspecified: Secondary | ICD-10-CM | POA: Diagnosis not present

## 2015-07-04 DIAGNOSIS — N189 Chronic kidney disease, unspecified: Secondary | ICD-10-CM | POA: Diagnosis not present

## 2015-07-09 DIAGNOSIS — Z48815 Encounter for surgical aftercare following surgery on the digestive system: Secondary | ICD-10-CM | POA: Diagnosis not present

## 2015-07-09 DIAGNOSIS — I129 Hypertensive chronic kidney disease with stage 1 through stage 4 chronic kidney disease, or unspecified chronic kidney disease: Secondary | ICD-10-CM | POA: Diagnosis not present

## 2015-07-09 DIAGNOSIS — E119 Type 2 diabetes mellitus without complications: Secondary | ICD-10-CM | POA: Diagnosis not present

## 2015-07-09 DIAGNOSIS — E785 Hyperlipidemia, unspecified: Secondary | ICD-10-CM | POA: Diagnosis not present

## 2015-07-09 DIAGNOSIS — E039 Hypothyroidism, unspecified: Secondary | ICD-10-CM | POA: Diagnosis not present

## 2015-07-09 DIAGNOSIS — N189 Chronic kidney disease, unspecified: Secondary | ICD-10-CM | POA: Diagnosis not present

## 2015-07-10 ENCOUNTER — Encounter: Payer: Self-pay | Admitting: Family Medicine

## 2015-07-10 ENCOUNTER — Ambulatory Visit (INDEPENDENT_AMBULATORY_CARE_PROVIDER_SITE_OTHER): Payer: Medicare Other | Admitting: Family Medicine

## 2015-07-10 VITALS — BP 146/72 | HR 74 | Resp 18 | Ht 68.0 in | Wt 179.1 lb

## 2015-07-10 DIAGNOSIS — R2681 Unsteadiness on feet: Secondary | ICD-10-CM

## 2015-07-10 DIAGNOSIS — M6281 Muscle weakness (generalized): Secondary | ICD-10-CM

## 2015-07-10 DIAGNOSIS — Z0000 Encounter for general adult medical examination without abnormal findings: Secondary | ICD-10-CM

## 2015-07-10 DIAGNOSIS — R262 Difficulty in walking, not elsewhere classified: Secondary | ICD-10-CM

## 2015-07-10 NOTE — Progress Notes (Signed)
Subjective:    Patient ID: Chase [name], male    DOB: 08-14-1931, 81 y.o.   MRN: [id number]  HPI Preventive Screening-Counseling & Management   Patient present here today for a Medicare annual wellness visit.   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 No  Over the past two weeks, have you felt little interest or pleasure in doing things? No  Have you lost interest or pleasure in daily life? No  Do you often feel hopeless? No  Do you cry easily over simple problems? No   Activities of Daily Living  In your present state of health, do you have any difficulty performing the following activities?  Driving?: No,  Some problems with driving distances Managing money?: yes Feeding yourself?:No Getting from bed to chair?: Yes hard time with transfers  Climbing a flight of stairs?: Yes, due to use of assistive device Preparing food and eating?:No Bathing or showering?: Yes, with putting on shoes and some other articles of clothing  Getting dressed?:Needs help Getting to the toilet?:No Using the toilet?:No Moving around from place to place?: Yes due to cva and use of assistive device  Fall Risk Assessment In the past year have you fallen or had a near fall?:yes Are you currently taking any medications that make you dizzy?:No   Hearing Difficulties: yes at times Do you often ask people to speak up or  repeat themselves?:seldom Do you experience ringing or noises in your ears?:No Do you have difficulty understanding soft or whispered voices?:seldom  Cognitive Testing  Alert? Yes Normal Appearance?Yes  Oriented to person? Yes Place? Yes  Time? Yes  Displays appropriate judgment?Yes  Can read the correct time from a watch face? yes Are you having problems remembering things?No  Advanced Directives have been discussed with the patient?Yes and brochure provided , full cpode   List the Names of Other Physician/Practitioners you currently use: caretteams updated    Indicate any recent Medical Services you may have received from other than [facility] providers in the past year (date may be approximate).   Assessment:    Annual Wellness Exam   Plan:    Patient Instructions (the written plan) was given to the patient.  Medicare Attestation  I have personally reviewed:  The patient's medical and social history  Their use of alcohol, tobacco or illicit drugs  Their current medications and supplements  The patient's functional ability including ADLs,fall risks, home safety risks, cognitive, and hearing and visual impairment  Diet and physical activities  Evidence for depression or mood disorders  The patient's weight, height, BMI, and visual acuity have been recorded in the chart. I have made referrals, counseling, and provided education to the patient based on review of the above and I have provided the patient with a written personalized care plan for preventive services.      Review of Systems     Objective:   Physical Exam  BP 146/72 mmHg  Pulse 74  Resp 18  Ht 5\' 8"  (1.727 m)  Wt 179 lb 1.9 oz (81.248 kg)  BMI 27.24 kg/m2  SpO2 98%       Assessment & Plan:  Medicare annual wellness visit, subsequent Annual exam as documented. Counseling done  re healthy lifestyle involving commitment to daily  Exercise as able, heart healthy  Low carb ,diet, and attaining healthy  weight.The importance of adequate sleep also discussed. Regular seat belt use and home safety, is also discussed. Due to high fall risk referral is made to pT for strengthening and gait training/ re training  Immunization and cancer screening needs are specifically addressed at this visit.   Difficulty walking Pt with unsteady gailt and high fall risk as well as limited mobility in the home as withnesed by his spouse, refer for PT for 4 weeks

## 2015-07-10 NOTE — Patient Instructions (Signed)
F/u in 4 months, call if you need me before  Please check on shingles vaccine    Handicap  Sticker today   PT at [facility] twice  Weekly for 4 weeks

## 2015-07-11 DIAGNOSIS — E039 Hypothyroidism, unspecified: Secondary | ICD-10-CM | POA: Diagnosis not present

## 2015-07-11 DIAGNOSIS — Z48815 Encounter for surgical aftercare following surgery on the digestive system: Secondary | ICD-10-CM | POA: Diagnosis not present

## 2015-07-11 DIAGNOSIS — I129 Hypertensive chronic kidney disease with stage 1 through stage 4 chronic kidney disease, or unspecified chronic kidney disease: Secondary | ICD-10-CM | POA: Diagnosis not present

## 2015-07-11 DIAGNOSIS — E119 Type 2 diabetes mellitus without complications: Secondary | ICD-10-CM | POA: Diagnosis not present

## 2015-07-11 DIAGNOSIS — N189 Chronic kidney disease, unspecified: Secondary | ICD-10-CM | POA: Diagnosis not present

## 2015-07-11 DIAGNOSIS — E785 Hyperlipidemia, unspecified: Secondary | ICD-10-CM | POA: Diagnosis not present

## 2015-07-14 DIAGNOSIS — Z0000 Encounter for general adult medical examination without abnormal findings: Secondary | ICD-10-CM | POA: Insufficient documentation

## 2015-07-14 NOTE — Assessment & Plan Note (Signed)
Pt with unsteady gailt and high fall risk as well as limited mobility in the home as withnesed by his spouse, refer for PT for 4 weeks

## 2015-07-14 NOTE — Assessment & Plan Note (Addendum)
Annual exam as documented. Counseling done  re healthy lifestyle involving commitment to daily  Exercise as able, heart healthy  Low carb ,diet, and attaining healthy weight.The importance of adequate sleep also discussed. Regular seat belt use and home safety, is also discussed. Due to high fall risk referral is made to pT for strengthening and gait training/ re training  Immunization and cancer screening needs are specifically addressed at this visit.

## 2015-07-18 DIAGNOSIS — N184 Chronic kidney disease, stage 4 (severe): Secondary | ICD-10-CM | POA: Diagnosis not present

## 2015-07-31 DIAGNOSIS — M6281 Muscle weakness (generalized): Secondary | ICD-10-CM | POA: Diagnosis not present

## 2015-07-31 DIAGNOSIS — R278 Other lack of coordination: Secondary | ICD-10-CM | POA: Diagnosis not present

## 2015-07-31 DIAGNOSIS — R2681 Unsteadiness on feet: Secondary | ICD-10-CM | POA: Diagnosis not present

## 2015-07-31 DIAGNOSIS — R262 Difficulty in walking, not elsewhere classified: Secondary | ICD-10-CM | POA: Diagnosis not present

## 2015-08-01 DIAGNOSIS — R278 Other lack of coordination: Secondary | ICD-10-CM | POA: Diagnosis not present

## 2015-08-01 DIAGNOSIS — R2681 Unsteadiness on feet: Secondary | ICD-10-CM | POA: Diagnosis not present

## 2015-08-01 DIAGNOSIS — R262 Difficulty in walking, not elsewhere classified: Secondary | ICD-10-CM | POA: Diagnosis not present

## 2015-08-01 DIAGNOSIS — N184 Chronic kidney disease, stage 4 (severe): Secondary | ICD-10-CM | POA: Diagnosis not present

## 2015-08-01 DIAGNOSIS — M6281 Muscle weakness (generalized): Secondary | ICD-10-CM | POA: Diagnosis not present

## 2015-08-02 ENCOUNTER — Telehealth: Payer: Self-pay | Admitting: *Deleted

## 2015-08-02 NOTE — Telephone Encounter (Signed)
Pt's wife called about pt's strips she LMOM during lunch and requesting a call back. Please advise [phone]

## 2015-08-05 ENCOUNTER — Telehealth: Payer: Self-pay | Admitting: *Deleted

## 2015-08-05 NOTE — Telephone Encounter (Signed)
Chase [name] called for pt stating he needs strips called into [facility] in [place].

## 2015-08-06 ENCOUNTER — Telehealth: Payer: Self-pay | Admitting: *Deleted

## 2015-08-06 NOTE — Telephone Encounter (Signed)
See next telephone message. 

## 2015-08-06 NOTE — Telephone Encounter (Signed)
Pt's wife called stating she has medication figured out

## 2015-08-06 NOTE — Telephone Encounter (Signed)
Spoke with wife and gave her the # for [employer].  She will contact them for help with testing supplies.  Brochure will be mailed as well.

## 2015-08-06 NOTE — Telephone Encounter (Signed)
Spoke with wife and she is asking for strips to go with a talking meter that they received in the mail.  Notified wife that I am not sure if [facility] carries this brand of strips.  Patient may have to switch meters.

## 2015-08-06 NOTE — Telephone Encounter (Signed)
Noted. And form from [place] awaiting signature.

## 2015-08-07 DIAGNOSIS — R278 Other lack of coordination: Secondary | ICD-10-CM | POA: Diagnosis not present

## 2015-08-07 DIAGNOSIS — R2681 Unsteadiness on feet: Secondary | ICD-10-CM | POA: Diagnosis not present

## 2015-08-07 DIAGNOSIS — M6281 Muscle weakness (generalized): Secondary | ICD-10-CM | POA: Diagnosis not present

## 2015-08-07 DIAGNOSIS — R262 Difficulty in walking, not elsewhere classified: Secondary | ICD-10-CM | POA: Diagnosis not present

## 2015-08-09 DIAGNOSIS — R2681 Unsteadiness on feet: Secondary | ICD-10-CM | POA: Diagnosis not present

## 2015-08-09 DIAGNOSIS — R262 Difficulty in walking, not elsewhere classified: Secondary | ICD-10-CM | POA: Diagnosis not present

## 2015-08-09 DIAGNOSIS — M6281 Muscle weakness (generalized): Secondary | ICD-10-CM | POA: Diagnosis not present

## 2015-08-09 DIAGNOSIS — R278 Other lack of coordination: Secondary | ICD-10-CM | POA: Diagnosis not present

## 2015-08-13 DIAGNOSIS — D509 Iron deficiency anemia, unspecified: Secondary | ICD-10-CM | POA: Diagnosis not present

## 2015-08-13 DIAGNOSIS — I10 Essential (primary) hypertension: Secondary | ICD-10-CM | POA: Diagnosis not present

## 2015-08-13 DIAGNOSIS — R809 Proteinuria, unspecified: Secondary | ICD-10-CM | POA: Diagnosis not present

## 2015-08-13 DIAGNOSIS — N184 Chronic kidney disease, stage 4 (severe): Secondary | ICD-10-CM | POA: Diagnosis not present

## 2015-08-13 DIAGNOSIS — D649 Anemia, unspecified: Secondary | ICD-10-CM | POA: Diagnosis not present

## 2015-08-14 DIAGNOSIS — R278 Other lack of coordination: Secondary | ICD-10-CM | POA: Diagnosis not present

## 2015-08-14 DIAGNOSIS — R262 Difficulty in walking, not elsewhere classified: Secondary | ICD-10-CM | POA: Diagnosis not present

## 2015-08-14 DIAGNOSIS — R2681 Unsteadiness on feet: Secondary | ICD-10-CM | POA: Diagnosis not present

## 2015-08-14 DIAGNOSIS — M6281 Muscle weakness (generalized): Secondary | ICD-10-CM | POA: Diagnosis not present

## 2015-08-16 DIAGNOSIS — M6281 Muscle weakness (generalized): Secondary | ICD-10-CM | POA: Diagnosis not present

## 2015-08-16 DIAGNOSIS — R262 Difficulty in walking, not elsewhere classified: Secondary | ICD-10-CM | POA: Diagnosis not present

## 2015-08-16 DIAGNOSIS — R278 Other lack of coordination: Secondary | ICD-10-CM | POA: Diagnosis not present

## 2015-08-16 DIAGNOSIS — R2681 Unsteadiness on feet: Secondary | ICD-10-CM | POA: Diagnosis not present

## 2015-08-19 DIAGNOSIS — E1165 Type 2 diabetes mellitus with hyperglycemia: Secondary | ICD-10-CM | POA: Diagnosis not present

## 2015-08-19 DIAGNOSIS — I10 Essential (primary) hypertension: Secondary | ICD-10-CM | POA: Diagnosis not present

## 2015-08-19 DIAGNOSIS — E038 Other specified hypothyroidism: Secondary | ICD-10-CM | POA: Diagnosis not present

## 2015-08-19 DIAGNOSIS — E782 Mixed hyperlipidemia: Secondary | ICD-10-CM | POA: Diagnosis not present

## 2015-08-20 ENCOUNTER — Telehealth: Payer: Self-pay | Admitting: *Deleted

## 2015-08-20 NOTE — Telephone Encounter (Signed)
Sallie called Courtney back for patient, [name] said they are waiting on the Diabetic Supply it could take 3 weeks or more. Sallie said if Loma Sousa has any questions to please call her

## 2015-08-20 NOTE — Telephone Encounter (Signed)
Chase [name] called [facility] for Chase [name] wanting to speak with a nurse about medications. Please advise [phone]

## 2015-08-21 DIAGNOSIS — L929 Granulomatous disorder of the skin and subcutaneous tissue, unspecified: Secondary | ICD-10-CM | POA: Diagnosis not present

## 2015-08-22 DIAGNOSIS — M6281 Muscle weakness (generalized): Secondary | ICD-10-CM | POA: Diagnosis not present

## 2015-08-22 DIAGNOSIS — R278 Other lack of coordination: Secondary | ICD-10-CM | POA: Diagnosis not present

## 2015-08-22 DIAGNOSIS — R2681 Unsteadiness on feet: Secondary | ICD-10-CM | POA: Diagnosis not present

## 2015-08-22 DIAGNOSIS — R262 Difficulty in walking, not elsewhere classified: Secondary | ICD-10-CM | POA: Diagnosis not present

## 2015-08-23 ENCOUNTER — Other Ambulatory Visit: Payer: Self-pay | Admitting: Family Medicine

## 2015-08-28 ENCOUNTER — Telehealth: Payer: Self-pay | Admitting: Family Medicine

## 2015-08-28 DIAGNOSIS — E785 Hyperlipidemia, unspecified: Secondary | ICD-10-CM | POA: Diagnosis not present

## 2015-08-28 DIAGNOSIS — E1122 Type 2 diabetes mellitus with diabetic chronic kidney disease: Secondary | ICD-10-CM | POA: Diagnosis not present

## 2015-08-28 DIAGNOSIS — I10 Essential (primary) hypertension: Secondary | ICD-10-CM | POA: Diagnosis not present

## 2015-08-28 DIAGNOSIS — E039 Hypothyroidism, unspecified: Secondary | ICD-10-CM | POA: Diagnosis not present

## 2015-08-28 NOTE — Telephone Encounter (Signed)
Called and left message for wife to return call.

## 2015-08-28 NOTE — Telephone Encounter (Signed)
Called and left message for wife notifying her that she can receive rx for strips and lancets that will be covered at a retail pharmacy.

## 2015-08-28 NOTE — Telephone Encounter (Signed)
Mr. Walcutt has been out of diabetic supplies for a month now, pharmacy is telling the patient they are waiting on an order from Dr. Moshe Cipro, please advise?

## 2015-08-29 DIAGNOSIS — D509 Iron deficiency anemia, unspecified: Secondary | ICD-10-CM | POA: Diagnosis not present

## 2015-08-29 DIAGNOSIS — M6281 Muscle weakness (generalized): Secondary | ICD-10-CM | POA: Diagnosis not present

## 2015-08-29 DIAGNOSIS — R262 Difficulty in walking, not elsewhere classified: Secondary | ICD-10-CM | POA: Diagnosis not present

## 2015-08-29 DIAGNOSIS — R2681 Unsteadiness on feet: Secondary | ICD-10-CM | POA: Diagnosis not present

## 2015-08-29 DIAGNOSIS — N184 Chronic kidney disease, stage 4 (severe): Secondary | ICD-10-CM | POA: Diagnosis not present

## 2015-08-29 DIAGNOSIS — R278 Other lack of coordination: Secondary | ICD-10-CM | POA: Diagnosis not present

## 2015-09-05 DIAGNOSIS — R278 Other lack of coordination: Secondary | ICD-10-CM | POA: Diagnosis not present

## 2015-09-05 DIAGNOSIS — R262 Difficulty in walking, not elsewhere classified: Secondary | ICD-10-CM | POA: Diagnosis not present

## 2015-09-05 DIAGNOSIS — M6281 Muscle weakness (generalized): Secondary | ICD-10-CM | POA: Diagnosis not present

## 2015-09-05 DIAGNOSIS — R2681 Unsteadiness on feet: Secondary | ICD-10-CM | POA: Diagnosis not present

## 2015-09-11 DIAGNOSIS — R278 Other lack of coordination: Secondary | ICD-10-CM | POA: Diagnosis not present

## 2015-09-11 DIAGNOSIS — R262 Difficulty in walking, not elsewhere classified: Secondary | ICD-10-CM | POA: Diagnosis not present

## 2015-09-11 DIAGNOSIS — R2681 Unsteadiness on feet: Secondary | ICD-10-CM | POA: Diagnosis not present

## 2015-09-11 DIAGNOSIS — M6281 Muscle weakness (generalized): Secondary | ICD-10-CM | POA: Diagnosis not present

## 2015-09-12 DIAGNOSIS — R2681 Unsteadiness on feet: Secondary | ICD-10-CM | POA: Diagnosis not present

## 2015-09-12 DIAGNOSIS — N184 Chronic kidney disease, stage 4 (severe): Secondary | ICD-10-CM | POA: Diagnosis not present

## 2015-09-12 DIAGNOSIS — R278 Other lack of coordination: Secondary | ICD-10-CM | POA: Diagnosis not present

## 2015-09-12 DIAGNOSIS — R262 Difficulty in walking, not elsewhere classified: Secondary | ICD-10-CM | POA: Diagnosis not present

## 2015-09-12 DIAGNOSIS — M6281 Muscle weakness (generalized): Secondary | ICD-10-CM | POA: Diagnosis not present

## 2015-09-12 DIAGNOSIS — D509 Iron deficiency anemia, unspecified: Secondary | ICD-10-CM | POA: Diagnosis not present

## 2015-09-14 ENCOUNTER — Other Ambulatory Visit: Payer: Self-pay | Admitting: Family Medicine

## 2015-09-18 DIAGNOSIS — R278 Other lack of coordination: Secondary | ICD-10-CM | POA: Diagnosis not present

## 2015-09-18 DIAGNOSIS — R262 Difficulty in walking, not elsewhere classified: Secondary | ICD-10-CM | POA: Diagnosis not present

## 2015-09-18 DIAGNOSIS — R2681 Unsteadiness on feet: Secondary | ICD-10-CM | POA: Diagnosis not present

## 2015-09-18 DIAGNOSIS — M6281 Muscle weakness (generalized): Secondary | ICD-10-CM | POA: Diagnosis not present

## 2015-09-19 DIAGNOSIS — R2681 Unsteadiness on feet: Secondary | ICD-10-CM | POA: Diagnosis not present

## 2015-09-19 DIAGNOSIS — R262 Difficulty in walking, not elsewhere classified: Secondary | ICD-10-CM | POA: Diagnosis not present

## 2015-09-19 DIAGNOSIS — M6281 Muscle weakness (generalized): Secondary | ICD-10-CM | POA: Diagnosis not present

## 2015-09-19 DIAGNOSIS — R278 Other lack of coordination: Secondary | ICD-10-CM | POA: Diagnosis not present

## 2015-09-26 ENCOUNTER — Ambulatory Visit (INDEPENDENT_AMBULATORY_CARE_PROVIDER_SITE_OTHER): Payer: Medicare Other

## 2015-09-26 DIAGNOSIS — N184 Chronic kidney disease, stage 4 (severe): Secondary | ICD-10-CM | POA: Diagnosis not present

## 2015-09-26 DIAGNOSIS — Z23 Encounter for immunization: Secondary | ICD-10-CM

## 2015-10-08 DIAGNOSIS — E1142 Type 2 diabetes mellitus with diabetic polyneuropathy: Secondary | ICD-10-CM | POA: Diagnosis not present

## 2015-10-08 DIAGNOSIS — B351 Tinea unguium: Secondary | ICD-10-CM | POA: Diagnosis not present

## 2015-10-10 DIAGNOSIS — N184 Chronic kidney disease, stage 4 (severe): Secondary | ICD-10-CM | POA: Diagnosis not present

## 2015-10-22 ENCOUNTER — Ambulatory Visit (INDEPENDENT_AMBULATORY_CARE_PROVIDER_SITE_OTHER): Payer: Medicare Other | Admitting: Urology

## 2015-10-22 DIAGNOSIS — N401 Enlarged prostate with lower urinary tract symptoms: Secondary | ICD-10-CM

## 2015-10-24 DIAGNOSIS — N184 Chronic kidney disease, stage 4 (severe): Secondary | ICD-10-CM | POA: Diagnosis not present

## 2015-10-24 DIAGNOSIS — D509 Iron deficiency anemia, unspecified: Secondary | ICD-10-CM | POA: Diagnosis not present

## 2015-11-07 DIAGNOSIS — D509 Iron deficiency anemia, unspecified: Secondary | ICD-10-CM | POA: Diagnosis not present

## 2015-11-07 DIAGNOSIS — N184 Chronic kidney disease, stage 4 (severe): Secondary | ICD-10-CM | POA: Diagnosis not present

## 2015-11-07 DIAGNOSIS — R809 Proteinuria, unspecified: Secondary | ICD-10-CM | POA: Diagnosis not present

## 2015-11-07 DIAGNOSIS — E559 Vitamin D deficiency, unspecified: Secondary | ICD-10-CM | POA: Diagnosis not present

## 2015-11-07 DIAGNOSIS — Z79899 Other long term (current) drug therapy: Secondary | ICD-10-CM | POA: Diagnosis not present

## 2015-11-12 ENCOUNTER — Encounter: Payer: Self-pay | Admitting: Family Medicine

## 2015-11-12 ENCOUNTER — Ambulatory Visit (INDEPENDENT_AMBULATORY_CARE_PROVIDER_SITE_OTHER): Payer: Medicare Other | Admitting: Family Medicine

## 2015-11-12 VITALS — BP 148/82 | HR 80 | Resp 18 | Ht 68.0 in | Wt 181.0 lb

## 2015-11-12 DIAGNOSIS — Z794 Long term (current) use of insulin: Secondary | ICD-10-CM

## 2015-11-12 DIAGNOSIS — E785 Hyperlipidemia, unspecified: Secondary | ICD-10-CM

## 2015-11-12 DIAGNOSIS — I10 Essential (primary) hypertension: Secondary | ICD-10-CM | POA: Diagnosis not present

## 2015-11-12 DIAGNOSIS — I6523 Occlusion and stenosis of bilateral carotid arteries: Secondary | ICD-10-CM | POA: Diagnosis not present

## 2015-11-12 DIAGNOSIS — G40209 Localization-related (focal) (partial) symptomatic epilepsy and epileptic syndromes with complex partial seizures, not intractable, without status epilepticus: Secondary | ICD-10-CM

## 2015-11-12 DIAGNOSIS — E1140 Type 2 diabetes mellitus with diabetic neuropathy, unspecified: Secondary | ICD-10-CM

## 2015-11-12 DIAGNOSIS — E669 Obesity, unspecified: Secondary | ICD-10-CM | POA: Diagnosis not present

## 2015-11-12 DIAGNOSIS — E1142 Type 2 diabetes mellitus with diabetic polyneuropathy: Secondary | ICD-10-CM | POA: Diagnosis not present

## 2015-11-12 LAB — GLUCOSE, POCT (MANUAL RESULT ENTRY): POC Glucose: 181 mg/dl — AB (ref 70–99)

## 2015-11-12 NOTE — Progress Notes (Signed)
Subjective:    Patient ID: Chase [name], male    DOB: 1931-11-20, 81 y.o.   MRN: [id number]  HPI   Chase [name]     MRN: [id number]      DOB: Mar 24, 1931   HPI Chase [name] is here for follow up and re-evaluation of chronic medical conditions, medication management and review of any available recent lab and radiology data.  Preventive health is updated, specifically  Cancer screening and Immunization.   Questions or concerns regarding consultations or procedures which the PT has had in the interim are  addressed. The PT denies any adverse reactions to current medications since the last visit.  There are no new concerns.  There are no specific complaints   ROS Denies recent fever or chills. Denies sinus pressure, nasal congestion, ear pain or sore throat. Denies chest congestion, productive cough or wheezing. Denies chest pains, palpitations and leg swelling Denies abdominal pain, nausea, vomiting,diarrhea or constipation.   Denies dysuria, frequency, hesitancy or incontinence. Denies joint pain, swelling and limitation in mobility. Denies headaches, seizures, numbness, or tingling. Denies depression, anxiety or insomnia. Denies skin break down or rash.   PE  BP 148/82 mmHg  Pulse 80  Resp 18  Ht 5\' 8"  (1.727 m)  Wt 181 lb 0.6 oz (82.119 kg)  BMI 27.53 kg/m2  SpO2 100%  Patient alert and oriented and in no cardiopulmonary distress.  HEENT: No facial asymmetry, EOMI,   oropharynx pink and moist.  Neck supple no JVD, no mass.  Chest: Clear to auscultation bilaterally.  CVS: S1, S2 no murmurs, no S3.Regular rate.  ABD: Soft non tender.   Ext: No edema  MS: Adequate ROM spine, shoulders, hips and knees.  Skin: Intact, no ulcerations or rash noted.  Psych: Good eye contact, normal affect. Memory intact not anxious or depressed appearing.  CNS: CN 2-12 intact, power,  normal throughout.no focal deficits noted.   Assessment & Plan   Essential hypertension,  benign Sub optimal control, no change in management DASH diet and commitment to daily physical activity for a minimum of 30 minutes discussed and encouraged, as a part of hypertension management. The importance of attaining a healthy weight is also discussed.  BP/Weight 12/13/2015 11/28/2015 11/22/2015 11/12/2015 07/10/2015 04/02/2015 [id number]  Systolic BP Q000111Q 0000000 [id number] 123456 123456 [id number] [id number]  Diastolic BP 70 80 65 82 72 60 68  Wt. (Lbs) 188 185 181 181.04 179.12 181.12 -  BMI 28.59 28.14 27.53 27.53 27.24 27.55 -        Hyperlipidemia Hyperlipidemia:Low fat diet discussed and encouraged.   Lipid Panel  Lab Results  Component Value Date   CHOL 142 04/02/2015   HDL 72 04/02/2015   LDLCALC 55 04/02/2015   TRIG 77 04/02/2015   CHOLHDL 2.0 04/02/2015   Updated lab needed at/ before next visit.     Obesity Deteriorated. Patient re-educated about  the importance of commitment to a  minimum of 150 minutes of exercise per week.  The importance of healthy food choices with portion control discussed. Encouraged to start a food diary, count calories and to consider  joining a support group. Sample diet sheets offered. Goals set by the patient for the next several months.   Weight /BMI 12/13/2015 11/28/2015 11/22/2015  WEIGHT 188 lb 185 lb 181 lb  HEIGHT 5\' 8"  5\' 8"  5\' 8"   BMI 28.59 kg/m2 28.14 kg/m2 27.53 kg/m2    Current exercise per week 40 minutes.   Complex partial seizure disorder Controlled,  no change in medication Followed by neurology  Controlled diabetes mellitus with diabetic neuropathy [facility]) Chase [name] is reminded of the importance of commitment to daily physical activity for 30 minutes or more, as able and the need to limit carbohydrate intake to 30 to 60 grams per meal to help with blood sugar control.   The need to take medication as prescribed, test blood sugar as directed, and to call between visits if there is a concern that blood sugar is uncontrolled is also  discussed.   Chase [name] is reminded of the importance of daily foot exam, annual eye examination, and good blood sugar, blood pressure and cholesterol control.  Diabetic Labs Latest Ref Rng 11/27/2015 11/12/2015 04/02/2015 12/06/2014 12/05/2014  HbA1c - 6 - 6.7(H) - -  Microalbumin Not estab mg/dL - 76.3 - - -  Micro/Creat Ratio <30 mcg/mg creat - 596(H) - - -  Chol 0 - 200 mg/dL - - 142 - -  HDL >=40 mg/dL - - 72 - -  Calc LDL 0 - 99 mg/dL - - 55 - -  Triglycerides <150 mg/dL - - 77 - -  Creatinine 0.50 - 1.35 mg/dL - - 2.18(H) 2.28(H) 2.40(H)   BP/Weight 12/13/2015 11/28/2015 11/22/2015 11/12/2015 07/10/2015 04/02/2015 [id number]  Systolic BP Q000111Q 0000000 [id number] 123456 123456 [id number] Q000111Q  Diastolic BP 70 80 65 82 72 60 68  Wt. (Lbs) 188 185 181 181.04 179.12 181.12 -  BMI 28.59 28.14 27.53 27.53 27.24 27.55 -   Foot/eye exam completion dates Latest Ref Rng 11/12/2015 06/12/2014  Eye Exam No Retinopathy - -  Foot exam Order - - -  Foot Form Completion - Done Done             Review of Systems     Objective:   Physical Exam        Assessment & Plan:

## 2015-11-12 NOTE — Patient Instructions (Signed)
F/u in 4 month, call if you need me before  You are improving!  Foot exam today is good  EAT breakfast at regular time every day, important for blood sugar control  No changes in medication  Stay AWAY from salt  Urine test sent today  Thanks for choosing [facility], we consider it a privelige to serve you.

## 2015-11-14 LAB — MICROALBUMIN / CREATININE URINE RATIO
Creatinine, Urine: 128 mg/dL (ref 20–370)
Microalb Creat Ratio: 596 mcg/mg creat — ABNORMAL HIGH (ref <30)
Microalb, Ur: 76.3 mg/dL

## 2015-11-15 ENCOUNTER — Encounter: Payer: Self-pay | Admitting: Vascular Surgery

## 2015-11-21 DIAGNOSIS — Z79899 Other long term (current) drug therapy: Secondary | ICD-10-CM | POA: Diagnosis not present

## 2015-11-21 DIAGNOSIS — R809 Proteinuria, unspecified: Secondary | ICD-10-CM | POA: Diagnosis not present

## 2015-11-21 DIAGNOSIS — D509 Iron deficiency anemia, unspecified: Secondary | ICD-10-CM | POA: Diagnosis not present

## 2015-11-21 DIAGNOSIS — N184 Chronic kidney disease, stage 4 (severe): Secondary | ICD-10-CM | POA: Diagnosis not present

## 2015-11-21 DIAGNOSIS — E559 Vitamin D deficiency, unspecified: Secondary | ICD-10-CM | POA: Diagnosis not present

## 2015-11-22 ENCOUNTER — Encounter: Payer: Self-pay | Admitting: Vascular Surgery

## 2015-11-22 ENCOUNTER — Ambulatory Visit (HOSPITAL_COMMUNITY)
Admission: RE | Admit: 2015-11-22 | Discharge: 2015-11-22 | Disposition: A | Discharge status: Home / Self Care | Payer: Medicare Other | Source: Ambulatory Visit | Attending: Vascular Surgery | Admitting: Vascular Surgery

## 2015-11-22 ENCOUNTER — Ambulatory Visit (INDEPENDENT_AMBULATORY_CARE_PROVIDER_SITE_OTHER): Payer: Medicare Other | Admitting: Vascular Surgery

## 2015-11-22 VITALS — BP 140/65 | HR 84 | Temp 98.3°F | Resp 16 | Ht 68.0 in | Wt 181.0 lb

## 2015-11-22 DIAGNOSIS — N184 Chronic kidney disease, stage 4 (severe): Secondary | ICD-10-CM | POA: Diagnosis not present

## 2015-11-22 DIAGNOSIS — I872 Venous insufficiency (chronic) (peripheral): Secondary | ICD-10-CM

## 2015-11-22 DIAGNOSIS — E1140 Type 2 diabetes mellitus with diabetic neuropathy, unspecified: Secondary | ICD-10-CM | POA: Diagnosis not present

## 2015-11-22 DIAGNOSIS — E785 Hyperlipidemia, unspecified: Secondary | ICD-10-CM | POA: Insufficient documentation

## 2015-11-22 DIAGNOSIS — I1310 Hypertensive heart and chronic kidney disease without heart failure, with stage 1 through stage 4 chronic kidney disease, or unspecified chronic kidney disease: Secondary | ICD-10-CM | POA: Insufficient documentation

## 2015-11-22 DIAGNOSIS — I6523 Occlusion and stenosis of bilateral carotid arteries: Secondary | ICD-10-CM

## 2015-11-22 NOTE — Progress Notes (Signed)
HISTORY AND PHYSICAL     CC:  Check up Referring Provider:  [name], MD  HPI: This is a 81 y.o. male who presents for follow up for his carotid disease.  He states that he continues to have some right arm weakness, but this has been unchanged.  He denies any speech difficulties or temporary blindness or hemiparesis otherwise.    He states that his wounds on his left leg have healed and he has not had any issues.  He is wearing his compression stockings sometimes.   He does continue to have BLE swelling.    He is on a CCB and hydralazine for blood pressure control.  He is on a baby aspirin and Plavix daily.  He takes insulin for his diabetes.  He is on synthroid for hypothyroidism.    Past Medical History  Diagnosis Date  . Peripheral vascular disease, unspecified ([place])   . Depressive disorder, not elsewhere classified   . Obesity   . Other and unspecified hyperlipidemia   . Osteoarthrosis, unspecified whether generalized or localized, lower leg   . Spondylosis of unspecified site without mention of myelopathy   . Spinal stenosis, unspecified region other than cervical   . Seizures ([place])   . Complete lesion of cervical spinal cord ([place]) 03/14/3012    Stable since 2006  . Lacunar stroke, acute ([place]) 03/14/2012  . Bradycardia 03/15/2012  . Diabetic neuropathy ([place])   . CKD (chronic kidney disease) stage 4, GFR 15-29 ml/min (HCC)   . Diabetes mellitus approx 1994  . Hypothyroidism approx 2000  . Hypertensive heart disease   . CVA (cerebrovascular accident) ([place]) 09/07/12    Past Surgical History  Procedure Laterality Date  . Kidney stones left x2  1975  . Kidney surgery      Ruptured left kidney 30 yrs ago  from a kidney stone  . Colonoscopy N/A 08/10/2013    Procedure: COLONOSCOPY;  Surgeon: Rogene Houston, MD;  Location: [facility];  Service: Endoscopy;  Laterality: N/A;  240    Allergies  Allergen Reactions  . Bayer Advanced Aspirin [Aspirin] Nausea And Vomiting    . Penicillins Nausea And Vomiting    Current Outpatient Prescriptions  Medication Sig Dispense Refill  . amLODipine (NORVASC) 10 MG tablet TAKE ONE TABLET BY MOUTH ONCE DAILY 30 tablet 2  . aspirin EC 81 MG tablet Take 1 tablet (81 mg total) by mouth daily. 150 tablet 2  . chlorthalidone (HYGROTON) 50 MG tablet Take 1 tablet (50 mg total) by mouth daily. 30 tablet 3  . clopidogrel (PLAVIX) 75 MG tablet TAKE ONE TABLET BY MOUTH ONCE DAILY 30 tablet 1  . hydrALAZINE (APRESOLINE) 50 MG tablet Take 1 tablet (50 mg total) by mouth 2 (two) times daily. 60 tablet 11  . insulin lispro protamine-insulin lispro (HUMALOG 75/25) (75-25) 100 UNIT/ML SUSP Inject 10 Units into the skin 2 (two) times daily. As directed    . levETIRAcetam (KEPPRA) 500 MG tablet Take 250 mg by mouth at bedtime.     Marland Kitchen levothyroxine (SYNTHROID, LEVOTHROID) 175 MCG tablet Take 175 mcg by mouth daily before breakfast.     . lovastatin (MEVACOR) 40 MG tablet TAKE ONE TABLET BY MOUTH AT BEDTIME 30 tablet 6  . Multiple Vitamins-Minerals (EYE VITAMINS PO) Take 1 tablet by mouth daily.     . tamsulosin (FLOMAX) 0.4 MG CAPS capsule Take 0.4 mg by mouth.    . torsemide (DEMADEX) 20 MG tablet     .  Vitamin D, Ergocalciferol, (DRISDOL) 50000 UNITS CAPS capsule Take 50,000 Units by mouth every 7 (seven) days. Takes on Sunday.     No current facility-administered medications for this visit.    Family History  Problem Relation Age of Onset  . Diabetes Mother   . Prostate cancer Father   . Diabetes Brother   . Diabetes Brother   . Hypertension Brother   . Hypertension Brother   . Hypertension Brother     Social History   Social History  . Marital Status: Married    Spouse Name: N/A  . Number of Children: 5  . Years of Education: N/A   Occupational History  . retired     Social History Main Topics  . Smoking status: Never Smoker   . Smokeless tobacco: Never Used  . Alcohol Use: No  . Drug Use: No  . Sexual Activity:  No   Other Topics Concern  . Not on file   Social History Narrative     ROS: [x]  Positive   [ ]  Negative   [ ]  All sytems reviewed and are negative  Cardiovascular: []  chest pain/pressure []  palpitations []  SOB lying flat []  DOE []  pain in legs while walking []  pain in feet when lying flat []  hx of DVT []  hx of phlebitis [x]  swelling in legs []  varicose veins  Pulmonary: []  productive cough []  asthma []  wheezing  Neurologic: []  weakness in []  arms []  legs []  numbness in []  arms []  legs [] difficulty speaking or slurred speech []  temporary loss of vision in one eye []  dizziness  Hematologic: []  bleeding problems []  problems with blood clotting easily  GI []  vomiting blood []  blood in stool  GU: []  burning with urination []  blood in urine  Psychiatric: []  hx of major depression  Integumentary: []  rashes []  ulcers  Constitutional: []  fever []  chills   PHYSICAL EXAMINATION:  Filed Vitals:   11/22/15 1408 11/22/15 1414  BP: 127/64 140/65  Pulse: 79 84  Temp: 98.3 F (36.8 C)   Resp: 16    Body mass index is 27.53 kg/(m^2).  General:  WDWN in NAD Gait: in wheelchair HENT: WNL, normocephalic Pulmonary: normal non-labored breathing , without Rales, rhonchi,  wheezing Cardiac: RRR, without  Murmurs, rubs or gallops; without carotid bruits Skin: without rashes Vascular Exam/Pulses:  Right Left  Radial 2+ (normal) 2+ (normal)  Ulnar 1+ (weak) 1+ (weak)  DP 2+ (normal) 2+ (normal)  PT Unable to palpate  Unable to palpate    Extremities: without ischemic changes, without Gangrene , without cellulitis; without open wounds; left leg wound completely healed.  Musculoskeletal: no muscle wasting or atrophy  Neurologic: A&O X 3; Appropriate Affect ; SENSATION: normal; MOTOR FUNCTION:  moving all extremities equally. Speech is fluent/normal   Non-Invasive Vascular Imaging:   Carotid duplex 11/22/15: -1-39% stenoses of bilateral proximal carotid  arteries -No significant change since previous exam   ASSESSMENT/PLAN:: 81 y.o. male with bilateral carotid artery stenosis   -pt carotid duplex is essentially unchanged from previous exam -he is continuing to do well and is asymptomatic -we will have him f/u in one year with carotid duplex. They will call sooner if he becomes symptomatic or develops new wounds. -his previous venous ulcer on left leg has completely healed.  He is only wearing his compression sometimes and not daily.  Dr. Bridgett Larsson has recommended that he continue to wear his compression socks daily and the importance of this.  If he and his wife  are having difficulty getting the 20-75mmHg stockings on, he may use over the counter mild compression.     Leontine Locket, PA-C [facility] [phone]  Clinic MD:  Pt seen and examined in conjunction with Dr. Bridgett Larsson  Addendum  I have independently interviewed and examined the patient, and I agree with the physician assistant's findings.  Annual carotid duplex at this point.  Adele Barthel, MD [facility] [place] [facility] Office: [phone] Pager: [phone]  11/22/2015, 3:28 PM

## 2015-11-25 NOTE — Addendum Note (Signed)
Addended by: Dorthula Rue L on: 11/25/2015 03:09 PM   Modules accepted: Orders

## 2015-11-27 ENCOUNTER — Ambulatory Visit: Payer: Self-pay | Admitting: "Endocrinology

## 2015-11-27 DIAGNOSIS — E1122 Type 2 diabetes mellitus with diabetic chronic kidney disease: Secondary | ICD-10-CM | POA: Diagnosis not present

## 2015-11-27 DIAGNOSIS — E038 Other specified hypothyroidism: Secondary | ICD-10-CM | POA: Diagnosis not present

## 2015-11-27 DIAGNOSIS — I10 Essential (primary) hypertension: Secondary | ICD-10-CM | POA: Diagnosis not present

## 2015-11-27 DIAGNOSIS — E782 Mixed hyperlipidemia: Secondary | ICD-10-CM | POA: Diagnosis not present

## 2015-11-27 LAB — HEMOGLOBIN A1C: Hemoglobin A1C: 6

## 2015-11-28 ENCOUNTER — Encounter: Payer: Self-pay | Admitting: Cardiovascular Disease

## 2015-11-28 ENCOUNTER — Ambulatory Visit (INDEPENDENT_AMBULATORY_CARE_PROVIDER_SITE_OTHER): Payer: Medicare Other | Admitting: Cardiovascular Disease

## 2015-11-28 VITALS — BP 158/80 | HR 69 | Ht 68.0 in | Wt 185.0 lb

## 2015-11-28 DIAGNOSIS — I10 Essential (primary) hypertension: Secondary | ICD-10-CM

## 2015-11-28 DIAGNOSIS — H34212 Partial retinal artery occlusion, left eye: Secondary | ICD-10-CM

## 2015-11-28 DIAGNOSIS — E7800 Pure hypercholesterolemia, unspecified: Secondary | ICD-10-CM

## 2015-11-28 DIAGNOSIS — I6523 Occlusion and stenosis of bilateral carotid arteries: Secondary | ICD-10-CM

## 2015-11-28 DIAGNOSIS — I452 Bifascicular block: Secondary | ICD-10-CM | POA: Diagnosis not present

## 2015-11-28 DIAGNOSIS — I872 Venous insufficiency (chronic) (peripheral): Secondary | ICD-10-CM | POA: Diagnosis not present

## 2015-11-28 NOTE — Patient Instructions (Signed)
Your physician wants you to follow-up in: [place] will receive a reminder letter in the mail two months in advance. If you don't receive a letter, please call our office to schedule the follow-up appointment.  Your physician recommends that you continue on your current medications as directed. Please refer to the Current Medication list given to you today.  Thank you for choosing [facility]!!

## 2015-11-28 NOTE — Progress Notes (Signed)
Patient ID: ERYK CHESLOCK, male   DOB: [birth year], 81 y.o.   MRN: [id number]      SUBJECTIVE: Chase [name] is an 81 yr old male with a PMH which includes CVA, CKD, hypothyroidism, HTN, hypercholesterolemia, and IDDM. Had a minimal troponin elevation in 11/2014 when hospitalized for acute cholecystitis. He denies chest pain and shortness of breath.  He has chronic lower extremity swelling and venous insufficiency and takes torsemide prn.  His wife says SBP runs 124-144. Did not take meds today.  Has not been hospitalized this year.  ECG performed in the office today demonstrates sinus rhythm with a right bundle branch block and left anterior fascicular block, PACs, and nonspecific T wave abnormality.    Review of Systems: As per "subjective", otherwise negative.  Allergies  Allergen Reactions  . Bayer Advanced Aspirin [Aspirin] Nausea And Vomiting  . Penicillins Nausea And Vomiting    Current Outpatient Prescriptions  Medication Sig Dispense Refill  . amLODipine (NORVASC) 10 MG tablet TAKE ONE TABLET BY MOUTH ONCE DAILY 30 tablet 2  . aspirin EC 81 MG tablet Take 1 tablet (81 mg total) by mouth daily. 150 tablet 2  . clopidogrel (PLAVIX) 75 MG tablet TAKE ONE TABLET BY MOUTH ONCE DAILY 30 tablet 1  . hydrALAZINE (APRESOLINE) 50 MG tablet Take 1 tablet (50 mg total) by mouth 2 (two) times daily. 60 tablet 11  . insulin lispro protamine-insulin lispro (HUMALOG 75/25) (75-25) 100 UNIT/ML SUSP Inject 10 Units into the skin 2 (two) times daily. As directed    . levETIRAcetam (KEPPRA) 500 MG tablet Take 250 mg by mouth at bedtime.     Marland Kitchen levothyroxine (SYNTHROID, LEVOTHROID) 175 MCG tablet Take 175 mcg by mouth daily before breakfast.     . lovastatin (MEVACOR) 40 MG tablet TAKE ONE TABLET BY MOUTH AT BEDTIME 30 tablet 6  . Multiple Vitamins-Minerals (EYE VITAMINS PO) Take 1 tablet by mouth daily.     . tamsulosin (FLOMAX) 0.4 MG CAPS capsule Take 0.4 mg by mouth.    . torsemide (DEMADEX) 20  MG tablet Take 20 mg by mouth once.     . Vitamin D, Ergocalciferol, (DRISDOL) 50000 UNITS CAPS capsule Take 50,000 Units by mouth every 7 (seven) days. Takes on Sunday.    . chlorthalidone (HYGROTON) 50 MG tablet Take 1 tablet (50 mg total) by mouth daily. 30 tablet 3   No current facility-administered medications for this visit.    Past Medical History  Diagnosis Date  . Peripheral vascular disease, unspecified ([place])   . Depressive disorder, not elsewhere classified   . Obesity   . Other and unspecified hyperlipidemia   . Osteoarthrosis, unspecified whether generalized or localized, lower leg   . Spondylosis of unspecified site without mention of myelopathy   . Spinal stenosis, unspecified region other than cervical   . Seizures ([place])   . Complete lesion of cervical spinal cord ([place]) 03/14/3012    Stable since 2006  . Lacunar stroke, acute ([place]) 03/14/2012  . Bradycardia 03/15/2012  . Diabetic neuropathy ([place])   . CKD (chronic kidney disease) stage 4, GFR 15-29 ml/min (HCC)   . Diabetes mellitus approx 1994  . Hypothyroidism approx 2000  . Hypertensive heart disease   . CVA (cerebrovascular accident) ([place]) 09/07/12    Past Surgical History  Procedure Laterality Date  . Kidney stones left x2  1975  . Kidney surgery      Ruptured left kidney 30 yrs ago  from a kidney stone  .  Colonoscopy N/A 08/10/2013    Procedure: COLONOSCOPY;  Surgeon: Rogene Houston, MD;  Location: [facility];  Service: Endoscopy;  Laterality: N/A;  240    Social History   Social History  . Marital Status: Married    Spouse Name: N/A  . Number of Children: 5  . Years of Education: N/A   Occupational History  . retired     Social History Main Topics  . Smoking status: Never Smoker   . Smokeless tobacco: Never Used  . Alcohol Use: No  . Drug Use: No  . Sexual Activity: No   Other Topics Concern  . Not on file   Social History Narrative     Filed Vitals:   11/28/15 1036  BP: 158/80    Pulse: 69  Height: 5\' 8"  (1.727 m)  Weight: 185 lb (83.915 kg)  SpO2: 98%    PHYSICAL EXAM General: NAD HEENT: Atraumatic. Neck: No JVD, no thyromegaly. Lungs: Clear to auscultation bilaterally with normal respiratory effort. CV: Nondisplaced PMI.  Regular rate and rhythm with premature contractions, normal S1/S2, no S3/S4, no murmur. No pretibial or periankle edema.  No carotid bruit.   Abdomen: Soft, nontender, no distention.  Neurologic: Alert and oriented.  Psych: Flat affect. Skin: Normal. Musculoskeletal: No gross deformities. Extremities: No clubbing or cyanosis.   ECG: Most recent ECG reviewed.      ASSESSMENT AND PLAN: 1. Essential HTN: Elevated today but reportedly normally well controlled, and he did not take meds today. Continue hydralazine 50 mg twice daily as he said he would not likely take a medication three times daily, and continue chlorthalidone 50 mg daily with close renal function monitoring, given his h/o CKD. Given the degree of left ventricular hypertrophy and moderate hypertensive retinopathy, I suspect his HTN is chronic and suboptimally controlled. Continue amlodipine 10 mg daily.   2. Left retinal arteriole cholesterol embolus: I will continue ASA 81 mg daily, in addition to his Plavix. Carotid artery disease is followed by vascular surgery.  3. Hypercholesterolemia: Continue lovastatin 40 mg daily for now.   4. CKD: Continue to monitor.  5. Carotid artery stenosis: Being monitored by vascular surgery.  Dispo: f/u 1 year.   Chase [name], M.D., F.A.C.C.

## 2015-12-05 ENCOUNTER — Other Ambulatory Visit: Payer: Self-pay | Admitting: "Endocrinology

## 2015-12-05 ENCOUNTER — Other Ambulatory Visit: Payer: Self-pay | Admitting: Cardiovascular Disease

## 2015-12-05 DIAGNOSIS — N184 Chronic kidney disease, stage 4 (severe): Secondary | ICD-10-CM | POA: Diagnosis not present

## 2015-12-05 DIAGNOSIS — E559 Vitamin D deficiency, unspecified: Secondary | ICD-10-CM | POA: Diagnosis not present

## 2015-12-05 DIAGNOSIS — Z79899 Other long term (current) drug therapy: Secondary | ICD-10-CM | POA: Diagnosis not present

## 2015-12-05 DIAGNOSIS — R809 Proteinuria, unspecified: Secondary | ICD-10-CM | POA: Diagnosis not present

## 2015-12-13 ENCOUNTER — Ambulatory Visit (INDEPENDENT_AMBULATORY_CARE_PROVIDER_SITE_OTHER): Payer: Medicare Other | Admitting: "Endocrinology

## 2015-12-13 ENCOUNTER — Encounter: Payer: Self-pay | Admitting: "Endocrinology

## 2015-12-13 VITALS — BP 150/70 | HR 73 | Ht 68.0 in | Wt 188.0 lb

## 2015-12-13 DIAGNOSIS — Z794 Long term (current) use of insulin: Secondary | ICD-10-CM

## 2015-12-13 DIAGNOSIS — E1165 Type 2 diabetes mellitus with hyperglycemia: Secondary | ICD-10-CM

## 2015-12-13 DIAGNOSIS — I10 Essential (primary) hypertension: Secondary | ICD-10-CM | POA: Diagnosis not present

## 2015-12-13 DIAGNOSIS — IMO0002 Reserved for concepts with insufficient information to code with codable children: Secondary | ICD-10-CM

## 2015-12-13 DIAGNOSIS — E1122 Type 2 diabetes mellitus with diabetic chronic kidney disease: Secondary | ICD-10-CM

## 2015-12-13 DIAGNOSIS — E785 Hyperlipidemia, unspecified: Secondary | ICD-10-CM

## 2015-12-13 DIAGNOSIS — N184 Chronic kidney disease, stage 4 (severe): Secondary | ICD-10-CM

## 2015-12-13 DIAGNOSIS — E038 Other specified hypothyroidism: Secondary | ICD-10-CM

## 2015-12-13 MED ORDER — LEVOTHYROXINE SODIUM 175 MCG PO TABS: 175.0000 ug | ORAL_TABLET | Freq: Every morning | ORAL | Status: DC

## 2015-12-13 NOTE — Progress Notes (Signed)
Subjective:    Patient ID: Chase [name], male    DOB: 12/19/1930, PCP Tula Nakayama, MD   Past Medical History  Diagnosis Date  . Peripheral vascular disease, unspecified ([place])   . Depressive disorder, not elsewhere classified   . Obesity   . Other and unspecified hyperlipidemia   . Osteoarthrosis, unspecified whether generalized or localized, lower leg   . Spondylosis of unspecified site without mention of myelopathy   . Spinal stenosis, unspecified region other than cervical   . Seizures ([place])   . Complete lesion of cervical spinal cord ([place]) 03/14/3012    Stable since 2006  . Lacunar stroke, acute ([place]) 03/14/2012  . Bradycardia 03/15/2012  . Diabetic neuropathy ([place])   . CKD (chronic kidney disease) stage 4, GFR 15-29 ml/min (HCC)   . Diabetes mellitus approx 1994  . Hypothyroidism approx 2000  . Hypertensive heart disease   . CVA (cerebrovascular accident) ([place]) 09/07/12   Past Surgical History  Procedure Laterality Date  . Kidney stones left x2  1975  . Kidney surgery      Ruptured left kidney 30 yrs ago  from a kidney stone  . Colonoscopy N/A 08/10/2013    Procedure: COLONOSCOPY;  Surgeon: Rogene Houston, MD;  Location: [facility];  Service: Endoscopy;  Laterality: N/A;  240   Social History   Social History  . Marital Status: Married    Spouse Name: N/A  . Number of Children: 5  . Years of Education: N/A   Occupational History  . retired     Social History Main Topics  . Smoking status: Never Smoker   . Smokeless tobacco: Never Used  . Alcohol Use: No  . Drug Use: No  . Sexual Activity: No   Other Topics Concern  . None   Social History Narrative   Outpatient Encounter Prescriptions as of 12/13/2015  Medication Sig  . amLODipine (NORVASC) 10 MG tablet TAKE ONE TABLET BY MOUTH ONCE DAILY  . aspirin EC 81 MG tablet Take 1 tablet (81 mg total) by mouth daily.  . chlorthalidone (HYGROTON) 50 MG tablet Take 1 tablet (50 mg total) by mouth daily.  .  clopidogrel (PLAVIX) 75 MG tablet TAKE ONE TABLET BY MOUTH ONCE DAILY  . hydrALAZINE (APRESOLINE) 50 MG tablet TAKE ONE TABLET BY MOUTH TWICE DAILY  . insulin lispro protamine-insulin lispro (HUMALOG 75/25) (75-25) 100 UNIT/ML SUSP Inject 10 Units into the skin 2 (two) times daily with a meal. With breakfast and supper when blood glucose is above 90  . levETIRAcetam (KEPPRA) 500 MG tablet Take 250 mg by mouth at bedtime.   Marland Kitchen levothyroxine (SYNTHROID, LEVOTHROID) 175 MCG tablet TAKE ONE TABLET BY MOUTH IN THE MORNING  . lovastatin (MEVACOR) 40 MG tablet TAKE ONE TABLET BY MOUTH AT BEDTIME  . Multiple Vitamins-Minerals (EYE VITAMINS PO) Take 1 tablet by mouth daily.   . tamsulosin (FLOMAX) 0.4 MG CAPS capsule Take 0.4 mg by mouth.  . torsemide (DEMADEX) 20 MG tablet Take 20 mg by mouth once.   . Vitamin D, Ergocalciferol, (DRISDOL) 50000 UNITS CAPS capsule Take 50,000 Units by mouth every 7 (seven) days. Takes on Sunday.   No facility-administered encounter medications on file as of 12/13/2015.   ALLERGIES: Allergies  Allergen Reactions  . Bayer Advanced Aspirin [Aspirin] Nausea And Vomiting  . Penicillins Nausea And Vomiting   VACCINATION STATUS: Immunization History  Administered Date(s) Administered  . H1N1 11/14/2008  . Influenza Split 10/07/2011, 09/08/2012  .  Influenza Whole 08/22/2007, 08/27/2010  . Influenza,inj,Quad PF,36+ Mos 08/22/2013, 09/26/2014, 09/26/2015  . Pneumococcal Conjugate-13 06/12/2014  . Pneumococcal Polysaccharide-23 05/21/2004  . Td 05/21/2004  . Tdap 10/07/2011    Diabetes He presents for his follow-up diabetic visit. He has type 2 diabetes mellitus. Onset time: He was diagnosed at approximate age of 42 years. His disease course has been improving. There are no hypoglycemic associated symptoms. Pertinent negatives for hypoglycemia include no confusion, headaches, pallor or seizures. There are no diabetic associated symptoms. Pertinent negatives for diabetes  include no chest pain, no fatigue, no polydipsia, no polyphagia, no polyuria and no weakness. There are no hypoglycemic complications. Symptoms are improving. Diabetic complications include nephropathy and PVD. Risk factors for coronary artery disease include diabetes mellitus, dyslipidemia, male sex, obesity and sedentary lifestyle. Current diabetic treatment includes insulin injections. He is compliant with treatment most of the time. His weight is stable. He is following a diabetic diet. When asked about meal planning, he reported none. He never participates in exercise. His home blood glucose trend is decreasing steadily. His breakfast blood glucose range is generally 140-180 mg/dl. His dinner blood glucose range is generally 140-180 mg/dl.  Hyperlipidemia This is a chronic problem. The current episode started more than 1 year ago. Pertinent negatives include no chest pain, myalgias or shortness of breath. Current antihyperlipidemic treatment includes statins.  Hypertension This is a chronic problem. The current episode started more than 1 year ago. Pertinent negatives include no chest pain, headaches, neck pain, palpitations or shortness of breath. Past treatments include direct vasodilators. Hypertensive end-organ damage includes PVD and a thyroid problem.  Thyroid Problem Presents for follow-up visit. Patient reports no constipation, diarrhea, fatigue or palpitations. The symptoms have been stable. Past treatments include levothyroxine. His past medical history is significant for hyperlipidemia.     Review of Systems  Constitutional: Negative for fever, chills, fatigue and unexpected weight change.  HENT: Negative for dental problem, mouth sores and trouble swallowing.   Eyes: Negative for visual disturbance.  Respiratory: Negative for cough, choking, chest tightness, shortness of breath and wheezing.   Cardiovascular: Negative for chest pain, palpitations and leg swelling.  Gastrointestinal:  Negative for nausea, vomiting, abdominal pain, diarrhea, constipation and abdominal distention.  Endocrine: Negative for polydipsia, polyphagia and polyuria.  Genitourinary: Negative for dysuria, urgency, hematuria and flank pain.  Musculoskeletal: Positive for gait problem. Negative for myalgias, back pain and neck pain.       Walks with a walker, due to disequilibrium and arthritis.  Skin: Negative for pallor, rash and wound.  Neurological: Negative for seizures, syncope, weakness, numbness and headaches.  Psychiatric/Behavioral: Negative.  Negative for confusion and dysphoric mood.    Objective:    BP 150/70 mmHg  Pulse 73  Ht 5\' 8"  (1.727 m)  Wt 188 lb (85.276 kg)  BMI 28.59 kg/m2  SpO2 100%  Wt Readings from Last 3 Encounters:  12/13/15 188 lb (85.276 kg)  11/28/15 185 lb (83.915 kg)  11/22/15 181 lb (82.101 kg)    Physical Exam  Constitutional: He is oriented to person, place, and time. He appears well-developed and well-nourished. He is cooperative. No distress.  HENT:  Head: Normocephalic and atraumatic.  Eyes: EOM are normal.  Neck: Normal range of motion. Neck supple. No tracheal deviation present. No thyromegaly present.  Cardiovascular: Normal rate, S1 normal, S2 normal and normal heart sounds.  Exam reveals no gallop.   No murmur heard. Pulses:      Dorsalis pedis pulses are 1+  on the right side, and 1+ on the left side.       Posterior tibial pulses are 1+ on the right side, and 1+ on the left side.  Pulmonary/Chest: Breath sounds normal. No respiratory distress. He has no wheezes.  Abdominal: Soft. Bowel sounds are normal. He exhibits no distension. There is no tenderness. There is no guarding and no CVA tenderness.  Musculoskeletal: He exhibits no edema.       Right shoulder: He exhibits no swelling and no deformity.       Right ankle: He exhibits swelling.       Left ankle: He exhibits swelling.       Right foot: There is swelling.       Left foot: There is  swelling.  Neurological: He is alert and oriented to person, place, and time. He has normal strength and normal reflexes. No cranial nerve deficit or sensory deficit. Gait normal.  Skin: Skin is warm and dry. No rash noted. No cyanosis. Nails show no clubbing.  Psychiatric: He has a normal mood and affect. His speech is normal. Cognition and memory are normal.  He has moderate cognitive deficit.     CMP ( most recent) CMP     Component Value Date/Time   NA 137 04/02/2015 1643   K 4.7 04/02/2015 1643   CL 106 04/02/2015 1643   CO2 22 04/02/2015 1643   GLUCOSE 166* 04/02/2015 1643   BUN 37* 04/02/2015 1643   CREATININE 2.18* 04/02/2015 1643   CREATININE 2.28* 12/06/2014 0536   CALCIUM 9.3 04/02/2015 1643   PROT 7.0 04/02/2015 1643   ALBUMIN 3.8 04/02/2015 1643   AST 20 04/02/2015 1643   ALT 17 04/02/2015 1643   ALKPHOS 96 04/02/2015 1643   BILITOT 0.3 04/02/2015 1643   GFRNONAA 27* 04/02/2015 1643   GFRNONAA 25* 12/06/2014 0536   GFRAA 32* 04/02/2015 1643   GFRAA 29* 12/06/2014 0536     Diabetic Labs (most recent): Lab Results  Component Value Date   HGBA1C 6.7* 04/02/2015   HGBA1C 8.4* 09/08/2012   HGBA1C 8.4* 03/15/2012     Lipid Panel ( most recent) Lipid Panel     Component Value Date/Time   CHOL 142 04/02/2015 1643   TRIG 77 04/02/2015 1643   HDL 72 04/02/2015 1643   CHOLHDL 2.0 04/02/2015 1643   VLDL 15 04/02/2015 1643   LDLCALC 55 04/02/2015 1643      Assessment & Plan:   1. Uncontrolled type 2 diabetes mellitus with stage 4 chronic kidney disease, with long-term current use of insulin (HCC) -his diabetes is  complicated by chronic kidney disease stage IV and patient remains at a high risk for more acute and chronic complications of diabetes which include CAD, CVA, CKD, retinopathy, and neuropathy. These are all discussed in detail with the patient.  Patient came with controlled glucose profile, and  recent A1c of 6 %.  Glucose logs and insulin  administration records pertaining to this visit,  to be scanned into patient's records.  Recent labs reviewed.   - I have re-counseled the patient on diet management   by adopting a carbohydrate restricted / protein rich  Diet.  - Suggestion is made for patient to avoid simple carbohydrates   from their diet including Cakes , Desserts, Ice Cream,  Soda (  diet and regular) , Sweet Tea , Candies,  Chips, Cookies, Artificial Sweeteners,   and "Sugar-free" Products .  This will help patient to have stable blood glucose  profile and potentially avoid unintended  Weight gain.  - Patient is advised to stick to a routine mealtimes to eat 3 meals  a day and avoid unnecessary snacks ( to snack only to correct hypoglycemia).  - I have approached patient with the following individualized plan to manage diabetes and patient agrees.  - Will continue Humalog 75/25 10 units BID associated with monitoring of BG at least bid.  - Patient is warned not to take insulin without proper monitoring per orders. -Adjustment parameters are given for hypo and hyperglycemia in writing. -Patient is encouraged to call clinic for blood glucose levels less than 70 or above 300 mg /dl. -Patient is not a candidate for metformin,SGLT2 inhibitors due to CKD.  - Patient specific target  for A1c; LDL, HDL, Triglycerides, and  Waist Circumference were discussed in detail.  2) BP/HTN: Controlled. Continue current medications. 3) Lipids/HPL:  continue statins. 4) hypothyroidism: He is clinically euthyroid, however his TSH is elevated at 8.66. I will continue levothyroxine 175 g by mouth every morning.  - We discussed about correct intake of levothyroxine, at fasting, with water, separated by at least 30 minutes from breakfast, and separated by more than 4 hours from calcium, iron, multivitamins, acid reflux medications (PPIs). -Patient is made aware of the fact that thyroid hormone replacement is needed for life, dose to be adjusted  by periodic monitoring of thyroid function tests.  5) Chronic Care/Health Maintenance:  -Patient is on Statin medications and encouraged to continue to follow up with Ophthalmology, Podiatrist at least yearly or according to recommendations, and advised to  stay away from smoking. I have recommended yearly flu vaccine and pneumonia vaccination at least every 5 years; he cannot exercise optimally due to deconditioning and disequilibrium, and  sleep for at least 7 hours a day.  - 25 minutes of time was spent on the care of this patient , 50% of which was applied for counseling on diabetes complications and their preventions.  - I advised patient to maintain close follow up with Tula Nakayama, MD for primary care needs.  Patient is asked to bring meter and  blood glucose logs during their next visit.   Follow up plan: -Return in about 4 months (around 04/11/2016) for diabetes, high blood pressure, high cholesterol, underactive thyroid, follow up with pre-visit labs, meter, and logs.  Glade Lloyd, MD Phone: [phone]  Fax: [phone]   12/13/2015, 2:35 PM

## 2015-12-16 NOTE — Assessment & Plan Note (Signed)
Chase [name] is reminded of the importance of commitment to daily physical activity for 30 minutes or more, as able and the need to limit carbohydrate intake to 30 to 60 grams per meal to help with blood sugar control.   The need to take medication as prescribed, test blood sugar as directed, and to call between visits if there is a concern that blood sugar is uncontrolled is also discussed.   Chase [name] is reminded of the importance of daily foot exam, annual eye examination, and good blood sugar, blood pressure and cholesterol control.  Diabetic Labs Latest Ref Rng 11/27/2015 11/12/2015 04/02/2015 12/06/2014 12/05/2014  HbA1c - 6 - 6.7(H) - -  Microalbumin Not estab mg/dL - 76.3 - - -  Micro/Creat Ratio <30 mcg/mg creat - 596(H) - - -  Chol 0 - 200 mg/dL - - 142 - -  HDL >=40 mg/dL - - 72 - -  Calc LDL 0 - 99 mg/dL - - 55 - -  Triglycerides <150 mg/dL - - 77 - -  Creatinine 0.50 - 1.35 mg/dL - - 2.18(H) 2.28(H) 2.40(H)   BP/Weight 12/13/2015 11/28/2015 11/22/2015 11/12/2015 07/10/2015 04/02/2015 [id number]  Systolic BP Q000111Q 0000000 [id number] [id number] Q000111Q  Diastolic BP 70 80 65 82 72 60 68  Wt. (Lbs) 188 185 181 181.04 179.12 181.12 -  BMI 28.59 28.14 27.53 27.53 27.24 27.55 -   Foot/eye exam completion dates Latest Ref Rng 11/12/2015 06/12/2014  Eye Exam No Retinopathy - -  Foot exam Order - - -  Foot Form Completion - Done Done

## 2015-12-16 NOTE — Assessment & Plan Note (Signed)
Hyperlipidemia:Low fat diet discussed and encouraged.   Lipid Panel  Lab Results  Component Value Date   CHOL 142 04/02/2015   HDL 72 04/02/2015   LDLCALC 55 04/02/2015   TRIG 77 04/02/2015   CHOLHDL 2.0 04/02/2015   Updated lab needed at/ before next visit.

## 2015-12-16 NOTE — Assessment & Plan Note (Signed)
Sub optimal control, no change in management DASH diet and commitment to daily physical activity for a minimum of 30 minutes discussed and encouraged, as a part of hypertension management. The importance of attaining a healthy weight is also discussed.  BP/Weight 12/13/2015 11/28/2015 11/22/2015 11/12/2015 07/10/2015 04/02/2015 [id number]  Systolic BP Q000111Q 0000000 [id number] [id number] [id number] Q000111Q  Diastolic BP 70 80 65 82 72 60 68  Wt. (Lbs) 188 185 181 181.04 179.12 181.12 -  BMI 28.59 28.14 27.53 27.53 27.24 27.55 -

## 2015-12-16 NOTE — Assessment & Plan Note (Signed)
Controlled, no change in medication Followed by neurology

## 2015-12-16 NOTE — Assessment & Plan Note (Signed)
Deteriorated. Patient re-educated about  the importance of commitment to a  minimum of 150 minutes of exercise per week.  The importance of healthy food choices with portion control discussed. Encouraged to start a food diary, count calories and to consider  joining a support group. Sample diet sheets offered. Goals set by the patient for the next several months.   Weight /BMI 12/13/2015 11/28/2015 11/22/2015  WEIGHT 188 lb 185 lb 181 lb  HEIGHT 5\' 8"  5\' 8"  5\' 8"   BMI 28.59 kg/m2 28.14 kg/m2 27.53 kg/m2    Current exercise per week 40 minutes.

## 2015-12-19 DIAGNOSIS — D509 Iron deficiency anemia, unspecified: Secondary | ICD-10-CM | POA: Diagnosis not present

## 2015-12-19 DIAGNOSIS — N184 Chronic kidney disease, stage 4 (severe): Secondary | ICD-10-CM | POA: Diagnosis not present

## 2016-01-02 DIAGNOSIS — N184 Chronic kidney disease, stage 4 (severe): Secondary | ICD-10-CM | POA: Diagnosis not present

## 2016-01-02 DIAGNOSIS — D509 Iron deficiency anemia, unspecified: Secondary | ICD-10-CM | POA: Diagnosis not present

## 2016-01-16 ENCOUNTER — Other Ambulatory Visit: Payer: Self-pay | Admitting: Family Medicine

## 2016-01-16 ENCOUNTER — Other Ambulatory Visit: Payer: Self-pay | Admitting: "Endocrinology

## 2016-01-16 DIAGNOSIS — E559 Vitamin D deficiency, unspecified: Secondary | ICD-10-CM | POA: Diagnosis not present

## 2016-01-16 DIAGNOSIS — N184 Chronic kidney disease, stage 4 (severe): Secondary | ICD-10-CM | POA: Diagnosis not present

## 2016-01-16 DIAGNOSIS — Z79899 Other long term (current) drug therapy: Secondary | ICD-10-CM | POA: Diagnosis not present

## 2016-01-16 DIAGNOSIS — R809 Proteinuria, unspecified: Secondary | ICD-10-CM | POA: Diagnosis not present

## 2016-01-30 DIAGNOSIS — D509 Iron deficiency anemia, unspecified: Secondary | ICD-10-CM | POA: Diagnosis not present

## 2016-01-30 DIAGNOSIS — E559 Vitamin D deficiency, unspecified: Secondary | ICD-10-CM | POA: Diagnosis not present

## 2016-01-30 DIAGNOSIS — R809 Proteinuria, unspecified: Secondary | ICD-10-CM | POA: Diagnosis not present

## 2016-01-30 DIAGNOSIS — Z79899 Other long term (current) drug therapy: Secondary | ICD-10-CM | POA: Diagnosis not present

## 2016-01-30 DIAGNOSIS — N184 Chronic kidney disease, stage 4 (severe): Secondary | ICD-10-CM | POA: Diagnosis not present

## 2016-02-04 DIAGNOSIS — L309 Dermatitis, unspecified: Secondary | ICD-10-CM | POA: Diagnosis not present

## 2016-02-04 DIAGNOSIS — E1142 Type 2 diabetes mellitus with diabetic polyneuropathy: Secondary | ICD-10-CM | POA: Diagnosis not present

## 2016-02-04 DIAGNOSIS — B351 Tinea unguium: Secondary | ICD-10-CM | POA: Diagnosis not present

## 2016-02-13 DIAGNOSIS — D509 Iron deficiency anemia, unspecified: Secondary | ICD-10-CM | POA: Diagnosis not present

## 2016-02-13 DIAGNOSIS — N184 Chronic kidney disease, stage 4 (severe): Secondary | ICD-10-CM | POA: Diagnosis not present

## 2016-02-24 DIAGNOSIS — L280 Lichen simplex chronicus: Secondary | ICD-10-CM | POA: Diagnosis not present

## 2016-02-27 DIAGNOSIS — N184 Chronic kidney disease, stage 4 (severe): Secondary | ICD-10-CM | POA: Diagnosis not present

## 2016-02-27 DIAGNOSIS — D509 Iron deficiency anemia, unspecified: Secondary | ICD-10-CM | POA: Diagnosis not present

## 2016-03-12 ENCOUNTER — Other Ambulatory Visit: Payer: Self-pay | Admitting: Family Medicine

## 2016-03-12 ENCOUNTER — Ambulatory Visit (INDEPENDENT_AMBULATORY_CARE_PROVIDER_SITE_OTHER): Payer: Medicare Other | Admitting: Family Medicine

## 2016-03-12 ENCOUNTER — Encounter: Payer: Self-pay | Admitting: Family Medicine

## 2016-03-12 VITALS — BP 178/86 | HR 84 | Resp 16 | Ht 68.0 in | Wt 184.0 lb

## 2016-03-12 DIAGNOSIS — E785 Hyperlipidemia, unspecified: Secondary | ICD-10-CM | POA: Diagnosis not present

## 2016-03-12 DIAGNOSIS — E559 Vitamin D deficiency, unspecified: Secondary | ICD-10-CM

## 2016-03-12 DIAGNOSIS — R2689 Other abnormalities of gait and mobility: Secondary | ICD-10-CM

## 2016-03-12 DIAGNOSIS — I10 Essential (primary) hypertension: Secondary | ICD-10-CM | POA: Diagnosis not present

## 2016-03-12 DIAGNOSIS — G40209 Localization-related (focal) (partial) symptomatic epilepsy and epileptic syndromes with complex partial seizures, not intractable, without status epilepticus: Secondary | ICD-10-CM

## 2016-03-12 DIAGNOSIS — D649 Anemia, unspecified: Secondary | ICD-10-CM | POA: Diagnosis not present

## 2016-03-12 DIAGNOSIS — E569 Vitamin deficiency, unspecified: Secondary | ICD-10-CM | POA: Diagnosis not present

## 2016-03-12 DIAGNOSIS — E0840 Diabetes mellitus due to underlying condition with diabetic neuropathy, unspecified: Secondary | ICD-10-CM

## 2016-03-12 DIAGNOSIS — I150 Renovascular hypertension: Secondary | ICD-10-CM | POA: Diagnosis not present

## 2016-03-12 DIAGNOSIS — Z1211 Encounter for screening for malignant neoplasm of colon: Secondary | ICD-10-CM | POA: Diagnosis not present

## 2016-03-12 LAB — CBC WITH DIFFERENTIAL/PLATELET
Basophils Absolute: 0 cells/uL (ref 0–200)
Basophils Relative: 0 %
Eosinophils Absolute: 290 cells/uL (ref 15–500)
Eosinophils Relative: 5 %
HCT: 32.7 % — ABNORMAL LOW (ref 38.5–50.0)
Hemoglobin: 10.6 g/dL — ABNORMAL LOW (ref 13.2–17.1)
Lymphocytes Relative: 24 %
Lymphs Abs: 1392 cells/uL (ref 850–3900)
MCH: 31.3 pg (ref 27.0–33.0)
MCHC: 32.4 g/dL (ref 32.0–36.0)
MCV: 96.5 fL (ref 80.0–100.0)
MPV: 10.5 fL (ref 7.5–12.5)
Monocytes Absolute: 696 cells/uL (ref 200–950)
Monocytes Relative: 12 %
Neutro Abs: 3422 cells/uL (ref 1500–7800)
Neutrophils Relative %: 59 %
Platelets: 189 10*3/uL (ref 140–400)
RBC: 3.39 MIL/uL — ABNORMAL LOW (ref 4.20–5.80)
RDW: 14 % (ref 11.0–15.0)
WBC: 5.8 10*3/uL (ref 3.8–10.8)

## 2016-03-12 LAB — POC HEMOCCULT BLD/STL (OFFICE/1-CARD/DIAGNOSTIC): Fecal Occult Blood, POC: NEGATIVE

## 2016-03-12 LAB — LIPID PANEL
Cholesterol: 172 mg/dL (ref 125–200)
HDL: 80 mg/dL (ref >=40)
LDL Cholesterol: 84 mg/dL (ref <130)
Total CHOL/HDL Ratio: 2.2 Ratio (ref <=5.0)
Triglycerides: 38 mg/dL (ref <150)
VLDL: 8 mg/dL (ref <30)

## 2016-03-12 LAB — COMPLETE METABOLIC PANEL WITH GFR
ALT: 32 U/L (ref 9–46)
AST: 32 U/L (ref 10–35)
Albumin: 3.8 g/dL (ref 3.6–5.1)
Alkaline Phosphatase: 80 U/L (ref 40–115)
BUN: 43 mg/dL — ABNORMAL HIGH (ref 7–25)
CO2: 20 mmol/L (ref 20–31)
Calcium: 8.7 mg/dL (ref 8.6–10.3)
Chloride: 111 mmol/L — ABNORMAL HIGH (ref 98–110)
Creat: 3.21 mg/dL — ABNORMAL HIGH (ref 0.70–1.11)
GFR, Est African American: 20 mL/min — ABNORMAL LOW (ref >=60)
GFR, Est Non African American: 17 mL/min — ABNORMAL LOW (ref >=60)
Glucose, Bld: 69 mg/dL (ref 65–99)
Potassium: 5.4 mmol/L — ABNORMAL HIGH (ref 3.5–5.3)
Sodium: 142 mmol/L (ref 135–146)
Total Bilirubin: 0.4 mg/dL (ref 0.2–1.2)
Total Protein: 6.9 g/dL (ref 6.1–8.1)

## 2016-03-12 NOTE — Patient Instructions (Signed)
Annual wellness first week in September, call if you need me before  Nurse BP check first week on May  You NEED two additional BP meds , amlodipine and chlorthalidone , and both are sent in  Labs today  Rectal today  Thank you  for choosing [facility]. We consider it a privelige to serve you.  Delivering excellent health care in a caring and  compassionate way is our goal.  Partnering with you,  so that together we can achieve this goal is our strategy.

## 2016-03-12 NOTE — Progress Notes (Signed)
Subjective:    Patient ID: Chase [name], male    DOB: 11/12/1931, 82 y.o.   MRN: [id number]  HPI    Chase [name]     MRN: [id number]      DOB: [birth year]   HPI Chase [name] is here for follow up and re-evaluation of chronic medical conditions, medication management and review of any available recent lab and radiology data.  Preventive health is updated, specifically  Cancer screening and Immunization.   Questions or concerns regarding consultations or procedures which the PT has had in the interim are  addressed. The PT denies any adverse reactions to current medications since the last visit.  There are no new concerns.  There are no specific complaints   ROS Denies recent fever or chills. Denies sinus pressure, nasal congestion, ear pain or sore throat. Denies chest congestion, productive cough or wheezing. Denies chest pains, palpitations and leg swelling Denies abdominal pain, nausea, vomiting,diarrhea or constipation.   Denies dysuria, frequency, hesitancy or incontinence. C/o  joint pain, swelling and limitation in mobility.and unsteady gait Denies headaches, seizures, numbness, or tingling. Denies depression, anxiety or insomnia. Denies skin break down or rash.   PE  BP 178/86 mmHg  Pulse 84  Resp 16  Ht 5\' 8"  (1.727 m)  Wt 184 lb (83.462 kg)  BMI 27.98 kg/m2  SpO2 97%  Patient alert and oriented and in no cardiopulmonary distress.  HEENT: No facial asymmetry, EOMI,   oropharynx pink and moist.  Neck decreased ROM no JVD, no mass.  Chest: Clear to auscultation bilaterally.  CVS: S1, S2 no murmurs, no S3.Regular rate.  ABD: Soft non tender. No  Organomegaly or mass palpable Rectal: no mass, heme negative stool. Prostate smooth and not enlarged  Ext: No edema  MS: Decreased  ROM spine, shoulders, hips and knees.  Skin: Intact, no ulcerations or rash noted.  Psych: Good eye contact, normal affect. Memory intact not anxious or depressed appearing.  CNS:  CN 2-12 intact, power,  normal throughout.no focal deficits noted.   Assessment & Plan   Renovascular hypertension Uncontrolled due to unintentional non compliance DASH diet and commitment to daily physical activity for a minimum of 30 minutes discussed and encouraged, as a part of hypertension management. The importance of attaining a healthy weight is also discussed.  BP/Weight 03/12/2016 12/13/2015 11/28/2015 11/22/2015 11/12/2015 07/10/2015 [id number]  Systolic BP 0000000 Q000111Q 0000000 [id number] 123456 123456 [id number]  Diastolic BP 86 70 80 65 82 72 60  Wt. (Lbs) 184 188 185 181 181.04 179.12 181.12  BMI 27.98 28.59 28.14 27.53 27.53 27.24 27.55      Nurse Bp check in 5 weeks  Hypothyroidism Followed byt endo  Controlled diabetes mellitus with diabetic neuropathy ([place]) Followed by endo and controlled well Chase [name] is reminded of the importance of commitment to daily physical activity for 30 minutes or more, as able and the need to limit carbohydrate intake to 30 to 60 grams per meal to help with blood sugar control.   The need to take medication as prescribed, test blood sugar as directed, and to call between visits if there is a concern that blood sugar is uncontrolled is also discussed.   Chase [name] is reminded of the importance of daily foot exam, annual eye examination, and good blood sugar, blood pressure and cholesterol control.  Diabetic Labs Latest Ref Rng 03/12/2016 11/27/2015 11/12/2015 04/02/2015 12/06/2014  HbA1c - - 6 - 6.7(H) -  Microalbumin Not estab mg/dL - - 76.3 - -  Micro/Creat Ratio <30 mcg/mg creat - - 596(H) - -  Chol 125 - 200 mg/dL 172 - - 142 -  HDL >=40 mg/dL 80 - - 72 -  Calc LDL <130 mg/dL 84 - - 55 -  Triglycerides <150 mg/dL 38 - - 77 -  Creatinine 0.70 - 1.11 mg/dL 3.21(H) - - 2.18(H) 2.28(H)   BP/Weight 03/12/2016 12/13/2015 11/28/2015 11/22/2015 11/12/2015 07/10/2015 [id number]  Systolic BP 0000000 Q000111Q 0000000 [id number] [id number] 123456 [id number]  Diastolic BP 86 70 80 65 82 72 60  Wt. (Lbs) 184 188 185 181  181.04 179.12 181.12  BMI 27.98 28.59 28.14 27.53 27.53 27.24 27.55   Foot/eye exam completion dates Latest Ref Rng 11/12/2015 06/12/2014  Eye Exam No Retinopathy - -  Foot exam Order - - -  Foot Form Completion - Done Done         Seizure disorder, complex partial ([place]) No reported seizure activity in over 12 months, maintained on kepra by neurology  Hyperlipidemia Hyperlipidemia:Low fat diet discussed and encouraged.   Lipid Panel  Lab Results  Component Value Date   CHOL 172 03/12/2016   HDL 80 03/12/2016   LDLCALC 84 03/12/2016   TRIG 38 03/12/2016   CHOLHDL 2.2 03/12/2016   Controlled, no change in medication      Poor balance Fall precautions discussed and home safety reviewed      Review of Systems     Objective:   Physical Exam        Assessment & Plan:

## 2016-03-13 LAB — VITAMIN D 25 HYDROXY (VIT D DEFICIENCY, FRACTURES): Vit D, 25-Hydroxy: 23 ng/mL — ABNORMAL LOW (ref 30–100)

## 2016-03-15 NOTE — Assessment & Plan Note (Signed)
Uncontrolled due to unintentional non compliance DASH diet and commitment to daily physical activity for a minimum of 30 minutes discussed and encouraged, as a part of hypertension management. The importance of attaining a healthy weight is also discussed.  BP/Weight 03/12/2016 12/13/2015 11/28/2015 11/22/2015 11/12/2015 07/10/2015 [id number]  Systolic BP 0000000 Q000111Q 0000000 [id number] 123456 123456 [id number]  Diastolic BP 86 70 80 65 82 72 60  Wt. (Lbs) 184 188 185 181 181.04 179.12 181.12  BMI 27.98 28.59 28.14 27.53 27.53 27.24 27.55      Nurse Bp check in 5 weeks

## 2016-03-15 NOTE — Assessment & Plan Note (Signed)
Followed byt endo

## 2016-03-15 NOTE — Assessment & Plan Note (Signed)
Followed by endo and controlled well Chase [name] is reminded of the importance of commitment to daily physical activity for 30 minutes or more, as able and the need to limit carbohydrate intake to 30 to 60 grams per meal to help with blood sugar control.   The need to take medication as prescribed, test blood sugar as directed, and to call between visits if there is a concern that blood sugar is uncontrolled is also discussed.   Chase [name] is reminded of the importance of daily foot exam, annual eye examination, and good blood sugar, blood pressure and cholesterol control.  Diabetic Labs Latest Ref Rng 03/12/2016 11/27/2015 11/12/2015 04/02/2015 12/06/2014  HbA1c - - 6 - 6.7(H) -  Microalbumin Not estab mg/dL - - 76.3 - -  Micro/Creat Ratio <30 mcg/mg creat - - 596(H) - -  Chol 125 - 200 mg/dL 172 - - 142 -  HDL >=40 mg/dL 80 - - 72 -  Calc LDL <130 mg/dL 84 - - 55 -  Triglycerides <150 mg/dL 38 - - 77 -  Creatinine 0.70 - 1.11 mg/dL 3.21(H) - - 2.18(H) 2.28(H)   BP/Weight 03/12/2016 12/13/2015 11/28/2015 11/22/2015 11/12/2015 07/10/2015 [id number]  Systolic BP 0000000 Q000111Q 0000000 [id number] [id number] 123456 [id number]  Diastolic BP 86 70 80 65 82 72 60  Wt. (Lbs) 184 188 185 181 181.04 179.12 181.12  BMI 27.98 28.59 28.14 27.53 27.53 27.24 27.55   Foot/eye exam completion dates Latest Ref Rng 11/12/2015 06/12/2014  Eye Exam No Retinopathy - -  Foot exam Order - - -  Foot Form Completion - Done Done

## 2016-03-15 NOTE — Assessment & Plan Note (Signed)
Fall precautions discussed and home safety reviewed

## 2016-03-15 NOTE — Assessment & Plan Note (Signed)
Hyperlipidemia:Low fat diet discussed and encouraged.   Lipid Panel  Lab Results  Component Value Date   CHOL 172 03/12/2016   HDL 80 03/12/2016   LDLCALC 84 03/12/2016   TRIG 38 03/12/2016   CHOLHDL 2.2 03/12/2016   Controlled, no change in medication

## 2016-03-15 NOTE — Assessment & Plan Note (Signed)
No reported seizure activity in over 12 months, maintained on kepra by neurology

## 2016-03-16 LAB — IRON AND TIBC
%SAT: 28 % (ref 15–60)
Iron: 68 ug/dL (ref 50–180)
TIBC: 242 ug/dL — ABNORMAL LOW (ref 250–425)
UIBC: 174 ug/dL (ref 125–400)

## 2016-03-16 LAB — FOLATE: Folate: 3.6 ng/mL — ABNORMAL LOW (ref >5.4)

## 2016-03-16 LAB — FERRITIN: Ferritin: 233 ng/mL (ref 20–380)

## 2016-03-16 LAB — VITAMIN B12: Vitamin B-12: 608 pg/mL (ref 200–1100)

## 2016-03-19 DIAGNOSIS — N184 Chronic kidney disease, stage 4 (severe): Secondary | ICD-10-CM | POA: Diagnosis not present

## 2016-03-25 ENCOUNTER — Other Ambulatory Visit: Payer: Self-pay | Admitting: Family Medicine

## 2016-03-26 ENCOUNTER — Other Ambulatory Visit: Payer: Self-pay

## 2016-03-26 ENCOUNTER — Telehealth: Payer: Self-pay | Admitting: Family Medicine

## 2016-03-26 DIAGNOSIS — I10 Essential (primary) hypertension: Secondary | ICD-10-CM

## 2016-03-26 MED ORDER — CHLORTHALIDONE 50 MG PO TABS: 50.0000 mg | ORAL_TABLET | Freq: Every day | ORAL | Status: DC

## 2016-03-26 MED ORDER — FOLIC ACID 1 MG PO TABS: 1.0000 mg | ORAL_TABLET | Freq: Every day | ORAL | Status: DC

## 2016-03-26 MED ORDER — AMLODIPINE BESYLATE 10 MG PO TABS: 10.0000 mg | ORAL_TABLET | Freq: Every day | ORAL | Status: DC

## 2016-03-26 MED ORDER — VITAMIN D (ERGOCALCIFEROL) 1.25 MG (50000 UNIT) PO CAPS: 50000.0000 [IU] | ORAL_CAPSULE | ORAL | Status: DC

## 2016-03-26 NOTE — Telephone Encounter (Signed)
meds resent

## 2016-03-26 NOTE — Telephone Encounter (Signed)
Chase [name] went to pick up Chase [name] medication and nothing was at the [facility], He is also out of his Vitamin D, please advise?

## 2016-04-02 DIAGNOSIS — D509 Iron deficiency anemia, unspecified: Secondary | ICD-10-CM | POA: Diagnosis not present

## 2016-04-02 DIAGNOSIS — N184 Chronic kidney disease, stage 4 (severe): Secondary | ICD-10-CM | POA: Diagnosis not present

## 2016-04-07 ENCOUNTER — Ambulatory Visit: Payer: Medicare Other

## 2016-04-07 VITALS — BP 134/64

## 2016-04-07 DIAGNOSIS — I10 Essential (primary) hypertension: Secondary | ICD-10-CM

## 2016-04-07 NOTE — Progress Notes (Signed)
Patient in for nurse blood pressure check.  Blood pressure checked manually in right arm.  Reading within normal range.  Patient will continue prescribed medications and keep next scheduled office visit. Marland Kitchen

## 2016-04-13 ENCOUNTER — Other Ambulatory Visit: Payer: Self-pay | Admitting: "Endocrinology

## 2016-04-13 DIAGNOSIS — E1165 Type 2 diabetes mellitus with hyperglycemia: Secondary | ICD-10-CM | POA: Diagnosis not present

## 2016-04-13 DIAGNOSIS — E1122 Type 2 diabetes mellitus with diabetic chronic kidney disease: Secondary | ICD-10-CM | POA: Diagnosis not present

## 2016-04-13 DIAGNOSIS — N184 Chronic kidney disease, stage 4 (severe): Secondary | ICD-10-CM | POA: Diagnosis not present

## 2016-04-13 DIAGNOSIS — Z794 Long term (current) use of insulin: Secondary | ICD-10-CM | POA: Diagnosis not present

## 2016-04-13 DIAGNOSIS — E038 Other specified hypothyroidism: Secondary | ICD-10-CM | POA: Diagnosis not present

## 2016-04-13 LAB — BASIC METABOLIC PANEL
BUN: 49 mg/dL — ABNORMAL HIGH (ref 7–25)
CO2: 21 mmol/L (ref 20–31)
Calcium: 9 mg/dL (ref 8.6–10.3)
Chloride: 109 mmol/L (ref 98–110)
Creat: 3.23 mg/dL — ABNORMAL HIGH (ref 0.70–1.11)
Glucose, Bld: 104 mg/dL — ABNORMAL HIGH (ref 65–99)
Potassium: 4.4 mmol/L (ref 3.5–5.3)
Sodium: 140 mmol/L (ref 135–146)

## 2016-04-13 LAB — HEMOGLOBIN A1C
Hgb A1c MFr Bld: 6 % — ABNORMAL HIGH (ref <5.7)
Mean Plasma Glucose: 126 mg/dL

## 2016-04-14 LAB — T4, FREE: Free T4: 1.1 ng/dL (ref 0.8–1.8)

## 2016-04-14 LAB — TSH: TSH: 8.32 mIU/L — ABNORMAL HIGH (ref 0.40–4.50)

## 2016-04-16 ENCOUNTER — Encounter: Payer: Self-pay | Admitting: "Endocrinology

## 2016-04-16 ENCOUNTER — Ambulatory Visit (INDEPENDENT_AMBULATORY_CARE_PROVIDER_SITE_OTHER): Payer: Medicare Other | Admitting: "Endocrinology

## 2016-04-16 VITALS — BP 146/79 | HR 70 | Ht 68.0 in

## 2016-04-16 DIAGNOSIS — N184 Chronic kidney disease, stage 4 (severe): Secondary | ICD-10-CM

## 2016-04-16 DIAGNOSIS — R809 Proteinuria, unspecified: Secondary | ICD-10-CM | POA: Diagnosis not present

## 2016-04-16 DIAGNOSIS — E1165 Type 2 diabetes mellitus with hyperglycemia: Secondary | ICD-10-CM | POA: Diagnosis not present

## 2016-04-16 DIAGNOSIS — E1122 Type 2 diabetes mellitus with diabetic chronic kidney disease: Secondary | ICD-10-CM

## 2016-04-16 DIAGNOSIS — E038 Other specified hypothyroidism: Secondary | ICD-10-CM | POA: Diagnosis not present

## 2016-04-16 DIAGNOSIS — I10 Essential (primary) hypertension: Secondary | ICD-10-CM

## 2016-04-16 DIAGNOSIS — E559 Vitamin D deficiency, unspecified: Secondary | ICD-10-CM | POA: Diagnosis not present

## 2016-04-16 DIAGNOSIS — D509 Iron deficiency anemia, unspecified: Secondary | ICD-10-CM | POA: Diagnosis not present

## 2016-04-16 DIAGNOSIS — E785 Hyperlipidemia, unspecified: Secondary | ICD-10-CM

## 2016-04-16 DIAGNOSIS — Z79899 Other long term (current) drug therapy: Secondary | ICD-10-CM | POA: Diagnosis not present

## 2016-04-16 NOTE — Progress Notes (Signed)
Subjective:    Patient ID: Chase [name], male    DOB: 04-14-1931, PCP Tula Nakayama, MD   Past Medical History  Diagnosis Date  . Peripheral vascular disease, unspecified ([place])   . Depressive disorder, not elsewhere classified   . Obesity   . Other and unspecified hyperlipidemia   . Osteoarthrosis, unspecified whether generalized or localized, lower leg   . Spondylosis of unspecified site without mention of myelopathy   . Spinal stenosis, unspecified region other than cervical   . Seizures ([place])   . Complete lesion of cervical spinal cord ([place]) 03/14/3012    Stable since 2006  . Lacunar stroke, acute ([place]) 03/14/2012  . Bradycardia 03/15/2012  . Diabetic neuropathy ([place])   . CKD (chronic kidney disease) stage 4, GFR 15-29 ml/min ([facility])   . Diabetes mellitus approx 1994  . Hypothyroidism approx 2000  . Hypertensive heart disease   . CVA (cerebrovascular accident) ([place]) 09/07/12   Past Surgical History  Procedure Laterality Date  . Kidney stones left x2  1975  . Kidney surgery      Ruptured left kidney 30 yrs ago  from a kidney stone  . Colonoscopy N/A 08/10/2013    Procedure: COLONOSCOPY;  Surgeon: Rogene Houston, MD;  Location: [facility];  Service: Endoscopy;  Laterality: N/A;  240   Social History   Social History  . Marital Status: Married    Spouse Name: N/A  . Number of Children: 5  . Years of Education: N/A   Occupational History  . retired     Social History Main Topics  . Smoking status: Never Smoker   . Smokeless tobacco: Never Used  . Alcohol Use: No  . Drug Use: No  . Sexual Activity: No   Other Topics Concern  . None   Social History Narrative   Outpatient Encounter Prescriptions as of 04/16/2016  Medication Sig  . amLODipine (NORVASC) 10 MG tablet Take 1 tablet (10 mg total) by mouth daily.  Marland Kitchen aspirin EC 81 MG tablet Take 1 tablet (81 mg total) by mouth daily.  . chlorthalidone (HYGROTON) 50 MG tablet Take 1 tablet (50 mg total) by mouth  daily.  . clopidogrel (PLAVIX) 75 MG tablet TAKE ONE TABLET BY MOUTH ONCE DAILY  . folic acid (FOLVITE) 1 MG tablet Take 1 tablet (1 mg total) by mouth daily.  . hydrALAZINE (APRESOLINE) 50 MG tablet TAKE ONE TABLET BY MOUTH TWICE DAILY  . insulin lispro protamine-insulin lispro (HUMALOG 75/25) (75-25) 100 UNIT/ML SUSP Inject 10 Units into the skin 2 (two) times daily with a meal. With breakfast and supper when blood glucose is above 90  . levETIRAcetam (KEPPRA) 500 MG tablet Take 250 mg by mouth at bedtime.   Marland Kitchen levothyroxine (SYNTHROID, LEVOTHROID) 175 MCG tablet Take 1 tablet (175 mcg total) by mouth every morning.  . lovastatin (MEVACOR) 40 MG tablet TAKE ONE TABLET BY MOUTH AT BEDTIME  . Multiple Vitamins-Minerals (EYE VITAMINS PO) Take 1 tablet by mouth daily.   . tamsulosin (FLOMAX) 0.4 MG CAPS capsule Take 0.4 mg by mouth.  . torsemide (DEMADEX) 20 MG tablet Take 20 mg by mouth once.   . Vitamin D, Ergocalciferol, (DRISDOL) 50000 units CAPS capsule Take 1 capsule (50,000 Units total) by mouth once a week.   No facility-administered encounter medications on file as of 04/16/2016.   ALLERGIES: Allergies  Allergen Reactions  . Bayer Advanced Aspirin [Aspirin] Nausea And Vomiting  . Penicillins Nausea And Vomiting  VACCINATION STATUS: Immunization History  Administered Date(s) Administered  . H1N1 11/14/2008  . Influenza Split 10/07/2011, 09/08/2012  . Influenza Whole 08/22/2007, 08/27/2010  . Influenza,inj,Quad PF,36+ Mos 08/22/2013, 09/26/2014, 09/26/2015  . Pneumococcal Conjugate-13 06/12/2014  . Pneumococcal Polysaccharide-23 05/21/2004  . Td 05/21/2004  . Tdap 10/07/2011    Diabetes He presents for his follow-up diabetic visit. He has type 2 diabetes mellitus. Onset time: He was diagnosed at approximate age of 52 years. His disease course has been stable. There are no hypoglycemic associated symptoms. Pertinent negatives for hypoglycemia include no confusion, headaches,  pallor or seizures. There are no diabetic associated symptoms. Pertinent negatives for diabetes include no chest pain, no fatigue, no polydipsia, no polyphagia, no polyuria and no weakness. There are no hypoglycemic complications. Symptoms are stable. Diabetic complications include nephropathy and PVD. Risk factors for coronary artery disease include diabetes mellitus, dyslipidemia, male sex, obesity and sedentary lifestyle. Current diabetic treatment includes insulin injections. He is compliant with treatment most of the time. His weight is stable. He is following a diabetic diet. When asked about meal planning, he reported none. He never participates in exercise. His home blood glucose trend is decreasing steadily. His breakfast blood glucose range is generally 140-180 mg/dl. His dinner blood glucose range is generally 140-180 mg/dl.  Hyperlipidemia This is a chronic problem. The current episode started more than 1 year ago. Pertinent negatives include no chest pain, myalgias or shortness of breath. Current antihyperlipidemic treatment includes statins.  Hypertension This is a chronic problem. The current episode started more than 1 year ago. Pertinent negatives include no chest pain, headaches, neck pain, palpitations or shortness of breath. Past treatments include direct vasodilators. Hypertensive end-organ damage includes PVD and a thyroid problem.  Thyroid Problem Presents for follow-up visit. Patient reports no constipation, diarrhea, fatigue or palpitations. The symptoms have been stable. Past treatments include levothyroxine. His past medical history is significant for hyperlipidemia.     Review of Systems  Constitutional: Negative for fever, chills, fatigue and unexpected weight change.  HENT: Negative for dental problem, mouth sores and trouble swallowing.   Eyes: Negative for visual disturbance.  Respiratory: Negative for cough, choking, chest tightness, shortness of breath and wheezing.    Cardiovascular: Negative for chest pain, palpitations and leg swelling.  Gastrointestinal: Negative for nausea, vomiting, abdominal pain, diarrhea, constipation and abdominal distention.  Endocrine: Negative for polydipsia, polyphagia and polyuria.  Genitourinary: Negative for dysuria, urgency, hematuria and flank pain.  Musculoskeletal: Positive for gait problem. Negative for myalgias, back pain and neck pain.       Walks with a walker, due to disequilibrium and arthritis.  Skin: Negative for pallor, rash and wound.  Neurological: Negative for seizures, syncope, weakness, numbness and headaches.  Psychiatric/Behavioral: Negative.  Negative for confusion and dysphoric mood.    Objective:    BP 146/79 mmHg  Pulse 70  Ht 5\' 8"  (1.727 m)  SpO2 98%  Wt Readings from Last 3 Encounters:  03/12/16 184 lb (83.462 kg)  12/13/15 188 lb (85.276 kg)  11/28/15 185 lb (83.915 kg)    Physical Exam  Constitutional: He is oriented to person, place, and time. He appears well-developed and well-nourished. He is cooperative. No distress.  HENT:  Head: Normocephalic and atraumatic.  Eyes: EOM are normal.  Neck: Normal range of motion. Neck supple. No tracheal deviation present. No thyromegaly present.  Cardiovascular: Normal rate, S1 normal, S2 normal and normal heart sounds.  Exam reveals no gallop.   No murmur heard. Pulses:  Dorsalis pedis pulses are 1+ on the right side, and 1+ on the left side.       Posterior tibial pulses are 1+ on the right side, and 1+ on the left side.  Pulmonary/Chest: Breath sounds normal. No respiratory distress. He has no wheezes.  Abdominal: Soft. Bowel sounds are normal. He exhibits no distension. There is no tenderness. There is no guarding and no CVA tenderness.  Musculoskeletal: He exhibits no edema.       Right shoulder: He exhibits no swelling and no deformity.       Right ankle: He exhibits swelling.       Left ankle: He exhibits swelling.       Right  foot: There is swelling.       Left foot: There is swelling.  Neurological: He is alert and oriented to person, place, and time. He has normal strength and normal reflexes. No cranial nerve deficit or sensory deficit. Gait normal.  Skin: Skin is warm and dry. No rash noted. No cyanosis. Nails show no clubbing.  Psychiatric: He has a normal mood and affect. His speech is normal. Cognition and memory are normal.  He has moderate cognitive deficit.     CMP ( most recent) CMP     Component Value Date/Time   NA 140 04/13/2016 1148   K 4.4 04/13/2016 1148   CL 109 04/13/2016 1148   CO2 21 04/13/2016 1148   GLUCOSE 104* 04/13/2016 1148   BUN 49* 04/13/2016 1148   CREATININE 3.23* 04/13/2016 1148   CREATININE 2.28* 12/06/2014 0536   CALCIUM 9.0 04/13/2016 1148   PROT 6.9 03/12/2016 1139   ALBUMIN 3.8 03/12/2016 1139   AST 32 03/12/2016 1139   ALT 32 03/12/2016 1139   ALKPHOS 80 03/12/2016 1139   BILITOT 0.4 03/12/2016 1139   GFRNONAA 17* 03/12/2016 1139   GFRNONAA 25* 12/06/2014 0536   GFRAA 20* 03/12/2016 1139   GFRAA 29* 12/06/2014 0536     Diabetic Labs (most recent): Lab Results  Component Value Date   HGBA1C 6.0* 04/13/2016   HGBA1C 6 11/27/2015   HGBA1C 6.7* 04/02/2015     Lipid Panel ( most recent) Lipid Panel     Component Value Date/Time   CHOL 172 03/12/2016 1139   TRIG 38 03/12/2016 1139   HDL 80 03/12/2016 1139   CHOLHDL 2.2 03/12/2016 1139   VLDL 8 03/12/2016 1139   LDLCALC 84 03/12/2016 1139      Assessment & Plan:   1.  type 2 diabetes mellitus with stage 4 chronic kidney disease, with long-term current use of insulin ([place]) -his diabetes is  complicated by chronic kidney disease stage IV and patient remains at a high risk for more acute and chronic complications of diabetes which include CAD, CVA, CKD, retinopathy, and neuropathy. These are all discussed in detail with the patient.  Patient came with controlled glucose profile, and  recent A1c of 6  %.  Glucose logs and insulin administration records pertaining to this visit,  to be scanned into patient's records.  Recent labs reviewed.   - I have re-counseled the patient on diet management   by adopting a carbohydrate restricted / protein rich  Diet.  - Suggestion is made for patient to avoid simple carbohydrates   from their diet including Cakes , Desserts, Ice Cream,  Soda (  diet and regular) , Sweet Tea , Candies,  Chips, Cookies, Artificial Sweeteners,   and "Sugar-free" Products .  This will help patient  to have stable blood glucose profile and potentially avoid unintended  Weight gain.  - Patient is advised to stick to a routine mealtimes to eat 3 meals  a day and avoid unnecessary snacks ( to snack only to correct hypoglycemia).  - I have approached patient with the following individualized plan to manage diabetes and patient agrees.  - Will continue Humalog 75/25 10 units BID associated with monitoring of BG at least bid.  - Patient is warned not to take insulin without proper monitoring per orders. -Adjustment parameters are given for hypo and hyperglycemia in writing. -Patient is encouraged to call clinic for blood glucose levels less than 70 or above 300 mg /dl. -Patient is not a candidate for metformin,SGLT2 inhibitors due to CKD.  - Patient specific target  for A1c; LDL, HDL, Triglycerides, and  Waist Circumference were discussed in detail.  2) BP/HTN: Controlled. Continue current medications. 3) Lipids/HPL:  continue statins. 4) hypothyroidism:  -He is on levothyroxine with questionable consistency of intake. His thyroid function tests show above target TSH. I will continue levothyroxine 175 g by mouth every morning.  - We discussed about correct intake of levothyroxine, at fasting, with water, separated by at least 30 minutes from breakfast, and separated by more than 4 hours from calcium, iron, multivitamins, acid reflux medications (PPIs). -Patient is made aware of  the fact that thyroid hormone replacement is needed for life, dose to be adjusted by periodic monitoring of thyroid function tests.  5) Chronic Care/Health Maintenance:  -Patient is on Statin medications and encouraged to continue to follow up with Ophthalmology, Podiatrist at least yearly or according to recommendations, and advised to  stay away from smoking. I have recommended yearly flu vaccine and pneumonia vaccination at least every 5 years; he cannot exercise optimally due to deconditioning and disequilibrium, and  sleep for at least 7 hours a day.  - 25 minutes of time was spent on the care of this patient , 50% of which was applied for counseling on diabetes complications and their preventions.  - I advised patient to maintain close follow up with Tula Nakayama, MD for primary care needs.  Patient is asked to bring meter and  blood glucose logs during their next visit.   Follow up plan: -Return in about 3 months (around 07/17/2016) for diabetes, high blood pressure, high cholesterol, underactive thyroid, follow up with pre-visit labs, meter, and logs.  Glade Lloyd, MD Phone: [phone]  Fax: [phone]   04/16/2016, 2:51 PM

## 2016-04-17 DIAGNOSIS — B351 Tinea unguium: Secondary | ICD-10-CM | POA: Diagnosis not present

## 2016-04-17 DIAGNOSIS — E1142 Type 2 diabetes mellitus with diabetic polyneuropathy: Secondary | ICD-10-CM | POA: Diagnosis not present

## 2016-04-29 DIAGNOSIS — D509 Iron deficiency anemia, unspecified: Secondary | ICD-10-CM | POA: Diagnosis not present

## 2016-04-29 DIAGNOSIS — R809 Proteinuria, unspecified: Secondary | ICD-10-CM | POA: Diagnosis not present

## 2016-04-29 DIAGNOSIS — E559 Vitamin D deficiency, unspecified: Secondary | ICD-10-CM | POA: Diagnosis not present

## 2016-04-29 DIAGNOSIS — N184 Chronic kidney disease, stage 4 (severe): Secondary | ICD-10-CM | POA: Diagnosis not present

## 2016-04-29 DIAGNOSIS — Z79899 Other long term (current) drug therapy: Secondary | ICD-10-CM | POA: Diagnosis not present

## 2016-04-30 DIAGNOSIS — R809 Proteinuria, unspecified: Secondary | ICD-10-CM | POA: Diagnosis not present

## 2016-04-30 DIAGNOSIS — D509 Iron deficiency anemia, unspecified: Secondary | ICD-10-CM | POA: Diagnosis not present

## 2016-04-30 DIAGNOSIS — Z79899 Other long term (current) drug therapy: Secondary | ICD-10-CM | POA: Diagnosis not present

## 2016-04-30 DIAGNOSIS — E559 Vitamin D deficiency, unspecified: Secondary | ICD-10-CM | POA: Diagnosis not present

## 2016-04-30 DIAGNOSIS — N184 Chronic kidney disease, stage 4 (severe): Secondary | ICD-10-CM | POA: Diagnosis not present

## 2016-05-11 ENCOUNTER — Telehealth: Payer: Self-pay | Admitting: Family Medicine

## 2016-05-11 NOTE — Telephone Encounter (Signed)
Chase [name] is stating that [name] is coughing real bad and is asking if something could be called in for him, Please advise?

## 2016-05-11 NOTE — Telephone Encounter (Signed)
Coughing up clear mucus x a couple days. Advised to use robitussin DM for a few days and try the urgent care if fever develops or symptoms worsen and feels that he needs clinical evaluation. Wife understands

## 2016-05-12 DIAGNOSIS — N184 Chronic kidney disease, stage 4 (severe): Secondary | ICD-10-CM | POA: Diagnosis not present

## 2016-05-12 DIAGNOSIS — R809 Proteinuria, unspecified: Secondary | ICD-10-CM | POA: Diagnosis not present

## 2016-05-12 DIAGNOSIS — I10 Essential (primary) hypertension: Secondary | ICD-10-CM | POA: Diagnosis not present

## 2016-05-12 DIAGNOSIS — D638 Anemia in other chronic diseases classified elsewhere: Secondary | ICD-10-CM | POA: Diagnosis not present

## 2016-05-21 DIAGNOSIS — N184 Chronic kidney disease, stage 4 (severe): Secondary | ICD-10-CM | POA: Diagnosis not present

## 2016-06-11 ENCOUNTER — Telehealth: Payer: Self-pay

## 2016-06-11 DIAGNOSIS — Z0289 Encounter for other administrative examinations: Secondary | ICD-10-CM

## 2016-06-11 DIAGNOSIS — N184 Chronic kidney disease, stage 4 (severe): Secondary | ICD-10-CM | POA: Diagnosis not present

## 2016-06-11 NOTE — Telephone Encounter (Signed)
Error

## 2016-06-26 DIAGNOSIS — B351 Tinea unguium: Secondary | ICD-10-CM | POA: Diagnosis not present

## 2016-06-26 DIAGNOSIS — E1142 Type 2 diabetes mellitus with diabetic polyneuropathy: Secondary | ICD-10-CM | POA: Diagnosis not present

## 2016-06-30 DIAGNOSIS — M159 Polyosteoarthritis, unspecified: Secondary | ICD-10-CM | POA: Diagnosis not present

## 2016-06-30 DIAGNOSIS — I10 Essential (primary) hypertension: Secondary | ICD-10-CM | POA: Diagnosis not present

## 2016-06-30 DIAGNOSIS — F329 Major depressive disorder, single episode, unspecified: Secondary | ICD-10-CM | POA: Diagnosis not present

## 2016-06-30 DIAGNOSIS — E039 Hypothyroidism, unspecified: Secondary | ICD-10-CM | POA: Diagnosis not present

## 2016-06-30 DIAGNOSIS — E1140 Type 2 diabetes mellitus with diabetic neuropathy, unspecified: Secondary | ICD-10-CM | POA: Diagnosis not present

## 2016-06-30 DIAGNOSIS — G40909 Epilepsy, unspecified, not intractable, without status epilepticus: Secondary | ICD-10-CM | POA: Diagnosis not present

## 2016-06-30 DIAGNOSIS — R2681 Unsteadiness on feet: Secondary | ICD-10-CM | POA: Diagnosis not present

## 2016-06-30 DIAGNOSIS — I6990 Unspecified sequelae of unspecified cerebrovascular disease: Secondary | ICD-10-CM | POA: Diagnosis not present

## 2016-07-02 DIAGNOSIS — E039 Hypothyroidism, unspecified: Secondary | ICD-10-CM | POA: Diagnosis not present

## 2016-07-02 DIAGNOSIS — I69398 Other sequelae of cerebral infarction: Secondary | ICD-10-CM | POA: Diagnosis not present

## 2016-07-02 DIAGNOSIS — Z9181 History of falling: Secondary | ICD-10-CM | POA: Diagnosis not present

## 2016-07-02 DIAGNOSIS — M6281 Muscle weakness (generalized): Secondary | ICD-10-CM | POA: Diagnosis not present

## 2016-07-02 DIAGNOSIS — Z794 Long term (current) use of insulin: Secondary | ICD-10-CM | POA: Diagnosis not present

## 2016-07-02 DIAGNOSIS — G40909 Epilepsy, unspecified, not intractable, without status epilepticus: Secondary | ICD-10-CM | POA: Diagnosis not present

## 2016-07-02 DIAGNOSIS — R2681 Unsteadiness on feet: Secondary | ICD-10-CM | POA: Diagnosis not present

## 2016-07-02 DIAGNOSIS — I6990 Unspecified sequelae of unspecified cerebrovascular disease: Secondary | ICD-10-CM | POA: Diagnosis not present

## 2016-07-02 DIAGNOSIS — M159 Polyosteoarthritis, unspecified: Secondary | ICD-10-CM | POA: Diagnosis not present

## 2016-07-02 DIAGNOSIS — I10 Essential (primary) hypertension: Secondary | ICD-10-CM | POA: Diagnosis not present

## 2016-07-02 DIAGNOSIS — N184 Chronic kidney disease, stage 4 (severe): Secondary | ICD-10-CM | POA: Diagnosis not present

## 2016-07-02 DIAGNOSIS — Z7902 Long term (current) use of antithrombotics/antiplatelets: Secondary | ICD-10-CM | POA: Diagnosis not present

## 2016-07-02 DIAGNOSIS — F329 Major depressive disorder, single episode, unspecified: Secondary | ICD-10-CM | POA: Diagnosis not present

## 2016-07-02 DIAGNOSIS — E1140 Type 2 diabetes mellitus with diabetic neuropathy, unspecified: Secondary | ICD-10-CM | POA: Diagnosis not present

## 2016-07-07 DIAGNOSIS — M6281 Muscle weakness (generalized): Secondary | ICD-10-CM | POA: Diagnosis not present

## 2016-07-07 DIAGNOSIS — I69398 Other sequelae of cerebral infarction: Secondary | ICD-10-CM | POA: Diagnosis not present

## 2016-07-07 DIAGNOSIS — M159 Polyosteoarthritis, unspecified: Secondary | ICD-10-CM | POA: Diagnosis not present

## 2016-07-07 DIAGNOSIS — I10 Essential (primary) hypertension: Secondary | ICD-10-CM | POA: Diagnosis not present

## 2016-07-07 DIAGNOSIS — E1140 Type 2 diabetes mellitus with diabetic neuropathy, unspecified: Secondary | ICD-10-CM | POA: Diagnosis not present

## 2016-07-07 DIAGNOSIS — G40909 Epilepsy, unspecified, not intractable, without status epilepticus: Secondary | ICD-10-CM | POA: Diagnosis not present

## 2016-07-08 ENCOUNTER — Other Ambulatory Visit: Payer: Self-pay | Admitting: Family Medicine

## 2016-07-08 DIAGNOSIS — G40909 Epilepsy, unspecified, not intractable, without status epilepticus: Secondary | ICD-10-CM | POA: Diagnosis not present

## 2016-07-08 DIAGNOSIS — E1140 Type 2 diabetes mellitus with diabetic neuropathy, unspecified: Secondary | ICD-10-CM | POA: Diagnosis not present

## 2016-07-08 DIAGNOSIS — I69398 Other sequelae of cerebral infarction: Secondary | ICD-10-CM | POA: Diagnosis not present

## 2016-07-08 DIAGNOSIS — M6281 Muscle weakness (generalized): Secondary | ICD-10-CM | POA: Diagnosis not present

## 2016-07-08 DIAGNOSIS — M159 Polyosteoarthritis, unspecified: Secondary | ICD-10-CM | POA: Diagnosis not present

## 2016-07-08 DIAGNOSIS — I10 Essential (primary) hypertension: Secondary | ICD-10-CM | POA: Diagnosis not present

## 2016-07-10 DIAGNOSIS — G40909 Epilepsy, unspecified, not intractable, without status epilepticus: Secondary | ICD-10-CM | POA: Diagnosis not present

## 2016-07-10 DIAGNOSIS — E1140 Type 2 diabetes mellitus with diabetic neuropathy, unspecified: Secondary | ICD-10-CM | POA: Diagnosis not present

## 2016-07-10 DIAGNOSIS — I69398 Other sequelae of cerebral infarction: Secondary | ICD-10-CM | POA: Diagnosis not present

## 2016-07-10 DIAGNOSIS — I10 Essential (primary) hypertension: Secondary | ICD-10-CM | POA: Diagnosis not present

## 2016-07-10 DIAGNOSIS — M159 Polyosteoarthritis, unspecified: Secondary | ICD-10-CM | POA: Diagnosis not present

## 2016-07-10 DIAGNOSIS — M6281 Muscle weakness (generalized): Secondary | ICD-10-CM | POA: Diagnosis not present

## 2016-07-13 DIAGNOSIS — M159 Polyosteoarthritis, unspecified: Secondary | ICD-10-CM | POA: Diagnosis not present

## 2016-07-13 DIAGNOSIS — I10 Essential (primary) hypertension: Secondary | ICD-10-CM | POA: Diagnosis not present

## 2016-07-13 DIAGNOSIS — E1140 Type 2 diabetes mellitus with diabetic neuropathy, unspecified: Secondary | ICD-10-CM | POA: Diagnosis not present

## 2016-07-13 DIAGNOSIS — M6281 Muscle weakness (generalized): Secondary | ICD-10-CM | POA: Diagnosis not present

## 2016-07-13 DIAGNOSIS — G40909 Epilepsy, unspecified, not intractable, without status epilepticus: Secondary | ICD-10-CM | POA: Diagnosis not present

## 2016-07-13 DIAGNOSIS — I69398 Other sequelae of cerebral infarction: Secondary | ICD-10-CM | POA: Diagnosis not present

## 2016-07-14 DIAGNOSIS — E1140 Type 2 diabetes mellitus with diabetic neuropathy, unspecified: Secondary | ICD-10-CM | POA: Diagnosis not present

## 2016-07-14 DIAGNOSIS — M159 Polyosteoarthritis, unspecified: Secondary | ICD-10-CM | POA: Diagnosis not present

## 2016-07-14 DIAGNOSIS — I10 Essential (primary) hypertension: Secondary | ICD-10-CM | POA: Diagnosis not present

## 2016-07-14 DIAGNOSIS — M6281 Muscle weakness (generalized): Secondary | ICD-10-CM | POA: Diagnosis not present

## 2016-07-14 DIAGNOSIS — I69398 Other sequelae of cerebral infarction: Secondary | ICD-10-CM | POA: Diagnosis not present

## 2016-07-14 DIAGNOSIS — G40909 Epilepsy, unspecified, not intractable, without status epilepticus: Secondary | ICD-10-CM | POA: Diagnosis not present

## 2016-07-15 DIAGNOSIS — I10 Essential (primary) hypertension: Secondary | ICD-10-CM | POA: Diagnosis not present

## 2016-07-15 DIAGNOSIS — M6281 Muscle weakness (generalized): Secondary | ICD-10-CM | POA: Diagnosis not present

## 2016-07-15 DIAGNOSIS — M159 Polyosteoarthritis, unspecified: Secondary | ICD-10-CM | POA: Diagnosis not present

## 2016-07-15 DIAGNOSIS — E1140 Type 2 diabetes mellitus with diabetic neuropathy, unspecified: Secondary | ICD-10-CM | POA: Diagnosis not present

## 2016-07-15 DIAGNOSIS — I69398 Other sequelae of cerebral infarction: Secondary | ICD-10-CM | POA: Diagnosis not present

## 2016-07-15 DIAGNOSIS — G40909 Epilepsy, unspecified, not intractable, without status epilepticus: Secondary | ICD-10-CM | POA: Diagnosis not present

## 2016-07-17 DIAGNOSIS — I10 Essential (primary) hypertension: Secondary | ICD-10-CM | POA: Diagnosis not present

## 2016-07-17 DIAGNOSIS — M6281 Muscle weakness (generalized): Secondary | ICD-10-CM | POA: Diagnosis not present

## 2016-07-17 DIAGNOSIS — G40909 Epilepsy, unspecified, not intractable, without status epilepticus: Secondary | ICD-10-CM | POA: Diagnosis not present

## 2016-07-17 DIAGNOSIS — I69398 Other sequelae of cerebral infarction: Secondary | ICD-10-CM | POA: Diagnosis not present

## 2016-07-17 DIAGNOSIS — M159 Polyosteoarthritis, unspecified: Secondary | ICD-10-CM | POA: Diagnosis not present

## 2016-07-17 DIAGNOSIS — E1140 Type 2 diabetes mellitus with diabetic neuropathy, unspecified: Secondary | ICD-10-CM | POA: Diagnosis not present

## 2016-07-20 DIAGNOSIS — I69398 Other sequelae of cerebral infarction: Secondary | ICD-10-CM | POA: Diagnosis not present

## 2016-07-20 DIAGNOSIS — M6281 Muscle weakness (generalized): Secondary | ICD-10-CM | POA: Diagnosis not present

## 2016-07-20 DIAGNOSIS — G40909 Epilepsy, unspecified, not intractable, without status epilepticus: Secondary | ICD-10-CM | POA: Diagnosis not present

## 2016-07-20 DIAGNOSIS — M159 Polyosteoarthritis, unspecified: Secondary | ICD-10-CM | POA: Diagnosis not present

## 2016-07-20 DIAGNOSIS — E1140 Type 2 diabetes mellitus with diabetic neuropathy, unspecified: Secondary | ICD-10-CM | POA: Diagnosis not present

## 2016-07-20 DIAGNOSIS — I10 Essential (primary) hypertension: Secondary | ICD-10-CM | POA: Diagnosis not present

## 2016-07-21 DIAGNOSIS — G40909 Epilepsy, unspecified, not intractable, without status epilepticus: Secondary | ICD-10-CM | POA: Diagnosis not present

## 2016-07-21 DIAGNOSIS — M159 Polyosteoarthritis, unspecified: Secondary | ICD-10-CM | POA: Diagnosis not present

## 2016-07-21 DIAGNOSIS — I69398 Other sequelae of cerebral infarction: Secondary | ICD-10-CM | POA: Diagnosis not present

## 2016-07-21 DIAGNOSIS — M6281 Muscle weakness (generalized): Secondary | ICD-10-CM | POA: Diagnosis not present

## 2016-07-21 DIAGNOSIS — I10 Essential (primary) hypertension: Secondary | ICD-10-CM | POA: Diagnosis not present

## 2016-07-21 DIAGNOSIS — E1140 Type 2 diabetes mellitus with diabetic neuropathy, unspecified: Secondary | ICD-10-CM | POA: Diagnosis not present

## 2016-07-22 DIAGNOSIS — E1140 Type 2 diabetes mellitus with diabetic neuropathy, unspecified: Secondary | ICD-10-CM | POA: Diagnosis not present

## 2016-07-22 DIAGNOSIS — I69398 Other sequelae of cerebral infarction: Secondary | ICD-10-CM | POA: Diagnosis not present

## 2016-07-22 DIAGNOSIS — G40909 Epilepsy, unspecified, not intractable, without status epilepticus: Secondary | ICD-10-CM | POA: Diagnosis not present

## 2016-07-22 DIAGNOSIS — M159 Polyosteoarthritis, unspecified: Secondary | ICD-10-CM | POA: Diagnosis not present

## 2016-07-22 DIAGNOSIS — M6281 Muscle weakness (generalized): Secondary | ICD-10-CM | POA: Diagnosis not present

## 2016-07-22 DIAGNOSIS — I10 Essential (primary) hypertension: Secondary | ICD-10-CM | POA: Diagnosis not present

## 2016-07-23 DIAGNOSIS — D509 Iron deficiency anemia, unspecified: Secondary | ICD-10-CM | POA: Diagnosis not present

## 2016-07-23 DIAGNOSIS — N184 Chronic kidney disease, stage 4 (severe): Secondary | ICD-10-CM | POA: Diagnosis not present

## 2016-07-24 DIAGNOSIS — G40909 Epilepsy, unspecified, not intractable, without status epilepticus: Secondary | ICD-10-CM | POA: Diagnosis not present

## 2016-07-24 DIAGNOSIS — I10 Essential (primary) hypertension: Secondary | ICD-10-CM | POA: Diagnosis not present

## 2016-07-24 DIAGNOSIS — E1140 Type 2 diabetes mellitus with diabetic neuropathy, unspecified: Secondary | ICD-10-CM | POA: Diagnosis not present

## 2016-07-24 DIAGNOSIS — M159 Polyosteoarthritis, unspecified: Secondary | ICD-10-CM | POA: Diagnosis not present

## 2016-07-24 DIAGNOSIS — I69398 Other sequelae of cerebral infarction: Secondary | ICD-10-CM | POA: Diagnosis not present

## 2016-07-24 DIAGNOSIS — M6281 Muscle weakness (generalized): Secondary | ICD-10-CM | POA: Diagnosis not present

## 2016-07-27 ENCOUNTER — Encounter (HOSPITAL_COMMUNITY): Payer: Self-pay | Admitting: Emergency Medicine

## 2016-07-27 ENCOUNTER — Emergency Department (HOSPITAL_COMMUNITY)
Admission: EM | Admit: 2016-07-27 | Discharge: 2016-07-28 | Disposition: A | Discharge status: Home / Self Care | Payer: Medicare Other | Attending: Emergency Medicine | Admitting: Emergency Medicine

## 2016-07-27 DIAGNOSIS — E039 Hypothyroidism, unspecified: Secondary | ICD-10-CM | POA: Diagnosis not present

## 2016-07-27 DIAGNOSIS — Z794 Long term (current) use of insulin: Secondary | ICD-10-CM | POA: Insufficient documentation

## 2016-07-27 DIAGNOSIS — Z79899 Other long term (current) drug therapy: Secondary | ICD-10-CM | POA: Insufficient documentation

## 2016-07-27 DIAGNOSIS — N289 Disorder of kidney and ureter, unspecified: Secondary | ICD-10-CM | POA: Diagnosis not present

## 2016-07-27 DIAGNOSIS — Z7982 Long term (current) use of aspirin: Secondary | ICD-10-CM | POA: Diagnosis not present

## 2016-07-27 DIAGNOSIS — I130 Hypertensive heart and chronic kidney disease with heart failure and stage 1 through stage 4 chronic kidney disease, or unspecified chronic kidney disease: Secondary | ICD-10-CM | POA: Insufficient documentation

## 2016-07-27 DIAGNOSIS — Z9114 Patient's other noncompliance with medication regimen: Secondary | ICD-10-CM | POA: Diagnosis not present

## 2016-07-27 DIAGNOSIS — N390 Urinary tract infection, site not specified: Secondary | ICD-10-CM

## 2016-07-27 DIAGNOSIS — Z91138 Patient's unintentional underdosing of medication regimen for other reason: Secondary | ICD-10-CM | POA: Diagnosis not present

## 2016-07-27 DIAGNOSIS — R569 Unspecified convulsions: Secondary | ICD-10-CM | POA: Diagnosis not present

## 2016-07-27 DIAGNOSIS — G40909 Epilepsy, unspecified, not intractable, without status epilepticus: Secondary | ICD-10-CM | POA: Diagnosis not present

## 2016-07-27 DIAGNOSIS — E1122 Type 2 diabetes mellitus with diabetic chronic kidney disease: Secondary | ICD-10-CM | POA: Insufficient documentation

## 2016-07-27 DIAGNOSIS — I509 Heart failure, unspecified: Secondary | ICD-10-CM | POA: Insufficient documentation

## 2016-07-27 DIAGNOSIS — I10 Essential (primary) hypertension: Secondary | ICD-10-CM | POA: Diagnosis not present

## 2016-07-27 DIAGNOSIS — I69398 Other sequelae of cerebral infarction: Secondary | ICD-10-CM | POA: Diagnosis not present

## 2016-07-27 DIAGNOSIS — N184 Chronic kidney disease, stage 4 (severe): Secondary | ICD-10-CM | POA: Insufficient documentation

## 2016-07-27 DIAGNOSIS — E1140 Type 2 diabetes mellitus with diabetic neuropathy, unspecified: Secondary | ICD-10-CM | POA: Diagnosis not present

## 2016-07-27 DIAGNOSIS — M159 Polyosteoarthritis, unspecified: Secondary | ICD-10-CM | POA: Diagnosis not present

## 2016-07-27 DIAGNOSIS — M6281 Muscle weakness (generalized): Secondary | ICD-10-CM | POA: Diagnosis not present

## 2016-07-27 NOTE — ED Triage Notes (Signed)
Pt was walking with walking and had a shaking episode and would not speak to wife. Pt has been normal since episode. Pcp wanted pt checked out. Pt has history of seizures.

## 2016-07-27 NOTE — ED Provider Notes (Signed)
[facility] DEPT Provider Note   CSN: [id number] Arrival date & time: 07/27/16  1954 By signing my name below, I, Georgette Shell, attest that this documentation has been prepared under the direction and in the presence of Rolland Porter, MD. Electronically Signed: Georgette Shell, ED Scribe. 07/28/16. 12:56 AM.  Time Seen 12:45 AM  History   Chief Complaint Chief Complaint  Patient presents with  . Seizures   HPI Comments: Chase [name] is a 82 y.o. male with h/o bradycardia, CVA, DM, HTN, and seizures who presents to the Emergency Department for a possible seizure last night just PTA. Per wife, her and the pt were out on the porch when his whole body just started "shaking". She notes the shaking lasted about 5-10 minutes. His eyes were open and distant. Wife notes that he was not verbal during his episode and did not respond to her voice. After his shaking episode, pt seemed to be confused but this has since resolved. He did not fall during the episode.  Pt has had these symptoms before 4-5 years ago prior but notes that this one is not as bad as the others. Wife and sister report that he sometimes misses his medications. They know he missed his medication yesterday morning. They report lately all he does is sleep all day. Sometimes he does not get up until 6 PM in the evening. This makes it difficult for him to take his twice a day medication which includes his seizure medicine, Keppra. Pt is normally ambulatory with a walker. Denies any pain at this time. Pt denies fever.   PCP: Dr. Moshe Cipro  Neurologist: Dr. Merlene Laughter  The history is provided by the patient, the spouse and a relative. No language interpreter was used.    Past Medical History:  Diagnosis Date  . Bradycardia 03/15/2012  . CKD (chronic kidney disease) stage 4, GFR 15-29 ml/min (HCC)   . Complete lesion of cervical spinal cord ([place]) 03/14/3012   Stable since 2006  . CVA (cerebrovascular accident) ([place]) 09/07/12  . Depressive disorder,  not elsewhere classified   . Diabetes mellitus approx 1994  . Diabetic neuropathy ([place])   . Hypertensive heart disease   . Hypothyroidism approx 2000  . Lacunar stroke, acute ([place]) 03/14/2012  . Obesity   . Osteoarthrosis, unspecified whether generalized or localized, lower leg   . Other and unspecified hyperlipidemia   . Peripheral vascular disease, unspecified ([place])   . Seizures ([place])   . Spinal stenosis, unspecified region other than cervical   . Spondylosis of unspecified site without mention of myelopathy     Patient Active Problem List   Diagnosis Date Noted  . Essential hypertension, benign 12/13/2015  . Medicare annual wellness visit, subsequent 07/14/2015  . Knee pain, left 04/02/2015  . Calculus of gallbladder with acute cholecystitis without obstruction   . RBBB (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 fascicular block)   . Hypertensive heart disease 12/01/2014  . Elevated troponin 12/01/2014  . Chronic venous insufficiency 11/16/2014  . Carotid artery disease ([place]) 05/18/2014  . Poor balance 09/11/2013  . Difficulty walking 09/11/2013  . Hx of falling 09/11/2013  . History of stroke 09/07/2012  . CKD (chronic kidney disease) stage 4, GFR 15-29 ml/min (HCC) 09/07/2012  . Anemia 09/07/2012  . Cholelithiasis 08/10/2012  . Peripheral autonomic neuropathy due to diabetes mellitus ([place]) 03/14/2012  . Seizure disorder, complex partial ([place]) 03/07/2012  . Uncontrolled type 2 diabetes mellitus with stage 4 chronic kidney disease ([place])   .  Hypothyroidism 03/09/2008  . Hyperlipidemia 03/09/2008  . Obesity 03/09/2008  . Renovascular hypertension 03/09/2008  . Peripheral vascular disease ([place]) 03/09/2008  . DEGENERATIVE JOINT DISEASE, KNEE 01/26/2008  . Osteoarthritis of spine 01/26/2008  . Spinal stenosis     Past Surgical History:  Procedure Laterality Date  . COLONOSCOPY N/A 08/10/2013   Procedure: COLONOSCOPY;  Surgeon: Rogene Houston, MD;  Location: [facility];  Service: Endoscopy;  Laterality: N/A;  240  . kidney stones left x2  1975  . KIDNEY SURGERY     Ruptured left kidney 30 yrs ago  from a kidney stone       Home Medications    Prior to Admission medications   Medication Sig Start Date End Date Taking? Authorizing Provider  amLODipine (NORVASC) 10 MG tablet Take 1 tablet (10 mg total) by mouth daily. 03/26/16   [name], MD  aspirin EC 81 MG tablet Take 1 tablet (81 mg total) by mouth daily. 06/16/13   [name], MD  chlorthalidone (HYGROTON) 50 MG tablet Take 1 tablet (50 mg total) by mouth daily. 03/26/16   [name], MD  ciprofloxacin (CIPRO) 500 MG tablet Take 1 tablet (500 mg total) by mouth daily. 07/28/16   Rolland Porter, MD  clopidogrel (PLAVIX) 75 MG tablet TAKE ONE TABLET BY MOUTH ONCE DAILY 07/08/16   [name], MD  folic acid (FOLVITE) 1 MG tablet Take 1 tablet (1 mg total) by mouth daily. 03/26/16   [name], MD  hydrALAZINE (APRESOLINE) 50 MG tablet TAKE ONE TABLET BY MOUTH TWICE DAILY 12/05/15   Herminio Commons, MD  insulin lispro protamine-insulin lispro (HUMALOG 75/25) (75-25) 100 UNIT/ML SUSP Inject 10 Units into the skin 2 (two) times daily with a meal. With breakfast and supper when blood glucose is above 90    Historical Provider, MD  levETIRAcetam (KEPPRA) 500 MG tablet Take 250 mg by mouth at bedtime.     Historical Provider, MD  levothyroxine (SYNTHROID, LEVOTHROID) 175 MCG tablet Take 1 tablet (175 mcg total) by mouth every morning. 12/13/15   Cassandria Anger, MD  lovastatin (MEVACOR) 40 MG tablet TAKE ONE TABLET BY MOUTH AT BEDTIME 08/26/15   [name], MD  Multiple Vitamins-Minerals (EYE VITAMINS PO) Take 1 tablet by mouth daily.     Historical Provider, MD  tamsulosin (FLOMAX) 0.4 MG CAPS capsule Take 0.4 mg by mouth.    Historical Provider, MD  torsemide (DEMADEX) 20 MG tablet Take 20 mg by mouth once.  06/13/15   Historical Provider, MD  Vitamin D,  Ergocalciferol, (DRISDOL) 50000 units CAPS capsule Take 1 capsule (50,000 Units total) by mouth once a week. 03/26/16   [name], MD    Family History Family History  Problem Relation Age of Onset  . Diabetes Mother   . Prostate cancer Father   . Diabetes Brother   . Diabetes Brother   . Hypertension Brother   . Hypertension Brother   . Hypertension Brother     Social History Social History  Substance Use Topics  . Smoking status: Never Smoker  . Smokeless tobacco: Never Used  . Alcohol use No  Lives at home Lives with spouse Uses a walker   Allergies   Bayer advanced aspirin [aspirin] and Penicillins   Review of Systems Review of Systems  Constitutional: Negative for fever.  Neurological: Positive for seizures.  All other systems reviewed and are negative.  Physical Exam Updated Vital Signs  BP 167/76 (BP Location: Right Arm)   Pulse 76   Temp 98.6 F (37 C) (Oral)   Resp 15   Ht 5\' 8"  (1.727 m)   Wt 178 lb (80.7 kg)   SpO2 100%   BMI 27.06 kg/m   Physical Exam  Constitutional: He is oriented to person, place, and time.  HENT:  No trauma to pt's tongue.  Neurological: He is oriented to person, place, and time.  Overall, pt is slow in demeanor and in speech.     ED Treatments / Results  DIAGNOSTIC STUDIES: Oxygen Saturation is 100% on RA, normal by my interpretation.       Labs (all labs ordered are listed, but only abnormal results are displayed) Results for orders placed or performed during the hospital encounter of 07/27/16  Comprehensive metabolic panel  Result Value Ref Range   Sodium 136 135 - 145 mmol/L   Potassium 4.5 3.5 - 5.1 mmol/L   Chloride 110 101 - 111 mmol/L   CO2 22 22 - 32 mmol/L   Glucose, Bld 125 (H) 65 - 99 mg/dL   BUN 52 (H) 6 - 20 mg/dL   Creatinine, Ser 3.33 (H) 0.61 - 1.24 mg/dL   Calcium 8.6 (L) 8.9 - 10.3 mg/dL   Total Protein 7.5 6.5 - 8.1 g/dL   Albumin 4.1 3.5 - 5.0 g/dL   AST 31 15 - 41 U/L   ALT  33 17 - 63 U/L   Alkaline Phosphatase 93 38 - 126 U/L   Total Bilirubin 0.4 0.3 - 1.2 mg/dL   GFR calc non Af Amer 16 (L) >60 mL/min   GFR calc Af Amer 18 (L) >60 mL/min   Anion gap 4 (L) 5 - 15  CBC with Differential  Result Value Ref Range   WBC 6.1 4.0 - 10.5 K/uL   RBC 3.56 (L) 4.22 - 5.81 MIL/uL   Hemoglobin 11.2 (L) 13.0 - 17.0 g/dL   HCT 34.5 (L) 39.0 - 52.0 %   MCV 96.9 78.0 - 100.0 fL   MCH 31.5 26.0 - 34.0 pg   MCHC 32.5 30.0 - 36.0 g/dL   RDW 13.2 11.5 - 15.5 %   Platelets 188 150 - 400 K/uL   Neutrophils Relative % 53 %   Neutro Abs 3.2 1.7 - 7.7 K/uL   Lymphocytes Relative 29 %   Lymphs Abs 1.8 0.7 - 4.0 K/uL   Monocytes Relative 9 %   Monocytes Absolute 0.6 0.1 - 1.0 K/uL   Eosinophils Relative 9 %   Eosinophils Absolute 0.5 0.0 - 0.7 K/uL   Basophils Relative 0 %   Basophils Absolute 0.0 0.0 - 0.1 K/uL  Urinalysis, Routine w reflex microscopic  Result Value Ref Range   Color, Urine YELLOW YELLOW   APPearance CLEAR CLEAR   Specific Gravity, Urine 1.020 1.005 - 1.030   pH 6.0 5.0 - 8.0   Glucose, UA NEGATIVE NEGATIVE mg/dL   Hgb urine dipstick NEGATIVE NEGATIVE   Bilirubin Urine NEGATIVE NEGATIVE   Ketones, ur NEGATIVE NEGATIVE mg/dL   Protein, ur 100 (A) NEGATIVE mg/dL   Nitrite NEGATIVE NEGATIVE   Leukocytes, UA SMALL (A) NEGATIVE  Urine microscopic-add on  Result Value Ref Range   Squamous Epithelial / LPF 0-5 (A) NONE SEEN   WBC, UA 6-30 0 - 5 WBC/hpf   RBC / HPF 0-5 0 - 5 RBC/hpf   Bacteria, UA FEW (A) NONE SEEN   Laboratory interpretation all normal except Stable chronic renal  insufficiency, mild anemia, possible UTI    EKG  EKG Interpretation  Date/Time:  Monday July 27 2016 23:03:59 EDT Ventricular Rate:  72 PR Interval:    QRS Duration: 126 QT Interval:  433 QTC Calculation: 474 R Axis:   -79 Text Interpretation:  Sinus rhythm Consider left atrial enlargement Right bundle branch block Left ventricular hypertrophy Anterolateral  infarct, age indeterminate Baseline wander in lead(s) V3 Electrode noise No significant change since last tracing 30 Nov 2014 Confirmed by Arzella Rehmann  MD-I, Leeloo Silverthorne ([id number]) on 07/28/2016 12:53:19 AM       Radiology Ct Head Wo Contrast  Result Date: 07/28/2016 CLINICAL DATA:  Seizure. EXAM: CT HEAD WITHOUT CONTRAST TECHNIQUE: Contiguous axial images were obtained from the base of the skull through the vertex without intravenous contrast. COMPARISON:  02/02/2013 FINDINGS: Brain: No evidence of acute infarction, hemorrhage, hydrocephalus, extra-axial collection or mass lesion/mass effect. Chronic cortically based gliosis in the high left frontal parietal region from remote infarct. Remote lacunar infarct in the genu of the right internal capsule. Interval but chronic infarct near the left caudate head. Chronic microvascular disease in the cerebral white matter with confluent parietal gliosis. Stable normal cerebral volume for age. Vascular: No hyperdense vessel.  Atherosclerotic calcification. Skull: Negative Sinuses/Orbits: Negative IMPRESSION: 1. No acute finding. 2. Remote high left cortical infarcts. No other cortical finding to explain seizure. 3. Chronic microvascular disease with progression in the left basal ganglia region when compared to 2014. Electronically Signed   By: Monte Fantasia M.D.   On: 07/28/2016 01:57    Procedures Procedures (including critical care time)  Medications Ordered in ED Medications  levETIRAcetam (KEPPRA) 500 mg in sodium chloride 0.9 % 100 mL IVPB (0 mg Intravenous Stopped 07/28/16 0215)  ciprofloxacin (CIPRO) tablet 500 mg (not administered)     Initial Impression / Assessment and Plan / ED Course  I have reviewed the triage vital signs and the nursing notes.  Pertinent labs & imaging results that were available during my care of the patient were reviewed by me and considered in my medical decision making (see chart for details).  Clinical Course   12:55 AM  Discussed treatment plan with pt at bedside which includes head CT and urinalysis and pt agreed to plan. Patient was given a 500 mg dose of Keppra while in the ED.  After talking to the wife I suspect patient has not been taking his seizure medication as to rectum. He may have a urinary tract infection may account for his sleeping all the time. He was started on Cipro for a possible UTI. CT does not show any acute changes tonight. He was advised to follow-up with his neurologist, Dr. Merlene Laughter, and to follow-up on his [employer] drug level drawn tonight.  Final Clinical Impressions(s) / ED Diagnoses   Final diagnoses:  Seizure disorder ([place])  Noncompliance with medications  Renal insufficiency  UTI (lower urinary tract infection)    New Prescriptions   CIPROFLOXACIN (CIPRO) 500 MG TABLET    Take 1 tablet (500 mg total) by mouth daily.     Plan discharge  Rolland Porter, MD, FACEP  I personally performed the services described in this documentation, which was scribed in my presence. The recorded information has been reviewed and considered.  Rolland Porter, MD, Barbette Or, MD 07/28/16 [phone]

## 2016-07-27 NOTE — ED Notes (Signed)
Patient placed in gown and on cardiac monitoring. Patient has had EKG and seen by Dr Roderic Palau.

## 2016-07-28 ENCOUNTER — Emergency Department (HOSPITAL_COMMUNITY): Payer: Medicare Other

## 2016-07-28 DIAGNOSIS — G40909 Epilepsy, unspecified, not intractable, without status epilepticus: Secondary | ICD-10-CM | POA: Diagnosis not present

## 2016-07-28 DIAGNOSIS — R569 Unspecified convulsions: Secondary | ICD-10-CM | POA: Diagnosis not present

## 2016-07-28 LAB — URINE MICROSCOPIC-ADD ON

## 2016-07-28 LAB — CBC WITH DIFFERENTIAL/PLATELET
Basophils Absolute: 0 10*3/uL (ref 0.0–0.1)
Basophils Relative: 0 %
Eosinophils Absolute: 0.5 10*3/uL (ref 0.0–0.7)
Eosinophils Relative: 9 %
HCT: 34.5 % — ABNORMAL LOW (ref 39.0–52.0)
Hemoglobin: 11.2 g/dL — ABNORMAL LOW (ref 13.0–17.0)
Lymphocytes Relative: 29 %
Lymphs Abs: 1.8 10*3/uL (ref 0.7–4.0)
MCH: 31.5 pg (ref 26.0–34.0)
MCHC: 32.5 g/dL (ref 30.0–36.0)
MCV: 96.9 fL (ref 78.0–100.0)
Monocytes Absolute: 0.6 10*3/uL (ref 0.1–1.0)
Monocytes Relative: 9 %
Neutro Abs: 3.2 10*3/uL (ref 1.7–7.7)
Neutrophils Relative %: 53 %
Platelets: 188 10*3/uL (ref 150–400)
RBC: 3.56 MIL/uL — ABNORMAL LOW (ref 4.22–5.81)
RDW: 13.2 % (ref 11.5–15.5)
WBC: 6.1 10*3/uL (ref 4.0–10.5)

## 2016-07-28 LAB — COMPREHENSIVE METABOLIC PANEL
ALT: 33 U/L (ref 17–63)
AST: 31 U/L (ref 15–41)
Albumin: 4.1 g/dL (ref 3.5–5.0)
Alkaline Phosphatase: 93 U/L (ref 38–126)
Anion gap: 4 — ABNORMAL LOW (ref 5–15)
BUN: 52 mg/dL — ABNORMAL HIGH (ref 6–20)
CO2: 22 mmol/L (ref 22–32)
Calcium: 8.6 mg/dL — ABNORMAL LOW (ref 8.9–10.3)
Chloride: 110 mmol/L (ref 101–111)
Creatinine, Ser: 3.33 mg/dL — ABNORMAL HIGH (ref 0.61–1.24)
GFR calc Af Amer: 18 mL/min — ABNORMAL LOW (ref >60)
GFR calc non Af Amer: 16 mL/min — ABNORMAL LOW (ref >60)
Glucose, Bld: 125 mg/dL — ABNORMAL HIGH (ref 65–99)
Potassium: 4.5 mmol/L (ref 3.5–5.1)
Sodium: 136 mmol/L (ref 135–145)
Total Bilirubin: 0.4 mg/dL (ref 0.3–1.2)
Total Protein: 7.5 g/dL (ref 6.5–8.1)

## 2016-07-28 LAB — URINALYSIS, ROUTINE W REFLEX MICROSCOPIC
Bilirubin Urine: NEGATIVE
Glucose, UA: NEGATIVE mg/dL
Hgb urine dipstick: NEGATIVE
Ketones, ur: NEGATIVE mg/dL
Nitrite: NEGATIVE
Protein, ur: 100 mg/dL — AB
Specific Gravity, Urine: 1.02 (ref 1.005–1.030)
pH: 6 (ref 5.0–8.0)

## 2016-07-28 MED ORDER — LEVETIRACETAM IN NACL 500 MG/100ML IV SOLN
INTRAVENOUS | Status: AC
  Filled 2016-07-28: qty 100

## 2016-07-28 MED ORDER — CIPROFLOXACIN HCL 500 MG PO TABS: 500.0000 mg | ORAL_TABLET | Freq: Two times a day (BID) | ORAL | 0 refills | Status: DC

## 2016-07-28 MED ORDER — CIPROFLOXACIN HCL 250 MG PO TABS
500.0000 mg | ORAL_TABLET | Freq: Once | ORAL | Status: AC
Start: 2016-07-28 — End: 2016-07-28
  Administered 2016-07-28: 500 mg via ORAL
  Filled 2016-07-28: qty 2

## 2016-07-28 MED ORDER — SODIUM CHLORIDE 0.9 % IV SOLN
500.0000 mg | Freq: Two times a day (BID) | INTRAVENOUS | Status: DC
  Administered 2016-07-28: 500 mg via INTRAVENOUS
  Filled 2016-07-28 (×2): qty 5

## 2016-07-28 MED ORDER — CIPROFLOXACIN HCL 500 MG PO TABS: 500.0000 mg | ORAL_TABLET | Freq: Every day | ORAL | 0 refills | Status: DC

## 2016-07-28 NOTE — Discharge Instructions (Signed)
Make sure you take your medications twice a day. I sent a levetiracetam (Keppra) level tonight, please call Dr Freddie Apley office and let him know about your ED visit tonight and that the keppra level needs to be checked by him when it is resulted. He may have a urinary tract infection, give him the antibiotics until gone.

## 2016-07-29 DIAGNOSIS — M159 Polyosteoarthritis, unspecified: Secondary | ICD-10-CM | POA: Diagnosis not present

## 2016-07-29 DIAGNOSIS — M6281 Muscle weakness (generalized): Secondary | ICD-10-CM | POA: Diagnosis not present

## 2016-07-29 DIAGNOSIS — G40909 Epilepsy, unspecified, not intractable, without status epilepticus: Secondary | ICD-10-CM | POA: Diagnosis not present

## 2016-07-29 DIAGNOSIS — I10 Essential (primary) hypertension: Secondary | ICD-10-CM | POA: Diagnosis not present

## 2016-07-29 DIAGNOSIS — I69398 Other sequelae of cerebral infarction: Secondary | ICD-10-CM | POA: Diagnosis not present

## 2016-07-29 DIAGNOSIS — E1140 Type 2 diabetes mellitus with diabetic neuropathy, unspecified: Secondary | ICD-10-CM | POA: Diagnosis not present

## 2016-07-30 ENCOUNTER — Other Ambulatory Visit: Payer: Self-pay | Admitting: "Endocrinology

## 2016-07-30 DIAGNOSIS — E038 Other specified hypothyroidism: Secondary | ICD-10-CM | POA: Diagnosis not present

## 2016-07-30 DIAGNOSIS — E1122 Type 2 diabetes mellitus with diabetic chronic kidney disease: Secondary | ICD-10-CM | POA: Diagnosis not present

## 2016-07-30 DIAGNOSIS — N184 Chronic kidney disease, stage 4 (severe): Secondary | ICD-10-CM | POA: Diagnosis not present

## 2016-07-30 DIAGNOSIS — E1165 Type 2 diabetes mellitus with hyperglycemia: Secondary | ICD-10-CM | POA: Diagnosis not present

## 2016-07-30 LAB — URINE CULTURE: Culture: 50000 — AB

## 2016-07-30 LAB — LEVETIRACETAM LEVEL: Levetiracetam Lvl: 7.1 ug/mL — ABNORMAL LOW (ref 10.0–40.0)

## 2016-07-31 ENCOUNTER — Telehealth (HOSPITAL_BASED_OUTPATIENT_CLINIC_OR_DEPARTMENT_OTHER): Payer: Self-pay | Admitting: Emergency Medicine

## 2016-07-31 DIAGNOSIS — G40909 Epilepsy, unspecified, not intractable, without status epilepticus: Secondary | ICD-10-CM | POA: Diagnosis not present

## 2016-07-31 DIAGNOSIS — E1140 Type 2 diabetes mellitus with diabetic neuropathy, unspecified: Secondary | ICD-10-CM | POA: Diagnosis not present

## 2016-07-31 DIAGNOSIS — I69398 Other sequelae of cerebral infarction: Secondary | ICD-10-CM | POA: Diagnosis not present

## 2016-07-31 DIAGNOSIS — M6281 Muscle weakness (generalized): Secondary | ICD-10-CM | POA: Diagnosis not present

## 2016-07-31 DIAGNOSIS — I10 Essential (primary) hypertension: Secondary | ICD-10-CM | POA: Diagnosis not present

## 2016-07-31 DIAGNOSIS — M159 Polyosteoarthritis, unspecified: Secondary | ICD-10-CM | POA: Diagnosis not present

## 2016-07-31 LAB — COMPLETE METABOLIC PANEL WITH GFR
ALT: 22 U/L (ref 9–46)
AST: 20 U/L (ref 10–35)
Albumin: 3.9 g/dL (ref 3.6–5.1)
Alkaline Phosphatase: 92 U/L (ref 40–115)
BUN: 52 mg/dL — ABNORMAL HIGH (ref 7–25)
CO2: 19 mmol/L — ABNORMAL LOW (ref 20–31)
Calcium: 9.1 mg/dL (ref 8.6–10.3)
Chloride: 108 mmol/L (ref 98–110)
Creat: 3.48 mg/dL — ABNORMAL HIGH (ref 0.70–1.11)
GFR, Est African American: 18 mL/min — ABNORMAL LOW (ref >=60)
GFR, Est Non African American: 15 mL/min — ABNORMAL LOW (ref >=60)
Glucose, Bld: 184 mg/dL — ABNORMAL HIGH (ref 65–99)
Potassium: 4.4 mmol/L (ref 3.5–5.3)
Sodium: 138 mmol/L (ref 135–146)
Total Bilirubin: 0.4 mg/dL (ref 0.2–1.2)
Total Protein: 6.6 g/dL (ref 6.1–8.1)

## 2016-07-31 LAB — T4, FREE: Free T4: 1.7 ng/dL (ref 0.8–1.8)

## 2016-07-31 LAB — HEMOGLOBIN A1C
Hgb A1c MFr Bld: 5.7 % — ABNORMAL HIGH (ref <5.7)
Mean Plasma Glucose: 117 mg/dL

## 2016-07-31 LAB — TSH: TSH: 2.19 mIU/L (ref 0.40–4.50)

## 2016-07-31 NOTE — Telephone Encounter (Signed)
Post ED Visit - Positive Culture Follow-up  Culture report reviewed by antimicrobial stewardship pharmacist:  []  Elenor Quinones, Pharm.D. []  Heide Guile, Pharm.D., BCPS []  Parks Neptune, Pharm.D. []  Alycia Rossetti, Pharm.D., BCPS []  [place], [place].D., BCPS, AAHIVP []  Legrand Como, Pharm.D., BCPS, AAHIVP []  Milus Glazier, Pharm.D. []  Rob Evette Doffing, Pharm.D. Andria Meuse RPh  Positive urine culture Treated with cirpofloxacin, organism sensitive to the same and no further patient follow-up is required at this time.  Hazle Nordmann 07/31/2016, 4:37 PM

## 2016-08-03 DIAGNOSIS — M159 Polyosteoarthritis, unspecified: Secondary | ICD-10-CM | POA: Diagnosis not present

## 2016-08-03 DIAGNOSIS — G40909 Epilepsy, unspecified, not intractable, without status epilepticus: Secondary | ICD-10-CM | POA: Diagnosis not present

## 2016-08-03 DIAGNOSIS — E1140 Type 2 diabetes mellitus with diabetic neuropathy, unspecified: Secondary | ICD-10-CM | POA: Diagnosis not present

## 2016-08-03 DIAGNOSIS — I10 Essential (primary) hypertension: Secondary | ICD-10-CM | POA: Diagnosis not present

## 2016-08-03 DIAGNOSIS — I69398 Other sequelae of cerebral infarction: Secondary | ICD-10-CM | POA: Diagnosis not present

## 2016-08-03 DIAGNOSIS — M6281 Muscle weakness (generalized): Secondary | ICD-10-CM | POA: Diagnosis not present

## 2016-08-04 DIAGNOSIS — M159 Polyosteoarthritis, unspecified: Secondary | ICD-10-CM | POA: Diagnosis not present

## 2016-08-04 DIAGNOSIS — M6281 Muscle weakness (generalized): Secondary | ICD-10-CM | POA: Diagnosis not present

## 2016-08-04 DIAGNOSIS — E1140 Type 2 diabetes mellitus with diabetic neuropathy, unspecified: Secondary | ICD-10-CM | POA: Diagnosis not present

## 2016-08-04 DIAGNOSIS — I10 Essential (primary) hypertension: Secondary | ICD-10-CM | POA: Diagnosis not present

## 2016-08-04 DIAGNOSIS — G40909 Epilepsy, unspecified, not intractable, without status epilepticus: Secondary | ICD-10-CM | POA: Diagnosis not present

## 2016-08-04 DIAGNOSIS — I69398 Other sequelae of cerebral infarction: Secondary | ICD-10-CM | POA: Diagnosis not present

## 2016-08-05 ENCOUNTER — Ambulatory Visit (INDEPENDENT_AMBULATORY_CARE_PROVIDER_SITE_OTHER): Payer: Medicare Other | Admitting: "Endocrinology

## 2016-08-05 ENCOUNTER — Ambulatory Visit: Payer: Medicare Other | Admitting: "Endocrinology

## 2016-08-05 ENCOUNTER — Encounter: Payer: Self-pay | Admitting: "Endocrinology

## 2016-08-05 VITALS — BP 137/82 | HR 86 | Ht 68.0 in

## 2016-08-05 DIAGNOSIS — E785 Hyperlipidemia, unspecified: Secondary | ICD-10-CM | POA: Diagnosis not present

## 2016-08-05 DIAGNOSIS — E1165 Type 2 diabetes mellitus with hyperglycemia: Secondary | ICD-10-CM

## 2016-08-05 DIAGNOSIS — N184 Chronic kidney disease, stage 4 (severe): Secondary | ICD-10-CM

## 2016-08-05 DIAGNOSIS — I10 Essential (primary) hypertension: Secondary | ICD-10-CM

## 2016-08-05 DIAGNOSIS — E1122 Type 2 diabetes mellitus with diabetic chronic kidney disease: Secondary | ICD-10-CM

## 2016-08-05 DIAGNOSIS — E038 Other specified hypothyroidism: Secondary | ICD-10-CM

## 2016-08-05 NOTE — Progress Notes (Signed)
Subjective:    Patient ID: SISTO INGRUM, male    DOB: 1931-05-06, PCP Tula Nakayama, MD   Past Medical History:  Diagnosis Date  . Bradycardia 03/15/2012  . CKD (chronic kidney disease) stage 4, GFR 15-29 ml/min (HCC)   . Complete lesion of cervical spinal cord ([place]) 03/14/3012   Stable since 2006  . CVA (cerebrovascular accident) ([place]) 09/07/12  . Depressive disorder, not elsewhere classified   . Diabetes mellitus approx 1994  . Diabetic neuropathy ([place])   . Hypertensive heart disease   . Hypothyroidism approx 2000  . Lacunar stroke, acute ([place]) 03/14/2012  . Obesity   . Osteoarthrosis, unspecified whether generalized or localized, lower leg   . Other and unspecified hyperlipidemia   . Peripheral vascular disease, unspecified ([place])   . Seizures ([place])   . Spinal stenosis, unspecified region other than cervical   . Spondylosis of unspecified site without mention of myelopathy    Past Surgical History:  Procedure Laterality Date  . COLONOSCOPY N/A 08/10/2013   Procedure: COLONOSCOPY;  Surgeon: Rogene Houston, MD;  Location: [facility];  Service: Endoscopy;  Laterality: N/A;  240  . kidney stones left x2  1975  . KIDNEY SURGERY     Ruptured left kidney 30 yrs ago  from a kidney stone   Social History   Social History  . Marital status: Married    Spouse name: N/A  . Number of children: 5  . Years of education: N/A   Occupational History  . retired  Retired   Social History Main Topics  . Smoking status: Never Smoker  . Smokeless tobacco: Never Used  . Alcohol use No  . Drug use: No  . Sexual activity: No   Other Topics Concern  . None   Social History Narrative  . None   Outpatient Encounter Prescriptions as of 08/05/2016  Medication Sig  . amLODipine (NORVASC) 10 MG tablet Take 1 tablet (10 mg total) by mouth daily.  Marland Kitchen aspirin EC 81 MG tablet Take 1 tablet (81 mg total) by mouth daily.  . chlorthalidone (HYGROTON) 50 MG tablet Take 1 tablet (50 mg total)  by mouth daily.  . ciprofloxacin (CIPRO) 500 MG tablet Take 1 tablet (500 mg total) by mouth daily.  . clopidogrel (PLAVIX) 75 MG tablet TAKE ONE TABLET BY MOUTH ONCE DAILY  . folic acid (FOLVITE) 1 MG tablet Take 1 tablet (1 mg total) by mouth daily.  . hydrALAZINE (APRESOLINE) 50 MG tablet TAKE ONE TABLET BY MOUTH TWICE DAILY  . insulin lispro protamine-insulin lispro (HUMALOG 75/25) (75-25) 100 UNIT/ML SUSP Inject 10 Units into the skin 2 (two) times daily with a meal. With breakfast and supper when blood glucose is above 90  . levETIRAcetam (KEPPRA) 500 MG tablet Take 250 mg by mouth at bedtime.   Marland Kitchen levothyroxine (SYNTHROID, LEVOTHROID) 175 MCG tablet Take 1 tablet (175 mcg total) by mouth every morning.  . lovastatin (MEVACOR) 40 MG tablet TAKE ONE TABLET BY MOUTH AT BEDTIME  . Multiple Vitamins-Minerals (EYE VITAMINS PO) Take 1 tablet by mouth daily.   . tamsulosin (FLOMAX) 0.4 MG CAPS capsule Take 0.4 mg by mouth.  . torsemide (DEMADEX) 20 MG tablet Take 20 mg by mouth once.   . Vitamin D, Ergocalciferol, (DRISDOL) 50000 units CAPS capsule Take 1 capsule (50,000 Units total) by mouth once a week.   No facility-administered encounter medications on file as of 08/05/2016.    ALLERGIES: Allergies  Allergen Reactions  .  Bayer Advanced Aspirin [Aspirin] Nausea And Vomiting  . Penicillins Nausea And Vomiting   VACCINATION STATUS: Immunization History  Administered Date(s) Administered  . H1N1 11/14/2008  . Influenza Split 10/07/2011, 09/08/2012  . Influenza Whole 08/22/2007, 08/27/2010  . Influenza,inj,Quad PF,36+ Mos 08/22/2013, 09/26/2014, 09/26/2015  . Pneumococcal Conjugate-13 06/12/2014  . Pneumococcal Polysaccharide-23 05/21/2004  . Td 05/21/2004  . Tdap 10/07/2011    Diabetes  He presents for his follow-up diabetic visit. He has type 2 diabetes mellitus. Onset time: He was diagnosed at approximate age of 65 years. His disease course has been stable. There are no  hypoglycemic associated symptoms. Pertinent negatives for hypoglycemia include no confusion, headaches, pallor or seizures. There are no diabetic associated symptoms. Pertinent negatives for diabetes include no chest pain, no fatigue, no polydipsia, no polyphagia, no polyuria and no weakness. There are no hypoglycemic complications. Symptoms are stable. Diabetic complications include nephropathy and PVD. Risk factors for coronary artery disease include diabetes mellitus, dyslipidemia, male sex, obesity and sedentary lifestyle. Current diabetic treatment includes insulin injections. He is compliant with treatment most of the time. His weight is stable. He is following a diabetic diet. When asked about meal planning, he reported none. He never participates in exercise. His home blood glucose trend is decreasing steadily. His breakfast blood glucose range is generally 140-180 mg/dl. His dinner blood glucose range is generally 140-180 mg/dl.  Hyperlipidemia  This is a chronic problem. The current episode started more than 1 year ago. Pertinent negatives include no chest pain, myalgias or shortness of breath. Current antihyperlipidemic treatment includes statins.  Hypertension  This is a chronic problem. The current episode started more than 1 year ago. Pertinent negatives include no chest pain, headaches, neck pain, palpitations or shortness of breath. Past treatments include direct vasodilators. Hypertensive end-organ damage includes PVD and a thyroid problem.  Thyroid Problem  Presents for follow-up visit. Patient reports no constipation, diarrhea, fatigue or palpitations. The symptoms have been stable. Past treatments include levothyroxine. His past medical history is significant for hyperlipidemia.     Review of Systems  Constitutional: Negative for chills, fatigue, fever and unexpected weight change.  HENT: Negative for dental problem, mouth sores and trouble swallowing.   Eyes: Negative for visual  disturbance.  Respiratory: Negative for cough, choking, chest tightness, shortness of breath and wheezing.   Cardiovascular: Negative for chest pain, palpitations and leg swelling.  Gastrointestinal: Negative for abdominal distention, abdominal pain, constipation, diarrhea, nausea and vomiting.  Endocrine: Negative for polydipsia, polyphagia and polyuria.  Genitourinary: Negative for dysuria, flank pain, hematuria and urgency.  Musculoskeletal: Positive for gait problem. Negative for back pain, myalgias and neck pain.       Walks with a walker, due to disequilibrium and arthritis.  Skin: Negative for pallor, rash and wound.  Neurological: Negative for seizures, syncope, weakness, numbness and headaches.  Psychiatric/Behavioral: Negative.  Negative for confusion and dysphoric mood.    Objective:    BP 137/82   Pulse 86   Ht 5\' 8"  (1.727 m)   Wt Readings from Last 3 Encounters:  07/27/16 178 lb (80.7 kg)  03/12/16 184 lb (83.5 kg)  12/13/15 188 lb (85.3 kg)    Physical Exam  Constitutional: He is oriented to person, place, and time. He appears well-developed and well-nourished. He is cooperative. No distress.  HENT:  Head: Normocephalic and atraumatic.  Eyes: EOM are normal.  Neck: Normal range of motion. Neck supple. No tracheal deviation present. No thyromegaly present.  Cardiovascular: Normal rate, S1  normal, S2 normal and normal heart sounds.  Exam reveals no gallop.   No murmur heard. Pulses:      Dorsalis pedis pulses are 1+ on the right side, and 1+ on the left side.       Posterior tibial pulses are 1+ on the right side, and 1+ on the left side.  Pulmonary/Chest: Breath sounds normal. No respiratory distress. He has no wheezes.  Abdominal: Soft. Bowel sounds are normal. He exhibits no distension. There is no tenderness. There is no guarding and no CVA tenderness.  Musculoskeletal: He exhibits no edema.       Right shoulder: He exhibits no swelling and no deformity.        Right ankle: He exhibits swelling.       Left ankle: He exhibits swelling.       Right foot: There is swelling.       Left foot: There is swelling.  Neurological: He is alert and oriented to person, place, and time. He has normal strength and normal reflexes. No cranial nerve deficit or sensory deficit. Gait normal.  Skin: Skin is warm and dry. No rash noted. No cyanosis. Nails show no clubbing.  Psychiatric: He has a normal mood and affect. His speech is normal. Cognition and memory are normal.  He has moderate cognitive deficit.     CMP ( most recent) CMP     Component Value Date/Time   NA 138 07/30/2016 1338   K 4.4 07/30/2016 1338   CL 108 07/30/2016 1338   CO2 19 (L) 07/30/2016 1338   GLUCOSE 184 (H) 07/30/2016 1338   BUN 52 (H) 07/30/2016 1338   CREATININE 3.48 (H) 07/30/2016 1338   CALCIUM 9.1 07/30/2016 1338   PROT 6.6 07/30/2016 1338   ALBUMIN 3.9 07/30/2016 1338   AST 20 07/30/2016 1338   ALT 22 07/30/2016 1338   ALKPHOS 92 07/30/2016 1338   BILITOT 0.4 07/30/2016 1338   GFRNONAA 15 (L) 07/30/2016 1338   GFRAA 18 (L) 07/30/2016 1338     Diabetic Labs (most recent): Lab Results  Component Value Date   HGBA1C 5.7 (H) 07/30/2016   HGBA1C 6.0 (H) 04/13/2016   HGBA1C 6 11/27/2015     Lipid Panel ( most recent) Lipid Panel     Component Value Date/Time   CHOL 172 03/12/2016 1139   TRIG 38 03/12/2016 1139   HDL 80 03/12/2016 1139   CHOLHDL 2.2 03/12/2016 1139   VLDL 8 03/12/2016 1139   LDLCALC 84 03/12/2016 1139      Assessment & Plan:   1.  type 2 diabetes mellitus with stage 4 chronic kidney disease, with long-term current use of insulin (HCC) -his diabetes is  complicated by chronic kidney disease stage IV and patient remains at a high risk for more acute and chronic complications of diabetes which include CAD, CVA, CKD, retinopathy, and neuropathy. These are all discussed in detail with the patient.  Patient came with controlled glucose profile, and   recent A1c of 5.7 %.  Glucose logs and insulin administration records pertaining to this visit,  to be scanned into patient's records.  Recent labs reviewed.   - I have re-counseled the patient on diet management   by adopting a carbohydrate restricted / protein rich  Diet.  - Suggestion is made for patient to avoid simple carbohydrates   from their diet including Cakes , Desserts, Ice Cream,  Soda (  diet and regular) , Sweet Tea , Candies,  Chips, Cookies,  Artificial Sweeteners,   and "Sugar-free" Products .  This will help patient to have stable blood glucose profile and potentially avoid unintended  Weight gain.  - Patient is advised to stick to a routine mealtimes to eat 3 meals  a day and avoid unnecessary snacks ( to snack only to correct hypoglycemia).  - I have approached patient with the following individualized plan to manage diabetes and patient agrees.  - Will continue Humalog 75/25 10 units BID associated with monitoring of BG at least bid, for pre-meal blood glucose above 90 mg/dL.   - Patient is warned not to take insulin without proper monitoring per orders. -Adjustment parameters are given for hypo and hyperglycemia in writing. -Patient is encouraged to call clinic for blood glucose levels less than 70 or above 300 mg /dl. -Patient is not a candidate for metformin,SGLT2 inhibitors due to CKD.  - Patient specific target  for A1c; LDL, HDL, Triglycerides, and  Waist Circumference were discussed in detail.  2) BP/HTN: Controlled. Continue current medications. 3) Lipids/HPL:  continue statins. 4) hypothyroidism:  - His thyroid function tests are consistent with appropriate replacement with thyroid hormone. I will continue levothyroxine 175 g by mouth every morning.  - We discussed about correct intake of levothyroxine, at fasting, with water, separated by at least 30 minutes from breakfast, and separated by more than 4 hours from calcium, iron, multivitamins, acid reflux  medications (PPIs). -Patient is made aware of the fact that thyroid hormone replacement is needed for life, dose to be adjusted by periodic monitoring of thyroid function tests.  5) Chronic Care/Health Maintenance:  -Patient is on Statin medications and encouraged to continue to follow up with Ophthalmology, Podiatrist at least yearly or according to recommendations, and advised to  stay away from smoking. I have recommended yearly flu vaccine and pneumonia vaccination at least every 5 years; he cannot exercise optimally due to deconditioning and disequilibrium, and  sleep for at least 7 hours a day.  - 25 minutes of time was spent on the care of this patient , 50% of which was applied for counseling on diabetes complications and their preventions.  - I advised patient to maintain close follow up with Tula Nakayama, MD for primary care needs.  Patient is asked to bring meter and  blood glucose logs during their next visit.   Follow up plan: -Return in about 6 months (around 02/03/2017) for follow up with pre-visit labs, meter, and logs.  Glade Lloyd, MD Phone: [phone]  Fax: [phone]   08/05/2016, 11:23 AM

## 2016-08-07 DIAGNOSIS — I69398 Other sequelae of cerebral infarction: Secondary | ICD-10-CM | POA: Diagnosis not present

## 2016-08-07 DIAGNOSIS — M159 Polyosteoarthritis, unspecified: Secondary | ICD-10-CM | POA: Diagnosis not present

## 2016-08-07 DIAGNOSIS — M6281 Muscle weakness (generalized): Secondary | ICD-10-CM | POA: Diagnosis not present

## 2016-08-07 DIAGNOSIS — E1140 Type 2 diabetes mellitus with diabetic neuropathy, unspecified: Secondary | ICD-10-CM | POA: Diagnosis not present

## 2016-08-07 DIAGNOSIS — I10 Essential (primary) hypertension: Secondary | ICD-10-CM | POA: Diagnosis not present

## 2016-08-07 DIAGNOSIS — G40909 Epilepsy, unspecified, not intractable, without status epilepticus: Secondary | ICD-10-CM | POA: Diagnosis not present

## 2016-08-10 ENCOUNTER — Other Ambulatory Visit: Payer: Self-pay | Admitting: Family Medicine

## 2016-08-11 ENCOUNTER — Telehealth: Payer: Self-pay | Admitting: Family Medicine

## 2016-08-11 DIAGNOSIS — I10 Essential (primary) hypertension: Secondary | ICD-10-CM | POA: Diagnosis not present

## 2016-08-11 DIAGNOSIS — E1140 Type 2 diabetes mellitus with diabetic neuropathy, unspecified: Secondary | ICD-10-CM | POA: Diagnosis not present

## 2016-08-11 DIAGNOSIS — M6281 Muscle weakness (generalized): Secondary | ICD-10-CM | POA: Diagnosis not present

## 2016-08-11 DIAGNOSIS — G40909 Epilepsy, unspecified, not intractable, without status epilepticus: Secondary | ICD-10-CM | POA: Diagnosis not present

## 2016-08-11 DIAGNOSIS — I69398 Other sequelae of cerebral infarction: Secondary | ICD-10-CM | POA: Diagnosis not present

## 2016-08-11 DIAGNOSIS — M159 Polyosteoarthritis, unspecified: Secondary | ICD-10-CM | POA: Diagnosis not present

## 2016-08-11 NOTE — Telephone Encounter (Signed)
Please advise 

## 2016-08-11 NOTE — Telephone Encounter (Signed)
likley something he is eating, if she wants less frequent also increase fiber like bran ceral, again likely change in his diet, see whats new!

## 2016-08-11 NOTE — Telephone Encounter (Signed)
Chase [name] is stating that [place] has started in the last 3 days he is having frequent bowel movements, she states that he is not having diarrhea it is formed he is just having them very frequent back to back

## 2016-08-12 DIAGNOSIS — M159 Polyosteoarthritis, unspecified: Secondary | ICD-10-CM | POA: Diagnosis not present

## 2016-08-12 DIAGNOSIS — I10 Essential (primary) hypertension: Secondary | ICD-10-CM | POA: Diagnosis not present

## 2016-08-12 DIAGNOSIS — M6281 Muscle weakness (generalized): Secondary | ICD-10-CM | POA: Diagnosis not present

## 2016-08-12 DIAGNOSIS — I69398 Other sequelae of cerebral infarction: Secondary | ICD-10-CM | POA: Diagnosis not present

## 2016-08-12 DIAGNOSIS — G40909 Epilepsy, unspecified, not intractable, without status epilepticus: Secondary | ICD-10-CM | POA: Diagnosis not present

## 2016-08-12 DIAGNOSIS — E1140 Type 2 diabetes mellitus with diabetic neuropathy, unspecified: Secondary | ICD-10-CM | POA: Diagnosis not present

## 2016-08-13 ENCOUNTER — Telehealth: Payer: Self-pay | Admitting: Family Medicine

## 2016-08-13 DIAGNOSIS — N184 Chronic kidney disease, stage 4 (severe): Secondary | ICD-10-CM | POA: Diagnosis not present

## 2016-08-13 DIAGNOSIS — R809 Proteinuria, unspecified: Secondary | ICD-10-CM | POA: Diagnosis not present

## 2016-08-13 DIAGNOSIS — E559 Vitamin D deficiency, unspecified: Secondary | ICD-10-CM | POA: Diagnosis not present

## 2016-08-13 DIAGNOSIS — Z79899 Other long term (current) drug therapy: Secondary | ICD-10-CM | POA: Diagnosis not present

## 2016-08-13 NOTE — Telephone Encounter (Signed)
Wife called and said Chase [name] has been using the restroom too much

## 2016-08-13 NOTE — Telephone Encounter (Signed)
See previous message

## 2016-08-14 DIAGNOSIS — I10 Essential (primary) hypertension: Secondary | ICD-10-CM | POA: Diagnosis not present

## 2016-08-14 DIAGNOSIS — I69398 Other sequelae of cerebral infarction: Secondary | ICD-10-CM | POA: Diagnosis not present

## 2016-08-14 DIAGNOSIS — M159 Polyosteoarthritis, unspecified: Secondary | ICD-10-CM | POA: Diagnosis not present

## 2016-08-14 DIAGNOSIS — M6281 Muscle weakness (generalized): Secondary | ICD-10-CM | POA: Diagnosis not present

## 2016-08-14 DIAGNOSIS — E1140 Type 2 diabetes mellitus with diabetic neuropathy, unspecified: Secondary | ICD-10-CM | POA: Diagnosis not present

## 2016-08-14 DIAGNOSIS — G40909 Epilepsy, unspecified, not intractable, without status epilepticus: Secondary | ICD-10-CM | POA: Diagnosis not present

## 2016-08-14 NOTE — Telephone Encounter (Signed)
Wife aware

## 2016-08-17 DIAGNOSIS — M6281 Muscle weakness (generalized): Secondary | ICD-10-CM | POA: Diagnosis not present

## 2016-08-17 DIAGNOSIS — E1140 Type 2 diabetes mellitus with diabetic neuropathy, unspecified: Secondary | ICD-10-CM | POA: Diagnosis not present

## 2016-08-17 DIAGNOSIS — I69398 Other sequelae of cerebral infarction: Secondary | ICD-10-CM | POA: Diagnosis not present

## 2016-08-17 DIAGNOSIS — I10 Essential (primary) hypertension: Secondary | ICD-10-CM | POA: Diagnosis not present

## 2016-08-17 DIAGNOSIS — M159 Polyosteoarthritis, unspecified: Secondary | ICD-10-CM | POA: Diagnosis not present

## 2016-08-17 DIAGNOSIS — G40909 Epilepsy, unspecified, not intractable, without status epilepticus: Secondary | ICD-10-CM | POA: Diagnosis not present

## 2016-08-20 DIAGNOSIS — M6281 Muscle weakness (generalized): Secondary | ICD-10-CM | POA: Diagnosis not present

## 2016-08-20 DIAGNOSIS — I10 Essential (primary) hypertension: Secondary | ICD-10-CM | POA: Diagnosis not present

## 2016-08-20 DIAGNOSIS — E1140 Type 2 diabetes mellitus with diabetic neuropathy, unspecified: Secondary | ICD-10-CM | POA: Diagnosis not present

## 2016-08-20 DIAGNOSIS — G40909 Epilepsy, unspecified, not intractable, without status epilepticus: Secondary | ICD-10-CM | POA: Diagnosis not present

## 2016-08-20 DIAGNOSIS — I69398 Other sequelae of cerebral infarction: Secondary | ICD-10-CM | POA: Diagnosis not present

## 2016-08-20 DIAGNOSIS — M159 Polyosteoarthritis, unspecified: Secondary | ICD-10-CM | POA: Diagnosis not present

## 2016-08-24 DIAGNOSIS — I69398 Other sequelae of cerebral infarction: Secondary | ICD-10-CM | POA: Diagnosis not present

## 2016-08-24 DIAGNOSIS — M6281 Muscle weakness (generalized): Secondary | ICD-10-CM | POA: Diagnosis not present

## 2016-08-24 DIAGNOSIS — M159 Polyosteoarthritis, unspecified: Secondary | ICD-10-CM | POA: Diagnosis not present

## 2016-08-24 DIAGNOSIS — I10 Essential (primary) hypertension: Secondary | ICD-10-CM | POA: Diagnosis not present

## 2016-08-24 DIAGNOSIS — E1140 Type 2 diabetes mellitus with diabetic neuropathy, unspecified: Secondary | ICD-10-CM | POA: Diagnosis not present

## 2016-08-24 DIAGNOSIS — G40909 Epilepsy, unspecified, not intractable, without status epilepticus: Secondary | ICD-10-CM | POA: Diagnosis not present

## 2016-08-25 DIAGNOSIS — I10 Essential (primary) hypertension: Secondary | ICD-10-CM | POA: Diagnosis not present

## 2016-08-25 DIAGNOSIS — I509 Heart failure, unspecified: Secondary | ICD-10-CM | POA: Diagnosis not present

## 2016-08-25 DIAGNOSIS — E872 Acidosis: Secondary | ICD-10-CM | POA: Diagnosis not present

## 2016-08-25 DIAGNOSIS — R809 Proteinuria, unspecified: Secondary | ICD-10-CM | POA: Diagnosis not present

## 2016-08-25 DIAGNOSIS — E1129 Type 2 diabetes mellitus with other diabetic kidney complication: Secondary | ICD-10-CM | POA: Diagnosis not present

## 2016-08-25 DIAGNOSIS — N184 Chronic kidney disease, stage 4 (severe): Secondary | ICD-10-CM | POA: Diagnosis not present

## 2016-08-26 DIAGNOSIS — L929 Granulomatous disorder of the skin and subcutaneous tissue, unspecified: Secondary | ICD-10-CM | POA: Diagnosis not present

## 2016-08-27 DIAGNOSIS — M6281 Muscle weakness (generalized): Secondary | ICD-10-CM | POA: Diagnosis not present

## 2016-08-27 DIAGNOSIS — M159 Polyosteoarthritis, unspecified: Secondary | ICD-10-CM | POA: Diagnosis not present

## 2016-08-27 DIAGNOSIS — I10 Essential (primary) hypertension: Secondary | ICD-10-CM | POA: Diagnosis not present

## 2016-08-27 DIAGNOSIS — G40909 Epilepsy, unspecified, not intractable, without status epilepticus: Secondary | ICD-10-CM | POA: Diagnosis not present

## 2016-08-27 DIAGNOSIS — I69398 Other sequelae of cerebral infarction: Secondary | ICD-10-CM | POA: Diagnosis not present

## 2016-08-27 DIAGNOSIS — E1140 Type 2 diabetes mellitus with diabetic neuropathy, unspecified: Secondary | ICD-10-CM | POA: Diagnosis not present

## 2016-08-31 DIAGNOSIS — Z794 Long term (current) use of insulin: Secondary | ICD-10-CM | POA: Diagnosis not present

## 2016-08-31 DIAGNOSIS — F329 Major depressive disorder, single episode, unspecified: Secondary | ICD-10-CM | POA: Diagnosis not present

## 2016-08-31 DIAGNOSIS — M6281 Muscle weakness (generalized): Secondary | ICD-10-CM | POA: Diagnosis not present

## 2016-08-31 DIAGNOSIS — Z9181 History of falling: Secondary | ICD-10-CM | POA: Diagnosis not present

## 2016-08-31 DIAGNOSIS — I10 Essential (primary) hypertension: Secondary | ICD-10-CM | POA: Diagnosis not present

## 2016-08-31 DIAGNOSIS — I69398 Other sequelae of cerebral infarction: Secondary | ICD-10-CM | POA: Diagnosis not present

## 2016-08-31 DIAGNOSIS — E1140 Type 2 diabetes mellitus with diabetic neuropathy, unspecified: Secondary | ICD-10-CM | POA: Diagnosis not present

## 2016-08-31 DIAGNOSIS — I6990 Unspecified sequelae of unspecified cerebrovascular disease: Secondary | ICD-10-CM | POA: Diagnosis not present

## 2016-08-31 DIAGNOSIS — G40909 Epilepsy, unspecified, not intractable, without status epilepticus: Secondary | ICD-10-CM | POA: Diagnosis not present

## 2016-08-31 DIAGNOSIS — E039 Hypothyroidism, unspecified: Secondary | ICD-10-CM | POA: Diagnosis not present

## 2016-08-31 DIAGNOSIS — R2681 Unsteadiness on feet: Secondary | ICD-10-CM | POA: Diagnosis not present

## 2016-08-31 DIAGNOSIS — M159 Polyosteoarthritis, unspecified: Secondary | ICD-10-CM | POA: Diagnosis not present

## 2016-08-31 DIAGNOSIS — Z7902 Long term (current) use of antithrombotics/antiplatelets: Secondary | ICD-10-CM | POA: Diagnosis not present

## 2016-09-01 ENCOUNTER — Ambulatory Visit (INDEPENDENT_AMBULATORY_CARE_PROVIDER_SITE_OTHER): Payer: Medicare Other

## 2016-09-01 VITALS — BP 120/74 | HR 76 | Resp 18 | Ht 68.0 in | Wt 182.0 lb

## 2016-09-01 DIAGNOSIS — Z0000 Encounter for general adult medical examination without abnormal findings: Secondary | ICD-10-CM

## 2016-09-01 NOTE — Progress Notes (Signed)
Subjective:    Chase [name] is a 82 y.o. male who presents for Medicare Annual/Subsequent preventive examination.   Preventive Screening-Counseling & Management  Tobacco History  Smoking Status  . Never Smoker  Smokeless Tobacco  . Never Used     Current Problems (verified) Patient Active Problem List   Diagnosis Date Noted  . Essential hypertension, benign 12/13/2015  . Medicare annual wellness visit, subsequent 07/14/2015  . Knee pain, left 04/02/2015  . Calculus of gallbladder with acute cholecystitis without obstruction   . RBBB (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 fascicular block)   . Hypertensive heart disease 12/01/2014  . Elevated troponin 12/01/2014  . Chronic venous insufficiency 11/16/2014  . Carotid artery disease ([place]) 05/18/2014  . Poor balance 09/11/2013  . Difficulty walking 09/11/2013  . Hx of falling 09/11/2013  . History of stroke 09/07/2012  . CKD (chronic kidney disease) stage 4, GFR 15-29 ml/min (HCC) 09/07/2012  . Anemia 09/07/2012  . Cholelithiasis 08/10/2012  . Peripheral autonomic neuropathy due to diabetes mellitus ([place]) 03/14/2012  . Seizure disorder, complex partial ([place]) 03/07/2012  . Uncontrolled type 2 diabetes mellitus with stage 4 chronic kidney disease ([place])   . Hypothyroidism 03/09/2008  . Hyperlipidemia 03/09/2008  . Obesity 03/09/2008  . Renovascular hypertension 03/09/2008  . Peripheral vascular disease ([place]) 03/09/2008  . DEGENERATIVE JOINT DISEASE, KNEE 01/26/2008  . Osteoarthritis of spine 01/26/2008  . Spinal stenosis     Medications Prior to Visit Current Outpatient Prescriptions on File Prior to Visit  Medication Sig Dispense Refill  . amLODipine (NORVASC) 10 MG tablet Take 1 tablet (10 mg total) by mouth daily. 30 tablet 5  . aspirin EC 81 MG tablet Take 1 tablet (81 mg total) by mouth daily. 150 tablet 2  . chlorthalidone (HYGROTON) 50 MG tablet Take 1 tablet (50 mg total) by mouth daily. 30 tablet 5  .  clopidogrel (PLAVIX) 75 MG tablet TAKE ONE TABLET BY MOUTH ONCE DAILY 30 tablet 2  . folic acid (FOLVITE) 1 MG tablet Take 1 tablet (1 mg total) by mouth daily. 30 tablet 4  . hydrALAZINE (APRESOLINE) 50 MG tablet TAKE ONE TABLET BY MOUTH TWICE DAILY 60 tablet 6  . insulin lispro protamine-insulin lispro (HUMALOG 75/25) (75-25) 100 UNIT/ML SUSP Inject 10 Units into the skin 2 (two) times daily with a meal. With breakfast and supper when blood glucose is above 90    . levETIRAcetam (KEPPRA) 500 MG tablet Take 250 mg by mouth at bedtime.     Marland Kitchen levothyroxine (SYNTHROID, LEVOTHROID) 175 MCG tablet Take 1 tablet (175 mcg total) by mouth every morning. 30 tablet 6  . lovastatin (MEVACOR) 40 MG tablet TAKE ONE TABLET BY MOUTH AT BEDTIME 30 tablet 3  . Multiple Vitamins-Minerals (EYE VITAMINS PO) Take 1 tablet by mouth daily.     . tamsulosin (FLOMAX) 0.4 MG CAPS capsule Take 0.4 mg by mouth.    . torsemide (DEMADEX) 20 MG tablet Take 20 mg by mouth once.     . Vitamin D, Ergocalciferol, (DRISDOL) 50000 units CAPS capsule Take 1 capsule (50,000 Units total) by mouth once a week. 12 capsule 1   No current facility-administered medications on file prior to visit.     Current Medications (verified) Current Outpatient Prescriptions  Medication Sig Dispense Refill  . amLODipine (NORVASC) 10 MG tablet Take 1 tablet (10 mg total) by mouth daily. 30 tablet 5  . aspirin EC 81 MG tablet Take 1 tablet (81 mg total) by mouth  daily. 150 tablet 2  . chlorthalidone (HYGROTON) 50 MG tablet Take 1 tablet (50 mg total) by mouth daily. 30 tablet 5  . clopidogrel (PLAVIX) 75 MG tablet TAKE ONE TABLET BY MOUTH ONCE DAILY 30 tablet 2  . folic acid (FOLVITE) 1 MG tablet Take 1 tablet (1 mg total) by mouth daily. 30 tablet 4  . hydrALAZINE (APRESOLINE) 50 MG tablet TAKE ONE TABLET BY MOUTH TWICE DAILY 60 tablet 6  . insulin lispro protamine-insulin lispro (HUMALOG 75/25) (75-25) 100 UNIT/ML SUSP Inject 10 Units into the  skin 2 (two) times daily with a meal. With breakfast and supper when blood glucose is above 90    . levETIRAcetam (KEPPRA) 500 MG tablet Take 250 mg by mouth at bedtime.     Marland Kitchen levothyroxine (SYNTHROID, LEVOTHROID) 175 MCG tablet Take 1 tablet (175 mcg total) by mouth every morning. 30 tablet 6  . lovastatin (MEVACOR) 40 MG tablet TAKE ONE TABLET BY MOUTH AT BEDTIME 30 tablet 3  . Multiple Vitamins-Minerals (EYE VITAMINS PO) Take 1 tablet by mouth daily.     . tamsulosin (FLOMAX) 0.4 MG CAPS capsule Take 0.4 mg by mouth.    . torsemide (DEMADEX) 20 MG tablet Take 20 mg by mouth once.     . Vitamin D, Ergocalciferol, (DRISDOL) 50000 units CAPS capsule Take 1 capsule (50,000 Units total) by mouth once a week. 12 capsule 1   No current facility-administered medications for this visit.      Allergies (verified) Bayer advanced aspirin [aspirin] and Penicillins   PAST HISTORY  Family History Family History  Problem Relation Age of Onset  . Diabetes Mother   . Prostate cancer Father   . Diabetes Brother   . Diabetes Brother   . Hypertension Brother   . Hypertension Brother   . Hypertension Brother     Social History Social History  Substance Use Topics  . Smoking status: Never Smoker  . Smokeless tobacco: Never Used  . Alcohol use No    Are there smokers in your home (other than you)?  No  Risk Factors Current exercise habits: Exercise is limited by neurologic condition(s): seizure disorder and hx of CVA.  Dietary issues discussed: Encouraged patient to limit fried and fatty foods and to watch portion sizes   Cardiac risk factors: advanced age (older than 69 for men, 36 for women), dyslipidemia, hypertension, male gender, obesity (BMI >= 30 kg/m2) and sedentary lifestyle.  Depression Screen (Note: if answer to either of the following is "Yes", a more complete depression screening is indicated)   Q1: Over the past two weeks, have you felt down, depressed or hopeless? No  Q2:  Over the past two weeks, have you felt little interest or pleasure in doing things? No  Have you lost interest or pleasure in daily life? Yes  Do you often feel hopeless? No  Do you cry easily over simple problems? No  Activities of Daily Living In your present state of health, do you have any difficulty performing the following activities?:  Driving? No but advised to not drive alone or for long distances  Managing money?  No Feeding yourself? No Getting from bed to chair? Yes Climbing a flight of stairs? Yes Preparing food and eating?: Yes Bathing or showering? Yes Getting dressed: Yes Getting to the toilet? Yes Using the toilet:No Moving around from place to place: Yes In the past year have you fallen or had a near fall?:No   Are you sexually active?  No  Do you have more than one partner?  N/a  Hearing Difficulties: No Do you often ask people to speak up or repeat themselves? No Do you experience ringing or noises in your ears? No Do you have difficulty understanding soft or whispered voices? No   Do you feel that you have a problem with memory? No  Do you often misplace items? No  Do you feel safe at home?  Yes  Cognitive Testing  Alert? Yes  Normal Appearance?Yes  Oriented to person? Yes  Place? Yes   Time? Yes  Recall of three objects?  Yes  Can perform simple calculations? Yes  Displays appropriate judgment?Yes  Can read the correct time from a watch face?Yes   Advanced Directives have been discussed with the patient? Yes   List the Names of Other Physician/Practitioners you currently use: 1.  Dr. Lowanda Foster  (nephrology) 2.  Dr. Merlene Laughter (neurology)  3.  Dr. Dorris Fetch (endocrinology) 4.  Dr. Bronson Ing  (cardiology)  5. Dr. Radford Pax (opthamology)   Indicate any recent Medical Services you may have received from other than [facility] providers in the past year (date may be approximate).  Immunization History  Administered Date(s) Administered  . H1N1 11/14/2008  .  Influenza Split 10/07/2011, 09/08/2012  . Influenza Whole 08/22/2007, 08/27/2010  . Influenza,inj,Quad PF,36+ Mos 08/22/2013, 09/26/2014, 09/26/2015, 09/01/2016  . Pneumococcal Conjugate-13 06/12/2014  . Pneumococcal Polysaccharide-23 05/21/2004  . Td 05/21/2004  . Tdap 10/07/2011    Screening Tests Health Maintenance  Topic Date Due  . OPHTHALMOLOGY EXAM  03/27/2015  . ZOSTAVAX  10/27/2017 (Originally 08/08/1993)  . URINE MICROALBUMIN  11/11/2016  . FOOT EXAM  12/15/2016  . HEMOGLOBIN A1C  01/30/2017  . TETANUS/TDAP  10/06/2021  . INFLUENZA VACCINE  Completed  . PNA vac Low Risk Adult  Completed    All answers were reviewed with the patient and necessary referrals were made:  Vanetta Mulders, LPN   8/41/6606   History reviewed: allergies, current medications, past family history, past medical history, past social history, past surgical history and problem list  Review of Systems A comprehensive review of systems was negative.    Objective:     Vision by Snellen chart: right TKZ:[id number] to measure, left ATF:[id number] to measure Blood pressure 120/74, pulse 76, resp. rate 18, height 5\' 8"  (1.727 m), weight 182 lb (82.6 kg), SpO2 100 %. Body mass index is 27.67 kg/m.  No exam performed today, annual wellness visit without physical exam.     Assessment:     Plan:     During the course of the visit the patient was educated and counseled about appropriate screening and preventive services including:    Influenza vaccine  Diet review for nutrition referral? Yes ____  Not Indicated __x__   Patient Instructions (the written plan) was given to the patient.  Medicare Attestation I have personally reviewed: The patient's medical and social history Their use of alcohol, tobacco or illicit drugs Their current medications and supplements The patient's functional ability including ADLs,fall risks, home safety risks, cognitive, and hearing and visual  impairment Diet and physical activities Evidence for depression or mood disorders  The patient's weight, height, BMI, and visual acuity have been recorded in the chart.  I have made referrals, counseling, and provided education to the patient based on review of the above and I have provided the patient with a written personalized care plan for preventive services.     Denman Ravindra Spade, [place]   2/54/2706

## 2016-09-01 NOTE — Patient Instructions (Signed)
Thank you for choosing [facility] for your health care needs  The Annual Wellness Visit is designed to allow [place] the chance to assist you in preserving and improving you health.   Dr. Moshe Cipro will see you back in 3.5 months  Any labs needed will be mailed to you  If you have any concerns please don't hesitate to call.  The new # is [phone]

## 2016-09-04 DIAGNOSIS — B351 Tinea unguium: Secondary | ICD-10-CM | POA: Diagnosis not present

## 2016-09-04 DIAGNOSIS — E559 Vitamin D deficiency, unspecified: Secondary | ICD-10-CM | POA: Diagnosis not present

## 2016-09-04 DIAGNOSIS — Z79899 Other long term (current) drug therapy: Secondary | ICD-10-CM | POA: Diagnosis not present

## 2016-09-04 DIAGNOSIS — N184 Chronic kidney disease, stage 4 (severe): Secondary | ICD-10-CM | POA: Diagnosis not present

## 2016-09-04 DIAGNOSIS — E1142 Type 2 diabetes mellitus with diabetic polyneuropathy: Secondary | ICD-10-CM | POA: Diagnosis not present

## 2016-09-04 DIAGNOSIS — R809 Proteinuria, unspecified: Secondary | ICD-10-CM | POA: Diagnosis not present

## 2016-09-04 DIAGNOSIS — D509 Iron deficiency anemia, unspecified: Secondary | ICD-10-CM | POA: Diagnosis not present

## 2016-09-08 DIAGNOSIS — I69398 Other sequelae of cerebral infarction: Secondary | ICD-10-CM | POA: Diagnosis not present

## 2016-09-08 DIAGNOSIS — E1140 Type 2 diabetes mellitus with diabetic neuropathy, unspecified: Secondary | ICD-10-CM | POA: Diagnosis not present

## 2016-09-08 DIAGNOSIS — G40909 Epilepsy, unspecified, not intractable, without status epilepticus: Secondary | ICD-10-CM | POA: Diagnosis not present

## 2016-09-08 DIAGNOSIS — M6281 Muscle weakness (generalized): Secondary | ICD-10-CM | POA: Diagnosis not present

## 2016-09-08 DIAGNOSIS — I10 Essential (primary) hypertension: Secondary | ICD-10-CM | POA: Diagnosis not present

## 2016-09-08 DIAGNOSIS — M159 Polyosteoarthritis, unspecified: Secondary | ICD-10-CM | POA: Diagnosis not present

## 2016-09-09 ENCOUNTER — Other Ambulatory Visit: Payer: Self-pay | Admitting: "Endocrinology

## 2016-09-10 DIAGNOSIS — G40909 Epilepsy, unspecified, not intractable, without status epilepticus: Secondary | ICD-10-CM | POA: Diagnosis not present

## 2016-09-10 DIAGNOSIS — M159 Polyosteoarthritis, unspecified: Secondary | ICD-10-CM | POA: Diagnosis not present

## 2016-09-10 DIAGNOSIS — M6281 Muscle weakness (generalized): Secondary | ICD-10-CM | POA: Diagnosis not present

## 2016-09-10 DIAGNOSIS — I10 Essential (primary) hypertension: Secondary | ICD-10-CM | POA: Diagnosis not present

## 2016-09-10 DIAGNOSIS — E1140 Type 2 diabetes mellitus with diabetic neuropathy, unspecified: Secondary | ICD-10-CM | POA: Diagnosis not present

## 2016-09-10 DIAGNOSIS — I69398 Other sequelae of cerebral infarction: Secondary | ICD-10-CM | POA: Diagnosis not present

## 2016-09-14 DIAGNOSIS — I69398 Other sequelae of cerebral infarction: Secondary | ICD-10-CM | POA: Diagnosis not present

## 2016-09-14 DIAGNOSIS — E1140 Type 2 diabetes mellitus with diabetic neuropathy, unspecified: Secondary | ICD-10-CM | POA: Diagnosis not present

## 2016-09-14 DIAGNOSIS — I10 Essential (primary) hypertension: Secondary | ICD-10-CM | POA: Diagnosis not present

## 2016-09-14 DIAGNOSIS — M159 Polyosteoarthritis, unspecified: Secondary | ICD-10-CM | POA: Diagnosis not present

## 2016-09-14 DIAGNOSIS — M6281 Muscle weakness (generalized): Secondary | ICD-10-CM | POA: Diagnosis not present

## 2016-09-14 DIAGNOSIS — G40909 Epilepsy, unspecified, not intractable, without status epilepticus: Secondary | ICD-10-CM | POA: Diagnosis not present

## 2016-09-17 DIAGNOSIS — G40909 Epilepsy, unspecified, not intractable, without status epilepticus: Secondary | ICD-10-CM | POA: Diagnosis not present

## 2016-09-17 DIAGNOSIS — I69398 Other sequelae of cerebral infarction: Secondary | ICD-10-CM | POA: Diagnosis not present

## 2016-09-17 DIAGNOSIS — M6281 Muscle weakness (generalized): Secondary | ICD-10-CM | POA: Diagnosis not present

## 2016-09-17 DIAGNOSIS — E1140 Type 2 diabetes mellitus with diabetic neuropathy, unspecified: Secondary | ICD-10-CM | POA: Diagnosis not present

## 2016-09-17 DIAGNOSIS — M159 Polyosteoarthritis, unspecified: Secondary | ICD-10-CM | POA: Diagnosis not present

## 2016-09-17 DIAGNOSIS — I10 Essential (primary) hypertension: Secondary | ICD-10-CM | POA: Diagnosis not present

## 2016-09-22 DIAGNOSIS — G40909 Epilepsy, unspecified, not intractable, without status epilepticus: Secondary | ICD-10-CM | POA: Diagnosis not present

## 2016-09-22 DIAGNOSIS — I10 Essential (primary) hypertension: Secondary | ICD-10-CM | POA: Diagnosis not present

## 2016-09-22 DIAGNOSIS — M159 Polyosteoarthritis, unspecified: Secondary | ICD-10-CM | POA: Diagnosis not present

## 2016-09-22 DIAGNOSIS — I69398 Other sequelae of cerebral infarction: Secondary | ICD-10-CM | POA: Diagnosis not present

## 2016-09-22 DIAGNOSIS — E1140 Type 2 diabetes mellitus with diabetic neuropathy, unspecified: Secondary | ICD-10-CM | POA: Diagnosis not present

## 2016-09-22 DIAGNOSIS — M6281 Muscle weakness (generalized): Secondary | ICD-10-CM | POA: Diagnosis not present

## 2016-09-23 DIAGNOSIS — L929 Granulomatous disorder of the skin and subcutaneous tissue, unspecified: Secondary | ICD-10-CM | POA: Diagnosis not present

## 2016-09-24 DIAGNOSIS — E1140 Type 2 diabetes mellitus with diabetic neuropathy, unspecified: Secondary | ICD-10-CM | POA: Diagnosis not present

## 2016-09-24 DIAGNOSIS — M159 Polyosteoarthritis, unspecified: Secondary | ICD-10-CM | POA: Diagnosis not present

## 2016-09-24 DIAGNOSIS — I10 Essential (primary) hypertension: Secondary | ICD-10-CM | POA: Diagnosis not present

## 2016-09-24 DIAGNOSIS — I69398 Other sequelae of cerebral infarction: Secondary | ICD-10-CM | POA: Diagnosis not present

## 2016-09-24 DIAGNOSIS — M6281 Muscle weakness (generalized): Secondary | ICD-10-CM | POA: Diagnosis not present

## 2016-09-24 DIAGNOSIS — G40909 Epilepsy, unspecified, not intractable, without status epilepticus: Secondary | ICD-10-CM | POA: Diagnosis not present

## 2016-09-25 DIAGNOSIS — N184 Chronic kidney disease, stage 4 (severe): Secondary | ICD-10-CM | POA: Diagnosis not present

## 2016-09-25 DIAGNOSIS — D509 Iron deficiency anemia, unspecified: Secondary | ICD-10-CM | POA: Diagnosis not present

## 2016-09-29 DIAGNOSIS — G40909 Epilepsy, unspecified, not intractable, without status epilepticus: Secondary | ICD-10-CM | POA: Diagnosis not present

## 2016-09-29 DIAGNOSIS — I10 Essential (primary) hypertension: Secondary | ICD-10-CM | POA: Diagnosis not present

## 2016-09-29 DIAGNOSIS — M6281 Muscle weakness (generalized): Secondary | ICD-10-CM | POA: Diagnosis not present

## 2016-09-29 DIAGNOSIS — I69398 Other sequelae of cerebral infarction: Secondary | ICD-10-CM | POA: Diagnosis not present

## 2016-09-29 DIAGNOSIS — M159 Polyosteoarthritis, unspecified: Secondary | ICD-10-CM | POA: Diagnosis not present

## 2016-09-29 DIAGNOSIS — E1140 Type 2 diabetes mellitus with diabetic neuropathy, unspecified: Secondary | ICD-10-CM | POA: Diagnosis not present

## 2016-10-01 DIAGNOSIS — E1140 Type 2 diabetes mellitus with diabetic neuropathy, unspecified: Secondary | ICD-10-CM | POA: Diagnosis not present

## 2016-10-01 DIAGNOSIS — M6281 Muscle weakness (generalized): Secondary | ICD-10-CM | POA: Diagnosis not present

## 2016-10-01 DIAGNOSIS — G40909 Epilepsy, unspecified, not intractable, without status epilepticus: Secondary | ICD-10-CM | POA: Diagnosis not present

## 2016-10-01 DIAGNOSIS — M159 Polyosteoarthritis, unspecified: Secondary | ICD-10-CM | POA: Diagnosis not present

## 2016-10-01 DIAGNOSIS — I10 Essential (primary) hypertension: Secondary | ICD-10-CM | POA: Diagnosis not present

## 2016-10-01 DIAGNOSIS — I69398 Other sequelae of cerebral infarction: Secondary | ICD-10-CM | POA: Diagnosis not present

## 2016-10-05 DIAGNOSIS — E1140 Type 2 diabetes mellitus with diabetic neuropathy, unspecified: Secondary | ICD-10-CM | POA: Diagnosis not present

## 2016-10-05 DIAGNOSIS — M159 Polyosteoarthritis, unspecified: Secondary | ICD-10-CM | POA: Diagnosis not present

## 2016-10-05 DIAGNOSIS — M6281 Muscle weakness (generalized): Secondary | ICD-10-CM | POA: Diagnosis not present

## 2016-10-05 DIAGNOSIS — I10 Essential (primary) hypertension: Secondary | ICD-10-CM | POA: Diagnosis not present

## 2016-10-05 DIAGNOSIS — G40909 Epilepsy, unspecified, not intractable, without status epilepticus: Secondary | ICD-10-CM | POA: Diagnosis not present

## 2016-10-05 DIAGNOSIS — I69398 Other sequelae of cerebral infarction: Secondary | ICD-10-CM | POA: Diagnosis not present

## 2016-10-08 DIAGNOSIS — M6281 Muscle weakness (generalized): Secondary | ICD-10-CM | POA: Diagnosis not present

## 2016-10-08 DIAGNOSIS — I10 Essential (primary) hypertension: Secondary | ICD-10-CM | POA: Diagnosis not present

## 2016-10-08 DIAGNOSIS — E1140 Type 2 diabetes mellitus with diabetic neuropathy, unspecified: Secondary | ICD-10-CM | POA: Diagnosis not present

## 2016-10-08 DIAGNOSIS — M159 Polyosteoarthritis, unspecified: Secondary | ICD-10-CM | POA: Diagnosis not present

## 2016-10-08 DIAGNOSIS — I69398 Other sequelae of cerebral infarction: Secondary | ICD-10-CM | POA: Diagnosis not present

## 2016-10-08 DIAGNOSIS — G40909 Epilepsy, unspecified, not intractable, without status epilepticus: Secondary | ICD-10-CM | POA: Diagnosis not present

## 2016-10-11 ENCOUNTER — Other Ambulatory Visit: Payer: Self-pay | Admitting: Cardiovascular Disease

## 2016-10-15 DIAGNOSIS — N184 Chronic kidney disease, stage 4 (severe): Secondary | ICD-10-CM | POA: Diagnosis not present

## 2016-10-20 ENCOUNTER — Ambulatory Visit (INDEPENDENT_AMBULATORY_CARE_PROVIDER_SITE_OTHER): Payer: Medicare Other | Admitting: Urology

## 2016-10-20 DIAGNOSIS — R351 Nocturia: Secondary | ICD-10-CM

## 2016-10-20 DIAGNOSIS — N401 Enlarged prostate with lower urinary tract symptoms: Secondary | ICD-10-CM | POA: Diagnosis not present

## 2016-10-21 DIAGNOSIS — L929 Granulomatous disorder of the skin and subcutaneous tissue, unspecified: Secondary | ICD-10-CM | POA: Diagnosis not present

## 2016-11-05 DIAGNOSIS — R809 Proteinuria, unspecified: Secondary | ICD-10-CM | POA: Diagnosis not present

## 2016-11-05 DIAGNOSIS — E559 Vitamin D deficiency, unspecified: Secondary | ICD-10-CM | POA: Diagnosis not present

## 2016-11-05 DIAGNOSIS — N184 Chronic kidney disease, stage 4 (severe): Secondary | ICD-10-CM | POA: Diagnosis not present

## 2016-11-05 DIAGNOSIS — Z79899 Other long term (current) drug therapy: Secondary | ICD-10-CM | POA: Diagnosis not present

## 2016-11-18 ENCOUNTER — Telehealth: Payer: Self-pay

## 2016-11-19 ENCOUNTER — Other Ambulatory Visit: Payer: Self-pay | Admitting: Family Medicine

## 2016-11-19 MED ORDER — CLOTRIMAZOLE-BETAMETHASONE 1-0.05 % EX CREA: TOPICAL_CREAM | CUTANEOUS | 0 refills | Status: DC

## 2016-11-19 NOTE — Telephone Encounter (Signed)
pls advise and let him know med has been sent

## 2016-11-19 NOTE — Telephone Encounter (Signed)
Wife aware

## 2016-11-19 NOTE — Progress Notes (Signed)
clotrimaz/beta

## 2016-11-20 ENCOUNTER — Telehealth (HOSPITAL_COMMUNITY): Payer: Self-pay | Admitting: *Deleted

## 2016-11-20 DIAGNOSIS — E1142 Type 2 diabetes mellitus with diabetic polyneuropathy: Secondary | ICD-10-CM | POA: Diagnosis not present

## 2016-11-20 DIAGNOSIS — B351 Tinea unguium: Secondary | ICD-10-CM | POA: Diagnosis not present

## 2016-11-20 NOTE — Telephone Encounter (Signed)
erroneous

## 2016-11-20 NOTE — Telephone Encounter (Signed)
Left voice mail asking for a return call to discuss January appointment.

## 2016-11-23 ENCOUNTER — Telehealth (HOSPITAL_COMMUNITY): Payer: Self-pay | Admitting: *Deleted

## 2016-11-23 NOTE — Telephone Encounter (Signed)
Spoke with wife to cancel January 5th  appointments.  Explained new medical guidelines and carotid surveillance guidelines are now q5.  Explained if patient experiences symptoms such as stroke/TIA etc. to call office and/or 911 as situation warrants.

## 2016-11-27 ENCOUNTER — Encounter (HOSPITAL_COMMUNITY): Payer: Medicare Other

## 2016-11-27 ENCOUNTER — Ambulatory Visit: Payer: Medicare Other | Admitting: Vascular Surgery

## 2016-12-02 ENCOUNTER — Other Ambulatory Visit: Payer: Self-pay | Admitting: Family Medicine

## 2016-12-03 NOTE — Telephone Encounter (Signed)
Refill x 2 please.I will send a msg to the vascular Doc to ensure that he does want pt to remain on the medication also Pt had appt with vascular specialist on 12/17 which he missed, he needs to reschedule and keep appt with Dr Bridgett Larsson in [place], you will need to explain this to his wife. Remind him of need to keep appt here in Jan also pls

## 2016-12-03 NOTE — Telephone Encounter (Signed)
Does pt still need to be on this med?

## 2016-12-04 ENCOUNTER — Other Ambulatory Visit: Payer: Self-pay | Admitting: Family Medicine

## 2016-12-04 NOTE — Telephone Encounter (Signed)
I have attempted to call pt, there is no answer, and no answering machine.

## 2016-12-07 ENCOUNTER — Other Ambulatory Visit: Payer: Self-pay | Admitting: Family Medicine

## 2016-12-07 DIAGNOSIS — I10 Essential (primary) hypertension: Secondary | ICD-10-CM

## 2016-12-10 DIAGNOSIS — N184 Chronic kidney disease, stage 4 (severe): Secondary | ICD-10-CM | POA: Diagnosis not present

## 2016-12-11 ENCOUNTER — Inpatient Hospital Stay (HOSPITAL_COMMUNITY)
Admission: RE | Admit: 2016-12-11 | Discharge: 2016-12-11 | Disposition: A | Discharge status: Home / Self Care | Payer: Medicare Other | Source: Ambulatory Visit | Attending: Vascular Surgery | Admitting: Vascular Surgery

## 2016-12-11 ENCOUNTER — Ambulatory Visit: Payer: Medicare Other | Admitting: Vascular Surgery

## 2016-12-11 DIAGNOSIS — I6523 Occlusion and stenosis of bilateral carotid arteries: Secondary | ICD-10-CM

## 2016-12-11 DIAGNOSIS — I872 Venous insufficiency (chronic) (peripheral): Secondary | ICD-10-CM

## 2016-12-19 ENCOUNTER — Other Ambulatory Visit: Payer: Self-pay | Admitting: "Endocrinology

## 2016-12-24 ENCOUNTER — Ambulatory Visit: Payer: Medicare Other | Admitting: Family Medicine

## 2016-12-26 ENCOUNTER — Other Ambulatory Visit: Payer: Self-pay | Admitting: "Endocrinology

## 2016-12-28 DIAGNOSIS — N184 Chronic kidney disease, stage 4 (severe): Secondary | ICD-10-CM | POA: Diagnosis not present

## 2017-01-07 ENCOUNTER — Telehealth: Payer: Self-pay | Admitting: "Endocrinology

## 2017-01-07 MED ORDER — GLUCOSE BLOOD VI STRP: ORAL_STRIP | 0 refills | Status: DC

## 2017-01-07 NOTE — Telephone Encounter (Signed)
Patinet's wife needs his test strips to be called into a local pharmacy since his mail order pharmacy will not send these test strips now. He has a Scientist, clinical (histocompatibility and immunogenetics). Please call in to the [facility].

## 2017-01-08 ENCOUNTER — Other Ambulatory Visit: Payer: Self-pay

## 2017-01-08 MED ORDER — BLOOD GLUCOSE MONITOR KIT: PACK | 0 refills | Status: DC

## 2017-01-13 ENCOUNTER — Ambulatory Visit: Payer: Medicare Other | Admitting: Family Medicine

## 2017-01-13 ENCOUNTER — Encounter: Payer: Self-pay | Admitting: Family Medicine

## 2017-01-14 DIAGNOSIS — Z79899 Other long term (current) drug therapy: Secondary | ICD-10-CM | POA: Diagnosis not present

## 2017-01-14 DIAGNOSIS — E559 Vitamin D deficiency, unspecified: Secondary | ICD-10-CM | POA: Diagnosis not present

## 2017-01-14 DIAGNOSIS — R809 Proteinuria, unspecified: Secondary | ICD-10-CM | POA: Diagnosis not present

## 2017-01-14 DIAGNOSIS — N184 Chronic kidney disease, stage 4 (severe): Secondary | ICD-10-CM | POA: Diagnosis not present

## 2017-01-21 ENCOUNTER — Other Ambulatory Visit: Payer: Self-pay | Admitting: Family Medicine

## 2017-01-25 ENCOUNTER — Encounter: Payer: Self-pay | Admitting: Family Medicine

## 2017-01-25 ENCOUNTER — Ambulatory Visit (INDEPENDENT_AMBULATORY_CARE_PROVIDER_SITE_OTHER): Payer: Medicare Other | Admitting: Family Medicine

## 2017-01-25 VITALS — BP 130/80 | HR 100 | Temp 98.8°F | Resp 20 | Ht 68.0 in | Wt 177.1 lb

## 2017-01-25 DIAGNOSIS — H1013 Acute atopic conjunctivitis, bilateral: Secondary | ICD-10-CM | POA: Diagnosis not present

## 2017-01-25 DIAGNOSIS — J309 Allergic rhinitis, unspecified: Secondary | ICD-10-CM | POA: Diagnosis not present

## 2017-01-25 DIAGNOSIS — L853 Xerosis cutis: Secondary | ICD-10-CM | POA: Diagnosis not present

## 2017-01-25 DIAGNOSIS — Z794 Long term (current) use of insulin: Secondary | ICD-10-CM | POA: Diagnosis not present

## 2017-01-25 DIAGNOSIS — E1140 Type 2 diabetes mellitus with diabetic neuropathy, unspecified: Secondary | ICD-10-CM

## 2017-01-25 DIAGNOSIS — N184 Chronic kidney disease, stage 4 (severe): Secondary | ICD-10-CM

## 2017-01-25 MED ORDER — TRIAMCINOLONE ACETONIDE 55 MCG/ACT NA AERO: 2.0000 | INHALATION_SPRAY | Freq: Every day | NASAL | 12 refills | Status: DC

## 2017-01-25 MED ORDER — OLOPATADINE HCL 0.1 % OP SOLN: 1.0000 [drp] | Freq: Two times a day (BID) | OPHTHALMIC | 12 refills | Status: DC

## 2017-01-25 MED ORDER — MOMETASONE FUROATE 0.1 % EX CREA: 1.0000 "application " | TOPICAL_CREAM | Freq: Every day | CUTANEOUS | 0 refills | Status: DC

## 2017-01-25 NOTE — Patient Instructions (Addendum)
Take a claritin every other day Use the nasacort spray every day Use the allergy eye drop daily  Use the elocon on the skin rash daily Use lotion in addition aveeno ant itch is good DO NOTSCRATCH Cut fingernails short  See Dr Moshe Cipro in a couple of weeks

## 2017-01-25 NOTE — Progress Notes (Signed)
Chief Complaint  Patient presents with  . Rash    right neck x 2 months    Complicated elderly gentleman here for evaluation of a rash, and for allergies. These are both long-standing problems of many months duration. His usual primary care doctor, Tula Nakayama, is not available today. He is brought in by his daughter asking for help. Winner has a rash around his neck and chest. He digs at all the time. He scratches the skin opening. It's been there for months. They have been using a variety of lotions. They tried Lotrisone. It is no better. There are no blisters or pustules. It appears to be stable without widespread. He states that it itches terribly. He also has chronic clear rhinorrhea. He blows his nose and snorts continually throughout the day. His eyes are red and teary. His eyelids are thickened and dark. Daughter states they're using an unknown nasal spray at home. He is not on any other allergy medicine. Treating this patient is complicated by the fact that he is in end-stage renal failure. His last glomerular filtration rate was about 15%. Family does not want dialysis. Any medications must be chosen carefully. He has had no recent medication changes. No new foods or supplements. No new soaps or lotions. No recent illness. No falls.   Patient Active Problem List   Diagnosis Date Noted  . Essential hypertension, benign 12/13/2015  . Medicare annual wellness visit, subsequent 07/14/2015  . Knee pain, left 04/02/2015  . Calculus of gallbladder with acute cholecystitis without obstruction   . RBBB (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 fascicular block)   . Hypertensive heart disease 12/01/2014  . Chronic venous insufficiency 11/16/2014  . Carotid artery disease ([place]) 05/18/2014  . Poor balance 09/11/2013  . Difficulty walking 09/11/2013  . Hx of falling 09/11/2013  . History of stroke 09/07/2012  . CKD (chronic kidney disease) stage 4, GFR 15-29 ml/min (HCC)  09/07/2012  . Anemia 09/07/2012  . Cholelithiasis 08/10/2012  . Peripheral autonomic neuropathy due to diabetes mellitus ([place]) 03/14/2012  . Seizure disorder, complex partial ([place]) 03/07/2012  . Uncontrolled type 2 diabetes mellitus with stage 4 chronic kidney disease ([place])   . Hypothyroidism 03/09/2008  . Hyperlipidemia 03/09/2008  . Renovascular hypertension 03/09/2008  . Peripheral vascular disease ([place]) 03/09/2008  . DEGENERATIVE JOINT DISEASE, KNEE 01/26/2008  . Osteoarthritis of spine 01/26/2008  . Spinal stenosis     Outpatient Encounter Prescriptions as of 01/25/2017  Medication Sig  . amLODipine (NORVASC) 10 MG tablet TAKE ONE TABLET BY MOUTH ONCE DAILY  . aspirin EC 81 MG tablet Take 1 tablet (81 mg total) by mouth daily.  . blood glucose meter kit and supplies KIT Dispense based on patient and insurance preference. Use up to two times daily as directed. (FOR ICD 10 E11.65)  . chlorthalidone (HYGROTON) 50 MG tablet TAKE ONE TABLET BY MOUTH ONCE DAILY  . clopidogrel (PLAVIX) 75 MG tablet TAKE ONE TABLET BY MOUTH ONCE DAILY  . folic acid (FOLVITE) 1 MG tablet Take 1 tablet (1 mg total) by mouth daily.  Marland Kitchen glucose blood test strip Use as instructed bid. E11.65. Prodigy  . HUMALOG MIX 75/25 KWIKPEN (75-25) 100 UNIT/ML Kwikpen INJECT 10 UNITS SUBCUTANEOUSLY TWICE DAILY  . hydrALAZINE (APRESOLINE) 50 MG tablet TAKE ONE TABLET BY MOUTH TWICE DAILY  . levETIRAcetam (KEPPRA) 500 MG tablet Take 250 mg by mouth at bedtime.   Marland Kitchen levothyroxine (SYNTHROID, LEVOTHROID) 175 MCG tablet TAKE ONE TABLET BY MOUTH  IN THE MORNING  . lovastatin (MEVACOR) 40 MG tablet TAKE ONE TABLET BY MOUTH AT BEDTIME  . Multiple Vitamins-Minerals (EYE VITAMINS PO) Take 1 tablet by mouth daily.   . tamsulosin (FLOMAX) 0.4 MG CAPS capsule Take 0.4 mg by mouth.  . Vitamin D, Ergocalciferol, (DRISDOL) 50000 units CAPS capsule Take 1 capsule (50,000 Units total) by mouth once a week.  . mometasone (ELOCON) 0.1 % cream  Apply 1 application topically daily.  Marland Kitchen olopatadine (PATANOL) 0.1 % ophthalmic solution Place 1 drop into both eyes 2 (two) times daily.  Glory Rosebush DELICA LANCETS 59D MISC   . torsemide (DEMADEX) 20 MG tablet Take 20 mg by mouth once.   . triamcinolone (NASACORT AQ) 55 MCG/ACT AERO nasal inhaler Place 2 sprays into the nose daily.   No facility-administered encounter medications on file as of 01/25/2017.     Allergies  Allergen Reactions  . Bayer Advanced Aspirin [Aspirin] Nausea And Vomiting  . Penicillins Nausea And Vomiting    Review of Systems  Constitutional: Negative for activity change, appetite change and unexpected weight change.  HENT: Positive for congestion, postnasal drip and rhinorrhea. Negative for hearing loss and sore throat.   Eyes: Positive for discharge, redness and itching. Negative for visual disturbance.  Respiratory: Negative for cough and shortness of breath.   Cardiovascular: Positive for leg swelling. Negative for chest pain.  Gastrointestinal: Negative for constipation and diarrhea.  Genitourinary: Negative for decreased urine volume.  Musculoskeletal: Positive for gait problem.  Skin: Positive for rash and wound.       Rash as described. Wound on abdomen, chronic  Psychiatric/Behavioral: Negative for dysphoric mood and sleep disturbance.       Denies memory impairment or mood change   BP 130/80 (BP Location: Left Arm, Patient Position: Sitting, Cuff Size: Normal)   Pulse 100   Temp 98.8 F (37.1 C) (Temporal)   Resp 20   Ht '5\' 8"'  (1.727 m)   Wt 177 lb 1.9 oz (80.3 kg)   SpO2 100%   BMI 26.93 kg/m   Physical Exam  Constitutional: He appears well-developed and well-nourished. No distress.  Elderly gentleman. Stooped posture. Rolling walker. Antalgic gait.  HENT:  Head: Normocephalic and atraumatic.  Right Ear: External ear normal.  Left Ear: External ear normal.  Mouth/Throat: Oropharynx is clear and moist.  Nasal membranes swollen and pale.  Clear rhinorrhea  Eyes: Pupils are equal, round, and reactive to light. Right eye exhibits no discharge. Right conjunctiva is injected. Left conjunctiva is injected.  Eyelids hyperpigmented, wrinkled, left upper lid swollen greater than right  Neck: Normal range of motion.  Cardiovascular: Normal rate, regular rhythm and normal heart sounds.   Pulmonary/Chest: Effort normal and breath sounds normal. He has no wheezes.  Abdominal: Soft. Bowel sounds are normal. There is no tenderness.  Draining wound right upper quadrant  Lymphadenopathy:    He has no cervical adenopathy.  Neurological: He is alert.  Skin: Skin is warm and dry. Rash noted.  Hyperpigmented dermatitis neck, upper back, and anterior chest from dry skin and scratching  Psychiatric: He has a normal mood and affect. His behavior is normal.  Slow responses, looks to daughter for answers    ASSESSMENT/PLAN:  1. Dry skin dermatitis Must use lotions frequently. Use Elocon once a day. Use an antihistamine to reduce itching. Cut fingernails short period  2. Allergic conjunctivitis of both eyes and rhinitis Antihistamine Claritin. Take every other day because of reduced renal function. May use the Nasacort  and allergy eyedrops daily.  Patient Instructions  Take a claritin every other day Use the nasacort spray every day Use the allergy eye drop daily  Use the elocon on the skin rash daily Use lotion in addition aveeno ant itch is good DO NOTSCRATCH Cut fingernails short  See Dr Moshe Cipro in a couple of weeks   Raylene Everts, MD

## 2017-01-29 ENCOUNTER — Other Ambulatory Visit: Payer: Self-pay | Admitting: "Endocrinology

## 2017-01-29 DIAGNOSIS — B351 Tinea unguium: Secondary | ICD-10-CM | POA: Diagnosis not present

## 2017-01-29 DIAGNOSIS — N184 Chronic kidney disease, stage 4 (severe): Secondary | ICD-10-CM | POA: Diagnosis not present

## 2017-01-29 DIAGNOSIS — E1122 Type 2 diabetes mellitus with diabetic chronic kidney disease: Secondary | ICD-10-CM | POA: Diagnosis not present

## 2017-01-29 DIAGNOSIS — E038 Other specified hypothyroidism: Secondary | ICD-10-CM | POA: Diagnosis not present

## 2017-01-29 DIAGNOSIS — E1165 Type 2 diabetes mellitus with hyperglycemia: Secondary | ICD-10-CM | POA: Diagnosis not present

## 2017-01-29 DIAGNOSIS — E1142 Type 2 diabetes mellitus with diabetic polyneuropathy: Secondary | ICD-10-CM | POA: Diagnosis not present

## 2017-01-30 LAB — COMPLETE METABOLIC PANEL WITH GFR
ALT: 7 U/L — ABNORMAL LOW (ref 9–46)
AST: 16 U/L (ref 10–35)
Albumin: 3.7 g/dL (ref 3.6–5.1)
Alkaline Phosphatase: 83 U/L (ref 40–115)
BUN: 44 mg/dL — ABNORMAL HIGH (ref 7–25)
CO2: 20 mmol/L (ref 20–31)
Calcium: 8.9 mg/dL (ref 8.6–10.3)
Chloride: 112 mmol/L — ABNORMAL HIGH (ref 98–110)
Creat: 3.13 mg/dL — ABNORMAL HIGH (ref 0.70–1.11)
GFR, Est African American: 20 mL/min — ABNORMAL LOW (ref >=60)
GFR, Est Non African American: 17 mL/min — ABNORMAL LOW (ref >=60)
Glucose, Bld: 124 mg/dL — ABNORMAL HIGH (ref 65–99)
Potassium: 4.5 mmol/L (ref 3.5–5.3)
Sodium: 140 mmol/L (ref 135–146)
Total Bilirubin: 0.3 mg/dL (ref 0.2–1.2)
Total Protein: 6.5 g/dL (ref 6.1–8.1)

## 2017-01-30 LAB — HEMOGLOBIN A1C
Hgb A1c MFr Bld: 5.5 % (ref <5.7)
Mean Plasma Glucose: 111 mg/dL

## 2017-01-30 LAB — TSH: TSH: 0.03 mIU/L — ABNORMAL LOW (ref 0.40–4.50)

## 2017-01-30 LAB — T4, FREE: Free T4: 1.7 ng/dL (ref 0.8–1.8)

## 2017-02-04 ENCOUNTER — Ambulatory Visit (INDEPENDENT_AMBULATORY_CARE_PROVIDER_SITE_OTHER): Payer: Medicare Other | Admitting: "Endocrinology

## 2017-02-04 ENCOUNTER — Encounter: Payer: Self-pay | Admitting: "Endocrinology

## 2017-02-04 VITALS — BP 145/52 | HR 86 | Ht 68.0 in

## 2017-02-04 DIAGNOSIS — R809 Proteinuria, unspecified: Secondary | ICD-10-CM | POA: Diagnosis not present

## 2017-02-04 DIAGNOSIS — E782 Mixed hyperlipidemia: Secondary | ICD-10-CM | POA: Diagnosis not present

## 2017-02-04 DIAGNOSIS — Z79899 Other long term (current) drug therapy: Secondary | ICD-10-CM | POA: Diagnosis not present

## 2017-02-04 DIAGNOSIS — I6523 Occlusion and stenosis of bilateral carotid arteries: Secondary | ICD-10-CM | POA: Diagnosis not present

## 2017-02-04 DIAGNOSIS — E559 Vitamin D deficiency, unspecified: Secondary | ICD-10-CM | POA: Diagnosis not present

## 2017-02-04 DIAGNOSIS — N184 Chronic kidney disease, stage 4 (severe): Secondary | ICD-10-CM

## 2017-02-04 DIAGNOSIS — E1122 Type 2 diabetes mellitus with diabetic chronic kidney disease: Secondary | ICD-10-CM | POA: Diagnosis not present

## 2017-02-04 DIAGNOSIS — E038 Other specified hypothyroidism: Secondary | ICD-10-CM | POA: Diagnosis not present

## 2017-02-04 DIAGNOSIS — I10 Essential (primary) hypertension: Secondary | ICD-10-CM | POA: Diagnosis not present

## 2017-02-04 DIAGNOSIS — E1165 Type 2 diabetes mellitus with hyperglycemia: Secondary | ICD-10-CM

## 2017-02-04 MED ORDER — LEVOTHYROXINE SODIUM 150 MCG PO TABS: 150.0000 ug | ORAL_TABLET | Freq: Every morning | ORAL | 6 refills | Status: DC

## 2017-02-04 NOTE — Progress Notes (Signed)
Subjective:    Patient ID: Chase [name], male    DOB: [birth year], PCP Tula Nakayama, MD   Past Medical History:  Diagnosis Date  . Bradycardia 03/15/2012  . CKD (chronic kidney disease) stage 4, GFR 15-29 ml/min (HCC)   . Complete lesion of cervical spinal cord ([place]) 03/14/3012   Stable since 2006  . CVA (cerebrovascular accident) ([place]) 09/07/12  . Depressive disorder, not elsewhere classified   . Diabetes mellitus approx 1994  . Diabetic neuropathy ([place])   . Hypertensive heart disease   . Hypothyroidism approx 2000  . Lacunar stroke, acute ([place]) 03/14/2012  . Obesity   . Osteoarthrosis, unspecified whether generalized or localized, lower leg   . Other and unspecified hyperlipidemia   . Peripheral vascular disease, unspecified   . Seizures ([place])   . Spinal stenosis, unspecified region other than cervical   . Spondylosis of unspecified site without mention of myelopathy    Past Surgical History:  Procedure Laterality Date  . COLONOSCOPY N/A 08/10/2013   Procedure: COLONOSCOPY;  Surgeon: Rogene Houston, MD;  Location: [facility];  Service: Endoscopy;  Laterality: N/A;  240  . kidney stones left x2  1975  . KIDNEY SURGERY     Ruptured left kidney 30 yrs ago  from a kidney stone   Social History   Social History  . Marital status: Married    Spouse name: N/A  . Number of children: 5  . Years of education: N/A   Occupational History  . retired  Retired   Social History Main Topics  . Smoking status: Never Smoker  . Smokeless tobacco: Never Used  . Alcohol use No  . Drug use: No  . Sexual activity: No   Other Topics Concern  . None   Social History Narrative  . None   Outpatient Encounter Prescriptions as of 02/04/2017  Medication Sig  . amLODipine (NORVASC) 10 MG tablet TAKE ONE TABLET BY MOUTH ONCE DAILY  . aspirin EC 81 MG tablet Take 1 tablet (81 mg total) by mouth daily.  . chlorthalidone (HYGROTON) 50 MG tablet TAKE ONE TABLET BY MOUTH ONCE DAILY  .  clopidogrel (PLAVIX) 75 MG tablet TAKE ONE TABLET BY MOUTH ONCE DAILY  . HUMALOG MIX 75/25 KWIKPEN (75-25) 100 UNIT/ML Kwikpen INJECT 10 UNITS SUBCUTANEOUSLY TWICE DAILY  . hydrALAZINE (APRESOLINE) 50 MG tablet TAKE ONE TABLET BY MOUTH TWICE DAILY  . levETIRAcetam (KEPPRA) 500 MG tablet Take 250 mg by mouth at bedtime.   Marland Kitchen levothyroxine (SYNTHROID, LEVOTHROID) 150 MCG tablet Take 1 tablet (150 mcg total) by mouth every morning.  . lovastatin (MEVACOR) 40 MG tablet TAKE ONE TABLET BY MOUTH AT BEDTIME  . mometasone (ELOCON) 0.1 % cream Apply 1 application topically daily.  . Multiple Vitamins-Minerals (EYE VITAMINS PO) Take 1 tablet by mouth daily.   Marland Kitchen olopatadine (PATANOL) 0.1 % ophthalmic solution Place 1 drop into both eyes 2 (two) times daily.  . tamsulosin (FLOMAX) 0.4 MG CAPS capsule Take 0.4 mg by mouth.  . Vitamin D, Ergocalciferol, (DRISDOL) 50000 units CAPS capsule Take 1 capsule (50,000 Units total) by mouth once a week.  . [DISCONTINUED] levothyroxine (SYNTHROID, LEVOTHROID) 175 MCG tablet TAKE ONE TABLET BY MOUTH IN THE MORNING  . blood glucose meter kit and supplies KIT Dispense based on patient and insurance preference. Use up to two times daily as directed. (FOR ICD 10 E11.65)  . folic acid (FOLVITE) 1 MG tablet Take 1 tablet (1 mg total) by mouth  daily.  . glucose blood test strip Use as instructed bid. E11.65. Prodigy  . ONETOUCH DELICA LANCETS 27M MISC   . [DISCONTINUED] torsemide (DEMADEX) 20 MG tablet Take 20 mg by mouth once.   . [DISCONTINUED] triamcinolone (NASACORT AQ) 55 MCG/ACT AERO nasal inhaler Place 2 sprays into the nose daily.   No facility-administered encounter medications on file as of 02/04/2017.    ALLERGIES: Allergies  Allergen Reactions  . Bayer Advanced Aspirin [Aspirin] Nausea And Vomiting  . Penicillins Nausea And Vomiting   VACCINATION STATUS: Immunization History  Administered Date(s) Administered  . H1N1 11/14/2008  . Influenza Split 10/07/2011,  09/08/2012  . Influenza Whole 08/22/2007, 08/27/2010  . Influenza,inj,Quad PF,36+ Mos 08/22/2013, 09/26/2014, 09/26/2015, 09/01/2016  . Pneumococcal Conjugate-13 06/12/2014  . Pneumococcal Polysaccharide-23 05/21/2004  . Td 05/21/2004  . Tdap 10/07/2011    Diabetes  He presents for his follow-up diabetic visit. He has type 2 diabetes mellitus. Onset time: He was diagnosed at approximate age of 21 years. His disease course has been improving. There are no hypoglycemic associated symptoms. Pertinent negatives for hypoglycemia include no confusion, headaches, pallor or seizures. There are no diabetic associated symptoms. Pertinent negatives for diabetes include no chest pain, no fatigue, no polydipsia, no polyphagia, no polyuria and no weakness. There are no hypoglycemic complications. Symptoms are improving. Diabetic complications include nephropathy and PVD. Risk factors for coronary artery disease include diabetes mellitus, dyslipidemia, male sex, obesity and sedentary lifestyle. Current diabetic treatment includes insulin injections. He is compliant with treatment most of the time. His weight is decreasing steadily. He is following a diabetic diet. When asked about meal planning, he reported none. He never participates in exercise. His home blood glucose trend is decreasing steadily. His breakfast blood glucose range is generally 140-180 mg/dl. His dinner blood glucose range is generally 140-180 mg/dl.  Hyperlipidemia  This is a chronic problem. The current episode started more than 1 year ago. Pertinent negatives include no chest pain, myalgias or shortness of breath. Current antihyperlipidemic treatment includes statins.  Hypertension  This is a chronic problem. The current episode started more than 1 year ago. Pertinent negatives include no chest pain, headaches, neck pain, palpitations or shortness of breath. Past treatments include direct vasodilators. Hypertensive end-organ damage includes PVD.  Identifiable causes of hypertension include a thyroid problem.  Thyroid Problem  Presents for follow-up visit. Patient reports no constipation, diarrhea, fatigue or palpitations. The symptoms have been stable. Past treatments include levothyroxine. His past medical history is significant for hyperlipidemia.     Review of Systems  Constitutional: Negative for chills, fatigue, fever and unexpected weight change.  HENT: Negative for dental problem, mouth sores and trouble swallowing.   Eyes: Negative for visual disturbance.  Respiratory: Negative for cough, choking, chest tightness, shortness of breath and wheezing.   Cardiovascular: Negative for chest pain, palpitations and leg swelling.  Gastrointestinal: Negative for abdominal distention, abdominal pain, constipation, diarrhea, nausea and vomiting.  Endocrine: Negative for polydipsia, polyphagia and polyuria.  Genitourinary: Negative for dysuria, flank pain, hematuria and urgency.  Musculoskeletal: Positive for gait problem. Negative for back pain, myalgias and neck pain.       Walks with a walker, due to disequilibrium and arthritis.  Skin: Negative for pallor, rash and wound.  Neurological: Negative for seizures, syncope, weakness, numbness and headaches.  Psychiatric/Behavioral: Negative.  Negative for confusion and dysphoric mood.    Objective:    BP (!) 145/52   Pulse 86   Ht '5\' 8"'  (1.727 m)  Wt Readings from Last 3 Encounters:  01/25/17 177 lb 1.9 oz (80.3 kg)  09/01/16 182 lb (82.6 kg)  07/27/16 178 lb (80.7 kg)    Physical Exam  Constitutional: He is oriented to person, place, and time. He appears well-developed and well-nourished. He is cooperative. No distress.  HENT:  Head: Normocephalic and atraumatic.  Eyes: EOM are normal.  Neck: Normal range of motion. Neck supple. No tracheal deviation present. No thyromegaly present.  Cardiovascular: Normal rate, S1 normal, S2 normal and normal heart sounds.  Exam reveals no  gallop.   No murmur heard. Pulses:      Dorsalis pedis pulses are 1+ on the right side, and 1+ on the left side.       Posterior tibial pulses are 1+ on the right side, and 1+ on the left side.  Pulmonary/Chest: Breath sounds normal. No respiratory distress. He has no wheezes.  Abdominal: Soft. Bowel sounds are normal. He exhibits no distension. There is no tenderness. There is no guarding and no CVA tenderness.  Musculoskeletal: He exhibits no edema.       Right shoulder: He exhibits no deformity.       Left ankle: He exhibits no swelling.       Right foot: There is no swelling.       Left foot: There is no swelling.  Neurological: He is alert and oriented to person, place, and time. He has normal strength and normal reflexes. No cranial nerve deficit or sensory deficit. Gait normal.  Skin: Skin is warm and dry. No rash noted. No cyanosis. Nails show no clubbing.  Psychiatric: He has a normal mood and affect. His speech is normal. Cognition and memory are normal.  He has moderate cognitive deficit.     CMP ( most recent) CMP     Component Value Date/Time   NA 140 01/29/2017 1349   K 4.5 01/29/2017 1349   CL 112 (H) 01/29/2017 1349   CO2 20 01/29/2017 1349   GLUCOSE 124 (H) 01/29/2017 1349   BUN 44 (H) 01/29/2017 1349   CREATININE 3.13 (H) 01/29/2017 1349   CALCIUM 8.9 01/29/2017 1349   PROT 6.5 01/29/2017 1349   ALBUMIN 3.7 01/29/2017 1349   AST 16 01/29/2017 1349   ALT 7 (L) 01/29/2017 1349   ALKPHOS 83 01/29/2017 1349   BILITOT 0.3 01/29/2017 1349   GFRNONAA 17 (L) 01/29/2017 1349   GFRAA 20 (L) 01/29/2017 1349     Diabetic Labs (most recent): Lab Results  Component Value Date   HGBA1C 5.5 01/29/2017   HGBA1C 5.7 (H) 07/30/2016   HGBA1C 6.0 (H) 04/13/2016     Lipid Panel ( most recent) Lipid Panel     Component Value Date/Time   CHOL 172 03/12/2016 1139   TRIG 38 03/12/2016 1139   HDL 80 03/12/2016 1139   CHOLHDL 2.2 03/12/2016 1139   VLDL 8 03/12/2016  1139   LDLCALC 84 03/12/2016 1139      Assessment & Plan:   1.  type 2 diabetes mellitus with stage 4 chronic kidney disease, with long-term current use of insulin (HCC) -his diabetes is  complicated by chronic kidney disease stage IV and patient remains at a high risk for more acute and chronic complications of diabetes which include CAD, CVA, CKD, retinopathy, and neuropathy. These are all discussed in detail with the patient.  Patient came with controlled glucose profile, and  recent A1c of 5.5 %.  Glucose logs and insulin administration records pertaining to  this visit,  to be scanned into patient's records.  Recent labs reviewed.   - I have re-counseled the patient on diet management   by adopting a carbohydrate restricted / protein rich  Diet.  - Suggestion is made for patient to avoid simple carbohydrates   from their diet including Cakes , Desserts, Ice Cream,  Soda (  diet and regular) , Sweet Tea , Candies,  Chips, Cookies, Artificial Sweeteners,   and "Sugar-free" Products .  This will help patient to have stable blood glucose profile and potentially avoid unintended  Weight gain.  - Patient is advised to stick to a routine mealtimes to eat 3 meals  a day and avoid unnecessary snacks ( to snack only to correct hypoglycemia).  - I have approached patient with the following individualized plan to manage diabetes and patient agrees.  - His wife misread last visit instructions and continued to give him Humalog 75/25  15 units twice a day, I advised her to lower that to 10 units BID associated with monitoring of BG at least bid, for pre-meal blood glucose above 90 mg/dL.   - Patient is warned not to take insulin without proper monitoring per orders. -Adjustment parameters are given for hypo and hyperglycemia in writing. -Patient is encouraged to call clinic for blood glucose levels less than 70 or above 300 mg /dl. -Patient is not a candidate for metformin,SGLT2 inhibitors due to  CKD.  - Patient specific target  for A1c; LDL, HDL, Triglycerides, and  Waist Circumference were discussed in detail.  2) BP/HTN: Controlled. Continue current medications. 3) Lipids/HPL:  continue statins.  4) hypothyroidism:  - His thyroid function tests are consistent with over- replacement with thyroid hormone. I will lower her levothyroxine to 150 g by mouth every morning.   - We discussed about correct intake of levothyroxine, at fasting, with water, separated by at least 30 minutes from breakfast, and separated by more than 4 hours from calcium, iron, multivitamins, acid reflux medications (PPIs). -Patient is made aware of the fact that thyroid hormone replacement is needed for life, dose to be adjusted by periodic monitoring of thyroid function tests.  5) Chronic Care/Health Maintenance:  -Patient is on Statin medications and encouraged to continue to follow up with Ophthalmology, Podiatrist at least yearly or according to recommendations, and advised to  stay away from smoking. I have recommended yearly flu vaccine and pneumonia vaccination at least every 5 years; he cannot exercise optimally due to deconditioning and disequilibrium, and  sleep for at least 7 hours a day.  - 25 minutes of time was spent on the care of this patient , 50% of which was applied for counseling on diabetes complications and their preventions.  - I advised patient to maintain close follow up with Tula Nakayama, MD for primary care needs.  Patient is asked to bring meter and  blood glucose logs during their next visit.   Follow up plan: -Return in about 6 months (around 08/07/2017) for follow up with pre-visit labs, meter, and logs.  Glade Lloyd, MD Phone: [phone]  Fax: [phone]   02/04/2017, 11:51 AM

## 2017-02-09 ENCOUNTER — Ambulatory Visit: Payer: Medicare Other | Admitting: Family Medicine

## 2017-02-09 ENCOUNTER — Encounter: Payer: Self-pay | Admitting: Family Medicine

## 2017-02-09 DIAGNOSIS — E1129 Type 2 diabetes mellitus with other diabetic kidney complication: Secondary | ICD-10-CM | POA: Diagnosis not present

## 2017-02-09 DIAGNOSIS — I10 Essential (primary) hypertension: Secondary | ICD-10-CM | POA: Diagnosis not present

## 2017-02-09 DIAGNOSIS — E872 Acidosis: Secondary | ICD-10-CM | POA: Diagnosis not present

## 2017-02-09 DIAGNOSIS — N184 Chronic kidney disease, stage 4 (severe): Secondary | ICD-10-CM | POA: Diagnosis not present

## 2017-02-11 ENCOUNTER — Other Ambulatory Visit: Payer: Self-pay | Admitting: Cardiovascular Disease

## 2017-02-25 ENCOUNTER — Ambulatory Visit (INDEPENDENT_AMBULATORY_CARE_PROVIDER_SITE_OTHER): Payer: Medicare Other | Admitting: Family Medicine

## 2017-02-25 ENCOUNTER — Encounter: Payer: Self-pay | Admitting: Family Medicine

## 2017-02-25 VITALS — BP 140/74 | HR 77 | Resp 16 | Ht 68.0 in | Wt 172.0 lb

## 2017-02-25 DIAGNOSIS — L309 Dermatitis, unspecified: Secondary | ICD-10-CM | POA: Diagnosis not present

## 2017-02-25 DIAGNOSIS — N184 Chronic kidney disease, stage 4 (severe): Secondary | ICD-10-CM | POA: Diagnosis not present

## 2017-02-25 DIAGNOSIS — K8020 Calculus of gallbladder without cholecystitis without obstruction: Secondary | ICD-10-CM

## 2017-02-25 DIAGNOSIS — E1021 Type 1 diabetes mellitus with diabetic nephropathy: Secondary | ICD-10-CM | POA: Diagnosis not present

## 2017-02-25 DIAGNOSIS — I150 Renovascular hypertension: Secondary | ICD-10-CM

## 2017-02-25 DIAGNOSIS — I779 Disorder of arteries and arterioles, unspecified: Secondary | ICD-10-CM | POA: Diagnosis not present

## 2017-02-25 DIAGNOSIS — Z794 Long term (current) use of insulin: Secondary | ICD-10-CM

## 2017-02-25 DIAGNOSIS — G40209 Localization-related (focal) (partial) symptomatic epilepsy and epileptic syndromes with complex partial seizures, not intractable, without status epilepticus: Secondary | ICD-10-CM | POA: Diagnosis not present

## 2017-02-25 DIAGNOSIS — E1122 Type 2 diabetes mellitus with diabetic chronic kidney disease: Secondary | ICD-10-CM

## 2017-02-25 DIAGNOSIS — I739 Peripheral vascular disease, unspecified: Secondary | ICD-10-CM

## 2017-02-25 DIAGNOSIS — I6523 Occlusion and stenosis of bilateral carotid arteries: Secondary | ICD-10-CM | POA: Diagnosis not present

## 2017-02-25 DIAGNOSIS — E038 Other specified hypothyroidism: Secondary | ICD-10-CM | POA: Diagnosis not present

## 2017-02-25 DIAGNOSIS — E785 Hyperlipidemia, unspecified: Secondary | ICD-10-CM

## 2017-02-25 NOTE — Patient Instructions (Addendum)
f/u with rectal in September, call if you need me before Wellness with [facility] Sept 27 or after [facility] today  Fasting lipid and cBC with next lab draw We will look at an alternate walker, m may need PT to make an assesment for best equipment for you.  You are referred for eye exam and to the dermatologist Please be careful not to fall.  You are doing ell, just need to become more active  Thank you  for choosing [facility]. We consider it a privelige to serve you.  Delivering excellent health care in a caring and  compassionate way is our goal.  Partnering with you,  so that together we can achieve this goal is our strategy.

## 2017-02-26 ENCOUNTER — Other Ambulatory Visit (HOSPITAL_COMMUNITY)
Admission: RE | Admit: 2017-02-26 | Discharge: 2017-02-26 | Disposition: A | Discharge status: Home / Self Care | Payer: Medicare Other | Source: Other Acute Inpatient Hospital | Attending: Family Medicine | Admitting: Family Medicine

## 2017-02-26 DIAGNOSIS — E1021 Type 1 diabetes mellitus with diabetic nephropathy: Secondary | ICD-10-CM | POA: Insufficient documentation

## 2017-02-27 LAB — MICROALBUMIN / CREATININE URINE RATIO
Creatinine, Urine: 69.8 mg/dL
Microalb Creat Ratio: 278.2 mg/g creat — ABNORMAL HIGH (ref 0.0–30.0)
Microalb, Ur: 194.2 ug/mL — ABNORMAL HIGH

## 2017-02-28 NOTE — Progress Notes (Signed)
Chase [name]     MRN: [id number]      DOB: [birth year]   HPI Chase [name] is here for follow up and re-evaluation of chronic medical conditions, medication management and review of any available recent lab and radiology data.  Preventive health is updated, specifically  Cancer screening and Immunization.   Questions or concerns regarding consultations or procedures which the PT has had in the interim are  addressed. The PT denies any adverse reactions to current medications since the last visit.  c/o excess clear nasal drainage and cough, no fever or chills, no sore throat or ear pain c/o over 2 month h/o pruritic rash on right neck and shoulder, no purulent drainage or skin breakdown, using topical antifungal and steroid but still symptomatic Still not eating on regular schedule which is potentially dangerous as he is on insulin, re educated about this Requests consultation with general surgeon who he has seen in the past as the ostomy is still draining 2 years post placement   ROS See HPI Spouse is the historian . Denies chest pains, palpitations and leg swelling Denies abdominal pain, nausea, vomiting,diarrhea or constipation.   Denies dysuria, frequency, hesitancy or incontinence. Denies uncontrolled  joint pain,  And does have  limitation in mobility. Denies headaches,uncontrolled eizures, has  numbness, and  tingling. Denies depression, anxiety or insomnia.  PE  BP 140/74   Pulse 77   Resp 16   Ht 5\' 8"  (1.727 m)   Wt 172 lb (78 kg)   SpO2 98%   BMI 26.15 kg/m   Patient alert and oriented and in no cardiopulmonary distress.  HEENT: No facial asymmetry, EOMI,   oropharynx pink and moist.  Neck decreased ROM no JVD, no mass.no sinus tenderness, TM clear, bilateral carotids bruits  Chest: Clear to auscultation bilaterally.  CVS: S1, S2 no murmurs, no S3.Regular rate.  ABD: Soft non tender.   Ext: No edema  MS: decreased  ROM spine, shoulders, hips and knees.  Skin:  hyper pigmented macular rash on upper anterior right chest and shoulder.  Psych: Good eye contact, flat  affect. t not anxious or depressed appearing.  CNS: CN 2-12 intact, power,  normal throughout.no focal deficits noted.   Assessment & Plan Type 2 diabetes mellitus with diabetic chronic kidney disease ([place]) Controlled,managed by endo and doing exceptionally well    Chase [name] is reminded of the importance of commitment to daily physical activity for 30 minutes or more, as able and the need to limit carbohydrate intake to 30 to 60 grams per meal to help with blood sugar control.   The need to take medication as prescribed, test blood sugar as directed, and to call between visits if there is a concern that blood sugar is uncontrolled is also discussed.   Chase [name] is reminded of the importance of daily foot exam, annual eye examination, and good blood sugar, blood pressure and cholesterol control.  Diabetic Labs Latest Ref Rng & Units 02/26/2017 01/29/2017 07/30/2016 07/28/2016 04/13/2016  HbA1c <5.7 % - 5.5 5.7(H) - 6.0(H)  Microalbumin Not Estab. ug/mL 194.2(H) - - - -  Micro/Creat Ratio 0.0 - 30.0 mg/g creat 278.2(H) - - - -  Chol 125 - 200 mg/dL - - - - -  HDL >=40 mg/dL - - - - -  Calc LDL <130 mg/dL - - - - -  Triglycerides <150 mg/dL - - - - -  Creatinine 0.70 - 1.11 mg/dL - 3.13(H) 3.48(H) 3.33(H) 3.23(H)  BP/Weight 02/25/2017 02/04/2017 01/25/2017 09/01/2016 08/05/2016 07/28/2016 5/36/6440  Systolic BP 347 425 956 387 564 332 -  Diastolic BP 74 52 80 74 82 73 -  Wt. (Lbs) 172 - 177.12 182 - - 178  BMI 26.15 - 26.93 27.67 - - 27.06   Foot/eye exam completion dates Latest Ref Rng & Units 02/25/2017 11/12/2015  Eye Exam No Retinopathy - -  Foot exam Order - - -  Foot Form Completion - Done Done        Seizure disorder, complex partial ([place]) Controlled with medication and followed y neurology  Renovascular hypertension Controlled, no change in medication DASH diet and  commitment to daily physical activity for a minimum of 30 minutes discussed and encouraged, as a part of hypertension management. The importance of attaining a healthy weight is also discussed.  BP/Weight 02/25/2017 02/04/2017 01/25/2017 09/01/2016 08/05/2016 07/28/2016 9/51/8841  Systolic BP 660 630 160 109 323 557 -  Diastolic BP 74 52 80 74 82 73 -  Wt. (Lbs) 172 - 177.12 182 - - 178  BMI 26.15 - 26.93 27.67 - - 27.06       Hypothyroidism Followed by endo, appears better controlled markedly less proptosis  Hyperlipidemia Hyperlipidemia:Low fat diet discussed and encouraged.   Lipid Panel  Lab Results  Component Value Date   CHOL 172 03/12/2016   HDL 80 03/12/2016   LDLCALC 84 03/12/2016   TRIG 38 03/12/2016   CHOLHDL 2.2 03/12/2016    Updated lab needed at/ before next visit.    Dermatitis Chronic pruritic hyperpigmented macular rash on right chest and shoulder, little response to topical steroid and anti fungal refer to dermatology  Cholelithiasis Ongoing drainage from ostomy , despite daily cautery , wishes surgeon to re eval, will refer  Carotid artery disease ([place]) Follow up with vascular surgeon past due will refer

## 2017-02-28 NOTE — Assessment & Plan Note (Signed)
Controlled with medication and followed y neurology

## 2017-02-28 NOTE — Assessment & Plan Note (Signed)
Hyperlipidemia:Low fat diet discussed and encouraged.   Lipid Panel  Lab Results  Component Value Date   CHOL 172 03/12/2016   HDL 80 03/12/2016   LDLCALC 84 03/12/2016   TRIG 38 03/12/2016   CHOLHDL 2.2 03/12/2016    Updated lab needed at/ before next visit.

## 2017-02-28 NOTE — Assessment & Plan Note (Signed)
Follow up with vascular surgeon past due will refer

## 2017-02-28 NOTE — Assessment & Plan Note (Signed)
Followed by endo, appears better controlled markedly less proptosis

## 2017-02-28 NOTE — Assessment & Plan Note (Signed)
Controlled,managed by endo and doing exceptionally well    Chase [name] is reminded of the importance of commitment to daily physical activity for 30 minutes or more, as able and the need to limit carbohydrate intake to 30 to 60 grams per meal to help with blood sugar control.   The need to take medication as prescribed, test blood sugar as directed, and to call between visits if there is a concern that blood sugar is uncontrolled is also discussed.   Chase [name] is reminded of the importance of daily foot exam, annual eye examination, and good blood sugar, blood pressure and cholesterol control.  Diabetic Labs Latest Ref Rng & Units 02/26/2017 01/29/2017 07/30/2016 07/28/2016 04/13/2016  HbA1c <5.7 % - 5.5 5.7(H) - 6.0(H)  Microalbumin Not Estab. ug/mL 194.2(H) - - - -  Micro/Creat Ratio 0.0 - 30.0 mg/g creat 278.2(H) - - - -  Chol 125 - 200 mg/dL - - - - -  HDL >=40 mg/dL - - - - -  Calc LDL <130 mg/dL - - - - -  Triglycerides <150 mg/dL - - - - -  Creatinine 0.70 - 1.11 mg/dL - 3.13(H) 3.48(H) 3.33(H) 3.23(H)   BP/Weight 02/25/2017 02/04/2017 01/25/2017 09/01/2016 08/05/2016 07/28/2016 0/25/4862  Systolic BP 824 175 301 040 459 136 -  Diastolic BP 74 52 80 74 82 73 -  Wt. (Lbs) 172 - 177.12 182 - - 178  BMI 26.15 - 26.93 27.67 - - 27.06   Foot/eye exam completion dates Latest Ref Rng & Units 02/25/2017 11/12/2015  Eye Exam No Retinopathy - -  Foot exam Order - - -  Foot Form Completion - Done Done

## 2017-02-28 NOTE — Assessment & Plan Note (Signed)
Controlled, no change in medication DASH diet and commitment to daily physical activity for a minimum of 30 minutes discussed and encouraged, as a part of hypertension management. The importance of attaining a healthy weight is also discussed.  BP/Weight 02/25/2017 02/04/2017 01/25/2017 09/01/2016 08/05/2016 07/28/2016 4/31/4276  Systolic BP 701 100 349 611 643 539 -  Diastolic BP 74 52 80 74 82 73 -  Wt. (Lbs) 172 - 177.12 182 - - 178  BMI 26.15 - 26.93 27.67 - - 27.06

## 2017-02-28 NOTE — Assessment & Plan Note (Signed)
Chronic pruritic hyperpigmented macular rash on right chest and shoulder, little response to topical steroid and anti fungal refer to dermatology

## 2017-02-28 NOTE — Assessment & Plan Note (Signed)
Ongoing drainage from ostomy , despite daily cautery , wishes surgeon to re eval, will refer

## 2017-03-02 ENCOUNTER — Other Ambulatory Visit: Payer: Self-pay | Admitting: Family Medicine

## 2017-03-02 NOTE — Telephone Encounter (Signed)
How long should pt be taking this med?

## 2017-03-03 ENCOUNTER — Other Ambulatory Visit: Payer: Self-pay | Admitting: "Endocrinology

## 2017-03-04 DIAGNOSIS — R809 Proteinuria, unspecified: Secondary | ICD-10-CM | POA: Diagnosis not present

## 2017-03-04 DIAGNOSIS — N184 Chronic kidney disease, stage 4 (severe): Secondary | ICD-10-CM | POA: Diagnosis not present

## 2017-03-04 DIAGNOSIS — E559 Vitamin D deficiency, unspecified: Secondary | ICD-10-CM | POA: Diagnosis not present

## 2017-03-04 DIAGNOSIS — Z79899 Other long term (current) drug therapy: Secondary | ICD-10-CM | POA: Diagnosis not present

## 2017-03-04 DIAGNOSIS — D638 Anemia in other chronic diseases classified elsewhere: Secondary | ICD-10-CM | POA: Diagnosis not present

## 2017-03-07 ENCOUNTER — Other Ambulatory Visit: Payer: Self-pay | Admitting: Family Medicine

## 2017-03-16 DIAGNOSIS — H1045 Other chronic allergic conjunctivitis: Secondary | ICD-10-CM | POA: Diagnosis not present

## 2017-03-18 DIAGNOSIS — L258 Unspecified contact dermatitis due to other agents: Secondary | ICD-10-CM | POA: Diagnosis not present

## 2017-04-01 DIAGNOSIS — N184 Chronic kidney disease, stage 4 (severe): Secondary | ICD-10-CM | POA: Diagnosis not present

## 2017-04-01 DIAGNOSIS — D631 Anemia in chronic kidney disease: Secondary | ICD-10-CM | POA: Diagnosis not present

## 2017-04-09 DIAGNOSIS — E1142 Type 2 diabetes mellitus with diabetic polyneuropathy: Secondary | ICD-10-CM | POA: Diagnosis not present

## 2017-04-09 DIAGNOSIS — B351 Tinea unguium: Secondary | ICD-10-CM | POA: Diagnosis not present

## 2017-04-14 ENCOUNTER — Other Ambulatory Visit: Payer: Self-pay | Admitting: Cardiovascular Disease

## 2017-04-28 ENCOUNTER — Encounter: Payer: Self-pay | Admitting: Vascular Surgery

## 2017-05-04 NOTE — Progress Notes (Signed)
Established Carotid Patient   History of Present Illness   Chase [name] is a 83 y.o. (1931/02/14) male who presents with chief complaint: follow up carotid duplex.  Previous carotid studies demonstrated: RICA 8-81% stenosis, LICA 1-03% stenosis.  Patient has previously had L MCA acute infarct.  The patient has never had amaurosis fugax or monocular blindness.  The patient has never had facial drooping or hemiplegia.  The patient has never had receptive or expressive aphasia.    Pt has history of previously healed VSU.  He states he does have some leg swelling.  He denies any pain with walking or pain in his feet.   He sometimes has a productive cough.    The patient's PMH, PSH, SH, and FamHx are unchanged from 11/16/14.   Current Outpatient Prescriptions  Medication Sig Dispense Refill  . amLODipine (NORVASC) 10 MG tablet TAKE ONE TABLET BY MOUTH ONCE DAILY 90 tablet 1  . aspirin EC 81 MG tablet Take 1 tablet (81 mg total) by mouth daily. 150 tablet 2  . blood glucose meter kit and supplies KIT Dispense based on patient and insurance preference. Use up to two times daily as directed. (FOR ICD 10 E11.65) 1 each 0  . chlorthalidone (HYGROTON) 50 MG tablet TAKE ONE TABLET BY MOUTH ONCE DAILY 90 tablet 1  . clopidogrel (PLAVIX) 75 MG tablet TAKE ONE TABLET BY MOUTH ONCE DAILY 30 tablet 1  . folic acid (FOLVITE) 1 MG tablet Take 1 tablet (1 mg total) by mouth daily. 30 tablet 4  . HUMALOG MIX 75/25 KWIKPEN (75-25) 100 UNIT/ML Kwikpen INJECT 10 UNITS SUBCUTANEOUSLY TWICE DAILY 15 mL 2  . hydrALAZINE (APRESOLINE) 50 MG tablet TAKE 1 TABLET BY MOUTH TWICE DAILY 30 tablet 0  . levETIRAcetam (KEPPRA) 500 MG tablet Take 250 mg by mouth at bedtime.     Marland Kitchen levothyroxine (SYNTHROID, LEVOTHROID) 150 MCG tablet Take 1 tablet (150 mcg total) by mouth every morning. 30 tablet 6  . lovastatin (MEVACOR) 40 MG tablet TAKE ONE TABLET BY MOUTH AT BEDTIME 90 tablet 1  . mometasone (ELOCON) 0.1 % cream Apply  1 application topically daily. 45 g 0  . Multiple Vitamins-Minerals (EYE VITAMINS PO) Take 1 tablet by mouth daily.     Marland Kitchen olopatadine (PATANOL) 0.1 % ophthalmic solution Place 1 drop into both eyes 2 (two) times daily. 5 mL 12  . ONE TOUCH ULTRA TEST test strip USE 1 STRIP TO CHECK GLUCOSE UP TO TWICE DAILY 100 each 5  . ONETOUCH DELICA LANCETS 15X MISC     . tamsulosin (FLOMAX) 0.4 MG CAPS capsule Take 0.4 mg by mouth.    . Vitamin D, Ergocalciferol, (DRISDOL) 50000 units CAPS capsule TAKE ONE CAPSULE BY MOUTH ONCE A WEEK 12 capsule 1   No current facility-administered medications for this visit.     Allergies  Allergen Reactions  . Bayer Advanced Aspirin [Aspirin] Nausea And Vomiting  . Penicillins Nausea And Vomiting    On ROS today: see HPI, otherwise 10 point ROS is negative   Physical Examination   Vitals:   05/07/17 1546 05/07/17 1548  BP: 135/69 (!) 143/66  Pulse: 67   Resp: 20   Temp: 97.9 F (36.6 C)   TempSrc: Oral   SpO2: 98%   Weight: 173 lb 9.6 oz (78.7 kg)   Height: '5\' 8"'  (1.727 m)     Body mass index is 26.4 kg/m.  General Alert, O x 3, WD, NAD  Neck Supple,  mid-line trachea,     Pulmonary Sym exp, good B air movt, CTA B  Cardiac RRR, Nl S1, S2, no Murmurs, No rubs, No S3,S4  Vascular Vessel Right Left  Radial Palpable Palpable  Carotid Palpable, No Bruit Palpable, No Bruit  PT Not palpable Not palpable  DP Not palpable Not palpable    Musculoskeletal M/S 5/5 throughout  , Extremities without ischemic changes  , Edema present: BLE  Neurologic Cranial nerves 2-12 intact  , Pain and light touch intact in extremities  , Motor exam as listed above    Non-Invasive Vascular Imaging   Carotid Duplex (05/07/2017 ):   R ICA stenosis:  1-39%  R VA: patent and antegrade  L ICA stenosis:  1-39%  L VA: patent and antegrade   Medical Decision Making   Chase [name] is a 83 y.o. male who presents with: prior L MCA, currently B asx ICA stenosis <80%.,  healing L venous stasis ulcer ([place])   Based on the patient's vascular studies and examination, I have offered the patient: B carotid duplex every 2 years.  His carotid studies have remained stable over the past 2 years.  Given he is asymptomatic and duplex unchanged, he will f/u with the NP in 2 years with duplex.  He will call sooner if he has any signs of stroke and these sx were reviewed with pt and his family.  I discussed in depth with the patient the nature of atherosclerosis, and emphasized the importance of maximal medical management including strict control of blood pressure, blood glucose, and lipid levels, antiplatelet agents, obtaining regular exercise, and cessation of smoking.    The patient is aware that without maximal medical management the underlying atherosclerotic disease process will progress, limiting the benefit of any interventions. The patient is currently on Mevacor  The patient is currently on an anti-platelet: ASA.  Thank you for allowing [place] to participate in this patient's care.   Leontine Locket, [facility] 05/07/2017 4:09 PM  Addendum  I have independently interviewed and examined the patient, and I agree with the physician assistant's findings.  BLE CVI is better today with only 1+ edema B.  B carotid duplex have been stable over the last 2 years.  Will stretch out to q2 year carotid duplex.  Adele Barthel, MD, FACS [facility] Office: [phone] Pager: [phone]

## 2017-05-05 ENCOUNTER — Other Ambulatory Visit: Payer: Self-pay

## 2017-05-05 DIAGNOSIS — I779 Disorder of arteries and arterioles, unspecified: Secondary | ICD-10-CM

## 2017-05-05 DIAGNOSIS — Z8673 Personal history of transient ischemic attack (TIA), and cerebral infarction without residual deficits: Secondary | ICD-10-CM

## 2017-05-05 DIAGNOSIS — I739 Peripheral vascular disease, unspecified: Principal | ICD-10-CM

## 2017-05-06 ENCOUNTER — Encounter: Payer: Self-pay | Admitting: Family Medicine

## 2017-05-06 DIAGNOSIS — Z79899 Other long term (current) drug therapy: Secondary | ICD-10-CM | POA: Diagnosis not present

## 2017-05-06 DIAGNOSIS — H2513 Age-related nuclear cataract, bilateral: Secondary | ICD-10-CM | POA: Diagnosis not present

## 2017-05-06 DIAGNOSIS — E559 Vitamin D deficiency, unspecified: Secondary | ICD-10-CM | POA: Diagnosis not present

## 2017-05-06 DIAGNOSIS — R809 Proteinuria, unspecified: Secondary | ICD-10-CM | POA: Diagnosis not present

## 2017-05-06 DIAGNOSIS — D631 Anemia in chronic kidney disease: Secondary | ICD-10-CM | POA: Diagnosis not present

## 2017-05-06 DIAGNOSIS — N184 Chronic kidney disease, stage 4 (severe): Secondary | ICD-10-CM | POA: Diagnosis not present

## 2017-05-06 LAB — HM DIABETES EYE EXAM

## 2017-05-07 ENCOUNTER — Ambulatory Visit (INDEPENDENT_AMBULATORY_CARE_PROVIDER_SITE_OTHER): Payer: Medicare Other | Admitting: Vascular Surgery

## 2017-05-07 ENCOUNTER — Ambulatory Visit (HOSPITAL_COMMUNITY)
Admission: RE | Admit: 2017-05-07 | Discharge: 2017-05-07 | Disposition: A | Discharge status: Home / Self Care | Payer: Medicare Other | Source: Ambulatory Visit | Attending: Vascular Surgery | Admitting: Vascular Surgery

## 2017-05-07 ENCOUNTER — Encounter: Payer: Self-pay | Admitting: Vascular Surgery

## 2017-05-07 VITALS — BP 143/66 | HR 67 | Temp 97.9°F | Resp 20 | Ht 68.0 in | Wt 173.6 lb

## 2017-05-07 DIAGNOSIS — I6523 Occlusion and stenosis of bilateral carotid arteries: Secondary | ICD-10-CM

## 2017-05-07 DIAGNOSIS — I779 Disorder of arteries and arterioles, unspecified: Secondary | ICD-10-CM

## 2017-05-07 DIAGNOSIS — I872 Venous insufficiency (chronic) (peripheral): Secondary | ICD-10-CM | POA: Diagnosis not present

## 2017-05-07 DIAGNOSIS — I739 Peripheral vascular disease, unspecified: Secondary | ICD-10-CM

## 2017-05-07 DIAGNOSIS — Z8673 Personal history of transient ischemic attack (TIA), and cerebral infarction without residual deficits: Secondary | ICD-10-CM | POA: Diagnosis not present

## 2017-05-07 DIAGNOSIS — I6522 Occlusion and stenosis of left carotid artery: Secondary | ICD-10-CM | POA: Diagnosis not present

## 2017-05-09 ENCOUNTER — Other Ambulatory Visit: Payer: Self-pay | Admitting: Family Medicine

## 2017-05-11 DIAGNOSIS — E872 Acidosis: Secondary | ICD-10-CM | POA: Diagnosis not present

## 2017-05-11 DIAGNOSIS — I509 Heart failure, unspecified: Secondary | ICD-10-CM | POA: Diagnosis not present

## 2017-05-11 DIAGNOSIS — E1129 Type 2 diabetes mellitus with other diabetic kidney complication: Secondary | ICD-10-CM | POA: Diagnosis not present

## 2017-05-11 DIAGNOSIS — I10 Essential (primary) hypertension: Secondary | ICD-10-CM | POA: Diagnosis not present

## 2017-05-11 DIAGNOSIS — R809 Proteinuria, unspecified: Secondary | ICD-10-CM | POA: Diagnosis not present

## 2017-05-11 DIAGNOSIS — E875 Hyperkalemia: Secondary | ICD-10-CM | POA: Diagnosis not present

## 2017-05-11 DIAGNOSIS — N184 Chronic kidney disease, stage 4 (severe): Secondary | ICD-10-CM | POA: Diagnosis not present

## 2017-05-24 ENCOUNTER — Other Ambulatory Visit: Payer: Self-pay | Admitting: Family Medicine

## 2017-05-24 ENCOUNTER — Other Ambulatory Visit: Payer: Self-pay | Admitting: Cardiovascular Disease

## 2017-05-24 DIAGNOSIS — K8010 Calculus of gallbladder with chronic cholecystitis without obstruction: Secondary | ICD-10-CM | POA: Diagnosis not present

## 2017-05-24 DIAGNOSIS — L929 Granulomatous disorder of the skin and subcutaneous tissue, unspecified: Secondary | ICD-10-CM | POA: Diagnosis not present

## 2017-05-27 NOTE — Addendum Note (Signed)
Addended by: Lianne Cure A on: 05/27/2017 09:53 AM   Modules accepted: Orders

## 2017-06-03 DIAGNOSIS — N184 Chronic kidney disease, stage 4 (severe): Secondary | ICD-10-CM | POA: Diagnosis not present

## 2017-06-03 DIAGNOSIS — D631 Anemia in chronic kidney disease: Secondary | ICD-10-CM | POA: Diagnosis not present

## 2017-06-10 ENCOUNTER — Other Ambulatory Visit: Payer: Self-pay | Admitting: Cardiovascular Disease

## 2017-06-10 ENCOUNTER — Other Ambulatory Visit: Payer: Self-pay | Admitting: Family Medicine

## 2017-06-25 DIAGNOSIS — B351 Tinea unguium: Secondary | ICD-10-CM | POA: Diagnosis not present

## 2017-06-25 DIAGNOSIS — E1142 Type 2 diabetes mellitus with diabetic polyneuropathy: Secondary | ICD-10-CM | POA: Diagnosis not present

## 2017-06-28 DIAGNOSIS — I10 Essential (primary) hypertension: Secondary | ICD-10-CM | POA: Diagnosis not present

## 2017-06-28 DIAGNOSIS — M159 Polyosteoarthritis, unspecified: Secondary | ICD-10-CM | POA: Diagnosis not present

## 2017-06-28 DIAGNOSIS — E039 Hypothyroidism, unspecified: Secondary | ICD-10-CM | POA: Diagnosis not present

## 2017-06-28 DIAGNOSIS — F329 Major depressive disorder, single episode, unspecified: Secondary | ICD-10-CM | POA: Diagnosis not present

## 2017-06-28 DIAGNOSIS — E1140 Type 2 diabetes mellitus with diabetic neuropathy, unspecified: Secondary | ICD-10-CM | POA: Diagnosis not present

## 2017-06-28 DIAGNOSIS — G40909 Epilepsy, unspecified, not intractable, without status epilepticus: Secondary | ICD-10-CM | POA: Diagnosis not present

## 2017-06-28 DIAGNOSIS — I6990 Unspecified sequelae of unspecified cerebrovascular disease: Secondary | ICD-10-CM | POA: Diagnosis not present

## 2017-06-28 DIAGNOSIS — R2681 Unsteadiness on feet: Secondary | ICD-10-CM | POA: Diagnosis not present

## 2017-07-01 DIAGNOSIS — D631 Anemia in chronic kidney disease: Secondary | ICD-10-CM | POA: Diagnosis not present

## 2017-07-01 DIAGNOSIS — N184 Chronic kidney disease, stage 4 (severe): Secondary | ICD-10-CM | POA: Diagnosis not present

## 2017-07-13 ENCOUNTER — Other Ambulatory Visit: Payer: Self-pay | Admitting: Family Medicine

## 2017-07-14 NOTE — Telephone Encounter (Signed)
Should pt be on plavix? (its no longer on his med list)

## 2017-07-18 ENCOUNTER — Other Ambulatory Visit: Payer: Self-pay | Admitting: Cardiovascular Disease

## 2017-07-18 ENCOUNTER — Other Ambulatory Visit: Payer: Self-pay | Admitting: Family Medicine

## 2017-07-18 DIAGNOSIS — I10 Essential (primary) hypertension: Secondary | ICD-10-CM

## 2017-08-03 DIAGNOSIS — N184 Chronic kidney disease, stage 4 (severe): Secondary | ICD-10-CM | POA: Diagnosis not present

## 2017-08-03 DIAGNOSIS — E559 Vitamin D deficiency, unspecified: Secondary | ICD-10-CM | POA: Diagnosis not present

## 2017-08-03 DIAGNOSIS — D631 Anemia in chronic kidney disease: Secondary | ICD-10-CM | POA: Diagnosis not present

## 2017-08-03 DIAGNOSIS — Z79899 Other long term (current) drug therapy: Secondary | ICD-10-CM | POA: Diagnosis not present

## 2017-08-03 DIAGNOSIS — R809 Proteinuria, unspecified: Secondary | ICD-10-CM | POA: Diagnosis not present

## 2017-08-03 DIAGNOSIS — D509 Iron deficiency anemia, unspecified: Secondary | ICD-10-CM | POA: Diagnosis not present

## 2017-08-04 ENCOUNTER — Other Ambulatory Visit: Payer: Self-pay | Admitting: "Endocrinology

## 2017-08-04 DIAGNOSIS — E1165 Type 2 diabetes mellitus with hyperglycemia: Secondary | ICD-10-CM | POA: Diagnosis not present

## 2017-08-04 DIAGNOSIS — N184 Chronic kidney disease, stage 4 (severe): Secondary | ICD-10-CM | POA: Diagnosis not present

## 2017-08-04 DIAGNOSIS — E1122 Type 2 diabetes mellitus with diabetic chronic kidney disease: Secondary | ICD-10-CM | POA: Diagnosis not present

## 2017-08-05 LAB — T4, FREE: Free T4: 1.5 ng/dL (ref 0.8–1.8)

## 2017-08-05 LAB — COMPREHENSIVE METABOLIC PANEL
ALT: 15 U/L (ref 9–46)
AST: 18 U/L (ref 10–35)
Albumin: 4 g/dL (ref 3.6–5.1)
Alkaline Phosphatase: 104 U/L (ref 40–115)
BUN: 45 mg/dL — ABNORMAL HIGH (ref 7–25)
CO2: 19 mmol/L — ABNORMAL LOW (ref 20–32)
Calcium: 8.8 mg/dL (ref 8.6–10.3)
Chloride: 107 mmol/L (ref 98–110)
Creat: 3.31 mg/dL — ABNORMAL HIGH (ref 0.70–1.11)
Glucose, Bld: 121 mg/dL — ABNORMAL HIGH (ref 65–99)
Potassium: 4.5 mmol/L (ref 3.5–5.3)
Sodium: 140 mmol/L (ref 135–146)
Total Bilirubin: 0.4 mg/dL (ref 0.2–1.2)
Total Protein: 6.8 g/dL (ref 6.1–8.1)

## 2017-08-05 LAB — TSH: TSH: 0.31 mIU/L — ABNORMAL LOW (ref 0.40–4.50)

## 2017-08-05 LAB — HEMOGLOBIN A1C
Hgb A1c MFr Bld: 6.2 % — ABNORMAL HIGH (ref <5.7)
Mean Plasma Glucose: 131 mg/dL

## 2017-08-11 ENCOUNTER — Encounter: Payer: Self-pay | Admitting: "Endocrinology

## 2017-08-11 ENCOUNTER — Ambulatory Visit (INDEPENDENT_AMBULATORY_CARE_PROVIDER_SITE_OTHER): Payer: Medicare Other | Admitting: "Endocrinology

## 2017-08-11 VITALS — BP 134/73 | HR 75 | Ht 68.0 in

## 2017-08-11 DIAGNOSIS — I10 Essential (primary) hypertension: Secondary | ICD-10-CM

## 2017-08-11 DIAGNOSIS — E038 Other specified hypothyroidism: Secondary | ICD-10-CM | POA: Diagnosis not present

## 2017-08-11 DIAGNOSIS — E1165 Type 2 diabetes mellitus with hyperglycemia: Secondary | ICD-10-CM

## 2017-08-11 DIAGNOSIS — I6523 Occlusion and stenosis of bilateral carotid arteries: Secondary | ICD-10-CM

## 2017-08-11 DIAGNOSIS — E1122 Type 2 diabetes mellitus with diabetic chronic kidney disease: Secondary | ICD-10-CM

## 2017-08-11 DIAGNOSIS — Z794 Long term (current) use of insulin: Secondary | ICD-10-CM | POA: Diagnosis not present

## 2017-08-11 DIAGNOSIS — E782 Mixed hyperlipidemia: Secondary | ICD-10-CM | POA: Diagnosis not present

## 2017-08-11 DIAGNOSIS — IMO0002 Reserved for concepts with insufficient information to code with codable children: Secondary | ICD-10-CM

## 2017-08-11 DIAGNOSIS — N184 Chronic kidney disease, stage 4 (severe): Secondary | ICD-10-CM | POA: Diagnosis not present

## 2017-08-11 NOTE — Progress Notes (Signed)
Subjective:    Patient ID: Chase [name], male    DOB: 1931/02/15, PCP [name], MD   Past Medical History:  Diagnosis Date  . Bradycardia 03/15/2012  . Carotid artery occlusion   . CKD (chronic kidney disease) stage 4, GFR 15-29 ml/min (HCC)   . Complete lesion of cervical spinal cord ([place]) 03/14/3012   Stable since 2006  . CVA (cerebrovascular accident) ([place]) 09/07/12  . Depressive disorder, not elsewhere classified   . Diabetes mellitus approx 1994  . Diabetic neuropathy ([place])   . Hypertensive heart disease   . Hypothyroidism approx 2000  . Lacunar stroke, acute ([place]) 03/14/2012  . Obesity   . Osteoarthrosis, unspecified whether generalized or localized, lower leg   . Other and unspecified hyperlipidemia   . Peripheral vascular disease, unspecified ([place])   . Seizures ([place])   . Spinal stenosis, unspecified region other than cervical   . Spondylosis of unspecified site without mention of myelopathy    Past Surgical History:  Procedure Laterality Date  . COLONOSCOPY N/A 08/10/2013   Procedure: COLONOSCOPY;  Surgeon: Rogene Houston, MD;  Location: [facility];  Service: Endoscopy;  Laterality: N/A;  240  . kidney stones left x2  1975  . KIDNEY SURGERY     Ruptured left kidney 30 yrs ago  from a kidney stone   Social History   Social History  . Marital status: Married    Spouse name: N/A  . Number of children: 5  . Years of education: N/A   Occupational History  . retired  Retired   Social History Main Topics  . Smoking status: Never Smoker  . Smokeless tobacco: Never Used  . Alcohol use No  . Drug use: No  . Sexual activity: No   Other Topics Concern  . None   Social History Narrative  . None   Outpatient Encounter Prescriptions as of 08/11/2017  Medication Sig  . amLODipine (NORVASC) 10 MG tablet TAKE ONE TABLET BY MOUTH ONCE DAILY  . aspirin EC 81 MG tablet Take 1 tablet (81 mg total) by mouth daily.  . blood glucose meter kit and supplies KIT  Dispense based on patient and insurance preference. Use up to two times daily as directed. (FOR ICD 10 E11.65)  . chlorthalidone (HYGROTON) 50 MG tablet TAKE ONE TABLET BY MOUTH ONCE DAILY  . folic acid (FOLVITE) 1 MG tablet Take 1 tablet (1 mg total) by mouth daily.  Marland Kitchen HUMALOG MIX 75/25 KWIKPEN (75-25) 100 UNIT/ML Kwikpen INJECT 10 UNITS SUBCUTANEOUSLY TWICE DAILY  . hydrALAZINE (APRESOLINE) 50 MG tablet TAKE 1 TABLET BY MOUTH TWICE DAILY *PATIENT  NEEDS  OFFICE  VISIT*  . levETIRAcetam (KEPPRA) 500 MG tablet Take 250 mg by mouth at bedtime.   Marland Kitchen levothyroxine (SYNTHROID, LEVOTHROID) 150 MCG tablet Take 1 tablet (150 mcg total) by mouth every morning.  . lovastatin (MEVACOR) 40 MG tablet TAKE 1 TABLET BY MOUTH AT BEDTIME  . mometasone (ELOCON) 0.1 % cream Apply 1 application topically daily.  . Multiple Vitamins-Minerals (EYE VITAMINS PO) Take 1 tablet by mouth daily.   Marland Kitchen olopatadine (PATANOL) 0.1 % ophthalmic solution Place 1 drop into both eyes 2 (two) times daily.  . ONE TOUCH ULTRA TEST test strip USE 1 STRIP TO CHECK GLUCOSE UP TO TWICE DAILY  . ONETOUCH DELICA LANCETS 17R MISC   . tamsulosin (FLOMAX) 0.4 MG CAPS capsule Take 0.4 mg by mouth.  . Vitamin D, Ergocalciferol, (DRISDOL) 50000 units CAPS capsule  TAKE ONE CAPSULE BY MOUTH ONCE A WEEK   No facility-administered encounter medications on file as of 08/11/2017.    ALLERGIES: Allergies  Allergen Reactions  . Bayer Advanced Aspirin [Aspirin] Nausea And Vomiting  . Penicillins Nausea And Vomiting   VACCINATION STATUS: Immunization History  Administered Date(s) Administered  . H1N1 11/14/2008  . Influenza Split 10/07/2011, 09/08/2012  . Influenza Whole 08/22/2007, 08/27/2010  . Influenza,inj,Quad PF,6+ Mos 08/22/2013, 09/26/2014, 09/26/2015, 09/01/2016  . Pneumococcal Conjugate-13 06/12/2014  . Pneumococcal Polysaccharide-23 05/21/2004  . Td 05/21/2004  . Tdap 10/07/2011    Diabetes  He presents for his follow-up diabetic  visit. He has type 2 diabetes mellitus. Onset time: He was diagnosed at approximate age of 44 years. His disease course has been stable. There are no hypoglycemic associated symptoms. Pertinent negatives for hypoglycemia include no confusion, headaches, pallor or seizures. There are no diabetic associated symptoms. Pertinent negatives for diabetes include no chest pain, no fatigue, no polydipsia, no polyphagia, no polyuria and no weakness. There are no hypoglycemic complications. Symptoms are stable. Diabetic complications include nephropathy and PVD. Risk factors for coronary artery disease include diabetes mellitus, dyslipidemia, male sex, obesity and sedentary lifestyle. Current diabetic treatment includes insulin injections. He is compliant with treatment most of the time. His weight is decreasing steadily. He is following a diabetic diet. When asked about meal planning, he reported none. He never participates in exercise. His home blood glucose trend is decreasing steadily. His breakfast blood glucose range is generally 140-180 mg/dl. His dinner blood glucose range is generally 140-180 mg/dl.  Hyperlipidemia  This is a chronic problem. The current episode started more than 1 year ago. Pertinent negatives include no chest pain, myalgias or shortness of breath. Current antihyperlipidemic treatment includes statins.  Hypertension  This is a chronic problem. The current episode started more than 1 year ago. Pertinent negatives include no chest pain, headaches, neck pain, palpitations or shortness of breath. Past treatments include direct vasodilators. Hypertensive end-organ damage includes PVD. Identifiable causes of hypertension include a thyroid problem.  Thyroid Problem  Presents for follow-up visit. Patient reports no constipation, diarrhea, fatigue or palpitations. The symptoms have been stable. Past treatments include levothyroxine. His past medical history is significant for hyperlipidemia.      Review of Systems  Constitutional: Negative for chills, fatigue, fever and unexpected weight change.  HENT: Negative for dental problem, mouth sores and trouble swallowing.   Eyes: Negative for visual disturbance.  Respiratory: Negative for cough, choking, chest tightness, shortness of breath and wheezing.   Cardiovascular: Negative for chest pain, palpitations and leg swelling.  Gastrointestinal: Negative for abdominal distention, abdominal pain, constipation, diarrhea, nausea and vomiting.  Endocrine: Negative for polydipsia, polyphagia and polyuria.  Genitourinary: Negative for dysuria, flank pain, hematuria and urgency.  Musculoskeletal: Positive for gait problem. Negative for back pain, myalgias and neck pain.       Walks with a walker, due to disequilibrium and arthritis.  Skin: Negative for pallor, rash and wound.  Neurological: Negative for seizures, syncope, weakness, numbness and headaches.  Psychiatric/Behavioral: Negative.  Negative for confusion and dysphoric mood.    Objective:    BP 134/73   Pulse 75   Ht '5\' 8"'  (1.727 m)   Wt Readings from Last 3 Encounters:  05/07/17 173 lb 9.6 oz (78.7 kg)  02/25/17 172 lb (78 kg)  01/25/17 177 lb 1.9 oz (80.3 kg)    Physical Exam  Constitutional: He is oriented to person, place, and time. He appears well-developed  and well-nourished. He is cooperative. No distress.  HENT:  Head: Normocephalic and atraumatic.  Eyes: EOM are normal.  Neck: Normal range of motion. Neck supple. No tracheal deviation present. No thyromegaly present.  Cardiovascular: Normal rate, S1 normal, S2 normal and normal heart sounds.  Exam reveals no gallop.   No murmur heard. Pulses:      Dorsalis pedis pulses are 1+ on the right side, and 1+ on the left side.       Posterior tibial pulses are 1+ on the right side, and 1+ on the left side.  Pulmonary/Chest: Breath sounds normal. No respiratory distress. He has no wheezes.  Abdominal: Soft. Bowel  sounds are normal. He exhibits no distension. There is no tenderness. There is no guarding and no CVA tenderness.  Musculoskeletal: He exhibits no edema.       Right shoulder: He exhibits no deformity.       Left ankle: He exhibits no swelling.       Right foot: There is no swelling.       Left foot: There is no swelling.  Neurological: He is alert and oriented to person, place, and time. He has normal strength and normal reflexes. No cranial nerve deficit or sensory deficit. Gait normal.  Skin: Skin is warm and dry. No rash noted. No cyanosis. Nails show no clubbing.  Psychiatric: He has a normal mood and affect. His speech is normal. Cognition and memory are normal.  He has moderate cognitive deficit.    CMP     Component Value Date/Time   NA 140 08/04/2017 1235   K 4.5 08/04/2017 1235   CL 107 08/04/2017 1235   CO2 19 (L) 08/04/2017 1235   GLUCOSE 121 (H) 08/04/2017 1235   BUN 45 (H) 08/04/2017 1235   CREATININE 3.31 (H) 08/04/2017 1235   CALCIUM 8.8 08/04/2017 1235   PROT 6.8 08/04/2017 1235   ALBUMIN 4.0 08/04/2017 1235   AST 18 08/04/2017 1235   ALT 15 08/04/2017 1235   ALKPHOS 104 08/04/2017 1235   BILITOT 0.4 08/04/2017 1235   GFRNONAA 17 (L) 01/29/2017 1349   GFRAA 20 (L) 01/29/2017 1349   Diabetic Labs (most recent): Lab Results  Component Value Date   HGBA1C 6.2 (H) 08/04/2017   HGBA1C 5.5 01/29/2017   HGBA1C 5.7 (H) 07/30/2016    Lipid Panel     Component Value Date/Time   CHOL 172 03/12/2016 1139   TRIG 38 03/12/2016 1139   HDL 80 03/12/2016 1139   CHOLHDL 2.2 03/12/2016 1139   VLDL 8 03/12/2016 1139   LDLCALC 84 03/12/2016 1139     Assessment & Plan:   1.  type 2 diabetes mellitus with stage 4 chronic kidney disease, with long-term current use of insulin (HCC) -his diabetes is  complicated by chronic kidney disease stage IV and patient remains at a high risk for more acute and chronic complications of diabetes which include CAD, CVA, CKD,  retinopathy, and neuropathy. These are all discussed in detail with the patient.  Patient came with controlled glucose profile, and  recent A1c of 6.2 %.  Glucose logs and insulin administration records pertaining to this visit,  to be scanned into patient's records.  Recent labs reviewed.   - I have re-counseled the patient on diet management   by adopting a carbohydrate restricted / protein rich  Diet.  - Suggestion is made for him to avoid simple carbohydrates  from his diet including Cakes, Sweet Desserts, Ice Cream, Soda (diet  and regular), Sweet Tea, Candies, Chips, Cookies, Store Bought Juices, Alcohol in Excess of  1-2 drinks a day, Artificial Sweeteners, and "Sugar-free" Products. This will help patient to have stable blood glucose profile and potentially avoid unintended weight gain.  - Patient is advised to stick to a routine mealtimes to eat 3 meals  a day and avoid unnecessary snacks ( to snack only to correct hypoglycemia).  - I have approached patient with the following individualized plan to manage diabetes and patient agrees.  - I advised him  and his wife was the primary caregiver to continue 75/25  10 units twice a day when pre-meal blood glucose is above 90 mg/dL, associated with monitoring of blood glucose at least 2 times a day- before breakfast and at bedtime.  - Patient is warned not to take insulin without proper monitoring per orders. -Adjustment parameters are given for hypo and hyperglycemia in writing. -Patient is encouraged to call clinic for blood glucose levels less than 70 or above 300 mg /dl. -Patient is not a candidate for metformin,SGLT2 inhibitors due to CKD.  - Patient specific target  for A1c; LDL, HDL, Triglycerides, and  Waist Circumference were discussed in detail.  2) BP/HTN: Controlled. Continue current medications. 3) Lipids/HPL:  continue statins.  4) hypothyroidism:  - His thyroid function tests are consistent with  appropriate replacement with  thyroid hormone.  I will  continue levothyroxine 150 g by mouth every morning.   - We discussed about correct intake of levothyroxine, at fasting, with water, separated by at least 30 minutes from breakfast, and separated by more than 4 hours from calcium, iron, multivitamins, acid reflux medications (PPIs). -Patient is made aware of the fact that thyroid hormone replacement is needed for life, dose to be adjusted by periodic monitoring of thyroid function tests.  5) Chronic Care/Health Maintenance:  -Patient is on Statin medications and encouraged to continue to follow up with Ophthalmology, Podiatrist at least yearly or according to recommendations, and advised to  stay away from smoking. I have recommended yearly flu vaccine and pneumonia vaccination at least every 5 years; he cannot exercise optimally due to deconditioning and disequilibrium, and  sleep for at least 7 hours a day.  - - Time spent with the patient: 25 min, of which >50% was spent in reviewing his sugar logs , discussing his hypo- and hyper-glycemic episodes, reviewing his current and  previous labs and insulin doses and developing a plan to avoid hypo- and hyper-glycemia.    - I advised patient to maintain close follow up with [name], MD for primary care needs.  Patient is asked to bring meter and  blood glucose logs during his next visit.   Follow up plan: -Return in about 6 months (around 02/08/2018) for follow up with pre-visit labs, meter, and logs.  Glade Lloyd, MD Phone: [phone]  Fax: [phone]  This note was partially dictated with voice recognition software. Similar sounding words can be transcribed inadequately or may not  be corrected upon review.  08/11/2017, 11:17 AM

## 2017-08-24 DIAGNOSIS — R809 Proteinuria, unspecified: Secondary | ICD-10-CM | POA: Diagnosis not present

## 2017-08-24 DIAGNOSIS — N184 Chronic kidney disease, stage 4 (severe): Secondary | ICD-10-CM | POA: Diagnosis not present

## 2017-08-24 DIAGNOSIS — I10 Essential (primary) hypertension: Secondary | ICD-10-CM | POA: Diagnosis not present

## 2017-08-24 DIAGNOSIS — E1129 Type 2 diabetes mellitus with other diabetic kidney complication: Secondary | ICD-10-CM | POA: Diagnosis not present

## 2017-09-01 DIAGNOSIS — D631 Anemia in chronic kidney disease: Secondary | ICD-10-CM | POA: Diagnosis not present

## 2017-09-01 DIAGNOSIS — N184 Chronic kidney disease, stage 4 (severe): Secondary | ICD-10-CM | POA: Diagnosis not present

## 2017-09-06 ENCOUNTER — Ambulatory Visit: Payer: Medicare Other | Admitting: Family Medicine

## 2017-09-08 ENCOUNTER — Ambulatory Visit: Payer: Medicare Other | Admitting: Family Medicine

## 2017-09-08 ENCOUNTER — Ambulatory Visit: Payer: Medicare Other

## 2017-09-08 ENCOUNTER — Encounter: Payer: Self-pay | Admitting: Family Medicine

## 2017-09-08 ENCOUNTER — Ambulatory Visit (INDEPENDENT_AMBULATORY_CARE_PROVIDER_SITE_OTHER): Payer: Medicare Other | Admitting: Family Medicine

## 2017-09-08 VITALS — BP 128/74 | HR 74 | Resp 18 | Ht 68.0 in | Wt 170.0 lb

## 2017-09-08 DIAGNOSIS — L309 Dermatitis, unspecified: Secondary | ICD-10-CM | POA: Diagnosis not present

## 2017-09-08 DIAGNOSIS — Z23 Encounter for immunization: Secondary | ICD-10-CM | POA: Diagnosis not present

## 2017-09-08 DIAGNOSIS — R262 Difficulty in walking, not elsewhere classified: Secondary | ICD-10-CM | POA: Diagnosis not present

## 2017-09-08 DIAGNOSIS — I150 Renovascular hypertension: Secondary | ICD-10-CM | POA: Diagnosis not present

## 2017-09-08 DIAGNOSIS — E782 Mixed hyperlipidemia: Secondary | ICD-10-CM

## 2017-09-08 DIAGNOSIS — E785 Hyperlipidemia, unspecified: Secondary | ICD-10-CM | POA: Diagnosis not present

## 2017-09-08 DIAGNOSIS — I6523 Occlusion and stenosis of bilateral carotid arteries: Secondary | ICD-10-CM

## 2017-09-08 DIAGNOSIS — G40209 Localization-related (focal) (partial) symptomatic epilepsy and epileptic syndromes with complex partial seizures, not intractable, without status epilepticus: Secondary | ICD-10-CM | POA: Diagnosis not present

## 2017-09-08 DIAGNOSIS — E1021 Type 1 diabetes mellitus with diabetic nephropathy: Secondary | ICD-10-CM | POA: Diagnosis not present

## 2017-09-08 LAB — LIPID PANEL
Cholesterol: 159 mg/dL (ref <200)
HDL: 68 mg/dL (ref >40)
LDL Cholesterol (Calc): 71 mg/dL (calc)
Non-HDL Cholesterol (Calc): 91 mg/dL (calc) (ref <130)
Total CHOL/HDL Ratio: 2.3 (calc) (ref <5.0)
Triglycerides: 118 mg/dL (ref <150)

## 2017-09-08 MED ORDER — LORATADINE 10 MG PO TABS: ORAL_TABLET | ORAL | 2 refills | Status: DC

## 2017-09-08 NOTE — Patient Instructions (Addendum)
Wellness on 10/31 as before  MD follow up in 5 month  Rectal today,return the stool cards   Flu vaccine today  Lipid  today  Please return the stool cards for testing for hidden blood, asunable to do rectal exam  New for allergy is once daily loratadine , use if needed  For rash , OTC vaseline , and if starts scratching use sparingly OTC hydrocortisone cream twice daily  For 3 to 5 days. If crusts from excess scratching then will need to use triple antibiotic ointment or neosporin so no infection  MOST important ty to prevent him from scratching, keep hands away   Careful not to fall!  Thank you  for choosing [facility]. We consider it a privelige to serve you.  Delivering excellent health care in a caring and  compassionate way is our goal.  Partnering with you,  so that together we can achieve this goal is our strategy.

## 2017-09-09 ENCOUNTER — Encounter: Payer: Self-pay | Admitting: Family Medicine

## 2017-09-10 DIAGNOSIS — E1142 Type 2 diabetes mellitus with diabetic polyneuropathy: Secondary | ICD-10-CM | POA: Diagnosis not present

## 2017-09-10 DIAGNOSIS — B351 Tinea unguium: Secondary | ICD-10-CM | POA: Diagnosis not present

## 2017-09-14 ENCOUNTER — Other Ambulatory Visit: Payer: Self-pay

## 2017-09-14 ENCOUNTER — Telehealth: Payer: Self-pay | Admitting: "Endocrinology

## 2017-09-14 MED ORDER — GLUCOSE BLOOD VI STRP: ORAL_STRIP | 5 refills | Status: DC

## 2017-09-14 NOTE — Telephone Encounter (Signed)
Rx sent 

## 2017-09-14 NOTE — Telephone Encounter (Signed)
[name] is calling asking if Dr. Dorris Fetch would give extra refills on the test strips at one time for Chase [name] because 1 refill at a time is not lasting him long, please advise?

## 2017-09-19 NOTE — Assessment & Plan Note (Signed)
Controlled, no change in medication Managed by endocrinoplogy Chase [name] is reminded of the importance of commitment to daily physical activity for 30 minutes or more, as able and the need to limit carbohydrate intake to 30 to 60 grams per meal to help with blood sugar control.   The need to take medication as prescribed, test blood sugar as directed, and to call between visits if there is a concern that blood sugar is uncontrolled is also discussed.   Chase [name] is reminded of the importance of daily foot exam, annual eye examination, and good blood sugar, blood pressure and cholesterol control.  Diabetic Labs Latest Ref Rng & Units 09/08/2017 08/04/2017 02/26/2017 01/29/2017 07/30/2016  HbA1c <5.7 % - 6.2(H) - 5.5 5.7(H)  Microalbumin Not Estab. ug/mL - - 194.2(H) - -  Micro/Creat Ratio 0.0 - 30.0 mg/g creat - - 278.2(H) - -  Chol <200 mg/dL 159 - - - -  HDL >40 mg/dL 68 - - - -  Calc LDL <130 mg/dL - - - - -  Triglycerides <150 mg/dL 118 - - - -  Creatinine 0.70 - 1.11 mg/dL - 3.31(H) - 3.13(H) 3.48(H)   BP/Weight 09/08/2017 08/11/2017 05/07/2017 02/25/2017 02/04/2017 01/25/2017 02/25/253  Systolic BP 270 623 762 831 517 616 073  Diastolic BP 74 73 66 74 52 80 74  Wt. (Lbs) 170 - 173.6 172 - 177.12 182  BMI 25.85 - 26.4 26.15 - 26.93 27.67   Foot/eye exam completion dates Latest Ref Rng & Units 02/25/2017 11/12/2015  Eye Exam No Retinopathy - -  Foot exam Order - - -  Foot Form Completion - Done Done

## 2017-09-19 NOTE — Assessment & Plan Note (Signed)
Home safety reviewed and fall precautions

## 2017-09-19 NOTE — Assessment & Plan Note (Signed)
Controlled, no change in medication DASH diet and commitment to daily physical activity for a minimum of 30 minutes discussed and encouraged, as a part of hypertension management. The importance of attaining a healthy weight is also discussed.  BP/Weight 09/08/2017 08/11/2017 05/07/2017 02/25/2017 02/04/2017 01/25/2017 3/96/8864  Systolic BP 847 207 218 288 337 445 146  Diastolic BP 74 73 66 74 52 80 74  Wt. (Lbs) 170 - 173.6 172 - 177.12 182  BMI 25.85 - 26.4 26.15 - 26.93 27.67

## 2017-09-19 NOTE — Assessment & Plan Note (Addendum)
No acute flare , topical vaseline and hydrocortisone to reduce itch OTC antibiotic when skin breakdown occurs to prevent infection

## 2017-09-19 NOTE — Progress Notes (Signed)
JOSPEH MANGEL     MRN: [id number]      DOB: May 11, 1931   HPI Mr. Swamy is here for follow up and re-evaluation of chronic medical conditions, medication management and review of any available recent lab and radiology data.  Preventive health is updated, specifically  Cancer screening, stool cards too be reurned, unsteady on feet, and Immunization.   Questions or concerns regarding consultations or procedures which the PT has had in the interim are  addressed. The PT denies any adverse reactions to current medications since the last visit.  C/o pruritic rash on right anterior chest, chronic   ROS Denies recent fever or chills. Denies sinus pressure, nasal congestion, ear pain or sore throat. Denies chest congestion, productive cough or wheezing. Denies chest pains, palpitations and leg swelling Denies abdominal pain, nausea, vomiting,diarrhea or constipation.   Denies dysuria, frequency, hesitancy or incontinence. Denies joint pain, swelling and limitation in mobility. Denies headaches, seizures, numbness, or tingling. Denies depression, anxiety or insomnia.    PE  BP 128/74   Pulse 74   Resp 18   Ht 5\' 8"  (1.727 m)   Wt 170 lb (77.1 kg)   SpO2 97%   BMI 25.85 kg/m   Patient alert and oriented and in no cardiopulmonary distress.  HEENT: No facial asymmetry, EOMI,   oropharynx pink and moist.  Neck supple no JVD, no mass.  Chest: Clear to auscultation bilaterally.  CVS: S1, S2 no murmurs, no S3.Regular rate.  ABD: Soft non tender.   Ext: No edema  QI:ONGEXBMWU ROM spine, shoulders, hips and knees.  Skin: Intact, hyperpigmented macular rash on anterior right chest, skin intact  Psych: Good eye contact, normal affect. Memory intact not anxious or depressed appearing.  CNS: CN 2-12 intact, decreased  power,  noted.   Assessment & Plan  Renovascular hypertension Controlled, no change in medication DASH diet and commitment to daily physical activity for a minimum  of 30 minutes discussed and encouraged, as a part of hypertension management. The importance of attaining a healthy weight is also discussed.  BP/Weight 09/08/2017 08/11/2017 05/07/2017 02/25/2017 02/04/2017 01/25/2017 1/32/4401  Systolic BP 027 253 664 403 474 259 563  Diastolic BP 74 73 66 74 52 80 74  Wt. (Lbs) 170 - 173.6 172 - 177.12 182  BMI 25.85 - 26.4 26.15 - 26.93 27.67       Difficulty walking Home safety reviewed and fall precautions  Mixed hyperlipidemia Controlled, no change in medication Hyperlipidemia:Low fat diet discussed and encouraged.   Lipid Panel  Lab Results  Component Value Date   CHOL 159 09/08/2017   HDL 68 09/08/2017   LDLCALC 84 03/12/2016   TRIG 118 09/08/2017   CHOLHDL 2.3 09/08/2017       Dermatitis No acute flare , topical vaseline and hydrocortisone to reduce itch OTC antibiotic when skin breakdown occurs to prevent infection  Seizure disorder, complex partial ([place]) Controlled managed by neurology  Type 1 diabetes mellitus with diabetic nephropathy (HCC) Controlled, no change in medication Managed by endocrinoplogy Mr. Shampine is reminded of the importance of commitment to daily physical activity for 30 minutes or more, as able and the need to limit carbohydrate intake to 30 to 60 grams per meal to help with blood sugar control.   The need to take medication as prescribed, test blood sugar as directed, and to call between visits if there is a concern that blood sugar is uncontrolled is also discussed.   Mr. Furukawa is reminded  of the importance of daily foot exam, annual eye examination, and good blood sugar, blood pressure and cholesterol control.  Diabetic Labs Latest Ref Rng & Units 09/08/2017 08/04/2017 02/26/2017 01/29/2017 07/30/2016  HbA1c <5.7 % - 6.2(H) - 5.5 5.7(H)  Microalbumin Not Estab. ug/mL - - 194.2(H) - -  Micro/Creat Ratio 0.0 - 30.0 mg/g creat - - 278.2(H) - -  Chol <200 mg/dL 159 - - - -  HDL >40 mg/dL 68 - - - -  Calc LDL  <130 mg/dL - - - - -  Triglycerides <150 mg/dL 118 - - - -  Creatinine 0.70 - 1.11 mg/dL - 3.31(H) - 3.13(H) 3.48(H)   BP/Weight 09/08/2017 08/11/2017 05/07/2017 02/25/2017 02/04/2017 01/25/2017 9/84/2103  Systolic BP 128 118 867 737 366 815 947  Diastolic BP 74 73 66 74 52 80 74  Wt. (Lbs) 170 - 173.6 172 - 177.12 182  BMI 25.85 - 26.4 26.15 - 26.93 27.67   Foot/eye exam completion dates Latest Ref Rng & Units 02/25/2017 11/12/2015  Eye Exam No Retinopathy - -  Foot exam Order - - -  Foot Form Completion - Done Done

## 2017-09-19 NOTE — Assessment & Plan Note (Signed)
Controlled, no change in medication Hyperlipidemia:Low fat diet discussed and encouraged.   Lipid Panel  Lab Results  Component Value Date   CHOL 159 09/08/2017   HDL 68 09/08/2017   LDLCALC 84 03/12/2016   TRIG 118 09/08/2017   CHOLHDL 2.3 09/08/2017

## 2017-09-19 NOTE — Assessment & Plan Note (Signed)
Controlled managed by neurology

## 2017-09-20 DIAGNOSIS — Z23 Encounter for immunization: Secondary | ICD-10-CM | POA: Diagnosis not present

## 2017-09-22 ENCOUNTER — Telehealth: Payer: Self-pay | Admitting: Family Medicine

## 2017-09-22 DIAGNOSIS — Z1211 Encounter for screening for malignant neoplasm of colon: Secondary | ICD-10-CM

## 2017-09-22 NOTE — Telephone Encounter (Signed)
Please call his wife [name], let her know that although she mailed stool  cards here , you have not received them, pls have her collect another 3 and bring them back personally if unable to find / locate in th next 1 week, thanks

## 2017-09-22 NOTE — Telephone Encounter (Signed)
Called patient regarding message below. No answer, unable to leave message.  

## 2017-09-23 ENCOUNTER — Other Ambulatory Visit: Payer: Self-pay

## 2017-09-23 DIAGNOSIS — Z1211 Encounter for screening for malignant neoplasm of colon: Secondary | ICD-10-CM

## 2017-09-23 LAB — POC HEMOCCULT BLD/STL (HOME/3-CARD/SCREEN)
Card #2 Fecal Occult Blod, POC: NEGATIVE
Card #3 Fecal Occult Blood, POC: NEGATIVE
Fecal Occult Blood, POC: NEGATIVE

## 2017-09-23 NOTE — Addendum Note (Signed)
Addended by: Merceda Elks on: 09/23/2017 01:19 PM   Modules accepted: Orders

## 2017-09-23 NOTE — Telephone Encounter (Signed)
Received cards- tested negative.

## 2017-09-29 DIAGNOSIS — D631 Anemia in chronic kidney disease: Secondary | ICD-10-CM | POA: Diagnosis not present

## 2017-09-29 DIAGNOSIS — N184 Chronic kidney disease, stage 4 (severe): Secondary | ICD-10-CM | POA: Diagnosis not present

## 2017-10-01 ENCOUNTER — Other Ambulatory Visit: Payer: Self-pay | Admitting: Family Medicine

## 2017-10-06 ENCOUNTER — Telehealth: Payer: Self-pay

## 2017-10-06 ENCOUNTER — Ambulatory Visit (INDEPENDENT_AMBULATORY_CARE_PROVIDER_SITE_OTHER): Payer: Medicare Other

## 2017-10-06 VITALS — BP 136/70 | HR 82 | Temp 98.2°F | Ht 68.0 in | Wt 175.1 lb

## 2017-10-06 DIAGNOSIS — Z7189 Other specified counseling: Secondary | ICD-10-CM | POA: Diagnosis not present

## 2017-10-06 DIAGNOSIS — Z0000 Encounter for general adult medical examination without abnormal findings: Secondary | ICD-10-CM

## 2017-10-06 NOTE — Telephone Encounter (Signed)
During patient's AWV today I completed a MMSE and he scored 22/30. His last one that was completed was in 2014 and was 26/30. Patient's wife was concerned

## 2017-10-06 NOTE — Patient Instructions (Addendum)
Chase [name] , Thank you for taking time to come for your Medicare Wellness Visit. I appreciate your ongoing commitment to your health goals. Please review the following plan we discussed and let me know if I can assist you in the future.   Screening recommendations/referrals: Colonoscopy: Up to date and no longer required Diabetic Eye Exam: Overdue, please have Dr. Radford Pax fax [place] your last eye visit Recommended yearly dental visit for hygiene and checkup  Vaccinations: Influenza vaccine: Up to date Pneumococcal vaccine: Up to date Tdap vaccine: Up to date, next due 09/2021 Shingles vaccine: Due, declined    Advanced directives: Advance directive discussed with you today. I have provided a copy for you to complete at home and have notarized. Once this is complete please bring a copy in to our office so we can scan it into your chart.  Conditions/risks identified: Pre-obese, recommend starting a routine exercise program at least 3 days a week for 30-45 minutes at a time as tolerated.   Next appointment: Follow up with Dr. Moshe Cipro on 02/09/2017 at 2:20 pm. Follow up in 1 year for your annual wellness visit.   Preventive Care 61 Years and Older, Male Preventive care refers to lifestyle choices and visits with your health care provider that can promote health and wellness. What does preventive care include?  A yearly physical exam. This is also called an annual well check.  Dental exams once or twice a year.  Routine eye exams. Ask your health care provider how often you should have your eyes checked.  Personal lifestyle choices, including:  Daily care of your teeth and gums.  Regular physical activity.  Eating a healthy diet.  Avoiding tobacco and drug use.  Limiting alcohol use.  Practicing safe sex.  Taking low doses of aspirin every day.  Taking vitamin and mineral supplements as recommended by your health care provider. What happens during an annual well check? The services  and screenings done by your health care provider during your annual well check will depend on your age, overall health, lifestyle risk factors, and family history of disease. Counseling  Your health care provider may ask you questions about your:  Alcohol use.  Tobacco use.  Drug use.  Emotional well-being.  Home and relationship well-being.  Sexual activity.  Eating habits.  History of falls.  Memory and ability to understand (cognition).  Work and work Statistician. Screening  You may have the following tests or measurements:  Height, weight, and BMI.  Blood pressure.  Lipid and cholesterol levels. These may be checked every 5 years, or more frequently if you are over 30 years old.  Skin check.  Lung cancer screening. You may have this screening every year starting at age 80 if you have a 30-pack-year history of smoking and currently smoke or have quit within the past 15 years.  Fecal occult blood test (FOBT) of the stool. You may have this test every year starting at age 17.  Flexible sigmoidoscopy or colonoscopy. You may have a sigmoidoscopy every 5 years or a colonoscopy every 10 years starting at age 19.  Prostate cancer screening. Recommendations will vary depending on your family history and other risks.  Hepatitis C blood test.  Hepatitis B blood test.  Sexually transmitted disease (STD) testing.  Diabetes screening. This is done by checking your blood sugar (glucose) after you have not eaten for a while (fasting). You may have this done every 1-3 years.  Abdominal aortic aneurysm (AAA) screening. You may need  this if you are a current or former smoker.  Osteoporosis. You may be screened starting at age 68 if you are at high risk. Talk with your health care provider about your test results, treatment options, and if necessary, the need for more tests. Vaccines  Your health care provider may recommend certain vaccines, such as:  Influenza vaccine. This  is recommended every year.  Tetanus, diphtheria, and acellular pertussis (Tdap, Td) vaccine. You may need a Td booster every 10 years.  Zoster vaccine. You may need this after age 80.  Pneumococcal 13-valent conjugate (PCV13) vaccine. One dose is recommended after age 51.  Pneumococcal polysaccharide (PPSV23) vaccine. One dose is recommended after age 70. Talk to your health care provider about which screenings and vaccines you need and how often you need them. This information is not intended to replace advice given to you by your health care provider. Make sure you discuss any questions you have with your health care provider. Document Released: 12/20/2015 Document Revised: 08/12/2016 Document Reviewed: 09/24/2015 Elsevier Interactive Patient Education  2017 [place] Prevention in the Home Falls can cause injuries. They can happen to people of all ages. There are many things you can do to make your home safe and to help prevent falls. What can I do on the outside of my home?  Regularly fix the edges of walkways and driveways and fix any cracks.  Remove anything that might make you trip as you walk through a door, such as a raised step or threshold.  Trim any bushes or trees on the path to your home.  Use bright outdoor lighting.  Clear any walking paths of anything that might make someone trip, such as rocks or tools.  Regularly check to see if handrails are loose or broken. Make sure that both sides of any steps have handrails.  Any raised decks and porches should have guardrails on the edges.  Have any leaves, snow, or ice cleared regularly.  Use sand or salt on walking paths during winter.  Clean up any spills in your garage right away. This includes oil or grease spills. What can I do in the bathroom?  Use night lights.  Install grab bars by the toilet and in the tub and shower. Do not use towel bars as grab bars.  Use non-skid mats or decals in the tub or  shower.  If you need to sit down in the shower, use a plastic, non-slip stool.  Keep the floor dry. Clean up any water that spills on the floor as soon as it happens.  Remove soap buildup in the tub or shower regularly.  Attach bath mats securely with double-sided non-slip rug tape.  Do not have throw rugs and other things on the floor that can make you trip. What can I do in the bedroom?  Use night lights.  Make sure that you have a light by your bed that is easy to reach.  Do not use any sheets or blankets that are too big for your bed. They should not hang down onto the floor.  Have a firm chair that has side arms. You can use this for support while you get dressed.  Do not have throw rugs and other things on the floor that can make you trip. What can I do in the kitchen?  Clean up any spills right away.  Avoid walking on wet floors.  Keep items that you use a lot in easy-to-reach places.  If you  need to reach something above you, use a strong step stool that has a grab bar.  Keep electrical cords out of the way.  Do not use floor polish or wax that makes floors slippery. If you must use wax, use non-skid floor wax.  Do not have throw rugs and other things on the floor that can make you trip. What can I do with my stairs?  Do not leave any items on the stairs.  Make sure that there are handrails on both sides of the stairs and use them. Fix handrails that are broken or loose. Make sure that handrails are as long as the stairways.  Check any carpeting to make sure that it is firmly attached to the stairs. Fix any carpet that is loose or worn.  Avoid having throw rugs at the top or bottom of the stairs. If you do have throw rugs, attach them to the floor with carpet tape.  Make sure that you have a light switch at the top of the stairs and the bottom of the stairs. If you do not have them, ask someone to add them for you. What else can I do to help prevent  falls?  Wear shoes that:  Do not have high heels.  Have rubber bottoms.  Are comfortable and fit you well.  Are closed at the toe. Do not wear sandals.  If you use a stepladder:  Make sure that it is fully opened. Do not climb a closed stepladder.  Make sure that both sides of the stepladder are locked into place.  Ask someone to hold it for you, if possible.  Clearly mark and make sure that you can see:  Any grab bars or handrails.  First and last steps.  Where the edge of each step is.  Use tools that help you move around (mobility aids) if they are needed. These include:  Canes.  Walkers.  Scooters.  Crutches.  Turn on the lights when you go into a dark area. Replace any light bulbs as soon as they burn out.  Set up your furniture so you have a clear path. Avoid moving your furniture around.  If any of your floors are uneven, fix them.  If there are any pets around you, be aware of where they are.  Review your medicines with your doctor. Some medicines can make you feel dizzy. This can increase your chance of falling. Ask your doctor what other things that you can do to help prevent falls. This information is not intended to replace advice given to you by your health care provider. Make sure you discuss any questions you have with your health care provider. Document Released: 09/19/2009 Document Revised: 04/30/2016 Document Reviewed: 12/28/2014 Elsevier Interactive Patient Education  2017 [employer].

## 2017-10-06 NOTE — Progress Notes (Signed)
Subjective:   Chase [name] is a 83 y.o. male who presents for Medicare Annual/Subsequent preventive examination.  Review of Systems:  Cardiac Risk Factors include: advanced age (>52mn, >>78women);diabetes mellitus;hypertension;male gender;dyslipidemia;sedentary lifestyle     Objective:    Vitals: BP 136/70   Pulse 82   Temp 98.2 F (36.8 C) (Oral)   Ht _0  (1.727 m)   Wt 175 lb 1.3 oz (79.4 kg)   BMI 26.62 kg/m   Body mass index is 26.62 kg/m.  Tobacco History  Smoking Status  . Never Smoker  Smokeless Tobacco  . Never Used     Counseling given: Not Answered   Past Medical History:  Diagnosis Date  . Bradycardia 03/15/2012  . Carotid artery occlusion   . CKD (chronic kidney disease) stage 4, GFR 15-29 ml/min (HCC)   . Complete lesion of cervical spinal cord ([facility] 03/14/3012   Stable since 2006  . CVA (cerebrovascular accident) ([facility] 09/07/12  . Depressive disorder, not elsewhere classified   . Diabetes mellitus approx 1994  . Diabetic neuropathy ([facility]   . Hypertensive heart disease   . Hypothyroidism approx 2000  . Lacunar stroke, acute ([facility] 03/14/2012  . Obesity   . Osteoarthrosis, unspecified whether generalized or localized, lower leg   . Other and unspecified hyperlipidemia   . Peripheral vascular disease, unspecified ([facility]   . Seizures ([facility]   . Spinal stenosis, unspecified region other than cervical   . Spondylosis of unspecified site without mention of myelopathy    Past Surgical History:  Procedure Laterality Date  . COLONOSCOPY N/A 08/10/2013   Procedure: COLONOSCOPY;  Surgeon: NRogene Houston MD;  Location: [facility];  Service: Endoscopy;  Laterality: N/A;  240  . kidney stones left x2  1975  . KIDNEY SURGERY     Ruptured left kidney 30 yrs ago  from a kidney stone   Family History  Problem Relation Age of Onset  . Diabetes Mother   . Prostate cancer Father   . Hypertension Brother   . Hypertension Brother   . Diabetes Brother   .  Stroke Brother   . Diabetes Brother   . Diabetes Daughter   . Diabetes Daughter    History  Sexual Activity  . Sexual activity: No    Outpatient Encounter Prescriptions as of 10/06/2017  Medication Sig  . amLODipine (NORVASC) 10 MG tablet TAKE ONE TABLET BY MOUTH ONCE DAILY  . aspirin EC 81 MG tablet Take 1 tablet (81 mg total) by mouth daily.  . blood glucose meter kit and supplies KIT Dispense based on patient and insurance preference. Use up to two times daily as directed. (FOR ICD 10 E11.65)  . chlorthalidone (HYGROTON) 50 MG tablet TAKE ONE TABLET BY MOUTH ONCE DAILY  . folic acid (FOLVITE) 1 MG tablet Take 1 tablet (1 mg total) by mouth daily.  .Marland Kitchenglucose blood (ONE TOUCH ULTRA TEST) test strip USE 1 STRIP TO CHECK GLUCOSE UP TO TWICE DAILY E11.65  . HUMALOG MIX 75/25 KWIKPEN (75-25) 100 UNIT/ML Kwikpen INJECT 10 UNITS SUBCUTANEOUSLY TWICE DAILY (Patient taking differently: INJECT 10 UNITS SUBCUTANEOUSLY TWICE DAILY AS NEEDED)  . hydrALAZINE (APRESOLINE) 50 MG tablet TAKE 1 TABLET BY MOUTH TWICE DAILY *PATIENT  NEEDS  OFFICE  VISIT*  . levETIRAcetam (KEPPRA) 500 MG tablet Take 250 mg by mouth at bedtime.   .Marland Kitchenlevothyroxine (SYNTHROID, LEVOTHROID) 150 MCG tablet Take 1 tablet (150 mcg total) by mouth every morning.  . loratadine (CLARITIN) 10  MG tablet One tablet once daily, as needed, for uncontrolled allergy symptoms  . lovastatin (MEVACOR) 40 MG tablet TAKE 1 TABLET BY MOUTH AT BEDTIME  . mometasone (ELOCON) 0.1 % cream Apply 1 application topically daily.  . Multiple Vitamins-Minerals (EYE VITAMINS PO) Take 1 tablet by mouth daily.   Marland Kitchen olopatadine (PATANOL) 0.1 % ophthalmic solution Place 1 drop into both eyes 2 (two) times daily.  Glory Rosebush DELICA LANCETS 99M MISC   . tamsulosin (FLOMAX) 0.4 MG CAPS capsule Take 0.4 mg by mouth.  . Vitamin D, Ergocalciferol, (DRISDOL) 50000 units CAPS capsule TAKE 1 CAPSULE BY MOUTH ONCE A WEEK   No facility-administered encounter medications  on file as of 10/06/2017.     Activities of Daily Living In your present state of health, do you have any difficulty performing the following activities: 10/06/2017 01/25/2017  Hearing? N N  Vision? Y N  Comment Recommend follow up with Dr. Radford Pax -  Difficulty concentrating or making decisions? N N  Walking or climbing stairs? Y Y  Dressing or bathing? Y Y  Doing errands, shopping? Y Y  Comment patient no longer drives -  Conservation officer, nature and eating ? Y -  Using the Toilet? N -  In the past six months, have you accidently leaked urine? Y -  Do you have problems with loss of bowel control? Y -  Managing your Medications? Y -  Managing your Finances? Y -  Housekeeping or managing your Housekeeping? Y -  Some recent data might be hidden    Patient Care Team: [name], MD as PCP - General Herminio Commons, MD as Attending Physician (Cardiology) Fran Lowes, MD as Consulting Physician (Nephrology) Phillips Odor, MD as Consulting Physician (Neurology) Cassandria Anger, MD as Consulting Physician (Endocrinology) Georganna Skeans, MD as Consulting Physician (General Surgery)   Assessment:    Exercise Activities and Dietary recommendations Current Exercise Habits: The patient does not participate in regular exercise at present, Exercise limited by: neurologic condition(s)  Goals    . Exercise 3x per week (30 min per time)          Recommend starting a routine exercise program at least 3 days a week for 30-45 minutes at a time as tolerated.       Fall Risk Fall Risk  10/06/2017 02/04/2017 01/25/2017 04/16/2016 12/13/2015  Falls in the past year? _0   Risk for fall due to : Impaired balance/gait;Impaired mobility - - - -   Depression Screen PHQ 2/9 Scores 10/06/2017 02/04/2017 01/25/2017 04/16/2016  PHQ - 2 Score 0 0 0 0  PHQ- 9 Score - - - -    Cognitive Function MMSE - Mini Mental State Exam 10/06/2017  Orientation to time 1  Orientation to  Place 5  Registration 3  Attention/ Calculation 4  Recall 1  Language- name 2 objects 2  Language- repeat 0  Language- follow 3 step command 3  Language- read & follow direction 1  Write a sentence 1  Copy design 1  Total score 22        Immunization History  Administered Date(s) Administered  . H1N1 11/14/2008  . Influenza Split 10/07/2011, 09/08/2012  . Influenza Whole 08/22/2007, 08/27/2010  . Influenza,inj,Quad PF,6+ Mos 08/22/2013, 09/26/2014, 09/26/2015, 09/01/2016, 09/20/2017  . Pneumococcal Conjugate-13 06/12/2014  . Pneumococcal Polysaccharide-23 05/21/2004  . Td 05/21/2004  . Tdap 10/07/2011   Screening Tests Health Maintenance  Topic Date Due  . OPHTHALMOLOGY EXAM  03/27/2015  .  HEMOGLOBIN A1C  02/03/2018  . URINE MICROALBUMIN  02/26/2018  . FOOT EXAM  02/28/2018  . TETANUS/TDAP  10/06/2021  . INFLUENZA VACCINE  Completed  . PNA vac Low Risk Adult  Completed      Plan:   I have personally reviewed and noted the following in the patient's chart:   . Medical and social history . Use of alcohol, tobacco or illicit drugs  . Current medications and supplements . Functional ability and status . Nutritional status . Physical activity . Advanced directives . List of other physicians . Hospitalizations, surgeries, and ER visits in previous 12 months . Vitals . Screenings to include cognitive, depression, and falls . Referrals and appointments  In addition, I have reviewed and discussed with patient certain preventive protocols, quality metrics, and best practice recommendations. A written personalized care plan for preventive services as well as general preventive health recommendations were provided to patient.     Stormy Fabian, LPN  [phone]

## 2017-10-22 NOTE — Telephone Encounter (Signed)
Pls call wife and let her know I am aware of memory loss concern , I recommend starting low dose of medication aricept for this, and if she agrees pls send back a message , it will be started by next week, an he will have memory assessment  At is March follow up visit after taking the medication for 3 m.5 months

## 2017-10-22 NOTE — Telephone Encounter (Signed)
Wife said his memory is about as good as hers and she doesn't think he needs med right now

## 2017-10-23 NOTE — Telephone Encounter (Signed)
Noted will hold on medication

## 2017-11-05 ENCOUNTER — Other Ambulatory Visit: Payer: Self-pay | Admitting: Family Medicine

## 2017-11-17 ENCOUNTER — Other Ambulatory Visit: Payer: Self-pay | Admitting: "Endocrinology

## 2017-11-18 DIAGNOSIS — Z79899 Other long term (current) drug therapy: Secondary | ICD-10-CM | POA: Diagnosis not present

## 2017-11-18 DIAGNOSIS — E559 Vitamin D deficiency, unspecified: Secondary | ICD-10-CM | POA: Diagnosis not present

## 2017-11-18 DIAGNOSIS — R809 Proteinuria, unspecified: Secondary | ICD-10-CM | POA: Diagnosis not present

## 2017-11-18 DIAGNOSIS — N184 Chronic kidney disease, stage 4 (severe): Secondary | ICD-10-CM | POA: Diagnosis not present

## 2017-11-18 DIAGNOSIS — D631 Anemia in chronic kidney disease: Secondary | ICD-10-CM | POA: Diagnosis not present

## 2017-11-19 DIAGNOSIS — E1142 Type 2 diabetes mellitus with diabetic polyneuropathy: Secondary | ICD-10-CM | POA: Diagnosis not present

## 2017-11-19 DIAGNOSIS — B351 Tinea unguium: Secondary | ICD-10-CM | POA: Diagnosis not present

## 2017-11-23 DIAGNOSIS — E872 Acidosis: Secondary | ICD-10-CM | POA: Diagnosis not present

## 2017-11-23 DIAGNOSIS — I10 Essential (primary) hypertension: Secondary | ICD-10-CM | POA: Diagnosis not present

## 2017-11-23 DIAGNOSIS — E1129 Type 2 diabetes mellitus with other diabetic kidney complication: Secondary | ICD-10-CM | POA: Diagnosis not present

## 2017-11-23 DIAGNOSIS — N184 Chronic kidney disease, stage 4 (severe): Secondary | ICD-10-CM | POA: Diagnosis not present

## 2017-11-23 DIAGNOSIS — D649 Anemia, unspecified: Secondary | ICD-10-CM | POA: Diagnosis not present

## 2017-11-23 DIAGNOSIS — I509 Heart failure, unspecified: Secondary | ICD-10-CM | POA: Diagnosis not present

## 2017-11-23 DIAGNOSIS — E875 Hyperkalemia: Secondary | ICD-10-CM | POA: Diagnosis not present

## 2017-11-23 DIAGNOSIS — R809 Proteinuria, unspecified: Secondary | ICD-10-CM | POA: Diagnosis not present

## 2017-11-24 ENCOUNTER — Telehealth: Payer: Self-pay | Admitting: Family Medicine

## 2017-11-24 DIAGNOSIS — R1011 Right upper quadrant pain: Secondary | ICD-10-CM | POA: Diagnosis not present

## 2017-11-24 DIAGNOSIS — N184 Chronic kidney disease, stage 4 (severe): Secondary | ICD-10-CM | POA: Diagnosis not present

## 2017-11-24 DIAGNOSIS — M47896 Other spondylosis, lumbar region: Secondary | ICD-10-CM | POA: Diagnosis not present

## 2017-11-24 DIAGNOSIS — I4510 Unspecified right bundle-branch block: Secondary | ICD-10-CM | POA: Diagnosis not present

## 2017-11-24 DIAGNOSIS — K8020 Calculus of gallbladder without cholecystitis without obstruction: Secondary | ICD-10-CM | POA: Diagnosis not present

## 2017-11-24 DIAGNOSIS — K833 Fistula of bile duct: Secondary | ICD-10-CM | POA: Diagnosis not present

## 2017-11-24 DIAGNOSIS — Z794 Long term (current) use of insulin: Secondary | ICD-10-CM | POA: Diagnosis not present

## 2017-11-24 DIAGNOSIS — Z79899 Other long term (current) drug therapy: Secondary | ICD-10-CM | POA: Diagnosis not present

## 2017-11-24 NOTE — Telephone Encounter (Signed)
Patients wife called in this morning stating that Dr.Charles Lyles instructed patient this morning to go to ER due to drainage from his wound that is located on his side. She wanted to let DrSimpson know.  Cb#: [phone]

## 2017-11-24 NOTE — Telephone Encounter (Signed)
noted 

## 2017-11-25 ENCOUNTER — Other Ambulatory Visit: Payer: Self-pay | Admitting: "Endocrinology

## 2017-11-25 MED ORDER — INSULIN ISOPHANE & REGULAR (HUMAN 70-30)100 UNIT/ML KWIKPEN: 10.0000 [IU] | PEN_INJECTOR | Freq: Two times a day (BID) | SUBCUTANEOUS | 2 refills | Status: DC

## 2017-11-25 NOTE — Telephone Encounter (Signed)
Left message for pt to call.

## 2017-11-25 NOTE — Telephone Encounter (Signed)
Chase [name] is calling stating that she went to [facility] in [place] to pick up insulin HUMALOG MIX 75/25 KWIKPEN (75-25) 100 UNIT/ML Kwikpen  For [place] and it was going to cost her 400 dollars and she states that she cant pay that, please advise?

## 2017-11-25 NOTE — Telephone Encounter (Signed)
She does not have to pay that much money. We will send Novolin 70/30 to use in place of Humalog 75/25.

## 2017-11-26 NOTE — Telephone Encounter (Signed)
Pt.notified

## 2017-12-08 ENCOUNTER — Telehealth: Payer: Self-pay | Admitting: "Endocrinology

## 2017-12-08 ENCOUNTER — Other Ambulatory Visit: Payer: Self-pay | Admitting: "Endocrinology

## 2017-12-08 NOTE — Telephone Encounter (Signed)
Chase [name] needs refill on glucose blood (ONE TOUCH ULTRA TEST) test strip Chase [name] is asking if she can get more than 1 refill at a time because she is having to go back & forth to the pharmacy so much, please advise?

## 2017-12-09 ENCOUNTER — Telehealth: Payer: Self-pay | Admitting: Family Medicine

## 2017-12-09 NOTE — Telephone Encounter (Signed)
Both Knees, hurting, sore, cant get around good, can he get a shot, and also can you call to talk about Tamsulosin, when should he take it ---

## 2017-12-10 ENCOUNTER — Other Ambulatory Visit: Payer: Self-pay | Admitting: Family Medicine

## 2017-12-10 MED ORDER — CLOPIDOGREL BISULFATE 75 MG PO TABS: 75.0000 mg | ORAL_TABLET | Freq: Every day | ORAL | 3 refills | Status: DC

## 2017-12-10 NOTE — Telephone Encounter (Signed)
Wife states knees do not hurt but weak. Declined PT for now

## 2017-12-24 DIAGNOSIS — N184 Chronic kidney disease, stage 4 (severe): Secondary | ICD-10-CM | POA: Diagnosis not present

## 2017-12-24 DIAGNOSIS — D631 Anemia in chronic kidney disease: Secondary | ICD-10-CM | POA: Diagnosis not present

## 2017-12-27 NOTE — Telephone Encounter (Signed)
Should he still be on plavix?

## 2018-01-02 ENCOUNTER — Other Ambulatory Visit: Payer: Self-pay | Admitting: Family Medicine

## 2018-01-02 DIAGNOSIS — I10 Essential (primary) hypertension: Secondary | ICD-10-CM

## 2018-01-20 DIAGNOSIS — N184 Chronic kidney disease, stage 4 (severe): Secondary | ICD-10-CM | POA: Diagnosis not present

## 2018-01-20 DIAGNOSIS — D631 Anemia in chronic kidney disease: Secondary | ICD-10-CM | POA: Diagnosis not present

## 2018-01-28 DIAGNOSIS — B351 Tinea unguium: Secondary | ICD-10-CM | POA: Diagnosis not present

## 2018-01-28 DIAGNOSIS — E1142 Type 2 diabetes mellitus with diabetic polyneuropathy: Secondary | ICD-10-CM | POA: Diagnosis not present

## 2018-02-01 ENCOUNTER — Ambulatory Visit (INDEPENDENT_AMBULATORY_CARE_PROVIDER_SITE_OTHER): Payer: Medicare Other | Admitting: Urology

## 2018-02-01 DIAGNOSIS — N401 Enlarged prostate with lower urinary tract symptoms: Secondary | ICD-10-CM | POA: Diagnosis not present

## 2018-02-01 DIAGNOSIS — R351 Nocturia: Secondary | ICD-10-CM

## 2018-02-01 DIAGNOSIS — R350 Frequency of micturition: Secondary | ICD-10-CM

## 2018-02-08 ENCOUNTER — Ambulatory Visit: Payer: Medicare Other | Admitting: "Endocrinology

## 2018-02-08 DIAGNOSIS — N184 Chronic kidney disease, stage 4 (severe): Secondary | ICD-10-CM | POA: Diagnosis not present

## 2018-02-08 DIAGNOSIS — E1122 Type 2 diabetes mellitus with diabetic chronic kidney disease: Secondary | ICD-10-CM | POA: Diagnosis not present

## 2018-02-08 DIAGNOSIS — E038 Other specified hypothyroidism: Secondary | ICD-10-CM | POA: Diagnosis not present

## 2018-02-08 DIAGNOSIS — E1165 Type 2 diabetes mellitus with hyperglycemia: Secondary | ICD-10-CM | POA: Diagnosis not present

## 2018-02-08 DIAGNOSIS — Z794 Long term (current) use of insulin: Secondary | ICD-10-CM | POA: Diagnosis not present

## 2018-02-09 ENCOUNTER — Ambulatory Visit (INDEPENDENT_AMBULATORY_CARE_PROVIDER_SITE_OTHER): Payer: Medicare Other | Admitting: Family Medicine

## 2018-02-09 ENCOUNTER — Other Ambulatory Visit: Payer: Self-pay | Admitting: "Endocrinology

## 2018-02-09 ENCOUNTER — Encounter: Payer: Self-pay | Admitting: Family Medicine

## 2018-02-09 VITALS — BP 130/70 | HR 72 | Resp 16 | Ht 68.0 in | Wt 173.0 lb

## 2018-02-09 DIAGNOSIS — I10 Essential (primary) hypertension: Secondary | ICD-10-CM

## 2018-02-09 DIAGNOSIS — G40209 Localization-related (focal) (partial) symptomatic epilepsy and epileptic syndromes with complex partial seizures, not intractable, without status epilepticus: Secondary | ICD-10-CM

## 2018-02-09 DIAGNOSIS — N184 Chronic kidney disease, stage 4 (severe): Secondary | ICD-10-CM | POA: Diagnosis not present

## 2018-02-09 DIAGNOSIS — L309 Dermatitis, unspecified: Secondary | ICD-10-CM

## 2018-02-09 DIAGNOSIS — E1021 Type 1 diabetes mellitus with diabetic nephropathy: Secondary | ICD-10-CM | POA: Diagnosis not present

## 2018-02-09 DIAGNOSIS — E038 Other specified hypothyroidism: Secondary | ICD-10-CM | POA: Diagnosis not present

## 2018-02-09 DIAGNOSIS — K833 Fistula of bile duct: Secondary | ICD-10-CM

## 2018-02-09 DIAGNOSIS — E1022 Type 1 diabetes mellitus with diabetic chronic kidney disease: Secondary | ICD-10-CM

## 2018-02-09 LAB — HEMOGLOBIN A1C
Hgb A1c MFr Bld: 6.7 % of total Hgb — ABNORMAL HIGH (ref <5.7)
Mean Plasma Glucose: 146 (calc)
eAG (mmol/L): 8.1 (calc)

## 2018-02-09 LAB — RENAL FUNCTION PANEL
Albumin: 4.1 g/dL (ref 3.6–5.1)
BUN/Creatinine Ratio: 14 (calc) (ref 6–22)
BUN: 50 mg/dL — ABNORMAL HIGH (ref 7–25)
CO2: 23 mmol/L (ref 20–32)
Calcium: 9.2 mg/dL (ref 8.6–10.3)
Chloride: 107 mmol/L (ref 98–110)
Creat: 3.48 mg/dL — ABNORMAL HIGH (ref 0.70–1.11)
Glucose, Bld: 155 mg/dL — ABNORMAL HIGH (ref 65–139)
Phosphorus: 3 mg/dL (ref 2.1–4.3)
Potassium: 4 mmol/L (ref 3.5–5.3)
Sodium: 140 mmol/L (ref 135–146)

## 2018-02-09 LAB — T4, FREE: Free T4: 1.3 ng/dL (ref 0.8–1.8)

## 2018-02-09 LAB — TSH: TSH: 0.08 mIU/L — ABNORMAL LOW (ref 0.40–4.50)

## 2018-02-09 NOTE — Patient Instructions (Addendum)
F/U in 5 months, call if you need me before  Fasting lipid and hepatic 1 week before follow up  Please contact the office with the name of the surgeon as soon as possible, who has been following you for the gall bladder and bile duct drainage, you need to go back since you are draining a lot   Your blood pressure and ;labs are very good  Lungs are clear.  You are referred for eye exam and also to see Dr Nevada Crane

## 2018-02-10 ENCOUNTER — Telehealth: Payer: Self-pay | Admitting: Family Medicine

## 2018-02-10 NOTE — Telephone Encounter (Signed)
Pt is calling to advise that  The surgery was in 2015 At [facility]  With Dr Georganna Skeans  # [phone]

## 2018-02-14 ENCOUNTER — Encounter: Payer: Self-pay | Admitting: Family Medicine

## 2018-02-14 DIAGNOSIS — K833 Fistula of bile duct: Secondary | ICD-10-CM | POA: Insufficient documentation

## 2018-02-14 NOTE — Assessment & Plan Note (Signed)
Controlled, no change in medication  

## 2018-02-14 NOTE — Assessment & Plan Note (Signed)
Increased drainage noted from fistula in past 1 to 2 weeks by spouse , milky in appearance, will refer to surgeon to re evaluate , no h/o fevr or chills  Or abdominal pain or distension

## 2018-02-14 NOTE — Telephone Encounter (Signed)
Noted and referral completed

## 2018-02-14 NOTE — Assessment & Plan Note (Signed)
Controlled, no change in medication DASH diet and commitment to daily physical activity for a minimum of 30 minutes discussed and encouraged, as a part of hypertension management. The importance of attaining a healthy weight is also discussed.  BP/Weight 02/09/2018 10/06/2017 09/08/2017 08/11/2017 05/07/2017 4/36/0165 8/0/0634  Systolic BP 949 447 395 844 171 278 718  Diastolic BP 70 70 74 73 66 74 52  Wt. (Lbs) 173 175.08 170 - 173.6 172 -  BMI 26.3 26.62 25.85 - 26.4 26.15 -

## 2018-02-14 NOTE — Assessment & Plan Note (Signed)
Controlled, no change in medication Chase [name] is reminded of the importance of commitment to daily physical activity for 30 minutes or more, as able and the need to limit carbohydrate intake to 30 to 60 grams per meal to help with blood sugar control.   The need to take medication as prescribed, test blood sugar as directed, and to call between visits if there is a concern that blood sugar is uncontrolled is also discussed.   Chase [name] is reminded of the importance of daily foot exam, annual eye examination, and good blood sugar, blood pressure and cholesterol control.  Diabetic Labs Latest Ref Rng & Units 02/08/2018 09/08/2017 08/04/2017 02/26/2017 01/29/2017  HbA1c <5.7 % of total Hgb 6.7(H) - 6.2(H) - 5.5  Microalbumin Not Estab. ug/mL - - - 194.2(H) -  Micro/Creat Ratio 0.0 - 30.0 mg/g creat - - - 278.2(H) -  Chol <200 mg/dL - 159 - - -  HDL >40 mg/dL - 68 - - -  Calc LDL <130 mg/dL - - - - -  Triglycerides <150 mg/dL - 118 - - -  Creatinine 0.70 - 1.11 mg/dL 3.48(H) - 3.31(H) - 3.13(H)   BP/Weight 02/09/2018 10/06/2017 09/08/2017 08/11/2017 05/07/2017 05/05/510 0/01/1116  Systolic BP 356 701 410 301 314 388 875  Diastolic BP 70 70 74 73 66 74 52  Wt. (Lbs) 173 175.08 170 - 173.6 172 -  BMI 26.3 26.62 25.85 - 26.4 26.15 -   Foot/eye exam completion dates Latest Ref Rng & Units 02/25/2017 11/12/2015  Eye Exam No Retinopathy - -  Foot exam Order - - -  Foot Form Completion - Done Done

## 2018-02-14 NOTE — Assessment & Plan Note (Signed)
Rash spereading and worsened , will see dermatology next week

## 2018-02-14 NOTE — Progress Notes (Signed)
Chase [name]     MRN: [id number]      DOB: 01-12-1931   HPI Chase [name] is here for follow up and re-evaluation of chronic medical conditions, medication management and review of any available recent lab and radiology data.  Preventive health is updated, specifically  Cancer screening and Immunization.  Needs eye exam C/o pruritic rash on chest and thigh and despite use of multiple agents no improvement , has appt with dermatology next week C/o increased drainage from site where he had drain from bile duct for acute cholecystitis several years ago noted in recent times, no associated fever or chills  The PT denies any adverse reactions to current medications since the last visit.  Denies polyuria, polydipsia, blurred vision , or hypoglycemic episodes.     ROS Denies recent fever or chills. Denies sinus pressure, nasal congestion, ear pain or sore throat. Denies chest congestion, productive cough or wheezing. Denies chest pains, palpitations and leg swelling Denies abdominal pain, nausea, vomiting,diarrhea or constipation.   Denies dysuria, frequency,  C/o hesitancy and incontinence. C/o chronic  joint pain, swelling and limitation in mobility. Denies headaches, seizures,c/o chronic  numbness, and  Tingling.in feet and hands Denies depression, anxiety or insomnia.    PE  BP 130/70   Pulse 72   Resp 16   Ht 5\' 8"  (1.727 m)   Wt 173 lb (78.5 kg)   SpO2 99%   BMI 26.30 kg/m   Patient alert and oriented and in no cardiopulmonary distress.  HEENT: No facial asymmetry, EOMI,   oropharynx pink and moist.  Neck decreased ROM no JVD, no mass.  Chest: Clear to auscultation bilaterally.  CVS: S1, S2 no murmurs, no S3.Regular rate.  ABD: Soft non tender. Excess milky drainage from fistula at RUQ noted  Ext: No edema  MS: decreased  ROM spine, shoulders, hips and knees.  Skin: hyperpigmented macular papular lesion on anterior chest, .hyperpigmented macular skin lesions on lower  extremities  Psych: Good eye contact, normal affect. Memory intact not anxious or depressed appearing.  CNS: CN 2-12 intact, power,  normal throughout.  Assessment & Plan  Essential hypertension, benign Controlled, no change in medication DASH diet and commitment to daily physical activity for a minimum of 30 minutes discussed and encouraged, as a part of hypertension management. The importance of attaining a healthy weight is also discussed.  BP/Weight 02/09/2018 10/06/2017 09/08/2017 08/11/2017 05/07/2017 7/35/3299 01/10/2682  Systolic BP 419 622 297 989 211 941 740  Diastolic BP 70 70 74 73 66 74 52  Wt. (Lbs) 173 175.08 170 - 173.6 172 -  BMI 26.3 26.62 25.85 - 26.4 26.15 -       Dermatitis Rash spereading and worsened , will see dermatology next week  Fistula of bile duct Increased drainage noted from fistula in past 1 to 2 weeks by spouse , milky in appearance, will refer to surgeon to re evaluate , no h/o fevr or chills  Or abdominal pain or distension  Hypothyroidism Controlled, no change in medication   Seizure disorder, complex partial (HCC) Controlled, no change in medication   Type 1 diabetes mellitus with diabetic nephropathy (HCC) Controlled, no change in medication Chase [name] is reminded of the importance of commitment to daily physical activity for 30 minutes or more, as able and the need to limit carbohydrate intake to 30 to 60 grams per meal to help with blood sugar control.   The need to take medication as prescribed, test blood sugar  as directed, and to call between visits if there is a concern that blood sugar is uncontrolled is also discussed.   Chase [name] is reminded of the importance of daily foot exam, annual eye examination, and good blood sugar, blood pressure and cholesterol control.  Diabetic Labs Latest Ref Rng & Units 02/08/2018 09/08/2017 08/04/2017 02/26/2017 01/29/2017  HbA1c <5.7 % of total Hgb 6.7(H) - 6.2(H) - 5.5  Microalbumin Not Estab. ug/mL - - -  194.2(H) -  Micro/Creat Ratio 0.0 - 30.0 mg/g creat - - - 278.2(H) -  Chol <200 mg/dL - 159 - - -  HDL >40 mg/dL - 68 - - -  Calc LDL <130 mg/dL - - - - -  Triglycerides <150 mg/dL - 118 - - -  Creatinine 0.70 - 1.11 mg/dL 3.48(H) - 3.31(H) - 3.13(H)   BP/Weight 02/09/2018 10/06/2017 09/08/2017 08/11/2017 05/07/2017 2/56/3893 06/08/4286  Systolic BP 681 157 262 035 597 416 384  Diastolic BP 70 70 74 73 66 74 52  Wt. (Lbs) 173 175.08 170 - 173.6 172 -  BMI 26.3 26.62 25.85 - 26.4 26.15 -   Foot/eye exam completion dates Latest Ref Rng & Units 02/25/2017 11/12/2015  Eye Exam No Retinopathy - -  Foot exam Order - - -  Foot Form Completion - Done Done

## 2018-02-16 ENCOUNTER — Telehealth: Payer: Self-pay | Admitting: Family Medicine

## 2018-02-16 ENCOUNTER — Encounter: Payer: Self-pay | Admitting: "Endocrinology

## 2018-02-16 ENCOUNTER — Ambulatory Visit (INDEPENDENT_AMBULATORY_CARE_PROVIDER_SITE_OTHER): Payer: Medicare Other | Admitting: "Endocrinology

## 2018-02-16 VITALS — BP 142/78 | HR 76 | Ht 68.0 in

## 2018-02-16 DIAGNOSIS — E1122 Type 2 diabetes mellitus with diabetic chronic kidney disease: Secondary | ICD-10-CM | POA: Diagnosis not present

## 2018-02-16 DIAGNOSIS — E038 Other specified hypothyroidism: Secondary | ICD-10-CM | POA: Diagnosis not present

## 2018-02-16 DIAGNOSIS — E1165 Type 2 diabetes mellitus with hyperglycemia: Secondary | ICD-10-CM

## 2018-02-16 DIAGNOSIS — E782 Mixed hyperlipidemia: Secondary | ICD-10-CM | POA: Diagnosis not present

## 2018-02-16 DIAGNOSIS — N184 Chronic kidney disease, stage 4 (severe): Secondary | ICD-10-CM | POA: Diagnosis not present

## 2018-02-16 DIAGNOSIS — I10 Essential (primary) hypertension: Secondary | ICD-10-CM

## 2018-02-16 DIAGNOSIS — Z794 Long term (current) use of insulin: Secondary | ICD-10-CM | POA: Diagnosis not present

## 2018-02-16 DIAGNOSIS — IMO0002 Reserved for concepts with insufficient information to code with codable children: Secondary | ICD-10-CM

## 2018-02-16 MED ORDER — LEVOTHYROXINE SODIUM 137 MCG PO TABS: 137.0000 ug | ORAL_TABLET | Freq: Every morning | ORAL | 3 refills | Status: DC

## 2018-02-16 NOTE — Telephone Encounter (Signed)
Will fax to [place]

## 2018-02-16 NOTE — Progress Notes (Signed)
Subjective:    Patient ID: Chase [name], male    DOB: 03/31/1931, PCP [name], MD   Past Medical History:  Diagnosis Date  . Bradycardia 03/15/2012  . Carotid artery occlusion   . CKD (chronic kidney disease) stage 4, GFR 15-29 ml/min (HCC)   . 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 CVA (cerebrovascular accident) ([place]) 09/07/12  . Depressive disorder, not elsewhere classified   . Diabetes mellitus approx 1994  . Diabetic neuropathy ([place])   . Hypertensive heart disease   . Hypothyroidism approx 2000  . Lacunar stroke, acute ([place]) 03/14/2012  . Obesity   . Osteoarthrosis, unspecified whether generalized or localized, lower leg   . Other and unspecified hyperlipidemia   . Peripheral vascular disease, unspecified ([place])   . Seizures ([place])   . Spinal stenosis, unspecified region other than cervical   . Spondylosis of unspecified site without mention of myelopathy    Past Surgical History:  Procedure Laterality Date  . COLONOSCOPY N/A 08/10/2013   Procedure: COLONOSCOPY;  Surgeon: Rogene Houston, MD;  Location: [facility];  Service: Endoscopy;  Laterality: N/A;  240  . kidney stones left x2  1975  . KIDNEY SURGERY     Ruptured left kidney 30 yrs ago  from a kidney stone   Social History   Socioeconomic History  . Marital status: Married    Spouse name: None  . Number of children: 5  . Years of education: None  . Highest education level: None  Social Needs  . Financial resource strain: None  . Food insecurity - worry: None  . Food insecurity - inability: None  . Transportation needs - medical: None  . Transportation needs - non-medical: None  Occupational History  . Occupation: retired     Fish farm manager: RETIRED  Tobacco Use  . Smoking status: Never Smoker  . Smokeless tobacco: Never Used  Substance and Sexual Activity  . Alcohol use: No    Alcohol/week: 0.0 oz  . Drug use: No  . Sexual activity: No  Other Topics  Concern  . None  Social History Narrative  . None   Outpatient Encounter Medications as of 02/16/2018  Medication Sig  . amLODipine (NORVASC) 10 MG tablet TAKE 1 TABLET BY MOUTH ONCE DAILY  . aspirin EC 81 MG tablet Take 1 tablet (81 mg total) by mouth daily.  . blood glucose meter kit and supplies KIT Dispense based on patient and insurance preference. Use up to two times daily as directed. (FOR ICD 10 E11.65)  . chlorthalidone (HYGROTON) 50 MG tablet TAKE 1 TABLET BY MOUTH ONCE DAILY  . clopidogrel (PLAVIX) 75 MG tablet TAKE 1 TABLET BY MOUTH ONCE DAILY  . glucose blood (ONE TOUCH ULTRA TEST) test strip USE 1 STRIP TO CHECK GLUCOSE UP TO TWICE DAILY E11.65  . glucose blood test strip 1 each by Other route 2 (two) times daily. Use as instructed bid E11.65  . Insulin Isophane & Regular Human (NOVOLIN 70/30 FLEXPEN RELION) (70-30) 100 UNIT/ML PEN Inject 10 Units into the skin 2 (two) times daily before a meal.  . levETIRAcetam (KEPPRA) 500 MG tablet Take 250 mg by mouth at bedtime.   Marland Kitchen levothyroxine (SYNTHROID, LEVOTHROID) 137 MCG tablet Take 1 tablet (137 mcg total) by mouth every morning.  . Multiple Vitamins-Minerals (EYE VITAMINS PO) Take 1 tablet by mouth daily.   Marland Kitchen olopatadine (PATANOL) 0.1 % ophthalmic solution Place 1 drop  into both eyes 2 (two) times daily.  . ONE TOUCH ULTRA TEST test strip USE 1 STRIP TO CHECK GLUCOSE TWICE DAILY  . ONETOUCH DELICA LANCETS 77L MISC   . tamsulosin (FLOMAX) 0.4 MG CAPS capsule Take 0.4 mg by mouth.  . Vitamin D, Ergocalciferol, (DRISDOL) 50000 units CAPS capsule TAKE 1 CAPSULE BY MOUTH ONCE A WEEK  . [DISCONTINUED] levothyroxine (SYNTHROID, LEVOTHROID) 150 MCG tablet TAKE 1 TABLET BY MOUTH IN THE MORNING  . [DISCONTINUED] loratadine (CLARITIN) 10 MG tablet One tablet once daily, as needed, for uncontrolled allergy symptoms (Patient not taking: Reported on 02/09/2018)  . [DISCONTINUED] lovastatin (MEVACOR) 40 MG tablet TAKE 1 TABLET BY MOUTH AT BEDTIME    No facility-administered encounter medications on file as of 02/16/2018.    ALLERGIES: Allergies  Allergen Reactions  . Bayer Advanced Aspirin [Aspirin] Nausea And Vomiting  . Penicillins Nausea And Vomiting   VACCINATION STATUS: Immunization History  Administered Date(s) Administered  . H1N1 11/14/2008  . Influenza Split 10/07/2011, 09/08/2012  . Influenza Whole 08/22/2007, 08/27/2010  . Influenza,inj,Quad PF,6+ Mos 08/22/2013, 09/26/2014, 09/26/2015, 09/01/2016, 09/20/2017  . Pneumococcal Conjugate-13 06/12/2014  . Pneumococcal Polysaccharide-23 05/21/2004  . Td 05/21/2004  . Tdap 10/07/2011    Diabetes  He presents for his follow-up diabetic visit. He has type 2 diabetes mellitus. Onset time: He was diagnosed at approximate age of 66 years. His disease course has been stable. There are no hypoglycemic associated symptoms. Pertinent negatives for hypoglycemia include no confusion, headaches, pallor or seizures. There are no diabetic associated symptoms. Pertinent negatives for diabetes include no chest pain, no fatigue, no polydipsia, no polyphagia, no polyuria and no weakness. There are no hypoglycemic complications. Symptoms are stable. Diabetic complications include nephropathy and PVD. Risk factors for coronary artery disease include diabetes mellitus, dyslipidemia, male sex, obesity and sedentary lifestyle. Current diabetic treatment includes insulin injections. He is compliant with treatment most of the time. His weight is decreasing steadily. He is following a diabetic diet. When asked about meal planning, he reported none. He never participates in exercise. His home blood glucose trend is decreasing steadily. His breakfast blood glucose range is generally 140-180 mg/dl. His dinner blood glucose range is generally 180-200 mg/dl. His overall blood glucose range is 180-200 mg/dl.  Hyperlipidemia  This is a chronic problem. The current episode started more than 1 year ago.  Exacerbating diseases include diabetes and hypothyroidism. Pertinent negatives include no chest pain, myalgias or shortness of breath. Current antihyperlipidemic treatment includes statins. Risk factors for coronary artery disease include dyslipidemia, diabetes mellitus, hypertension, male sex and a sedentary lifestyle.  Hypertension  This is a chronic problem. The current episode started more than 1 year ago. Pertinent negatives include no chest pain, headaches, neck pain, palpitations or shortness of breath. Risk factors for coronary artery disease include diabetes mellitus, dyslipidemia and sedentary lifestyle. Past treatments include direct vasodilators. Hypertensive end-organ damage includes PVD. Identifiable causes of hypertension include a thyroid problem.  Thyroid Problem  Presents for follow-up visit. Patient reports no constipation, diarrhea, fatigue or palpitations. The symptoms have been stable. Past treatments include levothyroxine. His past medical history is significant for diabetes and hyperlipidemia.     Review of Systems  Constitutional: Negative for chills, fatigue, fever and unexpected weight change.  HENT: Negative for dental problem, mouth sores and trouble swallowing.   Eyes: Negative for visual disturbance.  Respiratory: Negative for cough, choking, chest tightness, shortness of breath and wheezing.   Cardiovascular: Negative for chest pain, palpitations and leg  swelling.  Gastrointestinal: Negative for abdominal distention, abdominal pain, constipation, diarrhea, nausea and vomiting.  Endocrine: Negative for polydipsia, polyphagia and polyuria.  Genitourinary: Negative for dysuria, flank pain, hematuria and urgency.  Musculoskeletal: Positive for gait problem. Negative for back pain, myalgias and neck pain.       Walks with a walker, due to disequilibrium and arthritis.  Skin: Negative for pallor, rash and wound.  Neurological: Negative for seizures, syncope, weakness,  numbness and headaches.  Psychiatric/Behavioral: Negative.  Negative for confusion and dysphoric mood.    Objective:    BP (!) 142/78   Pulse 76   Ht '5\' 8"'  (1.727 m)   BMI 26.30 kg/m   Wt Readings from Last 3 Encounters:  02/09/18 173 lb (78.5 kg)  10/06/17 175 lb 1.3 oz (79.4 kg)  09/08/17 170 lb (77.1 kg)    Physical Exam  Constitutional: He is oriented to person, place, and time. He appears well-developed and well-nourished. He is cooperative. No distress.  HENT:  Head: Normocephalic and atraumatic.  Eyes: EOM are normal.  Neck: Normal range of motion. Neck supple. No tracheal deviation present. No thyromegaly present.  Cardiovascular: Normal rate, S1 normal, S2 normal and normal heart sounds. Exam reveals no gallop.  No murmur heard. Pulses:      Dorsalis pedis pulses are 1+ on the right side, and 1+ on the left side.       Posterior tibial pulses are 1+ on the right side, and 1+ on the left side.  Pulmonary/Chest: Breath sounds normal. No respiratory distress. He has no wheezes.  Abdominal: Soft. Bowel sounds are normal. He exhibits no distension. There is no tenderness. There is no guarding and no CVA tenderness.  Musculoskeletal: He exhibits no edema.       Right shoulder: He exhibits no deformity.       Left ankle: He exhibits no swelling.       Right foot: There is no swelling.       Left foot: There is no swelling.  Neurological: He is alert and oriented to person, place, and time. He has normal strength and normal reflexes. No cranial nerve deficit or sensory deficit. Gait normal.  Skin: Skin is warm and dry. No rash noted. No cyanosis. Nails show no clubbing.  Psychiatric: He has a normal mood and affect. His speech is normal. Cognition and memory are normal.  He has moderate cognitive deficit.    CMP     Component Value Date/Time   NA 140 02/08/2018 1411   K 4.0 02/08/2018 1411   CL 107 02/08/2018 1411   CO2 23 02/08/2018 1411   GLUCOSE 155 (H) 02/08/2018  1411   BUN 50 (H) 02/08/2018 1411   CREATININE 3.48 (H) 02/08/2018 1411   CALCIUM 9.2 02/08/2018 1411   PROT 6.8 08/04/2017 1235   ALBUMIN 4.0 08/04/2017 1235   AST 18 08/04/2017 1235   ALT 15 08/04/2017 1235   ALKPHOS 104 08/04/2017 1235   BILITOT 0.4 08/04/2017 1235   GFRNONAA 17 (L) 01/29/2017 1349   GFRAA 20 (L) 01/29/2017 1349   Diabetic Labs (most recent): Lab Results  Component Value Date   HGBA1C 6.7 (H) 02/08/2018   HGBA1C 6.2 (H) 08/04/2017   HGBA1C 5.5 01/29/2017    Lipid Panel     Component Value Date/Time   CHOL 159 09/08/2017 1549   TRIG 118 09/08/2017 1549   HDL 68 09/08/2017 1549   CHOLHDL 2.3 09/08/2017 1549   VLDL 8 03/12/2016 1139  [place] 71 09/08/2017 1549     Assessment & Plan:   1.  type 2 diabetes mellitus with stage 4 chronic kidney disease, with long-term current use of insulin (HCC) -his diabetes is  complicated by chronic kidney disease stage IV and patient remains at a high risk for more acute and chronic complications of diabetes which include CAD, CVA, CKD, retinopathy, and neuropathy. These are all discussed in detail with the patient.  Patient came with A1c of 6.7%, acceptable target for him.  Glucose logs and insulin administration records pertaining to this visit,  to be scanned into patient's records.  Recent labs reviewed.   - I have re-counseled the patient on diet management   by adopting a carbohydrate restricted / protein rich  Diet.  - Suggestion is made for him to avoid simple carbohydrates  from his diet including Cakes, Sweet Desserts, Ice Cream, Soda (diet and regular), Sweet Tea, Candies, Chips, Cookies, Store Bought Juices, Alcohol in Excess of  1-2 drinks a day, Artificial Sweeteners, and "Sugar-free" Products. This will help patient to have stable blood glucose profile and potentially avoid unintended weight gain.  - Patient is advised to stick to a routine mealtimes to eat 3 meals  a day and avoid unnecessary snacks ( to  snack only to correct hypoglycemia).  - I have approached patient with the following individualized plan to manage diabetes and patient agrees.  - I advised him, his daughter, and his wife who is the primary caregiver to continue 75/25  10 units twice a day with breakfast and supper when pre-meal blood glucose is above 90 mg/dL, associated with monitoring of blood glucose at least 2 times a day- before breakfast and before supper.     - Patient is warned not to take insulin without proper monitoring per orders. -Adjustment parameters are given for hypo and hyperglycemia in writing. -Patient is encouraged to call clinic for blood glucose levels less than 70 or above 300 mg /dl. -Patient is not a candidate for metformin,SGLT2 inhibitors due to CKD.  - Patient specific target  for A1c; LDL, HDL, Triglycerides, and  Waist Circumference were discussed in detail.  2) BP/HTN: His blood pressure is not controlled, acceptable at 142/78 he has enough medications.   3) Lipids/HPL: His lipid panel has been stable for 2 years, I have advised him to discontinue statins at this time.    4) hypothyroidism:  - His thyroid function tests are consistent with over replacement.    -I discussed and lowered his levothyroxine to 137 mcg p.o. every morning.     - We discussed about correct intake of levothyroxine, at fasting, with water, separated by at least 30 minutes from breakfast, and separated by more than 4 hours from calcium, iron, multivitamins, acid reflux medications (PPIs). -Patient is made aware of the fact that thyroid hormone replacement is needed for life, dose to be adjusted by periodic monitoring of thyroid function tests.  5) Chronic Care/Health Maintenance:  -Patient is on Statin medications and encouraged to continue to follow up with Ophthalmology, Podiatrist at least yearly or according to recommendations, and advised to  stay away from smoking. I have recommended yearly flu vaccine and  pneumonia vaccination at least every 5 years; he cannot exercise optimally due to deconditioning and disequilibrium, and  sleep for at least 7 hours a day.  - I advised patient to maintain close follow up with [name], MD for primary care needs.  - Time spent with the patient:  25 min, of which >50% was spent in reviewing his blood glucose logs , discussing his hypo- and hyper-glycemic episodes, reviewing his current and  previous labs and insulin doses and developing a plan to avoid hypo- and hyper-glycemia. Please refer to Patient Instructions for Blood Glucose Monitoring and Insulin/Medications Dosing Guide"  in media tab for additional information. [name] participated in the discussions, expressed understanding, and voiced agreement with the above plans.  All questions were answered to his satisfaction. he is encouraged to contact clinic should he have any questions or concerns prior to his return visit.  Follow up plan: -Return in about 6 months (around 08/19/2018) for meter, and logs.  Glade Lloyd, MD Phone: [phone]  Fax: [phone]  This note was partially dictated with voice recognition software. Similar sounding words can be transcribed inadequately or may not  be corrected upon review.  02/16/2018, 4:25 PM

## 2018-02-16 NOTE — Telephone Encounter (Signed)
Jan, [facility] in [place], left message on nurse line regarding patient's referral to the clinic. She states that the referral did not specifically state whether the patient was being referred for surgery or a program. She states that she needs clarification on that. She states that she also needs to know if patient can take appetite suppressants. He is over 84 years of age and they are needing a letter stating that it is okay for him to take appetite suppressants.   Callback# [phone]

## 2018-02-17 DIAGNOSIS — I872 Venous insufficiency (chronic) (peripheral): Secondary | ICD-10-CM | POA: Diagnosis not present

## 2018-02-17 DIAGNOSIS — N184 Chronic kidney disease, stage 4 (severe): Secondary | ICD-10-CM | POA: Diagnosis not present

## 2018-02-17 DIAGNOSIS — R809 Proteinuria, unspecified: Secondary | ICD-10-CM | POA: Diagnosis not present

## 2018-02-17 DIAGNOSIS — D631 Anemia in chronic kidney disease: Secondary | ICD-10-CM | POA: Diagnosis not present

## 2018-02-17 DIAGNOSIS — L308 Other specified dermatitis: Secondary | ICD-10-CM | POA: Diagnosis not present

## 2018-02-22 DIAGNOSIS — D638 Anemia in other chronic diseases classified elsewhere: Secondary | ICD-10-CM | POA: Diagnosis not present

## 2018-02-22 DIAGNOSIS — E872 Acidosis: Secondary | ICD-10-CM | POA: Diagnosis not present

## 2018-02-22 DIAGNOSIS — N184 Chronic kidney disease, stage 4 (severe): Secondary | ICD-10-CM | POA: Diagnosis not present

## 2018-02-22 DIAGNOSIS — I10 Essential (primary) hypertension: Secondary | ICD-10-CM | POA: Diagnosis not present

## 2018-02-24 DIAGNOSIS — E889 Metabolic disorder, unspecified: Secondary | ICD-10-CM | POA: Insufficient documentation

## 2018-02-24 DIAGNOSIS — E875 Hyperkalemia: Secondary | ICD-10-CM | POA: Insufficient documentation

## 2018-02-24 DIAGNOSIS — M898X9 Other specified disorders of bone, unspecified site: Secondary | ICD-10-CM | POA: Insufficient documentation

## 2018-02-25 ENCOUNTER — Emergency Department (HOSPITAL_COMMUNITY): Payer: Medicare Other

## 2018-02-25 ENCOUNTER — Other Ambulatory Visit: Payer: Self-pay

## 2018-02-25 ENCOUNTER — Inpatient Hospital Stay (HOSPITAL_COMMUNITY)
Admission: EM | Admit: 2018-02-25 | Discharge: 2018-03-03 | DRG: 445 | Disposition: A | Discharge status: Skilled Nursing Facility | Payer: Medicare Other | Attending: Internal Medicine | Admitting: Internal Medicine

## 2018-02-25 ENCOUNTER — Encounter (HOSPITAL_COMMUNITY): Payer: Self-pay | Admitting: Emergency Medicine

## 2018-02-25 DIAGNOSIS — M1710 Unilateral primary osteoarthritis, unspecified knee: Secondary | ICD-10-CM | POA: Diagnosis present

## 2018-02-25 DIAGNOSIS — K823 Fistula of gallbladder: Secondary | ICD-10-CM | POA: Diagnosis present

## 2018-02-25 DIAGNOSIS — I452 Bifascicular block: Secondary | ICD-10-CM | POA: Diagnosis present

## 2018-02-25 DIAGNOSIS — Z79899 Other long term (current) drug therapy: Secondary | ICD-10-CM

## 2018-02-25 DIAGNOSIS — E038 Other specified hypothyroidism: Secondary | ICD-10-CM | POA: Diagnosis not present

## 2018-02-25 DIAGNOSIS — Z87442 Personal history of urinary calculi: Secondary | ICD-10-CM

## 2018-02-25 DIAGNOSIS — E039 Hypothyroidism, unspecified: Secondary | ICD-10-CM | POA: Diagnosis present

## 2018-02-25 DIAGNOSIS — Z823 Family history of stroke: Secondary | ICD-10-CM | POA: Diagnosis not present

## 2018-02-25 DIAGNOSIS — R6889 Other general symptoms and signs: Secondary | ICD-10-CM | POA: Diagnosis not present

## 2018-02-25 DIAGNOSIS — D638 Anemia in other chronic diseases classified elsewhere: Secondary | ICD-10-CM | POA: Diagnosis present

## 2018-02-25 DIAGNOSIS — Z886 Allergy status to analgesic agent status: Secondary | ICD-10-CM

## 2018-02-25 DIAGNOSIS — Z79890 Hormone replacement therapy (postmenopausal): Secondary | ICD-10-CM

## 2018-02-25 DIAGNOSIS — R748 Abnormal levels of other serum enzymes: Secondary | ICD-10-CM | POA: Diagnosis present

## 2018-02-25 DIAGNOSIS — E785 Hyperlipidemia, unspecified: Secondary | ICD-10-CM | POA: Diagnosis present

## 2018-02-25 DIAGNOSIS — E119 Type 2 diabetes mellitus without complications: Secondary | ICD-10-CM | POA: Diagnosis not present

## 2018-02-25 DIAGNOSIS — K8050 Calculus of bile duct without cholangitis or cholecystitis without obstruction: Secondary | ICD-10-CM

## 2018-02-25 DIAGNOSIS — R5381 Other malaise: Secondary | ICD-10-CM | POA: Diagnosis present

## 2018-02-25 DIAGNOSIS — R9431 Abnormal electrocardiogram [ECG] [EKG]: Secondary | ICD-10-CM | POA: Diagnosis not present

## 2018-02-25 DIAGNOSIS — H02841 Edema of right upper eyelid: Secondary | ICD-10-CM | POA: Diagnosis not present

## 2018-02-25 DIAGNOSIS — E1165 Type 2 diabetes mellitus with hyperglycemia: Secondary | ICD-10-CM | POA: Diagnosis present

## 2018-02-25 DIAGNOSIS — N170 Acute kidney failure with tubular necrosis: Secondary | ICD-10-CM | POA: Diagnosis not present

## 2018-02-25 DIAGNOSIS — I10 Essential (primary) hypertension: Secondary | ICD-10-CM | POA: Diagnosis not present

## 2018-02-25 DIAGNOSIS — I129 Hypertensive chronic kidney disease with stage 1 through stage 4 chronic kidney disease, or unspecified chronic kidney disease: Secondary | ICD-10-CM | POA: Diagnosis not present

## 2018-02-25 DIAGNOSIS — K8051 Calculus of bile duct without cholangitis or cholecystitis with obstruction: Secondary | ICD-10-CM | POA: Diagnosis present

## 2018-02-25 DIAGNOSIS — N184 Chronic kidney disease, stage 4 (severe): Secondary | ICD-10-CM | POA: Diagnosis not present

## 2018-02-25 DIAGNOSIS — R2689 Other abnormalities of gait and mobility: Secondary | ICD-10-CM | POA: Diagnosis not present

## 2018-02-25 DIAGNOSIS — R7989 Other specified abnormal findings of blood chemistry: Secondary | ICD-10-CM

## 2018-02-25 DIAGNOSIS — Z9049 Acquired absence of other specified parts of digestive tract: Secondary | ICD-10-CM

## 2018-02-25 DIAGNOSIS — Z8673 Personal history of transient ischemic attack (TIA), and cerebral infarction without residual deficits: Secondary | ICD-10-CM | POA: Diagnosis not present

## 2018-02-25 DIAGNOSIS — Z794 Long term (current) use of insulin: Secondary | ICD-10-CM

## 2018-02-25 DIAGNOSIS — N400 Enlarged prostate without lower urinary tract symptoms: Secondary | ICD-10-CM | POA: Diagnosis not present

## 2018-02-25 DIAGNOSIS — E1140 Type 2 diabetes mellitus with diabetic neuropathy, unspecified: Secondary | ICD-10-CM | POA: Diagnosis present

## 2018-02-25 DIAGNOSIS — I639 Cerebral infarction, unspecified: Secondary | ICD-10-CM | POA: Diagnosis present

## 2018-02-25 DIAGNOSIS — E872 Acidosis, unspecified: Secondary | ICD-10-CM | POA: Diagnosis present

## 2018-02-25 DIAGNOSIS — M6281 Muscle weakness (generalized): Secondary | ICD-10-CM | POA: Diagnosis not present

## 2018-02-25 DIAGNOSIS — D631 Anemia in chronic kidney disease: Secondary | ICD-10-CM | POA: Diagnosis present

## 2018-02-25 DIAGNOSIS — Z4659 Encounter for fitting and adjustment of other gastrointestinal appliance and device: Secondary | ICD-10-CM | POA: Diagnosis not present

## 2018-02-25 DIAGNOSIS — Z880 Allergy status to penicillin: Secondary | ICD-10-CM

## 2018-02-25 DIAGNOSIS — I1310 Hypertensive heart and chronic kidney disease without heart failure, with stage 1 through stage 4 chronic kidney disease, or unspecified chronic kidney disease: Secondary | ICD-10-CM | POA: Diagnosis present

## 2018-02-25 DIAGNOSIS — N200 Calculus of kidney: Secondary | ICD-10-CM | POA: Diagnosis not present

## 2018-02-25 DIAGNOSIS — Z7902 Long term (current) use of antithrombotics/antiplatelets: Secondary | ICD-10-CM | POA: Diagnosis not present

## 2018-02-25 DIAGNOSIS — G40909 Epilepsy, unspecified, not intractable, without status epilepticus: Secondary | ICD-10-CM | POA: Diagnosis not present

## 2018-02-25 DIAGNOSIS — Z8249 Family history of ischemic heart disease and other diseases of the circulatory system: Secondary | ICD-10-CM

## 2018-02-25 DIAGNOSIS — E1059 Type 1 diabetes mellitus with other circulatory complications: Secondary | ICD-10-CM

## 2018-02-25 DIAGNOSIS — G40209 Localization-related (focal) (partial) symptomatic epilepsy and epileptic syndromes with complex partial seizures, not intractable, without status epilepticus: Secondary | ICD-10-CM | POA: Diagnosis not present

## 2018-02-25 DIAGNOSIS — K5730 Diverticulosis of large intestine without perforation or abscess without bleeding: Secondary | ICD-10-CM | POA: Diagnosis not present

## 2018-02-25 DIAGNOSIS — Z8042 Family history of malignant neoplasm of prostate: Secondary | ICD-10-CM | POA: Diagnosis not present

## 2018-02-25 DIAGNOSIS — R531 Weakness: Secondary | ICD-10-CM | POA: Diagnosis not present

## 2018-02-25 DIAGNOSIS — S0990XA Unspecified injury of head, initial encounter: Secondary | ICD-10-CM | POA: Diagnosis not present

## 2018-02-25 DIAGNOSIS — E1151 Type 2 diabetes mellitus with diabetic peripheral angiopathy without gangrene: Secondary | ICD-10-CM | POA: Diagnosis present

## 2018-02-25 DIAGNOSIS — R932 Abnormal findings on diagnostic imaging of liver and biliary tract: Secondary | ICD-10-CM | POA: Diagnosis not present

## 2018-02-25 DIAGNOSIS — Z833 Family history of diabetes mellitus: Secondary | ICD-10-CM

## 2018-02-25 DIAGNOSIS — Z7982 Long term (current) use of aspirin: Secondary | ICD-10-CM | POA: Diagnosis not present

## 2018-02-25 DIAGNOSIS — Z7401 Bed confinement status: Secondary | ICD-10-CM | POA: Diagnosis not present

## 2018-02-25 DIAGNOSIS — D649 Anemia, unspecified: Secondary | ICD-10-CM | POA: Diagnosis not present

## 2018-02-25 DIAGNOSIS — E1121 Type 2 diabetes mellitus with diabetic nephropathy: Secondary | ICD-10-CM

## 2018-02-25 DIAGNOSIS — R778 Other specified abnormalities of plasma proteins: Secondary | ICD-10-CM | POA: Diagnosis present

## 2018-02-25 DIAGNOSIS — K831 Obstruction of bile duct: Secondary | ICD-10-CM | POA: Diagnosis not present

## 2018-02-25 DIAGNOSIS — F329 Major depressive disorder, single episode, unspecified: Secondary | ICD-10-CM | POA: Diagnosis present

## 2018-02-25 DIAGNOSIS — IMO0002 Reserved for concepts with insufficient information to code with codable children: Secondary | ICD-10-CM

## 2018-02-25 DIAGNOSIS — E1122 Type 2 diabetes mellitus with diabetic chronic kidney disease: Secondary | ICD-10-CM | POA: Diagnosis not present

## 2018-02-25 DIAGNOSIS — R627 Adult failure to thrive: Secondary | ICD-10-CM | POA: Diagnosis present

## 2018-02-25 DIAGNOSIS — R279 Unspecified lack of coordination: Secondary | ICD-10-CM | POA: Diagnosis not present

## 2018-02-25 HISTORY — DX: Calculus of bile duct without cholangitis or cholecystitis without obstruction: K80.50

## 2018-02-25 LAB — CBC
HCT: 33.9 % — ABNORMAL LOW (ref 39.0–52.0)
Hemoglobin: 10.8 g/dL — ABNORMAL LOW (ref 13.0–17.0)
MCH: 30.7 pg (ref 26.0–34.0)
MCHC: 31.9 g/dL (ref 30.0–36.0)
MCV: 96.3 fL (ref 78.0–100.0)
Platelets: 218 10*3/uL (ref 150–400)
RBC: 3.52 MIL/uL — ABNORMAL LOW (ref 4.22–5.81)
RDW: 13.2 % (ref 11.5–15.5)
WBC: 8 10*3/uL (ref 4.0–10.5)

## 2018-02-25 LAB — HEPATIC FUNCTION PANEL
ALT: 492 U/L — ABNORMAL HIGH (ref 17–63)
AST: 382 U/L — ABNORMAL HIGH (ref 15–41)
Albumin: 3.3 g/dL — ABNORMAL LOW (ref 3.5–5.0)
Alkaline Phosphatase: 341 U/L — ABNORMAL HIGH (ref 38–126)
Bilirubin, Direct: 0.9 mg/dL — ABNORMAL HIGH (ref 0.1–0.5)
Indirect Bilirubin: 0.9 mg/dL (ref 0.3–0.9)
Total Bilirubin: 1.8 mg/dL — ABNORMAL HIGH (ref 0.3–1.2)
Total Protein: 6.9 g/dL (ref 6.5–8.1)

## 2018-02-25 LAB — URINALYSIS, ROUTINE W REFLEX MICROSCOPIC
Bacteria, UA: NONE SEEN
Bilirubin Urine: NEGATIVE
Glucose, UA: NEGATIVE mg/dL
Ketones, ur: NEGATIVE mg/dL
Leukocytes, UA: NEGATIVE
Nitrite: NEGATIVE
Protein, ur: 30 mg/dL — AB
Specific Gravity, Urine: 1.014 (ref 1.005–1.030)
pH: 5 (ref 5.0–8.0)

## 2018-02-25 LAB — BASIC METABOLIC PANEL
Anion gap: 11 (ref 5–15)
BUN: 52 mg/dL — ABNORMAL HIGH (ref 6–20)
CO2: 20 mmol/L — ABNORMAL LOW (ref 22–32)
Calcium: 9.3 mg/dL (ref 8.9–10.3)
Chloride: 107 mmol/L (ref 101–111)
Creatinine, Ser: 3.54 mg/dL — ABNORMAL HIGH (ref 0.61–1.24)
GFR calc Af Amer: 17 mL/min — ABNORMAL LOW (ref >60)
GFR calc non Af Amer: 15 mL/min — ABNORMAL LOW (ref >60)
Glucose, Bld: 152 mg/dL — ABNORMAL HIGH (ref 65–99)
Potassium: 3.6 mmol/L (ref 3.5–5.1)
Sodium: 138 mmol/L (ref 135–145)

## 2018-02-25 LAB — TROPONIN I: Troponin I: 0.06 ng/mL (ref <0.03)

## 2018-02-25 LAB — LIPASE, BLOOD: Lipase: 25 U/L (ref 11–51)

## 2018-02-25 LAB — CK: Total CK: 60 U/L (ref 49–397)

## 2018-02-25 LAB — BRAIN NATRIURETIC PEPTIDE: B Natriuretic Peptide: 271 pg/mL — ABNORMAL HIGH (ref 0.0–100.0)

## 2018-02-25 MED ORDER — SODIUM CHLORIDE 0.9 % IV SOLN
INTRAVENOUS | Status: DC
  Administered 2018-02-25: 18:00:00 via INTRAVENOUS

## 2018-02-25 MED ORDER — TAMSULOSIN HCL 0.4 MG PO CAPS
0.4000 mg | ORAL_CAPSULE | Freq: Every day | ORAL | Status: DC
  Administered 2018-02-26 – 2018-03-03 (×6): 0.4 mg via ORAL
  Filled 2018-02-25 (×6): qty 1

## 2018-02-25 MED ORDER — CLOPIDOGREL BISULFATE 75 MG PO TABS: 75.0000 mg | ORAL_TABLET | Freq: Every day | ORAL | Status: DC

## 2018-02-25 MED ORDER — FENTANYL CITRATE (PF) 100 MCG/2ML IJ SOLN
25.0000 ug | INTRAMUSCULAR | Status: DC | PRN
  Administered 2018-03-01: 50 ug via INTRAVENOUS
  Filled 2018-02-25 (×2): qty 2

## 2018-02-25 MED ORDER — OLOPATADINE HCL 0.1 % OP SOLN
1.0000 [drp] | Freq: Two times a day (BID) | OPHTHALMIC | Status: DC
  Administered 2018-02-26 – 2018-03-03 (×11): 1 [drp] via OPHTHALMIC
  Filled 2018-02-25: qty 5

## 2018-02-25 MED ORDER — ONDANSETRON HCL 4 MG/2ML IJ SOLN: 4.0000 mg | Freq: Four times a day (QID) | INTRAMUSCULAR | Status: DC | PRN

## 2018-02-25 MED ORDER — INSULIN ASPART 100 UNIT/ML ~~LOC~~ SOLN
0.0000 [IU] | SUBCUTANEOUS | Status: DC
Start: 2018-02-26 — End: 2018-02-26
  Administered 2018-02-26: 3 [IU] via SUBCUTANEOUS

## 2018-02-25 MED ORDER — ACETAMINOPHEN 650 MG RE SUPP: 650.0000 mg | Freq: Four times a day (QID) | RECTAL | Status: DC | PRN

## 2018-02-25 MED ORDER — ONDANSETRON HCL 4 MG/2ML IJ SOLN
4.0000 mg | Freq: Once | INTRAMUSCULAR | Status: AC
  Administered 2018-03-02: 4 mg via INTRAVENOUS
  Filled 2018-02-25: qty 2

## 2018-02-25 MED ORDER — ASPIRIN EC 81 MG PO TBEC
81.0000 mg | DELAYED_RELEASE_TABLET | Freq: Every day | ORAL | Status: DC
  Administered 2018-02-27 – 2018-02-28 (×2): 81 mg via ORAL
  Filled 2018-02-25 (×2): qty 1

## 2018-02-25 MED ORDER — LEVOTHYROXINE SODIUM 25 MCG PO TABS
137.0000 ug | ORAL_TABLET | Freq: Every day | ORAL | Status: DC
  Administered 2018-02-26 – 2018-03-03 (×6): 137 ug via ORAL
  Filled 2018-02-25 (×12): qty 1

## 2018-02-25 MED ORDER — ACETAMINOPHEN 325 MG PO TABS: 650.0000 mg | ORAL_TABLET | Freq: Four times a day (QID) | ORAL | Status: DC | PRN

## 2018-02-25 MED ORDER — SODIUM CHLORIDE 0.9% FLUSH
3.0000 mL | Freq: Two times a day (BID) | INTRAVENOUS | Status: DC
  Administered 2018-02-26 – 2018-02-28 (×4): 3 mL via INTRAVENOUS

## 2018-02-25 MED ORDER — ONDANSETRON HCL 4 MG PO TABS: 4.0000 mg | ORAL_TABLET | Freq: Four times a day (QID) | ORAL | Status: DC | PRN

## 2018-02-25 MED ORDER — AMLODIPINE BESYLATE 5 MG PO TABS
10.0000 mg | ORAL_TABLET | Freq: Every day | ORAL | Status: DC
  Administered 2018-02-26 – 2018-03-03 (×6): 10 mg via ORAL
  Filled 2018-02-25 (×6): qty 2

## 2018-02-25 MED ORDER — SODIUM CHLORIDE 0.9 % IV SOLN
INTRAVENOUS | Status: DC
  Administered 2018-02-26: 06:00:00 via INTRAVENOUS

## 2018-02-25 MED ORDER — LEVOFLOXACIN IN D5W 750 MG/150ML IV SOLN
750.0000 mg | Freq: Once | INTRAVENOUS | Status: AC
  Administered 2018-02-25: 750 mg via INTRAVENOUS
  Filled 2018-02-25: qty 150

## 2018-02-25 MED ORDER — LEVETIRACETAM 250 MG PO TABS
250.0000 mg | ORAL_TABLET | Freq: Every day | ORAL | Status: DC
  Administered 2018-02-26 – 2018-03-02 (×6): 250 mg via ORAL
  Filled 2018-02-25 (×6): qty 1

## 2018-02-25 MED ORDER — METRONIDAZOLE IN NACL 5-0.79 MG/ML-% IV SOLN
500.0000 mg | Freq: Three times a day (TID) | INTRAVENOUS | Status: DC
  Administered 2018-02-25 – 2018-03-03 (×17): 500 mg via INTRAVENOUS
  Filled 2018-02-25 (×17): qty 100

## 2018-02-25 NOTE — ED Provider Notes (Signed)
[facility] EMERGENCY DEPARTMENT Provider Note   CSN: [id number] Arrival date & time: 02/25/18  1516     History   Chief Complaint Chief Complaint  Patient presents with  . Fatigue    HPI Chase [name] is a 85 y.o. male.  Patient brought in by family for weakness that has been increasing actually for about a week but worse in the last 2 days having difficulty supporting his weight.  Both legs seem to buckle.  This occurred last evening at around 2300 wife could not get him back up so he spent the night on the floor.  Patient's past medical history is significant for renal failure followed by nephrology.  Not getting dialysis.  Wife reported an episode of vomiting yesterday no diarrhea.  They think that he has a cough.  Patient told me he felt he had some discomfort in his right upper quadrant of his abdomen.  Earlier in the week he was complaining of some chest pain but that resolved.  Patient in 2015 had acute cholecystitis was not a good candidate for removal of the gallbladder so had interventional radiology place a cholecystostomy tube.  That was removed.  Patient is having some fluid draining from that site.     Past Medical History:  Diagnosis Date  . Bradycardia 03/15/2012  . Carotid artery occlusion   . CKD (chronic kidney disease) stage 4, GFR 15-29 ml/min (HCC)   . Complete lesion of cervical spinal cord ([place]) 03/14/3012   Stable since 2006  . CVA (cerebrovascular accident) ([place]) 09/07/12  . Depressive disorder, not elsewhere classified   . Diabetes mellitus approx 1994  . Diabetic neuropathy ([place])   . Hypertensive heart disease   . Hypothyroidism approx 2000  . Lacunar stroke, acute ([place]) 03/14/2012  . Obesity   . Osteoarthrosis, unspecified whether generalized or localized, lower leg   . Other and unspecified hyperlipidemia   . Peripheral vascular disease, unspecified ([place])   . Seizures ([place])   . Spinal stenosis, unspecified region other than cervical   . Spondylosis  of unspecified site without mention of myelopathy     Patient Active Problem List   Diagnosis Date Noted  . Fistula of bile duct 02/14/2018  . Dermatitis 02/25/2017  . Essential hypertension, benign 12/13/2015  . Calculus of gallbladder with acute cholecystitis without obstruction   . RBBB (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 fascicular block)   . Hypertensive heart disease 12/01/2014  . Chronic venous insufficiency 11/16/2014  . Carotid artery disease ([place]) 05/18/2014  . Poor balance 09/11/2013  . Difficulty walking 09/11/2013  . Hx of falling 09/11/2013  . History of stroke 09/07/2012  . CKD (chronic kidney disease) stage 4, GFR 15-29 ml/min (HCC) 09/07/2012  . Anemia 09/07/2012  . Cholelithiasis 08/10/2012  . Peripheral autonomic neuropathy due to diabetes mellitus ([place]) 03/14/2012  . Seizure disorder, complex partial ([place]) 03/07/2012  . Uncontrolled type 2 diabetes mellitus with stage 4 chronic kidney disease, with long-term current use of insulin ([place])   . DIABETIC ULCER, LEFT LEG 08/15/2009  . Type 1 diabetes mellitus with diabetic nephropathy ([place]) 08/21/2008  . Hypothyroidism 03/09/2008  . Mixed hyperlipidemia 03/09/2008  . Renovascular hypertension 03/09/2008  . Peripheral vascular disease ([place]) 03/09/2008  . DEGENERATIVE JOINT DISEASE, KNEE 01/26/2008  . Osteoarthritis of spine 01/26/2008  . Spinal stenosis     Past Surgical History:  Procedure Laterality Date  . COLONOSCOPY N/A 08/10/2013   Procedure: COLONOSCOPY;  Surgeon: Rogene Houston, MD;  Location: [facility];  Service: Endoscopy;  Laterality: N/A;  240  . kidney stones left x2  1975  . KIDNEY SURGERY     Ruptured left kidney 30 yrs ago  from a kidney stone        Home Medications    Prior to Admission medications   Medication Sig Start Date End Date Taking? Authorizing Provider  amLODipine (NORVASC) 10 MG tablet TAKE 1 TABLET BY MOUTH ONCE DAILY 01/03/18   [name], MD  aspirin  EC 81 MG tablet Take 1 tablet (81 mg total) by mouth daily. 06/16/13   [name], MD  blood glucose meter kit and supplies KIT Dispense based on patient and insurance preference. Use up to two times daily as directed. (FOR ICD 10 E11.65) 01/08/17   Cassandria Anger, MD  chlorthalidone (HYGROTON) 50 MG tablet TAKE 1 TABLET BY MOUTH ONCE DAILY 01/03/18   [name], MD  clopidogrel (PLAVIX) 75 MG tablet TAKE 1 TABLET BY MOUTH ONCE DAILY 12/27/17   [name], MD  glucose blood (ONE TOUCH ULTRA TEST) test strip USE 1 STRIP TO CHECK GLUCOSE UP TO TWICE DAILY E11.65 09/14/17   Cassandria Anger, MD  glucose blood test strip 1 each by Other route 2 (two) times daily. Use as instructed bid E11.65    [provider]  Insulin Isophane & Regular Human (NOVOLIN 70/30 FLEXPEN RELION) (70-30) 100 UNIT/ML PEN Inject 10 Units into the skin 2 (two) times daily before a meal. 11/25/17   Nida, Marella Chimes, MD  levETIRAcetam (KEPPRA) 500 MG tablet Take 250 mg by mouth at bedtime.     [provider]  levothyroxine (SYNTHROID, LEVOTHROID) 137 MCG tablet Take 1 tablet (137 mcg total) by mouth every morning. 02/16/18   Cassandria Anger, MD  Multiple Vitamins-Minerals (EYE VITAMINS PO) Take 1 tablet by mouth daily.     [provider]  olopatadine (PATANOL) 0.1 % ophthalmic solution Place 1 drop into both eyes 2 (two) times daily. 01/25/17   Raylene Everts, MD  ONE TOUCH ULTRA TEST test strip USE 1 STRIP TO CHECK GLUCOSE TWICE DAILY 12/08/17   Cassandria Anger, MD  [facility] DELICA LANCETS [address] [place]  01/14/17   [provider]  tamsulosin (FLOMAX) 0.4 MG CAPS capsule Take 0.4 mg by mouth.    [provider]  Vitamin D, Ergocalciferol, (DRISDOL) 50000 units CAPS capsule TAKE 1 CAPSULE BY MOUTH ONCE A WEEK 10/04/17   [name], MD    Family History Family History  Problem Relation Age of Onset  . Diabetes Mother   . Prostate  cancer Father   . Hypertension Brother   . Hypertension Brother   . Diabetes Brother   . Stroke Brother   . Diabetes Brother   . Diabetes Daughter   . Diabetes Daughter     Social History Social History   Tobacco Use  . Smoking status: Never Smoker  . Smokeless tobacco: Never Used  Substance Use Topics  . Alcohol use: No    Alcohol/week: 0.0 oz  . Drug use: No     Allergies   Bayer advanced aspirin [aspirin] and Penicillins   Review of Systems Review of Systems  Unable to perform ROS: Dementia     Physical Exam Updated Vital Signs BP (!) 153/67   Pulse 67   Temp 98.9 F (37.2 C) (Oral)   Resp 17   Ht 1.727 m ('5\' 8"' )  Wt 78.5 kg (173 lb)   SpO2 100%   BMI 26.30 kg/m   Physical Exam  Constitutional: He appears well-developed and well-nourished. No distress.  HENT:  Head: Normocephalic and atraumatic.  Mucous membranes slightly dry.  Eyes: Pupils are equal, round, and reactive to light. Conjunctivae and EOM are normal.  Neck: Normal range of motion. Neck supple.  Cardiovascular: Normal rate.  Pulmonary/Chest: Effort normal and breath sounds normal. No respiratory distress.  Abdominal: Soft. Bowel sounds are normal. There is tenderness. There is no guarding.  Questionable slight tenderness right upper quadrant no guarding.  It appears that there is some fluid clear yellowish draining from his cholecystostomy site.  No purulence no bile.  Musculoskeletal: Normal range of motion. He exhibits no edema.  Neurological: He is alert. No cranial nerve deficit or sensory deficit. He exhibits normal muscle tone. Coordination normal.  Skin: Skin is warm.  Nursing note and vitals reviewed.    ED Treatments / Results  Labs (all labs ordered are listed, but only abnormal results are displayed) Labs Reviewed  CBC - Abnormal; Notable for the following components:      Result Value   RBC 3.52 (*)    Hemoglobin 10.8 (*)    HCT 33.9 (*)    All other components  within normal limits  URINALYSIS, ROUTINE W REFLEX MICROSCOPIC - Abnormal; Notable for the following components:   Hgb urine dipstick SMALL (*)    Protein, ur 30 (*)    Squamous Epithelial / LPF 0-5 (*)    All other components within normal limits  BASIC METABOLIC PANEL - Abnormal; Notable for the following components:   CO2 20 (*)    Glucose, Bld 152 (*)    BUN 52 (*)    Creatinine, Ser 3.54 (*)    GFR calc non Af Amer 15 (*)    GFR calc Af Amer 17 (*)    All other components within normal limits  BRAIN NATRIURETIC PEPTIDE - Abnormal; Notable for the following components:   B Natriuretic Peptide 271.0 (*)    All other components within normal limits  TROPONIN I - Abnormal; Notable for the following components:   Troponin I 0.06 (*)    All other components within normal limits  HEPATIC FUNCTION PANEL - Abnormal; Notable for the following components:   Albumin 3.3 (*)    AST 382 (*)    ALT 492 (*)    Alkaline Phosphatase 341 (*)    Total Bilirubin 1.8 (*)    Bilirubin, Direct 0.9 (*)    All other components within normal limits  LIPASE, BLOOD  CK    EKG EKG Interpretation  Date/Time:  Friday February 25 2018 15:35:19 EDT Ventricular Rate:  72 PR Interval:  200 QRS Duration: 146 QT Interval:  434 QTC Calculation: 475 R Axis:   -76 Text Interpretation:  Normal sinus rhythm Right bundle branch block Left anterior fascicular block Left ventricular hypertrophy Cannot rule out Septal infarct , age undetermined Possible Lateral infarct , age undetermined Interpretation limited secondary to artifact Abnormal ECG Confirmed by Fredia Sorrow [phone]) on 02/25/2018 3:38:58 PM   Radiology Ct Abdomen Pelvis Wo Contrast  Result Date: 02/25/2018 CLINICAL DATA:  Fatigue with weakness and loss of appetite EXAM: CT ABDOMEN AND PELVIS WITHOUT CONTRAST TECHNIQUE: Multidetector CT imaging of the abdomen and pelvis was performed following the standard protocol without IV contrast. COMPARISON:   CT 11/24/2017, 12/01/2014 FINDINGS: Lower chest: Lung bases demonstrate no acute consolidation or pleural effusion. Trace  pericardial effusion. Borderline heart size. Hepatobiliary: Calcified granuloma in the liver. Multiple calcified stones in the gallbladder. Slightly enlarged extrahepatic common bowel duct measuring up to 9 mm. 9 mm stone in the distal common duct as before. Pancreas: Unremarkable. No pancreatic ductal dilatation or surrounding inflammatory changes. Spleen: Normal in size without focal abnormality. Adrenals/Urinary Tract: Adrenal glands are within normal limits. Hypodense lesions upper pole left kidney. Dilated extrarenal pelvis bilaterally. No ureteral stones. Slightly thick-walled bladder Stomach/Bowel: Stomach is within normal limits. Appendix appears normal. No evidence of bowel wall thickening, distention, or inflammatory changes. Sigmoid colon diverticular disease without acute inflammation Vascular/Lymphatic: Moderate aortic atherosclerosis. No aneurysmal dilatation. No significantly enlarged lymph nodes. Reproductive: Prostate calcification Other: Negative for free air or free fluid. Musculoskeletal: Degenerative changes with ankylosis at L4 and L5. IMPRESSION: 1. Multiple calcified gallstones. Linear soft tissue density at the right abdominal skin surface coursing toward the fundus of the gallbladder, felt related to prior cholecystostomy tube placement. 9 mm stone in the distal common bile duct with mild extrahepatic biliary dilatation and slight central intra hepatic biliary dilatation. 2. Similar appearance of dilated extrarenal pelvises but without ureteral stone. Slightly thick-walled appearance of the bladder 3. Sigmoid colon diverticular disease without acute inflammation. Electronically Signed   By: Donavan Foil M.D.   On: 02/25/2018 19:31   Dg Chest 2 View  Result Date: 02/25/2018 CLINICAL DATA:  Weakness. EXAM: CHEST - 2 VIEW COMPARISON:  11/24/2017 FINDINGS: Lungs are  adequately inflated without focal consolidation or effusion. Borderline stable cardiomegaly. Mild calcified plaque over the aortic arch. Minimal degenerate change of the spine. IMPRESSION: No active cardiopulmonary disease. Electronically Signed   By: Marin Olp M.D.   On: 02/25/2018 19:34   Ct Head Wo Contrast  Result Date: 02/25/2018 CLINICAL DATA:  Fatigue and generalized weakness as well as decreased appetite 2 days. Recent fall. EXAM: CT HEAD WITHOUT CONTRAST TECHNIQUE: Contiguous axial images were obtained from the base of the skull through the vertex without intravenous contrast. COMPARISON:  07/28/2016 FINDINGS: Brain: Ventricles, cisterns and other CSF spaces are within normal as there is mild age related atrophic change. There is moderate chronic ischemic microvascular disease. Small old left frontal infarct. No mass, mass effect, shift of midline structures or acute hemorrhage. No evidence of acute infarction. Basal ganglia calcifications are present. Vascular: No hyperdense vessel or unexpected calcification. Skull: Normal. Negative for fracture or focal lesion. Sinuses/Orbits: Orbits are within normal. Paranasal sinuses are clear. Mastoid air cells are clear. Other: None. IMPRESSION: No acute findings. Moderate chronic ischemic microvascular disease. Mild age related atrophic change. Small old left frontal infarct. Electronically Signed   By: Marin Olp M.D.   On: 02/25/2018 19:25    Procedures Procedures (including critical care time)  Medications Ordered in ED Medications  0.9 %  sodium chloride infusion ( Intravenous New Bag/Given 02/25/18 1747)  ondansetron (ZOFRAN) injection 4 mg (4 mg Intravenous Not Given 02/25/18 1745)  levofloxacin (LEVAQUIN) IVPB 750 mg (has no administration in time range)     Initial Impression / Assessment and Plan / ED Course  I have reviewed the triage vital signs and the nursing notes.  Pertinent labs & imaging results that were available during  my care of the patient were reviewed by me and considered in my medical decision making (see chart for details).     Patient with some failure to thrive at home and getting weak in 2 weeks for his elderly wife to take care of him.  Extensive  workup was pursued to try to determine the cause for the weakness.  CT head negative, x-ray of his chest was negative for pneumonia.  CT abdomen showed evidence of a large common bile duct stone.  Liver function tests showed biliary obstructive type lab pattern.  Lipase was normal.  No evidence of acute cholecystitis.  No leukocytosis.  Troponin was slightly elevated but review of his previous troponin show that they are always in that range feel that this is secondary to his renal failure.  Renal failure is at baseline.  Discussed with on-call gastroenterology Dr. Melony Overly.  He plans to place a stent through ERCP tomorrow.  Patient will be admitted by the hospitalist.  Hospitalist aware that care management he gets involved about his home situation.  Patient started on the antibiotic Levaquin because he has allergy to penicillin degree of the severity is not clear at this time Levaquin would be appropriate for biliary inflammation and cholangitis.  Dr. Laural Golden was concerned it could be some subclinical inflammation of the gallbladder next the reason for the antibiotics.  Final Clinical Impressions(s) / ED Diagnoses   Final diagnoses:  Choledocholithiasis  Weakness    ED Discharge Orders    None       Fredia Sorrow, MD 02/25/18 2350

## 2018-02-25 NOTE — H&P (Signed)
History and Physical    Chase [name]:[id number] DOB: 1931/09/11 DOA: 02/25/2018  PCP: [name], MD   Patient coming from: Home  Chief Complaint: Generalized weakness, abd pain   HPI: Chase [name] is a 84 y.o. male with medical history significant for history of CVA, chronic kidney disease stage IV, hypothyroidism, seizure disorder, hypertension, and insulin-dependent diabetes mellitus, now presenting to the emergency department for evaluation of generalized weakness and abdominal pain.  Patient is accompanied by family who provide most of the history.  He has reportedly been developing progressive generalized weakness and fatigue over the past several days, manifest with difficulty using his walker unassisted.  Patient had been complaining of pain in the abdomen and vomited once yesterday.    He has a relevant history of acute cholecystitis treated in 2015 with percutaneous drainage.  ED Course: Upon arrival to the ED, patient is found to be afebrile, saturating well on room air, and with vitals otherwise normal.  EKG features a sinus rhythm with RBBB, LAFB, and LVH.  Chest x-ray is negative for acute cardiopulmonary disease.  Noncontrast head CT is negative for acute intracranial abnormality.  Chemistry panel is notable for alkaline phosphatase 341, AST 382, ALT 492, total bilirubin 1.8, and creatinine of 3.54, consistent with his apparent baseline.  CBC is notable for stable chronic normocytic anemia with hemoglobin of 10.8.  Troponin is elevated to 0.06 without chest pain and urinalysis is unremarkable.  CT of the abdomen and pelvis reveals a 9 mm stone in the distal CBD with mild extrahepatic dilation.  Gastroenterology was consulted by the ED physician and recommends a medical admission with empiric antibiotics and likely ERCP and stent in the morning.  Patient remains hemodynamically stable, in no apparent respiratory distress, and will be admitted to the telemetry unit for  ongoing evaluation and management of choledocholithiasis.  Review of Systems:  All other systems reviewed and apart from HPI, are negative.  Past Medical History:  Diagnosis Date  . Bradycardia 03/15/2012  . Carotid artery occlusion   . CKD (chronic kidney disease) stage 4, GFR 15-29 ml/min (HCC)   . Complete lesion of cervical spinal cord ([place]) 03/14/3012   Stable since 2006  . CVA (cerebrovascular accident) ([place]) 09/07/12  . Depressive disorder, not elsewhere classified   . Diabetes mellitus approx 1994  . Diabetic neuropathy ([place])   . Hypertensive heart disease   . Hypothyroidism approx 2000  . Lacunar stroke, acute ([place]) 03/14/2012  . Obesity   . Osteoarthrosis, unspecified whether generalized or localized, lower leg   . Other and unspecified hyperlipidemia   . Peripheral vascular disease, unspecified ([place])   . Seizures ([place])   . Spinal stenosis, unspecified region other than cervical   . Spondylosis of unspecified site without mention of myelopathy     Past Surgical History:  Procedure Laterality Date  . COLONOSCOPY N/A 08/10/2013   Procedure: COLONOSCOPY;  Surgeon: Rogene Houston, MD;  Location: [facility];  Service: Endoscopy;  Laterality: N/A;  240  . kidney stones left x2  1975  . KIDNEY SURGERY     Ruptured left kidney 30 yrs ago  from a kidney stone     reports that he has never smoked. He has never used smokeless tobacco. He reports that he does not drink alcohol or use drugs.  Allergies  Allergen Reactions  . Bayer Advanced Aspirin [Aspirin] Nausea And Vomiting  . Penicillins Nausea And Vomiting    Family History  Problem  Relation Age of Onset  . Diabetes Mother   . Prostate cancer Father   . Hypertension Brother   . Hypertension Brother   . Diabetes Brother   . Stroke Brother   . Diabetes Brother   . Diabetes Daughter   . Diabetes Daughter      Prior to Admission medications   Medication Sig Start Date End Date Taking? Authorizing Provider    amLODipine (NORVASC) 10 MG tablet TAKE 1 TABLET BY MOUTH ONCE DAILY 01/03/18   [name], MD  aspirin EC 81 MG tablet Take 1 tablet (81 mg total) by mouth daily. 06/16/13   [name], MD  blood glucose meter kit and supplies KIT Dispense based on patient and insurance preference. Use up to two times daily as directed. (FOR ICD 10 E11.65) 01/08/17   Cassandria Anger, MD  chlorthalidone (HYGROTON) 50 MG tablet TAKE 1 TABLET BY MOUTH ONCE DAILY 01/03/18   [name], MD  clopidogrel (PLAVIX) 75 MG tablet TAKE 1 TABLET BY MOUTH ONCE DAILY 12/27/17   [name], MD  glucose blood (ONE TOUCH ULTRA TEST) test strip USE 1 STRIP TO CHECK GLUCOSE UP TO TWICE DAILY E11.65 09/14/17   Cassandria Anger, MD  glucose blood test strip 1 each by Other route 2 (two) times daily. Use as instructed bid E11.65    [provider]  Insulin Isophane & Regular Human (NOVOLIN 70/30 FLEXPEN RELION) (70-30) 100 UNIT/ML PEN Inject 10 Units into the skin 2 (two) times daily before a meal. 11/25/17   Nida, Marella Chimes, MD  levETIRAcetam (KEPPRA) 500 MG tablet Take 250 mg by mouth at bedtime.     [provider]  levothyroxine (SYNTHROID, LEVOTHROID) 137 MCG tablet Take 1 tablet (137 mcg total) by mouth every morning. 02/16/18   Cassandria Anger, MD  Multiple Vitamins-Minerals (EYE VITAMINS PO) Take 1 tablet by mouth daily.     [provider]  olopatadine (PATANOL) 0.1 % ophthalmic solution Place 1 drop into both eyes 2 (two) times daily. 01/25/17   Raylene Everts, MD  ONE TOUCH ULTRA TEST test strip USE 1 STRIP TO CHECK GLUCOSE TWICE DAILY 12/08/17   Cassandria Anger, MD  [facility] DELICA LANCETS [address] [place]  01/14/17   [provider]  tamsulosin (FLOMAX) 0.4 MG CAPS capsule Take 0.4 mg by mouth.    [provider]  Vitamin D, Ergocalciferol, (DRISDOL) 50000 units CAPS capsule TAKE 1 CAPSULE BY MOUTH ONCE A WEEK 10/04/17   [name], MD    Physical Exam: Vitals:   02/25/18 2030 02/25/18 2100 02/25/18 2130 02/25/18 2200  BP: (!) 153/68 (!) 156/71 (!) 150/69 (!) 149/95  Pulse: 78 72 73 77  Resp: '18 18 18 16  ' Temp:      TempSrc:      SpO2: 100% 100% 100% 99%  Weight:      Height:          Constitutional: NAD, calm  Eyes: PERTLA, lids and conjunctivae normal ENMT: Mucous membranes are moist. Posterior pharynx clear of any exudate or lesions.   Neck: normal, supple, no masses, no thyromegaly Respiratory: clear to auscultation bilaterally, no wheezing, no crackles. Normal respiratory effort.    Cardiovascular: S1 & S2 heard, regular rate and rhythm. No significant JVD. Abdomen: No distension, soft, mild RUQ and epigastric tenderness. Bowel sounds active.  Musculoskeletal: no clubbing / cyanosis. No joint deformity upper and lower extremities.    Skin: no  significant rashes, lesions, ulcers. Warm, dry, well-perfused.  Neurologic: No gross facial asymmetry. Sensation intact. Moving all extremities. Generally weak and lethargic. Psychiatric:  Lethargic, oriented to person and place. Pleasant and cooperative.     Labs on Admission: I have personally reviewed following labs and imaging studies  CBC: Recent Labs  Lab 02/25/18 1636  WBC 8.0  HGB 10.8*  HCT 33.9*  MCV 96.3  PLT 825   Basic Metabolic Panel: Recent Labs  Lab 02/25/18 1735  NA 138  K 3.6  CL 107  CO2 20*  GLUCOSE 152*  BUN 52*  CREATININE 3.54*  CALCIUM 9.3   GFR: Estimated Creatinine Clearance: 15 mL/min (A) (by C-G formula based on SCr of 3.54 mg/dL (H)). Liver Function Tests: Recent Labs  Lab 02/25/18 1743  AST 382*  ALT 492*  ALKPHOS 341*  BILITOT 1.8*  PROT 6.9  ALBUMIN 3.3*   Recent Labs  Lab 02/25/18 1743  LIPASE 25   No results for input(s): AMMONIA in the last 168 hours. Coagulation Profile: No results for input(s): INR, PROTIME in the last 168 hours. Cardiac Enzymes: Recent Labs  Lab  02/25/18 1743 02/25/18 1934  CKTOTAL  --  60  TROPONINI 0.06*  --    BNP (last 3 results) No results for input(s): PROBNP in the last 8760 hours. HbA1C: No results for input(s): HGBA1C in the last 72 hours. CBG: No results for input(s): GLUCAP in the last 168 hours. Lipid Profile: No results for input(s): CHOL, HDL, LDLCALC, TRIG, CHOLHDL, LDLDIRECT in the last 72 hours. Thyroid Function Tests: No results for input(s): TSH, T4TOTAL, FREET4, T3FREE, THYROIDAB in the last 72 hours. Anemia Panel: No results for input(s): VITAMINB12, FOLATE, FERRITIN, TIBC, IRON, RETICCTPCT in the last 72 hours. Urine analysis:    Component Value Date/Time   COLORURINE YELLOW 02/25/2018 2000   APPEARANCEUR CLEAR 02/25/2018 2000   LABSPEC 1.014 02/25/2018 2000   PHURINE 5.0 02/25/2018 2000   GLUCOSEU NEGATIVE 02/25/2018 2000   HGBUR SMALL (A) 02/25/2018 2000   BILIRUBINUR NEGATIVE 02/25/2018 2000   KETONESUR NEGATIVE 02/25/2018 2000   PROTEINUR 30 (A) 02/25/2018 2000   UROBILINOGEN 0.2 11/30/2014 2359   NITRITE NEGATIVE 02/25/2018 2000   LEUKOCYTESUR NEGATIVE 02/25/2018 2000   Sepsis Labs: '@LABRCNTIP' (procalcitonin:4,lacticidven:4) )No results found for this or any previous visit (from the past 240 hour(s)).   Radiological Exams on Admission: Ct Abdomen Pelvis Wo Contrast  Result Date: 02/25/2018 CLINICAL DATA:  Fatigue with weakness and loss of appetite EXAM: CT ABDOMEN AND PELVIS WITHOUT CONTRAST TECHNIQUE: Multidetector CT imaging of the abdomen and pelvis was performed following the standard protocol without IV contrast. COMPARISON:  CT 11/24/2017, 12/01/2014 FINDINGS: Lower chest: Lung bases demonstrate no acute consolidation or pleural effusion. Trace pericardial effusion. Borderline heart size. Hepatobiliary: Calcified granuloma in the liver. Multiple calcified stones in the gallbladder. Slightly enlarged extrahepatic common bowel duct measuring up to 9 mm. 9 mm stone in the distal common  duct as before. Pancreas: Unremarkable. No pancreatic ductal dilatation or surrounding inflammatory changes. Spleen: Normal in size without focal abnormality. Adrenals/Urinary Tract: Adrenal glands are within normal limits. Hypodense lesions upper pole left kidney. Dilated extrarenal pelvis bilaterally. No ureteral stones. Slightly thick-walled bladder Stomach/Bowel: Stomach is within normal limits. Appendix appears normal. No evidence of bowel wall thickening, distention, or inflammatory changes. Sigmoid colon diverticular disease without acute inflammation Vascular/Lymphatic: Moderate aortic atherosclerosis. No aneurysmal dilatation. No significantly enlarged lymph nodes. Reproductive: Prostate calcification Other: Negative for free air or free fluid. Musculoskeletal:  Degenerative changes with ankylosis at L4 and L5. IMPRESSION: 1. Multiple calcified gallstones. Linear soft tissue density at the right abdominal skin surface coursing toward the fundus of the gallbladder, felt related to prior cholecystostomy tube placement. 9 mm stone in the distal common bile duct with mild extrahepatic biliary dilatation and slight central intra hepatic biliary dilatation. 2. Similar appearance of dilated extrarenal pelvises but without ureteral stone. Slightly thick-walled appearance of the bladder 3. Sigmoid colon diverticular disease without acute inflammation. Electronically Signed   By: Donavan Foil M.D.   On: 02/25/2018 19:31   Dg Chest 2 View  Result Date: 02/25/2018 CLINICAL DATA:  Weakness. EXAM: CHEST - 2 VIEW COMPARISON:  11/24/2017 FINDINGS: Lungs are adequately inflated without focal consolidation or effusion. Borderline stable cardiomegaly. Mild calcified plaque over the aortic arch. Minimal degenerate change of the spine. IMPRESSION: No active cardiopulmonary disease. Electronically Signed   By: Marin Olp M.D.   On: 02/25/2018 19:34   Ct Head Wo Contrast  Result Date: 02/25/2018 CLINICAL DATA:  Fatigue  and generalized weakness as well as decreased appetite 2 days. Recent fall. EXAM: CT HEAD WITHOUT CONTRAST TECHNIQUE: Contiguous axial images were obtained from the base of the skull through the vertex without intravenous contrast. COMPARISON:  07/28/2016 FINDINGS: Brain: Ventricles, cisterns and other CSF spaces are within normal as there is mild age related atrophic change. There is moderate chronic ischemic microvascular disease. Small old left frontal infarct. No mass, mass effect, shift of midline structures or acute hemorrhage. No evidence of acute infarction. Basal ganglia calcifications are present. Vascular: No hyperdense vessel or unexpected calcification. Skull: Normal. Negative for fracture or focal lesion. Sinuses/Orbits: Orbits are within normal. Paranasal sinuses are clear. Mastoid air cells are clear. Other: None. IMPRESSION: No acute findings. Moderate chronic ischemic microvascular disease. Mild age related atrophic change. Small old left frontal infarct. Electronically Signed   By: Marin Olp M.D.   On: 02/25/2018 19:25    EKG: Independently reviewed. Sinus rhythm, RBBB, LAFB, LVH.   Assessment/Plan  1. Choledocholithiasis  - Presents with generalized weakness and malaise, abdominal pain  - Found to have new elevations in LFTs and choledocholithiasis on CT  - GI is consulting and much appreciated  - Started on empiric abx with Levaquin in ED, will add Flagyl  - Continue IVF hydration, bowel-rest, prn antiemetics and analgesia, empiric abx   2. Elevated troponin - Troponin elevated to 0.06 in ED without angina  - Continue cardiac monitoring, trend troponin, continue ASA and Plavix    3. CKD stage IV - SCr is 3.54 on admission, consistent with his apparent baseline  - Renally-dose medications, avoid nephrotoxins    4. Insulin-dependent DM  - A1c was 6.7% earlier this month  - Managed at home with Novolog 70/30 10 units BID   - Follow CBGs and use Novolog per sliding-scale  while in hospital   5. Hypertension  - BP at goal  - Hold chlorthalidone while NPO for procedure, continue Norvasc    6. Hypothyroidism  - Continue Synthroid   7. Hx of CVA  - Family reports recent generalized weakness and increased difficulty using his walker on his own  - No focal deficits identified, head CT negative for acute findings  - Continue statin, ASA, Plavix    8. Seizure disorder  - Continue Keppra   9. Generalized weakness - Family raises concern for recent generalized weakness, unable to use walker unassisted now  - No focal deficits identified and CT head  negative for acute findings  - Possibly secondary to acute illness with choledocholithiasis  - Check TSH given hx of hypothyroidism  - PT eval requested    DVT prophylaxis: SCD's  Code Status: Full  Family Communication: wife and daughter updated at bedside Consults called: Gastroenterology  Admission status: Inpatient    Vianne Bulls, MD [facility] Pager [phone]  If 7PM-7AM, please contact night-coverage [email or web address] Password [facility]  02/25/2018, 11:43 PM

## 2018-02-25 NOTE — ED Notes (Addendum)
Date and time results received: 02/25/18 1864   Test: Troponin Critical Value: 0.06  Name of Provider Notified: Dr. Rogene Houston  Orders Received? Or Actions Taken?: None given

## 2018-02-25 NOTE — ED Triage Notes (Signed)
Patient c/o fatigue with generalized weakness and loss appetite x2 days. Patient lost balance yesterday while walking with walker and slide down to floor on his buttock per wife. Denies hitting head. Wife states patient vomited yesterday per wife. Denies any diarrhea. Patient does report nonproductive cough. Denies any pain anywhere at this time. Family member states that patient was c/o chest pain and upper back pain earlier this week but pain subsided. Patient also had some blistering and pain around old incision in right upper abd.

## 2018-02-25 NOTE — ED Notes (Signed)
Patient resting comfortably.  Advised family members that we needed urine specimen.  Urinal at bedside.

## 2018-02-25 NOTE — ED Notes (Signed)
Patient used urinal, but not enough for urine specimen.  Urinal in place and family members are encouraging him to keep attempting to provide urine specimen.

## 2018-02-26 ENCOUNTER — Other Ambulatory Visit: Payer: Self-pay

## 2018-02-26 DIAGNOSIS — K8050 Calculus of bile duct without cholangitis or cholecystitis without obstruction: Secondary | ICD-10-CM

## 2018-02-26 DIAGNOSIS — R932 Abnormal findings on diagnostic imaging of liver and biliary tract: Secondary | ICD-10-CM

## 2018-02-26 LAB — COMPREHENSIVE METABOLIC PANEL
ALT: 380 U/L — ABNORMAL HIGH (ref 17–63)
AST: 202 U/L — ABNORMAL HIGH (ref 15–41)
Albumin: 3 g/dL — ABNORMAL LOW (ref 3.5–5.0)
Alkaline Phosphatase: 311 U/L — ABNORMAL HIGH (ref 38–126)
Anion gap: 11 (ref 5–15)
BUN: 48 mg/dL — ABNORMAL HIGH (ref 6–20)
CO2: 18 mmol/L — ABNORMAL LOW (ref 22–32)
Calcium: 8.9 mg/dL (ref 8.9–10.3)
Chloride: 110 mmol/L (ref 101–111)
Creatinine, Ser: 3.08 mg/dL — ABNORMAL HIGH (ref 0.61–1.24)
GFR calc Af Amer: 20 mL/min — ABNORMAL LOW (ref >60)
GFR calc non Af Amer: 17 mL/min — ABNORMAL LOW (ref >60)
Glucose, Bld: 97 mg/dL (ref 65–99)
Potassium: 3.9 mmol/L (ref 3.5–5.1)
Sodium: 139 mmol/L (ref 135–145)
Total Bilirubin: 1.1 mg/dL (ref 0.3–1.2)
Total Protein: 6.5 g/dL (ref 6.5–8.1)

## 2018-02-26 LAB — CBC WITH DIFFERENTIAL/PLATELET
Basophils Absolute: 0 10*3/uL (ref 0.0–0.1)
Basophils Relative: 0 %
Eosinophils Absolute: 0.3 10*3/uL (ref 0.0–0.7)
Eosinophils Relative: 5 %
HCT: 29.5 % — ABNORMAL LOW (ref 39.0–52.0)
Hemoglobin: 9.4 g/dL — ABNORMAL LOW (ref 13.0–17.0)
Lymphocytes Relative: 12 %
Lymphs Abs: 0.7 10*3/uL (ref 0.7–4.0)
MCH: 30.8 pg (ref 26.0–34.0)
MCHC: 31.9 g/dL (ref 30.0–36.0)
MCV: 96.7 fL (ref 78.0–100.0)
Monocytes Absolute: 1 10*3/uL (ref 0.1–1.0)
Monocytes Relative: 16 %
Neutro Abs: 4.1 10*3/uL (ref 1.7–7.7)
Neutrophils Relative %: 67 %
Platelets: 210 10*3/uL (ref 150–400)
RBC: 3.05 MIL/uL — ABNORMAL LOW (ref 4.22–5.81)
RDW: 13.2 % (ref 11.5–15.5)
WBC: 6 10*3/uL (ref 4.0–10.5)

## 2018-02-26 LAB — TROPONIN I
Troponin I: 0.05 ng/mL (ref <0.03)
Troponin I: 0.06 ng/mL (ref <0.03)
Troponin I: 0.06 ng/mL (ref <0.03)

## 2018-02-26 LAB — GLUCOSE, CAPILLARY
Glucose-Capillary: 99 mg/dL (ref 65–99)
Glucose-Capillary: 89 mg/dL (ref 65–99)
Glucose-Capillary: 213 mg/dL — ABNORMAL HIGH (ref 65–99)
Glucose-Capillary: 227 mg/dL — ABNORMAL HIGH (ref 65–99)
Glucose-Capillary: 62 mg/dL — ABNORMAL LOW (ref 65–99)
Glucose-Capillary: 74 mg/dL (ref 65–99)
Glucose-Capillary: 178 mg/dL — ABNORMAL HIGH (ref 65–99)

## 2018-02-26 LAB — TSH: TSH: 0.057 u[IU]/mL — ABNORMAL LOW (ref 0.350–4.500)

## 2018-02-26 IMAGING — CT CT HEAD W/O CM
4 series · 17 of 47 positions shown, 19 images · non-contrast
Comparison: 02/02/2013

CLINICAL DATA: Seizure.

EXAM:
CT HEAD WITHOUT CONTRAST
TECHNIQUE: Contiguous axial images were obtained from the base of the skull
through the vertex without intravenous contrast.

[Series 2: head w/o · axial · non-contrast · 0.44mm/px · z∈[+66,+178]mm · 8 of 38 slices shown, 10 images]
[im 5/38  brain]
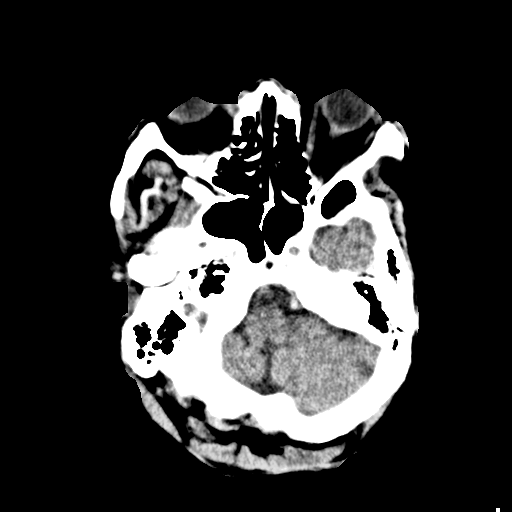
[im 5/38  bone]
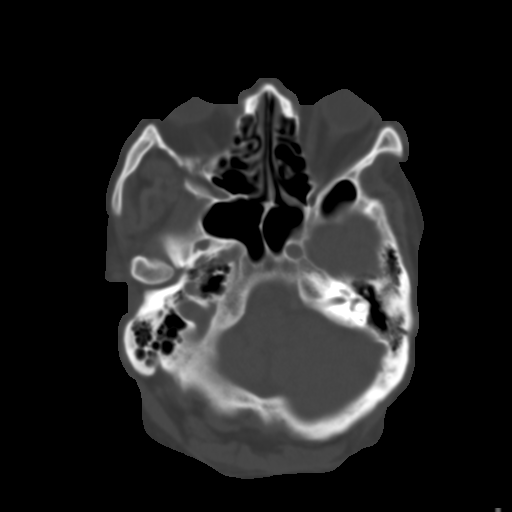
[im 9/38  brain]
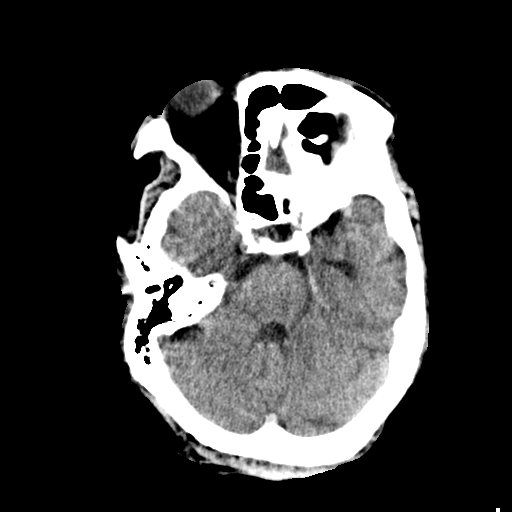
[im 13/38  brain]
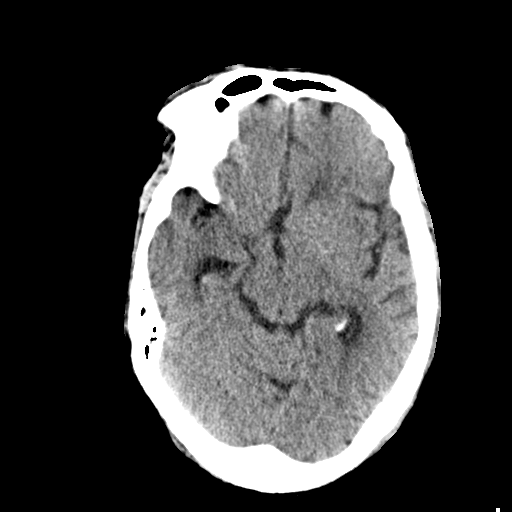
[im 17/38  brain]
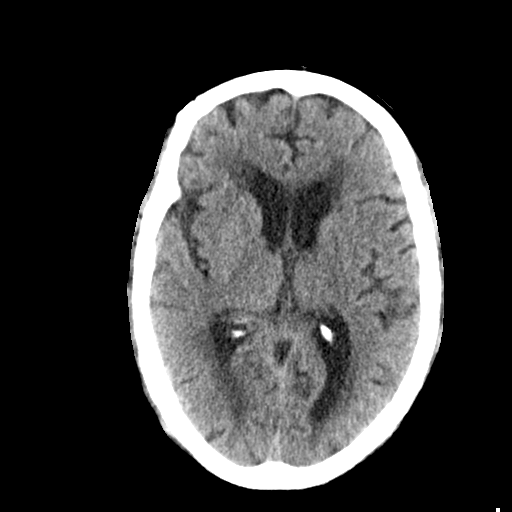
[im 21/38  brain]
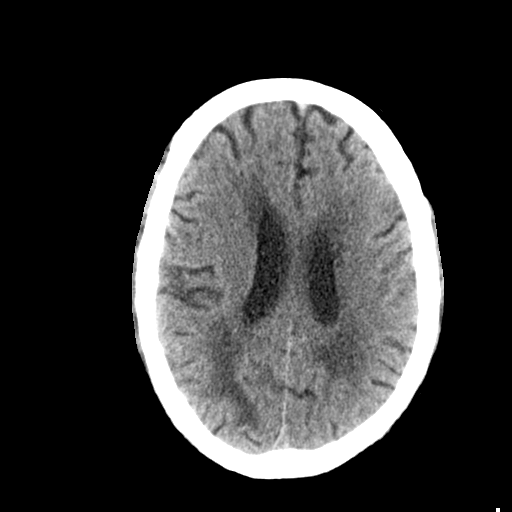
[im 21/38  bone]
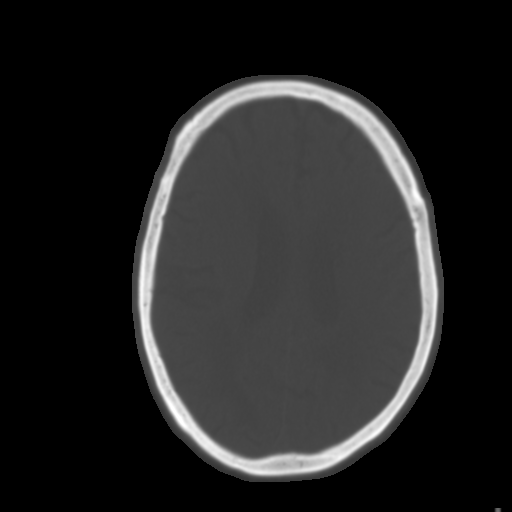
[im 25/38  brain]
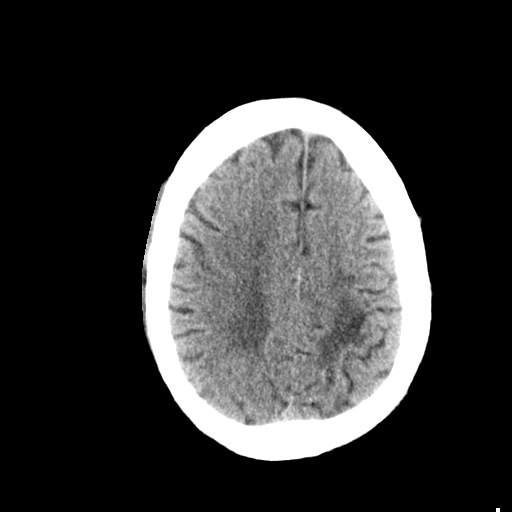
[im 29/38  brain]
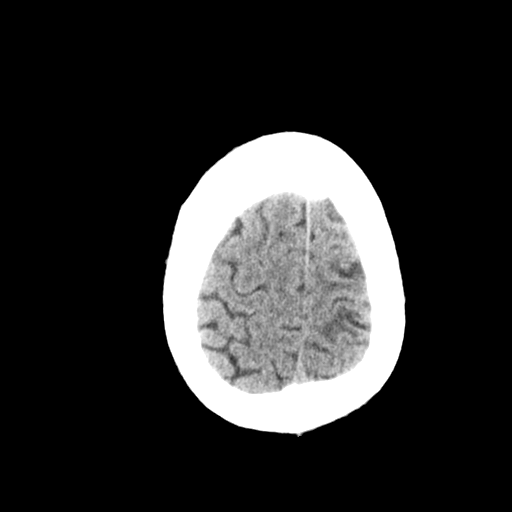
[im 33/38  brain]
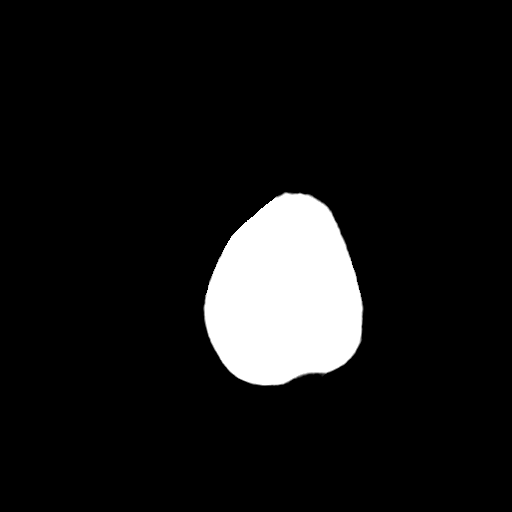

[Series 3: head bone · axial · 0.44mm/px · z∈[+64,+96]mm · 3 of 76 slices shown]
[im 8/76  bone]
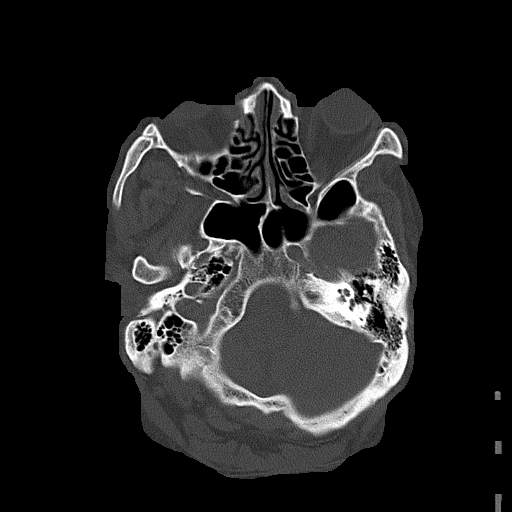
[im 16/76  bone]
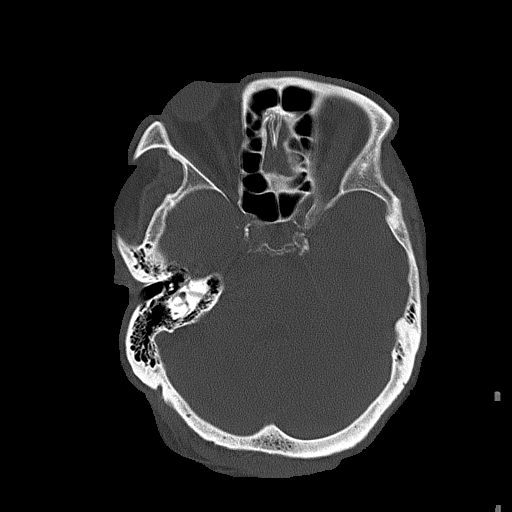
[im 24/76  bone]
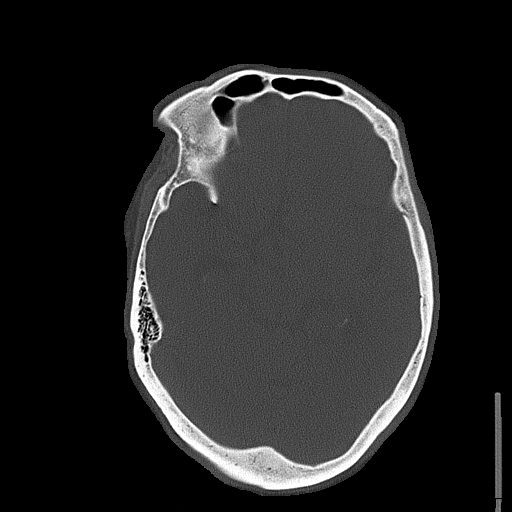

[Series 4: coronal · coronal · 0.31mm/px · 3 of 67 slices shown]
[im 23/67  brain]
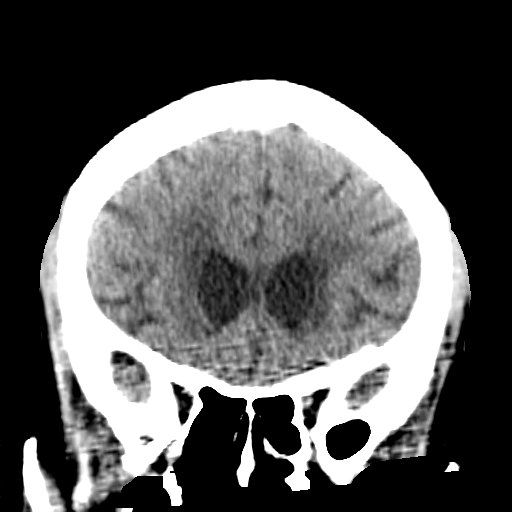
[im 30/67  brain]
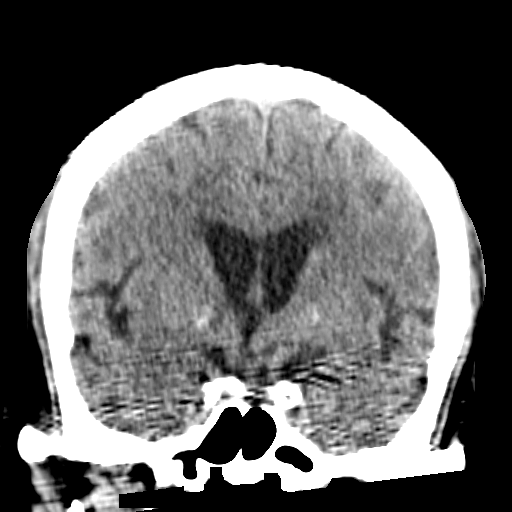
[im 37/67  brain]
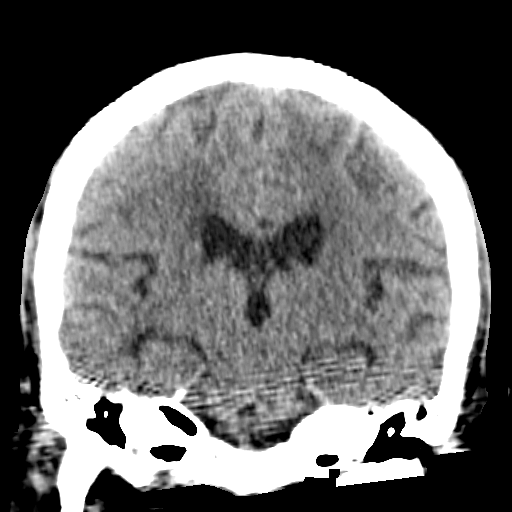

[Series 5: sagittal · sagittal · 0.33mm/px · 3 of 53 slices shown]
[im 18/53  brain]
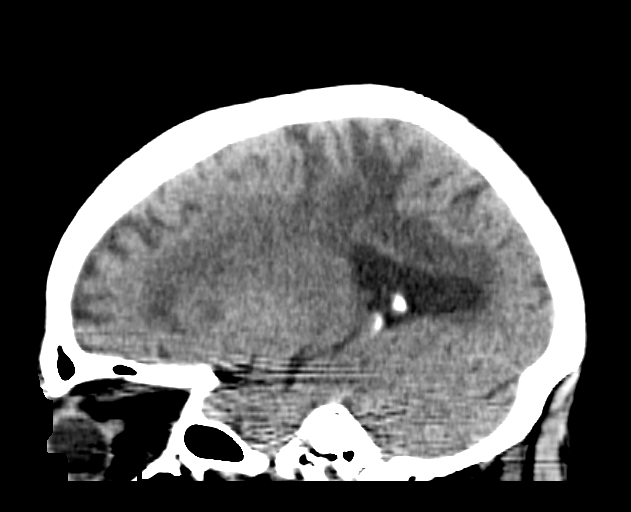
[im 27/53  brain]
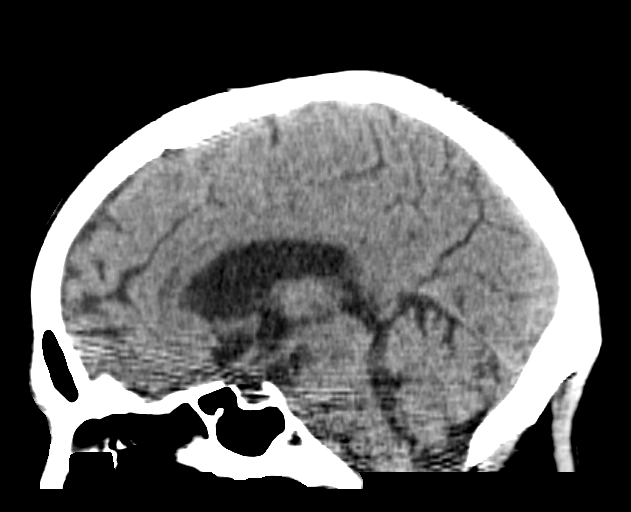
[im 35/53  brain]
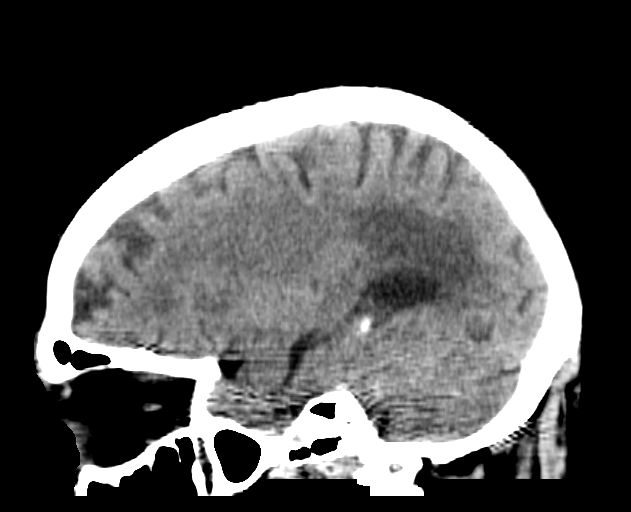

[17 of 47 positions shown; findings below may reference images not displayed]

FINDINGS: Brain: No evidence of acute infarction, hemorrhage, hydrocephalus,
extra-axial collection or mass lesion/mass effect.

Chronic cortically based gliosis in the high left frontal parietal
region from remote infarct. Remote lacunar infarct in the genu of
the right internal capsule. Interval but chronic infarct near the
left caudate head. Chronic microvascular disease in the cerebral
white matter with confluent parietal gliosis. Stable normal cerebral
volume for age.

Vascular: No hyperdense vessel.  Atherosclerotic calcification.

Skull: Negative

Sinuses/Orbits: Negative
IMPRESSION: 1. No acute finding.
2. Remote high left cortical infarcts. No other cortical finding to
explain seizure.
3. Chronic microvascular disease with progression in the left basal
ganglia region when compared to 2936.

## 2018-02-26 MED ORDER — INSULIN ASPART 100 UNIT/ML ~~LOC~~ SOLN
0.0000 [IU] | Freq: Three times a day (TID) | SUBCUTANEOUS | Status: DC
Start: 2018-02-26 — End: 2018-03-03
  Administered 2018-02-27 – 2018-03-01 (×3): 3 [IU] via SUBCUTANEOUS
  Administered 2018-03-02: 2 [IU] via SUBCUTANEOUS
  Administered 2018-03-03 (×2): 3 [IU] via SUBCUTANEOUS

## 2018-02-26 MED ORDER — SODIUM CHLORIDE 0.45 % IV SOLN
INTRAVENOUS | Status: DC
  Administered 2018-02-26 – 2018-03-01 (×4): via INTRAVENOUS

## 2018-02-26 MED ORDER — INSULIN ASPART 100 UNIT/ML ~~LOC~~ SOLN: 0.0000 [IU] | Freq: Every day | SUBCUTANEOUS | Status: DC

## 2018-02-26 MED ORDER — LEVOFLOXACIN IN D5W 750 MG/150ML IV SOLN
750.0000 mg | INTRAVENOUS | Status: DC
  Administered 2018-02-27 – 2018-03-01 (×2): 750 mg via INTRAVENOUS
  Filled 2018-02-26 (×3): qty 150

## 2018-02-26 NOTE — Progress Notes (Signed)
CRITICAL VALUE ALERT  Critical Value:  Troponin 0.05  Date & Time Notied:  02/26/2018 1326  Provider Notified: Dr. Jerilee Hoh  Orders Received/Actions taken: Notified via text page.

## 2018-02-26 NOTE — Consult Note (Signed)
Referring Provider: Kathie Dike, MD Primary Care Physician:  [name], MD Primary Gastroenterologist:  Dr. Laural Golden  Reason for Consultation:    Choledocholithiasis.  HPI:   History is obtained from patient's wife and 3 daughters were at bedside as well as from the patient.  Patient is 84 year old Afro-American male with multiple medical problems with limited physical activity who was brought to emergency room by his family members yesterday afternoon because of poor appetite over the last few days progressive weakness and fatigue.  He also had single episode of nausea and vomiting about 3 days ago. Patient had cholecystostomy back in December 2015 and tube was subsequently removed.  According to his wife he has had intermittent drainage which is increased over the last week or so.  Drainage generally consists of clear or bilious fluid and at times is blood-tinged.  She has not is noted to be mucopurulent. Patient has some discomfort at the site of cholecystostomy tube placement he denies epigastric pain. According to his family members he ambulates some at home with walker.  He spends most of his day sitting in a chair or sofa. There is no history of fever or chills but he has been feeling cold all the time.  He denies melena or rectal bleeding. He has been having swelling of his left eyelid following a fall.  He has been using topical agent which has helped.  Both parents are disease.  Mother was diabetic and lived to be 69 and father dementia and lived to be 48.  He is retired from [employer] where he worked for 31 years.  He retired in 1999.  He has never smoked cigarettes and does not drink alcohol.  He and his wife live in [place].  They have 4 daughters and 2 sons.   Past Medical History:  Diagnosis Date  . Bradycardia 03/15/2012  . Carotid artery occlusion   . CKD (chronic kidney disease) stage 4, GFR 15-29 ml/min (HCC)   . Complete lesion of cervical spinal  cord ([place]) 03/14/3012   Stable since 2006  . CVA (cerebrovascular accident) ([place]) 09/07/12  . Depressive disorder, not elsewhere classified   . Diabetes mellitus approx 1994  . Diabetic neuropathy ([place])   . Hypertensive heart disease   . Hypothyroidism approx 2000  . Lacunar stroke, acute ([place]) 03/14/2012  . Obesity   . Osteoarthrosis, unspecified whether generalized or localized, lower leg   . Other and unspecified hyperlipidemia   . Peripheral vascular disease, unspecified ([place])   . Seizures ([place])   . Spinal stenosis, unspecified region other than cervical   . Spondylosis of unspecified site without mention of myelopathy     Past Surgical History:  Procedure Laterality Date  . COLONOSCOPY N/A 08/10/2013   Procedure: COLONOSCOPY;  Surgeon: Rogene Houston, MD;  Location: [facility];  Service: Endoscopy;  Laterality: N/A;  240  . kidney stones left x2  1975  . KIDNEY SURGERY     Ruptured left kidney 30 yrs ago  from a kidney stone    Prior to Admission medications   Medication Sig Start Date End Date Taking? Authorizing Provider  amLODipine (NORVASC) 10 MG tablet TAKE 1 TABLET BY MOUTH ONCE DAILY 01/03/18  Yes [name], MD  aspirin EC 81 MG tablet Take 1 tablet (81 mg total) by mouth daily. 06/16/13  Yes [name], MD  chlorthalidone (HYGROTON) 50 MG tablet TAKE 1 TABLET BY MOUTH ONCE DAILY 01/03/18  Yes Tula Nakayama  E, MD  clobetasol cream (TEMOVATE) 0.05 %  02/17/18  Yes [provider]  clopidogrel (PLAVIX) 75 MG tablet TAKE 1 TABLET BY MOUTH ONCE DAILY 12/27/17  Yes [name], MD  desonide (DESOWEN) 0.05 % cream  02/17/18  Yes [provider]  glucose blood (ONE TOUCH ULTRA TEST) test strip USE 1 STRIP TO CHECK GLUCOSE UP TO TWICE DAILY E11.65 09/14/17  Yes Nida, Marella Chimes, MD  Insulin Isophane & Regular Human (NOVOLIN 70/30 FLEXPEN RELION) (70-30) 100 UNIT/ML PEN Inject 10 Units into the skin 2 (two) times daily before a meal.  11/25/17  Yes Nida, Marella Chimes, MD  levETIRAcetam (KEPPRA) 500 MG tablet Take 250 mg by mouth at bedtime.    Yes [provider]  levothyroxine (SYNTHROID, LEVOTHROID) 137 MCG tablet Take 1 tablet (137 mcg total) by mouth every morning. 02/16/18  Yes Nida, Marella Chimes, MD  Multiple Vitamins-Minerals (EYE VITAMINS PO) Take 1 tablet by mouth daily.    Yes [provider]  olopatadine (PATANOL) 0.1 % ophthalmic solution Place 1 drop into both eyes 2 (two) times daily. 01/25/17  Yes Raylene Everts, MD  ONE TOUCH ULTRA TEST test strip USE 1 STRIP TO CHECK GLUCOSE TWICE DAILY 12/08/17  Yes Nida, Marella Chimes, MD  [facility] DELICA LANCETS [address] [place]  01/14/17  Yes [provider]  tamsulosin (FLOMAX) 0.4 MG CAPS capsule Take 0.4 mg by mouth.   Yes [provider]  Vitamin D, Ergocalciferol, (DRISDOL) 50000 units CAPS capsule TAKE 1 CAPSULE BY MOUTH ONCE A WEEK 10/04/17  Yes [name], MD    Current Facility-Administered Medications  Medication Dose Route Frequency Provider Last Rate Last Dose  . acetaminophen (TYLENOL) tablet 650 mg  650 mg Oral Q6H PRN Opyd, Ilene Qua, MD       Or  . acetaminophen (TYLENOL) suppository 650 mg  650 mg Rectal Q6H PRN Opyd, Ilene Qua, MD      . amLODipine (NORVASC) tablet 10 mg  10 mg Oral Daily Opyd, Ilene Qua, MD      . Derrill Memo ON 02/27/2018] aspirin EC tablet 81 mg  81 mg Oral Daily Opyd, Ilene Qua, MD      . fentaNYL (SUBLIMAZE) injection 25-50 mcg  25-50 mcg Intravenous Q2H PRN Opyd, Ilene Qua, MD      . insulin aspart (novoLOG) injection 0-9 Units  0-9 Units Subcutaneous Q4H Opyd, Ilene Qua, MD      . levETIRAcetam (KEPPRA) tablet 250 mg  250 mg Oral QHS Opyd, Ilene Qua, MD   250 mg at 02/26/18 0131  . [START ON 02/27/2018] levofloxacin (LEVAQUIN) IVPB 750 mg  750 mg Intravenous Q48H Memon, Jehanzeb, MD      . levothyroxine (SYNTHROID, LEVOTHROID) tablet 137 mcg  137 mcg Oral QAC breakfast Opyd, Ilene Qua, MD       . metroNIDAZOLE (FLAGYL) IVPB 500 mg  500 mg Intravenous Q8H Opyd, Ilene Qua, MD   Stopped at 02/26/18 [phone]  . olopatadine (PATANOL) 0.1 % ophthalmic solution 1 drop  1 drop Both Eyes BID Opyd, Ilene Qua, MD      . ondansetron (ZOFRAN) injection 4 mg  4 mg Intravenous Once Opyd, Ilene Qua, MD      . ondansetron (ZOFRAN) tablet 4 mg  4 mg Oral Q6H PRN Opyd, Ilene Qua, MD       Or  . ondansetron (ZOFRAN) injection 4 mg  4 mg Intravenous Q6H PRN Opyd, Ilene Qua, MD      .  sodium chloride flush (NS) 0.9 % injection 3 mL  3 mL Intravenous Q12H Opyd, Ilene Qua, MD      . tamsulosin (FLOMAX) capsule 0.4 mg  0.4 mg Oral QPC supper Opyd, Ilene Qua, MD        Allergies as of 02/25/2018 - Review Complete 02/25/2018  Allergen Reaction Noted  . Bayer advanced aspirin [aspirin] Nausea And Vomiting 03/05/2012  . Penicillins Nausea And Vomiting     Family History  Problem Relation Age of Onset  . Diabetes Mother   . Prostate cancer Father   . Hypertension Brother   . Hypertension Brother   . Diabetes Brother   . Stroke Brother   . Diabetes Brother   . Diabetes Daughter   . Diabetes Daughter     Social History   Socioeconomic History  . Marital status: Married    Spouse name: Not on file  . Number of children: 5  . Years of education: Not on file  . Highest education level: Not on file  Occupational History  . Occupation: retired     Fish farm manager: RETIRED  Social Needs  . Financial resource strain: Not on file  . Food insecurity:    Worry: Not on file    Inability: Not on file  . Transportation needs:    Medical: Not on file    Non-medical: Not on file  Tobacco Use  . Smoking status: Never Smoker  . Smokeless tobacco: Never Used  Substance and Sexual Activity  . Alcohol use: No    Alcohol/week: 0.0 oz  . Drug use: No  . Sexual activity: Never  Lifestyle  . Physical activity:    Days per week: Not on file    Minutes per session: Not on file  . Stress: Not on file   Relationships  . Social connections:    Talks on phone: Not on file    Gets together: Not on file    Attends religious service: Not on file    Active member of club or organization: Not on file    Attends meetings of clubs or organizations: Not on file    Relationship status: Not on file  . Intimate partner violence:    Fear of current or ex partner: Not on file    Emotionally abused: Not on file    Physically abused: Not on file    Forced sexual activity: Not on file  Other Topics Concern  . Not on file  Social History Narrative  . Not on file    Review of Systems: See HPI, otherwise normal ROS  Physical Exam: Temp:  [97.9 F (36.6 C)-99.1 F (37.3 C)] 97.9 F (36.6 C) (03/23 0508) Pulse Rate:  [64-81] 73 (03/23 0508) Resp:  [15-18] 16 (03/23 0000) BP: (118-156)/(65-95) 144/65 (03/23 0508) SpO2:  [98 %-100 %] 98 % (03/23 0508) Weight:  [169 lb 5 oz (76.8 kg)-173 lb (78.5 kg)] 169 lb 5 oz (76.8 kg) (03/23 0508)   Patient is alert and in no acute distress. He has drooping of left upper eyelid which is somewhat swollen. Conjunctival is pink.  Sclerae nonicteric. Oropharyngeal mucosa is normal.  Teeth are in fair condition some of them are broken. No neck masses or thyromegaly noted. Cardiac exam with regular rhythm normal S1 and S2.  No murmur or gallop noted. Lungs are clear to auscultation. Abdomen is symmetrical.  He has dressing covering site of previous cholecystostomy tube.  Bowel sounds are normal.  On palpation abdomen is soft.  Mild tenderness noted site.  Cholecystostomy tube.  No organomegaly or masses. Edema or clubbing noted.  Lab Results: Recent Labs    02/25/18 1636 02/26/18 0614  WBC 8.0 6.0  HGB 10.8* 9.4*  HCT 33.9* 29.5*  PLT 218 210   BMET Recent Labs    02/25/18 1735 02/26/18 0614  NA 138 139  K 3.6 3.9  CL 107 110  CO2 20* 18*  GLUCOSE 152* 97  BUN 52* 48*  CREATININE 3.54* 3.08*  CALCIUM 9.3 8.9   LFT Recent Labs     02/25/18 1743 02/26/18 0614  PROT 6.9 6.5  ALBUMIN 3.3* 3.0*  AST 382* 202*  ALT 492* 380*  ALKPHOS 341* 311*  BILITOT 1.8* 1.1  BILIDIR 0.9*  --   IBILI 0.9  --     Studies/Results: Ct Abdomen Pelvis Wo Contrast  Result Date: 02/25/2018 CLINICAL DATA:  Fatigue with weakness and loss of appetite EXAM: CT ABDOMEN AND PELVIS WITHOUT CONTRAST TECHNIQUE: Multidetector CT imaging of the abdomen and pelvis was performed following the standard protocol without IV contrast. COMPARISON:  CT 11/24/2017, 12/01/2014 FINDINGS: Lower chest: Lung bases demonstrate no acute consolidation or pleural effusion. Trace pericardial effusion. Borderline heart size. Hepatobiliary: Calcified granuloma in the liver. Multiple calcified stones in the gallbladder. Slightly enlarged extrahepatic common bowel duct measuring up to 9 mm. 9 mm stone in the distal common duct as before. Pancreas: Unremarkable. No pancreatic ductal dilatation or surrounding inflammatory changes. Spleen: Normal in size without focal abnormality. Adrenals/Urinary Tract: Adrenal glands are within normal limits. Hypodense lesions upper pole left kidney. Dilated extrarenal pelvis bilaterally. No ureteral stones. Slightly thick-walled bladder Stomach/Bowel: Stomach is within normal limits. Appendix appears normal. No evidence of bowel wall thickening, distention, or inflammatory changes. Sigmoid colon diverticular disease without acute inflammation Vascular/Lymphatic: Moderate aortic atherosclerosis. No aneurysmal dilatation. No significantly enlarged lymph nodes. Reproductive: Prostate calcification Other: Negative for free air or free fluid. Musculoskeletal: Degenerative changes with ankylosis at L4 and L5. IMPRESSION: 1. Multiple calcified gallstones. Linear soft tissue density at the right abdominal skin surface coursing toward the fundus of the gallbladder, felt related to prior cholecystostomy tube placement. 9 mm stone in the distal common bile duct  with mild extrahepatic biliary dilatation and slight central intra hepatic biliary dilatation. 2. Similar appearance of dilated extrarenal pelvises but without ureteral stone. Slightly thick-walled appearance of the bladder 3. Sigmoid colon diverticular disease without acute inflammation. Electronically Signed   By: Donavan Foil M.D.   On: 02/25/2018 19:31   Dg Chest 2 View  Result Date: 02/25/2018 CLINICAL DATA:  Weakness. EXAM: CHEST - 2 VIEW COMPARISON:  11/24/2017 FINDINGS: Lungs are adequately inflated without focal consolidation or effusion. Borderline stable cardiomegaly. Mild calcified plaque over the aortic arch. Minimal degenerate change of the spine. IMPRESSION: No active cardiopulmonary disease. Electronically Signed   By: Marin Olp M.D.   On: 02/25/2018 19:34   Ct Head Wo Contrast  Result Date: 02/25/2018 CLINICAL DATA:  Fatigue and generalized weakness as well as decreased appetite 2 days. Recent fall. EXAM: CT HEAD WITHOUT CONTRAST TECHNIQUE: Contiguous axial images were obtained from the base of the skull through the vertex without intravenous contrast. COMPARISON:  07/28/2016 FINDINGS: Brain: Ventricles, cisterns and other CSF spaces are within normal as there is mild age related atrophic change. There is moderate chronic ischemic microvascular disease. Small old left frontal infarct. No mass, mass effect, shift of midline structures or acute hemorrhage. No evidence of acute infarction. Basal ganglia calcifications are  present. Vascular: No hyperdense vessel or unexpected calcification. Skull: Normal. Negative for fracture or focal lesion. Sinuses/Orbits: Orbits are within normal. Paranasal sinuses are clear. Mastoid air cells are clear. Other: None. IMPRESSION: No acute findings. Moderate chronic ischemic microvascular disease. Mild age related atrophic change. Small old left frontal infarct. Electronically Signed   By: Marin Olp M.D.   On: 02/25/2018 19:25    Abdominopelvic CT  was reviewed and head CT results acknowledged.  Assessment;  Patient is 84 year old Afro-American male with multiple medical problems including chronic kidney disease and history of CVA who was brought to emergency room for progressive weakness and failure to thrive.  On workup he was found to have elevated transaminases and CT revealed mildly dilated biliary system with 9 mm stone as well as contracted gallbladder with calcified stone. Patient underwent cholecystostomy in December 2015 for acute cholecystitis.  He was felt to be high risk and therefore not subjected to cholecystectomy.  T was subsequently removed and he still has intermittent drainage. Clinically he does not appear to have cholangitis but he may well have given his atypical presentation.  He has low-grade fever.  He is currently on Levaquin which should be sufficient. Patient may need ERCP to clear bile duct of stone/stones.  However he is on Plavix and therefore procedure cannot be performed an urgent basis.  I am afraid he will end up with cholangitis biliary pancreatitis without this intervention. Since clinically he is not septic it would be reasonable to wait a few days before ERCP considered.  He will also need to be evaluated by anesthesia. Last clopidogrel dose was yesterday.  Patient's condition discussed at length with his wife and 3 daughters were at bedside.  Mildly elevated troponin level felt to be insignificant.  Anemia appears to be due to chronic disease.  Hemoglobin has dropped some post hydration.  Will monitor.  Chronic kidney disease.   Recommendations;  Repeat lab in a.m. to include CBC INR and LFTs. Will get input from anesthesia team tomorrow. If patient remains stable he may be able to go home on p.o. antibiotic and return for ERCP next week. If he is not cleared by anesthesia he will be referred to tertiary center.   LOS: 1 day   Najeeb Rehman  02/26/2018, 1:10 PM

## 2018-02-26 NOTE — Progress Notes (Signed)
Pharmacy Antibiotic Note  Chase [name] is a 84 y.o. male admitted on 02/25/2018 with IAI.  Pharmacy has been consulted for [facility] dosing.  Plan: Levaquin 750mg  IV q48hrs (renally adjusted) Monitor labs, progress, c/s  Height: 5\' 8"  (172.7 cm) Weight: 169 lb 5 oz (76.8 kg) IBW/kg (Calculated) : 68.4  Temp (24hrs), Avg:98.6 F (37 C), Min:97.9 F (36.6 C), Max:99.1 F (37.3 C)  Recent Labs  Lab 02/25/18 1636 02/25/18 1735 02/26/18 0614  WBC 8.0  --  6.0  CREATININE  --  3.54* 3.08*    Estimated Creatinine Clearance: 17.3 mL/min (A) (by C-G formula based on SCr of 3.08 mg/dL (H)).    Allergies  Allergen Reactions  . Bayer Advanced Aspirin [Aspirin] Nausea And Vomiting  . Penicillins Nausea And Vomiting   Antimicrobials this admission: 3/22 Levaquin >>   No results found for this or any previous visit (from the past 240 hour(s)).  Thank you for allowing pharmacy to be a part of this patient's care.  Hart Robinsons A 02/26/2018 9:53 AM

## 2018-02-26 NOTE — Progress Notes (Signed)
PROGRESS NOTE    Chase [name]  JEH:[id number] DOB: [birth year] DOA: 02/25/2018 PCP: [name], MD    Brief Narrative:  84 year old male admitted to the hospital with elevated liver function tests and abdominal pain.  Found to have evidence of cholelithiasis and choledocholithiasis.  Has previous history of cholecystitis that was managed with cholecystostomy tube.  Gastroenterology following and is considering ERCP.  Currently, the patient  he is on intravenous antibiotics and is clinically stable.   Assessment & Plan:   Principal Problem:   Choledocholithiasis Active Problems:   Hypothyroidism   Uncontrolled type 2 diabetes mellitus with stage 4 chronic kidney disease, with long-term current use of insulin (HCC)   Seizure disorder, complex partial (HCC)   History of stroke   CKD (chronic kidney disease) stage 4, GFR 15-29 ml/min (HCC)   Elevated troponin   Essential hypertension, benign   1. Cholelithiasis and choledocholithiasis.  Patient presented with abdominal pain, elevated LFTs.  Imaging indicates choledocholithiasis.  Gastroenterology following.  He is been started on intravenous antibiotics with levofloxacin and metronidazole.  He will likely need ERCP for further management.  Patient is chronically on Plavix which increases risk for procedure.  Since he does not appear septic, his Plavix will be held and we will monitor the patient for now.  Continue IV antibiotics and repeat labs in the morning.  Excellent chronic kidney disease stage IV.  Creatinine is currently at baseline.  Continue to monitor. 2. Elevated troponin.  He does not have any chest pain or shortness of breath.  EKG without acute changes.  In the setting of elevated creatinine, minimal rise in troponin is not appear to be significant. 3. Diabetes.  A1c was 6.7 earlier this month.  Continue on 70/30 insulin 4. Hypertension.  Blood pressure currently stable.  Hold chlorthalidone.  Continue on  Norvasc 5. Hypothyroidism.  Continue Synthroid 6. Seizure disorder.  Continue on Keppra 7. Generalized weakness.  Possibly related to some degree of dehydration.  Physical therapy consultation requested.   DVT prophylaxis: SCDs Code Status: Full code Family Communication: Discussed with multiple family members at the bedside Disposition Plan: Pending hospital course, possible discharge home if he improves   Consultants:   Gastroenterology  Procedures:     Antimicrobials:   Levofloxacin 3/22 >  Flagyl 3/22 >   Subjective: Abdominal pain is better.  No vomiting.  Family has noticed drainage from previous cholecystostomy tube site.  This has occurred on occasion and they have followed-up with his general surgeon in the past.  Objective: Vitals:   02/26/18 0000 02/26/18 0039 02/26/18 0508 02/26/18 1500  BP: 130/87 (!) 151/82 (!) 144/65 123/65  Pulse: 72 81 73 63  Resp: 16   18  Temp:  99.1 F (37.3 C) 97.9 F (36.6 C) 98.7 F (37.1 C)  TempSrc:  Oral Axillary Oral  SpO2: 100% 100% 98% 100%  Weight:   76.8 kg (169 lb 5 oz)   Height:        Intake/Output Summary (Last 24 hours) at 02/26/2018 1857 Last data filed at 02/26/2018 1500 Gross per 24 hour  Intake 448.75 ml  Output -  Net 448.75 ml   Filed Weights   02/25/18 1528 02/26/18 0508  Weight: 78.5 kg (173 lb) 76.8 kg (169 lb 5 oz)    Examination:  General exam: Appears calm and comfortable HEENT: Ecchymosis and swelling around left eye, reportedly chronic Respiratory system: Clear to auscultation. Respiratory effort normal. Cardiovascular system: S1 & S2 heard, RRR.  No JVD, murmurs, rubs, gallops or clicks. No pedal edema. Gastrointestinal system: Abdomen is nondistended, soft and nontender. No organomegaly or masses felt. Normal bowel sounds heard. Central nervous system: Alert and oriented. No focal neurological deficits. Extremities: Symmetric 5 x 5 power. Skin: No rashes, lesions or ulcers Psychiatry:  Judgement and insight appear normal. Mood & affect appropriate.     Data Reviewed: I have personally reviewed following labs and imaging studies  CBC: Recent Labs  Lab 02/25/18 1636 02/26/18 0614  WBC 8.0 6.0  NEUTROABS  --  4.1  HGB 10.8* 9.4*  HCT 33.9* 29.5*  MCV 96.3 96.7  PLT 218 622   Basic Metabolic Panel: Recent Labs  Lab 02/25/18 1735 02/26/18 0614  NA 138 139  K 3.6 3.9  CL 107 110  CO2 20* 18*  GLUCOSE 152* 97  BUN 52* 48*  CREATININE 3.54* 3.08*  CALCIUM 9.3 8.9   GFR: Estimated Creatinine Clearance: 17.3 mL/min (A) (by C-G formula based on SCr of 3.08 mg/dL (H)). Liver Function Tests: Recent Labs  Lab 02/25/18 1743 02/26/18 0614  AST 382* 202*  ALT 492* 380*  ALKPHOS 341* 311*  BILITOT 1.8* 1.1  PROT 6.9 6.5  ALBUMIN 3.3* 3.0*   Recent Labs  Lab 02/25/18 1743  LIPASE 25   No results for input(s): AMMONIA in the last 168 hours. Coagulation Profile: No results for input(s): INR, PROTIME in the last 168 hours. Cardiac Enzymes: Recent Labs  Lab 02/25/18 1743 02/25/18 1934 02/25/18 2349 02/26/18 0614 02/26/18 1227  CKTOTAL  --  60  --   --   --   TROPONINI 0.06*  --  0.06* 0.06* 0.05*   BNP (last 3 results) No results for input(s): PROBNP in the last 8760 hours. HbA1C: No results for input(s): HGBA1C in the last 72 hours. CBG: Recent Labs  Lab 02/26/18 0757 02/26/18 1125 02/26/18 1257 02/26/18 1657 02/26/18 1720  GLUCAP 89 213* 227* 62* 74   Lipid Profile: No results for input(s): CHOL, HDL, LDLCALC, TRIG, CHOLHDL, LDLDIRECT in the last 72 hours. Thyroid Function Tests: Recent Labs    02/26/18 0614  TSH 0.057*   Anemia Panel: No results for input(s): VITAMINB12, FOLATE, FERRITIN, TIBC, IRON, RETICCTPCT in the last 72 hours. Sepsis Labs: No results for input(s): PROCALCITON, LATICACIDVEN in the last 168 hours.  No results found for this or any previous visit (from the past 240 hour(s)).       Radiology  Studies: Ct Abdomen Pelvis Wo Contrast  Result Date: 02/25/2018 CLINICAL DATA:  Fatigue with weakness and loss of appetite EXAM: CT ABDOMEN AND PELVIS WITHOUT CONTRAST TECHNIQUE: Multidetector CT imaging of the abdomen and pelvis was performed following the standard protocol without IV contrast. COMPARISON:  CT 11/24/2017, 12/01/2014 FINDINGS: Lower chest: Lung bases demonstrate no acute consolidation or pleural effusion. Trace pericardial effusion. Borderline heart size. Hepatobiliary: Calcified granuloma in the liver. Multiple calcified stones in the gallbladder. Slightly enlarged extrahepatic common bowel duct measuring up to 9 mm. 9 mm stone in the distal common duct as before. Pancreas: Unremarkable. No pancreatic ductal dilatation or surrounding inflammatory changes. Spleen: Normal in size without focal abnormality. Adrenals/Urinary Tract: Adrenal glands are within normal limits. Hypodense lesions upper pole left kidney. Dilated extrarenal pelvis bilaterally. No ureteral stones. Slightly thick-walled bladder Stomach/Bowel: Stomach is within normal limits. Appendix appears normal. No evidence of bowel wall thickening, distention, or inflammatory changes. Sigmoid colon diverticular disease without acute inflammation Vascular/Lymphatic: Moderate aortic atherosclerosis. No aneurysmal dilatation. No significantly enlarged  lymph nodes. Reproductive: Prostate calcification Other: Negative for free air or free fluid. Musculoskeletal: Degenerative changes with ankylosis at L4 and L5. IMPRESSION: 1. Multiple calcified gallstones. Linear soft tissue density at the right abdominal skin surface coursing toward the fundus of the gallbladder, felt related to prior cholecystostomy tube placement. 9 mm stone in the distal common bile duct with mild extrahepatic biliary dilatation and slight central intra hepatic biliary dilatation. 2. Similar appearance of dilated extrarenal pelvises but without ureteral stone. Slightly  thick-walled appearance of the bladder 3. Sigmoid colon diverticular disease without acute inflammation. Electronically Signed   By: Donavan Foil M.D.   On: 02/25/2018 19:31   Dg Chest 2 View  Result Date: 02/25/2018 CLINICAL DATA:  Weakness. EXAM: CHEST - 2 VIEW COMPARISON:  11/24/2017 FINDINGS: Lungs are adequately inflated without focal consolidation or effusion. Borderline stable cardiomegaly. Mild calcified plaque over the aortic arch. Minimal degenerate change of the spine. IMPRESSION: No active cardiopulmonary disease. Electronically Signed   By: Marin Olp M.D.   On: 02/25/2018 19:34   Ct Head Wo Contrast  Result Date: 02/25/2018 CLINICAL DATA:  Fatigue and generalized weakness as well as decreased appetite 2 days. Recent fall. EXAM: CT HEAD WITHOUT CONTRAST TECHNIQUE: Contiguous axial images were obtained from the base of the skull through the vertex without intravenous contrast. COMPARISON:  07/28/2016 FINDINGS: Brain: Ventricles, cisterns and other CSF spaces are within normal as there is mild age related atrophic change. There is moderate chronic ischemic microvascular disease. Small old left frontal infarct. No mass, mass effect, shift of midline structures or acute hemorrhage. No evidence of acute infarction. Basal ganglia calcifications are present. Vascular: No hyperdense vessel or unexpected calcification. Skull: Normal. Negative for fracture or focal lesion. Sinuses/Orbits: Orbits are within normal. Paranasal sinuses are clear. Mastoid air cells are clear. Other: None. IMPRESSION: No acute findings. Moderate chronic ischemic microvascular disease. Mild age related atrophic change. Small old left frontal infarct. Electronically Signed   By: Marin Olp M.D.   On: 02/25/2018 19:25        Scheduled Meds: . amLODipine  10 mg Oral Daily  . [START ON 02/27/2018] aspirin EC  81 mg Oral Daily  . insulin aspart  0-15 Units Subcutaneous TID WC  . insulin aspart  0-5 Units Subcutaneous  QHS  . levETIRAcetam  250 mg Oral QHS  . levothyroxine  137 mcg Oral QAC breakfast  . olopatadine  1 drop Both Eyes BID  . ondansetron  4 mg Intravenous Once  . sodium chloride flush  3 mL Intravenous Q12H  . tamsulosin  0.4 mg Oral QPC supper   Continuous Infusions: . sodium chloride 75 mL/hr at 02/26/18 1357  . [START ON 02/27/2018] levofloxacin (LEVAQUIN) IV    . metronidazole Stopped (02/26/18 1459)     LOS: 1 day    Time spent: 25 minutes    Kathie Dike, MD [facility] Pager [phone]  If 7PM-7AM, please contact night-coverage [email or web address] Password [facility] 02/26/2018, 6:57 PM

## 2018-02-27 LAB — COMPREHENSIVE METABOLIC PANEL
ALT: 278 U/L — ABNORMAL HIGH (ref 17–63)
AST: 134 U/L — ABNORMAL HIGH (ref 15–41)
Albumin: 2.9 g/dL — ABNORMAL LOW (ref 3.5–5.0)
Alkaline Phosphatase: 280 U/L — ABNORMAL HIGH (ref 38–126)
Anion gap: 10 (ref 5–15)
BUN: 44 mg/dL — ABNORMAL HIGH (ref 6–20)
CO2: 18 mmol/L — ABNORMAL LOW (ref 22–32)
Calcium: 8.8 mg/dL — ABNORMAL LOW (ref 8.9–10.3)
Chloride: 109 mmol/L (ref 101–111)
Creatinine, Ser: 2.79 mg/dL — ABNORMAL HIGH (ref 0.61–1.24)
GFR calc Af Amer: 22 mL/min — ABNORMAL LOW (ref >60)
GFR calc non Af Amer: 19 mL/min — ABNORMAL LOW (ref >60)
Glucose, Bld: 121 mg/dL — ABNORMAL HIGH (ref 65–99)
Potassium: 3.7 mmol/L (ref 3.5–5.1)
Sodium: 137 mmol/L (ref 135–145)
Total Bilirubin: 0.9 mg/dL (ref 0.3–1.2)
Total Protein: 6.5 g/dL (ref 6.5–8.1)

## 2018-02-27 LAB — GLUCOSE, CAPILLARY
Glucose-Capillary: 122 mg/dL — ABNORMAL HIGH (ref 65–99)
Glucose-Capillary: 161 mg/dL — ABNORMAL HIGH (ref 65–99)
Glucose-Capillary: 120 mg/dL — ABNORMAL HIGH (ref 65–99)
Glucose-Capillary: 162 mg/dL — ABNORMAL HIGH (ref 65–99)

## 2018-02-27 LAB — CBC
HCT: 31.1 % — ABNORMAL LOW (ref 39.0–52.0)
Hemoglobin: 9.9 g/dL — ABNORMAL LOW (ref 13.0–17.0)
MCH: 30.6 pg (ref 26.0–34.0)
MCHC: 31.8 g/dL (ref 30.0–36.0)
MCV: 96 fL (ref 78.0–100.0)
Platelets: 215 10*3/uL (ref 150–400)
RBC: 3.24 MIL/uL — ABNORMAL LOW (ref 4.22–5.81)
RDW: 12.9 % (ref 11.5–15.5)
WBC: 6.3 10*3/uL (ref 4.0–10.5)

## 2018-02-27 LAB — PROTIME-INR
INR: 1.07
Prothrombin Time: 13.8 seconds (ref 11.4–15.2)

## 2018-02-27 NOTE — Progress Notes (Signed)
PROGRESS NOTE    Chase [name]  XFG:[id number] DOB: Jun 17, 1931 DOA: 02/25/2018 PCP: [name], MD    Brief Narrative:  84 year old male admitted to the hospital with elevated liver function tests and abdominal pain.  Found to have evidence of cholelithiasis and choledocholithiasis.  Has previous history of cholecystitis that was managed with cholecystostomy tube.  Gastroenterology following and patient is being considered for ERCP.  Dr. Laural Golden is discussing case with anesthesia to see if patient will be a candidate to have this done at [facility].  Patient's Plavix currently on hold for procedure.  He is on intravenous antibiotics.  Liver function tests are improving since admission.  He was seen by physical therapy who recommended skilled nursing facility placement on discharge for deconditioning.   Assessment & Plan:   Principal Problem:   Choledocholithiasis Active Problems:   Hypothyroidism   Uncontrolled type 2 diabetes mellitus with stage 4 chronic kidney disease, with long-term current use of insulin (HCC)   Seizure disorder, complex partial (HCC)   History of stroke   CKD (chronic kidney disease) stage 4, GFR 15-29 ml/min (HCC)   Elevated troponin   Essential hypertension, benign   1. Cholelithiasis and choledocholithiasis.  Patient presented with abdominal pain, elevated LFTs.  Imaging indicates choledocholithiasis.  Gastroenterology following.  He was started on intravenous antibiotics with levofloxacin and metronidazole.  He will likely need ERCP for further management.  Patient is chronically on Plavix which increases bleeding risk for procedure.  Since he does not appear septic, Plavix will be held and ERCP will not be performed urgently.  The patient will be monitored.  Dr. Laural Golden is planning on discussing case with anesthesia to see if it will be possible to have procedure performed to [facility] or whether he will need to have this done at a higher level of care.   Overall LFTs are trending down.  Continue IV antibiotics and repeat labs in the morning.   2. Chronic drainage from right upper quadrant.  Patient previously had a cholecystostomy tube and appears to have developed a tract that intermittently drains fluid.  Family reports that he is followed by Dr. Grandville Silos at [facility] surgery.  When patient does develop this drainage, they report that Dr. Grandville Silos may cauterize opening of tract in order to seal it. 3. chronic kidney disease stage IV.  Creatinine was 3.5 on admission.  Improved to 2.7 with gentle hydration.  Baseline creatinine appears to be around 3.  Continue to monitor. 4. Elevated troponin.  He does not have any chest pain or shortness of breath.  EKG without acute changes.  In the setting of elevated creatinine, minimal rise in troponin does not appear to be significant. 5. Diabetes.  A1c was 6.7 earlier this month.  Continue on 70/30 insulin 6. Hypertension.  Blood pressure currently stable.  Hold chlorthalidone.  Continue on Norvasc 7. Hypothyroidism.  Continue Synthroid 8. Seizure disorder.  Continue on Keppra 9. Generalized weakness.  Patient is deconditioned.  Seen by physical therapy who recommended skilled nursing facility placement..   DVT prophylaxis: SCDs Code Status: Full code Family Communication: Discussed with multiple family members at the bedside Disposition Plan: Seen by physical therapy who recommended skilled nursing facility placement.  Timing of ERCP will need to be determined by GI, whether it be performed on this admission or that patient returns for procedure.   Consultants:   Gastroenterology  Procedures:     Antimicrobials:   Levofloxacin 3/22 >  Flagyl 3/22 >  Subjective: Feeling better today.  Tolerating diet.  No vomiting.  Abdominal pain is better.  Objective: Vitals:   02/26/18 1500 02/26/18 2030 02/26/18 2036 02/27/18 0520  BP: 123/65 (!) 134/49  (!) 147/63  Pulse: 63 64  62  Resp:  18 20  18   Temp: 98.7 F (37.1 C) 98.6 F (37 C)  98.6 F (37 C)  TempSrc: Oral Oral  Oral  SpO2: 100% 100% 98% 100%  Weight:    76.9 kg (169 lb 8.5 oz)  Height:        Intake/Output Summary (Last 24 hours) at 02/27/2018 1836 Last data filed at 02/27/2018 0241 Gross per 24 hour  Intake 1300 ml  Output 1150 ml  Net 150 ml   Filed Weights   02/25/18 1528 02/26/18 0508 02/27/18 0520  Weight: 78.5 kg (173 lb) 76.8 kg (169 lb 5 oz) 76.9 kg (169 lb 8.5 oz)    Examination:  General exam: Alert, awake, oriented x 3 HEENT: Darkening of skin around left eye (chronic) Respiratory system: Clear to auscultation. Respiratory effort normal. Cardiovascular system:RRR. No murmurs, rubs, gallops. Gastrointestinal system: Abdomen is nondistended, soft and nontender. No organomegaly or masses felt. Normal bowel sounds heard.  Dressing over right upper quadrant draining clear fluid. Central nervous system: Alert and oriented. No focal neurological deficits. Extremities: No C/C/E, +pedal pulses Skin: No rashes, lesions or ulcers Psychiatry: Judgement and insight appear normal. Mood & affect appropriate.      Data Reviewed: I have personally reviewed following labs and imaging studies  CBC: Recent Labs  Lab 02/25/18 1636 02/26/18 0614 02/27/18 0710  WBC 8.0 6.0 6.3  NEUTROABS  --  4.1  --   HGB 10.8* 9.4* 9.9*  HCT 33.9* 29.5* 31.1*  MCV 96.3 96.7 96.0  PLT 218 210 785   Basic Metabolic Panel: Recent Labs  Lab 02/25/18 1735 02/26/18 0614 02/27/18 0710  NA 138 139 137  K 3.6 3.9 3.7  CL 107 110 109  CO2 20* 18* 18*  GLUCOSE 152* 97 121*  BUN 52* 48* 44*  CREATININE 3.54* 3.08* 2.79*  CALCIUM 9.3 8.9 8.8*   GFR: Estimated Creatinine Clearance: 19.1 mL/min (A) (by C-G formula based on SCr of 2.79 mg/dL (H)). Liver Function Tests: Recent Labs  Lab 02/25/18 1743 02/26/18 0614 02/27/18 0710  AST 382* 202* 134*  ALT 492* 380* 278*  ALKPHOS 341* 311* 280*  BILITOT 1.8*  1.1 0.9  PROT 6.9 6.5 6.5  ALBUMIN 3.3* 3.0* 2.9*   Recent Labs  Lab 02/25/18 1743  LIPASE 25   No results for input(s): AMMONIA in the last 168 hours. Coagulation Profile: Recent Labs  Lab 02/27/18 0710  INR 1.07   Cardiac Enzymes: Recent Labs  Lab 02/25/18 1743 02/25/18 1934 02/25/18 2349 02/26/18 0614 02/26/18 1227  CKTOTAL  --  60  --   --   --   TROPONINI 0.06*  --  0.06* 0.06* 0.05*   BNP (last 3 results) No results for input(s): PROBNP in the last 8760 hours. HbA1C: No results for input(s): HGBA1C in the last 72 hours. CBG: Recent Labs  Lab 02/26/18 1720 02/26/18 2035 02/27/18 0759 02/27/18 1130 02/27/18 1643  GLUCAP 74 178* 122* 161* 120*   Lipid Profile: No results for input(s): CHOL, HDL, LDLCALC, TRIG, CHOLHDL, LDLDIRECT in the last 72 hours. Thyroid Function Tests: Recent Labs    02/26/18 0614  TSH 0.057*   Anemia Panel: No results for input(s): VITAMINB12, FOLATE, FERRITIN, TIBC, IRON, RETICCTPCT in the  last 72 hours. Sepsis Labs: No results for input(s): PROCALCITON, LATICACIDVEN in the last 168 hours.  No results found for this or any previous visit (from the past 240 hour(s)).       Radiology Studies: Ct Abdomen Pelvis Wo Contrast  Result Date: 02/25/2018 CLINICAL DATA:  Fatigue with weakness and loss of appetite EXAM: CT ABDOMEN AND PELVIS WITHOUT CONTRAST TECHNIQUE: Multidetector CT imaging of the abdomen and pelvis was performed following the standard protocol without IV contrast. COMPARISON:  CT 11/24/2017, 12/01/2014 FINDINGS: Lower chest: Lung bases demonstrate no acute consolidation or pleural effusion. Trace pericardial effusion. Borderline heart size. Hepatobiliary: Calcified granuloma in the liver. Multiple calcified stones in the gallbladder. Slightly enlarged extrahepatic common bowel duct measuring up to 9 mm. 9 mm stone in the distal common duct as before. Pancreas: Unremarkable. No pancreatic ductal dilatation or  surrounding inflammatory changes. Spleen: Normal in size without focal abnormality. Adrenals/Urinary Tract: Adrenal glands are within normal limits. Hypodense lesions upper pole left kidney. Dilated extrarenal pelvis bilaterally. No ureteral stones. Slightly thick-walled bladder Stomach/Bowel: Stomach is within normal limits. Appendix appears normal. No evidence of bowel wall thickening, distention, or inflammatory changes. Sigmoid colon diverticular disease without acute inflammation Vascular/Lymphatic: Moderate aortic atherosclerosis. No aneurysmal dilatation. No significantly enlarged lymph nodes. Reproductive: Prostate calcification Other: Negative for free air or free fluid. Musculoskeletal: Degenerative changes with ankylosis at L4 and L5. IMPRESSION: 1. Multiple calcified gallstones. Linear soft tissue density at the right abdominal skin surface coursing toward the fundus of the gallbladder, felt related to prior cholecystostomy tube placement. 9 mm stone in the distal common bile duct with mild extrahepatic biliary dilatation and slight central intra hepatic biliary dilatation. 2. Similar appearance of dilated extrarenal pelvises but without ureteral stone. Slightly thick-walled appearance of the bladder 3. Sigmoid colon diverticular disease without acute inflammation. Electronically Signed   By: Donavan Foil M.D.   On: 02/25/2018 19:31   Dg Chest 2 View  Result Date: 02/25/2018 CLINICAL DATA:  Weakness. EXAM: CHEST - 2 VIEW COMPARISON:  11/24/2017 FINDINGS: Lungs are adequately inflated without focal consolidation or effusion. Borderline stable cardiomegaly. Mild calcified plaque over the aortic arch. Minimal degenerate change of the spine. IMPRESSION: No active cardiopulmonary disease. Electronically Signed   By: Marin Olp M.D.   On: 02/25/2018 19:34   Ct Head Wo Contrast  Result Date: 02/25/2018 CLINICAL DATA:  Fatigue and generalized weakness as well as decreased appetite 2 days. Recent  fall. EXAM: CT HEAD WITHOUT CONTRAST TECHNIQUE: Contiguous axial images were obtained from the base of the skull through the vertex without intravenous contrast. COMPARISON:  07/28/2016 FINDINGS: Brain: Ventricles, cisterns and other CSF spaces are within normal as there is mild age related atrophic change. There is moderate chronic ischemic microvascular disease. Small old left frontal infarct. No mass, mass effect, shift of midline structures or acute hemorrhage. No evidence of acute infarction. Basal ganglia calcifications are present. Vascular: No hyperdense vessel or unexpected calcification. Skull: Normal. Negative for fracture or focal lesion. Sinuses/Orbits: Orbits are within normal. Paranasal sinuses are clear. Mastoid air cells are clear. Other: None. IMPRESSION: No acute findings. Moderate chronic ischemic microvascular disease. Mild age related atrophic change. Small old left frontal infarct. Electronically Signed   By: Marin Olp M.D.   On: 02/25/2018 19:25        Scheduled Meds: . amLODipine  10 mg Oral Daily  . aspirin EC  81 mg Oral Daily  . insulin aspart  0-15 Units Subcutaneous TID WC  .  insulin aspart  0-5 Units Subcutaneous QHS  . levETIRAcetam  250 mg Oral QHS  . levothyroxine  137 mcg Oral QAC breakfast  . olopatadine  1 drop Both Eyes BID  . ondansetron  4 mg Intravenous Once  . sodium chloride flush  3 mL Intravenous Q12H  . tamsulosin  0.4 mg Oral QPC supper   Continuous Infusions: . sodium chloride 75 mL/hr at 02/26/18 1357  . levofloxacin (LEVAQUIN) IV    . metronidazole Stopped (02/27/18 1534)     LOS: 2 days    Time spent: 25 minutes    Kathie Dike, MD [facility] Pager [phone]  If 7PM-7AM, please contact night-coverage [email or web address] Password [facility] 02/27/2018, 6:36 PM

## 2018-02-27 NOTE — Evaluation (Addendum)
Physical Therapy Evaluation Patient Details Name: Chase [name] MRN: [id number] DOB: Feb 07, 1931 Today's Date: 02/27/2018   History of Present Illness  Chase [name] is a 84 y.o. male with medical history significant for history of CVA, chronic kidney disease stage IV, hypothyroidism, seizure disorder, hypertension, and insulin-dependent diabetes mellitus, now presenting to the emergency department for evaluation of generalized weakness and abdominal pain.  Patient is accompanied by family who provide most of the history.  He has reportedly been developing progressive generalized weakness and fatigue over the past several days, manifest with difficulty using his walker unassisted.  Patient had been complaining of pain in the abdomen and vomited once yesterday.      Clinical Impression  Patient was found awake and alert in bed with family present in room. Patient was able to perform some rolling to the side with the head of bed elevated and patient required moderate assistance to perform supine to sitting at edge of bed. Patient required maximal assistance to perform sit to stand from the bed using a rolling walker and with gait belt. Patient was unsafe and unable to take steps at this time due significant posterior lean and unsteadiness. Patient was returned to bed and nursing staff notified that patient needed assistance for toileting. Patient left with nursing tech and family in room. Patient would benefit from continued skilled physical therapy to address patient's deficits in strength, basic transfers, ambulation, balance, and overall functional mobility. Patient would require further therapy at this time in a skilled facility because patient's wife is unable to provide the assistance patient would require if he were to return home at this time.     Follow Up Recommendations SNF    Equipment Recommendations       Recommendations for Other Services       Precautions / Restrictions  Precautions Precautions: Fall Restrictions Weight Bearing Restrictions: No      Mobility  Bed Mobility Overal bed mobility: Needs Assistance Bed Mobility: Supine to Sit     Supine to sit: Mod assist;HOB elevated     General bed mobility comments: Patient was able to slowly move some to perform bed mobilty to his side, but required moderate assistance to perform supine to sit with support from patient's trunk   Transfers Overall transfer level: Needs assistance Equipment used: Rolling walker (2 wheeled) Transfers: Sit to/from Stand Sit to Stand: Max assist         General transfer comment: Patient required max assist with use of rolling walker in order to perform sit to stand from bed.   Ambulation/Gait Ambulation/Gait assistance: (Patient posteriorly leaning and fatigued upon standing unsafe to ambulate )              Stairs            Wheelchair Mobility    Modified Rankin (Stroke Patients Only)       Balance Overall balance assessment: Needs assistance Sitting-balance support: Feet supported;Bilateral upper extremity supported Sitting balance-Leahy Scale: Good Sitting balance - Comments: Patient was able to maintain his sitting balance at the edge of bed with feet and upper extremities supported.    Standing balance support: Bilateral upper extremity supported Standing balance-Leahy Scale: Poor Standing balance comment: Patient demonstrated significant posterior lean and weight shift posteriorly onto heels, significant forward trunk flexion, and unsteadiness  Pertinent Vitals/Pain Pain Assessment: No/denies pain    Home Living Family/patient expects to be discharged to:: Skilled nursing facility Living Arrangements: Spouse/significant other Available Help at Discharge: Other (Comment)(Wife home 24/7 however unable to provide required assistance) Type of Home: House Home Access: Ramped entrance     Home  Layout: One level Home Equipment: [place] - 2 wheels;Wheelchair - manual      Prior Function Level of Independence: Needs assistance   Gait / Transfers Assistance Needed: Uses rolling walker and required occassional assistance to stand  ADL's / Homemaking Assistance Needed: Wife assists with ADL's         Hand Dominance        Extremity/Trunk Assessment   Upper Extremity Assessment Upper Extremity Assessment: Generalized weakness    Lower Extremity Assessment Lower Extremity Assessment: Generalized weakness    Cervical / Trunk Assessment Cervical / Trunk Assessment: Kyphotic  Communication   Communication: No difficulties  Cognition Arousal/Alertness: Awake/alert Behavior During Therapy: WFL for tasks assessed/performed Overall Cognitive Status: Within Functional Limits for tasks assessed                                 General Comments: Patient able to report his name and date of birth      General Comments      Exercises     Assessment/Plan    PT Assessment Patient needs continued PT services  PT Problem List Decreased strength;Decreased activity tolerance;Decreased balance;Decreased mobility       PT Treatment Interventions DME instruction;Balance training;Gait training;Neuromuscular re-education;Functional mobility training;Therapeutic activities;Therapeutic exercise;Patient/family education    PT Goals (Current goals can be found in the Care Plan section)  Acute Rehab PT Goals Patient Stated Goal: To go somewhere patient can get more therapy before going home PT Goal Formulation: With patient/family Time For Goal Achievement: 03/06/18 Potential to Achieve Goals: Good    Frequency Min 3X/week   Barriers to discharge Other (comment) Patient's wife would not be able to provide the assistance patient needs at this time for basic transfers and mobility     Co-evaluation               AM-PAC PT "6 Clicks" Daily Activity  Outcome  Measure Difficulty turning over in bed (including adjusting bedclothes, sheets and blankets)?: A Little Difficulty moving from lying on back to sitting on the side of the bed? : A Lot Difficulty sitting down on and standing up from a chair with arms (e.g., wheelchair, bedside commode, etc,.)?: A Lot Help needed moving to and from a bed to chair (including a wheelchair)?: A Lot Help needed walking in hospital room?: A Lot Help needed climbing 3-5 steps with a railing? : Total 6 Click Score: 12    End of Session Equipment Utilized During Treatment: Gait belt Activity Tolerance: Patient limited by fatigue;Patient tolerated treatment well Patient left: in bed;with nursing/sitter in room;with family/visitor present(Left with nursing tech to assist patient with toileting discussed that patient was found in bed with SCDs on and bed alarm set upon entering room) Nurse Communication: Mobility status PT Visit Diagnosis: Unsteadiness on feet (R26.81);Other abnormalities of gait and mobility (R26.89);Muscle weakness (generalized) (M62.81)    Time: 5188-4166 PT Time Calculation (min) (ACUTE ONLY): 32 min   Charges:   PT Evaluation $PT Eval Moderate Complexity: 1 Mod     PT G Codes:       Clarene Critchley PT, DPT 1:21 PM, 02/27/18 [phone]

## 2018-02-27 NOTE — Progress Notes (Signed)
Patient's wound to right side of abdomen leaking greenish brown drainage.  Dressing changed earlier due to heavy drainage.  Dressing now saturated again .  Site cleansed with NS and new dressing applied.  Patient's wife at bedside.  Dr. Roderic Palau notified via text page.

## 2018-02-27 NOTE — Progress Notes (Signed)
  Subjective:  Patient has no complaints.  He denies abdominal pain.  No nausea or vomiting reported.  According to his family members his intake has been satisfactory.  Also concerned about his deconditioning.  Objective: Blood pressure (!) 147/63, pulse 62, temperature 98.6 F (37 C), temperature source Oral, resp. rate 18, height 5\' 8"  (1.727 m), weight 169 lb 8.5 oz (76.9 kg), SpO2 100 %. Patient is alert and in no acute distress.  Labs/studies Results:  Recent Labs    03-16-2018 1636 02/26/18 0614 02/27/18 0710  WBC 8.0 6.0 6.3  HGB 10.8* 9.4* 9.9*  HCT 33.9* 29.5* 31.1*  PLT 218 210 215    BMET  Recent Labs    March 16, 2018 1735 02/26/18 0614 02/27/18 0710  NA 138 139 137  K 3.6 3.9 3.7  CL 107 110 109  CO2 20* 18* 18*  GLUCOSE 152* 97 121*  BUN 52* 48* 44*  CREATININE 3.54* 3.08* 2.79*  CALCIUM 9.3 8.9 8.8*    LFT  Recent Labs    03/16/18 1743 02/26/18 0614 02/27/18 0710  PROT 6.9 6.5 6.5  ALBUMIN 3.3* 3.0* 2.9*  AST 382* 202* 134*  ALT 492* 380* 278*  ALKPHOS 341* 311* 280*  BILITOT 1.8* 1.1 0.9  BILIDIR 0.9*  --   --   IBILI 0.9  --   --     PT/INR  Recent Labs    02/27/18 0710  LABPROT 13.8  INR 1.07     Assessment:  #1.  Choledocholithiasis.  Bili and transaminases are down.  Therefore he does not have CBD obstruction.  He is tolerating a diet.  Therefore can go home and return for ERCP next week at this facility if cleared by anesthesia.  If this is not the case he will have to be referred to tertiary center.  He also has a contracted gallbladder with calcified stone in it.  #2.  History of CVA.  Clopidogrel is on hold but patient remains on aspirin.  #3.  Chronic kidney disease.  #4.  Diabetes mellitus.  Recommendations:  Unless there are other issues to be resolved patient can go home today and return for ERCP next week pending anesthesia evaluation and clearance. Will need to continue Levaquin until the procedure. Patient's condition  discussed in detail with his wife there is son and 2 daughters and grandson who are in the room.

## 2018-02-27 NOTE — Plan of Care (Signed)
  Problem: Acute Rehab PT Goals(only PT should resolve) Goal: Pt Will Go Supine/Side To Sit Outcome: Progressing Flowsheets (Taken 02/27/2018 1322) Pt will go Supine/Side to Sit: with minimal assist Goal: Patient Will Perform Sitting Balance Outcome: Progressing Flowsheets (Taken 02/27/2018 1322) Patient will perform sitting balance: Independently Goal: Patient Will Transfer Sit To/From Stand Outcome: Progressing Flowsheets (Taken 02/27/2018 1322) Patient will transfer sit to/from stand: with supervision Goal: Pt Will Transfer Bed To Chair/Chair To Bed Outcome: Progressing Flowsheets (Taken 02/27/2018 1322) Pt will Transfer Bed to Chair/Chair to Bed: with supervision Goal: Pt Will Ambulate Outcome: Progressing Flowsheets (Taken 02/27/2018 1322) Pt will Ambulate: 15 feet;with supervision;with rolling walker  Clarene Critchley PT, DPT 1:24 PM, 02/27/18 [phone]

## 2018-02-28 DIAGNOSIS — R5381 Other malaise: Secondary | ICD-10-CM | POA: Diagnosis present

## 2018-02-28 DIAGNOSIS — R531 Weakness: Secondary | ICD-10-CM

## 2018-02-28 LAB — COMPREHENSIVE METABOLIC PANEL
ALT: 239 U/L — ABNORMAL HIGH (ref 17–63)
AST: 171 U/L — ABNORMAL HIGH (ref 15–41)
Albumin: 2.8 g/dL — ABNORMAL LOW (ref 3.5–5.0)
Alkaline Phosphatase: 274 U/L — ABNORMAL HIGH (ref 38–126)
Anion gap: 9 (ref 5–15)
BUN: 41 mg/dL — ABNORMAL HIGH (ref 6–20)
CO2: 18 mmol/L — ABNORMAL LOW (ref 22–32)
Calcium: 8.4 mg/dL — ABNORMAL LOW (ref 8.9–10.3)
Chloride: 107 mmol/L (ref 101–111)
Creatinine, Ser: 2.67 mg/dL — ABNORMAL HIGH (ref 0.61–1.24)
GFR calc Af Amer: 24 mL/min — ABNORMAL LOW (ref >60)
GFR calc non Af Amer: 20 mL/min — ABNORMAL LOW (ref >60)
Glucose, Bld: 109 mg/dL — ABNORMAL HIGH (ref 65–99)
Potassium: 3.8 mmol/L (ref 3.5–5.1)
Sodium: 134 mmol/L — ABNORMAL LOW (ref 135–145)
Total Bilirubin: 1 mg/dL (ref 0.3–1.2)
Total Protein: 6 g/dL — ABNORMAL LOW (ref 6.5–8.1)

## 2018-02-28 LAB — GLUCOSE, CAPILLARY
Glucose-Capillary: 117 mg/dL — ABNORMAL HIGH (ref 65–99)
Glucose-Capillary: 155 mg/dL — ABNORMAL HIGH (ref 65–99)
Glucose-Capillary: 116 mg/dL — ABNORMAL HIGH (ref 65–99)
Glucose-Capillary: 87 mg/dL (ref 65–99)
Glucose-Capillary: 155 mg/dL — ABNORMAL HIGH (ref 65–99)

## 2018-02-28 NOTE — Progress Notes (Signed)
  Subjective:  Patient complaints of abdominal pain around the site where he had cholecystostomy tube.  His wife reports the drainage has increased significantly since last night.  It is bilious fluid. He denies nausea or vomiting.  Objective: Blood pressure (!) 168/70, pulse 62, temperature 98.8 F (37.1 C), temperature source Oral, resp. rate 16, height 5\' 8"  (1.727 m), weight 170 lb 6.7 oz (77.3 kg), SpO2 99 %. Patient is alert and in no acute distress. He has edema to the right upper eyelid. Abdomen is full.  There is small amount of granulation tissue at previous cholecystostomy tube placement site.  There is skin excoriation below it.  Labs/studies Results:  Recent Labs    2018/03/20 1636 02/26/18 0614 02/27/18 0710  WBC 8.0 6.0 6.3  HGB 10.8* 9.4* 9.9*  HCT 33.9* 29.5* 31.1*  PLT 218 210 215    BMET  Recent Labs    02/26/18 0614 02/27/18 0710 02/28/18 0559  NA 139 137 134*  K 3.9 3.7 3.8  CL 110 109 107  CO2 18* 18* 18*  GLUCOSE 97 121* 109*  BUN 48* 44* 41*  CREATININE 3.08* 2.79* 2.67*  CALCIUM 8.9 8.8* 8.4*    LFT  Recent Labs    03/20/2018 1743 02/26/18 0614 02/27/18 0710 02/28/18 0559  PROT 6.9 6.5 6.5 6.0*  ALBUMIN 3.3* 3.0* 2.9* 2.8*  AST 382* 202* 134* 171*  ALT 492* 380* 278* 239*  ALKPHOS 341* 311* 280* 274*  BILITOT 1.8* 1.1 0.9 1.0  BILIDIR 0.9*  --   --   --   IBILI 0.9  --   --   --     PT/INR  Recent Labs    02/27/18 0710  LABPROT 13.8  INR 1.07      Assessment:  #1.  Choledocholithiasis.  Discussed with Dr. Patsey Berthold who agrees with giving him anesthesia for ERCP. ERCP delayed because patient has been taking clopidogrel until this admission.  #2.  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  Fistula is open now because of choledocholithiasis and biliary obstruction. It should heal once bile duct cleared of stones.   Recommendations:  ERCP with sphincterotomy and stone extraction on 03/02/2018.

## 2018-02-28 NOTE — NC FL2 (Signed)
[place] LEVEL OF CARE SCREENING TOOL     IDENTIFICATION  Patient Name: Chase [name] Birthdate: 1931-07-14 Sex: male Admission Date (Current Location): 02/25/2018  [facility] and [place] Number:  [employer] and Address:  [facility] [address], [place]      Provider Number: [id number]  Attending Physician Name and Address:  Rama, Venetia Maxon, MD  Relative Name and Phone Number:       Current Level of Care: Hospital Recommended Level of Care: [place] Prior Approval Number:    Date Approved/Denied:   PASRR Number: [id number] )  Discharge Plan: SNF    Current Diagnoses: Patient Active Problem List   Diagnosis Date Noted  . Physical deconditioning 02/28/2018  . Choledocholithiasis 02/25/2018  . Fistula of bile duct 02/14/2018  . Dermatitis 02/25/2017  . Essential hypertension, benign 12/13/2015  . Calculus of gallbladder with acute cholecystitis without obstruction   . RBBB (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 fascicular block)   . Hypertensive heart disease 12/01/2014  . Elevated troponin 12/01/2014  . Chronic venous insufficiency 11/16/2014  . Carotid artery disease ([place]) 05/18/2014  . Poor balance 09/11/2013  . Difficulty walking 09/11/2013  . Hx of falling 09/11/2013  . History of stroke 09/07/2012  . CKD (chronic kidney disease) stage 4, GFR 15-29 ml/min (HCC) 09/07/2012  . Anemia 09/07/2012  . Cholelithiasis 08/10/2012  . Peripheral autonomic neuropathy due to diabetes mellitus ([place]) 03/14/2012  . Seizure disorder, complex partial ([place]) 03/07/2012  . Uncontrolled type 2 diabetes mellitus with stage 4 chronic kidney disease, with long-term current use of insulin ([place])   . DIABETIC ULCER, LEFT LEG 08/15/2009  . Type 1 diabetes mellitus with diabetic nephropathy ([place]) 08/21/2008  . Hypothyroidism 03/09/2008  . Mixed hyperlipidemia 03/09/2008  . Renovascular hypertension  03/09/2008  . Peripheral vascular disease ([place]) 03/09/2008  . DEGENERATIVE JOINT DISEASE, KNEE 01/26/2008  . Osteoarthritis of spine 01/26/2008  . Spinal stenosis     Orientation RESPIRATION BLADDER Height & Weight     Self, Time, Situation, Place  Normal Incontinent Weight: 170 lb 6.7 oz (77.3 kg) Height:  5\' 8"  (172.7 cm)  BEHAVIORAL SYMPTOMS/MOOD NEUROLOGICAL BOWEL NUTRITION STATUS      Incontinent Diet(Carb modified)  AMBULATORY STATUS COMMUNICATION OF NEEDS Skin   Extensive Assist Verbally Surgical wounds(wound/incision: rt. shoulder, abdomen, left leg, medial)                       Personal Care Assistance Level of Assistance  Bathing, Feeding, Dressing Bathing Assistance: Limited assistance Feeding assistance: Independent Dressing Assistance: Limited assistance     Functional Limitations Info  Sight, Speech, Hearing Sight Info: Adequate Hearing Info: Adequate Speech Info: Adequate    SPECIAL CARE FACTORS FREQUENCY  PT (By licensed PT)     PT Frequency: 5x/week              Contractures Contractures Info: Not present    Additional Factors Info  Code Status, Allergies Code Status Info: Full Code Allergies Info: [employer] Advanced Aspirin, Pencillins            Current Medications (02/28/2018):  This is the current hospital active medication list Current Facility-Administered Medications  Medication Dose Route Frequency Provider Last Rate Last Dose  . 0.45 % sodium chloride infusion   Intravenous Continuous Kathie Dike, MD 75 mL/hr at 02/27/18 2052    . acetaminophen (TYLENOL) tablet 650 mg  650 mg Oral Q6H  PRN Vianne Bulls, MD       Or  . acetaminophen (TYLENOL) suppository 650 mg  650 mg Rectal Q6H PRN Opyd, Ilene Qua, MD      . amLODipine (NORVASC) tablet 10 mg  10 mg Oral Daily Opyd, Ilene Qua, MD   10 mg at 02/28/18 1105  . fentaNYL (SUBLIMAZE) injection 25-50 mcg  25-50 mcg Intravenous Q2H PRN Opyd, Ilene Qua, MD      . insulin aspart  (novoLOG) injection 0-15 Units  0-15 Units Subcutaneous TID WC Kathie Dike, MD   3 Units at 02/28/18 1332  . insulin aspart (novoLOG) injection 0-5 Units  0-5 Units Subcutaneous QHS Kathie Dike, MD      . levETIRAcetam (KEPPRA) tablet 250 mg  250 mg Oral QHS Opyd, Ilene Qua, MD   250 mg at 02/27/18 2131  . levofloxacin (LEVAQUIN) IVPB 750 mg  750 mg Intravenous Q48H Kathie Dike, MD   Stopped at 02/27/18 2301  . levothyroxine (SYNTHROID, LEVOTHROID) tablet 137 mcg  137 mcg Oral QAC breakfast Opyd, Ilene Qua, MD   137 mcg at 02/28/18 [phone]  . metroNIDAZOLE (FLAGYL) IVPB 500 mg  500 mg Intravenous Q8H Opyd, Ilene Qua, MD   Stopped at 02/28/18 1439  . olopatadine (PATANOL) 0.1 % ophthalmic solution 1 drop  1 drop Both Eyes BID Opyd, Ilene Qua, MD   1 drop at 02/28/18 1107  . ondansetron (ZOFRAN) injection 4 mg  4 mg Intravenous Once Opyd, Ilene Qua, MD      . ondansetron (ZOFRAN) tablet 4 mg  4 mg Oral Q6H PRN Opyd, Ilene Qua, MD       Or  . ondansetron (ZOFRAN) injection 4 mg  4 mg Intravenous Q6H PRN Opyd, Ilene Qua, MD      . sodium chloride flush (NS) 0.9 % injection 3 mL  3 mL Intravenous Q12H Opyd, Ilene Qua, MD   3 mL at 02/28/18 1106  . tamsulosin (FLOMAX) capsule 0.4 mg  0.4 mg Oral QPC supper Opyd, Ilene Qua, MD   0.4 mg at 02/28/18 1711     Discharge Medications: Please see discharge summary for a list of discharge medications.  Relevant Imaging Results:  Relevant Lab Results:   Additional Information SSN [id number]. Patient has limited use of right hand/arm due to previous stroke.   Eyleen Rawlinson, Clydene Pugh, LCSW

## 2018-02-28 NOTE — Plan of Care (Signed)
Pt is cooperative.

## 2018-02-28 NOTE — Progress Notes (Signed)
Late entry from 2101: This RN was educating pt on POC. Rn asked pt if he was educated on ERCP to be performed on Wednesday. Pt stated he couldn't remember if he was or not. RN adv pt of consent form that needed to be signed, pt stated he wanted to wait for his wife before signing anything. This RN will pass on to day shift.

## 2018-02-28 NOTE — Clinical Social Work Placement (Signed)
   CLINICAL SOCIAL WORK PLACEMENT  NOTE  Date:  02/28/2018  Patient Details  Name: Chase [name] MRN: [id number] Date of Birth: 11-09-1931  Clinical Social Work is seeking post-discharge placement for this patient at the [place] level of care (*CSW will initial, date and re-position this form in  chart as items are completed):  Yes   Patient/family provided with [facility] Work Department's list of facilities offering this level of care within the geographic area requested by the patient (or if unable, by the patient's family).  Yes   Patient/family informed of their freedom to choose among providers that offer the needed level of care, that participate in Medicare, Medicaid or managed care program needed by the patient, have an available bed and are willing to accept the patient.  Yes   Patient/family informed of [facility]'s ownership interest in [facility] and [facility], as well as of the fact that they are under no obligation to receive care at these facilities.  PASRR submitted to EDS on 02/28/18     PASRR number received on 02/28/18     Existing PASRR number confirmed on       FL2 transmitted to all facilities in geographic area requested by pt/family on 02/28/18     FL2 transmitted to all facilities within larger geographic area on       Patient informed that his/her managed care company has contracts with or will negotiate with certain facilities, including the following:            Patient/family informed of bed offers received.  Patient chooses bed at       Physician recommends and patient chooses bed at      Patient to be transferred to   on  .  Patient to be transferred to facility by       Patient family notified on   of transfer.  Name of family member notified:        PHYSICIAN       Additional Comment:    _______________________________________________ [name], LCSW 02/28/2018, 5:14 PM

## 2018-02-28 NOTE — Progress Notes (Signed)
Pts wound to Right side of abdomen where his biliary tube used to be draining large amts of greenish/yellow drainage. Dressing changed d/t being saturated. Day Nurse Olean Ree stated Hospitalist Dr. Roderic Palau aware of this. Will continue to monitor.

## 2018-02-28 NOTE — NC FL2 (Deleted)
[place] LEVEL OF CARE SCREENING TOOL     IDENTIFICATION  Patient Name: Chase [name] Birthdate: 03/14/1931 Sex: male Admission Date (Current Location): 02/25/2018  [facility] and [place] Number:  [employer] and Address:  [place] [address], [place]      Provider Number: [id number]  Attending Physician Name and Address:  Rama, Venetia Maxon, MD  Relative Name and Phone Number:       Current Level of Care: Hospital Recommended Level of Care: [place] Prior Approval Number:    Date Approved/Denied:   PASRR Number: [id number] M([id number] )  Discharge Plan: SNF    Current Diagnoses: Patient Active Problem List   Diagnosis Date Noted  . Physical deconditioning 02/28/2018  . Choledocholithiasis 02/25/2018  . Fistula of bile duct 02/14/2018  . Dermatitis 02/25/2017  . Essential hypertension, benign 12/13/2015  . Calculus of gallbladder with acute cholecystitis without obstruction   . RBBB (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 fascicular block)   . Hypertensive heart disease 12/01/2014  . Elevated troponin 12/01/2014  . Chronic venous insufficiency 11/16/2014  . Carotid artery disease ([place]) 05/18/2014  . Poor balance 09/11/2013  . Difficulty walking 09/11/2013  . Hx of falling 09/11/2013  . History of stroke 09/07/2012  . CKD (chronic kidney disease) stage 4, GFR 15-29 ml/min (HCC) 09/07/2012  . Anemia 09/07/2012  . Cholelithiasis 08/10/2012  . Peripheral autonomic neuropathy due to diabetes mellitus ([place]) 03/14/2012  . Seizure disorder, complex partial ([place]) 03/07/2012  . Uncontrolled type 2 diabetes mellitus with stage 4 chronic kidney disease, with long-term current use of insulin ([place])   . DIABETIC ULCER, LEFT LEG 08/15/2009  . Type 1 diabetes mellitus with diabetic nephropathy ([place]) 08/21/2008  . Hypothyroidism 03/09/2008  . Mixed hyperlipidemia 03/09/2008  . Renovascular hypertension  03/09/2008  . Peripheral vascular disease ([place]) 03/09/2008  . DEGENERATIVE JOINT DISEASE, KNEE 01/26/2008  . Osteoarthritis of spine 01/26/2008  . Spinal stenosis     Orientation RESPIRATION BLADDER Height & Weight     Self, Time, Situation, Place  Normal Incontinent Weight: 170 lb 6.7 oz (77.3 kg) Height:  5\' 8"  (172.7 cm)  BEHAVIORAL SYMPTOMS/MOOD NEUROLOGICAL BOWEL NUTRITION STATUS      Incontinent Diet(Carb modified)  AMBULATORY STATUS COMMUNICATION OF NEEDS Skin   Extensive Assist Verbally Surgical wounds(wound/incision: rt. shoulder, abdomen, left leg, medial)                       Personal Care Assistance Level of Assistance  Bathing, Feeding, Dressing Bathing Assistance: Limited assistance Feeding assistance: Independent Dressing Assistance: Limited assistance     Functional Limitations Info  Sight, Speech, Hearing Sight Info: Adequate Hearing Info: Adequate Speech Info: Adequate    SPECIAL CARE FACTORS FREQUENCY  PT (By licensed PT)     PT Frequency: 5x/week              Contractures Contractures Info: Not present    Additional Factors Info  Code Status, Allergies Code Status Info: Full Code Allergies Info: Bayer Advanced Aspirin, Pencillins            Current Medications (02/28/2018):  This is the current hospital active medication list Current Facility-Administered Medications  Medication Dose Route Frequency Provider Last Rate Last Dose  . 0.45 % sodium chloride infusion   Intravenous Continuous Kathie Dike, MD 75 mL/hr at 02/27/18 2052    . acetaminophen (TYLENOL) tablet 650 mg  650 mg Oral Q6H  PRN Vianne Bulls, MD       Or  . acetaminophen (TYLENOL) suppository 650 mg  650 mg Rectal Q6H PRN Opyd, Ilene Qua, MD      . amLODipine (NORVASC) tablet 10 mg  10 mg Oral Daily Opyd, Ilene Qua, MD   10 mg at 02/28/18 1105  . fentaNYL (SUBLIMAZE) injection 25-50 mcg  25-50 mcg Intravenous Q2H PRN Opyd, Ilene Qua, MD      . insulin aspart  (novoLOG) injection 0-15 Units  0-15 Units Subcutaneous TID WC Kathie Dike, MD   3 Units at 02/28/18 1332  . insulin aspart (novoLOG) injection 0-5 Units  0-5 Units Subcutaneous QHS Kathie Dike, MD      . levETIRAcetam (KEPPRA) tablet 250 mg  250 mg Oral QHS Opyd, Ilene Qua, MD   250 mg at 02/27/18 2131  . levofloxacin (LEVAQUIN) IVPB 750 mg  750 mg Intravenous Q48H Kathie Dike, MD   Stopped at 02/27/18 2301  . levothyroxine (SYNTHROID, LEVOTHROID) tablet 137 mcg  137 mcg Oral QAC breakfast Opyd, Ilene Qua, MD   137 mcg at 02/28/18 [phone]  . metroNIDAZOLE (FLAGYL) IVPB 500 mg  500 mg Intravenous Q8H Opyd, Ilene Qua, MD   Stopped at 02/28/18 1439  . olopatadine (PATANOL) 0.1 % ophthalmic solution 1 drop  1 drop Both Eyes BID Opyd, Ilene Qua, MD   1 drop at 02/28/18 1107  . ondansetron (ZOFRAN) injection 4 mg  4 mg Intravenous Once Opyd, Ilene Qua, MD      . ondansetron (ZOFRAN) tablet 4 mg  4 mg Oral Q6H PRN Opyd, Ilene Qua, MD       Or  . ondansetron (ZOFRAN) injection 4 mg  4 mg Intravenous Q6H PRN Opyd, Ilene Qua, MD      . sodium chloride flush (NS) 0.9 % injection 3 mL  3 mL Intravenous Q12H Opyd, Ilene Qua, MD   3 mL at 02/28/18 1106  . tamsulosin (FLOMAX) capsule 0.4 mg  0.4 mg Oral QPC supper Opyd, Ilene Qua, MD   0.4 mg at 02/27/18 1903     Discharge Medications: Please see discharge summary for a list of discharge medications.  Relevant Imaging Results:  Relevant Lab Results:   Additional Information SSN [id number], Clydene Pugh, LCSW

## 2018-02-28 NOTE — Progress Notes (Signed)
Progress Note    Chase [name]  [id number]:[id number] DOB: 04-Sep-1931  DOA: 02/25/2018 PCP: Chase Helper, MD    Brief Narrative:   Chief complaint: Follow-up weakness and abdominal pain.  Medical records reviewed and are as summarized below:  Chase [name] is an 84 y.o. male with a PMH of CVA, stage IV CK D, hypothyroidism, seizure disorder, hypertension, insulin-dependent diabetes who was admitted 02/25/18 for evaluation of generalized weakness associated with abdominal pain. Found to have evidence of cholelithiasis and choledocholithiasis. gI following with possible plans for ERCP.  Assessment/Plan:   Principal Problem:   Cholelithiasis and choledocholithiasis/elevated LFTs GI following with plans for ERCP on 03/02/18. Continue Levaquin and metronidazole. Plavix on hold in anticipation of procedure. LFTs trending down.  Active Problems:   Sinus tract/fistula from prior cholecystostomy tube Intermittently drains fluid. Being followed by Dr. Grandville Silos as an outpatient. Requires intermittent cauterization.  Will ask surgeon to reevaluate.    Hypothyroidism Continue Synthroid. TSH mildly low.    Uncontrolled type 2 diabetes mellitus with stage 4 chronic kidney disease, with long-term current use of insulin (HCC) Hemoglobin A1c 6.7%. Currently being managed with moderate scale SSI Q AC/HS.CBGs 117-162.    Seizure disorder, complex partial ([place]) Continue Keppra.    History of stroke Continue aspirin and risk factor modification.    CKD (chronic kidney disease) stage 4, GFR 15-29 ml/min (HCC) Creatinine 2.67 today, 3.5 on admission status post gentle hydration.    Elevated troponin No reports of chest pain or shortness of breath. No acute changes on EKG. Trend flat. Likely reflective of decreased renal clearance.    Essential hypertension, benign Continue Norvasc. Chlorthalidone on hold.    Generalized weakness/physical deconditioning Status post PT evaluation. SNF  placement recommended.   Body mass index is 25.91 kg/m.   Family Communication/Anticipated D/C date and plan/Code Status   DVT prophylaxis: Lovenox ordered. Code Status: Full Code.  Family Communication: Wife and daughter updated at the bedside. Disposition Plan: Several more days given that ERCP will not take place until 03/03/18.   Medical Consultants:    Gastroenterology  Surgery   Anti-Infectives:    Levofloxacin 3/22 >  Flagyl 3/22 >  Subjective:   Patient denies pain, nausea, shortness of breath.  He has a fistula in his right upper quadrant that has drained a large amount of bilious material and soaked through the bandages.  Objective:    Vitals:   02/27/18 0520 02/27/18 2114 02/27/18 2124 02/28/18 0450  BP: (!) 147/63 (!) 156/70  (!) 168/70  Pulse: 62 (!) 58  62  Resp: 18 16  16   Temp: 98.6 F (37 C) 98.8 F (37.1 C)  98.8 F (37.1 C)  TempSrc: Oral Oral  Oral  SpO2: 100% 100% 96% 99%  Weight: 76.9 kg (169 lb 8.5 oz)   77.3 kg (170 lb 6.7 oz)  Height:        Intake/Output Summary (Last 24 hours) at 02/28/2018 0800 Last data filed at 02/28/2018 9211 Gross per 24 hour  Intake 3080 ml  Output 2100 ml  Net 980 ml   Filed Weights   02/26/18 0508 02/27/18 0520 02/28/18 0450  Weight: 76.8 kg (169 lb 5 oz) 76.9 kg (169 lb 8.5 oz) 77.3 kg (170 lb 6.7 oz)    Exam: General: No acute distress. Cardiovascular: Heart sounds show a regular rate, and rhythm. No gallops or rubs. No murmurs. No JVD. Lungs: Clear to auscultation bilaterally with good air movement. No rales,  rhonchi or wheezes. Abdomen: Cutaneous fistula draining copious bilious material.  Nontender abdomen.  Positive bowel sounds. Neurological: Alert and oriented 2. Moves all extremities 4 with equal strength. Cranial nerves II through XII grossly intact. Skin: Warm and dry. No rashes or lesions. Extremities: No clubbing or cyanosis. No edema. Pedal pulses 2+. Psychiatric: Mood and affect  are normal. Insight and judgment are fair.   Data Reviewed:   I have personally reviewed following labs and imaging studies:  Labs: Labs show the following:   Basic Metabolic Panel: Recent Labs  Lab 02/25/18 1735 02/26/18 0614 02/27/18 0710 02/28/18 0559  NA 138 139 137 134*  K 3.6 3.9 3.7 3.8  CL 107 110 109 107  CO2 20* 18* 18* 18*  GLUCOSE 152* 97 121* 109*  BUN 52* 48* 44* 41*  CREATININE 3.54* 3.08* 2.79* 2.67*  CALCIUM 9.3 8.9 8.8* 8.4*   GFR Estimated Creatinine Clearance: 19.9 mL/min (A) (by C-G formula based on SCr of 2.67 mg/dL (H)). Liver Function Tests: Recent Labs  Lab 02/25/18 1743 02/26/18 0614 02/27/18 0710 02/28/18 0559  AST 382* 202* 134* 171*  ALT 492* 380* 278* 239*  ALKPHOS 341* 311* 280* 274*  BILITOT 1.8* 1.1 0.9 1.0  PROT 6.9 6.5 6.5 6.0*  ALBUMIN 3.3* 3.0* 2.9* 2.8*   Recent Labs  Lab 02/25/18 1743  LIPASE 25   Coagulation profile Recent Labs  Lab 02/27/18 0710  INR 1.07    CBC: Recent Labs  Lab 02/25/18 1636 02/26/18 0614 02/27/18 0710  WBC 8.0 6.0 6.3  NEUTROABS  --  4.1  --   HGB 10.8* 9.4* 9.9*  HCT 33.9* 29.5* 31.1*  MCV 96.3 96.7 96.0  PLT 218 210 215   Cardiac Enzymes: Recent Labs  Lab 02/25/18 1743 02/25/18 1934 02/25/18 2349 02/26/18 0614 02/26/18 1227  CKTOTAL  --  60  --   --   --   TROPONINI 0.06*  --  0.06* 0.06* 0.05*   CBG: Recent Labs  Lab 02/27/18 0759 02/27/18 1130 02/27/18 1643 02/27/18 2113 02/28/18 0747  GLUCAP 122* 161* 120* 162* 117*   Thyroid function studies: Recent Labs    02/26/18 0614  TSH 0.057*  Inpatient Microbiology No results found for this or any previous visit (from the past 240 hour(s)).  Procedures and diagnostic studies:  No results found.  Medications:   . amLODipine  10 mg Oral Daily  . aspirin EC  81 mg Oral Daily  . insulin aspart  0-15 Units Subcutaneous TID WC  . insulin aspart  0-5 Units Subcutaneous QHS  . levETIRAcetam  250 mg Oral QHS  .  levothyroxine  137 mcg Oral QAC breakfast  . olopatadine  1 drop Both Eyes BID  . ondansetron  4 mg Intravenous Once  . sodium chloride flush  3 mL Intravenous Q12H  . tamsulosin  0.4 mg Oral QPC supper   Continuous Infusions: . sodium chloride 75 mL/hr at 02/27/18 2052  . levofloxacin (LEVAQUIN) IV Stopped (02/27/18 2301)  . metronidazole 500 mg (02/28/18 0612)     LOS: 3 days   Jacquelynn Cree  [facility] Pager [phone]. If unable to reach me by pager, please call my cell phone at [phone].  *Please refer to [email or web address], password [email or web address] to get updated schedule on who will round on this patient, as hospitalists switch teams weekly. If 7PM-7AM, please contact night-coverage at [email or web address], password [email or web address] for any overnight needs.  02/28/2018, 8:00 AM

## 2018-02-28 NOTE — Clinical Social Work Note (Signed)
Clinical Social Work Assessment  Patient Details  Name: Chase [name] MRN: [id number] Date of Birth: 08-23-1931  Date of referral:  02/28/18               Reason for consult:  Facility Placement                Permission sought to share information with:    Permission granted to share information::     Name::        Agency::     Relationship::     Contact Information:  Wife, [name]; Dtr. [name]; were both at bedside.   Housing/Transportation Living arrangements for the past 2 months:  Single Family Home Source of Information:  Adult Children, Spouse Patient Interpreter Needed:  None Criminal Activity/Legal Involvement Pertinent to Current Situation/Hospitalization:  No - Comment as needed Significant Relationships:  Adult Children, Spouse Lives with:  Spouse Do you feel safe going back to the place where you live?  Yes Need for family participation in patient care:  Yes (Comment)  Care giving concerns:  Spouse if the primary caregiver.    Social Worker assessment / plan:  Patient ambulates with a walker and his ADLs are performed by his wife. Patient can feed himself.  He is incontinent.  [name] states that she wants patient to get back to his baseline of being able to walk with a walker.  Patient and his wife have been married 31 years and have 6 adult children.  Patient has limited use in his right arm/hand due to a stroke.   Employment status:  Retired Forensic scientist:  Medicare PT Recommendations:  [place] / Referral to community resources:  [place]  Patient/Family's Response to care:  Patient and his family are agreeable to STR at [facility].   Patient/Family's Understanding of and Emotional Response to Diagnosis, Current Treatment, and Prognosis:  Patient and family understand patient's diagnosis, treatment and prognosis.   Emotional Assessment Appearance:  Appears stated age Attitude/Demeanor/Rapport:    Affect  (typically observed):  Accepting, Calm Orientation:  Oriented to Self, Oriented to Place, Oriented to  Time, Oriented to Situation Alcohol / Substance use:  Not Applicable Psych involvement (Current and /or in the community):  No (Comment)  Discharge Needs  Concerns to be addressed:  Discharge Planning Concerns Readmission within the last 30 days:  No Current discharge risk:  None Barriers to Discharge:  No Barriers Identified   [name], LCSW 02/28/2018, 5:09 PM

## 2018-03-01 ENCOUNTER — Encounter (HOSPITAL_COMMUNITY): Payer: Self-pay | Admitting: Internal Medicine

## 2018-03-01 DIAGNOSIS — E872 Acidosis, unspecified: Secondary | ICD-10-CM | POA: Diagnosis present

## 2018-03-01 DIAGNOSIS — K823 Fistula of gallbladder: Secondary | ICD-10-CM

## 2018-03-01 LAB — COMPREHENSIVE METABOLIC PANEL
ALT: 181 U/L — ABNORMAL HIGH (ref 17–63)
AST: 94 U/L — ABNORMAL HIGH (ref 15–41)
Albumin: 3 g/dL — ABNORMAL LOW (ref 3.5–5.0)
Alkaline Phosphatase: 251 U/L — ABNORMAL HIGH (ref 38–126)
Anion gap: 11 (ref 5–15)
BUN: 37 mg/dL — ABNORMAL HIGH (ref 6–20)
CO2: 18 mmol/L — ABNORMAL LOW (ref 22–32)
Calcium: 8.6 mg/dL — ABNORMAL LOW (ref 8.9–10.3)
Chloride: 107 mmol/L (ref 101–111)
Creatinine, Ser: 2.72 mg/dL — ABNORMAL HIGH (ref 0.61–1.24)
GFR calc Af Amer: 23 mL/min — ABNORMAL LOW (ref >60)
GFR calc non Af Amer: 20 mL/min — ABNORMAL LOW (ref >60)
Glucose, Bld: 101 mg/dL — ABNORMAL HIGH (ref 65–99)
Potassium: 3.8 mmol/L (ref 3.5–5.1)
Sodium: 136 mmol/L (ref 135–145)
Total Bilirubin: 1.1 mg/dL (ref 0.3–1.2)
Total Protein: 6.4 g/dL — ABNORMAL LOW (ref 6.5–8.1)

## 2018-03-01 LAB — GLUCOSE, CAPILLARY
Glucose-Capillary: 110 mg/dL — ABNORMAL HIGH (ref 65–99)
Glucose-Capillary: 175 mg/dL — ABNORMAL HIGH (ref 65–99)
Glucose-Capillary: 94 mg/dL (ref 65–99)
Glucose-Capillary: 106 mg/dL — ABNORMAL HIGH (ref 65–99)

## 2018-03-01 LAB — T4, FREE: Free T4: 1.59 ng/dL — ABNORMAL HIGH (ref 0.61–1.12)

## 2018-03-01 NOTE — Progress Notes (Signed)
Progress Note    Chase [name]  RXV:[id number] DOB: 04-18-1931  DOA: 02/25/2018 PCP: [name], MD    Brief Narrative:   Chief complaint: Follow-up weakness and abdominal pain.  Medical records reviewed and are as summarized below:  Chase [name] is an 84 y.o. male with a PMH of CVA, stage IV CK D, hypothyroidism, seizure disorder, hypertension, insulin-dependent diabetes who was admitted 02/25/18 for evaluation of generalized weakness associated with abdominal pain. Found to have evidence of cholelithiasis and choledocholithiasis. gI following with possible plans for ERCP.  Assessment/Plan:   Principal Problem:   Cholelithiasis and choledocholithiasis/elevated LFTs GI following with plans for ERCP on 03/02/18. Continue Levaquin and metronidazole. Plavix on hold in anticipation of procedure. LFTs continue to trend down.  Active Problems:   Sinus tract/fistula from prior cholecystostomy tube Draining a large amount of bilious material. Followed by Dr. Grandville Silos as an outpatient. Requires intermittent cauterization.  Hopefully will heal once bile duct cleared of stones and biliary obstruction resolved.    Hypothyroidism Continue Synthroid. TSH mildly low. Check free T4. May need adjustment of Synthroid dose if over replaced.    Uncontrolled type 2 diabetes mellitus with stage 4 chronic kidney disease, with long-term current use of insulin (HCC) Hemoglobin A1c 6.7%. Currently being managed with moderate scale SSI Q AC/HS.CBGs 87-162.    Seizure disorder, complex partial ([place]) Continue Keppra. No seizures.    History of stroke Continue risk factor modification. Resume antiplatelet therapy post-procedure.    CKD (chronic kidney disease) stage 4, GFR 15-29 ml/min (HCC) with metabolic acidosis and anemia of CKD Creatinine 2.72 today, 3.5 on admission status post gentle hydration. Likely at baseline now.    Elevated troponin No reports of chest pain or shortness of  breath. No acute changes on EKG. Trend flat. Likely reflective of decreased renal clearance.    Essential hypertension, benign Continue Norvasc. Chlorthalidone on hold.    Generalized weakness/physical deconditioning Status post PT evaluation. SNF placement recommended.   Body mass index is 25.98 kg/m.   Family Communication/Anticipated D/C date and plan/Code Status   DVT prophylaxis: Lovenox ordered. Code Status: Full Code.  Family Communication: Wife and daughter updated at the bedside. Disposition Plan: Several more days given that ERCP will not take place until 03/03/18.   Medical Consultants:    Gastroenterology  Surgery   Anti-Infectives:    Levofloxacin 3/22 >  Flagyl 3/22 >  Subjective:   Patient denies pain, nausea, shortness of breath.  He has a fistula in his right upper quadrant that has drained a large amount of bilious material and soaked through the bandages.  Objective:    Vitals:   02/28/18 1900 02/28/18 2018 02/28/18 2045 03/01/18 0524  BP: (!) 142/61  (!) 141/70 132/77  Pulse: (!) 57  (!) 58 63  Resp: 16     Temp: 98.5 F (36.9 C)  98.3 F (36.8 C) 98.2 F (36.8 C)  TempSrc: Oral  Oral Oral  SpO2: 100% 98% 100% 100%  Weight:    77.5 kg (170 lb 13.7 oz)  Height:        Intake/Output Summary (Last 24 hours) at 03/01/2018 0730 Last data filed at 03/01/2018 0610 Gross per 24 hour  Intake 3060 ml  Output 2300 ml  Net 760 ml   Filed Weights   02/27/18 0520 02/28/18 0450 03/01/18 0524  Weight: 76.9 kg (169 lb 8.5 oz) 77.3 kg (170 lb 6.7 oz) 77.5 kg (170 lb 13.7 oz)  Exam: General: No acute distress. Cardiovascular: Heart sounds show a regular rate, and rhythm. No gallops or rubs. No murmurs. No JVD. Lungs: Clear to auscultation bilaterally with good air movement. No rales, rhonchi or wheezes. Abdomen: Soft, nontender, nondistended with normal active bowel sounds. No masses. No hepatosplenomegaly. Right upper quadrant fistula/sinus  tract continues to drain large amount of bilious fluid. Skin: Warm and dry. No rashes or lesions. Extremities: No clubbing or cyanosis. No edema. Pedal pulses 2+.   Data Reviewed:   I have personally reviewed following labs and imaging studies:  Labs: Labs show the following:   Basic Metabolic Panel: Recent Labs  Lab 02/25/18 1735 02/26/18 0614 02/27/18 0710 02/28/18 0559 03/01/18 0522  NA 138 139 137 134* 136  K 3.6 3.9 3.7 3.8 3.8  CL 107 110 109 107 107  CO2 20* 18* 18* 18* 18*  GLUCOSE 152* 97 121* 109* 101*  BUN 52* 48* 44* 41* 37*  CREATININE 3.54* 3.08* 2.79* 2.67* 2.72*  CALCIUM 9.3 8.9 8.8* 8.4* 8.6*   GFR Estimated Creatinine Clearance: 19.6 mL/min (A) (by C-G formula based on SCr of 2.72 mg/dL (H)). Liver Function Tests: Recent Labs  Lab 02/25/18 1743 02/26/18 0614 02/27/18 0710 02/28/18 0559 03/01/18 0522  AST 382* 202* 134* 171* 94*  ALT 492* 380* 278* 239* 181*  ALKPHOS 341* 311* 280* 274* 251*  BILITOT 1.8* 1.1 0.9 1.0 1.1  PROT 6.9 6.5 6.5 6.0* 6.4*  ALBUMIN 3.3* 3.0* 2.9* 2.8* 3.0*   Recent Labs  Lab 02/25/18 1743  LIPASE 25   Coagulation profile Recent Labs  Lab 02/27/18 0710  INR 1.07    CBC: Recent Labs  Lab 02/25/18 1636 02/26/18 0614 02/27/18 0710  WBC 8.0 6.0 6.3  NEUTROABS  --  4.1  --   HGB 10.8* 9.4* 9.9*  HCT 33.9* 29.5* 31.1*  MCV 96.3 96.7 96.0  PLT 218 210 215   Cardiac Enzymes: Recent Labs  Lab 02/25/18 1743 02/25/18 1934 02/25/18 2349 02/26/18 0614 02/26/18 1227  CKTOTAL  --  60  --   --   --   TROPONINI 0.06*  --  0.06* 0.06* 0.05*   CBG: Recent Labs  Lab 02/27/18 2113 02/28/18 0747 02/28/18 1109 02/28/18 1632 02/28/18 2044  GLUCAP 162* 117* 155* 87 155*   Thyroid function studies: No results for input(s): TSH, T4TOTAL, T3FREE, THYROIDAB in the last 72 hours.  Invalid input(s): FREET3Inpatient Microbiology No results found for this or any previous visit (from the past 240  hour(s)).  Procedures and diagnostic studies:  No results found.  Medications:   . amLODipine  10 mg Oral Daily  . insulin aspart  0-15 Units Subcutaneous TID WC  . insulin aspart  0-5 Units Subcutaneous QHS  . levETIRAcetam  250 mg Oral QHS  . levothyroxine  137 mcg Oral QAC breakfast  . olopatadine  1 drop Both Eyes BID  . ondansetron  4 mg Intravenous Once  . sodium chloride flush  3 mL Intravenous Q12H  . tamsulosin  0.4 mg Oral QPC supper   Continuous Infusions: . sodium chloride 75 mL/hr at 03/01/18 0606  . levofloxacin (LEVAQUIN) IV Stopped (02/27/18 2301)  . metronidazole 500 mg (03/01/18 0604)     LOS: 4 days   Jacquelynn Cree  [facility] Pager [phone]. If unable to reach me by pager, please call my cell phone at [phone].  *Please refer to [email or web address], password [email or web address] to get updated schedule on who will round on this patient,  as hospitalists switch teams weekly. If 7PM-7AM, please contact night-coverage at [email or web address], password [email or web address] for any overnight needs.  03/01/2018, 7:30 AM

## 2018-03-01 NOTE — Consult Note (Signed)
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 Rockne Menghini   Chief Complaint    Fatigue      Chase [name] is a 84 y.o. male.  HPI: Chase [name] is a 84 yo who has a history of acute cholecystitis in 11/2014 s/p cholecystectomy tube due to his co-morbidities. He was initially seen at [facility] by Dr. Arnoldo Morale, transferred to main [facility] and attempts to get him through his acute cholecystitis with IV antibiotics and then subsequent cholecystostomy tube placement. The documentation from [facility] as outpatient is not in the Epic system, but the wife reports that he had the tube in for a few months. They followed up with [facility] and decided that the risk of cholecystectomy outweighed the benefits and the cholecystostomy tube was removed. Since that time, he has had some intermittent drainage of bile from the skin track. He goes for weeks without any issues and the area heals then opens back up again.  He was recently admitted to the hospital with some generalized weakness and abdominal pain. His CT scan demonstrated the cholecystocutaneous fistula, which is chronic and also concern for a CBD stone.  He has had known gallstones in the past.  He has been seen by Dr. Laural Golden and there are plans for ERCP tomorrow after discussion with anesthesia over the weekend.  Since his admission, he has had substantially more biliary drainage from the fistula.   Before the admission the drainage was minimal and did not cause a problem in day to day life or cause any skin break down issue.   Past Medical History:  Diagnosis Date  . Bradycardia 03/15/2012  . Carotid artery occlusion   . Choledocholithiasis 02/25/2018  . CKD (chronic kidney disease) stage 4, GFR 15-29 ml/min (HCC)   . Complete lesion of cervical spinal cord ([place]) 03/14/3012   Stable since 2006  . CVA (cerebrovascular accident) ([place]) 09/07/12  . Depressive  disorder, not elsewhere classified   . Diabetes mellitus approx 1994  . Diabetic neuropathy ([place])   . Hypertensive heart disease   . Hypothyroidism approx 2000  . Lacunar stroke, acute ([place]) 03/14/2012  . Obesity   . Osteoarthrosis, unspecified whether generalized or localized, lower leg   . Other and unspecified hyperlipidemia   . Peripheral vascular disease, unspecified ([place])   . Seizures ([place])   . Spinal stenosis, unspecified region other than cervical   . Spondylosis of unspecified site without mention of myelopathy     Past Surgical History:  Procedure Laterality Date  . COLONOSCOPY N/A 08/10/2013   Procedure: COLONOSCOPY;  Surgeon: Rogene Houston, MD;  Location: [facility] ENDO SUITE;  Service: Endoscopy;  Laterality: N/A;  240  . kidney stones left x2  1975  . KIDNEY SURGERY     Ruptured left kidney 30 yrs ago  from a kidney stone    Family History  Problem Relation Age of Onset  . Diabetes Mother   . Prostate cancer Father   . Hypertension Brother   . Hypertension Brother   . Diabetes Brother   . Stroke Brother   . Diabetes Brother   . Diabetes Daughter   . Diabetes Daughter     Social History   Tobacco Use  . Smoking status: Never Smoker  . Smokeless tobacco: Never Used  Substance Use Topics  . Alcohol use: No    Alcohol/week: 0.0 oz  . Drug use: No    Medications:  I  have reviewed the patient's current medications. Prior to Admission:  Medications Prior to Admission  Medication Sig Dispense Refill Last Dose  . amLODipine (NORVASC) 10 MG tablet TAKE 1 TABLET BY MOUTH ONCE DAILY 90 tablet 1 02/25/2018 at Unknown time  . aspirin EC 81 MG tablet Take 1 tablet (81 mg total) by mouth daily. 150 tablet 2 02/25/2018 at Unknown time  . chlorthalidone (HYGROTON) 50 MG tablet TAKE 1 TABLET BY MOUTH ONCE DAILY 90 tablet 1 02/25/2018 at Unknown time  . clobetasol cream (TEMOVATE) 0.05 %    02/25/2018 at Unknown time  . clopidogrel (PLAVIX) 75 MG tablet TAKE 1 TABLET BY MOUTH  ONCE DAILY 30 tablet 1 02/25/2018 at 1330  . desonide (DESOWEN) 0.05 % cream    02/25/2018 at Unknown time  . glucose blood (ONE TOUCH ULTRA TEST) test strip USE 1 STRIP TO CHECK GLUCOSE UP TO TWICE DAILY E11.65 100 each 5 Taking  . Insulin Isophane & Regular Human (NOVOLIN 70/30 FLEXPEN RELION) (70-30) 100 UNIT/ML PEN Inject 10 Units into the skin 2 (two) times daily before a meal. 5 pen 2 02/25/2018 at Unknown time  . levETIRAcetam (KEPPRA) 500 MG tablet Take 250 mg by mouth at bedtime.    02/25/2018 at Unknown time  . levothyroxine (SYNTHROID, LEVOTHROID) 137 MCG tablet Take 1 tablet (137 mcg total) by mouth every morning. 90 tablet 3 02/25/2018 at Unknown time  . Multiple Vitamins-Minerals (EYE VITAMINS PO) Take 1 tablet by mouth daily.    02/25/2018 at Unknown time  . olopatadine (PATANOL) 0.1 % ophthalmic solution Place 1 drop into both eyes 2 (two) times daily. 5 mL 12 02/25/2018 at Unknown time  . ONE TOUCH ULTRA TEST test strip USE 1 STRIP TO CHECK GLUCOSE TWICE DAILY 100 each 5 Taking  . ONETOUCH DELICA LANCETS 45W MISC    Taking  . tamsulosin (FLOMAX) 0.4 MG CAPS capsule Take 0.4 mg by mouth.   02/25/2018 at Unknown time  . Vitamin D, Ergocalciferol, (DRISDOL) 50000 units CAPS capsule TAKE 1 CAPSULE BY MOUTH ONCE A WEEK 12 capsule 1 02/25/2018 at Unknown time   Scheduled: . amLODipine  10 mg Oral Daily  . insulin aspart  0-15 Units Subcutaneous TID WC  . insulin aspart  0-5 Units Subcutaneous QHS  . levETIRAcetam  250 mg Oral QHS  . levothyroxine  137 mcg Oral QAC breakfast  . olopatadine  1 drop Both Eyes BID  . ondansetron  4 mg Intravenous Once  . sodium chloride flush  3 mL Intravenous Q12H  . tamsulosin  0.4 mg Oral QPC supper   Continuous: . sodium chloride 75 mL/hr at 03/01/18 0606  . levofloxacin (LEVAQUIN) IV Stopped (02/27/18 2301)  . metronidazole 500 mg (03/01/18 1414)   TUU:[id number] **OR** acetaminophen, fentaNYL (SUBLIMAZE) injection, ondansetron **OR** ondansetron  (ZOFRAN) IV  Allergies: Allergies  Allergen Reactions  . Bayer Advanced Aspirin [Aspirin] Nausea And Vomiting  . Penicillins Nausea And Vomiting    Has patient had a PCN reaction causing immediate rash, facial/tongue/throat swelling, SOB or lightheadedness with hypotension: unknown Has patient had a PCN reaction causing severe rash involving mucus membranes or skin necrosis: unknown Has patient had a PCN reaction that required hospitalization: unknown Has patient had a PCN reaction occurring within the last 10 years: no If all of the above answers are "NO", then may proceed with Cephalosporin use.     ROS:  A comprehensive review of systems was negative except for: Gastrointestinal: positive for abdominal pain and chronic draining  cholecystocutaneous fistula  Blood pressure 132/77, pulse 63, temperature 98.2 F (36.8 C), temperature source Oral, resp. rate 16, height '5\' 8"'  (1.727 m), weight 170 lb 13.7 oz (77.5 kg), SpO2 100 %. Physical Exam  Constitutional: He is well-developed, well-nourished, and in no distress.  HENT:  Head: Normocephalic.  Eyes: Pupils are equal, round, and reactive to light.  Neck: Normal range of motion.  Cardiovascular: Normal rate.  Pulmonary/Chest: Effort normal.  Abdominal: Soft. He exhibits no distension.  Mild RUQ tenderness, bilious drainage on gauze, small punctate epithelized area on skin  Musculoskeletal: He exhibits no edema.  Neurological: He is alert.  Skin: Skin is warm and dry.  Psychiatric: Mood, memory, affect and judgment normal.  Vitals reviewed.   Results: Results for orders placed or performed during the hospital encounter of 02/25/18 (from the past 48 hour(s))  Glucose, capillary     Status: Abnormal   Collection Time: 02/27/18  4:43 PM  Result Value Ref Range   Glucose-Capillary 120 (H) 65 - 99 mg/dL   Comment 1 Notify RN    Comment 2 Document in Chart   Glucose, capillary     Status: Abnormal   Collection Time: 02/27/18   9:13 PM  Result Value Ref Range   Glucose-Capillary 162 (H) 65 - 99 mg/dL   Comment 1 Notify RN    Comment 2 Document in Chart   Comprehensive metabolic panel     Status: Abnormal   Collection Time: 02/28/18  5:59 AM  Result Value Ref Range   Sodium 134 (L) 135 - 145 mmol/L   Potassium 3.8 3.5 - 5.1 mmol/L   Chloride 107 101 - 111 mmol/L   CO2 18 (L) 22 - 32 mmol/L   Glucose, Bld 109 (H) 65 - 99 mg/dL   BUN 41 (H) 6 - 20 mg/dL   Creatinine, Ser 2.67 (H) 0.61 - 1.24 mg/dL   Calcium 8.4 (L) 8.9 - 10.3 mg/dL   Total Protein 6.0 (L) 6.5 - 8.1 g/dL   Albumin 2.8 (L) 3.5 - 5.0 g/dL   AST 171 (H) 15 - 41 U/L   ALT 239 (H) 17 - 63 U/L   Alkaline Phosphatase 274 (H) 38 - 126 U/L   Total Bilirubin 1.0 0.3 - 1.2 mg/dL   GFR calc non Af Amer 20 (L) >60 mL/min   GFR calc Af Amer 24 (L) >60 mL/min    Comment: (NOTE) The eGFR has been calculated using the CKD EPI equation. This calculation has not been validated in all clinical situations. eGFR's persistently <60 mL/min signify possible Chronic Kidney Disease.    Anion gap 9 5 - 15    Comment: Performed at [facility], [address] [place], [place] [address]  Glucose, capillary     Status: Abnormal   Collection Time: 02/28/18  7:47 AM  Result Value Ref Range   Glucose-Capillary 117 (H) 65 - 99 mg/dL  Glucose, capillary     Status: Abnormal   Collection Time: 02/28/18 11:09 AM  Result Value Ref Range   Glucose-Capillary 155 (H) 65 - 99 mg/dL  Glucose, capillary     Status: None   Collection Time: 02/28/18  4:32 PM  Result Value Ref Range   Glucose-Capillary 87 65 - 99 mg/dL  Glucose, capillary     Status: Abnormal   Collection Time: 02/28/18  8:44 PM  Result Value Ref Range   Glucose-Capillary 155 (H) 65 - 99 mg/dL   Comment 1 Notify RN    Comment  2 Document in Chart   Comprehensive metabolic panel     Status: Abnormal   Collection Time: 03/01/18  5:22 AM  Result Value Ref Range   Sodium 136 135 - 145 mmol/L   Potassium 3.8  3.5 - 5.1 mmol/L   Chloride 107 101 - 111 mmol/L   CO2 18 (L) 22 - 32 mmol/L   Glucose, Bld 101 (H) 65 - 99 mg/dL   BUN 37 (H) 6 - 20 mg/dL   Creatinine, Ser 2.72 (H) 0.61 - 1.24 mg/dL   Calcium 8.6 (L) 8.9 - 10.3 mg/dL   Total Protein 6.4 (L) 6.5 - 8.1 g/dL   Albumin 3.0 (L) 3.5 - 5.0 g/dL   AST 94 (H) 15 - 41 U/L   ALT 181 (H) 17 - 63 U/L   Alkaline Phosphatase 251 (H) 38 - 126 U/L   Total Bilirubin 1.1 0.3 - 1.2 mg/dL   GFR calc non Af Amer 20 (L) >60 mL/min   GFR calc Af Amer 23 (L) >60 mL/min    Comment: (NOTE) The eGFR has been calculated using the CKD EPI equation. This calculation has not been validated in all clinical situations. eGFR's persistently <60 mL/min signify possible Chronic Kidney Disease.    Anion gap 11 5 - 15    Comment: Performed at [facility], [address], [place], [place] [address]  Glucose, capillary     Status: Abnormal   Collection Time: 03/01/18  7:37 AM  Result Value Ref Range   Glucose-Capillary 110 (H) 65 - 99 mg/dL   Comment 1 Notify RN    Comment 2 Document in Chart   T4, free     Status: Abnormal   Collection Time: 03/01/18  8:27 AM  Result Value Ref Range   Free T4 1.59 (H) 0.61 - 1.12 ng/dL    Comment: (NOTE) Biotin ingestion may interfere with free T4 tests. If the results are inconsistent with the TSH level, previous test results, or the clinical presentation, then consider biotin interference. If needed, order repeat testing after stopping biotin. Performed at [facility], [place] [address] [place], [place] [address]   Glucose, capillary     Status: Abnormal   Collection Time: 03/01/18 12:15 PM  Result Value Ref Range   Glucose-Capillary 175 (H) 65 - 99 mg/dL   Comment 1 Notify RN    Comment 2 Document in Chart    Personally reviewed- hazy track that is between gallbladder and skin, very short distance, stone calcified and in CBD   CT a/p  IMPRESSION: 1. Multiple calcified gallstones. Linear soft tissue density at  the right abdominal skin surface coursing toward the fundus of the gallbladder, felt related to prior cholecystostomy tube placement. 9 mm stone in the distal common bile duct with mild extrahepatic biliary dilatation and slight central intra hepatic biliary dilatation. 2. Similar appearance of dilated extrarenal pelvises but without ureteral stone. Slightly thick-walled appearance of the bladder 3. Sigmoid colon diverticular disease without acute inflammation.  Assessment & Plan:  CAREEM YASUI is a 84 y.o. male with a chronic cholecystocutaneous fistula after a cholecystostomy tube and current choledocholithiasis that is making his output substantially more than it was previously. He otherwise prior to this admission had minimal issues with the drainage that was intermittent. In all reality, the fistula is what is decompressing the biliary system and keeping him from being sick, jaundice from the choledocholithiasis and is a good thing in this situation.   Discussed the overall  anatomy of the biliary system and what is going on and the fistula. Discussed that this is not life threatening and might be a nuances but at their normal baseline this is barely a problem given the limited drainage.  Discussed that the drainage would likely go back down once the stone was removed and potentially he might go back to his baseline of intermittent drainage versus be able to heal the fistula. Likely given the timing the fistula is chronic, but again is not life threatening and the risk of cholecystectomy or even something more simple like attempted closure of the fistula track have to be weighed the potential benefit.  At this time I see no role for any surgical intervention for this and it would actually make him sicker before the stone was removed.  Family has an appt as outpatient with [facility] already. I imagine they will tell them the same thing.  We discussed that cholecystectomy after  cholecystostomy tubes are very difficult and have risks and also discussed closure of the track would likely result in recurrence. Also discussed leaving things like they are and doing dressing changes versus wound managers to control the drainage if it continues to be so high. I suspect after the ERCP it will go down.   No need for follow up with me as they already have surgical follow up per their report with [facility].   All questions were answered to the satisfaction of the patient and family.   Chase [name] 03/01/2018, 2:20 PM

## 2018-03-01 NOTE — Progress Notes (Signed)
Pharmacy Antibiotic Note  Chase [name] is a 84 y.o. male admitted on 02/25/2018 with intra-abominal infection.  Pharmacy has been consulted for Levaquin dosing.  Plan: Continue Levaquin 750 mg IV Q 48 hours.  Monitor labs, c/s, and clinical improvement  Height: 5\' 8"  (172.7 cm) Weight: 170 lb 13.7 oz (77.5 kg) IBW/kg (Calculated) : 68.4  Temp (24hrs), Avg:98.3 F (36.8 C), Min:98.2 F (36.8 C), Max:98.5 F (36.9 C)  Recent Labs  Lab 02/25/18 1636 02/25/18 1735 02/26/18 0614 02/27/18 0710 02/28/18 0559 03/01/18 0522  WBC 8.0  --  6.0 6.3  --   --   CREATININE  --  3.54* 3.08* 2.79* 2.67* 2.72*    Estimated Creatinine Clearance: 19.6 mL/min (A) (by C-G formula based on SCr of 2.72 mg/dL (H)).    Allergies  Allergen Reactions  . Bayer Advanced Aspirin [Aspirin] Nausea And Vomiting  . Penicillins Nausea And Vomiting    Has patient had a PCN reaction causing immediate rash, facial/tongue/throat swelling, SOB or lightheadedness with hypotension: unknown Has patient had a PCN reaction causing severe rash involving mucus membranes or skin necrosis: unknown Has patient had a PCN reaction that required hospitalization: unknown Has patient had a PCN reaction occurring within the last 10 years: no If all of the above answers are "NO", then may proceed with Cephalosporin use.    Antimicrobials this admission: Levaquin 3/22 >>    Dose adjustments this admission: Levaquin Q 48 hours  Microbiology results: N/A  Thank you for allowing pharmacy to be a part of this patient's care.  Margot Ables, PharmD Clinical Pharmacist 03/01/2018 11:17 AM

## 2018-03-01 NOTE — Clinical Social Work Note (Signed)
CSW following. Pt offered beds at [place]. Discussed with pt and family. They accept bed offer at [facility]. Updated Mardene Celeste at [facility]. Pt to have ERCP tomorrow. DC not yet known. LCSW will follow.

## 2018-03-01 NOTE — Progress Notes (Signed)
  Subjective:  Patient continues to complain of pain around site of prior cholecystostomy tube placement(December 03/09/2014).  He states the drainage has not slowed down.  Denies epigastric pain nausea or vomiting.  Objective: Blood pressure 132/77, pulse 63, temperature 98.2 F (36.8 C), temperature source Oral, resp. rate 16, height 5\' 8"  (1.727 m), weight 170 lb 13.7 oz (77.5 kg), SpO2 100 %. Patient is alert and in no acute distress. He has thick dressing over site of biliary fistula.  Dressing is soaked in bile.  Abdomen is soft without epigastric tenderness.   Labs/studies Results:  Recent Labs    2018/03/09 0710  WBC 6.3  HGB 9.9*  HCT 31.1*  PLT 215    BMET  Recent Labs    03/09/2018 0710 02/28/18 0559 03/01/18 0522  NA 137 134* 136  K 3.7 3.8 3.8  CL 109 107 107  CO2 18* 18* 18*  GLUCOSE 121* 109* 101*  BUN 44* 41* 37*  CREATININE 2.79* 2.67* 2.72*  CALCIUM 8.8* 8.4* 8.6*    LFT  Recent Labs    03/09/2018 0710 02/28/18 0559 03/01/18 0522  PROT 6.5 6.0* 6.4*  ALBUMIN 2.9* 2.8* 3.0*  AST 134* 171* 94*  ALT 278* 239* 181*  ALKPHOS 280* 274* 251*  BILITOT 0.9 1.0 1.1    PT/INR  Recent Labs    03/09/18 0710  LABPROT 13.8  INR 1.07     Assessment:  #1.  Choledocholithiasis resulting in opening of biliary fistula that resulted from cholecystostomy over 3 years ago.  Biliary fistula would not heal unless bile duct is cleared of stone/stones. ERCP delayed because patient was on clopidogrel. Patient is high risk for ERCP he has been evaluated by anesthesia and will undergo procedure in a.m.  And does not appear to have acute cholangitis. Patient is on Levaquin and metronidazole.  #2.  Anemia appears to be due to chronic disease.  Recommendations:  ERCP with sphincterotomy and stone extraction on 03/02/2018.

## 2018-03-01 NOTE — Plan of Care (Signed)
End of shift summary: Patient remains alert and oriented. His vitals remain stable. He received three dressing changes this shift and he tolerated them well. Patient required prn pain medication once this shift for pain in the chest and abdomen. He has not had any adverse reactions to medications. His appetite has been poor. Patient ate 25% of dinner and lunch. He has been voiding appropriately. His mobility has been very limited, but he is able to turn himself. No signs and symptoms of distress noted. No complaints noted.

## 2018-03-02 ENCOUNTER — Encounter (HOSPITAL_COMMUNITY): Payer: Self-pay | Admitting: *Deleted

## 2018-03-02 ENCOUNTER — Inpatient Hospital Stay (HOSPITAL_COMMUNITY): Payer: Medicare Other

## 2018-03-02 ENCOUNTER — Inpatient Hospital Stay (HOSPITAL_COMMUNITY): Payer: Medicare Other | Admitting: Anesthesiology

## 2018-03-02 ENCOUNTER — Encounter (HOSPITAL_COMMUNITY)
Admission: EM | Disposition: A | Discharge status: Skilled Nursing Facility | Payer: Self-pay | Source: Home / Self Care | Attending: Internal Medicine

## 2018-03-02 DIAGNOSIS — K831 Obstruction of bile duct: Secondary | ICD-10-CM

## 2018-03-02 HISTORY — PX: ERCP: SHX5425

## 2018-03-02 LAB — GLUCOSE, CAPILLARY
Glucose-Capillary: 97 mg/dL (ref 65–99)
Glucose-Capillary: 108 mg/dL — ABNORMAL HIGH (ref 65–99)
Glucose-Capillary: 162 mg/dL — ABNORMAL HIGH (ref 65–99)
Glucose-Capillary: 127 mg/dL — ABNORMAL HIGH (ref 65–99)
Glucose-Capillary: 191 mg/dL — ABNORMAL HIGH (ref 65–99)

## 2018-03-02 SURGERY — ERCP, WITH INTERVENTION IF INDICATED
Anesthesia: General

## 2018-03-02 MED ORDER — GLUCAGON HCL RDNA (DIAGNOSTIC) 1 MG IJ SOLR
INTRAMUSCULAR | Status: AC
  Filled 2018-03-02: qty 2

## 2018-03-02 MED ORDER — FENTANYL CITRATE (PF) 100 MCG/2ML IJ SOLN
25.0000 ug | INTRAMUSCULAR | Status: DC | PRN
  Administered 2018-03-02: 50 ug via INTRAVENOUS

## 2018-03-02 MED ORDER — MIDAZOLAM HCL 2 MG/2ML IJ SOLN
1.0000 mg | INTRAMUSCULAR | Status: DC
  Administered 2018-03-02: 2 mg via INTRAVENOUS

## 2018-03-02 MED ORDER — ONDANSETRON HCL 4 MG/2ML IJ SOLN
INTRAMUSCULAR | Status: AC
  Filled 2018-03-02: qty 2

## 2018-03-02 MED ORDER — GLYCOPYRROLATE 0.2 MG/ML IJ SOLN
INTRAMUSCULAR | Status: AC
Start: 2018-03-02 — End: ?
  Filled 2018-03-02: qty 6

## 2018-03-02 MED ORDER — FENTANYL CITRATE (PF) 100 MCG/2ML IJ SOLN
INTRAMUSCULAR | Status: AC
  Filled 2018-03-02: qty 2

## 2018-03-02 MED ORDER — NEOSTIGMINE METHYLSULFATE 10 MG/10ML IV SOLN
INTRAVENOUS | Status: DC | PRN
  Administered 2018-03-02: 3 mg via INTRAVENOUS

## 2018-03-02 MED ORDER — STERILE WATER FOR IRRIGATION IR SOLN
Status: DC | PRN
  Administered 2018-03-02: 5 mL

## 2018-03-02 MED ORDER — GLUCAGON HCL RDNA (DIAGNOSTIC) 1 MG IJ SOLR
INTRAMUSCULAR | Status: DC | PRN
  Administered 2018-03-02: 0.25 mg via INTRAVENOUS
  Administered 2018-03-02: .25 mg via INTRAVENOUS

## 2018-03-02 MED ORDER — GLYCOPYRROLATE 0.2 MG/ML IJ SOLN
INTRAMUSCULAR | Status: DC | PRN
  Administered 2018-03-02: .6 mg via INTRAVENOUS

## 2018-03-02 MED ORDER — ROCURONIUM BROMIDE 50 MG/5ML IV SOLN
INTRAVENOUS | Status: AC
  Filled 2018-03-02: qty 2

## 2018-03-02 MED ORDER — NEOSTIGMINE METHYLSULFATE 10 MG/10ML IV SOLN
INTRAVENOUS | Status: AC
Start: 2018-03-02 — End: ?
  Filled 2018-03-02: qty 2

## 2018-03-02 MED ORDER — ETOMIDATE 2 MG/ML IV SOLN
INTRAVENOUS | Status: DC | PRN
  Administered 2018-03-02: 14 mg via INTRAVENOUS

## 2018-03-02 MED ORDER — IOPAMIDOL (ISOVUE-300) INJECTION 61%
INTRAVENOUS | Status: AC
  Filled 2018-03-02: qty 100

## 2018-03-02 MED ORDER — ETOMIDATE 2 MG/ML IV SOLN
INTRAVENOUS | Status: AC
  Filled 2018-03-02: qty 10

## 2018-03-02 MED ORDER — FENTANYL CITRATE (PF) 100 MCG/2ML IJ SOLN
INTRAMUSCULAR | Status: DC | PRN
  Administered 2018-03-02: 50 ug via INTRAVENOUS
  Administered 2018-03-02: 25 ug via INTRAVENOUS

## 2018-03-02 MED ORDER — MIDAZOLAM HCL 2 MG/2ML IJ SOLN
INTRAMUSCULAR | Status: AC
  Filled 2018-03-02: qty 2

## 2018-03-02 MED ORDER — SUCCINYLCHOLINE CHLORIDE 20 MG/ML IJ SOLN
INTRAMUSCULAR | Status: AC
Start: 2018-03-02 — End: ?
  Filled 2018-03-02: qty 1

## 2018-03-02 MED ORDER — LEVOFLOXACIN IN D5W 500 MG/100ML IV SOLN: 500.0000 mg | INTRAVENOUS | Status: DC

## 2018-03-02 MED ORDER — SODIUM CHLORIDE 0.45 % IV SOLN
INTRAVENOUS | Status: AC
  Administered 2018-03-02 – 2018-03-03 (×2): via INTRAVENOUS

## 2018-03-02 MED ORDER — SODIUM CHLORIDE 0.9 % IV SOLN: INTRAVENOUS | Status: DC

## 2018-03-02 MED ORDER — IOPAMIDOL (ISOVUE-M 300) INJECTION 61%
INTRAMUSCULAR | Status: DC | PRN
  Administered 2018-03-02: 40 mL

## 2018-03-02 MED ORDER — ROCURONIUM 10MG/ML (10ML) SYRINGE FOR MEDFUSION PUMP - OPTIME
INTRAVENOUS | Status: DC | PRN
  Administered 2018-03-02: 40 mg via INTRAVENOUS

## 2018-03-02 MED ORDER — LACTATED RINGERS IV SOLN
INTRAVENOUS | Status: DC
  Administered 2018-03-02: 12:00:00 via INTRAVENOUS

## 2018-03-02 MED ORDER — LIDOCAINE HCL (CARDIAC) 10 MG/ML IV SOLN
INTRAVENOUS | Status: DC | PRN
  Administered 2018-03-02: 40 mg via INTRAVENOUS

## 2018-03-02 NOTE — Transfer of Care (Signed)
Immediate Anesthesia Transfer of Care Note  Patient: Chase [name]  Procedure(s) Performed: ENDOSCOPIC RETROGRADE CHOLANGIOPANCREATOGRAPHY (ERCP) With sphincterotomy and stent placement (N/A )  Patient Location: PACU  Anesthesia Type:General  Level of Consciousness: awake  Airway & Oxygen Therapy: Patient Spontanous Breathing and Patient connected to face mask oxygen  Post-op Assessment: Report given to RN  Post vital signs: Reviewed and stable  Last Vitals:  Vitals Value Taken Time  BP 149/64 03/02/2018  2:15 PM  Temp 36.4 C 03/02/2018  2:14 PM  Pulse 66 03/02/2018  2:16 PM  Resp 18 03/02/2018  2:16 PM  SpO2 100 % 03/02/2018  2:16 PM  Vitals shown include unvalidated device data.  Last Pain:  Vitals:   03/02/18 1109  TempSrc: Oral  PainSc: 3          Complications: No apparent anesthesia complications

## 2018-03-02 NOTE — Op Note (Addendum)
[facility] Patient Name: Chase [name] Procedure Date: 03/02/2018 12:09 PM MRN: [id number] Date of Birth: March 04, 1931 Attending MD: Hildred Laser , MD CSN: [id number] Age: 84 Admit Type: Inpatient Procedure:                ERCP Indications:              Evaluation and possible treatment of bile duct                            stone(s) Providers:                Hildred Laser, MD, Otis Peak B. Sharon Seller, RN, Nelma Rothman, Technician, Randa Spike, Technician Referring MD:             Kathie Dike, MD Medicines:                General Anesthesia Complications:            No immediate complications. Estimated Blood Loss:      Procedure:                Pre-Anesthesia Assessment:                           - Prior to the procedure, a History and Physical                            was performed, and patient medications and                            allergies were reviewed. The patient's tolerance of                            previous anesthesia was also reviewed. The risks                            and benefits of the procedure and the sedation                            options and risks were discussed with the patient.                            All questions were answered, and informed consent                            was obtained. Prior Anticoagulants: The patient                            last took aspirin 2 days and Plavix (clopidogrel) 5                            days prior to the procedure. ASA Grade Assessment:  III - A patient with severe systemic disease. After                            reviewing the risks and benefits, the patient was                            deemed in satisfactory condition to undergo the                            procedure.                           After obtaining informed consent, the scope was                            passed under direct vision. Throughout the   procedure, the patient's blood pressure, pulse, and                            oxygen saturations were monitored continuously. The                            [id number] ([id number]) scope was introduced through                            the mouth, and used to inject contrast into and                            used to inject contrast into the bile duct. The                            ERCP was accomplished without difficulty. The                            patient tolerated the procedure well. Scope In: 1:16:00 PM Scope Out: 1:53:02 PM Total Procedure Duration: 0 hours 37 minutes 2 seconds  Findings:      The scout film was normal. The esophagus was successfully intubated       under direct vision. The scope was advanced to a normal major papilla in       the descending duodenum without detailed examination of the pharynx,       larynx and associated structures, and upper GI tract. The upper GI tract       was grossly normal. The bile duct was deeply cannulated with the       Autotome sphincterotome. Contrast was injected. I personally interpreted       the bile duct images. Ductal flow of contrast was adequate. Image       quality was excellent. Contrast extended to the entire biliary tree. The       common bile duct was normal. The common hepatic duct was dilated. Distal       common hepatic duct contained filling defect(s) thought to be a stone. A       6 mm biliary sphincterotomy was made with a braided Autotome       sphincterotome using ERBE electrocautery. There was no  post-sphincterotomy bleeding. Sphincterotomy dilated with balloon to 8       mm. To discover objects, the biliary tree was swept with a basket       starting at the bifurcation. Clots were swept from the duct. To discover       objects, the biliary tree was swept with a 9 mm balloon, 12 mm balloon       and basket starting at the bifurcation. Debris was swept from the duct.       One 10 Fr by 7 cm plastic stent  with two external flaps was placed 6 cm       into the common bile duct. Bile flowed through the stent. The stent was       in good position. Impression:               - A filling defect consistent with a stone was seen                            on the cholangiogram at junction of CHD and CBD.                           - A biliary sphincterotomy was performed.                           - Sphincterotomy dilated to 8 mm.                           - The biliary tree was swept and clots were found.                            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                           - The biliary tree was swept and debris was found.                            It was sent for cytology.                           - One plastic stent was placed into the common bile                            duct. Moderate Sedation:      Per Anesthesia Care Recommendation:           - Clear liquid diet today.                           - Avoid aspirin and nonsteroidal anti-inflammatory                            medicines for 3 days.                           -  Repeat ERCP in 2 months with Spyglass examination.                           - Continue antibiotics for 2 more days. Procedure Code(s):        --- Professional ---                           [phone], Endoscopic retrograde                            cholangiopancreatography (ERCP); with placement of                            endoscopic stent into biliary or pancreatic duct,                            including pre- and post-dilation and guide wire                            passage, when performed, including sphincterotomy,                            when performed, each stent                           [id number], Endoscopic retrograde                            cholangiopancreatography (ERCP); with removal of                            calculi/debris from biliary/pancreatic duct(s) Diagnosis Code(s):        ---  Professional ---                           K80.50, Calculus of bile duct without cholangitis                            or cholecystitis without obstruction                           R93.2, Abnormal findings on diagnostic imaging of                            liver and biliary tract CPT copyright 2016 [employer] Association. All rights reserved. The codes documented in this report are preliminary and upon coder review may  be revised to meet current compliance requirements. Hildred Laser, MD Hildred Laser, MD 03/02/2018 2:42:15 PM This report has been signed electronically. Number of Addenda: 0

## 2018-03-02 NOTE — Anesthesia Preprocedure Evaluation (Signed)
Anesthesia Evaluation  Patient identified by MRN, date of birth, ID band Patient awake    Reviewed: Allergy & Precautions, NPO status , Patient's Chart, lab work & pertinent test results  Airway Mallampati: III  TM Distance: >3 FB Neck ROM: Limited   Comment: Limited side motion to right. Dental  (+) Poor Dentition, Chipped, Dental Advisory Given   Pulmonary neg pulmonary ROS,    breath sounds clear to auscultation       Cardiovascular hypertension, + Peripheral Vascular Disease   Rhythm:Regular Rate:Normal     Neuro/Psych Seizures -,  PSYCHIATRIC DISORDERS Depression CVA    GI/Hepatic   Endo/Other  diabetesHypothyroidism   Renal/GU Renal InsufficiencyRenal disease     Musculoskeletal  (+) Arthritis ,   Abdominal   Peds  Hematology  (+) anemia ,   Anesthesia Other Findings   Reproductive/Obstetrics                             Anesthesia Physical Anesthesia Plan  ASA: III  Anesthesia Plan: General   Post-op Pain Management:    Induction: Intravenous  PONV Risk Score and Plan:   Airway Management Planned: Oral ETT and Video Laryngoscope Planned  Additional Equipment:   Intra-op Plan:   Post-operative Plan: Extubation in OR  Informed Consent: I have reviewed the patients History and Physical, chart, labs and discussed the procedure including the risks, benefits and alternatives for the proposed anesthesia with the patient or authorized representative who has indicated his/her understanding and acceptance.     Plan Discussed with:   Anesthesia Plan Comments:         Anesthesia Quick Evaluation

## 2018-03-02 NOTE — Progress Notes (Signed)
PT Cancellation Note  Patient Details Name: Chase [name] MRN: [id number] DOB: 1931-10-07   Cancelled Treatment:    Reason Eval/Treat Not Completed: Patient at procedure or test/unavailable   Floria Raveling. Hartnett-Rands, MS, PT Per [place] [phone] 03/02/2018, 11:44 AM

## 2018-03-02 NOTE — Care Management Important Message (Signed)
Important Message  Patient Details  Name: ZACHERIE HONEYMAN MRN: [id number] Date of Birth: 1930/12/28   Medicare Important Message Given:  Yes    Sherald Barge, RN 03/02/2018, 11:16 AM

## 2018-03-02 NOTE — Progress Notes (Signed)
Patient has no complaints. He says pain around site of prior cholecystostomy tube has not changed.  He is still draining bilious fluid. He was served breakfast but he knew not to eat it. He is agreeable to proceed with ERCP with sphincterotomy and/or stenting. Patient's family is not at bedside.  For  ERCP with sphincterotomy and stone extraction plus minus stenting later today.

## 2018-03-02 NOTE — Anesthesia Procedure Notes (Signed)
Procedure Name: Intubation Date/Time: 03/02/2018 12:58 PM Performed by: Ollen Bowl, CRNA Pre-anesthesia Checklist: Patient identified, Patient being monitored, Timeout performed, Emergency Drugs available and Suction available Patient Re-evaluated:Patient Re-evaluated prior to induction Oxygen Delivery Method: Circle system utilized Preoxygenation: Pre-oxygenation with 100% oxygen Induction Type: IV induction Ventilation: Mask ventilation without difficulty Grade View: Grade I Tube type: Oral Tube size: 7.0 mm Number of attempts: 1 Airway Equipment and Method: Video-laryngoscopy and Rigid stylet Placement Confirmation: ETT inserted through vocal cords under direct vision,  positive ETCO2 and breath sounds checked- equal and bilateral Secured at: 22 cm Tube secured with: Tape Dental Injury: Teeth and Oropharynx as per pre-operative assessment

## 2018-03-02 NOTE — Clinical Social Work Note (Signed)
Following for dc planning. Pt having his ERCP today. Per MD, if pt stable, he will dc tomorrow. Will update Patricia at [place] and follow up in the AM.

## 2018-03-02 NOTE — Progress Notes (Signed)
Brief ERCP note:  Bulbar erosions. Normal ampulla of Vater. Normal size CBD.  Dilated CHD. Filling defect noted at transition. Stabilization of contrast into gallbladder and fistula. Biliary sphincterotomy performed. Basket and stone balloon extractor passed to the bile duct from the level of bifurcation.  Some debris and clot removed but no stone. Stone felt to be outside the bile duct and the cystic duct(Mirizzi syndrome) 10 French 7 cm long biliary stent placed for biliary decompression. Hepatic duct not filled with contrast.

## 2018-03-02 NOTE — Progress Notes (Signed)
[facility] PROGRESS NOTE  Chase [name] YIF:[id number] DOB: 1931-06-20 DOA: 02/25/2018 PCP: [name], MD  Brief summary   84 y.o. male with a PMH of CVA, stage IV CK D, hypothyroidism, seizure disorder, hypertension, insulin-dependent diabetes who was admitted 02/25/18 for evaluation of generalized weakness associated with abdominal pain. Found to have evidence of cholelithiasis and choledocholithiasis. gI following with possible plans for ERCP.   Assessment/Plan:  Cholelithiasis and choledocholithiasis/elevated LFTs. GI following with plans for ERCP with possible stent today. Continue Levaquin and metronidazole. Plavix on hold in anticipation of procedure. LFTs continue to trend down.  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 Draining a large amount of bilious material. Followed by Dr. Grandville Silos as an outpatient. Requires intermittent cauterization.  Hopefully will heal once bile duct cleared of stones and biliary obstruction resolved.  Hypothyroidism Continue Synthroid. TSH mildly low. Check free T4. May need adjustment of Synthroid dose if over replaced.  Uncontrolled type 2 diabetes mellitus with stage 4 chronic kidney disease, with long-term current use of insulin (HCC) Hemoglobin A1c 6.7%. Currently being managed with moderate scale SSI Q AC/HS.CBGs 87-162.  Seizure disorder, complex partial (Section) Continue Keppra. No seizures.  History of stroke Continue risk factor modification. Resume antiplatelet therapy post-procedure.  CKD (chronic kidney disease) stage 4, GFR 15-29 ml/min (HCC) with metabolic acidosis and anemia of CKD Creatinine 2.72 today, 3.5 on admission status post gentle hydration. Likely at baseline now.  Elevated troponin No reports of chest pain or shortness of breath. No acute changes on EKG. Trend flat. Likely reflective of decreased renal clearance.  Essential hypertension, benign Continue Norvasc. Chlorthalidone on  hold.  Generalized weakness/physical deconditioning Status post PT evaluation. SNF placement recommended.   Body mass index is 25.98 kg/m.    Code Status: full Family Communication: d/w patient, his family at the bedside (indicate person spoken with, relationship, and if by phone, the number) Disposition Plan: home pend clinical improvement, ercp    Consultants:  Gi   Procedures:  ERCP planned for today   Antibiotics: Anti-infectives (From admission, onward)   Start     Dose/Rate Route Frequency Ordered Stop   03/03/18 2200  levofloxacin (LEVAQUIN) IVPB 500 mg     500 mg 100 mL/hr over 60 Minutes Intravenous Every 48 hours 03/02/18 1022     02/27/18 2200  levofloxacin (LEVAQUIN) IVPB 750 mg  Status:  Discontinued     750 mg 100 mL/hr over 90 Minutes Intravenous Every 48 hours 02/26/18 0953 03/02/18 1022   02/25/18 2200  metroNIDAZOLE (FLAGYL) IVPB 500 mg     500 mg 100 mL/hr over 60 Minutes Intravenous Every 8 hours 02/25/18 2157     02/25/18 2130  levofloxacin (LEVAQUIN) IVPB 750 mg     750 mg 100 mL/hr over 90 Minutes Intravenous  Once 02/25/18 2118 02/25/18 2333        (indicate start date, and stop date if known)  HPI/Subjective: He reports feeling well. No acute pains. Awaiting ERCP. No vomiting   Objective: Vitals:   03/01/18 2203 03/02/18 0631  BP: (!) 145/51 (!) 143/61  Pulse: 62 60  Resp:    Temp: 98.4 F (36.9 C) 98.4 F (36.9 C)  SpO2: 100% 100%    Intake/Output Summary (Last 24 hours) at 03/02/2018 1036 Last data filed at 03/02/2018 8676 Gross per 24 hour  Intake 240 ml  Output 1800 ml  Net -1560 ml   Filed Weights   02/28/18 0450 03/01/18 0524 03/02/18 0631  Weight: 77.3 kg (  170 lb 6.7 oz) 77.5 kg (170 lb 13.7 oz) 76 kg (167 lb 8.8 oz)    Exam:   General:  No distress   Cardiovascular: s1,s2 rrr  Respiratory: CTA BL  Abdomen: soft, mild tender  Musculoskeletal: no leg edema    Data Reviewed: Basic Metabolic  Panel: Recent Labs  Lab 02/25/18 1735 02/26/18 0614 02/27/18 0710 02/28/18 0559 03/01/18 0522  NA 138 139 137 134* 136  K 3.6 3.9 3.7 3.8 3.8  CL 107 110 109 107 107  CO2 20* 18* 18* 18* 18*  GLUCOSE 152* 97 121* 109* 101*  BUN 52* 48* 44* 41* 37*  CREATININE 3.54* 3.08* 2.79* 2.67* 2.72*  CALCIUM 9.3 8.9 8.8* 8.4* 8.6*   Liver Function Tests: Recent Labs  Lab 02/25/18 1743 02/26/18 0614 02/27/18 0710 02/28/18 0559 03/01/18 0522  AST 382* 202* 134* 171* 94*  ALT 492* 380* 278* 239* 181*  ALKPHOS 341* 311* 280* 274* 251*  BILITOT 1.8* 1.1 0.9 1.0 1.1  PROT 6.9 6.5 6.5 6.0* 6.4*  ALBUMIN 3.3* 3.0* 2.9* 2.8* 3.0*   Recent Labs  Lab 02/25/18 1743  LIPASE 25   No results for input(s): AMMONIA in the last 168 hours. CBC: Recent Labs  Lab 02/25/18 1636 02/26/18 0614 02/27/18 0710  WBC 8.0 6.0 6.3  NEUTROABS  --  4.1  --   HGB 10.8* 9.4* 9.9*  HCT 33.9* 29.5* 31.1*  MCV 96.3 96.7 96.0  PLT 218 210 215   Cardiac Enzymes: Recent Labs  Lab 02/25/18 1743 02/25/18 1934 02/25/18 2349 02/26/18 0614 02/26/18 1227  CKTOTAL  --  60  --   --   --   TROPONINI 0.06*  --  0.06* 0.06* 0.05*   BNP (last 3 results) Recent Labs    02/25/18 1743  BNP 271.0*    ProBNP (last 3 results) No results for input(s): PROBNP in the last 8760 hours.  CBG: Recent Labs  Lab 03/01/18 0737 03/01/18 1215 03/01/18 1810 03/01/18 2144 03/02/18 0805  GLUCAP 110* 175* 94 106* 97    No results found for this or any previous visit (from the past 240 hour(s)).   Studies: No results found.  Scheduled Meds: . amLODipine  10 mg Oral Daily  . insulin aspart  0-15 Units Subcutaneous TID WC  . insulin aspart  0-5 Units Subcutaneous QHS  . levETIRAcetam  250 mg Oral QHS  . levothyroxine  137 mcg Oral QAC breakfast  . olopatadine  1 drop Both Eyes BID  . ondansetron  4 mg Intravenous Once  . sodium chloride flush  3 mL Intravenous Q12H  . tamsulosin  0.4 mg Oral QPC supper    Continuous Infusions: . sodium chloride 75 mL/hr at 03/01/18 2140  . [START ON 03/03/2018] levofloxacin (LEVAQUIN) IV    . metronidazole Stopped (03/02/18 0932)    Principal Problem:   Choledocholithiasis Active Problems:   Hypothyroidism   Uncontrolled type 2 diabetes mellitus with stage 4 chronic kidney disease, with long-term current use of insulin (HCC)   Seizure disorder, complex partial (HCC)   History of stroke   CKD (chronic kidney disease) stage 4, GFR 15-29 ml/min (HCC)   Anemia of chronic disease   Elevated troponin   Essential hypertension, benign   Physical deconditioning   Metabolic acidosis   Cholecystocutaneous fistula    Time spent: >35 minutes     Kinnie Feil  [facility] Pager [phone]. If 7PM-7AM, please contact night-coverage at [email or web address], password [facility] 03/02/2018, 10:36 AM  LOS: 5 days

## 2018-03-02 NOTE — Anesthesia Postprocedure Evaluation (Signed)
Anesthesia Post Note  Patient: Chase [name]  Procedure(s) Performed: ENDOSCOPIC RETROGRADE CHOLANGIOPANCREATOGRAPHY (ERCP) With sphincterotomy and stent placement (N/A )  Patient location during evaluation: PACU Anesthesia Type: General Level of consciousness: awake and alert Pain management: pain level controlled Vital Signs Assessment: post-procedure vital signs reviewed and stable Respiratory status: spontaneous breathing Cardiovascular status: blood pressure returned to baseline and stable Postop Assessment: no apparent nausea or vomiting Anesthetic complications: no     Last Vitals:  Vitals:   03/02/18 1414 03/02/18 1415  BP:  (!) 149/64  Pulse:  66  Resp:  20  Temp: 36.4 C   SpO2:  100%    Last Pain:  Vitals:   03/02/18 1415  TempSrc:   PainSc: 0-No pain                 Federick Levene

## 2018-03-03 DIAGNOSIS — E1165 Type 2 diabetes mellitus with hyperglycemia: Secondary | ICD-10-CM

## 2018-03-03 DIAGNOSIS — E039 Hypothyroidism, unspecified: Secondary | ICD-10-CM | POA: Diagnosis not present

## 2018-03-03 DIAGNOSIS — K8050 Calculus of bile duct without cholangitis or cholecystitis without obstruction: Secondary | ICD-10-CM

## 2018-03-03 DIAGNOSIS — N170 Acute kidney failure with tubular necrosis: Secondary | ICD-10-CM | POA: Diagnosis not present

## 2018-03-03 DIAGNOSIS — M6281 Muscle weakness (generalized): Secondary | ICD-10-CM | POA: Diagnosis not present

## 2018-03-03 DIAGNOSIS — Z794 Long term (current) use of insulin: Secondary | ICD-10-CM

## 2018-03-03 DIAGNOSIS — E1151 Type 2 diabetes mellitus with diabetic peripheral angiopathy without gangrene: Secondary | ICD-10-CM | POA: Diagnosis not present

## 2018-03-03 DIAGNOSIS — R2689 Other abnormalities of gait and mobility: Secondary | ICD-10-CM | POA: Diagnosis not present

## 2018-03-03 DIAGNOSIS — M79671 Pain in right foot: Secondary | ICD-10-CM | POA: Diagnosis not present

## 2018-03-03 DIAGNOSIS — D638 Anemia in other chronic diseases classified elsewhere: Secondary | ICD-10-CM

## 2018-03-03 DIAGNOSIS — E1122 Type 2 diabetes mellitus with diabetic chronic kidney disease: Secondary | ICD-10-CM

## 2018-03-03 DIAGNOSIS — N400 Enlarged prostate without lower urinary tract symptoms: Secondary | ICD-10-CM | POA: Diagnosis not present

## 2018-03-03 DIAGNOSIS — E1140 Type 2 diabetes mellitus with diabetic neuropathy, unspecified: Secondary | ICD-10-CM | POA: Diagnosis not present

## 2018-03-03 DIAGNOSIS — N184 Chronic kidney disease, stage 4 (severe): Secondary | ICD-10-CM | POA: Diagnosis not present

## 2018-03-03 DIAGNOSIS — Z7401 Bed confinement status: Secondary | ICD-10-CM | POA: Diagnosis not present

## 2018-03-03 DIAGNOSIS — I10 Essential (primary) hypertension: Secondary | ICD-10-CM | POA: Diagnosis not present

## 2018-03-03 DIAGNOSIS — L110 Acquired keratosis follicularis: Secondary | ICD-10-CM | POA: Diagnosis not present

## 2018-03-03 DIAGNOSIS — D649 Anemia, unspecified: Secondary | ICD-10-CM | POA: Diagnosis not present

## 2018-03-03 DIAGNOSIS — K831 Obstruction of bile duct: Secondary | ICD-10-CM | POA: Diagnosis not present

## 2018-03-03 DIAGNOSIS — N200 Calculus of kidney: Secondary | ICD-10-CM | POA: Diagnosis not present

## 2018-03-03 DIAGNOSIS — R6889 Other general symptoms and signs: Secondary | ICD-10-CM | POA: Diagnosis not present

## 2018-03-03 DIAGNOSIS — R5381 Other malaise: Secondary | ICD-10-CM

## 2018-03-03 DIAGNOSIS — E119 Type 2 diabetes mellitus without complications: Secondary | ICD-10-CM | POA: Diagnosis not present

## 2018-03-03 DIAGNOSIS — Z01812 Encounter for preprocedural laboratory examination: Secondary | ICD-10-CM | POA: Diagnosis not present

## 2018-03-03 DIAGNOSIS — I129 Hypertensive chronic kidney disease with stage 1 through stage 4 chronic kidney disease, or unspecified chronic kidney disease: Secondary | ICD-10-CM | POA: Diagnosis not present

## 2018-03-03 DIAGNOSIS — E038 Other specified hypothyroidism: Secondary | ICD-10-CM | POA: Diagnosis not present

## 2018-03-03 DIAGNOSIS — K823 Fistula of gallbladder: Secondary | ICD-10-CM | POA: Diagnosis not present

## 2018-03-03 DIAGNOSIS — G40909 Epilepsy, unspecified, not intractable, without status epilepticus: Secondary | ICD-10-CM | POA: Diagnosis not present

## 2018-03-03 DIAGNOSIS — R279 Unspecified lack of coordination: Secondary | ICD-10-CM | POA: Diagnosis not present

## 2018-03-03 DIAGNOSIS — E872 Acidosis: Secondary | ICD-10-CM | POA: Diagnosis not present

## 2018-03-03 LAB — GLUCOSE, CAPILLARY
Glucose-Capillary: 108 mg/dL — ABNORMAL HIGH (ref 65–99)
Glucose-Capillary: 177 mg/dL — ABNORMAL HIGH (ref 65–99)
Glucose-Capillary: 159 mg/dL — ABNORMAL HIGH (ref 65–99)

## 2018-03-03 LAB — COMPREHENSIVE METABOLIC PANEL WITH GFR
ALT: 101 U/L — ABNORMAL HIGH (ref 17–63)
AST: 47 U/L — ABNORMAL HIGH (ref 15–41)
Albumin: 2.9 g/dL — ABNORMAL LOW (ref 3.5–5.0)
Alkaline Phosphatase: 199 U/L — ABNORMAL HIGH (ref 38–126)
Anion gap: 10 (ref 5–15)
BUN: 34 mg/dL — ABNORMAL HIGH (ref 6–20)
CO2: 19 mmol/L — ABNORMAL LOW (ref 22–32)
Calcium: 8.5 mg/dL — ABNORMAL LOW (ref 8.9–10.3)
Chloride: 106 mmol/L (ref 101–111)
Creatinine, Ser: 2.74 mg/dL — ABNORMAL HIGH (ref 0.61–1.24)
GFR calc Af Amer: 23 mL/min — ABNORMAL LOW
GFR calc non Af Amer: 20 mL/min — ABNORMAL LOW
Glucose, Bld: 104 mg/dL — ABNORMAL HIGH (ref 65–99)
Potassium: 4.1 mmol/L (ref 3.5–5.1)
Sodium: 135 mmol/L (ref 135–145)
Total Bilirubin: 0.9 mg/dL (ref 0.3–1.2)
Total Protein: 6 g/dL — ABNORMAL LOW (ref 6.5–8.1)

## 2018-03-03 LAB — CBC
HCT: 30.8 % — ABNORMAL LOW (ref 39.0–52.0)
Hemoglobin: 10.1 g/dL — ABNORMAL LOW (ref 13.0–17.0)
MCH: 31.2 pg (ref 26.0–34.0)
MCHC: 32.8 g/dL (ref 30.0–36.0)
MCV: 95.1 fL (ref 78.0–100.0)
Platelets: 250 K/uL (ref 150–400)
RBC: 3.24 MIL/uL — ABNORMAL LOW (ref 4.22–5.81)
RDW: 12.9 % (ref 11.5–15.5)
WBC: 7.4 K/uL (ref 4.0–10.5)

## 2018-03-03 LAB — AMYLASE: Amylase: 145 U/L — ABNORMAL HIGH (ref 28–100)

## 2018-03-03 MED ORDER — METRONIDAZOLE 500 MG PO TABS: 500.0000 mg | ORAL_TABLET | Freq: Three times a day (TID) | ORAL | Status: DC

## 2018-03-03 MED ORDER — PHENOL 1.4 % MT LIQD
1.0000 | OROMUCOSAL | Status: DC | PRN
  Administered 2018-03-03: 1 via OROMUCOSAL
  Filled 2018-03-03: qty 177

## 2018-03-03 MED ORDER — METRONIDAZOLE 500 MG PO TABS: 500.0000 mg | ORAL_TABLET | Freq: Three times a day (TID) | ORAL | 0 refills | Status: AC

## 2018-03-03 MED ORDER — LEVOFLOXACIN 500 MG PO TABS: 500.0000 mg | ORAL_TABLET | ORAL | Status: DC

## 2018-03-03 MED ORDER — CLOPIDOGREL BISULFATE 75 MG PO TABS
75.0000 mg | ORAL_TABLET | Freq: Every day | ORAL | Status: DC
  Administered 2018-03-03: 75 mg via ORAL
  Filled 2018-03-03: qty 1

## 2018-03-03 MED ORDER — ASPIRIN EC 81 MG PO TBEC: 81.0000 mg | DELAYED_RELEASE_TABLET | Freq: Every day | ORAL | 2 refills | Status: DC

## 2018-03-03 MED ORDER — LEVOFLOXACIN 500 MG PO TABS: 500.0000 mg | ORAL_TABLET | ORAL | 0 refills | Status: AC

## 2018-03-03 MED ORDER — METRONIDAZOLE 500 MG PO TABS
500.0000 mg | ORAL_TABLET | Freq: Three times a day (TID) | ORAL | Status: DC
  Administered 2018-03-03: 500 mg via ORAL
  Filled 2018-03-03: qty 1

## 2018-03-03 MED ORDER — LEVOTHYROXINE SODIUM 112 MCG PO TABS: 112.0000 ug | ORAL_TABLET | Freq: Every morning | ORAL | 0 refills | Status: DC

## 2018-03-03 NOTE — Care Management Note (Signed)
Case Management Note  Patient Details  Name: Chase [name] MRN: [id number] Date of Birth: 06-02-1931  If discussed at Long Length of Stay Meetings, dates discussed:  03/03/18  Additional Comments:  Sherald Barge, RN 03/03/2018, 1:21 PM

## 2018-03-03 NOTE — Anesthesia Postprocedure Evaluation (Signed)
Anesthesia Post Note  Patient: Chase [name]  Procedure(s) Performed: ENDOSCOPIC RETROGRADE CHOLANGIOPANCREATOGRAPHY (ERCP) With sphincterotomy and stent placement (N/A )  Patient location during evaluation: Nursing Unit Anesthesia Type: General Level of consciousness: awake and alert and patient cooperative Pain management: satisfactory to patient Vital Signs Assessment: post-procedure vital signs reviewed and stable Respiratory status: spontaneous breathing Cardiovascular status: stable Postop Assessment: no apparent nausea or vomiting and adequate PO intake Anesthetic complications: no     Last Vitals:  Vitals:   03/03/18 0644 03/03/18 0834  BP: 127/85 (!) 127/55  Pulse: 63   Resp:    Temp: 36.8 C   SpO2: 100%     Last Pain:  Vitals:   03/03/18 1000  TempSrc:   PainSc: 0-No pain                 Dannon Nguyenthi

## 2018-03-03 NOTE — Addendum Note (Signed)
Addendum  created 03/03/18 1314 by Vista Deck, CRNA   Sign clinical note

## 2018-03-03 NOTE — Progress Notes (Signed)
Report called to [facility]

## 2018-03-03 NOTE — Clinical Social Work Note (Signed)
Facility notified and discharge clinicals sent.  Family aware.  LCSW signing off.    Levorn Oleski, Clydene Pugh, LCSW

## 2018-03-03 NOTE — Progress Notes (Addendum)
Subjective:  No complaints. Wants solid food.   Objective: Vital signs in last 24 hours: Temp:  [97.6 F (36.4 C)-98.5 F (36.9 C)] 98.3 F (36.8 C) (03/28 0644) Pulse Rate:  [51-66] 63 (03/28 0644) Resp:  [15-28] 20 (03/27 1540) BP: (120-159)/(50-94) 127/55 (03/28 0834) SpO2:  [10 %-100 %] 100 % (03/28 0644) Weight:  [167 lb 8.8 oz (76 kg)] 167 lb 8.8 oz (76 kg) (03/28 0644) Last BM Date: 03/01/18 General:   Alert,  Well-developed, well-nourished, pleasant and cooperative in NAD Head:  Normocephalic and atraumatic. Eyes:  Sclera clear, no icterus.  Abdomen:  Soft, nontender and nondistended. Dressing, in ruq is dry. Normal bowel sounds, without guarding, and without rebound.   Extremities:  Without clubbing, deformity or edema. Neurologic:  Alert and  oriented x4;  grossly normal neurologically. Skin:  Intact without significant lesions or rashes. Psych:  Alert and cooperative. Normal mood and affect.  Intake/Output from previous day: 03/27 0701 - 03/28 0700 In: 1238.8 [P.O.:120; I.V.:1118.8] Out: 900 [Urine:900] Intake/Output this shift: No intake/output data recorded.  Lab Results: CBC No results for input(s): WBC, HGB, HCT, MCV, PLT in the last 72 hours. BMET Recent Labs    03/01/18 0522  NA 136  K 3.8  CL 107  CO2 18*  GLUCOSE 101*  BUN 37*  CREATININE 2.72*  CALCIUM 8.6*   LFTs Recent Labs    03/01/18 0522  BILITOT 1.1  ALKPHOS 251*  AST 94*  ALT 181*  PROT 6.4*  ALBUMIN 3.0*   No results for input(s): LIPASE in the last 72 hours. PT/INR No results for input(s): LABPROT, INR in the last 72 hours.    Imaging Studies: Ct Abdomen Pelvis Wo Contrast  Result Date: 02/25/2018 CLINICAL DATA:  Fatigue with weakness and loss of appetite EXAM: CT ABDOMEN AND PELVIS WITHOUT CONTRAST TECHNIQUE: Multidetector CT imaging of the abdomen and pelvis was performed following the standard protocol without IV contrast. COMPARISON:  CT 11/24/2017, 12/01/2014  FINDINGS: Lower chest: Lung bases demonstrate no acute consolidation or pleural effusion. Trace pericardial effusion. Borderline heart size. Hepatobiliary: Calcified granuloma in the liver. Multiple calcified stones in the gallbladder. Slightly enlarged extrahepatic common bowel duct measuring up to 9 mm. 9 mm stone in the distal common duct as before. Pancreas: Unremarkable. No pancreatic ductal dilatation or surrounding inflammatory changes. Spleen: Normal in size without focal abnormality. Adrenals/Urinary Tract: Adrenal glands are within normal limits. Hypodense lesions upper pole left kidney. Dilated extrarenal pelvis bilaterally. No ureteral stones. Slightly thick-walled bladder Stomach/Bowel: Stomach is within normal limits. Appendix appears normal. No evidence of bowel wall thickening, distention, or inflammatory changes. Sigmoid colon diverticular disease without acute inflammation Vascular/Lymphatic: Moderate aortic atherosclerosis. No aneurysmal dilatation. No significantly enlarged lymph nodes. Reproductive: Prostate calcification Other: Negative for free air or free fluid. Musculoskeletal: Degenerative changes with ankylosis at L4 and L5. IMPRESSION: 1. Multiple calcified gallstones. Linear soft tissue density at the right abdominal skin surface coursing toward the fundus of the gallbladder, felt related to prior cholecystostomy tube placement. 9 mm stone in the distal common bile duct with mild extrahepatic biliary dilatation and slight central intra hepatic biliary dilatation. 2. Similar appearance of dilated extrarenal pelvises but without ureteral stone. Slightly thick-walled appearance of the bladder 3. Sigmoid colon diverticular disease without acute inflammation. Electronically Signed   By: Donavan Foil M.D.   On: 02/25/2018 19:31   Dg Chest 2 View  Result Date: 02/25/2018 CLINICAL DATA:  Weakness. EXAM: CHEST - 2 VIEW COMPARISON:  11/24/2017 FINDINGS: Lungs are adequately inflated without  focal consolidation or effusion. Borderline stable cardiomegaly. Mild calcified plaque over the aortic arch. Minimal degenerate change of the spine. IMPRESSION: No active cardiopulmonary disease. Electronically Signed   By: Marin Olp M.D.   On: 02/25/2018 19:34   Ct Head Wo Contrast  Result Date: 02/25/2018 CLINICAL DATA:  Fatigue and generalized weakness as well as decreased appetite 2 days. Recent fall. EXAM: CT HEAD WITHOUT CONTRAST TECHNIQUE: Contiguous axial images were obtained from the base of the skull through the vertex without intravenous contrast. COMPARISON:  07/28/2016 FINDINGS: Brain: Ventricles, cisterns and other CSF spaces are within normal as there is mild age related atrophic change. There is moderate chronic ischemic microvascular disease. Small old left frontal infarct. No mass, mass effect, shift of midline structures or acute hemorrhage. No evidence of acute infarction. Basal ganglia calcifications are present. Vascular: No hyperdense vessel or unexpected calcification. Skull: Normal. Negative for fracture or focal lesion. Sinuses/Orbits: Orbits are within normal. Paranasal sinuses are clear. Mastoid air cells are clear. Other: None. IMPRESSION: No acute findings. Moderate chronic ischemic microvascular disease. Mild age related atrophic change. Small old left frontal infarct. Electronically Signed   By: Marin Olp M.D.   On: 02/25/2018 19:25   Dg Ercp With Sphincterotomy  Result Date: 03/02/2018 CLINICAL DATA:  Choledocholithiasis EXAM: ERCP TECHNIQUE: Multiple spot images obtained with the fluoroscopic device and submitted for interpretation post-procedure. FLUOROSCOPY TIME:  Fluoroscopy Time:  3 minutes and 41 seconds Radiation Exposure Index (if provided by the fluoroscopic device): Number of Acquired Spot Images: 0 COMPARISON:  None. FINDINGS: Images demonstrate contrast filling the common bile duct. A biliary stent is in place on the final image. IMPRESSION: See above.  These images were submitted for radiologic interpretation only. Please see the procedural report for the amount of contrast and the fluoroscopy time utilized. Electronically Signed   By: Marybelle Killings M.D.   On: 03/02/2018 15:24  [2 weeks]   Assessment: 84 year old gentleman with multiple medical problems including chronic kidney disease, history of stroke who was brought to the emergency department for weakness and failure to thrive.  He also has a history of cholecystostomy in December 2015 for acute cholecystitis as it was felt he was too high risk for cholecystectomy.  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  On evaluation this admission he has had elevated transaminases, CT revealed mildly dilated biliary system with 9 mm stone as well as contracted gallbladder with calcified stone.  ERCP delayed due to Plavix.  ERCP performed yesterday showing bulbar erosions, filling defect consistent with stone seen on cholangiogram at junction of common hepatic duct and common bile duct.  Biliary sphincterotomy performed and dilated to 8 mm.  Biliary tree was swept and clots were found.  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 Biliary tree was swept and debris was found.  Sent for cytology.  Plastic stent placed in the common bile duct.   Plan: 1. Plans for repeat ERCP in 2 months with spyglass examination per Dr. Laural Golden 2. Recommend continuing antibiotics for additional 24 hours. 3. Avoid aspirin and NSAIDs for 3 days post ERCP. 4. Consider advancing diet. 5. Per Dr. Laural Golden, ok to resume Plavix.  6. F/u pending path and labs.   Laureen Ochs. Bernarda Caffey [facility] [phone] 3/28/20199:34 AM     LOS: 6 days    Addendum:  Improvement in LFTs s/p ERCP. Patient to follow  up with Dr. Laural Golden for ERCP in 2 months.   Laureen Ochs. Bernarda Caffey [facility] [phone] 3/28/201912:52 PM

## 2018-03-03 NOTE — Clinical Social Work Placement (Signed)
   CLINICAL SOCIAL WORK PLACEMENT  NOTE  Date:  03/03/2018  Patient Details  Name: Chase [name] MRN: [id number] Date of Birth: 07-19-1931  Clinical Social Work is seeking post-discharge placement for this patient at the [facility] level of care (*CSW will initial, date and re-position this form in  chart as items are completed):  Yes   Patient/family provided with [facility] Work Department's list of facilities offering this level of care within the geographic area requested by the patient (or if unable, by the patient's family).  Yes   Patient/family informed of their freedom to choose among providers that offer the needed level of care, that participate in Medicare, Medicaid or managed care program needed by the patient, have an available bed and are willing to accept the patient.  Yes   Patient/family informed of [facility]'s ownership interest in [facility] and [facility], as well as of the fact that they are under no obligation to receive care at these facilities.  PASRR submitted to EDS on 02/28/18     PASRR number received on 02/28/18     Existing PASRR number confirmed on       FL2 transmitted to all facilities in geographic area requested by pt/family on 02/28/18     FL2 transmitted to all facilities within larger geographic area on       Patient informed that his/her managed care company has contracts with or will negotiate with certain facilities, including the following:        Yes   Patient/family informed of bed offers received.  Patient chooses bed at [facility]     Physician recommends and patient chooses bed at      [facility].  Patient to be transferred to facility by [facility]     Patient family notified on 03/03/18 of transfer.  Name of family member notified:  family in room and aware     PHYSICIAN       Additional Comment:  LCSW signing off.     _______________________________________________ [name], LCSW 03/03/2018, 4:03 PM

## 2018-03-03 NOTE — Progress Notes (Signed)
Physical Therapy Treatment Patient Details Name: Chase [name] MRN: [id number] DOB: August 14, 1931 Today's Date: 03/03/2018    History of Present Illness Chase [name] is a 84 y.o. male with medical history significant for history of CVA, chronic kidney disease stage IV, hypothyroidism, seizure disorder, hypertension, and insulin-dependent diabetes mellitus, now presenting to the emergency department for evaluation of generalized weakness and abdominal pain.  Patient is accompanied by family who provide most of the history.  He has reportedly been developing progressive generalized weakness and fatigue over the past several days, manifest with difficulty using his walker unassisted.  Patient had been complaining of pain in the abdomen and vomited once yesterday.      PT Comments    Patient demonstrates increased tolerance for standing, taking steps and sitting up in chair, occasionally loses sitting balance due to leaning backwards while completing BLE exercises, able to grip RW with right hand, limited to steps at bedside due to extremely kyphotic trunk, poor standing balance and BLE weakness.  Patient able to complete standing partial squats x 10 and  tolerated sitting up in chair with spouse present after therapy - nursing staff aware.  Patient will benefit from continued physical therapy in hospital and recommended venue below to increase strength, balance, endurance for safe ADLs and gait.   Follow Up Recommendations  SNF     Equipment Recommendations       Recommendations for Other Services       Precautions / Restrictions Precautions Precautions: Fall Restrictions Weight Bearing Restrictions: No    Mobility  Bed Mobility Overal bed mobility: Needs Assistance Bed Mobility: Supine to Sit   Sidelying to sit: Mod assist       General bed mobility comments: with head of bed flat, limited use of RUE due to old CVA  Transfers Overall transfer level: Needs assistance Equipment  used: Rolling walker (2 wheeled) Transfers: Sit to/from [facility] Sit to Stand: Mod assist Stand pivot transfers: Mod assist          Ambulation/Gait Ambulation/Gait assistance: Max assist Ambulation Distance (Feet): 5 Feet Assistive device: Rolling walker (2 wheeled) Gait Pattern/deviations: Decreased step length - right;Decreased step length - left;Decreased stride length   Gait velocity interpretation: Below normal speed for age/gender General Gait Details: demonstrates slow labored movement, able to grip walker with right hand, very kyphotic with leaning over RW, difficulty looking up, requires Max assist to keep from falling backwards, limited 7-8 short labored side steps   Stairs            Wheelchair Mobility    Modified Rankin (Stroke Patients Only)       Balance Overall balance assessment: Needs assistance Sitting-balance support: Feet supported;No upper extremity supported Sitting balance-Leahy Scale: Good     Standing balance support: Bilateral upper extremity supported;During functional activity Standing balance-Leahy Scale: Poor Standing balance comment: fair/poor with RW                            Cognition Arousal/Alertness: Awake/alert Behavior During Therapy: WFL for tasks assessed/performed Overall Cognitive Status: Within Functional Limits for tasks assessed                                        Exercises General Exercises - Lower Extremity Long Arc Quad: Seated;AROM;Strengthening;Both;10 reps Hip Flexion/Marching: Seated;AROM;Strengthening;Both;10 reps Toe Raises: Seated;AROM;Strengthening;Both;10 reps Heel  Raises: Seated;AROM;Strengthening;Both;10 reps Other Exercises Other Exercises: active assisted ROM to right elbow, shoulder x 10 reps     General Comments        Pertinent Vitals/Pain Pain Assessment: No/denies pain    Home Living                      Prior Function             PT Goals (current goals can now be found in the care plan section) Acute Rehab PT Goals Patient Stated Goal: To go somewhere patient can get more therapy before going home PT Goal Formulation: With patient/family Time For Goal Achievement: 03/10/18 Potential to Achieve Goals: Good Progress towards PT goals: Progressing toward goals    Frequency    Min 3X/week      PT Plan Current plan remains appropriate    Co-evaluation              AM-PAC PT "6 Clicks" Daily Activity  Outcome Measure  Difficulty turning over in bed (including adjusting bedclothes, sheets and blankets)?: A Little Difficulty moving from lying on back to sitting on the side of the bed? : A Lot Difficulty sitting down on and standing up from a chair with arms (e.g., wheelchair, bedside commode, etc,.)?: A Lot Help needed moving to and from a bed to chair (including a wheelchair)?: A Lot Help needed walking in hospital room?: A Lot Help needed climbing 3-5 steps with a railing? : Total 6 Click Score: 12    End of Session Equipment Utilized During Treatment: Gait belt Activity Tolerance: Patient tolerated treatment well;Patient limited by fatigue Patient left: in chair;with call bell/phone within reach;with family/visitor present Nurse Communication: Mobility status;Other (comment)(nursing staff aware patient left up in chair) PT Visit Diagnosis: Unsteadiness on feet (R26.81);Other abnormalities of gait and mobility (R26.89);Muscle weakness (generalized) (M62.81)     Time: 4580-9983 PT Time Calculation (min) (ACUTE ONLY): 31 min  Charges:  $Therapeutic Exercise: 8-22 mins $Therapeutic Activity: 8-22 mins                    G Codes:       12:29 PM, 03/19/2018 Lonell Grandchild, MPT Physical Therapist with [facility] 336 [phone] office [phone] mobile phone

## 2018-03-03 NOTE — Discharge Summary (Addendum)
Physician Discharge Summary  Chase [name] WNI:[id number] DOB: July 27, 1931 DOA: 02/25/2018  PCP: [name], MD  Admit date: 02/25/2018 Discharge date: 03/03/2018  Admitted From: Home Disposition: Skilled nursing facility  Recommendations for Outpatient Follow-up:  1. Follow-up with MD at SNF within 1 week.  Please check LFTs in the next 2-3 days. 2. Follow-up with GI Dr Laural Golden in 2 weeks.  Home Health: None Equipment/Devices: As per therapy at the facility  Discharge Condition: Fair CODE STATUS: Full code Diet recommendation: Heart healthy/carb modified    Discharge Diagnoses:  Principal Problem:   Choledocholithiasis   Active Problems:   Hypothyroidism   Uncontrolled type 2 diabetes mellitus with stage 4 chronic kidney disease, with long-term current use of insulin (HCC)   Seizure disorder, complex partial (HCC)   History of stroke   CKD (chronic kidney disease) stage 4, GFR 15-29 ml/min (HCC)   Anemia of chronic disease   Elevated troponin   Essential hypertension, benign   Physical deconditioning   Metabolic acidosis   Cholecystocutaneous fistula  Brief narrative/history of presenting illness Please refer to admission H&P for details, in brief, 84 year old male with history of CVA,  chronic kidney disease stage IV, prior  Cholecystostomy with bilious drainage,  seizure disorder, hypertension, hypothyroidism and insulin-dependent diabetes admitted with generalized weakness and abdominal pain.  He was found to have choledocholithiasis with transaminitis and underwent ERCP.   Hospital course Choledocholithiasis with transaminitis ERCP done showing filling defect consistent with stone at the junction of common hepatic duct and common biliary duct.  A biliary sphincterotomy was performed and dilated to 8 mm.  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  The biliary tree was swept and debris was removed and sent for cytology.  One plastic stent  was then placed into the CBD. Diet advanced and patient tolerating well.  LFTs trending down.  No further abdominal pain. GI recommends holding aspirin and avoiding NSAIDs for 3 days after procedure. Follow cytology results and outpatient follow-up with Dr. Laural Golden in 2 weeks. Patient will complete 7-day course of antibiotics on 3/30.  GI consult appreciated.   Sinus tract fistula from prior cholecystostomy tube. Still draining large amount of bilious material.  Follows with [place] [facility] (Dr. Grandville Silos).  Continue clean dressing.  Hypothyroidism TSH is suppressed with mildly elevated free T4.  Will reduce Synthroid dose.  Uncontrolled type 2 diabetes mellitus with hyperglycemia and  chronic kidney disease stage IV with long-term current use of insulin (HCC) A1c of 6.7.  To new home dose insulin.  Complex partial seizures.   Continue Keppra.  Acute on chronic kidney disease stage IV with metabolic acidosis and anemia of chronic kidney disease. Likely prerenal with mildly worsened renal function on admission which improved with IV fluids.  (Baseline creatinine of 2.7).  Elevated troponin Likely associated with chronic kidney disease.  Asymptomatic.  Essential hypertension Continue Norvasc.  Resume chlorthalidone.  Generalized weakness Seen by PT and recommends SNF.     Procedure: ERCP with stent placement  Consults: GI ((Dr Laural Golden)  Disposition: Skilled nursing facility  Family communication: Wife and daughter at bedside.    Discharge Instructions   Allergies as of 03/03/2018      Reactions   Bayer Advanced Aspirin [aspirin] Nausea And Vomiting   Penicillins Nausea And Vomiting   Has patient had a PCN reaction causing immediate rash, facial/tongue/throat swelling, SOB or lightheadedness with hypotension: unknown Has patient had a PCN reaction causing severe rash involving mucus  membranes or skin necrosis: unknown Has patient had a PCN reaction that required  hospitalization: unknown Has patient had a PCN reaction occurring within the last 10 years: no If all of the above answers are "NO", then may proceed with Cephalosporin use.      Medication List    TAKE these medications   amLODipine 10 MG tablet Commonly known as:  NORVASC TAKE 1 TABLET BY MOUTH ONCE DAILY   aspirin EC 81 MG tablet Take 1 tablet (81 mg total) by mouth daily. Start taking on:  03/05/2018 What changed:  These instructions start on 03/05/2018. If you are unsure what to do until then, ask your doctor or other care provider.   chlorthalidone 50 MG tablet Commonly known as:  HYGROTON TAKE 1 TABLET BY MOUTH ONCE DAILY   clobetasol cream 0.05 % Commonly known as:  TEMOVATE   clopidogrel 75 MG tablet Commonly known as:  PLAVIX TAKE 1 TABLET BY MOUTH ONCE DAILY   desonide 0.05 % cream Commonly known as:  DESOWEN   EYE VITAMINS PO Take 1 tablet by mouth daily.   glucose blood test strip Commonly known as:  ONE TOUCH ULTRA TEST USE 1 STRIP TO CHECK GLUCOSE UP TO TWICE DAILY E11.65   ONE TOUCH ULTRA TEST test strip Generic drug:  glucose blood USE 1 STRIP TO CHECK GLUCOSE TWICE DAILY   Insulin Isophane & Regular Human (70-30) 100 UNIT/ML PEN Commonly known as:  NOVOLIN 70/30 FLEXPEN RELION Inject 10 Units into the skin 2 (two) times daily before a meal.   levETIRAcetam 500 MG tablet Commonly known as:  KEPPRA Take 250 mg by mouth at bedtime.   levofloxacin 500 MG tablet Commonly known as:  LEVAQUIN Take 1 tablet (500 mg total) by mouth every other day for 1 day. Start taking on:  03/04/2018   levothyroxine 112 MCG tablet Commonly known as:  SYNTHROID, LEVOTHROID Take 1 tablet (112 mcg total) by mouth every morning. What changed:    medication strength  how much to take   metroNIDAZOLE 500 MG tablet Commonly known as:  FLAGYL Take 1 tablet (500 mg total) by mouth every 8 (eight) hours for 2 days.   olopatadine 0.1 % ophthalmic solution Commonly  known as:  PATANOL Place 1 drop into both eyes 2 (two) times daily.   ONETOUCH DELICA LANCETS 66Z Misc   tamsulosin 0.4 MG Caps capsule Commonly known as:  FLOMAX Take 0.4 mg by mouth.   Vitamin D (Ergocalciferol) 50000 units Caps capsule Commonly known as:  DRISDOL TAKE 1 CAPSULE BY MOUTH ONCE A WEEK       Contact information for follow-up providers    Rehman, Mechele Dawley, MD. Schedule an appointment as soon as possible for a visit in 2 week(s).   Specialty:  Gastroenterology Contact information: [address], [address] [place] [address] [phone]            Contact information for after-discharge care    [facility] Follow up in 1 week(s).   Service:  Skilled Nursing Contact information: [address] [place] [phone]                 Allergies  Allergen Reactions  . Bayer Advanced Aspirin [Aspirin] Nausea And Vomiting  . Penicillins Nausea And Vomiting    Has patient had a PCN reaction causing immediate rash, facial/tongue/throat swelling, SOB or lightheadedness with hypotension: unknown Has patient had a  PCN reaction causing severe rash involving mucus membranes or skin necrosis: unknown Has patient had a PCN reaction that required hospitalization: unknown Has patient had a PCN reaction occurring within the last 10 years: no If all of the above answers are "NO", then may proceed with Cephalosporin use.        Procedures/Studies: Ct Abdomen Pelvis Wo Contrast  Result Date: 02/25/2018 CLINICAL DATA:  Fatigue with weakness and loss of appetite EXAM: CT ABDOMEN AND PELVIS WITHOUT CONTRAST TECHNIQUE: Multidetector CT imaging of the abdomen and pelvis was performed following the standard protocol without IV contrast. COMPARISON:  CT 11/24/2017, 12/01/2014 FINDINGS: Lower chest: Lung bases demonstrate no acute consolidation or pleural effusion. Trace pericardial effusion. Borderline  heart size. Hepatobiliary: Calcified granuloma in the liver. Multiple calcified stones in the gallbladder. Slightly enlarged extrahepatic common bowel duct measuring up to 9 mm. 9 mm stone in the distal common duct as before. Pancreas: Unremarkable. No pancreatic ductal dilatation or surrounding inflammatory changes. Spleen: Normal in size without focal abnormality. Adrenals/Urinary Tract: Adrenal glands are within normal limits. Hypodense lesions upper pole left kidney. Dilated extrarenal pelvis bilaterally. No ureteral stones. Slightly thick-walled bladder Stomach/Bowel: Stomach is within normal limits. Appendix appears normal. No evidence of bowel wall thickening, distention, or inflammatory changes. Sigmoid colon diverticular disease without acute inflammation Vascular/Lymphatic: Moderate aortic atherosclerosis. No aneurysmal dilatation. No significantly enlarged lymph nodes. Reproductive: Prostate calcification Other: Negative for free air or free fluid. Musculoskeletal: Degenerative changes with ankylosis at L4 and L5. IMPRESSION: 1. Multiple calcified gallstones. Linear soft tissue density at the right abdominal skin surface coursing toward the fundus of the gallbladder, felt related to prior cholecystostomy tube placement. 9 mm stone in the distal common bile duct with mild extrahepatic biliary dilatation and slight central intra hepatic biliary dilatation. 2. Similar appearance of dilated extrarenal pelvises but without ureteral stone. Slightly thick-walled appearance of the bladder 3. Sigmoid colon diverticular disease without acute inflammation. Electronically Signed   By: Donavan Foil M.D.   On: 02/25/2018 19:31   Dg Chest 2 View  Result Date: 02/25/2018 CLINICAL DATA:  Weakness. EXAM: CHEST - 2 VIEW COMPARISON:  11/24/2017 FINDINGS: Lungs are adequately inflated without focal consolidation or effusion. Borderline stable cardiomegaly. Mild calcified plaque over the aortic arch. Minimal degenerate  change of the spine. IMPRESSION: No active cardiopulmonary disease. Electronically Signed   By: Marin Olp M.D.   On: 02/25/2018 19:34   Ct Head Wo Contrast  Result Date: 02/25/2018 CLINICAL DATA:  Fatigue and generalized weakness as well as decreased appetite 2 days. Recent fall. EXAM: CT HEAD WITHOUT CONTRAST TECHNIQUE: Contiguous axial images were obtained from the base of the skull through the vertex without intravenous contrast. COMPARISON:  07/28/2016 FINDINGS: Brain: Ventricles, cisterns and other CSF spaces are within normal as there is mild age related atrophic change. There is moderate chronic ischemic microvascular disease. Small old left frontal infarct. No mass, mass effect, shift of midline structures or acute hemorrhage. No evidence of acute infarction. Basal ganglia calcifications are present. Vascular: No hyperdense vessel or unexpected calcification. Skull: Normal. Negative for fracture or focal lesion. Sinuses/Orbits: Orbits are within normal. Paranasal sinuses are clear. Mastoid air cells are clear. Other: None. IMPRESSION: No acute findings. Moderate chronic ischemic microvascular disease. Mild age related atrophic change. Small old left frontal infarct. Electronically Signed   By: Marin Olp M.D.   On: 02/25/2018 19:25   Dg Ercp With Sphincterotomy  Result Date: 03/02/2018 CLINICAL DATA:  Choledocholithiasis EXAM: ERCP TECHNIQUE: Multiple  spot images obtained with the fluoroscopic device and submitted for interpretation post-procedure. FLUOROSCOPY TIME:  Fluoroscopy Time:  3 minutes and 41 seconds Radiation Exposure Index (if provided by the fluoroscopic device): Number of Acquired Spot Images: 0 COMPARISON:  None. FINDINGS: Images demonstrate contrast filling the common bile duct. A biliary stent is in place on the final image. IMPRESSION: See above. These images were submitted for radiologic interpretation only. Please see the procedural report for the amount of contrast and the  fluoroscopy time utilized. Electronically Signed   By: Marybelle Killings M.D.   On: 03/02/2018 15:24       Subjective: Denies any abdominal pain.  Tolerating advanced diet.  Discharge Exam: Vitals:   03/03/18 0834 03/03/18 1300  BP: (!) 127/55 110/62  Pulse:  72  Resp:  18  Temp:  98.4 F (36.9 C)  SpO2:  98%   Vitals:   03/02/18 2107 03/03/18 0644 03/03/18 0834 03/03/18 1300  BP: (!) 135/50 127/85 (!) 127/55 110/62  Pulse: (!) 56 63  72  Resp:    18  Temp: 98.5 F (36.9 C) 98.3 F (36.8 C)  98.4 F (36.9 C)  TempSrc: Oral Oral  Oral  SpO2: 100% 100%  98%  Weight:  76 kg (167 lb 8.8 oz)    Height:        General: Elderly male not in distress HEENT: No pallor, no icterus, moist mucosa, supple neck Chest: Clear bilaterally, no added sound CVS: Normal S1 and S2, no murmurs GI: Soft, nondistended, nontender, bowel sounds present, scanty bilious drainage from the cholecystostomy site Musculoskeletal: Warm, no edema    The results of significant diagnostics from this hospitalization (including imaging, microbiology, ancillary and laboratory) are listed below for reference.     Microbiology: No results found for this or any previous visit (from the past 240 hour(s)).   Labs: BNP (last 3 results) Recent Labs    02/25/18 1743  BNP 017.4*   Basic Metabolic Panel: Recent Labs  Lab 02/26/18 0614 02/27/18 0710 02/28/18 0559 03/01/18 0522 03/03/18 0902  NA 139 137 134* 136 135  K 3.9 3.7 3.8 3.8 4.1  CL 110 109 107 107 106  CO2 18* 18* 18* 18* 19*  GLUCOSE 97 121* 109* 101* 104*  BUN 48* 44* 41* 37* 34*  CREATININE 3.08* 2.79* 2.67* 2.72* 2.74*  CALCIUM 8.9 8.8* 8.4* 8.6* 8.5*   Liver Function Tests: Recent Labs  Lab 02/26/18 0614 02/27/18 0710 02/28/18 0559 03/01/18 0522 03/03/18 0902  AST 202* 134* 171* 94* 47*  ALT 380* 278* 239* 181* 101*  ALKPHOS 311* 280* 274* 251* 199*  BILITOT 1.1 0.9 1.0 1.1 0.9  PROT 6.5 6.5 6.0* 6.4* 6.0*  ALBUMIN 3.0*  2.9* 2.8* 3.0* 2.9*   Recent Labs  Lab 02/25/18 1743 03/03/18 0902  LIPASE 25  --   AMYLASE  --  145*   No results for input(s): AMMONIA in the last 168 hours. CBC: Recent Labs  Lab 02/25/18 1636 02/26/18 0614 02/27/18 0710 03/03/18 0902  WBC 8.0 6.0 6.3 7.4  NEUTROABS  --  4.1  --   --   HGB 10.8* 9.4* 9.9* 10.1*  HCT 33.9* 29.5* 31.1* 30.8*  MCV 96.3 96.7 96.0 95.1  PLT 218 210 215 250   Cardiac Enzymes: Recent Labs  Lab 02/25/18 1743 02/25/18 1934 02/25/18 2349 02/26/18 0614 02/26/18 1227  CKTOTAL  --  60  --   --   --   TROPONINI 0.06*  --  0.06* 0.06* 0.05*   BNP: Invalid input(s): POCBNP CBG: Recent Labs  Lab 03/02/18 1416 03/02/18 1650 03/02/18 2153 03/03/18 0753 03/03/18 1116  GLUCAP 162* 127* 191* 108* 177*   D-Dimer No results for input(s): DDIMER in the last 72 hours. Hgb A1c No results for input(s): HGBA1C in the last 72 hours. Lipid Profile No results for input(s): CHOL, HDL, LDLCALC, TRIG, CHOLHDL, LDLDIRECT in the last 72 hours. Thyroid function studies No results for input(s): TSH, T4TOTAL, T3FREE, THYROIDAB in the last 72 hours.  Invalid input(s): FREET3 Anemia work up No results for input(s): VITAMINB12, FOLATE, FERRITIN, TIBC, IRON, RETICCTPCT in the last 72 hours. Urinalysis    Component Value Date/Time   COLORURINE YELLOW 02/25/2018 2000   APPEARANCEUR CLEAR 02/25/2018 2000   LABSPEC 1.014 02/25/2018 2000   PHURINE 5.0 02/25/2018 2000   GLUCOSEU NEGATIVE 02/25/2018 2000   HGBUR SMALL (A) 02/25/2018 2000   BILIRUBINUR NEGATIVE 02/25/2018 2000   KETONESUR NEGATIVE 02/25/2018 2000   PROTEINUR 30 (A) 02/25/2018 2000   UROBILINOGEN 0.2 11/30/2014 2359   NITRITE NEGATIVE 02/25/2018 2000   LEUKOCYTESUR NEGATIVE 02/25/2018 2000   Sepsis Labs Invalid input(s): PROCALCITONIN,  WBC,  LACTICIDVEN Microbiology No results found for this or any previous visit (from the past 240 hour(s)).   Time coordinating discharge: Over 30  minutes  SIGNED:   Louellen Molder, MD  [facility] 03/03/2018, 3:28 PM Pager   If 7PM-7AM, please contact night-coverage [email or web address] Password [email or web address]

## 2018-03-04 ENCOUNTER — Encounter (HOSPITAL_COMMUNITY): Payer: Self-pay | Admitting: Internal Medicine

## 2018-03-31 ENCOUNTER — Encounter (INDEPENDENT_AMBULATORY_CARE_PROVIDER_SITE_OTHER): Payer: Self-pay | Admitting: *Deleted

## 2018-04-06 DIAGNOSIS — N184 Chronic kidney disease, stage 4 (severe): Secondary | ICD-10-CM | POA: Diagnosis not present

## 2018-04-06 DIAGNOSIS — E119 Type 2 diabetes mellitus without complications: Secondary | ICD-10-CM | POA: Diagnosis not present

## 2018-04-06 DIAGNOSIS — N200 Calculus of kidney: Secondary | ICD-10-CM | POA: Diagnosis not present

## 2018-04-06 DIAGNOSIS — E038 Other specified hypothyroidism: Secondary | ICD-10-CM | POA: Diagnosis not present

## 2018-04-18 ENCOUNTER — Encounter (INDEPENDENT_AMBULATORY_CARE_PROVIDER_SITE_OTHER): Payer: Self-pay | Admitting: *Deleted

## 2018-04-18 ENCOUNTER — Other Ambulatory Visit (INDEPENDENT_AMBULATORY_CARE_PROVIDER_SITE_OTHER): Payer: Self-pay | Admitting: *Deleted

## 2018-04-18 DIAGNOSIS — K8050 Calculus of bile duct without cholangitis or cholecystitis without obstruction: Secondary | ICD-10-CM | POA: Insufficient documentation

## 2018-04-18 NOTE — Telephone Encounter (Signed)
This encounter was created in error - please disregard.

## 2018-04-19 ENCOUNTER — Telehealth: Payer: Self-pay | Admitting: Family Medicine

## 2018-04-19 NOTE — Telephone Encounter (Signed)
Verbal ok given.

## 2018-04-19 NOTE — Telephone Encounter (Signed)
Ann from [facility] office left a voicemail yesterday afternoon stating that patient has a procedure with Dr.Rehman on 05/27/18, needs to stop aspirin 2 days prior to procedure and plavix 5 days prior.  She is making sure its okay for patient to stop these medications.  Cb#: [id number]/ [id number]

## 2018-05-12 DIAGNOSIS — L110 Acquired keratosis follicularis: Secondary | ICD-10-CM | POA: Diagnosis not present

## 2018-05-12 DIAGNOSIS — E1140 Type 2 diabetes mellitus with diabetic neuropathy, unspecified: Secondary | ICD-10-CM | POA: Diagnosis not present

## 2018-05-12 DIAGNOSIS — M79671 Pain in right foot: Secondary | ICD-10-CM | POA: Diagnosis not present

## 2018-05-12 DIAGNOSIS — E1151 Type 2 diabetes mellitus with diabetic peripheral angiopathy without gangrene: Secondary | ICD-10-CM | POA: Diagnosis not present

## 2018-05-18 NOTE — Patient Instructions (Signed)
RAESHAWN VO  05/18/2018     @PREFPERIOPPHARMACY @   Your procedure is scheduled on  05/27/2018   Report to [facility] at  855   A.M.  Call this number if you have problems the morning of surgery:  [phone]   Remember:  Do not eat or drink after midnight.  You may drink clear liquids until (follow the diet instructions given to you by Dr Roseanne Kaufman office) .  Clear liquids allowed are:                    Water, Juice (non-citric and without pulp), Carbonated beverages, Clear Tea, Black Coffee only, Plain Jell-O only, Gatorade and Plain Popsicles only    Take these medicines the morning of surgery with A SIP OF WATER Amlodipine, levothyroxine, flomax.    Do not wear jewelry, make-up or nail polish.  Do not wear lotions, powders, or perfumes, or deodorant.  Do not shave 48 hours prior to surgery.  Men may shave face and neck.  Do not bring valuables to the hospital.  [facility] is not responsible for any belongings or valuables.  Contacts, dentures or bridgework may not be worn into surgery.  Leave your suitcase in the car.  After surgery it may be brought to your room.  For patients admitted to the hospital, discharge time will be determined by your treatment team.  Patients discharged the day of surgery will not be allowed to drive home.   Name and phone number of your driver:   family Special instructions:  Follow the diet and prep instructions given to you by Dr Roseanne Kaufman office.  Please read over the following fact sheets that you were given. Anesthesia Post-op Instructions and Care and Recovery After Surgery       Endoscopic Retrograde Cholangiopancreatogram Endoscopic retrograde cholangiopancreatogram (ERCP) is a procedure that may be used to diagnose or treat problems with the pancreas, bile ducts, liver, and gallbladder. For this procedure, a thin, lighted tube (endoscope) is passed through the mouth, the throat, and down into the areas being checked.  The endoscope has a camera that allows the areas to be viewed. Dye is injected and then X-rays are taken to further study the areas. During ERCP, other procedures may also be done to help diagnose or treat problems that are found. For example, stones can be removed, or a tissue sample can be taken out for testing (biopsy). Tell a health care provider about:  Any allergies you have.  All medicines you are taking, including vitamins, herbs, eye drops, creams, and over-the-counter medicines.  Any problems you or family members have had with anesthetic medicines.  Any blood disorders you have.  Any surgeries you have had.  Any medical conditions you have.  Whether you are pregnant or may be pregnant. What are the risks? Generally, this is a safe procedure. However, problems may occur, including:  Pancreatitis.  Infection.  Bleeding.  Allergic reactions to medicines or dyes.  Accidental punctures in the bowel wall, pancreas, or gallbladder.  Damage to other structures or organs.  What happens before the procedure? Staying hydrated Follow instructions from your health care provider about hydration, which may include:  Up to 2 hours before the procedure - you may continue to drink clear liquids, such as water, clear fruit juice, black coffee, and plain tea.  Eating and drinking restrictions Follow instructions from your health care provider about eating and drinking, which  may include:  8 hours before the procedure - stop eating heavy meals or foods such as meat, fried foods, or fatty foods.  6 hours before the procedure - stop eating light meals or foods, such as toast or cereal.  6 hours before the procedure - stop drinking milk or drinks that contain milk.  2 hours before the procedure - stop drinking clear liquids.  General instructions  Ask your health care provider about: ? Changing or stopping your regular medicines. This is especially important if you are taking  diabetes medicines or blood thinners. ? Taking medicines such as aspirin and ibuprofen. These medicines can thin your blood. Do not take these medicines before your procedure if your health care provider instructs you not to.  Plan to have someone take you home from the hospital or clinic.  If you will be going home right after the procedure, plan to have someone with you for 24 hours. What happens during the procedure?  To lower your risk of infection, your health care team will wash or sanitize their hands.  An IV tube will be inserted into one of your veins.  You will be given one or more of the following: ? A medicine to help you relax (sedative). ? A medicine to numb the throat area (local anesthetic) and prevent gagging. Your throat may be sprayed with this medicine, or you may gargle the medicine. ? A medicine to make you fall asleep (general anesthetic). ? A medicine to lower your risk of infection (antibiotic), inflammation (anti-inflammatory), or both.  You will lie on your left side.  The endoscope will be inserted through your mouth, down the back of the throat, and into the first part of the small intestine (duodenum).  Then a small, plastic tube (cannula) will be passed through the endoscope and directed into the bile duct or pancreatic duct.  Dye will be injected through the cannula to make structures easier to see on an X-ray.  X-rays will be taken to study the biliary and pancreatic passageways. You may be positioned on your abdomen or your back during the X-rays.  A small sample of tissue (biopsy) may be removed for examination, or other procedures may be done to fix problems that are found. The procedure may vary among health care providers and hospitals. What happens after the procedure?  Your blood pressure, heart rate, breathing rate, and blood oxygen level will be monitored until the medicines you were given have worn off.  Your throat may feel slightly  sore.  You will not be allowed to eat or drink until numbness subsides.  Do not drive for 24 hours if you were given a sedative. Summary  Endoscopic retrograde cholangiopancreatogram is a procedure that may be used to diagnose or treat problems with the pancreas, bile ducts, liver, and gallbladder.  During ERCP, other procedures may also be done to help diagnose or treat problems that are found. For example, stones can be removed, or a tissue sample can be taken out for testing (biopsy).  Generally, this is a safe procedure. However, problems may occur, including infection, bleeding, pancreatitis, accidental damage to other structures or organs, and allergic reactions to medicines or dyes.  The procedure may vary among health care providers and hospitals. This information is not intended to replace advice given to you by your health care provider. Make sure you discuss any questions you have with your health care provider. Document Released: 08/18/2001 Document Revised: 10/19/2016 Document Reviewed: 10/19/2016 Elsevier Interactive  Patient Education  2017 [place].  Endoscopic Retrograde Cholangiopancreatogram, Care After This sheet gives you information about how to care for yourself after your procedure. Your health care provider may also give you more specific instructions. If you have problems or questions, contact your health care provider. What can I expect after the procedure? After the procedure, it is common to have:  Soreness in your throat.  Nausea.  Bloating.  Dizziness.  Tiredness (fatigue).  Follow these instructions at home:  Take over-the-counter and prescription medicines only as told by your health care provider.  Do not drive for 24 hours if you were given a medicine to help you relax (sedative) during your procedure. Have someone stay with you for 24 hours after the procedure.  Return to your normal activities as told by your health care provider. Ask  your health care provider what activities are safe for you.  Return to eating what you normally do as soon as you feel well enough or as told by your health care provider.  Keep all follow-up visits as told by your health care provider. This is important. Contact a health care provider if:  You have pain in your abdomen that does not get better with medicine.  You develop signs of infection, such as: ? Chills. ? Feeling unwell. Get help right away if:  You have difficulty swallowing.  You have worsening pain in your throat, chest, or abdomen.  You vomit bright red blood or a substance that looks like coffee grounds.  You have bloody or very black stools.  You have a fever.  You have a sudden increase in swelling (bloating) in your abdomen. Summary  After the procedure, it is common to feel tired and to have some discomfort in your throat.  Contact your health care provider if you have signs of infection-such as chills or feeling unwell-or if you have pain that does not improve with medicine.  Get help right away if you have trouble swallowing, worsening pain, bloody or black vomit, bloody or black stools, a fever, or increased swelling in your abdomen.  Keep all follow-up visits as told by your health care provider. This is important. This information is not intended to replace advice given to you by your health care provider. Make sure you discuss any questions you have with your health care provider. Document Released: 09/13/2013 Document Revised: 10/12/2016 Document Reviewed: 10/12/2016 Elsevier Interactive Patient Education  2017 [place] Anesthesia, Adult General anesthesia is the use of medicines to make a person "go to sleep" (be unconscious) for a medical procedure. General anesthesia is often recommended when a procedure:  Is long.  Requires you to be still or in an unusual position.  Is major and can cause you to lose blood.  Is impossible to do  without general anesthesia.  The medicines used for general anesthesia are called general anesthetics. In addition to making you sleep, the medicines:  Prevent pain.  Control your blood pressure.  Relax your muscles.  Tell a health care provider about:  Any allergies you have.  All medicines you are taking, including vitamins, herbs, eye drops, creams, and over-the-counter medicines.  Any problems you or family members have had with anesthetic medicines.  Types of anesthetics you have had in the past.  Any bleeding disorders you have.  Any surgeries you have had.  Any medical conditions you have.  Any history of heart or lung conditions, such as heart failure, sleep apnea, or chronic obstructive  pulmonary disease (COPD).  Whether you are pregnant or may be pregnant.  Whether you use tobacco, alcohol, marijuana, or street drugs.  Any history of Armed forces logistics/support/administrative officer.  Any history of depression or anxiety. What are the risks? Generally, this is a safe procedure. However, problems may occur, including:  Allergic reaction to anesthetics.  Lung and heart problems.  Inhaling food or liquids from your stomach into your lungs (aspiration).  Injury to nerves.  Waking up during your procedure and being unable to move (rare).  Extreme agitation or a state of mental confusion (delirium) when you wake up from the anesthetic.  Air in the bloodstream, which can lead to stroke.  These problems are more likely to develop if you are having a major surgery or if you have an advanced medical condition. You can prevent some of these complications by answering all of your health care provider's questions thoroughly and by following all pre-procedure instructions. General anesthesia can cause side effects, including:  Nausea or vomiting  A sore throat from the breathing tube.  Feeling cold or shivery.  Feeling tired, washed out, or achy.  Sleepiness or drowsiness.  Confusion or  agitation.  What happens before the procedure? Staying hydrated Follow instructions from your health care provider about hydration, which may include:  Up to 2 hours before the procedure - you may continue to drink clear liquids, such as water, clear fruit juice, black coffee, and plain tea.  Eating and drinking restrictions Follow instructions from your health care provider about eating and drinking, which may include:  8 hours before the procedure - stop eating heavy meals or foods such as meat, fried foods, or fatty foods.  6 hours before the procedure - stop eating light meals or foods, such as toast or cereal.  6 hours before the procedure - stop drinking milk or drinks that contain milk.  2 hours before the procedure - stop drinking clear liquids.  Medicines  Ask your health care provider about: ? Changing or stopping your regular medicines. This is especially important if you are taking diabetes medicines or blood thinners. ? Taking medicines such as aspirin and ibuprofen. These medicines can thin your blood. Do not take these medicines before your procedure if your health care provider instructs you not to. ? Taking new dietary supplements or medicines. Do not take these during the week before your procedure unless your health care provider approves them.  If you are told to take a medicine or to continue taking a medicine on the day of the procedure, take the medicine with sips of water. General instructions   Ask if you will be going home the same day, the following day, or after a longer hospital stay. ? Plan to have someone take you home. ? Plan to have someone stay with you for the first 24 hours after you leave the hospital or clinic.  For 3-6 weeks before the procedure, try not to use any tobacco products, such as cigarettes, chewing tobacco, and e-cigarettes.  You may brush your teeth on the morning of the procedure, but make sure to spit out the toothpaste. What  happens during the procedure?  You will be given anesthetics through a mask and through an IV tube in one of your veins.  You may receive medicine to help you relax (sedative).  As soon as you are asleep, a breathing tube may be used to help you breathe.  An anesthesia specialist will stay with you throughout the procedure. He  or she will help keep you comfortable and safe by continuing to give you medicines and adjusting the amount of medicine that you get. He or she will also watch your blood pressure, pulse, and oxygen levels to make sure that the anesthetics do not cause any problems.  If a breathing tube was used to help you breathe, it will be removed before you wake up. The procedure may vary among health care providers and hospitals. What happens after the procedure?  You will wake up, often slowly, after the procedure is complete, usually in a recovery area.  Your blood pressure, heart rate, breathing rate, and blood oxygen level will be monitored until the medicines you were given have worn off.  You may be given medicine to help you calm down if you feel anxious or agitated.  If you will be going home the same day, your health care provider may check to make sure you can stand, drink, and urinate.  Your health care providers will treat your pain and side effects before you go home.  Do not drive for 24 hours if you received a sedative.  You may: ? Feel nauseous and vomit. ? Have a sore throat. ? Have mental slowness. ? Feel cold or shivery. ? Feel sleepy. ? Feel tired. ? Feel sore or achy, even in parts of your body where you did not have surgery. This information is not intended to replace advice given to you by your health care provider. Make sure you discuss any questions you have with your health care provider. Document Released: 03/01/2008 Document Revised: 05/05/2016 Document Reviewed: 11/07/2015 Elsevier Interactive Patient Education  2018 [place]  Anesthesia, Adult, Care After These instructions provide you with information about caring for yourself after your procedure. Your health care provider may also give you more specific instructions. Your treatment has been planned according to current medical practices, but problems sometimes occur. Call your health care provider if you have any problems or questions after your procedure. What can I expect after the procedure? After the procedure, it is common to have:  Vomiting.  A sore throat.  Mental slowness.  It is common to feel:  Nauseous.  Cold or shivery.  Sleepy.  Tired.  Sore or achy, even in parts of your body where you did not have surgery.  Follow these instructions at home: For at least 24 hours after the procedure:  Do not: ? Participate in activities where you could fall or become injured. ? Drive. ? Use heavy machinery. ? Drink alcohol. ? Take sleeping pills or medicines that cause drowsiness. ? Make important decisions or sign legal documents. ? Take care of children on your own.  Rest. Eating and drinking  If you vomit, drink water, juice, or soup when you can drink without vomiting.  Drink enough fluid to keep your urine clear or pale yellow.  Make sure you have little or no nausea before eating solid foods.  Follow the diet recommended by your health care provider. General instructions  Have a responsible adult stay with you until you are awake and alert.  Return to your normal activities as told by your health care provider. Ask your health care provider what activities are safe for you.  Take over-the-counter and prescription medicines only as told by your health care provider.  If you smoke, do not smoke without supervision.  Keep all follow-up visits as told by your health care provider. This is important. Contact a health care provider if:  You continue to have nausea or vomiting at home, and medicines are not helpful.  You cannot  drink fluids or start eating again.  You cannot urinate after 8-12 hours.  You develop a skin rash.  You have fever.  You have increasing redness at the site of your procedure. Get help right away if:  You have difficulty breathing.  You have chest pain.  You have unexpected bleeding.  You feel that you are having a life-threatening or urgent problem. This information is not intended to replace advice given to you by your health care provider. Make sure you discuss any questions you have with your health care provider. Document Released: 03/01/2001 Document Revised: 04/27/2016 Document Reviewed: 11/07/2015 Elsevier Interactive Patient Education  [employer].

## 2018-05-20 DIAGNOSIS — N184 Chronic kidney disease, stage 4 (severe): Secondary | ICD-10-CM | POA: Diagnosis not present

## 2018-05-20 DIAGNOSIS — E119 Type 2 diabetes mellitus without complications: Secondary | ICD-10-CM | POA: Diagnosis not present

## 2018-05-20 DIAGNOSIS — E038 Other specified hypothyroidism: Secondary | ICD-10-CM | POA: Diagnosis not present

## 2018-05-20 DIAGNOSIS — I10 Essential (primary) hypertension: Secondary | ICD-10-CM | POA: Diagnosis not present

## 2018-05-23 ENCOUNTER — Other Ambulatory Visit: Payer: Self-pay

## 2018-05-23 ENCOUNTER — Encounter (HOSPITAL_COMMUNITY)
Admission: RE | Admit: 2018-05-23 | Discharge: 2018-05-23 | Disposition: A | Discharge status: Home / Self Care | Payer: No Typology Code available for payment source | Source: Ambulatory Visit | Attending: Internal Medicine | Admitting: Internal Medicine

## 2018-05-23 ENCOUNTER — Encounter (HOSPITAL_COMMUNITY): Payer: Self-pay

## 2018-05-23 DIAGNOSIS — Z01812 Encounter for preprocedural laboratory examination: Secondary | ICD-10-CM | POA: Diagnosis not present

## 2018-05-23 DIAGNOSIS — K8050 Calculus of bile duct without cholangitis or cholecystitis without obstruction: Secondary | ICD-10-CM | POA: Diagnosis not present

## 2018-05-23 HISTORY — DX: Personal history of urinary calculi: Z87.442

## 2018-05-23 LAB — CBC
HCT: 37.2 % — ABNORMAL LOW (ref 39.0–52.0)
Hemoglobin: 11.6 g/dL — ABNORMAL LOW (ref 13.0–17.0)
MCH: 31.2 pg (ref 26.0–34.0)
MCHC: 31.2 g/dL (ref 30.0–36.0)
MCV: 100 fL (ref 78.0–100.0)
RBC: 3.72 MIL/uL — ABNORMAL LOW (ref 4.22–5.81)
RDW: 13.3 % (ref 11.5–15.5)
WBC: 6.5 10*3/uL (ref 4.0–10.5)

## 2018-05-23 NOTE — Progress Notes (Signed)
   05/23/18 1352  OBSTRUCTIVE SLEEP APNEA  Have you ever been diagnosed with sleep apnea through a sleep study? No  Do you snore loudly (loud enough to be heard through closed doors)?  1  Do you often feel tired, fatigued, or sleepy during the daytime (such as falling asleep during driving or talking to someone)? 1  Has anyone observed you stop breathing during your sleep? 0  Do you have, or are you being treated for high blood pressure? 1  BMI more than 35 kg/m2? 0  Age > 50 (1-yes) 1  Neck circumference greater than:Male 16 inches or larger, Male 17inches or larger? 0  Male Gender (Yes=1) 1  Obstructive Sleep Apnea Score 5

## 2018-05-26 ENCOUNTER — Telehealth: Payer: Self-pay | Admitting: Family Medicine

## 2018-05-26 DIAGNOSIS — G40209 Localization-related (focal) (partial) symptomatic epilepsy and epileptic syndromes with complex partial seizures, not intractable, without status epilepticus: Secondary | ICD-10-CM | POA: Diagnosis not present

## 2018-05-26 DIAGNOSIS — E1165 Type 2 diabetes mellitus with hyperglycemia: Secondary | ICD-10-CM | POA: Diagnosis not present

## 2018-05-26 DIAGNOSIS — Z7982 Long term (current) use of aspirin: Secondary | ICD-10-CM | POA: Diagnosis not present

## 2018-05-26 DIAGNOSIS — Z794 Long term (current) use of insulin: Secondary | ICD-10-CM | POA: Diagnosis not present

## 2018-05-26 DIAGNOSIS — D631 Anemia in chronic kidney disease: Secondary | ICD-10-CM | POA: Diagnosis not present

## 2018-05-26 DIAGNOSIS — H547 Unspecified visual loss: Secondary | ICD-10-CM | POA: Diagnosis not present

## 2018-05-26 DIAGNOSIS — I129 Hypertensive chronic kidney disease with stage 1 through stage 4 chronic kidney disease, or unspecified chronic kidney disease: Secondary | ICD-10-CM | POA: Diagnosis not present

## 2018-05-26 DIAGNOSIS — E1122 Type 2 diabetes mellitus with diabetic chronic kidney disease: Secondary | ICD-10-CM | POA: Diagnosis not present

## 2018-05-26 DIAGNOSIS — N184 Chronic kidney disease, stage 4 (severe): Secondary | ICD-10-CM | POA: Diagnosis not present

## 2018-05-26 NOTE — Telephone Encounter (Signed)
Needs verbal for nurse to go out tomorrow & help wife with medications. Verbal for 9 week order. Needs to speak w/ nurse to clarify medication changes.

## 2018-05-26 NOTE — Telephone Encounter (Signed)
Maria from [employer] is calling for urgent medication questions. Cb#: [phone]/ [phone]

## 2018-05-26 NOTE — Telephone Encounter (Signed)
Spoke with Verdis Frederickson and gave verbal order. She stated wife is very confused regarding medications and she wants nurse to go back out on Saturday to make sure wife is understanding what medications are to be given and when.

## 2018-05-27 ENCOUNTER — Encounter (HOSPITAL_COMMUNITY)
Admission: RE | Disposition: A | Discharge status: Home / Self Care | Payer: Self-pay | Source: Ambulatory Visit | Attending: Internal Medicine

## 2018-05-27 ENCOUNTER — Ambulatory Visit (HOSPITAL_COMMUNITY)
Admission: RE | Admit: 2018-05-27 | Discharge: 2018-05-27 | Disposition: A | Discharge status: Home / Self Care | Payer: Medicare Other | Source: Ambulatory Visit | Attending: Internal Medicine | Admitting: Internal Medicine

## 2018-05-27 ENCOUNTER — Ambulatory Visit (HOSPITAL_COMMUNITY): Payer: Medicare Other | Admitting: Anesthesiology

## 2018-05-27 ENCOUNTER — Ambulatory Visit (HOSPITAL_COMMUNITY): Payer: Medicare Other

## 2018-05-27 ENCOUNTER — Encounter (HOSPITAL_COMMUNITY): Payer: Self-pay | Admitting: Anesthesiology

## 2018-05-27 DIAGNOSIS — E1122 Type 2 diabetes mellitus with diabetic chronic kidney disease: Secondary | ICD-10-CM | POA: Insufficient documentation

## 2018-05-27 DIAGNOSIS — N281 Cyst of kidney, acquired: Secondary | ICD-10-CM | POA: Diagnosis not present

## 2018-05-27 DIAGNOSIS — Z4659 Encounter for fitting and adjustment of other gastrointestinal appliance and device: Secondary | ICD-10-CM | POA: Diagnosis not present

## 2018-05-27 DIAGNOSIS — K8050 Calculus of bile duct without cholangitis or cholecystitis without obstruction: Secondary | ICD-10-CM

## 2018-05-27 DIAGNOSIS — I69351 Hemiplegia and hemiparesis following cerebral infarction affecting right dominant side: Secondary | ICD-10-CM | POA: Diagnosis not present

## 2018-05-27 DIAGNOSIS — Z79899 Other long term (current) drug therapy: Secondary | ICD-10-CM | POA: Insufficient documentation

## 2018-05-27 DIAGNOSIS — R569 Unspecified convulsions: Secondary | ICD-10-CM | POA: Diagnosis not present

## 2018-05-27 DIAGNOSIS — E039 Hypothyroidism, unspecified: Secondary | ICD-10-CM | POA: Insufficient documentation

## 2018-05-27 DIAGNOSIS — K838 Other specified diseases of biliary tract: Secondary | ICD-10-CM | POA: Insufficient documentation

## 2018-05-27 DIAGNOSIS — Z794 Long term (current) use of insulin: Secondary | ICD-10-CM | POA: Diagnosis not present

## 2018-05-27 DIAGNOSIS — Z9889 Other specified postprocedural states: Secondary | ICD-10-CM

## 2018-05-27 DIAGNOSIS — N1330 Unspecified hydronephrosis: Secondary | ICD-10-CM

## 2018-05-27 DIAGNOSIS — N184 Chronic kidney disease, stage 4 (severe): Secondary | ICD-10-CM | POA: Diagnosis not present

## 2018-05-27 DIAGNOSIS — I1310 Hypertensive heart and chronic kidney disease without heart failure, with stage 1 through stage 4 chronic kidney disease, or unspecified chronic kidney disease: Secondary | ICD-10-CM | POA: Diagnosis not present

## 2018-05-27 DIAGNOSIS — K8051 Calculus of bile duct without cholangitis or cholecystitis with obstruction: Secondary | ICD-10-CM | POA: Insufficient documentation

## 2018-05-27 DIAGNOSIS — E1140 Type 2 diabetes mellitus with diabetic neuropathy, unspecified: Secondary | ICD-10-CM | POA: Insufficient documentation

## 2018-05-27 DIAGNOSIS — Z79890 Hormone replacement therapy (postmenopausal): Secondary | ICD-10-CM | POA: Diagnosis not present

## 2018-05-27 DIAGNOSIS — Z7982 Long term (current) use of aspirin: Secondary | ICD-10-CM | POA: Insufficient documentation

## 2018-05-27 DIAGNOSIS — E1151 Type 2 diabetes mellitus with diabetic peripheral angiopathy without gangrene: Secondary | ICD-10-CM | POA: Insufficient documentation

## 2018-05-27 DIAGNOSIS — K831 Obstruction of bile duct: Secondary | ICD-10-CM | POA: Diagnosis not present

## 2018-05-27 HISTORY — PX: SPYGLASS CHOLANGIOSCOPY: SHX5441

## 2018-05-27 HISTORY — PX: GASTROINTESTINAL STENT REMOVAL: SHX6384

## 2018-05-27 HISTORY — PX: SPHINCTEROTOMY: SHX5544

## 2018-05-27 HISTORY — PX: BILIARY STENT PLACEMENT: SHX5538

## 2018-05-27 HISTORY — PX: ERCP: SHX5425

## 2018-05-27 HISTORY — PX: LITHOTRIPSY: SHX5546

## 2018-05-27 LAB — COMPREHENSIVE METABOLIC PANEL
ALT: 20 U/L (ref 17–63)
AST: 23 U/L (ref 15–41)
Albumin: 3.7 g/dL (ref 3.5–5.0)
Alkaline Phosphatase: 118 U/L (ref 38–126)
Anion gap: 9 (ref 5–15)
BUN: 70 mg/dL — ABNORMAL HIGH (ref 6–20)
CO2: 23 mmol/L (ref 22–32)
Calcium: 9.2 mg/dL (ref 8.9–10.3)
Chloride: 109 mmol/L (ref 101–111)
Creatinine, Ser: 3.48 mg/dL — ABNORMAL HIGH (ref 0.61–1.24)
GFR calc Af Amer: 17 mL/min — ABNORMAL LOW (ref >60)
GFR calc non Af Amer: 15 mL/min — ABNORMAL LOW (ref >60)
Glucose, Bld: 143 mg/dL — ABNORMAL HIGH (ref 65–99)
Potassium: 4.4 mmol/L (ref 3.5–5.1)
Sodium: 141 mmol/L (ref 135–145)
Total Bilirubin: 0.6 mg/dL (ref 0.3–1.2)
Total Protein: 7.6 g/dL (ref 6.5–8.1)

## 2018-05-27 SURGERY — ERCP, WITH INTERVENTION IF INDICATED
Anesthesia: General | Site: Esophagus

## 2018-05-27 MED ORDER — STERILE WATER FOR IRRIGATION IR SOLN
Status: DC | PRN
  Administered 2018-05-27: 1000 mL

## 2018-05-27 MED ORDER — FENTANYL CITRATE (PF) 100 MCG/2ML IJ SOLN: 25.0000 ug | INTRAMUSCULAR | Status: DC | PRN

## 2018-05-27 MED ORDER — LEVOFLOXACIN IN D5W 500 MG/100ML IV SOLN
INTRAVENOUS | Status: AC
  Filled 2018-05-27: qty 100

## 2018-05-27 MED ORDER — EPHEDRINE SULFATE 50 MG/ML IJ SOLN
INTRAMUSCULAR | Status: AC
  Filled 2018-05-27: qty 2

## 2018-05-27 MED ORDER — PROPOFOL 10 MG/ML IV BOLUS
INTRAVENOUS | Status: AC
  Filled 2018-05-27: qty 40

## 2018-05-27 MED ORDER — ONDANSETRON HCL 4 MG/2ML IJ SOLN
INTRAMUSCULAR | Status: AC
  Filled 2018-05-27: qty 2

## 2018-05-27 MED ORDER — HYDROCODONE-ACETAMINOPHEN 7.5-325 MG PO TABS: 1.0000 | ORAL_TABLET | Freq: Once | ORAL | Status: DC | PRN

## 2018-05-27 MED ORDER — LACTATED RINGERS IV SOLN
INTRAVENOUS | Status: DC
  Administered 2018-05-27: 1000 mL via INTRAVENOUS
  Administered 2018-05-27: 13:00:00 via INTRAVENOUS

## 2018-05-27 MED ORDER — PROPOFOL 10 MG/ML IV BOLUS
INTRAVENOUS | Status: AC
  Filled 2018-05-27: qty 20

## 2018-05-27 MED ORDER — CHLORHEXIDINE GLUCONATE CLOTH 2 % EX PADS: 6.0000 | MEDICATED_PAD | Freq: Once | CUTANEOUS | Status: DC

## 2018-05-27 MED ORDER — LEVOFLOXACIN IN D5W 500 MG/100ML IV SOLN: 500.0000 mg | Freq: Once | INTRAVENOUS | Status: DC

## 2018-05-27 MED ORDER — ROCURONIUM BROMIDE 50 MG/5ML IV SOLN
INTRAVENOUS | Status: AC
Start: 2018-05-27 — End: ?
  Filled 2018-05-27: qty 1

## 2018-05-27 MED ORDER — SODIUM CHLORIDE 0.9 % IJ SOLN
INTRAMUSCULAR | Status: AC
  Filled 2018-05-27: qty 10

## 2018-05-27 MED ORDER — ONDANSETRON HCL 4 MG/2ML IJ SOLN
INTRAMUSCULAR | Status: DC | PRN
  Administered 2018-05-27: 4 mg via INTRAVENOUS

## 2018-05-27 MED ORDER — FENTANYL CITRATE (PF) 100 MCG/2ML IJ SOLN
INTRAMUSCULAR | Status: AC
  Filled 2018-05-27: qty 2

## 2018-05-27 MED ORDER — LEVOFLOXACIN IN D5W 500 MG/100ML IV SOLN
INTRAVENOUS | Status: DC | PRN
  Administered 2018-05-27: 500 mg via INTRAVENOUS

## 2018-05-27 MED ORDER — EPHEDRINE SULFATE 50 MG/ML IJ SOLN
INTRAMUSCULAR | Status: AC
  Filled 2018-05-27: qty 1

## 2018-05-27 MED ORDER — FENTANYL CITRATE (PF) 100 MCG/2ML IJ SOLN
INTRAMUSCULAR | Status: DC | PRN
  Administered 2018-05-27: 50 ug via INTRAVENOUS

## 2018-05-27 MED ORDER — GLUCAGON HCL RDNA (DIAGNOSTIC) 1 MG IJ SOLR
INTRAMUSCULAR | Status: AC
  Filled 2018-05-27: qty 1

## 2018-05-27 MED ORDER — PROPOFOL 10 MG/ML IV BOLUS
INTRAVENOUS | Status: DC | PRN
  Administered 2018-05-27: 100 mg via INTRAVENOUS

## 2018-05-27 MED ORDER — SUCCINYLCHOLINE CHLORIDE 20 MG/ML IJ SOLN
INTRAMUSCULAR | Status: DC | PRN
  Administered 2018-05-27: 100 mg via INTRAVENOUS

## 2018-05-27 MED ORDER — IOPAMIDOL (ISOVUE-300) INJECTION 61%
INTRAVENOUS | Status: DC | PRN
  Administered 2018-05-27: 50 mL

## 2018-05-27 MED ORDER — EPHEDRINE SULFATE 50 MG/ML IJ SOLN
INTRAMUSCULAR | Status: DC | PRN
  Administered 2018-05-27 (×2): 5 mg via INTRAVENOUS

## 2018-05-27 MED ORDER — SUCCINYLCHOLINE CHLORIDE 20 MG/ML IJ SOLN
INTRAMUSCULAR | Status: AC
  Filled 2018-05-27: qty 1

## 2018-05-27 MED ORDER — GLUCAGON HCL RDNA (DIAGNOSTIC) 1 MG IJ SOLR
INTRAMUSCULAR | Status: DC | PRN
  Administered 2018-05-27 (×2): 0.25 mg via INTRAVENOUS

## 2018-05-27 NOTE — Anesthesia Procedure Notes (Addendum)
Procedure Name: Intubation Date/Time: 05/27/2018 12:08 PM Performed by: Andree Elk, Amy A, CRNA Pre-anesthesia Checklist: Patient identified, Emergency Drugs available, Suction available, Patient being monitored and Timeout performed Patient Re-evaluated:Patient Re-evaluated prior to induction Oxygen Delivery Method: Circle system utilized Preoxygenation: Pre-oxygenation with 100% oxygen Induction Type: IV induction, Rapid sequence and Cricoid Pressure applied Laryngoscope Size: Glidescope (Lopro 3) Grade View: Grade I Tube type: Oral Tube size: 7.0 mm Number of attempts: 1 Placement Confirmation: positive ETCO2,  ETT inserted through vocal cords under direct vision and breath sounds checked- equal and bilateral Tube secured with: Tape Dental Injury: Teeth and Oropharynx as per pre-operative assessment

## 2018-05-27 NOTE — H&P (Signed)
Chase [name] is an 84 y.o. male.   Chief Complaint: Patient is here for ERCP. HPI: Patient is 84 year old Afro-American male with multiple medical problems who was hospitalized with acute cholecystitis over 3 years ago.  He was deemed to be too high risk for cholecystectomy and underwent cholecystostomy.  Cholecystostomy was removed later on.  He has continued to have intermittent biliary leakage from cholecystostomy sites.  patient presented to this facility about 3 months ago with failure to thrive.  He also reported increase in drainage of bile duct from cholecystostomy site.  His LFTs were abnormal.  He was felt to have choledocholithiasis on CT.  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  Therefore biliary stent was placed.  Patient was transferred to nursing home.  He is gradually improved to the point that he was discharged 2 days ago.  According to his daughters [name] and [name] his appetite is been good.  He has urinary incontinence.  He uses depends.  He has not complained of abdominal pain and he has not had leakage from cholecystostomy site anymore. He is returning for repeat ERCP with removal of biliary stent choledochoscopy and removal of stone if possible.  Past Medical History:  Diagnosis Date  . Bradycardia 03/15/2012  . Carotid artery occlusion   . Choledocholithiasis 02/25/2018  . CKD (chronic kidney disease) stage 4, GFR 15-29 ml/min (HCC)   . Complete lesion of cervical spinal cord ([place]) 03/14/3012   Stable since 2006  . CVA (cerebrovascular accident) ([place]) 09/07/12   right sided weakness  . Depressive disorder, not elsewhere classified   . Diabetes mellitus approx 1994  . Diabetic neuropathy ([place])   . History of kidney stones   . Hypertensive heart disease   . Hypothyroidism approx 2000  . Lacunar stroke, acute ([place]) 03/14/2012  . Obesity   . Osteoarthrosis, unspecified whether generalized or localized, lower leg   . Other and unspecified  hyperlipidemia   . Peripheral vascular disease, unspecified ([place])   . Seizures ([place])    unknown etiology; on meds, last seizure was 2015  . Spinal stenosis, unspecified region other than cervical   . Spondylosis of unspecified site without mention of myelopathy     Past Surgical History:  Procedure Laterality Date  . COLONOSCOPY N/A 08/10/2013   Procedure: COLONOSCOPY;  Surgeon: Rogene Houston, MD;  Location: [facility];  Service: Endoscopy;  Laterality: N/A;  240  . ERCP N/A 03/02/2018   Procedure: ENDOSCOPIC RETROGRADE CHOLANGIOPANCREATOGRAPHY (ERCP) With sphincterotomy and stent placement;  Surgeon: Rogene Houston, MD;  Location: [facility];  Service: Gastroenterology;  Laterality: N/A;  . kidney stones left x2  1975  . KIDNEY SURGERY     Ruptured left kidney 30 yrs ago  from a kidney stone    Family History  Problem Relation Age of Onset  . Diabetes Mother   . Prostate cancer Father   . Hypertension Brother   . Hypertension Brother   . Diabetes Brother   . Stroke Brother   . Diabetes Brother   . Diabetes Daughter   . Diabetes Daughter    Social History:  reports that he has never smoked. He has never used smokeless tobacco. He reports that he does not drink alcohol or use drugs.  Allergies:  Allergies  Allergen Reactions  . Bayer Advanced Aspirin [Aspirin] Nausea And Vomiting  . Penicillins Nausea And Vomiting    Has patient had a PCN reaction causing immediate  rash, facial/tongue/throat swelling, SOB or lightheadedness with hypotension: unknown Has patient had a PCN reaction causing severe rash involving mucus membranes or skin necrosis: unknown Has patient had a PCN reaction that required hospitalization: unknown Has patient had a PCN reaction occurring within the last 10 years: no If all of the above answers are "NO", then may proceed with Cephalosporin use.    Medications Prior to Admission  Medication Sig Dispense Refill  . amLODipine (NORVASC) 10 MG  tablet TAKE 1 TABLET BY MOUTH ONCE DAILY 90 tablet 1  . aspirin EC 81 MG tablet Take 1 tablet (81 mg total) by mouth daily. 150 tablet 2  . calcium carbonate (TUMS - DOSED IN MG ELEMENTAL CALCIUM) 500 MG chewable tablet Chew 1 tablet by mouth 2 (two) times daily. For Bone Health    . chlorthalidone (HYGROTON) 50 MG tablet TAKE 1 TABLET BY MOUTH ONCE DAILY 90 tablet 1  . cholecalciferol (VITAMIN D) 400 units TABS tablet Take 400 Units by mouth 2 (two) times daily.    . clopidogrel (PLAVIX) 75 MG tablet TAKE 1 TABLET BY MOUTH ONCE DAILY 30 tablet 1  . Insulin Isophane & Regular Human (NOVOLIN 70/30 FLEXPEN RELION) (70-30) 100 UNIT/ML PEN Inject 10 Units into the skin 2 (two) times daily before a meal. 5 pen 2  . levETIRAcetam (KEPPRA) 250 MG tablet Take 250 mg by mouth at bedtime.     Marland Kitchen levothyroxine (SYNTHROID, LEVOTHROID) 112 MCG tablet Take 1 tablet (112 mcg total) by mouth every morning. 30 tablet 0  . Multiple Vitamins-Minerals (EYE VITAMINS PO) Take 1 tablet by mouth daily.     . Multiple Vitamins-Minerals (MULTIVITAMIN WITH MINERALS) tablet Take 1 tablet by mouth daily.    Marland Kitchen olopatadine (PATANOL) 0.1 % ophthalmic solution Place 1 drop into both eyes 2 (two) times daily. 5 mL 12  . tamsulosin (FLOMAX) 0.4 MG CAPS capsule Take 0.4 mg by mouth daily.     . Vitamin D, Ergocalciferol, (DRISDOL) 50000 units CAPS capsule TAKE 1 CAPSULE BY MOUTH ONCE A WEEK 12 capsule 1  . glucose blood (ONE TOUCH ULTRA TEST) test strip USE 1 STRIP TO CHECK GLUCOSE UP TO TWICE DAILY E11.65 100 each 5  . ONE TOUCH ULTRA TEST test strip USE 1 STRIP TO CHECK GLUCOSE TWICE DAILY 100 each 5  . ONETOUCH DELICA LANCETS 97L MISC       Results for orders placed or performed during the hospital encounter of 05/27/18 (from the past 48 hour(s))  Comprehensive metabolic panel     Status: Abnormal   Collection Time: 05/27/18 10:16 AM  Result Value Ref Range   Sodium 141 135 - 145 mmol/L   Potassium 4.4 3.5 - 5.1 mmol/L    Chloride 109 101 - 111 mmol/L   CO2 23 22 - 32 mmol/L   Glucose, Bld 143 (H) 65 - 99 mg/dL   BUN 70 (H) 6 - 20 mg/dL   Creatinine, Ser 3.48 (H) 0.61 - 1.24 mg/dL   Calcium 9.2 8.9 - 10.3 mg/dL   Total Protein 7.6 6.5 - 8.1 g/dL   Albumin 3.7 3.5 - 5.0 g/dL   AST 23 15 - 41 U/L   ALT 20 17 - 63 U/L   Alkaline Phosphatase 118 38 - 126 U/L   Total Bilirubin 0.6 0.3 - 1.2 mg/dL   GFR calc non Af Amer 15 (L) >60 mL/min   GFR calc Af Amer 17 (L) >60 mL/min    Comment: (NOTE) The eGFR has been  calculated using the CKD EPI equation. This calculation has not been validated in all clinical situations. eGFR's persistently <60 mL/min signify possible Chronic Kidney Disease.    Anion gap 9 5 - 15    Comment: Performed at [facility], [address], [place], [place] [address]   No results found.  ROS  There were no vitals taken for this visit. Physical Exam  Constitutional: He appears well-developed and well-nourished.  HENT:  Mouth/Throat: Oropharynx is clear and moist.  He has thickening to both eyelids with increased pigmentation and fine scales.  Eyes: Conjunctivae are normal. No scleral icterus.  Neck: No thyromegaly present.  Cardiovascular: Normal rate and normal heart sounds.  No murmur heard. Respiratory: Effort normal and breath sounds normal.  GI: Soft. He exhibits no distension and no mass. There is no tenderness.  Musculoskeletal: He exhibits no edema.  Lymphadenopathy:    He has no cervical adenopathy.  Neurological: He is alert.  Skin: Skin is warm and dry.     Assessment/Plan Biliary obstruction secondary to Mirizzi syndrome or stone in the cystic duct. ERCP with removal of plastic stent spyglass examination of bile duct with removal of stone if possible otherwise will place Wallstent if patient's wife would agree.  She is not at the facility at present.  Chronic kidney disease.  Patient's creatinine is much higher than it has been in the past.  He is not  hyperkalemic.  He is on diuretic. He does not appear to be acutely ill.  Will obtain renal ultrasound prior to discharge. Will arrange for office visit with Dr. Moshe Cipro near future.  Hildred Laser, MD 05/27/2018, 11:40 AM

## 2018-05-27 NOTE — Op Note (Signed)
NAME: Chase [name], Chase [name] MEDICAL RECORD HU:[id number] ACCOUNT [id number] DATE OF BIRTH:1931/08/16 FACILITY: [facility] LOCATION: [facility] PHYSICIAN:Tonantzin Mimnaugh Gloriann Loan, MD  OPERATIVE REPORT  DATE OF PROCEDURE:  05/27/2018  PROCEDURES:  Endoscopic retrograde cholangiopancreatography with stent exchange, extension of biliary sphincterotomy, SpyGlass examination and mechanical lithotripsy.  SURGEON:  Hildred Laser, MD  INDICATIONS:  The patient is an 84 year old African-American male with multiple medical problems who presented about 3 months ago with abdominal pain, elevated transaminases and biliary fistula drainage.  He underwent ERCP on 03/02/2018 and was found to have large stone in the bile duct, felt to be extending into the cystic duct or outside the bile duct, i.e. Mirizzi syndrome.  Biliary stent was placed.  The patient's transaminases returned to normal.  Biliary fistula has healed.  He is now returning for repeat evaluation and therapy as appropriate.  I told the patient's family members that, I may consider putting a Wallstent if duct cannot be cleared of all stones. History and physical was performed. Lab studies are pertinent for elevated creatinine above his baseline. ASA score 4.  Procedure and risks were reviewed with the patient and his 2 daughters as well as his wife and informed consent was obtained.  ANESTHESIA:  Medications:  The patient was given general anesthesia.  See anesthesia records for details.  DESCRIPTION OF PROCEDURE:   Findings:  Procedure performed in the OR.  The patient was given anesthesia and intubated.  He was placed in semi-prone position.  Therapeutic video duodenoscope was passed via the oropharynx into the esophagus and stomach and  into the bulb and descending duodenum.  The stent was in place.  It was caught with snare and removed.  Stent was examined and was completely occluded.  A duodenoscope was passed again.  Bile duct was selectively cannulated with an  Autotome with a Hydra  Jagwire.  Biliary system was mildly dilated with filling defects.  Dormia basket was passed in the bile duct and the stone was caught and crushed.  Fragments were removed.  Sphincterotomy was extended and SpyGlass examination was performed.  Multiple  black stones were noted in the common bile duct.  Spyglass catheter was removed and the stones were caught and crushed with Dormia basket and multiple fragments were removed.  SpyGlass examination performed again.  There were still some fragments in the  bile duct.  Cholangiogram was obtained using a balloon catheter.  There was still a defect in the mid section of the bile duct and I was concerned this was possibly extrinsic.  Therefore, I decided to leave the stent in.  A 10 French, 7 cm long plastic  stent was placed using 1 step microinvasive system.  Once the stent was deployed, there was free flow of bile and contrast into the duodenum.  The pancreatic duct was not filled or cannulated.  The patient tolerated the procedure well.  He is in the process of being extubated.  Complications: No immediate complications.  No bleeding.   FINAL DIAGNOSES:   1.  Occluded biliary stent removed. 2.  Biliary sphincterotomy extended. 3.  Spyglass examination revealed multiple pigment stones in the bile duct.  Mechanical lithotripsy performed and multiple fragments removed.   4.  Filling defect in the mid portion of the bile duct suggestive of extrinsic impression, i.e. Mirizzi syndrome. 5.  Therefore, a 10-French 7 cm long plastic stent was placed for biliary decompression.  RECOMMENDATIONS:   1.  We will obtain renal ultrasound today to make sure he does not  have obstructive uropathy or hydronephrosis. 2.  If he does well, he will be going home today. 3.  We will arrange him to see Dr. Moshe Cipro next week regarding worsening renal function.  Please note patient asked not to take Hygro[place]. 4.  We will plan to see him in the office in  12 weeks.  AN/NUANCE  D:05/27/2018 T:05/27/2018 JOB:[id number]

## 2018-05-27 NOTE — Anesthesia Postprocedure Evaluation (Addendum)
Anesthesia Post Note Late Entry for 1425  Patient: Chase [name]  Procedure(s) Performed: ENDOSCOPIC RETROGRADE CHOLANGIOPANCREATOGRAPHY (ERCP) (N/A Esophagus) SPYGLASS CHOLANGIOSCOPY (N/A Esophagus) Biliary STENT REMOVAL (N/A Esophagus) SPHINCTEROTOMY extended (N/A Esophagus) MECHANICAL LITHOTRIPSY (N/A Esophagus) BILIARY STENT PLACEMENT (N/A Esophagus)  Patient location during evaluation: PACU Anesthesia Type: General Level of consciousness: awake and alert and oriented Pain management: pain level controlled Vital Signs Assessment: post-procedure vital signs reviewed and stable Respiratory status: spontaneous breathing Cardiovascular status: stable Postop Assessment: no apparent nausea or vomiting Anesthetic complications: no  Fentanyl 2cc wasted by Martina Sinner and witnessed by K Kasmira Cacioppo,CRNA.   Last Vitals:  Vitals:   05/27/18 1415 05/27/18 1430  BP: (!) 139/104 (!) 139/94  Pulse: 77 77  Resp: 14 20  Temp:  36.8 C  SpO2: 98% 98%    Last Pain:  Vitals:   05/27/18 1430  TempSrc: Oral  PainSc: 0-No pain                 ADAMS, AMY A

## 2018-05-27 NOTE — Discharge Instructions (Signed)
Clear liquids today.  Resume usual diet starting in a.m. Resume aspirin on 05/30/2018 .  Resume other medications as before. Do not take chlorthalidone anymore.   Follow-up with Dr. Moshe Cipro next week regarding chronic kidney disease.   Endoscopic Retrograde Cholangiopancreatogram, Care After This sheet gives you information about how to care for yourself after your procedure. Your health care provider may also give you more specific instructions. If you have problems or questions, contact your health care provider. What can I expect after the procedure? After the procedure, it is common to have:  Soreness in your throat.  Nausea.  Bloating.  Dizziness.  Tiredness (fatigue).  Follow these instructions at home:  Take over-the-counter and prescription medicines only as told by your health care provider.  Do not drive for 24 hours if you were given a medicine to help you relax (sedative) during your procedure. Have someone stay with you for 24 hours after the procedure.  Return to your normal activities as told by your health care provider. Ask your health care provider what activities are safe for you.  Return to eating what you normally do as soon as you feel well enough or as told by your health care provider.  Keep all follow-up visits as told by your health care provider. This is important. Contact a health care provider if:  You have pain in your abdomen that does not get better with medicine.  You develop signs of infection, such as: ? Chills. ? Feeling unwell. Get help right away if:  You have difficulty swallowing.  You have worsening pain in your throat, chest, or abdomen.  You vomit bright red blood or a substance that looks like coffee grounds.  You have bloody or very black stools.  You have a fever.  You have a sudden increase in swelling (bloating) in your abdomen. Summary  After the procedure, it is common to feel tired and to have some discomfort in  your throat.  Contact your health care provider if you have signs of infection--such as chills or feeling unwell--or if you have pain that does not improve with medicine.  Get help right away if you have trouble swallowing, worsening pain, bloody or black vomit, bloody or black stools, a fever, or increased swelling in your abdomen.  Keep all follow-up visits as told by your health care provider. This is important. This information is not intended to replace advice given to you by your health care provider. Make sure you discuss any questions you have with your health care provider. Document Released: 09/13/2013 Document Revised: 10/12/2016 Document Reviewed: 10/12/2016 Elsevier Interactive Patient Education  2017 [employer] PATIENT INSTRUCTIONS POST-ANESTHESIA  IMMEDIATELY FOLLOWING SURGERY:  Do not drive or operate machinery for the first twenty four hours after surgery.  Do not make any important decisions for twenty four hours after surgery or while taking narcotic pain medications or sedatives.  If you develop intractable nausea and vomiting or a severe headache please notify your doctor immediately.  FOLLOW-UP:  Please make an appointment with your surgeon as instructed. You do not need to follow up with anesthesia unless specifically instructed to do so.  WOUND CARE INSTRUCTIONS (if applicable):  Keep a dry clean dressing on the anesthesia/puncture wound site if there is drainage.  Once the wound has quit draining you may leave it open to air.  Generally you should leave the bandage intact for twenty four hours unless there is drainage.  If the epidural site drains for more than  36-48 hours please call the anesthesia department.  QUESTIONS?:  Please feel free to call your physician or the hospital operator if you have any questions, and they will be happy to assist you.

## 2018-05-27 NOTE — Transfer of Care (Addendum)
Immediate Anesthesia Transfer of Care Note  Patient: Chase [name]  Procedure(s) Performed: ENDOSCOPIC RETROGRADE CHOLANGIOPANCREATOGRAPHY (ERCP) (N/A Esophagus) SPYGLASS CHOLANGIOSCOPY (N/A Esophagus) Biliary STENT REMOVAL (N/A Esophagus) SPHINCTEROTOMY extended (N/A Esophagus) MECHANICAL LITHOTRIPSY (N/A Esophagus) BILIARY STENT PLACEMENT (N/A Esophagus)  Patient Location: PACU  Anesthesia Type:General  Level of Consciousness: awake, alert , oriented and patient cooperative  Airway & Oxygen Therapy: Patient Spontanous Breathing  Post-op Assessment: Report given to RN and Post -op Vital signs reviewed and stable  Post vital signs: Reviewed and stable  Last Vitals:  Vitals Value Taken Time  BP    Temp 36.8 C 05/27/2018  2:03 PM  Pulse 76 05/27/2018  2:12 PM  Resp 16 05/27/2018  2:12 PM  SpO2 100 % 05/27/2018  2:12 PM  Vitals shown include unvalidated device data.  Last Pain: There were no vitals filed for this visit.       Complications: No apparent anesthesia complications   2/80/03  1215  Fentanyl 2cc wasted by Hewitt Shorts, and witness by me, Garen Lah, CRNA.

## 2018-05-27 NOTE — Progress Notes (Signed)
Brief ERCP note.  Biliary stent removed.  It was completely occluded. Filling defects and mildly dilated common bile duct and common hepatic duct. Biliary sphincterotomy extended. Mechanical lithotripsy performed on multiple black stone fragments removed. Spyglass examination performed before and after removing the stones and duct was still not clear. Filling defect in the midportion of the duct suggestive of extrinsic compression from stone in the bile duct. Therefore 10 French 7 cm long plastic stent placed for biliary decompression. Patient tolerated the procedure well.

## 2018-05-27 NOTE — Anesthesia Preprocedure Evaluation (Signed)
Anesthesia Evaluation  Patient identified by MRN, date of birth, ID band Patient awake    Reviewed: Allergy & Precautions, NPO status , Patient's Chart, lab work & pertinent test results  Airway Mallampati: III  TM Distance: >3 FB Neck ROM: Full    Dental no notable dental hx. (+) Poor Dentition   Pulmonary neg pulmonary ROS,    Pulmonary exam normal breath sounds clear to auscultation       Cardiovascular Exercise Tolerance: Poor hypertension, Pt. on medications + Peripheral Vascular Disease  negative cardio ROS Normal cardiovascular exam+ dysrhythmias II Rhythm:Regular Rate:Normal     Neuro/Psych Seizures -, Well Controlled,  PSYCHIATRIC DISORDERS Depression CVA, Residual Symptoms negative neurological ROS  negative psych ROS   GI/Hepatic negative GI ROS, Neg liver ROS,   Endo/Other  negative endocrine ROSdiabetes, Well Controlled, Type 1Hypothyroidism   Renal/GU Renal InsufficiencyRenal diseasenegative Renal ROS  negative genitourinary   Musculoskeletal negative musculoskeletal ROS (+) Arthritis ,   Abdominal   Peds negative pediatric ROS (+)  Hematology negative hematology ROS (+) anemia ,   Anesthesia Other Findings   Reproductive/Obstetrics negative OB ROS                             Anesthesia Physical Anesthesia Plan  ASA: IV  Anesthesia Plan: General   Post-op Pain Management:    Induction: Intravenous  PONV Risk Score and Plan:   Airway Management Planned: Oral ETT  Additional Equipment:   Intra-op Plan:   Post-operative Plan: Extubation in OR  Informed Consent: I have reviewed the patients History and Physical, chart, labs and discussed the procedure including the risks, benefits and alternatives for the proposed anesthesia with the patient or authorized representative who has indicated his/her understanding and acceptance.   Dental advisory given  Plan  Discussed with: CRNA  Anesthesia Plan Comments:         Anesthesia Quick Evaluation

## 2018-05-27 NOTE — Telephone Encounter (Signed)
I agree with the verbal order for nurse visit to help with medication mangement

## 2018-05-28 DIAGNOSIS — D631 Anemia in chronic kidney disease: Secondary | ICD-10-CM | POA: Diagnosis not present

## 2018-05-28 DIAGNOSIS — G40209 Localization-related (focal) (partial) symptomatic epilepsy and epileptic syndromes with complex partial seizures, not intractable, without status epilepticus: Secondary | ICD-10-CM | POA: Diagnosis not present

## 2018-05-28 DIAGNOSIS — E1165 Type 2 diabetes mellitus with hyperglycemia: Secondary | ICD-10-CM | POA: Diagnosis not present

## 2018-05-28 DIAGNOSIS — E1122 Type 2 diabetes mellitus with diabetic chronic kidney disease: Secondary | ICD-10-CM | POA: Diagnosis not present

## 2018-05-28 DIAGNOSIS — N184 Chronic kidney disease, stage 4 (severe): Secondary | ICD-10-CM | POA: Diagnosis not present

## 2018-05-28 DIAGNOSIS — I129 Hypertensive chronic kidney disease with stage 1 through stage 4 chronic kidney disease, or unspecified chronic kidney disease: Secondary | ICD-10-CM | POA: Diagnosis not present

## 2018-05-30 ENCOUNTER — Telehealth: Payer: Self-pay

## 2018-05-30 ENCOUNTER — Telehealth: Payer: Self-pay | Admitting: Family Medicine

## 2018-05-30 DIAGNOSIS — N184 Chronic kidney disease, stage 4 (severe): Secondary | ICD-10-CM

## 2018-05-30 NOTE — Addendum Note (Signed)
Addendum  created 05/30/18 1215 by Ollen Bowl, CRNA   Sign clinical note

## 2018-05-30 NOTE — Addendum Note (Signed)
Addendum  created 05/30/18 1211 by Ollen Bowl, CRNA   Intraprocedure Event edited

## 2018-05-30 NOTE — Telephone Encounter (Signed)
Andree Elk, Nurse with [facility] called and she said the patient has a 50,000 IU bottle weekly of Vit D as well as a 400 daily.  What should he be taking?  Needs order confirmed.

## 2018-05-30 NOTE — Telephone Encounter (Signed)
Dr Laural Golden is out of the office but his assistant called to report that he seen Chase [name] Friday and his preop creatinine was 3.5. Back in March it was 2.7. He discontinued his Hygroten an a renal US was done- no hydronephrosis seen. Wanted you to be aware incase you wanted to see him to recheck labs and evaluate his kidney function.

## 2018-05-31 DIAGNOSIS — E1165 Type 2 diabetes mellitus with hyperglycemia: Secondary | ICD-10-CM | POA: Diagnosis not present

## 2018-05-31 DIAGNOSIS — G40209 Localization-related (focal) (partial) symptomatic epilepsy and epileptic syndromes with complex partial seizures, not intractable, without status epilepticus: Secondary | ICD-10-CM | POA: Diagnosis not present

## 2018-05-31 DIAGNOSIS — I129 Hypertensive chronic kidney disease with stage 1 through stage 4 chronic kidney disease, or unspecified chronic kidney disease: Secondary | ICD-10-CM | POA: Diagnosis not present

## 2018-05-31 DIAGNOSIS — D631 Anemia in chronic kidney disease: Secondary | ICD-10-CM | POA: Diagnosis not present

## 2018-05-31 DIAGNOSIS — E1122 Type 2 diabetes mellitus with diabetic chronic kidney disease: Secondary | ICD-10-CM | POA: Diagnosis not present

## 2018-05-31 DIAGNOSIS — N184 Chronic kidney disease, stage 4 (severe): Secondary | ICD-10-CM | POA: Diagnosis not present

## 2018-05-31 NOTE — Telephone Encounter (Signed)
Should he be on both daily?

## 2018-05-31 NOTE — Telephone Encounter (Signed)
Chase [name] aware

## 2018-05-31 NOTE — Telephone Encounter (Signed)
Only once weekly vitamin D

## 2018-06-01 ENCOUNTER — Encounter (HOSPITAL_COMMUNITY): Payer: Self-pay | Admitting: Internal Medicine

## 2018-06-01 NOTE — Telephone Encounter (Signed)
pls order non fast chem 7 and EGFR to be drawn  As stat in the morning, pt's wife is awarea nd she will get him to the lab before mid day

## 2018-06-02 DIAGNOSIS — N184 Chronic kidney disease, stage 4 (severe): Secondary | ICD-10-CM | POA: Diagnosis not present

## 2018-06-02 DIAGNOSIS — I129 Hypertensive chronic kidney disease with stage 1 through stage 4 chronic kidney disease, or unspecified chronic kidney disease: Secondary | ICD-10-CM | POA: Diagnosis not present

## 2018-06-02 DIAGNOSIS — E1165 Type 2 diabetes mellitus with hyperglycemia: Secondary | ICD-10-CM | POA: Diagnosis not present

## 2018-06-02 DIAGNOSIS — D631 Anemia in chronic kidney disease: Secondary | ICD-10-CM | POA: Diagnosis not present

## 2018-06-02 DIAGNOSIS — G40209 Localization-related (focal) (partial) symptomatic epilepsy and epileptic syndromes with complex partial seizures, not intractable, without status epilepticus: Secondary | ICD-10-CM | POA: Diagnosis not present

## 2018-06-02 DIAGNOSIS — E1122 Type 2 diabetes mellitus with diabetic chronic kidney disease: Secondary | ICD-10-CM | POA: Diagnosis not present

## 2018-06-02 DIAGNOSIS — Z794 Long term (current) use of insulin: Secondary | ICD-10-CM | POA: Diagnosis not present

## 2018-06-02 DIAGNOSIS — E038 Other specified hypothyroidism: Secondary | ICD-10-CM | POA: Diagnosis not present

## 2018-06-02 DIAGNOSIS — E782 Mixed hyperlipidemia: Secondary | ICD-10-CM | POA: Diagnosis not present

## 2018-06-02 NOTE — Telephone Encounter (Signed)
Ordered stat

## 2018-06-03 LAB — COMPLETE METABOLIC PANEL WITH GFR
AG Ratio: 1.2 (calc) (ref 1.0–2.5)
ALT: 43 U/L (ref 9–46)
AST: 30 U/L (ref 10–35)
Albumin: 3.9 g/dL (ref 3.6–5.1)
Alkaline phosphatase (APISO): 167 U/L — ABNORMAL HIGH (ref 40–115)
BUN/Creatinine Ratio: 15 (calc) (ref 6–22)
BUN: 52 mg/dL — ABNORMAL HIGH (ref 7–25)
CO2: 22 mmol/L (ref 20–32)
Calcium: 9.3 mg/dL (ref 8.6–10.3)
Chloride: 104 mmol/L (ref 98–110)
Creat: 3.56 mg/dL — ABNORMAL HIGH (ref 0.70–1.11)
GFR, Est African American: 17 mL/min/{1.73_m2} — ABNORMAL LOW (ref >=60)
GFR, Est Non African American: 15 mL/min/{1.73_m2} — ABNORMAL LOW (ref >=60)
Globulin: 3.3 g/dL (calc) (ref 1.9–3.7)
Glucose, Bld: 163 mg/dL — ABNORMAL HIGH (ref 65–139)
Potassium: 4.3 mmol/L (ref 3.5–5.3)
Sodium: 137 mmol/L (ref 135–146)
Total Bilirubin: 0.5 mg/dL (ref 0.2–1.2)
Total Protein: 7.2 g/dL (ref 6.1–8.1)

## 2018-06-03 LAB — LIPID PANEL
Cholesterol: 212 mg/dL — ABNORMAL HIGH (ref <200)
HDL: 67 mg/dL (ref >40)
LDL Cholesterol (Calc): 126 mg/dL (calc) — ABNORMAL HIGH
Non-HDL Cholesterol (Calc): 145 mg/dL (calc) — ABNORMAL HIGH (ref <130)
Total CHOL/HDL Ratio: 3.2 (calc) (ref <5.0)
Triglycerides: 93 mg/dL (ref <150)

## 2018-06-03 LAB — T4, FREE: Free T4: 1.4 ng/dL (ref 0.8–1.8)

## 2018-06-03 LAB — HEMOGLOBIN A1C
Hgb A1c MFr Bld: 7.5 % of total Hgb — ABNORMAL HIGH (ref <5.7)
Mean Plasma Glucose: 169 (calc)
eAG (mmol/L): 9.3 (calc)

## 2018-06-03 LAB — TSH: TSH: 0.82 mIU/L (ref 0.40–4.50)

## 2018-06-06 DIAGNOSIS — N184 Chronic kidney disease, stage 4 (severe): Secondary | ICD-10-CM | POA: Diagnosis not present

## 2018-06-06 DIAGNOSIS — G40209 Localization-related (focal) (partial) symptomatic epilepsy and epileptic syndromes with complex partial seizures, not intractable, without status epilepticus: Secondary | ICD-10-CM | POA: Diagnosis not present

## 2018-06-06 DIAGNOSIS — E1122 Type 2 diabetes mellitus with diabetic chronic kidney disease: Secondary | ICD-10-CM | POA: Diagnosis not present

## 2018-06-06 DIAGNOSIS — I129 Hypertensive chronic kidney disease with stage 1 through stage 4 chronic kidney disease, or unspecified chronic kidney disease: Secondary | ICD-10-CM | POA: Diagnosis not present

## 2018-06-06 DIAGNOSIS — D631 Anemia in chronic kidney disease: Secondary | ICD-10-CM | POA: Diagnosis not present

## 2018-06-06 DIAGNOSIS — E1165 Type 2 diabetes mellitus with hyperglycemia: Secondary | ICD-10-CM | POA: Diagnosis not present

## 2018-06-07 ENCOUNTER — Encounter: Payer: Self-pay | Admitting: Family Medicine

## 2018-06-07 ENCOUNTER — Ambulatory Visit (INDEPENDENT_AMBULATORY_CARE_PROVIDER_SITE_OTHER): Payer: Medicare Other | Admitting: Family Medicine

## 2018-06-07 VITALS — BP 138/72 | HR 69 | Resp 14 | Ht 68.0 in | Wt 176.2 lb

## 2018-06-07 DIAGNOSIS — N184 Chronic kidney disease, stage 4 (severe): Secondary | ICD-10-CM | POA: Diagnosis not present

## 2018-06-07 DIAGNOSIS — E1021 Type 1 diabetes mellitus with diabetic nephropathy: Secondary | ICD-10-CM

## 2018-06-07 DIAGNOSIS — E782 Mixed hyperlipidemia: Secondary | ICD-10-CM

## 2018-06-07 DIAGNOSIS — D631 Anemia in chronic kidney disease: Secondary | ICD-10-CM | POA: Diagnosis not present

## 2018-06-07 DIAGNOSIS — E1165 Type 2 diabetes mellitus with hyperglycemia: Secondary | ICD-10-CM | POA: Diagnosis not present

## 2018-06-07 DIAGNOSIS — G40209 Localization-related (focal) (partial) symptomatic epilepsy and epileptic syndromes with complex partial seizures, not intractable, without status epilepticus: Secondary | ICD-10-CM | POA: Diagnosis not present

## 2018-06-07 DIAGNOSIS — I150 Renovascular hypertension: Secondary | ICD-10-CM | POA: Diagnosis not present

## 2018-06-07 DIAGNOSIS — M4716 Other spondylosis with myelopathy, lumbar region: Secondary | ICD-10-CM

## 2018-06-07 DIAGNOSIS — I129 Hypertensive chronic kidney disease with stage 1 through stage 4 chronic kidney disease, or unspecified chronic kidney disease: Secondary | ICD-10-CM | POA: Diagnosis not present

## 2018-06-07 DIAGNOSIS — E1122 Type 2 diabetes mellitus with diabetic chronic kidney disease: Secondary | ICD-10-CM | POA: Diagnosis not present

## 2018-06-07 NOTE — Assessment & Plan Note (Addendum)
NEEDS FOLLOW UP WITH NEPHROLOGY, clinically stable, missed recent appointment with cardiology due to hospitalization , family t reschedule, will assist if needed

## 2018-06-07 NOTE — Patient Instructions (Addendum)
Wellness with nurse October 19 or after flu vaccine at that visit  Please cancel appt with me in August  I will refer you to Dr Birdie Sons, you may call for the appt, or speak with referral STAFF  PLEASE MAKE EVERY ATTEMPT TO [place]

## 2018-06-08 DIAGNOSIS — E1122 Type 2 diabetes mellitus with diabetic chronic kidney disease: Secondary | ICD-10-CM | POA: Diagnosis not present

## 2018-06-08 DIAGNOSIS — D631 Anemia in chronic kidney disease: Secondary | ICD-10-CM | POA: Diagnosis not present

## 2018-06-08 DIAGNOSIS — I129 Hypertensive chronic kidney disease with stage 1 through stage 4 chronic kidney disease, or unspecified chronic kidney disease: Secondary | ICD-10-CM | POA: Diagnosis not present

## 2018-06-08 DIAGNOSIS — G40209 Localization-related (focal) (partial) symptomatic epilepsy and epileptic syndromes with complex partial seizures, not intractable, without status epilepticus: Secondary | ICD-10-CM | POA: Diagnosis not present

## 2018-06-08 DIAGNOSIS — N184 Chronic kidney disease, stage 4 (severe): Secondary | ICD-10-CM | POA: Diagnosis not present

## 2018-06-08 DIAGNOSIS — E1165 Type 2 diabetes mellitus with hyperglycemia: Secondary | ICD-10-CM | POA: Diagnosis not present

## 2018-06-09 DIAGNOSIS — E1165 Type 2 diabetes mellitus with hyperglycemia: Secondary | ICD-10-CM | POA: Diagnosis not present

## 2018-06-09 DIAGNOSIS — I129 Hypertensive chronic kidney disease with stage 1 through stage 4 chronic kidney disease, or unspecified chronic kidney disease: Secondary | ICD-10-CM | POA: Diagnosis not present

## 2018-06-09 DIAGNOSIS — D631 Anemia in chronic kidney disease: Secondary | ICD-10-CM | POA: Diagnosis not present

## 2018-06-09 DIAGNOSIS — E1122 Type 2 diabetes mellitus with diabetic chronic kidney disease: Secondary | ICD-10-CM | POA: Diagnosis not present

## 2018-06-09 DIAGNOSIS — N184 Chronic kidney disease, stage 4 (severe): Secondary | ICD-10-CM | POA: Diagnosis not present

## 2018-06-09 DIAGNOSIS — G40209 Localization-related (focal) (partial) symptomatic epilepsy and epileptic syndromes with complex partial seizures, not intractable, without status epilepticus: Secondary | ICD-10-CM | POA: Diagnosis not present

## 2018-06-10 DIAGNOSIS — D631 Anemia in chronic kidney disease: Secondary | ICD-10-CM | POA: Diagnosis not present

## 2018-06-10 DIAGNOSIS — E1122 Type 2 diabetes mellitus with diabetic chronic kidney disease: Secondary | ICD-10-CM | POA: Diagnosis not present

## 2018-06-10 DIAGNOSIS — E1165 Type 2 diabetes mellitus with hyperglycemia: Secondary | ICD-10-CM | POA: Diagnosis not present

## 2018-06-10 DIAGNOSIS — G40209 Localization-related (focal) (partial) symptomatic epilepsy and epileptic syndromes with complex partial seizures, not intractable, without status epilepticus: Secondary | ICD-10-CM | POA: Diagnosis not present

## 2018-06-10 DIAGNOSIS — N184 Chronic kidney disease, stage 4 (severe): Secondary | ICD-10-CM | POA: Diagnosis not present

## 2018-06-10 DIAGNOSIS — I129 Hypertensive chronic kidney disease with stage 1 through stage 4 chronic kidney disease, or unspecified chronic kidney disease: Secondary | ICD-10-CM | POA: Diagnosis not present

## 2018-06-11 ENCOUNTER — Encounter: Payer: Self-pay | Admitting: Family Medicine

## 2018-06-11 ENCOUNTER — Telehealth: Payer: Self-pay | Admitting: Family Medicine

## 2018-06-11 NOTE — Progress Notes (Signed)
Chase [name]     MRN: [id number]      DOB: [birth year]   HPI Chase [name] is here for follow up and re-evaluation . He was hospitalized  for choledocholithiasis from 3/22 to 03/03/2018. He had ERCP with stent placement, following hospitalization,he was transferred to a SNF where he spent approximately 2 months in the hope that he would get strengthening prior to returning home, he has been homme in June. Daughter accompanies her Mother to visit , they both say that patient spends more time than he should just sitting around rather than moving. There is no h/o fall, his appetite is good and his blood sugars are within range. He has had no fever or chills and there is no complaint  Voiced re excess drainage  Family wishes to d/c neurology follow up as he is stable on his seizure medication and continue with endocrine and GI ROS See HPI  Denies recent fever or chills. Denies sinus pressure, nasal congestion, ear pain or sore throat. Denies chest congestion, productive cough or wheezing. Denies chest pains, palpitations and leg swelling Denies abdominal pain, nausea, vomiting,diarrhea or constipation.   Denies dysuria, frequency, hesitancy or incontinence. C/o chronic joint stiffness  and limitation in mobility. Denies headaches, seizures, numbness, or tingling. Denies depression, anxiety or insomnia. Denies skin break down or rash.   PE  BP 138/72 (BP Location: Left Arm, Patient Position: Sitting, Cuff Size: Normal)   Pulse 69   Resp 14   Ht 5\' 8"  (1.727 m)   Wt 176 lb 4 oz (79.9 kg)   SpO2 98%   BMI 26.80 kg/m   Patient alert and oriented and in no cardiopulmonary distress.  HEENT: No facial asymmetry, EOMI,   oropharynx pink and moist.  Neck decreased ROM no JVD, no mass.  Chest: Clear to auscultation bilaterally.  CVS: S1, S2 no murmurs, no S3.Regular rate.  ABD: Soft non tender.   Ext: No edema  MS: Decreased  ROM spine, shoulders, hips and knees.  Skin: Intact, no  ulcerations or rash noted.  Psych: Good eye contact, normal affect. Memory impaired not anxious or depressed appearing.  CNS: CN 2-12 intact, power, grade 4 power on right upper and lower extremities  Assessment & Plan  CKD (chronic kidney disease) stage 4, GFR 15-29 ml/min (HCC) NEEDS FOLLOW UP WITH NEPHROLOGY, clinically stable, missed recent appointment with cardiology due to hospitalization , family t reschedule, will assist if needed  Renovascular hypertension Controlled, no change in medication Patient encouraged to increase activity and maintain low sodium diet  Type 1 diabetes mellitus with diabetic nephropathy ([place]) Controlled and managed by endocrinology Chase [name] is reminded of the importance of commitment to daily physical activity for 30 minutes or more, as able and the need to limit carbohydrate intake to 30 to 60 grams per meal to help with blood sugar control.   The need to take medication as prescribed, test blood sugar as directed, and to call between visits if there is a concern that blood sugar is uncontrolled is also discussed.   Chase [name] is reminded of the importance of daily foot exam, annual eye examination, and good blood sugar, blood pressure and cholesterol control.  Diabetic Labs Latest Ref Rng & Units 06/02/2018 05/27/2018 03/03/2018 03/01/2018 02/28/2018  HbA1c <5.7 % of total Hgb 7.5(H) - - - -  Microalbumin Not Estab. ug/mL - - - - -  Micro/Creat Ratio 0.0 - 30.0 mg/g creat - - - - -  Chol <  200 mg/dL 212(H) - - - -  HDL >40 mg/dL 67 - - - -  Calc LDL mg/dL (calc) 126(H) - - - -  Triglycerides <150 mg/dL 93 - - - -  Creatinine 0.70 - 1.11 mg/dL 3.56(H) 3.48(H) 2.74(H) 2.72(H) 2.67(H)   BP/Weight 06/07/2018 05/27/2018 05/23/2018 03/03/2018 02/16/2018 02/09/2018 81/82/9937  Systolic BP 169 678 938 101 751 025 852  Diastolic BP 72 94 79 62 78 70 70  Wt. (Lbs) 176.25 - 167 167.55 - 173 175.08  BMI 26.8 - 25.39 25.48 26.3 26.3 26.62   Foot/eye exam completion  dates Latest Ref Rng & Units 02/25/2017 11/12/2015  Eye Exam No Retinopathy - -  Foot exam Order - - -  Foot Form Completion - Done Done        Mixed hyperlipidemia Hyperlipidemia:Low fat diet discussed and encouraged.   Lipid Panel  Lab Results  Component Value Date   CHOL 212 (H) 06/02/2018   HDL 67 06/02/2018   LDLCALC 126 (H) 06/02/2018   TRIG 93 06/02/2018   CHOLHDL 3.2 06/02/2018   Uncontrolled , needs to resume statin, will contact family Also needs to reduce fat intake , will need repeat fasting lipid and hepatic panel I 5 months    Osteoarthritis of spine Home safety and increased physical activity and home exercises to improve mobilty and wellbeing encouraged

## 2018-06-11 NOTE — Assessment & Plan Note (Signed)
Home safety and increased physical activity and home exercises to improve mobilty and wellbeing encouraged

## 2018-06-11 NOTE — Assessment & Plan Note (Signed)
Controlled and managed by endocrinology Chase [name] is reminded of the importance of commitment to daily physical activity for 30 minutes or more, as able and the need to limit carbohydrate intake to 30 to 60 grams per meal to help with blood sugar control.   The need to take medication as prescribed, test blood sugar as directed, and to call between visits if there is a concern that blood sugar is uncontrolled is also discussed.   Chase [name] is reminded of the importance of daily foot exam, annual eye examination, and good blood sugar, blood pressure and cholesterol control.  Diabetic Labs Latest Ref Rng & Units 06/02/2018 05/27/2018 03/03/2018 03/01/2018 02/28/2018  HbA1c <5.7 % of total Hgb 7.5(H) - - - -  Microalbumin Not Estab. ug/mL - - - - -  Micro/Creat Ratio 0.0 - 30.0 mg/g creat - - - - -  Chol <200 mg/dL 212(H) - - - -  HDL >40 mg/dL 67 - - - -  Calc LDL mg/dL (calc) 126(H) - - - -  Triglycerides <150 mg/dL 93 - - - -  Creatinine 0.70 - 1.11 mg/dL 3.56(H) 3.48(H) 2.74(H) 2.72(H) 2.67(H)   BP/Weight 06/07/2018 05/27/2018 05/23/2018 03/03/2018 02/16/2018 02/09/2018 05/39/7673  Systolic BP 419 379 024 097 353 299 242  Diastolic BP 72 94 79 62 78 70 70  Wt. (Lbs) 176.25 - 167 167.55 - 173 175.08  BMI 26.8 - 25.39 25.48 26.3 26.3 26.62   Foot/eye exam completion dates Latest Ref Rng & Units 02/25/2017 11/12/2015  Eye Exam No Retinopathy - -  Foot exam Order - - -  Foot Form Completion - Done Done

## 2018-06-11 NOTE — Assessment & Plan Note (Signed)
Controlled, no change in medication Patient encouraged to increase activity and maintain low sodium diet

## 2018-06-11 NOTE — Telephone Encounter (Signed)
Please contact patient's wife. He needs to resume medication for cholesterol. I entered pravastatin 20 mg historically, after you have spoke to her pls send in . I have also asked for a January f/u with me. He will need fasting lipid and hepatic end December , yo may mail the lab order to her also please

## 2018-06-11 NOTE — Assessment & Plan Note (Addendum)
Hyperlipidemia:Low fat diet discussed and encouraged.   Lipid Panel  Lab Results  Component Value Date   CHOL 212 (H) 06/02/2018   HDL 67 06/02/2018   LDLCALC 126 (H) 06/02/2018   TRIG 93 06/02/2018   CHOLHDL 3.2 06/02/2018   Uncontrolled , needs to resume statin, will contact family Also needs to reduce fat intake , will need repeat fasting lipid and hepatic panel I 5 months

## 2018-06-13 ENCOUNTER — Telehealth: Payer: Self-pay | Admitting: Family Medicine

## 2018-06-13 DIAGNOSIS — G40209 Localization-related (focal) (partial) symptomatic epilepsy and epileptic syndromes with complex partial seizures, not intractable, without status epilepticus: Secondary | ICD-10-CM | POA: Diagnosis not present

## 2018-06-13 DIAGNOSIS — D631 Anemia in chronic kidney disease: Secondary | ICD-10-CM | POA: Diagnosis not present

## 2018-06-13 DIAGNOSIS — E1165 Type 2 diabetes mellitus with hyperglycemia: Secondary | ICD-10-CM | POA: Diagnosis not present

## 2018-06-13 DIAGNOSIS — I129 Hypertensive chronic kidney disease with stage 1 through stage 4 chronic kidney disease, or unspecified chronic kidney disease: Secondary | ICD-10-CM | POA: Diagnosis not present

## 2018-06-13 DIAGNOSIS — E1122 Type 2 diabetes mellitus with diabetic chronic kidney disease: Secondary | ICD-10-CM | POA: Diagnosis not present

## 2018-06-13 DIAGNOSIS — N184 Chronic kidney disease, stage 4 (severe): Secondary | ICD-10-CM | POA: Diagnosis not present

## 2018-06-13 NOTE — Telephone Encounter (Signed)
Need verbal ok for OT 2 x @ week for 3 weeks

## 2018-06-14 DIAGNOSIS — E1122 Type 2 diabetes mellitus with diabetic chronic kidney disease: Secondary | ICD-10-CM | POA: Diagnosis not present

## 2018-06-14 DIAGNOSIS — N184 Chronic kidney disease, stage 4 (severe): Secondary | ICD-10-CM | POA: Diagnosis not present

## 2018-06-14 DIAGNOSIS — E1165 Type 2 diabetes mellitus with hyperglycemia: Secondary | ICD-10-CM | POA: Diagnosis not present

## 2018-06-14 DIAGNOSIS — I129 Hypertensive chronic kidney disease with stage 1 through stage 4 chronic kidney disease, or unspecified chronic kidney disease: Secondary | ICD-10-CM | POA: Diagnosis not present

## 2018-06-14 DIAGNOSIS — D631 Anemia in chronic kidney disease: Secondary | ICD-10-CM | POA: Diagnosis not present

## 2018-06-14 DIAGNOSIS — G40209 Localization-related (focal) (partial) symptomatic epilepsy and epileptic syndromes with complex partial seizures, not intractable, without status epilepticus: Secondary | ICD-10-CM | POA: Diagnosis not present

## 2018-06-14 NOTE — Telephone Encounter (Signed)
Verbal order given  

## 2018-06-16 DIAGNOSIS — G40209 Localization-related (focal) (partial) symptomatic epilepsy and epileptic syndromes with complex partial seizures, not intractable, without status epilepticus: Secondary | ICD-10-CM | POA: Diagnosis not present

## 2018-06-16 DIAGNOSIS — N184 Chronic kidney disease, stage 4 (severe): Secondary | ICD-10-CM | POA: Diagnosis not present

## 2018-06-16 DIAGNOSIS — D631 Anemia in chronic kidney disease: Secondary | ICD-10-CM | POA: Diagnosis not present

## 2018-06-16 DIAGNOSIS — Z794 Long term (current) use of insulin: Secondary | ICD-10-CM | POA: Diagnosis not present

## 2018-06-16 DIAGNOSIS — E1165 Type 2 diabetes mellitus with hyperglycemia: Secondary | ICD-10-CM | POA: Diagnosis not present

## 2018-06-16 DIAGNOSIS — I129 Hypertensive chronic kidney disease with stage 1 through stage 4 chronic kidney disease, or unspecified chronic kidney disease: Secondary | ICD-10-CM | POA: Diagnosis not present

## 2018-06-16 DIAGNOSIS — H547 Unspecified visual loss: Secondary | ICD-10-CM | POA: Diagnosis not present

## 2018-06-16 DIAGNOSIS — Z7982 Long term (current) use of aspirin: Secondary | ICD-10-CM | POA: Diagnosis not present

## 2018-06-16 DIAGNOSIS — E1122 Type 2 diabetes mellitus with diabetic chronic kidney disease: Secondary | ICD-10-CM | POA: Diagnosis not present

## 2018-06-16 NOTE — Telephone Encounter (Signed)
Called home number and was busy x 2

## 2018-06-20 DIAGNOSIS — N184 Chronic kidney disease, stage 4 (severe): Secondary | ICD-10-CM | POA: Diagnosis not present

## 2018-06-20 DIAGNOSIS — I129 Hypertensive chronic kidney disease with stage 1 through stage 4 chronic kidney disease, or unspecified chronic kidney disease: Secondary | ICD-10-CM | POA: Diagnosis not present

## 2018-06-20 DIAGNOSIS — D631 Anemia in chronic kidney disease: Secondary | ICD-10-CM | POA: Diagnosis not present

## 2018-06-20 DIAGNOSIS — G40209 Localization-related (focal) (partial) symptomatic epilepsy and epileptic syndromes with complex partial seizures, not intractable, without status epilepticus: Secondary | ICD-10-CM | POA: Diagnosis not present

## 2018-06-20 DIAGNOSIS — E1122 Type 2 diabetes mellitus with diabetic chronic kidney disease: Secondary | ICD-10-CM | POA: Diagnosis not present

## 2018-06-20 DIAGNOSIS — E1165 Type 2 diabetes mellitus with hyperglycemia: Secondary | ICD-10-CM | POA: Diagnosis not present

## 2018-06-21 DIAGNOSIS — E1122 Type 2 diabetes mellitus with diabetic chronic kidney disease: Secondary | ICD-10-CM | POA: Diagnosis not present

## 2018-06-21 DIAGNOSIS — D631 Anemia in chronic kidney disease: Secondary | ICD-10-CM | POA: Diagnosis not present

## 2018-06-21 DIAGNOSIS — I129 Hypertensive chronic kidney disease with stage 1 through stage 4 chronic kidney disease, or unspecified chronic kidney disease: Secondary | ICD-10-CM | POA: Diagnosis not present

## 2018-06-21 DIAGNOSIS — E1165 Type 2 diabetes mellitus with hyperglycemia: Secondary | ICD-10-CM | POA: Diagnosis not present

## 2018-06-21 DIAGNOSIS — N184 Chronic kidney disease, stage 4 (severe): Secondary | ICD-10-CM | POA: Diagnosis not present

## 2018-06-21 DIAGNOSIS — G40209 Localization-related (focal) (partial) symptomatic epilepsy and epileptic syndromes with complex partial seizures, not intractable, without status epilepticus: Secondary | ICD-10-CM | POA: Diagnosis not present

## 2018-06-22 DIAGNOSIS — E1122 Type 2 diabetes mellitus with diabetic chronic kidney disease: Secondary | ICD-10-CM | POA: Diagnosis not present

## 2018-06-22 DIAGNOSIS — E1165 Type 2 diabetes mellitus with hyperglycemia: Secondary | ICD-10-CM | POA: Diagnosis not present

## 2018-06-22 DIAGNOSIS — I129 Hypertensive chronic kidney disease with stage 1 through stage 4 chronic kidney disease, or unspecified chronic kidney disease: Secondary | ICD-10-CM | POA: Diagnosis not present

## 2018-06-22 DIAGNOSIS — N184 Chronic kidney disease, stage 4 (severe): Secondary | ICD-10-CM | POA: Diagnosis not present

## 2018-06-22 DIAGNOSIS — D631 Anemia in chronic kidney disease: Secondary | ICD-10-CM | POA: Diagnosis not present

## 2018-06-22 DIAGNOSIS — G40209 Localization-related (focal) (partial) symptomatic epilepsy and epileptic syndromes with complex partial seizures, not intractable, without status epilepticus: Secondary | ICD-10-CM | POA: Diagnosis not present

## 2018-06-23 DIAGNOSIS — E1165 Type 2 diabetes mellitus with hyperglycemia: Secondary | ICD-10-CM | POA: Diagnosis not present

## 2018-06-23 DIAGNOSIS — I129 Hypertensive chronic kidney disease with stage 1 through stage 4 chronic kidney disease, or unspecified chronic kidney disease: Secondary | ICD-10-CM | POA: Diagnosis not present

## 2018-06-23 DIAGNOSIS — G40209 Localization-related (focal) (partial) symptomatic epilepsy and epileptic syndromes with complex partial seizures, not intractable, without status epilepticus: Secondary | ICD-10-CM | POA: Diagnosis not present

## 2018-06-23 DIAGNOSIS — E1122 Type 2 diabetes mellitus with diabetic chronic kidney disease: Secondary | ICD-10-CM | POA: Diagnosis not present

## 2018-06-23 DIAGNOSIS — D631 Anemia in chronic kidney disease: Secondary | ICD-10-CM | POA: Diagnosis not present

## 2018-06-23 DIAGNOSIS — N184 Chronic kidney disease, stage 4 (severe): Secondary | ICD-10-CM | POA: Diagnosis not present

## 2018-06-24 ENCOUNTER — Telehealth: Payer: Self-pay | Admitting: Family Medicine

## 2018-06-24 NOTE — Telephone Encounter (Signed)
.   Pt will call back with the exact name of the medicine that was denied

## 2018-06-27 DIAGNOSIS — Z79899 Other long term (current) drug therapy: Secondary | ICD-10-CM | POA: Diagnosis not present

## 2018-06-27 DIAGNOSIS — E1142 Type 2 diabetes mellitus with diabetic polyneuropathy: Secondary | ICD-10-CM | POA: Diagnosis not present

## 2018-06-27 DIAGNOSIS — R2689 Other abnormalities of gait and mobility: Secondary | ICD-10-CM | POA: Diagnosis not present

## 2018-06-27 DIAGNOSIS — G40909 Epilepsy, unspecified, not intractable, without status epilepticus: Secondary | ICD-10-CM | POA: Diagnosis not present

## 2018-06-27 NOTE — Telephone Encounter (Signed)
levothyroxine (SYNTHROID, LEVOTHROID) 112 MCG tablet   Patients wife called in to find out why patients rx request was denied. pls advise. Cb# [phone]/ [phone] [facility] in [place].

## 2018-06-28 DIAGNOSIS — D631 Anemia in chronic kidney disease: Secondary | ICD-10-CM | POA: Diagnosis not present

## 2018-06-28 DIAGNOSIS — N184 Chronic kidney disease, stage 4 (severe): Secondary | ICD-10-CM | POA: Diagnosis not present

## 2018-06-28 DIAGNOSIS — G40209 Localization-related (focal) (partial) symptomatic epilepsy and epileptic syndromes with complex partial seizures, not intractable, without status epilepticus: Secondary | ICD-10-CM | POA: Diagnosis not present

## 2018-06-28 DIAGNOSIS — E1122 Type 2 diabetes mellitus with diabetic chronic kidney disease: Secondary | ICD-10-CM | POA: Diagnosis not present

## 2018-06-28 DIAGNOSIS — E1165 Type 2 diabetes mellitus with hyperglycemia: Secondary | ICD-10-CM | POA: Diagnosis not present

## 2018-06-28 DIAGNOSIS — I129 Hypertensive chronic kidney disease with stage 1 through stage 4 chronic kidney disease, or unspecified chronic kidney disease: Secondary | ICD-10-CM | POA: Diagnosis not present

## 2018-06-29 ENCOUNTER — Other Ambulatory Visit: Payer: Self-pay

## 2018-06-29 ENCOUNTER — Telehealth: Payer: Self-pay | Admitting: Family Medicine

## 2018-06-29 MED ORDER — LEVOTHYROXINE SODIUM 112 MCG PO TABS: 112.0000 ug | ORAL_TABLET | Freq: Every morning | ORAL | 0 refills | Status: DC

## 2018-06-29 NOTE — Telephone Encounter (Signed)
Noted, thank you

## 2018-06-29 NOTE — Telephone Encounter (Signed)
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 Cb#: [phone]

## 2018-06-29 NOTE — Telephone Encounter (Signed)
Called Debbie Dabs and gave verbal order to extend homehealth for 3 weeks due to patient having trouble bathing.

## 2018-06-30 DIAGNOSIS — E1122 Type 2 diabetes mellitus with diabetic chronic kidney disease: Secondary | ICD-10-CM | POA: Diagnosis not present

## 2018-06-30 DIAGNOSIS — G40209 Localization-related (focal) (partial) symptomatic epilepsy and epileptic syndromes with complex partial seizures, not intractable, without status epilepticus: Secondary | ICD-10-CM | POA: Diagnosis not present

## 2018-06-30 DIAGNOSIS — I129 Hypertensive chronic kidney disease with stage 1 through stage 4 chronic kidney disease, or unspecified chronic kidney disease: Secondary | ICD-10-CM | POA: Diagnosis not present

## 2018-06-30 DIAGNOSIS — E1165 Type 2 diabetes mellitus with hyperglycemia: Secondary | ICD-10-CM | POA: Diagnosis not present

## 2018-06-30 DIAGNOSIS — D631 Anemia in chronic kidney disease: Secondary | ICD-10-CM | POA: Diagnosis not present

## 2018-06-30 DIAGNOSIS — N184 Chronic kidney disease, stage 4 (severe): Secondary | ICD-10-CM | POA: Diagnosis not present

## 2018-07-01 DIAGNOSIS — E1122 Type 2 diabetes mellitus with diabetic chronic kidney disease: Secondary | ICD-10-CM | POA: Diagnosis not present

## 2018-07-01 DIAGNOSIS — D631 Anemia in chronic kidney disease: Secondary | ICD-10-CM | POA: Diagnosis not present

## 2018-07-01 DIAGNOSIS — E1165 Type 2 diabetes mellitus with hyperglycemia: Secondary | ICD-10-CM | POA: Diagnosis not present

## 2018-07-01 DIAGNOSIS — G40209 Localization-related (focal) (partial) symptomatic epilepsy and epileptic syndromes with complex partial seizures, not intractable, without status epilepticus: Secondary | ICD-10-CM | POA: Diagnosis not present

## 2018-07-01 DIAGNOSIS — I129 Hypertensive chronic kidney disease with stage 1 through stage 4 chronic kidney disease, or unspecified chronic kidney disease: Secondary | ICD-10-CM | POA: Diagnosis not present

## 2018-07-01 DIAGNOSIS — N184 Chronic kidney disease, stage 4 (severe): Secondary | ICD-10-CM | POA: Diagnosis not present

## 2018-07-04 DIAGNOSIS — D631 Anemia in chronic kidney disease: Secondary | ICD-10-CM | POA: Diagnosis not present

## 2018-07-04 DIAGNOSIS — E1122 Type 2 diabetes mellitus with diabetic chronic kidney disease: Secondary | ICD-10-CM | POA: Diagnosis not present

## 2018-07-04 DIAGNOSIS — I129 Hypertensive chronic kidney disease with stage 1 through stage 4 chronic kidney disease, or unspecified chronic kidney disease: Secondary | ICD-10-CM | POA: Diagnosis not present

## 2018-07-04 DIAGNOSIS — G40209 Localization-related (focal) (partial) symptomatic epilepsy and epileptic syndromes with complex partial seizures, not intractable, without status epilepticus: Secondary | ICD-10-CM | POA: Diagnosis not present

## 2018-07-04 DIAGNOSIS — N184 Chronic kidney disease, stage 4 (severe): Secondary | ICD-10-CM | POA: Diagnosis not present

## 2018-07-04 DIAGNOSIS — E1165 Type 2 diabetes mellitus with hyperglycemia: Secondary | ICD-10-CM | POA: Diagnosis not present

## 2018-07-05 DIAGNOSIS — D631 Anemia in chronic kidney disease: Secondary | ICD-10-CM | POA: Diagnosis not present

## 2018-07-05 DIAGNOSIS — E1122 Type 2 diabetes mellitus with diabetic chronic kidney disease: Secondary | ICD-10-CM | POA: Diagnosis not present

## 2018-07-05 DIAGNOSIS — I129 Hypertensive chronic kidney disease with stage 1 through stage 4 chronic kidney disease, or unspecified chronic kidney disease: Secondary | ICD-10-CM | POA: Diagnosis not present

## 2018-07-05 DIAGNOSIS — N184 Chronic kidney disease, stage 4 (severe): Secondary | ICD-10-CM | POA: Diagnosis not present

## 2018-07-05 DIAGNOSIS — E1165 Type 2 diabetes mellitus with hyperglycemia: Secondary | ICD-10-CM | POA: Diagnosis not present

## 2018-07-05 DIAGNOSIS — G40209 Localization-related (focal) (partial) symptomatic epilepsy and epileptic syndromes with complex partial seizures, not intractable, without status epilepticus: Secondary | ICD-10-CM | POA: Diagnosis not present

## 2018-07-07 DIAGNOSIS — E1165 Type 2 diabetes mellitus with hyperglycemia: Secondary | ICD-10-CM | POA: Diagnosis not present

## 2018-07-07 DIAGNOSIS — I129 Hypertensive chronic kidney disease with stage 1 through stage 4 chronic kidney disease, or unspecified chronic kidney disease: Secondary | ICD-10-CM | POA: Diagnosis not present

## 2018-07-07 DIAGNOSIS — G40209 Localization-related (focal) (partial) symptomatic epilepsy and epileptic syndromes with complex partial seizures, not intractable, without status epilepticus: Secondary | ICD-10-CM | POA: Diagnosis not present

## 2018-07-07 DIAGNOSIS — E1122 Type 2 diabetes mellitus with diabetic chronic kidney disease: Secondary | ICD-10-CM | POA: Diagnosis not present

## 2018-07-07 DIAGNOSIS — D631 Anemia in chronic kidney disease: Secondary | ICD-10-CM | POA: Diagnosis not present

## 2018-07-07 DIAGNOSIS — N184 Chronic kidney disease, stage 4 (severe): Secondary | ICD-10-CM | POA: Diagnosis not present

## 2018-07-08 DIAGNOSIS — E1122 Type 2 diabetes mellitus with diabetic chronic kidney disease: Secondary | ICD-10-CM | POA: Diagnosis not present

## 2018-07-08 DIAGNOSIS — E1165 Type 2 diabetes mellitus with hyperglycemia: Secondary | ICD-10-CM | POA: Diagnosis not present

## 2018-07-08 DIAGNOSIS — D631 Anemia in chronic kidney disease: Secondary | ICD-10-CM | POA: Diagnosis not present

## 2018-07-08 DIAGNOSIS — G40209 Localization-related (focal) (partial) symptomatic epilepsy and epileptic syndromes with complex partial seizures, not intractable, without status epilepticus: Secondary | ICD-10-CM | POA: Diagnosis not present

## 2018-07-08 DIAGNOSIS — N184 Chronic kidney disease, stage 4 (severe): Secondary | ICD-10-CM | POA: Diagnosis not present

## 2018-07-08 DIAGNOSIS — I129 Hypertensive chronic kidney disease with stage 1 through stage 4 chronic kidney disease, or unspecified chronic kidney disease: Secondary | ICD-10-CM | POA: Diagnosis not present

## 2018-07-12 ENCOUNTER — Ambulatory Visit: Payer: Medicare Other | Admitting: Family Medicine

## 2018-07-12 DIAGNOSIS — I129 Hypertensive chronic kidney disease with stage 1 through stage 4 chronic kidney disease, or unspecified chronic kidney disease: Secondary | ICD-10-CM | POA: Diagnosis not present

## 2018-07-12 DIAGNOSIS — G40209 Localization-related (focal) (partial) symptomatic epilepsy and epileptic syndromes with complex partial seizures, not intractable, without status epilepticus: Secondary | ICD-10-CM | POA: Diagnosis not present

## 2018-07-12 DIAGNOSIS — D631 Anemia in chronic kidney disease: Secondary | ICD-10-CM | POA: Diagnosis not present

## 2018-07-12 DIAGNOSIS — N184 Chronic kidney disease, stage 4 (severe): Secondary | ICD-10-CM | POA: Diagnosis not present

## 2018-07-12 DIAGNOSIS — E1122 Type 2 diabetes mellitus with diabetic chronic kidney disease: Secondary | ICD-10-CM | POA: Diagnosis not present

## 2018-07-12 DIAGNOSIS — E1165 Type 2 diabetes mellitus with hyperglycemia: Secondary | ICD-10-CM | POA: Diagnosis not present

## 2018-07-13 NOTE — Telephone Encounter (Signed)
If still unable to contact and not on medication send a letter pls

## 2018-07-14 DIAGNOSIS — E1165 Type 2 diabetes mellitus with hyperglycemia: Secondary | ICD-10-CM | POA: Diagnosis not present

## 2018-07-14 DIAGNOSIS — G40209 Localization-related (focal) (partial) symptomatic epilepsy and epileptic syndromes with complex partial seizures, not intractable, without status epilepticus: Secondary | ICD-10-CM | POA: Diagnosis not present

## 2018-07-14 DIAGNOSIS — I129 Hypertensive chronic kidney disease with stage 1 through stage 4 chronic kidney disease, or unspecified chronic kidney disease: Secondary | ICD-10-CM | POA: Diagnosis not present

## 2018-07-14 DIAGNOSIS — E1122 Type 2 diabetes mellitus with diabetic chronic kidney disease: Secondary | ICD-10-CM | POA: Diagnosis not present

## 2018-07-14 DIAGNOSIS — N184 Chronic kidney disease, stage 4 (severe): Secondary | ICD-10-CM | POA: Diagnosis not present

## 2018-07-14 DIAGNOSIS — D631 Anemia in chronic kidney disease: Secondary | ICD-10-CM | POA: Diagnosis not present

## 2018-07-20 DIAGNOSIS — N183 Chronic kidney disease, stage 3 (moderate): Secondary | ICD-10-CM | POA: Diagnosis not present

## 2018-07-20 DIAGNOSIS — R809 Proteinuria, unspecified: Secondary | ICD-10-CM | POA: Diagnosis not present

## 2018-07-20 DIAGNOSIS — E559 Vitamin D deficiency, unspecified: Secondary | ICD-10-CM | POA: Diagnosis not present

## 2018-07-20 DIAGNOSIS — D509 Iron deficiency anemia, unspecified: Secondary | ICD-10-CM | POA: Diagnosis not present

## 2018-07-20 DIAGNOSIS — Z79899 Other long term (current) drug therapy: Secondary | ICD-10-CM | POA: Diagnosis not present

## 2018-07-21 DIAGNOSIS — E1165 Type 2 diabetes mellitus with hyperglycemia: Secondary | ICD-10-CM | POA: Diagnosis not present

## 2018-07-21 DIAGNOSIS — D631 Anemia in chronic kidney disease: Secondary | ICD-10-CM | POA: Diagnosis not present

## 2018-07-21 DIAGNOSIS — I129 Hypertensive chronic kidney disease with stage 1 through stage 4 chronic kidney disease, or unspecified chronic kidney disease: Secondary | ICD-10-CM | POA: Diagnosis not present

## 2018-07-21 DIAGNOSIS — G40209 Localization-related (focal) (partial) symptomatic epilepsy and epileptic syndromes with complex partial seizures, not intractable, without status epilepticus: Secondary | ICD-10-CM | POA: Diagnosis not present

## 2018-07-21 DIAGNOSIS — N184 Chronic kidney disease, stage 4 (severe): Secondary | ICD-10-CM | POA: Diagnosis not present

## 2018-07-21 DIAGNOSIS — E1122 Type 2 diabetes mellitus with diabetic chronic kidney disease: Secondary | ICD-10-CM | POA: Diagnosis not present

## 2018-07-22 DIAGNOSIS — E1122 Type 2 diabetes mellitus with diabetic chronic kidney disease: Secondary | ICD-10-CM | POA: Diagnosis not present

## 2018-07-22 DIAGNOSIS — G40209 Localization-related (focal) (partial) symptomatic epilepsy and epileptic syndromes with complex partial seizures, not intractable, without status epilepticus: Secondary | ICD-10-CM | POA: Diagnosis not present

## 2018-07-22 DIAGNOSIS — D631 Anemia in chronic kidney disease: Secondary | ICD-10-CM | POA: Diagnosis not present

## 2018-07-22 DIAGNOSIS — I129 Hypertensive chronic kidney disease with stage 1 through stage 4 chronic kidney disease, or unspecified chronic kidney disease: Secondary | ICD-10-CM | POA: Diagnosis not present

## 2018-07-22 DIAGNOSIS — E1165 Type 2 diabetes mellitus with hyperglycemia: Secondary | ICD-10-CM | POA: Diagnosis not present

## 2018-07-22 DIAGNOSIS — N184 Chronic kidney disease, stage 4 (severe): Secondary | ICD-10-CM | POA: Diagnosis not present

## 2018-07-25 ENCOUNTER — Other Ambulatory Visit: Payer: Self-pay | Admitting: Family Medicine

## 2018-07-26 ENCOUNTER — Telehealth: Payer: Self-pay | Admitting: Family Medicine

## 2018-07-26 ENCOUNTER — Other Ambulatory Visit: Payer: Self-pay

## 2018-07-26 DIAGNOSIS — E782 Mixed hyperlipidemia: Secondary | ICD-10-CM

## 2018-07-26 MED ORDER — PRAVASTATIN SODIUM 20 MG PO TABS: 20.0000 mg | ORAL_TABLET | Freq: Every day | ORAL | 3 refills | Status: DC

## 2018-07-26 NOTE — Telephone Encounter (Signed)
Patient's wife returned your call

## 2018-07-26 NOTE — Telephone Encounter (Signed)
Done

## 2018-07-26 NOTE — Telephone Encounter (Signed)
Mailed result letter this am and I did try to call pts wife back after she called but I got the voicemail again

## 2018-07-26 NOTE — Telephone Encounter (Signed)
New Message  Chase [name] is needing most recent labs faxed to info below.  [phone]

## 2018-07-26 NOTE — Telephone Encounter (Signed)
Done and labs and letter sent with med

## 2018-08-05 ENCOUNTER — Other Ambulatory Visit: Payer: Self-pay | Admitting: "Endocrinology

## 2018-08-05 ENCOUNTER — Encounter: Payer: Self-pay | Admitting: "Endocrinology

## 2018-08-11 ENCOUNTER — Ambulatory Visit (INDEPENDENT_AMBULATORY_CARE_PROVIDER_SITE_OTHER): Payer: Medicare Other | Admitting: Internal Medicine

## 2018-08-17 ENCOUNTER — Ambulatory Visit (INDEPENDENT_AMBULATORY_CARE_PROVIDER_SITE_OTHER): Payer: Medicare Other | Admitting: "Endocrinology

## 2018-08-17 ENCOUNTER — Other Ambulatory Visit: Payer: Self-pay | Admitting: Family Medicine

## 2018-08-17 ENCOUNTER — Encounter: Payer: Self-pay | Admitting: "Endocrinology

## 2018-08-17 VITALS — BP 132/83 | HR 71

## 2018-08-17 DIAGNOSIS — E782 Mixed hyperlipidemia: Secondary | ICD-10-CM

## 2018-08-17 DIAGNOSIS — E038 Other specified hypothyroidism: Secondary | ICD-10-CM | POA: Diagnosis not present

## 2018-08-17 DIAGNOSIS — Z794 Long term (current) use of insulin: Secondary | ICD-10-CM | POA: Diagnosis not present

## 2018-08-17 DIAGNOSIS — I10 Essential (primary) hypertension: Secondary | ICD-10-CM

## 2018-08-17 DIAGNOSIS — E1122 Type 2 diabetes mellitus with diabetic chronic kidney disease: Secondary | ICD-10-CM

## 2018-08-17 DIAGNOSIS — N184 Chronic kidney disease, stage 4 (severe): Secondary | ICD-10-CM

## 2018-08-17 DIAGNOSIS — E1165 Type 2 diabetes mellitus with hyperglycemia: Secondary | ICD-10-CM

## 2018-08-17 DIAGNOSIS — IMO0002 Reserved for concepts with insufficient information to code with codable children: Secondary | ICD-10-CM

## 2018-08-17 MED ORDER — LEVOTHYROXINE SODIUM 112 MCG PO TABS: 112.0000 ug | ORAL_TABLET | Freq: Every day | ORAL | 3 refills | Status: DC

## 2018-08-17 NOTE — Progress Notes (Signed)
Subjective:    Patient ID: Chase [name], male    DOB: 06-16-1931, PCP [name], MD   Past Medical History:  Diagnosis Date  . Bradycardia 03/15/2012  . Carotid artery occlusion   . Choledocholithiasis 02/25/2018  . CKD (chronic kidney disease) stage 4, GFR 15-29 ml/min (HCC)   . Complete lesion of cervical spinal cord ([place]) 03/14/3012   Stable since 2006  . CVA (cerebrovascular accident) ([place]) 09/07/12   right sided weakness  . Depressive disorder, not elsewhere classified   . Diabetes mellitus approx 1994  . Diabetic neuropathy ([place])   . History of kidney stones   . Hypertensive heart disease   . Hypothyroidism approx 2000  . Lacunar stroke, acute ([place]) 03/14/2012  . Obesity   . Osteoarthrosis, unspecified whether generalized or localized, lower leg   . Other and unspecified hyperlipidemia   . Peripheral vascular disease, unspecified ([place])   . Seizures ([place])    unknown etiology; on meds, last seizure was 2015  . Spinal stenosis, unspecified region other than cervical   . Spondylosis of unspecified site without mention of myelopathy    Past Surgical History:  Procedure Laterality Date  . BILIARY STENT PLACEMENT N/A 05/27/2018   Procedure: BILIARY STENT PLACEMENT;  Surgeon: Rogene Houston, MD;  [facility];  Service: Endoscopy;  Laterality: N/A;  . COLONOSCOPY N/A 08/10/2013   Procedure: COLONOSCOPY;  Surgeon: Rogene Houston, MD;  [facility];  Service: Endoscopy;  Laterality: N/A;  240  . ERCP N/A 03/02/2018   Procedure: ENDOSCOPIC RETROGRADE CHOLANGIOPANCREATOGRAPHY (ERCP) With sphincterotomy and stent placement;  Surgeon: Rogene Houston, MD;  [facility];  Service: Gastroenterology;  Laterality: N/A;  . ERCP N/A 05/27/2018   Procedure: ENDOSCOPIC RETROGRADE CHOLANGIOPANCREATOGRAPHY (ERCP);  Surgeon: Rogene Houston, MD;  [facility];  Service: Endoscopy;  Laterality: N/A;  . GASTROINTESTINAL STENT REMOVAL N/A  05/27/2018   Procedure: Biliary STENT REMOVAL;  Surgeon: Rogene Houston, MD;  [facility];  Service: Endoscopy;  Laterality: N/A;  . kidney stones left x2  1975  . KIDNEY SURGERY     Ruptured left kidney 30 yrs ago  from a kidney stone  . LITHOTRIPSY N/A 05/27/2018   Procedure: MECHANICAL LITHOTRIPSY;  Surgeon: Rogene Houston, MD;  [facility];  Service: Endoscopy;  Laterality: N/A;  . SPHINCTEROTOMY N/A 05/27/2018   Procedure: SPHINCTEROTOMY extended;  Surgeon: Rogene Houston, MD;  [facility];  Service: Endoscopy;  Laterality: N/A;  . SPYGLASS CHOLANGIOSCOPY N/A 05/27/2018   Procedure: GHWEXHBZ CHOLANGIOSCOPY;  Surgeon: Rogene Houston, MD;  [facility];  Service: Endoscopy;  Laterality: N/A;   Social History   Socioeconomic History  . Marital status: Married    Spouse name: Not on file  . Number of children: 5  . Years of education: Not on file  . Highest education level: Not on file  Occupational History  . Occupation: retired     Fish farm manager: RETIRED  Social Needs  . Financial resource strain: Not on file  . Food insecurity:    Worry: Not on file    Inability: Not on file  . Transportation needs:    Medical: Not on file    Non-medical: Not on file  Tobacco Use  . Smoking status: Never Smoker  . Smokeless tobacco: Never Used  Substance and Sexual Activity  . Alcohol use: No    Alcohol/week: 0.0 standard drinks  .  Drug use: No  . Sexual activity: Never  Lifestyle  . Physical activity:    Days per week: Not on file    Minutes per session: Not on file  . Stress: Not on file  Relationships  . Social connections:    Talks on phone: Not on file    Gets together: Not on file    Attends religious service: Not on file    Active member of club or organization: Not on file    Attends meetings of clubs or organizations: Not on file    Relationship status: Not on file  Other Topics Concern  . Not on file  Social History  Narrative  . Not on file   Outpatient Encounter Medications as of 08/17/2018  Medication Sig  . amLODipine (NORVASC) 10 MG tablet TAKE 1 TABLET BY MOUTH ONCE DAILY  . aspirin EC 81 MG tablet Take 1 tablet (81 mg total) by mouth daily.  . calcium carbonate (TUMS - DOSED IN MG ELEMENTAL CALCIUM) 500 MG chewable tablet Chew 1 tablet by mouth 2 (two) times daily. For Bone Health  . clopidogrel (PLAVIX) 75 MG tablet TAKE 1 TABLET BY MOUTH ONCE DAILY  . Insulin Isophane & Regular Human (NOVOLIN 70/30 FLEXPEN RELION) (70-30) 100 UNIT/ML PEN Inject 10 Units into the skin 2 (two) times daily before a meal.  . levETIRAcetam (KEPPRA) 250 MG tablet Take 250 mg by mouth at bedtime.   Marland Kitchen levothyroxine (SYNTHROID, LEVOTHROID) 112 MCG tablet Take 1 tablet (112 mcg total) by mouth daily before breakfast.  . Multiple Vitamins-Minerals (MULTIVITAMIN WITH MINERALS) tablet Take 1 tablet by mouth daily.  Marland Kitchen olopatadine (PATANOL) 0.1 % ophthalmic solution Place 1 drop into both eyes 2 (two) times daily.  . ONE TOUCH ULTRA TEST test strip USE 1 STRIP TO CHECK GLUCOSE TWICE DAILY  . ONETOUCH DELICA LANCETS 09N MISC   . pravastatin (PRAVACHOL) 20 MG tablet Take 1 tablet (20 mg total) by mouth daily.  . tamsulosin (FLOMAX) 0.4 MG CAPS capsule Take 0.4 mg by mouth daily.   . Vitamin D, Ergocalciferol, (DRISDOL) 50000 units CAPS capsule TAKE 1 CAPSULE BY MOUTH ONCE A WEEK  . [DISCONTINUED] levothyroxine (SYNTHROID, LEVOTHROID) 112 MCG tablet TAKE 1 TABLET BY MOUTH ONCE DAILY IN THE MORNING  . [DISCONTINUED] Multiple Vitamins-Minerals (EYE VITAMINS PO) Take 1 tablet by mouth daily.    No facility-administered encounter medications on file as of 08/17/2018.    ALLERGIES: Allergies  Allergen Reactions  . Bayer Advanced Aspirin [Aspirin] Nausea And Vomiting  . Penicillins Nausea And Vomiting    Has patient had a PCN reaction causing immediate rash, facial/tongue/throat swelling, SOB or lightheadedness with hypotension:  unknown Has patient had a PCN reaction causing severe rash involving mucus membranes or skin necrosis: unknown Has patient had a PCN reaction that required hospitalization: unknown Has patient had a PCN reaction occurring within the last 10 years: no If all of the above answers are "NO", then may proceed with Cephalosporin use.   VACCINATION STATUS: Immunization History  Administered Date(s) Administered  . H1N1 11/14/2008  . Influenza Split 10/07/2011, 09/08/2012  . Influenza Whole 08/22/2007, 08/27/2010  . Influenza,inj,Quad PF,6+ Mos 08/22/2013, 09/26/2014, 09/26/2015, 09/01/2016, 09/20/2017  . Pneumococcal Conjugate-13 06/12/2014  . Pneumococcal Polysaccharide-23 05/21/2004  . Td 05/21/2004  . Tdap 10/07/2011    Diabetes  He presents for his follow-up diabetic visit. He has type 2 diabetes mellitus. Onset time: He was diagnosed at approximate age of 66 years. His disease course has been  stable. There are no hypoglycemic associated symptoms. Pertinent negatives for hypoglycemia include no confusion, headaches, pallor or seizures. There are no diabetic associated symptoms. Pertinent negatives for diabetes include no chest pain, no fatigue, no polydipsia, no polyphagia, no polyuria and no weakness. There are no hypoglycemic complications. Symptoms are stable. Diabetic complications include nephropathy and PVD. Risk factors for coronary artery disease include diabetes mellitus, dyslipidemia, male sex, obesity and sedentary lifestyle. Current diabetic treatment includes insulin injections. He is compliant with treatment most of the time. His weight is decreasing steadily. He is following a diabetic diet. When asked about meal planning, he reported none. He never participates in exercise. His home blood glucose trend is decreasing steadily. His breakfast blood glucose range is generally 140-180 mg/dl. His bedtime blood glucose range is generally >200 mg/dl. His overall blood glucose range is  180-200 mg/dl.  Hyperlipidemia  This is a chronic problem. The current episode started more than 1 year ago. Exacerbating diseases include diabetes and hypothyroidism. Pertinent negatives include no chest pain, myalgias or shortness of breath. Current antihyperlipidemic treatment includes statins. Risk factors for coronary artery disease include dyslipidemia, diabetes mellitus, hypertension, male sex and a sedentary lifestyle.  Hypertension  This is a chronic problem. The current episode started more than 1 year ago. Pertinent negatives include no chest pain, headaches, neck pain, palpitations or shortness of breath. Risk factors for coronary artery disease include diabetes mellitus, dyslipidemia and sedentary lifestyle. Past treatments include direct vasodilators. Hypertensive end-organ damage includes PVD. Identifiable causes of hypertension include a thyroid problem.  Thyroid Problem  Presents for follow-up visit. Patient reports no constipation, diarrhea, fatigue or palpitations. The symptoms have been stable. Past treatments include levothyroxine. His past medical history is significant for diabetes and hyperlipidemia.     Review of Systems  Constitutional: Negative for chills, fatigue, fever and unexpected weight change.  HENT: Negative for dental problem, mouth sores and trouble swallowing.   Eyes: Negative for visual disturbance.  Respiratory: Negative for cough, choking, chest tightness, shortness of breath and wheezing.   Cardiovascular: Negative for chest pain, palpitations and leg swelling.  Gastrointestinal: Negative for abdominal distention, abdominal pain, constipation, diarrhea, nausea and vomiting.  Endocrine: Negative for polydipsia, polyphagia and polyuria.  Genitourinary: Negative for dysuria, flank pain, hematuria and urgency.  Musculoskeletal: Positive for gait problem. Negative for back pain, myalgias and neck pain.       Walks with a walker, due to disequilibrium and  arthritis.  Skin: Negative for pallor, rash and wound.  Neurological: Negative for seizures, syncope, weakness, numbness and headaches.  Psychiatric/Behavioral: Negative.  Negative for confusion and dysphoric mood.    Objective:    BP 132/83   Pulse 71   Wt Readings from Last 3 Encounters:  06/07/18 176 lb 4 oz (79.9 kg)  05/23/18 167 lb (75.8 kg)  03/03/18 167 lb 8.8 oz (76 kg)    Physical Exam  Constitutional: He is oriented to person, place, and time. He appears well-developed and well-nourished. He is cooperative. No distress.  HENT:  Head: Normocephalic and atraumatic.  Eyes: EOM are normal.  Neck: Normal range of motion. Neck supple. No tracheal deviation present. No thyromegaly present.  Cardiovascular: Normal rate, S1 normal, S2 normal and normal heart sounds. Exam reveals no gallop.  No murmur heard. Pulses:      Dorsalis pedis pulses are 1+ on the right side, and 1+ on the left side.       Posterior tibial pulses are 1+ on the right side,  and 1+ on the left side.  Pulmonary/Chest: Effort normal. No respiratory distress. He has no wheezes.  Abdominal: Soft. Bowel sounds are normal. He exhibits no distension. There is no tenderness. There is no guarding and no CVA tenderness.  Musculoskeletal: He exhibits edema.       Right shoulder: He exhibits no deformity.       Left ankle: He exhibits no swelling.       Right foot: There is no swelling.       Left foot: There is no swelling.  Patient walks with a walker due to disequilibrium.  Neurological: He is alert and oriented to person, place, and time. He has normal strength. No cranial nerve deficit or sensory deficit. Gait normal.  Skin: Skin is warm and dry. No rash noted. No cyanosis. Nails show no clubbing.  Psychiatric: He has a normal mood and affect. His speech is normal. Cognition and memory are normal.  He has moderate cognitive deficit.    CMP     Component Value Date/Time   NA 137 06/02/2018 1338   K 4.3  06/02/2018 1338   CL 104 06/02/2018 1338   CO2 22 06/02/2018 1338   GLUCOSE 163 (H) 06/02/2018 1338   BUN 52 (H) 06/02/2018 1338   CREATININE 3.56 (H) 06/02/2018 1338   CALCIUM 9.3 06/02/2018 1338   PROT 7.2 06/02/2018 1338   ALBUMIN 3.7 05/27/2018 1016   AST 30 06/02/2018 1338   ALT 43 06/02/2018 1338   ALKPHOS 118 05/27/2018 1016   BILITOT 0.5 06/02/2018 1338   GFRNONAA 15 (L) 06/02/2018 1338   GFRAA 17 (L) 06/02/2018 1338   Diabetic Labs (most recent): Lab Results  Component Value Date   HGBA1C 7.5 (H) 06/02/2018   HGBA1C 6.7 (H) 02/08/2018   HGBA1C 6.2 (H) 08/04/2017    Lipid Panel     Component Value Date/Time   CHOL 212 (H) 06/02/2018 1338   TRIG 93 06/02/2018 1338   HDL 67 06/02/2018 1338   CHOLHDL 3.2 06/02/2018 1338   VLDL 8 03/12/2016 1139   LDLCALC 126 (H) 06/02/2018 1338     Assessment & Plan:   1.  type 2 diabetes mellitus with stage 4 chronic kidney disease, with long-term current use of insulin ([place]) -his diabetes is  complicated by chronic kidney disease stage IV and patient remains at a high risk for more acute and chronic complications of diabetes which include CAD, CVA, CKD, retinopathy, and neuropathy. These are all discussed in detail with the patient.  -His most recent labs show A1c of 7.5%, acceptable target for him.  Glucose logs and insulin administration records pertaining to this visit,  to be scanned into patient's records.  Recent labs reviewed.   - I have re-counseled the patient on diet management   by adopting a carbohydrate restricted / protein rich  Diet.  -  Suggestion is made for him to avoid simple carbohydrates  from his diet including Cakes, Sweet Desserts / Pastries, Ice Cream, Soda (diet and regular), Sweet Tea, Candies, Chips, Cookies, Store Bought Juices, Alcohol in Excess of  1-2 drinks a day, Artificial Sweeteners, and "Sugar-free" Products. This will help patient to have stable blood glucose profile and potentially avoid  unintended weight gain.   - Patient is advised to stick to a routine mealtimes to eat 3 meals  a day and avoid unnecessary snacks ( to snack only to correct hypoglycemia).  - I have approached patient with the following individualized plan to manage diabetes  and patient agrees.  - I advised him and his wife who is the primary caregiver to continue 75/25  10 units twice a day with breakfast and supper when pre-meal blood glucose is above 90 mg/dL, associated with monitoring of blood glucose at least 2 times a day- before breakfast and before supper.     - Patient is warned not to take insulin without proper monitoring per orders. -Adjustment parameters are given for hypo and hyperglycemia in writing. -Patient is encouraged to call clinic for blood glucose levels less than 70 or above 300 mg /dl. -Patient is not a candidate for metformin,SGLT2 inhibitors due to CKD.  - Patient specific target  for A1c; LDL, HDL, Triglycerides, and  Waist Circumference were discussed in detail.  2) BP/HTN: His blood pressure is controlled to target , he is advised to continue his current medications.   3) Lipids/HPL: His lipid panel has been stable for 2 years, I have advised him to discontinue statins at this time.    4) hypothyroidism:   -I advised him to continue levothyroxine 112 mcg p.o. daily before breakfast.     - We discussed about correct intake of levothyroxine, at fasting, with water, separated by at least 30 minutes from breakfast, and separated by more than 4 hours from calcium, iron, multivitamins, acid reflux medications (PPIs). -Patient is made aware of the fact that thyroid hormone replacement is needed for life, dose to be adjusted by periodic monitoring of thyroid function tests.  5) Chronic Care/Health Maintenance:  -Patient is on Statin medications and encouraged to continue to follow up with Ophthalmology, Podiatrist at least yearly or according to recommendations, and advised to   stay away from smoking. I have recommended yearly flu vaccine and pneumonia vaccination at least every 5 years; he cannot exercise optimally due to deconditioning and disequilibrium, and  sleep for at least 7 hours a day.  - I advised patient to maintain close follow up with [name], MD for primary care needs.  - Time spent with the patient: 25 min, of which >50% was spent in reviewing his blood glucose logs , discussing his hypo- and hyper-glycemic episodes, reviewing his current and  previous labs and insulin doses and developing a plan to avoid hypo- and hyper-glycemia. Please refer to Patient Instructions for Blood Glucose Monitoring and Insulin/Medications Dosing Guide"  in media tab for additional information. [name] participated in the discussions, expressed understanding, and voiced agreement with the above plans.  All questions were answered to his satisfaction. he is encouraged to contact clinic should he have any questions or concerns prior to his return visit.  Follow up plan: -Return in about 6 months (around 02/15/2019) for Meter, and Logs.  Glade Lloyd, MD Phone: [phone]  Fax: [phone]  This note was partially dictated with voice recognition software. Similar sounding words can be transcribed inadequately or may not  be corrected upon review.  08/17/2018, 4:41 PM

## 2018-08-23 ENCOUNTER — Encounter (INDEPENDENT_AMBULATORY_CARE_PROVIDER_SITE_OTHER): Payer: Self-pay | Admitting: Internal Medicine

## 2018-08-23 ENCOUNTER — Ambulatory Visit (INDEPENDENT_AMBULATORY_CARE_PROVIDER_SITE_OTHER): Payer: Medicare Other | Admitting: Internal Medicine

## 2018-08-23 VITALS — BP 136/80 | HR 64 | Ht 68.0 in | Wt 172.7 lb

## 2018-08-23 DIAGNOSIS — K8050 Calculus of bile duct without cholangitis or cholecystitis without obstruction: Secondary | ICD-10-CM

## 2018-08-23 NOTE — Patient Instructions (Signed)
ERCP 1st week in October.

## 2018-08-23 NOTE — Progress Notes (Signed)
Subjective:    Patient ID: Chase [name], male    DOB: 11/09/1931, 84 y.o.   MRN: [id number]  HPI Here today for f/u.  He underwent an ERCP in June of this year with stent exchange, extension of biliary sphincterotomy, spyglass examination and mechanical lithotripsy  Occluded biliary stent removed).  Per Dr. Laural Golden records, about 6 months ago he presented with abdominal pain, elevated transaminases and biliary fistula drainage. He underwent an ERCP 03/02/2018 and was found to have large stone in the bile duct, felt to be extending into the cystic duct or outside the bilie duct (Mirizzi syndrome). Biliary stent was placed. Transaminases returned to normal and the biliary fistula healed.  Three years ago he was hospitalized with acute cholecystitis. He was too high risk for cholecystectomy and underwent a cholecystostomy which was removed later. He continue to have intermittent biliary leakage from the cholecystostomy site. Family in room. He has no abdominal pain .  BMs are normal. Appetite is good.  He denies any abdominal pain.  Dr. Laural Golden in with patient also.   Hepatic Function Latest Ref Rng & Units 06/02/2018 05/27/2018 03/03/2018  Total Protein 6.1 - 8.1 g/dL 7.2 7.6 6.0(L)  Albumin 3.5 - 5.0 g/dL - 3.7 2.9(L)  AST 10 - 35 U/L 30 23 47(H)  ALT 9 - 46 U/L 43 20 101(H)  Alk Phosphatase 38 - 126 U/L - 118 199(H)  Total Bilirubin 0.2 - 1.2 mg/dL 0.5 0.6 0.9  Bilirubin, Direct 0.1 - 0.5 mg/dL - - -    CBC    Component Value Date/Time   WBC 6.5 05/23/2018 1359   RBC 3.72 (L) 05/23/2018 1359   HGB 11.6 (L) 05/23/2018 1359   HCT 37.2 (L) 05/23/2018 1359   PLT SPECIMEN CHECKED FOR CLOTS 05/23/2018 1359   MCV 100.0 05/23/2018 1359   MCH 31.2 05/23/2018 1359   MCHC 31.2 05/23/2018 1359   RDW 13.3 05/23/2018 1359   LYMPHSABS 0.7 02/26/2018 0614   MONOABS 1.0 02/26/2018 0614   EOSABS 0.3 02/26/2018 0614   BASOSABS 0.0 02/26/2018 9628     Review of Systems Past Medical History:    Diagnosis Date  . Bradycardia 03/15/2012  . Carotid artery occlusion   . Choledocholithiasis 02/25/2018  . CKD (chronic kidney disease) stage 4, GFR 15-29 ml/min (HCC)   . Complete lesion of cervical spinal cord ([place]) 03/14/3012   Stable since 2006  . CVA (cerebrovascular accident) ([place]) 09/07/12   right sided weakness  . Depressive disorder, not elsewhere classified   . Diabetes mellitus approx 1994  . Diabetic neuropathy ([place])   . History of kidney stones   . Hypertensive heart disease   . Hypothyroidism approx 2000  . Lacunar stroke, acute ([place]) 03/14/2012  . Obesity   . Osteoarthrosis, unspecified whether generalized or localized, lower leg   . Other and unspecified hyperlipidemia   . Peripheral vascular disease, unspecified ([place])   . Seizures ([place])    unknown etiology; on meds, last seizure was 2015  . Spinal stenosis, unspecified region other than cervical   . Spondylosis of unspecified site without mention of myelopathy     Past Surgical History:  Procedure Laterality Date  . BILIARY STENT PLACEMENT N/A 05/27/2018   Procedure: BILIARY STENT PLACEMENT;  Surgeon: Rogene Houston, MD;  [facility];  Service: Endoscopy;  Laterality: N/A;  . COLONOSCOPY N/A 08/10/2013   Procedure: COLONOSCOPY;  Surgeon: Rogene Houston, MD;  [facility];  Service: Endoscopy;  Laterality: N/A;  240  . ERCP N/A 03/02/2018   Procedure: ENDOSCOPIC RETROGRADE CHOLANGIOPANCREATOGRAPHY (ERCP) With sphincterotomy and stent placement;  Surgeon: Rogene Houston, MD;  [facility];  Service: Gastroenterology;  Laterality: N/A;  . ERCP N/A 05/27/2018   Procedure: ENDOSCOPIC RETROGRADE CHOLANGIOPANCREATOGRAPHY (ERCP);  Surgeon: Rogene Houston, MD;  [facility];  Service: Endoscopy;  Laterality: N/A;  . GASTROINTESTINAL STENT REMOVAL N/A 05/27/2018   Procedure: Biliary STENT REMOVAL;  Surgeon: Rogene Houston, MD;  [facility];  Service: Endoscopy;   Laterality: N/A;  . kidney stones left x2  1975  . KIDNEY SURGERY     Ruptured left kidney 30 yrs ago  from a kidney stone  . LITHOTRIPSY N/A 05/27/2018   Procedure: MECHANICAL LITHOTRIPSY;  Surgeon: Rogene Houston, MD;  [facility];  Service: Endoscopy;  Laterality: N/A;  . SPHINCTEROTOMY N/A 05/27/2018   Procedure: SPHINCTEROTOMY extended;  Surgeon: Rogene Houston, MD;  [facility];  Service: Endoscopy;  Laterality: N/A;  . SPYGLASS CHOLANGIOSCOPY N/A 05/27/2018   Procedure: VELFYBOF CHOLANGIOSCOPY;  Surgeon: Rogene Houston, MD;  [facility];  Service: Endoscopy;  Laterality: N/A;    Allergies  Allergen Reactions  . Bayer Advanced Aspirin [Aspirin] Nausea And Vomiting  . Penicillins Nausea And Vomiting    Has patient had a PCN reaction causing immediate rash, facial/tongue/throat swelling, SOB or lightheadedness with hypotension: unknown Has patient had a PCN reaction causing severe rash involving mucus membranes or skin necrosis: unknown Has patient had a PCN reaction that required hospitalization: unknown Has patient had a PCN reaction occurring within the last 10 years: no If all of the above answers are "NO", then may proceed with Cephalosporin use.    Current Outpatient Medications on File Prior to Visit  Medication Sig Dispense Refill  . amLODipine (NORVASC) 10 MG tablet TAKE 1 TABLET BY MOUTH ONCE DAILY 90 tablet 1  . aspirin EC 81 MG tablet Take 1 tablet (81 mg total) by mouth daily. 150 tablet 2  . calcium carbonate (TUMS - DOSED IN MG ELEMENTAL CALCIUM) 500 MG chewable tablet Chew 1 tablet by mouth 2 (two) times daily. For Bone Health    . clopidogrel (PLAVIX) 75 MG tablet TAKE 1 TABLET BY MOUTH ONCE DAILY 30 tablet 1  . Insulin Isophane & Regular Human (NOVOLIN 70/30 FLEXPEN RELION) (70-30) 100 UNIT/ML PEN Inject 10 Units into the skin 2 (two) times daily before a meal. 5 pen 2  . levETIRAcetam (KEPPRA) 250 MG tablet Take 250 mg by  mouth at bedtime. Taking 1/2 tablet    . levothyroxine (SYNTHROID, LEVOTHROID) 112 MCG tablet Take 1 tablet (112 mcg total) by mouth daily before breakfast. 90 tablet 3  . Multiple Vitamins-Minerals (MULTIVITAMIN WITH MINERALS) tablet Take 1 tablet by mouth daily.    Marland Kitchen olopatadine (PATANOL) 0.1 % ophthalmic solution INSTILL 1 DROP INTO EACH EYE TWICE DAILY 5 mL 12  . ONE TOUCH ULTRA TEST test strip USE 1 STRIP TO CHECK GLUCOSE TWICE DAILY 100 each 5  . ONETOUCH DELICA LANCETS 75Z MISC     . pravastatin (PRAVACHOL) 20 MG tablet Take 1 tablet (20 mg total) by mouth daily. 90 tablet 3  . tamsulosin (FLOMAX) 0.4 MG CAPS capsule Take 0.4 mg by mouth daily.     . Vitamin D, Ergocalciferol, (DRISDOL) 50000 units CAPS capsule TAKE 1 CAPSULE BY MOUTH ONCE A WEEK 12 capsule 1   No current facility-administered medications  on file prior to visit.         Objective:   Physical Exam Blood pressure 136/80, pulse 64, height '5\' 8"'  (1.727 m). Alert and oriented. Skin warm and dry. Oral mucosa is moist.   . Sclera anicteric, conjunctivae is pink. Thyroid not enlarged. No cervical lymphadenopathy. Lungs clear. Heart regular rate and rhythm.  Abdomen is soft. Bowel sounds are positive. No hepatomegaly. No abdominal masses felt. No tenderness.  No leakages noted to abdomen. No edema to lower extremities.          Assessment & Plan:  CBD stone. Will have repeat ERCP 1st week in October.

## 2018-08-24 ENCOUNTER — Encounter (INDEPENDENT_AMBULATORY_CARE_PROVIDER_SITE_OTHER): Payer: Self-pay | Admitting: *Deleted

## 2018-08-24 ENCOUNTER — Telehealth: Payer: Self-pay | Admitting: Family Medicine

## 2018-08-24 NOTE — Telephone Encounter (Signed)
PLEASE SEE MESSAGES BELOW REGARDING PATIENT

## 2018-08-24 NOTE — Telephone Encounter (Signed)
I don't have a callback number (the number I tried isn't working)  Can you let Lelon Frohlich know this is fine for patient to do?

## 2018-08-24 NOTE — Telephone Encounter (Signed)
Patient is aware 

## 2018-08-24 NOTE — Telephone Encounter (Signed)
P;ls let her know this is okay to do so he can have his stent removed,

## 2018-08-24 NOTE — Telephone Encounter (Signed)
Ann from [facility] office called:  Patient is have ERCP w/ stent removal 09/16/18.  Needs to stop plavix 5 days prior to procedure  And needs to stop aspirin 2 days prior to procedure.

## 2018-08-30 ENCOUNTER — Ambulatory Visit (INDEPENDENT_AMBULATORY_CARE_PROVIDER_SITE_OTHER): Payer: Medicare Other | Admitting: Internal Medicine

## 2018-09-02 DIAGNOSIS — D509 Iron deficiency anemia, unspecified: Secondary | ICD-10-CM | POA: Diagnosis not present

## 2018-09-02 DIAGNOSIS — Z79899 Other long term (current) drug therapy: Secondary | ICD-10-CM | POA: Diagnosis not present

## 2018-09-02 DIAGNOSIS — R809 Proteinuria, unspecified: Secondary | ICD-10-CM | POA: Diagnosis not present

## 2018-09-02 DIAGNOSIS — E559 Vitamin D deficiency, unspecified: Secondary | ICD-10-CM | POA: Diagnosis not present

## 2018-09-02 DIAGNOSIS — N183 Chronic kidney disease, stage 3 (moderate): Secondary | ICD-10-CM | POA: Diagnosis not present

## 2018-09-02 DIAGNOSIS — I129 Hypertensive chronic kidney disease with stage 1 through stage 4 chronic kidney disease, or unspecified chronic kidney disease: Secondary | ICD-10-CM | POA: Diagnosis not present

## 2018-09-05 ENCOUNTER — Other Ambulatory Visit: Payer: Self-pay | Admitting: Family Medicine

## 2018-09-06 DIAGNOSIS — E1129 Type 2 diabetes mellitus with other diabetic kidney complication: Secondary | ICD-10-CM | POA: Diagnosis not present

## 2018-09-06 DIAGNOSIS — R809 Proteinuria, unspecified: Secondary | ICD-10-CM | POA: Diagnosis not present

## 2018-09-06 DIAGNOSIS — E875 Hyperkalemia: Secondary | ICD-10-CM | POA: Diagnosis not present

## 2018-09-06 DIAGNOSIS — D638 Anemia in other chronic diseases classified elsewhere: Secondary | ICD-10-CM | POA: Diagnosis not present

## 2018-09-06 DIAGNOSIS — N184 Chronic kidney disease, stage 4 (severe): Secondary | ICD-10-CM | POA: Diagnosis not present

## 2018-09-08 NOTE — Patient Instructions (Signed)
Your procedure is scheduled on: 09/16/2018  Report to [facility] at   8:00  AM.  Call this number if you have problems the morning of surgery: [phone]   Remember:   Do not drink or eat food:After Midnight.  :  Take these medicines the morning of surgery with A SIP OF WATER: Amlodipine and Synthroid   Do not wear jewelry, make-up or nail polish.  Do not wear lotions, powders, or perfumes. You may wear deodorant.  Do not shave 48 hours prior to surgery. Men may shave face and neck.  Do not bring valuables to the hospital.  Contacts, dentures or bridgework may not be worn into surgery.  Leave suitcase in the car. After surgery it may be brought to your room.  For patients admitted to the hospital, checkout time is 11:00 AM the day of discharge.   Patients discharged the day of surgery will not be allowed to drive home.     Endoscopic Retrograde Cholangiopancreatogram Endoscopic retrograde cholangiopancreatogram (ERCP) is a procedure that may be used to diagnose or treat problems with the pancreas, bile ducts, liver, and gallbladder. For this procedure, a thin, lighted tube (endoscope) is passed through the mouth, the throat, and down into the areas being checked. The endoscope has a camera that allows the areas to be viewed. Dye is injected and then X-rays are taken to further study the areas. During ERCP, other procedures may also be done to help diagnose or treat problems that are found. For example, stones can be removed, or a tissue sample can be taken out for testing (biopsy). Tell a health care provider about:  Any allergies you have.  All medicines you are taking, including vitamins, herbs, eye drops, creams, and over-the-counter medicines.  Any problems you or family members have had with anesthetic medicines.  Any blood disorders you have.  Any surgeries you have had.  Any medical conditions you have.  Whether you are pregnant or may be pregnant. What are the  risks? Generally, this is a safe procedure. However, problems may occur, including:  Pancreatitis.  Infection.  Bleeding.  Allergic reactions to medicines or dyes.  Accidental punctures in the bowel wall, pancreas, or gallbladder.  Damage to other structures or organs.  What happens before the procedure? Staying hydrated Follow instructions from your health care provider about hydration, which may include:  Up to 2 hours before the procedure - you may continue to drink clear liquids, such as water, clear fruit juice, black coffee, and plain tea.  Eating and drinking restrictions Follow instructions from your health care provider about eating and drinking, which may include:  8 hours before the procedure - stop eating heavy meals or foods such as meat, fried foods, or fatty foods.  6 hours before the procedure - stop eating light meals or foods, such as toast or cereal.  6 hours before the procedure - stop drinking milk or drinks that contain milk.  2 hours before the procedure - stop drinking clear liquids.  General instructions  Ask your health care provider about: ? Changing or stopping your regular medicines. This is especially important if you are taking diabetes medicines or blood thinners. ? Taking medicines such as aspirin and ibuprofen. These medicines can thin your blood. Do not take these medicines before your procedure if your health care provider instructs you not to.  Plan to have someone take you home from the hospital or clinic.  If you will be going home right after  the procedure, plan to have someone with you for 24 hours. What happens during the procedure?  To lower your risk of infection, your health care team will wash or sanitize their hands.  An IV tube will be inserted into one of your veins.  You will be given one or more of the following: ? A medicine to help you relax (sedative). ? A medicine to numb the throat area (local anesthetic) and  prevent gagging. Your throat may be sprayed with this medicine, or you may gargle the medicine. ? A medicine to make you fall asleep (general anesthetic). ? A medicine to lower your risk of infection (antibiotic), inflammation (anti-inflammatory), or both.  You will lie on your left side.  The endoscope will be inserted through your mouth, down the back of the throat, and into the first part of the small intestine (duodenum).  Then a small, plastic tube (cannula) will be passed through the endoscope and directed into the bile duct or pancreatic duct.  Dye will be injected through the cannula to make structures easier to see on an X-ray.  X-rays will be taken to study the biliary and pancreatic passageways. You may be positioned on your abdomen or your back during the X-rays.  A small sample of tissue (biopsy) may be removed for examination, or other procedures may be done to fix problems that are found. The procedure may vary among health care providers and hospitals. What happens after the procedure?  Your blood pressure, heart rate, breathing rate, and blood oxygen level will be monitored until the medicines you were given have worn off.  Your throat may feel slightly sore.  You will not be allowed to eat or drink until numbness subsides.  Do not drive for 24 hours if you were given a sedative. Summary  Endoscopic retrograde cholangiopancreatogram is a procedure that may be used to diagnose or treat problems with the pancreas, bile ducts, liver, and gallbladder.  During ERCP, other procedures may also be done to help diagnose or treat problems that are found. For example, stones can be removed, or a tissue sample can be taken out for testing (biopsy).  Generally, this is a safe procedure. However, problems may occur, including infection, bleeding, pancreatitis, accidental damage to other structures or organs, and allergic reactions to medicines or dyes.  The procedure may vary  among health care providers and hospitals. This information is not intended to replace advice given to you by your health care provider. Make sure you discuss any questions you have with your health care provider. Document Released: 08/18/2001 Document Revised: 10/19/2016 Document Reviewed: 10/19/2016 Elsevier Interactive Patient Education  2017 [place] Anesthesia, Adult, Care After These instructions provide you with information about caring for yourself after your procedure. Your health care provider may also give you more specific instructions. Your treatment has been planned according to current medical practices, but problems sometimes occur. Call your health care provider if you have any problems or questions after your procedure. What can I expect after the procedure? After the procedure, it is common to have:  Vomiting.  A sore throat.  Mental slowness.  It is common to feel:  Nauseous.  Cold or shivery.  Sleepy.  Tired.  Sore or achy, even in parts of your body where you did not have surgery.  Follow these instructions at home: For at least 24 hours after the procedure:  Do not: ? Participate in activities where you could fall or become injured. ? Drive. ?  Use heavy machinery. ? Drink alcohol. ? Take sleeping pills or medicines that cause drowsiness. ? Make important decisions or sign legal documents. ? Take care of children on your own.  Rest. Eating and drinking  If you vomit, drink water, juice, or soup when you can drink without vomiting.  Drink enough fluid to keep your urine clear or pale yellow.  Make sure you have little or no nausea before eating solid foods.  Follow the diet recommended by your health care provider. General instructions  Have a responsible adult stay with you until you are awake and alert.  Return to your normal activities as told by your health care provider. Ask your health care provider what activities are safe  for you.  Take over-the-counter and prescription medicines only as told by your health care provider.  If you smoke, do not smoke without supervision.  Keep all follow-up visits as told by your health care provider. This is important. Contact a health care provider if:  You continue to have nausea or vomiting at home, and medicines are not helpful.  You cannot drink fluids or start eating again.  You cannot urinate after 8-12 hours.  You develop a skin rash.  You have fever.  You have increasing redness at the site of your procedure. Get help right away if:  You have difficulty breathing.  You have chest pain.  You have unexpected bleeding.  You feel that you are having a life-threatening or urgent problem. This information is not intended to replace advice given to you by your health care provider. Make sure you discuss any questions you have with your health care provider. Document Released: 03/01/2001 Document Revised: 04/27/2016 Document Reviewed: 11/07/2015 Elsevier Interactive Patient Education  [employer].

## 2018-09-13 ENCOUNTER — Other Ambulatory Visit: Payer: Self-pay | Admitting: Family Medicine

## 2018-09-13 ENCOUNTER — Encounter (HOSPITAL_COMMUNITY)
Admission: RE | Admit: 2018-09-13 | Discharge: 2018-09-13 | Disposition: A | Discharge status: Home / Self Care | Payer: Medicare Other | Source: Ambulatory Visit | Attending: Internal Medicine | Admitting: Internal Medicine

## 2018-09-14 ENCOUNTER — Other Ambulatory Visit: Payer: Self-pay

## 2018-09-14 ENCOUNTER — Encounter (HOSPITAL_COMMUNITY): Payer: Self-pay

## 2018-09-14 ENCOUNTER — Encounter (HOSPITAL_COMMUNITY)
Admission: RE | Admit: 2018-09-14 | Discharge: 2018-09-14 | Disposition: A | Discharge status: Home / Self Care | Payer: Medicare Other | Source: Ambulatory Visit | Attending: Internal Medicine | Admitting: Internal Medicine

## 2018-09-14 DIAGNOSIS — K838 Other specified diseases of biliary tract: Secondary | ICD-10-CM | POA: Diagnosis not present

## 2018-09-14 DIAGNOSIS — F329 Major depressive disorder, single episode, unspecified: Secondary | ICD-10-CM | POA: Diagnosis not present

## 2018-09-14 DIAGNOSIS — K8050 Calculus of bile duct without cholangitis or cholecystitis without obstruction: Secondary | ICD-10-CM | POA: Diagnosis not present

## 2018-09-14 DIAGNOSIS — Z01818 Encounter for other preprocedural examination: Secondary | ICD-10-CM

## 2018-09-14 DIAGNOSIS — N184 Chronic kidney disease, stage 4 (severe): Secondary | ICD-10-CM | POA: Diagnosis not present

## 2018-09-14 DIAGNOSIS — E1122 Type 2 diabetes mellitus with diabetic chronic kidney disease: Secondary | ICD-10-CM | POA: Diagnosis not present

## 2018-09-14 DIAGNOSIS — Z7982 Long term (current) use of aspirin: Secondary | ICD-10-CM | POA: Diagnosis not present

## 2018-09-14 DIAGNOSIS — Z79899 Other long term (current) drug therapy: Secondary | ICD-10-CM | POA: Diagnosis not present

## 2018-09-14 DIAGNOSIS — I69354 Hemiplegia and hemiparesis following cerebral infarction affecting left non-dominant side: Secondary | ICD-10-CM | POA: Diagnosis not present

## 2018-09-14 DIAGNOSIS — E1151 Type 2 diabetes mellitus with diabetic peripheral angiopathy without gangrene: Secondary | ICD-10-CM | POA: Diagnosis not present

## 2018-09-14 DIAGNOSIS — Z794 Long term (current) use of insulin: Secondary | ICD-10-CM | POA: Diagnosis not present

## 2018-09-14 DIAGNOSIS — I1310 Hypertensive heart and chronic kidney disease without heart failure, with stage 1 through stage 4 chronic kidney disease, or unspecified chronic kidney disease: Secondary | ICD-10-CM | POA: Diagnosis not present

## 2018-09-14 DIAGNOSIS — Z4659 Encounter for fitting and adjustment of other gastrointestinal appliance and device: Secondary | ICD-10-CM | POA: Diagnosis not present

## 2018-09-14 DIAGNOSIS — I4891 Unspecified atrial fibrillation: Secondary | ICD-10-CM | POA: Diagnosis not present

## 2018-09-14 DIAGNOSIS — M1990 Unspecified osteoarthritis, unspecified site: Secondary | ICD-10-CM | POA: Diagnosis not present

## 2018-09-14 DIAGNOSIS — Z7902 Long term (current) use of antithrombotics/antiplatelets: Secondary | ICD-10-CM | POA: Diagnosis not present

## 2018-09-14 DIAGNOSIS — E785 Hyperlipidemia, unspecified: Secondary | ICD-10-CM | POA: Diagnosis not present

## 2018-09-14 DIAGNOSIS — R569 Unspecified convulsions: Secondary | ICD-10-CM | POA: Diagnosis not present

## 2018-09-14 DIAGNOSIS — Z886 Allergy status to analgesic agent status: Secondary | ICD-10-CM | POA: Diagnosis not present

## 2018-09-14 DIAGNOSIS — Z880 Allergy status to penicillin: Secondary | ICD-10-CM | POA: Diagnosis not present

## 2018-09-14 LAB — BASIC METABOLIC PANEL
Anion gap: 6 (ref 5–15)
BUN: 43 mg/dL — ABNORMAL HIGH (ref 8–23)
CO2: 23 mmol/L (ref 22–32)
Calcium: 9.3 mg/dL (ref 8.9–10.3)
Chloride: 111 mmol/L (ref 98–111)
Creatinine, Ser: 3.65 mg/dL — ABNORMAL HIGH (ref 0.61–1.24)
GFR calc Af Amer: 16 mL/min — ABNORMAL LOW (ref >60)
GFR calc non Af Amer: 14 mL/min — ABNORMAL LOW (ref >60)
Glucose, Bld: 187 mg/dL — ABNORMAL HIGH (ref 70–99)
Potassium: 4.6 mmol/L (ref 3.5–5.1)
Sodium: 140 mmol/L (ref 135–145)

## 2018-09-14 LAB — CBC
HCT: 33.9 % — ABNORMAL LOW (ref 39.0–52.0)
Hemoglobin: 10.5 g/dL — ABNORMAL LOW (ref 13.0–17.0)
MCH: 30.6 pg (ref 26.0–34.0)
MCHC: 31 g/dL (ref 30.0–36.0)
MCV: 98.8 fL (ref 80.0–100.0)
Platelets: 237 10*3/uL (ref 150–400)
RBC: 3.43 MIL/uL — ABNORMAL LOW (ref 4.22–5.81)
RDW: 13.9 % (ref 11.5–15.5)
WBC: 5.9 10*3/uL (ref 4.0–10.5)
nRBC: 0 % (ref 0.0–0.2)

## 2018-09-14 NOTE — Pre-Procedure Instructions (Signed)
Patient in for PAT. His las EKG done in March 2019 showed a NSR with Bifasicular block. When hooked to monitor, HR ranged from 36 to 102. EKG done and EKG now shows AFib with bifasicular block. BP 130/80. Patient denies SOB, or any other symptoms at this time. Dr Laural Golden consulted and he along with anesthesia feel patient need cardiac clearance prior to ERCP. Called [facility] and spoke with Jarrett Soho and appointment for 0820 in the morning in the [place] office. Spoke with wife and daughter along with patient about importance of going to this appointment to make sure his ERCP was still going to be done on 09/16/2018. They all verbalize understanding of this. We will call wife tomorrow around 1300 to see what cardiology says concerning continuing with procedure on Friday, 09/16/2018.

## 2018-09-15 ENCOUNTER — Encounter: Payer: Self-pay | Admitting: Cardiovascular Disease

## 2018-09-15 ENCOUNTER — Ambulatory Visit (INDEPENDENT_AMBULATORY_CARE_PROVIDER_SITE_OTHER): Payer: Medicare Other | Admitting: Cardiovascular Disease

## 2018-09-15 VITALS — BP 165/78 | HR 76 | Ht 68.0 in | Wt 174.2 lb

## 2018-09-15 DIAGNOSIS — I10 Essential (primary) hypertension: Secondary | ICD-10-CM

## 2018-09-15 DIAGNOSIS — Z8673 Personal history of transient ischemic attack (TIA), and cerebral infarction without residual deficits: Secondary | ICD-10-CM | POA: Diagnosis not present

## 2018-09-15 DIAGNOSIS — E785 Hyperlipidemia, unspecified: Secondary | ICD-10-CM

## 2018-09-15 DIAGNOSIS — N184 Chronic kidney disease, stage 4 (severe): Secondary | ICD-10-CM

## 2018-09-15 DIAGNOSIS — I452 Bifascicular block: Secondary | ICD-10-CM | POA: Diagnosis not present

## 2018-09-15 NOTE — Patient Instructions (Signed)
Medication Instructions:  Continue all current medications.  Labwork: none  Testing/Procedures: none  Follow-Up: As needed.    Any Other Special Instructions Will Be Listed Below (If Applicable).  If you need a refill on your cardiac medications before your next appointment, please call your pharmacy.  

## 2018-09-15 NOTE — Progress Notes (Signed)
SUBJECTIVE: The patient is an 84 year old male whom I last evaluated in December 2016.  He has a history of hypertension, type 2 diabetes mellitus, common bile duct stone and biliary stent status post removal, left retinal arterial cholesterol embolus, hypercholesterolemia, chronic kidney disease stage IV, and CVA.  He also has a history of right bundle branch block and left anterior fascicular block noted on prior ECGs.  I reviewed an ECG performed yesterday for which the computer-generated interpretation said "atrial fibrillation ".  However upon personal review, there may be subtle P waves indicative of sinus rhythm with PACs and bifascicular block.  ECG performed in the office today which I ordered and personally interpreted demonstrates normal sinus rhythm with first-degree AV block, right bundle branch block, left anterior fascicular block, with possible old inferior and anterolateral infarct pattern.  Echocardiogram performed in December 2015 demonstrated normal left ventricular systolic function and regional wall motion, LVEF 55 to 60%, moderate LVH with grade 1 diastolic dysfunction, and mild mitral regurgitation.  He is here with his wife and daughter.  The patient denies chest pain, palpitations, shortness of breath, leg swelling, orthopnea, and paroxysmal nocturnal dyspnea.  He is scheduled to undergo another GI procedure tomorrow.  He did well with the last one.     Review of Systems: As per "subjective", otherwise negative.  Allergies  Allergen Reactions  . Bayer Advanced Aspirin [Aspirin] Nausea And Vomiting  . Penicillins Nausea And Vomiting    Has patient had a PCN reaction causing immediate rash, facial/tongue/throat swelling, SOB or lightheadedness with hypotension: unknown Has patient had a PCN reaction causing severe rash involving mucus membranes or skin necrosis: unknown Has patient had a PCN reaction that required hospitalization: unknown Has patient had  a PCN reaction occurring within the last 10 years: no If all of the above answers are "NO", then may proceed with Cephalosporin use.    Current Outpatient Medications  Medication Sig Dispense Refill  . amLODipine (NORVASC) 10 MG tablet TAKE 1 TABLET BY MOUTH ONCE DAILY 90 tablet 0  . aspirin EC 81 MG tablet Take 1 tablet (81 mg total) by mouth daily. 150 tablet 2  . calcium carbonate (TUMS - DOSED IN MG ELEMENTAL CALCIUM) 500 MG chewable tablet Chew 1 tablet by mouth 2 (two) times daily. For Bone Health    . clopidogrel (PLAVIX) 75 MG tablet TAKE 1 TABLET BY MOUTH ONCE DAILY (Patient taking differently: Take 75 mg by mouth daily. ) 30 tablet 1  . Insulin Isophane & Regular Human (NOVOLIN 70/30 FLEXPEN RELION) (70-30) 100 UNIT/ML PEN Inject 10 Units into the skin 2 (two) times daily before a meal. 5 pen 2  . levETIRAcetam (KEPPRA) 500 MG tablet Take 500 mg by mouth at bedtime.    Marland Kitchen levothyroxine (SYNTHROID, LEVOTHROID) 112 MCG tablet Take 1 tablet (112 mcg total) by mouth daily before breakfast. 90 tablet 3  . Multiple Vitamins-Minerals (MULTIVITAMIN WITH MINERALS) tablet Take 1 tablet by mouth daily.    Marland Kitchen olopatadine (PATANOL) 0.1 % ophthalmic solution INSTILL 1 DROP INTO EACH EYE TWICE DAILY (Patient taking differently: Place 1 drop into both eyes 2 (two) times daily. ) 5 mL 12  . ONE TOUCH ULTRA TEST test strip USE 1 STRIP TO CHECK GLUCOSE TWICE DAILY 100 each 5  . ONETOUCH DELICA LANCETS 21Y MISC     . pravastatin (PRAVACHOL) 20 MG tablet Take 1 tablet (20 mg total) by mouth daily. 90 tablet 3  . tamsulosin (FLOMAX) 0.4  MG CAPS capsule Take 0.4 mg by mouth daily.     . Vitamin D, Ergocalciferol, (DRISDOL) 50000 units CAPS capsule TAKE 1 CAPSULE BY MOUTH ONCE A WEEK (Patient taking differently: Take 50,000 Units by mouth every 7 (seven) days. On Sunday) 12 capsule 1   No current facility-administered medications for this visit.     Past Medical History:  Diagnosis Date  . Bradycardia  03/15/2012  . Carotid artery occlusion   . Choledocholithiasis 02/25/2018  . CKD (chronic kidney disease) stage 4, GFR 15-29 ml/min (HCC)   . Complete lesion of cervical spinal cord ([place]) 03/14/3012   Stable since 2006  . CVA (cerebrovascular accident) ([place]) 09/07/12   right sided weakness  . Depressive disorder, not elsewhere classified   . Diabetes mellitus approx 1994  . Diabetic neuropathy ([place])   . History of kidney stones   . Hypertensive heart disease   . Hypothyroidism approx 2000  . Lacunar stroke, acute ([place]) 03/14/2012  . Obesity   . Osteoarthrosis, unspecified whether generalized or localized, lower leg   . Other and unspecified hyperlipidemia   . Peripheral vascular disease, unspecified ([place])   . Seizures ([place])    unknown etiology; on meds, last seizure was 2015  . Spinal stenosis, unspecified region other than cervical   . Spondylosis of unspecified site without mention of myelopathy     Past Surgical History:  Procedure Laterality Date  . BILIARY STENT PLACEMENT N/A 05/27/2018   Procedure: BILIARY STENT PLACEMENT;  Surgeon: Rogene Houston, MD;  [facility];  Service: Endoscopy;  Laterality: N/A;  . COLONOSCOPY N/A 08/10/2013   Procedure: COLONOSCOPY;  Surgeon: Rogene Houston, MD;  [facility];  Service: Endoscopy;  Laterality: N/A;  240  . ERCP N/A 03/02/2018   Procedure: ENDOSCOPIC RETROGRADE CHOLANGIOPANCREATOGRAPHY (ERCP) With sphincterotomy and stent placement;  Surgeon: Rogene Houston, MD;  [facility];  Service: Gastroenterology;  Laterality: N/A;  . ERCP N/A 05/27/2018   Procedure: ENDOSCOPIC RETROGRADE CHOLANGIOPANCREATOGRAPHY (ERCP);  Surgeon: Rogene Houston, MD;  [facility];  Service: Endoscopy;  Laterality: N/A;  . GASTROINTESTINAL STENT REMOVAL N/A 05/27/2018   Procedure: Biliary STENT REMOVAL;  Surgeon: Rogene Houston, MD;  [facility];  Service: Endoscopy;  Laterality: N/A;  . kidney stones left  x2  1975  . KIDNEY SURGERY     Ruptured left kidney 30 yrs ago  from a kidney stone  . LITHOTRIPSY N/A 05/27/2018   Procedure: MECHANICAL LITHOTRIPSY;  Surgeon: Rogene Houston, MD;  [facility];  Service: Endoscopy;  Laterality: N/A;  . SPHINCTEROTOMY N/A 05/27/2018   Procedure: SPHINCTEROTOMY extended;  Surgeon: Rogene Houston, MD;  [facility];  Service: Endoscopy;  Laterality: N/A;  . SPYGLASS CHOLANGIOSCOPY N/A 05/27/2018   Procedure: [id number] CHOLANGIOSCOPY;  Surgeon: Rogene Houston, MD;  [facility];  Service: Endoscopy;  Laterality: N/A;    Social History   Socioeconomic History  . Marital status: Married    Spouse name: Not on file  . Number of children: 5  . Years of education: Not on file  . Highest education level: Not on file  Occupational History  . Occupation: retired     Fish farm manager: RETIRED  Social Needs  . Financial resource strain: Not on file  . Food insecurity:    Worry: Not on file    Inability: Not on file  . Transportation needs:    Medical: Not on file  Non-medical: Not on file  Tobacco Use  . Smoking status: Never Smoker  . Smokeless tobacco: Never Used  Substance and Sexual Activity  . Alcohol use: No    Alcohol/week: 0.0 standard drinks  . Drug use: No  . Sexual activity: Never  Lifestyle  . Physical activity:    Days per week: Not on file    Minutes per session: Not on file  . Stress: Not on file  Relationships  . Social connections:    Talks on phone: Not on file    Gets together: Not on file    Attends religious service: Not on file    Active member of club or organization: Not on file    Attends meetings of clubs or organizations: Not on file    Relationship status: Not on file  . Intimate partner violence:    Fear of current or ex partner: Not on file    Emotionally abused: Not on file    Physically abused: Not on file    Forced sexual activity: Not on file  Other Topics Concern  . Not on  file  Social History Narrative  . Not on file     Vitals:   09/15/18 0820  BP: (!) 165/78  Pulse: 76  SpO2: 99%  Weight: 174 lb 3.2 oz (79 kg)  Height: 5\' 8"  (1.727 m)    Wt Readings from Last 3 Encounters:  09/15/18 174 lb 3.2 oz (79 kg)  09/14/18 180 lb (81.6 kg)  08/23/18 172 lb 11.2 oz (78.3 kg)     PHYSICAL EXAM General: NAD, chronically ill-appearing HEENT: Normal. Neck: No JVD, no thyromegaly. Lungs: Clear to auscultation bilaterally with normal respiratory effort. CV: Regular rate and rhythm, normal S1/S2, no S3/S4, no murmur. No pretibial or periankle edema.   Abdomen: Soft, no distention.  Neurologic: Alert.  Psych: Normal affect. Skin: Normal. Musculoskeletal: No gross deformities.    ECG: Reviewed above under Subjective   Labs: Lab Results  Component Value Date/Time   K 4.6 09/14/2018 01:01 PM   BUN 43 (H) 09/14/2018 01:01 PM   CREATININE 3.65 (H) 09/14/2018 01:01 PM   CREATININE 3.56 (H) 06/02/2018 01:38 PM   ALT 43 06/02/2018 01:38 PM   TSH 0.82 06/02/2018 01:38 PM   HGB 10.5 (L) 09/14/2018 01:01 PM     Lipids: Lab Results  Component Value Date/Time   LDLCALC 126 (H) 06/02/2018 01:38 PM   CHOL 212 (H) 06/02/2018 01:38 PM   TRIG 93 06/02/2018 01:38 PM   HDL 67 06/02/2018 01:38 PM       ASSESSMENT AND PLAN: 1.  Abnormal EKG: As stated above, he has a prior history of right bundle branch block and left anterior fascicular block.  ECG performed today in our office demonstrates that his rhythm is actually sinus.  Yesterday's automated ECG interpretation was incorrect in that he does not have atrial fibrillation.  It showed sinus rhythm with PACs.  He is asymptomatic from a cardiac perspective.  No cardiac testing is indicated at this time.  He should be fine to undergo a GI procedure tomorrow as he did well with his most recent one earlier this year.  2.  Hypertension: Blood pressure is elevated.  This will need further monitoring.  3.   Hyperlipidemia: On pravastatin 20 mg.  4.  History of CVA: On aspirin, Plavix, and pravastatin.   Disposition: Follow up as needed   Kate Sable, M.D., F.A.C.C.

## 2018-09-16 ENCOUNTER — Encounter (HOSPITAL_COMMUNITY): Payer: Self-pay | Admitting: *Deleted

## 2018-09-16 ENCOUNTER — Ambulatory Visit (HOSPITAL_COMMUNITY)
Admission: RE | Admit: 2018-09-16 | Discharge: 2018-09-16 | Disposition: A | Discharge status: Home / Self Care | Payer: Medicare Other | Source: Ambulatory Visit | Attending: Internal Medicine | Admitting: Internal Medicine

## 2018-09-16 ENCOUNTER — Ambulatory Visit (HOSPITAL_COMMUNITY): Payer: Medicare Other

## 2018-09-16 ENCOUNTER — Encounter (HOSPITAL_COMMUNITY)
Admission: RE | Disposition: A | Discharge status: Home / Self Care | Payer: Self-pay | Source: Ambulatory Visit | Attending: Internal Medicine

## 2018-09-16 ENCOUNTER — Ambulatory Visit (HOSPITAL_COMMUNITY): Payer: Medicare Other | Admitting: Anesthesiology

## 2018-09-16 DIAGNOSIS — I69354 Hemiplegia and hemiparesis following cerebral infarction affecting left non-dominant side: Secondary | ICD-10-CM | POA: Insufficient documentation

## 2018-09-16 DIAGNOSIS — K838 Other specified diseases of biliary tract: Secondary | ICD-10-CM | POA: Diagnosis not present

## 2018-09-16 DIAGNOSIS — I1310 Hypertensive heart and chronic kidney disease without heart failure, with stage 1 through stage 4 chronic kidney disease, or unspecified chronic kidney disease: Secondary | ICD-10-CM | POA: Diagnosis not present

## 2018-09-16 DIAGNOSIS — N184 Chronic kidney disease, stage 4 (severe): Secondary | ICD-10-CM | POA: Diagnosis not present

## 2018-09-16 DIAGNOSIS — E1151 Type 2 diabetes mellitus with diabetic peripheral angiopathy without gangrene: Secondary | ICD-10-CM | POA: Diagnosis not present

## 2018-09-16 DIAGNOSIS — E1122 Type 2 diabetes mellitus with diabetic chronic kidney disease: Secondary | ICD-10-CM | POA: Diagnosis not present

## 2018-09-16 DIAGNOSIS — R569 Unspecified convulsions: Secondary | ICD-10-CM | POA: Diagnosis not present

## 2018-09-16 DIAGNOSIS — F329 Major depressive disorder, single episode, unspecified: Secondary | ICD-10-CM | POA: Diagnosis not present

## 2018-09-16 DIAGNOSIS — Z4659 Encounter for fitting and adjustment of other gastrointestinal appliance and device: Secondary | ICD-10-CM | POA: Insufficient documentation

## 2018-09-16 DIAGNOSIS — Z7982 Long term (current) use of aspirin: Secondary | ICD-10-CM | POA: Insufficient documentation

## 2018-09-16 DIAGNOSIS — I4891 Unspecified atrial fibrillation: Secondary | ICD-10-CM | POA: Diagnosis not present

## 2018-09-16 DIAGNOSIS — Z886 Allergy status to analgesic agent status: Secondary | ICD-10-CM | POA: Insufficient documentation

## 2018-09-16 DIAGNOSIS — I10 Essential (primary) hypertension: Secondary | ICD-10-CM | POA: Diagnosis not present

## 2018-09-16 DIAGNOSIS — E119 Type 2 diabetes mellitus without complications: Secondary | ICD-10-CM | POA: Diagnosis not present

## 2018-09-16 DIAGNOSIS — M1990 Unspecified osteoarthritis, unspecified site: Secondary | ICD-10-CM | POA: Diagnosis not present

## 2018-09-16 DIAGNOSIS — Z7902 Long term (current) use of antithrombotics/antiplatelets: Secondary | ICD-10-CM | POA: Insufficient documentation

## 2018-09-16 DIAGNOSIS — Z794 Long term (current) use of insulin: Secondary | ICD-10-CM | POA: Insufficient documentation

## 2018-09-16 DIAGNOSIS — Z79899 Other long term (current) drug therapy: Secondary | ICD-10-CM | POA: Insufficient documentation

## 2018-09-16 DIAGNOSIS — I739 Peripheral vascular disease, unspecified: Secondary | ICD-10-CM | POA: Diagnosis not present

## 2018-09-16 DIAGNOSIS — E785 Hyperlipidemia, unspecified: Secondary | ICD-10-CM | POA: Insufficient documentation

## 2018-09-16 DIAGNOSIS — K8050 Calculus of bile duct without cholangitis or cholecystitis without obstruction: Secondary | ICD-10-CM | POA: Insufficient documentation

## 2018-09-16 DIAGNOSIS — K8020 Calculus of gallbladder without cholecystitis without obstruction: Secondary | ICD-10-CM

## 2018-09-16 DIAGNOSIS — Z880 Allergy status to penicillin: Secondary | ICD-10-CM | POA: Insufficient documentation

## 2018-09-16 HISTORY — PX: GASTROINTESTINAL STENT REMOVAL: SHX6384

## 2018-09-16 HISTORY — PX: REMOVAL OF STONES: SHX5545

## 2018-09-16 HISTORY — PX: ERCP: SHX5425

## 2018-09-16 LAB — GLUCOSE, CAPILLARY
Glucose-Capillary: 94 mg/dL (ref 70–99)
Glucose-Capillary: 147 mg/dL — ABNORMAL HIGH (ref 70–99)

## 2018-09-16 SURGERY — ERCP, WITH INTERVENTION IF INDICATED
Anesthesia: General

## 2018-09-16 MED ORDER — LACTATED RINGERS IV SOLN
INTRAVENOUS | Status: DC
  Administered 2018-09-16: 09:00:00 via INTRAVENOUS

## 2018-09-16 MED ORDER — LIDOCAINE HCL 1 % IJ SOLN
INTRAMUSCULAR | Status: DC | PRN
  Administered 2018-09-16: 30 mg via INTRADERMAL

## 2018-09-16 MED ORDER — SIMETHICONE 40 MG/0.6ML PO SUSP
ORAL | Status: AC
  Filled 2018-09-16: qty 0.6

## 2018-09-16 MED ORDER — FENTANYL CITRATE (PF) 100 MCG/2ML IJ SOLN
INTRAMUSCULAR | Status: AC
  Filled 2018-09-16: qty 2

## 2018-09-16 MED ORDER — CHLORHEXIDINE GLUCONATE CLOTH 2 % EX PADS: 6.0000 | MEDICATED_PAD | Freq: Once | CUTANEOUS | Status: DC

## 2018-09-16 MED ORDER — ASPIRIN EC 81 MG PO TBEC: 81.0000 mg | DELAYED_RELEASE_TABLET | Freq: Every day | ORAL | 2 refills | Status: DC

## 2018-09-16 MED ORDER — SUCCINYLCHOLINE CHLORIDE 20 MG/ML IJ SOLN
INTRAMUSCULAR | Status: AC
  Filled 2018-09-16: qty 1

## 2018-09-16 MED ORDER — HYDROCODONE-ACETAMINOPHEN 7.5-325 MG PO TABS: 1.0000 | ORAL_TABLET | Freq: Once | ORAL | Status: DC | PRN

## 2018-09-16 MED ORDER — IOPAMIDOL (ISOVUE-M 300) INJECTION 61%
INTRAMUSCULAR | Status: DC | PRN
  Administered 2018-09-16: 40 mL

## 2018-09-16 MED ORDER — GLYCOPYRROLATE PF 0.2 MG/ML IJ SOSY
PREFILLED_SYRINGE | INTRAMUSCULAR | Status: DC | PRN
  Administered 2018-09-16: .2 mg via INTRAVENOUS

## 2018-09-16 MED ORDER — SODIUM CHLORIDE 0.9 % IJ SOLN
INTRAMUSCULAR | Status: AC
  Filled 2018-09-16: qty 10

## 2018-09-16 MED ORDER — LEVOFLOXACIN IN D5W 500 MG/100ML IV SOLN
500.0000 mg | Freq: Once | INTRAVENOUS | Status: DC
  Filled 2018-09-16: qty 100

## 2018-09-16 MED ORDER — PROPOFOL 10 MG/ML IV BOLUS
INTRAVENOUS | Status: AC
  Filled 2018-09-16: qty 40

## 2018-09-16 MED ORDER — GLYCOPYRROLATE 0.2 MG/ML IJ SOLN
INTRAMUSCULAR | Status: AC
  Filled 2018-09-16: qty 1

## 2018-09-16 MED ORDER — EPHEDRINE SULFATE 50 MG/ML IJ SOLN
INTRAMUSCULAR | Status: AC
  Filled 2018-09-16: qty 1

## 2018-09-16 MED ORDER — IOPAMIDOL (ISOVUE-300) INJECTION 61%
INTRAVENOUS | Status: AC
  Filled 2018-09-16: qty 50

## 2018-09-16 MED ORDER — LIDOCAINE HCL (PF) 1 % IJ SOLN
INTRAMUSCULAR | Status: AC
  Filled 2018-09-16: qty 5

## 2018-09-16 MED ORDER — ONDANSETRON HCL 4 MG/2ML IJ SOLN
INTRAMUSCULAR | Status: DC | PRN
  Administered 2018-09-16: 4 mg via INTRAVENOUS

## 2018-09-16 MED ORDER — SUGAMMADEX SODIUM 200 MG/2ML IV SOLN
INTRAVENOUS | Status: DC | PRN
  Administered 2018-09-16: 158 mg via INTRAVENOUS

## 2018-09-16 MED ORDER — GLUCAGON HCL RDNA (DIAGNOSTIC) 1 MG IJ SOLR
INTRAMUSCULAR | Status: AC
  Filled 2018-09-16: qty 2

## 2018-09-16 MED ORDER — SUCCINYLCHOLINE CHLORIDE 20 MG/ML IJ SOLN
INTRAMUSCULAR | Status: DC | PRN
  Administered 2018-09-16: 100 mg via INTRAVENOUS

## 2018-09-16 MED ORDER — GLUCAGON HCL RDNA (DIAGNOSTIC) 1 MG IJ SOLR
INTRAMUSCULAR | Status: DC | PRN
  Administered 2018-09-16: 0.25 mg via INTRAVENOUS

## 2018-09-16 MED ORDER — STERILE WATER FOR IRRIGATION IR SOLN
Status: DC | PRN
  Administered 2018-09-16: 1000 mL

## 2018-09-16 MED ORDER — ROCURONIUM BROMIDE 100 MG/10ML IV SOLN
INTRAVENOUS | Status: DC | PRN
  Administered 2018-09-16: 15 mg via INTRAVENOUS

## 2018-09-16 MED ORDER — HYDROMORPHONE HCL 1 MG/ML IJ SOLN: 0.2500 mg | INTRAMUSCULAR | Status: DC | PRN

## 2018-09-16 MED ORDER — PROMETHAZINE HCL 25 MG/ML IJ SOLN: 6.2500 mg | INTRAMUSCULAR | Status: DC | PRN

## 2018-09-16 MED ORDER — PROPOFOL 10 MG/ML IV BOLUS
INTRAVENOUS | Status: DC | PRN
  Administered 2018-09-16: 30 mg via INTRAVENOUS
  Administered 2018-09-16: 100 mg via INTRAVENOUS

## 2018-09-16 MED ORDER — SUGAMMADEX SODIUM 200 MG/2ML IV SOLN
INTRAVENOUS | Status: AC
  Filled 2018-09-16: qty 2

## 2018-09-16 MED ORDER — LEVOFLOXACIN IN D5W 500 MG/100ML IV SOLN
INTRAVENOUS | Status: DC | PRN
  Administered 2018-09-16: 500 mg via INTRAVENOUS

## 2018-09-16 MED ORDER — ROCURONIUM BROMIDE 50 MG/5ML IV SOLN
INTRAVENOUS | Status: AC
  Filled 2018-09-16: qty 1

## 2018-09-16 MED ORDER — MIDAZOLAM HCL 2 MG/2ML IJ SOLN: 0.5000 mg | Freq: Once | INTRAMUSCULAR | Status: DC | PRN

## 2018-09-16 MED ORDER — CEFAZOLIN SODIUM-DEXTROSE 2-4 GM/100ML-% IV SOLN: 2.0000 g | INTRAVENOUS | Status: DC

## 2018-09-16 MED ORDER — PHENYLEPHRINE 40 MCG/ML (10ML) SYRINGE FOR IV PUSH (FOR BLOOD PRESSURE SUPPORT)
PREFILLED_SYRINGE | INTRAVENOUS | Status: AC
  Filled 2018-09-16: qty 10

## 2018-09-16 NOTE — Discharge Instructions (Signed)
Resume aspirin on 09/17/2018 and clopidogrel on 09/18/2018. Resume other r medications as before.. Clear liquids today and then usual starting tomorrow morning. Office visit in 8 weeks.          Endoscopic Retrograde Cholangiopancreatogram, Care After This sheet gives you information about how to care for yourself after your procedure. Your health care provider may also give you more specific instructions. If you have problems or questions, contact your health care provider. What can I expect after the procedure? After the procedure, it is common to have:  Soreness in your throat.  Nausea.  Bloating.  Dizziness.  Tiredness (fatigue).  Follow these instructions at home:  Take over-the-counter and prescription medicines only as told by your health care provider.  Do not drive for 24 hours if you were given a medicine to help you relax (sedative) during your procedure. Have someone stay with you for 24 hours after the procedure.  Return to your normal activities as told by your health care provider. Ask your health care provider what activities are safe for you.  Return to eating what you normally do as soon as you feel well enough or as told by your health care provider.  Keep all follow-up visits as told by your health care provider. This is important. Contact a health care provider if:  You have pain in your abdomen that does not get better with medicine.  You develop signs of infection, such as: ? Chills. ? Feeling unwell. Get help right away if:  You have difficulty swallowing.  You have worsening pain in your throat, chest, or abdomen.  You vomit bright red blood or a substance that looks like coffee grounds.  You have bloody or very black stools.  You have a fever.  You have a sudden increase in swelling (bloating) in your abdomen. Summary  After the procedure, it is common to feel tired and to have some discomfort in your throat.  Contact your  health care provider if you have signs of infection--such as chills or feeling unwell--or if you have pain that does not improve with medicine.  Get help right away if you have trouble swallowing, worsening pain, bloody or black vomit, bloody or black stools, a fever, or increased swelling in your abdomen.  Keep all follow-up visits as told by your health care provider. This is important. This information is not intended to replace advice given to you by your health care provider. Make sure you discuss any questions you have with your health care provider. Document Released: 09/13/2013 Document Revised: 10/12/2016 Document Reviewed: 10/12/2016 Elsevier Interactive Patient Education  2017 [employer] Anesthesia, Adult, Care After These instructions provide you with information about caring for yourself after your procedure. Your health care provider may also give you more specific instructions. Your treatment has been planned according to current medical practices, but problems sometimes occur. Call your health care provider if you have any problems or questions after your procedure. What can I expect after the procedure? After the procedure, it is common to have:  Vomiting.  A sore throat.  Mental slowness.  It is common to feel:  Nauseous.  Cold or shivery.  Sleepy.  Tired.  Sore or achy, even in parts of your body where you did not have surgery.  Follow these instructions at home: For at least 24 hours after the procedure:  Do not: ? Participate in activities where you could fall or become injured. ? Drive. ? Use heavy  machinery. ? Drink alcohol. ? Take sleeping pills or medicines that cause drowsiness. ? Make important decisions or sign legal documents. ? Take care of children on your own.  Rest. Eating and drinking  If you vomit, drink water, juice, or soup when you can drink without vomiting.  Drink enough fluid to keep your urine clear or pale  yellow.  Make sure you have little or no nausea before eating solid foods.  Follow the diet recommended by your health care provider. General instructions  Have a responsible adult stay with you until you are awake and alert.  Return to your normal activities as told by your health care provider. Ask your health care provider what activities are safe for you.  Take over-the-counter and prescription medicines only as told by your health care provider.  If you smoke, do not smoke without supervision.  Keep all follow-up visits as told by your health care provider. This is important. Contact a health care provider if:  You continue to have nausea or vomiting at home, and medicines are not helpful.  You cannot drink fluids or start eating again.  You cannot urinate after 8-12 hours.  You develop a skin rash.  You have fever.  You have increasing redness at the site of your procedure. Get help right away if:  You have difficulty breathing.  You have chest pain.  You have unexpected bleeding.  You feel that you are having a life-threatening or urgent problem. This information is not intended to replace advice given to you by your health care provider. Make sure you discuss any questions you have with your health care provider. Document Released: 03/01/2001 Document Revised: 04/27/2016 Document Reviewed: 11/07/2015 Elsevier Interactive Patient Education  [employer].

## 2018-09-16 NOTE — Anesthesia Preprocedure Evaluation (Signed)
Anesthesia Evaluation  Patient identified by MRN, date of birth, ID band Patient awake    Reviewed: Allergy & Precautions, NPO status , Patient's Chart, lab work & pertinent test results  Airway Mallampati: II  TM Distance: >3 FB Neck ROM: Full    Dental no notable dental hx. (+) Poor Dentition, Missing   Pulmonary neg pulmonary ROS,    Pulmonary exam normal breath sounds clear to auscultation       Cardiovascular Exercise Tolerance: Poor hypertension, Pt. on medications + Peripheral Vascular Disease  Normal cardiovascular exam+ dysrhythmias II Rhythm:Regular Rate:Normal     Neuro/Psych Seizures -, Well Controlled,  Depression CVA negative psych ROS   GI/Hepatic negative GI ROS, Neg liver ROS,   Endo/Other  diabetes, Well Controlled, Type 1Hypothyroidism   Renal/GU Renal InsufficiencyRenal disease  negative genitourinary   Musculoskeletal  (+) Arthritis , Osteoarthritis,    Abdominal   Peds negative pediatric ROS (+)  Hematology negative hematology ROS (+) anemia ,   Anesthesia Other Findings   Reproductive/Obstetrics negative OB ROS                             Anesthesia Physical Anesthesia Plan  ASA: IV  Anesthesia Plan: General   Post-op Pain Management:    Induction: Intravenous  PONV Risk Score and Plan:   Airway Management Planned: Oral ETT  Additional Equipment:   Intra-op Plan:   Post-operative Plan: Extubation in OR  Informed Consent: I have reviewed the patients History and Physical, chart, labs and discussed the procedure including the risks, benefits and alternatives for the proposed anesthesia with the patient or authorized representative who has indicated his/her understanding and acceptance.   Dental advisory given  Plan Discussed with: CRNA  Anesthesia Plan Comments:         Anesthesia Quick Evaluation

## 2018-09-16 NOTE — Anesthesia Procedure Notes (Signed)
Procedure Name: Intubation Date/Time: 09/16/2018 9:22 AM Performed by: Andree Elk, Amy A, CRNA Pre-anesthesia Checklist: Patient identified, Patient being monitored, Timeout performed, Emergency Drugs available and Suction available Patient Re-evaluated:Patient Re-evaluated prior to induction Oxygen Delivery Method: Circle system utilized Preoxygenation: Pre-oxygenation with 100% oxygen Induction Type: IV induction Ventilation: Mask ventilation without difficulty Laryngoscope Size: 3 and Miller Grade View: Grade I Tube type: Oral Tube size: 7.0 mm Number of attempts: 1 Airway Equipment and Method: Stylet Placement Confirmation: ETT inserted through vocal cords under direct vision,  positive ETCO2 and breath sounds checked- equal and bilateral Secured at: 21 cm Tube secured with: Tape Dental Injury: Teeth and Oropharynx as per pre-operative assessment

## 2018-09-16 NOTE — H&P (Signed)
Chase [name] is an 84 y.o. male.   Chief Complaint: Patient is here for ERCP with stent and stone removal. HPI: Patient is 84 year old African-American male with multiple medical problems who presented with acute cholecystitis back in December 2015.  He was deemed to be too sick for cholecystectomy.  He was treated with cholecystostomy.  Ever since he has had biliary drainage from tube site.  He presented in March with signs and symptoms of cholangitis.  He underwent ERCP with sphincterotomy.  Stone could not be removed.  Therefore the stent was left in place.  He return for repeat ERCP/spyglass examination on May 27, 2018 with removal of few small fragments and large pigment stone.  Biliary stent was left in place. He has done well.  He has not experienced abdominal pain nausea vomiting or drainage from cholecystostomy tube site.  He is returning for removal of stent and if he still has residual stone. Patient came for preop 2 days ago.  He was noted to be in atrial fibrillation which is new in onset.  He was seen by Dr. Bronson Ing of cardiology and cleared for this procedure. He has been off clopidogrel for 5 days.  Past Medical History:  Diagnosis Date  . Bradycardia 03/15/2012  . Carotid artery occlusion   . Choledocholithiasis 02/25/2018  . CKD (chronic kidney disease) stage 4, GFR 15-29 ml/min (HCC)   . Complete lesion of cervical spinal cord ([place]) 03/14/3012   Stable since 2006  . CVA (cerebrovascular accident) ([place]) 09/07/12   right sided weakness  . Depressive disorder, not elsewhere classified   . Diabetes mellitus approx 1994  . Diabetic neuropathy ([place])   . History of kidney stones   . Hypertensive heart disease   . Hypothyroidism approx 2000  . Lacunar stroke, acute ([place]) 03/14/2012  . Obesity   . Osteoarthrosis, unspecified whether generalized or localized, lower leg   . Other and unspecified hyperlipidemia   . Peripheral vascular disease, unspecified ([place])   . Seizures ([place])     unknown etiology; on meds, last seizure was 2015  . Spinal stenosis, unspecified region other than cervical   . Spondylosis of unspecified site without mention of myelopathy     Past Surgical History:  Procedure Laterality Date  . BILIARY STENT PLACEMENT N/A 05/27/2018   Procedure: BILIARY STENT PLACEMENT;  Surgeon: Rogene Houston, MD;  [facility];  Service: Endoscopy;  Laterality: N/A;  . COLONOSCOPY N/A 08/10/2013   Procedure: COLONOSCOPY;  Surgeon: Rogene Houston, MD;  [facility];  Service: Endoscopy;  Laterality: N/A;  240  . ERCP N/A 03/02/2018   Procedure: ENDOSCOPIC RETROGRADE CHOLANGIOPANCREATOGRAPHY (ERCP) With sphincterotomy and stent placement;  Surgeon: Rogene Houston, MD;  [facility];  Service: Gastroenterology;  Laterality: N/A;  . ERCP N/A 05/27/2018   Procedure: ENDOSCOPIC RETROGRADE CHOLANGIOPANCREATOGRAPHY (ERCP);  Surgeon: Rogene Houston, MD;  [facility];  Service: Endoscopy;  Laterality: N/A;  . GASTROINTESTINAL STENT REMOVAL N/A 05/27/2018   Procedure: Biliary STENT REMOVAL;  Surgeon: Rogene Houston, MD;  [facility];  Service: Endoscopy;  Laterality: N/A;  . kidney stones left x2  1975  . KIDNEY SURGERY     Ruptured left kidney 30 yrs ago  from a kidney stone  . LITHOTRIPSY N/A 05/27/2018   Procedure: MECHANICAL LITHOTRIPSY;  Surgeon: Rogene Houston, MD;  [facility];  Service: Endoscopy;  Laterality: N/A;  . SPHINCTEROTOMY N/A 05/27/2018   Procedure:  SPHINCTEROTOMY extended;  Surgeon: Rogene Houston, MD;  [facility];  Service: Endoscopy;  Laterality: N/A;  . SPYGLASS CHOLANGIOSCOPY N/A 05/27/2018   Procedure: YPPJKDTO CHOLANGIOSCOPY;  Surgeon: Rogene Houston, MD;  [facility];  Service: Endoscopy;  Laterality: N/A;    Family History  Problem Relation Age of Onset  . Diabetes Mother   . Prostate cancer Father   . Hypertension Brother   . Hypertension  Brother   . Diabetes Brother   . Stroke Brother   . Diabetes Brother   . Diabetes Daughter   . Diabetes Daughter    Social History:  reports that he has never smoked. He has never used smokeless tobacco. He reports that he does not drink alcohol or use drugs.  Allergies:  Allergies  Allergen Reactions  . Bayer Advanced Aspirin [Aspirin] Nausea And Vomiting  . Penicillins Nausea And Vomiting    Has patient had a PCN reaction causing immediate rash, facial/tongue/throat swelling, SOB or lightheadedness with hypotension: unknown Has patient had a PCN reaction causing severe rash involving mucus membranes or skin necrosis: unknown Has patient had a PCN reaction that required hospitalization: unknown Has patient had a PCN reaction occurring within the last 10 years: no If all of the above answers are "NO", then may proceed with Cephalosporin use.    Medications Prior to Admission  Medication Sig Dispense Refill  . aspirin EC 81 MG tablet Take 1 tablet (81 mg total) by mouth daily. 150 tablet 2  . calcium carbonate (TUMS - DOSED IN MG ELEMENTAL CALCIUM) 500 MG chewable tablet Chew 1 tablet by mouth 2 (two) times daily. For Bone Health    . clopidogrel (PLAVIX) 75 MG tablet TAKE 1 TABLET BY MOUTH ONCE DAILY (Patient not taking: No sig reported) 30 tablet 1  . Insulin Isophane & Regular Human (NOVOLIN 70/30 FLEXPEN RELION) (70-30) 100 UNIT/ML PEN Inject 10 Units into the skin 2 (two) times daily before a meal. 5 pen 2  . levETIRAcetam (KEPPRA) 500 MG tablet Take 250 mg by mouth at bedtime.     Marland Kitchen levothyroxine (SYNTHROID, LEVOTHROID) 112 MCG tablet Take 1 tablet (112 mcg total) by mouth daily before breakfast. 90 tablet 3  . Multiple Vitamins-Minerals (MULTIVITAMIN WITH MINERALS) tablet Take 1 tablet by mouth daily.    Marland Kitchen olopatadine (PATANOL) 0.1 % ophthalmic solution INSTILL 1 DROP INTO EACH EYE TWICE DAILY (Patient taking differently: Place 1 drop into both eyes 2 (two) times daily. ) 5 mL 12   . pravastatin (PRAVACHOL) 20 MG tablet Take 1 tablet (20 mg total) by mouth daily. 90 tablet 3  . tamsulosin (FLOMAX) 0.4 MG CAPS capsule Take 0.4 mg by mouth daily.     . Vitamin D, Ergocalciferol, (DRISDOL) 50000 units CAPS capsule TAKE 1 CAPSULE BY MOUTH ONCE A WEEK (Patient taking differently: Take 50,000 Units by mouth every 7 (seven) days. On Sunday) 12 capsule 1  . amLODipine (NORVASC) 10 MG tablet TAKE 1 TABLET BY MOUTH ONCE DAILY 90 tablet 0  . ONE TOUCH ULTRA TEST test strip USE 1 STRIP TO CHECK GLUCOSE TWICE DAILY 100 each 5  . ONETOUCH DELICA LANCETS 67T MISC       Results for orders placed or performed during the hospital encounter of 09/16/18 (from the past 48 hour(s))  Glucose, capillary     Status: None   Collection Time: 09/16/18  8:14 AM  Result Value Ref Range   Glucose-Capillary 94 70 - 99 mg/dL  No results found.  ROS  Blood pressure (!) 152/75, pulse 64, SpO2 99 %. Physical Exam  Constitutional: He appears well-developed and well-nourished.  HENT:  Mouth/Throat: Oropharynx is clear and moist.  He has edema to left upper lip which appears to be chronic.  Eyes: Conjunctivae are normal. No scleral icterus.  Neck: No thyromegaly present.  Cardiovascular: Normal rate, regular rhythm and normal heart sounds.  No murmur heard. Respiratory: Effort normal and breath sounds normal.  GI:  Abdomen is symmetrical soft and nontender without organomegaly or masses.  Musculoskeletal: He exhibits no edema.  Lymphadenopathy:    He has no cervical adenopathy.  Neurological: He is alert.  Skin: Skin is warm and dry.     Assessment/Plan History of choledocholithiasis. ERCP with stent and stone removal.  Hildred Laser, MD 09/16/2018, 8:37 AM

## 2018-09-16 NOTE — Transfer of Care (Signed)
Immediate Anesthesia Transfer of Care Note  Patient: Chase [name]  Procedure(s) Performed: ENDOSCOPIC RETROGRADE CHOLANGIOPANCREATOGRAPHY (ERCP) (N/A ) GASTROINTESTINAL STENT REMOVAL (N/A ) REMOVAL OF MULTIPLE STONES WITH BASKET AND BALLOON (N/A )  Patient Location: PACU  Anesthesia Type:General  Level of Consciousness: awake, oriented and patient cooperative  Airway & Oxygen Therapy: Patient Spontanous Breathing  Post-op Assessment: Report given to RN and Post -op Vital signs reviewed and stable  Post vital signs: Reviewed and stable  Last Vitals:  Vitals Value Taken Time  BP 122/69 09/16/2018 10:20 AM  Temp    Pulse 75 09/16/2018 10:21 AM  Resp 20 09/16/2018 10:21 AM  SpO2 100 % 09/16/2018 10:21 AM  Vitals shown include unvalidated device data.  Last Pain:  Vitals:   09/16/18 0815  TempSrc: Oral  PainSc: 0-No pain         Complications: No apparent anesthesia complications

## 2018-09-16 NOTE — Op Note (Addendum)
[facility] Patient Name: Chase [name] Procedure Date: 09/16/2018 8:49 AM MRN: [id number] Date of Birth: Apr 07, 1931 Attending MD: Hildred Laser , MD CSN: [id number] Age: 84 Admit Type: Outpatient Procedure:                ERCP Indications:              For therapy of bile duct stone(s), Biliary stent                            removal Providers:                Hildred Laser, MD, Otis Peak B. Sharon Seller, RN, Nelma Rothman, Technician Referring MD:             Norwood Levo. Moshe Cipro MD, MD Medicines:                General Anesthesia Complications:            No immediate complications. Estimated Blood Loss:     Estimated blood loss was minimal from ampulla. Procedure:                Pre-Anesthesia Assessment:                           - Prior to the procedure, a History and Physical                            was performed, and patient medications and                            allergies were reviewed. The patient's tolerance of                            previous anesthesia was also reviewed. The risks                            and benefits of the procedure and the sedation                            options and risks were discussed with the patient.                            All questions were answered, and informed consent                            was obtained. Prior Anticoagulants: The patient                            last took aspirin 3 days and Plavix (clopidogrel) 5                            days prior to the procedure. ASA Grade Assessment:  IV - A patient with severe systemic disease that is                            a constant threat to life. After reviewing the                            risks and benefits, the patient was deemed in                            satisfactory condition to undergo the procedure.                           After obtaining informed consent, the scope was                            passed under  direct vision. Throughout the                            procedure, the patient's blood pressure, pulse, and                            oxygen saturations were monitored continuously. The                            TJF-Q180V ([id number]) scope was introduced through                            the mouth, and used to inject contrast into and                            used to inject contrast into the bile duct. The                            TJF-Q180V ([id number]) scope was introduced through                            the mouth, and used to inject contrast into. The                            TJF-Q180V ([id number]) scope was introduced through                            the and used to inject contrast into. The ERCP was                            accomplished without difficulty. The patient                            tolerated the procedure well. Scope In: 9:38:51 AM Scope Out: 10:04:15 AM Total Procedure Duration: 0 hours 25 minutes 24 seconds  Findings:      A biliary stent was visible on the scout film. The esophagus was       successfully intubated under direct  vision. The scope was advanced to a       normal major papilla in the descending duodenum without detailed       examination of the pharynx, larynx and associated structures, and upper       GI tract. The upper GI tract was grossly normal. One stent was removed       from the biliary tree using a snare. The stent was found to be partially       occluded via the water column test. Bile duct cannulated with Rx 44       autotome and dilute contrast injected and biliasry tree outlined. Mildly       dilateed CBD and CHD with multiple filling defects/stones. To discover       stones, the biliary tree was swept with an 11.5 mm balloon, 25 mm       balloon and basket starting at the bifurcation. Sludge was swept from       the duct. Many stones were removed. No stones remained. Impression:               - Choledocholithiasis was found. Complete  removal                            was accomplished by balloon extraction.                           - One stent was removed from the biliary tree.                           - The biliary tree was swept. Moderate Sedation:      Per Anesthesia Care Recommendation:           - Discharge patient to home (with spouse).                           - Avoid aspirin and nonsteroidal anti-inflammatory                            medicines for 1 day.                           - Continue present medications.                           - Resume aspirin tomorrow and Plavix (clopidogrel)                            in 2 days at prior doses.                           - Clear liquid diet today.                           - Resume previous diet for 1 day.                           - OV in 2 months. Procedure Code(s):        --- Professional ---  39767, Endoscopic retrograde                            cholangiopancreatography (ERCP); with removal of                            foreign body(s) or stent(s) from biliary/pancreatic                            duct(s)                           43264, Endoscopic retrograde                            cholangiopancreatography (ERCP); with removal of                            calculi/debris from biliary/pancreatic duct(s) Diagnosis Code(s):        --- Professional ---                           K80.50, Calculus of bile duct without cholangitis                            or cholecystitis without obstruction                           Z46.59, Encounter for fitting and adjustment of                            other gastrointestinal appliance and device CPT copyright 2018 [employer]. All rights reserved. The codes documented in this report are preliminary and upon coder review may  be revised to meet current compliance requirements. Hildred Laser, MD Hildred Laser, MD 09/16/2018 10:22:19 AM This report has been signed  electronically. Number of Addenda: 0

## 2018-09-16 NOTE — Anesthesia Postprocedure Evaluation (Signed)
Anesthesia Post Note  Patient: Chase [name]  Procedure(s) Performed: ENDOSCOPIC RETROGRADE CHOLANGIOPANCREATOGRAPHY (ERCP) (N/A ) GASTROINTESTINAL STENT REMOVAL (N/A ) REMOVAL OF MULTIPLE STONES WITH BASKET AND BALLOON (N/A )  Patient location during evaluation: PACU Anesthesia Type: General Level of consciousness: awake and alert and oriented Pain management: pain level controlled Vital Signs Assessment: post-procedure vital signs reviewed and stable Respiratory status: spontaneous breathing Cardiovascular status: stable Postop Assessment: no apparent nausea or vomiting Anesthetic complications: no     Last Vitals:  Vitals:   09/16/18 1030 09/16/18 1045  BP: 118/66 131/79  Pulse: 70 68  Resp: 16 15  Temp:    SpO2: 100% 98%    Last Pain:  Vitals:   09/16/18 1045  TempSrc:   PainSc: 0-No pain                 Danne Scardina A

## 2018-09-21 ENCOUNTER — Encounter (HOSPITAL_COMMUNITY): Payer: Self-pay | Admitting: Internal Medicine

## 2018-09-26 ENCOUNTER — Telehealth: Payer: Self-pay | Admitting: Family Medicine

## 2018-09-26 ENCOUNTER — Telehealth (INDEPENDENT_AMBULATORY_CARE_PROVIDER_SITE_OTHER): Payer: Self-pay | Admitting: *Deleted

## 2018-09-26 ENCOUNTER — Ambulatory Visit (INDEPENDENT_AMBULATORY_CARE_PROVIDER_SITE_OTHER): Payer: Medicare Other

## 2018-09-26 VITALS — BP 130/67 | HR 87 | Resp 11 | Ht 68.0 in | Wt 172.0 lb

## 2018-09-26 DIAGNOSIS — Z0000 Encounter for general adult medical examination without abnormal findings: Secondary | ICD-10-CM | POA: Diagnosis not present

## 2018-09-26 DIAGNOSIS — Z23 Encounter for immunization: Secondary | ICD-10-CM | POA: Diagnosis not present

## 2018-09-26 NOTE — Telephone Encounter (Signed)
He has been having loose stool since Thursday, and states that he doesn't make it to the bathroom. She states this does not seem ok, I asked if he changed his diet and she stated no, there was a long pink pill that he was out of, that she just got refilled, but that has been the only change. She would like a call back

## 2018-09-26 NOTE — Patient Instructions (Signed)
Chase [name] , Thank you for taking time to come for your Medicare Wellness Visit. I appreciate your ongoing commitment to your health goals. Please review the following plan we discussed and let me know if I can assist you in the future.   Screening recommendations/referrals: Colonoscopy: Doctor Rehman following- 5 years ago  Recommended yearly ophthalmology/optometry visit for glaucoma screening and checkup Recommended yearly dental visit for hygiene and checkup  Vaccinations: Influenza vaccine: given today  Pneumococcal vaccine: up to date  Tdap vaccine: up to date  Shingles vaccine:  Postponed will check with insurance   Advanced directives: information given   Conditions/risks identified: impaired mobility, diabetes  Next appointment: wellness visit in one year   Preventive Care 16 Years and Older, Male Preventive care refers to lifestyle choices and visits with your health care provider that can promote health and wellness. What does preventive care include?  A yearly physical exam. This is also called an annual well check.  Dental exams once or twice a year.  Routine eye exams. Ask your health care provider how often you should have your eyes checked.  Personal lifestyle choices, including:  Daily care of your teeth and gums.  Regular physical activity.  Eating a healthy diet.  Avoiding tobacco and drug use.  Limiting alcohol use.  Practicing safe sex.  Taking low doses of aspirin every day.  Taking vitamin and mineral supplements as recommended by your health care provider. What happens during an annual well check? The services and screenings done by your health care provider during your annual well check will depend on your age, overall health, lifestyle risk factors, and family history of disease. Counseling  Your health care provider may ask you questions about your:  Alcohol use.  Tobacco use.  Drug use.  Emotional well-being.  Home and  relationship well-being.  Sexual activity.  Eating habits.  History of falls.  Memory and ability to understand (cognition).  Work and work Statistician. Screening  You may have the following tests or measurements:  Height, weight, and BMI.  Blood pressure.  Lipid and cholesterol levels. These may be checked every 5 years, or more frequently if you are over 64 years old.  Skin check.  Lung cancer screening. You may have this screening every year starting at age 76 if you have a 30-pack-year history of smoking and currently smoke or have quit within the past 15 years.  Fecal occult blood test (FOBT) of the stool. You may have this test every year starting at age 27.  Flexible sigmoidoscopy or colonoscopy. You may have a sigmoidoscopy every 5 years or a colonoscopy every 10 years starting at age 31.  Prostate cancer screening. Recommendations will vary depending on your family history and other risks.  Hepatitis C blood test.  Hepatitis B blood test.  Sexually transmitted disease (STD) testing.  Diabetes screening. This is done by checking your blood sugar (glucose) after you have not eaten for a while (fasting). You may have this done every 1-3 years.  Abdominal aortic aneurysm (AAA) screening. You may need this if you are a current or former smoker.  Osteoporosis. You may be screened starting at age 66 if you are at high risk. Talk with your health care provider about your test results, treatment options, and if necessary, the need for more tests. Vaccines  Your health care provider may recommend certain vaccines, such as:  Influenza vaccine. This is recommended every year.  Tetanus, diphtheria, and acellular pertussis (Tdap, Td) vaccine.  You may need a Td booster every 10 years.  Zoster vaccine. You may need this after age 22.  Pneumococcal 13-valent conjugate (PCV13) vaccine. One dose is recommended after age 76.  Pneumococcal polysaccharide (PPSV23) vaccine. One  dose is recommended after age 69. Talk to your health care provider about which screenings and vaccines you need and how often you need them. This information is not intended to replace advice given to you by your health care provider. Make sure you discuss any questions you have with your health care provider. Document Released: 12/20/2015 Document Revised: 08/12/2016 Document Reviewed: 09/24/2015 Elsevier Interactive Patient Education  2017 [place] Prevention in the Home Falls can cause injuries. They can happen to people of all ages. There are many things you can do to make your home safe and to help prevent falls. What can I do on the outside of my home?  Regularly fix the edges of walkways and driveways and fix any cracks.  Remove anything that might make you trip as you walk through a door, such as a raised step or threshold.  Trim any bushes or trees on the path to your home.  Use bright outdoor lighting.  Clear any walking paths of anything that might make someone trip, such as rocks or tools.  Regularly check to see if handrails are loose or broken. Make sure that both sides of any steps have handrails.  Any raised decks and porches should have guardrails on the edges.  Have any leaves, snow, or ice cleared regularly.  Use sand or salt on walking paths during winter.  Clean up any spills in your garage right away. This includes oil or grease spills. What can I do in the bathroom?  Use night lights.  Install grab bars by the toilet and in the tub and shower. Do not use towel bars as grab bars.  Use non-skid mats or decals in the tub or shower.  If you need to sit down in the shower, use a plastic, non-slip stool.  Keep the floor dry. Clean up any water that spills on the floor as soon as it happens.  Remove soap buildup in the tub or shower regularly.  Attach bath mats securely with double-sided non-slip rug tape.  Do not have throw rugs and other  things on the floor that can make you trip. What can I do in the bedroom?  Use night lights.  Make sure that you have a light by your bed that is easy to reach.  Do not use any sheets or blankets that are too big for your bed. They should not hang down onto the floor.  Have a firm chair that has side arms. You can use this for support while you get dressed.  Do not have throw rugs and other things on the floor that can make you trip. What can I do in the kitchen?  Clean up any spills right away.  Avoid walking on wet floors.  Keep items that you use a lot in easy-to-reach places.  If you need to reach something above you, use a strong step stool that has a grab bar.  Keep electrical cords out of the way.  Do not use floor polish or wax that makes floors slippery. If you must use wax, use non-skid floor wax.  Do not have throw rugs and other things on the floor that can make you trip. What can I do with my stairs?  Do not leave any  items on the stairs.  Make sure that there are handrails on both sides of the stairs and use them. Fix handrails that are broken or loose. Make sure that handrails are as long as the stairways.  Check any carpeting to make sure that it is firmly attached to the stairs. Fix any carpet that is loose or worn.  Avoid having throw rugs at the top or bottom of the stairs. If you do have throw rugs, attach them to the floor with carpet tape.  Make sure that you have a light switch at the top of the stairs and the bottom of the stairs. If you do not have them, ask someone to add them for you. What else can I do to help prevent falls?  Wear shoes that:  Do not have high heels.  Have rubber bottoms.  Are comfortable and fit you well.  Are closed at the toe. Do not wear sandals.  If you use a stepladder:  Make sure that it is fully opened. Do not climb a closed stepladder.  Make sure that both sides of the stepladder are locked into place.  Ask  someone to hold it for you, if possible.  Clearly mark and make sure that you can see:  Any grab bars or handrails.  First and last steps.  Where the edge of each step is.  Use tools that help you move around (mobility aids) if they are needed. These include:  Canes.  Walkers.  Scooters.  Crutches.  Turn on the lights when you go into a dark area. Replace any light bulbs as soon as they burn out.  Set up your furniture so you have a clear path. Avoid moving your furniture around.  If any of your floors are uneven, fix them.  If there are any pets around you, be aware of where they are.  Review your medicines with your doctor. Some medicines can make you feel dizzy. This can increase your chance of falling. Ask your doctor what other things that you can do to help prevent falls. This information is not intended to replace advice given to you by your health care provider. Make sure you discuss any questions you have with your health care provider. Document Released: 09/19/2009 Document Revised: 04/30/2016 Document Reviewed: 12/28/2014 Elsevier Interactive Patient Education  2017 [employer].

## 2018-09-26 NOTE — Telephone Encounter (Signed)
Returned patients call to discuss message left. No answer. Left generic message requesting call back.

## 2018-09-26 NOTE — Progress Notes (Signed)
Subjective:   Chase [name] is a 84 y.o. male who presents for Medicare Annual/Subsequent preventive examination.  Review of Systems:   Cardiac Risk Factors include: diabetes mellitus;dyslipidemia;male gender;hypertension;advanced age (>43men, >67 women)     Objective:    Vitals: BP 130/67   Pulse 87   Resp 11   Ht 5\' 8"  (1.727 m)   Wt 172 lb (78 kg)   SpO2 98%   BMI 26.15 kg/m   Body mass index is 26.15 kg/m.  Advanced Directives 09/26/2018 09/14/2018 05/27/2018 05/23/2018 02/26/2018 10/06/2017 05/07/2017  Does Patient Have a Medical Advance Directive? No No No No No No No  Would patient like information on creating a medical advance directive? Yes (ED - Information included in AVS) - No - Patient declined Yes (MAU/Ambulatory/Procedural Areas - Information given) No - Patient declined Yes (MAU/Ambulatory/Procedural Areas - Information given) No - Patient declined  Pre-existing out of facility DNR order (yellow form or pink MOST form) - - - - - - -    Tobacco Social History   Tobacco Use  Smoking Status Never Smoker  Smokeless Tobacco Never Used     Counseling given: Not Answered   Clinical Intake:  Pre-visit preparation completed: Yes  Pain : No/denies pain Pain Score: 0-No pain     BMI - recorded: 26.2 Nutritional Status: BMI 25 -29 Overweight Nutritional Risks: None Diabetes: Yes CBG done?: No Did pt. bring in CBG monitor from home?: No  How often do you need to have someone help you when you read instructions, pamphlets, or other written materials from your doctor or pharmacy?: 5 - Always What is the last grade level you completed in school?: 8th grade   Interpreter Needed?: No  Information entered by :: Francena Hanly LPN  Past Medical History:  Diagnosis Date  . Bradycardia 03/15/2012  . Carotid artery occlusion   . Choledocholithiasis 02/25/2018  . CKD (chronic kidney disease) stage 4, GFR 15-29 ml/min (HCC)   . Complete lesion of cervical spinal cord  ([place]) 03/14/3012   Stable since 2006  . CVA (cerebrovascular accident) ([place]) 09/07/12   right sided weakness  . Depressive disorder, not elsewhere classified   . Diabetes mellitus approx 1994  . Diabetic neuropathy ([place])   . History of kidney stones   . Hypertensive heart disease   . Hypothyroidism approx 2000  . Lacunar stroke, acute ([place]) 03/14/2012  . Obesity   . Osteoarthrosis, unspecified whether generalized or localized, lower leg   . Other and unspecified hyperlipidemia   . Peripheral vascular disease, unspecified ([place])   . Seizures ([place])    unknown etiology; on meds, last seizure was 2015  . Spinal stenosis, unspecified region other than cervical   . Spondylosis of unspecified site without mention of myelopathy    Past Surgical History:  Procedure Laterality Date  . BILIARY STENT PLACEMENT N/A 05/27/2018   Procedure: BILIARY STENT PLACEMENT;  Surgeon: Rogene Houston, MD;  [facility];  Service: Endoscopy;  Laterality: N/A;  . COLONOSCOPY N/A 08/10/2013   Procedure: COLONOSCOPY;  Surgeon: Rogene Houston, MD;  [facility];  Service: Endoscopy;  Laterality: N/A;  240  . ERCP N/A 03/02/2018   Procedure: ENDOSCOPIC RETROGRADE CHOLANGIOPANCREATOGRAPHY (ERCP) With sphincterotomy and stent placement;  Surgeon: Rogene Houston, MD;  [facility];  Service: Gastroenterology;  Laterality: N/A;  . ERCP N/A 05/27/2018   Procedure: ENDOSCOPIC RETROGRADE CHOLANGIOPANCREATOGRAPHY (ERCP);  Surgeon: Rogene Houston, MD;  [facility];  Service: Endoscopy;  Laterality: N/A;  . ERCP N/A 09/16/2018   Procedure: ENDOSCOPIC RETROGRADE CHOLANGIOPANCREATOGRAPHY (ERCP);  Surgeon: Rogene Houston, MD;  [facility];  Service: Endoscopy;  Laterality: N/A;  . GASTROINTESTINAL STENT REMOVAL N/A 05/27/2018   Procedure: Biliary STENT REMOVAL;  Surgeon: Rogene Houston, MD;  [facility];  Service: Endoscopy;  Laterality: N/A;  .  GASTROINTESTINAL STENT REMOVAL N/A 09/16/2018   Procedure: GASTROINTESTINAL STENT REMOVAL;  Surgeon: Rogene Houston, MD;  [facility];  Service: Endoscopy;  Laterality: N/A;  . kidney stones left x2  1975  . KIDNEY SURGERY     Ruptured left kidney 30 yrs ago  from a kidney stone  . LITHOTRIPSY N/A 05/27/2018   Procedure: MECHANICAL LITHOTRIPSY;  Surgeon: Rogene Houston, MD;  [facility];  Service: Endoscopy;  Laterality: N/A;  . REMOVAL OF STONES N/A 09/16/2018   Procedure: REMOVAL OF MULTIPLE STONES WITH BASKET AND BALLOON;  Surgeon: Rogene Houston, MD;  [facility];  Service: Endoscopy;  Laterality: N/A;  . SPHINCTEROTOMY N/A 05/27/2018   Procedure: SPHINCTEROTOMY extended;  Surgeon: Rogene Houston, MD;  [facility];  Service: Endoscopy;  Laterality: N/A;  . SPYGLASS CHOLANGIOSCOPY N/A 05/27/2018   Procedure: JJOACZYS CHOLANGIOSCOPY;  Surgeon: Rogene Houston, MD;  [facility];  Service: Endoscopy;  Laterality: N/A;   Family History  Problem Relation Age of Onset  . Diabetes Mother   . Prostate cancer Father   . Hypertension Brother   . Hypertension Brother   . Diabetes Brother   . Stroke Brother   . Diabetes Brother   . Diabetes Daughter   . Diabetes Daughter    Social History   Socioeconomic History  . Marital status: Married    Spouse name: Not on file  . Number of children: 5  . Years of education: 8  . Highest education level: 8th grade  Occupational History  . Occupation: retired     Fish farm manager: RETIRED  Social Needs  . Financial resource strain: Somewhat hard  . Food insecurity:    Worry: Never true    Inability: Never true  . Transportation needs:    Medical: No    Non-medical: No  Tobacco Use  . Smoking status: Never Smoker  . Smokeless tobacco: Never Used  Substance and Sexual Activity  . Alcohol use: No    Alcohol/week: 0.0 standard drinks  . Drug use: No  . Sexual activity: Not Currently    Lifestyle  . Physical activity:    Days per week: 0 days    Minutes per session: 0 min  . Stress: Not at all  Relationships  . Social connections:    Talks on phone: Three times a week    Gets together: Twice a week    Attends religious service: Never    Active member of club or organization: No    Attends meetings of clubs or organizations: Not on file    Relationship status: Married  Other Topics Concern  . Not on file  Social History Narrative  . Not on file    Outpatient Encounter Medications as of 09/26/2018  Medication Sig  . amLODipine (NORVASC) 10 MG tablet TAKE 1 TABLET BY MOUTH ONCE DAILY  . aspirin EC 81 MG tablet Take 1 tablet (81 mg total) by mouth daily.  . calcium carbonate (TUMS - DOSED IN MG ELEMENTAL CALCIUM) 500 MG chewable tablet Chew 1 tablet  by mouth 2 (two) times daily. For Bone Health  . clopidogrel (PLAVIX) 75 MG tablet Take 1 tablet (75 mg total) by mouth daily.  . Insulin Isophane & Regular Human (NOVOLIN 70/30 FLEXPEN RELION) (70-30) 100 UNIT/ML PEN Inject 10 Units into the skin 2 (two) times daily before a meal.  . levETIRAcetam (KEPPRA) 500 MG tablet Take 250 mg by mouth at bedtime.   Marland Kitchen levothyroxine (SYNTHROID, LEVOTHROID) 112 MCG tablet Take 1 tablet (112 mcg total) by mouth daily before breakfast.  . Multiple Vitamins-Minerals (MULTIVITAMIN WITH MINERALS) tablet Take 1 tablet by mouth daily.  Marland Kitchen olopatadine (PATANOL) 0.1 % ophthalmic solution INSTILL 1 DROP INTO EACH EYE TWICE DAILY (Patient taking differently: Place 1 drop into both eyes 2 (two) times daily. )  . ONE TOUCH ULTRA TEST test strip USE 1 STRIP TO CHECK GLUCOSE TWICE DAILY  . ONETOUCH DELICA LANCETS 88B MISC   . pravastatin (PRAVACHOL) 20 MG tablet Take 1 tablet (20 mg total) by mouth daily.  . tamsulosin (FLOMAX) 0.4 MG CAPS capsule Take 0.4 mg by mouth daily.   . Vitamin D, Ergocalciferol, (DRISDOL) 50000 units CAPS capsule TAKE 1 CAPSULE BY MOUTH ONCE A WEEK (Patient taking  differently: Take 50,000 Units by mouth every 7 (seven) days. On Sunday)   No facility-administered encounter medications on file as of 09/26/2018.     Activities of Daily Living In your present state of health, do you have any difficulty performing the following activities: 09/26/2018 09/14/2018  Hearing? Tempie Donning  Vision? N Y  Comment - left decreased  Difficulty concentrating or making decisions? Y N  Walking or climbing stairs? Y Y  Dressing or bathing? Y Y  Doing errands, shopping? N [place] and eating ? Y -  Using the Toilet? Y -  In the past six months, have you accidently leaked urine? Y -  Do you have problems with loss of bowel control? Y -  Managing your Medications? Y -  Managing your Finances? Y -  Housekeeping or managing your Housekeeping? Y -  Some recent data might be hidden    Patient Care Team: [name], MD as PCP - General Herminio Commons, MD as PCP - Cardiology (Cardiology) Herminio Commons, MD as Attending Physician (Cardiology) Fran Lowes, MD as Consulting Physician (Nephrology) Phillips Odor, MD as Consulting Physician (Neurology) Cassandria Anger, MD as Consulting Physician (Endocrinology) Georganna Skeans, MD as Consulting Physician (General Surgery)   Assessment:   This is a routine wellness examination for [place].  Exercise Activities and Dietary recommendations Current Exercise Habits: The patient does not participate in regular exercise at present, Exercise limited by: orthopedic condition(s)  Goals    . DIET - INCREASE LEAN PROTEINS    . Exercise 3x per week (30 min per time)     Recommend starting a routine exercise program at least 3 days a week for 30-45 minutes at a time as tolerated.     . Increase physical activity    . Patient Stated     I want to eat more and move around more       Fall Risk Fall Risk  09/26/2018 08/17/2018 02/16/2018 02/09/2018 10/06/2017  Falls in the past year?  Yes No No No No  Number falls in past yr: 1 - - - -  Injury with Fall? No - - - -  Risk for fall due to : History of fall(s);Medication side effect;Impaired balance/gait;Impaired mobility;Impaired vision -  Impaired balance/gait;Impaired mobility - Impaired balance/gait;Impaired mobility  Follow up Falls prevention discussed - - - -   Is the patient's home free of loose throw rugs in walkways, pet beds, electrical cords, etc?   yes      Grab bars in the bathroom? yes      Handrails on the stairs?   yes      Adequate lighting?   yes  Timed Get Up and Go Performed: Patient able to perform in 12 seconds with the assistance of a walker   Depression Screen PHQ 2/9 Scores 09/26/2018 08/17/2018 06/07/2018 02/16/2018  PHQ - 2 Score 3 0 0 0  PHQ- 9 Score 4 - - -    Cognitive Function MMSE - Mini Mental State Exam 10/06/2017  Orientation to time 1  Orientation to Place 5  Registration 3  Attention/ Calculation 4  Recall 1  Language- name 2 objects 2  Language- repeat 0  Language- follow 3 step command 3  Language- read & follow direction 1  Write a sentence 1  Copy design 1  Total score 22     6CIT Screen 09/26/2018  What Year? 4 points  What month? 0 points  What time? 3 points  Count back from 20 0 points  Months in reverse 4 points  Repeat phrase 2 points  Total Score 13    Immunization History  Administered Date(s) Administered  . H1N1 11/14/2008  . Influenza Split 10/07/2011, 09/08/2012  . Influenza Whole 08/22/2007, 08/27/2010  . Influenza,inj,Quad PF,6+ Mos 08/22/2013, 09/26/2014, 09/26/2015, 09/01/2016, 09/20/2017  . Pneumococcal Conjugate-13 06/12/2014  . Pneumococcal Polysaccharide-23 05/21/2004  . Td 05/21/2004  . Tdap 10/07/2011    Qualifies for Shingles Vaccine? Will check with insurance   Screening Tests Health Maintenance  Topic Date Due  . URINE MICROALBUMIN  02/26/2018  . FOOT EXAM  02/28/2018  . OPHTHALMOLOGY EXAM  05/06/2018  . INFLUENZA VACCINE   07/07/2018  . HEMOGLOBIN A1C  12/02/2018  . TETANUS/TDAP  10/06/2021  . PNA vac Low Risk Adult  Completed   Cancer Screenings: Lung: Low Dose CT Chest recommended if Age 46-80 years, 30 pack-year currently smoking OR have quit w/in 15years. Patient does not qualify. Colorectal: followed by Dr. Laural Golden   Additional Screenings: Hepatitis C Screening: N/A       Plan:   Increase physical activity, decrease fall risk,   I have personally reviewed and noted the following in the patient's chart:   . Medical and social history . Use of alcohol, tobacco or illicit drugs  . Current medications and supplements . Functional ability and status . Nutritional status . Physical activity . Advanced directives . List of other physicians . Hospitalizations, surgeries, and ER visits in previous 12 months . Vitals . Screenings to include cognitive, depression, and falls . Referrals and appointments  In addition, I have reviewed and discussed with patient certain preventive protocols, quality metrics, and best practice recommendations. A written personalized care plan for preventive services as well as general preventive health recommendations were provided to patient.     Francoise Schaumann, LPN  [id number]

## 2018-09-26 NOTE — Telephone Encounter (Signed)
Mrs Stranahan, is calling in regarding Mr Polo

## 2018-09-27 ENCOUNTER — Other Ambulatory Visit (INDEPENDENT_AMBULATORY_CARE_PROVIDER_SITE_OTHER): Payer: Self-pay | Admitting: *Deleted

## 2018-09-27 DIAGNOSIS — K833 Fistula of bile duct: Secondary | ICD-10-CM

## 2018-09-27 DIAGNOSIS — R945 Abnormal results of liver function studies: Secondary | ICD-10-CM

## 2018-09-27 DIAGNOSIS — R7989 Other specified abnormal findings of blood chemistry: Secondary | ICD-10-CM

## 2018-09-27 DIAGNOSIS — K8050 Calculus of bile duct without cholangitis or cholecystitis without obstruction: Secondary | ICD-10-CM

## 2018-09-27 NOTE — Telephone Encounter (Signed)
Patient's wife presented to the office on 09/26/2018. Mr. Chase [name] had ERCP Stent and Stone removal on 09/16/2018. States that her husband started having diarrhea on last Thursday , No change in color. It happens anytime but mostly prior to him going to bed or at 3 am in the morning. All medications ate the same as they were prior to the procedure. He is having a lot of gas. Yesterday and this morning it wasn't as much stool in his depend.  Discussed with Dr.Rehman , he ask that the patient take 1 Imodium 2 mg by mouth before evening meal. Patient has appointment in December , we will get LFT's prior to that appointment.  Chase [name] called and a message was left on her voicemail with Dr.Rehman's recommendation. Lab is arranged for 11/12/2018. Patient will be sent a letter as a reminder.

## 2018-09-27 NOTE — Telephone Encounter (Signed)
Spoke with patient's spouse and she stated that so far today diarrhea seems better. I told her I would forward the message to [place] with verbal understanding. She said it was ok to leave a detailed message on her machine with treatment plan because she has to go out because of a recent death in the family.

## 2018-09-28 NOTE — Telephone Encounter (Signed)
From msg yesterday no real concern anymotre, seems as though diarrheah subasided, verify no further concernand sign off please .

## 2018-09-30 NOTE — Telephone Encounter (Signed)
Spoke with spouse and patient is feeling well and everything is back to normal

## 2018-10-04 ENCOUNTER — Ambulatory Visit: Payer: Medicare Other | Admitting: Cardiovascular Disease

## 2018-10-07 DIAGNOSIS — B351 Tinea unguium: Secondary | ICD-10-CM | POA: Diagnosis not present

## 2018-10-07 DIAGNOSIS — E1142 Type 2 diabetes mellitus with diabetic polyneuropathy: Secondary | ICD-10-CM | POA: Diagnosis not present

## 2018-10-17 DIAGNOSIS — C8409 Mycosis fungoides, extranodal and solid organ sites: Secondary | ICD-10-CM | POA: Diagnosis not present

## 2018-11-14 ENCOUNTER — Ambulatory Visit (INDEPENDENT_AMBULATORY_CARE_PROVIDER_SITE_OTHER): Payer: Medicare Other | Admitting: Internal Medicine

## 2018-11-15 ENCOUNTER — Ambulatory Visit (INDEPENDENT_AMBULATORY_CARE_PROVIDER_SITE_OTHER): Payer: Medicare Other | Admitting: Internal Medicine

## 2018-11-15 ENCOUNTER — Encounter (INDEPENDENT_AMBULATORY_CARE_PROVIDER_SITE_OTHER): Payer: Self-pay | Admitting: Internal Medicine

## 2018-11-16 ENCOUNTER — Other Ambulatory Visit (INDEPENDENT_AMBULATORY_CARE_PROVIDER_SITE_OTHER): Payer: Self-pay | Admitting: *Deleted

## 2018-11-16 ENCOUNTER — Ambulatory Visit (INDEPENDENT_AMBULATORY_CARE_PROVIDER_SITE_OTHER): Payer: Medicare Other | Admitting: Internal Medicine

## 2018-11-16 ENCOUNTER — Encounter (INDEPENDENT_AMBULATORY_CARE_PROVIDER_SITE_OTHER): Payer: Self-pay | Admitting: Internal Medicine

## 2018-11-16 VITALS — BP 160/78 | HR 72 | Temp 97.0°F | Ht 68.0 in | Wt 172.2 lb

## 2018-11-16 DIAGNOSIS — I10 Essential (primary) hypertension: Secondary | ICD-10-CM | POA: Diagnosis not present

## 2018-11-16 DIAGNOSIS — R809 Proteinuria, unspecified: Secondary | ICD-10-CM | POA: Diagnosis not present

## 2018-11-16 DIAGNOSIS — Z1159 Encounter for screening for other viral diseases: Secondary | ICD-10-CM | POA: Diagnosis not present

## 2018-11-16 DIAGNOSIS — K8021 Calculus of gallbladder without cholecystitis with obstruction: Secondary | ICD-10-CM

## 2018-11-16 DIAGNOSIS — K833 Fistula of bile duct: Secondary | ICD-10-CM

## 2018-11-16 DIAGNOSIS — D509 Iron deficiency anemia, unspecified: Secondary | ICD-10-CM | POA: Diagnosis not present

## 2018-11-16 DIAGNOSIS — R7989 Other specified abnormal findings of blood chemistry: Secondary | ICD-10-CM

## 2018-11-16 DIAGNOSIS — K8050 Calculus of bile duct without cholangitis or cholecystitis without obstruction: Secondary | ICD-10-CM | POA: Diagnosis not present

## 2018-11-16 DIAGNOSIS — E559 Vitamin D deficiency, unspecified: Secondary | ICD-10-CM | POA: Diagnosis not present

## 2018-11-16 DIAGNOSIS — R945 Abnormal results of liver function studies: Secondary | ICD-10-CM | POA: Diagnosis not present

## 2018-11-16 DIAGNOSIS — Z79899 Other long term (current) drug therapy: Secondary | ICD-10-CM | POA: Diagnosis not present

## 2018-11-16 NOTE — Progress Notes (Signed)
Subjective:    Patient ID: Chase [name], male    DOB: [birth year], 84 y.o.   MRN: [id number]  HPI Here today for f/u. Underwent an ERCP back in October of this year  for Biliary stent removal.  He underwent an ERCP in June of this year with stent exchange, extension of biliary sphincterotomy, spyglass examination and mechanical lithotripsy  Occluded biliary stent removed).   Appetite is good. BMs aer normal. Denies any abdominal pain. Walks with a walker.  He has no GI complaints.  Family states he is doing well.   Hx of CVA and maintained on Plavix.  Review of Systems     Past Medical History:  Diagnosis Date  . Bradycardia 03/15/2012  . Carotid artery occlusion   . Choledocholithiasis 02/25/2018  . CKD (chronic kidney disease) stage 4, GFR 15-29 ml/min (HCC)   . Complete lesion of cervical spinal cord ([place]) 03/14/3012   Stable since 2006  . CVA (cerebrovascular accident) ([place]) 09/07/12   right sided weakness  . Depressive disorder, not elsewhere classified   . Diabetes mellitus approx 1994  . Diabetic neuropathy ([place])   . History of kidney stones   . Hypertensive heart disease   . Hypothyroidism approx 2000  . Lacunar stroke, acute ([place]) 03/14/2012  . Obesity   . Osteoarthrosis, unspecified whether generalized or localized, lower leg   . Other and unspecified hyperlipidemia   . Peripheral vascular disease, unspecified ([place])   . Seizures ([place])    unknown etiology; on meds, last seizure was 2015  . Spinal stenosis, unspecified region other than cervical   . Spondylosis of unspecified site without mention of myelopathy     Past Surgical History:  Procedure Laterality Date  . BILIARY STENT PLACEMENT N/A 05/27/2018   Procedure: BILIARY STENT PLACEMENT;  Surgeon: Rogene Houston, MD;  [facility];  Service: Endoscopy;  Laterality: N/A;  . COLONOSCOPY N/A 08/10/2013   Procedure: COLONOSCOPY;  Surgeon: Rogene Houston, MD;  [facility];  Service: Endoscopy;   Laterality: N/A;  240  . ERCP N/A 03/02/2018   Procedure: ENDOSCOPIC RETROGRADE CHOLANGIOPANCREATOGRAPHY (ERCP) With sphincterotomy and stent placement;  Surgeon: Rogene Houston, MD;  [facility];  Service: Gastroenterology;  Laterality: N/A;  . ERCP N/A 05/27/2018   Procedure: ENDOSCOPIC RETROGRADE CHOLANGIOPANCREATOGRAPHY (ERCP);  Surgeon: Rogene Houston, MD;  [facility];  Service: Endoscopy;  Laterality: N/A;  . ERCP N/A 09/16/2018   Procedure: ENDOSCOPIC RETROGRADE CHOLANGIOPANCREATOGRAPHY (ERCP);  Surgeon: Rogene Houston, MD;  [facility];  Service: Endoscopy;  Laterality: N/A;  . GASTROINTESTINAL STENT REMOVAL N/A 05/27/2018   Procedure: Biliary STENT REMOVAL;  Surgeon: Rogene Houston, MD;  [facility];  Service: Endoscopy;  Laterality: N/A;  . GASTROINTESTINAL STENT REMOVAL N/A 09/16/2018   Procedure: GASTROINTESTINAL STENT REMOVAL;  Surgeon: Rogene Houston, MD;  [facility];  Service: Endoscopy;  Laterality: N/A;  . kidney stones left x2  1975  . KIDNEY SURGERY     Ruptured left kidney 30 yrs ago  from a kidney stone  . LITHOTRIPSY N/A 05/27/2018   Procedure: MECHANICAL LITHOTRIPSY;  Surgeon: Rogene Houston, MD;  [facility];  Service: Endoscopy;  Laterality: N/A;  . REMOVAL OF STONES N/A 09/16/2018   Procedure: REMOVAL OF MULTIPLE STONES WITH BASKET AND BALLOON;  Surgeon: Rogene Houston, MD;  [facility];  Service: Endoscopy;  Laterality: N/A;  . SPHINCTEROTOMY N/A  05/27/2018   Procedure: SPHINCTEROTOMY extended;  Surgeon: Rogene Houston, MD;  [facility];  Service: Endoscopy;  Laterality: N/A;  . SPYGLASS CHOLANGIOSCOPY N/A 05/27/2018   Procedure: HENIDPOE CHOLANGIOSCOPY;  Surgeon: Rogene Houston, MD;  [facility];  Service: Endoscopy;  Laterality: N/A;    Allergies  Allergen Reactions  . Bayer Advanced Aspirin [Aspirin] Nausea And Vomiting  . Penicillins  Nausea And Vomiting    Has patient had a PCN reaction causing immediate rash, facial/tongue/throat swelling, SOB or lightheadedness with hypotension: unknown Has patient had a PCN reaction causing severe rash involving mucus membranes or skin necrosis: unknown Has patient had a PCN reaction that required hospitalization: unknown Has patient had a PCN reaction occurring within the last 10 years: no If all of the above answers are "NO", then may proceed with Cephalosporin use.    Current Outpatient Medications on File Prior to Visit  Medication Sig Dispense Refill  . amLODipine (NORVASC) 10 MG tablet TAKE 1 TABLET BY MOUTH ONCE DAILY 90 tablet 0  . aspirin EC 81 MG tablet Take 1 tablet (81 mg total) by mouth daily. 150 tablet 2  . calcium carbonate (TUMS - DOSED IN MG ELEMENTAL CALCIUM) 500 MG chewable tablet Chew 1 tablet by mouth 2 (two) times daily. For Bone Health    . clopidogrel (PLAVIX) 75 MG tablet Take 1 tablet (75 mg total) by mouth daily. 30 tablet 1  . Insulin Isophane & Regular Human (NOVOLIN 70/30 FLEXPEN RELION) (70-30) 100 UNIT/ML PEN Inject 10 Units into the skin 2 (two) times daily before a meal. 5 pen 2  . levETIRAcetam (KEPPRA) 500 MG tablet Take 250 mg by mouth at bedtime.     Marland Kitchen levothyroxine (SYNTHROID, LEVOTHROID) 112 MCG tablet Take 1 tablet (112 mcg total) by mouth daily before breakfast. 90 tablet 3  . Multiple Vitamins-Minerals (MULTIVITAMIN WITH MINERALS) tablet Take 1 tablet by mouth daily.    Marland Kitchen olopatadine (PATANOL) 0.1 % ophthalmic solution INSTILL 1 DROP INTO EACH EYE TWICE DAILY (Patient taking differently: Place 1 drop into both eyes 2 (two) times daily. ) 5 mL 12  . ONE TOUCH ULTRA TEST test strip USE 1 STRIP TO CHECK GLUCOSE TWICE DAILY 100 each 5  . ONETOUCH DELICA LANCETS 42P MISC     . pravastatin (PRAVACHOL) 20 MG tablet Take 1 tablet (20 mg total) by mouth daily. 90 tablet 3  . tamsulosin (FLOMAX) 0.4 MG CAPS capsule Take 0.4 mg by mouth daily.     .  Vitamin D, Ergocalciferol, (DRISDOL) 50000 units CAPS capsule TAKE 1 CAPSULE BY MOUTH ONCE A WEEK (Patient taking differently: Take 50,000 Units by mouth every 7 (seven) days. On Sunday) 12 capsule 1   No current facility-administered medications on file prior to visit.      Objective:   Physical Exam Blood pressure (!) 160/78, pulse 72, temperature (!) 97 F (36.1 C), height 5\' 8"  (1.727 m), weight 172 lb 3.2 oz (78.1 kg). Alert and oriented. Skin warm and dry. Oral mucosa is moist.   . Sclera anicteric, conjunctivae is pink. Thyroid not enlarged. No cervical lymphadenopathy. Lungs clear. Heart regular rate and rhythm.  Abdomen is soft. Bowel sounds are positive. No hepatomegaly. No abdominal masses felt. No tenderness.  No edema to lower extremities.     Patient examined from w/c. Patient is very unsteady standing.       Assessment & Plan:  Hx of CBD stone. ERCP with stent removal in October. Hepatic  is pending. He is doing good.

## 2018-11-16 NOTE — Patient Instructions (Signed)
OV in 6 months. 

## 2018-11-17 LAB — HEPATIC FUNCTION PANEL
AG Ratio: 1.1 (calc) (ref 1.0–2.5)
ALT: 13 U/L (ref 9–46)
AST: 22 U/L (ref 10–35)
Albumin: 4 g/dL (ref 3.6–5.1)
Alkaline phosphatase (APISO): 87 U/L (ref 40–115)
Bilirubin, Direct: 0.1 mg/dL (ref 0.0–0.2)
Globulin: 3.5 g/dL (calc) (ref 1.9–3.7)
Indirect Bilirubin: 0.3 mg/dL (calc) (ref 0.2–1.2)
Total Bilirubin: 0.4 mg/dL (ref 0.2–1.2)
Total Protein: 7.5 g/dL (ref 6.1–8.1)

## 2018-11-22 DIAGNOSIS — D631 Anemia in chronic kidney disease: Secondary | ICD-10-CM | POA: Diagnosis not present

## 2018-11-22 DIAGNOSIS — E889 Metabolic disorder, unspecified: Secondary | ICD-10-CM | POA: Diagnosis not present

## 2018-11-22 DIAGNOSIS — I10 Essential (primary) hypertension: Secondary | ICD-10-CM | POA: Diagnosis not present

## 2018-11-22 DIAGNOSIS — N184 Chronic kidney disease, stage 4 (severe): Secondary | ICD-10-CM | POA: Diagnosis not present

## 2018-11-22 DIAGNOSIS — M9080 Osteopathy in diseases classified elsewhere, unspecified site: Secondary | ICD-10-CM | POA: Diagnosis not present

## 2018-11-24 ENCOUNTER — Other Ambulatory Visit: Payer: Self-pay | Admitting: Family Medicine

## 2018-11-24 ENCOUNTER — Telehealth: Payer: Self-pay

## 2018-11-24 MED ORDER — BENZONATATE 100 MG PO CAPS: 100.0000 mg | ORAL_CAPSULE | Freq: Two times a day (BID) | ORAL | 0 refills | Status: DC | PRN

## 2018-11-24 NOTE — Progress Notes (Signed)
Tesalon perle

## 2018-11-24 NOTE — Telephone Encounter (Signed)
Wife called and states that Chase [name] is coughing and "spitting up mucous" reports that this has been going on for months now. Denies fever, sore throat, or nasal congestion. Wants to know if this could be allergies. Please advise

## 2018-11-24 NOTE — Telephone Encounter (Signed)
Pls let her  Know that I have prescribed tessalon perles for cough and congestion, if he worsens before visit scheduled in January, will need urgent care or ED visit, otherwise will see him then, hopefully he will have  improved by then, pls let her know

## 2018-12-01 ENCOUNTER — Other Ambulatory Visit: Payer: Self-pay | Admitting: Family Medicine

## 2018-12-17 ENCOUNTER — Other Ambulatory Visit: Payer: Self-pay | Admitting: "Endocrinology

## 2018-12-20 ENCOUNTER — Ambulatory Visit (INDEPENDENT_AMBULATORY_CARE_PROVIDER_SITE_OTHER): Payer: Medicare Other | Admitting: Family Medicine

## 2018-12-20 ENCOUNTER — Encounter: Payer: Self-pay | Admitting: Family Medicine

## 2018-12-20 VITALS — BP 132/72 | HR 90 | Resp 14 | Ht 68.0 in | Wt 173.0 lb

## 2018-12-20 DIAGNOSIS — E1121 Type 2 diabetes mellitus with diabetic nephropathy: Secondary | ICD-10-CM

## 2018-12-20 DIAGNOSIS — L309 Dermatitis, unspecified: Secondary | ICD-10-CM

## 2018-12-20 DIAGNOSIS — I119 Hypertensive heart disease without heart failure: Secondary | ICD-10-CM

## 2018-12-20 DIAGNOSIS — Z794 Long term (current) use of insulin: Secondary | ICD-10-CM

## 2018-12-20 DIAGNOSIS — I10 Essential (primary) hypertension: Secondary | ICD-10-CM | POA: Diagnosis not present

## 2018-12-20 DIAGNOSIS — G40209 Localization-related (focal) (partial) symptomatic epilepsy and epileptic syndromes with complex partial seizures, not intractable, without status epilepticus: Secondary | ICD-10-CM | POA: Diagnosis not present

## 2018-12-20 DIAGNOSIS — E782 Mixed hyperlipidemia: Secondary | ICD-10-CM

## 2018-12-20 DIAGNOSIS — E038 Other specified hypothyroidism: Secondary | ICD-10-CM

## 2018-12-20 NOTE — Patient Instructions (Signed)
F/u end July, call if you need me sooner  Need to examine feet every day for cuts or sores since you do not have any feeling under your feet  Blood pressure is excellent  Urine to be sent for microalb,past due  Needs fasting lipid, cmp and eGFR, HBA1C and TSH free T4 send copy to Dr Dorris Fetch also pls(OK tro drink orange juice or sweetened lemonade the morning of the blood draw)  Careful not to fall, and try to start helping in the home!  Thank you  for choosing [facility]. We consider it a privelige to serve you.  Delivering excellent health care in a caring and  compassionate way is our goal.  Partnering with you,  so that together we can achieve this goal is our strategy.

## 2018-12-21 ENCOUNTER — Other Ambulatory Visit (HOSPITAL_COMMUNITY)
Admission: RE | Admit: 2018-12-21 | Discharge: 2018-12-21 | Disposition: A | Discharge status: Home / Self Care | Payer: Medicare Other | Source: Ambulatory Visit | Attending: Family Medicine | Admitting: Family Medicine

## 2018-12-21 DIAGNOSIS — E1021 Type 1 diabetes mellitus with diabetic nephropathy: Secondary | ICD-10-CM | POA: Insufficient documentation

## 2018-12-22 LAB — MICROALBUMIN / CREATININE URINE RATIO
Creatinine, Urine: 182 mg/dL
Microalb Creat Ratio: 269.9 mg/g creat — ABNORMAL HIGH (ref 0.0–30.0)
Microalb, Ur: 491.2 ug/mL — ABNORMAL HIGH

## 2018-12-29 ENCOUNTER — Other Ambulatory Visit: Payer: Self-pay | Admitting: Family Medicine

## 2019-01-01 ENCOUNTER — Encounter: Payer: Self-pay | Admitting: Family Medicine

## 2019-01-01 NOTE — Assessment & Plan Note (Signed)
Chase [name] is reminded of the importance of commitment to daily physical activity for 30 minutes or more, as able and the need to limit carbohydrate intake to 30 to 60 grams per meal to help with blood sugar control.   The need to take medication as prescribed, test blood sugar as directed, and to call between visits if there is a concern that blood sugar is uncontrolled is also discussed.   Chase [name] is reminded of the importance of daily foot exam, annual eye examination, and good blood sugar, blood pressure and cholesterol control.  Diabetic Labs Latest Ref Rng & Units 12/21/2018 09/14/2018 06/02/2018 05/27/2018 03/03/2018  HbA1c <5.7 % of total Hgb - - 7.5(H) - -  Microalbumin Not Estab. ug/mL 491.2(H) - - - -  Micro/Creat Ratio 0.0 - 30.0 mg/g creat 269.9(H) - - - -  Chol <200 mg/dL - - 212(H) - -  HDL >40 mg/dL - - 67 - -  Calc LDL mg/dL (calc) - - 126(H) - -  Triglycerides <150 mg/dL - - 93 - -  Creatinine 0.61 - 1.24 mg/dL - 3.65(H) 3.56(H) 3.48(H) 2.74(H)   BP/Weight 12/20/2018 11/16/2018 09/26/2018 09/16/2018 09/15/2018 09/14/2018 5/53/7482  Systolic BP 707 867 544 920 100 712 197  Diastolic BP 72 78 67 72 78 68 80  Wt. (Lbs) 173 172.2 172 - 174.2 180 172.7  BMI 26.3 26.18 26.15 - 26.49 27.37 26.26   Foot/eye exam completion dates Latest Ref Rng & Units 12/20/2018 02/25/2017  Eye Exam No Retinopathy - -  Foot exam Order - - -  Foot Form Completion - Done Done

## 2019-01-01 NOTE — Progress Notes (Signed)
Chase [name]     MRN: [id number]      DOB: 1931/10/06   HPI Chase [name] is here for follow up and re-evaluation of chronic medical conditions, medication management and review of any available recent lab and radiology data.  Preventive health is updated, specifically  Cancer screening and Immunization.   Questions or concerns regarding consultations or procedures which the PT has had in the interim are  addressed. The PT denies any adverse reactions to current medications since the last visit.  There are no new concerns.  There are no specific complaints  Denies polyuria, polydipsia, blurred vision , or hypoglycemic episodes.   ROS Denies recent fever or chills. Denies sinus pressure, nasal congestion, ear pain or sore throat. Denies chest congestion, productive cough or wheezing. Denies chest pains, palpitations and leg swelling Denies abdominal pain, nausea, vomiting,diarrhea or constipation.   Denies dysuria, frequency, hesitancy or incontinence. Denies uncontrolled  joint pain,  And c/o  limitation in mobility. Denies headaches, seizures, c/o chronic numbness, and  tingling. Denies depression, anxiety or insomnia. Denies skin break down or rash.   PE  BP 132/72   Pulse 90   Resp 14   Ht 5\' 8"  (1.727 m)   Wt 173 lb (78.5 kg)   SpO2 98%   BMI 26.30 kg/m    Patient alert and oriented and in no cardiopulmonary distress.  HEENT: No facial asymmetry, EOMI,   oropharynx pink and moist.  Neck decreased ROM no JVD, no mass.  Chest: Clear to auscultation bilaterally.  CVS: S1, S2 no murmurs, no S3.Regular rate.  ABD: Soft non tender.   Ext: No edema  MS: decreased  ROM spine, shoulders, hips and knees.  Skin: Intact, no ulcerations or rash noted.  Psych: Good eye contact, normal affect.t not anxious or depressed appearing.  CNS: CN 2-12 intact, power,  normal throughout.no focal deficits noted.   Assessment & Plan  Essential hypertension, benign Controlled, no  change in medication DASH diet and commitment to daily physical activity for a minimum of 30 minutes discussed and encouraged, as a part of hypertension management. The importance of attaining a healthy weight is also discussed.  BP/Weight 12/20/2018 11/16/2018 09/26/2018 09/16/2018 09/15/2018 09/14/2018 5/45/6256  Systolic BP 389 373 428 768 115 726 203  Diastolic BP 72 78 67 72 78 68 80  Wt. (Lbs) 173 172.2 172 - 174.2 180 172.7  BMI 26.3 26.18 26.15 - 26.49 27.37 26.26       Mixed hyperlipidemia Hyperlipidemia:Low fat diet discussed and encouraged.   Lipid Panel  Lab Results  Component Value Date   CHOL 212 (H) 06/02/2018   HDL 67 06/02/2018   LDLCALC 126 (H) 06/02/2018   TRIG 93 06/02/2018   CHOLHDL 3.2 06/02/2018   Uncontrolled, needs to reduce fat in diet Updated lab needed at/ before next visit.    Hypothyroidism Controlled and manged by endo  Seizure disorder, complex partial ([place]) Controlled on current medication, continue same  Controlled diabetes mellitus with diabetic nephropathy, with long-term current use of insulin [facility]) Chase [name] is reminded of the importance of commitment to daily physical activity for 30 minutes or more, as able and the need to limit carbohydrate intake to 30 to 60 grams per meal to help with blood sugar control.   The need to take medication as prescribed, test blood sugar as directed, and to call between visits if there is a concern that blood sugar is uncontrolled is also discussed.   Chase [name] is reminded of the importance of daily foot exam, annual eye examination, and good blood sugar, blood pressure and cholesterol control.  Diabetic Labs Latest Ref Rng & Units 12/21/2018 09/14/2018 06/02/2018 05/27/2018 03/03/2018  HbA1c <5.7 % of total Hgb - - 7.5(H) - -  Microalbumin Not Estab. ug/mL 491.2(H) - - - -  Micro/Creat Ratio 0.0 - 30.0 mg/g creat 269.9(H) - - - -  Chol <200 mg/dL - - 212(H) - -  HDL >40 mg/dL - - 67 - -  Calc LDL  mg/dL (calc) - - 126(H) - -  Triglycerides <150 mg/dL - - 93 - -  Creatinine 0.61 - 1.24 mg/dL - 3.65(H) 3.56(H) 3.48(H) 2.74(H)   BP/Weight 12/20/2018 11/16/2018 09/26/2018 09/16/2018 09/15/2018 09/14/2018 8/34/3735  Systolic BP 789 784 784 128 208 138 871  Diastolic BP 72 78 67 72 78 68 80  Wt. (Lbs) 173 172.2 172 - 174.2 180 172.7  BMI 26.3 26.18 26.15 - 26.49 27.37 26.26   Foot/eye exam completion dates Latest Ref Rng & Units 12/20/2018 02/25/2017  Eye Exam No Retinopathy - -  Foot exam Order - - -  Foot Form Completion - Done Done        Dermatitis No current flare, excellent response to topical steroids, continue same

## 2019-01-01 NOTE — Assessment & Plan Note (Signed)
Controlled, no change in medication DASH diet and commitment to daily physical activity for a minimum of 30 minutes discussed and encouraged, as a part of hypertension management. The importance of attaining a healthy weight is also discussed.  BP/Weight 12/20/2018 11/16/2018 09/26/2018 09/16/2018 09/15/2018 09/14/2018 08/31/4627  Systolic BP 638 177 116 579 038 333 832  Diastolic BP 72 78 67 72 78 68 80  Wt. (Lbs) 173 172.2 172 - 174.2 180 172.7  BMI 26.3 26.18 26.15 - 26.49 27.37 26.26

## 2019-01-01 NOTE — Assessment & Plan Note (Signed)
Hyperlipidemia:Low fat diet discussed and encouraged.   Lipid Panel  Lab Results  Component Value Date   CHOL 212 (H) 06/02/2018   HDL 67 06/02/2018   LDLCALC 126 (H) 06/02/2018   TRIG 93 06/02/2018   CHOLHDL 3.2 06/02/2018   Uncontrolled, needs to reduce fat in diet Updated lab needed at/ before next visit.

## 2019-01-01 NOTE — Assessment & Plan Note (Signed)
Controlled on current medication, continue same 

## 2019-01-01 NOTE — Assessment & Plan Note (Signed)
No current flare, excellent response to topical steroids, continue same

## 2019-01-01 NOTE — Assessment & Plan Note (Signed)
Controlled and manged by endo

## 2019-01-27 ENCOUNTER — Other Ambulatory Visit: Payer: Self-pay | Admitting: Family Medicine

## 2019-02-03 DIAGNOSIS — B351 Tinea unguium: Secondary | ICD-10-CM | POA: Diagnosis not present

## 2019-02-03 DIAGNOSIS — E1142 Type 2 diabetes mellitus with diabetic polyneuropathy: Secondary | ICD-10-CM | POA: Diagnosis not present

## 2019-02-15 ENCOUNTER — Ambulatory Visit: Payer: Medicare Other | Admitting: "Endocrinology

## 2019-02-24 ENCOUNTER — Telehealth: Payer: Self-pay

## 2019-02-24 NOTE — Telephone Encounter (Signed)
Excellent advice, thank you

## 2019-02-24 NOTE — Telephone Encounter (Signed)
FYI: Patients wife called stating he is having diarrhea. No other symptoms. Patient is feeling well other than the diarrhea. Eating and drinking fine. Advised her to try immodium for him. She is concerned with going out. Told her to go to a pharmacy that has a drive thru pharmacy and ask them to for a box of Immodium. Let her know I would send you a message letting you know about the phone call and what we had discussed.

## 2019-03-01 ENCOUNTER — Ambulatory Visit: Payer: Medicare Other | Admitting: "Endocrinology

## 2019-03-20 ENCOUNTER — Other Ambulatory Visit: Payer: Self-pay | Admitting: "Endocrinology

## 2019-03-24 ENCOUNTER — Other Ambulatory Visit: Payer: Self-pay

## 2019-03-24 ENCOUNTER — Telehealth: Payer: Self-pay | Admitting: Family Medicine

## 2019-03-24 MED ORDER — AMLODIPINE BESYLATE 10 MG PO TABS: 10.0000 mg | ORAL_TABLET | Freq: Every day | ORAL | 1 refills | Status: DC

## 2019-03-24 MED ORDER — PRAVASTATIN SODIUM 20 MG PO TABS: 20.0000 mg | ORAL_TABLET | Freq: Every day | ORAL | 3 refills | Status: DC

## 2019-03-24 MED ORDER — OLOPATADINE HCL 0.1 % OP SOLN: OPHTHALMIC | 12 refills | Status: DC

## 2019-03-24 MED ORDER — CLOPIDOGREL BISULFATE 75 MG PO TABS: 75.0000 mg | ORAL_TABLET | Freq: Every day | ORAL | 1 refills | Status: DC

## 2019-03-24 MED ORDER — VITAMIN D (ERGOCALCIFEROL) 1.25 MG (50000 UNIT) PO CAPS: 50000.0000 [IU] | ORAL_CAPSULE | ORAL | 1 refills | Status: DC

## 2019-03-24 NOTE — Telephone Encounter (Signed)
Please send all RX for PT Samie Reasons to Mail Order

## 2019-03-24 NOTE — Telephone Encounter (Signed)
[facility]

## 2019-03-27 ENCOUNTER — Other Ambulatory Visit: Payer: Self-pay | Admitting: Family Medicine

## 2019-03-28 ENCOUNTER — Encounter: Payer: Self-pay | Admitting: "Endocrinology

## 2019-03-28 ENCOUNTER — Other Ambulatory Visit: Payer: Self-pay

## 2019-03-28 MED ORDER — INSULIN ISOPHANE & REGULAR (HUMAN 70-30)100 UNIT/ML KWIKPEN: PEN_INJECTOR | SUBCUTANEOUS | 2 refills | Status: DC

## 2019-03-29 ENCOUNTER — Other Ambulatory Visit: Payer: Self-pay

## 2019-03-29 ENCOUNTER — Ambulatory Visit (INDEPENDENT_AMBULATORY_CARE_PROVIDER_SITE_OTHER): Payer: Medicare Other | Admitting: "Endocrinology

## 2019-03-29 ENCOUNTER — Encounter: Payer: Self-pay | Admitting: "Endocrinology

## 2019-03-29 DIAGNOSIS — N184 Chronic kidney disease, stage 4 (severe): Secondary | ICD-10-CM

## 2019-03-29 DIAGNOSIS — IMO0002 Reserved for concepts with insufficient information to code with codable children: Secondary | ICD-10-CM

## 2019-03-29 DIAGNOSIS — Z794 Long term (current) use of insulin: Secondary | ICD-10-CM | POA: Diagnosis not present

## 2019-03-29 DIAGNOSIS — E1165 Type 2 diabetes mellitus with hyperglycemia: Secondary | ICD-10-CM | POA: Diagnosis not present

## 2019-03-29 DIAGNOSIS — E1122 Type 2 diabetes mellitus with diabetic chronic kidney disease: Secondary | ICD-10-CM

## 2019-03-29 DIAGNOSIS — E038 Other specified hypothyroidism: Secondary | ICD-10-CM

## 2019-03-29 DIAGNOSIS — E782 Mixed hyperlipidemia: Secondary | ICD-10-CM

## 2019-03-29 NOTE — Progress Notes (Signed)
Endocrinology Telehealth Visit Follow up Note -During COVID -19 Pandemic  This visit type was conducted due to national recommendations for restrictions regarding the COVID-19 Pandemic  in an effort to limit this patient's exposure and mitigate transmission of the corona virus.  Due to his co-morbid illnesses, this patient is at  moderate to high risk for complications without adequate follow up.  This format is felt to be most appropriate for this patient at this time.  All issues noted in this document were discussed and addressed. The format was not optimal for physical exam.  Patient has verbally consented to this visit, and please refer to the patient's chart for details of arrangement for this telehealth for [facility] .    Subjective:    Patient ID: Chase [name], male    DOB: 01-Jul-1931, PCP [name], MD   Past Medical History:  Diagnosis Date  . Bradycardia 03/15/2012  . Carotid artery occlusion   . Choledocholithiasis 02/25/2018  . CKD (chronic kidney disease) stage 4, GFR 15-29 ml/min (HCC)   . Complete lesion of cervical spinal cord ([place]) 03/14/3012   Stable since 2006  . CVA (cerebrovascular accident) ([place]) 09/07/12   right sided weakness  . Depressive disorder, not elsewhere classified   . Diabetes mellitus approx 1994  . Diabetic neuropathy ([place])   . History of kidney stones   . Hypertensive heart disease   . Hypothyroidism approx 2000  . Lacunar stroke, acute ([place]) 03/14/2012  . Obesity   . Osteoarthrosis, unspecified whether generalized or localized, lower leg   . Other and unspecified hyperlipidemia   . Peripheral vascular disease, unspecified ([place])   . Seizures ([place])    unknown etiology; on meds, last seizure was 2015  . Spinal stenosis, unspecified region other than cervical   . Spondylosis of unspecified site without mention of myelopathy    Past Surgical History:  Procedure Laterality Date  . BILIARY STENT  PLACEMENT N/A 05/27/2018   Procedure: BILIARY STENT PLACEMENT;  Surgeon: Rogene Houston, MD;  [facility];  Service: Endoscopy;  Laterality: N/A;  . COLONOSCOPY N/A 08/10/2013   Procedure: COLONOSCOPY;  Surgeon: Rogene Houston, MD;  [facility];  Service: Endoscopy;  Laterality: N/A;  240  . ERCP N/A 03/02/2018   Procedure: ENDOSCOPIC RETROGRADE CHOLANGIOPANCREATOGRAPHY (ERCP) With sphincterotomy and stent placement;  Surgeon: Rogene Houston, MD;  [facility];  Service: Gastroenterology;  Laterality: N/A;  . ERCP N/A 05/27/2018   Procedure: ENDOSCOPIC RETROGRADE CHOLANGIOPANCREATOGRAPHY (ERCP);  Surgeon: Rogene Houston, MD;  [facility];  Service: Endoscopy;  Laterality: N/A;  . ERCP N/A 09/16/2018   Procedure: ENDOSCOPIC RETROGRADE CHOLANGIOPANCREATOGRAPHY (ERCP);  Surgeon: Rogene Houston, MD;  [facility];  Service: Endoscopy;  Laterality: N/A;  . GASTROINTESTINAL STENT REMOVAL N/A 05/27/2018   Procedure: Biliary STENT REMOVAL;  Surgeon: Rogene Houston, MD;  [facility];  Service: Endoscopy;  Laterality: N/A;  . GASTROINTESTINAL STENT REMOVAL N/A 09/16/2018   Procedure: GASTROINTESTINAL STENT REMOVAL;  Surgeon: Rogene Houston, MD;  [facility];  Service: Endoscopy;  Laterality: N/A;  . kidney stones left x2  1975  .  KIDNEY SURGERY     Ruptured left kidney 30 yrs ago  from a kidney stone  . LITHOTRIPSY N/A 05/27/2018   Procedure: MECHANICAL LITHOTRIPSY;  Surgeon: Rogene Houston, MD;  [facility];  Service: Endoscopy;  Laterality: N/A;  . REMOVAL OF STONES N/A 09/16/2018   Procedure: REMOVAL OF MULTIPLE STONES WITH BASKET AND BALLOON;  Surgeon: Rogene Houston, MD;  [facility];  Service: Endoscopy;  Laterality: N/A;  . SPHINCTEROTOMY N/A 05/27/2018   Procedure: SPHINCTEROTOMY extended;  Surgeon: Rogene Houston, MD;  [facility];  Service: Endoscopy;  Laterality:  N/A;  . SPYGLASS CHOLANGIOSCOPY N/A 05/27/2018   Procedure: TIRWERXV CHOLANGIOSCOPY;  Surgeon: Rogene Houston, MD;  [facility];  Service: Endoscopy;  Laterality: N/A;   Social History   Socioeconomic History  . Marital status: Married    Spouse name: Not on file  . Number of children: 5  . Years of education: 8  . Highest education level: 8th grade  Occupational History  . Occupation: retired     Fish farm manager: RETIRED  Social Needs  . Financial resource strain: Somewhat hard  . Food insecurity:    Worry: Never true    Inability: Never true  . Transportation needs:    Medical: No    Non-medical: No  Tobacco Use  . Smoking status: Never Smoker  . Smokeless tobacco: Never Used  Substance and Sexual Activity  . Alcohol use: No    Alcohol/week: 0.0 standard drinks  . Drug use: No  . Sexual activity: Not Currently  Lifestyle  . Physical activity:    Days per week: 0 days    Minutes per session: 0 min  . Stress: Not at all  Relationships  . Social connections:    Talks on phone: Three times a week    Gets together: Twice a week    Attends religious service: Never    Active member of club or organization: No    Attends meetings of clubs or organizations: Not on file    Relationship status: Married  Other Topics Concern  . Not on file  Social History Narrative  . Not on file   Outpatient Encounter Medications as of 03/29/2019  Medication Sig  . amLODipine (NORVASC) 10 MG tablet Take 1 tablet by mouth once daily  . aspirin EC 81 MG tablet Take 1 tablet (81 mg total) by mouth daily.  . benzonatate (TESSALON) 100 MG capsule Take 1 capsule (100 mg total) by mouth 2 (two) times daily as needed for cough.  . calcium carbonate (TUMS - DOSED IN MG ELEMENTAL CALCIUM) 500 MG chewable tablet Chew 1 tablet by mouth 2 (two) times daily. For Bone Health  . clobetasol cream (TEMOVATE) 4.00 % Apply 1 application topically as directed.  . clopidogrel (PLAVIX) 75 MG tablet Take  1 tablet by mouth once daily  . desonide (DESOWEN) 0.05 % cream Apply 1 application topically as directed.  . Insulin Isophane & Regular Human (NOVOLIN 70/30 FLEXPEN RELION) (70-30) 100 UNIT/ML PEN INJECT 10 UNITS SUBCUTANEOUSLY TWICE DAILY BEFORE  A  MEAL  . levETIRAcetam (KEPPRA) 500 MG tablet Take 250 mg by mouth at bedtime.   Marland Kitchen levothyroxine (SYNTHROID, LEVOTHROID) 112 MCG tablet Take 1 tablet (112 mcg total) by mouth daily before breakfast.  . Multiple Vitamins-Minerals (MULTIVITAMIN WITH MINERALS) tablet Take 1 tablet by mouth daily.  Marland Kitchen olopatadine (PATANOL) 0.1 % ophthalmic solution INSTILL 1 DROP INTO EACH EYE TWICE DAILY  .  ONE TOUCH ULTRA TEST test strip USE 1 STRIP TO CHECK GLUCOSE TWICE DAILY  . ONETOUCH DELICA LANCETS 32K MISC   . pravastatin (PRAVACHOL) 20 MG tablet Take 1 tablet (20 mg total) by mouth daily.  . tamsulosin (FLOMAX) 0.4 MG CAPS capsule Take 0.4 mg by mouth daily.   . Vitamin D, Ergocalciferol, (DRISDOL) 1.25 MG (50000 UT) CAPS capsule Take 1 capsule (50,000 Units total) by mouth once a week.   No facility-administered encounter medications on file as of 03/29/2019.    ALLERGIES: Allergies  Allergen Reactions  . Bayer Advanced Aspirin [Aspirin] Nausea And Vomiting  . Penicillins Nausea And Vomiting    Has patient had a PCN reaction causing immediate rash, facial/tongue/throat swelling, SOB or lightheadedness with hypotension: unknown Has patient had a PCN reaction causing severe rash involving mucus membranes or skin necrosis: unknown Has patient had a PCN reaction that required hospitalization: unknown Has patient had a PCN reaction occurring within the last 10 years: no If all of the above answers are "NO", then may proceed with Cephalosporin use.   VACCINATION STATUS: Immunization History  Administered Date(s) Administered  . H1N1 11/14/2008  . Influenza Split 10/07/2011, 09/08/2012  . Influenza Whole 08/22/2007, 08/27/2010  . Influenza,inj,Quad PF,6+  Mos 08/22/2013, 09/26/2014, 09/26/2015, 09/01/2016, 09/20/2017, 09/26/2018  . Pneumococcal Conjugate-13 06/12/2014  . Pneumococcal Polysaccharide-23 05/21/2004  . Td 05/21/2004  . Tdap 10/07/2011    Diabetes  He presents for his follow-up diabetic visit. He has type 2 diabetes mellitus. Onset time: He was diagnosed at approximate age of 3 years. His disease course has been stable. There are no hypoglycemic associated symptoms. Pertinent negatives for hypoglycemia include no confusion, headaches, pallor or seizures. There are no diabetic associated symptoms. Pertinent negatives for diabetes include no chest pain, no fatigue, no polydipsia, no polyphagia, no polyuria and no weakness. There are no hypoglycemic complications. Symptoms are stable. Diabetic complications include nephropathy and PVD. Risk factors for coronary artery disease include diabetes mellitus, dyslipidemia, male sex, obesity and sedentary lifestyle. Current diabetic treatment includes insulin injections. He is compliant with treatment most of the time. His weight is decreasing steadily. He is following a diabetic diet. When asked about meal planning, he reported none. He never participates in exercise. His home blood glucose trend is decreasing steadily. His breakfast blood glucose range is generally 140-180 mg/dl. His dinner blood glucose range is generally 180-200 mg/dl. His bedtime blood glucose range is generally >200 mg/dl. His overall blood glucose range is 180-200 mg/dl.  Hyperlipidemia  This is a chronic problem. The current episode started more than 1 year ago. Exacerbating diseases include diabetes and hypothyroidism. Pertinent negatives include no chest pain, myalgias or shortness of breath. Current antihyperlipidemic treatment includes statins. Risk factors for coronary artery disease include dyslipidemia, diabetes mellitus, hypertension, male sex and a sedentary lifestyle.  Hypertension  This is a chronic problem. The  current episode started more than 1 year ago. Pertinent negatives include no chest pain, headaches, neck pain, palpitations or shortness of breath. Risk factors for coronary artery disease include diabetes mellitus, dyslipidemia and sedentary lifestyle. Past treatments include direct vasodilators. Hypertensive end-organ damage includes PVD. Identifiable causes of hypertension include a thyroid problem.  Thyroid Problem  Presents for follow-up visit. Patient reports no constipation, diarrhea, fatigue or palpitations. The symptoms have been stable. Past treatments include levothyroxine. His past medical history is significant for diabetes and hyperlipidemia.   Review of systems: Limited as above.   Objective:    There were no vitals  taken for this visit.  Wt Readings from Last 3 Encounters:  12/20/18 173 lb (78.5 kg)  11/16/18 172 lb 3.2 oz (78.1 kg)  09/26/18 172 lb (78 kg)     CMP     Component Value Date/Time   NA 140 09/14/2018 1301   K 4.6 09/14/2018 1301   CL 111 09/14/2018 1301   CO2 23 09/14/2018 1301   GLUCOSE 187 (H) 09/14/2018 1301   BUN 43 (H) 09/14/2018 1301   CREATININE 3.65 (H) 09/14/2018 1301   CREATININE 3.56 (H) 06/02/2018 1338   CALCIUM 9.3 09/14/2018 1301   PROT 7.5 11/16/2018 1515   ALBUMIN 3.7 05/27/2018 1016   AST 22 11/16/2018 1515   ALT 13 11/16/2018 1515   ALKPHOS 118 05/27/2018 1016   BILITOT 0.4 11/16/2018 1515   GFRNONAA 14 (L) 09/14/2018 1301   GFRNONAA 15 (L) 06/02/2018 1338   GFRAA 16 (L) 09/14/2018 1301   GFRAA 17 (L) 06/02/2018 1338   Diabetic Labs (most recent): Lab Results  Component Value Date   HGBA1C 7.5 (H) 06/02/2018   HGBA1C 6.7 (H) 02/08/2018   HGBA1C 6.2 (H) 08/04/2017    Lipid Panel     Component Value Date/Time   CHOL 212 (H) 06/02/2018 1338   TRIG 93 06/02/2018 1338   HDL 67 06/02/2018 1338   CHOLHDL 3.2 06/02/2018 1338   VLDL 8 03/12/2016 1139   LDLCALC 126 (H) 06/02/2018 1338     Assessment & Plan:   1.  type 2  diabetes mellitus with stage 4 chronic kidney disease, with long-term current use of insulin (HCC) -his diabetes is  complicated by chronic kidney disease stage IV and patient remains at a high risk for more acute and chronic complications of diabetes which include CAD, CVA, CKD, retinopathy, and neuropathy. These are all discussed in detail with the patient.  -His  Wife who is his caretaker reports that his morning blood glucose readings are ranging between 104-163, before supper readings range from 193-212.  He did not have a chance to do his previsit labs.  His last visit A1c of 7.5% was acceptable target for him.   - I have re-counseled the patient on diet management   by adopting a carbohydrate restricted / protein rich  Diet. -He is advised to avoid excessively processed carbs including soda, ice cream, and sweet tea.   - Patient is advised to stick to a routine mealtimes to eat 3 meals  a day and avoid unnecessary snacks ( to snack only to correct hypoglycemia).  - I have approached patient with the following individualized plan to manage diabetes and patient agrees.  - I advised him and his wife who is the primary caregiver to continue 75/25  10 units twice a day with breakfast and supper when pre-meal blood glucose is above 90 mg/dL, associated with monitoring of blood glucose at least 2 times a day- before breakfast and before supper.     - Patient is warned not to take insulin without proper monitoring per orders. -Adjustment parameters are given for hypo and hyperglycemia in writing. -Patient is encouraged to call clinic for blood glucose levels less than 70 or above 300 mg /dl. -Patient is not a candidate for metformin,SGLT2 inhibitors due to CKD.    2) BP/HTN: he is advised to home monitor blood pressure and report if > 140/90 on 2 separate readings.  He is not on antihypertensive treatment at this time.   3) Lipids/HPL: His lipid panel has been stable  for 2 years.  He remains  on pravastatin 20 mg p.o. nightly.   4) hypothyroidism:   -I advised him to continue levothyroxine 112 mcg p.o. daily before breakfast.     - We discussed about the correct intake of his thyroid hormone, on empty stomach at fasting, with water, separated by at least 30 minutes from breakfast and other medications,  and separated by more than 4 hours from calcium, iron, multivitamins, acid reflux medications (PPIs). -Patient is made aware of the fact that thyroid hormone replacement is needed for life, dose to be adjusted by periodic monitoring of thyroid function tests. - I advised patient to maintain close follow up with [name], MD for primary care needs.  - Patient Care Time Today:  25 min, of which >50% was spent in reviewing his  current and  previous labs/studies, blood glucose readings, previous treatments, and medications doses and developing a plan for long-term care based on the latest recommendations for standards of care.  [name] participated in the discussions, expressed understanding, and voiced agreement with the above plans.  All questions were answered to his satisfaction. he is encouraged to contact clinic should he have any questions or concerns prior to his return visit.   Follow up plan: -Return in about 4 months (around 07/29/2019) for Follow up with Pre-visit Labs, Meter, and Logs.  Glade Lloyd, MD Phone: [phone]  Fax: [phone]  This note was partially dictated with voice recognition software. Similar sounding words can be transcribed inadequately or may not  be corrected upon review.  03/29/2019, 1:02 PM

## 2019-04-12 ENCOUNTER — Other Ambulatory Visit: Payer: Self-pay

## 2019-04-12 ENCOUNTER — Ambulatory Visit (INDEPENDENT_AMBULATORY_CARE_PROVIDER_SITE_OTHER): Payer: Medicare Other | Admitting: Family Medicine

## 2019-04-12 ENCOUNTER — Encounter: Payer: Self-pay | Admitting: Family Medicine

## 2019-04-12 VITALS — BP 132/72 | Ht 68.0 in | Wt 173.0 lb

## 2019-04-12 DIAGNOSIS — E038 Other specified hypothyroidism: Secondary | ICD-10-CM

## 2019-04-12 DIAGNOSIS — R262 Difficulty in walking, not elsewhere classified: Secondary | ICD-10-CM | POA: Diagnosis not present

## 2019-04-12 DIAGNOSIS — N184 Chronic kidney disease, stage 4 (severe): Secondary | ICD-10-CM

## 2019-04-12 DIAGNOSIS — M1712 Unilateral primary osteoarthritis, left knee: Secondary | ICD-10-CM

## 2019-04-12 DIAGNOSIS — E1121 Type 2 diabetes mellitus with diabetic nephropathy: Secondary | ICD-10-CM

## 2019-04-12 DIAGNOSIS — R197 Diarrhea, unspecified: Secondary | ICD-10-CM | POA: Diagnosis not present

## 2019-04-12 DIAGNOSIS — I10 Essential (primary) hypertension: Secondary | ICD-10-CM

## 2019-04-12 DIAGNOSIS — E782 Mixed hyperlipidemia: Secondary | ICD-10-CM

## 2019-04-12 DIAGNOSIS — Z794 Long term (current) use of insulin: Secondary | ICD-10-CM

## 2019-04-12 MED ORDER — AMLODIPINE BESYLATE 10 MG PO TABS: 10.0000 mg | ORAL_TABLET | Freq: Every day | ORAL | 1 refills | Status: DC

## 2019-04-12 MED ORDER — CLOPIDOGREL BISULFATE 75 MG PO TABS: 75.0000 mg | ORAL_TABLET | Freq: Every day | ORAL | 1 refills | Status: DC

## 2019-04-12 NOTE — Progress Notes (Signed)
Virtual Visit via Telephone Note  I connected with Chase [name] on 04/12/19 at  3:00 PM EDT by telephone and verified that I am speaking with the correct person using two identifiers.  Location: Patient: home  Provider: office   I discussed the limitations, risks, security and privacy concerns of performing an evaluation and management service by telephone and the availability of in person appointments. I also discussed with the patient that there may be a patient responsible charge related to this service. The patient expressed understanding and agreed to proceed. Wife and daughter present on call   History of Present Illness: C/o diarrhea, knee pain and reduced mobility, requests in home help for his wife as he is becoming increasingly unable Denies recent fever or chills. Denies sinus pressure, nasal congestion, ear pain or sore throat. Denies chest congestion, productive cough or wheezing. Denies chest pains, palpitations and leg swelling Denies abdominal pain, nausea, vomiting,diarrhea or constipation.   Denies dysuria, frequency, hesitancy or incontinence.  Denies headaches, seizures, numbness, or tingling. Denies depression, anxiety or insomnia. Denies skin break down or rash.       Observations/Objective:  BP 132/72   Ht 5\' 8"  (1.727 m)   Wt 173 lb (78.5 kg)   BMI 26.30 kg/m  /Good communication with no confusion and intact memory. Alert and oriented x 3 No signs of respiratory distress during sppech   Assessment and Plan:  Osteoarthritis of left knee Increased pain and stiffness, limiting mobility increasingly, not walking much, and incrased fall risk, refer for in home PT twice weekly x 4 to 6 weeks  Difficulty walking Worsened due to uncontrolled and increased left kneee pain and stiffness, advised against CBD , however referred to therapy  Essential hypertension, benign Controlled, no change in medication DASH diet and commitment to daily physical  activity for a minimum of 30 minutes discussed and encouraged, as a part of hypertension management. The importance of attaining a healthy weight is also discussed.  BP/Weight 04/12/2019 12/20/2018 11/16/2018 09/26/2018 09/16/2018 09/15/2018 47/05/5464  Systolic BP 035 465 681 275 170 017 494  Diastolic BP 72 72 78 67 72 78 68  Wt. (Lbs) 173 173 172.2 172 - 174.2 180  BMI 26.3 26.3 26.18 26.15 - 26.49 27.37       Mixed hyperlipidemia Hyperlipidemia:Low fat diet discussed and encouraged.   Lipid Panel  Lab Results  Component Value Date   CHOL 212 (H) 06/02/2018   HDL 67 06/02/2018   LDLCALC 126 (H) 06/02/2018   TRIG 93 06/02/2018   CHOLHDL 3.2 06/02/2018  Updated lab needed at/ before next visit. Uncontrolled when last checked     CKD (chronic kidney disease) stage 4, GFR 15-29 ml/min (HCC) Updated lab needed at/ before next visit. Need to control blood pressure and diabetes and avoid nephrotoxins is stressed  Hypothyroidism Managed by endo and controlled  Diabetes mellitus with diabetic nephropathy, with long-term current use of insulin [facility]) Chase [name] is reminded of the importance of commitment to daily physical activity for 30 minutes or more, as able and the need to limit carbohydrate intake to 30 to 60 grams per meal to help with blood sugar control.   The need to take medication as prescribed, test blood sugar as directed, and to call between visits if there is a concern that blood sugar is uncontrolled is also discussed.   Chase [name] is reminded of the importance of daily foot exam, annual eye examination, and good blood sugar, blood pressure and cholesterol  control. Updated lab needed at/ before next visit.   Diabetic Labs Latest Ref Rng & Units 12/21/2018 09/14/2018 06/02/2018 05/27/2018 03/03/2018  HbA1c <5.7 % of total Hgb - - 7.5(H) - -  Microalbumin Not Estab. ug/mL 491.2(H) - - - -  Micro/Creat Ratio 0.0 - 30.0 mg/g creat 269.9(H) - - - -  Chol <200 mg/dL - -  212(H) - -  HDL >40 mg/dL - - 67 - -  Calc LDL mg/dL (calc) - - 126(H) - -  Triglycerides <150 mg/dL - - 93 - -  Creatinine 0.61 - 1.24 mg/dL - 3.65(H) 3.56(H) 3.48(H) 2.74(H)   BP/Weight 04/12/2019 12/20/2018 11/16/2018 09/26/2018 09/16/2018 09/15/2018 92/0/1007  Systolic BP 121 975 883 254 982 641 583  Diastolic BP 72 72 78 67 72 78 68  Wt. (Lbs) 173 173 172.2 172 - 174.2 180  BMI 26.3 26.3 26.18 26.15 - 26.49 27.37   Foot/eye exam completion dates Latest Ref Rng & Units 12/20/2018 02/25/2017  Eye Exam No Retinopathy - -  Foot exam Order - - -  Foot Form Completion - Done Done        Diarrhea One day h/o loose frequent stool, advised to ensure adequate hydration, and will use imodium as needed  Follow Up Instructions:    I discussed the assessment and treatment plan with the patient. The patient was provided an opportunity to ask questions and all were answered. The patient agreed with the plan and demonstrated an understanding of the instructions.   The patient was advised to call back or seek an in-person evaluation if the symptoms worsen or if the condition fails to improve as anticipated.  I provided 22 minutes of non-face-to-face time during this encounter.   Tula Nakayama, MD

## 2019-04-12 NOTE — Patient Instructions (Addendum)
F/U in July as before , call if you need me sooner  You are being referred for in home help  3 days per week as requested, Nurse will contact you regarding details.  You are referred to out patient physical therapy, or in home  in [place] twice weekly for  6 weeks, due to reduced mobility and high fall risk  Use imodium for relief of your acute diarrhea, and wash hands often  I do not recommend CBD use   Please make an effort to get up more on a schedule and move around some more , so that you can enjoy quality time with your wife and children and grandchildren also,t hey do love you very much and want to see you up and about!  Thanks for choosing [facility], we consider it a privelige to serve you.

## 2019-04-14 ENCOUNTER — Telehealth: Payer: Self-pay | Admitting: *Deleted

## 2019-04-14 NOTE — Telephone Encounter (Signed)
FMLA papers received via mail for Chase [name]  Noted  Copied Sleeved

## 2019-04-15 ENCOUNTER — Telehealth: Payer: Self-pay | Admitting: Family Medicine

## 2019-04-15 ENCOUNTER — Other Ambulatory Visit: Payer: Self-pay | Admitting: Family Medicine

## 2019-04-15 ENCOUNTER — Encounter: Payer: Self-pay | Admitting: Family Medicine

## 2019-04-15 DIAGNOSIS — E1021 Type 1 diabetes mellitus with diabetic nephropathy: Secondary | ICD-10-CM

## 2019-04-15 DIAGNOSIS — R197 Diarrhea, unspecified: Secondary | ICD-10-CM | POA: Insufficient documentation

## 2019-04-15 DIAGNOSIS — E782 Mixed hyperlipidemia: Secondary | ICD-10-CM

## 2019-04-15 DIAGNOSIS — M1712 Unilateral primary osteoarthritis, left knee: Secondary | ICD-10-CM | POA: Insufficient documentation

## 2019-04-15 DIAGNOSIS — E559 Vitamin D deficiency, unspecified: Secondary | ICD-10-CM

## 2019-04-15 DIAGNOSIS — I150 Renovascular hypertension: Secondary | ICD-10-CM

## 2019-04-15 NOTE — Assessment & Plan Note (Signed)
Hyperlipidemia:Low fat diet discussed and encouraged.   Lipid Panel  Lab Results  Component Value Date   CHOL 212 (H) 06/02/2018   HDL 67 06/02/2018   LDLCALC 126 (H) 06/02/2018   TRIG 93 06/02/2018   CHOLHDL 3.2 06/02/2018  Updated lab needed at/ before next visit. Uncontrolled when last checked

## 2019-04-15 NOTE — Assessment & Plan Note (Signed)
Managed by endo and controlled 

## 2019-04-15 NOTE — Telephone Encounter (Signed)
pls let pt know he needs fasting lipid panel, cBC and vit D levels drawn asap/ with next lab and order, pls order I cannot refill vit D without updated lab As far as flomax,  I am removing it, if his urine stream is poor he needs to call and let me knoiw

## 2019-04-15 NOTE — Assessment & Plan Note (Signed)
One day h/o loose frequent stool, advised to ensure adequate hydration, and will use imodium as needed

## 2019-04-15 NOTE — Assessment & Plan Note (Addendum)
Chase [name] is reminded of the importance of commitment to daily physical activity for 30 minutes or more, as able and the need to limit carbohydrate intake to 30 to 60 grams per meal to help with blood sugar control.   The need to take medication as prescribed, test blood sugar as directed, and to call between visits if there is a concern that blood sugar is uncontrolled is also discussed.   Chase [name] is reminded of the importance of daily foot exam, annual eye examination, and good blood sugar, blood pressure and cholesterol control. Updated lab needed at/ before next visit.   Diabetic Labs Latest Ref Rng & Units 12/21/2018 09/14/2018 06/02/2018 05/27/2018 03/03/2018  HbA1c <5.7 % of total Hgb - - 7.5(H) - -  Microalbumin Not Estab. ug/mL 491.2(H) - - - -  Micro/Creat Ratio 0.0 - 30.0 mg/g creat 269.9(H) - - - -  Chol <200 mg/dL - - 212(H) - -  HDL >40 mg/dL - - 67 - -  Calc LDL mg/dL (calc) - - 126(H) - -  Triglycerides <150 mg/dL - - 93 - -  Creatinine 0.61 - 1.24 mg/dL - 3.65(H) 3.56(H) 3.48(H) 2.74(H)   BP/Weight 04/12/2019 12/20/2018 11/16/2018 09/26/2018 09/16/2018 09/15/2018 88/07/2799  Systolic BP 349 179 150 569 794 801 655  Diastolic BP 72 72 78 67 72 78 68  Wt. (Lbs) 173 173 172.2 172 - 174.2 180  BMI 26.3 26.3 26.18 26.15 - 26.49 27.37   Foot/eye exam completion dates Latest Ref Rng & Units 12/20/2018 02/25/2017  Eye Exam No Retinopathy - -  Foot exam Order - - -  Foot Form Completion - Done Done

## 2019-04-15 NOTE — Assessment & Plan Note (Signed)
Increased pain and stiffness, limiting mobility increasingly, not walking much, and incrased fall risk, refer for in home PT twice weekly x 4 to 6 weeks

## 2019-04-15 NOTE — Assessment & Plan Note (Signed)
Updated lab needed at/ before next visit. Need to control blood pressure and diabetes and avoid nephrotoxins is stressed

## 2019-04-15 NOTE — Assessment & Plan Note (Signed)
Worsened due to uncontrolled and increased left kneee pain and stiffness, advised against CBD , however referred to therapy

## 2019-04-15 NOTE — Assessment & Plan Note (Signed)
Controlled, no change in medication DASH diet and commitment to daily physical activity for a minimum of 30 minutes discussed and encouraged, as a part of hypertension management. The importance of attaining a healthy weight is also discussed.  BP/Weight 04/12/2019 12/20/2018 11/16/2018 09/26/2018 09/16/2018 09/15/2018 19/08/1443  Systolic BP 584 835 075 732 256 720 919  Diastolic BP 72 72 78 67 72 78 68  Wt. (Lbs) 173 173 172.2 172 - 174.2 180  BMI 26.3 26.3 26.18 26.15 - 26.49 27.37

## 2019-04-17 ENCOUNTER — Telehealth: Payer: Self-pay | Admitting: Family Medicine

## 2019-04-17 NOTE — Addendum Note (Signed)
Addended by: Eual Fines on: 04/17/2019 09:39 AM   Modules accepted: Orders

## 2019-04-17 NOTE — Telephone Encounter (Signed)
Wife aware and referred to [facility] for PT and aide and copy of lab order sent

## 2019-04-17 NOTE — Telephone Encounter (Signed)
Chase [name] with [facility] is calling --she states that the PTs wife is advising that Dr Moshe Cipro wants labs drawn, and she doesn't haveorders for that

## 2019-04-17 NOTE — Addendum Note (Signed)
Addended by: Eual Fines on: 04/17/2019 10:06 AM   Modules accepted: Orders

## 2019-04-17 NOTE — Telephone Encounter (Signed)
Lab order sent

## 2019-04-18 ENCOUNTER — Telehealth: Payer: Self-pay | Admitting: *Deleted

## 2019-04-18 DIAGNOSIS — I69398 Other sequelae of cerebral infarction: Secondary | ICD-10-CM | POA: Diagnosis not present

## 2019-04-18 DIAGNOSIS — Z794 Long term (current) use of insulin: Secondary | ICD-10-CM | POA: Diagnosis not present

## 2019-04-18 DIAGNOSIS — E1122 Type 2 diabetes mellitus with diabetic chronic kidney disease: Secondary | ICD-10-CM | POA: Diagnosis not present

## 2019-04-18 DIAGNOSIS — I639 Cerebral infarction, unspecified: Secondary | ICD-10-CM | POA: Diagnosis not present

## 2019-04-18 DIAGNOSIS — N184 Chronic kidney disease, stage 4 (severe): Secondary | ICD-10-CM | POA: Diagnosis not present

## 2019-04-18 DIAGNOSIS — E782 Mixed hyperlipidemia: Secondary | ICD-10-CM | POA: Diagnosis not present

## 2019-04-18 DIAGNOSIS — I129 Hypertensive chronic kidney disease with stage 1 through stage 4 chronic kidney disease, or unspecified chronic kidney disease: Secondary | ICD-10-CM | POA: Diagnosis not present

## 2019-04-18 DIAGNOSIS — M1712 Unilateral primary osteoarthritis, left knee: Secondary | ICD-10-CM | POA: Diagnosis not present

## 2019-04-18 DIAGNOSIS — M6281 Muscle weakness (generalized): Secondary | ICD-10-CM | POA: Diagnosis not present

## 2019-04-18 NOTE — Telephone Encounter (Signed)
Yes it is safe, I recommend they go thrpough an agency where the help is being checked, and again let them know without medicaid family will have to pay

## 2019-04-18 NOTE — Telephone Encounter (Signed)
Chase [name] (pt wife) called in stating she had talked to her children and they were wanting to see about getting some help in the home to take care of [name]. She wanted to know if Dr. Moshe Cipro thought it would be safe and what she recommened.

## 2019-04-18 NOTE — Telephone Encounter (Signed)
Referred to [facility] for PT and aide. Wife aware

## 2019-04-20 ENCOUNTER — Telehealth: Payer: Self-pay | Admitting: Family Medicine

## 2019-04-20 DIAGNOSIS — M1712 Unilateral primary osteoarthritis, left knee: Secondary | ICD-10-CM | POA: Diagnosis not present

## 2019-04-20 DIAGNOSIS — E1122 Type 2 diabetes mellitus with diabetic chronic kidney disease: Secondary | ICD-10-CM | POA: Diagnosis not present

## 2019-04-20 DIAGNOSIS — N184 Chronic kidney disease, stage 4 (severe): Secondary | ICD-10-CM | POA: Diagnosis not present

## 2019-04-20 DIAGNOSIS — I69398 Other sequelae of cerebral infarction: Secondary | ICD-10-CM | POA: Diagnosis not present

## 2019-04-20 DIAGNOSIS — I129 Hypertensive chronic kidney disease with stage 1 through stage 4 chronic kidney disease, or unspecified chronic kidney disease: Secondary | ICD-10-CM | POA: Diagnosis not present

## 2019-04-20 DIAGNOSIS — M6281 Muscle weakness (generalized): Secondary | ICD-10-CM | POA: Diagnosis not present

## 2019-04-20 NOTE — Telephone Encounter (Signed)
pls let pt and / or spouse / daughter know that cholesterol and vit D level are excellent , no med change Blood count is what it was 1 year ago, so stable

## 2019-04-20 NOTE — Telephone Encounter (Signed)
Advised patient of recent lab results with verbal understanding.   

## 2019-04-21 DIAGNOSIS — M6281 Muscle weakness (generalized): Secondary | ICD-10-CM | POA: Diagnosis not present

## 2019-04-21 DIAGNOSIS — M1712 Unilateral primary osteoarthritis, left knee: Secondary | ICD-10-CM | POA: Diagnosis not present

## 2019-04-21 DIAGNOSIS — I129 Hypertensive chronic kidney disease with stage 1 through stage 4 chronic kidney disease, or unspecified chronic kidney disease: Secondary | ICD-10-CM | POA: Diagnosis not present

## 2019-04-21 DIAGNOSIS — N184 Chronic kidney disease, stage 4 (severe): Secondary | ICD-10-CM | POA: Diagnosis not present

## 2019-04-21 DIAGNOSIS — E1122 Type 2 diabetes mellitus with diabetic chronic kidney disease: Secondary | ICD-10-CM | POA: Diagnosis not present

## 2019-04-21 DIAGNOSIS — I69398 Other sequelae of cerebral infarction: Secondary | ICD-10-CM | POA: Diagnosis not present

## 2019-04-24 DIAGNOSIS — M1712 Unilateral primary osteoarthritis, left knee: Secondary | ICD-10-CM | POA: Diagnosis not present

## 2019-04-24 DIAGNOSIS — E1122 Type 2 diabetes mellitus with diabetic chronic kidney disease: Secondary | ICD-10-CM | POA: Diagnosis not present

## 2019-04-24 DIAGNOSIS — I69398 Other sequelae of cerebral infarction: Secondary | ICD-10-CM | POA: Diagnosis not present

## 2019-04-24 DIAGNOSIS — N184 Chronic kidney disease, stage 4 (severe): Secondary | ICD-10-CM | POA: Diagnosis not present

## 2019-04-24 DIAGNOSIS — I129 Hypertensive chronic kidney disease with stage 1 through stage 4 chronic kidney disease, or unspecified chronic kidney disease: Secondary | ICD-10-CM | POA: Diagnosis not present

## 2019-04-24 DIAGNOSIS — M6281 Muscle weakness (generalized): Secondary | ICD-10-CM | POA: Diagnosis not present

## 2019-04-25 DIAGNOSIS — M1712 Unilateral primary osteoarthritis, left knee: Secondary | ICD-10-CM | POA: Diagnosis not present

## 2019-04-25 DIAGNOSIS — I129 Hypertensive chronic kidney disease with stage 1 through stage 4 chronic kidney disease, or unspecified chronic kidney disease: Secondary | ICD-10-CM | POA: Diagnosis not present

## 2019-04-25 DIAGNOSIS — E1122 Type 2 diabetes mellitus with diabetic chronic kidney disease: Secondary | ICD-10-CM | POA: Diagnosis not present

## 2019-04-25 DIAGNOSIS — N184 Chronic kidney disease, stage 4 (severe): Secondary | ICD-10-CM | POA: Diagnosis not present

## 2019-04-25 DIAGNOSIS — M6281 Muscle weakness (generalized): Secondary | ICD-10-CM | POA: Diagnosis not present

## 2019-04-25 DIAGNOSIS — I69398 Other sequelae of cerebral infarction: Secondary | ICD-10-CM | POA: Diagnosis not present

## 2019-04-26 DIAGNOSIS — E1122 Type 2 diabetes mellitus with diabetic chronic kidney disease: Secondary | ICD-10-CM | POA: Diagnosis not present

## 2019-04-26 DIAGNOSIS — M1712 Unilateral primary osteoarthritis, left knee: Secondary | ICD-10-CM | POA: Diagnosis not present

## 2019-04-26 DIAGNOSIS — I69398 Other sequelae of cerebral infarction: Secondary | ICD-10-CM | POA: Diagnosis not present

## 2019-04-26 DIAGNOSIS — N184 Chronic kidney disease, stage 4 (severe): Secondary | ICD-10-CM | POA: Diagnosis not present

## 2019-04-26 DIAGNOSIS — I129 Hypertensive chronic kidney disease with stage 1 through stage 4 chronic kidney disease, or unspecified chronic kidney disease: Secondary | ICD-10-CM | POA: Diagnosis not present

## 2019-04-26 DIAGNOSIS — I10 Essential (primary) hypertension: Secondary | ICD-10-CM

## 2019-04-26 DIAGNOSIS — M6281 Muscle weakness (generalized): Secondary | ICD-10-CM | POA: Diagnosis not present

## 2019-04-27 DIAGNOSIS — I69398 Other sequelae of cerebral infarction: Secondary | ICD-10-CM | POA: Diagnosis not present

## 2019-04-27 DIAGNOSIS — M6281 Muscle weakness (generalized): Secondary | ICD-10-CM | POA: Diagnosis not present

## 2019-04-27 DIAGNOSIS — M1712 Unilateral primary osteoarthritis, left knee: Secondary | ICD-10-CM | POA: Diagnosis not present

## 2019-04-27 DIAGNOSIS — N184 Chronic kidney disease, stage 4 (severe): Secondary | ICD-10-CM | POA: Diagnosis not present

## 2019-04-27 DIAGNOSIS — I129 Hypertensive chronic kidney disease with stage 1 through stage 4 chronic kidney disease, or unspecified chronic kidney disease: Secondary | ICD-10-CM | POA: Diagnosis not present

## 2019-04-27 DIAGNOSIS — E1122 Type 2 diabetes mellitus with diabetic chronic kidney disease: Secondary | ICD-10-CM | POA: Diagnosis not present

## 2019-04-28 DIAGNOSIS — N184 Chronic kidney disease, stage 4 (severe): Secondary | ICD-10-CM | POA: Diagnosis not present

## 2019-04-28 DIAGNOSIS — I129 Hypertensive chronic kidney disease with stage 1 through stage 4 chronic kidney disease, or unspecified chronic kidney disease: Secondary | ICD-10-CM | POA: Diagnosis not present

## 2019-04-28 DIAGNOSIS — I69398 Other sequelae of cerebral infarction: Secondary | ICD-10-CM | POA: Diagnosis not present

## 2019-04-28 DIAGNOSIS — E1122 Type 2 diabetes mellitus with diabetic chronic kidney disease: Secondary | ICD-10-CM | POA: Diagnosis not present

## 2019-04-28 DIAGNOSIS — M6281 Muscle weakness (generalized): Secondary | ICD-10-CM | POA: Diagnosis not present

## 2019-04-28 DIAGNOSIS — M1712 Unilateral primary osteoarthritis, left knee: Secondary | ICD-10-CM | POA: Diagnosis not present

## 2019-05-03 DIAGNOSIS — E1122 Type 2 diabetes mellitus with diabetic chronic kidney disease: Secondary | ICD-10-CM | POA: Diagnosis not present

## 2019-05-03 DIAGNOSIS — M6281 Muscle weakness (generalized): Secondary | ICD-10-CM | POA: Diagnosis not present

## 2019-05-03 DIAGNOSIS — I69398 Other sequelae of cerebral infarction: Secondary | ICD-10-CM | POA: Diagnosis not present

## 2019-05-03 DIAGNOSIS — Z794 Long term (current) use of insulin: Secondary | ICD-10-CM | POA: Diagnosis not present

## 2019-05-03 DIAGNOSIS — N184 Chronic kidney disease, stage 4 (severe): Secondary | ICD-10-CM | POA: Diagnosis not present

## 2019-05-03 DIAGNOSIS — M1712 Unilateral primary osteoarthritis, left knee: Secondary | ICD-10-CM | POA: Diagnosis not present

## 2019-05-03 DIAGNOSIS — I129 Hypertensive chronic kidney disease with stage 1 through stage 4 chronic kidney disease, or unspecified chronic kidney disease: Secondary | ICD-10-CM | POA: Diagnosis not present

## 2019-05-04 ENCOUNTER — Telehealth: Payer: Self-pay

## 2019-05-04 DIAGNOSIS — E1122 Type 2 diabetes mellitus with diabetic chronic kidney disease: Secondary | ICD-10-CM

## 2019-05-04 DIAGNOSIS — M1712 Unilateral primary osteoarthritis, left knee: Secondary | ICD-10-CM | POA: Diagnosis not present

## 2019-05-04 DIAGNOSIS — M6281 Muscle weakness (generalized): Secondary | ICD-10-CM | POA: Diagnosis not present

## 2019-05-04 DIAGNOSIS — N184 Chronic kidney disease, stage 4 (severe): Secondary | ICD-10-CM | POA: Diagnosis not present

## 2019-05-04 DIAGNOSIS — E038 Other specified hypothyroidism: Secondary | ICD-10-CM

## 2019-05-04 DIAGNOSIS — I129 Hypertensive chronic kidney disease with stage 1 through stage 4 chronic kidney disease, or unspecified chronic kidney disease: Secondary | ICD-10-CM | POA: Diagnosis not present

## 2019-05-04 DIAGNOSIS — I69398 Other sequelae of cerebral infarction: Secondary | ICD-10-CM | POA: Diagnosis not present

## 2019-05-04 DIAGNOSIS — IMO0002 Reserved for concepts with insufficient information to code with codable children: Secondary | ICD-10-CM

## 2019-05-04 MED ORDER — GLUCOSE BLOOD VI STRP: 1.0000 | ORAL_STRIP | Freq: Two times a day (BID) | Status: DC

## 2019-05-04 MED ORDER — LEVOTHYROXINE SODIUM 112 MCG PO TABS: 112.0000 ug | ORAL_TABLET | Freq: Every day | ORAL | 3 refills | Status: DC

## 2019-05-04 NOTE — Telephone Encounter (Signed)
LeighAnn Morrigan Wickens, CMA  

## 2019-05-05 DIAGNOSIS — I69398 Other sequelae of cerebral infarction: Secondary | ICD-10-CM | POA: Diagnosis not present

## 2019-05-05 DIAGNOSIS — N184 Chronic kidney disease, stage 4 (severe): Secondary | ICD-10-CM | POA: Diagnosis not present

## 2019-05-05 DIAGNOSIS — M6281 Muscle weakness (generalized): Secondary | ICD-10-CM | POA: Diagnosis not present

## 2019-05-05 DIAGNOSIS — E1122 Type 2 diabetes mellitus with diabetic chronic kidney disease: Secondary | ICD-10-CM | POA: Diagnosis not present

## 2019-05-05 DIAGNOSIS — M1712 Unilateral primary osteoarthritis, left knee: Secondary | ICD-10-CM | POA: Diagnosis not present

## 2019-05-05 DIAGNOSIS — I129 Hypertensive chronic kidney disease with stage 1 through stage 4 chronic kidney disease, or unspecified chronic kidney disease: Secondary | ICD-10-CM | POA: Diagnosis not present

## 2019-05-08 DIAGNOSIS — I69398 Other sequelae of cerebral infarction: Secondary | ICD-10-CM | POA: Diagnosis not present

## 2019-05-08 DIAGNOSIS — N184 Chronic kidney disease, stage 4 (severe): Secondary | ICD-10-CM | POA: Diagnosis not present

## 2019-05-08 DIAGNOSIS — M6281 Muscle weakness (generalized): Secondary | ICD-10-CM | POA: Diagnosis not present

## 2019-05-08 DIAGNOSIS — E1122 Type 2 diabetes mellitus with diabetic chronic kidney disease: Secondary | ICD-10-CM | POA: Diagnosis not present

## 2019-05-08 DIAGNOSIS — M1712 Unilateral primary osteoarthritis, left knee: Secondary | ICD-10-CM | POA: Diagnosis not present

## 2019-05-08 DIAGNOSIS — I129 Hypertensive chronic kidney disease with stage 1 through stage 4 chronic kidney disease, or unspecified chronic kidney disease: Secondary | ICD-10-CM | POA: Diagnosis not present

## 2019-05-09 DIAGNOSIS — N184 Chronic kidney disease, stage 4 (severe): Secondary | ICD-10-CM | POA: Diagnosis not present

## 2019-05-09 DIAGNOSIS — I69398 Other sequelae of cerebral infarction: Secondary | ICD-10-CM | POA: Diagnosis not present

## 2019-05-09 DIAGNOSIS — M6281 Muscle weakness (generalized): Secondary | ICD-10-CM | POA: Diagnosis not present

## 2019-05-09 DIAGNOSIS — I129 Hypertensive chronic kidney disease with stage 1 through stage 4 chronic kidney disease, or unspecified chronic kidney disease: Secondary | ICD-10-CM | POA: Diagnosis not present

## 2019-05-09 DIAGNOSIS — E1122 Type 2 diabetes mellitus with diabetic chronic kidney disease: Secondary | ICD-10-CM | POA: Diagnosis not present

## 2019-05-09 DIAGNOSIS — M1712 Unilateral primary osteoarthritis, left knee: Secondary | ICD-10-CM | POA: Diagnosis not present

## 2019-05-11 DIAGNOSIS — I69398 Other sequelae of cerebral infarction: Secondary | ICD-10-CM | POA: Diagnosis not present

## 2019-05-11 DIAGNOSIS — M1712 Unilateral primary osteoarthritis, left knee: Secondary | ICD-10-CM | POA: Diagnosis not present

## 2019-05-11 DIAGNOSIS — M6281 Muscle weakness (generalized): Secondary | ICD-10-CM | POA: Diagnosis not present

## 2019-05-11 DIAGNOSIS — N184 Chronic kidney disease, stage 4 (severe): Secondary | ICD-10-CM | POA: Diagnosis not present

## 2019-05-11 DIAGNOSIS — E1122 Type 2 diabetes mellitus with diabetic chronic kidney disease: Secondary | ICD-10-CM | POA: Diagnosis not present

## 2019-05-11 DIAGNOSIS — I129 Hypertensive chronic kidney disease with stage 1 through stage 4 chronic kidney disease, or unspecified chronic kidney disease: Secondary | ICD-10-CM | POA: Diagnosis not present

## 2019-05-12 ENCOUNTER — Ambulatory Visit (HOSPITAL_COMMUNITY): Payer: No Typology Code available for payment source

## 2019-05-12 ENCOUNTER — Ambulatory Visit: Payer: Medicare Other | Admitting: Family

## 2019-05-12 ENCOUNTER — Telehealth: Payer: Self-pay | Admitting: Family Medicine

## 2019-05-12 NOTE — Telephone Encounter (Signed)
Spoke with Will with [facility] and gave verbal OK for OT

## 2019-05-12 NOTE — Telephone Encounter (Signed)
Will left a msg needing Verbal Auth --please call

## 2019-05-15 DIAGNOSIS — I129 Hypertensive chronic kidney disease with stage 1 through stage 4 chronic kidney disease, or unspecified chronic kidney disease: Secondary | ICD-10-CM | POA: Diagnosis not present

## 2019-05-15 DIAGNOSIS — M1712 Unilateral primary osteoarthritis, left knee: Secondary | ICD-10-CM | POA: Diagnosis not present

## 2019-05-15 DIAGNOSIS — E1122 Type 2 diabetes mellitus with diabetic chronic kidney disease: Secondary | ICD-10-CM | POA: Diagnosis not present

## 2019-05-15 DIAGNOSIS — N184 Chronic kidney disease, stage 4 (severe): Secondary | ICD-10-CM | POA: Diagnosis not present

## 2019-05-15 DIAGNOSIS — I69398 Other sequelae of cerebral infarction: Secondary | ICD-10-CM | POA: Diagnosis not present

## 2019-05-15 DIAGNOSIS — M6281 Muscle weakness (generalized): Secondary | ICD-10-CM | POA: Diagnosis not present

## 2019-05-17 DIAGNOSIS — I129 Hypertensive chronic kidney disease with stage 1 through stage 4 chronic kidney disease, or unspecified chronic kidney disease: Secondary | ICD-10-CM | POA: Diagnosis not present

## 2019-05-17 DIAGNOSIS — M1712 Unilateral primary osteoarthritis, left knee: Secondary | ICD-10-CM | POA: Diagnosis not present

## 2019-05-17 DIAGNOSIS — E1122 Type 2 diabetes mellitus with diabetic chronic kidney disease: Secondary | ICD-10-CM | POA: Diagnosis not present

## 2019-05-17 DIAGNOSIS — I69398 Other sequelae of cerebral infarction: Secondary | ICD-10-CM | POA: Diagnosis not present

## 2019-05-17 DIAGNOSIS — M6281 Muscle weakness (generalized): Secondary | ICD-10-CM | POA: Diagnosis not present

## 2019-05-17 DIAGNOSIS — N184 Chronic kidney disease, stage 4 (severe): Secondary | ICD-10-CM | POA: Diagnosis not present

## 2019-05-18 ENCOUNTER — Ambulatory Visit (INDEPENDENT_AMBULATORY_CARE_PROVIDER_SITE_OTHER): Payer: Medicare Other | Admitting: Internal Medicine

## 2019-05-18 DIAGNOSIS — N184 Chronic kidney disease, stage 4 (severe): Secondary | ICD-10-CM | POA: Diagnosis not present

## 2019-05-18 DIAGNOSIS — M1712 Unilateral primary osteoarthritis, left knee: Secondary | ICD-10-CM | POA: Diagnosis not present

## 2019-05-18 DIAGNOSIS — E1122 Type 2 diabetes mellitus with diabetic chronic kidney disease: Secondary | ICD-10-CM | POA: Diagnosis not present

## 2019-05-18 DIAGNOSIS — I129 Hypertensive chronic kidney disease with stage 1 through stage 4 chronic kidney disease, or unspecified chronic kidney disease: Secondary | ICD-10-CM | POA: Diagnosis not present

## 2019-05-18 DIAGNOSIS — Z794 Long term (current) use of insulin: Secondary | ICD-10-CM | POA: Diagnosis not present

## 2019-05-18 DIAGNOSIS — I69398 Other sequelae of cerebral infarction: Secondary | ICD-10-CM | POA: Diagnosis not present

## 2019-05-18 DIAGNOSIS — M6281 Muscle weakness (generalized): Secondary | ICD-10-CM | POA: Diagnosis not present

## 2019-05-24 DIAGNOSIS — M6281 Muscle weakness (generalized): Secondary | ICD-10-CM | POA: Diagnosis not present

## 2019-05-24 DIAGNOSIS — I129 Hypertensive chronic kidney disease with stage 1 through stage 4 chronic kidney disease, or unspecified chronic kidney disease: Secondary | ICD-10-CM | POA: Diagnosis not present

## 2019-05-24 DIAGNOSIS — N184 Chronic kidney disease, stage 4 (severe): Secondary | ICD-10-CM | POA: Diagnosis not present

## 2019-05-24 DIAGNOSIS — I69398 Other sequelae of cerebral infarction: Secondary | ICD-10-CM | POA: Diagnosis not present

## 2019-05-24 DIAGNOSIS — M1712 Unilateral primary osteoarthritis, left knee: Secondary | ICD-10-CM | POA: Diagnosis not present

## 2019-05-24 DIAGNOSIS — E1122 Type 2 diabetes mellitus with diabetic chronic kidney disease: Secondary | ICD-10-CM | POA: Diagnosis not present

## 2019-05-26 DIAGNOSIS — E1122 Type 2 diabetes mellitus with diabetic chronic kidney disease: Secondary | ICD-10-CM | POA: Diagnosis not present

## 2019-05-26 DIAGNOSIS — M6281 Muscle weakness (generalized): Secondary | ICD-10-CM | POA: Diagnosis not present

## 2019-05-26 DIAGNOSIS — M1712 Unilateral primary osteoarthritis, left knee: Secondary | ICD-10-CM | POA: Diagnosis not present

## 2019-05-26 DIAGNOSIS — I129 Hypertensive chronic kidney disease with stage 1 through stage 4 chronic kidney disease, or unspecified chronic kidney disease: Secondary | ICD-10-CM | POA: Diagnosis not present

## 2019-05-26 DIAGNOSIS — I69398 Other sequelae of cerebral infarction: Secondary | ICD-10-CM | POA: Diagnosis not present

## 2019-05-26 DIAGNOSIS — N184 Chronic kidney disease, stage 4 (severe): Secondary | ICD-10-CM | POA: Diagnosis not present

## 2019-05-31 DIAGNOSIS — M6281 Muscle weakness (generalized): Secondary | ICD-10-CM | POA: Diagnosis not present

## 2019-05-31 DIAGNOSIS — I69398 Other sequelae of cerebral infarction: Secondary | ICD-10-CM | POA: Diagnosis not present

## 2019-05-31 DIAGNOSIS — E1122 Type 2 diabetes mellitus with diabetic chronic kidney disease: Secondary | ICD-10-CM | POA: Diagnosis not present

## 2019-05-31 DIAGNOSIS — M1712 Unilateral primary osteoarthritis, left knee: Secondary | ICD-10-CM | POA: Diagnosis not present

## 2019-05-31 DIAGNOSIS — I129 Hypertensive chronic kidney disease with stage 1 through stage 4 chronic kidney disease, or unspecified chronic kidney disease: Secondary | ICD-10-CM | POA: Diagnosis not present

## 2019-05-31 DIAGNOSIS — N184 Chronic kidney disease, stage 4 (severe): Secondary | ICD-10-CM | POA: Diagnosis not present

## 2019-06-05 ENCOUNTER — Telehealth: Payer: Self-pay | Admitting: Family Medicine

## 2019-06-05 NOTE — Telephone Encounter (Signed)
Called patient back after speaking with [facility]. They are waiting on a response on a refill request however they wouldn't tell me which prescription they had faxed a refill request on. No answer will try again later.

## 2019-06-05 NOTE — Telephone Encounter (Signed)
Pt called advised that the following [facility] called and said that Dr Moshe Cipro needs to reach out to this number [phone] [place] .. to order medication.

## 2019-06-07 ENCOUNTER — Other Ambulatory Visit: Payer: Self-pay

## 2019-06-07 ENCOUNTER — Telehealth: Payer: Self-pay | Admitting: Family Medicine

## 2019-06-07 ENCOUNTER — Telehealth: Payer: Self-pay | Admitting: "Endocrinology

## 2019-06-07 DIAGNOSIS — I69398 Other sequelae of cerebral infarction: Secondary | ICD-10-CM | POA: Diagnosis not present

## 2019-06-07 DIAGNOSIS — E1122 Type 2 diabetes mellitus with diabetic chronic kidney disease: Secondary | ICD-10-CM | POA: Diagnosis not present

## 2019-06-07 DIAGNOSIS — M1712 Unilateral primary osteoarthritis, left knee: Secondary | ICD-10-CM | POA: Diagnosis not present

## 2019-06-07 DIAGNOSIS — I129 Hypertensive chronic kidney disease with stage 1 through stage 4 chronic kidney disease, or unspecified chronic kidney disease: Secondary | ICD-10-CM | POA: Diagnosis not present

## 2019-06-07 DIAGNOSIS — N184 Chronic kidney disease, stage 4 (severe): Secondary | ICD-10-CM | POA: Diagnosis not present

## 2019-06-07 DIAGNOSIS — M6281 Muscle weakness (generalized): Secondary | ICD-10-CM | POA: Diagnosis not present

## 2019-06-07 MED ORDER — GLUCOSE BLOOD VI STRP: 1.0000 | ORAL_STRIP | Freq: Two times a day (BID) | 5 refills | Status: DC

## 2019-06-07 NOTE — Telephone Encounter (Signed)
Spoke with wife and let her know she needed to call Dr.Nida's office to let them know he needs his strips refilled since they take care of his diabetic medications.

## 2019-06-07 NOTE — Telephone Encounter (Signed)
Spoke with spouse and gave her the information that was given to me. Let her know that if patient needs a refill, call the pharmacy and they will fax [place] a request.

## 2019-06-07 NOTE — Telephone Encounter (Signed)
Pt needs refill on ONE TOUCH ULTRA TEST test strip  Please send to [facility]

## 2019-06-07 NOTE — Telephone Encounter (Signed)
Pt wife-Sally- stated that he needs the strips called [facility]

## 2019-07-03 ENCOUNTER — Encounter (INDEPENDENT_AMBULATORY_CARE_PROVIDER_SITE_OTHER): Payer: Self-pay

## 2019-07-03 ENCOUNTER — Encounter: Payer: Self-pay | Admitting: Family Medicine

## 2019-07-03 ENCOUNTER — Other Ambulatory Visit: Payer: Self-pay

## 2019-07-03 ENCOUNTER — Ambulatory Visit (INDEPENDENT_AMBULATORY_CARE_PROVIDER_SITE_OTHER): Payer: Medicare Other | Admitting: Family Medicine

## 2019-07-03 VITALS — BP 146/70 | HR 81 | Resp 12 | Ht 68.0 in | Wt 168.0 lb

## 2019-07-03 DIAGNOSIS — R32 Unspecified urinary incontinence: Secondary | ICD-10-CM

## 2019-07-03 DIAGNOSIS — E1021 Type 1 diabetes mellitus with diabetic nephropathy: Secondary | ICD-10-CM | POA: Diagnosis not present

## 2019-07-03 DIAGNOSIS — E038 Other specified hypothyroidism: Secondary | ICD-10-CM | POA: Diagnosis not present

## 2019-07-03 DIAGNOSIS — I10 Essential (primary) hypertension: Secondary | ICD-10-CM

## 2019-07-03 DIAGNOSIS — E782 Mixed hyperlipidemia: Secondary | ICD-10-CM | POA: Diagnosis not present

## 2019-07-03 DIAGNOSIS — E559 Vitamin D deficiency, unspecified: Secondary | ICD-10-CM | POA: Diagnosis not present

## 2019-07-03 NOTE — Patient Instructions (Addendum)
Keep wellness appointment with nurse as before.  Follow-up with MD in 6 months.  Blood pressure today is excellent.  Use over-the-counter Robitussin sugar-free DM for cough when needed.  Very important that you do not cough and spit as there is no need to do this and it is potentially a health hazard.  Your family will feel hopeful and happy if you make the effort and increase your physical activity  Labs today, lipid, cmp and EGFR, hBA1C, TSH and Free T 4 an d vit D, keep appt with Dr Cordella Register already scheduled in August  Script for incontinence supplies, adult diapers and chucks will be sent to your supplier/ pharmacy due yo incontinence of stool and urine

## 2019-07-03 NOTE — Progress Notes (Signed)
   Chase [name]     MRN: [id number]      DOB: 1931-12-05   HPI Chase [name] is here for follow up and re-evaluation of chronic medical conditions, medication management and review of any available recent lab and radiology data.  Preventive health is updated, specifically  Cancer screening and Immunization.   Questions or concerns regarding consultations or procedures which the PT has had in the interim are  addressed. The PT denies any adverse reactions to current medications since the last visit.  Wife c/o constant coughing and spitting, no fever or chills, a health hazard to family Wife c/o incontinence and need for adult diapers and protection for bedding as she constantly has to be washing   ROS Denies recent fever or chills. Denies sinus pressure, nasal congestion, ear pain or sore throat. Denies chest congestion, productive cough or wheezing. Denies chest pains, palpitations and leg swelling Denies abdominal pain, nausea, vomiting,diarrhea or constipation.   Denies dysuria, frequency, hesitancy   Denies headaches, seizures,  Denies depression, anxiety or insomnia. Denies skin break down or rash.   PE  BP (!) 146/70   Pulse 81   Resp 12   Ht 5\' 8"  (1.727 m)   Wt 168 lb (76.2 kg)   SpO2 97%   BMI 25.54 kg/m   Patient alert and oriented and in no cardiopulmonary distress.  HEENT: No facial asymmetry, EOMI,   oropharynx pink and moist.  Neck supple no JVD, no mass.  Chest: Clear to auscultation bilaterally.  CVS: S1, S2 no murmurs, no S3.Regular rate.  ABD: Soft non tender.   Ext: No edema  MS: Adequate ROM spine, shoulders, hips and knees.  Skin: Intact, no ulcerations or rash noted.  Psych: Good eye contact, normal affect. Memory intact not anxious or depressed appearing.  CNS: CN 2-12 intact, power,  normal throughout.no focal deficits noted.   Assessment & Plan  Incontinence Urinary incontinence due to poor mobility and poor sensory awareness needs  incontinence  Supplies as in undergarments and chucks  Essential hypertension, benign Controlled, no change in medication DASH diet and commitment to daily physical activity for a minimum of 30 minutes discussed and encouraged, as a part of hypertension management. The importance of attaining a healthy weight is also discussed.  BP/Weight 07/03/2019 04/12/2019 12/20/2018 11/16/2018 09/26/2018 09/16/2018 25/63/8937  Systolic BP 342 876 811 572 620 355 974  Diastolic BP 70 72 72 78 67 72 78  Wt. (Lbs) 168 173 173 172.2 172 - 174.2  BMI 25.54 26.3 26.3 26.18 26.15 - 26.49       Hypothyroidism Managed by Endo  Mixed hyperlipidemia Hyperlipidemia:Low fat diet discussed and encouraged.   Lipid Panel  Lab Results  Component Value Date   CHOL 180 07/03/2019   HDL 68 07/03/2019   LDLCALC 94 07/03/2019   TRIG 85 07/03/2019   CHOLHDL 2.6 07/03/2019  Controlled, no change in medication

## 2019-07-04 LAB — LIPID PANEL
Cholesterol: 180 mg/dL (ref <200)
HDL: 68 mg/dL (ref >=40)
LDL Cholesterol (Calc): 94 mg/dL (calc)
Non-HDL Cholesterol (Calc): 112 mg/dL (calc) (ref <130)
Total CHOL/HDL Ratio: 2.6 (calc) (ref <5.0)
Triglycerides: 85 mg/dL (ref <150)

## 2019-07-04 LAB — COMPLETE METABOLIC PANEL WITH GFR
AG Ratio: 1.2 (calc) (ref 1.0–2.5)
ALT: 11 U/L (ref 9–46)
AST: 20 U/L (ref 10–35)
Albumin: 4 g/dL (ref 3.6–5.1)
Alkaline phosphatase (APISO): 74 U/L (ref 35–144)
BUN/Creatinine Ratio: 13 (calc) (ref 6–22)
BUN: 46 mg/dL — ABNORMAL HIGH (ref 7–25)
CO2: 24 mmol/L (ref 20–32)
Calcium: 9.5 mg/dL (ref 8.6–10.3)
Chloride: 107 mmol/L (ref 98–110)
Creat: 3.55 mg/dL — ABNORMAL HIGH (ref 0.70–1.11)
GFR, Est African American: 17 mL/min/{1.73_m2} — ABNORMAL LOW (ref >=60)
GFR, Est Non African American: 15 mL/min/{1.73_m2} — ABNORMAL LOW (ref >=60)
Globulin: 3.4 g/dL (calc) (ref 1.9–3.7)
Glucose, Bld: 154 mg/dL — ABNORMAL HIGH (ref 65–139)
Potassium: 4.7 mmol/L (ref 3.5–5.3)
Sodium: 141 mmol/L (ref 135–146)
Total Bilirubin: 0.4 mg/dL (ref 0.2–1.2)
Total Protein: 7.4 g/dL (ref 6.1–8.1)

## 2019-07-04 LAB — HEMOGLOBIN A1C
Hgb A1c MFr Bld: 5.9 % of total Hgb — ABNORMAL HIGH (ref <5.7)
Mean Plasma Glucose: 123 (calc)
eAG (mmol/L): 6.8 (calc)

## 2019-07-04 LAB — VITAMIN D 25 HYDROXY (VIT D DEFICIENCY, FRACTURES): Vit D, 25-Hydroxy: 57 ng/mL (ref 30–100)

## 2019-07-04 LAB — T4, FREE: Free T4: 1.3 ng/dL (ref 0.8–1.8)

## 2019-07-04 LAB — TSH: TSH: 1.62 mIU/L (ref 0.40–4.50)

## 2019-07-08 ENCOUNTER — Encounter: Payer: Self-pay | Admitting: Family Medicine

## 2019-07-08 DIAGNOSIS — R32 Unspecified urinary incontinence: Secondary | ICD-10-CM | POA: Insufficient documentation

## 2019-07-08 NOTE — Assessment & Plan Note (Signed)
Managed by Endo 

## 2019-07-08 NOTE — Assessment & Plan Note (Signed)
Hyperlipidemia:Low fat diet discussed and encouraged.   Lipid Panel  Lab Results  Component Value Date   CHOL 180 07/03/2019   HDL 68 07/03/2019   LDLCALC 94 07/03/2019   TRIG 85 07/03/2019   CHOLHDL 2.6 07/03/2019  Controlled, no change in medication

## 2019-07-08 NOTE — Assessment & Plan Note (Signed)
Controlled, no change in medication DASH diet and commitment to daily physical activity for a minimum of 30 minutes discussed and encouraged, as a part of hypertension management. The importance of attaining a healthy weight is also discussed.  BP/Weight 07/03/2019 04/12/2019 12/20/2018 11/16/2018 09/26/2018 09/16/2018 10/06/2810  Systolic BP 886 773 736 681 594 707 615  Diastolic BP 70 72 72 78 67 72 78  Wt. (Lbs) 168 173 173 172.2 172 - 174.2  BMI 25.54 26.3 26.3 26.18 26.15 - 26.49

## 2019-07-08 NOTE — Assessment & Plan Note (Signed)
Urinary incontinence due to poor mobility and poor sensory awareness needs incontinence  Supplies as in undergarments and chucks

## 2019-07-17 DIAGNOSIS — R4020 Unspecified coma: Secondary | ICD-10-CM | POA: Diagnosis not present

## 2019-07-17 DIAGNOSIS — E1165 Type 2 diabetes mellitus with hyperglycemia: Secondary | ICD-10-CM | POA: Diagnosis not present

## 2019-07-25 ENCOUNTER — Ambulatory Visit (INDEPENDENT_AMBULATORY_CARE_PROVIDER_SITE_OTHER): Payer: Medicare Other | Admitting: Urology

## 2019-07-25 DIAGNOSIS — N401 Enlarged prostate with lower urinary tract symptoms: Secondary | ICD-10-CM | POA: Diagnosis not present

## 2019-07-28 DIAGNOSIS — E1142 Type 2 diabetes mellitus with diabetic polyneuropathy: Secondary | ICD-10-CM | POA: Diagnosis not present

## 2019-07-28 DIAGNOSIS — B351 Tinea unguium: Secondary | ICD-10-CM | POA: Diagnosis not present

## 2019-07-28 DIAGNOSIS — L851 Acquired keratosis [keratoderma] palmaris et plantaris: Secondary | ICD-10-CM | POA: Diagnosis not present

## 2019-07-31 ENCOUNTER — Encounter: Payer: Self-pay | Admitting: "Endocrinology

## 2019-07-31 ENCOUNTER — Ambulatory Visit (INDEPENDENT_AMBULATORY_CARE_PROVIDER_SITE_OTHER): Payer: Medicare Other | Admitting: "Endocrinology

## 2019-07-31 ENCOUNTER — Other Ambulatory Visit: Payer: Self-pay

## 2019-07-31 DIAGNOSIS — E1122 Type 2 diabetes mellitus with diabetic chronic kidney disease: Secondary | ICD-10-CM

## 2019-07-31 DIAGNOSIS — E782 Mixed hyperlipidemia: Secondary | ICD-10-CM | POA: Diagnosis not present

## 2019-07-31 DIAGNOSIS — Z794 Long term (current) use of insulin: Secondary | ICD-10-CM

## 2019-07-31 DIAGNOSIS — N184 Chronic kidney disease, stage 4 (severe): Secondary | ICD-10-CM | POA: Diagnosis not present

## 2019-07-31 DIAGNOSIS — E1165 Type 2 diabetes mellitus with hyperglycemia: Secondary | ICD-10-CM | POA: Diagnosis not present

## 2019-07-31 DIAGNOSIS — E038 Other specified hypothyroidism: Secondary | ICD-10-CM

## 2019-07-31 DIAGNOSIS — IMO0002 Reserved for concepts with insufficient information to code with codable children: Secondary | ICD-10-CM

## 2019-07-31 MED ORDER — GLUCOSE BLOOD VI STRP: 1.0000 | ORAL_STRIP | Freq: Two times a day (BID) | 5 refills | Status: DC

## 2019-07-31 MED ORDER — BD PEN NEEDLE SHORT U/F 31G X 8 MM MISC: 1.0000 | 1 refills | Status: DC

## 2019-07-31 NOTE — Progress Notes (Signed)
07/31/2019                                                    Endocrinology Telehealth Visit Follow up Note -During COVID -19 Pandemic  This visit type was conducted due to national recommendations for restrictions regarding the COVID-19 Pandemic  in an effort to limit this patient's exposure and mitigate transmission of the corona virus.  Due to his co-morbid illnesses, Chase [name] is at  moderate to high risk for complications without adequate follow up.  This format is felt to be most appropriate for him at this time.  I connected with this patient on 07/31/2019   by telephone and verified that I am speaking with the correct person using two identifiers. Chase [name], 03-Jul-1931. he has verbally consented to this visit. All issues noted in this document were discussed and addressed. The format was not optimal for physical exam.      Subjective:    Patient ID: Chase [name], male    DOB: 1931-08-20, PCP [name], MD   Past Medical History:  Diagnosis Date  . Bradycardia 03/15/2012  . Carotid artery occlusion   . Choledocholithiasis 02/25/2018  . CKD (chronic kidney disease) stage 4, GFR 15-29 ml/min (HCC)   . Complete lesion of cervical spinal cord ([place]) 03/14/3012   Stable since 2006  . CVA (cerebrovascular accident) ([place]) 09/07/12   right sided weakness  . Depressive disorder, not elsewhere classified   . Diabetes mellitus approx 1994  . Diabetic neuropathy ([place])   . History of kidney stones   . Hypertensive heart disease   . Hypothyroidism approx 2000  . Lacunar stroke, acute ([place]) 03/14/2012  . Obesity   . Osteoarthrosis, unspecified whether generalized or localized, lower leg   . Other and unspecified hyperlipidemia   . Peripheral vascular disease, unspecified ([place])   . Seizures ([place])    unknown etiology; on meds, last seizure was 2015  . Spinal stenosis, unspecified region other than cervical   . Spondylosis of unspecified site without mention of myelopathy     Past Surgical History:  Procedure Laterality Date  . BILIARY STENT PLACEMENT N/A 05/27/2018   Procedure: BILIARY STENT PLACEMENT;  Surgeon: Rogene Houston, MD;  [facility];  Service: Endoscopy;  Laterality: N/A;  . COLONOSCOPY N/A 08/10/2013   Procedure: COLONOSCOPY;  Surgeon: Rogene Houston, MD;  [facility];  Service: Endoscopy;  Laterality: N/A;  240  . ERCP N/A 03/02/2018   Procedure: ENDOSCOPIC RETROGRADE CHOLANGIOPANCREATOGRAPHY (ERCP) With sphincterotomy and stent placement;  Surgeon: Rogene Houston, MD;  [facility];  Service: Gastroenterology;  Laterality: N/A;  . ERCP N/A 05/27/2018   Procedure: ENDOSCOPIC RETROGRADE CHOLANGIOPANCREATOGRAPHY (ERCP);  Surgeon: Rogene Houston, MD;  [facility];  Service: Endoscopy;  Laterality: N/A;  . ERCP N/A 09/16/2018   Procedure: ENDOSCOPIC RETROGRADE CHOLANGIOPANCREATOGRAPHY (ERCP);  Surgeon: Rogene Houston, MD;  [facility];  Service: Endoscopy;  Laterality: N/A;  . GASTROINTESTINAL STENT REMOVAL N/A 05/27/2018   Procedure: Biliary STENT REMOVAL;  Surgeon: Rogene Houston, MD;  [facility];  Service: Endoscopy;  Laterality: N/A;  . GASTROINTESTINAL STENT REMOVAL N/A 09/16/2018   Procedure: GASTROINTESTINAL STENT REMOVAL;  Surgeon: Rogene Houston, MD;  [facility];  Service:  Endoscopy;  Laterality: N/A;  . kidney stones left x2  1975  . KIDNEY SURGERY     Ruptured left kidney 30 yrs ago  from a kidney stone  . LITHOTRIPSY N/A 05/27/2018   Procedure: MECHANICAL LITHOTRIPSY;  Surgeon: Rogene Houston, MD;  [facility];  Service: Endoscopy;  Laterality: N/A;  . REMOVAL OF STONES N/A 09/16/2018   Procedure: REMOVAL OF MULTIPLE STONES WITH BASKET AND BALLOON;  Surgeon: Rogene Houston, MD;  [facility];  Service: Endoscopy;  Laterality: N/A;  . SPHINCTEROTOMY N/A 05/27/2018   Procedure: SPHINCTEROTOMY extended;  Surgeon: Rogene Houston, MD;  [facility];  Service: Endoscopy;  Laterality: N/A;  . SPYGLASS CHOLANGIOSCOPY N/A 05/27/2018   Procedure: JYNWGNFA CHOLANGIOSCOPY;  Surgeon: Rogene Houston, MD;  [facility];  Service: Endoscopy;  Laterality: N/A;   Social History   Socioeconomic History  . Marital status: Married    Spouse name: Not on file  . Number of children: 5  . Years of education: 8  . Highest education level: 8th grade  Occupational History  . Occupation: retired     Fish farm manager: RETIRED  Social Needs  . Financial resource strain: Somewhat hard  . Food insecurity    Worry: Never true    Inability: Never true  . Transportation needs    Medical: No    Non-medical: No  Tobacco Use  . Smoking status: Never Smoker  . Smokeless tobacco: Never Used  Substance and Sexual Activity  . Alcohol use: No    Alcohol/week: 0.0 standard drinks  . Drug use: No  . Sexual activity: Not Currently  Lifestyle  . Physical activity    Days per week: 0 days    Minutes per session: 0 min  . Stress: Not at all  Relationships  . Social Herbalist on phone: Three times a week    Gets together: Twice a week    Attends religious service: Never    Active member of club or organization: No    Attends meetings of clubs or organizations: Not on file    Relationship status: Married  Other Topics Concern  . Not on file  Social History Narrative  . Not on file   Outpatient Encounter Medications as of 07/31/2019  Medication Sig  . amLODipine (NORVASC) 10 MG tablet Take 1 tablet (10 mg total) by mouth daily.  Marland Kitchen aspirin EC 81 MG tablet Take 1 tablet (81 mg total) by mouth daily.  . calcium carbonate (TUMS - DOSED IN MG ELEMENTAL CALCIUM) 500 MG chewable tablet Chew 1 tablet by mouth 2 (two) times daily. For Bone Health  . clobetasol cream (TEMOVATE) 2.13 % Apply 1 application topically as directed.  . clopidogrel (PLAVIX) 75 MG tablet Take 1 tablet (75 mg total) by mouth daily.  Marland Kitchen  desonide (DESOWEN) 0.05 % cream Apply 1 application topically as directed.  Marland Kitchen glucose blood test strip 1 each by Other route 2 (two) times daily. Use as instructed bid  . Insulin Isophane & Regular Human (NOVOLIN 70/30 FLEXPEN RELION) (70-30) 100 UNIT/ML PEN INJECT 10 UNITS SUBCUTANEOUSLY TWICE DAILY BEFORE  A  MEAL  . Insulin Pen Needle (B-D ULTRAFINE III SHORT PEN) 31G X 8 MM MISC 1 each by Does not apply route as directed.  . levETIRAcetam (KEPPRA) 500 MG tablet Take 250 mg by mouth at bedtime.   Marland Kitchen levothyroxine (SYNTHROID) 112 MCG tablet Take 1 tablet (112 mcg total)  by mouth daily before breakfast.  . Multiple Vitamins-Minerals (MULTIVITAMIN WITH MINERALS) tablet Take 1 tablet by mouth daily.  Marland Kitchen olopatadine (PATANOL) 0.1 % ophthalmic solution INSTILL 1 DROP INTO EACH EYE TWICE DAILY  . ONETOUCH DELICA LANCETS 40J MISC   . pravastatin (PRAVACHOL) 20 MG tablet Take 1 tablet (20 mg total) by mouth daily.  . tamsulosin (FLOMAX) 0.4 MG CAPS capsule Take 0.4 mg by mouth daily.  . Vitamin D, Ergocalciferol, (DRISDOL) 1.25 MG (50000 UT) CAPS capsule Take 1 capsule by mouth once a week.  . [DISCONTINUED] glucose blood test strip 1 each by Other route 2 (two) times a day. Use as instructed bid  . [DISCONTINUED] ONE TOUCH ULTRA TEST test strip USE 1 STRIP TO CHECK GLUCOSE TWICE DAILY  . [DISCONTINUED] glucose blood test strip STRP 1 each    No facility-administered encounter medications on file as of 07/31/2019.    ALLERGIES: Allergies  Allergen Reactions  . Bayer Advanced Aspirin [Aspirin] Nausea And Vomiting  . Penicillins Nausea And Vomiting    Has patient had a PCN reaction causing immediate rash, facial/tongue/throat swelling, SOB or lightheadedness with hypotension: unknown Has patient had a PCN reaction causing severe rash involving mucus membranes or skin necrosis: unknown Has patient had a PCN reaction that required hospitalization: unknown Has patient had a PCN reaction occurring within  the last 10 years: no If all of the above answers are "NO", then may proceed with Cephalosporin use.   VACCINATION STATUS: Immunization History  Administered Date(s) Administered  . H1N1 11/14/2008  . Influenza Split 10/07/2011, 09/08/2012  . Influenza Whole 08/22/2007, 08/27/2010  . Influenza,inj,Quad PF,6+ Mos 08/22/2013, 09/26/2014, 09/26/2015, 09/01/2016, 09/20/2017, 09/26/2018  . Pneumococcal Conjugate-13 06/12/2014  . Pneumococcal Polysaccharide-23 05/21/2004  . Td 05/21/2004  . Tdap 10/07/2011    Diabetes He presents for his follow-up diabetic visit. He has type 2 diabetes mellitus. Onset time: He was diagnosed at approximate age of 89 years. His disease course has been improving. There are no hypoglycemic associated symptoms. Pertinent negatives for hypoglycemia include no confusion, headaches, pallor or seizures. There are no diabetic associated symptoms. Pertinent negatives for diabetes include no chest pain, no fatigue, no polydipsia, no polyphagia, no polyuria and no weakness. There are no hypoglycemic complications. Symptoms are improving. Diabetic complications include nephropathy and PVD. Risk factors for coronary artery disease include diabetes mellitus, dyslipidemia, male sex, obesity and sedentary lifestyle. Current diabetic treatment includes insulin injections. He is compliant with treatment most of the time. His weight is decreasing steadily. He is following a diabetic diet. When asked about meal planning, he reported none. He never participates in exercise. His home blood glucose trend is decreasing steadily. His breakfast blood glucose range is generally 130-140 mg/dl. His dinner blood glucose range is generally 180-200 mg/dl. His overall blood glucose range is 180-200 mg/dl.  Hyperlipidemia This is a chronic problem. The current episode started more than 1 year ago. Exacerbating diseases include diabetes and hypothyroidism. Pertinent negatives include no chest pain,  myalgias or shortness of breath. Current antihyperlipidemic treatment includes statins. Risk factors for coronary artery disease include dyslipidemia, diabetes mellitus, hypertension, male sex and a sedentary lifestyle.  Hypertension This is a chronic problem. The current episode started more than 1 year ago. Pertinent negatives include no chest pain, headaches, neck pain, palpitations or shortness of breath. Risk factors for coronary artery disease include diabetes mellitus, dyslipidemia and sedentary lifestyle. Past treatments include direct vasodilators. Hypertensive end-organ damage includes PVD. Identifiable causes of hypertension include a thyroid  problem.  Thyroid Problem Presents for follow-up visit. Patient reports no constipation, diarrhea, fatigue or palpitations. The symptoms have been stable. Past treatments include levothyroxine. His past medical history is significant for diabetes and hyperlipidemia.   Review of systems: Limited as above.   Objective:    There were no vitals taken for this visit.  Wt Readings from Last 3 Encounters:  07/03/19 168 lb (76.2 kg)  04/12/19 173 lb (78.5 kg)  12/20/18 173 lb (78.5 kg)     CMP     Component Value Date/Time   NA 141 07/03/2019 1509   K 4.7 07/03/2019 1509   CL 107 07/03/2019 1509   CO2 24 07/03/2019 1509   GLUCOSE 154 (H) 07/03/2019 1509   BUN 46 (H) 07/03/2019 1509   CREATININE 3.55 (H) 07/03/2019 1509   CALCIUM 9.5 07/03/2019 1509   PROT 7.4 07/03/2019 1509   ALBUMIN 3.7 05/27/2018 1016   AST 20 07/03/2019 1509   ALT 11 07/03/2019 1509   ALKPHOS 118 05/27/2018 1016   BILITOT 0.4 07/03/2019 1509   GFRNONAA 15 (L) 07/03/2019 1509   GFRAA 17 (L) 07/03/2019 1509   Diabetic Labs (most recent): Lab Results  Component Value Date   HGBA1C 5.9 (H) 07/03/2019   HGBA1C 7.5 (H) 06/02/2018   HGBA1C 6.7 (H) 02/08/2018    Lipid Panel     Component Value Date/Time   CHOL 180 07/03/2019 1509   TRIG 85 07/03/2019 1509   HDL  68 07/03/2019 1509   CHOLHDL 2.6 07/03/2019 1509   VLDL 8 03/12/2016 1139   LDLCALC 94 07/03/2019 1509     Assessment & Plan:   1.  type 2 diabetes mellitus with stage 4 chronic kidney disease, with long-term current use of insulin ([place]) -his diabetes is  complicated by chronic kidney disease stage IV and patient remains at a high risk for more acute and chronic complications of diabetes which include CAD, CVA, CKD, retinopathy, and neuropathy. These are all discussed in detail with the patient.  -His  Wife who is his caretaker reports that his morning blood glucose readings are ranging between 100-153, before supper readings range from 173-200.  He did not have a chance to do his previsit labs.   -His previsit labs show A1c of 5.9%, improving from 7.5%.  7.5% A1c is target for him.   - I have re-counseled the patient on diet management   by adopting a carbohydrate restricted / protein rich  Diet. -He is advised to avoid excessively processed carbs including soda, ice cream, and sweet tea.   - Patient is advised to stick to a routine mealtimes to eat 3 meals  a day and avoid unnecessary snacks ( to snack only to correct hypoglycemia).  - I have approached patient with the following individualized plan to manage diabetes and patient agrees.  -I have advised patient and his wife to continue Novolin 70/30  10 units twice a day with breakfast and supper when pre-meal blood glucose is above 90 mg/dL, associated with monitoring of blood glucose at least 2 times a day- before breakfast and before supper.     - Patient is warned not to take insulin without proper monitoring per orders. -Adjustment parameters are given for hypo and hyperglycemia in writing. -Patient is encouraged to call clinic for blood glucose levels less than 70 or above 300 mg /dl. -Patient is not a candidate for metformin,SGLT2 inhibitors due to CKD.    2) BP/HTN: he is advised to home monitor  blood pressure and report if  > 140/90 on 2 separate readings.  He is not on antihypertensive treatment at this time.   3) Lipids/HPL: His lipid panel has been stable for 2 years.  He remains on pravastatin 20 mg p.o. nightly.   4) hypothyroidism:   -I advised him to continue levothyroxine 112 mcg p.o. daily before breakfast.     - We discussed about the correct intake of his thyroid hormone, on empty stomach at fasting, with water, separated by at least 30 minutes from breakfast and other medications,  and separated by more than 4 hours from calcium, iron, multivitamins, acid reflux medications (PPIs). -Patient is made aware of the fact that thyroid hormone replacement is needed for life, dose to be adjusted by periodic monitoring of thyroid function tests.   - I advised patient to maintain close follow up with [name], MD for primary care needs.  - Patient Care Time Today:  25 min, of which >50% was spent in  counseling and the rest reviewing his  current and  previous labs/studies, previous treatments, his blood glucose readings, and medications' doses and developing a plan for long-term care based on the latest recommendations for standards of care.   Chase [name] participated in the discussions, expressed understanding, and voiced agreement with the above plans.  All questions were answered to his satisfaction. he is encouraged to contact clinic should he have any questions or concerns prior to his return visit.   Follow up plan: -Return in about 6 months (around 01/31/2020), or logs 10, for Follow up with Pre-visit Labs, Meter, and Logs.  Glade Lloyd, MD Phone: [phone]  Fax: [phone]  This note was partially dictated with voice recognition software. Similar sounding words can be transcribed inadequately or may not  be corrected upon review.  07/31/2019, 3:35 PM

## 2019-08-01 DIAGNOSIS — R2689 Other abnormalities of gait and mobility: Secondary | ICD-10-CM | POA: Diagnosis not present

## 2019-08-01 DIAGNOSIS — E1142 Type 2 diabetes mellitus with diabetic polyneuropathy: Secondary | ICD-10-CM | POA: Diagnosis not present

## 2019-08-01 DIAGNOSIS — G40909 Epilepsy, unspecified, not intractable, without status epilepticus: Secondary | ICD-10-CM | POA: Diagnosis not present

## 2019-08-01 DIAGNOSIS — Z79899 Other long term (current) drug therapy: Secondary | ICD-10-CM | POA: Diagnosis not present

## 2019-08-07 ENCOUNTER — Ambulatory Visit (INDEPENDENT_AMBULATORY_CARE_PROVIDER_SITE_OTHER): Payer: Medicare Other

## 2019-08-07 ENCOUNTER — Other Ambulatory Visit: Payer: Self-pay

## 2019-08-07 DIAGNOSIS — Z23 Encounter for immunization: Secondary | ICD-10-CM | POA: Diagnosis not present

## 2019-08-17 ENCOUNTER — Other Ambulatory Visit: Payer: Self-pay | Admitting: Family Medicine

## 2019-09-05 ENCOUNTER — Telehealth: Payer: Self-pay

## 2019-09-05 NOTE — Telephone Encounter (Signed)
Spouse called and stated that when she got patient up to give him his bath he had a large bm and some of it was liquid. After he finished his bath he started feeling dizzy. Patient is feeling alright now. He will get the loose bm's and she will give him his medicine and it will clear it up and then it will start again. Could it be some of his medicine or something he is eating? Please advise.

## 2019-09-05 NOTE — Telephone Encounter (Signed)
Probably related to food eaten, so I advise her to monitor and see if specific foods cause GI distress, his meds are the same

## 2019-09-06 NOTE — Telephone Encounter (Signed)
Spoke with spouse and let her know Dr.Simpsons advice with verbal understanding.

## 2019-09-20 ENCOUNTER — Telehealth: Payer: Self-pay | Admitting: "Endocrinology

## 2019-09-20 MED ORDER — NOVOLIN 70/30 FLEXPEN RELION (70-30) 100 UNIT/ML ~~LOC~~ SUPN: PEN_INJECTOR | SUBCUTANEOUS | 0 refills | Status: DC

## 2019-09-20 MED ORDER — BD PEN NEEDLE SHORT U/F 31G X 8 MM MISC: 1.0000 | 1 refills | Status: DC

## 2019-09-20 NOTE — Telephone Encounter (Signed)
Rx sent 

## 2019-09-20 NOTE — Telephone Encounter (Signed)
Patient needs a refill on his needles and his Novolin. [facility]

## 2019-09-25 ENCOUNTER — Other Ambulatory Visit: Payer: Self-pay

## 2019-09-25 MED ORDER — NOVOLIN 70/30 FLEXPEN RELION (70-30) 100 UNIT/ML ~~LOC~~ SUPN: PEN_INJECTOR | SUBCUTANEOUS | 0 refills | Status: DC

## 2019-09-25 NOTE — Telephone Encounter (Signed)
Can you resend this?

## 2019-09-25 NOTE — Telephone Encounter (Signed)
Rx sent[facility]. Had to be switched to Humulin 70/30 for insurance to cover.

## 2019-09-26 NOTE — Telephone Encounter (Signed)
rx sent. Changed to Humulin 70/30 per insurance. [facility]

## 2019-09-27 ENCOUNTER — Telehealth: Payer: Self-pay | Admitting: *Deleted

## 2019-09-27 ENCOUNTER — Telehealth: Payer: Self-pay | Admitting: "Endocrinology

## 2019-09-27 MED ORDER — GLUCOSE BLOOD VI STRP: 1.0000 | ORAL_STRIP | Freq: Two times a day (BID) | 5 refills | Status: DC

## 2019-09-27 NOTE — Telephone Encounter (Signed)
Needs in house visit with that concern and to bring all the  meds he is taking

## 2019-09-27 NOTE — Telephone Encounter (Signed)
Do you want to order any labs and schedule him for a visit ov or/phone?

## 2019-09-27 NOTE — Telephone Encounter (Signed)
Pt wife [name] called wanting to know if pt could be taken off of some of the medications he is on as she is concerned about all the medications he is taking

## 2019-09-27 NOTE — Telephone Encounter (Signed)
Pt needs refill on test strips

## 2019-09-27 NOTE — Telephone Encounter (Signed)
Pt wife [name] called

## 2019-09-27 NOTE — Telephone Encounter (Signed)
Rx sent 

## 2019-09-28 ENCOUNTER — Ambulatory Visit (INDEPENDENT_AMBULATORY_CARE_PROVIDER_SITE_OTHER): Payer: Medicare Other | Admitting: Family Medicine

## 2019-09-28 ENCOUNTER — Other Ambulatory Visit: Payer: Self-pay

## 2019-09-28 ENCOUNTER — Encounter: Payer: Self-pay | Admitting: Family Medicine

## 2019-09-28 VITALS — BP 146/70 | HR 81 | Resp 12 | Ht 68.0 in | Wt 168.0 lb

## 2019-09-28 DIAGNOSIS — Z0000 Encounter for general adult medical examination without abnormal findings: Secondary | ICD-10-CM | POA: Diagnosis not present

## 2019-09-28 NOTE — Patient Instructions (Addendum)
Chase [name] , Thank you for taking time to come for your Medicare Wellness Visit. I appreciate your ongoing commitment to your health goals. Please review the following plan we discussed and let me know if I can assist you in the future.   Please continue to practice social distancing to keep you, your family, and our community safe.  If you must go out, please wear a Mask and practice good handwashing.  Screening recommendations/referrals: Colonoscopy: no longer needed Recommended yearly ophthalmology/optometry visit for glaucoma screening and checkup Recommended yearly dental visit for hygiene and checkup  Vaccinations: Influenza vaccine: Completed this years, due Fall 2021 Pneumococcal vaccine: completed Tdap vaccine: Due 2022 Shingles vaccine: check insurance coverage  Advanced directives: No   Conditions/risks identified: Fall  Next appointment: 01/03/2020   Preventive Care 2 Years and Older, Male Preventive care refers to lifestyle choices and visits with your health care provider that can promote health and wellness. What does preventive care include?  A yearly physical exam. This is also called an annual well check.  Dental exams once or twice a year.  Routine eye exams. Ask your health care provider how often you should have your eyes checked.  Personal lifestyle choices, including:  Daily care of your teeth and gums.  Regular physical activity.  Eating a healthy diet.  Avoiding tobacco and drug use.  Limiting alcohol use.  Practicing safe sex.  Taking low doses of aspirin every day.  Taking vitamin and mineral supplements as recommended by your health care provider. What happens during an annual well check? The services and screenings done by your health care provider during your annual well check will depend on your age, overall health, lifestyle risk factors, and family history of disease. Counseling  Your health care provider may ask you questions  about your:  Alcohol use.  Tobacco use.  Drug use.  Emotional well-being.  Home and relationship well-being.  Sexual activity.  Eating habits.  History of falls.  Memory and ability to understand (cognition).  Work and work Statistician. Screening  You may have the following tests or measurements:  Height, weight, and BMI.  Blood pressure.  Lipid and cholesterol levels. These may be checked every 5 years, or more frequently if you are over 52 years old.  Skin check.  Lung cancer screening. You may have this screening every year starting at age 73 if you have a 30-pack-year history of smoking and currently smoke or have quit within the past 15 years.  Fecal occult blood test (FOBT) of the stool. You may have this test every year starting at age 20.  Flexible sigmoidoscopy or colonoscopy. You may have a sigmoidoscopy every 5 years or a colonoscopy every 10 years starting at age 42.  Prostate cancer screening. Recommendations will vary depending on your family history and other risks.  Hepatitis C blood test.  Hepatitis B blood test.  Sexually transmitted disease (STD) testing.  Diabetes screening. This is done by checking your blood sugar (glucose) after you have not eaten for a while (fasting). You may have this done every 1-3 years.  Abdominal aortic aneurysm (AAA) screening. You may need this if you are a current or former smoker.  Osteoporosis. You may be screened starting at age 2 if you are at high risk. Talk with your health care provider about your test results, treatment options, and if necessary, the need for more tests. Vaccines  Your health care provider may recommend certain vaccines, such as:  Influenza vaccine. This  is recommended every year.  Tetanus, diphtheria, and acellular pertussis (Tdap, Td) vaccine. You may need a Td booster every 10 years.  Zoster vaccine. You may need this after age 10.  Pneumococcal 13-valent conjugate (PCV13)  vaccine. One dose is recommended after age 54.  Pneumococcal polysaccharide (PPSV23) vaccine. One dose is recommended after age 44. Talk to your health care provider about which screenings and vaccines you need and how often you need them. This information is not intended to replace advice given to you by your health care provider. Make sure you discuss any questions you have with your health care provider. Document Released: 12/20/2015 Document Revised: 08/12/2016 Document Reviewed: 09/24/2015 Elsevier Interactive Patient Education  2017 [place] Prevention in the Home Falls can cause injuries. They can happen to people of all ages. There are many things you can do to make your home safe and to help prevent falls. What can I do on the outside of my home?  Regularly fix the edges of walkways and driveways and fix any cracks.  Remove anything that might make you trip as you walk through a door, such as a raised step or threshold.  Trim any bushes or trees on the path to your home.  Use bright outdoor lighting.  Clear any walking paths of anything that might make someone trip, such as rocks or tools.  Regularly check to see if handrails are loose or broken. Make sure that both sides of any steps have handrails.  Any raised decks and porches should have guardrails on the edges.  Have any leaves, snow, or ice cleared regularly.  Use sand or salt on walking paths during winter.  Clean up any spills in your garage right away. This includes oil or grease spills. What can I do in the bathroom?  Use night lights.  Install grab bars by the toilet and in the tub and shower. Do not use towel bars as grab bars.  Use non-skid mats or decals in the tub or shower.  If you need to sit down in the shower, use a plastic, non-slip stool.  Keep the floor dry. Clean up any water that spills on the floor as soon as it happens.  Remove soap buildup in the tub or shower regularly.   Attach bath mats securely with double-sided non-slip rug tape.  Do not have throw rugs and other things on the floor that can make you trip. What can I do in the bedroom?  Use night lights.  Make sure that you have a light by your bed that is easy to reach.  Do not use any sheets or blankets that are too big for your bed. They should not hang down onto the floor.  Have a firm chair that has side arms. You can use this for support while you get dressed.  Do not have throw rugs and other things on the floor that can make you trip. What can I do in the kitchen?  Clean up any spills right away.  Avoid walking on wet floors.  Keep items that you use a lot in easy-to-reach places.  If you need to reach something above you, use a strong step stool that has a grab bar.  Keep electrical cords out of the way.  Do not use floor polish or wax that makes floors slippery. If you must use wax, use non-skid floor wax.  Do not have throw rugs and other things on the floor that can make you  trip. What can I do with my stairs?  Do not leave any items on the stairs.  Make sure that there are handrails on both sides of the stairs and use them. Fix handrails that are broken or loose. Make sure that handrails are as long as the stairways.  Check any carpeting to make sure that it is firmly attached to the stairs. Fix any carpet that is loose or worn.  Avoid having throw rugs at the top or bottom of the stairs. If you do have throw rugs, attach them to the floor with carpet tape.  Make sure that you have a light switch at the top of the stairs and the bottom of the stairs. If you do not have them, ask someone to add them for you. What else can I do to help prevent falls?  Wear shoes that:  Do not have high heels.  Have rubber bottoms.  Are comfortable and fit you well.  Are closed at the toe. Do not wear sandals.  If you use a stepladder:  Make sure that it is fully opened. Do not climb  a closed stepladder.  Make sure that both sides of the stepladder are locked into place.  Ask someone to hold it for you, if possible.  Clearly mark and make sure that you can see:  Any grab bars or handrails.  First and last steps.  Where the edge of each step is.  Use tools that help you move around (mobility aids) if they are needed. These include:  Canes.  Walkers.  Scooters.  Crutches.  Turn on the lights when you go into a dark area. Replace any light bulbs as soon as they burn out.  Set up your furniture so you have a clear path. Avoid moving your furniture around.  If any of your floors are uneven, fix them.  If there are any pets around you, be aware of where they are.  Review your medicines with your doctor. Some medicines can make you feel dizzy. This can increase your chance of falling. Ask your doctor what other things that you can do to help prevent falls. This information is not intended to replace advice given to you by your health care provider. Make sure you discuss any questions you have with your health care provider. Document Released: 09/19/2009 Document Revised: 04/30/2016 Document Reviewed: 12/28/2014 Elsevier Interactive Patient Education  2017 [employer].

## 2019-09-28 NOTE — Progress Notes (Signed)
Subjective:   Chase [name] is a 85 y.o. male who presents for Medicare Annual/Subsequent preventive examination.  Location of Patient: Home Location of Provider: Telehealth Consent was obtain for visit to be over via telehealth.  I verified that I am speaking with the correct person using two identifiers.   Review of Systems:    Cardiac Risk Factors include: advanced age (>57men, >75 women);diabetes mellitus;dyslipidemia;hypertension;male gender     Objective:    Vitals: BP (!) 146/70   Pulse 81   Resp 12   Ht 5\' 8"  (1.727 m)   Wt 168 lb (76.2 kg)   BMI 25.54 kg/m   Body mass index is 25.54 kg/m.  Advanced Directives 09/26/2018 09/14/2018 05/27/2018 05/23/2018 02/26/2018 10/06/2017 05/07/2017  Does Patient Have a Medical Advance Directive? No No No No No No No  Would patient like information on creating a medical advance directive? Yes (ED - Information included in AVS) - No - Patient declined Yes (MAU/Ambulatory/Procedural Areas - Information given) No - Patient declined Yes (MAU/Ambulatory/Procedural Areas - Information given) No - Patient declined  Pre-existing out of facility DNR order (yellow form or pink MOST form) - - - - - - -    Tobacco Social History   Tobacco Use  Smoking Status Never Smoker  Smokeless Tobacco Never Used     Counseling given: Yes   Clinical Intake:  Pre-visit preparation completed: Yes  Pain Score: 7      BMI - recorded: 25.54 Nutritional Status: BMI 25 -29 Overweight Nutritional Risks: None Diabetes: Yes CBG done?: No Did pt. bring in CBG monitor from home?: No  How often do you need to have someone help you when you read instructions, pamphlets, or other written materials from your doctor or pharmacy?: 1 - Never What is the last grade level you completed in school?: 8  Interpreter Needed?: No     Past Medical History:  Diagnosis Date  . Bradycardia 03/15/2012  . Carotid artery occlusion   . Choledocholithiasis 02/25/2018   . CKD (chronic kidney disease) stage 4, GFR 15-29 ml/min (HCC)   . Complete lesion of cervical spinal cord ([place]) 03/14/3012   Stable since 2006  . CVA (cerebrovascular accident) ([place]) 09/07/12   right sided weakness  . Depressive disorder, not elsewhere classified   . Diabetes mellitus approx 1994  . Diabetic neuropathy ([place])   . History of kidney stones   . Hypertensive heart disease   . Hypothyroidism approx 2000  . Lacunar stroke, acute ([place]) 03/14/2012  . Obesity   . Osteoarthrosis, unspecified whether generalized or localized, lower leg   . Other and unspecified hyperlipidemia   . Peripheral vascular disease, unspecified ([place])   . Seizures ([place])    unknown etiology; on meds, last seizure was 2015  . Spinal stenosis, unspecified region other than cervical   . Spondylosis of unspecified site without mention of myelopathy    Past Surgical History:  Procedure Laterality Date  . BILIARY STENT PLACEMENT N/A 05/27/2018   Procedure: BILIARY STENT PLACEMENT;  Surgeon: Rogene Houston, MD;  [facility];  Service: Endoscopy;  Laterality: N/A;  . COLONOSCOPY N/A 08/10/2013   Procedure: COLONOSCOPY;  Surgeon: Rogene Houston, MD;  [facility];  Service: Endoscopy;  Laterality: N/A;  240  . ERCP N/A 03/02/2018   Procedure: ENDOSCOPIC RETROGRADE CHOLANGIOPANCREATOGRAPHY (ERCP) With sphincterotomy and stent placement;  Surgeon: Rogene Houston, MD;  [facility];  Service: Gastroenterology;  Laterality: N/A;  .  ERCP N/A 05/27/2018   Procedure: ENDOSCOPIC RETROGRADE CHOLANGIOPANCREATOGRAPHY (ERCP);  Surgeon: Rogene Houston, MD;  [facility];  Service: Endoscopy;  Laterality: N/A;  . ERCP N/A 09/16/2018   Procedure: ENDOSCOPIC RETROGRADE CHOLANGIOPANCREATOGRAPHY (ERCP);  Surgeon: Rogene Houston, MD;  [facility];  Service: Endoscopy;  Laterality: N/A;  . GASTROINTESTINAL STENT REMOVAL N/A 05/27/2018   Procedure: Biliary STENT REMOVAL;   Surgeon: Rogene Houston, MD;  [facility];  Service: Endoscopy;  Laterality: N/A;  . GASTROINTESTINAL STENT REMOVAL N/A 09/16/2018   Procedure: GASTROINTESTINAL STENT REMOVAL;  Surgeon: Rogene Houston, MD;  [facility];  Service: Endoscopy;  Laterality: N/A;  . kidney stones left x2  1975  . KIDNEY SURGERY     Ruptured left kidney 30 yrs ago  from a kidney stone  . LITHOTRIPSY N/A 05/27/2018   Procedure: MECHANICAL LITHOTRIPSY;  Surgeon: Rogene Houston, MD;  [facility];  Service: Endoscopy;  Laterality: N/A;  . REMOVAL OF STONES N/A 09/16/2018   Procedure: REMOVAL OF MULTIPLE STONES WITH BASKET AND BALLOON;  Surgeon: Rogene Houston, MD;  [facility];  Service: Endoscopy;  Laterality: N/A;  . SPHINCTEROTOMY N/A 05/27/2018   Procedure: SPHINCTEROTOMY extended;  Surgeon: Rogene Houston, MD;  [facility];  Service: Endoscopy;  Laterality: N/A;  . SPYGLASS CHOLANGIOSCOPY N/A 05/27/2018   Procedure: NWGNFAOZ CHOLANGIOSCOPY;  Surgeon: Rogene Houston, MD;  [facility];  Service: Endoscopy;  Laterality: N/A;   Family History  Problem Relation Age of Onset  . Diabetes Mother   . Prostate cancer Father   . Hypertension Brother   . Hypertension Brother   . Diabetes Brother   . Stroke Brother   . Diabetes Brother   . Diabetes Daughter   . Diabetes Daughter    Social History   Socioeconomic History  . Marital status: Married    Spouse name: Not on file  . Number of children: 5  . Years of education: 8  . Highest education level: 8th grade  Occupational History  . Occupation: retired     Fish farm manager: RETIRED  Social Needs  . Financial resource strain: Somewhat hard  . Food insecurity    Worry: Never true    Inability: Never true  . Transportation needs    Medical: No    Non-medical: No  Tobacco Use  . Smoking status: Never Smoker  . Smokeless tobacco: Never Used  Substance and Sexual Activity  . Alcohol  use: No    Alcohol/week: 0.0 standard drinks  . Drug use: No  . Sexual activity: Not Currently  Lifestyle  . Physical activity    Days per week: 0 days    Minutes per session: 0 min  . Stress: Not at all  Relationships  . Social Herbalist on phone: Three times a week    Gets together: Twice a week    Attends religious service: Never    Active member of club or organization: No    Attends meetings of clubs or organizations: Not on file    Relationship status: Married  Other Topics Concern  . Not on file  Social History Narrative  . Not on file    Outpatient Encounter Medications as of 09/28/2019  Medication Sig  . amLODipine (NORVASC) 10 MG tablet Take 1 tablet (10 mg total) by mouth daily.  Marland Kitchen aspirin EC 81 MG tablet Take 1 tablet (81 mg total)  by mouth daily.  . calcium carbonate (TUMS - DOSED IN MG ELEMENTAL CALCIUM) 500 MG chewable tablet Chew 1 tablet by mouth 2 (two) times daily. For Bone Health  . clobetasol cream (TEMOVATE) 3.76 % Apply 1 application topically as directed.  . clopidogrel (PLAVIX) 75 MG tablet Take 1 tablet (75 mg total) by mouth daily.  Marland Kitchen desonide (DESOWEN) 0.05 % cream Apply 1 application topically as directed.  Marland Kitchen glucose blood test strip 1 each by Other route 2 (two) times daily. Use as instructed bid  . Insulin Isophane & Regular Human (NOVOLIN 70/30 FLEXPEN RELION) (70-30) 100 UNIT/ML PEN INJECT 10 UNITS SUBCUTANEOUSLY TWICE DAILY BEFORE  A  MEAL. May fill Humulin 70/30 flexpen  . Insulin Pen Needle (B-D ULTRAFINE III SHORT PEN) 31G X 8 MM MISC 1 each by Does not apply route as directed.  . levETIRAcetam (KEPPRA) 500 MG tablet Take 250 mg by mouth at bedtime.   Marland Kitchen levothyroxine (SYNTHROID) 112 MCG tablet Take 1 tablet (112 mcg total) by mouth daily before breakfast.  . Multiple Vitamins-Minerals (MULTIVITAMIN WITH MINERALS) tablet Take 1 tablet by mouth daily.  Marland Kitchen olopatadine (PATANOL) 0.1 % ophthalmic solution INSTILL 1 DROP INTO EACH EYE  TWICE DAILY  . ONETOUCH DELICA LANCETS 28B MISC   . pravastatin (PRAVACHOL) 20 MG tablet Take 1 tablet (20 mg total) by mouth daily.  . tamsulosin (FLOMAX) 0.4 MG CAPS capsule Take 0.4 mg by mouth daily.  . Vitamin D, Ergocalciferol, (DRISDOL) 1.25 MG (50000 UT) CAPS capsule TAKE 1 CAPSULE ONCE A WEEK   No facility-administered encounter medications on file as of 09/28/2019.     Activities of Daily Living In your present state of health, do you have any difficulty performing the following activities: 09/28/2019  Hearing? N  Vision? N  Difficulty concentrating or making decisions? Y  Walking or climbing stairs? Y  Dressing or bathing? Y  Doing errands, shopping? Y  Preparing Food and eating ? Y  Using the Toilet? Y  In the past six months, have you accidently leaked urine? Y  Do you have problems with loss of bowel control? Y  Managing your Medications? Y  Managing your Finances? Y  Housekeeping or managing your Housekeeping? Y  Some recent data might be hidden    Patient Care Team: [name], MD as PCP - General Herminio Commons, MD as PCP - Cardiology (Cardiology) Herminio Commons, MD as Attending Physician (Cardiology) Fran Lowes, MD as Consulting Physician (Nephrology) Phillips Odor, MD as Consulting Physician (Neurology) Cassandria Anger, MD as Consulting Physician (Endocrinology) Georganna Skeans, MD as Consulting Physician (General Surgery)   Assessment:   This is a routine wellness examination for [place].  Exercise Activities and Dietary recommendations Current Exercise Habits: The patient does not participate in regular exercise at present, Exercise limited by: None identified  Goals    . DIET - INCREASE LEAN PROTEINS    . Exercise 3x per week (30 min per time)     Recommend starting a routine exercise program at least 3 days a week for 30-45 minutes at a time as tolerated.     . Increase physical activity    . Patient Stated      I want to eat more and move around more       Fall Risk Fall Risk  09/28/2019 07/03/2019 12/20/2018 09/26/2018 08/17/2018  Falls in the past year? 0 1 0 Yes No  Number falls in past yr: 0 1 0 1 -  Injury with Fall? 0 1 0 No -  Risk for fall due to : - - - History of fall(s);Medication side effect;Impaired balance/gait;Impaired mobility;Impaired vision -  Follow up - - - Falls prevention discussed -   Is the patient's home free of loose throw rugs in walkways, pet beds, electrical cords, etc?   yes      Grab bars in the bathroom? yes      Handrails on the stairs?   yes      Adequate lighting?   yes     Depression Screen PHQ 2/9 Scores 09/28/2019 07/03/2019 12/20/2018 09/26/2018  PHQ - 2 Score 0 0 0 3  PHQ- 9 Score - - - 4    Cognitive Function MMSE - Mini Mental State Exam 10/06/2017  Orientation to time 1  Orientation to Place 5  Registration 3  Attention/ Calculation 4  Recall 1  Language- name 2 objects 2  Language- repeat 0  Language- follow 3 step command 3  Language- read & follow direction 1  Write a sentence 1  Copy design 1  Total score 22     6CIT Screen 09/28/2019 09/26/2018  What Year? 0 points 4 points  What month? 0 points 0 points  What time? 0 points 3 points  Count back from 20 0 points 0 points  Months in reverse 0 points 4 points  Repeat phrase 0 points 2 points  Total Score 0 13    Immunization History  Administered Date(s) Administered  . Fluad Quad(high Dose 65+) 08/07/2019  . H1N1 11/14/2008  . Influenza Split 10/07/2011, 09/08/2012  . Influenza Whole 08/22/2007, 08/27/2010  . Influenza,inj,Quad PF,6+ Mos 08/22/2013, 09/26/2014, 09/26/2015, 09/01/2016, 09/20/2017, 09/26/2018  . Pneumococcal Conjugate-13 06/12/2014  . Pneumococcal Polysaccharide-23 05/21/2004  . Td 05/21/2004  . Tdap 10/07/2011    Qualifies for Shingles Vaccine?  Checking coverage  Screening Tests Health Maintenance  Topic Date Due  . OPHTHALMOLOGY EXAM  05/06/2018   . URINE MICROALBUMIN  12/22/2019  . FOOT EXAM  01/02/2020  . HEMOGLOBIN A1C  01/03/2020  . TETANUS/TDAP  10/06/2021  . INFLUENZA VACCINE  Completed  . PNA vac Low Risk Adult  Completed   Cancer Screenings: Lung: Low Dose CT Chest recommended if Age 49-80 years, 30 pack-year currently smoking OR have quit w/in 15years. Patient does not qualify. Colorectal:  n/a  Additional Screenings:   Hepatitis C Screening:       Plan:       1. Encounter for Medicare annual wellness exam   I have personally reviewed and noted the following in the patient's chart:   . Medical and social history . Use of alcohol, tobacco or illicit drugs  . Current medications and supplements . Functional ability and status . Nutritional status . Physical activity . Advanced directives . List of other physicians . Hospitalizations, surgeries, and ER visits in previous 12 months . Vitals . Screenings to include cognitive, depression, and falls . Referrals and appointments  In addition, I have reviewed and discussed with patient certain preventive protocols, quality metrics, and best practice recommendations. A written personalized care plan for preventive services as well as general preventive health recommendations were provided to patient.    I provided 20 minutes of non-face-to-face time during this encounter.   Perlie Mayo, NP  09/28/2019

## 2019-10-02 ENCOUNTER — Ambulatory Visit: Payer: Medicare Other

## 2019-10-02 ENCOUNTER — Other Ambulatory Visit: Payer: Self-pay

## 2019-10-02 MED ORDER — GLUCOSE BLOOD VI STRP: 1.0000 | ORAL_STRIP | Freq: Two times a day (BID) | 5 refills | Status: DC

## 2019-10-02 MED ORDER — ONETOUCH DELICA LANCETS 33G MISC: 1.0000 | Freq: Two times a day (BID) | 2 refills | Status: DC

## 2019-10-11 NOTE — Telephone Encounter (Signed)
The Main issue is that the PT is having Loose bowel movements every morning and every night,

## 2019-10-16 NOTE — Telephone Encounter (Signed)
pls schedule appt in office with me in the next 1 to 2 weeks

## 2019-10-17 ENCOUNTER — Other Ambulatory Visit: Payer: Self-pay

## 2019-10-17 ENCOUNTER — Ambulatory Visit (INDEPENDENT_AMBULATORY_CARE_PROVIDER_SITE_OTHER): Payer: Medicare Other | Admitting: Family Medicine

## 2019-10-17 ENCOUNTER — Encounter: Payer: Self-pay | Admitting: Family Medicine

## 2019-10-17 VITALS — BP 128/64 | HR 56 | Temp 98.5°F | Resp 15 | Ht 68.0 in | Wt 168.0 lb

## 2019-10-17 DIAGNOSIS — E1021 Type 1 diabetes mellitus with diabetic nephropathy: Secondary | ICD-10-CM

## 2019-10-17 DIAGNOSIS — D539 Nutritional anemia, unspecified: Secondary | ICD-10-CM | POA: Diagnosis not present

## 2019-10-17 DIAGNOSIS — R195 Other fecal abnormalities: Secondary | ICD-10-CM | POA: Diagnosis not present

## 2019-10-17 DIAGNOSIS — R5381 Other malaise: Secondary | ICD-10-CM | POA: Diagnosis not present

## 2019-10-17 DIAGNOSIS — E782 Mixed hyperlipidemia: Secondary | ICD-10-CM | POA: Diagnosis not present

## 2019-10-17 DIAGNOSIS — I10 Essential (primary) hypertension: Secondary | ICD-10-CM | POA: Diagnosis not present

## 2019-10-17 DIAGNOSIS — D631 Anemia in chronic kidney disease: Secondary | ICD-10-CM

## 2019-10-17 DIAGNOSIS — E038 Other specified hypothyroidism: Secondary | ICD-10-CM

## 2019-10-17 DIAGNOSIS — N189 Chronic kidney disease, unspecified: Secondary | ICD-10-CM | POA: Diagnosis not present

## 2019-10-17 DIAGNOSIS — I150 Renovascular hypertension: Secondary | ICD-10-CM

## 2019-10-17 MED ORDER — DIPHENOXYLATE-ATROPINE 2.5-0.025 MG PO TABS: ORAL_TABLET | ORAL | 0 refills | Status: DC

## 2019-10-17 NOTE — Progress Notes (Signed)
Chase [name]     MRN: [id number]      DOB: [birth year]   HPI Mr. Chase [name] is here for follow up and re-evaluation of chronic medical conditions, medication management and review of any available recent lab and radiology data.  Preventive health is updated, specifically  Cancer screening and Immunization.   Wife c/o change in bowel movements in recent times, increased watery stool as well as occasional; balls Has concerns about reduced movement , requesting in home PT  Again Medications are reviewed at request of spouse to understand what they are for and to see if any can be discontinued Denies polyuria, polydipsia, blurred vision , or hypoglycemic episodes.     ROS Denies recent fever or chills. Denies sinus pressure, nasal congestion, ear pain or sore throat. Denies chest congestion, productive cough or wheezing. Denies chest pains, palpitations and leg swelling Denies abdominal pain, nausea, vomiting,diarrhea or constipation.   Denies dysuria, frequency, hesitancy or incontinence. C/o chronic  joint pain, swelling and limitation in mobility. Chronic , numbness,and lower extremity  tingling. Denies depression, anxiety or insomnia. Denies skin break down or rash.   PE  BP 128/64   Pulse (!) 56   Temp 98.5 F (36.9 C) (Temporal)   Resp 15   Ht 5\' 8"  (1.727 m)   Wt 168 lb (76.2 kg)   SpO2 99%   BMI 25.54 kg/m   Patient alert and in no cardiopulmonary distress.  HEENT: No facial asymmetry, EOMI,     Neck decreased ROM.  Chest: Clear to auscultation bilaterally.  CVS: S1, S2 no murmurs, no S3.Regular rate.  ABD: Soft non tender.   Ext: No edema  MS: decreased  ROM spine, shoulders, hips and knees.  Skin: Intact, no ulcerations or rash noted.  Psych: Good eye contact, normal affect. Memory intact not anxious or depressed appearing.  CNS: CN 2-12 intact, power,  normal throughout.no focal deficits noted.   Assessment & Plan  Renovascular hypertension  Controlled, no change in medication   Hypothyroidism Managed by endo. Controlled, no change in medication   Mixed hyperlipidemia Hyperlipidemia:Low fat diet discussed and encouraged.   Lipid Panel  Lab Results  Component Value Date   CHOL 180 07/03/2019   HDL 68 07/03/2019   LDLCALC 94 07/03/2019   TRIG 85 07/03/2019   CHOLHDL 2.6 07/03/2019   Controlled, no change in medication     Physical deconditioning Decreased mobility with osteoarthritis in multiple jointsand high fal;l risk, refer  fro in home PT x 6 weesk, has benefited in the past   Loose stools Little response to OTC immodium, pt to use lomotil  Type 1 diabetes mellitus with diabetic nephropathy Chase [name]) Mr. Chase [name] is reminded of the importance of commitment to daily physical activity for 30 minutes or more, as able and the need to limit carbohydrate intake to 30 to 60 grams per meal to help with blood sugar control.   The need to take medication as prescribed, test blood sugar as directed, and to call between visits if there is a concern that blood sugar is uncontrolled is also discussed.   Mr. Chase [name] is reminded of the importance of daily foot exam, annual eye examination, and good blood sugar, blood pressure and cholesterol control. Appears over corrected, managed by Endo, Updated lab needed at/ before next visit.   Diabetic Labs Latest Ref Rng & Units 10/17/2019 07/03/2019 12/21/2018 09/14/2018 06/02/2018  HbA1c <5.7 % of total Hgb - 5.9(H) - - 7.5(H)  Microalbumin Not  Estab. ug/mL - - 491.2(H) - -  Micro/Creat Ratio 0.0 - 30.0 mg/g creat - - 269.9(H) - -  Chol <200 mg/dL - 180 - - 212(H)  HDL > OR = 40 mg/dL - 68 - - 67  Calc LDL mg/dL (calc) - 94 - - 126(H)  Triglycerides <150 mg/dL - 85 - - 93  Creatinine 0.70 - 1.11 mg/dL 3.78(H) 3.55(H) - 3.65(H) 3.56(H)   BP/Weight 10/17/2019 09/28/2019 07/03/2019 04/12/2019 12/20/2018 11/16/2018 07/86/7544  Systolic BP 920 100 712 197 588 325 498  Diastolic BP 64 70 70  72 72 78 67  Wt. (Lbs) 168 168 168 173 173 172.2 172  BMI 25.54 25.54 25.54 26.3 26.3 26.18 26.15   Foot/eye exam completion dates Latest Ref Rng & Units 12/20/2018 02/25/2017  Eye Exam No Retinopathy - -  Foot exam Order - - -  Foot Form Completion - Done Done

## 2019-10-17 NOTE — Patient Instructions (Addendum)
F/U in office with MD early March , call if you need me sooner  Please stop immodium, use lomotil one every other day if needed for loose bowel  Return 3 stool cards for testing  CBC, cmp and EGFR today  You are referred for in home PT  for strengthening assistance with ambulation  Thanks for choosing [facility], we consider it a privelige to serve you.

## 2019-10-19 LAB — FERRITIN: Ferritin: 171 ng/mL (ref 24–380)

## 2019-10-19 LAB — COMPLETE METABOLIC PANEL WITH GFR
AG Ratio: 1.3 (calc) (ref 1.0–2.5)
ALT: 8 U/L — ABNORMAL LOW (ref 9–46)
AST: 20 U/L (ref 10–35)
Albumin: 3.9 g/dL (ref 3.6–5.1)
Alkaline phosphatase (APISO): 74 U/L (ref 35–144)
BUN/Creatinine Ratio: 13 (calc) (ref 6–22)
BUN: 49 mg/dL — ABNORMAL HIGH (ref 7–25)
CO2: 25 mmol/L (ref 20–32)
Calcium: 9.7 mg/dL (ref 8.6–10.3)
Chloride: 109 mmol/L (ref 98–110)
Creat: 3.78 mg/dL — ABNORMAL HIGH (ref 0.70–1.11)
GFR, Est African American: 16 mL/min/{1.73_m2} — ABNORMAL LOW (ref >=60)
GFR, Est Non African American: 14 mL/min/{1.73_m2} — ABNORMAL LOW (ref >=60)
Globulin: 2.9 g/dL (calc) (ref 1.9–3.7)
Glucose, Bld: 217 mg/dL — ABNORMAL HIGH (ref 65–139)
Potassium: 4.7 mmol/L (ref 3.5–5.3)
Sodium: 141 mmol/L (ref 135–146)
Total Bilirubin: 0.4 mg/dL (ref 0.2–1.2)
Total Protein: 6.8 g/dL (ref 6.1–8.1)

## 2019-10-19 LAB — CBC
HCT: 28 % — ABNORMAL LOW (ref 38.5–50.0)
Hemoglobin: 9.3 g/dL — ABNORMAL LOW (ref 13.2–17.1)
MCH: 31.8 pg (ref 27.0–33.0)
MCHC: 33.2 g/dL (ref 32.0–36.0)
MCV: 95.9 fL (ref 80.0–100.0)
MPV: 10.2 fL (ref 7.5–12.5)
Platelets: 222 10*3/uL (ref 140–400)
RBC: 2.92 10*6/uL — ABNORMAL LOW (ref 4.20–5.80)
RDW: 13.1 % (ref 11.0–15.0)
WBC: 5.8 10*3/uL (ref 3.8–10.8)

## 2019-10-19 LAB — TEST AUTHORIZATION

## 2019-10-19 LAB — IRON: Iron: 63 ug/dL (ref 50–180)

## 2019-10-23 ENCOUNTER — Telehealth (INDEPENDENT_AMBULATORY_CARE_PROVIDER_SITE_OTHER): Payer: Medicare Other

## 2019-10-23 DIAGNOSIS — Z1211 Encounter for screening for malignant neoplasm of colon: Secondary | ICD-10-CM

## 2019-10-23 LAB — HEMOCCULT GUIAC POC 1CARD (OFFICE)
Card #2 Fecal Occult Blod, POC: POSITIVE
Card #3 Fecal Occult Blood, POC: POSITIVE
Fecal Occult Blood, POC: POSITIVE — AB

## 2019-10-23 NOTE — Telephone Encounter (Signed)
Occult blood entered

## 2019-10-25 ENCOUNTER — Telehealth: Payer: Self-pay

## 2019-10-25 DIAGNOSIS — R195 Other fecal abnormalities: Secondary | ICD-10-CM

## 2019-10-25 DIAGNOSIS — R194 Change in bowel habit: Secondary | ICD-10-CM

## 2019-10-25 NOTE — Telephone Encounter (Signed)
GI referral entered per MD orders. Spouse aware of referral being made and in agreement of treatment plan

## 2019-10-28 DIAGNOSIS — R195 Other fecal abnormalities: Secondary | ICD-10-CM | POA: Insufficient documentation

## 2019-10-28 NOTE — Assessment & Plan Note (Signed)
Decreased mobility with osteoarthritis in multiple jointsand high fal;l risk, refer  fro in home PT x 6 weesk, has benefited in the past

## 2019-10-28 NOTE — Assessment & Plan Note (Signed)
Managed by endo. Controlled, no change in medication

## 2019-10-28 NOTE — Assessment & Plan Note (Signed)
Chase [name] is reminded of the importance of commitment to daily physical activity for 30 minutes or more, as able and the need to limit carbohydrate intake to 30 to 60 grams per meal to help with blood sugar control.   The need to take medication as prescribed, test blood sugar as directed, and to call between visits if there is a concern that blood sugar is uncontrolled is also discussed.   Chase [name] is reminded of the importance of daily foot exam, annual eye examination, and good blood sugar, blood pressure and cholesterol control. Appears over corrected, managed by Endo, Updated lab needed at/ before next visit.   Diabetic Labs Latest Ref Rng & Units 10/17/2019 07/03/2019 12/21/2018 09/14/2018 06/02/2018  HbA1c <5.7 % of total Hgb - 5.9(H) - - 7.5(H)  Microalbumin Not Estab. ug/mL - - 491.2(H) - -  Micro/Creat Ratio 0.0 - 30.0 mg/g creat - - 269.9(H) - -  Chol <200 mg/dL - 180 - - 212(H)  HDL > OR = 40 mg/dL - 68 - - 67  Calc LDL mg/dL (calc) - 94 - - 126(H)  Triglycerides <150 mg/dL - 85 - - 93  Creatinine 0.70 - 1.11 mg/dL 3.78(H) 3.55(H) - 3.65(H) 3.56(H)   BP/Weight 10/17/2019 09/28/2019 07/03/2019 04/12/2019 12/20/2018 11/16/2018 35/59/7416  Systolic BP 384 536 468 032 122 482 500  Diastolic BP 64 70 70 72 72 78 67  Wt. (Lbs) 168 168 168 173 173 172.2 172  BMI 25.54 25.54 25.54 26.3 26.3 26.18 26.15   Foot/eye exam completion dates Latest Ref Rng & Units 12/20/2018 02/25/2017  Eye Exam No Retinopathy - -  Foot exam Order - - -  Foot Form Completion - Done Done

## 2019-10-28 NOTE — Assessment & Plan Note (Signed)
Controlled, no change in medication  

## 2019-10-28 NOTE — Assessment & Plan Note (Signed)
Little response to OTC immodium, pt to use lomotil

## 2019-10-28 NOTE — Assessment & Plan Note (Signed)
Hyperlipidemia:Low fat diet discussed and encouraged.   Lipid Panel  Lab Results  Component Value Date   CHOL 180 07/03/2019   HDL 68 07/03/2019   LDLCALC 94 07/03/2019   TRIG 85 07/03/2019   CHOLHDL 2.6 07/03/2019  Controlled, no change in medication     

## 2019-10-30 ENCOUNTER — Telehealth: Payer: Self-pay

## 2019-10-30 DIAGNOSIS — R262 Difficulty in walking, not elsewhere classified: Secondary | ICD-10-CM

## 2019-10-30 DIAGNOSIS — Z9181 History of falling: Secondary | ICD-10-CM

## 2019-10-30 DIAGNOSIS — M1712 Unilateral primary osteoarthritis, left knee: Secondary | ICD-10-CM

## 2019-10-30 NOTE — Telephone Encounter (Signed)
-----   Message from [name], MD sent at 10/28/2019  6:24 PM EST ----- Regarding: pls refer to in home pt x 6 wweesk, see most recen ov, thanks

## 2019-11-01 DIAGNOSIS — E039 Hypothyroidism, unspecified: Secondary | ICD-10-CM | POA: Diagnosis not present

## 2019-11-01 DIAGNOSIS — N184 Chronic kidney disease, stage 4 (severe): Secondary | ICD-10-CM | POA: Diagnosis not present

## 2019-11-01 DIAGNOSIS — D631 Anemia in chronic kidney disease: Secondary | ICD-10-CM | POA: Diagnosis not present

## 2019-11-01 DIAGNOSIS — M479 Spondylosis, unspecified: Secondary | ICD-10-CM | POA: Diagnosis not present

## 2019-11-01 DIAGNOSIS — Z7982 Long term (current) use of aspirin: Secondary | ICD-10-CM | POA: Diagnosis not present

## 2019-11-01 DIAGNOSIS — E782 Mixed hyperlipidemia: Secondary | ICD-10-CM | POA: Diagnosis not present

## 2019-11-01 DIAGNOSIS — E1051 Type 1 diabetes mellitus with diabetic peripheral angiopathy without gangrene: Secondary | ICD-10-CM | POA: Diagnosis not present

## 2019-11-01 DIAGNOSIS — I150 Renovascular hypertension: Secondary | ICD-10-CM | POA: Diagnosis not present

## 2019-11-01 DIAGNOSIS — Z8673 Personal history of transient ischemic attack (TIA), and cerebral infarction without residual deficits: Secondary | ICD-10-CM | POA: Diagnosis not present

## 2019-11-01 DIAGNOSIS — E1043 Type 1 diabetes mellitus with diabetic autonomic (poly)neuropathy: Secondary | ICD-10-CM | POA: Diagnosis not present

## 2019-11-01 DIAGNOSIS — Z7902 Long term (current) use of antithrombotics/antiplatelets: Secondary | ICD-10-CM | POA: Diagnosis not present

## 2019-11-01 DIAGNOSIS — M4800 Spinal stenosis, site unspecified: Secondary | ICD-10-CM | POA: Diagnosis not present

## 2019-11-01 DIAGNOSIS — E1022 Type 1 diabetes mellitus with diabetic chronic kidney disease: Secondary | ICD-10-CM | POA: Diagnosis not present

## 2019-11-01 DIAGNOSIS — M1712 Unilateral primary osteoarthritis, left knee: Secondary | ICD-10-CM | POA: Diagnosis not present

## 2019-11-01 DIAGNOSIS — I872 Venous insufficiency (chronic) (peripheral): Secondary | ICD-10-CM | POA: Diagnosis not present

## 2019-11-01 DIAGNOSIS — G40209 Localization-related (focal) (partial) symptomatic epilepsy and epileptic syndromes with complex partial seizures, not intractable, without status epilepticus: Secondary | ICD-10-CM | POA: Diagnosis not present

## 2019-11-01 DIAGNOSIS — Z9181 History of falling: Secondary | ICD-10-CM | POA: Diagnosis not present

## 2019-11-07 DIAGNOSIS — M1712 Unilateral primary osteoarthritis, left knee: Secondary | ICD-10-CM | POA: Diagnosis not present

## 2019-11-07 DIAGNOSIS — M479 Spondylosis, unspecified: Secondary | ICD-10-CM | POA: Diagnosis not present

## 2019-11-07 DIAGNOSIS — N184 Chronic kidney disease, stage 4 (severe): Secondary | ICD-10-CM | POA: Diagnosis not present

## 2019-11-07 DIAGNOSIS — M4800 Spinal stenosis, site unspecified: Secondary | ICD-10-CM | POA: Diagnosis not present

## 2019-11-07 DIAGNOSIS — E1043 Type 1 diabetes mellitus with diabetic autonomic (poly)neuropathy: Secondary | ICD-10-CM | POA: Diagnosis not present

## 2019-11-07 DIAGNOSIS — E1051 Type 1 diabetes mellitus with diabetic peripheral angiopathy without gangrene: Secondary | ICD-10-CM | POA: Diagnosis not present

## 2019-11-07 DIAGNOSIS — D631 Anemia in chronic kidney disease: Secondary | ICD-10-CM | POA: Diagnosis not present

## 2019-11-07 DIAGNOSIS — I150 Renovascular hypertension: Secondary | ICD-10-CM | POA: Diagnosis not present

## 2019-11-07 DIAGNOSIS — E1022 Type 1 diabetes mellitus with diabetic chronic kidney disease: Secondary | ICD-10-CM | POA: Diagnosis not present

## 2019-11-08 ENCOUNTER — Telehealth: Payer: Self-pay | Admitting: *Deleted

## 2019-11-08 NOTE — Telephone Encounter (Signed)
[place] therapist with [facility] called needing verbal orders to move pts evaluation for OT to next week due to families request. She can be reached at [phone]

## 2019-11-08 NOTE — Telephone Encounter (Signed)
Spoke with Sharyn Lull and gave verbal order to postpone evaluation due to family request.

## 2019-11-08 NOTE — Telephone Encounter (Signed)
Let generic message for Sharyn Lull to call the office back.

## 2019-11-09 DIAGNOSIS — E1051 Type 1 diabetes mellitus with diabetic peripheral angiopathy without gangrene: Secondary | ICD-10-CM | POA: Diagnosis not present

## 2019-11-09 DIAGNOSIS — M479 Spondylosis, unspecified: Secondary | ICD-10-CM | POA: Diagnosis not present

## 2019-11-09 DIAGNOSIS — I150 Renovascular hypertension: Secondary | ICD-10-CM | POA: Diagnosis not present

## 2019-11-09 DIAGNOSIS — E1043 Type 1 diabetes mellitus with diabetic autonomic (poly)neuropathy: Secondary | ICD-10-CM | POA: Diagnosis not present

## 2019-11-09 DIAGNOSIS — M4800 Spinal stenosis, site unspecified: Secondary | ICD-10-CM | POA: Diagnosis not present

## 2019-11-09 DIAGNOSIS — Z9181 History of falling: Secondary | ICD-10-CM

## 2019-11-09 DIAGNOSIS — Z7982 Long term (current) use of aspirin: Secondary | ICD-10-CM

## 2019-11-09 DIAGNOSIS — N184 Chronic kidney disease, stage 4 (severe): Secondary | ICD-10-CM | POA: Diagnosis not present

## 2019-11-09 DIAGNOSIS — D631 Anemia in chronic kidney disease: Secondary | ICD-10-CM | POA: Diagnosis not present

## 2019-11-09 DIAGNOSIS — I872 Venous insufficiency (chronic) (peripheral): Secondary | ICD-10-CM | POA: Diagnosis not present

## 2019-11-09 DIAGNOSIS — M1712 Unilateral primary osteoarthritis, left knee: Secondary | ICD-10-CM | POA: Diagnosis not present

## 2019-11-09 DIAGNOSIS — Z8673 Personal history of transient ischemic attack (TIA), and cerebral infarction without residual deficits: Secondary | ICD-10-CM

## 2019-11-09 DIAGNOSIS — Z7902 Long term (current) use of antithrombotics/antiplatelets: Secondary | ICD-10-CM

## 2019-11-09 DIAGNOSIS — E1022 Type 1 diabetes mellitus with diabetic chronic kidney disease: Secondary | ICD-10-CM | POA: Diagnosis not present

## 2019-11-09 DIAGNOSIS — E782 Mixed hyperlipidemia: Secondary | ICD-10-CM | POA: Diagnosis not present

## 2019-11-09 DIAGNOSIS — E039 Hypothyroidism, unspecified: Secondary | ICD-10-CM | POA: Diagnosis not present

## 2019-11-09 DIAGNOSIS — G40209 Localization-related (focal) (partial) symptomatic epilepsy and epileptic syndromes with complex partial seizures, not intractable, without status epilepticus: Secondary | ICD-10-CM

## 2019-11-10 DIAGNOSIS — M1712 Unilateral primary osteoarthritis, left knee: Secondary | ICD-10-CM | POA: Diagnosis not present

## 2019-11-10 DIAGNOSIS — E1022 Type 1 diabetes mellitus with diabetic chronic kidney disease: Secondary | ICD-10-CM | POA: Diagnosis not present

## 2019-11-10 DIAGNOSIS — D631 Anemia in chronic kidney disease: Secondary | ICD-10-CM | POA: Diagnosis not present

## 2019-11-10 DIAGNOSIS — E1051 Type 1 diabetes mellitus with diabetic peripheral angiopathy without gangrene: Secondary | ICD-10-CM | POA: Diagnosis not present

## 2019-11-10 DIAGNOSIS — M479 Spondylosis, unspecified: Secondary | ICD-10-CM | POA: Diagnosis not present

## 2019-11-10 DIAGNOSIS — E1043 Type 1 diabetes mellitus with diabetic autonomic (poly)neuropathy: Secondary | ICD-10-CM | POA: Diagnosis not present

## 2019-11-10 DIAGNOSIS — N184 Chronic kidney disease, stage 4 (severe): Secondary | ICD-10-CM | POA: Diagnosis not present

## 2019-11-10 DIAGNOSIS — M4800 Spinal stenosis, site unspecified: Secondary | ICD-10-CM | POA: Diagnosis not present

## 2019-11-10 DIAGNOSIS — I150 Renovascular hypertension: Secondary | ICD-10-CM | POA: Diagnosis not present

## 2019-11-13 DIAGNOSIS — E1043 Type 1 diabetes mellitus with diabetic autonomic (poly)neuropathy: Secondary | ICD-10-CM | POA: Diagnosis not present

## 2019-11-13 DIAGNOSIS — M479 Spondylosis, unspecified: Secondary | ICD-10-CM | POA: Diagnosis not present

## 2019-11-13 DIAGNOSIS — N184 Chronic kidney disease, stage 4 (severe): Secondary | ICD-10-CM | POA: Diagnosis not present

## 2019-11-13 DIAGNOSIS — E1051 Type 1 diabetes mellitus with diabetic peripheral angiopathy without gangrene: Secondary | ICD-10-CM | POA: Diagnosis not present

## 2019-11-13 DIAGNOSIS — I150 Renovascular hypertension: Secondary | ICD-10-CM | POA: Diagnosis not present

## 2019-11-13 DIAGNOSIS — M4800 Spinal stenosis, site unspecified: Secondary | ICD-10-CM | POA: Diagnosis not present

## 2019-11-13 DIAGNOSIS — E1022 Type 1 diabetes mellitus with diabetic chronic kidney disease: Secondary | ICD-10-CM | POA: Diagnosis not present

## 2019-11-13 DIAGNOSIS — D631 Anemia in chronic kidney disease: Secondary | ICD-10-CM | POA: Diagnosis not present

## 2019-11-13 DIAGNOSIS — M1712 Unilateral primary osteoarthritis, left knee: Secondary | ICD-10-CM | POA: Diagnosis not present

## 2019-11-16 DIAGNOSIS — M1712 Unilateral primary osteoarthritis, left knee: Secondary | ICD-10-CM | POA: Diagnosis not present

## 2019-11-16 DIAGNOSIS — D631 Anemia in chronic kidney disease: Secondary | ICD-10-CM | POA: Diagnosis not present

## 2019-11-16 DIAGNOSIS — N184 Chronic kidney disease, stage 4 (severe): Secondary | ICD-10-CM | POA: Diagnosis not present

## 2019-11-16 DIAGNOSIS — E1051 Type 1 diabetes mellitus with diabetic peripheral angiopathy without gangrene: Secondary | ICD-10-CM | POA: Diagnosis not present

## 2019-11-16 DIAGNOSIS — M479 Spondylosis, unspecified: Secondary | ICD-10-CM | POA: Diagnosis not present

## 2019-11-16 DIAGNOSIS — I150 Renovascular hypertension: Secondary | ICD-10-CM | POA: Diagnosis not present

## 2019-11-16 DIAGNOSIS — E1043 Type 1 diabetes mellitus with diabetic autonomic (poly)neuropathy: Secondary | ICD-10-CM | POA: Diagnosis not present

## 2019-11-16 DIAGNOSIS — M4800 Spinal stenosis, site unspecified: Secondary | ICD-10-CM | POA: Diagnosis not present

## 2019-11-16 DIAGNOSIS — E1022 Type 1 diabetes mellitus with diabetic chronic kidney disease: Secondary | ICD-10-CM | POA: Diagnosis not present

## 2019-11-20 DIAGNOSIS — M4800 Spinal stenosis, site unspecified: Secondary | ICD-10-CM | POA: Diagnosis not present

## 2019-11-20 DIAGNOSIS — M1712 Unilateral primary osteoarthritis, left knee: Secondary | ICD-10-CM | POA: Diagnosis not present

## 2019-11-20 DIAGNOSIS — D631 Anemia in chronic kidney disease: Secondary | ICD-10-CM | POA: Diagnosis not present

## 2019-11-20 DIAGNOSIS — E1043 Type 1 diabetes mellitus with diabetic autonomic (poly)neuropathy: Secondary | ICD-10-CM | POA: Diagnosis not present

## 2019-11-20 DIAGNOSIS — M479 Spondylosis, unspecified: Secondary | ICD-10-CM | POA: Diagnosis not present

## 2019-11-20 DIAGNOSIS — E1051 Type 1 diabetes mellitus with diabetic peripheral angiopathy without gangrene: Secondary | ICD-10-CM | POA: Diagnosis not present

## 2019-11-20 DIAGNOSIS — E1022 Type 1 diabetes mellitus with diabetic chronic kidney disease: Secondary | ICD-10-CM | POA: Diagnosis not present

## 2019-11-20 DIAGNOSIS — I150 Renovascular hypertension: Secondary | ICD-10-CM | POA: Diagnosis not present

## 2019-11-20 DIAGNOSIS — N184 Chronic kidney disease, stage 4 (severe): Secondary | ICD-10-CM | POA: Diagnosis not present

## 2019-11-21 DIAGNOSIS — E1022 Type 1 diabetes mellitus with diabetic chronic kidney disease: Secondary | ICD-10-CM | POA: Diagnosis not present

## 2019-11-21 DIAGNOSIS — N184 Chronic kidney disease, stage 4 (severe): Secondary | ICD-10-CM | POA: Diagnosis not present

## 2019-11-21 DIAGNOSIS — M479 Spondylosis, unspecified: Secondary | ICD-10-CM | POA: Diagnosis not present

## 2019-11-21 DIAGNOSIS — D631 Anemia in chronic kidney disease: Secondary | ICD-10-CM | POA: Diagnosis not present

## 2019-11-21 DIAGNOSIS — M1712 Unilateral primary osteoarthritis, left knee: Secondary | ICD-10-CM | POA: Diagnosis not present

## 2019-11-21 DIAGNOSIS — E1043 Type 1 diabetes mellitus with diabetic autonomic (poly)neuropathy: Secondary | ICD-10-CM | POA: Diagnosis not present

## 2019-11-21 DIAGNOSIS — I150 Renovascular hypertension: Secondary | ICD-10-CM | POA: Diagnosis not present

## 2019-11-21 DIAGNOSIS — E1051 Type 1 diabetes mellitus with diabetic peripheral angiopathy without gangrene: Secondary | ICD-10-CM | POA: Diagnosis not present

## 2019-11-21 DIAGNOSIS — M4800 Spinal stenosis, site unspecified: Secondary | ICD-10-CM | POA: Diagnosis not present

## 2019-11-22 DIAGNOSIS — E1043 Type 1 diabetes mellitus with diabetic autonomic (poly)neuropathy: Secondary | ICD-10-CM | POA: Diagnosis not present

## 2019-11-22 DIAGNOSIS — D631 Anemia in chronic kidney disease: Secondary | ICD-10-CM | POA: Diagnosis not present

## 2019-11-22 DIAGNOSIS — M4800 Spinal stenosis, site unspecified: Secondary | ICD-10-CM | POA: Diagnosis not present

## 2019-11-22 DIAGNOSIS — I150 Renovascular hypertension: Secondary | ICD-10-CM | POA: Diagnosis not present

## 2019-11-22 DIAGNOSIS — E1022 Type 1 diabetes mellitus with diabetic chronic kidney disease: Secondary | ICD-10-CM | POA: Diagnosis not present

## 2019-11-22 DIAGNOSIS — N184 Chronic kidney disease, stage 4 (severe): Secondary | ICD-10-CM | POA: Diagnosis not present

## 2019-11-22 DIAGNOSIS — M1712 Unilateral primary osteoarthritis, left knee: Secondary | ICD-10-CM | POA: Diagnosis not present

## 2019-11-22 DIAGNOSIS — M479 Spondylosis, unspecified: Secondary | ICD-10-CM | POA: Diagnosis not present

## 2019-11-22 DIAGNOSIS — E1051 Type 1 diabetes mellitus with diabetic peripheral angiopathy without gangrene: Secondary | ICD-10-CM | POA: Diagnosis not present

## 2019-11-23 DIAGNOSIS — M479 Spondylosis, unspecified: Secondary | ICD-10-CM | POA: Diagnosis not present

## 2019-11-23 DIAGNOSIS — M1712 Unilateral primary osteoarthritis, left knee: Secondary | ICD-10-CM | POA: Diagnosis not present

## 2019-11-23 DIAGNOSIS — I150 Renovascular hypertension: Secondary | ICD-10-CM | POA: Diagnosis not present

## 2019-11-23 DIAGNOSIS — E1051 Type 1 diabetes mellitus with diabetic peripheral angiopathy without gangrene: Secondary | ICD-10-CM | POA: Diagnosis not present

## 2019-11-23 DIAGNOSIS — N184 Chronic kidney disease, stage 4 (severe): Secondary | ICD-10-CM | POA: Diagnosis not present

## 2019-11-23 DIAGNOSIS — D631 Anemia in chronic kidney disease: Secondary | ICD-10-CM | POA: Diagnosis not present

## 2019-11-23 DIAGNOSIS — E1043 Type 1 diabetes mellitus with diabetic autonomic (poly)neuropathy: Secondary | ICD-10-CM | POA: Diagnosis not present

## 2019-11-23 DIAGNOSIS — M4800 Spinal stenosis, site unspecified: Secondary | ICD-10-CM | POA: Diagnosis not present

## 2019-11-23 DIAGNOSIS — E1022 Type 1 diabetes mellitus with diabetic chronic kidney disease: Secondary | ICD-10-CM | POA: Diagnosis not present

## 2019-11-24 DIAGNOSIS — E1043 Type 1 diabetes mellitus with diabetic autonomic (poly)neuropathy: Secondary | ICD-10-CM | POA: Diagnosis not present

## 2019-11-24 DIAGNOSIS — M1712 Unilateral primary osteoarthritis, left knee: Secondary | ICD-10-CM | POA: Diagnosis not present

## 2019-11-24 DIAGNOSIS — I150 Renovascular hypertension: Secondary | ICD-10-CM | POA: Diagnosis not present

## 2019-11-24 DIAGNOSIS — E1022 Type 1 diabetes mellitus with diabetic chronic kidney disease: Secondary | ICD-10-CM | POA: Diagnosis not present

## 2019-11-24 DIAGNOSIS — D631 Anemia in chronic kidney disease: Secondary | ICD-10-CM | POA: Diagnosis not present

## 2019-11-24 DIAGNOSIS — N184 Chronic kidney disease, stage 4 (severe): Secondary | ICD-10-CM | POA: Diagnosis not present

## 2019-11-24 DIAGNOSIS — E1051 Type 1 diabetes mellitus with diabetic peripheral angiopathy without gangrene: Secondary | ICD-10-CM | POA: Diagnosis not present

## 2019-11-24 DIAGNOSIS — M4800 Spinal stenosis, site unspecified: Secondary | ICD-10-CM | POA: Diagnosis not present

## 2019-11-24 DIAGNOSIS — M479 Spondylosis, unspecified: Secondary | ICD-10-CM | POA: Diagnosis not present

## 2019-11-26 ENCOUNTER — Other Ambulatory Visit: Payer: Self-pay | Admitting: Family Medicine

## 2019-11-27 DIAGNOSIS — I150 Renovascular hypertension: Secondary | ICD-10-CM | POA: Diagnosis not present

## 2019-11-27 DIAGNOSIS — D631 Anemia in chronic kidney disease: Secondary | ICD-10-CM | POA: Diagnosis not present

## 2019-11-27 DIAGNOSIS — E1022 Type 1 diabetes mellitus with diabetic chronic kidney disease: Secondary | ICD-10-CM | POA: Diagnosis not present

## 2019-11-27 DIAGNOSIS — E1051 Type 1 diabetes mellitus with diabetic peripheral angiopathy without gangrene: Secondary | ICD-10-CM | POA: Diagnosis not present

## 2019-11-27 DIAGNOSIS — M479 Spondylosis, unspecified: Secondary | ICD-10-CM | POA: Diagnosis not present

## 2019-11-27 DIAGNOSIS — M1712 Unilateral primary osteoarthritis, left knee: Secondary | ICD-10-CM | POA: Diagnosis not present

## 2019-11-27 DIAGNOSIS — M4800 Spinal stenosis, site unspecified: Secondary | ICD-10-CM | POA: Diagnosis not present

## 2019-11-27 DIAGNOSIS — E1043 Type 1 diabetes mellitus with diabetic autonomic (poly)neuropathy: Secondary | ICD-10-CM | POA: Diagnosis not present

## 2019-11-27 DIAGNOSIS — N184 Chronic kidney disease, stage 4 (severe): Secondary | ICD-10-CM | POA: Diagnosis not present

## 2019-11-28 DIAGNOSIS — N184 Chronic kidney disease, stage 4 (severe): Secondary | ICD-10-CM | POA: Diagnosis not present

## 2019-11-28 DIAGNOSIS — M479 Spondylosis, unspecified: Secondary | ICD-10-CM | POA: Diagnosis not present

## 2019-11-28 DIAGNOSIS — E1043 Type 1 diabetes mellitus with diabetic autonomic (poly)neuropathy: Secondary | ICD-10-CM | POA: Diagnosis not present

## 2019-11-28 DIAGNOSIS — E1022 Type 1 diabetes mellitus with diabetic chronic kidney disease: Secondary | ICD-10-CM | POA: Diagnosis not present

## 2019-11-28 DIAGNOSIS — M4800 Spinal stenosis, site unspecified: Secondary | ICD-10-CM | POA: Diagnosis not present

## 2019-11-28 DIAGNOSIS — D631 Anemia in chronic kidney disease: Secondary | ICD-10-CM | POA: Diagnosis not present

## 2019-11-28 DIAGNOSIS — I150 Renovascular hypertension: Secondary | ICD-10-CM | POA: Diagnosis not present

## 2019-11-28 DIAGNOSIS — M1712 Unilateral primary osteoarthritis, left knee: Secondary | ICD-10-CM | POA: Diagnosis not present

## 2019-11-28 DIAGNOSIS — E1051 Type 1 diabetes mellitus with diabetic peripheral angiopathy without gangrene: Secondary | ICD-10-CM | POA: Diagnosis not present

## 2019-11-29 DIAGNOSIS — M4800 Spinal stenosis, site unspecified: Secondary | ICD-10-CM | POA: Diagnosis not present

## 2019-11-29 DIAGNOSIS — N184 Chronic kidney disease, stage 4 (severe): Secondary | ICD-10-CM | POA: Diagnosis not present

## 2019-11-29 DIAGNOSIS — I150 Renovascular hypertension: Secondary | ICD-10-CM | POA: Diagnosis not present

## 2019-11-29 DIAGNOSIS — E1022 Type 1 diabetes mellitus with diabetic chronic kidney disease: Secondary | ICD-10-CM | POA: Diagnosis not present

## 2019-11-29 DIAGNOSIS — E1051 Type 1 diabetes mellitus with diabetic peripheral angiopathy without gangrene: Secondary | ICD-10-CM | POA: Diagnosis not present

## 2019-11-29 DIAGNOSIS — M479 Spondylosis, unspecified: Secondary | ICD-10-CM | POA: Diagnosis not present

## 2019-11-29 DIAGNOSIS — D631 Anemia in chronic kidney disease: Secondary | ICD-10-CM | POA: Diagnosis not present

## 2019-11-29 DIAGNOSIS — E1043 Type 1 diabetes mellitus with diabetic autonomic (poly)neuropathy: Secondary | ICD-10-CM | POA: Diagnosis not present

## 2019-11-29 DIAGNOSIS — M1712 Unilateral primary osteoarthritis, left knee: Secondary | ICD-10-CM | POA: Diagnosis not present

## 2019-12-01 DIAGNOSIS — M4800 Spinal stenosis, site unspecified: Secondary | ICD-10-CM | POA: Diagnosis not present

## 2019-12-01 DIAGNOSIS — E1051 Type 1 diabetes mellitus with diabetic peripheral angiopathy without gangrene: Secondary | ICD-10-CM | POA: Diagnosis not present

## 2019-12-01 DIAGNOSIS — Z7982 Long term (current) use of aspirin: Secondary | ICD-10-CM | POA: Diagnosis not present

## 2019-12-01 DIAGNOSIS — M1712 Unilateral primary osteoarthritis, left knee: Secondary | ICD-10-CM | POA: Diagnosis not present

## 2019-12-01 DIAGNOSIS — E1022 Type 1 diabetes mellitus with diabetic chronic kidney disease: Secondary | ICD-10-CM | POA: Diagnosis not present

## 2019-12-01 DIAGNOSIS — G40209 Localization-related (focal) (partial) symptomatic epilepsy and epileptic syndromes with complex partial seizures, not intractable, without status epilepticus: Secondary | ICD-10-CM | POA: Diagnosis not present

## 2019-12-01 DIAGNOSIS — E039 Hypothyroidism, unspecified: Secondary | ICD-10-CM | POA: Diagnosis not present

## 2019-12-01 DIAGNOSIS — E1043 Type 1 diabetes mellitus with diabetic autonomic (poly)neuropathy: Secondary | ICD-10-CM | POA: Diagnosis not present

## 2019-12-01 DIAGNOSIS — D631 Anemia in chronic kidney disease: Secondary | ICD-10-CM | POA: Diagnosis not present

## 2019-12-01 DIAGNOSIS — Z9181 History of falling: Secondary | ICD-10-CM | POA: Diagnosis not present

## 2019-12-01 DIAGNOSIS — E782 Mixed hyperlipidemia: Secondary | ICD-10-CM | POA: Diagnosis not present

## 2019-12-01 DIAGNOSIS — M479 Spondylosis, unspecified: Secondary | ICD-10-CM | POA: Diagnosis not present

## 2019-12-01 DIAGNOSIS — I150 Renovascular hypertension: Secondary | ICD-10-CM | POA: Diagnosis not present

## 2019-12-01 DIAGNOSIS — I872 Venous insufficiency (chronic) (peripheral): Secondary | ICD-10-CM | POA: Diagnosis not present

## 2019-12-01 DIAGNOSIS — Z8673 Personal history of transient ischemic attack (TIA), and cerebral infarction without residual deficits: Secondary | ICD-10-CM | POA: Diagnosis not present

## 2019-12-01 DIAGNOSIS — N184 Chronic kidney disease, stage 4 (severe): Secondary | ICD-10-CM | POA: Diagnosis not present

## 2019-12-01 DIAGNOSIS — Z7902 Long term (current) use of antithrombotics/antiplatelets: Secondary | ICD-10-CM | POA: Diagnosis not present

## 2019-12-04 ENCOUNTER — Telehealth: Payer: Self-pay

## 2019-12-04 NOTE — Telephone Encounter (Signed)
FYI Letting us know that patient's spouse had requested that patient only be seen one time this week and that other visit be moved to the end of the plan of care.

## 2019-12-05 NOTE — Telephone Encounter (Signed)
noted 

## 2019-12-06 ENCOUNTER — Telehealth: Payer: Self-pay

## 2019-12-06 DIAGNOSIS — M4800 Spinal stenosis, site unspecified: Secondary | ICD-10-CM | POA: Diagnosis not present

## 2019-12-06 DIAGNOSIS — M479 Spondylosis, unspecified: Secondary | ICD-10-CM | POA: Diagnosis not present

## 2019-12-06 DIAGNOSIS — E1022 Type 1 diabetes mellitus with diabetic chronic kidney disease: Secondary | ICD-10-CM | POA: Diagnosis not present

## 2019-12-06 DIAGNOSIS — N184 Chronic kidney disease, stage 4 (severe): Secondary | ICD-10-CM | POA: Diagnosis not present

## 2019-12-06 DIAGNOSIS — I150 Renovascular hypertension: Secondary | ICD-10-CM | POA: Diagnosis not present

## 2019-12-06 DIAGNOSIS — M1712 Unilateral primary osteoarthritis, left knee: Secondary | ICD-10-CM | POA: Diagnosis not present

## 2019-12-06 NOTE — Telephone Encounter (Signed)
Spoke with [facility] and explained to her that whoever originally prescribed the medication is who has to refill the medication and that is who she can ask for a 90 day supply from. She verbalized understanding. She stated all of her medications from [place] are good and she doesn't need any refills from [place] at the moment.

## 2019-12-06 NOTE — Telephone Encounter (Signed)
Please take a look at the PT medication , Chase [name] states that [employer] is calling and states they cant get a response from Dr Moshe Cipro

## 2019-12-11 DIAGNOSIS — M479 Spondylosis, unspecified: Secondary | ICD-10-CM | POA: Diagnosis not present

## 2019-12-11 DIAGNOSIS — I150 Renovascular hypertension: Secondary | ICD-10-CM | POA: Diagnosis not present

## 2019-12-11 DIAGNOSIS — N184 Chronic kidney disease, stage 4 (severe): Secondary | ICD-10-CM | POA: Diagnosis not present

## 2019-12-11 DIAGNOSIS — M4800 Spinal stenosis, site unspecified: Secondary | ICD-10-CM | POA: Diagnosis not present

## 2019-12-11 DIAGNOSIS — M1712 Unilateral primary osteoarthritis, left knee: Secondary | ICD-10-CM | POA: Diagnosis not present

## 2019-12-11 DIAGNOSIS — E1022 Type 1 diabetes mellitus with diabetic chronic kidney disease: Secondary | ICD-10-CM | POA: Diagnosis not present

## 2019-12-12 DIAGNOSIS — M479 Spondylosis, unspecified: Secondary | ICD-10-CM | POA: Diagnosis not present

## 2019-12-12 DIAGNOSIS — M4800 Spinal stenosis, site unspecified: Secondary | ICD-10-CM | POA: Diagnosis not present

## 2019-12-12 DIAGNOSIS — M1712 Unilateral primary osteoarthritis, left knee: Secondary | ICD-10-CM | POA: Diagnosis not present

## 2019-12-12 DIAGNOSIS — I150 Renovascular hypertension: Secondary | ICD-10-CM | POA: Diagnosis not present

## 2019-12-12 DIAGNOSIS — E1022 Type 1 diabetes mellitus with diabetic chronic kidney disease: Secondary | ICD-10-CM | POA: Diagnosis not present

## 2019-12-12 DIAGNOSIS — N184 Chronic kidney disease, stage 4 (severe): Secondary | ICD-10-CM | POA: Diagnosis not present

## 2019-12-13 DIAGNOSIS — M479 Spondylosis, unspecified: Secondary | ICD-10-CM | POA: Diagnosis not present

## 2019-12-13 DIAGNOSIS — M4800 Spinal stenosis, site unspecified: Secondary | ICD-10-CM | POA: Diagnosis not present

## 2019-12-13 DIAGNOSIS — M1712 Unilateral primary osteoarthritis, left knee: Secondary | ICD-10-CM | POA: Diagnosis not present

## 2019-12-13 DIAGNOSIS — E1022 Type 1 diabetes mellitus with diabetic chronic kidney disease: Secondary | ICD-10-CM | POA: Diagnosis not present

## 2019-12-13 DIAGNOSIS — I150 Renovascular hypertension: Secondary | ICD-10-CM | POA: Diagnosis not present

## 2019-12-13 DIAGNOSIS — N184 Chronic kidney disease, stage 4 (severe): Secondary | ICD-10-CM | POA: Diagnosis not present

## 2019-12-14 DIAGNOSIS — M479 Spondylosis, unspecified: Secondary | ICD-10-CM | POA: Diagnosis not present

## 2019-12-14 DIAGNOSIS — M4800 Spinal stenosis, site unspecified: Secondary | ICD-10-CM | POA: Diagnosis not present

## 2019-12-14 DIAGNOSIS — E1022 Type 1 diabetes mellitus with diabetic chronic kidney disease: Secondary | ICD-10-CM | POA: Diagnosis not present

## 2019-12-14 DIAGNOSIS — I150 Renovascular hypertension: Secondary | ICD-10-CM | POA: Diagnosis not present

## 2019-12-14 DIAGNOSIS — N184 Chronic kidney disease, stage 4 (severe): Secondary | ICD-10-CM | POA: Diagnosis not present

## 2019-12-14 DIAGNOSIS — M1712 Unilateral primary osteoarthritis, left knee: Secondary | ICD-10-CM | POA: Diagnosis not present

## 2019-12-18 DIAGNOSIS — N184 Chronic kidney disease, stage 4 (severe): Secondary | ICD-10-CM | POA: Diagnosis not present

## 2019-12-18 DIAGNOSIS — E1022 Type 1 diabetes mellitus with diabetic chronic kidney disease: Secondary | ICD-10-CM | POA: Diagnosis not present

## 2019-12-18 DIAGNOSIS — M4800 Spinal stenosis, site unspecified: Secondary | ICD-10-CM | POA: Diagnosis not present

## 2019-12-18 DIAGNOSIS — M1712 Unilateral primary osteoarthritis, left knee: Secondary | ICD-10-CM | POA: Diagnosis not present

## 2019-12-18 DIAGNOSIS — M479 Spondylosis, unspecified: Secondary | ICD-10-CM | POA: Diagnosis not present

## 2019-12-18 DIAGNOSIS — I150 Renovascular hypertension: Secondary | ICD-10-CM | POA: Diagnosis not present

## 2019-12-20 DIAGNOSIS — M479 Spondylosis, unspecified: Secondary | ICD-10-CM | POA: Diagnosis not present

## 2019-12-20 DIAGNOSIS — N184 Chronic kidney disease, stage 4 (severe): Secondary | ICD-10-CM | POA: Diagnosis not present

## 2019-12-20 DIAGNOSIS — I150 Renovascular hypertension: Secondary | ICD-10-CM | POA: Diagnosis not present

## 2019-12-20 DIAGNOSIS — M1712 Unilateral primary osteoarthritis, left knee: Secondary | ICD-10-CM | POA: Diagnosis not present

## 2019-12-20 DIAGNOSIS — E1022 Type 1 diabetes mellitus with diabetic chronic kidney disease: Secondary | ICD-10-CM | POA: Diagnosis not present

## 2019-12-20 DIAGNOSIS — M4800 Spinal stenosis, site unspecified: Secondary | ICD-10-CM | POA: Diagnosis not present

## 2020-01-01 DIAGNOSIS — Z23 Encounter for immunization: Secondary | ICD-10-CM | POA: Diagnosis not present

## 2020-01-03 ENCOUNTER — Ambulatory Visit: Payer: Medicare Other | Admitting: Family Medicine

## 2020-01-31 ENCOUNTER — Telehealth: Payer: Self-pay | Admitting: "Endocrinology

## 2020-01-31 DIAGNOSIS — IMO0002 Reserved for concepts with insufficient information to code with codable children: Secondary | ICD-10-CM

## 2020-01-31 DIAGNOSIS — E038 Other specified hypothyroidism: Secondary | ICD-10-CM

## 2020-01-31 DIAGNOSIS — E1122 Type 2 diabetes mellitus with diabetic chronic kidney disease: Secondary | ICD-10-CM

## 2020-01-31 NOTE — Telephone Encounter (Signed)
CAN YOU UPDATE LAB ORDER FOR [facility]

## 2020-01-31 NOTE — Telephone Encounter (Signed)
New order placed

## 2020-02-01 ENCOUNTER — Ambulatory Visit: Payer: Medicare Other | Admitting: "Endocrinology

## 2020-02-01 ENCOUNTER — Other Ambulatory Visit: Payer: Self-pay | Admitting: Family Medicine

## 2020-02-01 DIAGNOSIS — Z23 Encounter for immunization: Secondary | ICD-10-CM | POA: Diagnosis not present

## 2020-02-07 DIAGNOSIS — E1165 Type 2 diabetes mellitus with hyperglycemia: Secondary | ICD-10-CM | POA: Diagnosis not present

## 2020-02-07 DIAGNOSIS — Z794 Long term (current) use of insulin: Secondary | ICD-10-CM | POA: Diagnosis not present

## 2020-02-07 DIAGNOSIS — E1122 Type 2 diabetes mellitus with diabetic chronic kidney disease: Secondary | ICD-10-CM | POA: Diagnosis not present

## 2020-02-07 DIAGNOSIS — N184 Chronic kidney disease, stage 4 (severe): Secondary | ICD-10-CM | POA: Diagnosis not present

## 2020-02-07 DIAGNOSIS — E038 Other specified hypothyroidism: Secondary | ICD-10-CM | POA: Diagnosis not present

## 2020-02-08 LAB — COMPREHENSIVE METABOLIC PANEL
AG Ratio: 1.3 (calc) (ref 1.0–2.5)
ALT: 11 U/L (ref 9–46)
AST: 20 U/L (ref 10–35)
Albumin: 3.8 g/dL (ref 3.6–5.1)
Alkaline phosphatase (APISO): 71 U/L (ref 35–144)
BUN/Creatinine Ratio: 13 (calc) (ref 6–22)
BUN: 40 mg/dL — ABNORMAL HIGH (ref 7–25)
CO2: 25 mmol/L (ref 20–32)
Calcium: 9.6 mg/dL (ref 8.6–10.3)
Chloride: 106 mmol/L (ref 98–110)
Creat: 3.13 mg/dL — ABNORMAL HIGH (ref 0.70–1.11)
Globulin: 2.9 g/dL (calc) (ref 1.9–3.7)
Glucose, Bld: 178 mg/dL — ABNORMAL HIGH (ref 65–99)
Potassium: 5.2 mmol/L (ref 3.5–5.3)
Sodium: 139 mmol/L (ref 135–146)
Total Bilirubin: 0.4 mg/dL (ref 0.2–1.2)
Total Protein: 6.7 g/dL (ref 6.1–8.1)

## 2020-02-08 LAB — HEMOGLOBIN A1C
Hgb A1c MFr Bld: 5.9 % of total Hgb — ABNORMAL HIGH (ref <5.7)
Mean Plasma Glucose: 123 (calc)
eAG (mmol/L): 6.8 (calc)

## 2020-02-08 LAB — T4, FREE: Free T4: 1.1 ng/dL (ref 0.8–1.8)

## 2020-02-08 LAB — TSH: TSH: 4.52 mIU/L — ABNORMAL HIGH (ref 0.40–4.50)

## 2020-02-20 ENCOUNTER — Ambulatory Visit: Payer: Medicare Other | Admitting: Family Medicine

## 2020-02-24 ENCOUNTER — Other Ambulatory Visit: Payer: Self-pay | Admitting: Family Medicine

## 2020-02-27 ENCOUNTER — Encounter: Payer: Self-pay | Admitting: "Endocrinology

## 2020-02-27 ENCOUNTER — Other Ambulatory Visit: Payer: Self-pay

## 2020-02-27 ENCOUNTER — Ambulatory Visit (INDEPENDENT_AMBULATORY_CARE_PROVIDER_SITE_OTHER): Payer: Medicare Other | Admitting: "Endocrinology

## 2020-02-27 VITALS — BP 142/66 | HR 64 | Ht 68.0 in | Wt 168.0 lb

## 2020-02-27 DIAGNOSIS — I10 Essential (primary) hypertension: Secondary | ICD-10-CM | POA: Diagnosis not present

## 2020-02-27 DIAGNOSIS — E782 Mixed hyperlipidemia: Secondary | ICD-10-CM

## 2020-02-27 DIAGNOSIS — E1122 Type 2 diabetes mellitus with diabetic chronic kidney disease: Secondary | ICD-10-CM

## 2020-02-27 DIAGNOSIS — E1165 Type 2 diabetes mellitus with hyperglycemia: Secondary | ICD-10-CM | POA: Diagnosis not present

## 2020-02-27 DIAGNOSIS — E038 Other specified hypothyroidism: Secondary | ICD-10-CM

## 2020-02-27 DIAGNOSIS — IMO0002 Reserved for concepts with insufficient information to code with codable children: Secondary | ICD-10-CM

## 2020-02-27 DIAGNOSIS — N184 Chronic kidney disease, stage 4 (severe): Secondary | ICD-10-CM

## 2020-02-27 DIAGNOSIS — Z794 Long term (current) use of insulin: Secondary | ICD-10-CM

## 2020-02-27 NOTE — Progress Notes (Signed)
02/27/2020           Endocrinology follow-up note    Subjective:    Patient ID: Chase [name], male    DOB: 07-27-1931, PCP [name], MD   Past Medical History:  Diagnosis Date  . Bradycardia 03/15/2012  . Carotid artery occlusion   . Choledocholithiasis 02/25/2018  . CKD (chronic kidney disease) stage 4, GFR 15-29 ml/min (HCC)   . Complete lesion of cervical spinal cord ([place]) 03/14/3012   Stable since 2006  . CVA (cerebrovascular accident) ([place]) 09/07/12   right sided weakness  . Depressive disorder, not elsewhere classified   . Diabetes mellitus approx 1994  . Diabetic neuropathy ([place])   . History of kidney stones   . Hypertensive heart disease   . Hypothyroidism approx 2000  . Lacunar stroke, acute ([place]) 03/14/2012  . Obesity   . Osteoarthrosis, unspecified whether generalized or localized, lower leg   . Other and unspecified hyperlipidemia   . Peripheral vascular disease, unspecified ([place])   . Seizures ([place])    unknown etiology; on meds, last seizure was 2015  . Spinal stenosis, unspecified region other than cervical   . Spondylosis of unspecified site without mention of myelopathy    Past Surgical History:  Procedure Laterality Date  . BILIARY STENT PLACEMENT N/A 05/27/2018   Procedure: BILIARY STENT PLACEMENT;  Surgeon: Rogene Houston, MD;  [facility];  Service: Endoscopy;  Laterality: N/A;  . COLONOSCOPY N/A 08/10/2013   Procedure: COLONOSCOPY;  Surgeon: Rogene Houston, MD;  [facility];  Service: Endoscopy;  Laterality: N/A;  240  . ERCP N/A 03/02/2018   Procedure: ENDOSCOPIC RETROGRADE CHOLANGIOPANCREATOGRAPHY (ERCP) With sphincterotomy and stent placement;  Surgeon: Rogene Houston, MD;  [facility];  Service: Gastroenterology;  Laterality: N/A;  . ERCP N/A 05/27/2018   Procedure: ENDOSCOPIC RETROGRADE CHOLANGIOPANCREATOGRAPHY (ERCP);  Surgeon: Rogene Houston, MD;  [facility];  Service: Endoscopy;   Laterality: N/A;  . ERCP N/A 09/16/2018   Procedure: ENDOSCOPIC RETROGRADE CHOLANGIOPANCREATOGRAPHY (ERCP);  Surgeon: Rogene Houston, MD;  [facility];  Service: Endoscopy;  Laterality: N/A;  . GASTROINTESTINAL STENT REMOVAL N/A 05/27/2018   Procedure: Biliary STENT REMOVAL;  Surgeon: Rogene Houston, MD;  [facility];  Service: Endoscopy;  Laterality: N/A;  . GASTROINTESTINAL STENT REMOVAL N/A 09/16/2018   Procedure: GASTROINTESTINAL STENT REMOVAL;  Surgeon: Rogene Houston, MD;  [facility];  Service: Endoscopy;  Laterality: N/A;  . kidney stones left x2  1975  . KIDNEY SURGERY     Ruptured left kidney 30 yrs ago  from a kidney stone  . LITHOTRIPSY N/A 05/27/2018   Procedure: MECHANICAL LITHOTRIPSY;  Surgeon: Rogene Houston, MD;  [facility];  Service: Endoscopy;  Laterality: N/A;  . REMOVAL OF STONES N/A 09/16/2018   Procedure: REMOVAL OF MULTIPLE STONES WITH BASKET AND BALLOON;  Surgeon: Rogene Houston, MD;  [facility];  Service: Endoscopy;  Laterality: N/A;  . SPHINCTEROTOMY N/A 05/27/2018   Procedure: SPHINCTEROTOMY extended;  Surgeon: Rogene Houston, MD;  [facility];  Service: Endoscopy;  Laterality: N/A;  . SPYGLASS CHOLANGIOSCOPY N/A 05/27/2018   Procedure: AQTMAUQJ CHOLANGIOSCOPY;  Surgeon: Rogene Houston, MD;  [facility];  Service: Endoscopy;  Laterality: N/A;   Social History   Socioeconomic History  . Marital status: Married    Spouse name: Not on file  . Number  of children: 5  . Years of education: 8  . Highest education level: 8th grade  Occupational History  . Occupation: retired     Fish farm manager: RETIRED  Tobacco Use  . Smoking status: Never Smoker  . Smokeless tobacco: Never Used  Substance and Sexual Activity  . Alcohol use: No    Alcohol/week: 0.0 standard drinks  . Drug use: No  . Sexual activity: Not Currently  Other Topics Concern  . Not on file  Social History  Narrative  . Not on file   Social Determinants of Health   Financial Resource Strain:   . Difficulty of Paying Living Expenses:   Food Insecurity:   . Worried About Charity fundraiser in the Last Year:   . Arboriculturist in the Last Year:   Transportation Needs:   . Film/video editor (Medical):   Marland Kitchen Lack of Transportation (Non-Medical):   Physical Activity:   . Days of Exercise per Week:   . Minutes of Exercise per Session:   Stress:   . Feeling of Stress :   Social Connections:   . Frequency of Communication with Friends and Family:   . Frequency of Social Gatherings with Friends and Family:   . Attends Religious Services:   . Active Member of Clubs or Organizations:   . Attends Archivist Meetings:   Marland Kitchen Marital Status:    Outpatient Encounter Medications as of 02/27/2020  Medication Sig  . amLODipine (NORVASC) 10 MG tablet TAKE 1 TABLET DAILY  . aspirin EC 81 MG tablet Take 1 tablet (81 mg total) by mouth daily.  . calcium carbonate (TUMS - DOSED IN MG ELEMENTAL CALCIUM) 500 MG chewable tablet Chew 1 tablet by mouth 2 (two) times daily. For Bone Health  . clobetasol cream (TEMOVATE) 0.93 % Apply 1 application topically as directed.  . clopidogrel (PLAVIX) 75 MG tablet TAKE 1 TABLET DAILY  . desonide (DESOWEN) 0.05 % cream Apply 1 application topically as directed.  . diphenoxylate-atropine (LOMOTIL) 2.5-0.025 MG tablet Take one tablet one every other day, as needed, for loose stool  . glucose blood test strip 1 each by Other route 2 (two) times daily. Use as instructed bid E11.65  . Insulin Isophane & Regular Human (NOVOLIN 70/30 FLEXPEN RELION) (70-30) 100 UNIT/ML PEN INJECT 10 UNITS SUBCUTANEOUSLY TWICE DAILY BEFORE  A  MEAL. May fill Humulin 70/30 flexpen  . Insulin Pen Needle (B-D ULTRAFINE III SHORT PEN) 31G X 8 MM MISC 1 each by Does not apply route as directed.  . levETIRAcetam (KEPPRA) 500 MG tablet Take 250 mg by mouth at bedtime.   Marland Kitchen levothyroxine  (SYNTHROID) 112 MCG tablet Take 1 tablet (112 mcg total) by mouth daily before breakfast.  . Multiple Vitamins-Minerals (MULTIVITAMIN WITH MINERALS) tablet Take 1 tablet by mouth daily.  Marland Kitchen olopatadine (PATANOL) 0.1 % ophthalmic solution INSTILL 1 DROP INTO EACH EYE TWICE DAILY  . OneTouch Delica Lancets 26Z MISC 1 each by Other route 2 (two) times daily.  . pravastatin (PRAVACHOL) 20 MG tablet TAKE 1 TABLET DAILY  . tamsulosin (FLOMAX) 0.4 MG CAPS capsule Take 0.4 mg by mouth daily.  . Vitamin D, Ergocalciferol, (DRISDOL) 1.25 MG (50000 UNIT) CAPS capsule TAKE 1 CAPSULE ONCE A WEEK   No facility-administered encounter medications on file as of 02/27/2020.   ALLERGIES: Allergies  Allergen Reactions  . Bayer Advanced Aspirin [Aspirin] Nausea And Vomiting  . Penicillins Nausea And Vomiting    Has patient had a PCN  reaction causing immediate rash, facial/tongue/throat swelling, SOB or lightheadedness with hypotension: unknown Has patient had a PCN reaction causing severe rash involving mucus membranes or skin necrosis: unknown Has patient had a PCN reaction that required hospitalization: unknown Has patient had a PCN reaction occurring within the last 10 years: no If all of the above answers are "NO", then may proceed with Cephalosporin use.   VACCINATION STATUS: Immunization History  Administered Date(s) Administered  . Fluad Quad(high Dose 65+) 08/07/2019  . H1N1 11/14/2008  . Influenza Split 10/07/2011, 09/08/2012  . Influenza Whole 08/22/2007, 08/27/2010  . Influenza,inj,Quad PF,6+ Mos 08/22/2013, 09/26/2014, 09/26/2015, 09/01/2016, 09/20/2017, 09/26/2018  . Pneumococcal Conjugate-13 06/12/2014  . Pneumococcal Polysaccharide-23 05/21/2004  . Td 05/21/2004  . Tdap 10/07/2011    Diabetes He presents for his follow-up diabetic visit. He has type 2 diabetes mellitus. Onset time: He was diagnosed at approximate age of 63 years. His disease course has been improving. There are no  hypoglycemic associated symptoms. Pertinent negatives for hypoglycemia include no confusion, headaches, pallor or seizures. There are no diabetic associated symptoms. Pertinent negatives for diabetes include no chest pain, no fatigue, no polydipsia, no polyphagia, no polyuria and no weakness. There are no hypoglycemic complications. Symptoms are improving. Diabetic complications include nephropathy and PVD. Risk factors for coronary artery disease include diabetes mellitus, dyslipidemia, male sex, obesity and sedentary lifestyle. Current diabetic treatment includes insulin injections. He is compliant with treatment most of the time. His weight is stable. He is following a diabetic diet. When asked about meal planning, he reported none. He never participates in exercise. His home blood glucose trend is fluctuating minimally. His breakfast blood glucose range is generally 130-140 mg/dl. His dinner blood glucose range is generally 180-200 mg/dl. His overall blood glucose range is 180-200 mg/dl. (He presents with continued improvement in his glycemic profile, on target fasting and slightly above target postprandial glycemic profile.  His recent A1c is 5.9%.  )  Hyperlipidemia This is a chronic problem. The current episode started more than 1 year ago. Exacerbating diseases include diabetes and hypothyroidism. Pertinent negatives include no chest pain, myalgias or shortness of breath. Current antihyperlipidemic treatment includes statins. Risk factors for coronary artery disease include dyslipidemia, diabetes mellitus, hypertension, male sex and a sedentary lifestyle.  Hypertension This is a chronic problem. The current episode started more than 1 year ago. Pertinent negatives include no chest pain, headaches, neck pain, palpitations or shortness of breath. Risk factors for coronary artery disease include diabetes mellitus, dyslipidemia and sedentary lifestyle. Past treatments include direct vasodilators.  Hypertensive end-organ damage includes PVD. Identifiable causes of hypertension include a thyroid problem.  Thyroid Problem Presents for follow-up visit. Patient reports no constipation, diarrhea, fatigue or palpitations. The symptoms have been stable. Past treatments include levothyroxine. His past medical history is significant for diabetes and hyperlipidemia.   Review of systems: Limited as above.   Objective:    BP (!) 142/66   Pulse 64   Ht 5\' 8"  (1.727 m)   Wt 168 lb (76.2 kg)   BMI 25.54 kg/m   Wt Readings from Last 3 Encounters:  02/27/20 168 lb (76.2 kg)  10/17/19 168 lb (76.2 kg)  09/28/19 168 lb (76.2 kg)     Physical Exam- Limited  Constitutional:  Body mass index is 25.54 kg/m. , not in acute distress, + walks with a walker.   Eyes:  EOMI, no exophthalmos Neck: Supple Thyroid: No gross goiter Respiratory: Adequate breathing efforts Musculoskeletal: + Walks with a walker, bilateral  peripheral dependent edema.    Skin:  no rashes, no hyperemia Neurological: no tremor with outstretched hands,   CMP     Component Value Date/Time   NA 139 02/07/2020 1102   K 5.2 02/07/2020 1102   CL 106 02/07/2020 1102   CO2 25 02/07/2020 1102   GLUCOSE 178 (H) 02/07/2020 1102   BUN 40 (H) 02/07/2020 1102   CREATININE 3.13 (H) 02/07/2020 1102   CALCIUM 9.6 02/07/2020 1102   PROT 6.7 02/07/2020 1102   ALBUMIN 3.7 05/27/2018 1016   AST 20 02/07/2020 1102   ALT 11 02/07/2020 1102   ALKPHOS 118 05/27/2018 1016   BILITOT 0.4 02/07/2020 1102   GFRNONAA 14 (L) 10/17/2019 1359   GFRAA 16 (L) 10/17/2019 1359   Diabetic Labs (most recent): Lab Results  Component Value Date   HGBA1C 5.9 (H) 02/07/2020   HGBA1C 5.9 (H) 07/03/2019   HGBA1C 7.5 (H) 06/02/2018    Lipid Panel     Component Value Date/Time   CHOL 180 07/03/2019 1509   TRIG 85 07/03/2019 1509   HDL 68 07/03/2019 1509   CHOLHDL 2.6 07/03/2019 1509   VLDL 8 03/12/2016 1139   LDLCALC 94 07/03/2019 1509      Assessment & Plan:   1.  type 2 diabetes mellitus with stage 4 chronic kidney disease, with long-term current use of insulin ([place]) -his diabetes is  complicated by chronic kidney disease stage IV and patient remains at a high risk for more acute and chronic complications of diabetes which include CAD, CVA, CKD, retinopathy, and neuropathy. These are all discussed in detail with the patient.  -His wife is assisting on 100% of his care. He presents with continued improvement in his glycemic profile, on target fasting and slightly above target postprandial glycemic profile.  His recent A1c is 5.9%.  No reported or documented hypoglycemia.   - I have re-counseled the patient on diet management   by adopting a carbohydrate restricted / protein rich  Diet. -He is advised to avoid excessively processed carbs including soda, ice cream, and sweet tea.   - Patient is advised to stick to a routine mealtimes to eat 3 meals  a day and avoid unnecessary snacks ( to snack only to correct hypoglycemia).  - I have approached patient with the following individualized plan to manage diabetes and patient agrees.  -I have advised patient and his wife to continue Novolin 70/30  10 units twice a day with breakfast and supper when pre-meal blood glucose is above 150 mg/dL, associated with monitoring of blood glucose at least 2 times a day- before breakfast and before supper.     - Patient is warned not to take insulin without proper monitoring per orders. -Adjustment parameters are given for hypo and hyperglycemia in writing. -They are encouraged to call clinic  for blood glucose levels less than 70 or above 300 mg /dl. -Patient is not a candidate for metformin,SGLT2 inhibitors due to CKD.  2) BP/HTN: His blood pressure is slightly above target.  He is not on antihypertensive treatment at this time.   3) Lipids/HPL: His lipid panel has been stable for 2 years.  He remains on pravastatin 20 mg p.o.  nightly.   4) hypothyroidism:   -I advised him to continue levothyroxine 112 mcg p.o. daily before breakfast.     - We discussed about the correct intake of his thyroid hormone, on empty stomach at fasting, with water, separated by at least 30 minutes from breakfast  and other medications,  and separated by more than 4 hours from calcium, iron, multivitamins, acid reflux medications (PPIs). -Patient is made aware of the fact that thyroid hormone replacement is needed for life, dose to be adjusted by periodic monitoring of thyroid function tests.  - I advised patient to maintain close follow up with [name], MD for primary care needs.  - Time spent on this patient care encounter:  35 min, of which > 50% was spent in  counseling and the rest reviewing his blood glucose logs , discussing his hypoglycemia and hyperglycemia episodes, reviewing his current and  previous labs / studies  ( including abstraction from other facilities) and medications  doses and developing a  long term treatment plan and documenting his care.   Please refer to Patient Instructions for Blood Glucose Monitoring and Insulin/Medications Dosing Guide"  in media tab for additional information. Please  also refer to " Patient Self Inventory" in the Media  tab for reviewed elements of pertinent patient history.  [name] participated in the discussions, expressed understanding, and voiced agreement with the above plans.  All questions were answered to his satisfaction. he is encouraged to contact clinic should he have any questions or concerns prior to his return visit.   Follow up plan: -Return in about 4 months (around 06/28/2020) for Bring Meter and Logs- A1c in Office.  Glade Lloyd, MD Phone: [phone]  Fax: [phone]  This note was partially dictated with voice recognition software. Similar sounding words can be transcribed inadequately or may not  be corrected upon review.  02/27/2020, 12:20 PM

## 2020-04-05 ENCOUNTER — Other Ambulatory Visit: Payer: Self-pay | Admitting: "Endocrinology

## 2020-04-05 DIAGNOSIS — B351 Tinea unguium: Secondary | ICD-10-CM | POA: Diagnosis not present

## 2020-04-05 DIAGNOSIS — E038 Other specified hypothyroidism: Secondary | ICD-10-CM

## 2020-04-05 DIAGNOSIS — E1142 Type 2 diabetes mellitus with diabetic polyneuropathy: Secondary | ICD-10-CM | POA: Diagnosis not present

## 2020-04-05 DIAGNOSIS — L851 Acquired keratosis [keratoderma] palmaris et plantaris: Secondary | ICD-10-CM | POA: Diagnosis not present

## 2020-06-03 ENCOUNTER — Telehealth: Payer: Self-pay

## 2020-06-03 NOTE — Telephone Encounter (Signed)
Pls provide requested letter I will sign, thanks. His medical conditions support this

## 2020-06-03 NOTE — Telephone Encounter (Signed)
Patients wife called requesting a letter from Dr Moshe Cipro that due to his medical condition his mail box needs to be moved.  Please call wife is you have any questions.

## 2020-06-04 NOTE — Telephone Encounter (Signed)
Letter typed and waiting for signature.

## 2020-06-21 ENCOUNTER — Telehealth: Payer: Self-pay | Admitting: Family Medicine

## 2020-06-21 NOTE — Telephone Encounter (Signed)
I recommend she start writing down if certain foods make him sick, more likely something he is eating than on medication she has had for years

## 2020-06-21 NOTE — Telephone Encounter (Signed)
Spoke with spouse and advised her of treatment plan with verbal understanding.

## 2020-06-21 NOTE — Telephone Encounter (Signed)
Wife stated the pt is having loose stools and wonder if its the medication or something that he is eating. She stated he will get medication every other day and it stops for a bit but the starts back up.

## 2020-06-28 ENCOUNTER — Ambulatory Visit: Payer: Medicare Other | Admitting: "Endocrinology

## 2020-06-28 ENCOUNTER — Other Ambulatory Visit: Payer: Self-pay

## 2020-06-28 ENCOUNTER — Encounter: Payer: Self-pay | Admitting: Nurse Practitioner

## 2020-06-28 ENCOUNTER — Ambulatory Visit (INDEPENDENT_AMBULATORY_CARE_PROVIDER_SITE_OTHER): Payer: Medicare Other | Admitting: Nurse Practitioner

## 2020-06-28 VITALS — BP 148/80 | HR 76 | Ht 68.0 in

## 2020-06-28 DIAGNOSIS — Z794 Long term (current) use of insulin: Secondary | ICD-10-CM

## 2020-06-28 DIAGNOSIS — E782 Mixed hyperlipidemia: Secondary | ICD-10-CM | POA: Diagnosis not present

## 2020-06-28 DIAGNOSIS — N184 Chronic kidney disease, stage 4 (severe): Secondary | ICD-10-CM | POA: Diagnosis not present

## 2020-06-28 DIAGNOSIS — I10 Essential (primary) hypertension: Secondary | ICD-10-CM

## 2020-06-28 DIAGNOSIS — E1122 Type 2 diabetes mellitus with diabetic chronic kidney disease: Secondary | ICD-10-CM

## 2020-06-28 DIAGNOSIS — E1165 Type 2 diabetes mellitus with hyperglycemia: Secondary | ICD-10-CM | POA: Diagnosis not present

## 2020-06-28 DIAGNOSIS — E038 Other specified hypothyroidism: Secondary | ICD-10-CM | POA: Diagnosis not present

## 2020-06-28 DIAGNOSIS — IMO0002 Reserved for concepts with insufficient information to code with codable children: Secondary | ICD-10-CM

## 2020-06-28 LAB — POCT GLYCOSYLATED HEMOGLOBIN (HGB A1C): Hemoglobin A1C: 5.8 % — AB (ref 4.0–5.6)

## 2020-06-28 NOTE — Progress Notes (Signed)
06/28/2020           Endocrinology follow-up note    Subjective:    Patient ID: Chase [name], male    DOB: Jan 13, 1931, PCP [name], MD   Past Medical History:  Diagnosis Date  . Bradycardia 03/15/2012  . Carotid artery occlusion   . Choledocholithiasis 02/25/2018  . CKD (chronic kidney disease) stage 4, GFR 15-29 ml/min (HCC)   . Complete lesion of cervical spinal cord ([place]) 03/14/3012   Stable since 2006  . CVA (cerebrovascular accident) ([place]) 09/07/12   right sided weakness  . Depressive disorder, not elsewhere classified   . Diabetes mellitus approx 1994  . Diabetic neuropathy ([place])   . History of kidney stones   . Hypertensive heart disease   . Hypothyroidism approx 2000  . Lacunar stroke, acute ([place]) 03/14/2012  . Obesity   . Osteoarthrosis, unspecified whether generalized or localized, lower leg   . Other and unspecified hyperlipidemia   . Peripheral vascular disease, unspecified ([place])   . Seizures ([place])    unknown etiology; on meds, last seizure was 2015  . Spinal stenosis, unspecified region other than cervical   . Spondylosis of unspecified site without mention of myelopathy    Past Surgical History:  Procedure Laterality Date  . BILIARY STENT PLACEMENT N/A 05/27/2018   Procedure: BILIARY STENT PLACEMENT;  Surgeon: Rogene Houston, MD;  [facility];  Service: Endoscopy;  Laterality: N/A;  . COLONOSCOPY N/A 08/10/2013   Procedure: COLONOSCOPY;  Surgeon: Rogene Houston, MD;  [facility];  Service: Endoscopy;  Laterality: N/A;  240  . ERCP N/A 03/02/2018   Procedure: ENDOSCOPIC RETROGRADE CHOLANGIOPANCREATOGRAPHY (ERCP) With sphincterotomy and stent placement;  Surgeon: Rogene Houston, MD;  [facility];  Service: Gastroenterology;  Laterality: N/A;  . ERCP N/A 05/27/2018   Procedure: ENDOSCOPIC RETROGRADE CHOLANGIOPANCREATOGRAPHY (ERCP);  Surgeon: Rogene Houston, MD;  [facility];  Service: Endoscopy;   Laterality: N/A;  . ERCP N/A 09/16/2018   Procedure: ENDOSCOPIC RETROGRADE CHOLANGIOPANCREATOGRAPHY (ERCP);  Surgeon: Rogene Houston, MD;  [facility];  Service: Endoscopy;  Laterality: N/A;  . GASTROINTESTINAL STENT REMOVAL N/A 05/27/2018   Procedure: Biliary STENT REMOVAL;  Surgeon: Rogene Houston, MD;  [facility];  Service: Endoscopy;  Laterality: N/A;  . GASTROINTESTINAL STENT REMOVAL N/A 09/16/2018   Procedure: GASTROINTESTINAL STENT REMOVAL;  Surgeon: Rogene Houston, MD;  [facility];  Service: Endoscopy;  Laterality: N/A;  . kidney stones left x2  1975  . KIDNEY SURGERY     Ruptured left kidney 30 yrs ago  from a kidney stone  . LITHOTRIPSY N/A 05/27/2018   Procedure: MECHANICAL LITHOTRIPSY;  Surgeon: Rogene Houston, MD;  [facility];  Service: Endoscopy;  Laterality: N/A;  . REMOVAL OF STONES N/A 09/16/2018   Procedure: REMOVAL OF MULTIPLE STONES WITH BASKET AND BALLOON;  Surgeon: Rogene Houston, MD;  [facility];  Service: Endoscopy;  Laterality: N/A;  . SPHINCTEROTOMY N/A 05/27/2018   Procedure: SPHINCTEROTOMY extended;  Surgeon: Rogene Houston, MD;  [facility];  Service: Endoscopy;  Laterality: N/A;  . SPYGLASS CHOLANGIOSCOPY N/A 05/27/2018   Procedure: HALPFXTK CHOLANGIOSCOPY;  Surgeon: Rogene Houston, MD;  [facility];  Service: Endoscopy;  Laterality: N/A;   Social History   Socioeconomic History  . Marital status: Married    Spouse name: Not on file  . Number  of children: 5  . Years of education: 8  . Highest education level: 8th grade  Occupational History  . Occupation: retired     Fish farm manager: RETIRED  Tobacco Use  . Smoking status: Never Smoker  . Smokeless tobacco: Never Used  Vaping Use  . Vaping Use: Never used  Substance and Sexual Activity  . Alcohol use: No    Alcohol/week: 0.0 standard drinks  . Drug use: No  . Sexual activity: Not Currently  Other Topics  Concern  . Not on file  Social History Narrative  . Not on file   Social Determinants of Health   Financial Resource Strain:   . Difficulty of Paying Living Expenses:   Food Insecurity:   . Worried About Charity fundraiser in the Last Year:   . Arboriculturist in the Last Year:   Transportation Needs:   . Film/video editor (Medical):   Marland Kitchen Lack of Transportation (Non-Medical):   Physical Activity:   . Days of Exercise per Week:   . Minutes of Exercise per Session:   Stress:   . Feeling of Stress :   Social Connections:   . Frequency of Communication with Friends and Family:   . Frequency of Social Gatherings with Friends and Family:   . Attends Religious Services:   . Active Member of Clubs or Organizations:   . Attends Archivist Meetings:   Marland Kitchen Marital Status:    Outpatient Encounter Medications as of 06/28/2020  Medication Sig  . Insulin Isophane & Regular Human (NOVOLIN 70/30 FLEXPEN RELION) (70-30) 100 UNIT/ML PEN INJECT 10 UNITS SUBCUTANEOUSLY TWICE DAILY BEFORE  A  MEAL. May fill Humulin 70/30 flexpen  . amLODipine (NORVASC) 10 MG tablet TAKE 1 TABLET DAILY  . aspirin EC 81 MG tablet Take 1 tablet (81 mg total) by mouth daily.  . calcium carbonate (TUMS - DOSED IN MG ELEMENTAL CALCIUM) 500 MG chewable tablet Chew 1 tablet by mouth 2 (two) times daily. For Bone Health  . clobetasol cream (TEMOVATE) 0.93 % Apply 1 application topically as directed.  . clopidogrel (PLAVIX) 75 MG tablet TAKE 1 TABLET DAILY  . desonide (DESOWEN) 0.05 % cream Apply 1 application topically as directed.  . diphenoxylate-atropine (LOMOTIL) 2.5-0.025 MG tablet Take one tablet one every other day, as needed, for loose stool  . glucose blood test strip 1 each by Other route 2 (two) times daily. Use as instructed bid E11.65  . Insulin Pen Needle (B-D ULTRAFINE III SHORT PEN) 31G X 8 MM MISC 1 each by Does not apply route as directed.  . levETIRAcetam (KEPPRA) 500 MG tablet Take 250 mg by  mouth at bedtime.   Marland Kitchen levothyroxine (SYNTHROID) 112 MCG tablet TAKE 1 TABLET DAILY BEFORE BREAKFAST  . Multiple Vitamins-Minerals (MULTIVITAMIN WITH MINERALS) tablet Take 1 tablet by mouth daily.  Marland Kitchen olopatadine (PATANOL) 0.1 % ophthalmic solution INSTILL 1 DROP INTO EACH EYE TWICE DAILY  . OneTouch Delica Lancets 26Z MISC 1 each by Other route 2 (two) times daily.  . pravastatin (PRAVACHOL) 20 MG tablet TAKE 1 TABLET DAILY  . tamsulosin (FLOMAX) 0.4 MG CAPS capsule Take 0.4 mg by mouth daily.  . Vitamin D, Ergocalciferol, (DRISDOL) 1.25 MG (50000 UNIT) CAPS capsule TAKE 1 CAPSULE ONCE A WEEK   No facility-administered encounter medications on file as of 06/28/2020.   ALLERGIES: Allergies  Allergen Reactions  . Bayer Advanced Aspirin [Aspirin] Nausea And Vomiting  . Penicillins Nausea And Vomiting    Has  patient had a PCN reaction causing immediate rash, facial/tongue/throat swelling, SOB or lightheadedness with hypotension: unknown Has patient had a PCN reaction causing severe rash involving mucus membranes or skin necrosis: unknown Has patient had a PCN reaction that required hospitalization: unknown Has patient had a PCN reaction occurring within the last 10 years: no If all of the above answers are "NO", then may proceed with Cephalosporin use.   VACCINATION STATUS: Immunization History  Administered Date(s) Administered  . Fluad Quad(high Dose 65+) 08/07/2019  . H1N1 11/14/2008  . Influenza Split 10/07/2011, 09/08/2012  . Influenza Whole 08/22/2007, 08/27/2010  . Influenza,inj,Quad PF,6+ Mos 08/22/2013, 09/26/2014, 09/26/2015, 09/01/2016, 09/20/2017, 09/26/2018  . Moderna SARS-COVID-2 Vaccination 01/01/2020, 02/01/2020  . Pneumococcal Conjugate-13 06/12/2014  . Pneumococcal Polysaccharide-23 05/21/2004  . Td 05/21/2004  . Tdap 10/07/2011    Diabetes He presents for his follow-up diabetic visit. He has type 2 diabetes mellitus. The initial diagnosis of diabetes was made 20  years (He was diagnosed at approximate age of 82 years.) ago. His disease course has been improving. There are no hypoglycemic associated symptoms. Pertinent negatives for hypoglycemia include no confusion, headaches, pallor or seizures. There are no diabetic associated symptoms. Pertinent negatives for diabetes include no chest pain, no fatigue, no polydipsia, no polyphagia, no polyuria and no weakness. There are no hypoglycemic complications. Symptoms are improving. Diabetic complications include nephropathy and PVD. Risk factors for coronary artery disease include diabetes mellitus, dyslipidemia, male sex, obesity and sedentary lifestyle. Current diabetic treatment includes insulin injections. He is compliant with treatment most of the time. His weight is stable. He is following a diabetic and generally healthy diet. When asked about meal planning, he reported none. He never participates in exercise. His home blood glucose trend is fluctuating minimally. His breakfast blood glucose range is generally 110-130 mg/dl. His dinner blood glucose range is generally >200 mg/dl. (He presents with continued improvement in his glycemic profile.  His fasting blood sugars are on target.  His postprandial sugars are slightly above target.  His recent A1c is 5.8% improving slightly from last visit at 5.9%.  )  Hyperlipidemia This is a chronic problem. The current episode started more than 1 year ago. Exacerbating diseases include diabetes and hypothyroidism. Pertinent negatives include no chest pain, myalgias or shortness of breath. Current antihyperlipidemic treatment includes statins. Risk factors for coronary artery disease include dyslipidemia, diabetes mellitus, hypertension, male sex and a sedentary lifestyle.  Hypertension This is a chronic problem. The current episode started more than 1 year ago. Pertinent negatives include no chest pain, headaches, neck pain, palpitations or shortness of breath. Risk factors for  coronary artery disease include diabetes mellitus, dyslipidemia and sedentary lifestyle. Past treatments include direct vasodilators. Hypertensive end-organ damage includes PVD. Identifiable causes of hypertension include a thyroid problem.  Thyroid Problem Presents for follow-up visit. Patient reports no constipation, diarrhea, fatigue or palpitations. The symptoms have been stable. Past treatments include levothyroxine. His past medical history is significant for diabetes and hyperlipidemia.   Review of systems: Limited as above.   Objective:    BP (!) 148/80   Pulse 76   Ht 5\' 8"  (1.727 m)   BMI 25.54 kg/m   Wt Readings from Last 3 Encounters:  02/27/20 168 lb (76.2 kg)  10/17/19 168 lb (76.2 kg)  09/28/19 168 lb (76.2 kg)      Physical Exam- Limited  Constitutional:  Body mass index is 25.54 kg/m. , not in acute distress, normal state of mind Eyes:  EOMI, no  exophthalmos Neck: Supple Cardiovascular: RRR, no murmers, rubs, or gallops, mild pitting bilateral lower extremity edema noted Respiratory: Adequate breathing efforts, no crackles, rales, ronchi Musculoskeletal: no gross deformities, walks short distances with a rolling walker- long distances uses wheelchair Skin:  no rashes, no hyperemia Neurological: no tremor with outstretched hands,   Foot exam:   No rashes, ulcers, cuts, calluses, onychodystrophy.   Weak pulses bilat.  Good sensation to 10 g monofilament bilat.    CMP     Component Value Date/Time   NA 139 02/07/2020 1102   K 5.2 02/07/2020 1102   CL 106 02/07/2020 1102   CO2 25 02/07/2020 1102   GLUCOSE 178 (H) 02/07/2020 1102   BUN 40 (H) 02/07/2020 1102   CREATININE 3.13 (H) 02/07/2020 1102   CALCIUM 9.6 02/07/2020 1102   PROT 6.7 02/07/2020 1102   ALBUMIN 3.7 05/27/2018 1016   AST 20 02/07/2020 1102   ALT 11 02/07/2020 1102   ALKPHOS 118 05/27/2018 1016   BILITOT 0.4 02/07/2020 1102   GFRNONAA 14 (L) 10/17/2019 1359   GFRAA 16 (L)  10/17/2019 1359   Diabetic Labs (most recent): Lab Results  Component Value Date   HGBA1C 5.8 (A) 06/28/2020   HGBA1C 5.9 (H) 02/07/2020   HGBA1C 5.9 (H) 07/03/2019    Lipid Panel     Component Value Date/Time   CHOL 180 07/03/2019 1509   TRIG 85 07/03/2019 1509   HDL 68 07/03/2019 1509   CHOLHDL 2.6 07/03/2019 1509   VLDL 8 03/12/2016 1139   LDLCALC 94 07/03/2019 1509     Assessment & Plan:   1.  type 2 diabetes mellitus with stage 4 chronic kidney disease, with long-term current use of insulin ([place]) -his diabetes is  complicated by chronic kidney disease stage IV and patient remains at a high risk for more acute and chronic complications of diabetes which include CAD, CVA, CKD, retinopathy, and neuropathy. These are all discussed in detail with the patient.  Wife reports has not seen ophthalmologist in a few years.  Encouraged her to reach back out to them for annual diabetic eye exam.  -His wife is assisting on 100% of his care.   He presents with continued improvement in his glycemic profile.  His fasting blood sugars are on target.  His postprandial sugars are slightly above target.  His recent A1c is 5.8% improving slightly from last visit at 5.9%.   - I have re-counseled the patient on diet management   by adopting a carbohydrate restricted / protein rich  Diet. -He is advised to avoid excessively processed carbs including soda, ice cream, and sweet tea.                                           - Patient is advised to stick to a routine mealtimes to eat 3 meals  a day and avoid unnecessary snacks ( to snack only to correct hypoglycemia).  - I have approached patient with the following individualized plan to manage diabetes and patient agrees.  -Advised patient and wife to continue on current regimen of Novolin 70/30 10 units twice a day with breakfast and supper when premeal blood glucose is greater than 150 and eating.  They are advised to continue monitoring blood  sugars twice a day- before breakfast and before supper.   - Patient is warned not to take insulin without  proper monitoring per orders. -Adjustment parameters are given for hypo and hyperglycemia in writing. -They are encouraged to call clinic  for blood glucose levels less than 70 or above 200 mg /dl. -Patient is not a candidate for metformin,SGLT2 inhibitors due to CKD.  2) BP/HTN: His blood pressure today is within an acceptable range for his age.  Taking amlodipine 10 mg p.o. daily.   3) Lipids/HPL: His lipid panel has been stable for 2 years.  He remains on pravastatin 20 mg p.o. nightly.  He is due to have lipid panel checked before next visit in 3 months.   4) hypothyroidism:   -I advised him to continue levothyroxine 112 mcg p.o. daily before breakfast.  We will recheck TSH and free T4 levels before next visit.   - We discussed about the correct intake of his thyroid hormone, on empty stomach at fasting, with water, separated by at least 30 minutes from breakfast and other medications,  and separated by more than 4 hours from calcium, iron, multivitamins, acid reflux medications (PPIs). -Patient is made aware of the fact that thyroid hormone replacement is needed for life, dose to be adjusted by periodic monitoring of thyroid function tests.  - I advised patient to maintain close follow up with [name], MD for primary care needs.  - Time spent on this patient care encounter:  35 min, of which > 50% was spent in  counseling and the rest reviewing his blood glucose logs , discussing his hypoglycemia and hyperglycemia episodes, reviewing his current and  previous labs / studies  ( including abstraction from other facilities) and medications  doses and developing a  long term treatment plan and documenting his care.   Please refer to Patient Instructions for Blood Glucose Monitoring and Insulin/Medications Dosing Guide"  in media tab for additional information. Please  also  refer to " Patient Self Inventory" in the Media  tab for reviewed elements of pertinent patient history.  [name] and his wife participated in the discussions, expressed understanding, and voiced agreement with the above plans.  All questions were answered to his satisfaction. he is encouraged to contact clinic should he have any questions or concerns prior to his return visit.  Follow up plan: -Return in about 3 months (around 09/28/2020) for diabetes follow up with previsit labs and a1c and urine micro in office.Rayetta Pigg, FNP-BC [facility] Phone: [phone] Fax: [phone]  06/28/2020, 12:30 PM

## 2020-07-02 ENCOUNTER — Other Ambulatory Visit: Payer: Self-pay | Admitting: "Endocrinology

## 2020-07-03 ENCOUNTER — Ambulatory Visit (INDEPENDENT_AMBULATORY_CARE_PROVIDER_SITE_OTHER): Payer: Medicare Other | Admitting: Family Medicine

## 2020-07-03 ENCOUNTER — Other Ambulatory Visit: Payer: Self-pay

## 2020-07-03 ENCOUNTER — Encounter: Payer: Self-pay | Admitting: Family Medicine

## 2020-07-03 VITALS — BP 136/72 | HR 76 | Ht 68.0 in | Wt 171.1 lb

## 2020-07-03 DIAGNOSIS — E1165 Type 2 diabetes mellitus with hyperglycemia: Secondary | ICD-10-CM | POA: Diagnosis not present

## 2020-07-03 DIAGNOSIS — G40209 Localization-related (focal) (partial) symptomatic epilepsy and epileptic syndromes with complex partial seizures, not intractable, without status epilepticus: Secondary | ICD-10-CM | POA: Diagnosis not present

## 2020-07-03 DIAGNOSIS — E782 Mixed hyperlipidemia: Secondary | ICD-10-CM

## 2020-07-03 DIAGNOSIS — E1122 Type 2 diabetes mellitus with diabetic chronic kidney disease: Secondary | ICD-10-CM | POA: Diagnosis not present

## 2020-07-03 DIAGNOSIS — R2689 Other abnormalities of gait and mobility: Secondary | ICD-10-CM | POA: Diagnosis not present

## 2020-07-03 DIAGNOSIS — R32 Unspecified urinary incontinence: Secondary | ICD-10-CM

## 2020-07-03 DIAGNOSIS — R197 Diarrhea, unspecified: Secondary | ICD-10-CM | POA: Diagnosis not present

## 2020-07-03 DIAGNOSIS — I10 Essential (primary) hypertension: Secondary | ICD-10-CM

## 2020-07-03 DIAGNOSIS — IMO0002 Reserved for concepts with insufficient information to code with codable children: Secondary | ICD-10-CM

## 2020-07-03 DIAGNOSIS — E559 Vitamin D deficiency, unspecified: Secondary | ICD-10-CM

## 2020-07-03 DIAGNOSIS — N184 Chronic kidney disease, stage 4 (severe): Secondary | ICD-10-CM

## 2020-07-03 DIAGNOSIS — M1712 Unilateral primary osteoarthritis, left knee: Secondary | ICD-10-CM

## 2020-07-03 DIAGNOSIS — E038 Other specified hypothyroidism: Secondary | ICD-10-CM

## 2020-07-03 DIAGNOSIS — Z794 Long term (current) use of insulin: Secondary | ICD-10-CM

## 2020-07-03 MED ORDER — POLYETHYLENE GLYCOL 3350 17 GM/SCOOP PO POWD: 17.0000 g | Freq: Every day | ORAL | 3 refills | Status: DC

## 2020-07-03 MED ORDER — DIPHENOXYLATE-ATROPINE 2.5-0.025 MG PO TABS: ORAL_TABLET | ORAL | 1 refills | Status: DC

## 2020-07-03 NOTE — Patient Instructions (Addendum)
F/u in office in 3 to 4 months, call if you need me sooner  Labs today, CBC, lipid, cmp and eGFR, Vit D  New is once daily miralax  For regular bowel movement, and only if needed for loose stool , lomotil one daily  You are referred to Dr Aline Brochure re knees  Be careful not to fall  Thanks for choosing [facility], we consider it a privelige to serve you.

## 2020-07-04 ENCOUNTER — Other Ambulatory Visit: Payer: Self-pay | Admitting: "Endocrinology

## 2020-07-04 ENCOUNTER — Other Ambulatory Visit: Payer: Self-pay

## 2020-07-04 ENCOUNTER — Telehealth: Payer: Self-pay

## 2020-07-04 DIAGNOSIS — I10 Essential (primary) hypertension: Secondary | ICD-10-CM

## 2020-07-04 DIAGNOSIS — E038 Other specified hypothyroidism: Secondary | ICD-10-CM

## 2020-07-04 DIAGNOSIS — E1122 Type 2 diabetes mellitus with diabetic chronic kidney disease: Secondary | ICD-10-CM

## 2020-07-04 DIAGNOSIS — E782 Mixed hyperlipidemia: Secondary | ICD-10-CM

## 2020-07-04 DIAGNOSIS — E559 Vitamin D deficiency, unspecified: Secondary | ICD-10-CM

## 2020-07-04 DIAGNOSIS — IMO0002 Reserved for concepts with insufficient information to code with codable children: Secondary | ICD-10-CM

## 2020-07-04 NOTE — Telephone Encounter (Signed)
Advised spouse labs were changed. I also advised her she would have to call Dr.Nida's office to have his labs changed as we are unable to change another providers labs. Verbalized understanding.

## 2020-07-04 NOTE — Telephone Encounter (Signed)
Pt is calling states, she cant go to [facility], they dont have a wheelchair,  So I advised that we would send the labs to [facility],,please change the order   Please change the orders for Dr Nida \\Whitney  Also.

## 2020-07-05 ENCOUNTER — Other Ambulatory Visit: Payer: Self-pay

## 2020-07-05 DIAGNOSIS — I10 Essential (primary) hypertension: Secondary | ICD-10-CM | POA: Diagnosis not present

## 2020-07-05 DIAGNOSIS — E782 Mixed hyperlipidemia: Secondary | ICD-10-CM | POA: Diagnosis not present

## 2020-07-05 DIAGNOSIS — E559 Vitamin D deficiency, unspecified: Secondary | ICD-10-CM | POA: Diagnosis not present

## 2020-07-05 DIAGNOSIS — E038 Other specified hypothyroidism: Secondary | ICD-10-CM

## 2020-07-06 ENCOUNTER — Other Ambulatory Visit: Payer: Self-pay | Admitting: Family Medicine

## 2020-07-06 LAB — LIPID PANEL
Cholesterol: 176 mg/dL (ref <200)
HDL: 76 mg/dL (ref >=40)
LDL Cholesterol (Calc): 88 mg/dL (calc)
Non-HDL Cholesterol (Calc): 100 mg/dL (calc) (ref <130)
Total CHOL/HDL Ratio: 2.3 (calc) (ref <5.0)
Triglycerides: 42 mg/dL (ref <150)

## 2020-07-06 LAB — CBC
HCT: 29.3 % — ABNORMAL LOW (ref 38.5–50.0)
Hemoglobin: 9.6 g/dL — ABNORMAL LOW (ref 13.2–17.1)
MCH: 31.4 pg (ref 27.0–33.0)
MCHC: 32.8 g/dL (ref 32.0–36.0)
MCV: 95.8 fL (ref 80.0–100.0)
MPV: 10.1 fL (ref 7.5–12.5)
Platelets: 224 10*3/uL (ref 140–400)
RBC: 3.06 10*6/uL — ABNORMAL LOW (ref 4.20–5.80)
RDW: 13.3 % (ref 11.0–15.0)
WBC: 6.2 10*3/uL (ref 3.8–10.8)

## 2020-07-06 LAB — COMPLETE METABOLIC PANEL WITH GFR
AG Ratio: 1.2 (calc) (ref 1.0–2.5)
ALT: 11 U/L (ref 9–46)
AST: 20 U/L (ref 10–35)
Albumin: 3.8 g/dL (ref 3.6–5.1)
Alkaline phosphatase (APISO): 76 U/L (ref 35–144)
BUN/Creatinine Ratio: 13 (calc) (ref 6–22)
BUN: 42 mg/dL — ABNORMAL HIGH (ref 7–25)
CO2: 25 mmol/L (ref 20–32)
Calcium: 9.6 mg/dL (ref 8.6–10.3)
Chloride: 109 mmol/L (ref 98–110)
Creat: 3.23 mg/dL — ABNORMAL HIGH (ref 0.70–1.11)
GFR, Est African American: 19 mL/min/{1.73_m2} — ABNORMAL LOW (ref >=60)
GFR, Est Non African American: 16 mL/min/{1.73_m2} — ABNORMAL LOW (ref >=60)
Globulin: 3.2 g/dL (calc) (ref 1.9–3.7)
Glucose, Bld: 82 mg/dL (ref 65–99)
Potassium: 4.3 mmol/L (ref 3.5–5.3)
Sodium: 143 mmol/L (ref 135–146)
Total Bilirubin: 0.4 mg/dL (ref 0.2–1.2)
Total Protein: 7 g/dL (ref 6.1–8.1)

## 2020-07-06 LAB — VITAMIN D 25 HYDROXY (VIT D DEFICIENCY, FRACTURES): Vit D, 25-Hydroxy: 77 ng/mL (ref 30–100)

## 2020-07-06 LAB — T4, FREE: Free T4: 1 ng/dL (ref 0.8–1.8)

## 2020-07-06 LAB — TSH: TSH: 4.95 mIU/L — ABNORMAL HIGH (ref 0.40–4.50)

## 2020-07-08 ENCOUNTER — Encounter: Payer: Self-pay | Admitting: Family Medicine

## 2020-07-08 NOTE — Assessment & Plan Note (Signed)
Chase [name] is reminded of the importance of commitment to daily physical activity for 30 minutes or more, as able and the need to limit carbohydrate intake to 30 to 60 grams per meal to help with blood sugar control.   The need to take medication as prescribed, test blood sugar as directed, and to call between visits if there is a concern that blood sugar is uncontrolled is also discussed.   Chase [name] is reminded of the importance of daily foot exam, annual eye examination, and good blood sugar, blood pressure and cholesterol control. Managed by endo  Diabetic Labs Latest Ref Rng & Units 07/05/2020 06/28/2020 02/07/2020 10/17/2019 07/03/2019  HbA1c 4.0 - 5.6 % - 5.8(A) 5.9(H) - 5.9(H)  Microalbumin Not Estab. ug/mL - - - - -  Micro/Creat Ratio 0.0 - 30.0 mg/g creat - - - - -  Chol <200 mg/dL 176 - - - 180  HDL > OR = 40 mg/dL 76 - - - 68  Calc LDL mg/dL (calc) 88 - - - 94  Triglycerides <150 mg/dL 42 - - - 85  Creatinine 0.70 - 1.11 mg/dL 3.23(H) - 3.13(H) 3.78(H) 3.55(H)   BP/Weight 07/03/2020 06/28/2020 02/27/2020 10/17/2019 09/28/2019 7/37/1062 05/15/4853  Systolic BP 627 035 009 381 829 937 169  Diastolic BP 72 80 66 64 70 70 72  Wt. (Lbs) 171.12 - 168 168 168 168 173  BMI 26.02 25.54 25.54 25.54 25.54 25.54 26.3   Foot/eye exam completion dates Latest Ref Rng & Units 12/20/2018 02/25/2017  Eye Exam No Retinopathy - -  Foot exam Order - - -  Foot Form Completion - Done Done

## 2020-07-08 NOTE — Assessment & Plan Note (Addendum)
Needs incontinece supplies but unable to get covrage with his ins

## 2020-07-08 NOTE — Assessment & Plan Note (Signed)
Controlled, no change in medication DASH diet and commitment to daily physical activity for a minimum of 30 minutes discussed and encouraged, as a part of hypertension management. The importance of attaining a healthy weight is also discussed.  BP/Weight 07/03/2020 06/28/2020 02/27/2020 10/17/2019 09/28/2019 3/70/0525 08/07/288  Systolic BP 022 840 698 614 830 735 430  Diastolic BP 72 80 66 64 70 70 72  Wt. (Lbs) 171.12 - 168 168 168 168 173  BMI 26.02 25.54 25.54 25.54 25.54 25.54 26.3

## 2020-07-08 NOTE — Assessment & Plan Note (Signed)
Increased pain and stiffness with reduced mobility, refer Ortho

## 2020-07-08 NOTE — Assessment & Plan Note (Signed)
Controlled, no change in medication No recent seizure history

## 2020-07-08 NOTE — Assessment & Plan Note (Signed)
Start daily miralax, use lomiotil if stool gets watery and frequent

## 2020-07-08 NOTE — Assessment & Plan Note (Signed)
Home safety reviewed 

## 2020-07-08 NOTE — Progress Notes (Signed)
Chase [name]     MRN: [id number]      DOB: Feb 24, 1931   HPI Chase [name] is here for follow up and re-evaluation of chronic medical conditions, medication management and review of any available recent lab and radiology data.  Preventive health is updated, specifically  Cancer screening and Immunization.   Questions or concerns regarding consultations or procedures which the PT has had in the interim are  addressed. C/o increased episodes of incontinence and loose stool  C/o knee pain and stiffness, denies falls ROS Denies recent fever or chills. Denies sinus pressure, nasal congestion, ear pain or sore throat. Denies chest congestion, productive cough or wheezing. Denies chest pains, palpitations and leg swelling    Denies dysuria, frequency, hesitancy or incontinence. C/o joint pain, swelling and limitation in mobility. Denies headaches, seizures, numbness, or tingling. Denies depression, anxiety or insomnia. Denies skin break down or rash.   PE  BP (!) 136/72   Pulse 76   Ht 5\' 8"  (1.727 m)   Wt 171 lb 1.9 oz (77.6 kg)   SpO2 98%   BMI 26.02 kg/m   Patient alert and oriented and in no cardiopulmonary distress.  HEENT: No facial asymmetry, EOMI,     Neck supple .  Chest: Clear to auscultation bilaterally.  CVS: S1, S2 no murmurs, no S3.Regular rate.  ABD: Soft non tender.   Ext: No edema  MS: Decreased  ROM spine, shoulders, hips and knees.  Skin: Intact, no ulcerations or rash noted.  Psych: Good eye contact, normal affect not anxious or depressed appearing.  CNS: CN 2-12 intact, power,  normal throughout.no focal deficits noted.   Assessment & Plan  Incontinence Needs incontinece supplies but unable to get covrage with his ins  Diarrhea Start daily miralax, use lomiotil if stool gets watery and frequent  Osteoarthritis of left knee Increased pain and stiffness with reduced mobility, refer Ortho  Essential hypertension, benign Controlled, no change  in medication DASH diet and commitment to daily physical activity for a minimum of 30 minutes discussed and encouraged, as a part of hypertension management. The importance of attaining a healthy weight is also discussed.  BP/Weight 07/03/2020 06/28/2020 02/27/2020 10/17/2019 09/28/2019 05/28/2978 07/15/2118  Systolic BP 417 408 144 818 563 149 702  Diastolic BP 72 80 66 64 70 70 72  Wt. (Lbs) 171.12 - 168 168 168 168 173  BMI 26.02 25.54 25.54 25.54 25.54 25.54 26.3       Poor balance Home safety reviewed  Seizure disorder, complex partial (HCC) Controlled, no change in medication No recent seizure history  Uncontrolled type 2 diabetes mellitus with stage 4 chronic kidney disease, with long-term current use of insulin [facility]) Chase [name] is reminded of the importance of commitment to daily physical activity for 30 minutes or more, as able and the need to limit carbohydrate intake to 30 to 60 grams per meal to help with blood sugar control.   The need to take medication as prescribed, test blood sugar as directed, and to call between visits if there is a concern that blood sugar is uncontrolled is also discussed.   Chase [name] is reminded of the importance of daily foot exam, annual eye examination, and good blood sugar, blood pressure and cholesterol control. Managed by endo  Diabetic Labs Latest Ref Rng & Units 07/05/2020 06/28/2020 02/07/2020 10/17/2019 07/03/2019  HbA1c 4.0 - 5.6 % - 5.8(A) 5.9(H) - 5.9(H)  Microalbumin Not Estab. ug/mL - - - - -  Micro/Creat  Ratio 0.0 - 30.0 mg/g creat - - - - -  Chol <200 mg/dL 176 - - - 180  HDL > OR = 40 mg/dL 76 - - - 68  Calc LDL mg/dL (calc) 88 - - - 94  Triglycerides <150 mg/dL 42 - - - 85  Creatinine 0.70 - 1.11 mg/dL 3.23(H) - 3.13(H) 3.78(H) 3.55(H)   BP/Weight 07/03/2020 06/28/2020 02/27/2020 10/17/2019 09/28/2019 2/48/2500 02/11/487  Systolic BP 891 694 503 888 280 034 917  Diastolic BP 72 80 66 64 70 70 72  Wt. (Lbs) 171.12 - 168 168 168 168  173  BMI 26.02 25.54 25.54 25.54 25.54 25.54 26.3   Foot/eye exam completion dates Latest Ref Rng & Units 12/20/2018 02/25/2017  Eye Exam No Retinopathy - -  Foot exam Order - - -  Foot Form Completion - Done Done        Hypothyroidism Managed by Endo

## 2020-07-08 NOTE — Assessment & Plan Note (Signed)
Managed by Endo 

## 2020-07-09 ENCOUNTER — Telehealth: Payer: Self-pay | Admitting: Family Medicine

## 2020-07-09 NOTE — Telephone Encounter (Signed)
She said today they got 3 big bottles of miralax and she is scared to give it to him because it says to relieve constipation and hes not having constipation but hes having loose stool and it leaks everywhere and he usually has leaked bm in the am when he gets up and thinks the miralax will make it worse and wants to know if she should stillgive it to him.  Also she is wanting a letter to have her mailbox moved to the top of her driveway instead of the end of the road because its dangerous for her to walk that far by herself and the mailman said she just needs a letter from the dr

## 2020-07-09 NOTE — Telephone Encounter (Signed)
Patients wife would like someone to call her and go over the medication prescribed to pt for constipation. [phone]

## 2020-07-09 NOTE — Telephone Encounter (Signed)
Miralax is a fiber additive, which woill hopefully help to get stool  more formed, so yes please have her take as directed. Letter can e written on behalf of our pt stating he is incapable of getting to roadside due to medical conditions

## 2020-07-09 NOTE — Telephone Encounter (Signed)
Pt's wife called and wanted someone to call her back and go over a medication he [place]

## 2020-07-09 NOTE — Telephone Encounter (Signed)
Patient wife aware and letter mailed

## 2020-07-22 DIAGNOSIS — E1142 Type 2 diabetes mellitus with diabetic polyneuropathy: Secondary | ICD-10-CM | POA: Diagnosis not present

## 2020-07-22 DIAGNOSIS — R2689 Other abnormalities of gait and mobility: Secondary | ICD-10-CM | POA: Diagnosis not present

## 2020-07-22 DIAGNOSIS — Z79899 Other long term (current) drug therapy: Secondary | ICD-10-CM | POA: Diagnosis not present

## 2020-07-22 DIAGNOSIS — G40119 Localization-related (focal) (partial) symptomatic epilepsy and epileptic syndromes with simple partial seizures, intractable, without status epilepticus: Secondary | ICD-10-CM | POA: Diagnosis not present

## 2020-07-26 ENCOUNTER — Ambulatory Visit: Payer: Medicare Other | Admitting: Orthopedic Surgery

## 2020-07-26 DIAGNOSIS — L851 Acquired keratosis [keratoderma] palmaris et plantaris: Secondary | ICD-10-CM | POA: Diagnosis not present

## 2020-07-26 DIAGNOSIS — E1142 Type 2 diabetes mellitus with diabetic polyneuropathy: Secondary | ICD-10-CM | POA: Diagnosis not present

## 2020-07-26 DIAGNOSIS — I83023 Varicose veins of left lower extremity with ulcer of ankle: Secondary | ICD-10-CM | POA: Diagnosis not present

## 2020-07-26 DIAGNOSIS — B351 Tinea unguium: Secondary | ICD-10-CM | POA: Diagnosis not present

## 2020-07-29 ENCOUNTER — Other Ambulatory Visit: Payer: Self-pay | Admitting: "Endocrinology

## 2020-07-30 ENCOUNTER — Ambulatory Visit: Payer: Medicare Other | Admitting: Family Medicine

## 2020-07-31 ENCOUNTER — Other Ambulatory Visit: Payer: Self-pay | Admitting: Family Medicine

## 2020-08-02 DIAGNOSIS — I83023 Varicose veins of left lower extremity with ulcer of ankle: Secondary | ICD-10-CM | POA: Diagnosis not present

## 2020-08-02 DIAGNOSIS — E1142 Type 2 diabetes mellitus with diabetic polyneuropathy: Secondary | ICD-10-CM | POA: Diagnosis not present

## 2020-08-02 DIAGNOSIS — M79671 Pain in right foot: Secondary | ICD-10-CM | POA: Diagnosis not present

## 2020-08-05 ENCOUNTER — Other Ambulatory Visit: Payer: Self-pay | Admitting: Orthopedic Surgery

## 2020-08-05 ENCOUNTER — Other Ambulatory Visit: Payer: Self-pay

## 2020-08-05 ENCOUNTER — Ambulatory Visit: Payer: Medicare Other

## 2020-08-05 ENCOUNTER — Encounter: Payer: Self-pay | Admitting: Orthopedic Surgery

## 2020-08-05 ENCOUNTER — Ambulatory Visit (INDEPENDENT_AMBULATORY_CARE_PROVIDER_SITE_OTHER): Payer: Medicare Other | Admitting: Orthopedic Surgery

## 2020-08-05 VITALS — BP 154/74 | HR 72 | Ht 68.0 in | Wt 171.0 lb

## 2020-08-05 DIAGNOSIS — G8929 Other chronic pain: Secondary | ICD-10-CM

## 2020-08-05 DIAGNOSIS — M25562 Pain in left knee: Secondary | ICD-10-CM | POA: Diagnosis not present

## 2020-08-05 DIAGNOSIS — M25561 Pain in right knee: Secondary | ICD-10-CM

## 2020-08-05 NOTE — Progress Notes (Signed)
NEW PROBLEM//OFFICE VISIT  Chief Complaint  Patient presents with  . Knee Pain    bilateral     86 year old male right sided stroke residual trouble walking bilateral knee pain comes in for evaluation.  Too many medical problems to operate considering alternative treatments   Review of Systems  Musculoskeletal: Positive for joint pain.  Neurological: Positive for focal weakness.     Past Medical History:  Diagnosis Date  . Bradycardia 03/15/2012  . Carotid artery occlusion   . Choledocholithiasis 02/25/2018  . CKD (chronic kidney disease) stage 4, GFR 15-29 ml/min (HCC)   . Complete lesion of cervical spinal cord ([place]) 03/14/3012   Stable since 2006  . CVA (cerebrovascular accident) ([place]) 09/07/12   right sided weakness  . Depressive disorder, not elsewhere classified   . Diabetes mellitus approx 1994  . Diabetic neuropathy ([place])   . History of kidney stones   . Hypertensive heart disease   . Hypothyroidism approx 2000  . Lacunar stroke, acute ([place]) 03/14/2012  . Obesity   . Osteoarthrosis, unspecified whether generalized or localized, lower leg   . Other and unspecified hyperlipidemia   . Peripheral vascular disease, unspecified ([place])   . Seizures ([place])    unknown etiology; on meds, last seizure was 2015  . Spinal stenosis, unspecified region other than cervical   . Spondylosis of unspecified site without mention of myelopathy     Past Surgical History:  Procedure Laterality Date  . BILIARY STENT PLACEMENT N/A 05/27/2018   Procedure: BILIARY STENT PLACEMENT;  Surgeon: Rogene Houston, MD;  [facility];  Service: Endoscopy;  Laterality: N/A;  . COLONOSCOPY N/A 08/10/2013   Procedure: COLONOSCOPY;  Surgeon: Rogene Houston, MD;  [facility];  Service: Endoscopy;  Laterality: N/A;  240  . ERCP N/A 03/02/2018   Procedure: ENDOSCOPIC RETROGRADE CHOLANGIOPANCREATOGRAPHY (ERCP) With sphincterotomy and stent placement;  Surgeon: Rogene Houston, MD;   [facility];  Service: Gastroenterology;  Laterality: N/A;  . ERCP N/A 05/27/2018   Procedure: ENDOSCOPIC RETROGRADE CHOLANGIOPANCREATOGRAPHY (ERCP);  Surgeon: Rogene Houston, MD;  [facility];  Service: Endoscopy;  Laterality: N/A;  . ERCP N/A 09/16/2018   Procedure: ENDOSCOPIC RETROGRADE CHOLANGIOPANCREATOGRAPHY (ERCP);  Surgeon: Rogene Houston, MD;  [facility];  Service: Endoscopy;  Laterality: N/A;  . GASTROINTESTINAL STENT REMOVAL N/A 05/27/2018   Procedure: Biliary STENT REMOVAL;  Surgeon: Rogene Houston, MD;  [facility];  Service: Endoscopy;  Laterality: N/A;  . GASTROINTESTINAL STENT REMOVAL N/A 09/16/2018   Procedure: GASTROINTESTINAL STENT REMOVAL;  Surgeon: Rogene Houston, MD;  [facility];  Service: Endoscopy;  Laterality: N/A;  . kidney stones left x2  1975  . KIDNEY SURGERY     Ruptured left kidney 30 yrs ago  from a kidney stone  . LITHOTRIPSY N/A 05/27/2018   Procedure: MECHANICAL LITHOTRIPSY;  Surgeon: Rogene Houston, MD;  [facility];  Service: Endoscopy;  Laterality: N/A;  . REMOVAL OF STONES N/A 09/16/2018   Procedure: REMOVAL OF MULTIPLE STONES WITH BASKET AND BALLOON;  Surgeon: Rogene Houston, MD;  [facility];  Service: Endoscopy;  Laterality: N/A;  . SPHINCTEROTOMY N/A 05/27/2018   Procedure: SPHINCTEROTOMY extended;  Surgeon: Rogene Houston, MD;  [facility];  Service: Endoscopy;  Laterality: N/A;  . SPYGLASS CHOLANGIOSCOPY N/A 05/27/2018   Procedure: EUMPNTIR CHOLANGIOSCOPY;  Surgeon: Rogene Houston, MD;  [facility];  Service:  Endoscopy;  Laterality: N/A;    Family History  Problem Relation Age of Onset  . Diabetes Mother   . Prostate cancer Father   . Hypertension Brother   . Hypertension Brother   . Diabetes Brother   . Stroke Brother   . Diabetes Brother   . Diabetes Daughter   . Diabetes Daughter    Social History   Tobacco Use  .  Smoking status: Never Smoker  . Smokeless tobacco: Never Used  Vaping Use  . Vaping Use: Never used  Substance Use Topics  . Alcohol use: No    Alcohol/week: 0.0 standard drinks  . Drug use: No    Allergies  Allergen Reactions  . Bayer Advanced Aspirin [Aspirin] Nausea And Vomiting  . Penicillins Nausea And Vomiting    Has patient had a PCN reaction causing immediate rash, facial/tongue/throat swelling, SOB or lightheadedness with hypotension: unknown Has patient had a PCN reaction causing severe rash involving mucus membranes or skin necrosis: unknown Has patient had a PCN reaction that required hospitalization: unknown Has patient had a PCN reaction occurring within the last 10 years: no If all of the above answers are "NO", then may proceed with Cephalosporin use.    Current Meds  Medication Sig  . amLODipine (NORVASC) 10 MG tablet TAKE 1 TABLET DAILY  . aspirin EC 81 MG tablet Take 1 tablet (81 mg total) by mouth daily.  . calcium carbonate (TUMS - DOSED IN MG ELEMENTAL CALCIUM) 500 MG chewable tablet Chew 1 tablet by mouth 2 (two) times daily. For Bone Health  . clopidogrel (PLAVIX) 75 MG tablet TAKE 1 TABLET DAILY  . diphenoxylate-atropine (LOMOTIL) 2.5-0.025 MG tablet Take one tablet by mouth once daily for loose watery stool  . HUMULIN 70/30 KWIKPEN (70-30) 100 UNIT/ML KwikPen INJECT 10 UNITS UNDER THE SKIN TWICE A DAY  . Insulin Pen Needle (B-D ULTRAFINE III SHORT PEN) 31G X 8 MM MISC 1 each by Does not apply route as directed.  . levETIRAcetam (KEPPRA) 500 MG tablet Take 250 mg by mouth at bedtime.   Marland Kitchen levothyroxine (SYNTHROID) 112 MCG tablet TAKE 1 TABLET DAILY BEFORE BREAKFAST  . Multiple Vitamins-Minerals (MULTIVITAMIN WITH MINERALS) tablet Take 1 tablet by mouth daily.  Marland Kitchen olopatadine (PATANOL) 0.1 % ophthalmic solution INSTILL 1 DROP INTO EACH EYE TWICE DAILY  . OneTouch Delica Lancets 29J MISC 1 each by Other route 2 (two) times daily.  Glory Rosebush ULTRA test strip  USE 1 STRIP TWICE A DAY AS INSTRUCTED  . polyethylene glycol powder (GLYCOLAX/MIRALAX) 17 GM/SCOOP powder Take 17 g by mouth daily.  . pravastatin (PRAVACHOL) 20 MG tablet TAKE 1 TABLET DAILY  . tamsulosin (FLOMAX) 0.4 MG CAPS capsule Take 0.4 mg by mouth daily.  . Vitamin D, Ergocalciferol, (DRISDOL) 1.25 MG (50000 UNIT) CAPS capsule Take 50,000 Units by mouth once a week.    BP (!) 154/74   Pulse 72   Ht 5\' 8"  (1.727 m)   Wt 171 lb (77.6 kg)   BMI 26.00 kg/m   Physical Exam Patient is contracted in his upper extremities he has a weak voice seems to have right-sided hemiparesis  Ortho Exam  Both knees are flexion contracted with knee flexion up to 110 degrees No instability however no major malalignment is seen but difficult because the patient cannot stand gait seems to use a walker at home according to his wife but could not stand here  [place]  A.  Encounter Diagnosis  Name Primary?  Marland Kitchen  Chronic pain of both knees Yes    B. DATA ANALYSED:   IMAGING: Interpretation of images: INTERNAL: Severe arthritis bone-on-bone demineralization not a good situation   see report C. MANAGEMENT   Procedure note for bilateral knee injections  Procedure note left knee injection verbal consent was obtained to inject left knee joint  Timeout was completed to confirm the site of injection  The medications used were 40 mg of Depo-Medrol and 1% lidocaine 3 cc  Anesthesia was provided by ethyl chloride and the skin was prepped with alcohol.  After cleaning the skin with alcohol a 20-gauge needle was used to inject the left knee joint. There were no complications. A sterile bandage was applied.   Procedure note right knee injection verbal consent was obtained to inject right knee joint  Timeout was completed to confirm the site of injection  The medications used were 40 mg of Depo-Medrol and 1% lidocaine 3 cc  Anesthesia was provided by ethyl chloride and the skin  was prepped with alcohol.  After cleaning the skin with alcohol a 20-gauge needle was used to inject the right knee joint. There were no complications. A sterile bandage was applied.   Follow-up as needed No orders of the defined types were placed in this encounter.     Arther Abbott, MD  08/05/2020 2:37 PM

## 2020-08-08 NOTE — Progress Notes (Signed)
Cardiology Office Note  Date: 08/09/2020   ID: Chase [name], DOB 1931-08-09, MRN [id number]  PCP:  [name], MD  Cardiologist:  No primary care provider on file. Electrophysiologist:  None   Chief Complaint: RBBB/LAFB  History of Present Illness: Chase [name] is a 86 y.o. male with a history of right bundle branch block, left anterior fascicular block, HTN, DM 2, hyperlipidemia, chronic kidney disease stage IV, CVA, left retinal artery cholesterol embolus, common bile duct stone and biliary stent status post removal.  Last visit with Dr. Bronson Ing on 09/15/2018.  He denied any chest pain, palpitations, shortness of breath, leg swelling, orthopnea or PND.  He was scheduled to go GI procedure the following day.   He is here for follow-up today.  His wife and daughter are here accompanying him.  He has not been seen since September 15, 2018 when he saw Dr. Bronson Ing mentioned above.  Today he is sitting in a wheelchair due to mobility issues.  His wife and daughter state he has a wound on his left leg and is being treated for this wound.  He has an appointment this afternoon with podiatrist.  Has some chronic lower extremity edema.  His PCP recently had lab work drawn consisting of: Free T4 of 1.0, TSH 4.95, total cholesterol 176, HDL 76, triglycerides 42, LDL 88, CBC with hemoglobin of 9.6 and hematocrit 29.3, BUN 42, creatinine 3.23, GFR 19, vitamin D 77.  Glucose 82.  History of right-sided weakness from previous CVA with limited movement of his right arm.  He denies any recent issues of anginal or exertional symptoms, palpitations or arrhythmias.  EKG today sinus rhythm first-degree AV block with variable PR interval, left axis deviation, LVH with QRS widening, poor R wave progression.  Seeing nephrology for his stage IV renal disease.    Past Medical History:  Diagnosis Date  . Bradycardia 03/15/2012  . Carotid artery occlusion   . Choledocholithiasis 02/25/2018  . CKD  (chronic kidney disease) stage 4, GFR 15-29 ml/min (HCC)   . Complete lesion of cervical spinal cord ([place]) 03/14/3012   Stable since 2006  . CVA (cerebrovascular accident) ([place]) 09/07/12   right sided weakness  . Depressive disorder, not elsewhere classified   . Diabetes mellitus approx 1994  . Diabetic neuropathy ([place])   . History of kidney stones   . Hypertensive heart disease   . Hypothyroidism approx 2000  . Lacunar stroke, acute ([place]) 03/14/2012  . Obesity   . Osteoarthrosis, unspecified whether generalized or localized, lower leg   . Other and unspecified hyperlipidemia   . Peripheral vascular disease, unspecified ([place])   . Seizures ([place])    unknown etiology; on meds, last seizure was 2015  . Spinal stenosis, unspecified region other than cervical   . Spondylosis of unspecified site without mention of myelopathy     Past Surgical History:  Procedure Laterality Date  . BILIARY STENT PLACEMENT N/A 05/27/2018   Procedure: BILIARY STENT PLACEMENT;  Surgeon: Rogene Houston, MD;  [facility];  Service: Endoscopy;  Laterality: N/A;  . COLONOSCOPY N/A 08/10/2013   Procedure: COLONOSCOPY;  Surgeon: Rogene Houston, MD;  [facility];  Service: Endoscopy;  Laterality: N/A;  240  . ERCP N/A 03/02/2018   Procedure: ENDOSCOPIC RETROGRADE CHOLANGIOPANCREATOGRAPHY (ERCP) With sphincterotomy and stent placement;  Surgeon: Rogene Houston, MD;  [facility];  Service: Gastroenterology;  Laterality: N/A;  . ERCP N/A 05/27/2018   Procedure:  ENDOSCOPIC RETROGRADE CHOLANGIOPANCREATOGRAPHY (ERCP);  Surgeon: Rogene Houston, MD;  [facility];  Service: Endoscopy;  Laterality: N/A;  . ERCP N/A 09/16/2018   Procedure: ENDOSCOPIC RETROGRADE CHOLANGIOPANCREATOGRAPHY (ERCP);  Surgeon: Rogene Houston, MD;  [facility];  Service: Endoscopy;  Laterality: N/A;  . GASTROINTESTINAL STENT REMOVAL N/A 05/27/2018   Procedure: Biliary STENT REMOVAL;  Surgeon:  Rogene Houston, MD;  [facility];  Service: Endoscopy;  Laterality: N/A;  . GASTROINTESTINAL STENT REMOVAL N/A 09/16/2018   Procedure: GASTROINTESTINAL STENT REMOVAL;  Surgeon: Rogene Houston, MD;  [facility];  Service: Endoscopy;  Laterality: N/A;  . kidney stones left x2  1975  . KIDNEY SURGERY     Ruptured left kidney 30 yrs ago  from a kidney stone  . LITHOTRIPSY N/A 05/27/2018   Procedure: MECHANICAL LITHOTRIPSY;  Surgeon: Rogene Houston, MD;  [facility];  Service: Endoscopy;  Laterality: N/A;  . REMOVAL OF STONES N/A 09/16/2018   Procedure: REMOVAL OF MULTIPLE STONES WITH BASKET AND BALLOON;  Surgeon: Rogene Houston, MD;  [facility];  Service: Endoscopy;  Laterality: N/A;  . SPHINCTEROTOMY N/A 05/27/2018   Procedure: SPHINCTEROTOMY extended;  Surgeon: Rogene Houston, MD;  [facility];  Service: Endoscopy;  Laterality: N/A;  . SPYGLASS CHOLANGIOSCOPY N/A 05/27/2018   Procedure: OYDXAJOI CHOLANGIOSCOPY;  Surgeon: Rogene Houston, MD;  [facility];  Service: Endoscopy;  Laterality: N/A;    Current Outpatient Medications  Medication Sig Dispense Refill  . amLODipine (NORVASC) 10 MG tablet TAKE 1 TABLET DAILY 90 tablet 3  . calcium carbonate (TUMS - DOSED IN MG ELEMENTAL CALCIUM) 500 MG chewable tablet Chew 1 tablet by mouth 2 (two) times daily. For Bone Health    . clopidogrel (PLAVIX) 75 MG tablet TAKE 1 TABLET DAILY 90 tablet 3  . diphenoxylate-atropine (LOMOTIL) 2.5-0.025 MG tablet Take one tablet by mouth once daily for loose watery stool 20 tablet 1  . HUMULIN 70/30 KWIKPEN (70-30) 100 UNIT/ML KwikPen INJECT 10 UNITS UNDER THE SKIN TWICE A DAY 30 mL 3  . Insulin Pen Needle (B-D ULTRAFINE III SHORT PEN) 31G X 8 MM MISC 1 each by Does not apply route as directed. 300 each 1  . levETIRAcetam (KEPPRA) 500 MG tablet Take 250 mg by mouth at bedtime.     Marland Kitchen levothyroxine (SYNTHROID) 112 MCG tablet TAKE 1 TABLET  DAILY BEFORE BREAKFAST 90 tablet 1  . Multiple Vitamins-Minerals (MULTIVITAMIN WITH MINERALS) tablet Take 1 tablet by mouth daily.    Marland Kitchen olopatadine (PATANOL) 0.1 % ophthalmic solution INSTILL 1 DROP INTO EACH EYE TWICE DAILY 5 mL 12  . OneTouch Delica Lancets 78M MISC 1 each by Other route 2 (two) times daily. 100 each 2  . ONETOUCH ULTRA test strip USE 1 STRIP TWICE A DAY AS INSTRUCTED 300 each 3  . polyethylene glycol powder (GLYCOLAX/MIRALAX) 17 GM/SCOOP powder Take 17 g by mouth daily. 3350 g 3  . pravastatin (PRAVACHOL) 20 MG tablet TAKE 1 TABLET DAILY 90 tablet 3  . tamsulosin (FLOMAX) 0.4 MG CAPS capsule Take 0.4 mg by mouth daily.    . Vitamin D, Ergocalciferol, (DRISDOL) 1.25 MG (50000 UNIT) CAPS capsule Take 50,000 Units by mouth once a week.     No current facility-administered medications for this visit.   Allergies:  Bayer advanced aspirin [aspirin] and Penicillins   Social History: The patient  reports that he has never smoked. He  has never used smokeless tobacco. He reports that he does not drink alcohol and does not use drugs.   Family History: The patient's family history includes Diabetes in his brother, brother, daughter, daughter, and mother; Hypertension in his brother and brother; Prostate cancer in his father; Stroke in his brother.   ROS:  Please see the history of present illness. Otherwise, complete review of systems is positive for none.  All other systems are reviewed and negative.   Physical Exam: VS:  BP 120/82   Pulse 79   Ht 5\' 8"  (1.727 m)   Wt 171 lb (77.6 kg)   SpO2 99%   BMI 26.00 kg/m , BMI Body mass index is 26 kg/m.  Wt Readings from Last 3 Encounters:  08/09/20 171 lb (77.6 kg)  08/05/20 171 lb (77.6 kg)  07/03/20 171 lb 1.9 oz (77.6 kg)    General: Patient appears comfortable at rest. Neck: Supple, no elevated JVP or carotid bruits, no thyromegaly. Lungs: Clear to auscultation, nonlabored breathing at rest. Cardiac: Irregular rate and  rhythm, no S3 or significant systolic murmur, no pericardial rub. Extremities: Mild non-pitting edema laterally, distal pulses 2+. Skin: Warm and dry. Musculoskeletal: No kyphosis. Neuropsychiatric: Alert and oriented x3, affect grossly appropriate.  ECG:   08/09/2020 EKG today sinus rhythm first-degree AV block with variable PR interval, left axis deviation, LVH with QRS widening, poor R wave progression.     Recent Labwork: 07/05/2020: ALT 11; AST 20; BUN 42; Creat 3.23; Hemoglobin 9.6; Platelets 224; Potassium 4.3; Sodium 143; TSH 4.95     Component Value Date/Time   CHOL 176 07/05/2020 0950   TRIG 42 07/05/2020 0950   HDL 76 07/05/2020 0950   CHOLHDL 2.3 07/05/2020 0950   VLDL 8 03/12/2016 1139   LDLCALC 88 07/05/2020 0950    Other Studies Reviewed Today:   Assessment and Plan:  1. RBBB (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 fascicular block)   2. Essential hypertension, benign   3. Mixed hyperlipidemia   4. History of CVA (cerebrovascular accident)    1. RBBB (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 fascicular block) EKG today sinus rhythm first-degree AV block with variable PR interval, left axis deviation, LVH with QRS widening, poor R wave progression.  He is asymptomatic.  2. Essential hypertension, benign Blood pressure well controlled on current therapy.  Continue amlodipine 10 mg daily.  3. Mixed hyperlipidemia Continue Pravachol 20 mg daily.  Recent lipid panel from PCP: Total cholesterol 176, HDL 76, triglycerides 42, LDL 88,   4. History of CVA (cerebrovascular accident) Residual right upper extremity weakness/hemiparesis/contraction with limited movement.  No other focal neurologic deficits.  No new neurological symptoms.  Denies any new blurred vision, slurred speech, residual unilateral weakness other than currently existing on right side.  Continue Plavix 75 mg daily.  Medication Adjustments/Labs and Tests Ordered: Current medicines are  reviewed at length with the patient today.  Concerns regarding medicines are outlined above.   Disposition: Follow-up with Dr. Harl Bowie or [facility] 1 year.  Signed, Levell July, NP 08/09/2020 11:04 AM    [facility], [place], [place] [address] Phone: [phone]; Fax: [phone]

## 2020-08-09 ENCOUNTER — Encounter: Payer: Self-pay | Admitting: Family Medicine

## 2020-08-09 ENCOUNTER — Ambulatory Visit (INDEPENDENT_AMBULATORY_CARE_PROVIDER_SITE_OTHER): Payer: Medicare Other | Admitting: Family Medicine

## 2020-08-09 ENCOUNTER — Other Ambulatory Visit: Payer: Self-pay

## 2020-08-09 ENCOUNTER — Other Ambulatory Visit: Payer: Self-pay | Admitting: Podiatry

## 2020-08-09 ENCOUNTER — Other Ambulatory Visit (HOSPITAL_COMMUNITY): Payer: Self-pay | Admitting: Podiatry

## 2020-08-09 VITALS — BP 120/82 | HR 79 | Ht 68.0 in | Wt 171.0 lb

## 2020-08-09 DIAGNOSIS — I452 Bifascicular block: Secondary | ICD-10-CM | POA: Diagnosis not present

## 2020-08-09 DIAGNOSIS — Z8673 Personal history of transient ischemic attack (TIA), and cerebral infarction without residual deficits: Secondary | ICD-10-CM | POA: Diagnosis not present

## 2020-08-09 DIAGNOSIS — I739 Peripheral vascular disease, unspecified: Secondary | ICD-10-CM

## 2020-08-09 DIAGNOSIS — E1142 Type 2 diabetes mellitus with diabetic polyneuropathy: Secondary | ICD-10-CM | POA: Diagnosis not present

## 2020-08-09 DIAGNOSIS — I83023 Varicose veins of left lower extremity with ulcer of ankle: Secondary | ICD-10-CM | POA: Diagnosis not present

## 2020-08-09 DIAGNOSIS — M79671 Pain in right foot: Secondary | ICD-10-CM | POA: Diagnosis not present

## 2020-08-09 DIAGNOSIS — E782 Mixed hyperlipidemia: Secondary | ICD-10-CM

## 2020-08-09 DIAGNOSIS — I10 Essential (primary) hypertension: Secondary | ICD-10-CM

## 2020-08-09 NOTE — Patient Instructions (Signed)
Your physician wants you to follow-up in: 1 YEAR WITH MD You will receive a reminder letter in the mail two months in advance. If you don't receive a letter, please call our office to schedule the follow-up appointment.  Your physician recommends that you continue on your current medications as directed. Please refer to the Current Medication list given to you today.  Thank you for choosing [facility]!!   

## 2020-08-16 ENCOUNTER — Ambulatory Visit (HOSPITAL_COMMUNITY)
Admission: RE | Admit: 2020-08-16 | Discharge: 2020-08-16 | Disposition: A | Discharge status: Home / Self Care | Payer: Medicare Other | Source: Ambulatory Visit | Attending: Podiatry | Admitting: Podiatry

## 2020-08-16 ENCOUNTER — Other Ambulatory Visit: Payer: Self-pay

## 2020-08-16 DIAGNOSIS — E1142 Type 2 diabetes mellitus with diabetic polyneuropathy: Secondary | ICD-10-CM | POA: Diagnosis not present

## 2020-08-16 DIAGNOSIS — M79671 Pain in right foot: Secondary | ICD-10-CM | POA: Diagnosis not present

## 2020-08-16 DIAGNOSIS — I739 Peripheral vascular disease, unspecified: Secondary | ICD-10-CM | POA: Diagnosis not present

## 2020-08-16 DIAGNOSIS — I83023 Varicose veins of left lower extremity with ulcer of ankle: Secondary | ICD-10-CM | POA: Diagnosis not present

## 2020-08-16 DIAGNOSIS — Z872 Personal history of diseases of the skin and subcutaneous tissue: Secondary | ICD-10-CM | POA: Diagnosis not present

## 2020-08-23 DIAGNOSIS — Z79899 Other long term (current) drug therapy: Secondary | ICD-10-CM | POA: Diagnosis not present

## 2020-08-23 DIAGNOSIS — M7989 Other specified soft tissue disorders: Secondary | ICD-10-CM | POA: Diagnosis not present

## 2020-08-23 DIAGNOSIS — M79605 Pain in left leg: Secondary | ICD-10-CM | POA: Diagnosis not present

## 2020-08-23 DIAGNOSIS — Z79890 Hormone replacement therapy (postmenopausal): Secondary | ICD-10-CM | POA: Diagnosis not present

## 2020-08-23 DIAGNOSIS — I70243 Atherosclerosis of native arteries of left leg with ulceration of ankle: Secondary | ICD-10-CM | POA: Insufficient documentation

## 2020-08-23 DIAGNOSIS — M79604 Pain in right leg: Secondary | ICD-10-CM | POA: Diagnosis not present

## 2020-08-23 DIAGNOSIS — I83023 Varicose veins of left lower extremity with ulcer of ankle: Secondary | ICD-10-CM | POA: Diagnosis not present

## 2020-08-23 DIAGNOSIS — E119 Type 2 diabetes mellitus without complications: Secondary | ICD-10-CM | POA: Diagnosis not present

## 2020-08-23 DIAGNOSIS — Z7902 Long term (current) use of antithrombotics/antiplatelets: Secondary | ICD-10-CM | POA: Diagnosis not present

## 2020-08-23 DIAGNOSIS — Z794 Long term (current) use of insulin: Secondary | ICD-10-CM | POA: Diagnosis not present

## 2020-08-23 DIAGNOSIS — L97929 Non-pressure chronic ulcer of unspecified part of left lower leg with unspecified severity: Secondary | ICD-10-CM | POA: Diagnosis not present

## 2020-08-23 DIAGNOSIS — I10 Essential (primary) hypertension: Secondary | ICD-10-CM | POA: Diagnosis not present

## 2020-08-23 DIAGNOSIS — L97329 Non-pressure chronic ulcer of left ankle with unspecified severity: Secondary | ICD-10-CM | POA: Diagnosis not present

## 2020-08-23 DIAGNOSIS — Z880 Allergy status to penicillin: Secondary | ICD-10-CM | POA: Diagnosis not present

## 2020-08-23 DIAGNOSIS — E09622 Drug or chemical induced diabetes mellitus with other skin ulcer: Secondary | ICD-10-CM | POA: Diagnosis not present

## 2020-08-23 DIAGNOSIS — Z792 Long term (current) use of antibiotics: Secondary | ICD-10-CM | POA: Diagnosis not present

## 2020-08-27 ENCOUNTER — Other Ambulatory Visit: Payer: Self-pay | Admitting: *Deleted

## 2020-08-27 ENCOUNTER — Telehealth: Payer: Self-pay

## 2020-08-27 MED ORDER — TAMSULOSIN HCL 0.4 MG PO CAPS: 0.4000 mg | ORAL_CAPSULE | Freq: Every day | ORAL | 0 refills | Status: DC

## 2020-08-27 NOTE — Telephone Encounter (Signed)
Message received today on nurse line voice mail from [place], pt wife for a medication refill. Attempted to call pt back. No answer.  After reviewing patient medication list, PCP has reordered patient tamsulosin according to Epic.

## 2020-09-02 ENCOUNTER — Other Ambulatory Visit: Payer: Self-pay

## 2020-09-02 DIAGNOSIS — I779 Disorder of arteries and arterioles, unspecified: Secondary | ICD-10-CM

## 2020-09-04 ENCOUNTER — Other Ambulatory Visit: Payer: Self-pay

## 2020-09-04 MED ORDER — TAMSULOSIN HCL 0.4 MG PO CAPS: 0.4000 mg | ORAL_CAPSULE | Freq: Every day | ORAL | 3 refills | Status: DC

## 2020-09-06 DIAGNOSIS — I10 Essential (primary) hypertension: Secondary | ICD-10-CM | POA: Diagnosis not present

## 2020-09-06 DIAGNOSIS — Z79890 Hormone replacement therapy (postmenopausal): Secondary | ICD-10-CM | POA: Diagnosis not present

## 2020-09-06 DIAGNOSIS — L97329 Non-pressure chronic ulcer of left ankle with unspecified severity: Secondary | ICD-10-CM | POA: Diagnosis not present

## 2020-09-06 DIAGNOSIS — I83023 Varicose veins of left lower extremity with ulcer of ankle: Secondary | ICD-10-CM | POA: Diagnosis not present

## 2020-09-06 DIAGNOSIS — Z7902 Long term (current) use of antithrombotics/antiplatelets: Secondary | ICD-10-CM | POA: Diagnosis not present

## 2020-09-06 DIAGNOSIS — E119 Type 2 diabetes mellitus without complications: Secondary | ICD-10-CM | POA: Diagnosis not present

## 2020-09-09 DIAGNOSIS — E785 Hyperlipidemia, unspecified: Secondary | ICD-10-CM | POA: Diagnosis not present

## 2020-09-09 DIAGNOSIS — Z7902 Long term (current) use of antithrombotics/antiplatelets: Secondary | ICD-10-CM | POA: Diagnosis not present

## 2020-09-09 DIAGNOSIS — E1151 Type 2 diabetes mellitus with diabetic peripheral angiopathy without gangrene: Secondary | ICD-10-CM | POA: Diagnosis not present

## 2020-09-09 DIAGNOSIS — I10 Essential (primary) hypertension: Secondary | ICD-10-CM | POA: Diagnosis not present

## 2020-09-09 DIAGNOSIS — Z794 Long term (current) use of insulin: Secondary | ICD-10-CM | POA: Diagnosis not present

## 2020-09-09 DIAGNOSIS — E039 Hypothyroidism, unspecified: Secondary | ICD-10-CM | POA: Diagnosis not present

## 2020-09-09 DIAGNOSIS — I83023 Varicose veins of left lower extremity with ulcer of ankle: Secondary | ICD-10-CM | POA: Diagnosis not present

## 2020-09-09 DIAGNOSIS — Z9181 History of falling: Secondary | ICD-10-CM | POA: Diagnosis not present

## 2020-09-09 DIAGNOSIS — L97321 Non-pressure chronic ulcer of left ankle limited to breakdown of skin: Secondary | ICD-10-CM | POA: Diagnosis not present

## 2020-09-09 DIAGNOSIS — Z4800 Encounter for change or removal of nonsurgical wound dressing: Secondary | ICD-10-CM | POA: Diagnosis not present

## 2020-09-11 DIAGNOSIS — Z4800 Encounter for change or removal of nonsurgical wound dressing: Secondary | ICD-10-CM | POA: Diagnosis not present

## 2020-09-11 DIAGNOSIS — E1151 Type 2 diabetes mellitus with diabetic peripheral angiopathy without gangrene: Secondary | ICD-10-CM | POA: Diagnosis not present

## 2020-09-11 DIAGNOSIS — I10 Essential (primary) hypertension: Secondary | ICD-10-CM | POA: Diagnosis not present

## 2020-09-11 DIAGNOSIS — L97321 Non-pressure chronic ulcer of left ankle limited to breakdown of skin: Secondary | ICD-10-CM | POA: Diagnosis not present

## 2020-09-11 DIAGNOSIS — I83023 Varicose veins of left lower extremity with ulcer of ankle: Secondary | ICD-10-CM | POA: Diagnosis not present

## 2020-09-11 DIAGNOSIS — E039 Hypothyroidism, unspecified: Secondary | ICD-10-CM | POA: Diagnosis not present

## 2020-09-13 DIAGNOSIS — I83023 Varicose veins of left lower extremity with ulcer of ankle: Secondary | ICD-10-CM | POA: Diagnosis not present

## 2020-09-13 DIAGNOSIS — E1151 Type 2 diabetes mellitus with diabetic peripheral angiopathy without gangrene: Secondary | ICD-10-CM | POA: Diagnosis not present

## 2020-09-13 DIAGNOSIS — Z4800 Encounter for change or removal of nonsurgical wound dressing: Secondary | ICD-10-CM | POA: Diagnosis not present

## 2020-09-13 DIAGNOSIS — E039 Hypothyroidism, unspecified: Secondary | ICD-10-CM | POA: Diagnosis not present

## 2020-09-13 DIAGNOSIS — L97321 Non-pressure chronic ulcer of left ankle limited to breakdown of skin: Secondary | ICD-10-CM | POA: Diagnosis not present

## 2020-09-13 DIAGNOSIS — I10 Essential (primary) hypertension: Secondary | ICD-10-CM | POA: Diagnosis not present

## 2020-09-16 DIAGNOSIS — Z4800 Encounter for change or removal of nonsurgical wound dressing: Secondary | ICD-10-CM | POA: Diagnosis not present

## 2020-09-16 DIAGNOSIS — I83023 Varicose veins of left lower extremity with ulcer of ankle: Secondary | ICD-10-CM | POA: Diagnosis not present

## 2020-09-16 DIAGNOSIS — L97321 Non-pressure chronic ulcer of left ankle limited to breakdown of skin: Secondary | ICD-10-CM | POA: Diagnosis not present

## 2020-09-16 DIAGNOSIS — E039 Hypothyroidism, unspecified: Secondary | ICD-10-CM | POA: Diagnosis not present

## 2020-09-16 DIAGNOSIS — I10 Essential (primary) hypertension: Secondary | ICD-10-CM | POA: Diagnosis not present

## 2020-09-16 DIAGNOSIS — E1151 Type 2 diabetes mellitus with diabetic peripheral angiopathy without gangrene: Secondary | ICD-10-CM | POA: Diagnosis not present

## 2020-09-18 DIAGNOSIS — I10 Essential (primary) hypertension: Secondary | ICD-10-CM | POA: Diagnosis not present

## 2020-09-18 DIAGNOSIS — E1151 Type 2 diabetes mellitus with diabetic peripheral angiopathy without gangrene: Secondary | ICD-10-CM | POA: Diagnosis not present

## 2020-09-18 DIAGNOSIS — Z4800 Encounter for change or removal of nonsurgical wound dressing: Secondary | ICD-10-CM | POA: Diagnosis not present

## 2020-09-18 DIAGNOSIS — E039 Hypothyroidism, unspecified: Secondary | ICD-10-CM | POA: Diagnosis not present

## 2020-09-18 DIAGNOSIS — L97321 Non-pressure chronic ulcer of left ankle limited to breakdown of skin: Secondary | ICD-10-CM | POA: Diagnosis not present

## 2020-09-18 DIAGNOSIS — I83023 Varicose veins of left lower extremity with ulcer of ankle: Secondary | ICD-10-CM | POA: Diagnosis not present

## 2020-09-20 DIAGNOSIS — M79604 Pain in right leg: Secondary | ICD-10-CM | POA: Diagnosis not present

## 2020-09-20 DIAGNOSIS — M79661 Pain in right lower leg: Secondary | ICD-10-CM | POA: Diagnosis not present

## 2020-09-20 DIAGNOSIS — M79605 Pain in left leg: Secondary | ICD-10-CM | POA: Diagnosis not present

## 2020-09-20 DIAGNOSIS — M79662 Pain in left lower leg: Secondary | ICD-10-CM | POA: Diagnosis not present

## 2020-09-23 DIAGNOSIS — I83023 Varicose veins of left lower extremity with ulcer of ankle: Secondary | ICD-10-CM | POA: Diagnosis not present

## 2020-09-23 DIAGNOSIS — M79605 Pain in left leg: Secondary | ICD-10-CM | POA: Diagnosis not present

## 2020-09-23 DIAGNOSIS — L97322 Non-pressure chronic ulcer of left ankle with fat layer exposed: Secondary | ICD-10-CM | POA: Diagnosis not present

## 2020-09-23 DIAGNOSIS — M79604 Pain in right leg: Secondary | ICD-10-CM | POA: Diagnosis not present

## 2020-09-23 DIAGNOSIS — R0989 Other specified symptoms and signs involving the circulatory and respiratory systems: Secondary | ICD-10-CM | POA: Diagnosis not present

## 2020-09-25 DIAGNOSIS — E1151 Type 2 diabetes mellitus with diabetic peripheral angiopathy without gangrene: Secondary | ICD-10-CM | POA: Diagnosis not present

## 2020-09-25 DIAGNOSIS — L97321 Non-pressure chronic ulcer of left ankle limited to breakdown of skin: Secondary | ICD-10-CM | POA: Diagnosis not present

## 2020-09-25 DIAGNOSIS — Z4800 Encounter for change or removal of nonsurgical wound dressing: Secondary | ICD-10-CM | POA: Diagnosis not present

## 2020-09-25 DIAGNOSIS — I83023 Varicose veins of left lower extremity with ulcer of ankle: Secondary | ICD-10-CM | POA: Diagnosis not present

## 2020-09-25 DIAGNOSIS — I10 Essential (primary) hypertension: Secondary | ICD-10-CM | POA: Diagnosis not present

## 2020-09-25 DIAGNOSIS — E039 Hypothyroidism, unspecified: Secondary | ICD-10-CM | POA: Diagnosis not present

## 2020-09-26 ENCOUNTER — Telehealth: Payer: Self-pay

## 2020-09-26 NOTE — Telephone Encounter (Signed)
Spoke with [employer]. They were calling to let patient know that one of the scripts on automatic refill will be refilling soon.

## 2020-09-26 NOTE — Telephone Encounter (Signed)
Patients spouse lvm stating the insurance called and needs the provider to contact them regarding medications. Spoke with her and she stated she doesn't know what medications it is for. Will call pharmacy to see which medications it is referring to.

## 2020-09-27 DIAGNOSIS — E11622 Type 2 diabetes mellitus with other skin ulcer: Secondary | ICD-10-CM | POA: Diagnosis not present

## 2020-09-27 DIAGNOSIS — E785 Hyperlipidemia, unspecified: Secondary | ICD-10-CM | POA: Diagnosis not present

## 2020-09-27 DIAGNOSIS — I872 Venous insufficiency (chronic) (peripheral): Secondary | ICD-10-CM | POA: Diagnosis not present

## 2020-09-27 DIAGNOSIS — Z880 Allergy status to penicillin: Secondary | ICD-10-CM | POA: Diagnosis not present

## 2020-09-27 DIAGNOSIS — L97329 Non-pressure chronic ulcer of left ankle with unspecified severity: Secondary | ICD-10-CM | POA: Diagnosis not present

## 2020-09-27 DIAGNOSIS — I83023 Varicose veins of left lower extremity with ulcer of ankle: Secondary | ICD-10-CM | POA: Diagnosis not present

## 2020-09-27 DIAGNOSIS — E039 Hypothyroidism, unspecified: Secondary | ICD-10-CM | POA: Diagnosis not present

## 2020-09-27 DIAGNOSIS — Z79890 Hormone replacement therapy (postmenopausal): Secondary | ICD-10-CM | POA: Diagnosis not present

## 2020-09-27 DIAGNOSIS — Z7902 Long term (current) use of antithrombotics/antiplatelets: Secondary | ICD-10-CM | POA: Diagnosis not present

## 2020-09-27 DIAGNOSIS — I878 Other specified disorders of veins: Secondary | ICD-10-CM | POA: Diagnosis not present

## 2020-09-27 DIAGNOSIS — I10 Essential (primary) hypertension: Secondary | ICD-10-CM | POA: Diagnosis not present

## 2020-09-27 DIAGNOSIS — I70243 Atherosclerosis of native arteries of left leg with ulceration of ankle: Secondary | ICD-10-CM | POA: Diagnosis not present

## 2020-09-30 DIAGNOSIS — I83023 Varicose veins of left lower extremity with ulcer of ankle: Secondary | ICD-10-CM | POA: Diagnosis not present

## 2020-09-30 DIAGNOSIS — Z4800 Encounter for change or removal of nonsurgical wound dressing: Secondary | ICD-10-CM | POA: Diagnosis not present

## 2020-09-30 DIAGNOSIS — L97321 Non-pressure chronic ulcer of left ankle limited to breakdown of skin: Secondary | ICD-10-CM | POA: Diagnosis not present

## 2020-09-30 DIAGNOSIS — E039 Hypothyroidism, unspecified: Secondary | ICD-10-CM | POA: Diagnosis not present

## 2020-09-30 DIAGNOSIS — I10 Essential (primary) hypertension: Secondary | ICD-10-CM | POA: Diagnosis not present

## 2020-09-30 DIAGNOSIS — E1151 Type 2 diabetes mellitus with diabetic peripheral angiopathy without gangrene: Secondary | ICD-10-CM | POA: Diagnosis not present

## 2020-10-01 ENCOUNTER — Other Ambulatory Visit: Payer: Self-pay

## 2020-10-01 ENCOUNTER — Telehealth (INDEPENDENT_AMBULATORY_CARE_PROVIDER_SITE_OTHER): Payer: Medicare Other

## 2020-10-01 VITALS — BP 120/82 | HR 79 | Ht 68.0 in | Wt 171.0 lb

## 2020-10-01 DIAGNOSIS — E782 Mixed hyperlipidemia: Secondary | ICD-10-CM

## 2020-10-01 DIAGNOSIS — Z794 Long term (current) use of insulin: Secondary | ICD-10-CM | POA: Diagnosis not present

## 2020-10-01 DIAGNOSIS — Z0000 Encounter for general adult medical examination without abnormal findings: Secondary | ICD-10-CM

## 2020-10-01 DIAGNOSIS — E1165 Type 2 diabetes mellitus with hyperglycemia: Secondary | ICD-10-CM | POA: Diagnosis not present

## 2020-10-01 DIAGNOSIS — E1122 Type 2 diabetes mellitus with diabetic chronic kidney disease: Secondary | ICD-10-CM

## 2020-10-01 DIAGNOSIS — N184 Chronic kidney disease, stage 4 (severe): Secondary | ICD-10-CM | POA: Diagnosis not present

## 2020-10-01 DIAGNOSIS — IMO0002 Reserved for concepts with insufficient information to code with codable children: Secondary | ICD-10-CM

## 2020-10-01 NOTE — Patient Instructions (Signed)
Chase [name] , Thank you for taking time to come for your Medicare Wellness Visit. I appreciate your ongoing commitment to your health goals. Please review the following plan we discussed and let me know if I can assist you in the future.   Screening recommendations/referrals: Colonoscopy: 07/21/2023 Recommended yearly ophthalmology/optometry visit for glaucoma screening and checkup Recommended yearly dental visit for hygiene and checkup  Vaccinations: Influenza vaccine: DUE : Scheduled for November 1st, 2021 @ 1:00pm (call when you get here)  Pneumococcal vaccine: Complete  Tdap vaccine: DUE: 10/06/2021  Shingles vaccine: Education provided with AVS.     Advanced directives: N/A   Conditions/risks identified: Sore on leg prevents mobility and causes pain.   Next appointment: October 07, 2020 @ 1:00 for FLU SHOT;   Preventive Care 42 Years and Older, Male Preventive care refers to lifestyle choices and visits with your health care provider that can promote health and wellness. What does preventive care include?  A yearly physical exam. This is also called an annual well check.  Dental exams once or twice a year.  Routine eye exams. Ask your health care provider how often you should have your eyes checked.  Personal lifestyle choices, including:  Daily care of your teeth and gums.  Regular physical activity.  Eating a healthy diet.  Avoiding tobacco and drug use.  Limiting alcohol use.  Practicing safe sex.  Taking low doses of aspirin every day.  Taking vitamin and mineral supplements as recommended by your health care provider. What happens during an annual well check? The services and screenings done by your health care provider during your annual well check will depend on your age, overall health, lifestyle risk factors, and family history of disease. Counseling  Your health care provider may ask you questions about your:  Alcohol use.  Tobacco use.  Drug  use.  Emotional well-being.  Home and relationship well-being.  Sexual activity.  Eating habits.  History of falls.  Memory and ability to understand (cognition).  Work and work Statistician. Screening  You may have the following tests or measurements:  Height, weight, and BMI.  Blood pressure.  Lipid and cholesterol levels. These may be checked every 5 years, or more frequently if you are over 34 years old.  Skin check.  Lung cancer screening. You may have this screening every year starting at age 14 if you have a 30-pack-year history of smoking and currently smoke or have quit within the past 15 years.  Fecal occult blood test (FOBT) of the stool. You may have this test every year starting at age 99.  Flexible sigmoidoscopy or colonoscopy. You may have a sigmoidoscopy every 5 years or a colonoscopy every 10 years starting at age 54.  Prostate cancer screening. Recommendations will vary depending on your family history and other risks.  Hepatitis C blood test.  Hepatitis B blood test.  Sexually transmitted disease (STD) testing.  Diabetes screening. This is done by checking your blood sugar (glucose) after you have not eaten for a while (fasting). You may have this done every 1-3 years.  Abdominal aortic aneurysm (AAA) screening. You may need this if you are a current or former smoker.  Osteoporosis. You may be screened starting at age 69 if you are at high risk. Talk with your health care provider about your test results, treatment options, and if necessary, the need for more tests. Vaccines  Your health care provider may recommend certain vaccines, such as:  Influenza vaccine. This is recommended  every year.  Tetanus, diphtheria, and acellular pertussis (Tdap, Td) vaccine. You may need a Td booster every 10 years.  Zoster vaccine. You may need this after age 21.  Pneumococcal 13-valent conjugate (PCV13) vaccine. One dose is recommended after age  23.  Pneumococcal polysaccharide (PPSV23) vaccine. One dose is recommended after age 30. Talk to your health care provider about which screenings and vaccines you need and how often you need them. This information is not intended to replace advice given to you by your health care provider. Make sure you discuss any questions you have with your health care provider. Document Released: 12/20/2015 Document Revised: 08/12/2016 Document Reviewed: 09/24/2015 Elsevier Interactive Patient Education  2017 [place] Prevention in the Home Falls can cause injuries. They can happen to people of all ages. There are many things you can do to make your home safe and to help prevent falls. What can I do on the outside of my home?  Regularly fix the edges of walkways and driveways and fix any cracks.  Remove anything that might make you trip as you walk through a door, such as a raised step or threshold.  Trim any bushes or trees on the path to your home.  Use bright outdoor lighting.  Clear any walking paths of anything that might make someone trip, such as rocks or tools.  Regularly check to see if handrails are loose or broken. Make sure that both sides of any steps have handrails.  Any raised decks and porches should have guardrails on the edges.  Have any leaves, snow, or ice cleared regularly.  Use sand or salt on walking paths during winter.  Clean up any spills in your garage right away. This includes oil or grease spills. What can I do in the bathroom?  Use night lights.  Install grab bars by the toilet and in the tub and shower. Do not use towel bars as grab bars.  Use non-skid mats or decals in the tub or shower.  If you need to sit down in the shower, use a plastic, non-slip stool.  Keep the floor dry. Clean up any water that spills on the floor as soon as it happens.  Remove soap buildup in the tub or shower regularly.  Attach bath mats securely with double-sided  non-slip rug tape.  Do not have throw rugs and other things on the floor that can make you trip. What can I do in the bedroom?  Use night lights.  Make sure that you have a light by your bed that is easy to reach.  Do not use any sheets or blankets that are too big for your bed. They should not hang down onto the floor.  Have a firm chair that has side arms. You can use this for support while you get dressed.  Do not have throw rugs and other things on the floor that can make you trip. What can I do in the kitchen?  Clean up any spills right away.  Avoid walking on wet floors.  Keep items that you use a lot in easy-to-reach places.  If you need to reach something above you, use a strong step stool that has a grab bar.  Keep electrical cords out of the way.  Do not use floor polish or wax that makes floors slippery. If you must use wax, use non-skid floor wax.  Do not have throw rugs and other things on the floor that can make you trip. What  can I do with my stairs?  Do not leave any items on the stairs.  Make sure that there are handrails on both sides of the stairs and use them. Fix handrails that are broken or loose. Make sure that handrails are as long as the stairways.  Check any carpeting to make sure that it is firmly attached to the stairs. Fix any carpet that is loose or worn.  Avoid having throw rugs at the top or bottom of the stairs. If you do have throw rugs, attach them to the floor with carpet tape.  Make sure that you have a light switch at the top of the stairs and the bottom of the stairs. If you do not have them, ask someone to add them for you. What else can I do to help prevent falls?  Wear shoes that:  Do not have high heels.  Have rubber bottoms.  Are comfortable and fit you well.  Are closed at the toe. Do not wear sandals.  If you use a stepladder:  Make sure that it is fully opened. Do not climb a closed stepladder.  Make sure that both  sides of the stepladder are locked into place.  Ask someone to hold it for you, if possible.  Clearly mark and make sure that you can see:  Any grab bars or handrails.  First and last steps.  Where the edge of each step is.  Use tools that help you move around (mobility aids) if they are needed. These include:  Canes.  Walkers.  Scooters.  Crutches.  Turn on the lights when you go into a dark area. Replace any light bulbs as soon as they burn out.  Set up your furniture so you have a clear path. Avoid moving your furniture around.  If any of your floors are uneven, fix them.  If there are any pets around you, be aware of where they are.  Review your medicines with your doctor. Some medicines can make you feel dizzy. This can increase your chance of falling. Ask your doctor what other things that you can do to help prevent falls. This information is not intended to replace advice given to you by your health care provider. Make sure you discuss any questions you have with your health care provider. Document Released: 09/19/2009 Document Revised: 04/30/2016 Document Reviewed: 12/28/2014 Elsevier Interactive Patient Education  2017 [employer].

## 2020-10-01 NOTE — Progress Notes (Addendum)
Subjective:   Chase [name] is a 86 y.o. male who presents for Medicare Annual/Subsequent preventive examination.        Objective:    There were no vitals filed for this visit. There is no height or weight on file to calculate BMI.  Advanced Directives 09/26/2018 09/14/2018 05/27/2018 05/23/2018 02/26/2018 10/06/2017 05/07/2017  Does Patient Have a Medical Advance Directive? No No No No No No No  Would patient like information on creating a medical advance directive? Yes (ED - Information included in AVS) - No - Patient declined Yes (MAU/Ambulatory/Procedural Areas - Information given) No - Patient declined Yes (MAU/Ambulatory/Procedural Areas - Information given) No - Patient declined  Pre-existing out of facility DNR order (yellow form or pink MOST form) - - - - - - -    Current Medications (verified) Outpatient Encounter Medications as of 10/01/2020  Medication Sig  . amLODipine (NORVASC) 10 MG tablet TAKE 1 TABLET DAILY  . calcium carbonate (TUMS - DOSED IN MG ELEMENTAL CALCIUM) 500 MG chewable tablet Chew 1 tablet by mouth 2 (two) times daily. For Bone Health  . clopidogrel (PLAVIX) 75 MG tablet TAKE 1 TABLET DAILY  . diphenoxylate-atropine (LOMOTIL) 2.5-0.025 MG tablet Take one tablet by mouth once daily for loose watery stool  . HUMULIN 70/30 KWIKPEN (70-30) 100 UNIT/ML KwikPen INJECT 10 UNITS UNDER THE SKIN TWICE A DAY  . Insulin Pen Needle (B-D ULTRAFINE III SHORT PEN) 31G X 8 MM MISC 1 each by Does not apply route as directed.  . levETIRAcetam (KEPPRA) 500 MG tablet Take 250 mg by mouth at bedtime.   Marland Kitchen levothyroxine (SYNTHROID) 112 MCG tablet TAKE 1 TABLET DAILY BEFORE BREAKFAST  . Multiple Vitamins-Minerals (MULTIVITAMIN WITH MINERALS) tablet Take 1 tablet by mouth daily.  Marland Kitchen olopatadine (PATANOL) 0.1 % ophthalmic solution INSTILL 1 DROP INTO EACH EYE TWICE DAILY  . OneTouch Delica Lancets 16W MISC 1 each by Other route 2 (two) times daily.  Glory Rosebush ULTRA test strip USE 1  STRIP TWICE A DAY AS INSTRUCTED  . polyethylene glycol powder (GLYCOLAX/MIRALAX) 17 GM/SCOOP powder Take 17 g by mouth daily.  . pravastatin (PRAVACHOL) 20 MG tablet TAKE 1 TABLET DAILY  . tamsulosin (FLOMAX) 0.4 MG CAPS capsule Take 1 capsule (0.4 mg total) by mouth daily.  . Vitamin D, Ergocalciferol, (DRISDOL) 1.25 MG (50000 UNIT) CAPS capsule Take 50,000 Units by mouth once a week.   No facility-administered encounter medications on file as of 10/01/2020.    Allergies (verified) Bayer advanced aspirin [aspirin] and Penicillins   History: Past Medical History:  Diagnosis Date  . Bradycardia 03/15/2012  . Carotid artery occlusion   . Choledocholithiasis 02/25/2018  . CKD (chronic kidney disease) stage 4, GFR 15-29 ml/min (HCC)   . Complete lesion of cervical spinal cord ([place]) 03/14/3012   Stable since 2006  . CVA (cerebrovascular accident) ([place]) 09/07/12   right sided weakness  . Depressive disorder, not elsewhere classified   . Diabetes mellitus approx 1994  . Diabetic neuropathy ([place])   . History of kidney stones   . Hypertensive heart disease   . Hypothyroidism approx 2000  . Lacunar stroke, acute ([place]) 03/14/2012  . Obesity   . Osteoarthrosis, unspecified whether generalized or localized, lower leg   . Other and unspecified hyperlipidemia   . Peripheral vascular disease, unspecified ([place])   . Seizures ([place])    unknown etiology; on meds, last seizure was 2015  . Spinal stenosis, unspecified region other than cervical   .  Spondylosis of unspecified site without mention of myelopathy    Past Surgical History:  Procedure Laterality Date  . BILIARY STENT PLACEMENT N/A 05/27/2018   Procedure: BILIARY STENT PLACEMENT;  Surgeon: Rogene Houston, MD;  [facility];  Service: Endoscopy;  Laterality: N/A;  . COLONOSCOPY N/A 08/10/2013   Procedure: COLONOSCOPY;  Surgeon: Rogene Houston, MD;  [facility];  Service: Endoscopy;  Laterality: N/A;  240  . ERCP N/A  03/02/2018   Procedure: ENDOSCOPIC RETROGRADE CHOLANGIOPANCREATOGRAPHY (ERCP) With sphincterotomy and stent placement;  Surgeon: Rogene Houston, MD;  [facility];  Service: Gastroenterology;  Laterality: N/A;  . ERCP N/A 05/27/2018   Procedure: ENDOSCOPIC RETROGRADE CHOLANGIOPANCREATOGRAPHY (ERCP);  Surgeon: Rogene Houston, MD;  [facility];  Service: Endoscopy;  Laterality: N/A;  . ERCP N/A 09/16/2018   Procedure: ENDOSCOPIC RETROGRADE CHOLANGIOPANCREATOGRAPHY (ERCP);  Surgeon: Rogene Houston, MD;  [facility];  Service: Endoscopy;  Laterality: N/A;  . GASTROINTESTINAL STENT REMOVAL N/A 05/27/2018   Procedure: Biliary STENT REMOVAL;  Surgeon: Rogene Houston, MD;  [facility];  Service: Endoscopy;  Laterality: N/A;  . GASTROINTESTINAL STENT REMOVAL N/A 09/16/2018   Procedure: GASTROINTESTINAL STENT REMOVAL;  Surgeon: Rogene Houston, MD;  [facility];  Service: Endoscopy;  Laterality: N/A;  . kidney stones left x2  1975  . KIDNEY SURGERY     Ruptured left kidney 30 yrs ago  from a kidney stone  . LITHOTRIPSY N/A 05/27/2018   Procedure: MECHANICAL LITHOTRIPSY;  Surgeon: Rogene Houston, MD;  [facility];  Service: Endoscopy;  Laterality: N/A;  . REMOVAL OF STONES N/A 09/16/2018   Procedure: REMOVAL OF MULTIPLE STONES WITH BASKET AND BALLOON;  Surgeon: Rogene Houston, MD;  [facility];  Service: Endoscopy;  Laterality: N/A;  . SPHINCTEROTOMY N/A 05/27/2018   Procedure: SPHINCTEROTOMY extended;  Surgeon: Rogene Houston, MD;  [facility];  Service: Endoscopy;  Laterality: N/A;  . SPYGLASS CHOLANGIOSCOPY N/A 05/27/2018   Procedure: YKDXIPJA CHOLANGIOSCOPY;  Surgeon: Rogene Houston, MD;  [facility];  Service: Endoscopy;  Laterality: N/A;   Family History  Problem Relation Age of Onset  . Diabetes Mother   . Prostate cancer Father   . Hypertension Brother   . Hypertension Brother    . Diabetes Brother   . Stroke Brother   . Diabetes Brother   . Diabetes Daughter   . Diabetes Daughter    Social History   Socioeconomic History  . Marital status: Married    Spouse name: Not on file  . Number of children: 5  . Years of education: 8  . Highest education level: 8th grade  Occupational History  . Occupation: retired     Fish farm manager: RETIRED  Tobacco Use  . Smoking status: Never Smoker  . Smokeless tobacco: Never Used  Vaping Use  . Vaping Use: Never used  Substance and Sexual Activity  . Alcohol use: No    Alcohol/week: 0.0 standard drinks  . Drug use: No  . Sexual activity: Not Currently  Other Topics Concern  . Not on file  Social History Narrative  . Not on file   Social Determinants of Health   Financial Resource Strain:   . Difficulty of Paying Living Expenses: Not on file  Food Insecurity:   . Worried About Charity fundraiser in the Last Year: Not on file  . Ran Out of Food  in the Last Year: Not on file  Transportation Needs:   . Lack of Transportation (Medical): Not on file  . Lack of Transportation (Non-Medical): Not on file  Physical Activity:   . Days of Exercise per Week: Not on file  . Minutes of Exercise per Session: Not on file  Stress:   . Feeling of Stress : Not on file  Social Connections:   . Frequency of Communication with Friends and Family: Not on file  . Frequency of Social Gatherings with Friends and Family: Not on file  . Attends Religious Services: Not on file  . Active Member of Clubs or Organizations: Not on file  . Attends Archivist Meetings: Not on file  . Marital Status: Not on file    Tobacco Counseling Counseling given: Not Answered                  Diabetic? YES. CBG done this morning 107.          Activities of Daily Living No flowsheet data found.  Patient Care Team: [name], MD as PCP - General Fran Lowes, MD (Inactive) as Consulting Physician  (Nephrology) Phillips Odor, MD as Consulting Physician (Neurology) Cassandria Anger, MD as Consulting Physician (Endocrinology) Georganna Skeans, MD as Consulting Physician (General Surgery)  Indicate any recent Medical Services you may have received from other than [facility] providers in the past year (date may be approximate).     Assessment:   This is a routine wellness examination for Chase [name].  Hearing/Vision screen No exam data present  Dietary issues and exercise activities discussed:    Goals    . DIET - INCREASE LEAN PROTEINS    . Exercise 3x per week (30 min per time)     Recommend starting a routine exercise program at least 3 days a week for 30-45 minutes at a time as tolerated.     . Increase physical activity    . Patient Stated     I want to eat more and move around more      Depression Screen PHQ 2/9 Scores 09/28/2019 07/03/2019 12/20/2018 09/26/2018 08/17/2018 06/07/2018 02/16/2018  PHQ - 2 Score 0 0 0 3 0 0 0  PHQ- 9 Score - - - 4 - - -    Fall Risk Fall Risk  06/28/2020 10/17/2019 09/28/2019 07/03/2019 12/20/2018  Falls in the past year? 0 0 0 1 0  Number falls in past yr: 0 0 0 1 0  Injury with Fall? 0 0 0 1 0  Risk for fall due to : History of fall(s);Impaired balance/gait;Impaired mobility Impaired balance/gait;Impaired mobility;Impaired vision - - -  Follow up Falls evaluation completed - - - -    Any stairs in or around the home? No  If so, are there any without handrails? No  Home free of loose throw rugs in walkways, pet beds, electrical cords, etc? Yes  Adequate lighting in your home to reduce risk of falls? Yes   ASSISTIVE DEVICES UTILIZED TO PREVENT FALLS:  Life alert? No  Use of a cane, walker or w/c? Yes  Grab bars in the bathroom? Yes  Shower chair or bench in shower? Yes  Elevated toilet seat or a handicapped toilet? Yes   TIMED UP AND GO:  Was the test performed? No .     Cognitive Function: MMSE - Mini Mental State Exam 10/06/2017    Orientation to time 1  Orientation to Place 5  Registration 3  Attention/  Calculation 4  Recall 1  Language- name 2 objects 2  Language- repeat 0  Language- follow 3 step command 3  Language- read & follow direction 1  Write a sentence 1  Copy design 1  Total score 22     6CIT Screen 09/28/2019 09/26/2018  What Year? 0 points 4 points  What month? 0 points 0 points  What time? 0 points 3 points  Count back from 20 0 points 0 points  Months in reverse 0 points 4 points  Repeat phrase 0 points 2 points  Total Score 0 13    Immunizations Immunization History  Administered Date(s) Administered  . Fluad Quad(high Dose 65+) 08/07/2019  . H1N1 11/14/2008  . Influenza Split 10/07/2011, 09/08/2012  . Influenza Whole 08/22/2007, 08/27/2010  . Influenza,inj,Quad PF,6+ Mos 08/22/2013, 09/26/2014, 09/26/2015, 09/01/2016, 09/20/2017, 09/26/2018  . Moderna SARS-COVID-2 Vaccination 01/01/2020, 02/01/2020  . Pneumococcal Conjugate-13 06/12/2014  . Pneumococcal Polysaccharide-23 05/21/2004  . Td 05/21/2004  . Tdap 10/07/2011    TDAP status: Up to date Flu Vaccine status: Up to date Pneumococcal vaccine status: Up to date Covid-19 vaccine status: Completed vaccines  Qualifies for Shingles Vaccine? No   Zostavax completed No   Shingrix Completed?: No.    Education has been provided regarding the importance of this vaccine. Patient has been advised to call insurance company to determine out of pocket expense if they have not yet received this vaccine. Advised may also receive vaccine at local pharmacy or Health Dept. Verbalized acceptance and understanding.  Screening Tests Health Maintenance  Topic Date Due  . OPHTHALMOLOGY EXAM  05/06/2018  . URINE MICROALBUMIN  12/22/2019  . INFLUENZA VACCINE  07/07/2020  . HEMOGLOBIN A1C  12/29/2020  . FOOT EXAM  06/28/2021  . TETANUS/TDAP  10/06/2021  . COVID-19 Vaccine  Completed  . PNA vac Low Risk Adult  Completed    Health  Maintenance  Health Maintenance Due  Topic Date Due  . OPHTHALMOLOGY EXAM  05/06/2018  . URINE MICROALBUMIN  12/22/2019  . INFLUENZA VACCINE  07/07/2020    Colorectal cancer screening: Completed normal. Repeat every 10 years  Lung Cancer Screening: (Low Dose CT Chest recommended if Age 84-80 years, 30 pack-year currently smoking OR have quit w/in 15years.) does not qualify.    Additional Screening:  Hepatitis C Screening: does qualify; Completed   Vision Screening: Recommended annual ophthalmology exams for early detection of glaucoma and other disorders of the eye.  Is the patient up to date with their annual eye exam?  Yes    Who is the provider or what is the name of the office in which the patient attends annual eye exams? Dr. Rosana Hoes  If pt is not established with a provider, would they like to be referred to a provider to establish care? No .   Dental Screening: Recommended annual dental exams for proper oral hygiene  Community Resource Referral / Chronic Care Management: CRR required this visit?  No   CCM required this visit?  No      Plan:     I have personally reviewed and noted the following in the patient's chart:   . Medical and social history . Use of alcohol, tobacco or illicit drugs  . Current medications and supplements . Functional ability and status . Nutritional status . Physical activity . Advanced directives . List of other physicians . Hospitalizations, surgeries, and ER visits in previous 12 months . Vitals . Screenings to include cognitive, depression, and falls . Referrals and  appointments  In addition, I have reviewed and discussed with patient certain preventive protocols, quality metrics, and best practice recommendations. A written personalized care plan for preventive services as well as general preventive health recommendations were provided to patient.     Chase [place], LPN   [id number]   Nurse Notes: AWV conducted over the  phone with pt consent of televisit via audio. Pt located in the home, and provider in the office. Pt wife was present and helped with transmission of information and health maintenance care gaps were discussed and pt verbalized understanding. Appointment took approx. 7min.

## 2020-10-02 DIAGNOSIS — E1151 Type 2 diabetes mellitus with diabetic peripheral angiopathy without gangrene: Secondary | ICD-10-CM | POA: Diagnosis not present

## 2020-10-02 DIAGNOSIS — E039 Hypothyroidism, unspecified: Secondary | ICD-10-CM | POA: Diagnosis not present

## 2020-10-02 DIAGNOSIS — L97321 Non-pressure chronic ulcer of left ankle limited to breakdown of skin: Secondary | ICD-10-CM | POA: Diagnosis not present

## 2020-10-02 DIAGNOSIS — Z4800 Encounter for change or removal of nonsurgical wound dressing: Secondary | ICD-10-CM | POA: Diagnosis not present

## 2020-10-02 DIAGNOSIS — I83023 Varicose veins of left lower extremity with ulcer of ankle: Secondary | ICD-10-CM | POA: Diagnosis not present

## 2020-10-02 DIAGNOSIS — I10 Essential (primary) hypertension: Secondary | ICD-10-CM | POA: Diagnosis not present

## 2020-10-02 LAB — COMPREHENSIVE METABOLIC PANEL
AG Ratio: 1.1 (calc) (ref 1.0–2.5)
ALT: 11 U/L (ref 9–46)
AST: 18 U/L (ref 10–35)
Albumin: 3.4 g/dL — ABNORMAL LOW (ref 3.6–5.1)
Alkaline phosphatase (APISO): 77 U/L (ref 35–144)
BUN/Creatinine Ratio: 15 (calc) (ref 6–22)
BUN: 47 mg/dL — ABNORMAL HIGH (ref 7–25)
CO2: 22 mmol/L (ref 20–32)
Calcium: 9.3 mg/dL (ref 8.6–10.3)
Chloride: 110 mmol/L (ref 98–110)
Creat: 3.15 mg/dL — ABNORMAL HIGH (ref 0.70–1.11)
Globulin: 3.2 g/dL (calc) (ref 1.9–3.7)
Glucose, Bld: 183 mg/dL — ABNORMAL HIGH (ref 65–99)
Potassium: 4.8 mmol/L (ref 3.5–5.3)
Sodium: 143 mmol/L (ref 135–146)
Total Bilirubin: 0.4 mg/dL (ref 0.2–1.2)
Total Protein: 6.6 g/dL (ref 6.1–8.1)

## 2020-10-02 LAB — LIPID PANEL
Cholesterol: 165 mg/dL (ref <200)
HDL: 78 mg/dL (ref >=40)
LDL Cholesterol (Calc): 75 mg/dL (calc)
Non-HDL Cholesterol (Calc): 87 mg/dL (calc) (ref <130)
Total CHOL/HDL Ratio: 2.1 (calc) (ref <5.0)
Triglycerides: 42 mg/dL (ref <150)

## 2020-10-03 ENCOUNTER — Other Ambulatory Visit: Payer: Self-pay

## 2020-10-03 ENCOUNTER — Encounter: Payer: Self-pay | Admitting: Nurse Practitioner

## 2020-10-03 ENCOUNTER — Ambulatory Visit (INDEPENDENT_AMBULATORY_CARE_PROVIDER_SITE_OTHER): Payer: Medicare Other | Admitting: Nurse Practitioner

## 2020-10-03 VITALS — BP 158/77 | HR 66 | Ht 68.0 in | Wt 177.0 lb

## 2020-10-03 DIAGNOSIS — E1122 Type 2 diabetes mellitus with diabetic chronic kidney disease: Secondary | ICD-10-CM

## 2020-10-03 DIAGNOSIS — N184 Chronic kidney disease, stage 4 (severe): Secondary | ICD-10-CM

## 2020-10-03 DIAGNOSIS — Z794 Long term (current) use of insulin: Secondary | ICD-10-CM

## 2020-10-03 DIAGNOSIS — I10 Essential (primary) hypertension: Secondary | ICD-10-CM | POA: Diagnosis not present

## 2020-10-03 DIAGNOSIS — E1165 Type 2 diabetes mellitus with hyperglycemia: Secondary | ICD-10-CM

## 2020-10-03 DIAGNOSIS — E038 Other specified hypothyroidism: Secondary | ICD-10-CM

## 2020-10-03 DIAGNOSIS — E782 Mixed hyperlipidemia: Secondary | ICD-10-CM

## 2020-10-03 DIAGNOSIS — IMO0002 Reserved for concepts with insufficient information to code with codable children: Secondary | ICD-10-CM

## 2020-10-03 LAB — POCT GLYCOSYLATED HEMOGLOBIN (HGB A1C): Hemoglobin A1C: 6.3 % — AB (ref 4.0–5.6)

## 2020-10-03 NOTE — Progress Notes (Signed)
10/03/2020           Endocrinology follow-up note    Subjective:    Patient ID: Chase [name], male    DOB: 1931/10/24, PCP [name], MD   Past Medical History:  Diagnosis Date  . Bradycardia 03/15/2012  . Carotid artery occlusion   . Choledocholithiasis 02/25/2018  . CKD (chronic kidney disease) stage 4, GFR 15-29 ml/min (HCC)   . Complete lesion of cervical spinal cord ([place]) 03/14/3012   Stable since 2006  . CVA (cerebrovascular accident) ([place]) 09/07/12   right sided weakness  . Depressive disorder, not elsewhere classified   . Diabetes mellitus approx 1994  . Diabetic neuropathy ([place])   . History of kidney stones   . Hypertensive heart disease   . Hypothyroidism approx 2000  . Lacunar stroke, acute ([place]) 03/14/2012  . Obesity   . Osteoarthrosis, unspecified whether generalized or localized, lower leg   . Other and unspecified hyperlipidemia   . Peripheral vascular disease, unspecified ([place])   . Seizures ([place])    unknown etiology; on meds, last seizure was 2015  . Spinal stenosis, unspecified region other than cervical   . Spondylosis of unspecified site without mention of myelopathy    Past Surgical History:  Procedure Laterality Date  . BILIARY STENT PLACEMENT N/A 05/27/2018   Procedure: BILIARY STENT PLACEMENT;  Surgeon: Rogene Houston, MD;  [facility];  Service: Endoscopy;  Laterality: N/A;  . COLONOSCOPY N/A 08/10/2013   Procedure: COLONOSCOPY;  Surgeon: Rogene Houston, MD;  [facility];  Service: Endoscopy;  Laterality: N/A;  240  . ERCP N/A 03/02/2018   Procedure: ENDOSCOPIC RETROGRADE CHOLANGIOPANCREATOGRAPHY (ERCP) With sphincterotomy and stent placement;  Surgeon: Rogene Houston, MD;  [facility];  Service: Gastroenterology;  Laterality: N/A;  . ERCP N/A 05/27/2018   Procedure: ENDOSCOPIC RETROGRADE CHOLANGIOPANCREATOGRAPHY (ERCP);  Surgeon: Rogene Houston, MD;  [facility];  Service: Endoscopy;   Laterality: N/A;  . ERCP N/A 09/16/2018   Procedure: ENDOSCOPIC RETROGRADE CHOLANGIOPANCREATOGRAPHY (ERCP);  Surgeon: Rogene Houston, MD;  [facility];  Service: Endoscopy;  Laterality: N/A;  . GASTROINTESTINAL STENT REMOVAL N/A 05/27/2018   Procedure: Biliary STENT REMOVAL;  Surgeon: Rogene Houston, MD;  [facility];  Service: Endoscopy;  Laterality: N/A;  . GASTROINTESTINAL STENT REMOVAL N/A 09/16/2018   Procedure: GASTROINTESTINAL STENT REMOVAL;  Surgeon: Rogene Houston, MD;  [facility];  Service: Endoscopy;  Laterality: N/A;  . kidney stones left x2  1975  . KIDNEY SURGERY     Ruptured left kidney 30 yrs ago  from a kidney stone  . LITHOTRIPSY N/A 05/27/2018   Procedure: MECHANICAL LITHOTRIPSY;  Surgeon: Rogene Houston, MD;  [facility];  Service: Endoscopy;  Laterality: N/A;  . REMOVAL OF STONES N/A 09/16/2018   Procedure: REMOVAL OF MULTIPLE STONES WITH BASKET AND BALLOON;  Surgeon: Rogene Houston, MD;  [facility];  Service: Endoscopy;  Laterality: N/A;  . SPHINCTEROTOMY N/A 05/27/2018   Procedure: SPHINCTEROTOMY extended;  Surgeon: Rogene Houston, MD;  [facility];  Service: Endoscopy;  Laterality: N/A;  . SPYGLASS CHOLANGIOSCOPY N/A 05/27/2018   Procedure: NUUVOZDG CHOLANGIOSCOPY;  Surgeon: Rogene Houston, MD;  [facility];  Service: Endoscopy;  Laterality: N/A;   Social History   Socioeconomic History  . Marital status: Married    Spouse name: Not on file  . Number  of children: 5  . Years of education: 8  . Highest education level: 8th grade  Occupational History  . Occupation: retired     Fish farm manager: RETIRED  Tobacco Use  . Smoking status: Never Smoker  . Smokeless tobacco: Never Used  Vaping Use  . Vaping Use: Never used  Substance and Sexual Activity  . Alcohol use: No    Alcohol/week: 0.0 standard drinks  . Drug use: No  . Sexual activity: Not Currently  Other Topics  Concern  . Not on file  Social History Narrative  . Not on file   Social Determinants of Health   Financial Resource Strain: Low Risk   . Difficulty of Paying Living Expenses: Not very hard  Food Insecurity: No Food Insecurity  . Worried About Charity fundraiser in the Last Year: Never true  . Ran Out of Food in the Last Year: Never true  Transportation Needs: No Transportation Needs  . Lack of Transportation (Medical): No  . Lack of Transportation (Non-Medical): No  Physical Activity: Inactive  . Days of Exercise per Week: 0 days  . Minutes of Exercise per Session: 0 min  Stress: No Stress Concern Present  . Feeling of Stress : Not at all  Social Connections: Moderately Integrated  . Frequency of Communication with Friends and Family: Twice a week  . Frequency of Social Gatherings with Friends and Family: Three times a week  . Attends Religious Services: 1 to 4 times per year  . Active Member of Clubs or Organizations: No  . Attends Archivist Meetings: Never  . Marital Status: Married   Outpatient Encounter Medications as of 10/03/2020  Medication Sig  . amLODipine (NORVASC) 10 MG tablet TAKE 1 TABLET DAILY  . calcium carbonate (TUMS - DOSED IN MG ELEMENTAL CALCIUM) 500 MG chewable tablet Chew 1 tablet by mouth 2 (two) times daily. For Bone Health  . clopidogrel (PLAVIX) 75 MG tablet TAKE 1 TABLET DAILY  . diphenoxylate-atropine (LOMOTIL) 2.5-0.025 MG tablet Take one tablet by mouth once daily for loose watery stool  . HUMULIN 70/30 KWIKPEN (70-30) 100 UNIT/ML KwikPen INJECT 10 UNITS UNDER THE SKIN TWICE A DAY  . Insulin Pen Needle (B-D ULTRAFINE III SHORT PEN) 31G X 8 MM MISC 1 each by Does not apply route as directed.  . levETIRAcetam (KEPPRA) 500 MG tablet Take 250 mg by mouth at bedtime.   Marland Kitchen levothyroxine (SYNTHROID) 112 MCG tablet TAKE 1 TABLET DAILY BEFORE BREAKFAST  . Multiple Vitamins-Minerals (MULTIVITAMIN WITH MINERALS) tablet Take 1 tablet by mouth daily.   Marland Kitchen olopatadine (PATANOL) 0.1 % ophthalmic solution INSTILL 1 DROP INTO EACH EYE TWICE DAILY  . OneTouch Delica Lancets 17H MISC 1 each by Other route 2 (two) times daily.  Glory Rosebush ULTRA test strip USE 1 STRIP TWICE A DAY AS INSTRUCTED  . polyethylene glycol powder (GLYCOLAX/MIRALAX) 17 GM/SCOOP powder Take 17 g by mouth daily.  . pravastatin (PRAVACHOL) 20 MG tablet TAKE 1 TABLET DAILY  . tamsulosin (FLOMAX) 0.4 MG CAPS capsule Take 1 capsule (0.4 mg total) by mouth daily.  . Vitamin D, Ergocalciferol, (DRISDOL) 1.25 MG (50000 UNIT) CAPS capsule Take 50,000 Units by mouth once a week.  . zinc gluconate 50 MG tablet Take by mouth.   No facility-administered encounter medications on file as of 10/03/2020.   ALLERGIES: Allergies  Allergen Reactions  . Bayer Advanced Aspirin [Aspirin] Nausea And Vomiting  . Penicillins Nausea And Vomiting    Has patient had a PCN  reaction causing immediate rash, facial/tongue/throat swelling, SOB or lightheadedness with hypotension: unknown Has patient had a PCN reaction causing severe rash involving mucus membranes or skin necrosis: unknown Has patient had a PCN reaction that required hospitalization: unknown Has patient had a PCN reaction occurring within the last 10 years: no If all of the above answers are "NO", then may proceed with Cephalosporin use.   VACCINATION STATUS: Immunization History  Administered Date(s) Administered  . Fluad Quad(high Dose 65+) 08/07/2019  . H1N1 11/14/2008  . Influenza Split 10/07/2011, 09/08/2012  . Influenza Whole 08/22/2007, 08/27/2010  . Influenza,inj,Quad PF,6+ Mos 08/22/2013, 09/26/2014, 09/26/2015, 09/01/2016, 09/20/2017, 09/26/2018  . Moderna SARS-COVID-2 Vaccination 01/01/2020, 02/01/2020  . Pneumococcal Conjugate-13 06/12/2014  . Pneumococcal Polysaccharide-23 05/21/2004  . Td 05/21/2004  . Tdap 10/07/2011    Diabetes He presents for his follow-up diabetic visit. He has type 2 diabetes mellitus. The  initial diagnosis of diabetes was made 20 years (He was diagnosed at approximate age of 30 years.) ago. His disease course has been worsening. There are no hypoglycemic associated symptoms. Pertinent negatives for hypoglycemia include no confusion, headaches, nervousness/anxiousness, pallor, seizures or tremors. There are no diabetic associated symptoms. Pertinent negatives for diabetes include no chest pain, no fatigue, no polydipsia, no polyphagia, no polyuria, no weakness and no weight loss. There are no hypoglycemic complications. Symptoms are worsening. Diabetic complications include a CVA, nephropathy and PVD. Risk factors for coronary artery disease include diabetes mellitus, dyslipidemia, male sex, obesity, sedentary lifestyle and hypertension. Current diabetic treatment includes insulin injections. He is compliant with treatment most of the time. His weight is stable. He is following a diabetic and generally healthy diet. When asked about meal planning, he reported none. He has not had a previous visit with a dietitian. He never participates in exercise. His home blood glucose trend is fluctuating minimally. His breakfast blood glucose range is generally 110-130 mg/dl. His bedtime blood glucose range is generally >200 mg/dl. (He presents today, accompanied by his wife, with his logs showing near target fasting and above target postprandial glycemic profile.  His POCT A1C today is 6.3%, worsening slightly from last visit of 5.8%, but still an acceptable target A1C for him.  She does not give him his premixed insulin if his glucose is less than 150.  There are no documented or reported episodes of hypoglycemia.) An ACE inhibitor/angiotensin II receptor blocker is not being taken. He does not see a podiatrist.Eye exam is current.  Hyperlipidemia This is a chronic problem. The current episode started more than 1 year ago. The problem is controlled. Recent lipid tests were reviewed and are normal. Exacerbating  diseases include chronic renal disease, diabetes and hypothyroidism. There are no known factors aggravating his hyperlipidemia. Pertinent negatives include no chest pain, myalgias or shortness of breath. Current antihyperlipidemic treatment includes statins. The current treatment provides moderate improvement of lipids. There are no compliance problems.  Risk factors for coronary artery disease include dyslipidemia, diabetes mellitus, hypertension, male sex and a sedentary lifestyle.  Hypertension This is a chronic problem. The current episode started more than 1 year ago. The problem has been gradually improving since onset. The problem is controlled. Pertinent negatives include no chest pain, headaches, neck pain, palpitations or shortness of breath. Agents associated with hypertension include thyroid hormones. Risk factors for coronary artery disease include diabetes mellitus, dyslipidemia, sedentary lifestyle and male gender. Past treatments include direct vasodilators and calcium channel blockers. The current treatment provides mild improvement. There are no compliance problems.  Hypertensive  end-organ damage includes kidney disease, CAD/MI, CVA and PVD. Identifiable causes of hypertension include chronic renal disease and a thyroid problem.  Thyroid Problem Presents for follow-up visit. Patient reports no anxiety, cold intolerance, constipation, depressed mood, diarrhea, fatigue, heat intolerance, palpitations, tremors, weight gain or weight loss. The symptoms have been stable. Past treatments include levothyroxine. His past medical history is significant for diabetes and hyperlipidemia.   Review of systems  Constitutional: + Minimally fluctuating body weight,  current Body mass index is 26.91 kg/m. , no fatigue, no subjective hyperthermia, no subjective hypothermia Eyes: no blurry vision, no xerophthalmia ENT: no sore throat, no nodules palpated in throat, no dysphagia/odynophagia, no  hoarseness Cardiovascular: no chest pain, no shortness of breath, no palpitations, no leg swelling Respiratory: no cough, no shortness of breath Gastrointestinal: no nausea/vomiting/diarrhea Musculoskeletal: no muscle/joint aches, essentially w/c bound due to deconditioning Skin: no rashes, no hyperemia Neurological: no tremors, no numbness, no tingling, no dizziness Psychiatric: no depression, no anxiety   Objective:    BP (!) 158/77 (BP Location: Left Arm, Patient Position: Sitting)   Pulse 66   Ht 5\' 8"  (1.727 m)   Wt 177 lb (80.3 kg)   BMI 26.91 kg/m   Wt Readings from Last 3 Encounters:  10/03/20 177 lb (80.3 kg)  10/01/20 171 lb (77.6 kg)  08/09/20 171 lb (77.6 kg)    BP Readings from Last 3 Encounters:  10/03/20 (!) 158/77  10/01/20 120/82  08/09/20 120/82     Physical Exam- Limited  Constitutional:  Body mass index is 26.91 kg/m. , not in acute distress, normal state of mind Eyes:  EOMI, no exophthalmos Neck: Supple Thyroid: No gross goiter Cardiovascular: RRR, no murmers, rubs, or gallops, no edema Respiratory: Adequate breathing efforts, no crackles, rales, rhonchi, or wheezing Musculoskeletal: no gross deformities, no gross restriction of joint movements, essentially w/c bound due to deconditioning Skin:  no rashes, no hyperemia Neurological: no tremor with outstretched hands    CMP     Component Value Date/Time   NA 143 10/01/2020 1136   K 4.8 10/01/2020 1136   CL 110 10/01/2020 1136   CO2 22 10/01/2020 1136   GLUCOSE 183 (H) 10/01/2020 1136   BUN 47 (H) 10/01/2020 1136   CREATININE 3.15 (H) 10/01/2020 1136   CALCIUM 9.3 10/01/2020 1136   PROT 6.6 10/01/2020 1136   ALBUMIN 3.7 05/27/2018 1016   AST 18 10/01/2020 1136   ALT 11 10/01/2020 1136   ALKPHOS 118 05/27/2018 1016   BILITOT 0.4 10/01/2020 1136   GFRNONAA 16 (L) 07/05/2020 0950   GFRAA 19 (L) 07/05/2020 0950   Diabetic Labs (most recent): Lab Results  Component Value Date   HGBA1C  6.3 (A) 10/03/2020   HGBA1C 5.8 (A) 06/28/2020   HGBA1C 5.9 (H) 02/07/2020    Lipid Panel     Component Value Date/Time   CHOL 165 10/01/2020 1136   TRIG 42 10/01/2020 1136   HDL 78 10/01/2020 1136   CHOLHDL 2.1 10/01/2020 1136   VLDL 8 03/12/2016 1139   LDLCALC 75 10/01/2020 1136     Assessment & Plan:   1.  type 2 diabetes mellitus with stage 4 chronic kidney disease, with long-term current use of insulin (HCC)  -his diabetes is  complicated by chronic kidney disease stage IV and patient remains at a high risk for more acute and chronic complications of diabetes which include CAD, CVA, CKD, retinopathy, and neuropathy. These are all discussed in detail with the patient.  Wife  reports has not seen ophthalmologist in a few years.  Encouraged her to reach back out to them for annual diabetic eye exam.  -His wife is assisting on 100% of his care.  He presents today, accompanied by his wife, with his logs showing near target fasting and above target postprandial glycemic profile.  His POCT A1C today is 6.3%, worsening slightly from last visit of 5.8%, but still an acceptable target A1C for him.  She does not give him his premixed insulin if his glucose is less than 150.  There are no documented or reported episodes of hypoglycemia.  - Nutritional counseling repeated at each appointment due to patients tendency to fall back in to old habits.  - The patient admits there is a room for improvement in their diet and drink choices. -  Suggestion is made for the patient to avoid simple carbohydrates from their diet including Cakes, Sweet Desserts / Pastries, Ice Cream, Soda (diet and regular), Sweet Tea, Candies, Chips, Cookies, Sweet Pastries,  Store Bought Juices, Alcohol in Excess of  1-2 drinks a day, Artificial Sweeteners, Coffee Creamer, and "Sugar-free" Products. This will help patient to have stable blood glucose profile and potentially avoid unintended weight gain.   - I encouraged  the patient to switch to  unprocessed or minimally processed complex starch and increased protein intake (animal or plant source), fruits, and vegetables.   - Patient is advised to stick to a routine mealtimes to eat 3 meals  a day and avoid unnecessary snacks ( to snack only to correct hypoglycemia).  - I have approached patient with the following individualized plan to manage diabetes and patient agrees.  -Advised patient and wife to continue on current regimen of Novolin 70/30 10 units twice a day with breakfast and supper when premeal blood glucose is greater than 150 and eating.    -They are advised to continue monitoring blood sugars twice a day- before breakfast and before supper and to call the clinic if they have readings less than 70 or greater than 300 for 3 tests in a row.   - Patient is warned not to take insulin without proper monitoring per orders.  -Patient is not a candidate for metformin,SGLT2 inhibitors due to CKD.  2) BP/HTN:  His blood pressure is controlled to target for his age.  He is advised to continue Amlodipine 10 mg po daily.  3) Lipids/HPL:  His recent lipid panel from 10/01/20 shows controlled LDL of 75.  He is advised to continue Pravastatin 20 mg po daily before bed.  Side effects and precautions discussed with him.    4) Hypothyroidism:  -He does not have recent TFTs to review.  He is advised to continue Levothyroxine 112 mcg po daily before breakfast.  Will recheck thyroid function tests prior to next visit and adjust dose if needed.   - We discussed about the correct intake of his thyroid hormone, on empty stomach at fasting, with water, separated by at least 30 minutes from breakfast and other medications,  and separated by more than 4 hours from calcium, iron, multivitamins, acid reflux medications (PPIs). -Patient is made aware of the fact that thyroid hormone replacement is needed for life, dose to be adjusted by periodic monitoring of thyroid function  tests.   - I advised patient to maintain close follow up with [name], MD for primary care needs.  - Time spent on this patient care encounter:  35 min, of which > 50% was spent in  counseling and the rest reviewing his blood glucose logs , discussing his hypoglycemia and hyperglycemia episodes, reviewing his current and  previous labs / studies  ( including abstraction from other facilities) and medications  doses and developing a  long term treatment plan and documenting his care.   Please refer to Patient Instructions for Blood Glucose Monitoring and Insulin/Medications Dosing Guide"  in media tab for additional information. Please  also refer to " Patient Self Inventory" in the Media  tab for reviewed elements of pertinent patient history.  [name] participated in the discussions, expressed understanding, and voiced agreement with the above plans.  All questions were answered to his satisfaction. he is encouraged to contact clinic should he have any questions or concerns prior to his return visit.  Follow up plan: -Return in about 6 months (around 04/03/2021) for Diabetes follow up- A1c and urine micro in office, Thyroid follow up, Previsit labs.  Rayetta Pigg, FNP-BC [facility] Phone: [phone] Fax: [phone]  10/03/2020, 2:36 PM

## 2020-10-03 NOTE — Patient Instructions (Signed)

## 2020-10-04 ENCOUNTER — Telehealth: Payer: Self-pay

## 2020-10-04 DIAGNOSIS — L851 Acquired keratosis [keratoderma] palmaris et plantaris: Secondary | ICD-10-CM | POA: Diagnosis not present

## 2020-10-04 DIAGNOSIS — E1142 Type 2 diabetes mellitus with diabetic polyneuropathy: Secondary | ICD-10-CM | POA: Diagnosis not present

## 2020-10-04 DIAGNOSIS — L97321 Non-pressure chronic ulcer of left ankle limited to breakdown of skin: Secondary | ICD-10-CM | POA: Diagnosis not present

## 2020-10-04 DIAGNOSIS — E1151 Type 2 diabetes mellitus with diabetic peripheral angiopathy without gangrene: Secondary | ICD-10-CM | POA: Diagnosis not present

## 2020-10-04 DIAGNOSIS — B351 Tinea unguium: Secondary | ICD-10-CM | POA: Diagnosis not present

## 2020-10-04 DIAGNOSIS — Z4800 Encounter for change or removal of nonsurgical wound dressing: Secondary | ICD-10-CM | POA: Diagnosis not present

## 2020-10-04 DIAGNOSIS — E039 Hypothyroidism, unspecified: Secondary | ICD-10-CM | POA: Diagnosis not present

## 2020-10-04 DIAGNOSIS — I83023 Varicose veins of left lower extremity with ulcer of ankle: Secondary | ICD-10-CM | POA: Diagnosis not present

## 2020-10-04 DIAGNOSIS — I10 Essential (primary) hypertension: Secondary | ICD-10-CM | POA: Diagnosis not present

## 2020-10-04 NOTE — Telephone Encounter (Signed)
Bertram Millard patients [facility] nurse called wanting verbal orders for a PT eval for patient due to weakness and unsteadiness even with a walker. She stated detailed message could be left on vm if she did not answer. Called back. No answer. lvm giving her the verbal order for PT eval.

## 2020-10-06 NOTE — Telephone Encounter (Signed)
Thank you. Noted I agree

## 2020-10-07 ENCOUNTER — Other Ambulatory Visit: Payer: Self-pay

## 2020-10-07 ENCOUNTER — Ambulatory Visit (INDEPENDENT_AMBULATORY_CARE_PROVIDER_SITE_OTHER): Payer: Medicare Other

## 2020-10-07 DIAGNOSIS — Z23 Encounter for immunization: Secondary | ICD-10-CM

## 2020-10-07 DIAGNOSIS — Z4800 Encounter for change or removal of nonsurgical wound dressing: Secondary | ICD-10-CM | POA: Diagnosis not present

## 2020-10-07 DIAGNOSIS — E039 Hypothyroidism, unspecified: Secondary | ICD-10-CM | POA: Diagnosis not present

## 2020-10-07 DIAGNOSIS — I10 Essential (primary) hypertension: Secondary | ICD-10-CM | POA: Diagnosis not present

## 2020-10-07 DIAGNOSIS — I83023 Varicose veins of left lower extremity with ulcer of ankle: Secondary | ICD-10-CM | POA: Diagnosis not present

## 2020-10-07 DIAGNOSIS — E1151 Type 2 diabetes mellitus with diabetic peripheral angiopathy without gangrene: Secondary | ICD-10-CM | POA: Diagnosis not present

## 2020-10-07 DIAGNOSIS — L97321 Non-pressure chronic ulcer of left ankle limited to breakdown of skin: Secondary | ICD-10-CM | POA: Diagnosis not present

## 2020-10-09 DIAGNOSIS — E785 Hyperlipidemia, unspecified: Secondary | ICD-10-CM | POA: Diagnosis not present

## 2020-10-09 DIAGNOSIS — I10 Essential (primary) hypertension: Secondary | ICD-10-CM | POA: Diagnosis not present

## 2020-10-09 DIAGNOSIS — Z7902 Long term (current) use of antithrombotics/antiplatelets: Secondary | ICD-10-CM | POA: Diagnosis not present

## 2020-10-09 DIAGNOSIS — Z794 Long term (current) use of insulin: Secondary | ICD-10-CM | POA: Diagnosis not present

## 2020-10-09 DIAGNOSIS — Z4800 Encounter for change or removal of nonsurgical wound dressing: Secondary | ICD-10-CM | POA: Diagnosis not present

## 2020-10-09 DIAGNOSIS — E1151 Type 2 diabetes mellitus with diabetic peripheral angiopathy without gangrene: Secondary | ICD-10-CM | POA: Diagnosis not present

## 2020-10-09 DIAGNOSIS — L97321 Non-pressure chronic ulcer of left ankle limited to breakdown of skin: Secondary | ICD-10-CM | POA: Diagnosis not present

## 2020-10-09 DIAGNOSIS — Z9181 History of falling: Secondary | ICD-10-CM | POA: Diagnosis not present

## 2020-10-09 DIAGNOSIS — E039 Hypothyroidism, unspecified: Secondary | ICD-10-CM | POA: Diagnosis not present

## 2020-10-09 DIAGNOSIS — I83023 Varicose veins of left lower extremity with ulcer of ankle: Secondary | ICD-10-CM | POA: Diagnosis not present

## 2020-10-11 DIAGNOSIS — E1151 Type 2 diabetes mellitus with diabetic peripheral angiopathy without gangrene: Secondary | ICD-10-CM | POA: Diagnosis not present

## 2020-10-11 DIAGNOSIS — L97321 Non-pressure chronic ulcer of left ankle limited to breakdown of skin: Secondary | ICD-10-CM | POA: Diagnosis not present

## 2020-10-11 DIAGNOSIS — I83023 Varicose veins of left lower extremity with ulcer of ankle: Secondary | ICD-10-CM | POA: Diagnosis not present

## 2020-10-11 DIAGNOSIS — E039 Hypothyroidism, unspecified: Secondary | ICD-10-CM | POA: Diagnosis not present

## 2020-10-11 DIAGNOSIS — I10 Essential (primary) hypertension: Secondary | ICD-10-CM | POA: Diagnosis not present

## 2020-10-11 DIAGNOSIS — Z4800 Encounter for change or removal of nonsurgical wound dressing: Secondary | ICD-10-CM | POA: Diagnosis not present

## 2020-10-14 DIAGNOSIS — Z4800 Encounter for change or removal of nonsurgical wound dressing: Secondary | ICD-10-CM | POA: Diagnosis not present

## 2020-10-14 DIAGNOSIS — I10 Essential (primary) hypertension: Secondary | ICD-10-CM | POA: Diagnosis not present

## 2020-10-14 DIAGNOSIS — I83023 Varicose veins of left lower extremity with ulcer of ankle: Secondary | ICD-10-CM | POA: Diagnosis not present

## 2020-10-14 DIAGNOSIS — E1151 Type 2 diabetes mellitus with diabetic peripheral angiopathy without gangrene: Secondary | ICD-10-CM | POA: Diagnosis not present

## 2020-10-14 DIAGNOSIS — L97321 Non-pressure chronic ulcer of left ankle limited to breakdown of skin: Secondary | ICD-10-CM | POA: Diagnosis not present

## 2020-10-14 DIAGNOSIS — E039 Hypothyroidism, unspecified: Secondary | ICD-10-CM | POA: Diagnosis not present

## 2020-10-15 DIAGNOSIS — E1151 Type 2 diabetes mellitus with diabetic peripheral angiopathy without gangrene: Secondary | ICD-10-CM | POA: Diagnosis not present

## 2020-10-15 DIAGNOSIS — I83023 Varicose veins of left lower extremity with ulcer of ankle: Secondary | ICD-10-CM | POA: Diagnosis not present

## 2020-10-15 DIAGNOSIS — I10 Essential (primary) hypertension: Secondary | ICD-10-CM | POA: Diagnosis not present

## 2020-10-15 DIAGNOSIS — L97321 Non-pressure chronic ulcer of left ankle limited to breakdown of skin: Secondary | ICD-10-CM | POA: Diagnosis not present

## 2020-10-15 DIAGNOSIS — E039 Hypothyroidism, unspecified: Secondary | ICD-10-CM | POA: Diagnosis not present

## 2020-10-15 DIAGNOSIS — Z4800 Encounter for change or removal of nonsurgical wound dressing: Secondary | ICD-10-CM | POA: Diagnosis not present

## 2020-10-16 DIAGNOSIS — Z4800 Encounter for change or removal of nonsurgical wound dressing: Secondary | ICD-10-CM | POA: Diagnosis not present

## 2020-10-16 DIAGNOSIS — E1151 Type 2 diabetes mellitus with diabetic peripheral angiopathy without gangrene: Secondary | ICD-10-CM | POA: Diagnosis not present

## 2020-10-16 DIAGNOSIS — E039 Hypothyroidism, unspecified: Secondary | ICD-10-CM | POA: Diagnosis not present

## 2020-10-16 DIAGNOSIS — L97321 Non-pressure chronic ulcer of left ankle limited to breakdown of skin: Secondary | ICD-10-CM | POA: Diagnosis not present

## 2020-10-16 DIAGNOSIS — I83023 Varicose veins of left lower extremity with ulcer of ankle: Secondary | ICD-10-CM | POA: Diagnosis not present

## 2020-10-16 DIAGNOSIS — I10 Essential (primary) hypertension: Secondary | ICD-10-CM | POA: Diagnosis not present

## 2020-10-17 DIAGNOSIS — L97321 Non-pressure chronic ulcer of left ankle limited to breakdown of skin: Secondary | ICD-10-CM | POA: Diagnosis not present

## 2020-10-17 DIAGNOSIS — E1151 Type 2 diabetes mellitus with diabetic peripheral angiopathy without gangrene: Secondary | ICD-10-CM | POA: Diagnosis not present

## 2020-10-17 DIAGNOSIS — I10 Essential (primary) hypertension: Secondary | ICD-10-CM | POA: Diagnosis not present

## 2020-10-17 DIAGNOSIS — E039 Hypothyroidism, unspecified: Secondary | ICD-10-CM | POA: Diagnosis not present

## 2020-10-17 DIAGNOSIS — Z4800 Encounter for change or removal of nonsurgical wound dressing: Secondary | ICD-10-CM | POA: Diagnosis not present

## 2020-10-17 DIAGNOSIS — I83023 Varicose veins of left lower extremity with ulcer of ankle: Secondary | ICD-10-CM | POA: Diagnosis not present

## 2020-10-18 DIAGNOSIS — I83023 Varicose veins of left lower extremity with ulcer of ankle: Secondary | ICD-10-CM | POA: Diagnosis not present

## 2020-10-18 DIAGNOSIS — I10 Essential (primary) hypertension: Secondary | ICD-10-CM | POA: Diagnosis not present

## 2020-10-18 DIAGNOSIS — E11622 Type 2 diabetes mellitus with other skin ulcer: Secondary | ICD-10-CM | POA: Diagnosis not present

## 2020-10-18 DIAGNOSIS — E039 Hypothyroidism, unspecified: Secondary | ICD-10-CM | POA: Diagnosis not present

## 2020-10-18 DIAGNOSIS — L97329 Non-pressure chronic ulcer of left ankle with unspecified severity: Secondary | ICD-10-CM | POA: Diagnosis not present

## 2020-10-18 DIAGNOSIS — I70243 Atherosclerosis of native arteries of left leg with ulceration of ankle: Secondary | ICD-10-CM | POA: Diagnosis not present

## 2020-10-18 DIAGNOSIS — Z79890 Hormone replacement therapy (postmenopausal): Secondary | ICD-10-CM | POA: Diagnosis not present

## 2020-10-21 DIAGNOSIS — L97321 Non-pressure chronic ulcer of left ankle limited to breakdown of skin: Secondary | ICD-10-CM | POA: Diagnosis not present

## 2020-10-21 DIAGNOSIS — Z4800 Encounter for change or removal of nonsurgical wound dressing: Secondary | ICD-10-CM | POA: Diagnosis not present

## 2020-10-21 DIAGNOSIS — E039 Hypothyroidism, unspecified: Secondary | ICD-10-CM | POA: Diagnosis not present

## 2020-10-21 DIAGNOSIS — I10 Essential (primary) hypertension: Secondary | ICD-10-CM | POA: Diagnosis not present

## 2020-10-21 DIAGNOSIS — I83023 Varicose veins of left lower extremity with ulcer of ankle: Secondary | ICD-10-CM | POA: Diagnosis not present

## 2020-10-21 DIAGNOSIS — E1151 Type 2 diabetes mellitus with diabetic peripheral angiopathy without gangrene: Secondary | ICD-10-CM | POA: Diagnosis not present

## 2020-10-22 DIAGNOSIS — L97321 Non-pressure chronic ulcer of left ankle limited to breakdown of skin: Secondary | ICD-10-CM | POA: Diagnosis not present

## 2020-10-22 DIAGNOSIS — I83023 Varicose veins of left lower extremity with ulcer of ankle: Secondary | ICD-10-CM | POA: Diagnosis not present

## 2020-10-22 DIAGNOSIS — E039 Hypothyroidism, unspecified: Secondary | ICD-10-CM | POA: Diagnosis not present

## 2020-10-22 DIAGNOSIS — I10 Essential (primary) hypertension: Secondary | ICD-10-CM | POA: Diagnosis not present

## 2020-10-22 DIAGNOSIS — Z4800 Encounter for change or removal of nonsurgical wound dressing: Secondary | ICD-10-CM | POA: Diagnosis not present

## 2020-10-22 DIAGNOSIS — E1151 Type 2 diabetes mellitus with diabetic peripheral angiopathy without gangrene: Secondary | ICD-10-CM | POA: Diagnosis not present

## 2020-10-23 DIAGNOSIS — L97321 Non-pressure chronic ulcer of left ankle limited to breakdown of skin: Secondary | ICD-10-CM | POA: Diagnosis not present

## 2020-10-23 DIAGNOSIS — Z4800 Encounter for change or removal of nonsurgical wound dressing: Secondary | ICD-10-CM | POA: Diagnosis not present

## 2020-10-23 DIAGNOSIS — I83023 Varicose veins of left lower extremity with ulcer of ankle: Secondary | ICD-10-CM | POA: Diagnosis not present

## 2020-10-23 DIAGNOSIS — E039 Hypothyroidism, unspecified: Secondary | ICD-10-CM | POA: Diagnosis not present

## 2020-10-23 DIAGNOSIS — E1151 Type 2 diabetes mellitus with diabetic peripheral angiopathy without gangrene: Secondary | ICD-10-CM | POA: Diagnosis not present

## 2020-10-23 DIAGNOSIS — I10 Essential (primary) hypertension: Secondary | ICD-10-CM | POA: Diagnosis not present

## 2020-10-24 DIAGNOSIS — Z4800 Encounter for change or removal of nonsurgical wound dressing: Secondary | ICD-10-CM | POA: Diagnosis not present

## 2020-10-24 DIAGNOSIS — I10 Essential (primary) hypertension: Secondary | ICD-10-CM | POA: Diagnosis not present

## 2020-10-24 DIAGNOSIS — L97321 Non-pressure chronic ulcer of left ankle limited to breakdown of skin: Secondary | ICD-10-CM | POA: Diagnosis not present

## 2020-10-24 DIAGNOSIS — E039 Hypothyroidism, unspecified: Secondary | ICD-10-CM | POA: Diagnosis not present

## 2020-10-24 DIAGNOSIS — E1151 Type 2 diabetes mellitus with diabetic peripheral angiopathy without gangrene: Secondary | ICD-10-CM | POA: Diagnosis not present

## 2020-10-24 DIAGNOSIS — I83023 Varicose veins of left lower extremity with ulcer of ankle: Secondary | ICD-10-CM | POA: Diagnosis not present

## 2020-10-25 DIAGNOSIS — Z79899 Other long term (current) drug therapy: Secondary | ICD-10-CM | POA: Diagnosis not present

## 2020-10-25 DIAGNOSIS — E039 Hypothyroidism, unspecified: Secondary | ICD-10-CM | POA: Diagnosis not present

## 2020-10-25 DIAGNOSIS — L97329 Non-pressure chronic ulcer of left ankle with unspecified severity: Secondary | ICD-10-CM | POA: Diagnosis not present

## 2020-10-25 DIAGNOSIS — I10 Essential (primary) hypertension: Secondary | ICD-10-CM | POA: Diagnosis not present

## 2020-10-25 DIAGNOSIS — I70243 Atherosclerosis of native arteries of left leg with ulceration of ankle: Secondary | ICD-10-CM | POA: Diagnosis not present

## 2020-10-25 DIAGNOSIS — Z7902 Long term (current) use of antithrombotics/antiplatelets: Secondary | ICD-10-CM | POA: Diagnosis not present

## 2020-10-25 DIAGNOSIS — Z79890 Hormone replacement therapy (postmenopausal): Secondary | ICD-10-CM | POA: Diagnosis not present

## 2020-10-25 DIAGNOSIS — E1151 Type 2 diabetes mellitus with diabetic peripheral angiopathy without gangrene: Secondary | ICD-10-CM | POA: Diagnosis not present

## 2020-10-25 DIAGNOSIS — Z792 Long term (current) use of antibiotics: Secondary | ICD-10-CM | POA: Diagnosis not present

## 2020-10-25 DIAGNOSIS — Z794 Long term (current) use of insulin: Secondary | ICD-10-CM | POA: Diagnosis not present

## 2020-10-25 DIAGNOSIS — Z880 Allergy status to penicillin: Secondary | ICD-10-CM | POA: Diagnosis not present

## 2020-10-28 DIAGNOSIS — L97321 Non-pressure chronic ulcer of left ankle limited to breakdown of skin: Secondary | ICD-10-CM | POA: Diagnosis not present

## 2020-10-28 DIAGNOSIS — I10 Essential (primary) hypertension: Secondary | ICD-10-CM | POA: Diagnosis not present

## 2020-10-28 DIAGNOSIS — E039 Hypothyroidism, unspecified: Secondary | ICD-10-CM | POA: Diagnosis not present

## 2020-10-28 DIAGNOSIS — I83023 Varicose veins of left lower extremity with ulcer of ankle: Secondary | ICD-10-CM | POA: Diagnosis not present

## 2020-10-28 DIAGNOSIS — Z4800 Encounter for change or removal of nonsurgical wound dressing: Secondary | ICD-10-CM | POA: Diagnosis not present

## 2020-10-28 DIAGNOSIS — E1151 Type 2 diabetes mellitus with diabetic peripheral angiopathy without gangrene: Secondary | ICD-10-CM | POA: Diagnosis not present

## 2020-10-29 ENCOUNTER — Encounter (HOSPITAL_COMMUNITY): Payer: Medicare Other

## 2020-10-29 ENCOUNTER — Encounter: Payer: Medicare Other | Admitting: Vascular Surgery

## 2020-10-29 DIAGNOSIS — I83023 Varicose veins of left lower extremity with ulcer of ankle: Secondary | ICD-10-CM | POA: Diagnosis not present

## 2020-10-29 DIAGNOSIS — E1151 Type 2 diabetes mellitus with diabetic peripheral angiopathy without gangrene: Secondary | ICD-10-CM | POA: Diagnosis not present

## 2020-10-29 DIAGNOSIS — L97321 Non-pressure chronic ulcer of left ankle limited to breakdown of skin: Secondary | ICD-10-CM | POA: Diagnosis not present

## 2020-10-29 DIAGNOSIS — I10 Essential (primary) hypertension: Secondary | ICD-10-CM | POA: Diagnosis not present

## 2020-10-29 DIAGNOSIS — Z4800 Encounter for change or removal of nonsurgical wound dressing: Secondary | ICD-10-CM | POA: Diagnosis not present

## 2020-10-29 DIAGNOSIS — E039 Hypothyroidism, unspecified: Secondary | ICD-10-CM | POA: Diagnosis not present

## 2020-10-30 DIAGNOSIS — I10 Essential (primary) hypertension: Secondary | ICD-10-CM | POA: Diagnosis not present

## 2020-10-30 DIAGNOSIS — E1151 Type 2 diabetes mellitus with diabetic peripheral angiopathy without gangrene: Secondary | ICD-10-CM | POA: Diagnosis not present

## 2020-10-30 DIAGNOSIS — L97321 Non-pressure chronic ulcer of left ankle limited to breakdown of skin: Secondary | ICD-10-CM | POA: Diagnosis not present

## 2020-10-30 DIAGNOSIS — I83023 Varicose veins of left lower extremity with ulcer of ankle: Secondary | ICD-10-CM | POA: Diagnosis not present

## 2020-10-30 DIAGNOSIS — Z4800 Encounter for change or removal of nonsurgical wound dressing: Secondary | ICD-10-CM | POA: Diagnosis not present

## 2020-10-30 DIAGNOSIS — E039 Hypothyroidism, unspecified: Secondary | ICD-10-CM | POA: Diagnosis not present

## 2020-11-07 ENCOUNTER — Other Ambulatory Visit: Payer: Self-pay

## 2020-11-07 DIAGNOSIS — I779 Disorder of arteries and arterioles, unspecified: Secondary | ICD-10-CM

## 2020-11-07 DIAGNOSIS — E039 Hypothyroidism, unspecified: Secondary | ICD-10-CM | POA: Diagnosis not present

## 2020-11-07 DIAGNOSIS — I10 Essential (primary) hypertension: Secondary | ICD-10-CM | POA: Diagnosis not present

## 2020-11-07 DIAGNOSIS — I83023 Varicose veins of left lower extremity with ulcer of ankle: Secondary | ICD-10-CM | POA: Diagnosis not present

## 2020-11-07 DIAGNOSIS — E1151 Type 2 diabetes mellitus with diabetic peripheral angiopathy without gangrene: Secondary | ICD-10-CM | POA: Diagnosis not present

## 2020-11-07 DIAGNOSIS — Z4800 Encounter for change or removal of nonsurgical wound dressing: Secondary | ICD-10-CM | POA: Diagnosis not present

## 2020-11-07 DIAGNOSIS — L97321 Non-pressure chronic ulcer of left ankle limited to breakdown of skin: Secondary | ICD-10-CM | POA: Diagnosis not present

## 2020-11-07 DIAGNOSIS — G40909 Epilepsy, unspecified, not intractable, without status epilepticus: Secondary | ICD-10-CM | POA: Insufficient documentation

## 2020-11-07 DIAGNOSIS — E1142 Type 2 diabetes mellitus with diabetic polyneuropathy: Secondary | ICD-10-CM | POA: Insufficient documentation

## 2020-11-08 DIAGNOSIS — I10 Essential (primary) hypertension: Secondary | ICD-10-CM | POA: Diagnosis not present

## 2020-11-08 DIAGNOSIS — E785 Hyperlipidemia, unspecified: Secondary | ICD-10-CM | POA: Diagnosis not present

## 2020-11-08 DIAGNOSIS — L97321 Non-pressure chronic ulcer of left ankle limited to breakdown of skin: Secondary | ICD-10-CM | POA: Diagnosis not present

## 2020-11-08 DIAGNOSIS — Z79890 Hormone replacement therapy (postmenopausal): Secondary | ICD-10-CM | POA: Diagnosis not present

## 2020-11-08 DIAGNOSIS — Z4800 Encounter for change or removal of nonsurgical wound dressing: Secondary | ICD-10-CM | POA: Diagnosis not present

## 2020-11-08 DIAGNOSIS — L97329 Non-pressure chronic ulcer of left ankle with unspecified severity: Secondary | ICD-10-CM | POA: Diagnosis not present

## 2020-11-08 DIAGNOSIS — R531 Weakness: Secondary | ICD-10-CM | POA: Diagnosis not present

## 2020-11-08 DIAGNOSIS — E039 Hypothyroidism, unspecified: Secondary | ICD-10-CM | POA: Diagnosis not present

## 2020-11-08 DIAGNOSIS — I70243 Atherosclerosis of native arteries of left leg with ulceration of ankle: Secondary | ICD-10-CM | POA: Diagnosis not present

## 2020-11-08 DIAGNOSIS — E1151 Type 2 diabetes mellitus with diabetic peripheral angiopathy without gangrene: Secondary | ICD-10-CM | POA: Diagnosis not present

## 2020-11-08 DIAGNOSIS — Z9181 History of falling: Secondary | ICD-10-CM | POA: Diagnosis not present

## 2020-11-08 DIAGNOSIS — I83023 Varicose veins of left lower extremity with ulcer of ankle: Secondary | ICD-10-CM | POA: Diagnosis not present

## 2020-11-08 DIAGNOSIS — Z794 Long term (current) use of insulin: Secondary | ICD-10-CM | POA: Diagnosis not present

## 2020-11-08 DIAGNOSIS — Z7902 Long term (current) use of antithrombotics/antiplatelets: Secondary | ICD-10-CM | POA: Diagnosis not present

## 2020-11-11 ENCOUNTER — Other Ambulatory Visit (HOSPITAL_COMMUNITY): Payer: Self-pay | Admitting: Podiatry

## 2020-11-11 DIAGNOSIS — I83023 Varicose veins of left lower extremity with ulcer of ankle: Secondary | ICD-10-CM | POA: Diagnosis not present

## 2020-11-11 DIAGNOSIS — E039 Hypothyroidism, unspecified: Secondary | ICD-10-CM | POA: Diagnosis not present

## 2020-11-11 DIAGNOSIS — E1151 Type 2 diabetes mellitus with diabetic peripheral angiopathy without gangrene: Secondary | ICD-10-CM | POA: Diagnosis not present

## 2020-11-11 DIAGNOSIS — I739 Peripheral vascular disease, unspecified: Secondary | ICD-10-CM

## 2020-11-11 DIAGNOSIS — L97321 Non-pressure chronic ulcer of left ankle limited to breakdown of skin: Secondary | ICD-10-CM | POA: Diagnosis not present

## 2020-11-11 DIAGNOSIS — R531 Weakness: Secondary | ICD-10-CM | POA: Diagnosis not present

## 2020-11-11 DIAGNOSIS — Z4800 Encounter for change or removal of nonsurgical wound dressing: Secondary | ICD-10-CM | POA: Diagnosis not present

## 2020-11-12 ENCOUNTER — Ambulatory Visit (HOSPITAL_COMMUNITY)
Admission: RE | Admit: 2020-11-12 | Discharge: 2020-11-12 | Disposition: A | Discharge status: Home / Self Care | Payer: Medicare Other | Source: Ambulatory Visit | Attending: Vascular Surgery | Admitting: Vascular Surgery

## 2020-11-12 ENCOUNTER — Encounter: Payer: Medicare Other | Admitting: Vascular Surgery

## 2020-11-12 ENCOUNTER — Encounter (HOSPITAL_COMMUNITY): Payer: Medicare Other

## 2020-11-12 ENCOUNTER — Ambulatory Visit (INDEPENDENT_AMBULATORY_CARE_PROVIDER_SITE_OTHER)
Admission: RE | Admit: 2020-11-12 | Discharge: 2020-11-12 | Disposition: A | Discharge status: Home / Self Care | Payer: Medicare Other | Source: Ambulatory Visit | Attending: Vascular Surgery | Admitting: Vascular Surgery

## 2020-11-12 ENCOUNTER — Encounter: Payer: Self-pay | Admitting: Vascular Surgery

## 2020-11-12 ENCOUNTER — Ambulatory Visit (INDEPENDENT_AMBULATORY_CARE_PROVIDER_SITE_OTHER): Payer: Medicare Other | Admitting: Vascular Surgery

## 2020-11-12 ENCOUNTER — Other Ambulatory Visit: Payer: Self-pay

## 2020-11-12 VITALS — BP 133/64 | HR 56 | Temp 97.7°F | Resp 20

## 2020-11-12 DIAGNOSIS — I6523 Occlusion and stenosis of bilateral carotid arteries: Secondary | ICD-10-CM

## 2020-11-12 DIAGNOSIS — L97329 Non-pressure chronic ulcer of left ankle with unspecified severity: Secondary | ICD-10-CM

## 2020-11-12 DIAGNOSIS — I83023 Varicose veins of left lower extremity with ulcer of ankle: Secondary | ICD-10-CM | POA: Diagnosis not present

## 2020-11-12 DIAGNOSIS — I739 Peripheral vascular disease, unspecified: Secondary | ICD-10-CM

## 2020-11-12 DIAGNOSIS — I779 Disorder of arteries and arterioles, unspecified: Secondary | ICD-10-CM | POA: Insufficient documentation

## 2020-11-12 NOTE — Progress Notes (Signed)
ASSESSMENT & PLAN:  Chase [name] is a 86 y.o. male with:   1) asymptomatic bilateral L (40-59%) > R (1-39%) carotid artery stenosis.  The patient should continue best medical therapy for carotid artery stenosis including: Complete cessation from all tobacco products. Blood glucose control with goal A1c < 7%. Blood pressure control with goal blood pressure < 140/90 mmHg. Lipid reduction therapy with goal LDL-C <100 mg/dL (<70 if symptomatic from carotid artery stenosis).  Aspirin 81mg  PO QD.  Atorvastatin 40-80mg  PO QD (or other "high intensity" statin therapy).  2) Venous stasis ulceration. ABI / Toe pressure suggests adequate perfusion for healing. Continue local care to the ulceration as per the wound nurse. Should wound worsen, would recommend Unna boot treatments. Follow up PRN.   CHIEF COMPLAINT:   wound  HISTORY:  HISTORY OF PRESENT ILLNESS: Chase [name] is a 86 y.o. male for to clinic for surveillance of carotid artery stenosis and evaluation of left lower extremity ulceration.  Patient and his wife who is with him today, report a month-long history of ulceration immediately proximal to the left medial malleolus.  This has improved with local wound care over time.  Patient does not give a history of previous ulceration or waxing and waning healing of this wound.  He has no history of focal neurologic deficit.  No history of recent stroke.  Past Medical History:  Diagnosis Date  . Bradycardia 03/15/2012  . Carotid artery occlusion   . Choledocholithiasis 02/25/2018  . CKD (chronic kidney disease) stage 4, GFR 15-29 ml/min (HCC)   . Complete lesion of cervical spinal cord ([place]) 03/14/3012   Stable since 2006  . CVA (cerebrovascular accident) ([place]) 09/07/12   right sided weakness  . Depressive disorder, not elsewhere classified   . Diabetes mellitus approx 1994  . Diabetic neuropathy ([place])   . History of kidney stones   . Hypertensive heart disease   . Hypothyroidism  approx 2000  . Lacunar stroke, acute ([place]) 03/14/2012  . Obesity   . Osteoarthrosis, unspecified whether generalized or localized, lower leg   . Other and unspecified hyperlipidemia   . Peripheral vascular disease, unspecified ([place])   . Seizures ([place])    unknown etiology; on meds, last seizure was 2015  . Spinal stenosis, unspecified region other than cervical   . Spondylosis of unspecified site without mention of myelopathy     Past Surgical History:  Procedure Laterality Date  . BILIARY STENT PLACEMENT N/A 05/27/2018   Procedure: BILIARY STENT PLACEMENT;  Surgeon: Rogene Houston, MD;  [facility];  Service: Endoscopy;  Laterality: N/A;  . COLONOSCOPY N/A 08/10/2013   Procedure: COLONOSCOPY;  Surgeon: Rogene Houston, MD;  [facility];  Service: Endoscopy;  Laterality: N/A;  240  . ERCP N/A 03/02/2018   Procedure: ENDOSCOPIC RETROGRADE CHOLANGIOPANCREATOGRAPHY (ERCP) With sphincterotomy and stent placement;  Surgeon: Rogene Houston, MD;  [facility];  Service: Gastroenterology;  Laterality: N/A;  . ERCP N/A 05/27/2018   Procedure: ENDOSCOPIC RETROGRADE CHOLANGIOPANCREATOGRAPHY (ERCP);  Surgeon: Rogene Houston, MD;  [facility];  Service: Endoscopy;  Laterality: N/A;  . ERCP N/A 09/16/2018   Procedure: ENDOSCOPIC RETROGRADE CHOLANGIOPANCREATOGRAPHY (ERCP);  Surgeon: Rogene Houston, MD;  [facility];  Service: Endoscopy;  Laterality: N/A;  . GASTROINTESTINAL STENT REMOVAL N/A 05/27/2018   Procedure: Biliary STENT REMOVAL;  Surgeon: Rogene Houston, MD;  [facility];  Service: Endoscopy;  Laterality: N/A;  .  GASTROINTESTINAL STENT REMOVAL N/A 09/16/2018   Procedure: GASTROINTESTINAL STENT REMOVAL;  Surgeon: Rogene Houston, MD;  [facility];  Service: Endoscopy;  Laterality: N/A;  . kidney stones left x2  1975  . KIDNEY SURGERY     Ruptured left kidney 30 yrs ago  from a kidney stone  . LITHOTRIPSY N/A  05/27/2018   Procedure: MECHANICAL LITHOTRIPSY;  Surgeon: Rogene Houston, MD;  [facility];  Service: Endoscopy;  Laterality: N/A;  . REMOVAL OF STONES N/A 09/16/2018   Procedure: REMOVAL OF MULTIPLE STONES WITH BASKET AND BALLOON;  Surgeon: Rogene Houston, MD;  [facility];  Service: Endoscopy;  Laterality: N/A;  . SPHINCTEROTOMY N/A 05/27/2018   Procedure: SPHINCTEROTOMY extended;  Surgeon: Rogene Houston, MD;  [facility];  Service: Endoscopy;  Laterality: N/A;  . SPYGLASS CHOLANGIOSCOPY N/A 05/27/2018   Procedure: PYKDXIPJ CHOLANGIOSCOPY;  Surgeon: Rogene Houston, MD;  [facility];  Service: Endoscopy;  Laterality: N/A;    Family History  Problem Relation Age of Onset  . Diabetes Mother   . Prostate cancer Father   . Hypertension Brother   . Hypertension Brother   . Diabetes Brother   . Stroke Brother   . Diabetes Brother   . Diabetes Daughter   . Diabetes Daughter     Social History   Socioeconomic History  . Marital status: Married    Spouse name: Not on file  . Number of children: 5  . Years of education: 8  . Highest education level: 8th grade  Occupational History  . Occupation: retired     Fish farm manager: RETIRED  Tobacco Use  . Smoking status: Never Smoker  . Smokeless tobacco: Never Used  Vaping Use  . Vaping Use: Never used  Substance and Sexual Activity  . Alcohol use: No    Alcohol/week: 0.0 standard drinks  . Drug use: No  . Sexual activity: Not Currently  Other Topics Concern  . Not on file  Social History Narrative  . Not on file   Social Determinants of Health   Financial Resource Strain: Low Risk   . Difficulty of Paying Living Expenses: Not very hard  Food Insecurity: No Food Insecurity  . Worried About Charity fundraiser in the Last Year: Never true  . Ran Out of Food in the Last Year: Never true  Transportation Needs: No Transportation Needs  . Lack of Transportation (Medical): No  . Lack  of Transportation (Non-Medical): No  Physical Activity: Inactive  . Days of Exercise per Week: 0 days  . Minutes of Exercise per Session: 0 min  Stress: No Stress Concern Present  . Feeling of Stress : Not at all  Social Connections: Moderately Integrated  . Frequency of Communication with Friends and Family: Twice a week  . Frequency of Social Gatherings with Friends and Family: Three times a week  . Attends Religious Services: 1 to 4 times per year  . Active Member of Clubs or Organizations: No  . Attends Archivist Meetings: Never  . Marital Status: Married  Human resources officer Violence: Not At Risk  . Fear of Current or Ex-Partner: No  . Emotionally Abused: No  . Physically Abused: No  . Sexually Abused: No    Allergies  Allergen Reactions  . Bayer Advanced Aspirin [Aspirin] Nausea And Vomiting  . Penicillins Nausea And Vomiting    Has patient had a PCN reaction causing immediate rash, facial/tongue/throat swelling, SOB or  lightheadedness with hypotension: unknown Has patient had a PCN reaction causing severe rash involving mucus membranes or skin necrosis: unknown Has patient had a PCN reaction that required hospitalization: unknown Has patient had a PCN reaction occurring within the last 10 years: no If all of the above answers are "NO", then may proceed with Cephalosporin use.    Current Outpatient Medications  Medication Sig Dispense Refill  . amLODipine (NORVASC) 10 MG tablet TAKE 1 TABLET DAILY 90 tablet 3  . calcium carbonate (TUMS - DOSED IN MG ELEMENTAL CALCIUM) 500 MG chewable tablet Chew 1 tablet by mouth 2 (two) times daily. For Bone Health    . clopidogrel (PLAVIX) 75 MG tablet TAKE 1 TABLET DAILY 90 tablet 3  . diphenoxylate-atropine (LOMOTIL) 2.5-0.025 MG tablet Take one tablet by mouth once daily for loose watery stool 20 tablet 1  . HUMULIN 70/30 KWIKPEN (70-30) 100 UNIT/ML KwikPen INJECT 10 UNITS UNDER THE SKIN TWICE A DAY 30 mL 3  . Insulin Pen  Needle (B-D ULTRAFINE III SHORT PEN) 31G X 8 MM MISC 1 each by Does not apply route as directed. 300 each 1  . levETIRAcetam (KEPPRA) 500 MG tablet Take 250 mg by mouth at bedtime.     Marland Kitchen levothyroxine (SYNTHROID) 112 MCG tablet TAKE 1 TABLET DAILY BEFORE BREAKFAST 90 tablet 1  . Multiple Vitamins-Minerals (MULTIVITAMIN WITH MINERALS) tablet Take 1 tablet by mouth daily.    Marland Kitchen olopatadine (PATANOL) 0.1 % ophthalmic solution INSTILL 1 DROP INTO EACH EYE TWICE DAILY 5 mL 12  . OneTouch Delica Lancets 62G MISC 1 each by Other route 2 (two) times daily. 100 each 2  . ONETOUCH ULTRA test strip USE 1 STRIP TWICE A DAY AS INSTRUCTED 300 each 3  . polyethylene glycol powder (GLYCOLAX/MIRALAX) 17 GM/SCOOP powder Take 17 g by mouth daily. 3350 g 3  . pravastatin (PRAVACHOL) 20 MG tablet TAKE 1 TABLET DAILY 90 tablet 3  . tamsulosin (FLOMAX) 0.4 MG CAPS capsule Take 1 capsule (0.4 mg total) by mouth daily. 90 capsule 3  . Vitamin D, Ergocalciferol, (DRISDOL) 1.25 MG (50000 UNIT) CAPS capsule Take 50,000 Units by mouth once a week.    . zinc gluconate 50 MG tablet Take by mouth.     No current facility-administered medications for this visit.    REVIEW OF SYSTEMS:  [X]  denotes positive finding, [ ]  denotes negative finding Cardiac  Comments:  Chest pain or chest pressure:    Shortness of breath upon exertion:    Short of breath when lying flat:    Irregular heart rhythm:        Vascular    Pain in calf, thigh, or hip brought on by ambulation: x   Pain in feet at night that wakes you up from your sleep:  x   Blood clot in your veins:    Leg swelling:  x       Pulmonary    Oxygen at home:    Productive cough:     Wheezing:         Neurologic    Sudden weakness in arms or legs:  x   Sudden numbness in arms or legs:     Sudden onset of difficulty speaking or slurred speech:    Temporary loss of vision in one eye:     Problems with dizziness:         Gastrointestinal    Blood in stool:       Vomited blood:  Genitourinary    Burning when urinating:     Blood in urine:        Psychiatric    Major depression:         Hematologic    Bleeding problems:    Problems with blood clotting too easily:        Skin    Rashes or ulcers: x       Constitutional    Fever or chills:     PHYSICAL EXAM:   Vitals:   11/12/20 1444  BP: 133/64  Pulse: (!) 56  Resp: 20  Temp: 97.7 F (36.5 C)  SpO2: 100%    Constitutional: Elderly man, appearing chronically ill in no distress. Appears well nourished.  Neurologic: Confined to wheelchair. CN intact. No weakness. No sensory loss. Psychiatric: Mood and affect symmetric and appropriate. Eyes: No icterus. No conjunctival pallor. Ears, nose, throat: mucous membranes moist. Midline trachea. No carotid bruit. Cardiac: regular rate and rhythm.  Respiratory: unlabored. Abdominal: soft, non-tender, non-distended. No palpable pulsatile abdominal mass. Peripheral vascular:  Nonpalpable pedal pulses  Left medial malleolus venous stasis ulceration Extremity: No edema. No cyanosis. No pallor.  Skin: No gangrene. No ulceration.  Lymphatic: No Stemmer's sign. No palpable lymphadenopathy.   DATA REVIEW:    Most recent CBC CBC Latest Ref Rng & Units 07/05/2020 10/17/2019 09/14/2018  WBC 3.8 - 10.8 Thousand/uL 6.2 5.8 5.9  Hemoglobin 13.2 - 17.1 g/dL 9.6(L) 9.3(L) 10.5(L)  Hematocrit 38 - 50 % 29.3(L) 28.0(L) 33.9(L)  Platelets 140 - 400 Thousand/uL 224 222 237     Most recent CMP CMP Latest Ref Rng & Units 10/01/2020 07/05/2020 02/07/2020  Glucose 65 - 99 mg/dL 183(H) 82 178(H)  BUN 7 - 25 mg/dL 47(H) 42(H) 40(H)  Creatinine 0.70 - 1.11 mg/dL 3.15(H) 3.23(H) 3.13(H)  Sodium 135 - 146 mmol/L 143 143 139  Potassium 3.5 - 5.3 mmol/L 4.8 4.3 5.2  Chloride 98 - 110 mmol/L 110 109 106  CO2 20 - 32 mmol/L 22 25 25   Calcium 8.6 - 10.3 mg/dL 9.3 9.6 9.6  Total Protein 6.1 - 8.1 g/dL 6.6 7.0 6.7  Total Bilirubin 0.2 - 1.2 mg/dL 0.4 0.4  0.4  Alkaline Phos 38 - 126 U/L - - -  AST 10 - 35 U/L 18 20 20   ALT 9 - 46 U/L 11 11 11     Renal function CrCl cannot be calculated (Patient's most recent lab result is older than the maximum 21 days allowed.).  Hemoglobin A1C  Date Value  10/03/2020 6.3 % (A)  11/27/2015 6   Hgb A1c MFr Bld (% of total Hgb)  Date Value  02/07/2020 5.9 (H)    LDL Cholesterol (Calc)  Date Value Ref Range Status  10/01/2020 75 mg/dL (calc) Final    Comment:    Reference range: <100 . Desirable range <100 mg/dL for primary prevention;   <70 mg/dL for patients with CHD or diabetic patients  with > or = 2 CHD risk factors. Marland Kitchen LDL-C is now calculated using the Martin-Hopkins  calculation, which is a validated novel method providing  better accuracy than the Friedewald equation in the  estimation of LDL-C.  Cresenciano Genre et al. Annamaria Helling. 9381;017(51): 2061-2068  (http://education.[facility].com/faq/FAQ164)      Vascular Imaging: ABI 11/12/20   11/12/20 Carotid Duplex Carotid Arterial Duplex Study   Indications:    Carotid artery disease.  Risk Factors:   Hypertension, Diabetes.  Comparison Study: 05/07/17 B 1-39%   Performing Technologist: June Leap RDMS,  RVT     Examination Guidelines: A complete evaluation includes B-mode imaging,  spectral  Doppler, color Doppler, and power Doppler as needed of all accessible  portions  of each vessel. Bilateral testing is considered an integral part of a  complete  examination. Limited examinations for reoccurring indications may be  performed  as noted.     Right Carotid Findings:  +----------+--------+--------+--------+------------------+--------+       PSV cm/sEDV cm/sStenosisPlaque DescriptionComments  +----------+--------+--------+--------+------------------+--------+  CCA Prox 103   12                      +----------+--------+--------+--------+------------------+--------+  CCA  Distal93   13                      +----------+--------+--------+--------+------------------+--------+  ICA Prox 111   26   1-39%  homogeneous          +----------+--------+--------+--------+------------------+--------+  ICA Mid  106   19                      +----------+--------+--------+--------+------------------+--------+  ICA Distal124   22                      +----------+--------+--------+--------+------------------+--------+  ECA    95   4        homogeneous          +----------+--------+--------+--------+------------------+--------+   +----------+--------+-------+----------------+-------------------+       PSV cm/sEDV cmsDescribe    Arm Pressure (mmHG)  +----------+--------+-------+----------------+-------------------+  [id number]       Multiphasic, WNL            +----------+--------+-------+----------------+-------------------+   +---------+--------+--+--------+--+---------+  VertebralPSV cm/s45EDV cm/s14Antegrade  +---------+--------+--+--------+--+---------+      Left Carotid Findings:  +----------+--------+--------+--------+---------------------------+--------  +       PSV cm/sEDV cm/sStenosisPlaque Description      Comments  +----------+--------+--------+--------+---------------------------+--------  +  CCA Prox 108   21                             +----------+--------+--------+--------+---------------------------+--------  +  CCA Distal91   20                             +----------+--------+--------+--------+---------------------------+--------  +  ICA Prox 176   41   40-59% heterogenous and hypoechoic          +----------+--------+--------+--------+---------------------------+--------  +  ICA Mid  92   23                             +----------+--------+--------+--------+---------------------------+--------  +  ICA Distal95   25                             +----------+--------+--------+--------+---------------------------+--------  +  ECA    120   7                              +----------+--------+--------+--------+---------------------------+--------  +   +----------+--------+--------+----------------+-------------------+       PSV cm/sEDV cm/sDescribe    Arm Pressure (mmHG)  +----------+--------+--------+----------------+-------------------+  [id number]       Multiphasic, WNL            +----------+--------+--------+----------------+-------------------+   +---------+--------+--+--------+--+---------+  VertebralPSV cm/s59EDV cm/s11Antegrade  +---------+--------+--+--------+--+---------+  Summary:  Right Carotid: Velocities in the right ICA are consistent with a 1-39%  stenosis.   Left Carotid: Velocities in the left ICA are consistent with a 40-59%  stenosis.   Yevonne Aline. Stanford Breed, MD [facility] Phone Number: [phone] 11/12/2020 2:43 PM

## 2020-11-13 DIAGNOSIS — R531 Weakness: Secondary | ICD-10-CM | POA: Diagnosis not present

## 2020-11-13 DIAGNOSIS — E039 Hypothyroidism, unspecified: Secondary | ICD-10-CM | POA: Diagnosis not present

## 2020-11-13 DIAGNOSIS — E1151 Type 2 diabetes mellitus with diabetic peripheral angiopathy without gangrene: Secondary | ICD-10-CM | POA: Diagnosis not present

## 2020-11-13 DIAGNOSIS — L97321 Non-pressure chronic ulcer of left ankle limited to breakdown of skin: Secondary | ICD-10-CM | POA: Diagnosis not present

## 2020-11-13 DIAGNOSIS — Z4800 Encounter for change or removal of nonsurgical wound dressing: Secondary | ICD-10-CM | POA: Diagnosis not present

## 2020-11-13 DIAGNOSIS — I83023 Varicose veins of left lower extremity with ulcer of ankle: Secondary | ICD-10-CM | POA: Diagnosis not present

## 2020-11-15 DIAGNOSIS — E11622 Type 2 diabetes mellitus with other skin ulcer: Secondary | ICD-10-CM | POA: Diagnosis not present

## 2020-11-15 DIAGNOSIS — L97329 Non-pressure chronic ulcer of left ankle with unspecified severity: Secondary | ICD-10-CM | POA: Diagnosis not present

## 2020-11-15 DIAGNOSIS — L97321 Non-pressure chronic ulcer of left ankle limited to breakdown of skin: Secondary | ICD-10-CM | POA: Diagnosis not present

## 2020-11-15 DIAGNOSIS — I70243 Atherosclerosis of native arteries of left leg with ulceration of ankle: Secondary | ICD-10-CM | POA: Diagnosis not present

## 2020-11-15 DIAGNOSIS — Z79890 Hormone replacement therapy (postmenopausal): Secondary | ICD-10-CM | POA: Diagnosis not present

## 2020-11-15 DIAGNOSIS — E1151 Type 2 diabetes mellitus with diabetic peripheral angiopathy without gangrene: Secondary | ICD-10-CM | POA: Diagnosis not present

## 2020-11-15 DIAGNOSIS — I10 Essential (primary) hypertension: Secondary | ICD-10-CM | POA: Diagnosis not present

## 2020-11-15 DIAGNOSIS — E039 Hypothyroidism, unspecified: Secondary | ICD-10-CM | POA: Diagnosis not present

## 2020-11-18 DIAGNOSIS — Z4800 Encounter for change or removal of nonsurgical wound dressing: Secondary | ICD-10-CM | POA: Diagnosis not present

## 2020-11-18 DIAGNOSIS — E039 Hypothyroidism, unspecified: Secondary | ICD-10-CM | POA: Diagnosis not present

## 2020-11-18 DIAGNOSIS — I83023 Varicose veins of left lower extremity with ulcer of ankle: Secondary | ICD-10-CM | POA: Diagnosis not present

## 2020-11-18 DIAGNOSIS — R531 Weakness: Secondary | ICD-10-CM | POA: Diagnosis not present

## 2020-11-18 DIAGNOSIS — E1151 Type 2 diabetes mellitus with diabetic peripheral angiopathy without gangrene: Secondary | ICD-10-CM | POA: Diagnosis not present

## 2020-11-18 DIAGNOSIS — L97321 Non-pressure chronic ulcer of left ankle limited to breakdown of skin: Secondary | ICD-10-CM | POA: Diagnosis not present

## 2020-11-19 ENCOUNTER — Telehealth: Payer: Self-pay

## 2020-11-19 DIAGNOSIS — I83023 Varicose veins of left lower extremity with ulcer of ankle: Secondary | ICD-10-CM | POA: Diagnosis not present

## 2020-11-19 DIAGNOSIS — R531 Weakness: Secondary | ICD-10-CM | POA: Diagnosis not present

## 2020-11-19 DIAGNOSIS — L97321 Non-pressure chronic ulcer of left ankle limited to breakdown of skin: Secondary | ICD-10-CM | POA: Diagnosis not present

## 2020-11-19 DIAGNOSIS — E1151 Type 2 diabetes mellitus with diabetic peripheral angiopathy without gangrene: Secondary | ICD-10-CM | POA: Diagnosis not present

## 2020-11-19 DIAGNOSIS — Z4800 Encounter for change or removal of nonsurgical wound dressing: Secondary | ICD-10-CM | POA: Diagnosis not present

## 2020-11-19 DIAGNOSIS — E039 Hypothyroidism, unspecified: Secondary | ICD-10-CM | POA: Diagnosis not present

## 2020-11-19 NOTE — Telephone Encounter (Signed)
Danny w/ [facility] calling to get verbal orders for PT. Called him back and gave permission for the patients PT

## 2020-11-20 DIAGNOSIS — E1151 Type 2 diabetes mellitus with diabetic peripheral angiopathy without gangrene: Secondary | ICD-10-CM | POA: Diagnosis not present

## 2020-11-20 DIAGNOSIS — E039 Hypothyroidism, unspecified: Secondary | ICD-10-CM | POA: Diagnosis not present

## 2020-11-20 DIAGNOSIS — Z9181 History of falling: Secondary | ICD-10-CM | POA: Diagnosis not present

## 2020-11-20 DIAGNOSIS — Z4800 Encounter for change or removal of nonsurgical wound dressing: Secondary | ICD-10-CM | POA: Diagnosis not present

## 2020-11-20 DIAGNOSIS — R531 Weakness: Secondary | ICD-10-CM | POA: Diagnosis not present

## 2020-11-20 DIAGNOSIS — I83023 Varicose veins of left lower extremity with ulcer of ankle: Secondary | ICD-10-CM | POA: Diagnosis not present

## 2020-11-20 DIAGNOSIS — E785 Hyperlipidemia, unspecified: Secondary | ICD-10-CM | POA: Diagnosis not present

## 2020-11-20 DIAGNOSIS — Z7902 Long term (current) use of antithrombotics/antiplatelets: Secondary | ICD-10-CM | POA: Diagnosis not present

## 2020-11-20 DIAGNOSIS — I10 Essential (primary) hypertension: Secondary | ICD-10-CM | POA: Diagnosis not present

## 2020-11-20 DIAGNOSIS — L97321 Non-pressure chronic ulcer of left ankle limited to breakdown of skin: Secondary | ICD-10-CM | POA: Diagnosis not present

## 2020-11-20 DIAGNOSIS — Z794 Long term (current) use of insulin: Secondary | ICD-10-CM | POA: Diagnosis not present

## 2020-11-22 ENCOUNTER — Ambulatory Visit: Payer: Medicare Other | Admitting: Vascular Surgery

## 2020-11-22 ENCOUNTER — Encounter (HOSPITAL_COMMUNITY): Payer: Medicare Other

## 2020-11-22 DIAGNOSIS — Z4800 Encounter for change or removal of nonsurgical wound dressing: Secondary | ICD-10-CM | POA: Diagnosis not present

## 2020-11-22 DIAGNOSIS — L97321 Non-pressure chronic ulcer of left ankle limited to breakdown of skin: Secondary | ICD-10-CM | POA: Diagnosis not present

## 2020-11-22 DIAGNOSIS — I83023 Varicose veins of left lower extremity with ulcer of ankle: Secondary | ICD-10-CM | POA: Diagnosis not present

## 2020-11-22 DIAGNOSIS — R531 Weakness: Secondary | ICD-10-CM | POA: Diagnosis not present

## 2020-11-22 DIAGNOSIS — E039 Hypothyroidism, unspecified: Secondary | ICD-10-CM | POA: Diagnosis not present

## 2020-11-22 DIAGNOSIS — E1151 Type 2 diabetes mellitus with diabetic peripheral angiopathy without gangrene: Secondary | ICD-10-CM | POA: Diagnosis not present

## 2020-11-25 DIAGNOSIS — Z4800 Encounter for change or removal of nonsurgical wound dressing: Secondary | ICD-10-CM | POA: Diagnosis not present

## 2020-11-25 DIAGNOSIS — R531 Weakness: Secondary | ICD-10-CM | POA: Diagnosis not present

## 2020-11-25 DIAGNOSIS — I83023 Varicose veins of left lower extremity with ulcer of ankle: Secondary | ICD-10-CM | POA: Diagnosis not present

## 2020-11-25 DIAGNOSIS — E1151 Type 2 diabetes mellitus with diabetic peripheral angiopathy without gangrene: Secondary | ICD-10-CM | POA: Diagnosis not present

## 2020-11-25 DIAGNOSIS — L97321 Non-pressure chronic ulcer of left ankle limited to breakdown of skin: Secondary | ICD-10-CM | POA: Diagnosis not present

## 2020-11-25 DIAGNOSIS — E039 Hypothyroidism, unspecified: Secondary | ICD-10-CM | POA: Diagnosis not present

## 2020-11-26 DIAGNOSIS — I83023 Varicose veins of left lower extremity with ulcer of ankle: Secondary | ICD-10-CM | POA: Diagnosis not present

## 2020-11-26 DIAGNOSIS — E039 Hypothyroidism, unspecified: Secondary | ICD-10-CM | POA: Diagnosis not present

## 2020-11-26 DIAGNOSIS — R531 Weakness: Secondary | ICD-10-CM | POA: Diagnosis not present

## 2020-11-26 DIAGNOSIS — L97321 Non-pressure chronic ulcer of left ankle limited to breakdown of skin: Secondary | ICD-10-CM | POA: Diagnosis not present

## 2020-11-26 DIAGNOSIS — Z4800 Encounter for change or removal of nonsurgical wound dressing: Secondary | ICD-10-CM | POA: Diagnosis not present

## 2020-11-26 DIAGNOSIS — E1151 Type 2 diabetes mellitus with diabetic peripheral angiopathy without gangrene: Secondary | ICD-10-CM | POA: Diagnosis not present

## 2020-11-27 DIAGNOSIS — R531 Weakness: Secondary | ICD-10-CM | POA: Diagnosis not present

## 2020-11-27 DIAGNOSIS — Z794 Long term (current) use of insulin: Secondary | ICD-10-CM | POA: Diagnosis not present

## 2020-11-27 DIAGNOSIS — L97321 Non-pressure chronic ulcer of left ankle limited to breakdown of skin: Secondary | ICD-10-CM | POA: Diagnosis not present

## 2020-11-27 DIAGNOSIS — E785 Hyperlipidemia, unspecified: Secondary | ICD-10-CM | POA: Diagnosis not present

## 2020-11-27 DIAGNOSIS — E039 Hypothyroidism, unspecified: Secondary | ICD-10-CM | POA: Diagnosis not present

## 2020-11-27 DIAGNOSIS — I83023 Varicose veins of left lower extremity with ulcer of ankle: Secondary | ICD-10-CM | POA: Diagnosis not present

## 2020-11-27 DIAGNOSIS — I10 Essential (primary) hypertension: Secondary | ICD-10-CM | POA: Diagnosis not present

## 2020-11-27 DIAGNOSIS — E1151 Type 2 diabetes mellitus with diabetic peripheral angiopathy without gangrene: Secondary | ICD-10-CM | POA: Diagnosis not present

## 2020-11-27 DIAGNOSIS — Z7902 Long term (current) use of antithrombotics/antiplatelets: Secondary | ICD-10-CM | POA: Diagnosis not present

## 2020-11-27 DIAGNOSIS — Z4800 Encounter for change or removal of nonsurgical wound dressing: Secondary | ICD-10-CM | POA: Diagnosis not present

## 2020-11-27 DIAGNOSIS — Z9181 History of falling: Secondary | ICD-10-CM | POA: Diagnosis not present

## 2020-11-28 DIAGNOSIS — Z4800 Encounter for change or removal of nonsurgical wound dressing: Secondary | ICD-10-CM | POA: Diagnosis not present

## 2020-11-28 DIAGNOSIS — E1151 Type 2 diabetes mellitus with diabetic peripheral angiopathy without gangrene: Secondary | ICD-10-CM | POA: Diagnosis not present

## 2020-11-28 DIAGNOSIS — L97321 Non-pressure chronic ulcer of left ankle limited to breakdown of skin: Secondary | ICD-10-CM | POA: Diagnosis not present

## 2020-11-28 DIAGNOSIS — E039 Hypothyroidism, unspecified: Secondary | ICD-10-CM | POA: Diagnosis not present

## 2020-11-28 DIAGNOSIS — I83023 Varicose veins of left lower extremity with ulcer of ankle: Secondary | ICD-10-CM | POA: Diagnosis not present

## 2020-11-28 DIAGNOSIS — R531 Weakness: Secondary | ICD-10-CM | POA: Diagnosis not present

## 2020-11-29 DIAGNOSIS — Z4800 Encounter for change or removal of nonsurgical wound dressing: Secondary | ICD-10-CM | POA: Diagnosis not present

## 2020-11-29 DIAGNOSIS — E039 Hypothyroidism, unspecified: Secondary | ICD-10-CM | POA: Diagnosis not present

## 2020-11-29 DIAGNOSIS — R531 Weakness: Secondary | ICD-10-CM | POA: Diagnosis not present

## 2020-11-29 DIAGNOSIS — L97321 Non-pressure chronic ulcer of left ankle limited to breakdown of skin: Secondary | ICD-10-CM | POA: Diagnosis not present

## 2020-11-29 DIAGNOSIS — I83023 Varicose veins of left lower extremity with ulcer of ankle: Secondary | ICD-10-CM | POA: Diagnosis not present

## 2020-11-29 DIAGNOSIS — E1151 Type 2 diabetes mellitus with diabetic peripheral angiopathy without gangrene: Secondary | ICD-10-CM | POA: Diagnosis not present

## 2020-12-02 DIAGNOSIS — E039 Hypothyroidism, unspecified: Secondary | ICD-10-CM | POA: Diagnosis not present

## 2020-12-02 DIAGNOSIS — L97321 Non-pressure chronic ulcer of left ankle limited to breakdown of skin: Secondary | ICD-10-CM | POA: Diagnosis not present

## 2020-12-02 DIAGNOSIS — I83023 Varicose veins of left lower extremity with ulcer of ankle: Secondary | ICD-10-CM | POA: Diagnosis not present

## 2020-12-02 DIAGNOSIS — Z4800 Encounter for change or removal of nonsurgical wound dressing: Secondary | ICD-10-CM | POA: Diagnosis not present

## 2020-12-02 DIAGNOSIS — E1151 Type 2 diabetes mellitus with diabetic peripheral angiopathy without gangrene: Secondary | ICD-10-CM | POA: Diagnosis not present

## 2020-12-02 DIAGNOSIS — R531 Weakness: Secondary | ICD-10-CM | POA: Diagnosis not present

## 2020-12-04 DIAGNOSIS — R531 Weakness: Secondary | ICD-10-CM | POA: Diagnosis not present

## 2020-12-04 DIAGNOSIS — E1151 Type 2 diabetes mellitus with diabetic peripheral angiopathy without gangrene: Secondary | ICD-10-CM | POA: Diagnosis not present

## 2020-12-04 DIAGNOSIS — I83023 Varicose veins of left lower extremity with ulcer of ankle: Secondary | ICD-10-CM | POA: Diagnosis not present

## 2020-12-04 DIAGNOSIS — Z4800 Encounter for change or removal of nonsurgical wound dressing: Secondary | ICD-10-CM | POA: Diagnosis not present

## 2020-12-04 DIAGNOSIS — L97321 Non-pressure chronic ulcer of left ankle limited to breakdown of skin: Secondary | ICD-10-CM | POA: Diagnosis not present

## 2020-12-04 DIAGNOSIS — E039 Hypothyroidism, unspecified: Secondary | ICD-10-CM | POA: Diagnosis not present

## 2020-12-06 DIAGNOSIS — I83023 Varicose veins of left lower extremity with ulcer of ankle: Secondary | ICD-10-CM | POA: Diagnosis not present

## 2020-12-06 DIAGNOSIS — E1151 Type 2 diabetes mellitus with diabetic peripheral angiopathy without gangrene: Secondary | ICD-10-CM | POA: Diagnosis not present

## 2020-12-06 DIAGNOSIS — L97321 Non-pressure chronic ulcer of left ankle limited to breakdown of skin: Secondary | ICD-10-CM | POA: Diagnosis not present

## 2020-12-06 DIAGNOSIS — R531 Weakness: Secondary | ICD-10-CM | POA: Diagnosis not present

## 2020-12-06 DIAGNOSIS — Z4800 Encounter for change or removal of nonsurgical wound dressing: Secondary | ICD-10-CM | POA: Diagnosis not present

## 2020-12-06 DIAGNOSIS — E039 Hypothyroidism, unspecified: Secondary | ICD-10-CM | POA: Diagnosis not present

## 2020-12-08 DIAGNOSIS — Z9181 History of falling: Secondary | ICD-10-CM | POA: Diagnosis not present

## 2020-12-08 DIAGNOSIS — Z4800 Encounter for change or removal of nonsurgical wound dressing: Secondary | ICD-10-CM | POA: Diagnosis not present

## 2020-12-08 DIAGNOSIS — Z7902 Long term (current) use of antithrombotics/antiplatelets: Secondary | ICD-10-CM | POA: Diagnosis not present

## 2020-12-08 DIAGNOSIS — E1151 Type 2 diabetes mellitus with diabetic peripheral angiopathy without gangrene: Secondary | ICD-10-CM | POA: Diagnosis not present

## 2020-12-08 DIAGNOSIS — E039 Hypothyroidism, unspecified: Secondary | ICD-10-CM | POA: Diagnosis not present

## 2020-12-08 DIAGNOSIS — E785 Hyperlipidemia, unspecified: Secondary | ICD-10-CM | POA: Diagnosis not present

## 2020-12-08 DIAGNOSIS — R531 Weakness: Secondary | ICD-10-CM | POA: Diagnosis not present

## 2020-12-08 DIAGNOSIS — I83023 Varicose veins of left lower extremity with ulcer of ankle: Secondary | ICD-10-CM | POA: Diagnosis not present

## 2020-12-08 DIAGNOSIS — Z794 Long term (current) use of insulin: Secondary | ICD-10-CM | POA: Diagnosis not present

## 2020-12-08 DIAGNOSIS — I10 Essential (primary) hypertension: Secondary | ICD-10-CM | POA: Diagnosis not present

## 2020-12-08 DIAGNOSIS — L97321 Non-pressure chronic ulcer of left ankle limited to breakdown of skin: Secondary | ICD-10-CM | POA: Diagnosis not present

## 2020-12-09 DIAGNOSIS — L97321 Non-pressure chronic ulcer of left ankle limited to breakdown of skin: Secondary | ICD-10-CM | POA: Diagnosis not present

## 2020-12-09 DIAGNOSIS — E1151 Type 2 diabetes mellitus with diabetic peripheral angiopathy without gangrene: Secondary | ICD-10-CM | POA: Diagnosis not present

## 2020-12-09 DIAGNOSIS — E039 Hypothyroidism, unspecified: Secondary | ICD-10-CM | POA: Diagnosis not present

## 2020-12-09 DIAGNOSIS — I83023 Varicose veins of left lower extremity with ulcer of ankle: Secondary | ICD-10-CM | POA: Diagnosis not present

## 2020-12-09 DIAGNOSIS — Z4800 Encounter for change or removal of nonsurgical wound dressing: Secondary | ICD-10-CM | POA: Diagnosis not present

## 2020-12-09 DIAGNOSIS — R531 Weakness: Secondary | ICD-10-CM | POA: Diagnosis not present

## 2020-12-10 DIAGNOSIS — E039 Hypothyroidism, unspecified: Secondary | ICD-10-CM | POA: Diagnosis not present

## 2020-12-10 DIAGNOSIS — I83023 Varicose veins of left lower extremity with ulcer of ankle: Secondary | ICD-10-CM | POA: Diagnosis not present

## 2020-12-10 DIAGNOSIS — R531 Weakness: Secondary | ICD-10-CM | POA: Diagnosis not present

## 2020-12-10 DIAGNOSIS — E1151 Type 2 diabetes mellitus with diabetic peripheral angiopathy without gangrene: Secondary | ICD-10-CM | POA: Diagnosis not present

## 2020-12-10 DIAGNOSIS — L97321 Non-pressure chronic ulcer of left ankle limited to breakdown of skin: Secondary | ICD-10-CM | POA: Diagnosis not present

## 2020-12-10 DIAGNOSIS — Z4800 Encounter for change or removal of nonsurgical wound dressing: Secondary | ICD-10-CM | POA: Diagnosis not present

## 2020-12-11 DIAGNOSIS — L97321 Non-pressure chronic ulcer of left ankle limited to breakdown of skin: Secondary | ICD-10-CM | POA: Diagnosis not present

## 2020-12-11 DIAGNOSIS — E039 Hypothyroidism, unspecified: Secondary | ICD-10-CM | POA: Diagnosis not present

## 2020-12-11 DIAGNOSIS — I83023 Varicose veins of left lower extremity with ulcer of ankle: Secondary | ICD-10-CM | POA: Diagnosis not present

## 2020-12-11 DIAGNOSIS — Z4800 Encounter for change or removal of nonsurgical wound dressing: Secondary | ICD-10-CM | POA: Diagnosis not present

## 2020-12-11 DIAGNOSIS — E1151 Type 2 diabetes mellitus with diabetic peripheral angiopathy without gangrene: Secondary | ICD-10-CM | POA: Diagnosis not present

## 2020-12-11 DIAGNOSIS — R531 Weakness: Secondary | ICD-10-CM | POA: Diagnosis not present

## 2020-12-12 DIAGNOSIS — L97321 Non-pressure chronic ulcer of left ankle limited to breakdown of skin: Secondary | ICD-10-CM | POA: Diagnosis not present

## 2020-12-12 DIAGNOSIS — E1151 Type 2 diabetes mellitus with diabetic peripheral angiopathy without gangrene: Secondary | ICD-10-CM | POA: Diagnosis not present

## 2020-12-12 DIAGNOSIS — R531 Weakness: Secondary | ICD-10-CM | POA: Diagnosis not present

## 2020-12-12 DIAGNOSIS — I83023 Varicose veins of left lower extremity with ulcer of ankle: Secondary | ICD-10-CM | POA: Diagnosis not present

## 2020-12-12 DIAGNOSIS — E039 Hypothyroidism, unspecified: Secondary | ICD-10-CM | POA: Diagnosis not present

## 2020-12-12 DIAGNOSIS — Z4800 Encounter for change or removal of nonsurgical wound dressing: Secondary | ICD-10-CM | POA: Diagnosis not present

## 2020-12-13 DIAGNOSIS — L97321 Non-pressure chronic ulcer of left ankle limited to breakdown of skin: Secondary | ICD-10-CM | POA: Diagnosis not present

## 2020-12-13 DIAGNOSIS — I83023 Varicose veins of left lower extremity with ulcer of ankle: Secondary | ICD-10-CM | POA: Diagnosis not present

## 2020-12-13 DIAGNOSIS — Z4800 Encounter for change or removal of nonsurgical wound dressing: Secondary | ICD-10-CM | POA: Diagnosis not present

## 2020-12-13 DIAGNOSIS — E1151 Type 2 diabetes mellitus with diabetic peripheral angiopathy without gangrene: Secondary | ICD-10-CM | POA: Diagnosis not present

## 2020-12-13 DIAGNOSIS — E039 Hypothyroidism, unspecified: Secondary | ICD-10-CM | POA: Diagnosis not present

## 2020-12-13 DIAGNOSIS — R531 Weakness: Secondary | ICD-10-CM | POA: Diagnosis not present

## 2020-12-16 DIAGNOSIS — L97321 Non-pressure chronic ulcer of left ankle limited to breakdown of skin: Secondary | ICD-10-CM | POA: Diagnosis not present

## 2020-12-16 DIAGNOSIS — I83023 Varicose veins of left lower extremity with ulcer of ankle: Secondary | ICD-10-CM | POA: Diagnosis not present

## 2020-12-16 DIAGNOSIS — E039 Hypothyroidism, unspecified: Secondary | ICD-10-CM | POA: Diagnosis not present

## 2020-12-16 DIAGNOSIS — Z4800 Encounter for change or removal of nonsurgical wound dressing: Secondary | ICD-10-CM | POA: Diagnosis not present

## 2020-12-16 DIAGNOSIS — R531 Weakness: Secondary | ICD-10-CM | POA: Diagnosis not present

## 2020-12-16 DIAGNOSIS — E1151 Type 2 diabetes mellitus with diabetic peripheral angiopathy without gangrene: Secondary | ICD-10-CM | POA: Diagnosis not present

## 2020-12-17 DIAGNOSIS — I10 Essential (primary) hypertension: Secondary | ICD-10-CM | POA: Diagnosis not present

## 2020-12-17 DIAGNOSIS — R531 Weakness: Secondary | ICD-10-CM | POA: Diagnosis not present

## 2020-12-17 DIAGNOSIS — Z880 Allergy status to penicillin: Secondary | ICD-10-CM | POA: Diagnosis not present

## 2020-12-17 DIAGNOSIS — I878 Other specified disorders of veins: Secondary | ICD-10-CM | POA: Diagnosis not present

## 2020-12-17 DIAGNOSIS — Z4800 Encounter for change or removal of nonsurgical wound dressing: Secondary | ICD-10-CM | POA: Diagnosis not present

## 2020-12-17 DIAGNOSIS — E11622 Type 2 diabetes mellitus with other skin ulcer: Secondary | ICD-10-CM | POA: Diagnosis not present

## 2020-12-17 DIAGNOSIS — L97309 Non-pressure chronic ulcer of unspecified ankle with unspecified severity: Secondary | ICD-10-CM | POA: Diagnosis not present

## 2020-12-17 DIAGNOSIS — E1151 Type 2 diabetes mellitus with diabetic peripheral angiopathy without gangrene: Secondary | ICD-10-CM | POA: Diagnosis not present

## 2020-12-17 DIAGNOSIS — Z7902 Long term (current) use of antithrombotics/antiplatelets: Secondary | ICD-10-CM | POA: Diagnosis not present

## 2020-12-17 DIAGNOSIS — I83023 Varicose veins of left lower extremity with ulcer of ankle: Secondary | ICD-10-CM | POA: Diagnosis not present

## 2020-12-17 DIAGNOSIS — I70202 Unspecified atherosclerosis of native arteries of extremities, left leg: Secondary | ICD-10-CM | POA: Diagnosis not present

## 2020-12-17 DIAGNOSIS — L97321 Non-pressure chronic ulcer of left ankle limited to breakdown of skin: Secondary | ICD-10-CM | POA: Diagnosis not present

## 2020-12-17 DIAGNOSIS — E039 Hypothyroidism, unspecified: Secondary | ICD-10-CM | POA: Diagnosis not present

## 2020-12-17 DIAGNOSIS — Z79899 Other long term (current) drug therapy: Secondary | ICD-10-CM | POA: Diagnosis not present

## 2020-12-17 DIAGNOSIS — E785 Hyperlipidemia, unspecified: Secondary | ICD-10-CM | POA: Diagnosis not present

## 2020-12-18 DIAGNOSIS — L97321 Non-pressure chronic ulcer of left ankle limited to breakdown of skin: Secondary | ICD-10-CM | POA: Diagnosis not present

## 2020-12-18 DIAGNOSIS — E039 Hypothyroidism, unspecified: Secondary | ICD-10-CM | POA: Diagnosis not present

## 2020-12-18 DIAGNOSIS — E1151 Type 2 diabetes mellitus with diabetic peripheral angiopathy without gangrene: Secondary | ICD-10-CM | POA: Diagnosis not present

## 2020-12-18 DIAGNOSIS — Z4800 Encounter for change or removal of nonsurgical wound dressing: Secondary | ICD-10-CM | POA: Diagnosis not present

## 2020-12-18 DIAGNOSIS — I83023 Varicose veins of left lower extremity with ulcer of ankle: Secondary | ICD-10-CM | POA: Diagnosis not present

## 2020-12-18 DIAGNOSIS — R531 Weakness: Secondary | ICD-10-CM | POA: Diagnosis not present

## 2020-12-19 DIAGNOSIS — R531 Weakness: Secondary | ICD-10-CM | POA: Diagnosis not present

## 2020-12-19 DIAGNOSIS — L97321 Non-pressure chronic ulcer of left ankle limited to breakdown of skin: Secondary | ICD-10-CM | POA: Diagnosis not present

## 2020-12-19 DIAGNOSIS — E1151 Type 2 diabetes mellitus with diabetic peripheral angiopathy without gangrene: Secondary | ICD-10-CM | POA: Diagnosis not present

## 2020-12-19 DIAGNOSIS — E039 Hypothyroidism, unspecified: Secondary | ICD-10-CM | POA: Diagnosis not present

## 2020-12-19 DIAGNOSIS — Z4800 Encounter for change or removal of nonsurgical wound dressing: Secondary | ICD-10-CM | POA: Diagnosis not present

## 2020-12-19 DIAGNOSIS — I83023 Varicose veins of left lower extremity with ulcer of ankle: Secondary | ICD-10-CM | POA: Diagnosis not present

## 2020-12-20 DIAGNOSIS — L97321 Non-pressure chronic ulcer of left ankle limited to breakdown of skin: Secondary | ICD-10-CM | POA: Diagnosis not present

## 2020-12-20 DIAGNOSIS — E039 Hypothyroidism, unspecified: Secondary | ICD-10-CM | POA: Diagnosis not present

## 2020-12-20 DIAGNOSIS — R531 Weakness: Secondary | ICD-10-CM | POA: Diagnosis not present

## 2020-12-20 DIAGNOSIS — Z4800 Encounter for change or removal of nonsurgical wound dressing: Secondary | ICD-10-CM | POA: Diagnosis not present

## 2020-12-20 DIAGNOSIS — I83023 Varicose veins of left lower extremity with ulcer of ankle: Secondary | ICD-10-CM | POA: Diagnosis not present

## 2020-12-20 DIAGNOSIS — E1151 Type 2 diabetes mellitus with diabetic peripheral angiopathy without gangrene: Secondary | ICD-10-CM | POA: Diagnosis not present

## 2020-12-23 DIAGNOSIS — Z4800 Encounter for change or removal of nonsurgical wound dressing: Secondary | ICD-10-CM | POA: Diagnosis not present

## 2020-12-23 DIAGNOSIS — E1151 Type 2 diabetes mellitus with diabetic peripheral angiopathy without gangrene: Secondary | ICD-10-CM | POA: Diagnosis not present

## 2020-12-23 DIAGNOSIS — E039 Hypothyroidism, unspecified: Secondary | ICD-10-CM | POA: Diagnosis not present

## 2020-12-23 DIAGNOSIS — L97321 Non-pressure chronic ulcer of left ankle limited to breakdown of skin: Secondary | ICD-10-CM | POA: Diagnosis not present

## 2020-12-23 DIAGNOSIS — R531 Weakness: Secondary | ICD-10-CM | POA: Diagnosis not present

## 2020-12-23 DIAGNOSIS — I83023 Varicose veins of left lower extremity with ulcer of ankle: Secondary | ICD-10-CM | POA: Diagnosis not present

## 2020-12-25 DIAGNOSIS — Z4800 Encounter for change or removal of nonsurgical wound dressing: Secondary | ICD-10-CM | POA: Diagnosis not present

## 2020-12-25 DIAGNOSIS — E039 Hypothyroidism, unspecified: Secondary | ICD-10-CM | POA: Diagnosis not present

## 2020-12-25 DIAGNOSIS — I83023 Varicose veins of left lower extremity with ulcer of ankle: Secondary | ICD-10-CM | POA: Diagnosis not present

## 2020-12-25 DIAGNOSIS — E1151 Type 2 diabetes mellitus with diabetic peripheral angiopathy without gangrene: Secondary | ICD-10-CM | POA: Diagnosis not present

## 2020-12-25 DIAGNOSIS — L97321 Non-pressure chronic ulcer of left ankle limited to breakdown of skin: Secondary | ICD-10-CM | POA: Diagnosis not present

## 2020-12-25 DIAGNOSIS — R531 Weakness: Secondary | ICD-10-CM | POA: Diagnosis not present

## 2020-12-26 DIAGNOSIS — I83023 Varicose veins of left lower extremity with ulcer of ankle: Secondary | ICD-10-CM | POA: Diagnosis not present

## 2020-12-26 DIAGNOSIS — E039 Hypothyroidism, unspecified: Secondary | ICD-10-CM | POA: Diagnosis not present

## 2020-12-26 DIAGNOSIS — R531 Weakness: Secondary | ICD-10-CM | POA: Diagnosis not present

## 2020-12-26 DIAGNOSIS — Z4800 Encounter for change or removal of nonsurgical wound dressing: Secondary | ICD-10-CM | POA: Diagnosis not present

## 2020-12-26 DIAGNOSIS — L97321 Non-pressure chronic ulcer of left ankle limited to breakdown of skin: Secondary | ICD-10-CM | POA: Diagnosis not present

## 2020-12-26 DIAGNOSIS — E1151 Type 2 diabetes mellitus with diabetic peripheral angiopathy without gangrene: Secondary | ICD-10-CM | POA: Diagnosis not present

## 2020-12-27 ENCOUNTER — Telehealth: Payer: Self-pay

## 2020-12-27 DIAGNOSIS — I83023 Varicose veins of left lower extremity with ulcer of ankle: Secondary | ICD-10-CM | POA: Diagnosis not present

## 2020-12-27 DIAGNOSIS — E039 Hypothyroidism, unspecified: Secondary | ICD-10-CM | POA: Diagnosis not present

## 2020-12-27 DIAGNOSIS — Z4800 Encounter for change or removal of nonsurgical wound dressing: Secondary | ICD-10-CM | POA: Diagnosis not present

## 2020-12-27 DIAGNOSIS — L97321 Non-pressure chronic ulcer of left ankle limited to breakdown of skin: Secondary | ICD-10-CM | POA: Diagnosis not present

## 2020-12-27 DIAGNOSIS — R531 Weakness: Secondary | ICD-10-CM | POA: Diagnosis not present

## 2020-12-27 DIAGNOSIS — E1151 Type 2 diabetes mellitus with diabetic peripheral angiopathy without gangrene: Secondary | ICD-10-CM | POA: Diagnosis not present

## 2020-12-27 NOTE — Telephone Encounter (Signed)
Left message w/ [facility].

## 2020-12-27 NOTE — Telephone Encounter (Signed)
I am only placing this in the chart, I did not receive the msg, pulling from [place] note that verbal order is needed

## 2020-12-30 DIAGNOSIS — E1151 Type 2 diabetes mellitus with diabetic peripheral angiopathy without gangrene: Secondary | ICD-10-CM | POA: Diagnosis not present

## 2020-12-30 DIAGNOSIS — I83023 Varicose veins of left lower extremity with ulcer of ankle: Secondary | ICD-10-CM | POA: Diagnosis not present

## 2020-12-30 DIAGNOSIS — L97321 Non-pressure chronic ulcer of left ankle limited to breakdown of skin: Secondary | ICD-10-CM | POA: Diagnosis not present

## 2020-12-30 DIAGNOSIS — R531 Weakness: Secondary | ICD-10-CM | POA: Diagnosis not present

## 2020-12-30 DIAGNOSIS — Z4800 Encounter for change or removal of nonsurgical wound dressing: Secondary | ICD-10-CM | POA: Diagnosis not present

## 2020-12-30 DIAGNOSIS — E039 Hypothyroidism, unspecified: Secondary | ICD-10-CM | POA: Diagnosis not present

## 2020-12-31 ENCOUNTER — Other Ambulatory Visit: Payer: Self-pay | Admitting: "Endocrinology

## 2020-12-31 DIAGNOSIS — I70243 Atherosclerosis of native arteries of left leg with ulceration of ankle: Secondary | ICD-10-CM | POA: Diagnosis not present

## 2020-12-31 DIAGNOSIS — E11622 Type 2 diabetes mellitus with other skin ulcer: Secondary | ICD-10-CM | POA: Diagnosis not present

## 2020-12-31 DIAGNOSIS — R600 Localized edema: Secondary | ICD-10-CM | POA: Diagnosis not present

## 2020-12-31 DIAGNOSIS — Z880 Allergy status to penicillin: Secondary | ICD-10-CM | POA: Diagnosis not present

## 2020-12-31 DIAGNOSIS — Z7902 Long term (current) use of antithrombotics/antiplatelets: Secondary | ICD-10-CM | POA: Diagnosis not present

## 2020-12-31 DIAGNOSIS — I10 Essential (primary) hypertension: Secondary | ICD-10-CM | POA: Diagnosis not present

## 2020-12-31 DIAGNOSIS — E038 Other specified hypothyroidism: Secondary | ICD-10-CM

## 2020-12-31 DIAGNOSIS — Z79890 Hormone replacement therapy (postmenopausal): Secondary | ICD-10-CM | POA: Diagnosis not present

## 2020-12-31 DIAGNOSIS — Z794 Long term (current) use of insulin: Secondary | ICD-10-CM | POA: Diagnosis not present

## 2020-12-31 DIAGNOSIS — I890 Lymphedema, not elsewhere classified: Secondary | ICD-10-CM | POA: Diagnosis not present

## 2020-12-31 DIAGNOSIS — I70212 Atherosclerosis of native arteries of extremities with intermittent claudication, left leg: Secondary | ICD-10-CM | POA: Diagnosis not present

## 2020-12-31 DIAGNOSIS — E1151 Type 2 diabetes mellitus with diabetic peripheral angiopathy without gangrene: Secondary | ICD-10-CM | POA: Diagnosis not present

## 2020-12-31 DIAGNOSIS — L97329 Non-pressure chronic ulcer of left ankle with unspecified severity: Secondary | ICD-10-CM | POA: Diagnosis not present

## 2020-12-31 DIAGNOSIS — Z79899 Other long term (current) drug therapy: Secondary | ICD-10-CM | POA: Diagnosis not present

## 2020-12-31 NOTE — Telephone Encounter (Signed)
Left message

## 2020-12-31 NOTE — Telephone Encounter (Signed)
Chase [name] from [facility] called, he said to disregard needing verbal orders.

## 2021-01-01 DIAGNOSIS — E1151 Type 2 diabetes mellitus with diabetic peripheral angiopathy without gangrene: Secondary | ICD-10-CM | POA: Diagnosis not present

## 2021-01-01 DIAGNOSIS — Z4800 Encounter for change or removal of nonsurgical wound dressing: Secondary | ICD-10-CM | POA: Diagnosis not present

## 2021-01-01 DIAGNOSIS — L97321 Non-pressure chronic ulcer of left ankle limited to breakdown of skin: Secondary | ICD-10-CM | POA: Diagnosis not present

## 2021-01-01 DIAGNOSIS — E039 Hypothyroidism, unspecified: Secondary | ICD-10-CM | POA: Diagnosis not present

## 2021-01-01 DIAGNOSIS — I83023 Varicose veins of left lower extremity with ulcer of ankle: Secondary | ICD-10-CM | POA: Diagnosis not present

## 2021-01-01 DIAGNOSIS — R531 Weakness: Secondary | ICD-10-CM | POA: Diagnosis not present

## 2021-01-06 DIAGNOSIS — E1151 Type 2 diabetes mellitus with diabetic peripheral angiopathy without gangrene: Secondary | ICD-10-CM | POA: Diagnosis not present

## 2021-01-06 DIAGNOSIS — I83023 Varicose veins of left lower extremity with ulcer of ankle: Secondary | ICD-10-CM | POA: Diagnosis not present

## 2021-01-06 DIAGNOSIS — L97321 Non-pressure chronic ulcer of left ankle limited to breakdown of skin: Secondary | ICD-10-CM | POA: Diagnosis not present

## 2021-01-06 DIAGNOSIS — R531 Weakness: Secondary | ICD-10-CM | POA: Diagnosis not present

## 2021-01-06 DIAGNOSIS — Z4800 Encounter for change or removal of nonsurgical wound dressing: Secondary | ICD-10-CM | POA: Diagnosis not present

## 2021-01-06 DIAGNOSIS — E039 Hypothyroidism, unspecified: Secondary | ICD-10-CM | POA: Diagnosis not present

## 2021-01-07 DIAGNOSIS — Z9181 History of falling: Secondary | ICD-10-CM | POA: Diagnosis not present

## 2021-01-07 DIAGNOSIS — E785 Hyperlipidemia, unspecified: Secondary | ICD-10-CM | POA: Diagnosis not present

## 2021-01-07 DIAGNOSIS — L97321 Non-pressure chronic ulcer of left ankle limited to breakdown of skin: Secondary | ICD-10-CM | POA: Diagnosis not present

## 2021-01-07 DIAGNOSIS — R531 Weakness: Secondary | ICD-10-CM | POA: Diagnosis not present

## 2021-01-07 DIAGNOSIS — E1151 Type 2 diabetes mellitus with diabetic peripheral angiopathy without gangrene: Secondary | ICD-10-CM | POA: Diagnosis not present

## 2021-01-07 DIAGNOSIS — E039 Hypothyroidism, unspecified: Secondary | ICD-10-CM | POA: Diagnosis not present

## 2021-01-07 DIAGNOSIS — Z794 Long term (current) use of insulin: Secondary | ICD-10-CM | POA: Diagnosis not present

## 2021-01-07 DIAGNOSIS — I10 Essential (primary) hypertension: Secondary | ICD-10-CM | POA: Diagnosis not present

## 2021-01-07 DIAGNOSIS — Z4800 Encounter for change or removal of nonsurgical wound dressing: Secondary | ICD-10-CM | POA: Diagnosis not present

## 2021-01-07 DIAGNOSIS — I83023 Varicose veins of left lower extremity with ulcer of ankle: Secondary | ICD-10-CM | POA: Diagnosis not present

## 2021-01-07 DIAGNOSIS — Z7902 Long term (current) use of antithrombotics/antiplatelets: Secondary | ICD-10-CM | POA: Diagnosis not present

## 2021-01-08 DIAGNOSIS — Z4800 Encounter for change or removal of nonsurgical wound dressing: Secondary | ICD-10-CM | POA: Diagnosis not present

## 2021-01-08 DIAGNOSIS — E039 Hypothyroidism, unspecified: Secondary | ICD-10-CM | POA: Diagnosis not present

## 2021-01-08 DIAGNOSIS — L97321 Non-pressure chronic ulcer of left ankle limited to breakdown of skin: Secondary | ICD-10-CM | POA: Diagnosis not present

## 2021-01-08 DIAGNOSIS — E1151 Type 2 diabetes mellitus with diabetic peripheral angiopathy without gangrene: Secondary | ICD-10-CM | POA: Diagnosis not present

## 2021-01-08 DIAGNOSIS — I83023 Varicose veins of left lower extremity with ulcer of ankle: Secondary | ICD-10-CM | POA: Diagnosis not present

## 2021-01-08 DIAGNOSIS — R531 Weakness: Secondary | ICD-10-CM | POA: Diagnosis not present

## 2021-01-10 DIAGNOSIS — L97321 Non-pressure chronic ulcer of left ankle limited to breakdown of skin: Secondary | ICD-10-CM | POA: Diagnosis not present

## 2021-01-10 DIAGNOSIS — I83023 Varicose veins of left lower extremity with ulcer of ankle: Secondary | ICD-10-CM | POA: Diagnosis not present

## 2021-01-10 DIAGNOSIS — E039 Hypothyroidism, unspecified: Secondary | ICD-10-CM | POA: Diagnosis not present

## 2021-01-10 DIAGNOSIS — R531 Weakness: Secondary | ICD-10-CM | POA: Diagnosis not present

## 2021-01-10 DIAGNOSIS — Z4800 Encounter for change or removal of nonsurgical wound dressing: Secondary | ICD-10-CM | POA: Diagnosis not present

## 2021-01-10 DIAGNOSIS — E1151 Type 2 diabetes mellitus with diabetic peripheral angiopathy without gangrene: Secondary | ICD-10-CM | POA: Diagnosis not present

## 2021-01-13 DIAGNOSIS — Z4800 Encounter for change or removal of nonsurgical wound dressing: Secondary | ICD-10-CM | POA: Diagnosis not present

## 2021-01-13 DIAGNOSIS — R531 Weakness: Secondary | ICD-10-CM | POA: Diagnosis not present

## 2021-01-13 DIAGNOSIS — I83023 Varicose veins of left lower extremity with ulcer of ankle: Secondary | ICD-10-CM | POA: Diagnosis not present

## 2021-01-13 DIAGNOSIS — L97321 Non-pressure chronic ulcer of left ankle limited to breakdown of skin: Secondary | ICD-10-CM | POA: Diagnosis not present

## 2021-01-13 DIAGNOSIS — E1151 Type 2 diabetes mellitus with diabetic peripheral angiopathy without gangrene: Secondary | ICD-10-CM | POA: Diagnosis not present

## 2021-01-13 DIAGNOSIS — E039 Hypothyroidism, unspecified: Secondary | ICD-10-CM | POA: Diagnosis not present

## 2021-01-14 DIAGNOSIS — I70212 Atherosclerosis of native arteries of extremities with intermittent claudication, left leg: Secondary | ICD-10-CM | POA: Diagnosis not present

## 2021-01-14 DIAGNOSIS — E1151 Type 2 diabetes mellitus with diabetic peripheral angiopathy without gangrene: Secondary | ICD-10-CM | POA: Diagnosis not present

## 2021-01-14 DIAGNOSIS — I70243 Atherosclerosis of native arteries of left leg with ulceration of ankle: Secondary | ICD-10-CM | POA: Diagnosis not present

## 2021-01-14 DIAGNOSIS — I890 Lymphedema, not elsewhere classified: Secondary | ICD-10-CM | POA: Diagnosis not present

## 2021-01-14 DIAGNOSIS — E11622 Type 2 diabetes mellitus with other skin ulcer: Secondary | ICD-10-CM | POA: Diagnosis not present

## 2021-01-14 DIAGNOSIS — I10 Essential (primary) hypertension: Secondary | ICD-10-CM | POA: Diagnosis not present

## 2021-01-14 DIAGNOSIS — L97329 Non-pressure chronic ulcer of left ankle with unspecified severity: Secondary | ICD-10-CM | POA: Diagnosis not present

## 2021-01-17 DIAGNOSIS — Z4800 Encounter for change or removal of nonsurgical wound dressing: Secondary | ICD-10-CM | POA: Diagnosis not present

## 2021-01-17 DIAGNOSIS — L97321 Non-pressure chronic ulcer of left ankle limited to breakdown of skin: Secondary | ICD-10-CM | POA: Diagnosis not present

## 2021-01-17 DIAGNOSIS — I83023 Varicose veins of left lower extremity with ulcer of ankle: Secondary | ICD-10-CM | POA: Diagnosis not present

## 2021-01-17 DIAGNOSIS — E1151 Type 2 diabetes mellitus with diabetic peripheral angiopathy without gangrene: Secondary | ICD-10-CM | POA: Diagnosis not present

## 2021-01-17 DIAGNOSIS — R531 Weakness: Secondary | ICD-10-CM | POA: Diagnosis not present

## 2021-01-17 DIAGNOSIS — E039 Hypothyroidism, unspecified: Secondary | ICD-10-CM | POA: Diagnosis not present

## 2021-01-20 DIAGNOSIS — I83023 Varicose veins of left lower extremity with ulcer of ankle: Secondary | ICD-10-CM | POA: Diagnosis not present

## 2021-01-20 DIAGNOSIS — E039 Hypothyroidism, unspecified: Secondary | ICD-10-CM | POA: Diagnosis not present

## 2021-01-20 DIAGNOSIS — R531 Weakness: Secondary | ICD-10-CM | POA: Diagnosis not present

## 2021-01-20 DIAGNOSIS — E1151 Type 2 diabetes mellitus with diabetic peripheral angiopathy without gangrene: Secondary | ICD-10-CM | POA: Diagnosis not present

## 2021-01-20 DIAGNOSIS — Z4800 Encounter for change or removal of nonsurgical wound dressing: Secondary | ICD-10-CM | POA: Diagnosis not present

## 2021-01-20 DIAGNOSIS — L97321 Non-pressure chronic ulcer of left ankle limited to breakdown of skin: Secondary | ICD-10-CM | POA: Diagnosis not present

## 2021-01-23 DIAGNOSIS — E039 Hypothyroidism, unspecified: Secondary | ICD-10-CM | POA: Diagnosis not present

## 2021-01-23 DIAGNOSIS — I83023 Varicose veins of left lower extremity with ulcer of ankle: Secondary | ICD-10-CM | POA: Diagnosis not present

## 2021-01-23 DIAGNOSIS — R531 Weakness: Secondary | ICD-10-CM | POA: Diagnosis not present

## 2021-01-23 DIAGNOSIS — Z4800 Encounter for change or removal of nonsurgical wound dressing: Secondary | ICD-10-CM | POA: Diagnosis not present

## 2021-01-23 DIAGNOSIS — E1151 Type 2 diabetes mellitus with diabetic peripheral angiopathy without gangrene: Secondary | ICD-10-CM | POA: Diagnosis not present

## 2021-01-23 DIAGNOSIS — L97321 Non-pressure chronic ulcer of left ankle limited to breakdown of skin: Secondary | ICD-10-CM | POA: Diagnosis not present

## 2021-01-24 ENCOUNTER — Telehealth: Payer: Self-pay

## 2021-01-24 ENCOUNTER — Other Ambulatory Visit: Payer: Self-pay

## 2021-01-24 MED ORDER — DIPHENOXYLATE-ATROPINE 2.5-0.025 MG PO TABS: ORAL_TABLET | ORAL | 1 refills | Status: DC

## 2021-01-24 NOTE — Telephone Encounter (Signed)
Rx sent 

## 2021-01-24 NOTE — Telephone Encounter (Signed)
Please call in Lomotil to the pharmacy

## 2021-01-27 ENCOUNTER — Other Ambulatory Visit: Payer: Self-pay | Admitting: Family Medicine

## 2021-01-27 ENCOUNTER — Telehealth: Payer: Self-pay

## 2021-01-27 DIAGNOSIS — E039 Hypothyroidism, unspecified: Secondary | ICD-10-CM | POA: Diagnosis not present

## 2021-01-27 DIAGNOSIS — L97321 Non-pressure chronic ulcer of left ankle limited to breakdown of skin: Secondary | ICD-10-CM | POA: Diagnosis not present

## 2021-01-27 DIAGNOSIS — I83023 Varicose veins of left lower extremity with ulcer of ankle: Secondary | ICD-10-CM | POA: Diagnosis not present

## 2021-01-27 DIAGNOSIS — R531 Weakness: Secondary | ICD-10-CM | POA: Diagnosis not present

## 2021-01-27 DIAGNOSIS — Z4800 Encounter for change or removal of nonsurgical wound dressing: Secondary | ICD-10-CM | POA: Diagnosis not present

## 2021-01-27 DIAGNOSIS — E1151 Type 2 diabetes mellitus with diabetic peripheral angiopathy without gangrene: Secondary | ICD-10-CM | POA: Diagnosis not present

## 2021-01-27 MED ORDER — DIPHENOXYLATE-ATROPINE 2.5-0.025 MG PO TABS: ORAL_TABLET | ORAL | 2 refills | Status: DC

## 2021-01-27 NOTE — Telephone Encounter (Signed)
Needs telephone visit with Dr Chauncey Cruel in the next 2 weeks please

## 2021-01-27 NOTE — Telephone Encounter (Signed)
I [facility] to [facility] [place], please let themm know,also needs a visit scheduled in next 2 weeks, telephone with me is fine

## 2021-01-27 NOTE — Telephone Encounter (Signed)
Requests refill on lomotil. Is this med ok to be refilled?

## 2021-01-28 DIAGNOSIS — I878 Other specified disorders of veins: Secondary | ICD-10-CM | POA: Diagnosis not present

## 2021-01-28 DIAGNOSIS — E785 Hyperlipidemia, unspecified: Secondary | ICD-10-CM | POA: Diagnosis not present

## 2021-01-28 DIAGNOSIS — I70202 Unspecified atherosclerosis of native arteries of extremities, left leg: Secondary | ICD-10-CM | POA: Diagnosis not present

## 2021-01-28 DIAGNOSIS — Z79899 Other long term (current) drug therapy: Secondary | ICD-10-CM | POA: Diagnosis not present

## 2021-01-28 DIAGNOSIS — E119 Type 2 diabetes mellitus without complications: Secondary | ICD-10-CM | POA: Diagnosis not present

## 2021-01-28 DIAGNOSIS — I10 Essential (primary) hypertension: Secondary | ICD-10-CM | POA: Diagnosis not present

## 2021-01-28 DIAGNOSIS — L97309 Non-pressure chronic ulcer of unspecified ankle with unspecified severity: Secondary | ICD-10-CM | POA: Diagnosis not present

## 2021-01-28 DIAGNOSIS — Z794 Long term (current) use of insulin: Secondary | ICD-10-CM | POA: Diagnosis not present

## 2021-01-28 DIAGNOSIS — I70243 Atherosclerosis of native arteries of left leg with ulceration of ankle: Secondary | ICD-10-CM | POA: Diagnosis not present

## 2021-01-28 DIAGNOSIS — Z880 Allergy status to penicillin: Secondary | ICD-10-CM | POA: Diagnosis not present

## 2021-01-28 DIAGNOSIS — Z7902 Long term (current) use of antithrombotics/antiplatelets: Secondary | ICD-10-CM | POA: Diagnosis not present

## 2021-01-28 NOTE — Telephone Encounter (Signed)
Patient scheduled for phone visit 01/30/21 @ 2:00 pm

## 2021-01-29 DIAGNOSIS — I890 Lymphedema, not elsewhere classified: Secondary | ICD-10-CM | POA: Diagnosis not present

## 2021-01-29 DIAGNOSIS — R2689 Other abnormalities of gait and mobility: Secondary | ICD-10-CM | POA: Diagnosis not present

## 2021-01-29 DIAGNOSIS — I70243 Atherosclerosis of native arteries of left leg with ulceration of ankle: Secondary | ICD-10-CM | POA: Diagnosis not present

## 2021-01-29 DIAGNOSIS — M79605 Pain in left leg: Secondary | ICD-10-CM | POA: Diagnosis not present

## 2021-01-30 ENCOUNTER — Other Ambulatory Visit: Payer: Self-pay

## 2021-01-30 ENCOUNTER — Other Ambulatory Visit: Payer: Self-pay | Admitting: Family Medicine

## 2021-01-30 ENCOUNTER — Telehealth: Payer: Medicare Other | Admitting: Family Medicine

## 2021-01-30 DIAGNOSIS — E1151 Type 2 diabetes mellitus with diabetic peripheral angiopathy without gangrene: Secondary | ICD-10-CM | POA: Diagnosis not present

## 2021-01-30 DIAGNOSIS — I83023 Varicose veins of left lower extremity with ulcer of ankle: Secondary | ICD-10-CM | POA: Diagnosis not present

## 2021-01-30 DIAGNOSIS — E039 Hypothyroidism, unspecified: Secondary | ICD-10-CM | POA: Diagnosis not present

## 2021-01-30 DIAGNOSIS — R531 Weakness: Secondary | ICD-10-CM | POA: Diagnosis not present

## 2021-01-30 DIAGNOSIS — L97321 Non-pressure chronic ulcer of left ankle limited to breakdown of skin: Secondary | ICD-10-CM | POA: Diagnosis not present

## 2021-01-30 DIAGNOSIS — Z4800 Encounter for change or removal of nonsurgical wound dressing: Secondary | ICD-10-CM | POA: Diagnosis not present

## 2021-02-03 DIAGNOSIS — Z4800 Encounter for change or removal of nonsurgical wound dressing: Secondary | ICD-10-CM | POA: Diagnosis not present

## 2021-02-03 DIAGNOSIS — L97321 Non-pressure chronic ulcer of left ankle limited to breakdown of skin: Secondary | ICD-10-CM | POA: Diagnosis not present

## 2021-02-03 DIAGNOSIS — E039 Hypothyroidism, unspecified: Secondary | ICD-10-CM | POA: Diagnosis not present

## 2021-02-03 DIAGNOSIS — I83023 Varicose veins of left lower extremity with ulcer of ankle: Secondary | ICD-10-CM | POA: Diagnosis not present

## 2021-02-03 DIAGNOSIS — E1151 Type 2 diabetes mellitus with diabetic peripheral angiopathy without gangrene: Secondary | ICD-10-CM | POA: Diagnosis not present

## 2021-02-03 DIAGNOSIS — R531 Weakness: Secondary | ICD-10-CM | POA: Diagnosis not present

## 2021-02-05 DIAGNOSIS — R2689 Other abnormalities of gait and mobility: Secondary | ICD-10-CM | POA: Diagnosis not present

## 2021-02-05 DIAGNOSIS — I890 Lymphedema, not elsewhere classified: Secondary | ICD-10-CM | POA: Diagnosis not present

## 2021-02-05 DIAGNOSIS — M79605 Pain in left leg: Secondary | ICD-10-CM | POA: Diagnosis not present

## 2021-02-05 DIAGNOSIS — I70243 Atherosclerosis of native arteries of left leg with ulceration of ankle: Secondary | ICD-10-CM | POA: Diagnosis not present

## 2021-02-06 DIAGNOSIS — Z9181 History of falling: Secondary | ICD-10-CM | POA: Diagnosis not present

## 2021-02-06 DIAGNOSIS — L97321 Non-pressure chronic ulcer of left ankle limited to breakdown of skin: Secondary | ICD-10-CM | POA: Diagnosis not present

## 2021-02-06 DIAGNOSIS — E785 Hyperlipidemia, unspecified: Secondary | ICD-10-CM | POA: Diagnosis not present

## 2021-02-06 DIAGNOSIS — I10 Essential (primary) hypertension: Secondary | ICD-10-CM | POA: Diagnosis not present

## 2021-02-06 DIAGNOSIS — E039 Hypothyroidism, unspecified: Secondary | ICD-10-CM | POA: Diagnosis not present

## 2021-02-06 DIAGNOSIS — Z7902 Long term (current) use of antithrombotics/antiplatelets: Secondary | ICD-10-CM | POA: Diagnosis not present

## 2021-02-06 DIAGNOSIS — I83023 Varicose veins of left lower extremity with ulcer of ankle: Secondary | ICD-10-CM | POA: Diagnosis not present

## 2021-02-06 DIAGNOSIS — E1151 Type 2 diabetes mellitus with diabetic peripheral angiopathy without gangrene: Secondary | ICD-10-CM | POA: Diagnosis not present

## 2021-02-06 DIAGNOSIS — Z4800 Encounter for change or removal of nonsurgical wound dressing: Secondary | ICD-10-CM | POA: Diagnosis not present

## 2021-02-06 DIAGNOSIS — R531 Weakness: Secondary | ICD-10-CM | POA: Diagnosis not present

## 2021-02-06 DIAGNOSIS — Z794 Long term (current) use of insulin: Secondary | ICD-10-CM | POA: Diagnosis not present

## 2021-02-10 ENCOUNTER — Telehealth: Payer: Self-pay

## 2021-02-10 ENCOUNTER — Other Ambulatory Visit: Payer: Self-pay | Admitting: Family Medicine

## 2021-02-10 MED ORDER — DIPHENOXYLATE-ATROPINE 2.5-0.025 MG PO TABS: ORAL_TABLET | ORAL | 2 refills | Status: DC

## 2021-02-10 NOTE — Telephone Encounter (Signed)
Can this be sent electronically?

## 2021-02-10 NOTE — Telephone Encounter (Signed)
Patient needing refills on [facility]  p# [phone]

## 2021-02-10 NOTE — Telephone Encounter (Signed)
Had recent appt but was a no show for Dr Moshe Cipro. Needs tele visit in next 3-6 weeks

## 2021-02-10 NOTE — Telephone Encounter (Signed)
Sent to [facility] [place], also he needs an appointment with me, in xt 3 to 6 weeks can be phone

## 2021-02-11 DIAGNOSIS — E039 Hypothyroidism, unspecified: Secondary | ICD-10-CM | POA: Diagnosis not present

## 2021-02-11 DIAGNOSIS — E1151 Type 2 diabetes mellitus with diabetic peripheral angiopathy without gangrene: Secondary | ICD-10-CM | POA: Diagnosis not present

## 2021-02-11 DIAGNOSIS — R531 Weakness: Secondary | ICD-10-CM | POA: Diagnosis not present

## 2021-02-11 DIAGNOSIS — Z4800 Encounter for change or removal of nonsurgical wound dressing: Secondary | ICD-10-CM | POA: Diagnosis not present

## 2021-02-11 DIAGNOSIS — L97321 Non-pressure chronic ulcer of left ankle limited to breakdown of skin: Secondary | ICD-10-CM | POA: Diagnosis not present

## 2021-02-11 DIAGNOSIS — I83023 Varicose veins of left lower extremity with ulcer of ankle: Secondary | ICD-10-CM | POA: Diagnosis not present

## 2021-02-12 DIAGNOSIS — I70243 Atherosclerosis of native arteries of left leg with ulceration of ankle: Secondary | ICD-10-CM | POA: Diagnosis not present

## 2021-02-12 DIAGNOSIS — M79605 Pain in left leg: Secondary | ICD-10-CM | POA: Diagnosis not present

## 2021-02-12 DIAGNOSIS — I890 Lymphedema, not elsewhere classified: Secondary | ICD-10-CM | POA: Diagnosis not present

## 2021-02-12 DIAGNOSIS — R2689 Other abnormalities of gait and mobility: Secondary | ICD-10-CM | POA: Diagnosis not present

## 2021-02-14 DIAGNOSIS — E039 Hypothyroidism, unspecified: Secondary | ICD-10-CM | POA: Diagnosis not present

## 2021-02-14 DIAGNOSIS — E1151 Type 2 diabetes mellitus with diabetic peripheral angiopathy without gangrene: Secondary | ICD-10-CM | POA: Diagnosis not present

## 2021-02-14 DIAGNOSIS — I83023 Varicose veins of left lower extremity with ulcer of ankle: Secondary | ICD-10-CM | POA: Diagnosis not present

## 2021-02-14 DIAGNOSIS — R531 Weakness: Secondary | ICD-10-CM | POA: Diagnosis not present

## 2021-02-14 DIAGNOSIS — Z4800 Encounter for change or removal of nonsurgical wound dressing: Secondary | ICD-10-CM | POA: Diagnosis not present

## 2021-02-14 DIAGNOSIS — L97321 Non-pressure chronic ulcer of left ankle limited to breakdown of skin: Secondary | ICD-10-CM | POA: Diagnosis not present

## 2021-02-18 DIAGNOSIS — E119 Type 2 diabetes mellitus without complications: Secondary | ICD-10-CM | POA: Diagnosis not present

## 2021-02-18 DIAGNOSIS — E785 Hyperlipidemia, unspecified: Secondary | ICD-10-CM | POA: Diagnosis not present

## 2021-02-18 DIAGNOSIS — L97309 Non-pressure chronic ulcer of unspecified ankle with unspecified severity: Secondary | ICD-10-CM | POA: Diagnosis not present

## 2021-02-18 DIAGNOSIS — I878 Other specified disorders of veins: Secondary | ICD-10-CM | POA: Diagnosis not present

## 2021-02-18 DIAGNOSIS — I70202 Unspecified atherosclerosis of native arteries of extremities, left leg: Secondary | ICD-10-CM | POA: Diagnosis not present

## 2021-02-18 DIAGNOSIS — I10 Essential (primary) hypertension: Secondary | ICD-10-CM | POA: Diagnosis not present

## 2021-02-18 DIAGNOSIS — I890 Lymphedema, not elsewhere classified: Secondary | ICD-10-CM | POA: Diagnosis not present

## 2021-02-19 DIAGNOSIS — M79605 Pain in left leg: Secondary | ICD-10-CM | POA: Diagnosis not present

## 2021-02-19 DIAGNOSIS — I890 Lymphedema, not elsewhere classified: Secondary | ICD-10-CM | POA: Diagnosis not present

## 2021-02-19 DIAGNOSIS — R2689 Other abnormalities of gait and mobility: Secondary | ICD-10-CM | POA: Diagnosis not present

## 2021-02-19 DIAGNOSIS — I70243 Atherosclerosis of native arteries of left leg with ulceration of ankle: Secondary | ICD-10-CM | POA: Diagnosis not present

## 2021-02-21 DIAGNOSIS — E039 Hypothyroidism, unspecified: Secondary | ICD-10-CM | POA: Diagnosis not present

## 2021-02-21 DIAGNOSIS — L97321 Non-pressure chronic ulcer of left ankle limited to breakdown of skin: Secondary | ICD-10-CM | POA: Diagnosis not present

## 2021-02-21 DIAGNOSIS — Z4800 Encounter for change or removal of nonsurgical wound dressing: Secondary | ICD-10-CM | POA: Diagnosis not present

## 2021-02-21 DIAGNOSIS — E1151 Type 2 diabetes mellitus with diabetic peripheral angiopathy without gangrene: Secondary | ICD-10-CM | POA: Diagnosis not present

## 2021-02-21 DIAGNOSIS — I83023 Varicose veins of left lower extremity with ulcer of ankle: Secondary | ICD-10-CM | POA: Diagnosis not present

## 2021-02-21 DIAGNOSIS — R531 Weakness: Secondary | ICD-10-CM | POA: Diagnosis not present

## 2021-02-25 DIAGNOSIS — I83023 Varicose veins of left lower extremity with ulcer of ankle: Secondary | ICD-10-CM | POA: Diagnosis not present

## 2021-02-25 DIAGNOSIS — E039 Hypothyroidism, unspecified: Secondary | ICD-10-CM | POA: Diagnosis not present

## 2021-02-25 DIAGNOSIS — Z4800 Encounter for change or removal of nonsurgical wound dressing: Secondary | ICD-10-CM | POA: Diagnosis not present

## 2021-02-25 DIAGNOSIS — R531 Weakness: Secondary | ICD-10-CM | POA: Diagnosis not present

## 2021-02-25 DIAGNOSIS — E1151 Type 2 diabetes mellitus with diabetic peripheral angiopathy without gangrene: Secondary | ICD-10-CM | POA: Diagnosis not present

## 2021-02-25 DIAGNOSIS — L97321 Non-pressure chronic ulcer of left ankle limited to breakdown of skin: Secondary | ICD-10-CM | POA: Diagnosis not present

## 2021-02-26 DIAGNOSIS — I70243 Atherosclerosis of native arteries of left leg with ulceration of ankle: Secondary | ICD-10-CM | POA: Diagnosis not present

## 2021-02-26 DIAGNOSIS — I890 Lymphedema, not elsewhere classified: Secondary | ICD-10-CM | POA: Diagnosis not present

## 2021-02-26 DIAGNOSIS — R2689 Other abnormalities of gait and mobility: Secondary | ICD-10-CM | POA: Diagnosis not present

## 2021-02-26 DIAGNOSIS — M79605 Pain in left leg: Secondary | ICD-10-CM | POA: Diagnosis not present

## 2021-02-28 DIAGNOSIS — Z4800 Encounter for change or removal of nonsurgical wound dressing: Secondary | ICD-10-CM | POA: Diagnosis not present

## 2021-02-28 DIAGNOSIS — E1151 Type 2 diabetes mellitus with diabetic peripheral angiopathy without gangrene: Secondary | ICD-10-CM | POA: Diagnosis not present

## 2021-02-28 DIAGNOSIS — I83023 Varicose veins of left lower extremity with ulcer of ankle: Secondary | ICD-10-CM | POA: Diagnosis not present

## 2021-02-28 DIAGNOSIS — E039 Hypothyroidism, unspecified: Secondary | ICD-10-CM | POA: Diagnosis not present

## 2021-02-28 DIAGNOSIS — R531 Weakness: Secondary | ICD-10-CM | POA: Diagnosis not present

## 2021-02-28 DIAGNOSIS — L97321 Non-pressure chronic ulcer of left ankle limited to breakdown of skin: Secondary | ICD-10-CM | POA: Diagnosis not present

## 2021-03-04 DIAGNOSIS — E1151 Type 2 diabetes mellitus with diabetic peripheral angiopathy without gangrene: Secondary | ICD-10-CM | POA: Diagnosis not present

## 2021-03-04 DIAGNOSIS — I83023 Varicose veins of left lower extremity with ulcer of ankle: Secondary | ICD-10-CM | POA: Diagnosis not present

## 2021-03-04 DIAGNOSIS — L97321 Non-pressure chronic ulcer of left ankle limited to breakdown of skin: Secondary | ICD-10-CM | POA: Diagnosis not present

## 2021-03-04 DIAGNOSIS — R531 Weakness: Secondary | ICD-10-CM | POA: Diagnosis not present

## 2021-03-04 DIAGNOSIS — Z4800 Encounter for change or removal of nonsurgical wound dressing: Secondary | ICD-10-CM | POA: Diagnosis not present

## 2021-03-04 DIAGNOSIS — E039 Hypothyroidism, unspecified: Secondary | ICD-10-CM | POA: Diagnosis not present

## 2021-03-08 DIAGNOSIS — Z7902 Long term (current) use of antithrombotics/antiplatelets: Secondary | ICD-10-CM | POA: Diagnosis not present

## 2021-03-08 DIAGNOSIS — E785 Hyperlipidemia, unspecified: Secondary | ICD-10-CM | POA: Diagnosis not present

## 2021-03-08 DIAGNOSIS — E1151 Type 2 diabetes mellitus with diabetic peripheral angiopathy without gangrene: Secondary | ICD-10-CM | POA: Diagnosis not present

## 2021-03-08 DIAGNOSIS — Z9181 History of falling: Secondary | ICD-10-CM | POA: Diagnosis not present

## 2021-03-08 DIAGNOSIS — Z794 Long term (current) use of insulin: Secondary | ICD-10-CM | POA: Diagnosis not present

## 2021-03-08 DIAGNOSIS — R600 Localized edema: Secondary | ICD-10-CM | POA: Diagnosis not present

## 2021-03-08 DIAGNOSIS — E039 Hypothyroidism, unspecified: Secondary | ICD-10-CM | POA: Diagnosis not present

## 2021-03-08 DIAGNOSIS — I872 Venous insufficiency (chronic) (peripheral): Secondary | ICD-10-CM | POA: Diagnosis not present

## 2021-03-08 DIAGNOSIS — I10 Essential (primary) hypertension: Secondary | ICD-10-CM | POA: Diagnosis not present

## 2021-03-11 DIAGNOSIS — I10 Essential (primary) hypertension: Secondary | ICD-10-CM | POA: Diagnosis not present

## 2021-03-11 DIAGNOSIS — B351 Tinea unguium: Secondary | ICD-10-CM | POA: Diagnosis not present

## 2021-03-11 DIAGNOSIS — R600 Localized edema: Secondary | ICD-10-CM | POA: Diagnosis not present

## 2021-03-11 DIAGNOSIS — E1151 Type 2 diabetes mellitus with diabetic peripheral angiopathy without gangrene: Secondary | ICD-10-CM | POA: Diagnosis not present

## 2021-03-11 DIAGNOSIS — I872 Venous insufficiency (chronic) (peripheral): Secondary | ICD-10-CM | POA: Diagnosis not present

## 2021-03-11 DIAGNOSIS — E785 Hyperlipidemia, unspecified: Secondary | ICD-10-CM | POA: Diagnosis not present

## 2021-03-11 DIAGNOSIS — E1142 Type 2 diabetes mellitus with diabetic polyneuropathy: Secondary | ICD-10-CM | POA: Diagnosis not present

## 2021-03-11 DIAGNOSIS — E039 Hypothyroidism, unspecified: Secondary | ICD-10-CM | POA: Diagnosis not present

## 2021-03-17 DIAGNOSIS — I872 Venous insufficiency (chronic) (peripheral): Secondary | ICD-10-CM | POA: Diagnosis not present

## 2021-03-17 DIAGNOSIS — E785 Hyperlipidemia, unspecified: Secondary | ICD-10-CM | POA: Diagnosis not present

## 2021-03-17 DIAGNOSIS — E1151 Type 2 diabetes mellitus with diabetic peripheral angiopathy without gangrene: Secondary | ICD-10-CM | POA: Diagnosis not present

## 2021-03-17 DIAGNOSIS — E039 Hypothyroidism, unspecified: Secondary | ICD-10-CM | POA: Diagnosis not present

## 2021-03-17 DIAGNOSIS — R600 Localized edema: Secondary | ICD-10-CM | POA: Diagnosis not present

## 2021-03-17 DIAGNOSIS — I10 Essential (primary) hypertension: Secondary | ICD-10-CM | POA: Diagnosis not present

## 2021-03-18 DIAGNOSIS — I10 Essential (primary) hypertension: Secondary | ICD-10-CM | POA: Diagnosis not present

## 2021-03-18 DIAGNOSIS — E11622 Type 2 diabetes mellitus with other skin ulcer: Secondary | ICD-10-CM | POA: Diagnosis not present

## 2021-03-18 DIAGNOSIS — Z794 Long term (current) use of insulin: Secondary | ICD-10-CM | POA: Diagnosis not present

## 2021-03-18 DIAGNOSIS — Z79890 Hormone replacement therapy (postmenopausal): Secondary | ICD-10-CM | POA: Diagnosis not present

## 2021-03-18 DIAGNOSIS — E1151 Type 2 diabetes mellitus with diabetic peripheral angiopathy without gangrene: Secondary | ICD-10-CM | POA: Diagnosis not present

## 2021-03-18 DIAGNOSIS — I70243 Atherosclerosis of native arteries of left leg with ulceration of ankle: Secondary | ICD-10-CM | POA: Diagnosis not present

## 2021-03-18 DIAGNOSIS — Z7902 Long term (current) use of antithrombotics/antiplatelets: Secondary | ICD-10-CM | POA: Diagnosis not present

## 2021-03-18 DIAGNOSIS — E785 Hyperlipidemia, unspecified: Secondary | ICD-10-CM | POA: Diagnosis not present

## 2021-03-18 DIAGNOSIS — Z79899 Other long term (current) drug therapy: Secondary | ICD-10-CM | POA: Diagnosis not present

## 2021-03-18 DIAGNOSIS — I878 Other specified disorders of veins: Secondary | ICD-10-CM | POA: Diagnosis not present

## 2021-03-18 DIAGNOSIS — I890 Lymphedema, not elsewhere classified: Secondary | ICD-10-CM | POA: Diagnosis not present

## 2021-03-18 DIAGNOSIS — Z880 Allergy status to penicillin: Secondary | ICD-10-CM | POA: Diagnosis not present

## 2021-03-20 ENCOUNTER — Encounter: Payer: Self-pay | Admitting: Family Medicine

## 2021-03-20 ENCOUNTER — Telehealth (INDEPENDENT_AMBULATORY_CARE_PROVIDER_SITE_OTHER): Payer: Medicare Other | Admitting: Family Medicine

## 2021-03-20 ENCOUNTER — Other Ambulatory Visit: Payer: Self-pay

## 2021-03-20 VITALS — BP 150/77 | Temp 97.0°F

## 2021-03-20 DIAGNOSIS — Z794 Long term (current) use of insulin: Secondary | ICD-10-CM

## 2021-03-20 DIAGNOSIS — E1021 Type 1 diabetes mellitus with diabetic nephropathy: Secondary | ICD-10-CM | POA: Diagnosis not present

## 2021-03-20 DIAGNOSIS — I10 Essential (primary) hypertension: Secondary | ICD-10-CM | POA: Diagnosis not present

## 2021-03-20 DIAGNOSIS — I119 Hypertensive heart disease without heart failure: Secondary | ICD-10-CM | POA: Diagnosis not present

## 2021-03-20 DIAGNOSIS — E038 Other specified hypothyroidism: Secondary | ICD-10-CM

## 2021-03-20 DIAGNOSIS — G40909 Epilepsy, unspecified, not intractable, without status epilepticus: Secondary | ICD-10-CM

## 2021-03-20 DIAGNOSIS — E782 Mixed hyperlipidemia: Secondary | ICD-10-CM | POA: Diagnosis not present

## 2021-03-20 DIAGNOSIS — R197 Diarrhea, unspecified: Secondary | ICD-10-CM

## 2021-03-20 DIAGNOSIS — IMO0002 Reserved for concepts with insufficient information to code with codable children: Secondary | ICD-10-CM

## 2021-03-20 DIAGNOSIS — E1122 Type 2 diabetes mellitus with diabetic chronic kidney disease: Secondary | ICD-10-CM | POA: Diagnosis not present

## 2021-03-20 DIAGNOSIS — E1165 Type 2 diabetes mellitus with hyperglycemia: Secondary | ICD-10-CM

## 2021-03-20 DIAGNOSIS — N184 Chronic kidney disease, stage 4 (severe): Secondary | ICD-10-CM | POA: Diagnosis not present

## 2021-03-20 DIAGNOSIS — Z9181 History of falling: Secondary | ICD-10-CM

## 2021-03-20 NOTE — Progress Notes (Signed)
Virtual Visit via Telephone Note  I connected with [name] on 03/20/21 at  3:00 PM EDT by telephone and verified that I am speaking with the correct person using two identifiers.  Location: Patient: home  Provider: office  I discussed the limitations, risks, security and privacy concerns of performing an evaluation and management service by telephone and the availability of in person appointments. I also discussed with the patient that there may be a patient responsible charge related to this service. The patient expressed understanding and agreed to proceed.   History of Present Illness: Ulcer on leg fully healed after approx 8  Months of wound care. Slipped to the floor 2 weeks ago, no injury  Appetite good, and BM regular   Observations/Objective: BP (!) 150/77   Temp (!) 97 F (36.1 C)  Good communication with no confusion and intact memory. Alert and oriented x 3 No signs of respiratory distress during speech    Assessment and Plan: Essential hypertension, benign Reports elevated blood pressure will eval in office generally well controlled DASH diet and commitment to daily physical activity for a minimum of 30 minutes discussed and encouraged, as a part of hypertension management. The importance of attaining a healthy weight is also discussed.  BP/Weight 03/20/2021 11/12/2020 10/03/2020 10/01/2020 08/09/2020 08/05/2020 12/31/5807  Systolic BP 983 382 505 397 673 419 379  Diastolic BP 77 64 77 82 82 74 72  Wt. (Lbs) - - 177 171 171 171 171.12  BMI - - 26.91 26 26 26  26.02       Hypothyroidism Managed by Endo, currently controlled  Mixed hyperlipidemia Hyperlipidemia:Low fat diet discussed and encouraged.   Lipid Panel  Lab Results  Component Value Date   CHOL 165 10/01/2020   HDL 78 10/01/2020   LDLCALC 75 10/01/2020   TRIG 42 10/01/2020   CHOLHDL 2.1 10/01/2020     Controlled, no change in medication Updated lab needed at/ before next  visit.   Uncontrolled type 2 diabetes mellitus with stage 4 chronic kidney disease, with long-term current use of insulin [facility]) Mr. Diltz is reminded of the importance of commitment to daily physical activity for 30 minutes or more, as able and the need to limit carbohydrate intake to 30 to 60 grams per meal to help with blood sugar control.   The need to take medication as prescribed, test blood sugar as directed, and to call between visits if there is a concern that blood sugar is uncontrolled is also discussed.   Mr. Hersch is reminded of the importance of daily foot exam, annual eye examination, and good blood sugar, blood pressure and cholesterol control.  Diabetic Labs Latest Ref Rng & Units 10/03/2020 10/01/2020 07/05/2020 06/28/2020 02/07/2020  HbA1c 4.0 - 5.6 % 6.3(A) - - 5.8(A) 5.9(H)  Microalbumin Not Estab. ug/mL - - - - -  Micro/Creat Ratio 0.0 - 30.0 mg/g creat - - - - -  Chol <200 mg/dL - 165 176 - -  HDL > OR = 40 mg/dL - 78 76 - -  Calc LDL mg/dL (calc) - 75 88 - -  Triglycerides <150 mg/dL - 42 42 - -  Creatinine 0.70 - 1.11 mg/dL - 3.15(H) 3.23(H) - 3.13(H)   BP/Weight 03/20/2021 11/12/2020 10/03/2020 10/01/2020 08/09/2020 08/05/2020 0/24/0973  Systolic BP 532 992 426 834 196 222 979  Diastolic BP 77 64 77 82 82 74 72  Wt. (Lbs) - - 177 171 171 171 171.12  BMI - - 26.91 26 26 26  26.02   Foot/eye exam completion dates Latest Ref Rng & Units 12/20/2018 02/25/2017  Eye Exam No Retinopathy - -  Foot exam Order - - -  Foot Form Completion - Done Done        Diarrhea continue imodium as before  Epilepsy ([place]) Controlled , denies seizure activity  Hx of falling Home safety reviewed    Follow Up Instructions:    I discussed the assessment and treatment plan with the patient. The patient was provided an opportunity to ask questions and all were answered. The patient agreed with the plan and demonstrated an understanding of the instructions.   The patient was advised  to call back or seek an in-person evaluation if the symptoms worsen or if the condition fails to improve as anticipated.  I provided 14 minutes of non-face-to-face time during this encounter.   Tula Nakayama, MD

## 2021-03-20 NOTE — Patient Instructions (Signed)
F/U in office with mD in 6 months, call if you need me sooner   Please get fasting lipid, cmp and EGr and TSH and cBC next week   Be careful not to fall.  Thankful that u;lcer has healed and that you feel well overall  Thanks for choosing [facility], we consider it a privelige to serve you.

## 2021-03-21 ENCOUNTER — Encounter: Payer: Self-pay | Admitting: Family Medicine

## 2021-03-21 NOTE — Assessment & Plan Note (Signed)
Reports elevated blood pressure will eval in office generally well controlled DASH diet and commitment to daily physical activity for a minimum of 30 minutes discussed and encouraged, as a part of hypertension management. The importance of attaining a healthy weight is also discussed.  BP/Weight 03/20/2021 11/12/2020 10/03/2020 10/01/2020 08/09/2020 08/05/2020 0/37/0964  Systolic BP 383 818 403 754 360 677 034  Diastolic BP 77 64 77 82 82 74 72  Wt. (Lbs) - - 177 171 171 171 171.12  BMI - - 26.91 26 26 26  26.02

## 2021-03-21 NOTE — Assessment & Plan Note (Signed)
Controlled , denies seizure activity

## 2021-03-21 NOTE — Assessment & Plan Note (Signed)
Hyperlipidemia:Low fat diet discussed and encouraged.   Lipid Panel  Lab Results  Component Value Date   CHOL 165 10/01/2020   HDL 78 10/01/2020   LDLCALC 75 10/01/2020   TRIG 42 10/01/2020   CHOLHDL 2.1 10/01/2020     Controlled, no change in medication Updated lab needed at/ before next visit.

## 2021-03-21 NOTE — Assessment & Plan Note (Signed)
continue imodium as before

## 2021-03-21 NOTE — Assessment & Plan Note (Signed)
Managed by Endo, currently controlled

## 2021-03-21 NOTE — Assessment & Plan Note (Signed)
Chase [name] is reminded of the importance of commitment to daily physical activity for 30 minutes or more, as able and the need to limit carbohydrate intake to 30 to 60 grams per meal to help with blood sugar control.   The need to take medication as prescribed, test blood sugar as directed, and to call between visits if there is a concern that blood sugar is uncontrolled is also discussed.   Chase [name] is reminded of the importance of daily foot exam, annual eye examination, and good blood sugar, blood pressure and cholesterol control.  Diabetic Labs Latest Ref Rng & Units 10/03/2020 10/01/2020 07/05/2020 06/28/2020 02/07/2020  HbA1c 4.0 - 5.6 % 6.3(A) - - 5.8(A) 5.9(H)  Microalbumin Not Estab. ug/mL - - - - -  Micro/Creat Ratio 0.0 - 30.0 mg/g creat - - - - -  Chol <200 mg/dL - 165 176 - -  HDL > OR = 40 mg/dL - 78 76 - -  Calc LDL mg/dL (calc) - 75 88 - -  Triglycerides <150 mg/dL - 42 42 - -  Creatinine 0.70 - 1.11 mg/dL - 3.15(H) 3.23(H) - 3.13(H)   BP/Weight 03/20/2021 11/12/2020 10/03/2020 10/01/2020 08/09/2020 08/05/2020 1/79/1505  Systolic BP 697 948 016 553 748 270 786  Diastolic BP 77 64 77 82 82 74 72  Wt. (Lbs) - - 177 171 171 171 171.12  BMI - - 26.91 26 26 26  26.02   Foot/eye exam completion dates Latest Ref Rng & Units 12/20/2018 02/25/2017  Eye Exam No Retinopathy - -  Foot exam Order - - -  Foot Form Completion - Done Done

## 2021-03-21 NOTE — Assessment & Plan Note (Signed)
Home safety reviewed 

## 2021-03-25 DIAGNOSIS — E1151 Type 2 diabetes mellitus with diabetic peripheral angiopathy without gangrene: Secondary | ICD-10-CM | POA: Diagnosis not present

## 2021-03-25 DIAGNOSIS — E039 Hypothyroidism, unspecified: Secondary | ICD-10-CM | POA: Diagnosis not present

## 2021-03-25 DIAGNOSIS — E785 Hyperlipidemia, unspecified: Secondary | ICD-10-CM | POA: Diagnosis not present

## 2021-03-25 DIAGNOSIS — R600 Localized edema: Secondary | ICD-10-CM | POA: Diagnosis not present

## 2021-03-25 DIAGNOSIS — I10 Essential (primary) hypertension: Secondary | ICD-10-CM | POA: Diagnosis not present

## 2021-03-25 DIAGNOSIS — I872 Venous insufficiency (chronic) (peripheral): Secondary | ICD-10-CM | POA: Diagnosis not present

## 2021-03-26 DIAGNOSIS — I890 Lymphedema, not elsewhere classified: Secondary | ICD-10-CM | POA: Diagnosis not present

## 2021-03-26 DIAGNOSIS — I70243 Atherosclerosis of native arteries of left leg with ulceration of ankle: Secondary | ICD-10-CM | POA: Diagnosis not present

## 2021-03-27 DIAGNOSIS — I119 Hypertensive heart disease without heart failure: Secondary | ICD-10-CM | POA: Diagnosis not present

## 2021-03-27 DIAGNOSIS — N184 Chronic kidney disease, stage 4 (severe): Secondary | ICD-10-CM | POA: Diagnosis not present

## 2021-03-27 DIAGNOSIS — E1021 Type 1 diabetes mellitus with diabetic nephropathy: Secondary | ICD-10-CM | POA: Diagnosis not present

## 2021-03-27 DIAGNOSIS — D539 Nutritional anemia, unspecified: Secondary | ICD-10-CM | POA: Diagnosis not present

## 2021-03-27 DIAGNOSIS — E782 Mixed hyperlipidemia: Secondary | ICD-10-CM | POA: Diagnosis not present

## 2021-03-27 DIAGNOSIS — E038 Other specified hypothyroidism: Secondary | ICD-10-CM | POA: Diagnosis not present

## 2021-03-27 DIAGNOSIS — I10 Essential (primary) hypertension: Secondary | ICD-10-CM | POA: Diagnosis not present

## 2021-03-28 LAB — CMP14+EGFR
ALT: 9 IU/L (ref 0–44)
AST: 16 IU/L (ref 0–40)
Albumin/Globulin Ratio: 1.4 (ref 1.2–2.2)
Albumin: 4 g/dL (ref 3.6–4.6)
Alkaline Phosphatase: 82 IU/L (ref 44–121)
BUN/Creatinine Ratio: 13 (ref 10–24)
BUN: 46 mg/dL — ABNORMAL HIGH (ref 8–27)
Bilirubin Total: 0.3 mg/dL (ref 0.0–1.2)
CO2: 19 mmol/L — ABNORMAL LOW (ref 20–29)
Calcium: 9.3 mg/dL (ref 8.6–10.2)
Chloride: 108 mmol/L — ABNORMAL HIGH (ref 96–106)
Creatinine, Ser: 3.67 mg/dL — ABNORMAL HIGH (ref 0.76–1.27)
Globulin, Total: 2.8 g/dL (ref 1.5–4.5)
Glucose: 81 mg/dL (ref 65–99)
Potassium: 4.6 mmol/L (ref 3.5–5.2)
Sodium: 143 mmol/L (ref 134–144)
Total Protein: 6.8 g/dL (ref 6.0–8.5)
eGFR: 15 mL/min/{1.73_m2} — ABNORMAL LOW (ref >59)

## 2021-03-28 LAB — CBC WITH DIFFERENTIAL
Basophils Absolute: 0 10*3/uL (ref 0.0–0.2)
Basos: 1 %
EOS (ABSOLUTE): 0.4 10*3/uL (ref 0.0–0.4)
Eos: 6 %
Hematocrit: 27.3 % — ABNORMAL LOW (ref 37.5–51.0)
Hemoglobin: 9.1 g/dL — ABNORMAL LOW (ref 13.0–17.7)
Immature Grans (Abs): 0 10*3/uL (ref 0.0–0.1)
Immature Granulocytes: 0 %
Lymphocytes Absolute: 1.5 10*3/uL (ref 0.7–3.1)
Lymphs: 25 %
MCH: 31.2 pg (ref 26.6–33.0)
MCHC: 33.3 g/dL (ref 31.5–35.7)
MCV: 94 fL (ref 79–97)
Monocytes Absolute: 0.7 10*3/uL (ref 0.1–0.9)
Monocytes: 12 %
Neutrophils Absolute: 3.4 10*3/uL (ref 1.4–7.0)
Neutrophils: 56 %
RBC: 2.92 x10E6/uL — ABNORMAL LOW (ref 4.14–5.80)
RDW: 14.4 % (ref 11.6–15.4)
WBC: 6 10*3/uL (ref 3.4–10.8)

## 2021-03-28 LAB — LIPID PANEL
Chol/HDL Ratio: 2.1 ratio (ref 0.0–5.0)
Cholesterol, Total: 169 mg/dL (ref 100–199)
HDL: 81 mg/dL (ref >39)
LDL Chol Calc (NIH): 80 mg/dL (ref 0–99)
Triglycerides: 32 mg/dL (ref 0–149)
VLDL Cholesterol Cal: 8 mg/dL (ref 5–40)

## 2021-03-28 LAB — TSH: TSH: 3.03 u[IU]/mL (ref 0.450–4.500)

## 2021-04-01 DIAGNOSIS — R600 Localized edema: Secondary | ICD-10-CM | POA: Diagnosis not present

## 2021-04-01 DIAGNOSIS — E039 Hypothyroidism, unspecified: Secondary | ICD-10-CM | POA: Diagnosis not present

## 2021-04-01 DIAGNOSIS — I872 Venous insufficiency (chronic) (peripheral): Secondary | ICD-10-CM | POA: Diagnosis not present

## 2021-04-01 DIAGNOSIS — I10 Essential (primary) hypertension: Secondary | ICD-10-CM | POA: Diagnosis not present

## 2021-04-01 DIAGNOSIS — E785 Hyperlipidemia, unspecified: Secondary | ICD-10-CM | POA: Diagnosis not present

## 2021-04-01 DIAGNOSIS — E1151 Type 2 diabetes mellitus with diabetic peripheral angiopathy without gangrene: Secondary | ICD-10-CM | POA: Diagnosis not present

## 2021-04-02 ENCOUNTER — Other Ambulatory Visit: Payer: Self-pay

## 2021-04-02 ENCOUNTER — Telehealth: Payer: Self-pay

## 2021-04-02 DIAGNOSIS — E038 Other specified hypothyroidism: Secondary | ICD-10-CM

## 2021-04-02 NOTE — Telephone Encounter (Signed)
done

## 2021-04-02 NOTE — Telephone Encounter (Signed)
Can you re do the lab order for this pt. He did the TSH but need the T4 only so they do not draw twice

## 2021-04-03 ENCOUNTER — Other Ambulatory Visit: Payer: Self-pay

## 2021-04-03 ENCOUNTER — Ambulatory Visit (INDEPENDENT_AMBULATORY_CARE_PROVIDER_SITE_OTHER): Payer: Medicare Other | Admitting: Nurse Practitioner

## 2021-04-03 ENCOUNTER — Encounter: Payer: Self-pay | Admitting: Nurse Practitioner

## 2021-04-03 VITALS — BP 149/75 | HR 60

## 2021-04-03 DIAGNOSIS — I10 Essential (primary) hypertension: Secondary | ICD-10-CM

## 2021-04-03 DIAGNOSIS — E1122 Type 2 diabetes mellitus with diabetic chronic kidney disease: Secondary | ICD-10-CM | POA: Diagnosis not present

## 2021-04-03 DIAGNOSIS — E782 Mixed hyperlipidemia: Secondary | ICD-10-CM | POA: Diagnosis not present

## 2021-04-03 DIAGNOSIS — N184 Chronic kidney disease, stage 4 (severe): Secondary | ICD-10-CM

## 2021-04-03 DIAGNOSIS — E038 Other specified hypothyroidism: Secondary | ICD-10-CM

## 2021-04-03 DIAGNOSIS — Z794 Long term (current) use of insulin: Secondary | ICD-10-CM

## 2021-04-03 DIAGNOSIS — E1165 Type 2 diabetes mellitus with hyperglycemia: Secondary | ICD-10-CM

## 2021-04-03 DIAGNOSIS — IMO0002 Reserved for concepts with insufficient information to code with codable children: Secondary | ICD-10-CM

## 2021-04-03 LAB — POCT GLYCOSYLATED HEMOGLOBIN (HGB A1C): HbA1c, POC (controlled diabetic range): 5.9 % (ref 0.0–7.0)

## 2021-04-03 LAB — T4, FREE: Free T4: 1.22 ng/dL (ref 0.82–1.77)

## 2021-04-03 NOTE — Progress Notes (Signed)
04/03/2021           Endocrinology follow-up note    Subjective:    Patient ID: Chase [name], male    DOB: 1931/10/20, PCP [name], MD   Past Medical History:  Diagnosis Date  . Bradycardia 03/15/2012  . Carotid artery occlusion   . Choledocholithiasis 02/25/2018  . CKD (chronic kidney disease) stage 4, GFR 15-29 ml/min (HCC)   . Complete lesion of cervical spinal cord ([place]) 03/14/3012   Stable since 2006  . CVA (cerebrovascular accident) ([place]) 09/07/12   right sided weakness  . Depressive disorder, not elsewhere classified   . Diabetes mellitus approx 1994  . Diabetic neuropathy ([place])   . History of kidney stones   . Hypertensive heart disease   . Hypothyroidism approx 2000  . Lacunar stroke, acute ([place]) 03/14/2012  . Obesity   . Osteoarthrosis, unspecified whether generalized or localized, lower leg   . Other and unspecified hyperlipidemia   . Peripheral vascular disease, unspecified ([place])   . Seizures ([place])    unknown etiology; on meds, last seizure was 2015  . Spinal stenosis, unspecified region other than cervical   . Spondylosis of unspecified site without mention of myelopathy    Past Surgical History:  Procedure Laterality Date  . BILIARY STENT PLACEMENT N/A 05/27/2018   Procedure: BILIARY STENT PLACEMENT;  Surgeon: Rogene Houston, MD;  [facility];  Service: Endoscopy;  Laterality: N/A;  . COLONOSCOPY N/A 08/10/2013   Procedure: COLONOSCOPY;  Surgeon: Rogene Houston, MD;  [facility];  Service: Endoscopy;  Laterality: N/A;  240  . ERCP N/A 03/02/2018   Procedure: ENDOSCOPIC RETROGRADE CHOLANGIOPANCREATOGRAPHY (ERCP) With sphincterotomy and stent placement;  Surgeon: Rogene Houston, MD;  [facility];  Service: Gastroenterology;  Laterality: N/A;  . ERCP N/A 05/27/2018   Procedure: ENDOSCOPIC RETROGRADE CHOLANGIOPANCREATOGRAPHY (ERCP);  Surgeon: Rogene Houston, MD;  [facility];  Service: Endoscopy;   Laterality: N/A;  . ERCP N/A 09/16/2018   Procedure: ENDOSCOPIC RETROGRADE CHOLANGIOPANCREATOGRAPHY (ERCP);  Surgeon: Rogene Houston, MD;  [facility];  Service: Endoscopy;  Laterality: N/A;  . GASTROINTESTINAL STENT REMOVAL N/A 05/27/2018   Procedure: Biliary STENT REMOVAL;  Surgeon: Rogene Houston, MD;  [facility];  Service: Endoscopy;  Laterality: N/A;  . GASTROINTESTINAL STENT REMOVAL N/A 09/16/2018   Procedure: GASTROINTESTINAL STENT REMOVAL;  Surgeon: Rogene Houston, MD;  [facility];  Service: Endoscopy;  Laterality: N/A;  . kidney stones left x2  1975  . KIDNEY SURGERY     Ruptured left kidney 30 yrs ago  from a kidney stone  . LITHOTRIPSY N/A 05/27/2018   Procedure: MECHANICAL LITHOTRIPSY;  Surgeon: Rogene Houston, MD;  [facility];  Service: Endoscopy;  Laterality: N/A;  . REMOVAL OF STONES N/A 09/16/2018   Procedure: REMOVAL OF MULTIPLE STONES WITH BASKET AND BALLOON;  Surgeon: Rogene Houston, MD;  [facility];  Service: Endoscopy;  Laterality: N/A;  . SPHINCTEROTOMY N/A 05/27/2018   Procedure: SPHINCTEROTOMY extended;  Surgeon: Rogene Houston, MD;  [facility];  Service: Endoscopy;  Laterality: N/A;  . SPYGLASS CHOLANGIOSCOPY N/A 05/27/2018   Procedure: YFVCBSWH CHOLANGIOSCOPY;  Surgeon: Rogene Houston, MD;  [facility];  Service: Endoscopy;  Laterality: N/A;   Social History   Socioeconomic History  . Marital status: Married    Spouse name: Not on file  . Number  of children: 5  . Years of education: 8  . Highest education level: 8th grade  Occupational History  . Occupation: retired     Fish farm manager: RETIRED  Tobacco Use  . Smoking status: Never Smoker  . Smokeless tobacco: Never Used  Vaping Use  . Vaping Use: Never used  Substance and Sexual Activity  . Alcohol use: No    Alcohol/week: 0.0 standard drinks  . Drug use: No  . Sexual activity: Not Currently  Other Topics  Concern  . Not on file  Social History Narrative  . Not on file   Social Determinants of Health   Financial Resource Strain: Low Risk   . Difficulty of Paying Living Expenses: Not very hard  Food Insecurity: No Food Insecurity  . Worried About Charity fundraiser in the Last Year: Never true  . Ran Out of Food in the Last Year: Never true  Transportation Needs: No Transportation Needs  . Lack of Transportation (Medical): No  . Lack of Transportation (Non-Medical): No  Physical Activity: Inactive  . Days of Exercise per Week: 0 days  . Minutes of Exercise per Session: 0 min  Stress: No Stress Concern Present  . Feeling of Stress : Not at all  Social Connections: Moderately Integrated  . Frequency of Communication with Friends and Family: Twice a week  . Frequency of Social Gatherings with Friends and Family: Three times a week  . Attends Religious Services: 1 to 4 times per year  . Active Member of Clubs or Organizations: No  . Attends Archivist Meetings: Never  . Marital Status: Married   Outpatient Encounter Medications as of 04/03/2021  Medication Sig  . amLODipine (NORVASC) 10 MG tablet TAKE 1 TABLET DAILY  . calcium carbonate (TUMS - DOSED IN MG ELEMENTAL CALCIUM) 500 MG chewable tablet Chew 1 tablet by mouth 2 (two) times daily. For Bone Health  . clopidogrel (PLAVIX) 75 MG tablet TAKE 1 TABLET DAILY  . diphenoxylate-atropine (LOMOTIL) 2.5-0.025 MG tablet Take one tablet by mouth once daily, as needed, for watery, loose stool  . diphenoxylate-atropine (LOMOTIL) 2.5-0.025 MG tablet Take one tablet by mouth every  day, as needed, for loose stool  . HUMULIN 70/30 KWIKPEN (70-30) 100 UNIT/ML KwikPen INJECT 10 UNITS UNDER THE SKIN TWICE A DAY  . Insulin Pen Needle (B-D ULTRAFINE III SHORT PEN) 31G X 8 MM MISC 1 each by Does not apply route as directed.  . levETIRAcetam (KEPPRA) 500 MG tablet Take 250 mg by mouth at bedtime.   Marland Kitchen levothyroxine (SYNTHROID) 112 MCG tablet  TAKE 1 TABLET DAILY BEFORE BREAKFAST  . Multiple Vitamins-Minerals (MULTIVITAMIN WITH MINERALS) tablet Take 1 tablet by mouth daily.  Marland Kitchen olopatadine (PATANOL) 0.1 % ophthalmic solution INSTILL 1 DROP INTO EACH EYE TWICE DAILY  . OneTouch Delica Lancets 86V MISC 1 each by Other route 2 (two) times daily.  Glory Rosebush ULTRA test strip USE 1 STRIP TWICE A DAY AS INSTRUCTED  . polyethylene glycol powder (GLYCOLAX/MIRALAX) 17 GM/SCOOP powder Take 17 g by mouth daily.  . pravastatin (PRAVACHOL) 20 MG tablet TAKE 1 TABLET DAILY  . tamsulosin (FLOMAX) 0.4 MG CAPS capsule Take 1 capsule (0.4 mg total) by mouth daily.  . Vitamin D, Ergocalciferol, (DRISDOL) 1.25 MG (50000 UNIT) CAPS capsule Take 50,000 Units by mouth once a week.  . zinc gluconate 50 MG tablet Take by mouth.   No facility-administered encounter medications on file as of 04/03/2021.   ALLERGIES: Allergies  Allergen Reactions  .  Bayer Advanced Aspirin [Aspirin] Nausea And Vomiting  . Penicillins Nausea And Vomiting    Has patient had a PCN reaction causing immediate rash, facial/tongue/throat swelling, SOB or lightheadedness with hypotension: unknown Has patient had a PCN reaction causing severe rash involving mucus membranes or skin necrosis: unknown Has patient had a PCN reaction that required hospitalization: unknown Has patient had a PCN reaction occurring within the last 10 years: no If all of the above answers are "NO", then may proceed with Cephalosporin use.   VACCINATION STATUS: Immunization History  Administered Date(s) Administered  . Fluad Quad(high Dose 65+) 08/07/2019, 10/07/2020  . H1N1 11/14/2008  . Influenza Split 10/07/2011, 09/08/2012  . Influenza Whole 08/22/2007, 08/27/2010  . Influenza,inj,Quad PF,6+ Mos 08/22/2013, 09/26/2014, 09/26/2015, 09/01/2016, 09/20/2017, 09/26/2018  . Moderna Sars-Covid-2 Vaccination 01/01/2020, 02/01/2020, 01/05/2021  . Pneumococcal Conjugate-13 06/12/2014  . Pneumococcal  Polysaccharide-23 05/21/2004  . Td 05/21/2004  . Tdap 10/07/2011    Diabetes He presents for his follow-up diabetic visit. He has type 2 diabetes mellitus. Onset time: He was diagnosed at approximate age of 20 years. His disease course has been stable. There are no hypoglycemic associated symptoms. Pertinent negatives for hypoglycemia include no confusion, headaches, nervousness/anxiousness, pallor, seizures or tremors. Pertinent negatives for diabetes include no chest pain, no fatigue, no polydipsia, no polyphagia, no polyuria, no weakness and no weight loss. There are no hypoglycemic complications. Symptoms are stable. Diabetic complications include a CVA, nephropathy, peripheral neuropathy and PVD. Risk factors for coronary artery disease include diabetes mellitus, dyslipidemia, male sex, obesity, sedentary lifestyle and hypertension. Current diabetic treatment includes insulin injections. He is compliant with treatment most of the time. His weight is stable. He is following a diabetic and generally healthy diet. When asked about meal planning, he reported none. He has not had a previous visit with a dietitian. He never participates in exercise. His home blood glucose trend is fluctuating minimally. His breakfast blood glucose range is generally 110-130 mg/dl. His bedtime blood glucose range is generally 180-200 mg/dl. (He presents today, accompanied by his wife, with his logs showing at goal target and above target postprandial glycemic profile.  His POCT A1c today is 5.9%, stable from last visit.  He has known discrepancy between his A1c and his glucose logs due to anemia.  There are no episodes of hypoglycemia noted or reported.) An ACE inhibitor/angiotensin II receptor blocker is not being taken. He does not see a podiatrist.Eye exam is current.  Hyperlipidemia This is a chronic problem. The current episode started more than 1 year ago. The problem is controlled. Recent lipid tests were reviewed and  are normal. Exacerbating diseases include chronic renal disease, diabetes and hypothyroidism. There are no known factors aggravating his hyperlipidemia. Pertinent negatives include no chest pain, myalgias or shortness of breath. Current antihyperlipidemic treatment includes statins. The current treatment provides moderate improvement of lipids. There are no compliance problems.  Risk factors for coronary artery disease include dyslipidemia, diabetes mellitus, hypertension, male sex and a sedentary lifestyle.  Hypertension This is a chronic problem. The current episode started more than 1 year ago. The problem has been gradually improving since onset. The problem is controlled. Pertinent negatives include no chest pain, headaches, neck pain, palpitations or shortness of breath. Agents associated with hypertension include thyroid hormones. Risk factors for coronary artery disease include diabetes mellitus, dyslipidemia, sedentary lifestyle and male gender. Past treatments include calcium channel blockers. The current treatment provides mild improvement. There are no compliance problems.  Hypertensive end-organ damage includes kidney  disease, CAD/MI, CVA and PVD. Identifiable causes of hypertension include chronic renal disease and a thyroid problem.  Thyroid Problem Presents for follow-up visit. Symptoms include cold intolerance. Patient reports no anxiety, constipation, depressed mood, diarrhea, fatigue, heat intolerance, leg swelling, palpitations, tremors, weight gain or weight loss. The symptoms have been stable. Past treatments include levothyroxine. His past medical history is significant for diabetes and hyperlipidemia.    Review of systems  Constitutional: + Minimally fluctuating body weight,  current There is no height or weight on file to calculate BMI., no fatigue, no subjective hyperthermia, + subjective hypothermia Eyes: no blurry vision, no xerophthalmia ENT: no sore throat, no nodules  palpated in throat, no dysphagia/odynophagia, no hoarseness Cardiovascular: no chest pain, no shortness of breath, no palpitations, no leg swelling Respiratory: no cough, no shortness of breath Gastrointestinal: no nausea/vomiting/diarrhea Musculoskeletal: no muscle/joint aches, essentially w/c bound due to deconditioning Skin: no rashes, no hyperemia Neurological: no tremors, no numbness, no tingling, no dizziness Psychiatric: no depression, no anxiety  Objective:    BP (!) 149/75 (BP Location: Left Arm, Patient Position: Sitting)   Pulse 60   Wt Readings from Last 3 Encounters:  10/03/20 177 lb (80.3 kg)  10/01/20 171 lb (77.6 kg)  08/09/20 171 lb (77.6 kg)    BP Readings from Last 3 Encounters:  04/03/21 (!) 149/75  03/20/21 (!) 150/77  11/12/20 133/64    Physical Exam- Limited  Constitutional:  There is no height or weight on file to calculate BMI. , not in acute distress, normal state of mind Eyes:  EOMI, no exophthalmos Neck: Supple Cardiovascular: RRR, no murmers, rubs, or gallops, no edema Respiratory: Adequate breathing efforts, no crackles, rales, rhonchi, or wheezing Musculoskeletal: no gross deformities, no gross restriction of joint movements, essentially w/c bound due to deconditioning Skin:  no rashes, no hyperemia Neurological: no tremor with outstretched hands     CMP     Component Value Date/Time   NA 143 03/27/2021 0958   K 4.6 03/27/2021 0958   CL 108 (H) 03/27/2021 0958   CO2 19 (L) 03/27/2021 0958   GLUCOSE 81 03/27/2021 0958   GLUCOSE 183 (H) 10/01/2020 1136   BUN 46 (H) 03/27/2021 0958   CREATININE 3.67 (H) 03/27/2021 0958   CREATININE 3.15 (H) 10/01/2020 1136   CALCIUM 9.3 03/27/2021 0958   PROT 6.8 03/27/2021 0958   ALBUMIN 4.0 03/27/2021 0958   AST 16 03/27/2021 0958   ALT 9 03/27/2021 0958   ALKPHOS 82 03/27/2021 0958   BILITOT 0.3 03/27/2021 0958   GFRNONAA 16 (L) 07/05/2020 0950   GFRAA 19 (L) 07/05/2020 0950   Diabetic Labs  (most recent): Lab Results  Component Value Date   HGBA1C 5.9 04/03/2021   HGBA1C 6.3 (A) 10/03/2020   HGBA1C 5.8 (A) 06/28/2020    Lipid Panel     Component Value Date/Time   CHOL 169 03/27/2021 0958   TRIG 32 03/27/2021 0958   HDL 81 03/27/2021 0958   CHOLHDL 2.1 03/27/2021 0958   CHOLHDL 2.1 10/01/2020 1136   VLDL 8 03/12/2016 1139   LDLCALC 80 03/27/2021 0958   LDLCALC 75 10/01/2020 1136     Assessment & Plan:   1)  Controlled Type 2 diabetes mellitus with stage 4 chronic kidney disease, with long-term current use of insulin (HCC)  -his diabetes is  complicated by chronic kidney disease stage IV and patient remains at a high risk for more acute and chronic complications of diabetes which include CAD, CVA, CKD,  retinopathy, and neuropathy. These are all discussed in detail with the patient.  -His wife is assisting on 100% of his care.  He presents today, accompanied by his wife, with his logs showing at goal target and above target postprandial glycemic profile.  His POCT A1c today is 5.9%, stable from last visit.  He has known discrepancy between his A1c and his glucose logs due to anemia.  There are no episodes of hypoglycemia noted or reported.  - Nutritional counseling repeated at each appointment due to patients tendency to fall back in to old habits.  - The patient admits there is a room for improvement in their diet and drink choices. -  Suggestion is made for the patient to avoid simple carbohydrates from their diet including Cakes, Sweet Desserts / Pastries, Ice Cream, Soda (diet and regular), Sweet Tea, Candies, Chips, Cookies, Sweet Pastries,  Store Bought Juices, Alcohol in Excess of  1-2 drinks a day, Artificial Sweeteners, Coffee Creamer, and "Sugar-free" Products. This will help patient to have stable blood glucose profile and potentially avoid unintended weight gain.   - I encouraged the patient to switch to  unprocessed or minimally processed complex starch and  increased protein intake (animal or plant source), fruits, and vegetables.   - Patient is advised to stick to a routine mealtimes to eat 3 meals  a day and avoid unnecessary snacks ( to snack only to correct hypoglycemia).  - I have approached patient with the following individualized plan to manage diabetes and patient agrees.  -Advised patient and wife to continue on current regimen of Novolin 70/30 10 units twice a day with breakfast and supper when premeal blood glucose is greater than 150 and he is eating.    -He is encouraged to continue monitoring blood glucose 3 times daily, before injecting insulin at breakfast and supper, and before bed.  She is advised to call the clinic if she has readings less than 80 or greater than 300 for 3 tests in a row.   - Patient is warned not to take insulin without proper monitoring per orders.  -Patient is not a candidate for metformin,SGLT2 inhibitors due to CKD.  2) BP/HTN:  His blood pressure is controlled to target for his age.  He is advised to continue Amlodipine 10 mg po daily.  3) Lipids/HPL:  His recent lipid panel from 03/27/21 shows controlled LDL of 80  He is advised to continue Pravastatin 20 mg po daily before bed.  Side effects and precautions discussed with him.    4) Hypothyroidism:  -His recent thyroid function tests are consistent with appropriate hormone replacement.  He is advised to continue Levothyroxine 112 mcg po daily before breakfast.     - We discussed about the correct intake of his thyroid hormone, on empty stomach at fasting, with water, separated by at least 30 minutes from breakfast and other medications,  and separated by more than 4 hours from calcium, iron, multivitamins, acid reflux medications (PPIs). -Patient is made aware of the fact that thyroid hormone replacement is needed for life, dose to be adjusted by periodic monitoring of thyroid function tests.   - I advised patient to maintain close follow up with  [name], MD for primary care needs.    I spent 30 minutes in the care of the patient today including review of labs from [place], Lipids, Thyroid Function, Hematology (current and previous including abstractions from other facilities); face-to-face time discussing  his blood glucose readings/logs, discussing hypoglycemia  and hyperglycemia episodes and symptoms, medications doses, his options of short and long term treatment based on the latest standards of care / guidelines;  discussion about incorporating lifestyle medicine;  and documenting the encounter.    Please refer to Patient Instructions for Blood Glucose Monitoring and Insulin/Medications Dosing Guide"  in media tab for additional information. Please  also refer to " Patient Self Inventory" in the Media  tab for reviewed elements of pertinent patient history.  [name] participated in the discussions, expressed understanding, and voiced agreement with the above plans.  All questions were answered to his satisfaction. he is encouraged to contact clinic should he have any questions or concerns prior to his return visit.    Follow up plan: -Return in about 6 months (around 10/03/2021) for Diabetes F/U with A1c in office, Thyroid follow up, Bring meter and logs, Previsit labs.    Rayetta Pigg, [facility] [address] [place], [place] [address] Phone: [phone] Fax: [phone]  04/03/2021, 1:41 PM

## 2021-04-03 NOTE — Patient Instructions (Signed)

## 2021-04-04 LAB — FERRITIN: Ferritin: 165 ng/mL (ref 30–400)

## 2021-04-04 LAB — VITAMIN B12: Vitamin B-12: 1372 pg/mL — ABNORMAL HIGH (ref 232–1245)

## 2021-04-04 LAB — FOLATE: Folate: >20 ng/mL (ref >3.0)

## 2021-04-04 LAB — SPECIMEN STATUS REPORT

## 2021-04-04 LAB — IRON: Iron: 48 ug/dL (ref 38–169)

## 2021-04-04 NOTE — Addendum Note (Signed)
Addended by: Eual Fines on: 04/04/2021 10:07 AM   Modules accepted: Orders

## 2021-04-07 DIAGNOSIS — R600 Localized edema: Secondary | ICD-10-CM | POA: Diagnosis not present

## 2021-04-07 DIAGNOSIS — E785 Hyperlipidemia, unspecified: Secondary | ICD-10-CM | POA: Diagnosis not present

## 2021-04-07 DIAGNOSIS — E039 Hypothyroidism, unspecified: Secondary | ICD-10-CM | POA: Diagnosis not present

## 2021-04-07 DIAGNOSIS — I10 Essential (primary) hypertension: Secondary | ICD-10-CM | POA: Diagnosis not present

## 2021-04-07 DIAGNOSIS — Z9181 History of falling: Secondary | ICD-10-CM | POA: Diagnosis not present

## 2021-04-07 DIAGNOSIS — Z7902 Long term (current) use of antithrombotics/antiplatelets: Secondary | ICD-10-CM | POA: Diagnosis not present

## 2021-04-07 DIAGNOSIS — E1151 Type 2 diabetes mellitus with diabetic peripheral angiopathy without gangrene: Secondary | ICD-10-CM | POA: Diagnosis not present

## 2021-04-07 DIAGNOSIS — Z794 Long term (current) use of insulin: Secondary | ICD-10-CM | POA: Diagnosis not present

## 2021-04-07 DIAGNOSIS — I872 Venous insufficiency (chronic) (peripheral): Secondary | ICD-10-CM | POA: Diagnosis not present

## 2021-04-23 DIAGNOSIS — D631 Anemia in chronic kidney disease: Secondary | ICD-10-CM | POA: Diagnosis not present

## 2021-04-23 DIAGNOSIS — E1122 Type 2 diabetes mellitus with diabetic chronic kidney disease: Secondary | ICD-10-CM | POA: Diagnosis not present

## 2021-04-23 DIAGNOSIS — I5032 Chronic diastolic (congestive) heart failure: Secondary | ICD-10-CM | POA: Diagnosis not present

## 2021-04-23 DIAGNOSIS — I129 Hypertensive chronic kidney disease with stage 1 through stage 4 chronic kidney disease, or unspecified chronic kidney disease: Secondary | ICD-10-CM | POA: Diagnosis not present

## 2021-04-23 DIAGNOSIS — R809 Proteinuria, unspecified: Secondary | ICD-10-CM | POA: Diagnosis not present

## 2021-04-23 DIAGNOSIS — E211 Secondary hyperparathyroidism, not elsewhere classified: Secondary | ICD-10-CM | POA: Diagnosis not present

## 2021-04-23 DIAGNOSIS — N189 Chronic kidney disease, unspecified: Secondary | ICD-10-CM | POA: Diagnosis not present

## 2021-04-23 DIAGNOSIS — E872 Acidosis: Secondary | ICD-10-CM | POA: Diagnosis not present

## 2021-04-23 DIAGNOSIS — E1129 Type 2 diabetes mellitus with other diabetic kidney complication: Secondary | ICD-10-CM | POA: Diagnosis not present

## 2021-05-01 ENCOUNTER — Other Ambulatory Visit: Payer: Self-pay | Admitting: Nephrology

## 2021-05-01 ENCOUNTER — Other Ambulatory Visit (HOSPITAL_COMMUNITY): Payer: Self-pay | Admitting: Nephrology

## 2021-05-01 DIAGNOSIS — D631 Anemia in chronic kidney disease: Secondary | ICD-10-CM

## 2021-05-01 DIAGNOSIS — E1122 Type 2 diabetes mellitus with diabetic chronic kidney disease: Secondary | ICD-10-CM

## 2021-05-01 DIAGNOSIS — E872 Acidosis, unspecified: Secondary | ICD-10-CM

## 2021-05-01 DIAGNOSIS — E1129 Type 2 diabetes mellitus with other diabetic kidney complication: Secondary | ICD-10-CM

## 2021-05-01 DIAGNOSIS — I5032 Chronic diastolic (congestive) heart failure: Secondary | ICD-10-CM

## 2021-05-01 DIAGNOSIS — N2581 Secondary hyperparathyroidism of renal origin: Secondary | ICD-10-CM

## 2021-05-01 DIAGNOSIS — I129 Hypertensive chronic kidney disease with stage 1 through stage 4 chronic kidney disease, or unspecified chronic kidney disease: Secondary | ICD-10-CM

## 2021-05-09 DIAGNOSIS — D631 Anemia in chronic kidney disease: Secondary | ICD-10-CM | POA: Diagnosis not present

## 2021-05-09 DIAGNOSIS — N189 Chronic kidney disease, unspecified: Secondary | ICD-10-CM | POA: Diagnosis not present

## 2021-05-09 DIAGNOSIS — N184 Chronic kidney disease, stage 4 (severe): Secondary | ICD-10-CM | POA: Diagnosis not present

## 2021-05-09 DIAGNOSIS — N281 Cyst of kidney, acquired: Secondary | ICD-10-CM | POA: Diagnosis not present

## 2021-05-09 DIAGNOSIS — N3289 Other specified disorders of bladder: Secondary | ICD-10-CM | POA: Diagnosis not present

## 2021-05-12 DIAGNOSIS — I440 Atrioventricular block, first degree: Secondary | ICD-10-CM | POA: Diagnosis not present

## 2021-05-12 DIAGNOSIS — R58 Hemorrhage, not elsewhere classified: Secondary | ICD-10-CM | POA: Diagnosis not present

## 2021-05-12 DIAGNOSIS — I452 Bifascicular block: Secondary | ICD-10-CM | POA: Diagnosis not present

## 2021-05-12 DIAGNOSIS — Z880 Allergy status to penicillin: Secondary | ICD-10-CM | POA: Diagnosis not present

## 2021-05-12 DIAGNOSIS — Z794 Long term (current) use of insulin: Secondary | ICD-10-CM | POA: Diagnosis not present

## 2021-05-12 DIAGNOSIS — K922 Gastrointestinal hemorrhage, unspecified: Secondary | ICD-10-CM | POA: Diagnosis not present

## 2021-05-12 DIAGNOSIS — Z886 Allergy status to analgesic agent status: Secondary | ICD-10-CM | POA: Diagnosis not present

## 2021-05-12 DIAGNOSIS — E119 Type 2 diabetes mellitus without complications: Secondary | ICD-10-CM | POA: Diagnosis not present

## 2021-05-12 DIAGNOSIS — Z5329 Procedure and treatment not carried out because of patient's decision for other reasons: Secondary | ICD-10-CM | POA: Diagnosis not present

## 2021-05-12 DIAGNOSIS — Z20822 Contact with and (suspected) exposure to covid-19: Secondary | ICD-10-CM | POA: Diagnosis not present

## 2021-05-12 DIAGNOSIS — I10 Essential (primary) hypertension: Secondary | ICD-10-CM | POA: Diagnosis not present

## 2021-05-13 ENCOUNTER — Other Ambulatory Visit: Payer: Self-pay

## 2021-05-13 ENCOUNTER — Emergency Department (HOSPITAL_COMMUNITY)
Admission: EM | Admit: 2021-05-13 | Discharge: 2021-05-14 | Disposition: A | Discharge status: Home / Self Care | Payer: Medicare Other | Attending: Emergency Medicine | Admitting: Emergency Medicine

## 2021-05-13 DIAGNOSIS — N184 Chronic kidney disease, stage 4 (severe): Secondary | ICD-10-CM | POA: Insufficient documentation

## 2021-05-13 DIAGNOSIS — Z794 Long term (current) use of insulin: Secondary | ICD-10-CM | POA: Insufficient documentation

## 2021-05-13 DIAGNOSIS — E1142 Type 2 diabetes mellitus with diabetic polyneuropathy: Secondary | ICD-10-CM | POA: Insufficient documentation

## 2021-05-13 DIAGNOSIS — E039 Hypothyroidism, unspecified: Secondary | ICD-10-CM | POA: Insufficient documentation

## 2021-05-13 DIAGNOSIS — I2510 Atherosclerotic heart disease of native coronary artery without angina pectoris: Secondary | ICD-10-CM | POA: Insufficient documentation

## 2021-05-13 DIAGNOSIS — E1121 Type 2 diabetes mellitus with diabetic nephropathy: Secondary | ICD-10-CM | POA: Insufficient documentation

## 2021-05-13 DIAGNOSIS — E1140 Type 2 diabetes mellitus with diabetic neuropathy, unspecified: Secondary | ICD-10-CM | POA: Insufficient documentation

## 2021-05-13 DIAGNOSIS — I129 Hypertensive chronic kidney disease with stage 1 through stage 4 chronic kidney disease, or unspecified chronic kidney disease: Secondary | ICD-10-CM | POA: Diagnosis not present

## 2021-05-13 DIAGNOSIS — E1143 Type 2 diabetes mellitus with diabetic autonomic (poly)neuropathy: Secondary | ICD-10-CM | POA: Diagnosis not present

## 2021-05-13 DIAGNOSIS — E1122 Type 2 diabetes mellitus with diabetic chronic kidney disease: Secondary | ICD-10-CM | POA: Insufficient documentation

## 2021-05-13 DIAGNOSIS — Z79899 Other long term (current) drug therapy: Secondary | ICD-10-CM | POA: Diagnosis not present

## 2021-05-13 DIAGNOSIS — K625 Hemorrhage of anus and rectum: Secondary | ICD-10-CM | POA: Diagnosis not present

## 2021-05-13 LAB — TYPE AND SCREEN
ABO/RH(D): O POS
Antibody Screen: NEGATIVE

## 2021-05-13 LAB — CBC
HCT: 30.1 % — ABNORMAL LOW (ref 39.0–52.0)
Hemoglobin: 9.3 g/dL — ABNORMAL LOW (ref 13.0–17.0)
MCH: 31.1 pg (ref 26.0–34.0)
MCHC: 30.9 g/dL (ref 30.0–36.0)
MCV: 100.7 fL — ABNORMAL HIGH (ref 80.0–100.0)
Platelets: 252 10*3/uL (ref 150–400)
RBC: 2.99 MIL/uL — ABNORMAL LOW (ref 4.22–5.81)
RDW: 14.9 % (ref 11.5–15.5)
WBC: 6.2 10*3/uL (ref 4.0–10.5)
nRBC: 0 % (ref 0.0–0.2)

## 2021-05-13 LAB — COMPREHENSIVE METABOLIC PANEL
ALT: 13 U/L (ref 0–44)
AST: 21 U/L (ref 15–41)
Albumin: 3.4 g/dL — ABNORMAL LOW (ref 3.5–5.0)
Alkaline Phosphatase: 64 U/L (ref 38–126)
Anion gap: 7 (ref 5–15)
BUN: 37 mg/dL — ABNORMAL HIGH (ref 8–23)
CO2: 24 mmol/L (ref 22–32)
Calcium: 9.3 mg/dL (ref 8.9–10.3)
Chloride: 108 mmol/L (ref 98–111)
Creatinine, Ser: 3.25 mg/dL — ABNORMAL HIGH (ref 0.61–1.24)
GFR, Estimated: 18 mL/min — ABNORMAL LOW (ref >60)
Glucose, Bld: 123 mg/dL — ABNORMAL HIGH (ref 70–99)
Potassium: 4.5 mmol/L (ref 3.5–5.1)
Sodium: 139 mmol/L (ref 135–145)
Total Bilirubin: 0.6 mg/dL (ref 0.3–1.2)
Total Protein: 7 g/dL (ref 6.5–8.1)

## 2021-05-13 NOTE — ED Notes (Signed)
Doctor of the pt is concern about the pt health sitting in the waiting room and his H&H LEVELS and would like to see if the patient could be moved back asap

## 2021-05-13 NOTE — ED Provider Notes (Signed)
Emergency Medicine Provider Triage Evaluation Note  Chase [name] , a 87 y.o. male  was evaluated in triage.  Pt complains of rectal bleeding.  Reportedly went to [facility], was told he had low hemoglobin and that "they couldn't do anything about it".  Plavix, no other blood thinners  Review of Systems  Positive: Rectal bleeding Negative: Abdominal pain  Physical Exam  BP (!) 167/69 (BP Location: Left Arm)   Pulse (!) 55   Temp 98.8 F (37.1 C) (Oral)   Resp 18   SpO2 100%  Gen:   Awake, no distress   Resp:  Normal effort  MSK:   Moves extremities without difficulty  Other:  Speech is not slurred  Medical Decision Making  Medically screening exam initiated at 1:20 PM.  Appropriate orders placed.  Chase [name] was informed that the remainder of the evaluation will be completed by another provider, this initial triage assessment does not replace that evaluation, and the importance of remaining in the ED until their evaluation is complete.     Lorin Glass, PA-C 05/13/21 1321    Charlesetta Shanks, MD 05/20/21 2135

## 2021-05-13 NOTE — ED Triage Notes (Signed)
Pt arrives with BRB per rectum since yesterday Copious amounts of blood so went to [facility] and was told his blood counts were low but they were unable to help him. Pt takes Plavix.

## 2021-05-14 DIAGNOSIS — K625 Hemorrhage of anus and rectum: Secondary | ICD-10-CM | POA: Diagnosis not present

## 2021-05-14 LAB — CBG MONITORING, ED: Glucose-Capillary: 116 mg/dL — ABNORMAL HIGH (ref 70–99)

## 2021-05-14 LAB — POC OCCULT BLOOD, ED: Fecal Occult Bld: POSITIVE — AB

## 2021-05-14 NOTE — ED Provider Notes (Signed)
[place] EMERGENCY DEPARTMENT Provider Note   CSN: [id number] Arrival date & time: 05/13/21  1156     History Chief Complaint  Patient presents with  . Rectal Bleeding    Chase [name] is a 87 y.o. male with a history of CVA, CAD on Plavix and ASA, diabetes mellitus type 2, PVD, peripheral neuropathy who presents to the emergency department with a chief complaint of rectal bleeding.  The patient had an episode of painless rectal bleeding that began while his aide was bathing him.  His wife states that blood was dripping down his bathing chair into the floor.  She states that there were large clots of blood.  Ultimately, the bleeding stopped spontaneously.  However, given her concern she called 911.  The patient went to [facility] ED where his hemoglobin was initially 9.2.  He was given 500 cc of IV fluids.  His repeat hemoglobin was checked 4 hours later and was 8.5.  The facility did not have GI available and was unsuccessful at getting the patient transferred.  He did not receive any blood products.  The patient left AMA.  His wife denies any other episodes of black or bloody stool.  The patient denies rectal pain, fever, chills, shortness of breath, dizziness, lightheadedness, abdominal pain, back pain, nausea, vomiting, hematemesis, hematuria, dysuria.  Per chart review, it appears that the patient's last colonoscopy was in 2014.  The history is provided by the patient and medical records. No language interpreter was used.       Past Medical History:  Diagnosis Date  . Bradycardia 03/15/2012  . Carotid artery occlusion   . Choledocholithiasis 02/25/2018  . CKD (chronic kidney disease) stage 4, GFR 15-29 ml/min (HCC)   . Complete lesion of cervical spinal cord ([place]) 03/14/3012   Stable since 2006  . CVA (cerebrovascular accident) ([place]) 09/07/12   right sided weakness  . Depressive disorder, not elsewhere classified   . Diabetes mellitus approx 1994  .  Diabetic neuropathy ([place])   . History of kidney stones   . Hypertensive heart disease   . Hypothyroidism approx 2000  . Lacunar stroke, acute ([place]) 03/14/2012  . Obesity   . Osteoarthrosis, unspecified whether generalized or localized, lower leg   . Other and unspecified hyperlipidemia   . Peripheral vascular disease, unspecified ([place])   . Seizures ([place])    unknown etiology; on meds, last seizure was 2015  . Spinal stenosis, unspecified region other than cervical   . Spondylosis of unspecified site without mention of myelopathy     Patient Active Problem List   Diagnosis Date Noted  . Epilepsy ([place]) 11/07/2020  . Polyneuropathy due to type 2 diabetes mellitus ([place]) 11/07/2020  . Atherosclerosis of native artery of left lower extremity with ulceration of ankle ([place]) 08/23/2020  . Loose stools 10/28/2019  . Incontinence 07/08/2019  . Osteoarthritis of left knee 04/15/2019  . Diarrhea 04/15/2019  . Common bile duct stone 04/18/2018  . Cholecystocutaneous fistula   . Physical deconditioning 02/28/2018  . Choledocholithiasis 02/25/2018  . Hyperkalemia 02/24/2018  . Metabolic bone disease 26/94/8546  . Fistula of bile duct 02/14/2018  . Dermatitis 02/25/2017  . Essential hypertension, benign 12/13/2015  . RBBB (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 fascicular block)   . Hypertensive heart disease 12/01/2014  . Chronic venous insufficiency 11/16/2014  . Carotid artery disease ([place]) 05/18/2014  . Poor balance 09/11/2013  . Difficulty walking 09/11/2013  . Hx of falling 09/11/2013  .  History of stroke 09/07/2012  . CKD (chronic kidney disease) stage 4, GFR 15-29 ml/min (HCC) 09/07/2012  . Anemia of chronic disease 09/07/2012  . Cholelithiasis 08/10/2012  . Peripheral autonomic neuropathy due to diabetes mellitus ([place]) 03/14/2012  . Seizure disorder, complex partial ([place]) 03/07/2012  . Uncontrolled type 2 diabetes mellitus with stage 4 chronic kidney disease, with long-term  current use of insulin ([place])   . Type 1 diabetes mellitus with diabetic nephropathy ([place]) 08/21/2008  . Hypothyroidism 03/09/2008  . Mixed hyperlipidemia 03/09/2008  . Renovascular hypertension 03/09/2008  . Peripheral vascular disease ([place]) 03/09/2008  . DEGENERATIVE JOINT DISEASE, KNEE 01/26/2008  . Osteoarthritis of spine 01/26/2008  . Spinal stenosis     Past Surgical History:  Procedure Laterality Date  . BILIARY STENT PLACEMENT N/A 05/27/2018   Procedure: BILIARY STENT PLACEMENT;  Surgeon: Rogene Houston, MD;  [facility];  Service: Endoscopy;  Laterality: N/A;  . COLONOSCOPY N/A 08/10/2013   Procedure: COLONOSCOPY;  Surgeon: Rogene Houston, MD;  [facility];  Service: Endoscopy;  Laterality: N/A;  240  . ERCP N/A 03/02/2018   Procedure: ENDOSCOPIC RETROGRADE CHOLANGIOPANCREATOGRAPHY (ERCP) With sphincterotomy and stent placement;  Surgeon: Rogene Houston, MD;  [facility];  Service: Gastroenterology;  Laterality: N/A;  . ERCP N/A 05/27/2018   Procedure: ENDOSCOPIC RETROGRADE CHOLANGIOPANCREATOGRAPHY (ERCP);  Surgeon: Rogene Houston, MD;  [facility];  Service: Endoscopy;  Laterality: N/A;  . ERCP N/A 09/16/2018   Procedure: ENDOSCOPIC RETROGRADE CHOLANGIOPANCREATOGRAPHY (ERCP);  Surgeon: Rogene Houston, MD;  [facility];  Service: Endoscopy;  Laterality: N/A;  . GASTROINTESTINAL STENT REMOVAL N/A 05/27/2018   Procedure: Biliary STENT REMOVAL;  Surgeon: Rogene Houston, MD;  [facility];  Service: Endoscopy;  Laterality: N/A;  . GASTROINTESTINAL STENT REMOVAL N/A 09/16/2018   Procedure: GASTROINTESTINAL STENT REMOVAL;  Surgeon: Rogene Houston, MD;  [facility];  Service: Endoscopy;  Laterality: N/A;  . kidney stones left x2  1975  . KIDNEY SURGERY     Ruptured left kidney 30 yrs ago  from a kidney stone  . LITHOTRIPSY N/A 05/27/2018   Procedure: MECHANICAL LITHOTRIPSY;  Surgeon: Rogene Houston, MD;  [facility];  Service: Endoscopy;  Laterality: N/A;  . REMOVAL OF STONES N/A 09/16/2018   Procedure: REMOVAL OF MULTIPLE STONES WITH BASKET AND BALLOON;  Surgeon: Rogene Houston, MD;  [facility];  Service: Endoscopy;  Laterality: N/A;  . SPHINCTEROTOMY N/A 05/27/2018   Procedure: SPHINCTEROTOMY extended;  Surgeon: Rogene Houston, MD;  [facility];  Service: Endoscopy;  Laterality: N/A;  . SPYGLASS CHOLANGIOSCOPY N/A 05/27/2018   Procedure: JOINOMVE CHOLANGIOSCOPY;  Surgeon: Rogene Houston, MD;  [facility];  Service: Endoscopy;  Laterality: N/A;       Family History  Problem Relation Age of Onset  . Diabetes Mother   . Prostate cancer Father   . Hypertension Brother   . Hypertension Brother   . Diabetes Brother   . Stroke Brother   . Diabetes Brother   . Diabetes Daughter   . Diabetes Daughter     Social History   Tobacco Use  . Smoking status: Never Smoker  . Smokeless tobacco: Never Used  Vaping Use  . Vaping Use: Never used  Substance Use Topics  . Alcohol use: No    Alcohol/week: 0.0 standard drinks  . Drug use: No    Home Medications  Prior to Admission medications   Medication Sig Start Date End Date Taking? Authorizing Provider  amLODipine (NORVASC) 10 MG tablet TAKE 1 TABLET DAILY 07/31/20   [name], MD  calcium carbonate (TUMS - DOSED IN MG ELEMENTAL CALCIUM) 500 MG chewable tablet Chew 1 tablet by mouth 2 (two) times daily. For Bone Health    [provider]  clopidogrel (PLAVIX) 75 MG tablet TAKE 1 TABLET DAILY 07/31/20   [name], MD  diphenoxylate-atropine (LOMOTIL) 2.5-0.025 MG tablet Take one tablet by mouth once daily, as needed, for watery, loose stool 01/27/21   [name], MD  diphenoxylate-atropine (LOMOTIL) 2.5-0.025 MG tablet Take one tablet by mouth every  day, as needed, for loose stool 02/10/21   [name], MD  HUMULIN 70/30 KWIKPEN  (70-30) 100 UNIT/ML KwikPen INJECT 10 UNITS UNDER THE SKIN TWICE A DAY 07/29/20   Cassandria Anger, MD  Insulin Pen Needle (B-D ULTRAFINE III SHORT PEN) 31G X 8 MM MISC 1 each by Does not apply route as directed. 09/20/19   Cassandria Anger, MD  levETIRAcetam (KEPPRA) 500 MG tablet Take 250 mg by mouth at bedtime.     [provider]  levothyroxine (SYNTHROID) 112 MCG tablet TAKE 1 TABLET DAILY BEFORE BREAKFAST 12/31/20   Brita Romp, NP  Multiple Vitamins-Minerals (MULTIVITAMIN WITH MINERALS) tablet Take 1 tablet by mouth daily.    [provider]  olopatadine (PATANOL) 0.1 % ophthalmic solution INSTILL 1 DROP INTO EACH EYE TWICE DAILY 03/24/19   [name], MD  OneTouch Delica Lancets 44I MISC 1 each by Other route 2 (two) times daily. 10/02/19   Cassandria Anger, MD  ONETOUCH ULTRA test strip USE 1 STRIP TWICE A DAY AS INSTRUCTED 07/02/20   Cassandria Anger, MD  polyethylene glycol powder (GLYCOLAX/MIRALAX) 17 GM/SCOOP powder Take 17 g by mouth daily. 07/03/20   [name], MD  pravastatin (PRAVACHOL) 20 MG tablet TAKE 1 TABLET DAILY 01/30/21   [name], MD  tamsulosin (FLOMAX) 0.4 MG CAPS capsule Take 1 capsule (0.4 mg total) by mouth daily. 09/04/20   Franchot Gallo, MD  Vitamin D, Ergocalciferol, (DRISDOL) 1.25 MG (50000 UNIT) CAPS capsule Take 50,000 Units by mouth once a week. 07/18/20   [provider]  zinc gluconate 50 MG tablet Take by mouth. 08/23/20   [provider]    Allergies    Bayer advanced aspirin [aspirin] and Penicillins  Review of Systems   Review of Systems  Constitutional: Negative for appetite change, chills, diaphoresis and fever.  HENT: Negative for congestion and sore throat.   Respiratory: Negative for shortness of breath.   Cardiovascular: Negative for chest pain.  Gastrointestinal: Positive for anal bleeding and hematochezia. Negative for abdominal pain, diarrhea, nausea,  rectal pain and vomiting.  Genitourinary: Negative for decreased urine volume, dysuria and hematuria.  Musculoskeletal: Negative for back pain, myalgias, neck pain and neck stiffness.  Skin: Negative for rash.  Allergic/Immunologic: Negative for immunocompromised state.  Neurological: Negative for syncope, weakness and headaches.  Psychiatric/Behavioral: Negative for confusion.    Physical Exam Updated Vital Signs BP (!) 166/72 (BP Location: Left Arm)   Pulse (!) 53   Temp 98 F (36.7 C) (Oral)   Resp 16   SpO2 100%   Physical Exam Vitals and nursing note reviewed.  Constitutional:      Appearance: He is well-developed.     Comments: Chronic appearing elderly male  HENT:  Head: Normocephalic.  Eyes:     Conjunctiva/sclera: Conjunctivae normal.  Cardiovascular:     Rate and Rhythm: Normal rate and regular rhythm.     Pulses: Normal pulses.     Heart sounds: No murmur heard.   Pulmonary:     Effort: Pulmonary effort is normal. No respiratory distress.     Breath sounds: No stridor. No wheezing, rhonchi or rales.  Chest:     Chest wall: No tenderness.  Abdominal:     General: There is no distension.     Palpations: Abdomen is soft. There is no mass.     Tenderness: There is no abdominal tenderness. There is no right CVA tenderness, left CVA tenderness, guarding or rebound.     Hernia: No hernia is present.     Comments: Abdomen is soft, nontender, nondistended.  Genitourinary:    Comments: Chaperoned exam with RN.  Normal rectal tone.  No external hemorrhoids noted.  Small amount of brown stool.  No frank blood. Musculoskeletal:     Cervical back: Neck supple.  Skin:    General: Skin is warm and dry.  Neurological:     Mental Status: He is alert.  Psychiatric:        Behavior: Behavior normal.     ED Results / Procedures / Treatments   Labs (all labs ordered are listed, but only abnormal results are displayed) Labs Reviewed  COMPREHENSIVE METABOLIC PANEL -  Abnormal; Notable for the following components:      Result Value   Glucose, Bld 123 (*)    BUN 37 (*)    Creatinine, Ser 3.25 (*)    Albumin 3.4 (*)    GFR, Estimated 18 (*)    All other components within normal limits  CBC - Abnormal; Notable for the following components:   RBC 2.99 (*)    Hemoglobin 9.3 (*)    HCT 30.1 (*)    MCV 100.7 (*)    All other components within normal limits  CBG MONITORING, ED - Abnormal; Notable for the following components:   Glucose-Capillary 116 (*)    All other components within normal limits  POC OCCULT BLOOD, ED  TYPE AND SCREEN    EKG None  Radiology No results found.  Procedures Procedures   Medications Ordered in ED Medications - No data to display  ED Course  I have reviewed the triage vital signs and the nursing notes.  Pertinent labs & imaging results that were available during my care of the patient were reviewed by me and considered in my medical decision making (see chart for details).    MDM Rules/Calculators/A&P                          87 year old male with a history of CVA, CAD on Plavix and ASA, diabetes mellitus type 2, PVD, peripheral neuropathy who presents to the emergency department with a chief complaint of painless rectal bleeding.  He had an isolated episode of rectal bleeding yesterday (6/6) where he was then evaluated in the ED at [facility] and admitted for observation after the patient had a decrease in his hemoglobin from arrival from 9.2 to 8.7.  The patient then left AMA and presented to [facility] for further evaluation.  He has bradycardia, but this appears stable from previous.  Vital signs are otherwise unremarkable.  He is on Plavix and ASA, but no other anticoagulants.  Labs and imaging have been reviewed and  independently interpreted by me.  Hemoglobin is 9.3/hematocrit 30.1.  In care everywhere, H/H was 8.7/27.5 at [facility] this morning prior to discharge.  Hemoccult was positive today,  but there was no gross blood on exam. Labs today otherwise appeared at his baseline.  I did see that the patient was administered a 500 cc fluid bolus upon arrival in the [facility] ED, which could account for dilutional anemia on his repeat CBCs.  I did previously review the patient's chart and saw that he had evidence of sigmoid diverticular disease from CT abdomen pelvis in 2019.  Could question internal hemorrhoids versus diverticular bleed.  Less suspicious for hemorrhagic colitis, variceal bleed, ruptured peptic ulcer. His last colonoscopy was in 2014, and unfortunately I was unable to view the results from his most recent colonoscopy.  The patient was seen and independently evaluated by Dr. Leonette Monarch, attending physician, who is in agreement with the work up and plan.  Given that last episode of rectal bleeding was more than 24 hours ago and patient has had stable and slightly improving labs.  All questions answered to both the patient and the family.  He is hemodynamically stable and appropriate for outpatient follow-up with gastroenterology.  ER return precautions given.  Safer discharge to home with outpatient follow-up as discussed.  Final Clinical Impression(s) / ED Diagnoses Final diagnoses:  None    Rx / DC Orders ED Discharge Orders    None       Joanne Gavel, PA-C 05/14/21 [id number]    Fatima Blank, MD 05/14/21 [phone]

## 2021-05-14 NOTE — Discharge Instructions (Signed)
Thank you for allowing me to care for you today in the Emergency Department.   You were seen today for an episode of rectal bleeding.  Your labs. Including your hemoglobin and hematocrit were stable and improved from yesterday. You symptoms might be due to a diverticular bleed. A previous CAT scan showed diverticulosis. Sometimes these little pouches can become very thing and bleed. Another consideration is internal hemorrhoids causing the bleeding.   Please call to schedule a follow-up appointment with Dr. Laural Golden, your gastroenterologist to discuss your symptoms and determine if you need a repeat colonoscopy.  Return to the emergency department if you develop sudden onset, severe cleaning with lots of black or bloody clots, if you have black or bloody vomiting, if you develop bleeding from your rectum with severe shortness of breath, if you pass out, or develop other new, concerning symptoms.

## 2021-05-15 ENCOUNTER — Telehealth: Payer: Self-pay

## 2021-05-15 NOTE — Telephone Encounter (Signed)
Chase [name] called from [facility] said the daughter Chase [name]) called cancelled this referral appointment and will be going to [place].  [facility] # [phone].

## 2021-05-16 DIAGNOSIS — K5909 Other constipation: Secondary | ICD-10-CM | POA: Diagnosis not present

## 2021-05-16 DIAGNOSIS — D631 Anemia in chronic kidney disease: Secondary | ICD-10-CM | POA: Diagnosis not present

## 2021-05-16 DIAGNOSIS — E1122 Type 2 diabetes mellitus with diabetic chronic kidney disease: Secondary | ICD-10-CM | POA: Diagnosis not present

## 2021-05-16 DIAGNOSIS — E872 Acidosis: Secondary | ICD-10-CM | POA: Diagnosis not present

## 2021-05-16 DIAGNOSIS — N281 Cyst of kidney, acquired: Secondary | ICD-10-CM | POA: Diagnosis not present

## 2021-05-16 DIAGNOSIS — E211 Secondary hyperparathyroidism, not elsewhere classified: Secondary | ICD-10-CM | POA: Diagnosis not present

## 2021-05-16 DIAGNOSIS — E1129 Type 2 diabetes mellitus with other diabetic kidney complication: Secondary | ICD-10-CM | POA: Diagnosis not present

## 2021-05-16 DIAGNOSIS — I129 Hypertensive chronic kidney disease with stage 1 through stage 4 chronic kidney disease, or unspecified chronic kidney disease: Secondary | ICD-10-CM | POA: Diagnosis not present

## 2021-05-16 DIAGNOSIS — N189 Chronic kidney disease, unspecified: Secondary | ICD-10-CM | POA: Diagnosis not present

## 2021-05-16 DIAGNOSIS — R195 Other fecal abnormalities: Secondary | ICD-10-CM | POA: Diagnosis not present

## 2021-05-16 DIAGNOSIS — R809 Proteinuria, unspecified: Secondary | ICD-10-CM | POA: Diagnosis not present

## 2021-05-16 DIAGNOSIS — I5032 Chronic diastolic (congestive) heart failure: Secondary | ICD-10-CM | POA: Diagnosis not present

## 2021-05-16 NOTE — Telephone Encounter (Signed)
Per Cecille Rubin close referral they do not need [place] to send referral

## 2021-05-20 DIAGNOSIS — L84 Corns and callosities: Secondary | ICD-10-CM | POA: Diagnosis not present

## 2021-05-20 DIAGNOSIS — B351 Tinea unguium: Secondary | ICD-10-CM | POA: Diagnosis not present

## 2021-05-20 DIAGNOSIS — E1142 Type 2 diabetes mellitus with diabetic polyneuropathy: Secondary | ICD-10-CM | POA: Diagnosis not present

## 2021-05-23 DIAGNOSIS — N184 Chronic kidney disease, stage 4 (severe): Secondary | ICD-10-CM | POA: Diagnosis not present

## 2021-06-02 DIAGNOSIS — Z23 Encounter for immunization: Secondary | ICD-10-CM | POA: Diagnosis not present

## 2021-06-04 ENCOUNTER — Telehealth: Payer: Self-pay

## 2021-06-04 NOTE — Telephone Encounter (Signed)
Pts wife is calling she states that they had the second booster, and Chase [name] leg is weak ..please call the pt

## 2021-06-05 NOTE — Telephone Encounter (Signed)
A few days ago he was weaker in his legs and having a hard time walking to bed. Got the booster on Monday and wants to know if it could have made a difference and if you thought omega 3 fish oil could help or if you thought he needed some physical therapy

## 2021-06-06 DIAGNOSIS — D631 Anemia in chronic kidney disease: Secondary | ICD-10-CM | POA: Diagnosis not present

## 2021-06-06 DIAGNOSIS — N184 Chronic kidney disease, stage 4 (severe): Secondary | ICD-10-CM | POA: Diagnosis not present

## 2021-06-06 NOTE — Telephone Encounter (Signed)
Sally - aware

## 2021-06-20 DIAGNOSIS — R809 Proteinuria, unspecified: Secondary | ICD-10-CM | POA: Diagnosis not present

## 2021-06-20 DIAGNOSIS — E1122 Type 2 diabetes mellitus with diabetic chronic kidney disease: Secondary | ICD-10-CM | POA: Diagnosis not present

## 2021-06-20 DIAGNOSIS — E211 Secondary hyperparathyroidism, not elsewhere classified: Secondary | ICD-10-CM | POA: Diagnosis not present

## 2021-06-20 DIAGNOSIS — N185 Chronic kidney disease, stage 5: Secondary | ICD-10-CM | POA: Diagnosis not present

## 2021-06-20 DIAGNOSIS — N189 Chronic kidney disease, unspecified: Secondary | ICD-10-CM | POA: Diagnosis not present

## 2021-06-20 DIAGNOSIS — N184 Chronic kidney disease, stage 4 (severe): Secondary | ICD-10-CM | POA: Diagnosis not present

## 2021-06-20 DIAGNOSIS — E872 Acidosis: Secondary | ICD-10-CM | POA: Diagnosis not present

## 2021-06-20 DIAGNOSIS — I129 Hypertensive chronic kidney disease with stage 1 through stage 4 chronic kidney disease, or unspecified chronic kidney disease: Secondary | ICD-10-CM | POA: Diagnosis not present

## 2021-06-20 DIAGNOSIS — I5032 Chronic diastolic (congestive) heart failure: Secondary | ICD-10-CM | POA: Diagnosis not present

## 2021-06-20 DIAGNOSIS — D631 Anemia in chronic kidney disease: Secondary | ICD-10-CM | POA: Diagnosis not present

## 2021-06-20 DIAGNOSIS — E1129 Type 2 diabetes mellitus with other diabetic kidney complication: Secondary | ICD-10-CM | POA: Diagnosis not present

## 2021-06-27 ENCOUNTER — Other Ambulatory Visit: Payer: Self-pay | Admitting: "Endocrinology

## 2021-06-30 ENCOUNTER — Other Ambulatory Visit: Payer: Self-pay | Admitting: Nurse Practitioner

## 2021-06-30 DIAGNOSIS — E038 Other specified hypothyroidism: Secondary | ICD-10-CM

## 2021-07-01 ENCOUNTER — Telehealth: Payer: Self-pay

## 2021-07-01 NOTE — Telephone Encounter (Signed)
Patient spouse called need med refills  olopatadine (PATANOL) 0.1 % ophthalmic solution  Pharmacy  [facility], [place]  [address], [place] [address]  Phone:  [phone]  Fax:  [phone]      Also patient spouse said [facility] called and said need med refill but needed Dr Moshe Cipro to give them a call on med refill, patient spouse did not know the name of medicines.

## 2021-07-02 ENCOUNTER — Other Ambulatory Visit: Payer: Self-pay

## 2021-07-02 DIAGNOSIS — H04129 Dry eye syndrome of unspecified lacrimal gland: Secondary | ICD-10-CM

## 2021-07-02 MED ORDER — OLOPATADINE HCL 0.1 % OP SOLN
OPHTHALMIC | 12 refills | Status: DC
Start: 2021-07-02 — End: 2023-03-29

## 2021-07-02 NOTE — Telephone Encounter (Signed)
Rx sent in

## 2021-07-04 DIAGNOSIS — N184 Chronic kidney disease, stage 4 (severe): Secondary | ICD-10-CM | POA: Diagnosis not present

## 2021-07-04 DIAGNOSIS — D631 Anemia in chronic kidney disease: Secondary | ICD-10-CM | POA: Diagnosis not present

## 2021-07-15 DIAGNOSIS — Z79899 Other long term (current) drug therapy: Secondary | ICD-10-CM | POA: Diagnosis not present

## 2021-07-15 DIAGNOSIS — E1142 Type 2 diabetes mellitus with diabetic polyneuropathy: Secondary | ICD-10-CM | POA: Diagnosis not present

## 2021-07-15 DIAGNOSIS — R2689 Other abnormalities of gait and mobility: Secondary | ICD-10-CM | POA: Diagnosis not present

## 2021-07-15 DIAGNOSIS — G40119 Localization-related (focal) (partial) symptomatic epilepsy and epileptic syndromes with simple partial seizures, intractable, without status epilepticus: Secondary | ICD-10-CM | POA: Diagnosis not present

## 2021-07-18 DIAGNOSIS — N184 Chronic kidney disease, stage 4 (severe): Secondary | ICD-10-CM | POA: Diagnosis not present

## 2021-07-28 ENCOUNTER — Other Ambulatory Visit: Payer: Self-pay | Admitting: Family Medicine

## 2021-08-01 DIAGNOSIS — E1129 Type 2 diabetes mellitus with other diabetic kidney complication: Secondary | ICD-10-CM | POA: Diagnosis not present

## 2021-08-01 DIAGNOSIS — E1122 Type 2 diabetes mellitus with diabetic chronic kidney disease: Secondary | ICD-10-CM | POA: Diagnosis not present

## 2021-08-01 DIAGNOSIS — N189 Chronic kidney disease, unspecified: Secondary | ICD-10-CM | POA: Diagnosis not present

## 2021-08-01 DIAGNOSIS — I5032 Chronic diastolic (congestive) heart failure: Secondary | ICD-10-CM | POA: Diagnosis not present

## 2021-08-01 DIAGNOSIS — D631 Anemia in chronic kidney disease: Secondary | ICD-10-CM | POA: Diagnosis not present

## 2021-08-01 DIAGNOSIS — I129 Hypertensive chronic kidney disease with stage 1 through stage 4 chronic kidney disease, or unspecified chronic kidney disease: Secondary | ICD-10-CM | POA: Diagnosis not present

## 2021-08-01 DIAGNOSIS — R809 Proteinuria, unspecified: Secondary | ICD-10-CM | POA: Diagnosis not present

## 2021-08-01 DIAGNOSIS — N184 Chronic kidney disease, stage 4 (severe): Secondary | ICD-10-CM | POA: Diagnosis not present

## 2021-08-01 DIAGNOSIS — N185 Chronic kidney disease, stage 5: Secondary | ICD-10-CM | POA: Diagnosis not present

## 2021-08-06 DIAGNOSIS — Z7902 Long term (current) use of antithrombotics/antiplatelets: Secondary | ICD-10-CM | POA: Diagnosis not present

## 2021-08-06 DIAGNOSIS — I10 Essential (primary) hypertension: Secondary | ICD-10-CM | POA: Diagnosis not present

## 2021-08-06 DIAGNOSIS — J9811 Atelectasis: Secondary | ICD-10-CM | POA: Diagnosis not present

## 2021-08-06 DIAGNOSIS — E119 Type 2 diabetes mellitus without complications: Secondary | ICD-10-CM | POA: Diagnosis not present

## 2021-08-06 DIAGNOSIS — R54 Age-related physical debility: Secondary | ICD-10-CM | POA: Diagnosis not present

## 2021-08-06 DIAGNOSIS — R531 Weakness: Secondary | ICD-10-CM | POA: Diagnosis not present

## 2021-08-06 DIAGNOSIS — I517 Cardiomegaly: Secondary | ICD-10-CM | POA: Diagnosis not present

## 2021-08-06 DIAGNOSIS — E039 Hypothyroidism, unspecified: Secondary | ICD-10-CM | POA: Diagnosis not present

## 2021-08-06 DIAGNOSIS — R790 Abnormal level of blood mineral: Secondary | ICD-10-CM | POA: Diagnosis not present

## 2021-08-06 DIAGNOSIS — Z794 Long term (current) use of insulin: Secondary | ICD-10-CM | POA: Diagnosis not present

## 2021-08-06 DIAGNOSIS — Z9181 History of falling: Secondary | ICD-10-CM | POA: Diagnosis not present

## 2021-08-06 DIAGNOSIS — E785 Hyperlipidemia, unspecified: Secondary | ICD-10-CM | POA: Diagnosis not present

## 2021-08-06 DIAGNOSIS — Z79890 Hormone replacement therapy (postmenopausal): Secondary | ICD-10-CM | POA: Diagnosis not present

## 2021-08-06 DIAGNOSIS — N289 Disorder of kidney and ureter, unspecified: Secondary | ICD-10-CM | POA: Diagnosis not present

## 2021-08-06 DIAGNOSIS — R7989 Other specified abnormal findings of blood chemistry: Secondary | ICD-10-CM | POA: Diagnosis not present

## 2021-08-06 DIAGNOSIS — R030 Elevated blood-pressure reading, without diagnosis of hypertension: Secondary | ICD-10-CM | POA: Diagnosis not present

## 2021-08-06 DIAGNOSIS — Z79899 Other long term (current) drug therapy: Secondary | ICD-10-CM | POA: Diagnosis not present

## 2021-08-06 DIAGNOSIS — U071 COVID-19: Secondary | ICD-10-CM | POA: Diagnosis not present

## 2021-08-06 DIAGNOSIS — R4182 Altered mental status, unspecified: Secondary | ICD-10-CM | POA: Diagnosis not present

## 2021-08-07 DIAGNOSIS — R4182 Altered mental status, unspecified: Secondary | ICD-10-CM | POA: Diagnosis not present

## 2021-08-07 DIAGNOSIS — I517 Cardiomegaly: Secondary | ICD-10-CM | POA: Diagnosis not present

## 2021-08-07 DIAGNOSIS — J9811 Atelectasis: Secondary | ICD-10-CM | POA: Diagnosis not present

## 2021-08-08 ENCOUNTER — Other Ambulatory Visit: Payer: Self-pay

## 2021-08-08 ENCOUNTER — Other Ambulatory Visit: Payer: Self-pay | Admitting: "Endocrinology

## 2021-08-08 ENCOUNTER — Encounter: Payer: Self-pay | Admitting: Nurse Practitioner

## 2021-08-08 ENCOUNTER — Ambulatory Visit (INDEPENDENT_AMBULATORY_CARE_PROVIDER_SITE_OTHER): Payer: Medicare Other | Admitting: Nurse Practitioner

## 2021-08-08 VITALS — Temp 100.0°F

## 2021-08-08 DIAGNOSIS — U071 COVID-19: Secondary | ICD-10-CM | POA: Diagnosis not present

## 2021-08-08 MED ORDER — MOLNUPIRAVIR EUA 200MG CAPSULE
4.0000 | ORAL_CAPSULE | Freq: Two times a day (BID) | ORAL | 0 refills | Status: AC
Start: 2021-08-08 — End: 2021-08-13

## 2021-08-08 MED ORDER — PHENOL 1.4 % MT LIQD: 1.0000 | OROMUCOSAL | 0 refills | Status: DC | PRN

## 2021-08-08 NOTE — Assessment & Plan Note (Addendum)
-  tested positive 8/31 -was treated with z-pack and albuterol at ED -Rx. Chloraseptic throat spray for sore throat -Rx. molnupiravir for COVID- no renal dosage adjustment required per [facility]; (no Paxlovid d/t CKD-4, contraindicated with GFR < 30, and his is 18) -sent to [facility], if this is unavailable, would consider [facility]

## 2021-08-08 NOTE — Progress Notes (Signed)
Acute Office Visit  Subjective:    Patient ID: Chase [name], male    DOB: 12-07-1931, 87 y.o.   MRN: [id number]  Chief Complaint  Patient presents with   Covid Positive    Tested positive on 08/06/21. Has sore throat and body aches, productive cough and congestion.     HPI Patient is in today for sick visit. He tested positive for COVID on a home test on 08/06/21. His symptoms started on 08/05/21  His last GFR was 18.  He has weakness and sore throat.  He took a z-pack and albuterol at the ED, and he has taken tylenol for sore throat.  Past Medical History:  Diagnosis Date   Bradycardia 03/15/2012   Carotid artery occlusion    Choledocholithiasis 02/25/2018   CKD (chronic kidney disease) stage 4, GFR 15-29 ml/min (HCC)    Complete lesion of cervical spinal cord ([place]) 03/14/3012   Stable since 2006   CVA (cerebrovascular accident) ([place]) 09/07/12   right sided weakness   Depressive disorder, not elsewhere classified    Diabetes mellitus approx 1994   Diabetic neuropathy ([place])    History of kidney stones    Hypertensive heart disease    Hypothyroidism approx 2000   Lacunar stroke, acute ([place]) 03/14/2012   Obesity    Osteoarthrosis, unspecified whether generalized or localized, lower leg    Other and unspecified hyperlipidemia    Peripheral vascular disease, unspecified ([place])    Seizures ([place])    unknown etiology; on meds, last seizure was 2015   Spinal stenosis, unspecified region other than cervical    Spondylosis of unspecified site without mention of myelopathy     Past Surgical History:  Procedure Laterality Date   BILIARY STENT PLACEMENT N/A 05/27/2018   Procedure: BILIARY STENT PLACEMENT;  Surgeon: Rogene Houston, MD;  [facility];  Service: Endoscopy;  Laterality: N/A;   COLONOSCOPY N/A 08/10/2013   Procedure: COLONOSCOPY;  Surgeon: Rogene Houston, MD;  [facility];  Service: Endoscopy;  Laterality: N/A;  240   ERCP N/A 03/02/2018   Procedure:  ENDOSCOPIC RETROGRADE CHOLANGIOPANCREATOGRAPHY (ERCP) With sphincterotomy and stent placement;  Surgeon: Rogene Houston, MD;  [facility];  Service: Gastroenterology;  Laterality: N/A;   ERCP N/A 05/27/2018   Procedure: ENDOSCOPIC RETROGRADE CHOLANGIOPANCREATOGRAPHY (ERCP);  Surgeon: Rogene Houston, MD;  [facility];  Service: Endoscopy;  Laterality: N/A;   ERCP N/A 09/16/2018   Procedure: ENDOSCOPIC RETROGRADE CHOLANGIOPANCREATOGRAPHY (ERCP);  Surgeon: Rogene Houston, MD;  [facility];  Service: Endoscopy;  Laterality: N/A;   GASTROINTESTINAL STENT REMOVAL N/A 05/27/2018   Procedure: Biliary STENT REMOVAL;  Surgeon: Rogene Houston, MD;  [facility];  Service: Endoscopy;  Laterality: N/A;   GASTROINTESTINAL STENT REMOVAL N/A 09/16/2018   Procedure: GASTROINTESTINAL STENT REMOVAL;  Surgeon: Rogene Houston, MD;  [facility];  Service: Endoscopy;  Laterality: N/A;   kidney stones left x2  1975   KIDNEY SURGERY     Ruptured left kidney 30 yrs ago  from a kidney stone   LITHOTRIPSY N/A 05/27/2018   Procedure: MECHANICAL LITHOTRIPSY;  Surgeon: Rogene Houston, MD;  [facility];  Service: Endoscopy;  Laterality: N/A;   REMOVAL OF STONES N/A 09/16/2018   Procedure: REMOVAL OF MULTIPLE STONES WITH BASKET AND BALLOON;  Surgeon: Rogene Houston, MD;  [facility];  Service: Endoscopy;  Laterality: N/A;   SPHINCTEROTOMY N/A 05/27/2018  Procedure: SPHINCTEROTOMY extended;  Surgeon: Rogene Houston, MD;  [facility];  Service: Endoscopy;  Laterality: N/A;   SPYGLASS CHOLANGIOSCOPY N/A 05/27/2018   Procedure: SPYGLASS CHOLANGIOSCOPY;  Surgeon: Rogene Houston, MD;  [facility];  Service: Endoscopy;  Laterality: N/A;    Family History  Problem Relation Age of Onset   Diabetes Mother    Prostate cancer Father    Hypertension Brother    Hypertension Brother    Diabetes Brother    Stroke  Brother    Diabetes Brother    Diabetes Daughter    Diabetes Daughter     Social History   Socioeconomic History   Marital status: Married    Spouse name: Not on file   Number of children: 5   Years of education: 8   Highest education level: 8th grade  Occupational History   Occupation: retired     Fish farm manager: RETIRED  Tobacco Use   Smoking status: Never   Smokeless tobacco: Never  Vaping Use   Vaping Use: Never used  Substance and Sexual Activity   Alcohol use: No    Alcohol/week: 0.0 standard drinks   Drug use: No   Sexual activity: Not Currently  Other Topics Concern   Not on file  Social History Narrative   Not on file   Social Determinants of Health   Financial Resource Strain: Low Risk    Difficulty of Paying Living Expenses: Not very hard  Food Insecurity: No Food Insecurity   Worried About Charity fundraiser in the Last Year: Never true   [place] in the Last Year: Never true  Transportation Needs: No Transportation Needs   Lack of Transportation (Medical): No   Lack of Transportation (Non-Medical): No  Physical Activity: Inactive   Days of Exercise per Week: 0 days   Minutes of Exercise per Session: 0 min  Stress: No Stress Concern Present   Feeling of Stress : Not at all  Social Connections: Moderately Integrated   Frequency of Communication with Friends and Family: Twice a week   Frequency of Social Gatherings with Friends and Family: Three times a week   Attends Religious Services: 1 to 4 times per year   Active Member of Clubs or Organizations: No   Attends Archivist Meetings: Never   Marital Status: Married  Human resources officer Violence: Not At Risk   Fear of Current or Ex-Partner: No   Emotionally Abused: No   Physically Abused: No   Sexually Abused: No    Outpatient Medications Prior to Visit  Medication Sig Dispense Refill   amLODipine (NORVASC) 10 MG tablet TAKE 1 TABLET DAILY 90 tablet 3   calcium carbonate (TUMS - DOSED  IN MG ELEMENTAL CALCIUM) 500 MG chewable tablet Chew 1 tablet by mouth 2 (two) times daily. For Bone Health     clopidogrel (PLAVIX) 75 MG tablet TAKE 1 TABLET DAILY 90 tablet 3   diphenoxylate-atropine (LOMOTIL) 2.5-0.025 MG tablet Take one tablet by mouth once daily, as needed, for watery, loose stool 30 tablet 2   diphenoxylate-atropine (LOMOTIL) 2.5-0.025 MG tablet Take one tablet by mouth every  day, as needed, for loose stool 30 tablet 2   glucose blood (ONETOUCH ULTRA) test strip USE 1 STRIP THREE TIMES A DAY AS INSTRUCTED. 300 strip 3   HUMULIN 70/30 KWIKPEN (70-30) 100 UNIT/ML KwikPen INJECT 10 UNITS UNDER THE SKIN TWICE A DAY 30 mL 3   Insulin Pen Needle (B-D ULTRAFINE  III SHORT PEN) 31G X 8 MM MISC 1 each by Does not apply route as directed. 300 each 1   levETIRAcetam (KEPPRA) 500 MG tablet Take 250 mg by mouth at bedtime.      levothyroxine (SYNTHROID) 112 MCG tablet TAKE 1 TABLET DAILY BEFORE BREAKFAST 90 tablet 0   Multiple Vitamins-Minerals (MULTIVITAMIN WITH MINERALS) tablet Take 1 tablet by mouth daily.     olopatadine (PATANOL) 0.1 % ophthalmic solution INSTILL 1 DROP INTO EACH EYE TWICE DAILY 5 mL 12   OneTouch Delica Lancets 68L MISC 1 each by Other route 2 (two) times daily. 100 each 2   polyethylene glycol powder (GLYCOLAX/MIRALAX) 17 GM/SCOOP powder Take 17 g by mouth daily. 3350 g 3   pravastatin (PRAVACHOL) 20 MG tablet TAKE 1 TABLET DAILY 90 tablet 3   tamsulosin (FLOMAX) 0.4 MG CAPS capsule Take 1 capsule (0.4 mg total) by mouth daily. 90 capsule 3   Vitamin D, Ergocalciferol, (DRISDOL) 1.25 MG (50000 UNIT) CAPS capsule Take 50,000 Units by mouth once a week.     zinc gluconate 50 MG tablet Take by mouth.     No facility-administered medications prior to visit.    Allergies  Allergen Reactions   Bayer Advanced Aspirin [Aspirin] Nausea And Vomiting   Penicillins Nausea And Vomiting    Has patient had a PCN reaction causing immediate rash, facial/tongue/throat  swelling, SOB or lightheadedness with hypotension: unknown Has patient had a PCN reaction causing severe rash involving mucus membranes or skin necrosis: unknown Has patient had a PCN reaction that required hospitalization: unknown Has patient had a PCN reaction occurring within the last 10 years: no If all of the above answers are "NO", then may proceed with Cephalosporin use.    Review of Systems  Constitutional:  Positive for fatigue.  HENT:  Positive for congestion.   Respiratory:  Positive for cough. Negative for shortness of breath and wheezing.       Objective:    Physical Exam  Temp 100 F (37.8 C)  Wt Readings from Last 3 Encounters:  10/03/20 177 lb (80.3 kg)  10/01/20 171 lb (77.6 kg)  08/09/20 171 lb (77.6 kg)    Health Maintenance Due  Topic Date Due   Zoster Vaccines- Shingrix (1 of 2) Never done   URINE MICROALBUMIN  12/22/2019   FOOT EXAM  06/28/2021   INFLUENZA VACCINE  07/07/2021    There are no preventive care reminders to display for this patient.   Lab Results  Component Value Date   TSH 3.030 03/27/2021   Lab Results  Component Value Date   WBC 6.2 05/13/2021   HGB 9.3 (L) 05/13/2021   HCT 30.1 (L) 05/13/2021   MCV 100.7 (H) 05/13/2021   PLT 252 05/13/2021   Lab Results  Component Value Date   NA 139 05/13/2021   K 4.5 05/13/2021   CO2 24 05/13/2021   GLUCOSE 123 (H) 05/13/2021   BUN 37 (H) 05/13/2021   CREATININE 3.25 (H) 05/13/2021   BILITOT 0.6 05/13/2021   ALKPHOS 64 05/13/2021   AST 21 05/13/2021   ALT 13 05/13/2021   PROT 7.0 05/13/2021   ALBUMIN 3.4 (L) 05/13/2021   CALCIUM 9.3 05/13/2021   ANIONGAP 7 05/13/2021   EGFR 15 (L) 03/27/2021   Lab Results  Component Value Date   CHOL 169 03/27/2021   Lab Results  Component Value Date   HDL 81 03/27/2021   Lab Results  Component Value Date   LDLCALC 80 03/27/2021  Lab Results  Component Value Date   TRIG 32 03/27/2021   Lab Results  Component Value Date    CHOLHDL 2.1 03/27/2021   Lab Results  Component Value Date   HGBA1C 5.9 04/03/2021       Assessment & Plan:   Problem List Items Addressed This Visit       Other   IJLTH-[id number] - Primary    -tested positive 8/31 -was treated with z-pack and albuterol at ED -Rx. Chloraseptic throat spray for sore throat -Rx. molnupiravir for COVID- no renal dosage adjustment required per [facility]; (no Paxlovid d/t CKD-4, contraindicated with GFR < 30, and his is 18) -sent to [facility], if this is unavailable, would consider [facility]      Relevant Medications   phenol (CHLORASEPTIC) 1.4 % LIQD   molnupiravir EUA 200 mg CAPS     Meds ordered this encounter  Medications   phenol (CHLORASEPTIC) 1.4 % LIQD    Sig: Use as directed 1 spray in the mouth or throat as needed for throat irritation / pain.    Dispense:  177 mL    Refill:  0   molnupiravir EUA 200 mg CAPS    Sig: Take 4 capsules (800 mg total) by mouth 2 (two) times daily for 5 days.    Dispense:  40 capsule    Refill:  0    Date:  08/08/2021   Location of Patient: Home Location of Provider: Office Consent was obtain for visit to be over via telehealth. I verified that I am speaking with the correct person using two identifiers.  I connected with  [name] on 08/08/21 via telephone and verified that I am speaking with the correct person using two identifiers.   I discussed the limitations of evaluation and management by telemedicine. The patient expressed understanding and agreed to proceed.  Time spent:9 min   Noreene Larsson, NP

## 2021-08-12 ENCOUNTER — Other Ambulatory Visit: Payer: Self-pay | Admitting: Urology

## 2021-08-14 DIAGNOSIS — N184 Chronic kidney disease, stage 4 (severe): Secondary | ICD-10-CM | POA: Diagnosis not present

## 2021-08-22 ENCOUNTER — Other Ambulatory Visit: Payer: Self-pay

## 2021-08-22 ENCOUNTER — Ambulatory Visit (INDEPENDENT_AMBULATORY_CARE_PROVIDER_SITE_OTHER): Payer: Medicare Other | Admitting: Cardiology

## 2021-08-22 ENCOUNTER — Encounter: Payer: Self-pay | Admitting: Cardiology

## 2021-08-22 VITALS — BP 132/76 | HR 78 | Ht 68.0 in | Wt 185.0 lb

## 2021-08-22 DIAGNOSIS — I10 Essential (primary) hypertension: Secondary | ICD-10-CM | POA: Diagnosis not present

## 2021-08-22 DIAGNOSIS — E782 Mixed hyperlipidemia: Secondary | ICD-10-CM

## 2021-08-22 DIAGNOSIS — R9431 Abnormal electrocardiogram [ECG] [EKG]: Secondary | ICD-10-CM | POA: Diagnosis not present

## 2021-08-22 DIAGNOSIS — I779 Disorder of arteries and arterioles, unspecified: Secondary | ICD-10-CM

## 2021-08-22 NOTE — Patient Instructions (Signed)
Medication Instructions:  Your physician recommends that you continue on your current medications as directed. Please refer to the Current Medication list given to you today.  *If you need a refill on your cardiac medications before your next appointment, please call your pharmacy*   Lab Work: None today  If you have labs (blood work) drawn today and your tests are completely normal, you will receive your results only by: [place] (if you have MyChart) OR A paper copy in the mail If you have any lab test that is abnormal or we need to change your treatment, we will call you to review the results.   Testing/Procedures: None today   Follow-Up: At [facility], you and your health needs are our priority.  As part of our continuing mission to provide you with exceptional heart care, we have created designated Provider Care Teams.  These Care Teams include your primary Cardiologist (physician) and Advanced Practice Providers (APPs -  Physician Assistants and Nurse Practitioners) who all work together to provide you with the care you need, when you need it.  We recommend signing up for the patient portal called "MyChart".  Sign up information is provided on this After Visit Summary.  MyChart is used to connect with patients for Virtual Visits (Telemedicine).  Patients are able to view lab/test results, encounter notes, upcoming appointments, etc.  Non-urgent messages can be sent to your provider as well.   To learn more about what you can do with MyChart, go to [email or web address].    Your next appointment:   12 month(s)  The format for your next appointment:   In Person  Provider:   Carlyle Dolly, MD   Other Instructions none

## 2021-08-22 NOTE — Progress Notes (Signed)
Clinical Summary Chase [name] is a 87 y.o.male former patient of Dr Bronson Ing, this is our first visit together. Seen for the following medical problems.   1.Abnormal EKG - chronic trifascicular block - no recent lighthenadedss, no dizziness   2.HTN - compliant with meds   3. DM2 - per pcp  4. CVA - he is on plavix, statin  5. CKD - followed by Dr Theador Hawthorne - no recent edema  6. Recent COVID infection 07/2021 - treated with molnupirarvir  7. Carotid stenosis - mild to moderate by 11/2020 [place] - followed by vascular  8. Venous stasis ulcers - followed by vascular  9. HL - 09/2020 TC 165 HDL 78 TG 42 LDL 75 - he is on pravastatin 20mg  daily Past Medical History:  Diagnosis Date   Bradycardia 03/15/2012   Carotid artery occlusion    Choledocholithiasis 02/25/2018   CKD (chronic kidney disease) stage 4, GFR 15-29 ml/min (HCC)    Complete lesion of cervical spinal cord ([place]) 03/14/3012   Stable since 2006   CVA (cerebrovascular accident) ([place]) 09/07/12   right sided weakness   Depressive disorder, not elsewhere classified    Diabetes mellitus approx 1994   Diabetic neuropathy ([place])    History of kidney stones    Hypertensive heart disease    Hypothyroidism approx 2000   Lacunar stroke, acute ([place]) 03/14/2012   Obesity    Osteoarthrosis, unspecified whether generalized or localized, lower leg    Other and unspecified hyperlipidemia    Peripheral vascular disease, unspecified ([place])    Seizures ([place])    unknown etiology; on meds, last seizure was 2015   Spinal stenosis, unspecified region other than cervical    Spondylosis of unspecified site without mention of myelopathy      Allergies  Allergen Reactions   Bayer Advanced Aspirin [Aspirin] Nausea And Vomiting   Penicillins Nausea And Vomiting    Has patient had a PCN reaction causing immediate rash, facial/tongue/throat swelling, SOB or lightheadedness with hypotension: unknown Has patient had a PCN reaction  causing severe rash involving mucus membranes or skin necrosis: unknown Has patient had a PCN reaction that required hospitalization: unknown Has patient had a PCN reaction occurring within the last 10 years: no If all of the above answers are "NO", then may proceed with Cephalosporin use.     Current Outpatient Medications  Medication Sig Dispense Refill   amLODipine (NORVASC) 10 MG tablet TAKE 1 TABLET DAILY 90 tablet 3   calcium carbonate (TUMS - DOSED IN MG ELEMENTAL CALCIUM) 500 MG chewable tablet Chew 1 tablet by mouth 2 (two) times daily. For Bone Health     clopidogrel (PLAVIX) 75 MG tablet TAKE 1 TABLET DAILY 90 tablet 3   diphenoxylate-atropine (LOMOTIL) 2.5-0.025 MG tablet Take one tablet by mouth once daily, as needed, for watery, loose stool 30 tablet 2   diphenoxylate-atropine (LOMOTIL) 2.5-0.025 MG tablet Take one tablet by mouth every  day, as needed, for loose stool 30 tablet 2   glucose blood (ONETOUCH ULTRA) test strip USE 1 STRIP THREE TIMES A DAY AS INSTRUCTED. 300 strip 3   HUMULIN 70/30 KWIKPEN (70-30) 100 UNIT/ML KwikPen INJECT 10 UNITS UNDER THE SKIN TWICE A DAY 30 mL 3   Insulin Pen Needle (B-D ULTRAFINE III SHORT PEN) 31G X 8 MM MISC 1 each by Does not apply route as directed. 300 each 1   levETIRAcetam (KEPPRA) 500 MG tablet Take 250 mg by mouth at bedtime.  levothyroxine (SYNTHROID) 112 MCG tablet TAKE 1 TABLET DAILY BEFORE BREAKFAST 90 tablet 0   Multiple Vitamins-Minerals (MULTIVITAMIN WITH MINERALS) tablet Take 1 tablet by mouth daily.     olopatadine (PATANOL) 0.1 % ophthalmic solution INSTILL 1 DROP INTO EACH EYE TWICE DAILY 5 mL 12   OneTouch Delica Lancets 37T MISC 1 each by Other route 2 (two) times daily. 100 each 2   phenol (CHLORASEPTIC) 1.4 % LIQD Use as directed 1 spray in the mouth or throat as needed for throat irritation / pain. 177 mL 0   polyethylene glycol powder (GLYCOLAX/MIRALAX) 17 GM/SCOOP powder Take 17 g by mouth daily. 3350 g 3    pravastatin (PRAVACHOL) 20 MG tablet TAKE 1 TABLET DAILY 90 tablet 3   tamsulosin (FLOMAX) 0.4 MG CAPS capsule TAKE 1 CAPSULE DAILY 90 capsule 3   Vitamin D, Ergocalciferol, (DRISDOL) 1.25 MG (50000 UNIT) CAPS capsule Take 50,000 Units by mouth once a week.     zinc gluconate 50 MG tablet Take by mouth.     No current facility-administered medications for this visit.     Past Surgical History:  Procedure Laterality Date   BILIARY STENT PLACEMENT N/A 05/27/2018   Procedure: BILIARY STENT PLACEMENT;  Surgeon: Rogene Houston, MD;  [facility];  Service: Endoscopy;  Laterality: N/A;   COLONOSCOPY N/A 08/10/2013   Procedure: COLONOSCOPY;  Surgeon: Rogene Houston, MD;  [facility];  Service: Endoscopy;  Laterality: N/A;  240   ERCP N/A 03/02/2018   Procedure: ENDOSCOPIC RETROGRADE CHOLANGIOPANCREATOGRAPHY (ERCP) With sphincterotomy and stent placement;  Surgeon: Rogene Houston, MD;  [facility];  Service: Gastroenterology;  Laterality: N/A;   ERCP N/A 05/27/2018   Procedure: ENDOSCOPIC RETROGRADE CHOLANGIOPANCREATOGRAPHY (ERCP);  Surgeon: Rogene Houston, MD;  [facility];  Service: Endoscopy;  Laterality: N/A;   ERCP N/A 09/16/2018   Procedure: ENDOSCOPIC RETROGRADE CHOLANGIOPANCREATOGRAPHY (ERCP);  Surgeon: Rogene Houston, MD;  [facility];  Service: Endoscopy;  Laterality: N/A;   GASTROINTESTINAL STENT REMOVAL N/A 05/27/2018   Procedure: Biliary STENT REMOVAL;  Surgeon: Rogene Houston, MD;  [facility];  Service: Endoscopy;  Laterality: N/A;   GASTROINTESTINAL STENT REMOVAL N/A 09/16/2018   Procedure: GASTROINTESTINAL STENT REMOVAL;  Surgeon: Rogene Houston, MD;  [facility];  Service: Endoscopy;  Laterality: N/A;   kidney stones left x2  1975   KIDNEY SURGERY     Ruptured left kidney 30 yrs ago  from a kidney stone   LITHOTRIPSY N/A 05/27/2018   Procedure: MECHANICAL LITHOTRIPSY;  Surgeon: Rogene Houston, MD;  [facility];  Service: Endoscopy;  Laterality: N/A;   REMOVAL OF STONES N/A 09/16/2018   Procedure: REMOVAL OF MULTIPLE STONES WITH BASKET AND BALLOON;  Surgeon: Rogene Houston, MD;  [facility];  Service: Endoscopy;  Laterality: N/A;   SPHINCTEROTOMY N/A 05/27/2018   Procedure: SPHINCTEROTOMY extended;  Surgeon: Rogene Houston, MD;  [facility];  Service: Endoscopy;  Laterality: N/A;   SPYGLASS CHOLANGIOSCOPY N/A 05/27/2018   Procedure: SPYGLASS CHOLANGIOSCOPY;  Surgeon: Rogene Houston, MD;  [facility];  Service: Endoscopy;  Laterality: N/A;     Allergies  Allergen Reactions   Bayer Advanced Aspirin [Aspirin] Nausea And Vomiting   Penicillins Nausea And Vomiting    Has patient had a PCN reaction causing immediate rash, facial/tongue/throat swelling, SOB or lightheadedness with hypotension: unknown Has patient had a PCN  reaction causing severe rash involving mucus membranes or skin necrosis: unknown Has patient had a PCN reaction that required hospitalization: unknown Has patient had a PCN reaction occurring within the last 10 years: no If all of the above answers are "NO", then may proceed with Cephalosporin use.      Family History  Problem Relation Age of Onset   Diabetes Mother    Prostate cancer Father    Hypertension Brother    Hypertension Brother    Diabetes Brother    Stroke Brother    Diabetes Brother    Diabetes Daughter    Diabetes Daughter      Social History Mr. Carver reports that he has never smoked. He has never used smokeless tobacco. Mr. Karwowski reports no history of alcohol use.   Review of Systems CONSTITUTIONAL: No weight loss, fever, chills, weakness or fatigue.  HEENT: Eyes: No visual loss, blurred vision, double vision or yellow sclerae.No hearing loss, sneezing, congestion, runny nose or sore throat.  SKIN: No rash or itching.  CARDIOVASCULAR: per hpi RESPIRATORY: No shortness of  breath, cough or sputum.  GASTROINTESTINAL: No anorexia, nausea, vomiting or diarrhea. No abdominal pain or blood.  GENITOURINARY: No burning on urination, no polyuria NEUROLOGICAL: No headache, dizziness, syncope, paralysis, ataxia, numbness or tingling in the extremities. No change in bowel or bladder control.  MUSCULOSKELETAL: No muscle, back pain, joint pain or stiffness.  LYMPHATICS: No enlarged nodes. No history of splenectomy.  PSYCHIATRIC: No history of depression or anxiety.  ENDOCRINOLOGIC: No reports of sweating, cold or heat intolerance. No polyuria or polydipsia.  Marland Kitchen   Physical Examination Today's Vitals   08/22/21 1117  BP: 132/76  Pulse: 78  SpO2: 98%  Weight: 185 lb (83.9 kg)  Height: 5\' 8"  (1.727 m)   Body mass index is 28.13 kg/m.  Gen: resting comfortably, no acute distress HEENT: no scleral icterus, pupils equal round and reactive, no palptable cervical adenopathy,  CV: RRR, no m/r/g no jvd Resp: Clear to auscultation bilaterally GI: abdomen is soft, non-tender, non-distended, normal bowel sounds, no hepatosplenomegaly MSK: extremities are warm, no edema.  Skin: warm, no rash Neuro:  no focal deficits Psych: appropriate affect   Diagnostic Studies  11/2020 carotid US Summary:  Right Carotid: Velocities in the right ICA are consistent with a 1-39%  stenosis.   Left Carotid: Velocities in the left ICA are consistent with a 40-59%  stenosis.   Assessment and Plan  1.Abnormal EKG - trifascicular block, no evidence of bradycardia. No symptoms - continue to monitor  2. Hyperlipidemia - LDL esssentially at goal, would look for LDL <70. If trends up room to totrate statin  3. HTN '- at goal, continue current meds  4. Carotid stenosis - mild to moderate, followed by vascular - continue plavix, statin    Arnoldo Lenis, M.D.

## 2021-08-28 ENCOUNTER — Telehealth: Payer: Self-pay

## 2021-08-28 NOTE — Telephone Encounter (Signed)
Patient spouse called needs a prescription for a mattress to send into [facility].

## 2021-08-29 DIAGNOSIS — D631 Anemia in chronic kidney disease: Secondary | ICD-10-CM | POA: Diagnosis not present

## 2021-08-29 DIAGNOSIS — N184 Chronic kidney disease, stage 4 (severe): Secondary | ICD-10-CM | POA: Diagnosis not present

## 2021-08-29 NOTE — Telephone Encounter (Signed)
Left message for more information? What type of mattress.

## 2021-09-03 ENCOUNTER — Telehealth: Payer: Self-pay | Admitting: Family Medicine

## 2021-09-03 NOTE — Telephone Encounter (Signed)
FMLA   Copied Noted sleeved 

## 2021-09-12 DIAGNOSIS — N185 Chronic kidney disease, stage 5: Secondary | ICD-10-CM | POA: Diagnosis not present

## 2021-09-12 DIAGNOSIS — D631 Anemia in chronic kidney disease: Secondary | ICD-10-CM | POA: Diagnosis not present

## 2021-09-12 DIAGNOSIS — E211 Secondary hyperparathyroidism, not elsewhere classified: Secondary | ICD-10-CM | POA: Diagnosis not present

## 2021-09-12 DIAGNOSIS — I5032 Chronic diastolic (congestive) heart failure: Secondary | ICD-10-CM | POA: Diagnosis not present

## 2021-09-12 DIAGNOSIS — N184 Chronic kidney disease, stage 4 (severe): Secondary | ICD-10-CM | POA: Diagnosis not present

## 2021-09-12 DIAGNOSIS — E1129 Type 2 diabetes mellitus with other diabetic kidney complication: Secondary | ICD-10-CM | POA: Diagnosis not present

## 2021-09-12 DIAGNOSIS — N189 Chronic kidney disease, unspecified: Secondary | ICD-10-CM | POA: Diagnosis not present

## 2021-09-12 DIAGNOSIS — D508 Other iron deficiency anemias: Secondary | ICD-10-CM | POA: Diagnosis not present

## 2021-09-12 DIAGNOSIS — I129 Hypertensive chronic kidney disease with stage 1 through stage 4 chronic kidney disease, or unspecified chronic kidney disease: Secondary | ICD-10-CM | POA: Diagnosis not present

## 2021-09-12 DIAGNOSIS — E1122 Type 2 diabetes mellitus with diabetic chronic kidney disease: Secondary | ICD-10-CM | POA: Diagnosis not present

## 2021-09-12 DIAGNOSIS — R809 Proteinuria, unspecified: Secondary | ICD-10-CM | POA: Diagnosis not present

## 2021-09-25 ENCOUNTER — Other Ambulatory Visit: Payer: Self-pay

## 2021-09-25 ENCOUNTER — Encounter: Payer: Self-pay | Admitting: Family Medicine

## 2021-09-25 ENCOUNTER — Ambulatory Visit (INDEPENDENT_AMBULATORY_CARE_PROVIDER_SITE_OTHER): Payer: Medicare Other | Admitting: Family Medicine

## 2021-09-25 VITALS — BP 153/69 | HR 70 | Resp 16 | Ht 68.0 in | Wt 166.4 lb

## 2021-09-25 DIAGNOSIS — R32 Unspecified urinary incontinence: Secondary | ICD-10-CM

## 2021-09-25 DIAGNOSIS — E038 Other specified hypothyroidism: Secondary | ICD-10-CM

## 2021-09-25 DIAGNOSIS — Z9181 History of falling: Secondary | ICD-10-CM

## 2021-09-25 DIAGNOSIS — I639 Cerebral infarction, unspecified: Secondary | ICD-10-CM | POA: Diagnosis not present

## 2021-09-25 DIAGNOSIS — E1059 Type 1 diabetes mellitus with other circulatory complications: Secondary | ICD-10-CM | POA: Diagnosis not present

## 2021-09-25 DIAGNOSIS — E1121 Type 2 diabetes mellitus with diabetic nephropathy: Secondary | ICD-10-CM

## 2021-09-25 DIAGNOSIS — E1021 Type 1 diabetes mellitus with diabetic nephropathy: Secondary | ICD-10-CM

## 2021-09-25 DIAGNOSIS — Z794 Long term (current) use of insulin: Secondary | ICD-10-CM | POA: Diagnosis not present

## 2021-09-25 NOTE — Progress Notes (Signed)
ROBERTA KELLY     MRN: [id number]      DOB: [birth year]   HPI Mr. Norgaard is here for follow up and re-evaluation of chronic medical conditions, medication management and review of any available recent lab and radiology data.  Preventive health is updated, specifically  Cancer screening and Immunization.   Questions or concerns regarding consultations or procedures which the PT has had in the interim are  addressed. The PT denies any adverse reactions to current medications since the last visit.  Needs new mattress , current one is over 13 years old, current is torn and soft has a hospital bed which he needs because of severe arthritis with debilitating pain, and also heart failure Chuck needed for use in bed due to incontinence and depends Has marked fluctuation in blood suagr, reports varies from 50 to over 300, wife states very difficult to prick fingers andrequests alternate testing system Son accompanies him to visit an dis requesting FMLA form completion to assist with his Dad C/o weakness and reduced mobility with joint stiffness and weakness, patient and family request in home health for PT this has helped in the past  ROS Denies recent fever or chills. Denies sinus pressure, nasal congestion, ear pain or sore throat. Denies chest congestion, productive cough or wheezing. Denies chest pains, palpitations and leg swelling Denies abdominal pain, nausea, vomiting,diarrhea or constipation.   Denies dysuria, frequency,  c/o hesitancy and incontinence. C/o  headaches, seizures, numbness, or tingling. Denies depression, anxiety or insomnia. Denies skin break down or rash.   PE  BP (!) 153/69   Pulse 70   Resp 16   Ht 5\' 8"  (1.727 m)   Wt 166 lb 6.4 oz (75.5 kg)   SpO2 97%   BMI 25.30 kg/m   Patient alert and oriented and in no cardiopulmonary distress.  HEENT: No facial asymmetry, EOMI,     Neck decreased ROM.  Chest: Clear to auscultation bilaterally.  CVS: S1, S2 no  murmurs, no S3.Regular rate.  ABD: Soft non tender.   Ext: No edema  MS: decreased  ROM spine, shoulders, hips and knees.  Skin: Intact, no ulcerations or rash noted.  Psych: Good eye contact, normal affect. Memory impaired  not anxious or depressed appearing.  CNS: CN 2-12 intact, power,  normal throughout.no focal deficits noted.   Assessment & Plan  Type 1 diabetes mellitus with vascular disease [facility]) Mr. Whiters is reminded of the importance of commitment to daily physical activity for 30 minutes or more, as able and the need to limit carbohydrate intake to 30 to 60 grams per meal to help with blood sugar control.   The need to take medication as prescribed, test blood sugar as directed, and to call between visits if there is a concern that blood sugar is uncontrolled is also discussed.   Mr. Kolenovic is reminded of the importance of daily foot exam, annual eye examination, and good blood sugar, blood pressure and cholesterol control. Updated lab needed at/ before next visit. Managed by Endo, reports marked fluctuation on blood sugar, refer pharmacy for alternate testing system  Diabetic Labs Latest Ref Rng & Units 05/13/2021 04/03/2021 03/27/2021 10/03/2020 10/01/2020  HbA1c 0.0 - 7.0 % - 5.9 - 6.3(A) -  Microalbumin Not Estab. ug/mL - - - - -  Micro/Creat Ratio 0.0 - 30.0 mg/g creat - - - - -  Chol 100 - 199 mg/dL - - 169 - 165  HDL >39 mg/dL - - 81 -  78  Calc LDL 0 - 99 mg/dL - - 80 - 75  Triglycerides 0 - 149 mg/dL - - 32 - 42  Creatinine 0.61 - 1.24 mg/dL 3.25(H) - 3.67(H) - 3.15(H)   BP/Weight 09/25/2021 08/22/2021 05/14/2021 04/03/2021 03/20/2021 11/12/2020 62/37/6283  Systolic BP 151 761 607 371 062 694 854  Diastolic BP 69 76 69 75 77 64 77  Wt. (Lbs) 166.4 185 - - - - 177  BMI 25.3 28.13 - - - - 26.91   Foot/eye exam completion dates Latest Ref Rng & Units 12/20/2018 02/25/2017  Eye Exam No Retinopathy - -  Foot exam Order - - -  Foot Form Completion - Done Done         Hypothyroidism Controlled and managed by Endo  Incontinence Excess bed wetiting and due to poor ambulation episodes of wetting occur  During daytime also. Request for incontinence supplies  Hx of falling Report of increaed weakness and poor mobility, refer for in home PT twice weekly x 6 weeks

## 2021-09-25 NOTE — Telephone Encounter (Signed)
Forms completed at office visit and patient took with him.

## 2021-09-25 NOTE — Patient Instructions (Signed)
F/U early March, call if you need me sooner  Flu vaccine in office today.  You need to get your Shingrix vaccines at your pharmacy.  You are being referred to the pharmacist to see if an alternate blood sugar testing system can be approved for your use since you have highs and lows.  Order will be sent for mattress as well as incontinence supplies to your supplier.  Foot exam shows very poor sensation and vibration.  It is important that you keep your feet protected at all times and check your feet every day.  Thanks for choosing [facility], we consider it a privelige to serve you.

## 2021-09-26 ENCOUNTER — Telehealth: Payer: Self-pay | Admitting: *Deleted

## 2021-09-26 DIAGNOSIS — N184 Chronic kidney disease, stage 4 (severe): Secondary | ICD-10-CM | POA: Diagnosis not present

## 2021-09-26 DIAGNOSIS — D631 Anemia in chronic kidney disease: Secondary | ICD-10-CM | POA: Diagnosis not present

## 2021-09-26 NOTE — Chronic Care Management (AMB) (Signed)
  Chronic Care Management   Note  09/26/2021 Name: Chase [name] MRN: [id number] DOB: 1930/12/22  Chase [name] is a 87 y.o. year old male who is a primary care patient of Moshe Cipro Norwood Levo, MD. I reached out to [name] by phone today in response to a referral sent by Chase [name] [facility] PCP.  Chase [name] was given information about Chronic Care Management services today including:  CCM service includes personalized support from designated clinical staff supervised by his physician, including individualized plan of care and coordination with other care providers 24/7 contact phone numbers for assistance for urgent and routine care needs. Service will only be billed when office clinical staff spend 20 minutes or more in a month to coordinate care. Only one practitioner may furnish and bill the service in a calendar month. The patient may stop CCM services at any time (effective at the end of the month) by phone call to the office staff. The patient is responsible for co-pay (up to 20% after annual deductible is met) if co-pay is required by the individual health plan.   Patient agreed to services and verbal consent obtained.   Follow up plan: Telephone appointment with care management team member scheduled for:10/14/21  [place] [facility]  Direct Dial: [phone]

## 2021-09-28 ENCOUNTER — Encounter: Payer: Self-pay | Admitting: Family Medicine

## 2021-09-28 NOTE — Assessment & Plan Note (Signed)
Excess bed wetiting and due to poor ambulation episodes of wetting occur  During daytime also. Request for incontinence supplies

## 2021-09-28 NOTE — Assessment & Plan Note (Signed)
Controlled and managed by Endo 

## 2021-09-28 NOTE — Assessment & Plan Note (Signed)
Report of increaed weakness and poor mobility, refer for in home PT twice weekly x 6 weeks

## 2021-09-28 NOTE — Assessment & Plan Note (Signed)
Chase [name] is reminded of the importance of commitment to daily physical activity for 30 minutes or more, as able and the need to limit carbohydrate intake to 30 to 60 grams per meal to help with blood sugar control.   The need to take medication as prescribed, test blood sugar as directed, and to call between visits if there is a concern that blood sugar is uncontrolled is also discussed.   Chase [name] is reminded of the importance of daily foot exam, annual eye examination, and good blood sugar, blood pressure and cholesterol control. Updated lab needed at/ before next visit. Managed by Endo, reports marked fluctuation on blood sugar, refer pharmacy for alternate testing system  Diabetic Labs Latest Ref Rng & Units 05/13/2021 04/03/2021 03/27/2021 10/03/2020 10/01/2020  HbA1c 0.0 - 7.0 % - 5.9 - 6.3(A) -  Microalbumin Not Estab. ug/mL - - - - -  Micro/Creat Ratio 0.0 - 30.0 mg/g creat - - - - -  Chol 100 - 199 mg/dL - - 169 - 165  HDL >39 mg/dL - - 81 - 78  Calc LDL 0 - 99 mg/dL - - 80 - 75  Triglycerides 0 - 149 mg/dL - - 32 - 42  Creatinine 0.61 - 1.24 mg/dL 3.25(H) - 3.67(H) - 3.15(H)   BP/Weight 09/25/2021 08/22/2021 05/14/2021 04/03/2021 03/20/2021 11/12/2020 02/18/9457  Systolic BP 592 924 [phone] 657  Diastolic BP 69 76 69 75 77 64 77  Wt. (Lbs) 166.4 185 - - - - 177  BMI 25.3 28.13 - - - - 26.91   Foot/eye exam completion dates Latest Ref Rng & Units 12/20/2018 02/25/2017  Eye Exam No Retinopathy - -  Foot exam Order - - -  Foot Form Completion - Done Done

## 2021-09-29 ENCOUNTER — Other Ambulatory Visit: Payer: Self-pay | Admitting: Nurse Practitioner

## 2021-09-29 DIAGNOSIS — E038 Other specified hypothyroidism: Secondary | ICD-10-CM

## 2021-09-30 DIAGNOSIS — Z0279 Encounter for issue of other medical certificate: Secondary | ICD-10-CM

## 2021-09-30 LAB — TSH: TSH: 5.67 u[IU]/mL — ABNORMAL HIGH (ref 0.450–4.500)

## 2021-09-30 LAB — T4, FREE: Free T4: 1.25 ng/dL (ref 0.82–1.77)

## 2021-10-02 ENCOUNTER — Encounter (HOSPITAL_COMMUNITY)
Admission: RE | Admit: 2021-10-02 | Discharge: 2021-10-02 | Disposition: A | Discharge status: Home / Self Care | Payer: Medicare Other | Source: Ambulatory Visit | Attending: Nephrology | Admitting: Nephrology

## 2021-10-02 ENCOUNTER — Other Ambulatory Visit: Payer: Self-pay

## 2021-10-02 ENCOUNTER — Encounter (HOSPITAL_COMMUNITY): Payer: Self-pay

## 2021-10-02 ENCOUNTER — Encounter (HOSPITAL_COMMUNITY): Admission: RE | Admit: 2021-10-02 | Payer: Medicare Other | Source: Ambulatory Visit

## 2021-10-02 DIAGNOSIS — D509 Iron deficiency anemia, unspecified: Secondary | ICD-10-CM | POA: Diagnosis not present

## 2021-10-02 MED ORDER — SODIUM CHLORIDE 0.9 % IV SOLN
510.0000 mg | Freq: Once | INTRAVENOUS | Status: AC
  Administered 2021-10-02: 510 mg via INTRAVENOUS
  Filled 2021-10-02: qty 17

## 2021-10-02 MED ORDER — SODIUM CHLORIDE 0.9 % IV SOLN: Freq: Once | INTRAVENOUS | Status: AC

## 2021-10-03 ENCOUNTER — Telehealth: Payer: Self-pay

## 2021-10-03 DIAGNOSIS — E1142 Type 2 diabetes mellitus with diabetic polyneuropathy: Secondary | ICD-10-CM | POA: Diagnosis not present

## 2021-10-03 DIAGNOSIS — B351 Tinea unguium: Secondary | ICD-10-CM | POA: Diagnosis not present

## 2021-10-03 NOTE — Telephone Encounter (Signed)
FMLA forms  Needs additional information Page 3 of 4 # 6 and # 7  Copied Noted Sleeved

## 2021-10-06 ENCOUNTER — Ambulatory Visit (INDEPENDENT_AMBULATORY_CARE_PROVIDER_SITE_OTHER): Payer: Medicare Other | Admitting: Nurse Practitioner

## 2021-10-06 ENCOUNTER — Other Ambulatory Visit: Payer: Self-pay

## 2021-10-06 ENCOUNTER — Encounter: Payer: Self-pay | Admitting: Nurse Practitioner

## 2021-10-06 VITALS — BP 152/70 | HR 63 | Ht 68.0 in

## 2021-10-06 DIAGNOSIS — E1122 Type 2 diabetes mellitus with diabetic chronic kidney disease: Secondary | ICD-10-CM | POA: Diagnosis not present

## 2021-10-06 DIAGNOSIS — Z794 Long term (current) use of insulin: Secondary | ICD-10-CM

## 2021-10-06 DIAGNOSIS — E038 Other specified hypothyroidism: Secondary | ICD-10-CM

## 2021-10-06 DIAGNOSIS — N184 Chronic kidney disease, stage 4 (severe): Secondary | ICD-10-CM | POA: Diagnosis not present

## 2021-10-06 DIAGNOSIS — I10 Essential (primary) hypertension: Secondary | ICD-10-CM | POA: Diagnosis not present

## 2021-10-06 DIAGNOSIS — E782 Mixed hyperlipidemia: Secondary | ICD-10-CM

## 2021-10-06 LAB — POCT GLYCOSYLATED HEMOGLOBIN (HGB A1C): HbA1c, POC (controlled diabetic range): 5.7 % (ref 0.0–7.0)

## 2021-10-06 NOTE — Patient Instructions (Signed)

## 2021-10-06 NOTE — Progress Notes (Signed)
10/06/2021           Endocrinology follow-up note    Subjective:    Patient ID: Chase [name], male    DOB: 1931-04-13, PCP [name], MD   Past Medical History:  Diagnosis Date   Bradycardia 03/15/2012   Carotid artery occlusion    Choledocholithiasis 02/25/2018   CKD (chronic kidney disease) stage 4, GFR 15-29 ml/min (HCC)    Complete lesion of cervical spinal cord ([place]) 03/14/3012   Stable since 2006   CVA (cerebrovascular accident) ([place]) 09/07/12   right sided weakness   Depressive disorder, not elsewhere classified    Diabetes mellitus approx 1994   Diabetic neuropathy ([place])    History of kidney stones    Hypertensive heart disease    Hypothyroidism approx 2000   Lacunar stroke, acute ([place]) 03/14/2012   Obesity    Osteoarthrosis, unspecified whether generalized or localized, lower leg    Other and unspecified hyperlipidemia    Peripheral vascular disease, unspecified ([place])    Seizures ([place])    unknown etiology; on meds, last seizure was 2015   Spinal stenosis, unspecified region other than cervical    Spondylosis of unspecified site without mention of myelopathy    Past Surgical History:  Procedure Laterality Date   BILIARY STENT PLACEMENT N/A 05/27/2018   Procedure: BILIARY STENT PLACEMENT;  Surgeon: Rogene Houston, MD;  [facility];  Service: Endoscopy;  Laterality: N/A;   COLONOSCOPY N/A 08/10/2013   Procedure: COLONOSCOPY;  Surgeon: Rogene Houston, MD;  [facility];  Service: Endoscopy;  Laterality: N/A;  240   ERCP N/A 03/02/2018   Procedure: ENDOSCOPIC RETROGRADE CHOLANGIOPANCREATOGRAPHY (ERCP) With sphincterotomy and stent placement;  Surgeon: Rogene Houston, MD;  [facility];  Service: Gastroenterology;  Laterality: N/A;   ERCP N/A 05/27/2018   Procedure: ENDOSCOPIC RETROGRADE CHOLANGIOPANCREATOGRAPHY (ERCP);  Surgeon: Rogene Houston, MD;  [facility];  Service: Endoscopy;  Laterality: N/A;   ERCP N/A  09/16/2018   Procedure: ENDOSCOPIC RETROGRADE CHOLANGIOPANCREATOGRAPHY (ERCP);  Surgeon: Rogene Houston, MD;  [facility];  Service: Endoscopy;  Laterality: N/A;   GASTROINTESTINAL STENT REMOVAL N/A 05/27/2018   Procedure: Biliary STENT REMOVAL;  Surgeon: Rogene Houston, MD;  [facility];  Service: Endoscopy;  Laterality: N/A;   GASTROINTESTINAL STENT REMOVAL N/A 09/16/2018   Procedure: GASTROINTESTINAL STENT REMOVAL;  Surgeon: Rogene Houston, MD;  [facility];  Service: Endoscopy;  Laterality: N/A;   kidney stones left x2  1975   KIDNEY SURGERY     Ruptured left kidney 30 yrs ago  from a kidney stone   LITHOTRIPSY N/A 05/27/2018   Procedure: MECHANICAL LITHOTRIPSY;  Surgeon: Rogene Houston, MD;  [facility];  Service: Endoscopy;  Laterality: N/A;   REMOVAL OF STONES N/A 09/16/2018   Procedure: REMOVAL OF MULTIPLE STONES WITH BASKET AND BALLOON;  Surgeon: Rogene Houston, MD;  [facility];  Service: Endoscopy;  Laterality: N/A;   SPHINCTEROTOMY N/A 05/27/2018   Procedure: SPHINCTEROTOMY extended;  Surgeon: Rogene Houston, MD;  [facility];  Service: Endoscopy;  Laterality: N/A;   SPYGLASS CHOLANGIOSCOPY N/A 05/27/2018   Procedure: SPYGLASS CHOLANGIOSCOPY;  Surgeon: Rogene Houston, MD;  [facility];  Service: Endoscopy;  Laterality: N/A;   Social History   Socioeconomic History   Marital status: Married    Spouse name: Not on file   Number  of children: 5   Years of education: 8   Highest education level: 8th grade  Occupational History   Occupation: retired     Fish farm manager: RETIRED  Tobacco Use   Smoking status: Never   Smokeless tobacco: Never  Vaping Use   Vaping Use: Never used  Substance and Sexual Activity   Alcohol use: No    Alcohol/week: 0.0 standard drinks   Drug use: No   Sexual activity: Not Currently  Other Topics Concern   Not on file  Social History Narrative   Not on file    Social Determinants of Health   Financial Resource Strain: Not on file  Food Insecurity: Not on file  Transportation Needs: Not on file  Physical Activity: Not on file  Stress: Not on file  Social Connections: Not on file   Outpatient Encounter Medications as of 10/06/2021  Medication Sig   amLODipine (NORVASC) 10 MG tablet TAKE 1 TABLET DAILY   calcium carbonate (TUMS - DOSED IN MG ELEMENTAL CALCIUM) 500 MG chewable tablet Chew 1 tablet by mouth 2 (two) times daily. For Bone Health   clopidogrel (PLAVIX) 75 MG tablet TAKE 1 TABLET DAILY   diphenoxylate-atropine (LOMOTIL) 2.5-0.025 MG tablet Take one tablet by mouth once daily, as needed, for watery, loose stool   diphenoxylate-atropine (LOMOTIL) 2.5-0.025 MG tablet Take one tablet by mouth every  day, as needed, for loose stool   glucose blood (ONETOUCH ULTRA) test strip USE 1 STRIP THREE TIMES A DAY AS INSTRUCTED.   HUMULIN 70/30 KWIKPEN (70-30) 100 UNIT/ML KwikPen INJECT 10 UNITS UNDER THE SKIN TWICE A DAY   hydrALAZINE (APRESOLINE) 25 MG tablet Take 25 mg by mouth in the morning and at bedtime.   Insulin Pen Needle (B-D ULTRAFINE III SHORT PEN) 31G X 8 MM MISC 1 each by Does not apply route as directed.   levETIRAcetam (KEPPRA) 500 MG tablet Take 250 mg by mouth at bedtime.    levothyroxine (SYNTHROID) 112 MCG tablet TAKE 1 TABLET DAILY BEFORE BREAKFAST   Multiple Vitamins-Minerals (MULTIVITAMIN WITH MINERALS) tablet Take 1 tablet by mouth daily.   olopatadine (PATANOL) 0.1 % ophthalmic solution INSTILL 1 DROP INTO EACH EYE TWICE DAILY   OneTouch Delica Lancets 08M MISC 1 each by Other route 2 (two) times daily.   polyethylene glycol powder (GLYCOLAX/MIRALAX) 17 GM/SCOOP powder Take 17 g by mouth daily.   pravastatin (PRAVACHOL) 20 MG tablet TAKE 1 TABLET DAILY   sodium bicarbonate 650 MG tablet Take 650 mg by mouth 3 (three) times daily.   tamsulosin (FLOMAX) 0.4 MG CAPS capsule TAKE 1 CAPSULE DAILY   Vitamin D, Ergocalciferol,  (DRISDOL) 1.25 MG (50000 UNIT) CAPS capsule Take 50,000 Units by mouth once a week.   zinc gluconate 50 MG tablet Take by mouth.   No facility-administered encounter medications on file as of 10/06/2021.   ALLERGIES: Allergies  Allergen Reactions   Bayer Advanced Aspirin [Aspirin] Nausea And Vomiting   Penicillins Nausea And Vomiting    Has patient had a PCN reaction causing immediate rash, facial/tongue/throat swelling, SOB or lightheadedness with hypotension: unknown Has patient had a PCN reaction causing severe rash involving mucus membranes or skin necrosis: unknown Has patient had a PCN reaction that required hospitalization: unknown Has patient had a PCN reaction occurring within the last 10 years: no If all of the above answers are "NO", then may proceed with Cephalosporin use.   VACCINATION STATUS: Immunization History  Administered Date(s) Administered   Fluad Quad(high Dose 65+) 08/07/2019,  10/07/2020   H1N1 11/14/2008   Influenza Split 10/07/2011, 09/08/2012   Influenza Whole 08/22/2007, 08/27/2010   Influenza,inj,Quad PF,6+ Mos 08/22/2013, 09/26/2014, 09/26/2015, 09/01/2016, 09/20/2017, 09/26/2018   Moderna Sars-Covid-2 Vaccination 01/01/2020, 02/01/2020, 01/05/2021, 06/02/2021   Pneumococcal Conjugate-13 06/12/2014   Pneumococcal Polysaccharide-23 05/21/2004   Td 05/21/2004   Tdap 10/07/2011    Diabetes He presents for his follow-up diabetic visit. He has type 2 diabetes mellitus. Onset time: He was diagnosed at approximate age of 21 years. His disease course has been stable. There are no hypoglycemic associated symptoms. Pertinent negatives for hypoglycemia include no confusion, headaches, nervousness/anxiousness, pallor, seizures or tremors. Pertinent negatives for diabetes include no chest pain, no fatigue, no polydipsia, no polyphagia, no polyuria, no weakness and no weight loss. There are no hypoglycemic complications. Symptoms are stable. Diabetic complications  include a CVA, nephropathy, peripheral neuropathy and PVD. Risk factors for coronary artery disease include diabetes mellitus, dyslipidemia, male sex, obesity, sedentary lifestyle and hypertension. Current diabetic treatment includes insulin injections. He is compliant with treatment most of the time. His weight is fluctuating minimally. He is following a diabetic and generally healthy diet. When asked about meal planning, he reported none. He has not had a previous visit with a dietitian. He never participates in exercise. His home blood glucose trend is fluctuating minimally. His breakfast blood glucose range is generally 110-130 mg/dl. His bedtime blood glucose range is generally >200 mg/dl. (He presents today, accompanied by his wife (primary caregiver) and son, with logs showing at goal fasting and significantly above target postprandial glycemic profile.  His POCT A1c today is 5.7%, essentially unchanged from previous visit.  His wife reported she rarely has to give him his morning dose of insulin (has only been giving it to him if glucose was above 150 or so).  This is likely why his evening numbers are so high.  There is no significant hypoglycemia reported.) An ACE inhibitor/angiotensin II receptor blocker is not being taken. He does not see a podiatrist.Eye exam is current.  Hyperlipidemia This is a chronic problem. The current episode started more than 1 year ago. The problem is controlled. Recent lipid tests were reviewed and are normal. Exacerbating diseases include chronic renal disease, diabetes and hypothyroidism. There are no known factors aggravating his hyperlipidemia. Pertinent negatives include no chest pain, myalgias or shortness of breath. Current antihyperlipidemic treatment includes statins. The current treatment provides moderate improvement of lipids. There are no compliance problems.  Risk factors for coronary artery disease include dyslipidemia, diabetes mellitus, hypertension, male  sex and a sedentary lifestyle.  Hypertension This is a chronic problem. The current episode started more than 1 year ago. The problem has been resolved since onset. The problem is controlled. Pertinent negatives include no chest pain, headaches, neck pain, palpitations or shortness of breath. Agents associated with hypertension include thyroid hormones. Risk factors for coronary artery disease include diabetes mellitus, dyslipidemia, sedentary lifestyle and male gender. Past treatments include calcium channel blockers and direct vasodilators. The current treatment provides mild improvement. There are no compliance problems.  Hypertensive end-organ damage includes kidney disease, CAD/MI, CVA and PVD. Identifiable causes of hypertension include chronic renal disease and a thyroid problem.  Thyroid Problem Presents for follow-up visit. Symptoms include cold intolerance. Patient reports no anxiety, constipation, depressed mood, diarrhea, fatigue, heat intolerance, leg swelling, palpitations, tremors, weight gain or weight loss. The symptoms have been stable. Past treatments include levothyroxine. His past medical history is significant for diabetes and hyperlipidemia.   Review of systems  Constitutional: + Minimally fluctuating body weight,  current Body mass index is 25.3 kg/m., no fatigue, no subjective hyperthermia, + subjective hypothermia Eyes: no blurry vision, no xerophthalmia ENT: no sore throat, no nodules palpated in throat, no dysphagia/odynophagia, no hoarseness Cardiovascular: no chest pain, no shortness of breath, no palpitations, no leg swelling Respiratory: no cough, no shortness of breath Gastrointestinal: no nausea/vomiting/diarrhea Musculoskeletal: no muscle/joint aches, essentially w/c bound due to deconditioning Skin: no rashes, no hyperemia Neurological: no tremors, no numbness, no tingling, no dizziness Psychiatric: no depression, no anxiety  Objective:    BP (!) 152/70    Pulse 63   Ht 5\' 8"  (1.727 m)   BMI 25.30 kg/m   Wt Readings from Last 3 Encounters:  10/02/21 166 lb 6.4 oz (75.5 kg)  09/25/21 166 lb 6.4 oz (75.5 kg)  08/22/21 185 lb (83.9 kg)    BP Readings from Last 3 Encounters:  10/06/21 (!) 152/70  10/02/21 (!) 167/68  09/25/21 (!) 153/69     Physical Exam- Limited  Constitutional:  Body mass index is 25.3 kg/m. , not in acute distress, normal state of mind Eyes:  EOMI, no exophthalmos Neck: Supple Cardiovascular: RRR, no murmurs, rubs, or gallops, no edema Respiratory: Adequate breathing efforts, no crackles, rales, rhonchi, or wheezing Musculoskeletal: no gross deformities, essentially WC bound due to deconditioning Skin:  no rashes, no hyperemia Neurological: no tremor with outstretched hands     CMP     Component Value Date/Time   NA 139 05/13/2021 1330   NA 143 03/27/2021 0958   K 4.5 05/13/2021 1330   CL 108 05/13/2021 1330   CO2 24 05/13/2021 1330   GLUCOSE 123 (H) 05/13/2021 1330   BUN 37 (H) 05/13/2021 1330   BUN 46 (H) 03/27/2021 0958   CREATININE 3.25 (H) 05/13/2021 1330   CREATININE 3.15 (H) 10/01/2020 1136   CALCIUM 9.3 05/13/2021 1330   PROT 7.0 05/13/2021 1330   PROT 6.8 03/27/2021 0958   ALBUMIN 3.4 (L) 05/13/2021 1330   ALBUMIN 4.0 03/27/2021 0958   AST 21 05/13/2021 1330   ALT 13 05/13/2021 1330   ALKPHOS 64 05/13/2021 1330   BILITOT 0.6 05/13/2021 1330   BILITOT 0.3 03/27/2021 0958   GFRNONAA 18 (L) 05/13/2021 1330   GFRNONAA 16 (L) 07/05/2020 0950   GFRAA 19 (L) 07/05/2020 0950   Diabetic Labs (most recent): Lab Results  Component Value Date   HGBA1C 5.7 10/06/2021   HGBA1C 5.9 04/03/2021   HGBA1C 6.3 (A) 10/03/2020    Lipid Panel     Component Value Date/Time   CHOL 169 03/27/2021 0958   TRIG 32 03/27/2021 0958   HDL 81 03/27/2021 0958   CHOLHDL 2.1 03/27/2021 0958   CHOLHDL 2.1 10/01/2020 1136   VLDL 8 03/12/2016 1139   LDLCALC 80 03/27/2021 0958   LDLCALC 75 10/01/2020 1136      Assessment & Plan:   1)  Controlled Type 2 diabetes mellitus with stage 4 chronic kidney disease, with long-term current use of insulin (HCC)  -his diabetes is  complicated by chronic kidney disease stage IV and patient remains at a high risk for more acute and chronic complications of diabetes which include CAD, CVA, CKD, retinopathy, and neuropathy. These are all discussed in detail with the patient.  -His wife is assisting on 100% of his care.  He presents today, accompanied by his wife (primary caregiver) and son, with logs showing at goal fasting and significantly above target postprandial glycemic profile.  His  POCT A1c today is 5.7%, essentially unchanged from previous visit.  His wife reported she rarely has to give him his morning dose of insulin (has only been giving it to him if glucose was above 120 or so).  This is likely why his evening numbers are so high.  There is no significant hypoglycemia reported.  - Nutritional counseling repeated at each appointment due to patients tendency to fall back in to old habits.  - The patient admits there is a room for improvement in their diet and drink choices. -  Suggestion is made for the patient to avoid simple carbohydrates from their diet including Cakes, Sweet Desserts / Pastries, Ice Cream, Soda (diet and regular), Sweet Tea, Candies, Chips, Cookies, Sweet Pastries, Store Bought Juices, Alcohol in Excess of 1-2 drinks a day, Artificial Sweeteners, Coffee Creamer, and "Sugar-free" Products. This will help patient to have stable blood glucose profile and potentially avoid unintended weight gain.   - I encouraged the patient to switch to unprocessed or minimally processed complex starch and increased protein intake (animal or plant source), fruits, and vegetables.   - Patient is advised to stick to a routine mealtimes to eat 3 meals a day and avoid unnecessary snacks (to snack only to correct hypoglycemia).  - I have approached patient  with the following individualized plan to manage diabetes and patient agrees.  -Advised patient and wife to continue on current regimen of Novolin 70/30 10 units twice a day with breakfast and supper when premeal blood glucose is greater than 90 and he is eating.    -He is encouraged to continue monitoring blood glucose 3 times daily, before injecting insulin at breakfast and supper, and before bed.  She is advised to call the clinic if she has readings less than 80 or greater than 300 for 3 tests in a row.   - Patient is warned not to take insulin without proper monitoring per orders.  -Patient is not a candidate for metformin,SGLT2 inhibitors due to CKD.  2) BP/HTN:  His blood pressure is controlled to target for his age.  He is advised to continue Amlodipine 10 mg po daily.  3) Lipids/HPL:  His recent lipid panel from 03/27/21 shows controlled LDL of 80  He is advised to continue Pravastatin 20 mg po daily before bed.  Side effects and precautions discussed with him.    4) Hypothyroidism:   -His recent thyroid function tests are consistent with appropriate hormone replacement.  He is advised to continue Levothyroxine 112 mcg po daily before breakfast.    - We discussed about the correct intake of his thyroid hormone, on empty stomach at fasting, with water, separated by at least 30 minutes from breakfast and other medications,  and separated by more than 4 hours from calcium, iron, multivitamins, acid reflux medications (PPIs). -Patient is made aware of the fact that thyroid hormone replacement is needed for life, dose to be adjusted by periodic monitoring of thyroid function tests.   - I advised patient to maintain close follow up with [name], MD for primary care needs.      I spent 30 minutes in the care of the patient today including review of labs from [place], Lipids, Thyroid Function, Hematology (current and previous including abstractions from other facilities);  face-to-face time discussing  his blood glucose readings/logs, discussing hypoglycemia and hyperglycemia episodes and symptoms, medications doses, his options of short and long term treatment based on the latest standards of care / guidelines;  discussion about incorporating lifestyle medicine;  and documenting the encounter.    Please refer to Patient Instructions for Blood Glucose Monitoring and Insulin/Medications Dosing Guide"  in media tab for additional information. Please  also refer to " Patient Self Inventory" in the Media  tab for reviewed elements of pertinent patient history.  [name] participated in the discussions, expressed understanding, and voiced agreement with the above plans.  All questions were answered to his satisfaction. he is encouraged to contact clinic should he have any questions or concerns prior to his return visit.    Follow up plan: -Return in about 6 months (around 04/05/2022) for Diabetes F/U with A1c in office, Thyroid follow up, Previsit labs, Bring meter and logs.    Rayetta Pigg, [facility] [address] [place], [place] [address] Phone: [phone] Fax: [phone]  10/06/2021, 1:31 PM

## 2021-10-08 DIAGNOSIS — Z0279 Encounter for issue of other medical certificate: Secondary | ICD-10-CM

## 2021-10-08 NOTE — Telephone Encounter (Signed)
Completed and called the pt

## 2021-10-09 ENCOUNTER — Encounter (HOSPITAL_COMMUNITY): Payer: Self-pay

## 2021-10-09 ENCOUNTER — Encounter (HOSPITAL_COMMUNITY)
Admission: RE | Admit: 2021-10-09 | Discharge: 2021-10-09 | Disposition: A | Discharge status: Home / Self Care | Payer: Medicare Other | Source: Ambulatory Visit | Attending: Nephrology | Admitting: Nephrology

## 2021-10-09 ENCOUNTER — Other Ambulatory Visit: Payer: Self-pay

## 2021-10-09 DIAGNOSIS — D509 Iron deficiency anemia, unspecified: Secondary | ICD-10-CM | POA: Insufficient documentation

## 2021-10-09 MED ORDER — SODIUM CHLORIDE 0.9 % IV SOLN: Freq: Once | INTRAVENOUS | Status: AC

## 2021-10-09 MED ORDER — SODIUM CHLORIDE 0.9 % IV SOLN
510.0000 mg | Freq: Once | INTRAVENOUS | Status: AC
  Administered 2021-10-09: 510 mg via INTRAVENOUS
  Filled 2021-10-09: qty 510

## 2021-10-10 DIAGNOSIS — N184 Chronic kidney disease, stage 4 (severe): Secondary | ICD-10-CM | POA: Diagnosis not present

## 2021-10-10 DIAGNOSIS — D631 Anemia in chronic kidney disease: Secondary | ICD-10-CM | POA: Diagnosis not present

## 2021-10-14 ENCOUNTER — Ambulatory Visit (INDEPENDENT_AMBULATORY_CARE_PROVIDER_SITE_OTHER): Payer: Medicare Other | Admitting: Pharmacist

## 2021-10-14 DIAGNOSIS — E1143 Type 2 diabetes mellitus with diabetic autonomic (poly)neuropathy: Secondary | ICD-10-CM

## 2021-10-14 DIAGNOSIS — E782 Mixed hyperlipidemia: Secondary | ICD-10-CM

## 2021-10-14 DIAGNOSIS — I10 Essential (primary) hypertension: Secondary | ICD-10-CM

## 2021-10-14 NOTE — Patient Instructions (Signed)
Chase [name],  It was great to talk to you today!  Please call me with any questions or concerns.   Visit Information   PATIENT GOALS/PLAN OF CARE:  Care Plan : Medication Management  Updates made by Beryle Lathe, [place] since 10/14/2021 12:00 AM     Problem: T2DM, HTN, HLD   Priority: High  Onset Date: 10/14/2021     Long-Range Goal: Disease Progression Prevention   Start Date: 10/14/2021  Expected End Date: 01/12/2022  This Visit's Progress: On track  Priority: High  Note:   Current Barriers:  Unable to independently monitor therapeutic efficacy Unable to achieve control of hypertension and hyperlipidemia  Pharmacist Clinical Goal(s):  Patient will Achieve adherence to monitoring guidelines and medication adherence to achieve therapeutic efficacy Achieve control of hypertension and hyperlipidemia as evidenced by improved blood pressure control and improved LDL through collaboration with PharmD and provider.   Interventions: 1:1 collaboration with [name], MD regarding development and update of comprehensive plan of care as evidenced by provider attestation and co-signature Inter-disciplinary care team collaboration (see longitudinal plan of care) Comprehensive medication review performed; medication list updated in electronic medical record  Type 2 Diabetes - New goal.: Followed by Dr. Pincus Sanes Controlled; Most recent A1c at goal of <7% per ADA guidelines Current medications: Humulin 70/30 10 units subcutaneously before breakfast and dinner Intolerances: none Taking medications as directed: no, patient only using 10 units if blood glucose >150 Side effects thought to be attributed to current medication regimen: no Denies hypoglycemic symptoms (sweaty and shaky). Denies hyperglycemic symptoms (excessive hunger. Does report frequent urination and frequent hydration. Hypoglycemia prevention: not indicated at this time Current meal patterns:  breakfast: eggs, sausage, waffles, and oatmeal or sometimes sausage biscuit; lunch: sandwich and cookie; dinner: fish, baked/grilled chicken, pork, and vegetables; snacks: occasional snacks; beverages: water Current exercise: not active On a statin: yes On aspirin 81 mg daily: no, on Plavix Last microalbumin/creatinine ratio: 269.9; on an ACEi/ARB: no, but should consider. Patient sees Dr. Theador Hawthorne (nephrology). Last eye exam: completed within last year Last foot exam: overdue Pneumonia vaccine: series complete Influenza vaccine: overdue, needs this fall Current glucose readings:  fasting: 100-140; before dinner: 200-250 Continue insulin per endocrinology Instructed to monitor blood sugars twice a day at the following times: fasting (at least 8 hours since last food consumption), 5-15 minutes before dinner, and whenever patient experiences symptoms of hypo/hyperglycemia  May be reasonable to consider low dose once daily long acting basal insulin such as [place] or [place] instead of Humulin 70/30 as needed. This may reduce wide variations inf blood glucose that the patient is experiencing.  May also consider GLP-1 agonist especially due to history of ASCVD. Of note, patient does have a history of gallstones.  Discussed CGM with patient but patient and family declines at this time but will do some research and consider it.  Hypertension - New goal.: Blood pressure under poor control. Blood pressure is above goal of <130/80 mmHg per 2017 AHA/ACC guidelines. Current medications: amlodipine 10 mg by mouth once daily and hydralazine 25 mg by mouth twice daily Patient reports hydralazine recently added by nephrology Intolerances: none Taking medications as directed: yes Side effects thought to be attributed to current medication regimen: no Current home blood pressure: does not check and does not have a blood pressure machine Continue amlodipine 10 mg by mouth once daily and hydralazine 25 mg by  mouth twice daily Consider further titration of hydralazine if blood pressure remains  elevated above goal Encourage dietary sodium restriction/DASH diet Recommend home blood pressure monitoring to discuss at next visit Reviewed risks of hypertension, principles of treatment and consequences of untreated hypertension  Hyperlipidemia - New goal.: Uncontrolled. LDL above goal of <70 due to very high risk given established clinical ASCVD per 2020 AACE/ACE guidelines. Triglycerides at goal of <150 per 2020 AACE/ACE guidelines. Current medications: pravastatin 20 mg by mouth once daily Antiplatelet: clopidogrel 75 mg by mouth daily Intolerances: none Taking medications as directed: yes Side effects thought to be attributed to current medication regimen: no Continue pravastatin 20 mg by mouth once daily Consider addition of ezetimibe 10 mg by mouth daily for secondary prevention Encourage dietary reduction of high fat containing foods such as butter, nuts, bacon, egg yolks, etc. Reviewed risks of hyperlipidemia, principles of treatment and consequences of untreated hyperlipidemia Re-check lipid panel in 4-12 weeks  Patient Goals/Self-Care Activities Patient will:  Take medications as prescribed Check blood sugar twice a day at the following times: fasting (at least 8 hours since last food consumption), 5-15 minutes before dinner, and whenever patient experiences symptoms of hypo/hyperglycemia, document, and provide at future appointments Check blood pressure at least once daily, document, and provide at future appointments Engage in dietary modifications by less frequent dining out, decreased fat intake, and fewer sweetened foods & beverages  Follow Up Plan: Telephone follow up appointment with care management team member scheduled for: 10/23/21      Consent to CCM Services: Chase [name] was given information about Chronic Care Management services including:  CCM service includes personalized  support from designated clinical staff supervised by his physician, including individualized plan of care and coordination with other care providers 24/7 contact phone numbers for assistance for urgent and routine care needs. Service will only be billed when office clinical staff spend 20 minutes or more in a month to coordinate care. Only one practitioner may furnish and bill the service in a calendar month. The patient may stop CCM services at any time (effective at the end of the month) by phone call to the office staff. The patient will be responsible for cost sharing (co-pay) of up to 20% of the service fee (after annual deductible is met).  Patient agreed to services and verbal consent obtained.   The patient verbalized understanding of instructions, educational materials, and care plan provided today and declined offer to receive copy of patient instructions, educational materials, and care plan.   Telephone follow up appointment with care management team member scheduled for:10/23/21  Kennon Holter, PharmD Clinical Pharmacist [facility] [phone]

## 2021-10-14 NOTE — Chronic Care Management (AMB) (Signed)
Chronic Care Management Pharmacy Note  10/14/2021 Name:  Chase [name] MRN:  [id number] DOB:  [birth year]  Summary:  Type 2 Diabetes Patient reports only using Humulin 70/30 dose of 10 units if blood glucose >150. May be reasonable to consider low dose once daily long acting basal insulin such as [place] or [place] instead of Humulin 70/30 as needed. This may reduce wide variations inf blood glucose that the patient is experiencing. Will message endocrinology.  May also consider GLP-1 agonist especially due to history of ASCVD. Of note, patient does have a history of gallstones.  Patient has diabetes and microalbuminuria. ACEi/ARB indicated but is currently not taking for unknown reasons, but should consider. Patient sees Dr. Theador [name] (nephrology). Discussed CGM with patient but patient and family declines at this time but will do some research and consider it.  Hypertension Consider further titration of hydralazine if blood pressure remains elevated above goal Recommended that the patient get a blood pressure machine to monitor blood pressure at home more closely  Hyperlipidemia Continue pravastatin 20 mg by mouth once daily Consider addition of ezetimibe 10 mg by mouth daily for secondary prevention Encourage dietary reduction of high fat containing foods such as butter, nuts, bacon, egg yolks, etc.  Subjective: Chase [name] is an 87 y.o. year old male who is a primary patient of Chase Helper, MD.  The CCM team was consulted for assistance with disease management and care coordination needs.    Engaged with patient and family (spouse and daughters) on speaker by telephone for initial visit in response to provider referral for pharmacy case management and/or care coordination services.   Consent to Services:  The patient was given the following information about Chronic Care Management services today, agreed to services, and gave verbal consent: 1. CCM service includes  personalized support from designated clinical staff supervised by the primary care provider, including individualized plan of care and coordination with other care providers 2. 24/7 contact phone numbers for assistance for urgent and routine care needs. 3. Service will only be billed when office clinical staff spend 20 minutes or more in a month to coordinate care. 4. Only one practitioner may furnish and bill the service in a calendar month. 5.The patient may stop CCM services at any time (effective at the end of the month) by phone call to the office staff. 6. The patient will be responsible for cost sharing (co-pay) of up to 20% of the service fee (after annual deductible is met). Patient agreed to services and consent obtained.  Patient Care Team: Chase Helper, MD as PCP - General Fran Lowes, MD (Inactive) as Consulting Physician (Nephrology) Phillips Odor, MD as Consulting Physician (Neurology) Cassandria Anger, MD as Consulting Physician (Endocrinology) Georganna Skeans, MD as Consulting Physician (General Surgery) Beryle Lathe, [facility] (Pharmacist)  Objective:  Lab Results  Component Value Date   CREATININE 3.25 (H) 05/13/2021   CREATININE 3.67 (H) 03/27/2021   CREATININE 3.15 (H) 10/01/2020    Lab Results  Component Value Date   HGBA1C 5.7 10/06/2021   Last diabetic Eye exam:  Lab Results  Component Value Date/Time   HMDIABEYEEXA Retinopathy (A) 03/26/2014 12:00 AM    Last diabetic Foot exam:  Lab Results  Component Value Date/Time   HMDIABFOOTEX yes 11/26/2010 12:00 AM        Component Value Date/Time   CHOL 169 03/27/2021 0958   TRIG 32 03/27/2021 0958   HDL 81 03/27/2021 0958   CHOLHDL 2.1  03/27/2021 0958   CHOLHDL 2.1 10/01/2020 1136   VLDL 8 03/12/2016 1139   LDLCALC 80 03/27/2021 0958   LDLCALC 75 10/01/2020 1136    Hepatic Function Latest Ref Rng & Units 05/13/2021 03/27/2021 10/01/2020  Total Protein 6.5 - 8.1 g/dL 7.0 6.8 6.6   Albumin 3.5 - 5.0 g/dL 3.4(L) 4.0 -  AST 15 - 41 U/L '21 16 18  ' ALT 0 - 44 U/L '13 9 11  ' Alk Phosphatase 38 - 126 U/L 64 82 -  Total Bilirubin 0.3 - 1.2 mg/dL 0.6 0.3 0.4  Bilirubin, Direct 0.0 - 0.2 mg/dL - - -    Lab Results  Component Value Date/Time   TSH 5.670 (H) 09/29/2021 04:40 PM   TSH 3.030 03/27/2021 09:58 AM   FREET4 1.25 09/29/2021 04:40 PM   FREET4 1.22 04/02/2021 11:52 AM    CBC Latest Ref Rng & Units 05/13/2021 03/27/2021 07/05/2020  WBC 4.0 - 10.5 K/uL 6.2 6.0 6.2  Hemoglobin 13.0 - 17.0 g/dL 9.3(L) 9.1(L) 9.6(L)  Hematocrit 39.0 - 52.0 % 30.1(L) 27.3(L) 29.3(L)  Platelets 150 - 400 K/uL 252 - 224    Lab Results  Component Value Date/Time   VD25OH 77 07/05/2020 09:50 AM   VD25OH 57 07/03/2019 03:09 PM    Clinical ASCVD: Yes  The ASCVD Risk score (Arnett DK, et al., 2019) failed to calculate for the following reasons:   The 2019 ASCVD risk score is only valid for ages 36 to 66   The patient has a prior MI or stroke diagnosis    Social History   Tobacco Use  Smoking Status Never  Smokeless Tobacco Never   BP Readings from Last 3 Encounters:  10/09/21 (!) 144/65  10/06/21 (!) 152/70  10/02/21 (!) 167/68   Pulse Readings from Last 3 Encounters:  10/09/21 64  10/06/21 63  10/02/21 70   Wt Readings from Last 3 Encounters:  10/02/21 166 lb 6.4 oz (75.5 kg)  09/25/21 166 lb 6.4 oz (75.5 kg)  08/22/21 185 lb (83.9 kg)    Assessment: Review of patient past medical history, allergies, medications, health status, including review of consultants reports, laboratory and other test data, was performed as part of comprehensive evaluation and provision of chronic care management services.   SDOH:  (Social Determinants of Health) assessments and interventions performed:    CCM Care Plan  Allergies  Allergen Reactions   Bayer Advanced Aspirin [Aspirin] Nausea And Vomiting   Penicillins Nausea And Vomiting    Has patient had a PCN reaction causing immediate  rash, facial/tongue/throat swelling, SOB or lightheadedness with hypotension: unknown Has patient had a PCN reaction causing severe rash involving mucus membranes or skin necrosis: unknown Has patient had a PCN reaction that required hospitalization: unknown Has patient had a PCN reaction occurring within the last 10 years: no If all of the above answers are "NO", then may proceed with Cephalosporin use.    Medications Reviewed Today     Reviewed by Beryle Lathe, [facility] (Pharmacist) on 10/14/21 at 1521  Med List Status: <None>   Medication Order Taking? Sig Documenting Provider Last Dose Status Informant  amLODipine (NORVASC) 10 MG tablet [id number] Yes TAKE 1 TABLET DAILY Chase Helper, MD Taking Active   calcium carbonate (TUMS - DOSED IN MG ELEMENTAL CALCIUM) 500 MG chewable tablet [id number] Yes Chew 1 tablet by mouth 2 (two) times daily. For Bone Health [provider] Taking Active Spouse/Significant Other  clopidogrel (PLAVIX) 75 MG tablet [id number] Yes TAKE  1 TABLET DAILY Chase Helper, MD Taking Active   diphenoxylate-atropine (LOMOTIL) 2.5-0.025 MG tablet [id number] Yes Take one tablet by mouth once daily, as needed, for watery, loose stool Chase Helper, MD Taking Active   glucose blood [facility] ULTRA) test strip [id number]  USE 1 STRIP THREE TIMES A DAY AS INSTRUCTED. Brita Romp, NP  Active   HUMULIN 70/30 KWIKPEN (70-30) 100 UNIT/ML KwikPen [id number] Yes INJECT 10 UNITS UNDER THE SKIN TWICE A DAY Nida, Marella Chimes, MD Taking Active            Med Note Beryle Lathe   Tue Oct 14, 2021  3:13 PM) Patient only using if blood glucose >150  hydrALAZINE (APRESOLINE) 25 MG tablet [id number] Yes Take 25 mg by mouth in the morning and at bedtime. [provider] Taking Active   Insulin Pen Needle (B-D ULTRAFINE III SHORT PEN) 31G X 8 MM MISC [id number]  1 each by Does not apply route as directed. Cassandria Anger, MD  Active    levETIRAcetam (KEPPRA) 500 MG tablet [id number] Yes Take 250 mg by mouth at bedtime.  [provider] Taking Active Spouse/Significant Other  levothyroxine (SYNTHROID) 112 MCG tablet [id number] Yes TAKE 1 TABLET DAILY BEFORE BREAKFAST Brita Romp, NP Taking Active   Multiple Vitamins-Minerals (MULTIVITAMIN WITH MINERALS) tablet [id number] Yes Take 1 tablet by mouth daily. [provider] Taking Active Spouse/Significant Other  olopatadine (PATANOL) 0.1 % ophthalmic solution [id number] Yes INSTILL 1 DROP INTO EACH EYE TWICE DAILY Chase Helper, MD Taking Active   OneTouch Delica Lancets 43X MISC [id number]  1 each by Other route 2 (two) times daily. Cassandria Anger, MD  Active   polyethylene glycol powder [facility]) 17 GM/SCOOP powder [id number] Yes Take 17 g by mouth daily.  Patient taking differently: Take 17 g by mouth daily as needed.   Chase Helper, MD Taking Active   pravastatin (PRAVACHOL) 20 MG tablet [id number] Yes TAKE 1 TABLET DAILY Chase Helper, MD Taking Active   sodium bicarbonate 650 MG tablet [id number] Yes Take 650 mg by mouth 3 (three) times daily. [provider] Taking Active   tamsulosin (FLOMAX) 0.4 MG CAPS capsule [id number] Yes TAKE 1 CAPSULE DAILY Dahlstedt, Annie Main, MD Taking Active   Vitamin D, Ergocalciferol, (DRISDOL) 1.25 MG (50000 UNIT) CAPS capsule [id number]  Take 50,000 Units by mouth once a week. [provider]  Active   zinc gluconate 50 MG tablet [id number]  Take by mouth. [provider]  Active             Patient Active Problem List   Diagnosis Date Noted   COVID-19 08/08/2021   Epilepsy ([place]) 11/07/2020   Polyneuropathy due to type 2 diabetes mellitus ([place]) 11/07/2020   Atherosclerosis of native artery of left lower extremity with ulceration of ankle ([place]) 08/23/2020   Loose stools 10/28/2019   Incontinence 07/08/2019   Osteoarthritis of left knee 04/15/2019   Diarrhea  04/15/2019   Common bile duct stone 04/18/2018   Cholecystocutaneous fistula    Physical deconditioning 02/28/2018   Choledocholithiasis 02/25/2018   Hyperkalemia 41/96/2229   Metabolic bone disease 79/89/2119   Fistula of bile duct 02/14/2018   Dermatitis 02/25/2017   Essential hypertension, benign 12/13/2015   RBBB (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 fascicular block)    Hypertensive heart disease 12/01/2014   Chronic venous insufficiency 11/16/2014   Carotid artery disease ([place]) 05/18/2014   Poor balance 09/11/2013   Difficulty walking  09/11/2013   Hx of falling 09/11/2013   History of stroke 09/07/2012   CKD (chronic kidney disease) stage 4, GFR 15-29 ml/min (HCC) 09/07/2012   Anemia of chronic disease 09/07/2012   Cholelithiasis 08/10/2012   Peripheral autonomic neuropathy due to diabetes mellitus ([place]) 03/14/2012   Seizure disorder, complex partial ([place]) 03/07/2012   Type 1 diabetes mellitus with vascular disease ([place])    Type 1 diabetes mellitus with diabetic nephropathy ([place]) 08/21/2008   Hypothyroidism 03/09/2008   Mixed hyperlipidemia 03/09/2008   Renovascular hypertension 03/09/2008   Peripheral vascular disease ([place]) 03/09/2008   DEGENERATIVE JOINT DISEASE, KNEE 01/26/2008   Osteoarthritis of spine 01/26/2008   Spinal stenosis     Immunization History  Administered Date(s) Administered   Fluad Quad(high Dose 65+) 08/07/2019, 10/07/2020   H1N1 11/14/2008   Influenza Split 10/07/2011, 09/08/2012   Influenza Whole 08/22/2007, 08/27/2010   Influenza,inj,Quad PF,6+ Mos 08/22/2013, 09/26/2014, 09/26/2015, 09/01/2016, 09/20/2017, 09/26/2018   Moderna Sars-Covid-2 Vaccination 01/05/2021   Pneumococcal Conjugate-13 06/12/2014   Pneumococcal Polysaccharide-23 05/21/2004   Td 05/21/2004   Tdap 10/07/2011    Conditions to be addressed/monitored: HTN, HLD, and DMII  Care Plan : Medication Management  Updates made by Beryle Lathe, [place] since  10/14/2021 12:00 AM     Problem: T2DM, HTN, HLD   Priority: High  Onset Date: 10/14/2021     Long-Range Goal: Disease Progression Prevention   Start Date: 10/14/2021  Expected End Date: 01/12/2022  This Visit's Progress: On track  Priority: High  Note:   Current Barriers:  Unable to independently monitor therapeutic efficacy Unable to achieve control of hypertension and hyperlipidemia  Pharmacist Clinical Goal(s):  Patient will Achieve adherence to monitoring guidelines and medication adherence to achieve therapeutic efficacy Achieve control of hypertension and hyperlipidemia as evidenced by improved blood pressure control and improved LDL through collaboration with PharmD and provider.   Interventions: 1:1 collaboration with Chase Helper, MD regarding development and update of comprehensive plan of care as evidenced by provider attestation and co-signature Inter-disciplinary care team collaboration (see longitudinal plan of care) Comprehensive medication review performed; medication list updated in electronic medical record  Type 2 Diabetes - New goal.: Followed by Dr. Pincus Sanes Controlled; Most recent A1c at goal of <7% per ADA guidelines Current medications: Humulin 70/30 10 units subcutaneously before breakfast and dinner Intolerances: none Taking medications as directed: no, patient only using 10 units if blood glucose >150 Side effects thought to be attributed to current medication regimen: no Denies hypoglycemic symptoms (sweaty and shaky). Denies hyperglycemic symptoms (excessive hunger. Does report frequent urination and frequent hydration. Hypoglycemia prevention: not indicated at this time Current meal patterns: breakfast: eggs, sausage, waffles, and oatmeal or sometimes sausage biscuit; lunch: sandwich and cookie; dinner: fish, baked/grilled chicken, pork, and vegetables; snacks: occasional snacks; beverages: water Current exercise: not active On a statin:  yes On aspirin 81 mg daily: no, on Plavix Last microalbumin/creatinine ratio: 269.9; on an ACEi/ARB: no, but should consider. Patient sees Dr. Theador [name] (nephrology). Last eye exam: completed within last year Last foot exam: overdue Pneumonia vaccine: series complete Influenza vaccine: overdue, needs this fall Current glucose readings:  fasting: 100-140; before dinner: 200-250 Continue insulin per endocrinology Instructed to monitor blood sugars twice a day at the following times: fasting (at least 8 hours since last food consumption), 5-15 minutes before dinner, and whenever patient experiences symptoms of hypo/hyperglycemia  May be reasonable to consider low dose once daily long acting basal insulin such as [place] or [employer]  instead of Humulin 70/30 as needed. This may reduce wide variations inf blood glucose that the patient is experiencing.  May also consider GLP-1 agonist especially due to history of ASCVD. Of note, patient does have a history of gallstones.  Discussed CGM with patient but patient and family declines at this time but will do some research and consider it.  Hypertension - New goal.: Blood pressure under poor control. Blood pressure is above goal of <130/80 mmHg per 2017 AHA/ACC guidelines. Current medications: amlodipine 10 mg by mouth once daily and hydralazine 25 mg by mouth twice daily Patient reports hydralazine recently added by nephrology Intolerances: none Taking medications as directed: yes Side effects thought to be attributed to current medication regimen: no Current home blood pressure: does not check and does not have a blood pressure machine Continue amlodipine 10 mg by mouth once daily and hydralazine 25 mg by mouth twice daily Consider further titration of hydralazine if blood pressure remains elevated above goal Encourage dietary sodium restriction/DASH diet Recommend home blood pressure monitoring to discuss at next visit Reviewed risks of hypertension,  principles of treatment and consequences of untreated hypertension  Hyperlipidemia - New goal.: Uncontrolled. LDL above goal of <70 due to very high risk given established clinical ASCVD per 2020 AACE/ACE guidelines. Triglycerides at goal of <150 per 2020 AACE/ACE guidelines. Current medications: pravastatin 20 mg by mouth once daily Antiplatelet: clopidogrel 75 mg by mouth daily Intolerances: none Taking medications as directed: yes Side effects thought to be attributed to current medication regimen: no Continue pravastatin 20 mg by mouth once daily Consider addition of ezetimibe 10 mg by mouth daily for secondary prevention Encourage dietary reduction of high fat containing foods such as butter, nuts, bacon, egg yolks, etc. Reviewed risks of hyperlipidemia, principles of treatment and consequences of untreated hyperlipidemia Re-check lipid panel in 4-12 weeks  Patient Goals/Self-Care Activities Patient will:  Take medications as prescribed Check blood sugar twice a day at the following times: fasting (at least 8 hours since last food consumption), 5-15 minutes before dinner, and whenever patient experiences symptoms of hypo/hyperglycemia, document, and provide at future appointments Check blood pressure at least once daily, document, and provide at future appointments Engage in dietary modifications by less frequent dining out, decreased fat intake, and fewer sweetened foods & beverages  Follow Up Plan: Telephone follow up appointment with care management team member scheduled for: 10/23/21      Medication Assistance: None required.  Patient affirms current coverage meets needs.  Patient's preferred pharmacy is:  [place], [place] [address] [place] [address] Phone: [phone] Fax: [phone]  [id number] [place], [place] [address] [address]Phone: [phone]: [phone]  [id number] [place], [place]: [phone]: [phone] Up:  Patient agrees to Care Plan and Follow-up.  Plan: Telephone follow up appointment with care management team member scheduled for:  10/23/21  ChKennon HolterPharmD Clinical Pharmacist [facility] [phone]

## 2021-10-23 ENCOUNTER — Telehealth: Payer: Medicare Other

## 2021-10-24 DIAGNOSIS — N184 Chronic kidney disease, stage 4 (severe): Secondary | ICD-10-CM | POA: Diagnosis not present

## 2021-10-24 DIAGNOSIS — D631 Anemia in chronic kidney disease: Secondary | ICD-10-CM | POA: Diagnosis not present

## 2021-11-05 DIAGNOSIS — E785 Hyperlipidemia, unspecified: Secondary | ICD-10-CM

## 2021-11-05 DIAGNOSIS — Z794 Long term (current) use of insulin: Secondary | ICD-10-CM | POA: Diagnosis not present

## 2021-11-05 DIAGNOSIS — E1159 Type 2 diabetes mellitus with other circulatory complications: Secondary | ICD-10-CM

## 2021-11-05 DIAGNOSIS — I10 Essential (primary) hypertension: Secondary | ICD-10-CM

## 2021-11-07 DIAGNOSIS — R718 Other abnormality of red blood cells: Secondary | ICD-10-CM | POA: Diagnosis not present

## 2021-11-07 DIAGNOSIS — D631 Anemia in chronic kidney disease: Secondary | ICD-10-CM | POA: Diagnosis not present

## 2021-11-07 DIAGNOSIS — R809 Proteinuria, unspecified: Secondary | ICD-10-CM | POA: Diagnosis not present

## 2021-11-07 DIAGNOSIS — I5032 Chronic diastolic (congestive) heart failure: Secondary | ICD-10-CM | POA: Diagnosis not present

## 2021-11-07 DIAGNOSIS — N189 Chronic kidney disease, unspecified: Secondary | ICD-10-CM | POA: Diagnosis not present

## 2021-11-07 DIAGNOSIS — I129 Hypertensive chronic kidney disease with stage 1 through stage 4 chronic kidney disease, or unspecified chronic kidney disease: Secondary | ICD-10-CM | POA: Diagnosis not present

## 2021-11-07 DIAGNOSIS — N185 Chronic kidney disease, stage 5: Secondary | ICD-10-CM | POA: Diagnosis not present

## 2021-11-07 DIAGNOSIS — E1122 Type 2 diabetes mellitus with diabetic chronic kidney disease: Secondary | ICD-10-CM | POA: Diagnosis not present

## 2021-11-07 DIAGNOSIS — E1129 Type 2 diabetes mellitus with other diabetic kidney complication: Secondary | ICD-10-CM | POA: Diagnosis not present

## 2021-11-07 DIAGNOSIS — N184 Chronic kidney disease, stage 4 (severe): Secondary | ICD-10-CM | POA: Diagnosis not present

## 2021-11-11 ENCOUNTER — Ambulatory Visit (INDEPENDENT_AMBULATORY_CARE_PROVIDER_SITE_OTHER): Payer: Medicare Other | Admitting: Pharmacist

## 2021-11-11 DIAGNOSIS — E1121 Type 2 diabetes mellitus with diabetic nephropathy: Secondary | ICD-10-CM

## 2021-11-11 DIAGNOSIS — I10 Essential (primary) hypertension: Secondary | ICD-10-CM

## 2021-11-11 DIAGNOSIS — E782 Mixed hyperlipidemia: Secondary | ICD-10-CM

## 2021-11-11 NOTE — Patient Instructions (Signed)
Chase [name],  It was great to talk to you today!  Please call me with any questions or concerns.   Visit Information  Following are the goals we discussed today:  Patient Goals/Self-Care Activities Patient will:  Take medications as prescribed Check blood sugar twice a day at the following times: fasting (at least 8 hours since last food consumption), 5-15 minutes before dinner, and whenever patient experiences symptoms of hypo/hyperglycemia, document, and provide at future appointments Check blood pressure at least once daily, document, and provide at future appointments Engage in dietary modifications by less frequent dining out, decreased fat intake, and fewer sweetened foods & beverages  Plan: Telephone follow up appointment with care management team member scheduled for:  01/13/22  Kennon Holter, PharmD, BCACP, CPP Clinical Pharmacist Practitioner [facility] [phone]   Please call the care guide team at [phone] if you need to cancel or reschedule your appointment.   The patient verbalized understanding of instructions, educational materials, and care plan provided today and declined offer to receive copy of patient instructions, educational materials, and care plan.

## 2021-11-11 NOTE — Chronic Care Management (AMB) (Signed)
Chronic Care Management Pharmacy Note  11/11/2021 Name:  Chase [name] MRN:  [id number] DOB:  1931-10-02  Summary:  Type 2 Diabetes Followed by Dr. Pincus Sanes Current medications: Humulin 70/30 10 units subcutaneously before breakfast and dinner Taking medications as directed: no, patient only using 10 units if blood glucose >150 (which is most evenings per wife). Endocrinology has been notified.  Current glucose readings: fasting: 104-119 over past few days; blood glucose usually in the 200s before dinner Continue insulin per endocrinology May be reasonable to consider low dose once daily long acting basal insulin such as [place] or [place] instead of Humulin 70/30 as needed. This may reduce wide variations in blood glucose that the patient is experiencing. Recommendation sent to endocrinology.  Family continues to consider continuous glucose monitor but will let me know if they want to move forward.    Hypertension  Blood pressure under fair control. Blood pressure is above goal of <130/80 mmHg per 2017 AHA/ACC guidelines. Current medications: amlodipine 10 mg by mouth once daily and hydralazine 25 mg by mouth twice daily Patient reports hydralazine recently added by nephrology. Blood pressure 140/70 at last nephrology visit on 11/07/21 and nephrology reports blood pressure improved.  Consider further titration of hydralazine if blood pressure remains elevated above goal  Hyperlipidemia Uncontrolled. LDL above goal of <70 due to very high risk given established clinical ASCVD per 2020 AACE/ACE guidelines. Triglycerides at goal of <150 per 2020 AACE/ACE guidelines. Consider increasing dose of pravastatin or consider addition of ezetimibe 10 mg by mouth daily for secondary prevention  Subjective: Chase [name] is an 87 y.o. year old male who is a primary patient of [name], MD.  The CCM team was consulted for assistance with disease management and care coordination  needs.    Engaged with patient's caregiver (wife - Chase [name]) by telephone for follow up visit in response to provider referral for pharmacy case management and/or care coordination services.   Consent to Services:  The patient was given information about Chronic Care Management services, agreed to services, and gave verbal consent prior to initiation of services.  Please see initial visit note for detailed documentation.   Patient Care Team: [name], MD as PCP - General Fran Lowes, MD (Inactive) as Consulting Physician (Nephrology) Phillips Odor, MD as Consulting Physician (Neurology) Cassandria Anger, MD as Consulting Physician (Endocrinology) Georganna Skeans, MD as Consulting Physician (General Surgery) Beryle Lathe, [facility] (Pharmacist)  Objective:  Lab Results  Component Value Date   CREATININE 3.25 (H) 05/13/2021   CREATININE 3.67 (H) 03/27/2021   CREATININE 3.15 (H) 10/01/2020    Lab Results  Component Value Date   HGBA1C 5.7 10/06/2021   Last diabetic Eye exam:  Lab Results  Component Value Date/Time   HMDIABEYEEXA Retinopathy (A) 03/26/2014 12:00 AM    Last diabetic Foot exam:  Lab Results  Component Value Date/Time   HMDIABFOOTEX yes 11/26/2010 12:00 AM        Component Value Date/Time   CHOL 169 03/27/2021 0958   TRIG 32 03/27/2021 0958   HDL 81 03/27/2021 0958   CHOLHDL 2.1 03/27/2021 0958   CHOLHDL 2.1 10/01/2020 1136   VLDL 8 03/12/2016 1139   LDLCALC 80 03/27/2021 0958   LDLCALC 75 10/01/2020 1136    Hepatic Function Latest Ref Rng & Units 05/13/2021 03/27/2021 10/01/2020  Total Protein 6.5 - 8.1 g/dL 7.0 6.8 6.6  Albumin 3.5 - 5.0 g/dL 3.4(L) 4.0 -  AST 15 -  41 U/L '21 16 18  ' ALT 0 - 44 U/L '13 9 11  ' Alk Phosphatase 38 - 126 U/L 64 82 -  Total Bilirubin 0.3 - 1.2 mg/dL 0.6 0.3 0.4  Bilirubin, Direct 0.0 - 0.2 mg/dL - - -    Lab Results  Component Value Date/Time   TSH 5.670 (H) 09/29/2021 04:40 PM   TSH 3.030  03/27/2021 09:58 AM   FREET4 1.25 09/29/2021 04:40 PM   FREET4 1.22 04/02/2021 11:52 AM    CBC Latest Ref Rng & Units 05/13/2021 03/27/2021 07/05/2020  WBC 4.0 - 10.5 K/uL 6.2 6.0 6.2  Hemoglobin 13.0 - 17.0 g/dL 9.3(L) 9.1(L) 9.6(L)  Hematocrit 39.0 - 52.0 % 30.1(L) 27.3(L) 29.3(L)  Platelets 150 - 400 K/uL 252 - 224    Lab Results  Component Value Date/Time   VD25OH 77 07/05/2020 09:50 AM   VD25OH 57 07/03/2019 03:09 PM    Clinical ASCVD: Yes  The ASCVD Risk score (Arnett DK, et al., 2019) failed to calculate for the following reasons:   The 2019 ASCVD risk score is only valid for ages 25 to 63   The patient has a prior MI or stroke diagnosis    Social History   Tobacco Use  Smoking Status Never  Smokeless Tobacco Never   BP Readings from Last 3 Encounters:  10/09/21 (!) 144/65  10/06/21 (!) 152/70  10/02/21 (!) 167/68   Pulse Readings from Last 3 Encounters:  10/09/21 64  10/06/21 63  10/02/21 70   Wt Readings from Last 3 Encounters:  10/02/21 166 lb 6.4 oz (75.5 kg)  09/25/21 166 lb 6.4 oz (75.5 kg)  08/22/21 185 lb (83.9 kg)    Assessment: Review of patient past medical history, allergies, medications, health status, including review of consultants reports, laboratory and other test data, was performed as part of comprehensive evaluation and provision of chronic care management services.   SDOH:  (Social Determinants of Health) assessments and interventions performed:    CCM Care Plan  Allergies  Allergen Reactions   Bayer Advanced Aspirin [Aspirin] Nausea And Vomiting   Penicillins Nausea And Vomiting    Has patient had a PCN reaction causing immediate rash, facial/tongue/throat swelling, SOB or lightheadedness with hypotension: unknown Has patient had a PCN reaction causing severe rash involving mucus membranes or skin necrosis: unknown Has patient had a PCN reaction that required hospitalization: unknown Has patient had a PCN reaction occurring within  the last 10 years: no If all of the above answers are "NO", then may proceed with Cephalosporin use.    Medications Reviewed Today     Reviewed by Beryle Lathe, [facility] (Pharmacist) on 10/14/21 at 1521  Med List Status: <None>   Medication Order Taking? Sig Documenting Provider Last Dose Status Informant  amLODipine (NORVASC) 10 MG tablet [id number] Yes TAKE 1 TABLET DAILY [name], MD Taking Active   calcium carbonate (TUMS - DOSED IN MG ELEMENTAL CALCIUM) 500 MG chewable tablet [id number] Yes Chew 1 tablet by mouth 2 (two) times daily. For Bone Health [provider] Taking Active Spouse/Significant Other  clopidogrel (PLAVIX) 75 MG tablet [id number] Yes TAKE 1 TABLET DAILY [name], MD Taking Active   diphenoxylate-atropine (LOMOTIL) 2.5-0.025 MG tablet [id number] Yes Take one tablet by mouth once daily, as needed, for watery, loose stool [name], MD Taking Active   glucose blood [facility] ULTRA) test strip [id number]  USE 1 STRIP THREE TIMES A DAY AS INSTRUCTED. Brita Romp, NP  Active  HUMULIN 70/30 KWIKPEN (70-30) 100 UNIT/ML KwikPen [id number] Yes INJECT 10 UNITS UNDER THE SKIN TWICE A DAY Nida, Marella Chimes, MD Taking Active            Med Note Beryle Lathe   Tue Oct 14, 2021  3:13 PM) Patient only using if blood glucose >150  hydrALAZINE (APRESOLINE) 25 MG tablet [id number] Yes Take 25 mg by mouth in the morning and at bedtime. [provider] Taking Active   Insulin Pen Needle (B-D ULTRAFINE III SHORT PEN) 31G X 8 MM MISC [id number]  1 each by Does not apply route as directed. Cassandria Anger, MD  Active   levETIRAcetam (KEPPRA) 500 MG tablet [id number] Yes Take 250 mg by mouth at bedtime.  [provider] Taking Active Spouse/Significant Other  levothyroxine (SYNTHROID) 112 MCG tablet [id number] Yes TAKE 1 TABLET DAILY BEFORE BREAKFAST Brita Romp, NP Taking Active   Multiple Vitamins-Minerals  (MULTIVITAMIN WITH MINERALS) tablet [id number] Yes Take 1 tablet by mouth daily. [provider] Taking Active Spouse/Significant Other  olopatadine (PATANOL) 0.1 % ophthalmic solution [id number] Yes INSTILL 1 DROP INTO EACH EYE TWICE DAILY [name], MD Taking Active   OneTouch Delica Lancets 09F MISC [id number]  1 each by Other route 2 (two) times daily. Cassandria Anger, MD  Active   polyethylene glycol powder [facility]) 17 GM/SCOOP powder [id number] Yes Take 17 g by mouth daily.  Patient taking differently: Take 17 g by mouth daily as needed.   [name], MD Taking Active   pravastatin (PRAVACHOL) 20 MG tablet [id number] Yes TAKE 1 TABLET DAILY [name], MD Taking Active   sodium bicarbonate 650 MG tablet [id number] Yes Take 650 mg by mouth 3 (three) times daily. [provider] Taking Active   tamsulosin (FLOMAX) 0.4 MG CAPS capsule [id number] Yes TAKE 1 CAPSULE DAILY Dahlstedt, Annie Main, MD Taking Active   Vitamin D, Ergocalciferol, (DRISDOL) 1.25 MG (50000 UNIT) CAPS capsule [id number]  Take 50,000 Units by mouth once a week. [provider]  Active   zinc gluconate 50 MG tablet [id number]  Take by mouth. [provider]  Active             Patient Active Problem List   Diagnosis Date Noted   COVID-19 08/08/2021   Epilepsy ([place]) 11/07/2020   Polyneuropathy due to type 2 diabetes mellitus ([place]) 11/07/2020   Atherosclerosis of native artery of left lower extremity with ulceration of ankle ([place]) 08/23/2020   Loose stools 10/28/2019   Incontinence 07/08/2019   Osteoarthritis of left knee 04/15/2019   Diarrhea 04/15/2019   Common bile duct stone 04/18/2018   Cholecystocutaneous fistula    Physical deconditioning 02/28/2018   Choledocholithiasis 02/25/2018   Hyperkalemia 45/80/9983   Metabolic bone disease 38/25/0539   Fistula of bile duct 02/14/2018   Dermatitis 02/25/2017   Essential hypertension, benign  12/13/2015   RBBB (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 fascicular block)    Hypertensive heart disease 12/01/2014   Chronic venous insufficiency 11/16/2014   Carotid artery disease ([place]) 05/18/2014   Poor balance 09/11/2013   Difficulty walking 09/11/2013   Hx of falling 09/11/2013   History of stroke 09/07/2012   CKD (chronic kidney disease) stage 4, GFR 15-29 ml/min (HCC) 09/07/2012   Anemia of chronic disease 09/07/2012   Cholelithiasis 08/10/2012   Peripheral autonomic neuropathy due to diabetes mellitus ([place]) 03/14/2012   Seizure disorder, complex partial ([place]) 03/07/2012   Type 2 diabetes mellitus with diabetic  nephropathy ([place])    Hypothyroidism 03/09/2008   Mixed hyperlipidemia 03/09/2008   Renovascular hypertension 03/09/2008   Peripheral vascular disease ([place]) 03/09/2008   DEGENERATIVE JOINT DISEASE, KNEE 01/26/2008   Osteoarthritis of spine 01/26/2008   Spinal stenosis     Immunization History  Administered Date(s) Administered   Fluad Quad(high Dose 65+) 08/07/2019, 10/07/2020   H1N1 11/14/2008   Influenza Split 10/07/2011, 09/08/2012   Influenza Whole 08/22/2007, 08/27/2010   Influenza,inj,Quad PF,6+ Mos 08/22/2013, 09/26/2014, 09/26/2015, 09/01/2016, 09/20/2017, 09/26/2018   Moderna Sars-Covid-2 Vaccination 01/05/2021   Pneumococcal Conjugate-13 06/12/2014   Pneumococcal Polysaccharide-23 05/21/2004   Td 05/21/2004   Tdap 10/07/2011    Conditions to be addressed/monitored: HTN, HLD, and DMII  Care Plan : Medication Management  Updates made by Beryle Lathe, [place] since 11/11/2021 12:00 AM     Problem: T2DM, HTN, HLD   Priority: High  Onset Date: 10/14/2021     Long-Range Goal: Disease Progression Prevention   Start Date: 10/14/2021  Expected End Date: 01/12/2022  Recent Progress: On track  Priority: High  Note:   Current Barriers:  Unable to independently monitor therapeutic efficacy Unable to achieve control of hypertension and  hyperlipidemia  Pharmacist Clinical Goal(s):  Patient will Achieve adherence to monitoring guidelines and medication adherence to achieve therapeutic efficacy Achieve control of hypertension and hyperlipidemia as evidenced by improved blood pressure control and improved LDL through collaboration with PharmD and provider.   Interventions: 1:1 collaboration with [name], MD regarding development and update of comprehensive plan of care as evidenced by provider attestation and co-signature Inter-disciplinary care team collaboration (see longitudinal plan of care) Comprehensive medication review performed; medication list updated in electronic medical record  Type 2 Diabetes - Goal on Track (progressing): YES.: Followed by Dr. Pincus Sanes Controlled; Most recent A1c at goal of <7% per ADA guidelines Current medications: Humulin 70/30 10 units subcutaneously before breakfast and dinner Intolerances: none Taking medications as directed: no, patient only using 10 units if blood glucose >150 (which is most evenings per wife). Endocrinology has been notified.  Side effects thought to be attributed to current medication regimen: no Denies hypoglycemic symptoms (sweaty and shaky). Hypoglycemia prevention: not indicated at this time Current meal patterns: breakfast: eggs, sausage, waffles, and oatmeal or sometimes sausage biscuit; lunch: sandwich and cookie; dinner: fish, baked/grilled chicken, pork, and vegetables; snacks: occasional snacks; beverages: water Current exercise: not active On a statin: yes On aspirin 81 mg daily: no, on Plavix Last microalbumin/creatinine ratio: 269.9; on an ACEi/ARB: no, has chronic kidney disease stage 5 Last eye exam: completed within last year Last foot exam: overdue Pneumonia vaccine: series complete Influenza vaccine: overdue, needs this fall Current glucose readings:  fasting: 104-119 over past few days; blood glucose usually in the 200s before  dinner Continue insulin per endocrinology Instructed to monitor blood sugars twice a day at the following times: fasting (at least 8 hours since last food consumption), 5-15 minutes before dinner, and whenever patient experiences symptoms of hypo/hyperglycemia  May be reasonable to consider low dose once daily long acting basal insulin such as [place] or [place] instead of Humulin 70/30 as needed. This may reduce wide variations in blood glucose that the patient is experiencing. Recommendation sent to endocrinology.  Family continues to consider continuous glucose monitor but will let me know if they want to move forward.    Hypertension - Goal on Track (progressing): YES.: Blood pressure under fair control. Blood pressure is above goal of <130/80 mmHg per 2017  AHA/ACC guidelines. Current medications: amlodipine 10 mg by mouth once daily and hydralazine 25 mg by mouth twice daily Patient reports hydralazine recently added by nephrology. Blood pressure 140/70 at last nephrology visit on 11/07/21 and nephrology reports blood pressure improved.  Intolerances: none Taking medications as directed: yes Side effects thought to be attributed to current medication regimen: no Current home blood pressure: does not check and does not have a blood pressure machine Continue amlodipine 10 mg by mouth once daily and hydralazine 25 mg by mouth twice daily per nephrology  Consider further titration of hydralazine if blood pressure remains elevated above goal Encourage dietary sodium restriction/DASH diet Recommend home blood pressure monitoring to discuss at next visit Reviewed risks of hypertension, principles of treatment and consequences of untreated hypertension  Hyperlipidemia - Condition stable. Not addressed this visit.: Uncontrolled. LDL above goal of <70 due to very high risk given established clinical ASCVD per 2020 AACE/ACE guidelines. Triglycerides at goal of <150 per 2020 AACE/ACE guidelines. Current  medications: pravastatin 20 mg by mouth once daily Antiplatelet: clopidogrel 75 mg by mouth daily Intolerances: none Taking medications as directed: yes Side effects thought to be attributed to current medication regimen: no Continue pravastatin 20 mg by mouth once daily Consider increasing dose of pravastatin or consider addition of ezetimibe 10 mg by mouth daily for secondary prevention Encourage dietary reduction of high fat containing foods such as butter, nuts, bacon, egg yolks, etc. Reviewed risks of hyperlipidemia, principles of treatment and consequences of untreated hyperlipidemia Re-check lipid panel in 4-12 weeks  Patient Goals/Self-Care Activities Patient will:  Take medications as prescribed Check blood sugar twice a day at the following times: fasting (at least 8 hours since last food consumption), 5-15 minutes before dinner, and whenever patient experiences symptoms of hypo/hyperglycemia, document, and provide at future appointments Check blood pressure at least once daily, document, and provide at future appointments Engage in dietary modifications by less frequent dining out, decreased fat intake, and fewer sweetened foods & beverages  Follow Up Plan: Telephone follow up appointment with care management team member scheduled for: 01/13/22       Medication Assistance: None required.  Patient affirms current coverage meets needs.  Patient's preferred pharmacy is:  [place], [address] [place] [address] Phone: [phone] Fax: [phone]  [id number] [place], [place] [id number] [place]Phone: [phone]: [phone]  [id number] [place], [place]one: [phone]: [phone] Up:  Patient agrees to Care Plan and  Follow-up.  Plan: Telephone follow up appointment with care management team member scheduled for:  01/13/22  ChKennon HolterPharmD, [place]ilber[place]CPP Clinical Pharmacist Practitioner [facility] [phone]

## 2021-11-21 DIAGNOSIS — N184 Chronic kidney disease, stage 4 (severe): Secondary | ICD-10-CM | POA: Diagnosis not present

## 2021-12-05 DIAGNOSIS — N184 Chronic kidney disease, stage 4 (severe): Secondary | ICD-10-CM | POA: Diagnosis not present

## 2021-12-06 DIAGNOSIS — Z794 Long term (current) use of insulin: Secondary | ICD-10-CM | POA: Diagnosis not present

## 2021-12-06 DIAGNOSIS — I10 Essential (primary) hypertension: Secondary | ICD-10-CM

## 2021-12-06 DIAGNOSIS — E782 Mixed hyperlipidemia: Secondary | ICD-10-CM | POA: Diagnosis not present

## 2021-12-06 DIAGNOSIS — E1121 Type 2 diabetes mellitus with diabetic nephropathy: Secondary | ICD-10-CM

## 2021-12-11 DIAGNOSIS — E1142 Type 2 diabetes mellitus with diabetic polyneuropathy: Secondary | ICD-10-CM | POA: Diagnosis not present

## 2021-12-11 DIAGNOSIS — B351 Tinea unguium: Secondary | ICD-10-CM | POA: Diagnosis not present

## 2021-12-19 DIAGNOSIS — N184 Chronic kidney disease, stage 4 (severe): Secondary | ICD-10-CM | POA: Diagnosis not present

## 2021-12-29 ENCOUNTER — Other Ambulatory Visit: Payer: Self-pay | Admitting: Nurse Practitioner

## 2021-12-29 DIAGNOSIS — E038 Other specified hypothyroidism: Secondary | ICD-10-CM

## 2022-01-02 DIAGNOSIS — N184 Chronic kidney disease, stage 4 (severe): Secondary | ICD-10-CM | POA: Diagnosis not present

## 2022-01-13 ENCOUNTER — Ambulatory Visit (INDEPENDENT_AMBULATORY_CARE_PROVIDER_SITE_OTHER): Payer: Medicare Other | Admitting: Pharmacist

## 2022-01-13 DIAGNOSIS — Z794 Long term (current) use of insulin: Secondary | ICD-10-CM

## 2022-01-13 DIAGNOSIS — E1121 Type 2 diabetes mellitus with diabetic nephropathy: Secondary | ICD-10-CM

## 2022-01-13 DIAGNOSIS — E782 Mixed hyperlipidemia: Secondary | ICD-10-CM

## 2022-01-13 DIAGNOSIS — I639 Cerebral infarction, unspecified: Secondary | ICD-10-CM

## 2022-01-13 DIAGNOSIS — I10 Essential (primary) hypertension: Secondary | ICD-10-CM

## 2022-01-13 NOTE — Patient Instructions (Signed)
Chase [name],  It was great to talk to you today!  Please call me with any questions or concerns.  Visit Information  Following are the goals we discussed today:   Goals Addressed             This Visit's Progress    Medication Management       Patient Goals/Self-Care Activities Patient will:  Take medications as prescribed Check blood sugar twice a day at the following times: fasting (at least 8 hours since last food consumption), 5-15 minutes before dinner, and whenever patient experiences symptoms of hypo/hyperglycemia, document, and provide at future appointments Check blood pressure at least once daily, document, and provide at future appointments Engage in dietary modifications by less frequent dining out, decreased fat intake, and fewer sweetened foods & beverages           Follow-up plan: Face to Face appointment with care management team member scheduled for: 02/04/22  The patient verbalized understanding of instructions, educational materials, and care plan provided today and agreed to receive a mailed copy of patient instructions, educational materials, and care plan.   Please call the care guide team at [phone] if you need to cancel or reschedule your appointment.   Kennon Holter, PharmD, [place], CPP Clinical Pharmacist Practitioner [facility] [phone]

## 2022-01-13 NOTE — Chronic Care Management (AMB) (Signed)
Chronic Care Management Pharmacy Note  01/13/2022 Name:  Chase [name] MRN:  [id number] DOB:  February 11, 1931  Summary: Type 2 Diabetes Followed by Dr. Pincus Sanes Most recent A1c at goal of <7% per ADA guidelines Taking medications as directed: no, patient continues to only use 10 units if blood glucose >150 (which is most evenings per wife). Endocrinology has been notified.  Current glucose readings:  fasting: usually low 100s; before dinner: 200s Continue insulin per endocrinology May be reasonable to consider low dose once daily long acting basal insulin such as [place] or Tou[place]o instead of Humulin 70/30 as needed. This may reduce wide variations in blood glucose that the patient is experiencing. Recommendation sent to endocrinology with no change in regimen. Wife is resistant to the recommended change since she reports his current regimen "is working well". Once again discussed continuous glucose monitor with family. They asked whether it was an implantable device. They do not want to make a decision regarding this until they can see it in person. Will facilitate further continuous glucose monitor discussion when patient in office to see primary care provider in 3 weeks.   Hypertension Blood pressure under fair control. Blood pressure is above goal of <130/80 mmHg per 2017 AHA/ACC guidelines. Consider further titration of hydralazine if blood pressure remains elevated above goal  Hyperlipidemia/History of stoke/Coronary Artery Disease/Peripheral Vascular Disease LDL above goal of <55 due to extreme risk given established clinical ASCVD + diabetes per 2023 ADA Standards of Care in Diabetes. Triglycerides at goal of <150 per 2020 AACE/ACE guidelines. Antiplatelet: clopidogrel 75 mg by mouth daily Continue pravastatin 20 mg by mouth once daily Re-check lipid panel at next primary care provider visit. If LDL remains elevated, consider increasing dose of pravastatin or consider addition of  ezetimibe 10 mg by mouth daily for secondary prevention.  Subjective: Chase [name] is an 88 y.o. year old male who is a primary patient of Moshe Cipro, Norwood Levo, MD.  The CCM team was consulted for assistance with disease management and care coordination needs.    Engaged with patient, wife, and daughter by telephone for follow up visit in response to provider referral for pharmacy case management and/or care coordination services.   Consent to Services:  The patient was given information about Chronic Care Management services, agreed to services, and gave verbal consent prior to initiation of services.  Please see initial visit note for detailed documentation.   Patient Care Team: [name], MD as PCP - General Fran Lowes, MD (Inactive) as Consulting Physician (Nephrology) Phillips Odor, MD as Consulting Physician (Neurology) Cassandria Anger, MD as Consulting Physician (Endocrinology) Georganna Skeans, MD as Consulting Physician (General Surgery) Beryle Lathe, [facility] (Pharmacist)  Objective:  Lab Results  Component Value Date   CREATININE 3.25 (H) 05/13/2021   CREATININE 3.67 (H) 03/27/2021   CREATININE 3.15 (H) 10/01/2020    Lab Results  Component Value Date   HGBA1C 5.7 10/06/2021   Last diabetic Eye exam:  Lab Results  Component Value Date/Time   HMDIABEYEEXA Retinopathy (A) 03/26/2014 12:00 AM    Last diabetic Foot exam:  Lab Results  Component Value Date/Time   HMDIABFOOTEX yes 11/26/2010 12:00 AM        Component Value Date/Time   CHOL 169 03/27/2021 0958   TRIG 32 03/27/2021 0958   HDL 81 03/27/2021 0958   CHOLHDL 2.1 03/27/2021 0958   CHOLHDL 2.1 10/01/2020 1136   VLDL 8 03/12/2016 1139   [place] 80 03/27/2021  0958   LDLCALC 75 10/01/2020 1136    Hepatic Function Latest Ref Rng & Units 05/13/2021 03/27/2021 10/01/2020  Total Protein 6.5 - 8.1 g/dL 7.0 6.8 6.6  Albumin 3.5 - 5.0 g/dL 3.4(L) 4.0 -  AST 15 - 41 U/L '21 16 18   ' ALT 0 - 44 U/L '13 9 11  ' Alk Phosphatase 38 - 126 U/L 64 82 -  Total Bilirubin 0.3 - 1.2 mg/dL 0.6 0.3 0.4  Bilirubin, Direct 0.0 - 0.2 mg/dL - - -    Lab Results  Component Value Date/Time   TSH 5.670 (H) 09/29/2021 04:40 PM   TSH 3.030 03/27/2021 09:58 AM   FREET4 1.25 09/29/2021 04:40 PM   FREET4 1.22 04/02/2021 11:52 AM    CBC Latest Ref Rng & Units 05/13/2021 03/27/2021 07/05/2020  WBC 4.0 - 10.5 K/uL 6.2 6.0 6.2  Hemoglobin 13.0 - 17.0 g/dL 9.3(L) 9.1(L) 9.6(L)  Hematocrit 39.0 - 52.0 % 30.1(L) 27.3(L) 29.3(L)  Platelets 150 - 400 K/uL 252 - 224    Lab Results  Component Value Date/Time   VD25OH 77 07/05/2020 09:50 AM   VD25OH 57 07/03/2019 03:09 PM    Clinical ASCVD: Yes  The ASCVD Risk score (Arnett DK, et al., 2019) failed to calculate for the following reasons:   The 2019 ASCVD risk score is only valid for ages 56 to 86   The patient has a prior MI or stroke diagnosis    Social History   Tobacco Use  Smoking Status Never  Smokeless Tobacco Never   BP Readings from Last 3 Encounters:  10/09/21 (!) 144/65  10/06/21 (!) 152/70  10/02/21 (!) 167/68   Pulse Readings from Last 3 Encounters:  10/09/21 64  10/06/21 63  10/02/21 70   Wt Readings from Last 3 Encounters:  10/02/21 166 lb 6.4 oz (75.5 kg)  09/25/21 166 lb 6.4 oz (75.5 kg)  08/22/21 185 lb (83.9 kg)    Assessment: Review of patient past medical history, allergies, medications, health status, including review of consultants reports, laboratory and other test data, was performed as part of comprehensive evaluation and provision of chronic care management services.   SDOH:  (Social Determinants of Health) assessments and interventions performed:    CCM Care Plan  Allergies  Allergen Reactions   Bayer Advanced Aspirin [Aspirin] Nausea And Vomiting   Penicillins Nausea And Vomiting    Has patient had a PCN reaction causing immediate rash, facial/tongue/throat swelling, SOB or lightheadedness with  hypotension: unknown Has patient had a PCN reaction causing severe rash involving mucus membranes or skin necrosis: unknown Has patient had a PCN reaction that required hospitalization: unknown Has patient had a PCN reaction occurring within the last 10 years: no If all of the above answers are "NO", then may proceed with Cephalosporin use.    Medications Reviewed Today     Reviewed by Beryle Lathe, [facility] (Pharmacist) on 01/13/22 at 1418  Med List Status: <None>   Medication Order Taking? Sig Documenting Provider Last Dose Status Informant  amLODipine (NORVASC) 10 MG tablet [id number] Yes TAKE 1 TABLET DAILY [name], MD Taking Active   calcium carbonate (TUMS - DOSED IN MG ELEMENTAL CALCIUM) 500 MG chewable tablet [id number] Yes Chew 0.5 tablets by mouth 2 (two) times daily. [provider] Taking Active   clopidogrel (PLAVIX) 75 MG tablet [id number] Yes TAKE 1 TABLET DAILY [name], MD Taking Active   diphenoxylate-atropine (LOMOTIL) 2.5-0.025 MG tablet [id number] No Take one tablet by mouth  once daily, as needed, for watery, loose stool  Patient not taking: Reported on 01/13/2022   [name], MD Not Taking Active   glucose blood [facility] ULTRA) test strip [id number] Yes USE 1 STRIP THREE TIMES A DAY AS INSTRUCTED. Brita Romp, NP Taking Active   HUMULIN 70/30 KWIKPEN (70-30) 100 UNIT/ML KwikPen [id number] Yes INJECT 10 UNITS UNDER THE SKIN TWICE A DAY Nida, Marella Chimes, MD Taking Active            Med Note Beryle Lathe   Tue Oct 14, 2021  3:13 PM) Patient only using if blood glucose >150  hydrALAZINE (APRESOLINE) 25 MG tablet [id number] Yes Take 25 mg by mouth in the morning and at bedtime. [provider] Taking Active   Insulin Pen Needle (B-D ULTRAFINE III SHORT PEN) 31G X 8 MM MISC [id number] Yes 1 each by Does not apply route as directed. Cassandria Anger, MD Taking Active   levETIRAcetam (KEPPRA) 500 MG tablet  [id number] Yes Take 250 mg by mouth at bedtime.  [provider] Taking Active Spouse/Significant Other  levothyroxine (SYNTHROID) 112 MCG tablet [id number] Yes TAKE 1 TABLET DAILY BEFORE BREAKFAST Brita Romp, NP Taking Active   Multiple Vitamins-Minerals (MULTIVITAMIN WITH MINERALS) tablet [id number] Yes Take 1 tablet by mouth daily. [provider] Taking Active Spouse/Significant Other  olopatadine (PATANOL) 0.1 % ophthalmic solution [id number] Yes INSTILL 1 DROP INTO EACH EYE TWICE DAILY [name], MD Taking Active   OneTouch Delica Lancets 53I MISC [id number] Yes 1 each by Other route 2 (two) times daily. Cassandria Anger, MD Taking Active   polyethylene glycol powder (GLYCOLAX/MIRALAX) 17 GM/SCOOP powder [id number] Yes Take 17 g by mouth daily.  Patient taking differently: Take 17 g by mouth daily as needed.   [name], MD Taking Active   pravastatin (PRAVACHOL) 20 MG tablet [id number] Yes TAKE 1 TABLET DAILY [name], MD Taking Active   sodium bicarbonate 650 MG tablet [id number] Yes Take 650 mg by mouth 3 (three) times daily. [provider] Taking Active   tamsulosin (FLOMAX) 0.4 MG CAPS capsule [id number] Yes TAKE 1 CAPSULE DAILY Franchot Gallo, MD Taking Active             Patient Active Problem List   Diagnosis Date Noted   COVID-19 08/08/2021   Epilepsy ([place]) 11/07/2020   Polyneuropathy due to type 2 diabetes mellitus ([place]) 11/07/2020   Atherosclerosis of native artery of left lower extremity with ulceration of ankle ([place]) 08/23/2020   Loose stools 10/28/2019   Incontinence 07/08/2019   Osteoarthritis of left knee 04/15/2019   Diarrhea 04/15/2019   Common bile duct stone 04/18/2018   Cholecystocutaneous fistula    Physical deconditioning 02/28/2018   Choledocholithiasis 02/25/2018   Hyperkalemia 19/37/9024   Metabolic bone disease 09/73/5329   Fistula of bile duct 02/14/2018   Dermatitis 02/25/2017    Essential hypertension, benign 12/13/2015   RBBB (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 fascicular block)    Hypertensive heart disease 12/01/2014   Chronic venous insufficiency 11/16/2014   Carotid artery disease ([place]) 05/18/2014   Poor balance 09/11/2013   Difficulty walking 09/11/2013   Hx of falling 09/11/2013   History of stroke 09/07/2012   CKD (chronic kidney disease) stage 4, GFR 15-29 ml/min (HCC) 09/07/2012   Anemia of chronic disease 09/07/2012   Cholelithiasis 08/10/2012   Peripheral autonomic neuropathy due to diabetes mellitus ([place]) 03/14/2012   Seizure disorder, complex partial ([place]) 03/07/2012  Type 2 diabetes mellitus with diabetic nephropathy ([place])    Hypothyroidism 03/09/2008   Mixed hyperlipidemia 03/09/2008   Renovascular hypertension 03/09/2008   Peripheral vascular disease ([place]) 03/09/2008   DEGENERATIVE JOINT DISEASE, KNEE 01/26/2008   Osteoarthritis of spine 01/26/2008   Spinal stenosis     Immunization History  Administered Date(s) Administered   Fluad Quad(high Dose 65+) 08/07/2019, 10/07/2020   H1N1 11/14/2008   Influenza Split 10/07/2011, 09/08/2012   Influenza Whole 08/22/2007, 08/27/2010   Influenza,inj,Quad PF,6+ Mos 08/22/2013, 09/26/2014, 09/26/2015, 09/01/2016, 09/20/2017, 09/26/2018   Moderna Sars-Covid-2 Vaccination 01/01/2020, 02/01/2020, 01/05/2021, 06/02/2021   Pneumococcal Conjugate-13 06/12/2014   Pneumococcal Polysaccharide-23 05/21/2004   Td 05/21/2004   Tdap 10/07/2011    Conditions to be addressed/monitored: HTN, HLD, and DMII  Care Plan : Medication Management  Updates made by Beryle Lathe, [place] since 01/13/2022 12:00 AM     Problem: T2DM, HTN, HLD   Priority: High  Onset Date: 10/14/2021     Long-Range Goal: Disease Progression Prevention   Start Date: 10/14/2021  Expected End Date: 01/12/2022  Recent Progress: On track  Priority: High  Note:   Current Barriers:  Unable to independently monitor  therapeutic efficacy Unable to achieve control of hypertension and hyperlipidemia  Pharmacist Clinical Goal(s):   Through collaboration with PharmD and provider, patient will:  Achieve adherence to monitoring guidelines and medication adherence to achieve therapeutic efficacy Achieve control of hypertension and hyperlipidemia as evidenced by improved blood pressure control and improved LDL  Interventions: 1:1 collaboration with [name], MD regarding development and update of comprehensive plan of care as evidenced by provider attestation and co-signature Inter-disciplinary care team collaboration (see longitudinal plan of care) Comprehensive medication review performed; medication list updated in electronic medical record  Type 2 Diabetes - Goal on Track (progressing): YES.: Followed by Dr. Pincus Sanes Controlled; Most recent A1c at goal of <7% per ADA guidelines Current medications: Humulin 70/30 10 units subcutaneously before breakfast and dinner Intolerances: none Taking medications as directed: no, patient continues to only use 10 units if blood glucose >150 (which is most evenings per wife). Endocrinology has been notified.  Side effects thought to be attributed to current medication regimen: no Denies hypoglycemic symptoms (sweaty and shaky). Hypoglycemia prevention: not indicated at this time Current meal patterns: breakfast: eggs, sausage, waffles, and oatmeal or sometimes sausage biscuit; lunch: sandwich and cookie; dinner: fish, baked/grilled chicken, pork, and vegetables; snacks: occasional snacks; beverages: water Current exercise: not active On a statin: yes On aspirin 81 mg daily: no, on Plavix Last microalbumin/creatinine ratio: 269.9; on an ACEi/ARB: no, has chronic kidney disease stage 5 Last eye exam: completed within last year Last foot exam:  patient reports he completes at least yearly with Dr. Caprice Beaver Pneumonia vaccine: series complete Influenza  vaccine:  overdue Current glucose readings:  fasting: usually low 100s; before dinner: 200s Continue insulin per endocrinology Instructed to monitor blood sugars twice a day at the following times: fasting (at least 8 hours since last food consumption), 5-15 minutes before dinner, and whenever patient experiences symptoms of hypo/hyperglycemia  May be reasonable to consider low dose once daily long acting basal insulin such as [place] or [place] instead of Humulin 70/30 as needed. This may reduce wide variations in blood glucose that the patient is experiencing. Recommendation sent to endocrinology with no change in regimen. Wife is resistant to the recommended change since she reports his current regimen "is working well". Once again discussed continuous glucose monitor with family. They asked  whether it was an implantable device. They do not want to make a decision regarding this until they can see it in person. Will facilitate further continuous glucose monitor discussion when patient in office to see primary care provider in 3 weeks.   Hypertension - Goal on Track (progressing): YES.: Blood pressure under fair control. Blood pressure is above goal of <130/80 mmHg per 2017 AHA/ACC guidelines. Current medications: amlodipine 10 mg by mouth once daily and hydralazine 25 mg by mouth twice daily Intolerances: none Taking medications as directed: yes Side effects thought to be attributed to current medication regimen: no Current home blood pressure: does not check and does not have a blood pressure machine Continue amlodipine 10 mg by mouth once daily and hydralazine 25 mg by mouth twice daily per nephrology  Consider further titration of hydralazine if blood pressure remains elevated above goal Encourage dietary sodium restriction/DASH diet Recommend home blood pressure monitoring to discuss at next visit Reviewed risks of hypertension, principles of treatment and consequences of untreated  hypertension  Hyperlipidemia/History of stoke/Coronary Artery Disease/Peripheral Vascular Disease- Condition stable. Not addressed this visit.: Uncontrolled. LDL above goal of <55 due to extreme risk given established clinical ASCVD + diabetes per 2023 ADA Standards of Care in Diabetes. Triglycerides at goal of <150 per 2020 AACE/ACE guidelines. Current medications: pravastatin 20 mg by mouth once daily Antiplatelet: clopidogrel 75 mg by mouth daily Intolerances: none Taking medications as directed: yes Side effects thought to be attributed to current medication regimen: no Continue pravastatin 20 mg by mouth once daily Re-check lipid panel at next primary care provider visit. If LDL remains elevated, consider increasing dose of pravastatin or consider addition of ezetimibe 10 mg by mouth daily for secondary prevention. Encourage dietary reduction of high fat containing foods such as butter, nuts, bacon, egg yolks, etc. Reviewed risks of hyperlipidemia, principles of treatment and consequences of untreated hyperlipidemia  Patient Goals/Self-Care Activities Patient will:  Take medications as prescribed Check blood sugar twice a day at the following times: fasting (at least 8 hours since last food consumption), 5-15 minutes before dinner, and whenever patient experiences symptoms of hypo/hyperglycemia, document, and provide at future appointments Check blood pressure at least once daily, document, and provide at future appointments Engage in dietary modifications by less frequent dining out, decreased fat intake, and fewer sweetened foods & beverages  Follow Up Plan: Face to Face appointment with care management team member scheduled for: 02/04/22       Medication Assistance: None required.  Patient affirms current coverage meets needs.  Patient's preferred pharmacy is:  [place], [place] [address] [place]  [address] Phone: [phone] Fax: [phone]  [id number] [place] [id number]: [phone]: [phone]  [id number] [id number]: [phone]: [phone] Up:  Patient agrees to Care Plan and Follow-up.  Plan: Face to Face appointment with care management team member scheduled for: 02/04/22  ChKennon HolterPharmD, BCRed CrossCPP Clinical Pharmacist Practitioner [facility] [phone]

## 2022-01-16 DIAGNOSIS — I10 Essential (primary) hypertension: Secondary | ICD-10-CM | POA: Diagnosis not present

## 2022-01-16 DIAGNOSIS — E039 Hypothyroidism, unspecified: Secondary | ICD-10-CM | POA: Diagnosis not present

## 2022-01-16 DIAGNOSIS — I129 Hypertensive chronic kidney disease with stage 1 through stage 4 chronic kidney disease, or unspecified chronic kidney disease: Secondary | ICD-10-CM | POA: Diagnosis not present

## 2022-01-16 DIAGNOSIS — Z794 Long term (current) use of insulin: Secondary | ICD-10-CM | POA: Diagnosis not present

## 2022-01-16 DIAGNOSIS — K529 Noninfective gastroenteritis and colitis, unspecified: Secondary | ICD-10-CM | POA: Diagnosis not present

## 2022-01-16 DIAGNOSIS — E785 Hyperlipidemia, unspecified: Secondary | ICD-10-CM | POA: Diagnosis not present

## 2022-01-16 DIAGNOSIS — I2510 Atherosclerotic heart disease of native coronary artery without angina pectoris: Secondary | ICD-10-CM | POA: Diagnosis not present

## 2022-01-16 DIAGNOSIS — E1122 Type 2 diabetes mellitus with diabetic chronic kidney disease: Secondary | ICD-10-CM | POA: Diagnosis not present

## 2022-01-16 DIAGNOSIS — Z79890 Hormone replacement therapy (postmenopausal): Secondary | ICD-10-CM | POA: Diagnosis not present

## 2022-01-16 DIAGNOSIS — Z886 Allergy status to analgesic agent status: Secondary | ICD-10-CM | POA: Diagnosis not present

## 2022-01-16 DIAGNOSIS — Z880 Allergy status to penicillin: Secondary | ICD-10-CM | POA: Diagnosis not present

## 2022-01-16 DIAGNOSIS — N189 Chronic kidney disease, unspecified: Secondary | ICD-10-CM | POA: Diagnosis not present

## 2022-01-16 DIAGNOSIS — R109 Unspecified abdominal pain: Secondary | ICD-10-CM | POA: Diagnosis not present

## 2022-01-16 DIAGNOSIS — N200 Calculus of kidney: Secondary | ICD-10-CM | POA: Diagnosis not present

## 2022-01-16 DIAGNOSIS — K8020 Calculus of gallbladder without cholecystitis without obstruction: Secondary | ICD-10-CM | POA: Diagnosis not present

## 2022-01-16 DIAGNOSIS — I700 Atherosclerosis of aorta: Secondary | ICD-10-CM | POA: Diagnosis not present

## 2022-01-16 DIAGNOSIS — R319 Hematuria, unspecified: Secondary | ICD-10-CM | POA: Diagnosis not present

## 2022-01-16 DIAGNOSIS — I3139 Other pericardial effusion (noninflammatory): Secondary | ICD-10-CM | POA: Diagnosis not present

## 2022-01-22 ENCOUNTER — Other Ambulatory Visit: Payer: Self-pay

## 2022-01-22 ENCOUNTER — Ambulatory Visit (INDEPENDENT_AMBULATORY_CARE_PROVIDER_SITE_OTHER): Payer: Medicare Other

## 2022-01-22 DIAGNOSIS — Z0000 Encounter for general adult medical examination without abnormal findings: Secondary | ICD-10-CM | POA: Diagnosis not present

## 2022-01-22 NOTE — Patient Instructions (Signed)
°  Mr. Chase [name] , Thank you for taking time to come for your Medicare Wellness Visit. I appreciate your ongoing commitment to your health goals. Please review the following plan we discussed and let me know if I can assist you in the future.   These are the goals we discussed:  Goals      DIET - INCREASE LEAN PROTEINS     Exercise 3x per week (30 min per time)     Recommend starting a routine exercise program at least 3 days a week for 30-45 minutes at a time as tolerated.      Glycemic Management Optimized     Evidence-based guidance:  Anticipate A1C testing (point-of-care) every 3 to 6 months based on goal attainment.  Review mutually-set A1C goal or target range.  Anticipate screening for thyroid dysfunction, adrenal dysfunction and celiac disease based on presenting signs/symptoms.  Anticipate use of insulin, multidose or continuous subcutaneous infusion with periodic adjustments; consider active involvement of pharmacist.  Explore child and caregiver fear of hypoglycemic episodes that may impact adherence to treatment plan, such as withholding insulin and allowing blood sugars to be a little high.  Provide medical nutrition therapy and development of individualized eating plan.  Compare child or caregiver reported symptoms of hypo or hyperglycemia to blood glucose levels, diet and fluid intake, current medications, psychosocial and physiologic stressors, change in activity and barriers to care adherence.  Promote self-monitoring of blood glucose levels, interval or continuous.  Assess and address barriers regarding adherence to treatment and self-management plan, including patient factors, such as family cohesion, age, developmental ability, depression, anxiety, fear of hypoglycemia or weight gain, as   well as medication cost, side effects and complicated regimen.  Encourage developmentally-appropriate balance between dependence and independence in care, especially when intensifying  treatment, such as continuous blood glucose monitoring or the use of an insulin pump.  Consider referral to community-based diabetes education program, visiting nurse, community health worker or health coach.  Initiate or review diabetes medical management plan for school that may include storage of insulin and clean, private location for insulin administration or blood glucose monitoring.   Notes: Keeping sugar maintained.      Increase physical activity     Medication Management     Patient Goals/Self-Care Activities Patient will:  Take medications as prescribed Check blood sugar twice a day at the following times: fasting (at least 8 hours since last food consumption), 5-15 minutes before dinner, and whenever patient experiences symptoms of hypo/hyperglycemia, document, and provide at future appointments Check blood pressure at least once daily, document, and provide at future appointments Engage in dietary modifications by less frequent dining out, decreased fat intake, and fewer sweetened foods & beverages       Patient Stated     I want to eat more and move around more        This is a list of the screening recommended for you and due dates:  Health Maintenance  Topic Date Due   Zoster (Shingles) Vaccine (1 of 2) Never done   Complete foot exam   06/28/2021   Flu Shot  07/07/2021   COVID-19 Vaccine (5 - Booster for Moderna series) 07/28/2021   Tetanus Vaccine  10/06/2021   Eye exam for diabetics  03/20/2022*   Hemoglobin A1C  04/05/2022   Pneumonia Vaccine  Completed   HPV Vaccine  Aged Out  *Topic was postponed. The date shown is not the original due date.

## 2022-01-22 NOTE — Progress Notes (Signed)
I connected with  Chase [name] on 01/22/22 by a audio enabled telemedicine application and verified that I am speaking with the correct person using two identifiers.  Patient Location: Home  Provider Location: Office/Clinic  I discussed the limitations of evaluation and management by telemedicine. The patient expressed understanding and agreed to proceed.  Subjective:   Chase [name] is a 88 y.o. male who presents for Medicare Annual/Subsequent preventive examination.  Review of Systems          Objective:    There were no vitals filed for this visit. There is no height or weight on file to calculate BMI.  Advanced Directives 10/01/2020 09/26/2018 09/14/2018 05/27/2018 05/23/2018 02/26/2018 10/06/2017  Does Patient Have a Medical Advance Directive? No No No No No No No  Would patient like information on creating a medical advance directive? No - Patient declined Yes (ED - Information included in AVS) - No - Patient declined Yes (MAU/Ambulatory/Procedural Areas - Information given) No - Patient declined Yes (MAU/Ambulatory/Procedural Areas - Information given)  Pre-existing out of facility DNR order (yellow form or pink MOST form) - - - - - - -    Current Medications (verified) Outpatient Encounter Medications as of 01/22/2022  Medication Sig   amLODipine (NORVASC) 10 MG tablet TAKE 1 TABLET DAILY   calcium carbonate (TUMS - DOSED IN MG ELEMENTAL CALCIUM) 500 MG chewable tablet Chew 0.5 tablets by mouth 2 (two) times daily.   clopidogrel (PLAVIX) 75 MG tablet TAKE 1 TABLET DAILY   diphenoxylate-atropine (LOMOTIL) 2.5-0.025 MG tablet Take one tablet by mouth once daily, as needed, for watery, loose stool (Patient not taking: Reported on 01/13/2022)   glucose blood (ONETOUCH ULTRA) test strip USE 1 STRIP THREE TIMES A DAY AS INSTRUCTED.   HUMULIN 70/30 KWIKPEN (70-30) 100 UNIT/ML KwikPen INJECT 10 UNITS UNDER THE SKIN TWICE A DAY   hydrALAZINE (APRESOLINE) 25 MG tablet Take 25 mg by  mouth in the morning and at bedtime.   Insulin Pen Needle (B-D ULTRAFINE III SHORT PEN) 31G X 8 MM MISC 1 each by Does not apply route as directed.   levETIRAcetam (KEPPRA) 500 MG tablet Take 250 mg by mouth at bedtime.    levothyroxine (SYNTHROID) 112 MCG tablet TAKE 1 TABLET DAILY BEFORE BREAKFAST   Multiple Vitamins-Minerals (MULTIVITAMIN WITH MINERALS) tablet Take 1 tablet by mouth daily.   olopatadine (PATANOL) 0.1 % ophthalmic solution INSTILL 1 DROP INTO EACH EYE TWICE DAILY   OneTouch Delica Lancets 70J MISC 1 each by Other route 2 (two) times daily.   polyethylene glycol powder (GLYCOLAX/MIRALAX) 17 GM/SCOOP powder Take 17 g by mouth daily. (Patient taking differently: Take 17 g by mouth daily as needed.)   pravastatin (PRAVACHOL) 20 MG tablet TAKE 1 TABLET DAILY   sodium bicarbonate 650 MG tablet Take 650 mg by mouth 3 (three) times daily.   tamsulosin (FLOMAX) 0.4 MG CAPS capsule TAKE 1 CAPSULE DAILY   No facility-administered encounter medications on file as of 01/22/2022.    Allergies (verified) Bayer advanced aspirin [aspirin] and Penicillins   History: Past Medical History:  Diagnosis Date   Bradycardia 03/15/2012   Carotid artery occlusion    Choledocholithiasis 02/25/2018   CKD (chronic kidney disease) stage 4, GFR 15-29 ml/min (HCC)    Complete lesion of cervical spinal cord ([place]) 03/14/3012   Stable since 2006   CVA (cerebrovascular accident) ([place]) 09/07/12   right sided weakness   Depressive disorder, not elsewhere classified    Diabetes mellitus approx  1994   Diabetic neuropathy ([place])    History of kidney stones    Hypertensive heart disease    Hypothyroidism approx 2000   Lacunar stroke, acute ([place]) 03/14/2012   Obesity    Osteoarthrosis, unspecified whether generalized or localized, lower leg    Other and unspecified hyperlipidemia    Peripheral vascular disease, unspecified ([place])    Seizures ([place])    unknown etiology; on meds, last seizure was 2015   Spinal  stenosis, unspecified region other than cervical    Spondylosis of unspecified site without mention of myelopathy    Past Surgical History:  Procedure Laterality Date   BILIARY STENT PLACEMENT N/A 05/27/2018   Procedure: BILIARY STENT PLACEMENT;  Surgeon: Rogene Houston, MD;  [facility];  Service: Endoscopy;  Laterality: N/A;   COLONOSCOPY N/A 08/10/2013   Procedure: COLONOSCOPY;  Surgeon: Rogene Houston, MD;  [facility];  Service: Endoscopy;  Laterality: N/A;  240   ERCP N/A 03/02/2018   Procedure: ENDOSCOPIC RETROGRADE CHOLANGIOPANCREATOGRAPHY (ERCP) With sphincterotomy and stent placement;  Surgeon: Rogene Houston, MD;  [facility];  Service: Gastroenterology;  Laterality: N/A;   ERCP N/A 05/27/2018   Procedure: ENDOSCOPIC RETROGRADE CHOLANGIOPANCREATOGRAPHY (ERCP);  Surgeon: Rogene Houston, MD;  [facility];  Service: Endoscopy;  Laterality: N/A;   ERCP N/A 09/16/2018   Procedure: ENDOSCOPIC RETROGRADE CHOLANGIOPANCREATOGRAPHY (ERCP);  Surgeon: Rogene Houston, MD;  [facility];  Service: Endoscopy;  Laterality: N/A;   GASTROINTESTINAL STENT REMOVAL N/A 05/27/2018   Procedure: Biliary STENT REMOVAL;  Surgeon: Rogene Houston, MD;  [facility];  Service: Endoscopy;  Laterality: N/A;   GASTROINTESTINAL STENT REMOVAL N/A 09/16/2018   Procedure: GASTROINTESTINAL STENT REMOVAL;  Surgeon: Rogene Houston, MD;  [facility];  Service: Endoscopy;  Laterality: N/A;   kidney stones left x2  1975   KIDNEY SURGERY     Ruptured left kidney 30 yrs ago  from a kidney stone   LITHOTRIPSY N/A 05/27/2018   Procedure: MECHANICAL LITHOTRIPSY;  Surgeon: Rogene Houston, MD;  [facility];  Service: Endoscopy;  Laterality: N/A;   REMOVAL OF STONES N/A 09/16/2018   Procedure: REMOVAL OF MULTIPLE STONES WITH BASKET AND BALLOON;  Surgeon: Rogene Houston, MD;  [facility];  Service: Endoscopy;   Laterality: N/A;   SPHINCTEROTOMY N/A 05/27/2018   Procedure: SPHINCTEROTOMY extended;  Surgeon: Rogene Houston, MD;  [facility];  Service: Endoscopy;  Laterality: N/A;   SPYGLASS CHOLANGIOSCOPY N/A 05/27/2018   Procedure: SPYGLASS CHOLANGIOSCOPY;  Surgeon: Rogene Houston, MD;  [facility];  Service: Endoscopy;  Laterality: N/A;   Family History  Problem Relation Age of Onset   Diabetes Mother    Prostate cancer Father    Hypertension Brother    Hypertension Brother    Diabetes Brother    Stroke Brother    Diabetes Brother    Diabetes Daughter    Diabetes Daughter    Social History   Socioeconomic History   Marital status: Married    Spouse name: Not on file   Number of children: 5   Years of education: 8   Highest education level: 8th grade  Occupational History   Occupation: retired     Fish farm manager: RETIRED  Tobacco Use   Smoking status: Never   Smokeless tobacco: Never  Vaping Use   Vaping Use: Never used  Substance and Sexual Activity  Alcohol use: No    Alcohol/week: 0.0 standard drinks   Drug use: No   Sexual activity: Not Currently  Other Topics Concern   Not on file  Social History Narrative   Not on file   Social Determinants of Health   Financial Resource Strain: Not on file  Food Insecurity: Not on file  Transportation Needs: Not on file  Physical Activity: Not on file  Stress: Not on file  Social Connections: Not on file    Tobacco Counseling Counseling given: Not Answered   Clinical Intake:                 Diabetic?yes          Activities of Daily Living No flowsheet data found.  Patient Care Team: [name], MD as PCP - General Fran Lowes, MD (Inactive) as Consulting Physician (Nephrology) Phillips Odor, MD as Consulting Physician (Neurology) Cassandria Anger, MD as Consulting Physician (Endocrinology) Georganna Skeans, MD as Consulting Physician (General  Surgery) Beryle Lathe, [facility] (Pharmacist)  Indicate any recent Medical Services you may have received from other than [facility] providers in the past year (date may be approximate).     Assessment:   This is a routine wellness examination for [place].  Hearing/Vision screen No results found.  Dietary issues and exercise activities discussed:     Goals Addressed   None   Depression Screen PHQ 2/9 Scores 08/08/2021 03/20/2021 10/01/2020 10/01/2020 09/28/2019 07/03/2019 12/20/2018  PHQ - 2 Score 1 0 0 0 0 0 0  PHQ- 9 Score - - - - - - -    Fall Risk Fall Risk  08/08/2021 03/20/2021 10/01/2020 06/28/2020 10/17/2019  Falls in the past year? 0 1 0 0 0  Number falls in past yr: 0 0 0 0 0  Injury with Fall? 0 0 0 0 0  Risk for fall due to : No Fall Risks Impaired balance/gait No Fall Risks History of fall(s);Impaired balance/gait;Impaired mobility Impaired balance/gait;Impaired mobility;Impaired vision  Follow up Falls evaluation completed Falls evaluation completed Falls evaluation completed Falls evaluation completed -    FALL RISK PREVENTION PERTAINING TO THE HOME:  Any stairs in or around the home? No  If so, are there any without handrails?  NA Home free of loose throw rugs in walkways, pet beds, electrical cords, etc? No  Adequate lighting in your home to reduce risk of falls? Yes   ASSISTIVE DEVICES UTILIZED TO PREVENT FALLS:  Life alert? No  Use of a cane, walker or w/c? Yes  Grab bars in the bathroom? Yes  Shower chair or bench in shower? Yes  Elevated toilet seat or a handicapped toilet? Yes   TIMED UP AND GO:  Was the test performed? No .  Length of time to ambulate 10 feet: phone visit    Cognitive Function: MMSE - Mini Mental State Exam 10/06/2017  Orientation to time 1  Orientation to Place 5  Registration 3  Attention/ Calculation 4  Recall 1  Language- name 2 objects 2  Language- repeat 0  Language- follow 3 step command 3  Language- read & follow  direction 1  Write a sentence 1  Copy design 1  Total score 22     6CIT Screen 10/01/2020 09/28/2019 09/26/2018  What Year? 0 points 0 points 4 points  What month? 3 points 0 points 0 points  What time? 3 points 0 points 3 points  Count back from 20 0 points 0 points 0 points  Months in reverse 4 points 0 points 4 points  Repeat phrase 10 points 0 points 2 points  Total Score 20 0 13    Immunizations Immunization History  Administered Date(s) Administered   Fluad Quad(high Dose 65+) 08/07/2019, 10/07/2020   H1N1 11/14/2008   Influenza Split 10/07/2011, 09/08/2012   Influenza Whole 08/22/2007, 08/27/2010   Influenza,inj,Quad PF,6+ Mos 08/22/2013, 09/26/2014, 09/26/2015, 09/01/2016, 09/20/2017, 09/26/2018   Moderna Sars-Covid-2 Vaccination 01/01/2020, 02/01/2020, 01/05/2021, 06/02/2021   Pneumococcal Conjugate-13 06/12/2014   Pneumococcal Polysaccharide-23 05/21/2004   Td 05/21/2004   Tdap 10/07/2011    TDAP status: Due, Education has been provided regarding the importance of this vaccine. Advised may receive this vaccine at local pharmacy or Health Dept. Aware to provide a copy of the vaccination record if obtained from local pharmacy or Health Dept. Verbalized acceptance and understanding.  Flu Vaccine status: Up to date   Pneumonia Vaccine: Up to date  Covid-19 vaccine status: Completed vaccines  Qualifies for Shingles Vaccine? No   Zostavax completed No   Shingrix Completed?: No.    Education has been provided regarding the importance of this vaccine. Patient has been advised to call insurance company to determine out of pocket expense if they have not yet received this vaccine. Advised may also receive vaccine at local pharmacy or Health Dept. Verbalized acceptance and understanding.  Screening Tests Health Maintenance  Topic Date Due   Zoster Vaccines- Shingrix (1 of 2) Never done   FOOT EXAM  06/28/2021   INFLUENZA VACCINE  07/07/2021   COVID-19 Vaccine (5 -  Booster for Moderna series) 07/28/2021   TETANUS/TDAP  10/06/2021   OPHTHALMOLOGY EXAM  03/20/2022 (Originally 05/06/2018)   HEMOGLOBIN A1C  04/05/2022   Pneumonia Vaccine 58+ Years old  Completed   HPV VACCINES  Aged Out    Health Maintenance  Health Maintenance Due  Topic Date Due   Zoster Vaccines- Shingrix (1 of 2) Never done   FOOT EXAM  06/28/2021   INFLUENZA VACCINE  07/07/2021   COVID-19 Vaccine (5 - Booster for Moderna series) 07/28/2021   TETANUS/TDAP  10/06/2021    Colorectal cancer screening: Type of screening: Colonoscopy. Completed 2014. Repeat every 10 years  Lung Cancer Screening: (Low Dose CT Chest recommended if Age 57-80 years, 30 pack-year currently smoking OR have quit w/in 15years.) does not qualify.   Lung Cancer Screening Referral: NA  Additional Screening:  Hepatitis C Screening: does not qualify;  Vision Screening: Recommended annual ophthalmology exams for early detection of glaucoma and other disorders of the eye. Is the patient up to date with their annual eye exam?  No  Who is the provider or what is the name of the office in which the patient attends annual eye exams? Dr. Radford Pax  If pt is not established with a provider, would they like to be referred to a provider to establish care? No .   Dental Screening: Recommended annual dental exams for proper oral hygiene  Community Resource Referral / Chronic Care Management: CRR required this visit?  No   CCM required this visit?  No      Plan:     I have personally reviewed and noted the following in the patients chart:   Medical and social history Use of alcohol, tobacco or illicit drugs  Current medications and supplements including opioid prescriptions. Patient is not currently taking opioid prescriptions. Functional ability and status Nutritional status Physical activity Advanced directives List of other physicians Hospitalizations, surgeries, and ER visits in previous 22  months Vitals Screenings to include cognitive, depression, and falls Referrals and appointments  In addition, I have reviewed and discussed with patient certain preventive protocols, quality metrics, and best practice recommendations. A written personalized care plan for preventive services as well as general preventive health recommendations were provided to patient.     Philis Pique Wilmont [place], [place]   01/22/2022   Nurse Notes:  Mr. Gayton , Thank you for taking time to come for your Medicare Wellness Visit. I appreciate your ongoing commitment to your health goals. Please review the following plan we discussed and let me know if I can assist you in the future.   These are the goals we discussed:  Goals      DIET - INCREASE LEAN PROTEINS     Exercise 3x per week (30 min per time)     Recommend starting a routine exercise program at least 3 days a week for 30-45 minutes at a time as tolerated.      Glycemic Management Optimized     Evidence-based guidance:  Anticipate A1C testing (point-of-care) every 3 to 6 months based on goal attainment.  Review mutually-set A1C goal or target range.  Anticipate screening for thyroid dysfunction, adrenal dysfunction and celiac disease based on presenting signs/symptoms.  Anticipate use of insulin, multidose or continuous subcutaneous infusion with periodic adjustments; consider active involvement of pharmacist.  Explore child and caregiver fear of hypoglycemic episodes that may impact adherence to treatment plan, such as withholding insulin and allowing blood sugars to be a little high.  Provide medical nutrition therapy and development of individualized eating plan.  Compare child or caregiver reported symptoms of hypo or hyperglycemia to blood glucose levels, diet and fluid intake, current medications, psychosocial and physiologic stressors, change in activity and barriers to care adherence.  Promote self-monitoring of blood glucose levels, interval  or continuous.  Assess and address barriers regarding adherence to treatment and self-management plan, including patient factors, such as family cohesion, age, developmental ability, depression, anxiety, fear of hypoglycemia or weight gain, as   well as medication cost, side effects and complicated regimen.  Encourage developmentally-appropriate balance between dependence and independence in care, especially when intensifying treatment, such as continuous blood glucose monitoring or the use of an insulin pump.  Consider referral to community-based diabetes education program, visiting nurse, community health worker or health coach.  Initiate or review diabetes medical management plan for school that may include storage of insulin and clean, private location for insulin administration or blood glucose monitoring.   Notes: Keeping sugar maintained.      Increase physical activity     Medication Management     Patient Goals/Self-Care Activities Patient will:  Take medications as prescribed Check blood sugar twice a day at the following times: fasting (at least 8 hours since last food consumption), 5-15 minutes before dinner, and whenever patient experiences symptoms of hypo/hyperglycemia, document, and provide at future appointments Check blood pressure at least once daily, document, and provide at future appointments Engage in dietary modifications by less frequent dining out, decreased fat intake, and fewer sweetened foods & beverages       Patient Stated     I want to eat more and move around more        This is a list of the screening recommended for you and due dates:  Health Maintenance  Topic Date Due   Zoster (Shingles) Vaccine (1 of 2) Never done   Complete foot exam   06/28/2021   Flu  Shot  07/07/2021   COVID-19 Vaccine (5 - Booster for Moderna series) 07/28/2021   Tetanus Vaccine  10/06/2021   Eye exam for diabetics  03/20/2022*   Hemoglobin A1C  04/05/2022   Pneumonia  Vaccine  Completed   HPV Vaccine  Aged Out  *Topic was postponed. The date shown is not the original due date.

## 2022-01-26 ENCOUNTER — Other Ambulatory Visit: Payer: Self-pay | Admitting: Family Medicine

## 2022-01-30 DIAGNOSIS — D631 Anemia in chronic kidney disease: Secondary | ICD-10-CM | POA: Diagnosis not present

## 2022-01-30 DIAGNOSIS — N184 Chronic kidney disease, stage 4 (severe): Secondary | ICD-10-CM | POA: Diagnosis not present

## 2022-02-03 DIAGNOSIS — Z794 Long term (current) use of insulin: Secondary | ICD-10-CM | POA: Diagnosis not present

## 2022-02-03 DIAGNOSIS — E782 Mixed hyperlipidemia: Secondary | ICD-10-CM

## 2022-02-03 DIAGNOSIS — E1121 Type 2 diabetes mellitus with diabetic nephropathy: Secondary | ICD-10-CM | POA: Diagnosis not present

## 2022-02-03 DIAGNOSIS — I10 Essential (primary) hypertension: Secondary | ICD-10-CM

## 2022-02-03 DIAGNOSIS — I639 Cerebral infarction, unspecified: Secondary | ICD-10-CM | POA: Diagnosis not present

## 2022-02-04 ENCOUNTER — Encounter: Payer: Self-pay | Admitting: Family Medicine

## 2022-02-04 ENCOUNTER — Ambulatory Visit (INDEPENDENT_AMBULATORY_CARE_PROVIDER_SITE_OTHER): Payer: Medicare Other | Admitting: Pharmacist

## 2022-02-04 ENCOUNTER — Other Ambulatory Visit: Payer: Self-pay

## 2022-02-04 ENCOUNTER — Ambulatory Visit (INDEPENDENT_AMBULATORY_CARE_PROVIDER_SITE_OTHER): Payer: Medicare Other | Admitting: Family Medicine

## 2022-02-04 VITALS — BP 136/70 | HR 64

## 2022-02-04 VITALS — BP 156/78 | HR 64 | Resp 16 | Ht 68.0 in

## 2022-02-04 DIAGNOSIS — E038 Other specified hypothyroidism: Secondary | ICD-10-CM | POA: Diagnosis not present

## 2022-02-04 DIAGNOSIS — Z794 Long term (current) use of insulin: Secondary | ICD-10-CM

## 2022-02-04 DIAGNOSIS — E1121 Type 2 diabetes mellitus with diabetic nephropathy: Secondary | ICD-10-CM

## 2022-02-04 DIAGNOSIS — I10 Essential (primary) hypertension: Secondary | ICD-10-CM

## 2022-02-04 DIAGNOSIS — R2689 Other abnormalities of gait and mobility: Secondary | ICD-10-CM | POA: Diagnosis not present

## 2022-02-04 DIAGNOSIS — I150 Renovascular hypertension: Secondary | ICD-10-CM | POA: Diagnosis not present

## 2022-02-04 DIAGNOSIS — E782 Mixed hyperlipidemia: Secondary | ICD-10-CM

## 2022-02-04 DIAGNOSIS — I639 Cerebral infarction, unspecified: Secondary | ICD-10-CM

## 2022-02-04 DIAGNOSIS — Z9181 History of falling: Secondary | ICD-10-CM | POA: Diagnosis not present

## 2022-02-04 NOTE — Patient Instructions (Addendum)
F/u  end August, flu vaccine at  visit, call if you need me sooner  You will be referred to [facility] in home PT twice weekly for 6 weeks   I WILL arrange for a sooner appointment with Endocrinology to get you set upo for the freestyle libre meter and regarding concerns of fatigue   Careful not to fall  Iron has been low in the past which may also cause fatigue, you are seeing kidney Specialist for this  Please intentionally eat more often and larger amounts of food to increase your strength and gain weight  Thanks for choosing [facility], we consider it a privelige to serve you.

## 2022-02-04 NOTE — Chronic Care Management (AMB) (Signed)
Chronic Care Management Pharmacy Note  02/04/2022 Name:  Chase [name] MRN:  [id number] DOB:  September 11, 1931  Summary: Type 2 Diabetes Followed by Dr. Pincus Sanes Most recent A1c at goal of <7% per ADA guidelines Patient continues to only use 10 units if blood glucose >150 (which is most evenings per wife). Continue insulin per endocrinology Given other comorbidities, may be reasonable to either consider discontinuation of insulin or switch from 70/30 to low dose once daily long acting basal insulin such as [place] or [place]o.  Had face-to-face discussion regarding continuous glucose monitor and showed them a demo of the [employer] 2. Family plans to discuss further with primary care provider and endocrinology.   Hypertension Blood pressure under poor control.  Current medications: amlodipine 10 mg by mouth once daily and hydralazine 25 mg by mouth twice daily BP continues to be elevated, would recommend further titration of hydralazine  Hyperlipidemia/History of stoke/Coronary Artery Disease/Peripheral Vascular Disease LDL above goal of <55 due to extreme risk given established clinical ASCVD + diabetes per 2023 ADA Standards of Care in Diabetes. Continue pravastatin 20 mg by mouth once daily If LDL remains elevated upon next check, may be reasonable to consider increasing dose of pravastatin or consider addition of ezetimibe 10 mg by mouth daily for secondary prevention.  Subjective: Chase [name] is an 88 y.o. year old male who is a primary patient of Moshe Cipro, Norwood Levo, MD.  The CCM team was consulted for assistance with disease management and care coordination needs.    Engaged with patient face to face for follow up visit in response to provider referral for pharmacy case management and/or care coordination services.   Consent to Services:  The patient was given information about Chronic Care Management services, agreed to services, and gave verbal consent prior to  initiation of services.  Please see initial visit note for detailed documentation.   Patient Care Team: [name], MD as PCP - General Fran Lowes, MD (Inactive) as Consulting Physician (Nephrology) Phillips Odor, MD as Consulting Physician (Neurology) Cassandria Anger, MD as Consulting Physician (Endocrinology) Georganna Skeans, MD as Consulting Physician (General Surgery) Beryle Lathe, [facility] (Pharmacist)  Objective:  Lab Results  Component Value Date   CREATININE 3.25 (H) 05/13/2021   CREATININE 3.67 (H) 03/27/2021   CREATININE 3.15 (H) 10/01/2020    Lab Results  Component Value Date   HGBA1C 5.7 10/06/2021   Last diabetic Eye exam:  Lab Results  Component Value Date/Time   HMDIABEYEEXA Retinopathy (A) 03/26/2014 12:00 AM    Last diabetic Foot exam:  Lab Results  Component Value Date/Time   HMDIABFOOTEX yes 11/26/2010 12:00 AM        Component Value Date/Time   CHOL 169 03/27/2021 0958   TRIG 32 03/27/2021 0958   HDL 81 03/27/2021 0958   CHOLHDL 2.1 03/27/2021 0958   CHOLHDL 2.1 10/01/2020 1136   VLDL 8 03/12/2016 1139   LDLCALC 80 03/27/2021 0958   LDLCALC 75 10/01/2020 1136    Hepatic Function Latest Ref Rng & Units 05/13/2021 03/27/2021 10/01/2020  Total Protein 6.5 - 8.1 g/dL 7.0 6.8 6.6  Albumin 3.5 - 5.0 g/dL 3.4(L) 4.0 -  AST 15 - 41 U/L '21 16 18  ' ALT 0 - 44 U/L '13 9 11  ' Alk Phosphatase 38 - 126 U/L 64 82 -  Total Bilirubin 0.3 - 1.2 mg/dL 0.6 0.3 0.4  Bilirubin, Direct 0.0 - 0.2 mg/dL - - -    Lab  Results  Component Value Date/Time   TSH 5.670 (H) 09/29/2021 04:40 PM   TSH 3.030 03/27/2021 09:58 AM   FREET4 1.25 09/29/2021 04:40 PM   FREET4 1.22 04/02/2021 11:52 AM    CBC Latest Ref Rng & Units 05/13/2021 03/27/2021 07/05/2020  WBC 4.0 - 10.5 K/uL 6.2 6.0 6.2  Hemoglobin 13.0 - 17.0 g/dL 9.3(L) 9.1(L) 9.6(L)  Hematocrit 39.0 - 52.0 % 30.1(L) 27.3(L) 29.3(L)  Platelets 150 - 400 K/uL 252 - 224    Lab Results   Component Value Date/Time   VD25OH 77 07/05/2020 09:50 AM   VD25OH 57 07/03/2019 03:09 PM    Clinical ASCVD: Yes  The ASCVD Risk score (Arnett DK, et al., 2019) failed to calculate for the following reasons:   The 2019 ASCVD risk score is only valid for ages 106 to 15   The patient has a prior MI or stroke diagnosis     Social History   Tobacco Use  Smoking Status Never  Smokeless Tobacco Never   BP Readings from Last 3 Encounters:  02/04/22 (!) 156/78  10/09/21 (!) 144/65  10/06/21 (!) 152/70   Pulse Readings from Last 3 Encounters:  02/04/22 64  10/09/21 64  10/06/21 63   Wt Readings from Last 3 Encounters:  10/02/21 166 lb 6.4 oz (75.5 kg)  09/25/21 166 lb 6.4 oz (75.5 kg)  08/22/21 185 lb (83.9 kg)    Assessment: Review of patient past medical history, allergies, medications, health status, including review of consultants reports, laboratory and other test data, was performed as part of comprehensive evaluation and provision of chronic care management services.   SDOH:  (Social Determinants of Health) assessments and interventions performed:    CCM Care Plan  Allergies  Allergen Reactions   Bayer Advanced Aspirin [Aspirin] Nausea And Vomiting   Penicillins Nausea And Vomiting    Has patient had a PCN reaction causing immediate rash, facial/tongue/throat swelling, SOB or lightheadedness with hypotension: unknown Has patient had a PCN reaction causing severe rash involving mucus membranes or skin necrosis: unknown Has patient had a PCN reaction that required hospitalization: unknown Has patient had a PCN reaction occurring within the last 10 years: no If all of the above answers are "NO", then may proceed with Cephalosporin use.    Medications Reviewed Today     Reviewed by Beryle Lathe, [facility] (Pharmacist) on 02/04/22 at 1549  Med List Status: <None>   Medication Order Taking? Sig Documenting Provider Last Dose Status Informant  amLODipine (NORVASC)  10 MG tablet [id number] Yes TAKE 1 TABLET DAILY [name], MD Taking Active   calcium carbonate (TUMS - DOSED IN MG ELEMENTAL CALCIUM) 500 MG chewable tablet [id number] Yes Chew 0.5 tablets by mouth 2 (two) times daily. [provider] Taking Active   ciprofloxacin (CIPRO) 500 MG tablet [id number] No Take 500 mg by mouth 2 (two) times daily. [provider] Unknown Active   clopidogrel (PLAVIX) 75 MG tablet [id number] Yes TAKE 1 TABLET DAILY [name], MD Taking Active   diphenoxylate-atropine (LOMOTIL) 2.5-0.025 MG tablet [id number] No Take one tablet by mouth once daily, as needed, for watery, loose stool [name], MD Unknown Active   glucose blood (ONETOUCH ULTRA) test strip [id number] Yes USE 1 STRIP THREE TIMES A DAY AS INSTRUCTED. Brita Romp, NP Taking Active   HUMULIN 70/30 KWIKPEN (70-30) 100 UNIT/ML KwikPen [id number] Yes INJECT 10 UNITS UNDER THE SKIN TWICE A DAY Nida, Marella Chimes, MD Taking Active  Med Note Beryle Lathe   Tue Oct 14, 2021  3:13 PM) Patient only using if blood glucose >150  hydrALAZINE (APRESOLINE) 25 MG tablet [id number] Yes Take 25 mg by mouth in the morning and at bedtime. [provider] Taking Active   Insulin Pen Needle (B-D ULTRAFINE III SHORT PEN) 31G X 8 MM MISC [id number] Yes 1 each by Does not apply route as directed. Cassandria Anger, MD Taking Active   levETIRAcetam (KEPPRA) 500 MG tablet [id number] Yes Take 250 mg by mouth at bedtime.  [provider] Taking Active Spouse/Significant Other  levothyroxine (SYNTHROID) 112 MCG tablet [id number] Yes TAKE 1 TABLET DAILY BEFORE BREAKFAST Reardon, Juanetta Beets, NP Taking Active   metroNIDAZOLE (FLAGYL) 500 MG tablet [id number] No Take 500 mg by mouth 3 (three) times daily. [provider] Unknown Active   Multiple Vitamins-Minerals (MULTIVITAMIN WITH MINERALS) tablet [id number] Yes Take 1 tablet by mouth daily. [provider] Taking Active Spouse/Significant Other  olopatadine (PATANOL) 0.1 % ophthalmic solution [id number] Yes INSTILL 1 DROP INTO EACH EYE TWICE DAILY [name], MD Taking Active   OneTouch Delica Lancets 94B MISC [id number] Yes 1 each by Other route 2 (two) times daily. Cassandria Anger, MD Taking Active   polyethylene glycol powder (GLYCOLAX/MIRALAX) 17 GM/SCOOP powder [id number] Yes Take 17 g by mouth daily.  Patient taking differently: Take 17 g by mouth daily as needed.   [name], MD Taking Active   pravastatin (PRAVACHOL) 20 MG tablet [id number] Yes TAKE 1 TABLET DAILY [name], MD Taking Active   sodium bicarbonate 650 MG tablet [id number] Yes Take 650 mg by mouth 3 (three) times daily. [provider] Taking Active   tamsulosin (FLOMAX) 0.4 MG CAPS capsule [id number] Yes TAKE 1 CAPSULE DAILY Franchot Gallo, MD Taking Active             Patient Active Problem List   Diagnosis Date Noted   COVID-19 08/08/2021   Epilepsy ([place]) 11/07/2020   Polyneuropathy due to type 2 diabetes mellitus ([place]) 11/07/2020   Atherosclerosis of native artery of left lower extremity with ulceration of ankle ([place]) 08/23/2020   Loose stools 10/28/2019   Incontinence 07/08/2019   Osteoarthritis of left knee 04/15/2019   Diarrhea 04/15/2019   Common bile duct stone 04/18/2018   Cholecystocutaneous fistula    Physical deconditioning 02/28/2018   Choledocholithiasis 02/25/2018   Hyperkalemia 63/84/6659   Metabolic bone disease 93/57/0177   Fistula of bile duct 02/14/2018   Dermatitis 02/25/2017   Essential hypertension, benign 12/13/2015   RBBB (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 fascicular block)    Hypertensive heart disease 12/01/2014   Chronic venous insufficiency 11/16/2014   Carotid artery disease ([place]) 05/18/2014   Poor balance 09/11/2013   Difficulty walking 09/11/2013   Hx of falling 09/11/2013   History of stroke 09/07/2012    CKD (chronic kidney disease) stage 4, GFR 15-29 ml/min (HCC) 09/07/2012   Anemia of chronic disease 09/07/2012   Cholelithiasis 08/10/2012   Peripheral autonomic neuropathy due to diabetes mellitus ([place]) 03/14/2012   Seizure disorder, complex partial ([place]) 03/07/2012   Type 2 diabetes mellitus with diabetic nephropathy ([place])    Hypothyroidism 03/09/2008   Mixed hyperlipidemia 03/09/2008   Renovascular hypertension 03/09/2008   Peripheral vascular disease ([place]) 03/09/2008   DEGENERATIVE JOINT DISEASE, KNEE 01/26/2008   Osteoarthritis of spine 01/26/2008   Spinal stenosis     Immunization History  Administered Date(s) Administered   Fluad Quad(high Dose  65+) 08/07/2019, 10/07/2020   H1N1 11/14/2008   Influenza Split 10/07/2011, 09/08/2012   Influenza Whole 08/22/2007, 08/27/2010   Influenza,inj,Quad PF,6+ Mos 08/22/2013, 09/26/2014, 09/26/2015, 09/01/2016, 09/20/2017, 09/26/2018   Influenza-Unspecified 09/25/2021   Moderna Sars-Covid-2 Vaccination 01/01/2020, 02/01/2020, 01/05/2021, 06/02/2021   Pneumococcal Conjugate-13 06/12/2014   Pneumococcal Polysaccharide-23 05/21/2004   Td 05/21/2004   Tdap 10/07/2011    Conditions to be addressed/monitored: HTN, HLD, and DMII  Care Plan : Medication Management  Updates made by Beryle Lathe, [place] since 02/04/2022 12:00 AM     Problem: T2DM, HTN, HLD   Priority: High  Onset Date: 10/14/2021     Long-Range Goal: Disease Progression Prevention   Start Date: 10/14/2021  Expected End Date: 01/12/2022  Recent Progress: On track  Priority: High  Note:   Current Barriers:  Unable to independently monitor therapeutic efficacy Unable to achieve control of hypertension and hyperlipidemia  Pharmacist Clinical Goal(s):   Through collaboration with PharmD and provider, patient will:  Achieve adherence to monitoring guidelines and medication adherence to achieve therapeutic efficacy Achieve control of hypertension and hyperlipidemia as  evidenced by improved blood pressure control and improved LDL  Interventions: 1:1 collaboration with [name], MD regarding development and update of comprehensive plan of care as evidenced by provider attestation and co-signature Inter-disciplinary care team collaboration (see longitudinal plan of care) Comprehensive medication review performed; medication list updated in electronic medical record  Type 2 Diabetes - Goal on Track (progressing): YES.: Followed by Dr. Pincus Sanes Controlled; Most recent A1c at goal of <7% per ADA guidelines Current medications: Humulin 70/30 10 units subcutaneously before breakfast and dinner Intolerances: none Taking medications as directed: no, patient continues to only use 10 units if blood glucose >150 (which is most evenings per wife). Endocrinology has been notified with no response. Side effects thought to be attributed to current medication regimen: no Denies hypoglycemic symptoms (sweaty and shaky). Hypoglycemia prevention: not indicated at this time Current meal patterns: breakfast: eggs, sausage, waffles, and oatmeal or sometimes sausage biscuit; lunch: sandwich and cookie; dinner: fish, baked/grilled chicken, pork, and vegetables; snacks: occasional snacks; beverages: water Current exercise: not active On a statin: yes On aspirin 81 mg daily: no, on Plavix Last microalbumin/creatinine ratio: 269.9; on an ACEi/ARB: no, has chronic kidney disease stage 5 Last eye exam: completed within last year Last foot exam:  patient reports he completes at least yearly with Dr. Caprice Beaver Pneumonia vaccine: series complete Current glucose readings:  not discussed today Continue insulin per endocrinology Instructed to monitor blood sugars twice a day at the following times: fasting (at least 8 hours since last food consumption), 5-15 minutes before dinner, and whenever patient experiences symptoms of hypo/hyperglycemia  Given other comorbidities,  may be reasonable to either consider discontinuation of insulin or switch from 70/30 to low dose once daily long acting basal insulin such as [place] or [place].  Had face-to-face discussion regarding continuous glucose monitor and showed them a demo of the [employer] 2. Family plans to discuss further with primary care provider and endocrinology.   Hypertension - Goal on track: NO.: Blood pressure under poor control. Blood pressure is above goal of <130/80 mmHg per 2017 AHA/ACC guidelines. Current medications: amlodipine 10 mg by mouth once daily and hydralazine 25 mg by mouth twice daily Intolerances: none Taking medications as directed: yes Side effects thought to be attributed to current medication regimen: no Current home blood pressure: does not check and does not have a blood pressure machine BP continues  to be elevated, would recommend further titration of hydralazine Encourage dietary sodium restriction/DASH diet Recommend home blood pressure monitoring to discuss at next visit Reviewed risks of hypertension, principles of treatment and consequences of untreated hypertension  Hyperlipidemia/History of stoke/Coronary Artery Disease/Peripheral Vascular Disease- Condition stable. Not addressed this visit.: Uncontrolled. LDL above goal of <55 due to extreme risk given established clinical ASCVD + diabetes per 2023 ADA Standards of Care in Diabetes. Triglycerides at goal of <150 per 2020 AACE/ACE guidelines. Current medications: pravastatin 20 mg by mouth once daily Antiplatelet: clopidogrel 75 mg by mouth daily Intolerances: none Taking medications as directed: yes Side effects thought to be attributed to current medication regimen: no Continue pravastatin 20 mg by mouth once daily If LDL remains elevated upon next check, may be reasonable to consider increasing dose of pravastatin or consider addition of ezetimibe 10 mg by mouth daily for secondary prevention. Encourage dietary  reduction of high fat containing foods such as butter, nuts, bacon, egg yolks, etc. Reviewed risks of hyperlipidemia, principles of treatment and consequences of untreated hyperlipidemia  Patient Goals/Self-Care Activities Patient will:  Take medications as prescribed Check blood sugar twice a day at the following times: fasting (at least 8 hours since last food consumption), 5-15 minutes before dinner, and whenever patient experiences symptoms of hypo/hyperglycemia, document, and provide at future appointments Check blood pressure at least once daily, document, and provide at future appointments Engage in dietary modifications by less frequent dining out, decreased fat intake, and fewer sweetened foods & beverages  Follow Up Plan: Follow-up with endocrinology on 04/06/22       Medication Assistance: None required.  Patient affirms current coverage meets needs.  Patient's preferred pharmacy is:  [facility] [place] [address] [place] [address] Phone: [phone] Fax: [phone]  [id number] [place] [address]: [phone]: [phone]  [id number] [place]: [phone]: [phone] Up:  Patient agrees to Care Plan and Follow-up.  Plan:  Follow-up with endocrinology on 04/06/22  ChKennon HolterPharmD, BCGroomCPWrightlinical Pharmacist Practitioner [facility] [phone]

## 2022-02-04 NOTE — Patient Instructions (Signed)
[name], ? ?It was great to talk to you today! ? ?Please call me with any questions or concerns. ? ?Visit Information ? ?Following are the goals we discussed today:  ? Goals Addressed   ? ?  ?  ?  ?  ? This Visit's Progress  ?  Medication Management     ?  Patient Goals/Self-Care Activities ?Patient will:  ?Take medications as prescribed ?Check blood sugar twice a day at the following times: fasting (at least 8 hours since last food consumption), 5-15 minutes before dinner, and whenever patient experiences symptoms of hypo/hyperglycemia, document, and provide at future appointments ?Check blood pressure at least once daily, document, and provide at future appointments ?Engage in dietary modifications by less frequent dining out, decreased fat intake, and fewer sweetened foods & beverages ? ? ? ?  ? ?  ?  ? ?Follow-up plan:  Follow-up with endocrinology on 04/06/22 ? ?The patient verbalized understanding of instructions, educational materials, and care plan provided today and declined offer to receive copy of patient instructions, educational materials, and care plan.  ? ?Please call the care guide team at [phone] if you need to cancel or reschedule your appointment.  ? ?Kennon Holter, PharmD, BCACP, CPP ?Clinical Pharmacist Practitioner ?[place] ?[phone]  ?

## 2022-02-13 ENCOUNTER — Telehealth: Payer: Self-pay | Admitting: *Deleted

## 2022-02-13 DIAGNOSIS — N184 Chronic kidney disease, stage 4 (severe): Secondary | ICD-10-CM | POA: Diagnosis not present

## 2022-02-13 NOTE — Chronic Care Management (AMB) (Signed)
?  Care Management  ? ?Note ? ?02/13/2022 ?Name: Chase [name] MRN: [id number] DOB: 09-14-1931 ? ?Chase [name] is a 88 y.o. year old male who is a primary care patient of [name], MD and is actively engaged with the care management team. I reached out to [name] by phone today to assist with scheduling an initial visit with the RN Case Manager ? ?Follow up plan: ?Telephone appointment with care management team member scheduled for:02/17/22 ? ?Laverda Sorenson  ?Care Guide, Embedded Care Coordination ?[place]  Care Management  ?Direct Dial: [phone] ? ?

## 2022-02-17 ENCOUNTER — Telehealth: Payer: Medicare Other

## 2022-02-23 ENCOUNTER — Encounter: Payer: Self-pay | Admitting: Family Medicine

## 2022-02-23 NOTE — Progress Notes (Signed)
? ?Chase [name]     MRN: [id number]      DOB: 04-14-1931 ? ? ?HPI ?Accompanied by wife and daughter ?Chase [name] is here for follow up and re-evaluation of chronic medical conditions, medication management and review of any available recent lab and radiology data.  ?Preventive health is updated, specifically  Cancer screening and Immunization.   ?Questions or concerns regarding consultations or procedures which the PT has had in the interim are  addressed. ?The PT denies any adverse reactions to current medications since the last visit.  ? C/o  poor appetite, ( family members) ?Have now decided on freestyle libre and need sooner appointment with Endo to get this set up ?Requests in ome PT to improve mobility ? ?ROS ?Denies recent fever or chills. ?Denies sinus pressure, nasal congestion, ear pain or sore throat. ?Denies chest congestion, productive cough or wheezing. ?Denies chest pains, palpitations and leg swelling ?Denies abdominal pain, nausea, vomiting,diarrhea or constipation.   ?Denies dysuria, frequency, hesitancy or incontinence. ?Chronic joint pain, swelling and limitation in mobility. ?Denies headaches, seizures, numbness, or tingling. ?Denies depression, anxiety or insomnia. ?Denies skin break down or rash. ? ? ?PE ? ?BP (!) 156/78   Pulse 64   Resp 16   Ht '5\' 8"'$  (1.727 m)   SpO2 96%   BMI 25.30 kg/m?  ? ?Patient alert and oriented and in no cardiopulmonary distress. ? ?HEENT: No facial asymmetry, EOMI,     Neck decreased ROM. ? ?Chest: Clear to auscultation bilaterally. ? ?CVS: S1, S2 no murmurs, no S3.Regular rate. ? ?ABD: Soft non tender.  ? ?Ext: No edema ? ?MS: decreased  ROM spine, shoulders, hips and knees. ? ?Skin: Intact, no ulcerations or rash noted. ? ?Psych: Good eye contact, normal affect. not anxious or depressed appearing. ? ?CNS: CN 2-12 intact, power,  normal throughout.no focal deficits noted. ? ? ?Assessment & Plan ? ?Renovascular hypertension ?Uncontrolled at visit, will readdress  at next visit ?DASH diet and commitment to daily physical activity for a minimum of 30 minutes discussed and encouraged, as a part of hypertension management. ?The importance of attaining a healthy weight is also discussed. ? ?BP/Weight 02/04/2022 02/04/2022 10/09/2021 10/06/2021 10/02/2021 09/25/2021 08/22/2021  ?Systolic BP 409 811 914 782 167 153 132  ?Diastolic BP 70 78 65 70 68 69 76  ?Wt. (Lbs) - - - - 166.4 166.4 185  ?BMI - 25.3 - 25.3 25.3 25.3 28.13  ? ? ? ? ? ?Type 2 diabetes mellitus with diabetic nephropathy ([place]) ?Chase [name] is reminded of the importance of commitment to daily physical activity for 30 minutes or more, as able and the need to limit carbohydrate intake to 30 to 60 grams per meal to help with blood sugar control.  ? ?The need to take medication as prescribed, test blood sugar as directed, and to call between visits if there is a concern that blood sugar is uncontrolled is also discussed.  ? ?Chase [name] is reminded of the importance of daily foot exam, annual eye examination, and good blood sugar, blood pressure and cholesterol control. ?High risk of hypoglycemia, managed by Endo, needs sooner appt to change testing system ? ?Diabetic Labs Latest Ref Rng & Units 10/06/2021 05/13/2021 04/03/2021 03/27/2021 10/03/2020  ?HbA1c 0.0 - 7.0 % 5.7 - 5.9 - 6.3(A)  ?Microalbumin Not Estab. ug/mL - - - - -  ?Micro/Creat Ratio 0.0 - 30.0 mg/g creat - - - - -  ?Chol 100 - 199 mg/dL - - - 169 -  ?  HDL >39 mg/dL - - - 81 -  ?Calc LDL 0 - 99 mg/dL - - - 80 -  ?Triglycerides 0 - 149 mg/dL - - - 32 -  ?Creatinine 0.61 - 1.24 mg/dL - 3.25(H) - 3.67(H) -  ? ?BP/Weight 02/04/2022 02/04/2022 10/09/2021 10/06/2021 10/02/2021 09/25/2021 08/22/2021  ?Systolic BP 182 993 716 967 167 153 132  ?Diastolic BP 70 78 65 70 68 69 76  ?Wt. (Lbs) - - - - 166.4 166.4 185  ?BMI - 25.3 - 25.3 25.3 25.3 28.13  ? ?Foot/eye exam completion dates Latest Ref Rng & Units 12/20/2018 02/25/2017  ?Eye Exam No Retinopathy - -  ?Foot exam Order - - -  ?Foot  Form Completion - Done Done  ? ? ? ? ? ? ?Poor balance ?Home safety discussed, refer to in home PT to improve safe ambulation and independence x 4 weeks ? ?Hx of falling ?Significant generalized osteoparthritis involving spine and large joints, PT twice weekly x 4 to 6 weeks ? ?Hypothyroidism ?Managed by Endo, TSH mildly elevatedwhen last checked ? ?

## 2022-02-23 NOTE — Assessment & Plan Note (Signed)
Chase [name] is reminded of the importance of commitment to daily physical activity for 30 minutes or more, as able and the need to limit carbohydrate intake to 30 to 60 grams per meal to help with blood sugar control.  ? ?The need to take medication as prescribed, test blood sugar as directed, and to call between visits if there is a concern that blood sugar is uncontrolled is also discussed.  ? ?Chase [name] is reminded of the importance of daily foot exam, annual eye examination, and good blood sugar, blood pressure and cholesterol control. ?High risk of hypoglycemia, managed by Endo, needs sooner appt to change testing system ? ?Diabetic Labs Latest Ref Rng & Units 10/06/2021 05/13/2021 04/03/2021 03/27/2021 10/03/2020  ?HbA1c 0.0 - 7.0 % 5.7 - 5.9 - 6.3(A)  ?Microalbumin Not Estab. ug/mL - - - - -  ?Micro/Creat Ratio 0.0 - 30.0 mg/g creat - - - - -  ?Chol 100 - 199 mg/dL - - - 169 -  ?HDL >39 mg/dL - - - 81 -  ?Calc LDL 0 - 99 mg/dL - - - 80 -  ?Triglycerides 0 - 149 mg/dL - - - 32 -  ?Creatinine 0.61 - 1.24 mg/dL - 3.25(H) - 3.67(H) -  ? ?BP/Weight 02/04/2022 02/04/2022 10/09/2021 10/06/2021 10/02/2021 09/25/2021 08/22/2021  ?Systolic BP 275 170 017 494 167 153 132  ?Diastolic BP 70 78 65 70 68 69 76  ?Wt. (Lbs) - - - - 166.4 166.4 185  ?BMI - 25.3 - 25.3 25.3 25.3 28.13  ? ?Foot/eye exam completion dates Latest Ref Rng & Units 12/20/2018 02/25/2017  ?Eye Exam No Retinopathy - -  ?Foot exam Order - - -  ?Foot Form Completion - Done Done  ? ? ? ? ? ?

## 2022-02-23 NOTE — Assessment & Plan Note (Signed)
Managed by Endo, TSH mildly elevatedwhen last checked ?

## 2022-02-23 NOTE — Assessment & Plan Note (Signed)
Significant generalized osteoparthritis involving spine and large joints, PT twice weekly x 4 to 6 weeks ?

## 2022-02-23 NOTE — Assessment & Plan Note (Signed)
Uncontrolled at visit, will readdress at next visit ?DASH diet and commitment to daily physical activity for a minimum of 30 minutes discussed and encouraged, as a part of hypertension management. ?The importance of attaining a healthy weight is also discussed. ? ?BP/Weight 02/04/2022 02/04/2022 10/09/2021 10/06/2021 10/02/2021 09/25/2021 08/22/2021  ?Systolic BP 326 712 458 099 167 153 132  ?Diastolic BP 70 78 65 70 68 69 76  ?Wt. (Lbs) - - - - 166.4 166.4 185  ?BMI - 25.3 - 25.3 25.3 25.3 28.13  ? ? ? ? ?

## 2022-02-23 NOTE — Assessment & Plan Note (Signed)
Home safety discussed, refer to in home PT to improve safe ambulation and independence x 4 weeks ?

## 2022-02-24 ENCOUNTER — Telehealth: Payer: Self-pay | Admitting: Nurse Practitioner

## 2022-02-24 NOTE — Telephone Encounter (Signed)
Dr Moshe Cipro has reached out and said the family is comfortable now for him to have the [place]. I spoke with Rayetta Pigg, NP and she said she is sending a RX to [facility]. I called Mrs Gosney to update her. I told her [facility] will reach out to verify insurance and address. I told her once she gets the [place] in the mail to call us so we can schedule a nurse visit. She voiced understanding. ?

## 2022-02-26 DIAGNOSIS — E1142 Type 2 diabetes mellitus with diabetic polyneuropathy: Secondary | ICD-10-CM | POA: Diagnosis not present

## 2022-02-26 DIAGNOSIS — B351 Tinea unguium: Secondary | ICD-10-CM | POA: Diagnosis not present

## 2022-02-27 DIAGNOSIS — N184 Chronic kidney disease, stage 4 (severe): Secondary | ICD-10-CM | POA: Diagnosis not present

## 2022-03-02 ENCOUNTER — Ambulatory Visit: Payer: Medicare Other

## 2022-03-03 ENCOUNTER — Telehealth: Payer: Medicare Other

## 2022-03-03 ENCOUNTER — Other Ambulatory Visit: Payer: Self-pay

## 2022-03-03 ENCOUNTER — Ambulatory Visit: Payer: Medicare Other

## 2022-03-03 DIAGNOSIS — M1712 Unilateral primary osteoarthritis, left knee: Secondary | ICD-10-CM

## 2022-03-04 ENCOUNTER — Telehealth: Payer: Self-pay

## 2022-03-04 ENCOUNTER — Ambulatory Visit: Payer: Self-pay | Admitting: *Deleted

## 2022-03-04 ENCOUNTER — Telehealth: Payer: Medicare Other

## 2022-03-04 ENCOUNTER — Encounter: Payer: Self-pay | Admitting: *Deleted

## 2022-03-04 DIAGNOSIS — R809 Proteinuria, unspecified: Secondary | ICD-10-CM | POA: Diagnosis not present

## 2022-03-04 DIAGNOSIS — I10 Essential (primary) hypertension: Secondary | ICD-10-CM

## 2022-03-04 DIAGNOSIS — Z5989 Other problems related to housing and economic circumstances: Secondary | ICD-10-CM | POA: Diagnosis not present

## 2022-03-04 DIAGNOSIS — Z794 Long term (current) use of insulin: Secondary | ICD-10-CM

## 2022-03-04 DIAGNOSIS — N189 Chronic kidney disease, unspecified: Secondary | ICD-10-CM | POA: Diagnosis not present

## 2022-03-04 DIAGNOSIS — E1121 Type 2 diabetes mellitus with diabetic nephropathy: Secondary | ICD-10-CM

## 2022-03-04 DIAGNOSIS — E1129 Type 2 diabetes mellitus with other diabetic kidney complication: Secondary | ICD-10-CM | POA: Diagnosis not present

## 2022-03-04 DIAGNOSIS — E1122 Type 2 diabetes mellitus with diabetic chronic kidney disease: Secondary | ICD-10-CM | POA: Diagnosis not present

## 2022-03-04 DIAGNOSIS — I120 Hypertensive chronic kidney disease with stage 5 chronic kidney disease or end stage renal disease: Secondary | ICD-10-CM

## 2022-03-04 DIAGNOSIS — I129 Hypertensive chronic kidney disease with stage 1 through stage 4 chronic kidney disease, or unspecified chronic kidney disease: Secondary | ICD-10-CM | POA: Diagnosis not present

## 2022-03-04 DIAGNOSIS — I5032 Chronic diastolic (congestive) heart failure: Secondary | ICD-10-CM | POA: Diagnosis not present

## 2022-03-04 DIAGNOSIS — D631 Anemia in chronic kidney disease: Secondary | ICD-10-CM | POA: Diagnosis not present

## 2022-03-04 NOTE — Patient Instructions (Addendum)
Visit Information  ? ?Thank you for taking time to visit with me today. Please don't hesitate to contact me if I can be of assistance to you before our next scheduled telephone appointment. ? ?Following are the goals we discussed today: ?Take medications as prescribed   ?Attend all scheduled provider appointments ?Call pharmacy for medication refills 3-7 days in advance of running out of medications ?Call provider office for new concerns or questions  ?check blood sugar at prescribed times: twice daily ?check feet daily for cuts, sores or redness ?enter blood sugar readings and medication or insulin into daily log ?take the blood sugar log to all doctor visits ?take the blood sugar meter to all doctor visits ?trim toenails straight across ?drink 6 to 8 glasses of water each day ?fill half of plate with vegetables ?check blood pressure weekly ?choose a place to take my blood pressure (home, clinic or office, retail store) ?write blood pressure results in a log or diary ?learn about high blood pressure ?keep all doctor appointments ?take medications for blood pressure exactly as prescribed ?eat more whole grains, fruits and vegetables, lean meats and healthy fats ?Look over education mailed- low sodium diet, hypoglycemia ?Look over advanced directives packet and complete ?Follow up with primary care provider on wheelchair, request was placed ?Expect a call from care guide about resources for transportation and wheelchair ramp ? ?Our next appointment is by telephone on 04/09/22 at 130 pm ? ?Hypoglycemia ?Hypoglycemia is when the sugar (glucose) level in your blood is too low. Low blood sugar can happen to people who have diabetes and people who do not have diabetes. Low blood sugar can happen quickly, and it can be an emergency. ?What are the causes? ?This condition happens most often in people who have diabetes. It may be caused by: ?Diabetes medicine. ?Not eating enough, or not eating often enough. ?Doing more physical  activity. ?Drinking alcohol on an empty stomach. ?If you do not have diabetes, this condition may be caused by: ?A tumor in the pancreas. ?Not eating enough, or not eating for long periods at a time (fasting). ?A very bad infection or illness. ?Problems after having weight loss (bariatric) surgery. ?Kidney failure or liver failure. ?Certain medicines. ?What increases the risk? ?This condition is more likely to develop in people who: ?Have diabetes and take medicines to lower their blood sugar. ?Abuse alcohol. ?Have a very bad illness. ?What are the signs or symptoms? ?Mild ?Hunger. ?Sweating and feeling clammy. ?Feeling dizzy or light-headed. ?Being sleepy or having trouble sleeping. ?Feeling like you may vomit (nauseous). ?A fast heartbeat. ?A headache. ?Blurry vision. ?Mood changes, such as: ?Being grouchy. ?Feeling worried or nervous (anxious). ?Tingling or loss of feeling (numbness) around your mouth, lips, or tongue. ?Moderate ?Confusion and poor judgment. ?Behavior changes. ?Weakness. ?Uneven heartbeat. ?Trouble with moving (coordination). ?Very low ?Very low blood sugar (severe hypoglycemia) is a medical emergency. It can cause: ?Fainting. ?Seizures. ?Loss of consciousness (coma). ?Death. ?How is this treated? ?Treating low blood sugar ?Low blood sugar is often treated by eating or drinking something that has sugar in it right away. The food or drink should contain 15 grams of a fast-acting carb (carbohydrate). Options include: ?4 oz (120 mL) of fruit juice. ?4 oz (120 Low-Sodium Eating Plan ?Sodium, which is an element that makes up salt, helps you maintain a healthy balance of fluids in your body. Too much sodium can increase your blood pressure and cause fluid and waste to be held in your body. ?Your  health care provider or dietitian may recommend following this plan if you have high blood pressure (hypertension), kidney disease, liver disease, or heart failure. Eating less sodium can help lower your blood  pressure, reduce swelling, and protect your heart, liver, and kidneys. ?What are tips for following this plan? ?Reading food labels ?The Nutrition Facts label lists the amount of sodium in one serving of the food. If you eat more than one serving, you must multiply the listed amount of sodium by the number of servings. ?Choose foods with less than 140 mg of sodium per serving. ?Avoid foods with 300 mg of sodium or more per serving. ?Shopping ? ?Look for lower-sodium products, often labeled as "low-sodium" or "no salt added." ?Always check the sodium content, even if foods are labeled as "unsalted" or "no salt added." ?Buy fresh foods. ?Avoid canned foods and pre-made or frozen meals. ?Avoid canned, cured, or processed meats. ?Buy breads that have less than 80 mg of sodium per slice. ?Cooking ? ?Eat more home-cooked food and less restaurant, buffet, and fast food. ?Avoid adding salt when cooking. Use salt-free seasonings or herbs instead of table salt or sea salt. Check with your health care provider or pharmacist before using salt substitutes. ?Cook with plant-based oils, such as canola, sunflower, or olive oil. ?Meal planning ?When eating at a restaurant, ask that your food be prepared with less salt or no salt, if possible. Avoid dishes labeled as brined, pickled, cured, smoked, or made with soy sauce, miso, or teriyaki sauce. ?Avoid foods that contain MSG (monosodium glutamate). MSG is sometimes added to [place] food, bouillon, and some canned foods. ?Make meals that can be grilled, baked, poached, roasted, or steamed. These are generally made with less sodium. ?General information ?Most people on this plan should limit their sodium intake to 1,500-2,000 mg (milligrams) of sodium each day. ?What foods should I eat? ?Fruits ?Fresh, frozen, or canned fruit. Fruit juice. ?Vegetables ?Fresh or frozen vegetables. "No salt added" canned vegetables. "No salt added" tomato sauce and paste. Low-sodium or reduced-sodium  tomato and vegetable juice. ?Grains ?Low-sodium cereals, including oats, puffed wheat and rice, and shredded wheat. Low-sodium crackers. Unsalted rice. Unsalted pasta. Low-sodium bread. Whole-grain breads and whole-grain pasta. ?Meats and other proteins ?Fresh or frozen (no salt added) meat, poultry, seafood, and fish. Low-sodium canned tuna and salmon. Unsalted nuts. Dried peas, beans, and lentils without added salt. Unsalted canned beans. Eggs. Unsalted nut butters. ?Dairy ?Milk. Soy milk. Cheese that is naturally low in sodium, such as ricotta cheese, fresh mozzarella, or Swiss cheese. Low-sodium or reduced-sodium cheese. Cream cheese. Yogurt. ?Seasonings and condiments ?Fresh and dried herbs and spices. Salt-free seasonings. Low-sodium mustard and ketchup. Sodium-free salad dressing. Sodium-free light mayonnaise. Fresh or refrigerated horseradish. Lemon juice. Vinegar. ?Other foods ?Homemade, reduced-sodium, or low-sodium soups. Unsalted popcorn and pretzels. Low-salt or salt-free chips. ?The items listed above may not be a complete list of foods and beverages you can eat. Contact a dietitian for more information. ?What foods should I avoid? ?Vegetables ?Sauerkraut, pickled vegetables, and relishes. Olives. [place] fries. Onion rings. Regular canned vegetables (not low-sodium or reduced-sodium). Regular canned tomato sauce and paste (not low-sodium or reduced-sodium). Regular tomato and vegetable juice (not low-sodium or reduced-sodium). Frozen vegetables in sauces. ?Grains ?Instant hot cereals. Bread stuffing, pancake, and biscuit mixes. Croutons. Seasoned rice or pasta mixes. Noodle soup cups. Boxed or frozen macaroni and cheese. Regular salted crackers. Self-rising flour. ?Meats and other proteins ?Meat or fish that is salted, canned, smoked, spiced, or  pickled. Precooked or cured meat, such as sausages or meat loaves. Berniece Salines. Ham. Pepperoni. Hot dogs. Corned beef. Chipped beef. Salt pork. Jerky. Pickled  herring. Anchovies and sardines. Regular canned tuna. Salted nuts. ?Dairy ?Processed cheese and cheese spreads. Hard cheeses. Cheese curds. Blue cheese. Feta cheese. String cheese. Regular cottage cheese. B

## 2022-03-04 NOTE — Telephone Encounter (Signed)
Patient is still waiting on a call today appt was at 12:30 ?

## 2022-03-04 NOTE — Chronic Care Management (AMB) (Signed)
?Chronic Care Management  ? ?CCM RN Visit Note ? ?03/04/2022 ?Name: Chase [name] MRN: [id number] DOB: November 25, 1931 ? ?Subjective: ?Chase [name] is a 88 y.o. year old male who is a primary care patient of Moshe Cipro [place] Levo, MD. The care management team was consulted for assistance with disease management and care coordination needs.   ? ?Engaged with patient by telephone for initial visit in response to provider referral for case management and/or care coordination services.  ? ?Consent to Services:  ?The patient was given the following information about Chronic Care Management services today, agreed to services, and gave verbal consent: 1. CCM service includes personalized support from designated clinical staff supervised by the primary care provider, including individualized plan of care and coordination with other care providers 2. 24/7 contact phone numbers for assistance for urgent and routine care needs. 3. Service will only be billed when office clinical staff spend 20 minutes or more in a month to coordinate care. 4. Only one practitioner may furnish and bill the service in a calendar month. 5.The patient may stop CCM services at any time (effective at the end of the month) by phone call to the office staff. 6. The patient will be responsible for cost sharing (co-pay) of up to 20% of the service fee (after annual deductible is met). Patient agreed to services and consent obtained. ? ?Patient agreed to services and verbal consent obtained.  ? ?Assessment: Review of patient past medical history, allergies, medications, health status, including review of consultants reports, laboratory and other test data, was performed as part of comprehensive evaluation and provision of chronic care management services.  ? ?SDOH (Social Determinants of Health) assessments and interventions performed:  ?SDOH Interventions   ? ?Flowsheet Row Most Recent Value  ?SDOH Interventions   ?Food Insecurity Interventions Intervention  Not Indicated  ?Housing Interventions Intervention Not Indicated  ?Transportation Interventions Other (Comment)  [referral to careguide for transportation resources]  ? ?  ?  ? ?[place] ? ?Allergies  ?Allergen Reactions  ? Bayer Advanced Aspirin [Aspirin] Nausea And Vomiting  ? Penicillins Nausea And Vomiting  ?  Has patient had a PCN reaction causing immediate rash, facial/tongue/throat swelling, SOB or lightheadedness with hypotension: unknown ?Has patient had a PCN reaction causing severe rash involving mucus membranes or skin necrosis: unknown ?Has patient had a PCN reaction that required hospitalization: unknown ?Has patient had a PCN reaction occurring within the last 10 years: no ?If all of the above answers are "NO", then may proceed with Cephalosporin use.  ? ? ?Outpatient Encounter Medications as of 03/04/2022  ?Medication Sig Note  ? amLODipine (NORVASC) 10 MG tablet TAKE 1 TABLET DAILY   ? calcium carbonate (TUMS - DOSED IN MG ELEMENTAL CALCIUM) 500 MG chewable tablet Chew 0.5 tablets by mouth 2 (two) times daily.   ? clopidogrel (PLAVIX) 75 MG tablet TAKE 1 TABLET DAILY   ? diphenoxylate-atropine (LOMOTIL) 2.5-0.025 MG tablet Take one tablet by mouth once daily, as needed, for watery, loose stool   ? glucose blood (ONETOUCH ULTRA) test strip USE 1 STRIP THREE TIMES A DAY AS INSTRUCTED.   ? HUMULIN 70/30 KWIKPEN (70-30) 100 UNIT/ML KwikPen INJECT 10 UNITS UNDER THE SKIN TWICE A DAY 10/14/2021: Patient only using if blood glucose >150  ? hydrALAZINE (APRESOLINE) 25 MG tablet Take 25 mg by mouth in the morning and at bedtime.   ? Insulin Pen Needle (B-D ULTRAFINE III SHORT PEN) 31G X 8 MM MISC 1 each by Does  not apply route as directed.   ? levETIRAcetam (KEPPRA) 500 MG tablet Take 250 mg by mouth at bedtime.    ? levothyroxine (SYNTHROID) 112 MCG tablet TAKE 1 TABLET DAILY BEFORE BREAKFAST   ? metroNIDAZOLE (FLAGYL) 500 MG tablet Take 500 mg by mouth 3 (three) times daily.   ? Multiple  Vitamins-Minerals (MULTIVITAMIN WITH MINERALS) tablet Take 1 tablet by mouth daily.   ? olopatadine (PATANOL) 0.1 % ophthalmic solution INSTILL 1 DROP INTO EACH EYE TWICE DAILY   ? OneTouch Delica Lancets 01U MISC 1 each by Other route 2 (two) times daily.   ? polyethylene glycol powder (GLYCOLAX/MIRALAX) 17 GM/SCOOP powder Take 17 g by mouth daily. (Patient taking differently: Take 17 g by mouth daily as needed.)   ? pravastatin (PRAVACHOL) 20 MG tablet TAKE 1 TABLET DAILY   ? sodium bicarbonate 650 MG tablet Take 650 mg by mouth 3 (three) times daily.   ? tamsulosin (FLOMAX) 0.4 MG CAPS capsule TAKE 1 CAPSULE DAILY   ? ciprofloxacin (CIPRO) 500 MG tablet Take 500 mg by mouth 2 (two) times daily. (Patient not taking: Reported on 03/04/2022)   ? ?No facility-administered encounter medications on file as of 03/04/2022.  ? ? ?Patient Active Problem List  ? Diagnosis Date Noted  ? COVID-19 08/08/2021  ? Epilepsy ([place]) 11/07/2020  ? Polyneuropathy due to type 2 diabetes mellitus ([place]) 11/07/2020  ? Atherosclerosis of native artery of left lower extremity with ulceration of ankle ([place]) 08/23/2020  ? Loose stools 10/28/2019  ? Incontinence 07/08/2019  ? Osteoarthritis of left knee 04/15/2019  ? Diarrhea 04/15/2019  ? Common bile duct stone 04/18/2018  ? Cholecystocutaneous fistula   ? Physical deconditioning 02/28/2018  ? Choledocholithiasis 02/25/2018  ? Hyperkalemia 02/24/2018  ? Metabolic bone disease 93/23/5573  ? Fistula of bile duct 02/14/2018  ? Dermatitis 02/25/2017  ? Essential hypertension, benign 12/13/2015  ? RBBB (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 fascicular block)   ? Hypertensive heart disease 12/01/2014  ? Chronic venous insufficiency 11/16/2014  ? Carotid artery disease ([place]) 05/18/2014  ? Poor balance 09/11/2013  ? Difficulty walking 09/11/2013  ? Hx of falling 09/11/2013  ? History of stroke 09/07/2012  ? CKD (chronic kidney disease) stage 4, GFR 15-29 ml/min ([facility]) 09/07/2012  ? Anemia of  chronic disease 09/07/2012  ? Cholelithiasis 08/10/2012  ? Peripheral autonomic neuropathy due to diabetes mellitus ([place]) 03/14/2012  ? Seizure disorder, complex partial ([place]) 03/07/2012  ? Type 2 diabetes mellitus with diabetic nephropathy ([place])   ? Hypothyroidism 03/09/2008  ? Mixed hyperlipidemia 03/09/2008  ? Renovascular hypertension 03/09/2008  ? Peripheral vascular disease ([place]) 03/09/2008  ? DEGENERATIVE JOINT DISEASE, KNEE 01/26/2008  ? Osteoarthritis of spine 01/26/2008  ? Spinal stenosis   ? ? ?Conditions to be addressed/monitored:HTN and DMII ? ?Care Plan : RN Care Manager Plan of Care  ?Updates made by Chase Mends, RN since 03/04/2022 12:00 AM  ?  ? ?Problem: No plan of care established for management of chronic disease state  (DM2, HTN, CKD stage 4)   ?Priority: High  ?  ? ?Long-Range Goal: Development of plan of care for chronic disease management  (DM2, HTN, CKD stage 4)   ?Start Date: 03/04/2022  ?Expected End Date: 08/31/2022  ?Priority: High  ?Note:   ?Current Barriers:  ?Knowledge Deficits related to plan of care for management of HTN and DMII  ?No Advanced Directives in place- family requests information be mailed ?Spoke with spouse Chase [name] and daughter Chase [name] (  permission given by pt), reports pt requires assistance with ADL's and IADL's (such as bathing, mobility, transportation, medication oversight, etc.) Pt is on grant program through [employer] and has aide M-F 8 am- 12 noon. CG reports they would like resources for a new wheelchair ramp and would like pt to have a new, lightweight wheelchair citing patient's wheelchair is old, would also like resources for transportation as it is difficult for family to always be at patient's house for his appointments and they are having increasing difficulty with getting pt out of the house and into the car. ?Spouse checks CBG for pt BID with FBS ranges 74-138, random ranges 200's, pt does not follow a special diet. ?Blood pressure is not  monitored. ? ?RNCM Clinical Goal(s):  ?Patient will verbalize understanding of plan for management of HTN and DMII as evidenced by caregiver report, review of EHR and  through collaboration with RN Care manager, provider, and care team.

## 2022-03-05 ENCOUNTER — Telehealth: Payer: Self-pay

## 2022-03-05 NOTE — Telephone Encounter (Signed)
? ?  Telephone encounter was:  Successful.  ?03/05/2022 ?Name: Chase [name] MRN: [id number] DOB: 02/10/1931 ? ?Chase [name] is a 88 y.o. year old male who is a primary care patient of [name], MD . The community resource team was consulted for assistance with Transportation Needs  and Home Modifications ? ?Care guide performed the following interventions:  Spoke to spouse about transportation and home modifications . Pt does not have transportation through [employer] or [employer] and his [employer] expired. Pt has been set up and scheduled with [facility]. [facility] will pick pt up for his 04/16/2022 appt at 12:15pm. Spouse has been educated on this as well as [employer] contact number. For Home Modifications I will be sending out 6 ?Efficiency Upgrades Programs. Mail enclosed: Housing Improvement Program (HIP), [place] by: [place] [employer] ([place]), Independent Living for People With Disabilities, [employer], [place], [place] [employer] navy officer and [employer] fast streamer, and [employer]. ? ?Follow Up Plan:  Care guide will follow up with patient by phone over the next few days to ensure mail has been received. ? ?Cameron Proud ?Care Guide, Embedded Care Coordination ?[facility]  Care management  ?[place], [place]  ?Main Phone: [phone]  E-mail: [email or web address]  ?Website: [email or web address] ? ? ? ? ? ?

## 2022-03-06 ENCOUNTER — Telehealth: Payer: Self-pay | Admitting: Family Medicine

## 2022-03-06 DIAGNOSIS — I10 Essential (primary) hypertension: Secondary | ICD-10-CM | POA: Diagnosis not present

## 2022-03-06 DIAGNOSIS — E1121 Type 2 diabetes mellitus with diabetic nephropathy: Secondary | ICD-10-CM

## 2022-03-06 DIAGNOSIS — M479 Spondylosis, unspecified: Secondary | ICD-10-CM | POA: Diagnosis not present

## 2022-03-06 DIAGNOSIS — R269 Unspecified abnormalities of gait and mobility: Secondary | ICD-10-CM | POA: Diagnosis not present

## 2022-03-06 DIAGNOSIS — E782 Mixed hyperlipidemia: Secondary | ICD-10-CM | POA: Diagnosis not present

## 2022-03-06 DIAGNOSIS — N185 Chronic kidney disease, stage 5: Secondary | ICD-10-CM

## 2022-03-06 DIAGNOSIS — I150 Renovascular hypertension: Secondary | ICD-10-CM | POA: Diagnosis not present

## 2022-03-06 DIAGNOSIS — E039 Hypothyroidism, unspecified: Secondary | ICD-10-CM | POA: Diagnosis not present

## 2022-03-06 DIAGNOSIS — Z9181 History of falling: Secondary | ICD-10-CM | POA: Diagnosis not present

## 2022-03-06 DIAGNOSIS — I120 Hypertensive chronic kidney disease with stage 5 chronic kidney disease or end stage renal disease: Secondary | ICD-10-CM | POA: Diagnosis not present

## 2022-03-06 DIAGNOSIS — G8929 Other chronic pain: Secondary | ICD-10-CM | POA: Diagnosis not present

## 2022-03-06 DIAGNOSIS — Z8673 Personal history of transient ischemic attack (TIA), and cerebral infarction without residual deficits: Secondary | ICD-10-CM | POA: Diagnosis not present

## 2022-03-06 DIAGNOSIS — Z7902 Long term (current) use of antithrombotics/antiplatelets: Secondary | ICD-10-CM | POA: Diagnosis not present

## 2022-03-06 DIAGNOSIS — Z794 Long term (current) use of insulin: Secondary | ICD-10-CM

## 2022-03-06 DIAGNOSIS — R531 Weakness: Secondary | ICD-10-CM | POA: Diagnosis not present

## 2022-03-06 DIAGNOSIS — M1712 Unilateral primary osteoarthritis, left knee: Secondary | ICD-10-CM | POA: Diagnosis not present

## 2022-03-06 NOTE — Telephone Encounter (Signed)
Physical Therapist with [facility] called in on patient behalf. ? ?Requesting a call back from provider or CMA  ?Has report she wants to give directly on patient  ? ?Call back # [phone] ? ?Could not catch PT name  ?

## 2022-03-10 ENCOUNTER — Other Ambulatory Visit: Payer: Self-pay | Admitting: Family Medicine

## 2022-03-11 NOTE — Telephone Encounter (Signed)
LMTRC

## 2022-03-12 ENCOUNTER — Encounter: Payer: Self-pay | Admitting: Family Medicine

## 2022-03-12 ENCOUNTER — Ambulatory Visit (INDEPENDENT_AMBULATORY_CARE_PROVIDER_SITE_OTHER): Payer: Medicare Other | Admitting: Family Medicine

## 2022-03-12 ENCOUNTER — Telehealth: Payer: Self-pay

## 2022-03-12 DIAGNOSIS — R4689 Other symptoms and signs involving appearance and behavior: Secondary | ICD-10-CM

## 2022-03-12 DIAGNOSIS — E1121 Type 2 diabetes mellitus with diabetic nephropathy: Secondary | ICD-10-CM | POA: Diagnosis not present

## 2022-03-12 DIAGNOSIS — R630 Anorexia: Secondary | ICD-10-CM | POA: Diagnosis not present

## 2022-03-12 DIAGNOSIS — Z794 Long term (current) use of insulin: Secondary | ICD-10-CM | POA: Diagnosis not present

## 2022-03-12 DIAGNOSIS — F22 Delusional disorders: Secondary | ICD-10-CM

## 2022-03-12 NOTE — Patient Instructions (Addendum)
F/U as before, call if you needme sooner ? ?You will be referred for brain scan , lab tests will be ordered and you are being referred to Dr Merlene Laughter re  change in behavior and possible dementia ?Needs HBa1C, TSH, vit D , B12, RPR ? ?If your conditions worsens and there is real or perceived risk  to yourself or your family, then you need o go to the ED' ? ? ?I have discussed all of this with your wife and 3 children during the phone visit today ? ?Please consistently eat more regularly and increased food portions ?

## 2022-03-12 NOTE — Progress Notes (Signed)
Virtual Visit via Telephone Note ? ?I connected with Chase [name] , his wife and his 3 children on 03/12/22 at  4:40 PM EDT by telephone and verified that I am speaking with the correct person using two identifiers.Location: ?Patient: home ?Provider: office ?  ?I discussed the limitations, risks, security and privacy concerns of performing an evaluation and management service by telephone and the availability of in person appointments. I also discussed with the patient that there may be a patient responsible charge related to this service. The patient expressed understanding and agreed to proceed. ? ? ?History of Present Illness: ?2 week h/o poor appetite, will not eat cannot be encouraged to eat gets, readily agitated, gets angry and hits at family due to excessive agitation. ?Wife  says states getting weaker ?Recent grandiose speech of having and giving away millions of dollars, he was put on the phone, he told me if I guessed what he had recently purchased that he would give me $1000,000 ?  ?Observations/Objective: ?There were no vitals taken for this visit. ? ? ?Assessment and Plan: ?Delusional disorder ([place]) ?New onset thoughts that he has millions of dollars to give away as he feels are being expressed in past 2 weeks ? ?Aggressive behavior ?New report of pt attempting to hit family members when he is being encouraged to eat, I recommend ED eval if situation worsens and there is real or perceived threat of safety both for pt and for family ? ?Poor appetite ?Family reports reduced intake felt to be due to worsening dementia refer Neurology for eval ? ?Type 2 diabetes mellitus with diabetic nephropathy ([place]) ?Managed by endo ?[name] is reminded of the importance of commitment to daily physical activity for 30 minutes or more, as able and the need to limit carbohydrate intake to 30 to 60 grams per meal to help with blood sugar control.  ? ?The need to take medication as prescribed, test blood sugar as  directed, and to call between visits if there is a concern that blood sugar is uncontrolled is also discussed.  ? ?[name] is reminded of the importance of daily foot exam, annual eye examination, and good blood sugar, blood pressure and cholesterol control. ?Updated lab needed at/ before next visit. ?Appears over corrected ? ? ?  Latest Ref Rng & Units 10/06/2021  ?  1:21 PM 05/13/2021  ?  1:30 PM 04/03/2021  ?  1:25 PM 03/27/2021  ?  9:58 AM 10/03/2020  ?  1:27 PM  ?Diabetic Labs  ?HbA1c 0.0 - 7.0 % 5.7    5.9    6.3    ?Chol 100 - 199 mg/dL    169     ?HDL >39 mg/dL    81     ?Calc LDL 0 - 99 mg/dL    80     ?Triglycerides 0 - 149 mg/dL    32     ?Creatinine 0.61 - 1.24 mg/dL  3.25    3.67     ? ? ?  02/04/2022  ?  3:57 PM 02/04/2022  ?  3:19 PM 10/09/2021  ? 10:02 AM 10/06/2021  ?  1:03 PM 10/02/2021  ?  9:53 AM 09/25/2021  ?  3:09 PM 08/22/2021  ? 11:17 AM  ?BP/Weight  ?Systolic BP 154 008 676 195 167 153 132  ?Diastolic BP 70 78 65 70 68 69 76  ?Wt. (Lbs)     166.4 166.4 185  ?BMI     25.3 kg/m2  25.3 kg/m2 28.13 kg/m2  ? ? ?  12/20/2018  ?  1:40 PM 02/25/2017  ?  2:40 PM  ?Foot/eye exam completion dates  ?Foot Form Completion Done Done  ? ? ? ? ? ? ? ?Follow Up Instructions: ? ?  ?I discussed the assessment and treatment plan with the patient. The patient was provided an opportunity to ask questions and all were answered. The patient agreed with the plan and demonstrated an understanding of the instructions. ?  ?The patient was advised to call back or seek an in-person evaluation if the symptoms worsen or if the condition fails to improve as anticipated. ? ?I provided 24 minutes of non-face-to-face time during this encounter. ? ? ?Tula Nakayama, MD ? ?

## 2022-03-12 NOTE — Telephone Encounter (Signed)
? ?  Telephone encounter was:  Unsuccessful.  03/12/2022 ?Name: Chase [name] MRN: [id number] DOB: 06/19/1931 ? ?Unsuccessful outbound call made today to assist with:  Transportation Needs  and Home Modifications ? ?Outreach Attempt:  1st Attempt Follow up ? ?A HIPAA compliant voice message was left requesting a return call.  Instructed patient to call back at [phone] at their earliest convenience. ? ?Cameron Proud ?Care Guide, Embedded Care Coordination ?[facility]  Care management  ?[place], [place]  ?Main Phone: [phone]  E-mail: [email or web address]  ?Website: [email or web address] ? ? ? ?

## 2022-03-13 ENCOUNTER — Telehealth: Payer: Self-pay

## 2022-03-13 NOTE — Telephone Encounter (Signed)
? ?  Telephone encounter was:  Unsuccessful.  03/13/2022 ?Name: Chase [name] MRN: [id number] DOB: 02/24/1931 ? ?Unsuccessful outbound call made today to assist with:  Transportation Needs  and Home Modifications ? ?Outreach Attempt:  2nd Attempt ? ?A HIPAA compliant voice message was left requesting a return call.  Instructed patient to call back at [phone] at their earliest convenience. ? ?Left a voicemail on home and cell phone. ? ?Cameron Proud ?Care Guide, Embedded Care Coordination ?[facility]  Care management  ?[place], [place]  ?Main Phone: [phone]  E-mail: [email or web address]  ?Website: [email or web address] ? ? ?

## 2022-03-15 DIAGNOSIS — Z794 Long term (current) use of insulin: Secondary | ICD-10-CM | POA: Diagnosis not present

## 2022-03-15 DIAGNOSIS — R109 Unspecified abdominal pain: Secondary | ICD-10-CM | POA: Diagnosis not present

## 2022-03-15 DIAGNOSIS — I700 Atherosclerosis of aorta: Secondary | ICD-10-CM | POA: Diagnosis not present

## 2022-03-15 DIAGNOSIS — E785 Hyperlipidemia, unspecified: Secondary | ICD-10-CM | POA: Diagnosis not present

## 2022-03-15 DIAGNOSIS — R531 Weakness: Secondary | ICD-10-CM | POA: Diagnosis not present

## 2022-03-15 DIAGNOSIS — E039 Hypothyroidism, unspecified: Secondary | ICD-10-CM | POA: Diagnosis not present

## 2022-03-15 DIAGNOSIS — R790 Abnormal level of blood mineral: Secondary | ICD-10-CM | POA: Diagnosis not present

## 2022-03-15 DIAGNOSIS — K219 Gastro-esophageal reflux disease without esophagitis: Secondary | ICD-10-CM | POA: Diagnosis not present

## 2022-03-15 DIAGNOSIS — R4701 Aphasia: Secondary | ICD-10-CM | POA: Diagnosis not present

## 2022-03-15 DIAGNOSIS — R4182 Altered mental status, unspecified: Secondary | ICD-10-CM | POA: Diagnosis not present

## 2022-03-15 DIAGNOSIS — Z880 Allergy status to penicillin: Secondary | ICD-10-CM | POA: Diagnosis not present

## 2022-03-15 DIAGNOSIS — N289 Disorder of kidney and ureter, unspecified: Secondary | ICD-10-CM | POA: Diagnosis not present

## 2022-03-15 DIAGNOSIS — G459 Transient cerebral ischemic attack, unspecified: Secondary | ICD-10-CM | POA: Diagnosis not present

## 2022-03-15 DIAGNOSIS — I10 Essential (primary) hypertension: Secondary | ICD-10-CM | POA: Diagnosis not present

## 2022-03-15 DIAGNOSIS — Z886 Allergy status to analgesic agent status: Secondary | ICD-10-CM | POA: Diagnosis not present

## 2022-03-15 DIAGNOSIS — R059 Cough, unspecified: Secondary | ICD-10-CM | POA: Diagnosis not present

## 2022-03-15 DIAGNOSIS — R7989 Other specified abnormal findings of blood chemistry: Secondary | ICD-10-CM | POA: Diagnosis not present

## 2022-03-15 DIAGNOSIS — E119 Type 2 diabetes mellitus without complications: Secondary | ICD-10-CM | POA: Diagnosis not present

## 2022-03-15 DIAGNOSIS — Z79899 Other long term (current) drug therapy: Secondary | ICD-10-CM | POA: Diagnosis not present

## 2022-03-15 DIAGNOSIS — G319 Degenerative disease of nervous system, unspecified: Secondary | ICD-10-CM | POA: Diagnosis not present

## 2022-03-16 ENCOUNTER — Encounter: Payer: Self-pay | Admitting: Family Medicine

## 2022-03-16 ENCOUNTER — Telehealth: Payer: Self-pay

## 2022-03-16 DIAGNOSIS — F22 Delusional disorders: Secondary | ICD-10-CM | POA: Insufficient documentation

## 2022-03-16 DIAGNOSIS — R630 Anorexia: Secondary | ICD-10-CM | POA: Insufficient documentation

## 2022-03-16 DIAGNOSIS — R4689 Other symptoms and signs involving appearance and behavior: Secondary | ICD-10-CM | POA: Insufficient documentation

## 2022-03-16 NOTE — Assessment & Plan Note (Signed)
New onset thoughts that he has millions of dollars to give away as he feels are being expressed in past 2 weeks ?

## 2022-03-16 NOTE — Assessment & Plan Note (Signed)
New report of pt attempting to hit family members when he is being encouraged to eat, I recommend ED eval if situation worsens and there is real or perceived threat of safety both for pt and for family ?

## 2022-03-16 NOTE — Assessment & Plan Note (Signed)
Family reports reduced intake felt to be due to worsening dementia refer Neurology for eval ?

## 2022-03-16 NOTE — Assessment & Plan Note (Addendum)
Managed by endo ?Chase [name] is reminded of the importance of commitment to daily physical activity for 30 minutes or more, as able and the need to limit carbohydrate intake to 30 to 60 grams per meal to help with blood sugar control.  ? ?The need to take medication as prescribed, test blood sugar as directed, and to call between visits if there is a concern that blood sugar is uncontrolled is also discussed.  ? ?Chase [name] is reminded of the importance of daily foot exam, annual eye examination, and good blood sugar, blood pressure and cholesterol control. ?Updated lab needed at/ before next visit. ?Appears over corrected ? ? ?  Latest Ref Rng & Units 10/06/2021  ?  1:21 PM 05/13/2021  ?  1:30 PM 04/03/2021  ?  1:25 PM 03/27/2021  ?  9:58 AM 10/03/2020  ?  1:27 PM  ?Diabetic Labs  ?HbA1c 0.0 - 7.0 % 5.7    5.9    6.3    ?Chol 100 - 199 mg/dL    169     ?HDL >39 mg/dL    81     ?Calc LDL 0 - 99 mg/dL    80     ?Triglycerides 0 - 149 mg/dL    32     ?Creatinine 0.61 - 1.24 mg/dL  3.25    3.67     ? ? ?  02/04/2022  ?  3:57 PM 02/04/2022  ?  3:19 PM 10/09/2021  ? 10:02 AM 10/06/2021  ?  1:03 PM 10/02/2021  ?  9:53 AM 09/25/2021  ?  3:09 PM 08/22/2021  ? 11:17 AM  ?BP/Weight  ?Systolic BP 110 315 945 859 167 153 132  ?Diastolic BP 70 78 65 70 68 69 76  ?Wt. (Lbs)     166.4 166.4 185  ?BMI     25.3 kg/m2 25.3 kg/m2 28.13 kg/m2  ? ? ?  12/20/2018  ?  1:40 PM 02/25/2017  ?  2:40 PM  ?Foot/eye exam completion dates  ?Foot Form Completion Done Done  ? ? ? ? ? ?

## 2022-03-16 NOTE — Telephone Encounter (Signed)
? ?  Telephone encounter was:  Successful.  ?03/16/2022 ?Name: Chase [name] MRN: [id number] DOB: 1931-06-06 ? ?Chase [name] is a 88 y.o. year old male who is a primary care patient of [name], MD . The community resource team was consulted for assistance with Transportation Needs  and Home Modifications ? ?Care guide performed the following interventions:  Spouse answered and she advised they did receive the mail and at this time, she does not have the documents in front of her to ask any questions. She has my number and will call if and when any questions come up. ? ?Follow Up Plan:  No further follow up planned at this time. The patient has been provided with needed resources. ? ?Cameron Proud ?Care Guide, Embedded Care Coordination ?[facility]  Care management  ?[place], [place]  ?Main Phone: [phone]  E-mail: [email or web address]  ?Website: [email or web address] ? ? ? ? ?

## 2022-03-18 ENCOUNTER — Telehealth: Payer: Self-pay

## 2022-03-18 DIAGNOSIS — M479 Spondylosis, unspecified: Secondary | ICD-10-CM | POA: Diagnosis not present

## 2022-03-18 DIAGNOSIS — R269 Unspecified abnormalities of gait and mobility: Secondary | ICD-10-CM | POA: Diagnosis not present

## 2022-03-18 DIAGNOSIS — I150 Renovascular hypertension: Secondary | ICD-10-CM | POA: Diagnosis not present

## 2022-03-18 DIAGNOSIS — R531 Weakness: Secondary | ICD-10-CM | POA: Diagnosis not present

## 2022-03-18 DIAGNOSIS — M1712 Unilateral primary osteoarthritis, left knee: Secondary | ICD-10-CM | POA: Diagnosis not present

## 2022-03-18 DIAGNOSIS — G8929 Other chronic pain: Secondary | ICD-10-CM | POA: Diagnosis not present

## 2022-03-18 NOTE — Telephone Encounter (Signed)
Patient spouse called saying libre for the arm that checks blood sugar has stop working, can not get it to work.  What to do next?  Call back # [phone]. ?

## 2022-03-18 NOTE — Telephone Encounter (Signed)
Patient's wife aware to call the 800 number on the package for assistance. ?

## 2022-03-19 ENCOUNTER — Telehealth: Payer: Medicare Other | Admitting: Family Medicine

## 2022-03-23 ENCOUNTER — Other Ambulatory Visit: Payer: Self-pay | Admitting: Family Medicine

## 2022-03-23 DIAGNOSIS — R4182 Altered mental status, unspecified: Secondary | ICD-10-CM

## 2022-03-24 DIAGNOSIS — M1712 Unilateral primary osteoarthritis, left knee: Secondary | ICD-10-CM | POA: Diagnosis not present

## 2022-03-24 DIAGNOSIS — G8929 Other chronic pain: Secondary | ICD-10-CM | POA: Diagnosis not present

## 2022-03-24 DIAGNOSIS — R269 Unspecified abnormalities of gait and mobility: Secondary | ICD-10-CM | POA: Diagnosis not present

## 2022-03-24 DIAGNOSIS — I150 Renovascular hypertension: Secondary | ICD-10-CM | POA: Diagnosis not present

## 2022-03-24 DIAGNOSIS — R531 Weakness: Secondary | ICD-10-CM | POA: Diagnosis not present

## 2022-03-24 DIAGNOSIS — M479 Spondylosis, unspecified: Secondary | ICD-10-CM | POA: Diagnosis not present

## 2022-03-25 DIAGNOSIS — E1122 Type 2 diabetes mellitus with diabetic chronic kidney disease: Secondary | ICD-10-CM | POA: Diagnosis not present

## 2022-03-25 DIAGNOSIS — M130 Polyarthritis, unspecified: Secondary | ICD-10-CM | POA: Diagnosis not present

## 2022-03-25 DIAGNOSIS — F01518 Vascular dementia, unspecified severity, with other behavioral disturbance: Secondary | ICD-10-CM | POA: Diagnosis not present

## 2022-03-25 DIAGNOSIS — N189 Chronic kidney disease, unspecified: Secondary | ICD-10-CM | POA: Diagnosis not present

## 2022-03-25 DIAGNOSIS — G40211 Localization-related (focal) (partial) symptomatic epilepsy and epileptic syndromes with complex partial seizures, intractable, with status epilepticus: Secondary | ICD-10-CM | POA: Diagnosis not present

## 2022-03-25 DIAGNOSIS — E1129 Type 2 diabetes mellitus with other diabetic kidney complication: Secondary | ICD-10-CM | POA: Diagnosis not present

## 2022-03-25 DIAGNOSIS — D631 Anemia in chronic kidney disease: Secondary | ICD-10-CM | POA: Diagnosis not present

## 2022-03-25 DIAGNOSIS — G40909 Epilepsy, unspecified, not intractable, without status epilepticus: Secondary | ICD-10-CM | POA: Diagnosis not present

## 2022-03-25 DIAGNOSIS — I5032 Chronic diastolic (congestive) heart failure: Secondary | ICD-10-CM | POA: Diagnosis not present

## 2022-03-25 DIAGNOSIS — E1142 Type 2 diabetes mellitus with diabetic polyneuropathy: Secondary | ICD-10-CM | POA: Diagnosis not present

## 2022-03-25 DIAGNOSIS — R809 Proteinuria, unspecified: Secondary | ICD-10-CM | POA: Diagnosis not present

## 2022-03-25 DIAGNOSIS — Z79899 Other long term (current) drug therapy: Secondary | ICD-10-CM | POA: Diagnosis not present

## 2022-03-25 DIAGNOSIS — R2689 Other abnormalities of gait and mobility: Secondary | ICD-10-CM | POA: Diagnosis not present

## 2022-03-25 DIAGNOSIS — G4714 Hypersomnia due to medical condition: Secondary | ICD-10-CM | POA: Diagnosis not present

## 2022-03-25 DIAGNOSIS — I129 Hypertensive chronic kidney disease with stage 1 through stage 4 chronic kidney disease, or unspecified chronic kidney disease: Secondary | ICD-10-CM | POA: Diagnosis not present

## 2022-03-27 DIAGNOSIS — M1712 Unilateral primary osteoarthritis, left knee: Secondary | ICD-10-CM | POA: Diagnosis not present

## 2022-03-27 DIAGNOSIS — I150 Renovascular hypertension: Secondary | ICD-10-CM | POA: Diagnosis not present

## 2022-03-27 DIAGNOSIS — M479 Spondylosis, unspecified: Secondary | ICD-10-CM | POA: Diagnosis not present

## 2022-03-27 DIAGNOSIS — G8929 Other chronic pain: Secondary | ICD-10-CM | POA: Diagnosis not present

## 2022-03-27 DIAGNOSIS — R269 Unspecified abnormalities of gait and mobility: Secondary | ICD-10-CM | POA: Diagnosis not present

## 2022-03-27 DIAGNOSIS — R531 Weakness: Secondary | ICD-10-CM | POA: Diagnosis not present

## 2022-03-27 DIAGNOSIS — N184 Chronic kidney disease, stage 4 (severe): Secondary | ICD-10-CM | POA: Diagnosis not present

## 2022-03-30 ENCOUNTER — Telehealth: Payer: Self-pay

## 2022-03-30 NOTE — Telephone Encounter (Signed)
FYI:  Patient spouse called said his wheelchair arrived today. ?

## 2022-03-31 DIAGNOSIS — E039 Hypothyroidism, unspecified: Secondary | ICD-10-CM | POA: Diagnosis not present

## 2022-03-31 DIAGNOSIS — I150 Renovascular hypertension: Secondary | ICD-10-CM | POA: Diagnosis not present

## 2022-03-31 DIAGNOSIS — M1712 Unilateral primary osteoarthritis, left knee: Secondary | ICD-10-CM | POA: Diagnosis not present

## 2022-03-31 DIAGNOSIS — R531 Weakness: Secondary | ICD-10-CM | POA: Diagnosis not present

## 2022-03-31 DIAGNOSIS — Z7902 Long term (current) use of antithrombotics/antiplatelets: Secondary | ICD-10-CM | POA: Diagnosis not present

## 2022-03-31 DIAGNOSIS — G8929 Other chronic pain: Secondary | ICD-10-CM | POA: Diagnosis not present

## 2022-03-31 DIAGNOSIS — R269 Unspecified abnormalities of gait and mobility: Secondary | ICD-10-CM | POA: Diagnosis not present

## 2022-03-31 DIAGNOSIS — Z8673 Personal history of transient ischemic attack (TIA), and cerebral infarction without residual deficits: Secondary | ICD-10-CM | POA: Diagnosis not present

## 2022-03-31 DIAGNOSIS — Z9181 History of falling: Secondary | ICD-10-CM | POA: Diagnosis not present

## 2022-03-31 DIAGNOSIS — M479 Spondylosis, unspecified: Secondary | ICD-10-CM | POA: Diagnosis not present

## 2022-03-31 DIAGNOSIS — E1121 Type 2 diabetes mellitus with diabetic nephropathy: Secondary | ICD-10-CM | POA: Diagnosis not present

## 2022-03-31 DIAGNOSIS — Z794 Long term (current) use of insulin: Secondary | ICD-10-CM | POA: Diagnosis not present

## 2022-04-01 DIAGNOSIS — M479 Spondylosis, unspecified: Secondary | ICD-10-CM | POA: Diagnosis not present

## 2022-04-01 DIAGNOSIS — M1712 Unilateral primary osteoarthritis, left knee: Secondary | ICD-10-CM | POA: Diagnosis not present

## 2022-04-01 DIAGNOSIS — I150 Renovascular hypertension: Secondary | ICD-10-CM | POA: Diagnosis not present

## 2022-04-01 DIAGNOSIS — G8929 Other chronic pain: Secondary | ICD-10-CM | POA: Diagnosis not present

## 2022-04-01 DIAGNOSIS — R269 Unspecified abnormalities of gait and mobility: Secondary | ICD-10-CM | POA: Diagnosis not present

## 2022-04-01 DIAGNOSIS — R531 Weakness: Secondary | ICD-10-CM | POA: Diagnosis not present

## 2022-04-03 DIAGNOSIS — G8929 Other chronic pain: Secondary | ICD-10-CM | POA: Diagnosis not present

## 2022-04-03 DIAGNOSIS — I150 Renovascular hypertension: Secondary | ICD-10-CM | POA: Diagnosis not present

## 2022-04-03 DIAGNOSIS — R531 Weakness: Secondary | ICD-10-CM | POA: Diagnosis not present

## 2022-04-03 DIAGNOSIS — R269 Unspecified abnormalities of gait and mobility: Secondary | ICD-10-CM | POA: Diagnosis not present

## 2022-04-03 DIAGNOSIS — M479 Spondylosis, unspecified: Secondary | ICD-10-CM | POA: Diagnosis not present

## 2022-04-03 DIAGNOSIS — M1712 Unilateral primary osteoarthritis, left knee: Secondary | ICD-10-CM | POA: Diagnosis not present

## 2022-04-05 DIAGNOSIS — R531 Weakness: Secondary | ICD-10-CM | POA: Diagnosis not present

## 2022-04-05 DIAGNOSIS — E039 Hypothyroidism, unspecified: Secondary | ICD-10-CM | POA: Diagnosis not present

## 2022-04-05 DIAGNOSIS — G8929 Other chronic pain: Secondary | ICD-10-CM | POA: Diagnosis not present

## 2022-04-05 DIAGNOSIS — M479 Spondylosis, unspecified: Secondary | ICD-10-CM | POA: Diagnosis not present

## 2022-04-05 DIAGNOSIS — Z794 Long term (current) use of insulin: Secondary | ICD-10-CM | POA: Diagnosis not present

## 2022-04-05 DIAGNOSIS — R269 Unspecified abnormalities of gait and mobility: Secondary | ICD-10-CM | POA: Diagnosis not present

## 2022-04-05 DIAGNOSIS — Z9181 History of falling: Secondary | ICD-10-CM | POA: Diagnosis not present

## 2022-04-05 DIAGNOSIS — Z8673 Personal history of transient ischemic attack (TIA), and cerebral infarction without residual deficits: Secondary | ICD-10-CM | POA: Diagnosis not present

## 2022-04-05 DIAGNOSIS — Z7902 Long term (current) use of antithrombotics/antiplatelets: Secondary | ICD-10-CM | POA: Diagnosis not present

## 2022-04-05 DIAGNOSIS — M1712 Unilateral primary osteoarthritis, left knee: Secondary | ICD-10-CM | POA: Diagnosis not present

## 2022-04-05 DIAGNOSIS — E1121 Type 2 diabetes mellitus with diabetic nephropathy: Secondary | ICD-10-CM | POA: Diagnosis not present

## 2022-04-05 DIAGNOSIS — I150 Renovascular hypertension: Secondary | ICD-10-CM | POA: Diagnosis not present

## 2022-04-06 ENCOUNTER — Telehealth: Payer: Self-pay

## 2022-04-06 ENCOUNTER — Ambulatory Visit: Payer: Medicare Other | Admitting: Nurse Practitioner

## 2022-04-06 DIAGNOSIS — E038 Other specified hypothyroidism: Secondary | ICD-10-CM | POA: Diagnosis not present

## 2022-04-06 NOTE — Telephone Encounter (Signed)
I sent a msg to Santiago Glad of [employer] to see if he was currently getting home health and through what company. 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 Will wait to hear back from Santiago Glad  ?

## 2022-04-06 NOTE — Telephone Encounter (Signed)
Colon Flattery had to go out and get him out of the car, was unable to come into the office.  ? ?Sent message to Dr Dorris Fetch office 1:46 PM ? ?We had a patient came to our office for blood work MRN [id number], this patient is not physically able to come into the office per [facility] ? ?Last read ?Unfortunately Whitney can not see him witho... by Ahmed Prima ?Lovena Le, Brittany1:48 PM ? ?Unfortunately Whitney can not see him without the labs. I asked her when they came. They were going to bring him in. Dr Moshe Cipro is who sent [place] a message for him to be seen urgently but he has to have the labs  ? ?I would maybe talk with Dr Moshe Cipro by Ahmed Prima ?Ahmed Prima ?1:48 PM ? ?I would maybe talk with Dr Moshe Cipro ? ? ?

## 2022-04-07 ENCOUNTER — Other Ambulatory Visit: Payer: Self-pay | Admitting: Family Medicine

## 2022-04-07 ENCOUNTER — Ambulatory Visit (HOSPITAL_COMMUNITY): Admission: RE | Admit: 2022-04-07 | Payer: Medicare Other | Source: Ambulatory Visit

## 2022-04-07 DIAGNOSIS — Z8673 Personal history of transient ischemic attack (TIA), and cerebral infarction without residual deficits: Secondary | ICD-10-CM

## 2022-04-07 DIAGNOSIS — E1121 Type 2 diabetes mellitus with diabetic nephropathy: Secondary | ICD-10-CM

## 2022-04-07 DIAGNOSIS — M1712 Unilateral primary osteoarthritis, left knee: Secondary | ICD-10-CM

## 2022-04-07 DIAGNOSIS — Z9181 History of falling: Secondary | ICD-10-CM

## 2022-04-07 DIAGNOSIS — Z794 Long term (current) use of insulin: Secondary | ICD-10-CM

## 2022-04-07 DIAGNOSIS — Z7902 Long term (current) use of antithrombotics/antiplatelets: Secondary | ICD-10-CM

## 2022-04-07 DIAGNOSIS — G8929 Other chronic pain: Secondary | ICD-10-CM

## 2022-04-07 DIAGNOSIS — R269 Unspecified abnormalities of gait and mobility: Secondary | ICD-10-CM

## 2022-04-07 DIAGNOSIS — I150 Renovascular hypertension: Secondary | ICD-10-CM

## 2022-04-07 DIAGNOSIS — M479 Spondylosis, unspecified: Secondary | ICD-10-CM

## 2022-04-07 DIAGNOSIS — E559 Vitamin D deficiency, unspecified: Secondary | ICD-10-CM

## 2022-04-07 DIAGNOSIS — E039 Hypothyroidism, unspecified: Secondary | ICD-10-CM

## 2022-04-07 DIAGNOSIS — R531 Weakness: Secondary | ICD-10-CM

## 2022-04-07 DIAGNOSIS — R4182 Altered mental status, unspecified: Secondary | ICD-10-CM

## 2022-04-07 LAB — TSH: TSH: 1.43 u[IU]/mL (ref 0.450–4.500)

## 2022-04-07 LAB — T4, FREE: Free T4: 1.47 ng/dL (ref 0.82–1.77)

## 2022-04-07 NOTE — Telephone Encounter (Signed)
Sent labs to [facility] and nurse will have them drawn  ? ?

## 2022-04-08 ENCOUNTER — Ambulatory Visit (INDEPENDENT_AMBULATORY_CARE_PROVIDER_SITE_OTHER): Payer: Medicare Other | Admitting: Nurse Practitioner

## 2022-04-08 ENCOUNTER — Encounter: Payer: Self-pay | Admitting: Nurse Practitioner

## 2022-04-08 VITALS — BP 140/55 | HR 65 | Ht 68.0 in

## 2022-04-08 DIAGNOSIS — N184 Chronic kidney disease, stage 4 (severe): Secondary | ICD-10-CM | POA: Diagnosis not present

## 2022-04-08 DIAGNOSIS — E1122 Type 2 diabetes mellitus with diabetic chronic kidney disease: Secondary | ICD-10-CM | POA: Diagnosis not present

## 2022-04-08 DIAGNOSIS — I10 Essential (primary) hypertension: Secondary | ICD-10-CM | POA: Diagnosis not present

## 2022-04-08 DIAGNOSIS — E782 Mixed hyperlipidemia: Secondary | ICD-10-CM

## 2022-04-08 DIAGNOSIS — E038 Other specified hypothyroidism: Secondary | ICD-10-CM | POA: Diagnosis not present

## 2022-04-08 DIAGNOSIS — Z794 Long term (current) use of insulin: Secondary | ICD-10-CM | POA: Diagnosis not present

## 2022-04-08 LAB — POCT GLYCOSYLATED HEMOGLOBIN (HGB A1C): HbA1c, POC (controlled diabetic range): 5.8 % (ref 0.0–7.0)

## 2022-04-08 NOTE — Patient Instructions (Signed)

## 2022-04-08 NOTE — Progress Notes (Signed)
? ? ?04/08/2022 ? ?         ?Endocrinology follow-up note ? ?  ?Subjective:  ? ? Patient ID: Chase [name], male    DOB: [birth year], PCP [name], MD ? ? ?Past Medical History:  ?Diagnosis Date  ? Bradycardia 03/15/2012  ? Carotid artery occlusion   ? Choledocholithiasis 02/25/2018  ? CKD (chronic kidney disease) stage 4, GFR 15-29 ml/min (HCC)   ? Complete lesion of cervical spinal cord ([place]) 03/14/3012  ? Stable since 2006  ? CVA (cerebrovascular accident) ([place]) 09/07/12  ? right sided weakness  ? Depressive disorder, not elsewhere classified   ? Diabetes mellitus approx 1994  ? Diabetic neuropathy ([place])   ? History of kidney stones   ? Hypertensive heart disease   ? Hypothyroidism approx 2000  ? Lacunar stroke, acute ([place]) 03/14/2012  ? Obesity   ? Osteoarthrosis, unspecified whether generalized or localized, lower leg   ? Other and unspecified hyperlipidemia   ? Peripheral vascular disease, unspecified ([place])   ? Seizures ([place])   ? unknown etiology; on meds, last seizure was 2015  ? Spinal stenosis, unspecified region other than cervical   ? Spondylosis of unspecified site without mention of myelopathy   ? ?Past Surgical History:  ?Procedure Laterality Date  ? BILIARY STENT PLACEMENT N/A 05/27/2018  ? Procedure: BILIARY STENT PLACEMENT;  Surgeon: Rogene Houston, MD;  [facility];  Service: Endoscopy;  Laterality: N/A;  ? COLONOSCOPY N/A 08/10/2013  ? Procedure: COLONOSCOPY;  Surgeon: Rogene Houston, MD;  [facility];  Service: Endoscopy;  Laterality: N/A;  240  ? ERCP N/A 03/02/2018  ? Procedure: ENDOSCOPIC RETROGRADE CHOLANGIOPANCREATOGRAPHY (ERCP) With sphincterotomy and stent placement;  Surgeon: Rogene Houston, MD;  [facility];  Service: Gastroenterology;  Laterality: N/A;  ? ERCP N/A 05/27/2018  ? Procedure: ENDOSCOPIC RETROGRADE CHOLANGIOPANCREATOGRAPHY (ERCP);  Surgeon: Rogene Houston, MD;  [facility];  Service: Endoscopy;  Laterality: N/A;  ? ERCP N/A  09/16/2018  ? Procedure: ENDOSCOPIC RETROGRADE CHOLANGIOPANCREATOGRAPHY (ERCP);  Surgeon: Rogene Houston, MD;  [facility];  Service: Endoscopy;  Laterality: N/A;  ? GASTROINTESTINAL STENT REMOVAL N/A 05/27/2018  ? Procedure: Biliary STENT REMOVAL;  Surgeon: Rogene Houston, MD;  [facility];  Service: Endoscopy;  Laterality: N/A;  ? GASTROINTESTINAL STENT REMOVAL N/A 09/16/2018  ? Procedure: GASTROINTESTINAL STENT REMOVAL;  Surgeon: Rogene Houston, MD;  [facility];  Service: Endoscopy;  Laterality: N/A;  ? kidney stones left x2  1975  ? KIDNEY SURGERY    ? Ruptured left kidney 30 yrs ago  from a kidney stone  ? LITHOTRIPSY N/A 05/27/2018  ? Procedure: MECHANICAL LITHOTRIPSY;  Surgeon: Rogene Houston, MD;  [facility];  Service: Endoscopy;  Laterality: N/A;  ? REMOVAL OF STONES N/A 09/16/2018  ? Procedure: REMOVAL OF MULTIPLE STONES WITH BASKET AND BALLOON;  Surgeon: Rogene Houston, MD;  [facility];  Service: Endoscopy;  Laterality: N/A;  ? SPHINCTEROTOMY N/A 05/27/2018  ? Procedure: SPHINCTEROTOMY extended;  Surgeon: Rogene Houston, MD;  [facility];  Service: Endoscopy;  Laterality: N/A;  ? SPYGLASS CHOLANGIOSCOPY N/A 05/27/2018  ? Procedure: SPYGLASS CHOLANGIOSCOPY;  Surgeon: Rogene Houston, MD;  [facility];  Service: Endoscopy;  Laterality: N/A;  ? ?Social History  ? ?Socioeconomic History  ? Marital status: Married  ?  Spouse name: Chase [name]  ? Number of children:  5  ? Years of education: 8  ? Highest education level: 8th grade  ?Occupational History  ? Occupation: retired   ?  Employer: RETIRED  ?Tobacco Use  ? Smoking status: Never  ? Smokeless tobacco: Never  ?Vaping Use  ? Vaping Use: Never used  ?Substance and Sexual Activity  ? Alcohol use: No  ?  Alcohol/week: 0.0 standard drinks  ? Drug use: No  ? Sexual activity: Not Currently  ?Other Topics Concern  ? Not on file  ?Social History Narrative  ? Not on file   ? ?Social Determinants of Health  ? ?Financial Resource Strain: Medium Risk  ? Difficulty of Paying Living Expenses: Somewhat hard  ?Food Insecurity: No Food Insecurity  ? Worried About Charity fundraiser in the Last Year: Never true  ? Ran Out of Food in the Last Year: Never true  ?Transportation Needs: No Transportation Needs  ? Lack of Transportation (Medical): No  ? Lack of Transportation (Non-Medical): No  ?Physical Activity: Sufficiently Active  ? Days of Exercise per Week: 5 days  ? Minutes of Exercise per Session: 30 min  ?Stress: No Stress Concern Present  ? Feeling of Stress : Not at all  ?Social Connections: Moderately Integrated  ? Frequency of Communication with Friends and Family: Once a week  ? Frequency of Social Gatherings with Friends and Family: Twice a week  ? Attends Religious Services: More than 4 times per year  ? Active Member of Clubs or Organizations: No  ? Attends Archivist Meetings: Never  ? Marital Status: Married  ? ?Outpatient Encounter Medications as of 04/08/2022  ?Medication Sig  ? amLODipine (NORVASC) 10 MG tablet TAKE 1 TABLET DAILY  ? calcium carbonate (TUMS - DOSED IN MG ELEMENTAL CALCIUM) 500 MG chewable tablet Chew 0.5 tablets by mouth 2 (two) times daily.  ? clopidogrel (PLAVIX) 75 MG tablet TAKE 1 TABLET DAILY  ? diphenoxylate-atropine (LOMOTIL) 2.5-0.025 MG tablet Take one tablet by mouth once daily, as needed, for watery, loose stool  ? donepezil (ARICEPT) 5 MG tablet Take 5 mg by mouth daily.  ? famotidine (PEPCID) 20 MG tablet Take 20 mg by mouth 2 (two) times daily.  ? glucose blood (ONETOUCH ULTRA) test strip USE 1 STRIP THREE TIMES A DAY AS INSTRUCTED.  ? hydrALAZINE (APRESOLINE) 25 MG tablet Take 25 mg by mouth in the morning and at bedtime.  ? Insulin Pen Needle (B-D ULTRAFINE III SHORT PEN) 31G X 8 MM MISC 1 each by Does not apply route as directed.  ? levETIRAcetam (KEPPRA) 100 MG/ML solution Take 500 mg by mouth daily.  ? levothyroxine (SYNTHROID) 112  MCG tablet TAKE 1 TABLET DAILY BEFORE BREAKFAST  ? Multiple Vitamins-Minerals (MULTIVITAMIN WITH MINERALS) tablet Take 1 tablet by mouth daily.  ? olopatadine (PATANOL) 0.1 % ophthalmic solution INSTILL 1 DROP INTO EACH EYE TWICE DAILY  ? ondansetron (ZOFRAN-ODT) 8 MG disintegrating tablet Take 8 mg by mouth every 8 (eight) hours as needed.  ? OneTouch Delica Lancets 35K MISC 1 each by Other route 2 (two) times daily.  ? polyethylene glycol powder (GLYCOLAX/MIRALAX) 17 GM/SCOOP powder Take 17 g by mouth daily. (Patient taking differently: Take 17 g by mouth daily as needed.)  ? pravastatin (PRAVACHOL) 20 MG tablet TAKE 1 TABLET DAILY  ? sodium bicarbonate 650 MG tablet Take 650 mg by mouth 3 (three) times daily.  ? tamsulosin (FLOMAX) 0.4 MG CAPS capsule TAKE 1 CAPSULE DAILY  ? [DISCONTINUED] HUMULIN 70/30 KWIKPEN (70-30)  100 UNIT/ML KwikPen INJECT 10 UNITS UNDER THE SKIN TWICE A DAY  ? [DISCONTINUED] ciprofloxacin (CIPRO) 500 MG tablet Take 500 mg by mouth 2 (two) times daily. (Patient not taking: Reported on 03/04/2022)  ? [DISCONTINUED] levETIRAcetam (KEPPRA) 500 MG tablet Take 500 mg by mouth at bedtime. (Patient not taking: Reported on 04/08/2022)  ? [DISCONTINUED] metroNIDAZOLE (FLAGYL) 500 MG tablet Take 500 mg by mouth 3 (three) times daily. (Patient not taking: Reported on 04/08/2022)  ? ?No facility-administered encounter medications on file as of 04/08/2022.  ? ?ALLERGIES: ?Allergies  ?Allergen Reactions  ? Bayer Advanced Aspirin [Aspirin] Nausea And Vomiting  ? Penicillins Nausea And Vomiting  ?  Has patient had a PCN reaction causing immediate rash, facial/tongue/throat swelling, SOB or lightheadedness with hypotension: unknown ?Has patient had a PCN reaction causing severe rash involving mucus membranes or skin necrosis: unknown ?Has patient had a PCN reaction that required hospitalization: unknown ?Has patient had a PCN reaction occurring within the last 10 years: no ?If all of the above answers are "NO",  then may proceed with Cephalosporin use.  ? ?VACCINATION STATUS: ?Immunization History  ?Administered Date(s) Administered  ? Fluad Quad(high Dose 65+) 08/07/2019, 10/07/2020  ? H1N1 11/14/2008  ? Influenza Sp

## 2022-04-09 ENCOUNTER — Ambulatory Visit (INDEPENDENT_AMBULATORY_CARE_PROVIDER_SITE_OTHER): Payer: Medicare Other | Admitting: *Deleted

## 2022-04-09 DIAGNOSIS — I10 Essential (primary) hypertension: Secondary | ICD-10-CM

## 2022-04-09 DIAGNOSIS — E1121 Type 2 diabetes mellitus with diabetic nephropathy: Secondary | ICD-10-CM

## 2022-04-09 DIAGNOSIS — R4182 Altered mental status, unspecified: Secondary | ICD-10-CM

## 2022-04-09 NOTE — Chronic Care Management (AMB) (Signed)
?Chronic Care Management  ? ?CCM RN Visit Note ? ?04/09/2022 ?Name: Chase [name] MRN: [id number] DOB: 07-Jun-1931 ? ?Subjective: ?Chase [name] is a 88 y.o. year old male who is a primary care patient of Moshe Cipro Norwood Levo, MD. The care management team was consulted for assistance with disease management and care coordination needs.   ? ?Engaged with patient by telephone for follow up visit in response to provider referral for case management and/or care coordination services.  ? ?Consent to Services:  ?The patient was given information about Chronic Care Management services, agreed to services, and gave verbal consent prior to initiation of services.  Please see initial visit note for detailed documentation.  ? ?Patient agreed to services and verbal consent obtained.  ? ?Assessment: Review of patient past medical history, allergies, medications, health status, including review of consultants reports, laboratory and other test data, was performed as part of comprehensive evaluation and provision of chronic care management services.  ? ?SDOH (Social Determinants of Health) assessments and interventions performed:   ? ?CCM Care Plan ? ?Allergies  ?Allergen Reactions  ? Bayer Advanced Aspirin [Aspirin] Nausea And Vomiting  ? Penicillins Nausea And Vomiting  ?  Has patient had a PCN reaction causing immediate rash, facial/tongue/throat swelling, SOB or lightheadedness with hypotension: unknown ?Has patient had a PCN reaction causing severe rash involving mucus membranes or skin necrosis: unknown ?Has patient had a PCN reaction that required hospitalization: unknown ?Has patient had a PCN reaction occurring within the last 10 years: no ?If all of the above answers are "NO", then may proceed with Cephalosporin use.  ? ? ?Outpatient Encounter Medications as of 04/09/2022  ?Medication Sig  ? amLODipine (NORVASC) 10 MG tablet TAKE 1 TABLET DAILY  ? calcium carbonate (TUMS - DOSED IN MG ELEMENTAL CALCIUM) 500 MG chewable tablet  Chew 0.5 tablets by mouth 2 (two) times daily.  ? clopidogrel (PLAVIX) 75 MG tablet TAKE 1 TABLET DAILY  ? diphenoxylate-atropine (LOMOTIL) 2.5-0.025 MG tablet Take one tablet by mouth once daily, as needed, for watery, loose stool  ? donepezil (ARICEPT) 5 MG tablet Take 5 mg by mouth daily.  ? famotidine (PEPCID) 20 MG tablet Take 20 mg by mouth 2 (two) times daily.  ? glucose blood (ONETOUCH ULTRA) test strip USE 1 STRIP THREE TIMES A DAY AS INSTRUCTED.  ? hydrALAZINE (APRESOLINE) 25 MG tablet Take 25 mg by mouth in the morning and at bedtime.  ? Insulin Pen Needle (B-D ULTRAFINE III SHORT PEN) 31G X 8 MM MISC 1 each by Does not apply route as directed.  ? levETIRAcetam (KEPPRA) 100 MG/ML solution Take 500 mg by mouth daily.  ? levothyroxine (SYNTHROID) 112 MCG tablet TAKE 1 TABLET DAILY BEFORE BREAKFAST  ? Multiple Vitamins-Minerals (MULTIVITAMIN WITH MINERALS) tablet Take 1 tablet by mouth daily.  ? olopatadine (PATANOL) 0.1 % ophthalmic solution INSTILL 1 DROP INTO EACH EYE TWICE DAILY  ? ondansetron (ZOFRAN-ODT) 8 MG disintegrating tablet Take 8 mg by mouth every 8 (eight) hours as needed.  ? OneTouch Delica Lancets 89H MISC 1 each by Other route 2 (two) times daily.  ? polyethylene glycol powder (GLYCOLAX/MIRALAX) 17 GM/SCOOP powder Take 17 g by mouth daily. (Patient taking differently: Take 17 g by mouth daily as needed.)  ? pravastatin (PRAVACHOL) 20 MG tablet TAKE 1 TABLET DAILY  ? sodium bicarbonate 650 MG tablet Take 650 mg by mouth 3 (three) times daily.  ? tamsulosin (FLOMAX) 0.4 MG CAPS capsule TAKE 1 CAPSULE DAILY  ? ?  No facility-administered encounter medications on file as of 04/09/2022.  ? ? ?Patient Active Problem List  ? Diagnosis Date Noted  ? Delusional disorder ([place]) 03/16/2022  ? Aggressive behavior 03/16/2022  ? Poor appetite 03/16/2022  ? COVID-19 08/08/2021  ? Epilepsy ([place]) 11/07/2020  ? Polyneuropathy due to type 2 diabetes mellitus ([place]) 11/07/2020  ? Atherosclerosis of native artery of  left lower extremity with ulceration of ankle ([place]) 08/23/2020  ? Loose stools 10/28/2019  ? Incontinence 07/08/2019  ? Osteoarthritis of left knee 04/15/2019  ? Diarrhea 04/15/2019  ? Common bile duct stone 04/18/2018  ? Cholecystocutaneous fistula   ? Physical deconditioning 02/28/2018  ? Choledocholithiasis 02/25/2018  ? Hyperkalemia 02/24/2018  ? Metabolic bone disease 25/95/6387  ? Fistula of bile duct 02/14/2018  ? Dermatitis 02/25/2017  ? Essential hypertension, benign 12/13/2015  ? RBBB (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 fascicular block)   ? Hypertensive heart disease 12/01/2014  ? Chronic venous insufficiency 11/16/2014  ? Carotid artery disease ([place]) 05/18/2014  ? Poor balance 09/11/2013  ? Difficulty walking 09/11/2013  ? Hx of falling 09/11/2013  ? History of stroke 09/07/2012  ? CKD (chronic kidney disease) stage 4, GFR 15-29 ml/min (HCC) 09/07/2012  ? Anemia of chronic disease 09/07/2012  ? Cholelithiasis 08/10/2012  ? Peripheral autonomic neuropathy due to diabetes mellitus ([place]) 03/14/2012  ? Seizure disorder, complex partial ([place]) 03/07/2012  ? Type 2 diabetes mellitus with diabetic nephropathy ([place])   ? Hypothyroidism 03/09/2008  ? Mixed hyperlipidemia 03/09/2008  ? Renovascular hypertension 03/09/2008  ? Peripheral vascular disease ([place]) 03/09/2008  ? DEGENERATIVE JOINT DISEASE, KNEE 01/26/2008  ? Osteoarthritis of spine 01/26/2008  ? Spinal stenosis   ? ? ?Conditions to be addressed/monitored:HTN and DMII ? ?Care Plan : RN Care Manager Plan of Care  ?Updates made by Kassie Mends, RN since 04/09/2022 12:00 AM  ?  ? ?Problem: No plan of care established for management of chronic disease state  (DM2, HTN, CKD stage 4)   ?Priority: High  ?  ? ?Long-Range Goal: Development of plan of care for chronic disease management  (DM2, HTN, CKD stage 4)   ?Start Date: 03/04/2022  ?Expected End Date: 08/31/2022  ?Priority: High  ?Note:   ?Current Barriers:  ?Knowledge Deficits related to plan of care  for management of HTN and DMII  ?No Advanced Directives in place- family requests information be mailed ?Spoke with spouse [name] (permission given by pt), reports pt requires assistance with ADL's and IADL's (such as bathing, mobility, transportation, medication oversight, etc.) Pt is on grant program through [employer] and has aide M-F 8 am- 12 noon. CG reports received resources for wheelchair ramp, home modifications and transportation, patient did receive new wheelchair, reports they are still having increasing difficulty with getting pt out of the house and into the car. ?Spouse reports  CBG now monitored with [employer] 2 since end of March, reports  FBS ranges around 100, random ranges 200's, pt does not follow a special diet. ?Blood pressure is not monitored. ?Spouse reports patient went to ED recently for change in behavior, states referral placed for Dr. Merlene Laughter, spouse states " I think we saw him, I'll make sure with my daughter", reports brain scan and lab work also ordered, spouse reports patient is very quiet today and has not had recent episode with behavioral issues. ? ?RNCM Clinical Goal(s):  ?Patient will verbalize understanding of plan for management of HTN and DMII as evidenced by caregiver report, review of EHR  and  through collaboration with RN Care manager, provider, and care team.  ? ?Interventions: ?1:1 collaboration with primary care provider regarding development and update of comprehensive plan of care as evidenced by provider attestation and co-signature ?Inter-disciplinary care team collaboration (see longitudinal plan of care) ?Evaluation of current treatment plan related to  self management and patient's adherence to plan as established by provider ? ? ?Diabetes Interventions:  (Status:  New goal. and Goal on track:  Yes.) Long Term Goal ?Assessed patient's understanding of A1c goal: <7% ?Provided education to patient about basic DM disease process ?Reviewed medications with patient  and discussed importance of medication adherence ?Review of patient status, including review of consultants reports, relevant laboratory and other test results, and medications completed ?Reinforced c

## 2022-04-09 NOTE — Patient Instructions (Signed)
Visit Information ? ?Thank you for taking time to visit with me today. Please don't hesitate to contact me if I can be of assistance to you before our next scheduled telephone appointment. ? ?Following are the goals we discussed today:  ?Take medications as prescribed   ?Attend all scheduled provider appointments ?Call pharmacy for medication refills 3-7 days in advance of running out of medications ?Call provider office for new concerns or questions  ?check blood sugar at prescribed times: twice daily ?check feet daily for cuts, sores or redness ?enter blood sugar readings and medication or insulin into daily log ?take the blood sugar log to all doctor visits ?take the blood sugar meter to all doctor visits ?trim toenails straight across ?drink 6 to 8 glasses of water each day ?fill half of plate with vegetables ?check blood pressure weekly ?choose a place to take my blood pressure (home, clinic or office, retail store) ?write blood pressure results in a log or diary ?learn about high blood pressure ?keep all doctor appointments ?take medications for blood pressure exactly as prescribed ?eat more whole grains, fruits and vegetables, lean meats and healthy fats ?Follow low sodium diet, limit fast food ?Continue working with home health physical therapist for strengthening ?Tips for everyday care of patients with dementia/ behavioral issues: keep a routine, such as bathing dressing eating at the same time each day, plan activities that the person enjoys and try to do them at the same time each day, serve meals in a consistent, familiar place and give patient enough time to eat, encourage use of loose-fitting, comfortable, easy to use clothing such as, elastic waistbands, fabric fasteners, or large zipper pulls instead of shoelaces, buttons, or buckles. ? ?Our next appointment is by telephone on 06/11/22 at 130 pm ? ?Please call the care guide team at [phone] if you need to cancel or reschedule your appointment.   ? ?If you are experiencing a Mental Health or [place] or need someone to talk to, please call the Suicide and Crisis Lifeline: 988 ?call the [place] National Suicide Prevention Lifeline: (587) 801-9737 or TTY: 418-773-3050 TTY 610-598-4456) to talk to a trained counselor ?call 1-800-273-TALK (toll free, 24 hour hotline) ?go to [facility] [address] [place] [phone]) ?call the [facility]: [phone] ?call 911  ? ?The patient verbalized understanding of instructions, educational materials, and care plan provided today and declined offer to receive copy of patient instructions, educational materials, and care plan.  ? ?Jacqlyn Larsen RNC, BSN ?RN Case Manager ?[place] ?[phone] ? ?

## 2022-04-10 DIAGNOSIS — N184 Chronic kidney disease, stage 4 (severe): Secondary | ICD-10-CM | POA: Diagnosis not present

## 2022-04-10 DIAGNOSIS — D631 Anemia in chronic kidney disease: Secondary | ICD-10-CM | POA: Diagnosis not present

## 2022-04-14 ENCOUNTER — Telehealth: Payer: Self-pay

## 2022-04-14 NOTE — Telephone Encounter (Signed)
FYI---Karen with [facility] called to make [place] aware they had to add nursing to plan of care in order to do labs for patient ph# [phone] ?

## 2022-04-16 DIAGNOSIS — G8929 Other chronic pain: Secondary | ICD-10-CM | POA: Diagnosis not present

## 2022-04-16 DIAGNOSIS — I150 Renovascular hypertension: Secondary | ICD-10-CM | POA: Diagnosis not present

## 2022-04-16 DIAGNOSIS — M479 Spondylosis, unspecified: Secondary | ICD-10-CM | POA: Diagnosis not present

## 2022-04-16 DIAGNOSIS — R531 Weakness: Secondary | ICD-10-CM | POA: Diagnosis not present

## 2022-04-16 DIAGNOSIS — R269 Unspecified abnormalities of gait and mobility: Secondary | ICD-10-CM | POA: Diagnosis not present

## 2022-04-16 DIAGNOSIS — M1712 Unilateral primary osteoarthritis, left knee: Secondary | ICD-10-CM | POA: Diagnosis not present

## 2022-04-17 DIAGNOSIS — M479 Spondylosis, unspecified: Secondary | ICD-10-CM | POA: Diagnosis not present

## 2022-04-17 DIAGNOSIS — I150 Renovascular hypertension: Secondary | ICD-10-CM | POA: Diagnosis not present

## 2022-04-17 DIAGNOSIS — M1712 Unilateral primary osteoarthritis, left knee: Secondary | ICD-10-CM | POA: Diagnosis not present

## 2022-04-17 DIAGNOSIS — R531 Weakness: Secondary | ICD-10-CM | POA: Diagnosis not present

## 2022-04-17 DIAGNOSIS — G8929 Other chronic pain: Secondary | ICD-10-CM | POA: Diagnosis not present

## 2022-04-17 DIAGNOSIS — R269 Unspecified abnormalities of gait and mobility: Secondary | ICD-10-CM | POA: Diagnosis not present

## 2022-04-21 DIAGNOSIS — R269 Unspecified abnormalities of gait and mobility: Secondary | ICD-10-CM | POA: Diagnosis not present

## 2022-04-21 DIAGNOSIS — G8929 Other chronic pain: Secondary | ICD-10-CM | POA: Diagnosis not present

## 2022-04-21 DIAGNOSIS — R531 Weakness: Secondary | ICD-10-CM | POA: Diagnosis not present

## 2022-04-21 DIAGNOSIS — M479 Spondylosis, unspecified: Secondary | ICD-10-CM | POA: Diagnosis not present

## 2022-04-21 DIAGNOSIS — M1712 Unilateral primary osteoarthritis, left knee: Secondary | ICD-10-CM | POA: Diagnosis not present

## 2022-04-21 DIAGNOSIS — I150 Renovascular hypertension: Secondary | ICD-10-CM | POA: Diagnosis not present

## 2022-04-23 ENCOUNTER — Telehealth: Payer: Self-pay

## 2022-04-23 DIAGNOSIS — M1712 Unilateral primary osteoarthritis, left knee: Secondary | ICD-10-CM | POA: Diagnosis not present

## 2022-04-23 DIAGNOSIS — M479 Spondylosis, unspecified: Secondary | ICD-10-CM | POA: Diagnosis not present

## 2022-04-23 DIAGNOSIS — R269 Unspecified abnormalities of gait and mobility: Secondary | ICD-10-CM | POA: Diagnosis not present

## 2022-04-23 DIAGNOSIS — R531 Weakness: Secondary | ICD-10-CM | POA: Diagnosis not present

## 2022-04-23 DIAGNOSIS — G8929 Other chronic pain: Secondary | ICD-10-CM | POA: Diagnosis not present

## 2022-04-23 DIAGNOSIS — I150 Renovascular hypertension: Secondary | ICD-10-CM | POA: Diagnosis not present

## 2022-04-23 NOTE — Telephone Encounter (Signed)
Gave her a verbal ok to attempt again

## 2022-04-23 NOTE — Telephone Encounter (Signed)
Lewanda Rife Nurse called from [facility], orders for labs to be drawn, attempted 2 times unsuccessful needs a verbal to attempt on next nurse visit.  Call back [phone].

## 2022-04-24 DIAGNOSIS — M479 Spondylosis, unspecified: Secondary | ICD-10-CM | POA: Diagnosis not present

## 2022-04-24 DIAGNOSIS — I150 Renovascular hypertension: Secondary | ICD-10-CM | POA: Diagnosis not present

## 2022-04-24 DIAGNOSIS — M1712 Unilateral primary osteoarthritis, left knee: Secondary | ICD-10-CM | POA: Diagnosis not present

## 2022-04-24 DIAGNOSIS — G8929 Other chronic pain: Secondary | ICD-10-CM | POA: Diagnosis not present

## 2022-04-24 DIAGNOSIS — R531 Weakness: Secondary | ICD-10-CM | POA: Diagnosis not present

## 2022-04-24 DIAGNOSIS — R269 Unspecified abnormalities of gait and mobility: Secondary | ICD-10-CM | POA: Diagnosis not present

## 2022-04-28 ENCOUNTER — Telehealth: Payer: Self-pay | Admitting: Family Medicine

## 2022-04-28 NOTE — Telephone Encounter (Signed)
Crystal w. [facility] called in on patient behlaf.  Received unsigned orders for patient.  Wants a call back in regard   [phone]

## 2022-04-29 ENCOUNTER — Telehealth: Payer: Self-pay

## 2022-04-29 DIAGNOSIS — I150 Renovascular hypertension: Secondary | ICD-10-CM | POA: Diagnosis not present

## 2022-04-29 DIAGNOSIS — M1712 Unilateral primary osteoarthritis, left knee: Secondary | ICD-10-CM | POA: Diagnosis not present

## 2022-04-29 DIAGNOSIS — M479 Spondylosis, unspecified: Secondary | ICD-10-CM | POA: Diagnosis not present

## 2022-04-29 DIAGNOSIS — R269 Unspecified abnormalities of gait and mobility: Secondary | ICD-10-CM | POA: Diagnosis not present

## 2022-04-29 DIAGNOSIS — G8929 Other chronic pain: Secondary | ICD-10-CM | POA: Diagnosis not present

## 2022-04-29 DIAGNOSIS — R531 Weakness: Secondary | ICD-10-CM | POA: Diagnosis not present

## 2022-04-29 NOTE — Telephone Encounter (Signed)
Verbal order given  

## 2022-04-29 NOTE — Telephone Encounter (Signed)
Leigh the nurse called from [facility] needs to get an order patient stage 2 on bottom will do calcium and cover with bottom border foam.  Nurse all back # [phone].

## 2022-04-29 NOTE — Telephone Encounter (Signed)
Will refax.

## 2022-05-01 DIAGNOSIS — I150 Renovascular hypertension: Secondary | ICD-10-CM | POA: Diagnosis not present

## 2022-05-01 DIAGNOSIS — R531 Weakness: Secondary | ICD-10-CM | POA: Diagnosis not present

## 2022-05-01 DIAGNOSIS — G8929 Other chronic pain: Secondary | ICD-10-CM | POA: Diagnosis not present

## 2022-05-01 DIAGNOSIS — M479 Spondylosis, unspecified: Secondary | ICD-10-CM | POA: Diagnosis not present

## 2022-05-01 DIAGNOSIS — R269 Unspecified abnormalities of gait and mobility: Secondary | ICD-10-CM | POA: Diagnosis not present

## 2022-05-01 DIAGNOSIS — M1712 Unilateral primary osteoarthritis, left knee: Secondary | ICD-10-CM | POA: Diagnosis not present

## 2022-05-06 ENCOUNTER — Encounter: Payer: Self-pay | Admitting: Family Medicine

## 2022-05-06 ENCOUNTER — Ambulatory Visit (INDEPENDENT_AMBULATORY_CARE_PROVIDER_SITE_OTHER): Payer: Medicare Other | Admitting: Family Medicine

## 2022-05-06 VITALS — BP 135/70 | HR 80 | Ht 68.0 in | Wt 166.0 lb

## 2022-05-06 DIAGNOSIS — I639 Cerebral infarction, unspecified: Secondary | ICD-10-CM | POA: Diagnosis not present

## 2022-05-06 DIAGNOSIS — E1169 Type 2 diabetes mellitus with other specified complication: Secondary | ICD-10-CM

## 2022-05-06 DIAGNOSIS — R197 Diarrhea, unspecified: Secondary | ICD-10-CM | POA: Diagnosis not present

## 2022-05-06 DIAGNOSIS — Z794 Long term (current) use of insulin: Secondary | ICD-10-CM | POA: Diagnosis not present

## 2022-05-06 DIAGNOSIS — I10 Essential (primary) hypertension: Secondary | ICD-10-CM

## 2022-05-06 DIAGNOSIS — L98429 Non-pressure chronic ulcer of back with unspecified severity: Secondary | ICD-10-CM | POA: Diagnosis not present

## 2022-05-06 MED ORDER — DIPHENOXYLATE-ATROPINE 2.5-0.025 MG PO TABS: ORAL_TABLET | ORAL | 0 refills | Status: DC

## 2022-05-06 NOTE — Assessment & Plan Note (Signed)
3 week h/o recurrent ulcers on lower back/ buttock, 2 dime sized stage 2 ulcers visible, no draingae present, surrounding tissue healthy, refer for h/h 3 times weekly for wound care until healed, approx 5 to 6 weeks

## 2022-05-06 NOTE — Assessment & Plan Note (Signed)
Controlled, no change in medication  

## 2022-05-06 NOTE — Patient Instructions (Addendum)
F/U end August as before, call if you need me sooner  You are referred to home health nurse three times weeklY for 6 weeks, to manage ulcer  Lomotil is precribed for loose stool  Your thyroid TEST IS GOOD   CAREFUL NOT TO FALL  Thanks for choosing [facility], we consider it a privelige to serve you.

## 2022-05-06 NOTE — Progress Notes (Signed)
   Chase [name]     MRN: [id number]      DOB: 06-25-1931   HPI Chase [name] is here with 2 to 3 week h/o open sore on  on buttock, which has been healing and breaking down again per report of family No h/o fever or chills, no h/o purulent drainage C/o loose stool/ diarrheah during this time ROS Denies recent fever or chills. . Denies  leg swelling Denies abdominal pain, nausea, vomiting,diarrhea or constipation.   Denies dysuria, frequency, hesitancy or incontinence. Chronic  joint pain, swelling and limitation in mobility. Denies headaches, seizures, numbness, or tingling. PE  BP 135/70   Pulse 80   Ht '5\' 8"'$  (1.727 m)   Wt 166 lb (75.3 kg)   SpO2 97%   BMI 25.24 kg/m   Patient alert  and in no cardiopulmonary distress. HEENT: No facial asymmetry, EOMI,     Neck decreased ROM .  Chest: Clear to auscultation bilaterally.  CVS: S1, S2 no murmurs, no S3.Regular rate.  .   Ext: No edema  MS: Markedly decreased  ROM spine, shoulders, hips and knees.  Skin: 2 dime sized ulcers on lower back just proxima;l to gluteal fold, involving skin and underlying tissue, no drainage from wounds, surrounding skin appears healthy  CNS: CN 2-12 intact,  Assessment & Plan  Sacral ulcer ([place]) 3 week h/o recurrent ulcers on lower back/ buttock, 2 dime sized stage 2 ulcers visible, no draingae present, surrounding tissue healthy, refer for h/h 3 times weekly for wound care until healed, approx 5 to 6 weeks  Essential hypertension, benign Controlled, no change in medication   Diarrhea 3 week history, lomotil prescribedonce daily as needed

## 2022-05-06 NOTE — Assessment & Plan Note (Signed)
3 week history, lomotil prescribedonce daily as needed

## 2022-05-08 ENCOUNTER — Other Ambulatory Visit: Payer: Self-pay | Admitting: Family Medicine

## 2022-05-08 ENCOUNTER — Telehealth: Payer: Self-pay | Admitting: Family Medicine

## 2022-05-08 MED ORDER — HYDROXYZINE HCL 10 MG PO TABS: ORAL_TABLET | ORAL | 1 refills | Status: DC

## 2022-05-08 NOTE — Telephone Encounter (Signed)
LVM on home and cell making patient's wife aware

## 2022-05-08 NOTE — Telephone Encounter (Signed)
Pt wife called stating that he is not wanting to get up & saying things he is not normally saying. States he won't eat & hasn't had his bath because he does not want then to get him up. He did have a light seizure yesterday. Wants to know if there is anything that he can be given to calm him down?    [place]

## 2022-05-11 ENCOUNTER — Telehealth: Payer: Self-pay | Admitting: Family Medicine

## 2022-05-11 NOTE — Telephone Encounter (Signed)
Referral was sent 5/31 to karen at [facility] and I sent her a message to follow up on the referral and contact patient and let me know asap

## 2022-05-11 NOTE — Telephone Encounter (Signed)
Pt wife called stating the place on his buttocks is getting worse. States they were supposed to have a nurse sent out there to their home but have not heard anything. Wants to know what she needs to do?

## 2022-05-12 ENCOUNTER — Telehealth: Payer: Self-pay | Admitting: Family Medicine

## 2022-05-12 DIAGNOSIS — M4800 Spinal stenosis, site unspecified: Secondary | ICD-10-CM | POA: Diagnosis not present

## 2022-05-12 DIAGNOSIS — E1142 Type 2 diabetes mellitus with diabetic polyneuropathy: Secondary | ICD-10-CM | POA: Diagnosis not present

## 2022-05-12 DIAGNOSIS — F02811 Dementia in other diseases classified elsewhere, unspecified severity, with agitation: Secondary | ICD-10-CM | POA: Diagnosis not present

## 2022-05-12 DIAGNOSIS — Z8631 Personal history of diabetic foot ulcer: Secondary | ICD-10-CM | POA: Diagnosis not present

## 2022-05-12 DIAGNOSIS — M479 Spondylosis, unspecified: Secondary | ICD-10-CM | POA: Diagnosis not present

## 2022-05-12 DIAGNOSIS — I2510 Atherosclerotic heart disease of native coronary artery without angina pectoris: Secondary | ICD-10-CM | POA: Diagnosis not present

## 2022-05-12 DIAGNOSIS — I70202 Unspecified atherosclerosis of native arteries of extremities, left leg: Secondary | ICD-10-CM | POA: Diagnosis not present

## 2022-05-12 DIAGNOSIS — Z7902 Long term (current) use of antithrombotics/antiplatelets: Secondary | ICD-10-CM | POA: Diagnosis not present

## 2022-05-12 DIAGNOSIS — E039 Hypothyroidism, unspecified: Secondary | ICD-10-CM | POA: Diagnosis not present

## 2022-05-12 DIAGNOSIS — Z8673 Personal history of transient ischemic attack (TIA), and cerebral infarction without residual deficits: Secondary | ICD-10-CM | POA: Diagnosis not present

## 2022-05-12 DIAGNOSIS — L89152 Pressure ulcer of sacral region, stage 2: Secondary | ICD-10-CM | POA: Diagnosis not present

## 2022-05-12 DIAGNOSIS — I129 Hypertensive chronic kidney disease with stage 1 through stage 4 chronic kidney disease, or unspecified chronic kidney disease: Secondary | ICD-10-CM | POA: Diagnosis not present

## 2022-05-12 DIAGNOSIS — R32 Unspecified urinary incontinence: Secondary | ICD-10-CM | POA: Diagnosis not present

## 2022-05-12 DIAGNOSIS — Z79891 Long term (current) use of opiate analgesic: Secondary | ICD-10-CM | POA: Diagnosis not present

## 2022-05-12 DIAGNOSIS — Z794 Long term (current) use of insulin: Secondary | ICD-10-CM | POA: Diagnosis not present

## 2022-05-12 DIAGNOSIS — M1712 Unilateral primary osteoarthritis, left knee: Secondary | ICD-10-CM | POA: Diagnosis not present

## 2022-05-12 DIAGNOSIS — N184 Chronic kidney disease, stage 4 (severe): Secondary | ICD-10-CM | POA: Diagnosis not present

## 2022-05-12 DIAGNOSIS — R197 Diarrhea, unspecified: Secondary | ICD-10-CM | POA: Diagnosis not present

## 2022-05-12 DIAGNOSIS — E782 Mixed hyperlipidemia: Secondary | ICD-10-CM | POA: Diagnosis not present

## 2022-05-12 DIAGNOSIS — Z8616 Personal history of COVID-19: Secondary | ICD-10-CM | POA: Diagnosis not present

## 2022-05-12 DIAGNOSIS — G40209 Localization-related (focal) (partial) symptomatic epilepsy and epileptic syndromes with complex partial seizures, not intractable, without status epilepticus: Secondary | ICD-10-CM | POA: Diagnosis not present

## 2022-05-12 DIAGNOSIS — E1151 Type 2 diabetes mellitus with diabetic peripheral angiopathy without gangrene: Secondary | ICD-10-CM | POA: Diagnosis not present

## 2022-05-12 DIAGNOSIS — I872 Venous insufficiency (chronic) (peripheral): Secondary | ICD-10-CM | POA: Diagnosis not present

## 2022-05-12 DIAGNOSIS — E1122 Type 2 diabetes mellitus with diabetic chronic kidney disease: Secondary | ICD-10-CM | POA: Diagnosis not present

## 2022-05-12 DIAGNOSIS — D631 Anemia in chronic kidney disease: Secondary | ICD-10-CM | POA: Diagnosis not present

## 2022-05-12 NOTE — Telephone Encounter (Signed)
[facility] confirmed they are seeing him today

## 2022-05-12 NOTE — Telephone Encounter (Signed)
Pt wife called & I spoke with nurse Leigh, with [facility]. She is going to fax over some information in regards to the sore on his buttock. Nurse states he does urinate frequently and the area is staying wet.      Gaylyn Cheers [phone]

## 2022-05-14 DIAGNOSIS — E1142 Type 2 diabetes mellitus with diabetic polyneuropathy: Secondary | ICD-10-CM | POA: Diagnosis not present

## 2022-05-14 DIAGNOSIS — B351 Tinea unguium: Secondary | ICD-10-CM | POA: Diagnosis not present

## 2022-05-14 NOTE — Telephone Encounter (Signed)
Will await information from nurse to be faxed over if patient needs an appointment we can make this otherwise will await information

## 2022-05-15 DIAGNOSIS — R32 Unspecified urinary incontinence: Secondary | ICD-10-CM | POA: Diagnosis not present

## 2022-05-15 DIAGNOSIS — L89152 Pressure ulcer of sacral region, stage 2: Secondary | ICD-10-CM | POA: Diagnosis not present

## 2022-05-15 DIAGNOSIS — N184 Chronic kidney disease, stage 4 (severe): Secondary | ICD-10-CM | POA: Diagnosis not present

## 2022-05-15 DIAGNOSIS — R197 Diarrhea, unspecified: Secondary | ICD-10-CM | POA: Diagnosis not present

## 2022-05-15 DIAGNOSIS — F02811 Dementia in other diseases classified elsewhere, unspecified severity, with agitation: Secondary | ICD-10-CM | POA: Diagnosis not present

## 2022-05-15 DIAGNOSIS — I129 Hypertensive chronic kidney disease with stage 1 through stage 4 chronic kidney disease, or unspecified chronic kidney disease: Secondary | ICD-10-CM | POA: Diagnosis not present

## 2022-05-15 DIAGNOSIS — E039 Hypothyroidism, unspecified: Secondary | ICD-10-CM | POA: Diagnosis not present

## 2022-05-15 DIAGNOSIS — D631 Anemia in chronic kidney disease: Secondary | ICD-10-CM | POA: Diagnosis not present

## 2022-05-18 ENCOUNTER — Telehealth: Payer: Self-pay | Admitting: Family Medicine

## 2022-05-18 DIAGNOSIS — F02811 Dementia in other diseases classified elsewhere, unspecified severity, with agitation: Secondary | ICD-10-CM | POA: Diagnosis not present

## 2022-05-18 DIAGNOSIS — E039 Hypothyroidism, unspecified: Secondary | ICD-10-CM | POA: Diagnosis not present

## 2022-05-18 DIAGNOSIS — R197 Diarrhea, unspecified: Secondary | ICD-10-CM | POA: Diagnosis not present

## 2022-05-18 DIAGNOSIS — L89152 Pressure ulcer of sacral region, stage 2: Secondary | ICD-10-CM | POA: Diagnosis not present

## 2022-05-18 DIAGNOSIS — R32 Unspecified urinary incontinence: Secondary | ICD-10-CM | POA: Diagnosis not present

## 2022-05-18 DIAGNOSIS — I129 Hypertensive chronic kidney disease with stage 1 through stage 4 chronic kidney disease, or unspecified chronic kidney disease: Secondary | ICD-10-CM | POA: Diagnosis not present

## 2022-05-18 NOTE — Telephone Encounter (Signed)
Pt wife called stating he is needing a refill on diphenoxyylate-atrop 2.'5mg'$ . Can you please refill?   [place]

## 2022-05-19 ENCOUNTER — Other Ambulatory Visit: Payer: Self-pay

## 2022-05-19 ENCOUNTER — Telehealth: Payer: Self-pay | Admitting: Family Medicine

## 2022-05-19 DIAGNOSIS — I129 Hypertensive chronic kidney disease with stage 1 through stage 4 chronic kidney disease, or unspecified chronic kidney disease: Secondary | ICD-10-CM | POA: Diagnosis not present

## 2022-05-19 DIAGNOSIS — E039 Hypothyroidism, unspecified: Secondary | ICD-10-CM | POA: Diagnosis not present

## 2022-05-19 DIAGNOSIS — R197 Diarrhea, unspecified: Secondary | ICD-10-CM | POA: Diagnosis not present

## 2022-05-19 DIAGNOSIS — L89152 Pressure ulcer of sacral region, stage 2: Secondary | ICD-10-CM | POA: Diagnosis not present

## 2022-05-19 DIAGNOSIS — R32 Unspecified urinary incontinence: Secondary | ICD-10-CM | POA: Diagnosis not present

## 2022-05-19 DIAGNOSIS — F02811 Dementia in other diseases classified elsewhere, unspecified severity, with agitation: Secondary | ICD-10-CM | POA: Diagnosis not present

## 2022-05-19 MED ORDER — DIPHENOXYLATE-ATROPINE 2.5-0.025 MG PO TABS: ORAL_TABLET | ORAL | 0 refills | Status: DC

## 2022-05-19 NOTE — Telephone Encounter (Signed)
Refill sent.

## 2022-05-19 NOTE — Telephone Encounter (Signed)
Jamelle Haring with [facility] [phone], she is wanting to know if she should d/c his aricept due to him having diarrhea for several weeks. She is wondering if this is the cause of the diarrhea? Can you please call Tye [place] & pt wife to let them know what is decided?

## 2022-05-19 NOTE — Telephone Encounter (Signed)
Please advise 

## 2022-05-20 DIAGNOSIS — D631 Anemia in chronic kidney disease: Secondary | ICD-10-CM | POA: Diagnosis not present

## 2022-05-20 DIAGNOSIS — E1129 Type 2 diabetes mellitus with other diabetic kidney complication: Secondary | ICD-10-CM | POA: Diagnosis not present

## 2022-05-20 DIAGNOSIS — N189 Chronic kidney disease, unspecified: Secondary | ICD-10-CM | POA: Diagnosis not present

## 2022-05-20 DIAGNOSIS — N281 Cyst of kidney, acquired: Secondary | ICD-10-CM | POA: Diagnosis not present

## 2022-05-20 DIAGNOSIS — Z5989 Other problems related to housing and economic circumstances: Secondary | ICD-10-CM | POA: Diagnosis not present

## 2022-05-20 DIAGNOSIS — R809 Proteinuria, unspecified: Secondary | ICD-10-CM | POA: Diagnosis not present

## 2022-05-20 DIAGNOSIS — I129 Hypertensive chronic kidney disease with stage 1 through stage 4 chronic kidney disease, or unspecified chronic kidney disease: Secondary | ICD-10-CM | POA: Diagnosis not present

## 2022-05-20 DIAGNOSIS — E1122 Type 2 diabetes mellitus with diabetic chronic kidney disease: Secondary | ICD-10-CM | POA: Diagnosis not present

## 2022-05-20 DIAGNOSIS — I5032 Chronic diastolic (congestive) heart failure: Secondary | ICD-10-CM | POA: Diagnosis not present

## 2022-05-21 NOTE — Telephone Encounter (Signed)
Tye Maryland will contact [place] office

## 2022-05-21 NOTE — Telephone Encounter (Signed)
Can you let Cathy at [facility] know to contact [place],. Thanks

## 2022-05-22 DIAGNOSIS — E039 Hypothyroidism, unspecified: Secondary | ICD-10-CM | POA: Diagnosis not present

## 2022-05-22 DIAGNOSIS — I129 Hypertensive chronic kidney disease with stage 1 through stage 4 chronic kidney disease, or unspecified chronic kidney disease: Secondary | ICD-10-CM | POA: Diagnosis not present

## 2022-05-22 DIAGNOSIS — R197 Diarrhea, unspecified: Secondary | ICD-10-CM | POA: Diagnosis not present

## 2022-05-22 DIAGNOSIS — F02811 Dementia in other diseases classified elsewhere, unspecified severity, with agitation: Secondary | ICD-10-CM | POA: Diagnosis not present

## 2022-05-22 DIAGNOSIS — R32 Unspecified urinary incontinence: Secondary | ICD-10-CM | POA: Diagnosis not present

## 2022-05-22 DIAGNOSIS — L89152 Pressure ulcer of sacral region, stage 2: Secondary | ICD-10-CM | POA: Diagnosis not present

## 2022-05-26 DIAGNOSIS — R32 Unspecified urinary incontinence: Secondary | ICD-10-CM | POA: Diagnosis not present

## 2022-05-26 DIAGNOSIS — I129 Hypertensive chronic kidney disease with stage 1 through stage 4 chronic kidney disease, or unspecified chronic kidney disease: Secondary | ICD-10-CM | POA: Diagnosis not present

## 2022-05-26 DIAGNOSIS — F02811 Dementia in other diseases classified elsewhere, unspecified severity, with agitation: Secondary | ICD-10-CM | POA: Diagnosis not present

## 2022-05-26 DIAGNOSIS — L89152 Pressure ulcer of sacral region, stage 2: Secondary | ICD-10-CM | POA: Diagnosis not present

## 2022-05-26 DIAGNOSIS — E039 Hypothyroidism, unspecified: Secondary | ICD-10-CM | POA: Diagnosis not present

## 2022-05-26 DIAGNOSIS — R197 Diarrhea, unspecified: Secondary | ICD-10-CM | POA: Diagnosis not present

## 2022-05-27 DIAGNOSIS — R32 Unspecified urinary incontinence: Secondary | ICD-10-CM | POA: Diagnosis not present

## 2022-05-27 DIAGNOSIS — I129 Hypertensive chronic kidney disease with stage 1 through stage 4 chronic kidney disease, or unspecified chronic kidney disease: Secondary | ICD-10-CM | POA: Diagnosis not present

## 2022-05-27 DIAGNOSIS — L89152 Pressure ulcer of sacral region, stage 2: Secondary | ICD-10-CM | POA: Diagnosis not present

## 2022-05-27 DIAGNOSIS — F02811 Dementia in other diseases classified elsewhere, unspecified severity, with agitation: Secondary | ICD-10-CM | POA: Diagnosis not present

## 2022-05-27 DIAGNOSIS — R197 Diarrhea, unspecified: Secondary | ICD-10-CM | POA: Diagnosis not present

## 2022-05-27 DIAGNOSIS — E039 Hypothyroidism, unspecified: Secondary | ICD-10-CM | POA: Diagnosis not present

## 2022-05-29 DIAGNOSIS — R32 Unspecified urinary incontinence: Secondary | ICD-10-CM

## 2022-05-29 DIAGNOSIS — L89152 Pressure ulcer of sacral region, stage 2: Secondary | ICD-10-CM

## 2022-05-29 DIAGNOSIS — F02811 Dementia in other diseases classified elsewhere, unspecified severity, with agitation: Secondary | ICD-10-CM

## 2022-05-29 DIAGNOSIS — E039 Hypothyroidism, unspecified: Secondary | ICD-10-CM

## 2022-05-29 DIAGNOSIS — G40209 Localization-related (focal) (partial) symptomatic epilepsy and epileptic syndromes with complex partial seizures, not intractable, without status epilepticus: Secondary | ICD-10-CM

## 2022-05-29 DIAGNOSIS — E1142 Type 2 diabetes mellitus with diabetic polyneuropathy: Secondary | ICD-10-CM

## 2022-05-29 DIAGNOSIS — I70202 Unspecified atherosclerosis of native arteries of extremities, left leg: Secondary | ICD-10-CM

## 2022-05-29 DIAGNOSIS — I872 Venous insufficiency (chronic) (peripheral): Secondary | ICD-10-CM

## 2022-05-29 DIAGNOSIS — D531 Other megaloblastic anemias, not elsewhere classified: Secondary | ICD-10-CM

## 2022-05-29 DIAGNOSIS — I129 Hypertensive chronic kidney disease with stage 1 through stage 4 chronic kidney disease, or unspecified chronic kidney disease: Secondary | ICD-10-CM

## 2022-05-29 DIAGNOSIS — R197 Diarrhea, unspecified: Secondary | ICD-10-CM

## 2022-05-29 DIAGNOSIS — N184 Chronic kidney disease, stage 4 (severe): Secondary | ICD-10-CM

## 2022-05-29 DIAGNOSIS — E1122 Type 2 diabetes mellitus with diabetic chronic kidney disease: Secondary | ICD-10-CM

## 2022-05-29 DIAGNOSIS — E1151 Type 2 diabetes mellitus with diabetic peripheral angiopathy without gangrene: Secondary | ICD-10-CM

## 2022-06-02 DIAGNOSIS — I129 Hypertensive chronic kidney disease with stage 1 through stage 4 chronic kidney disease, or unspecified chronic kidney disease: Secondary | ICD-10-CM | POA: Diagnosis not present

## 2022-06-02 DIAGNOSIS — L89152 Pressure ulcer of sacral region, stage 2: Secondary | ICD-10-CM | POA: Diagnosis not present

## 2022-06-02 DIAGNOSIS — R197 Diarrhea, unspecified: Secondary | ICD-10-CM | POA: Diagnosis not present

## 2022-06-02 DIAGNOSIS — E039 Hypothyroidism, unspecified: Secondary | ICD-10-CM | POA: Diagnosis not present

## 2022-06-02 DIAGNOSIS — R32 Unspecified urinary incontinence: Secondary | ICD-10-CM | POA: Diagnosis not present

## 2022-06-02 DIAGNOSIS — F02811 Dementia in other diseases classified elsewhere, unspecified severity, with agitation: Secondary | ICD-10-CM | POA: Diagnosis not present

## 2022-06-04 ENCOUNTER — Other Ambulatory Visit: Payer: Self-pay | Admitting: Nurse Practitioner

## 2022-06-04 ENCOUNTER — Other Ambulatory Visit: Payer: Self-pay | Admitting: Family Medicine

## 2022-06-04 DIAGNOSIS — I150 Renovascular hypertension: Secondary | ICD-10-CM

## 2022-06-04 DIAGNOSIS — R197 Diarrhea, unspecified: Secondary | ICD-10-CM | POA: Diagnosis not present

## 2022-06-04 DIAGNOSIS — L89152 Pressure ulcer of sacral region, stage 2: Secondary | ICD-10-CM | POA: Diagnosis not present

## 2022-06-04 DIAGNOSIS — E039 Hypothyroidism, unspecified: Secondary | ICD-10-CM | POA: Diagnosis not present

## 2022-06-04 DIAGNOSIS — I129 Hypertensive chronic kidney disease with stage 1 through stage 4 chronic kidney disease, or unspecified chronic kidney disease: Secondary | ICD-10-CM | POA: Diagnosis not present

## 2022-06-04 DIAGNOSIS — F02811 Dementia in other diseases classified elsewhere, unspecified severity, with agitation: Secondary | ICD-10-CM | POA: Diagnosis not present

## 2022-06-04 DIAGNOSIS — R32 Unspecified urinary incontinence: Secondary | ICD-10-CM | POA: Diagnosis not present

## 2022-06-05 DIAGNOSIS — F02811 Dementia in other diseases classified elsewhere, unspecified severity, with agitation: Secondary | ICD-10-CM | POA: Diagnosis not present

## 2022-06-05 DIAGNOSIS — R32 Unspecified urinary incontinence: Secondary | ICD-10-CM | POA: Diagnosis not present

## 2022-06-05 DIAGNOSIS — L89152 Pressure ulcer of sacral region, stage 2: Secondary | ICD-10-CM | POA: Diagnosis not present

## 2022-06-05 DIAGNOSIS — E039 Hypothyroidism, unspecified: Secondary | ICD-10-CM | POA: Diagnosis not present

## 2022-06-05 DIAGNOSIS — I129 Hypertensive chronic kidney disease with stage 1 through stage 4 chronic kidney disease, or unspecified chronic kidney disease: Secondary | ICD-10-CM | POA: Diagnosis not present

## 2022-06-05 DIAGNOSIS — R197 Diarrhea, unspecified: Secondary | ICD-10-CM | POA: Diagnosis not present

## 2022-06-08 DIAGNOSIS — R32 Unspecified urinary incontinence: Secondary | ICD-10-CM | POA: Diagnosis not present

## 2022-06-08 DIAGNOSIS — F02811 Dementia in other diseases classified elsewhere, unspecified severity, with agitation: Secondary | ICD-10-CM | POA: Diagnosis not present

## 2022-06-08 DIAGNOSIS — R197 Diarrhea, unspecified: Secondary | ICD-10-CM | POA: Diagnosis not present

## 2022-06-08 DIAGNOSIS — E039 Hypothyroidism, unspecified: Secondary | ICD-10-CM | POA: Diagnosis not present

## 2022-06-08 DIAGNOSIS — I129 Hypertensive chronic kidney disease with stage 1 through stage 4 chronic kidney disease, or unspecified chronic kidney disease: Secondary | ICD-10-CM | POA: Diagnosis not present

## 2022-06-08 DIAGNOSIS — L89152 Pressure ulcer of sacral region, stage 2: Secondary | ICD-10-CM | POA: Diagnosis not present

## 2022-06-10 DIAGNOSIS — H02849 Edema of unspecified eye, unspecified eyelid: Secondary | ICD-10-CM | POA: Diagnosis not present

## 2022-06-10 DIAGNOSIS — E119 Type 2 diabetes mellitus without complications: Secondary | ICD-10-CM | POA: Diagnosis not present

## 2022-06-10 DIAGNOSIS — Z23 Encounter for immunization: Secondary | ICD-10-CM | POA: Diagnosis not present

## 2022-06-10 DIAGNOSIS — R197 Diarrhea, unspecified: Secondary | ICD-10-CM | POA: Diagnosis not present

## 2022-06-10 DIAGNOSIS — F02811 Dementia in other diseases classified elsewhere, unspecified severity, with agitation: Secondary | ICD-10-CM | POA: Diagnosis not present

## 2022-06-10 DIAGNOSIS — E039 Hypothyroidism, unspecified: Secondary | ICD-10-CM | POA: Diagnosis not present

## 2022-06-10 DIAGNOSIS — Z794 Long term (current) use of insulin: Secondary | ICD-10-CM | POA: Diagnosis not present

## 2022-06-10 DIAGNOSIS — R739 Hyperglycemia, unspecified: Secondary | ICD-10-CM | POA: Diagnosis not present

## 2022-06-10 DIAGNOSIS — I10 Essential (primary) hypertension: Secondary | ICD-10-CM | POA: Diagnosis not present

## 2022-06-10 DIAGNOSIS — I129 Hypertensive chronic kidney disease with stage 1 through stage 4 chronic kidney disease, or unspecified chronic kidney disease: Secondary | ICD-10-CM | POA: Diagnosis not present

## 2022-06-10 DIAGNOSIS — E785 Hyperlipidemia, unspecified: Secondary | ICD-10-CM | POA: Diagnosis not present

## 2022-06-10 DIAGNOSIS — Z79899 Other long term (current) drug therapy: Secondary | ICD-10-CM | POA: Diagnosis not present

## 2022-06-10 DIAGNOSIS — Z886 Allergy status to analgesic agent status: Secondary | ICD-10-CM | POA: Diagnosis not present

## 2022-06-10 DIAGNOSIS — Z79890 Hormone replacement therapy (postmenopausal): Secondary | ICD-10-CM | POA: Diagnosis not present

## 2022-06-10 DIAGNOSIS — R32 Unspecified urinary incontinence: Secondary | ICD-10-CM | POA: Diagnosis not present

## 2022-06-10 DIAGNOSIS — H00036 Abscess of eyelid left eye, unspecified eyelid: Secondary | ICD-10-CM | POA: Diagnosis not present

## 2022-06-10 DIAGNOSIS — R609 Edema, unspecified: Secondary | ICD-10-CM | POA: Diagnosis not present

## 2022-06-10 DIAGNOSIS — L89152 Pressure ulcer of sacral region, stage 2: Secondary | ICD-10-CM | POA: Diagnosis not present

## 2022-06-10 DIAGNOSIS — H00035 Abscess of left lower eyelid: Secondary | ICD-10-CM | POA: Diagnosis not present

## 2022-06-10 DIAGNOSIS — Z880 Allergy status to penicillin: Secondary | ICD-10-CM | POA: Diagnosis not present

## 2022-06-11 ENCOUNTER — Ambulatory Visit (INDEPENDENT_AMBULATORY_CARE_PROVIDER_SITE_OTHER): Payer: Medicare Other | Admitting: *Deleted

## 2022-06-11 DIAGNOSIS — Z8673 Personal history of transient ischemic attack (TIA), and cerebral infarction without residual deficits: Secondary | ICD-10-CM | POA: Diagnosis not present

## 2022-06-11 DIAGNOSIS — Z794 Long term (current) use of insulin: Secondary | ICD-10-CM | POA: Diagnosis not present

## 2022-06-11 DIAGNOSIS — H00035 Abscess of left lower eyelid: Secondary | ICD-10-CM | POA: Diagnosis not present

## 2022-06-11 DIAGNOSIS — I70202 Unspecified atherosclerosis of native arteries of extremities, left leg: Secondary | ICD-10-CM | POA: Diagnosis not present

## 2022-06-11 DIAGNOSIS — I129 Hypertensive chronic kidney disease with stage 1 through stage 4 chronic kidney disease, or unspecified chronic kidney disease: Secondary | ICD-10-CM | POA: Diagnosis not present

## 2022-06-11 DIAGNOSIS — I10 Essential (primary) hypertension: Secondary | ICD-10-CM

## 2022-06-11 DIAGNOSIS — E782 Mixed hyperlipidemia: Secondary | ICD-10-CM | POA: Diagnosis not present

## 2022-06-11 DIAGNOSIS — Z8616 Personal history of COVID-19: Secondary | ICD-10-CM | POA: Diagnosis not present

## 2022-06-11 DIAGNOSIS — M4800 Spinal stenosis, site unspecified: Secondary | ICD-10-CM | POA: Diagnosis not present

## 2022-06-11 DIAGNOSIS — M1712 Unilateral primary osteoarthritis, left knee: Secondary | ICD-10-CM | POA: Diagnosis not present

## 2022-06-11 DIAGNOSIS — I2510 Atherosclerotic heart disease of native coronary artery without angina pectoris: Secondary | ICD-10-CM | POA: Diagnosis not present

## 2022-06-11 DIAGNOSIS — R197 Diarrhea, unspecified: Secondary | ICD-10-CM | POA: Diagnosis not present

## 2022-06-11 DIAGNOSIS — F02811 Dementia in other diseases classified elsewhere, unspecified severity, with agitation: Secondary | ICD-10-CM | POA: Diagnosis not present

## 2022-06-11 DIAGNOSIS — E1122 Type 2 diabetes mellitus with diabetic chronic kidney disease: Secondary | ICD-10-CM | POA: Diagnosis not present

## 2022-06-11 DIAGNOSIS — R32 Unspecified urinary incontinence: Secondary | ICD-10-CM | POA: Diagnosis not present

## 2022-06-11 DIAGNOSIS — Z7902 Long term (current) use of antithrombotics/antiplatelets: Secondary | ICD-10-CM | POA: Diagnosis not present

## 2022-06-11 DIAGNOSIS — E1151 Type 2 diabetes mellitus with diabetic peripheral angiopathy without gangrene: Secondary | ICD-10-CM | POA: Diagnosis not present

## 2022-06-11 DIAGNOSIS — G40209 Localization-related (focal) (partial) symptomatic epilepsy and epileptic syndromes with complex partial seizures, not intractable, without status epilepticus: Secondary | ICD-10-CM | POA: Diagnosis not present

## 2022-06-11 DIAGNOSIS — D631 Anemia in chronic kidney disease: Secondary | ICD-10-CM | POA: Diagnosis not present

## 2022-06-11 DIAGNOSIS — N184 Chronic kidney disease, stage 4 (severe): Secondary | ICD-10-CM | POA: Diagnosis not present

## 2022-06-11 DIAGNOSIS — E1142 Type 2 diabetes mellitus with diabetic polyneuropathy: Secondary | ICD-10-CM | POA: Diagnosis not present

## 2022-06-11 DIAGNOSIS — Z8631 Personal history of diabetic foot ulcer: Secondary | ICD-10-CM | POA: Diagnosis not present

## 2022-06-11 DIAGNOSIS — Z79891 Long term (current) use of opiate analgesic: Secondary | ICD-10-CM | POA: Diagnosis not present

## 2022-06-11 DIAGNOSIS — I872 Venous insufficiency (chronic) (peripheral): Secondary | ICD-10-CM | POA: Diagnosis not present

## 2022-06-11 DIAGNOSIS — M479 Spondylosis, unspecified: Secondary | ICD-10-CM | POA: Diagnosis not present

## 2022-06-11 DIAGNOSIS — L89152 Pressure ulcer of sacral region, stage 2: Secondary | ICD-10-CM | POA: Diagnosis not present

## 2022-06-11 DIAGNOSIS — E1121 Type 2 diabetes mellitus with diabetic nephropathy: Secondary | ICD-10-CM

## 2022-06-11 DIAGNOSIS — E039 Hypothyroidism, unspecified: Secondary | ICD-10-CM | POA: Diagnosis not present

## 2022-06-11 NOTE — Patient Instructions (Signed)
Visit Information  Thank you for taking time to visit with me today. Please don't hesitate to contact me if I can be of assistance to you before our next scheduled telephone appointment.  Following are the goals we discussed today:  Take medications as prescribed   Attend all scheduled provider appointments Call pharmacy for medication refills 3-7 days in advance of running out of medications Call provider office for new concerns or questions  check blood sugar at prescribed times: twice daily check feet daily for cuts, sores or redness enter blood sugar readings and medication or insulin into daily log take the blood sugar log to all doctor visits take the blood sugar meter to all doctor visits trim toenails straight across drink 6 to 8 glasses of water each day fill half of plate with vegetables keep feet up while sitting wash and dry feet carefully every day wear comfortable, cotton socks wear comfortable, well-fitting shoes check blood pressure weekly choose a place to take my blood pressure (home, clinic or office, retail store) write blood pressure results in a log or diary learn about high blood pressure keep all doctor appointments take medications for blood pressure exactly as prescribed eat more whole grains, fruits and vegetables, lean meats and healthy fats Follow low sodium diet, limit fast food Continue working with home health nurse for wound care Change positions every 2 hours to prevent skin breakdown and promote healing Eat a well balanced diet for wound healing Tips for everyday care of patients with dementia/ behavioral issues: keep a routine, such as bathing dressing eating at the same time each day, plan activities that the person enjoys and try to do them at the same time each day, serve meals in a consistent, familiar place and give patient enough time to eat, encourage use of loose-fitting, comfortable, easy to use clothing such as, elastic waistbands, fabric  fasteners, or large zipper pulls instead of shoelaces, buttons, or buckles.  Our next appointment is by telephone on 08/06/22 at 945 am  Please call the care guide team at [phone] if you need to cancel or reschedule your appointment.   If you are experiencing a Mental Health or [place] or need someone to talk to, please call the Suicide and Crisis Lifeline: 988 call the [place] National Suicide Prevention Lifeline: (513) 708-3339 or TTY: 2896703334 TTY (817)273-8281) to talk to a trained counselor call 1-800-273-TALK (toll free, 24 hour hotline) go to [facility] [address] [place] [phone]) call the [place]: [phone] call 911   The patient verbalized understanding of instructions, educational materials, and care plan provided today and DECLINED offer to receive copy of patient instructions, educational materials, and care plan.   Jacqlyn Larsen RNC, BSN RN Case Manager [facility] [phone] Pressure Injury  A pressure injury is damage to the skin and underlying tissue that results from pressure being applied to an area of the body. It often affects people who must spend a long time in a bed or chair because of a medical condition. Pressure injuries usually occur: Over bony parts of the body, such as the tailbone, shoulders, elbows, hips, heels, spine, ankles, and back of the head. Under medical devices that make contact with the body, such as respiratory equipment, stockings, tubes, and splints. Pressure injuries start as reddened areas on the skin and can lead to pain and an open wound. What are the causes? This condition is caused by frequent or constant pressure to  an area of the body. Decreased blood flow to the skin can eventually cause the skin tissue to die and break down, causing a wound. What increases the risk? You are more likely to develop this condition if you: Are  in the hospital or an extended care facility. Are bedridden or in a wheelchair. Have an injury or disease that keeps you from: Moving normally. Feeling pain or pressure. Have a condition that: Makes you sleepy or less alert. Causes poor blood flow. Need to wear a medical device. Have poor control of your bladder or bowel functions (incontinence). Have poor nutrition (malnutrition). If you are at risk for pressure injuries, your health care provider may recommend certain types of mattresses, mattress covers, pillows, cushions, or boots to help prevent them. These may include products filled with air, foam, gel, or sand. What are the signs or symptoms? Symptoms of this condition depend on the severity of the injury. Symptoms may include: Red or dark areas of the skin. Pain, warmth, or a change of skin texture. Blisters. An open wound. How is this diagnosed? This condition is diagnosed with a medical history and physical exam. You may also have tests, such as: Blood tests. Imaging tests. Blood flow tests. Your pressure injury will be staged based on its severity. Staging is based on: The depth of the tissue injury, including whether there is exposure of muscle, bone, or tendon. The cause of the pressure injury. How is this treated? This condition may be treated by: Relieving or redistributing pressure on your skin. This includes: Frequently changing your position. Avoiding positions that caused the wound or that can make the wound worse. Using specific bed mattresses, chair cushions, or protective boots. Moving medical devices from an area of pressure, or placing padding between the skin and the device. Using foams, creams, or powders to prevent rubbing (friction) on the skin. Keeping your skin clean and dry. This may include using a skin cleanser or skin barrier as told by your health care provider. Cleaning your injury and removing any dead tissue from the wound  (debridement). Placing a bandage (dressing) over your injury. Using medicines for pain or to prevent or treat infection. Surgery may be needed if other treatments are not working or if your injury is very deep. Follow these instructions at home: Wound care Follow instructions from your health care provider about how to take care of your wound. Make sure you: Wash your hands with soap and water before and after you change your bandage (dressing). If soap and water are not available, use hand sanitizer. Change your dressing as told by your health care provider.  Check your wound every day for signs of infection. Have a caregiver do this for you if you are not able. Check for: Redness, swelling, or increased pain. More fluid or blood. Warmth. Pus or a bad smell. Skin care Keep your skin clean and dry. Gently pat your skin dry. Do not rub or massage your skin. You or a caregiver should check your skin every day for any changes in color or any new blisters or sores (ulcers). Medicines Take over-the-counter and prescription medicines only as told by your health care provider. If you were prescribed an antibiotic medicine, take or apply it as told by your health care provider. Do not stop using the antibiotic even if your condition improves. Reducing and redistributing pressure Do not lie or sit in one position for a long time. Move or change position every 1-2 hours, or as  told by your health care provider. Use pillows or cushions to reduce pressure. Ask your health care provider to recommend cushions or pads for you. General instructions  Eat a healthy diet that includes lots of protein. Drink enough fluid to keep your urine pale yellow. Be as active as you can every day. Ask your health care provider to suggest safe exercises or activities. Do not abuse drugs or alcohol. Do not use any products that contain nicotine or tobacco, such as cigarettes, e-cigarettes, and chewing tobacco. If you  need help quitting, ask your health care provider. Keep all follow-up visits as told by your health care provider. This is important. Contact a health care provider if: You have: A fever or chills. Pain that is not helped by medicine. Any changes in skin color. New blisters or sores. Pus or a bad smell coming from your wound. Redness, swelling, or pain around your wound. More fluid or blood coming from your wound. Your wound does not improve after 1-2 weeks of treatment. Summary A pressure injury is damage to the skin and underlying tissue that results from pressure being applied to an area of the body. Do not lie or sit in one position for a long time. Your health care provider may advise you to move or change position every 1-2 hours. Follow instructions from your health care provider about how to take care of your wound. Keep all follow-up visits as told by your health care provider. This is important. This information is not intended to replace advice given to you by your health care provider. Make sure you discuss any questions you have with your health care provider. Document Revised: 09/18/2021 Document Reviewed: 09/18/2021 Elsevier Patient Education  [place].

## 2022-06-11 NOTE — Chronic Care Management (AMB) (Signed)
Chronic Care Management   CCM RN Visit Note  06/11/2022 Name: Chase [name] MRN: [id number] DOB: May 22, 1931  Subjective: Chase [name] is a 88 y.o. year old male who is a primary care patient of Moshe Cipro Norwood Levo, MD. The care management team was consulted for assistance with disease management and care coordination needs.    Engaged with patient by telephone for follow up visit in response to provider referral for case management and/or care coordination services.   Consent to Services:  The patient was given information about Chronic Care Management services, agreed to services, and gave verbal consent prior to initiation of services.  Please see initial visit note for detailed documentation.   Patient agreed to services and verbal consent obtained.   Assessment: Review of patient past medical history, allergies, medications, health status, including review of consultants reports, laboratory and other test data, was performed as part of comprehensive evaluation and provision of chronic care management services.   SDOH (Social Determinants of Health) assessments and interventions performed:    CCM Care Plan  Allergies  Allergen Reactions   Bayer Advanced Aspirin [Aspirin] Nausea And Vomiting   Penicillins Nausea And Vomiting    Has patient had a PCN reaction causing immediate rash, facial/tongue/throat swelling, SOB or lightheadedness with hypotension: unknown Has patient had a PCN reaction causing severe rash involving mucus membranes or skin necrosis: unknown Has patient had a PCN reaction that required hospitalization: unknown Has patient had a PCN reaction occurring within the last 10 years: no If all of the above answers are "NO", then may proceed with Cephalosporin use.    Outpatient Encounter Medications as of 06/11/2022  Medication Sig   amLODipine (NORVASC) 10 MG tablet TAKE 1 TABLET DAILY   calcium carbonate (TUMS - DOSED IN MG ELEMENTAL CALCIUM) 500 MG chewable tablet  Chew 0.5 tablets by mouth 2 (two) times daily.   clopidogrel (PLAVIX) 75 MG tablet TAKE 1 TABLET DAILY   diphenoxylate-atropine (LOMOTIL) 2.5-0.025 MG tablet Take one tablet by mouth once daily as needed, for diarrheah   donepezil (ARICEPT) 5 MG tablet Take 5 mg by mouth daily.   famotidine (PEPCID) 20 MG tablet Take 20 mg by mouth 2 (two) times daily.   hydrALAZINE (APRESOLINE) 25 MG tablet Take 25 mg by mouth in the morning and at bedtime.   hydrOXYzine (ATARAX) 10 MG tablet Take one tablet by mouth once daily, as needed, for agitiation   Insulin Pen Needle (B-D ULTRAFINE III SHORT PEN) 31G X 8 MM MISC 1 each by Does not apply route as directed.   levETIRAcetam (KEPPRA) 100 MG/ML solution Take 500 mg by mouth daily.   levothyroxine (SYNTHROID) 112 MCG tablet TAKE 1 TABLET DAILY BEFORE BREAKFAST   Multiple Vitamins-Minerals (MULTIVITAMIN WITH MINERALS) tablet Take 1 tablet by mouth daily.   olopatadine (PATANOL) 0.1 % ophthalmic solution INSTILL 1 DROP INTO EACH EYE TWICE DAILY   ondansetron (ZOFRAN-ODT) 8 MG disintegrating tablet Take 8 mg by mouth every 8 (eight) hours as needed.   OneTouch Delica Lancets 09X MISC 1 each by Other route 2 (two) times daily.   ONETOUCH ULTRA test strip USE 1 STRIP THREE TIMES A DAY AS INSTRUCTED   polyethylene glycol powder (GLYCOLAX/MIRALAX) 17 GM/SCOOP powder Take 17 g by mouth daily. (Patient taking differently: Take 17 g by mouth daily as needed.)   pravastatin (PRAVACHOL) 20 MG tablet TAKE 1 TABLET DAILY   sodium bicarbonate 650 MG tablet Take 650 mg by mouth 3 (three) times daily.  tamsulosin (FLOMAX) 0.4 MG CAPS capsule TAKE 1 CAPSULE DAILY   No facility-administered encounter medications on file as of 06/11/2022.    Patient Active Problem List   Diagnosis Date Noted   Sacral ulcer ([place]) 05/06/2022   Delusional disorder ([place]) 03/16/2022   Aggressive behavior 03/16/2022   Poor appetite 03/16/2022   COVID-19 08/08/2021   Epilepsy ([place]) 11/07/2020    Polyneuropathy due to type 2 diabetes mellitus ([place]) 11/07/2020   Atherosclerosis of native artery of left lower extremity with ulceration of ankle ([place]) 08/23/2020   Loose stools 10/28/2019   Incontinence 07/08/2019   Osteoarthritis of left knee 04/15/2019   Diarrhea 04/15/2019   Common bile duct stone 04/18/2018   Cholecystocutaneous fistula    Physical deconditioning 02/28/2018   Choledocholithiasis 02/25/2018   Hyperkalemia 02/77/4128   Metabolic bone disease 78/67/6720   Fistula of bile duct 02/14/2018   Dermatitis 02/25/2017   Essential hypertension, benign 12/13/2015   RBBB (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 fascicular block)    Hypertensive heart disease 12/01/2014   Chronic venous insufficiency 11/16/2014   Carotid artery disease ([place]) 05/18/2014   Poor balance 09/11/2013   Difficulty walking 09/11/2013   Hx of falling 09/11/2013   History of stroke 09/07/2012   CKD (chronic kidney disease) stage 4, GFR 15-29 ml/min (HCC) 09/07/2012   Anemia of chronic disease 09/07/2012   Cholelithiasis 08/10/2012   Peripheral autonomic neuropathy due to diabetes mellitus ([place]) 03/14/2012   Seizure disorder, complex partial ([place]) 03/07/2012   Type 2 diabetes mellitus with diabetic nephropathy ([place])    Hypothyroidism 03/09/2008   Mixed hyperlipidemia 03/09/2008   Renovascular hypertension 03/09/2008   Peripheral vascular disease ([place]) 03/09/2008   DEGENERATIVE JOINT DISEASE, KNEE 01/26/2008   Osteoarthritis of spine 01/26/2008   Spinal stenosis     Conditions to be addressed/monitored:HTN and DMII  Care Plan : RN Care Manager Plan of Care  Updates made by Kassie Mends, RN since 06/11/2022 12:00 AM     Problem: No plan of care established for management of chronic disease state  (DM2, HTN, CKD stage 4)   Priority: High     Long-Range Goal: Development of plan of care for chronic disease management  (DM2, HTN, CKD stage 4)   Start Date: 03/04/2022  Expected End  Date: 08/31/2022  Priority: High  Note:   Current Barriers:  Knowledge Deficits related to plan of care for management of HTN and DMII  No Advanced Directives in place- family requests information be mailed Spoke with spouse [name] (permission given by pt), reports pt requires assistance with ADL's and IADL's (such as bathing, mobility, transportation, medication oversight, etc.) Pt is on grant program through [employer] and has aide M-F 8 am- 12 noon. CG reports received resources for wheelchair ramp, home modifications and transportation, patient did receive new wheelchair, reports they are still having increasing difficulty with getting pt out of the house and into the car, daughters are assisting. Spouse reports  CBG now monitored with [employer] [facility] [facility] since end of March, reports  FBS ranges low 100's - 114-132 over the past week, random ranges 200's, pt does not follow a special diet. Blood pressure is not monitored. Spouse reports Dr. Merlene Laughter handling any issues with dementia, reports patient has home health nurse several times weekly to manage 2 pressure ulcers on buttocks.  RNCM Clinical Goal(s):  Patient will verbalize understanding of plan for management of HTN and DMII as evidenced by caregiver report, review of EHR and  through  collaboration with Consulting civil engineer, provider, and care team.   Interventions: 1:1 collaboration with primary care provider regarding development and update of comprehensive plan of care as evidenced by provider attestation and co-signature Inter-disciplinary care team collaboration (see longitudinal plan of care) Evaluation of current treatment plan related to  self management and patient's adherence to plan as established by provider   Diabetes Interventions:  (Status:  New goal. and Goal on track:  Yes.) Long Term Goal Assessed patient's understanding of A1c goal: <7% Reviewed medications with patient and discussed importance of medication adherence Review of  patient status, including review of consultants reports, relevant laboratory and other test results, and medications completed Reviewed carbohydrate modified diet Reviewed importance of well balanced diet to promote healing Reviewed importance of changing positions q 2 hours to prevent skin breakdown and promote healing Lab Results  Component Value Date   HGBA1C 5.7 10/06/2021   Hypertension Interventions:  (Status:  New goal. and Goal on track:  Yes.) Long Term Goal Last practice recorded BP readings:  BP Readings from Last 3 Encounters:  02/04/22 136/70  02/04/22 (!) 156/78  10/09/21 (!) 144/65  Most recent eGFR/CrCl:  Lab Results  Component Value Date   EGFR 15 (L) 03/27/2021    No components found for: CRCL  Evaluation of current treatment plan related to hypertension self management and patient's adherence to plan as established by provider Reviewed medications with patient and discussed importance of compliance Advised patient, providing education and rationale, to monitor blood pressure daily and record, calling PCP for findings outside established parameters Reviewed importance of continuing to work with home health nurse  Reinforced safety precautions Reinforced tips for everyday care of patients with dementia/ behavioral issues Reviewed low sodium diet  Patient Goals/Self-Care Activities: Take medications as prescribed   Attend all scheduled provider appointments Call pharmacy for medication refills 3-7 days in advance of running out of medications Call provider office for new concerns or questions  check blood sugar at prescribed times: twice daily check feet daily for cuts, sores or redness enter blood sugar readings and medication or insulin into daily log take the blood sugar log to all doctor visits take the blood sugar meter to all doctor visits trim toenails straight across drink 6 to 8 glasses of water each day fill half of plate with vegetables keep feet up  while sitting wash and dry feet carefully every day wear comfortable, cotton socks wear comfortable, well-fitting shoes check blood pressure weekly choose a place to take my blood pressure (home, clinic or office, retail store) write blood pressure results in a log or diary learn about high blood pressure keep all doctor appointments take medications for blood pressure exactly as prescribed eat more whole grains, fruits and vegetables, lean meats and healthy fats Follow low sodium diet, limit fast food Continue working with home health nurse for wound care Change positions every 2 hours to prevent skin breakdown and promote healing Eat a well balanced diet for wound healing Tips for everyday care of patients with dementia/ behavioral issues: keep a routine, such as bathing dressing eating at the same time each day, plan activities that the person enjoys and try to do them at the same time each day, serve meals in a consistent, familiar place and give patient enough time to eat, encourage use of loose-fitting, comfortable, easy to use clothing such as, elastic waistbands, fabric fasteners, or large zipper pulls instead of shoelaces, buttons, or buckles.  Plan:Telephone follow up appointment with care management team member scheduled for:  08/06/22 at 63 am  Jacqlyn Larsen [facility], BSN RN Case Manager [facility] [phone]

## 2022-06-12 DIAGNOSIS — Z01818 Encounter for other preprocedural examination: Secondary | ICD-10-CM | POA: Diagnosis not present

## 2022-06-12 DIAGNOSIS — H04553 Acquired stenosis of bilateral nasolacrimal duct: Secondary | ICD-10-CM | POA: Diagnosis not present

## 2022-06-12 DIAGNOSIS — L0291 Cutaneous abscess, unspecified: Secondary | ICD-10-CM | POA: Diagnosis not present

## 2022-06-15 DIAGNOSIS — F02811 Dementia in other diseases classified elsewhere, unspecified severity, with agitation: Secondary | ICD-10-CM | POA: Diagnosis not present

## 2022-06-15 DIAGNOSIS — I129 Hypertensive chronic kidney disease with stage 1 through stage 4 chronic kidney disease, or unspecified chronic kidney disease: Secondary | ICD-10-CM | POA: Diagnosis not present

## 2022-06-15 DIAGNOSIS — E039 Hypothyroidism, unspecified: Secondary | ICD-10-CM | POA: Diagnosis not present

## 2022-06-15 DIAGNOSIS — D631 Anemia in chronic kidney disease: Secondary | ICD-10-CM | POA: Diagnosis not present

## 2022-06-15 DIAGNOSIS — N184 Chronic kidney disease, stage 4 (severe): Secondary | ICD-10-CM | POA: Diagnosis not present

## 2022-06-15 DIAGNOSIS — R197 Diarrhea, unspecified: Secondary | ICD-10-CM | POA: Diagnosis not present

## 2022-06-15 DIAGNOSIS — L89152 Pressure ulcer of sacral region, stage 2: Secondary | ICD-10-CM | POA: Diagnosis not present

## 2022-06-15 DIAGNOSIS — R32 Unspecified urinary incontinence: Secondary | ICD-10-CM | POA: Diagnosis not present

## 2022-06-16 DIAGNOSIS — R197 Diarrhea, unspecified: Secondary | ICD-10-CM | POA: Diagnosis not present

## 2022-06-16 DIAGNOSIS — F02811 Dementia in other diseases classified elsewhere, unspecified severity, with agitation: Secondary | ICD-10-CM | POA: Diagnosis not present

## 2022-06-16 DIAGNOSIS — R32 Unspecified urinary incontinence: Secondary | ICD-10-CM | POA: Diagnosis not present

## 2022-06-16 DIAGNOSIS — L89152 Pressure ulcer of sacral region, stage 2: Secondary | ICD-10-CM | POA: Diagnosis not present

## 2022-06-16 DIAGNOSIS — E039 Hypothyroidism, unspecified: Secondary | ICD-10-CM | POA: Diagnosis not present

## 2022-06-16 DIAGNOSIS — I129 Hypertensive chronic kidney disease with stage 1 through stage 4 chronic kidney disease, or unspecified chronic kidney disease: Secondary | ICD-10-CM | POA: Diagnosis not present

## 2022-06-19 DIAGNOSIS — I129 Hypertensive chronic kidney disease with stage 1 through stage 4 chronic kidney disease, or unspecified chronic kidney disease: Secondary | ICD-10-CM | POA: Diagnosis not present

## 2022-06-19 DIAGNOSIS — F02811 Dementia in other diseases classified elsewhere, unspecified severity, with agitation: Secondary | ICD-10-CM | POA: Diagnosis not present

## 2022-06-19 DIAGNOSIS — L89152 Pressure ulcer of sacral region, stage 2: Secondary | ICD-10-CM | POA: Diagnosis not present

## 2022-06-19 DIAGNOSIS — R197 Diarrhea, unspecified: Secondary | ICD-10-CM | POA: Diagnosis not present

## 2022-06-19 DIAGNOSIS — R32 Unspecified urinary incontinence: Secondary | ICD-10-CM | POA: Diagnosis not present

## 2022-06-19 DIAGNOSIS — E039 Hypothyroidism, unspecified: Secondary | ICD-10-CM | POA: Diagnosis not present

## 2022-06-25 DIAGNOSIS — R32 Unspecified urinary incontinence: Secondary | ICD-10-CM | POA: Diagnosis not present

## 2022-06-25 DIAGNOSIS — R197 Diarrhea, unspecified: Secondary | ICD-10-CM | POA: Diagnosis not present

## 2022-06-25 DIAGNOSIS — L89152 Pressure ulcer of sacral region, stage 2: Secondary | ICD-10-CM | POA: Diagnosis not present

## 2022-06-25 DIAGNOSIS — I129 Hypertensive chronic kidney disease with stage 1 through stage 4 chronic kidney disease, or unspecified chronic kidney disease: Secondary | ICD-10-CM | POA: Diagnosis not present

## 2022-06-25 DIAGNOSIS — F02811 Dementia in other diseases classified elsewhere, unspecified severity, with agitation: Secondary | ICD-10-CM | POA: Diagnosis not present

## 2022-06-25 DIAGNOSIS — E039 Hypothyroidism, unspecified: Secondary | ICD-10-CM | POA: Diagnosis not present

## 2022-06-26 ENCOUNTER — Other Ambulatory Visit: Payer: Self-pay

## 2022-06-26 DIAGNOSIS — R5381 Other malaise: Secondary | ICD-10-CM

## 2022-06-29 ENCOUNTER — Other Ambulatory Visit: Payer: Self-pay | Admitting: Nurse Practitioner

## 2022-06-29 DIAGNOSIS — F02811 Dementia in other diseases classified elsewhere, unspecified severity, with agitation: Secondary | ICD-10-CM | POA: Diagnosis not present

## 2022-06-29 DIAGNOSIS — E038 Other specified hypothyroidism: Secondary | ICD-10-CM

## 2022-06-29 DIAGNOSIS — R197 Diarrhea, unspecified: Secondary | ICD-10-CM | POA: Diagnosis not present

## 2022-06-29 DIAGNOSIS — E039 Hypothyroidism, unspecified: Secondary | ICD-10-CM | POA: Diagnosis not present

## 2022-06-29 DIAGNOSIS — I129 Hypertensive chronic kidney disease with stage 1 through stage 4 chronic kidney disease, or unspecified chronic kidney disease: Secondary | ICD-10-CM | POA: Diagnosis not present

## 2022-06-29 DIAGNOSIS — R32 Unspecified urinary incontinence: Secondary | ICD-10-CM | POA: Diagnosis not present

## 2022-06-29 DIAGNOSIS — L89152 Pressure ulcer of sacral region, stage 2: Secondary | ICD-10-CM | POA: Diagnosis not present

## 2022-07-06 DIAGNOSIS — E1121 Type 2 diabetes mellitus with diabetic nephropathy: Secondary | ICD-10-CM

## 2022-07-06 DIAGNOSIS — I10 Essential (primary) hypertension: Secondary | ICD-10-CM

## 2022-07-06 DIAGNOSIS — Z794 Long term (current) use of insulin: Secondary | ICD-10-CM | POA: Diagnosis not present

## 2022-07-09 ENCOUNTER — Telehealth: Payer: Self-pay | Admitting: Family Medicine

## 2022-07-09 DIAGNOSIS — I129 Hypertensive chronic kidney disease with stage 1 through stage 4 chronic kidney disease, or unspecified chronic kidney disease: Secondary | ICD-10-CM | POA: Diagnosis not present

## 2022-07-09 DIAGNOSIS — R32 Unspecified urinary incontinence: Secondary | ICD-10-CM | POA: Diagnosis not present

## 2022-07-09 DIAGNOSIS — L89152 Pressure ulcer of sacral region, stage 2: Secondary | ICD-10-CM | POA: Diagnosis not present

## 2022-07-09 DIAGNOSIS — F02811 Dementia in other diseases classified elsewhere, unspecified severity, with agitation: Secondary | ICD-10-CM | POA: Diagnosis not present

## 2022-07-09 DIAGNOSIS — R197 Diarrhea, unspecified: Secondary | ICD-10-CM | POA: Diagnosis not present

## 2022-07-09 DIAGNOSIS — E039 Hypothyroidism, unspecified: Secondary | ICD-10-CM | POA: Diagnosis not present

## 2022-07-09 NOTE — Telephone Encounter (Signed)
Care Coordinator with [facility] called stating pt daughter did a self referral on pt. She wants to know if you can please send over records on him and if you also wanted to do a referral?    [place] [phone]

## 2022-07-10 DIAGNOSIS — R197 Diarrhea, unspecified: Secondary | ICD-10-CM | POA: Diagnosis not present

## 2022-07-10 DIAGNOSIS — L89152 Pressure ulcer of sacral region, stage 2: Secondary | ICD-10-CM | POA: Diagnosis not present

## 2022-07-10 DIAGNOSIS — D631 Anemia in chronic kidney disease: Secondary | ICD-10-CM | POA: Diagnosis not present

## 2022-07-10 DIAGNOSIS — I129 Hypertensive chronic kidney disease with stage 1 through stage 4 chronic kidney disease, or unspecified chronic kidney disease: Secondary | ICD-10-CM | POA: Diagnosis not present

## 2022-07-10 DIAGNOSIS — R32 Unspecified urinary incontinence: Secondary | ICD-10-CM | POA: Diagnosis not present

## 2022-07-10 DIAGNOSIS — N184 Chronic kidney disease, stage 4 (severe): Secondary | ICD-10-CM | POA: Diagnosis not present

## 2022-07-10 DIAGNOSIS — E039 Hypothyroidism, unspecified: Secondary | ICD-10-CM | POA: Diagnosis not present

## 2022-07-10 DIAGNOSIS — F02811 Dementia in other diseases classified elsewhere, unspecified severity, with agitation: Secondary | ICD-10-CM | POA: Diagnosis not present

## 2022-07-14 ENCOUNTER — Telehealth: Payer: Medicare Other

## 2022-07-15 ENCOUNTER — Telehealth (INDEPENDENT_AMBULATORY_CARE_PROVIDER_SITE_OTHER): Payer: Medicare Other | Admitting: Family Medicine

## 2022-07-15 ENCOUNTER — Encounter: Payer: Self-pay | Admitting: Family Medicine

## 2022-07-15 DIAGNOSIS — E782 Mixed hyperlipidemia: Secondary | ICD-10-CM

## 2022-07-15 DIAGNOSIS — I639 Cerebral infarction, unspecified: Secondary | ICD-10-CM

## 2022-07-15 DIAGNOSIS — F03B3 Unspecified dementia, moderate, with mood disturbance: Secondary | ICD-10-CM

## 2022-07-15 DIAGNOSIS — R2689 Other abnormalities of gait and mobility: Secondary | ICD-10-CM

## 2022-07-15 DIAGNOSIS — R32 Unspecified urinary incontinence: Secondary | ICD-10-CM

## 2022-07-15 NOTE — Telephone Encounter (Signed)
Virtual video call scheduled for 08.09.2023 at 11:40 am.  Spoke to [name] and [place] request video to see [place].  Journey Ratterman Nimmons (daughter), Vickki Hearing (daughter) and Alexandria Current (son) are the only ones listed on DPR.  No hPOA found.

## 2022-07-15 NOTE — Patient Instructions (Addendum)
F/U end August as  before, call if you need me sooner  We will reach out directly to palliative care to see if this will adequately cover the needs discussed, as in supply of incontinence supplies, mattress protectors/ chux As well as reliable daily in home assistance as far as bathing, dressing and moving Chase [name] from bed to chair in the morning and again  in  the evenings around 6 pm.   Nurse will contact Chase [name] this afternoon to give an update.  Nurse please arrange for sister who  is not on DPR to be added, Chase [name] will provide her name and contact info, not sure if  she will need to come to office to have this done, please address  If Palliative care unable to provide services then I will request that you receive information about hospice care for review prior to referring Chase [name] to hospice  Thanks for choosing [facility], we consider it a privelige to serve you.

## 2022-07-15 NOTE — Progress Notes (Signed)
Virtual Visit via Video Note  I connected with Chase [name] on 07/15/22 at 11:40 AM EDT by a video enabled telemedicine application and verified that I am speaking with the correct person using two identifiers.His wife and daughter Chase [name] also participated and in fact were more verbal than Chase [name] as his communication is limited by neurologic illness, worsening dementia  Location: Patient: home Provider: office   I discussed the limitations of evaluation and management by telemedicine and the availability of in person appointments. The patient expressed understanding and agreed to proceed.  History of Present Illness: Spoke with daughter Chase [name] who is on dPR, outreach is for in home assistance with daily care for Chase [name] due o to increased debility, needs hellp with aDL's and can only feed myself ,  Currenly getting help from 8 to 73 which is coovered by his  insurance , Sister, Chase [name] was able to get this, through aging and disability Feels that palliative care will be better able to provide needed services as in daily, and someone more able to do the job. Requesting/ needing help in the evenings also to help to get Chase [name] back in the bed around 6 pm What about the matress, ordered 3 months ago, still waiting on that Requesting that Chux and diapers, be delivered and covered by ins   Observations/Objective: There were no vitals taken for this visit. Patient able to wave a greeting and states "I love you ", incapable of providing a history  Assessment and Plan: Dementia ([place]) Recently started on low dose aricept with onset of aggressive behavior and delusional thoughts, no adverse s/e reported, no dose change due to CKD  Poor balance High fall risk incapable of safe independent ambulation and requires assistance for transferring from bed to chair. Needs 24/7 care , incapable of ADL's outreach is to attempt to address these services on a consistent daily basis with qualified help ,  morning and evening, family is supportive  Type 2 diabetes mellitus with diabetic nephropathy ([place])        Mixed hyperlipidemia Hyperlipidemia:Low fat diet discussed and encouraged.   Lipid Panel  Lab Results  Component Value Date   CHOL 169 03/27/2021   HDL 81 03/27/2021   LDLCALC 80 03/27/2021   TRIG 32 03/27/2021   CHOLHDL 2.1 03/27/2021       Incontinence Incontinent of stool and urine , requires incontinence supplies which are costly will attempt to get this through palliative care   Follow Up Instructions:    I discussed the assessment and treatment plan with the patient. The patient was provided an opportunity to ask questions and all were answered. The patient agreed with the plan and demonstrated an understanding of the instructions.   The patient was advised to call back or seek an in-person evaluation if the symptoms worsen or if the condition fails to improve as anticipated.  I provided 22 minutes of face-to-face time during this encounter.   Tula Nakayama, MD

## 2022-07-19 DIAGNOSIS — F0390 Unspecified dementia without behavioral disturbance: Secondary | ICD-10-CM | POA: Insufficient documentation

## 2022-07-19 NOTE — Assessment & Plan Note (Signed)
Recently started on low dose aricept with onset of aggressive behavior and delusional thoughts, no adverse s/e reported, no dose change due to CKD

## 2022-07-19 NOTE — Assessment & Plan Note (Signed)
Hyperlipidemia:Low fat diet discussed and encouraged.   Lipid Panel  Lab Results  Component Value Date   CHOL 169 03/27/2021   HDL 81 03/27/2021   LDLCALC 80 03/27/2021   TRIG 32 03/27/2021   CHOLHDL 2.1 03/27/2021

## 2022-07-19 NOTE — Assessment & Plan Note (Signed)
High fall risk incapable of safe independent ambulation and requires assistance for transferring from bed to chair. Needs 24/7 care , incapable of ADL's outreach is to attempt to address these services on a consistent daily basis with qualified help , morning and evening, family is supportive

## 2022-07-19 NOTE — Assessment & Plan Note (Signed)
Incontinent of stool and urine , requires incontinence supplies which are costly will attempt to get this through palliative care

## 2022-07-21 ENCOUNTER — Telehealth: Payer: Self-pay

## 2022-07-21 NOTE — Telephone Encounter (Signed)
Attempted to contact patient/patient's spouse Chase [name] to schedule a Palliative Care consult appointment. No answer left a message to return call.

## 2022-07-23 ENCOUNTER — Other Ambulatory Visit: Payer: Self-pay | Admitting: Family Medicine

## 2022-07-23 DIAGNOSIS — Z515 Encounter for palliative care: Secondary | ICD-10-CM | POA: Diagnosis not present

## 2022-07-23 DIAGNOSIS — L89322 Pressure ulcer of left buttock, stage 2: Secondary | ICD-10-CM | POA: Diagnosis not present

## 2022-07-23 DIAGNOSIS — F01B11 Vascular dementia, moderate, with agitation: Secondary | ICD-10-CM | POA: Diagnosis not present

## 2022-07-24 ENCOUNTER — Ambulatory Visit: Payer: Self-pay | Admitting: *Deleted

## 2022-07-24 ENCOUNTER — Encounter: Payer: Self-pay | Admitting: *Deleted

## 2022-07-24 ENCOUNTER — Ambulatory Visit (INDEPENDENT_AMBULATORY_CARE_PROVIDER_SITE_OTHER): Payer: Medicare Other | Admitting: *Deleted

## 2022-07-24 DIAGNOSIS — Z794 Long term (current) use of insulin: Secondary | ICD-10-CM

## 2022-07-24 DIAGNOSIS — I10 Essential (primary) hypertension: Secondary | ICD-10-CM

## 2022-07-24 DIAGNOSIS — E1121 Type 2 diabetes mellitus with diabetic nephropathy: Secondary | ICD-10-CM

## 2022-07-24 NOTE — Chronic Care Management (AMB) (Signed)
Chronic Care Management   CCM RN Visit Note  07/24/2022 Name: Chase [name] MRN: [id number] DOB: [birth year]  Subjective: Chase [name] is a 88 y.o. year old male who is a primary care patient of Chase Cipro Norwood Levo, MD. The care management team was consulted for assistance with disease management and care coordination needs.    Engaged with patient by telephone for follow up visit in response to provider referral for case management and/or care coordination services.   Consent to Services:  The patient was given information about Chronic Care Management services, agreed to services, and gave verbal consent prior to initiation of services.  Please see initial visit note for detailed documentation.   Patient agreed to services and verbal consent obtained.   Assessment: Review of patient past medical history, allergies, medications, health status, including review of consultants reports, laboratory and other test data, was performed as part of comprehensive evaluation and provision of chronic care management services.   SDOH (Social Determinants of Health) assessments and interventions performed:    CCM Care Plan  Allergies  Allergen Reactions   Bayer Advanced Aspirin [Aspirin] Nausea And Vomiting   Penicillins Nausea And Vomiting    Has patient had a PCN reaction causing immediate rash, facial/tongue/throat swelling, SOB or lightheadedness with hypotension: unknown Has patient had a PCN reaction causing severe rash involving mucus membranes or skin necrosis: unknown Has patient had a PCN reaction that required hospitalization: unknown Has patient had a PCN reaction occurring within the last 10 years: no If all of the above answers are "NO", then may proceed with Cephalosporin use.    Outpatient Encounter Medications as of 07/24/2022  Medication Sig   amLODipine (NORVASC) 10 MG tablet TAKE 1 TABLET DAILY   calcium carbonate (TUMS - DOSED IN MG ELEMENTAL CALCIUM) 500 MG chewable  tablet Chew 0.5 tablets by mouth 2 (two) times daily.   clopidogrel (PLAVIX) 75 MG tablet TAKE 1 TABLET DAILY   diphenoxylate-atropine (LOMOTIL) 2.5-0.025 MG tablet Take one tablet by mouth once daily as needed, for diarrheah   donepezil (ARICEPT) 5 MG tablet Take 5 mg by mouth daily.   famotidine (PEPCID) 20 MG tablet Take 20 mg by mouth 2 (two) times daily.   hydrALAZINE (APRESOLINE) 25 MG tablet Take 25 mg by mouth in the morning and at bedtime.   hydrOXYzine (ATARAX) 10 MG tablet Take one tablet by mouth once daily, as needed, for agitiation   Insulin Pen Needle (B-D ULTRAFINE III SHORT PEN) 31G X 8 MM MISC 1 each by Does not apply route as directed.   levETIRAcetam (KEPPRA) 100 MG/ML solution Take 500 mg by mouth daily.   levothyroxine (SYNTHROID) 112 MCG tablet TAKE 1 TABLET DAILY BEFORE BREAKFAST   Multiple Vitamins-Minerals (MULTIVITAMIN WITH MINERALS) tablet Take 1 tablet by mouth daily.   olopatadine (PATANOL) 0.1 % ophthalmic solution INSTILL 1 DROP INTO EACH EYE TWICE DAILY   ondansetron (ZOFRAN-ODT) 8 MG disintegrating tablet Take 8 mg by mouth every 8 (eight) hours as needed.   OneTouch Delica Lancets 75I MISC 1 each by Other route 2 (two) times daily.   ONETOUCH ULTRA test strip USE 1 STRIP THREE TIMES A DAY AS INSTRUCTED   polyethylene glycol powder (GLYCOLAX/MIRALAX) 17 GM/SCOOP powder Take 17 g by mouth daily. (Patient taking differently: Take 17 g by mouth daily as needed.)   pravastatin (PRAVACHOL) 20 MG tablet TAKE 1 TABLET DAILY   sodium bicarbonate 650 MG tablet Take 650 mg by mouth 3 (three) times daily.  tamsulosin (FLOMAX) 0.4 MG CAPS capsule TAKE 1 CAPSULE DAILY   No facility-administered encounter medications on file as of 07/24/2022.    Patient Active Problem List   Diagnosis Date Noted   Dementia ([place]) 07/19/2022   Sacral ulcer ([place]) 05/06/2022   Delusional disorder ([place]) 03/16/2022   Aggressive behavior 03/16/2022   COVID-19 08/08/2021   Epilepsy ([place])  11/07/2020   Atherosclerosis of native artery of left lower extremity with ulceration of ankle ([place]) 08/23/2020   Loose stools 10/28/2019   Incontinence 07/08/2019   Osteoarthritis of left knee 04/15/2019   Diarrhea 04/15/2019   Common bile duct stone 04/18/2018   Cholecystocutaneous fistula    Physical deconditioning 02/28/2018   Choledocholithiasis 02/25/2018   Hyperkalemia 62/02/5596   Metabolic bone disease 41/63/8453   Fistula of bile duct 02/14/2018   Dermatitis 02/25/2017   Essential hypertension, benign 12/13/2015   RBBB (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 fascicular block)    Hypertensive heart disease 12/01/2014   Chronic venous insufficiency 11/16/2014   Carotid artery disease ([place]) 05/18/2014   Poor balance 09/11/2013   Difficulty walking 09/11/2013   Hx of falling 09/11/2013   History of stroke 09/07/2012   CKD (chronic kidney disease) stage 4, GFR 15-29 ml/min (HCC) 09/07/2012   Anemia of chronic disease 09/07/2012   Cholelithiasis 08/10/2012   Peripheral autonomic neuropathy due to diabetes mellitus ([place]) 03/14/2012   Seizure disorder, complex partial ([place]) 03/07/2012   Type 2 diabetes mellitus with diabetic nephropathy ([place])    Hypothyroidism 03/09/2008   Mixed hyperlipidemia 03/09/2008   Renovascular hypertension 03/09/2008   Peripheral vascular disease ([place]) 03/09/2008   DEGENERATIVE JOINT DISEASE, KNEE 01/26/2008   Osteoarthritis of spine 01/26/2008   Spinal stenosis     Conditions to be addressed/monitored:HTN and DMII  Care Plan : RN Care Manager Plan of Care  Updates made by Kassie Mends, RN since 07/24/2022 12:00 AM     Problem: No plan of care established for management of chronic disease state  (DM2, HTN, CKD stage 4)   Priority: High     Long-Range Goal: Development of plan of care for chronic disease management  (DM2, HTN, CKD stage 4)   Start Date: 03/04/2022  Expected End Date: 08/31/2022  Priority: High  Note:   Current  Barriers:  Knowledge Deficits related to plan of care for management of HTN and DMII  No Advanced Directives in place- family requests information be mailed Spoke with spouse [name] (permission given by pt), reports pt requires assistance with ADL's and IADL's (such as bathing, mobility, transportation, medication oversight, etc.) Pt is on grant program through [employer] and has aide M-F 8 am- 12 noon. CG reports received resources for wheelchair ramp, home modifications and transportation, patient did receive new wheelchair, reports they are still having increasing difficulty with getting pt out of the house and into the car, daughters are assisting. Spouse reports  CBG now monitored with [facility] since end of March, reports  FBS ranges low 100's - 114-132 over the past week, random ranges 200's, pt does not follow a special diet. Blood pressure is not monitored. Spouse reports Dr. Merlene Laughter handling any issues with dementia, reports patient has home health nurse several times weekly to manage 2 pressure ulcers on buttocks. 07/24/22- spoke with patient's wife [name] who reports blood sugar "is very good"  FBS ranges 80-83, random ranges 120-189, states has all medications and taking as prescribed, reports Palliative care nurse saw patient in the home 07/23/22 and  awaiting decision about services, states patient's daughter would know more about this, RN care manager to call daughter [name].  Home health has discharged patient, continues getting assistance through grant program through [employer]. 07/24/22- telephone call with patient's daughter [name] who reports Palliative care nurse did see patient and their services are once monthly, [name] feels certain that patient applied for medicaid and was denied, she will check with her sister and make sure and go from there, if patient has not applied they will do this, pt has [employer] 4 hours per day and they would like additional assistance in the home.  RNCM Clinical  Goal(s):  Patient will verbalize understanding of plan for management of HTN and DMII as evidenced by caregiver report, review of EHR and  through collaboration with RN Care manager, provider, and care team.   Interventions: 1:1 collaboration with primary care provider regarding development and update of comprehensive plan of care as evidenced by provider attestation and co-signature Inter-disciplinary care team collaboration (see longitudinal plan of care) Evaluation of current treatment plan related to  self management and patient's adherence to plan as established by provider   Diabetes Interventions:  (Status:  New goal. and Goal on track:  Yes.) Long Term Goal Assessed patient's understanding of A1c goal: <7% Reviewed medications with patient and discussed importance of medication adherence Review of patient status, including review of consultants reports, relevant laboratory and other test results, and medications completed Reinforced carbohydrate modified diet Reinforced importance of well balanced diet to promote healing Reinforced importance of changing positions q 2 hours to prevent skin breakdown and promote healing Reviewed with patient's wife there will be another care manager outreaching patient in September Confirmed with patient's daughter, pt has been placed on services with Palliative care for once monthly outreaches Ask patient's daughter to check and see if patient applied for medicaid (she will check with her sister), if has not applied this will need to be next steps and care management can assist as needed, involve social worker if needed. Lab Results  Component Value Date   HGBA1C 5.7 10/06/2021   Hypertension Interventions:  (Status:  New goal. and Goal on track:  Yes.) Long Term Goal Last practice recorded BP readings:  BP Readings from Last 3 Encounters:  02/04/22 136/70  02/04/22 (!) 156/78  10/09/21 (!) 144/65  Most recent eGFR/CrCl:  Lab Results  Component  Value Date   EGFR 15 (L) 03/27/2021    No components found for: CRCL  Evaluation of current treatment plan related to hypertension self management and patient's adherence to plan as established by provider Reviewed medications with patient and discussed importance of compliance Advised patient, providing education and rationale, to monitor blood pressure daily and record, calling PCP for findings outside established parameters Reviewed safety precautions Reviewed tips for everyday care of patients with dementia/ behavioral issues Reinforced low sodium diet Reviewed plan of care with patient's daughter In basket message sent to primary care provider with update  Patient Goals/Self-Care Activities: Take medications as prescribed   Attend all scheduled provider appointments Call pharmacy for medication refills 3-7 days in advance of running out of medications Call provider office for new concerns or questions  check blood sugar at prescribed times: twice daily check feet daily for cuts, sores or redness enter blood sugar readings and medication or insulin into daily log take the blood sugar log to all doctor visits take the blood sugar meter to all doctor visits trim toenails straight across drink 6 to 8  glasses of water each day fill half of plate with vegetables keep feet up while sitting wash and dry feet carefully every day wear comfortable, cotton socks wear comfortable, well-fitting shoes check blood pressure weekly choose a place to take my blood pressure (home, clinic or office, retail store) write blood pressure results in a log or diary learn about high blood pressure keep all doctor appointments take medications for blood pressure exactly as prescribed eat more whole grains, fruits and vegetables, lean meats and healthy fats Follow low sodium diet, limit fast food Change positions every 2 hours to prevent skin breakdown and promote healing Eat a well balanced diet for  wound healing Tips for everyday care of patients with dementia/ behavioral issues: keep a routine, such as bathing dressing eating at the same time each day, plan activities that the person enjoys and try to do them at the same time each day, serve meals in a consistent, familiar place and give patient enough time to eat, encourage use of loose-fitting, comfortable, easy to use clothing such as, elastic waistbands, fabric fasteners, or large zipper pulls instead of shoelaces, buttons, or buckles. Follow up with Palliative Care There will be a different care manager outreaching you in September Please check with family member to see if patient applied for medicaid, if not and patient/ family wishes to apply please let RN care manager know if you need assistance       Plan:Telephone follow up appointment with care management team member scheduled for:  08/27/22 at 69 am  Jacqlyn Larsen [facility], BSN RN Case Manager [facility] [phone]

## 2022-07-24 NOTE — Patient Instructions (Signed)
Visit Information  Thank you for taking time to visit with me today. Please don't hesitate to contact me if I can be of assistance to you before our next scheduled telephone appointment.  Following are the goals we discussed today:  Take medications as prescribed   Attend all scheduled provider appointments Call pharmacy for medication refills 3-7 days in advance of running out of medications Call provider office for new concerns or questions  check blood sugar at prescribed times: twice daily check feet daily for cuts, sores or redness enter blood sugar readings and medication or insulin into daily log take the blood sugar log to all doctor visits take the blood sugar meter to all doctor visits trim toenails straight across drink 6 to 8 glasses of water each day fill half of plate with vegetables keep feet up while sitting wash and dry feet carefully every day wear comfortable, cotton socks wear comfortable, well-fitting shoes check blood pressure weekly choose a place to take my blood pressure (home, clinic or office, retail store) write blood pressure results in a log or diary learn about high blood pressure keep all doctor appointments take medications for blood pressure exactly as prescribed eat more whole grains, fruits and vegetables, lean meats and healthy fats Follow low sodium diet, limit fast food Change positions every 2 hours to prevent skin breakdown and promote healing Eat a well balanced diet for wound healing Tips for everyday care of patients with dementia/ behavioral issues: keep a routine, such as bathing dressing eating at the same time each day, plan activities that the person enjoys and try to do them at the same time each day, serve meals in a consistent, familiar place and give patient enough time to eat, encourage use of loose-fitting, comfortable, easy to use clothing such as, elastic waistbands, fabric fasteners, or large zipper pulls instead of shoelaces,  buttons, or buckles. Follow up with Palliative Care There will be a different care manager outreaching you in September  Please call the care guide team at [phone] if you need to cancel or reschedule your appointment.   If you are experiencing a Mental Health or [place] or need someone to talk to, please call the Suicide and Crisis Lifeline: 988 call the [place] National Suicide Prevention Lifeline: 986-657-7170 or TTY: 410 005 7979 TTY (248)474-2123) to talk to a trained counselor call 1-800-273-TALK (toll free, 24 hour hotline) go to [facility] [address], [place] [phone]) call the [place]: [phone] call 911   The patient verbalized understanding of instructions, educational materials, and care plan provided today and DECLINED offer to receive copy of patient instructions, educational materials, and care plan.   Jacqlyn Larsen [facility], BSN RN Case Manager [facility] [phone]

## 2022-07-24 NOTE — Patient Instructions (Signed)
Visit Information  Thank you for taking time to visit with me today. Please don't hesitate to contact me if I can be of assistance to you before our next scheduled telephone appointment.  Following are the goals we discussed today:  There will be a different care manager outreaching you in September Please check with family member to see if patient applied for medicaid, if not and patient/ family wishes to apply please let RN care manager know if you need assistance  Our next appointment is by telephone on 08/27/22 at 1045 am  Please call the care guide team at [phone] if you need to cancel or reschedule your appointment.   If you are experiencing a Mental Health or [place] or need someone to talk to, please call the Suicide and Crisis Lifeline: 988 call the [place] National Suicide Prevention Lifeline: 8470896699 or TTY: (726)529-0596 TTY (210)692-9380) to talk to a trained counselor call 1-800-273-TALK (toll free, 24 hour hotline) go to [facility] [address] [place] [phone]) call the [place]: 7197239701 call 911   The patient verbalized understanding of instructions, educational materials, and care plan provided today and DECLINED offer to receive copy of patient instructions, educational materials, and care plan.   Jacqlyn Larsen [facility], BSN RN Case Manager [place] [facility] [phone]

## 2022-07-24 NOTE — Chronic Care Management (AMB) (Signed)
Chronic Care Management   CCM RN Visit Note  07/24/2022 Name: Chase [name] MRN: [id number] DOB: [birth year]  Subjective: Chase [name] is a 88 y.o. year old male who is a primary care patient of Moshe Cipro Norwood Levo, MD. The care management team was consulted for assistance with disease management and care coordination needs.    Engaged with patient by telephone for follow up visit in response to provider referral for case management and/or care coordination services.   Consent to Services:  The patient was given information about Chronic Care Management services, agreed to services, and gave verbal consent prior to initiation of services.  Please see initial visit note for detailed documentation.   Patient agreed to services and verbal consent obtained.   Assessment: Review of patient past medical history, allergies, medications, health status, including review of consultants reports, laboratory and other test data, was performed as part of comprehensive evaluation and provision of chronic care management services.   SDOH (Social Determinants of Health) assessments and interventions performed:    CCM Care Plan  Allergies  Allergen Reactions   Bayer Advanced Aspirin [Aspirin] Nausea And Vomiting   Penicillins Nausea And Vomiting    Has patient had a PCN reaction causing immediate rash, facial/tongue/throat swelling, SOB or lightheadedness with hypotension: unknown Has patient had a PCN reaction causing severe rash involving mucus membranes or skin necrosis: unknown Has patient had a PCN reaction that required hospitalization: unknown Has patient had a PCN reaction occurring within the last 10 years: no If all of the above answers are "NO", then may proceed with Cephalosporin use.    Outpatient Encounter Medications as of 07/24/2022  Medication Sig   amLODipine (NORVASC) 10 MG tablet TAKE 1 TABLET DAILY   calcium carbonate (TUMS - DOSED IN MG ELEMENTAL CALCIUM) 500 MG chewable  tablet Chew 0.5 tablets by mouth 2 (two) times daily.   clopidogrel (PLAVIX) 75 MG tablet TAKE 1 TABLET DAILY   diphenoxylate-atropine (LOMOTIL) 2.5-0.025 MG tablet Take one tablet by mouth once daily as needed, for diarrheah   donepezil (ARICEPT) 5 MG tablet Take 5 mg by mouth daily.   famotidine (PEPCID) 20 MG tablet Take 20 mg by mouth 2 (two) times daily.   hydrALAZINE (APRESOLINE) 25 MG tablet Take 25 mg by mouth in the morning and at bedtime.   hydrOXYzine (ATARAX) 10 MG tablet Take one tablet by mouth once daily, as needed, for agitiation   Insulin Pen Needle (B-D ULTRAFINE III SHORT PEN) 31G X 8 MM MISC 1 each by Does not apply route as directed.   levETIRAcetam (KEPPRA) 100 MG/ML solution Take 500 mg by mouth daily.   levothyroxine (SYNTHROID) 112 MCG tablet TAKE 1 TABLET DAILY BEFORE BREAKFAST   Multiple Vitamins-Minerals (MULTIVITAMIN WITH MINERALS) tablet Take 1 tablet by mouth daily.   olopatadine (PATANOL) 0.1 % ophthalmic solution INSTILL 1 DROP INTO EACH EYE TWICE DAILY   ondansetron (ZOFRAN-ODT) 8 MG disintegrating tablet Take 8 mg by mouth every 8 (eight) hours as needed.   OneTouch Delica Lancets 75I MISC 1 each by Other route 2 (two) times daily.   ONETOUCH ULTRA test strip USE 1 STRIP THREE TIMES A DAY AS INSTRUCTED   polyethylene glycol powder (GLYCOLAX/MIRALAX) 17 GM/SCOOP powder Take 17 g by mouth daily. (Patient taking differently: Take 17 g by mouth daily as needed.)   pravastatin (PRAVACHOL) 20 MG tablet TAKE 1 TABLET DAILY   sodium bicarbonate 650 MG tablet Take 650 mg by mouth 3 (three) times daily.  tamsulosin (FLOMAX) 0.4 MG CAPS capsule TAKE 1 CAPSULE DAILY   No facility-administered encounter medications on file as of 07/24/2022.    Patient Active Problem List   Diagnosis Date Noted   Dementia ([place]) 07/19/2022   Sacral ulcer ([place]) 05/06/2022   Delusional disorder ([place]) 03/16/2022   Aggressive behavior 03/16/2022   COVID-19 08/08/2021   Epilepsy ([place])  11/07/2020   Atherosclerosis of native artery of left lower extremity with ulceration of ankle ([place]) 08/23/2020   Loose stools 10/28/2019   Incontinence 07/08/2019   Osteoarthritis of left knee 04/15/2019   Diarrhea 04/15/2019   Common bile duct stone 04/18/2018   Cholecystocutaneous fistula    Physical deconditioning 02/28/2018   Choledocholithiasis 02/25/2018   Hyperkalemia 62/02/5596   Metabolic bone disease 41/63/8453   Fistula of bile duct 02/14/2018   Dermatitis 02/25/2017   Essential hypertension, benign 12/13/2015   RBBB (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 fascicular block)    Hypertensive heart disease 12/01/2014   Chronic venous insufficiency 11/16/2014   Carotid artery disease ([place]) 05/18/2014   Poor balance 09/11/2013   Difficulty walking 09/11/2013   Hx of falling 09/11/2013   History of stroke 09/07/2012   CKD (chronic kidney disease) stage 4, GFR 15-29 ml/min (HCC) 09/07/2012   Anemia of chronic disease 09/07/2012   Cholelithiasis 08/10/2012   Peripheral autonomic neuropathy due to diabetes mellitus ([place]) 03/14/2012   Seizure disorder, complex partial ([place]) 03/07/2012   Type 2 diabetes mellitus with diabetic nephropathy ([place])    Hypothyroidism 03/09/2008   Mixed hyperlipidemia 03/09/2008   Renovascular hypertension 03/09/2008   Peripheral vascular disease ([place]) 03/09/2008   DEGENERATIVE JOINT DISEASE, KNEE 01/26/2008   Osteoarthritis of spine 01/26/2008   Spinal stenosis     Conditions to be addressed/monitored:HTN and DMII  Care Plan : RN Care Manager Plan of Care  Updates made by Kassie Mends, RN since 07/24/2022 12:00 AM     Problem: No plan of care established for management of chronic disease state  (DM2, HTN, CKD stage 4)   Priority: High     Long-Range Goal: Development of plan of care for chronic disease management  (DM2, HTN, CKD stage 4)   Start Date: 03/04/2022  Expected End Date: 08/31/2022  Priority: High  Note:   Current  Barriers:  Knowledge Deficits related to plan of care for management of HTN and DMII  No Advanced Directives in place- family requests information be mailed Spoke with spouse [name] (permission given by pt), reports pt requires assistance with ADL's and IADL's (such as bathing, mobility, transportation, medication oversight, etc.) Pt is on grant program through [employer] and has aide M-F 8 am- 12 noon. CG reports received resources for wheelchair ramp, home modifications and transportation, patient did receive new wheelchair, reports they are still having increasing difficulty with getting pt out of the house and into the car, daughters are assisting. Spouse reports  CBG now monitored with [facility] since end of March, reports  FBS ranges low 100's - 114-132 over the past week, random ranges 200's, pt does not follow a special diet. Blood pressure is not monitored. Spouse reports Dr. Merlene Laughter handling any issues with dementia, reports patient has home health nurse several times weekly to manage 2 pressure ulcers on buttocks. 07/24/22- spoke with patient's wife [name] who reports blood sugar "is very good"  FBS ranges 80-83, random ranges 120-189, states has all medications and taking as prescribed, reports Palliative care nurse saw patient in the home 07/23/22 and  awaiting decision about services, states patient's daughter would know more about this, RN care manager to call daughter [name].  Home health has discharged patient, continues getting assistance through grant program through [employer].  RNCM Clinical Goal(s):  Patient will verbalize understanding of plan for management of HTN and DMII as evidenced by caregiver report, review of EHR and  through collaboration with RN Care manager, provider, and care team.   Interventions: 1:1 collaboration with primary care provider regarding development and update of comprehensive plan of care as evidenced by provider attestation and  co-signature Inter-disciplinary care team collaboration (see longitudinal plan of care) Evaluation of current treatment plan related to  self management and patient's adherence to plan as established by provider   Diabetes Interventions:  (Status:  New goal. and Goal on track:  Yes.) Long Term Goal Assessed patient's understanding of A1c goal: <7% Reviewed medications with patient and discussed importance of medication adherence Review of patient status, including review of consultants reports, relevant laboratory and other test results, and medications completed Reinforced carbohydrate modified diet Reinforced importance of well balanced diet to promote healing Reinforced importance of changing positions q 2 hours to prevent skin breakdown and promote healing Reviewed with patient's wife there will be another care manager outreaching patient in September Lab Results  Component Value Date   HGBA1C 5.7 10/06/2021   Hypertension Interventions:  (Status:  New goal. and Goal on track:  Yes.) Long Term Goal Last practice recorded BP readings:  BP Readings from Last 3 Encounters:  02/04/22 136/70  02/04/22 (!) 156/78  10/09/21 (!) 144/65  Most recent eGFR/CrCl:  Lab Results  Component Value Date   EGFR 15 (L) 03/27/2021    No components found for: CRCL  Evaluation of current treatment plan related to hypertension self management and patient's adherence to plan as established by provider Reviewed medications with patient and discussed importance of compliance Advised patient, providing education and rationale, to monitor blood pressure daily and record, calling PCP for findings outside established parameters Reviewed safety precautions Reviewed tips for everyday care of patients with dementia/ behavioral issues Reinforced low sodium diet  Patient Goals/Self-Care Activities: Take medications as prescribed   Attend all scheduled provider appointments Call pharmacy for medication refills  3-7 days in advance of running out of medications Call provider office for new concerns or questions  check blood sugar at prescribed times: twice daily check feet daily for cuts, sores or redness enter blood sugar readings and medication or insulin into daily log take the blood sugar log to all doctor visits take the blood sugar meter to all doctor visits trim toenails straight across drink 6 to 8 glasses of water each day fill half of plate with vegetables keep feet up while sitting wash and dry feet carefully every day wear comfortable, cotton socks wear comfortable, well-fitting shoes check blood pressure weekly choose a place to take my blood pressure (home, clinic or office, retail store) write blood pressure results in a log or diary learn about high blood pressure keep all doctor appointments take medications for blood pressure exactly as prescribed eat more whole grains, fruits and vegetables, lean meats and healthy fats Follow low sodium diet, limit fast food Change positions every 2 hours to prevent skin breakdown and promote healing Eat a well balanced diet for wound healing Tips for everyday care of patients with dementia/ behavioral issues: keep a routine, such as bathing dressing eating at the same time each day, plan activities that the person enjoys and  try to do them at the same time each day, serve meals in a consistent, familiar place and give patient enough time to eat, encourage use of loose-fitting, comfortable, easy to use clothing such as, elastic waistbands, fabric fasteners, or large zipper pulls instead of shoelaces, buttons, or buckles. Follow up with Palliative Care There will be a different care manager outreaching you in September       Plan:Telephone follow up appointment with care management team member scheduled for:  outreach patient's daughter [name] today 07/24/22  Jacqlyn Larsen [facility], BSN RN Case Manager [facility] [phone]

## 2022-07-27 ENCOUNTER — Telehealth: Payer: Self-pay

## 2022-07-27 NOTE — Telephone Encounter (Signed)
Attempted to contact patient/patient's spouse [name] to schedule a Palliative Care consult appointment. No answer left a message to return call.

## 2022-07-28 ENCOUNTER — Telehealth: Payer: Self-pay | Admitting: Family Medicine

## 2022-07-28 NOTE — Telephone Encounter (Signed)
Pls order light weight wheelchair for pt, msg from [place], and palliative states he does nOT qualify for hospice currrently, fyi

## 2022-07-29 NOTE — Telephone Encounter (Signed)
Ordered a light weight wheelchair through [employer] for [employer]

## 2022-07-30 DIAGNOSIS — B351 Tinea unguium: Secondary | ICD-10-CM | POA: Diagnosis not present

## 2022-07-30 DIAGNOSIS — E1142 Type 2 diabetes mellitus with diabetic polyneuropathy: Secondary | ICD-10-CM | POA: Diagnosis not present

## 2022-08-04 ENCOUNTER — Telehealth: Payer: Self-pay

## 2022-08-04 NOTE — Telephone Encounter (Signed)
Spoke with patient's daughter Adela Lank regarding Palliative Care services. She declined services. She states patient is with [facility]. Will cancel referral and notify referring provider.

## 2022-08-05 ENCOUNTER — Ambulatory Visit: Payer: Medicare Other | Admitting: Family Medicine

## 2022-08-06 ENCOUNTER — Telehealth: Payer: Medicare Other

## 2022-08-06 ENCOUNTER — Ambulatory Visit (INDEPENDENT_AMBULATORY_CARE_PROVIDER_SITE_OTHER): Payer: Medicare Other | Admitting: Family Medicine

## 2022-08-06 VITALS — BP 130/70 | HR 72

## 2022-08-06 DIAGNOSIS — I10 Essential (primary) hypertension: Secondary | ICD-10-CM

## 2022-08-06 DIAGNOSIS — R630 Anorexia: Secondary | ICD-10-CM | POA: Diagnosis not present

## 2022-08-06 DIAGNOSIS — F03B3 Unspecified dementia, moderate, with mood disturbance: Secondary | ICD-10-CM | POA: Diagnosis not present

## 2022-08-06 DIAGNOSIS — Z794 Long term (current) use of insulin: Secondary | ICD-10-CM

## 2022-08-06 DIAGNOSIS — I639 Cerebral infarction, unspecified: Secondary | ICD-10-CM | POA: Diagnosis not present

## 2022-08-06 DIAGNOSIS — E038 Other specified hypothyroidism: Secondary | ICD-10-CM

## 2022-08-06 DIAGNOSIS — I150 Renovascular hypertension: Secondary | ICD-10-CM | POA: Diagnosis not present

## 2022-08-06 DIAGNOSIS — E1159 Type 2 diabetes mellitus with other circulatory complications: Secondary | ICD-10-CM

## 2022-08-06 DIAGNOSIS — Z9181 History of falling: Secondary | ICD-10-CM

## 2022-08-06 DIAGNOSIS — R32 Unspecified urinary incontinence: Secondary | ICD-10-CM | POA: Diagnosis not present

## 2022-08-06 NOTE — Patient Instructions (Signed)
F/U in mid December, call if you need me sooner  Please arrange for flu vaccine  No changes in medication  I recommend looking at [facility] for the equipment you ask about, let us know specifics if you find somethingh you want to pursue  We CANNOT determine whether or not the insurance company will pay for the equipment even if he needs it  Work on eating more often and bigger quantities  Careful not to fall  Thanks for choosing [name], we consider it a privelige to serve you.

## 2022-08-06 NOTE — Progress Notes (Signed)
   Chase [name]     MRN: [id number]      DOB: [birth year]   HPI Chase [name] is here for follow up and re-evaluation of chronic medical conditions, medication management and review of any available recent lab and radiology data.  Family continues to work together to keep Chase [name] at home with his wife, which is what they want fo him and what he wants for himself. All children are involved with this They are challenged with getting the help that he needs  at an affordable price as he does not qualify for medicaid or hospice. Current request is for a lift chair seen on line reporedly, that will actually help to put him into a vehicle. I have advised them to provide the specifics and we will take it from there. I also made it clear that the likelihood of this being covered by oins is very low ,nad I myself am  unaware of equipment like this  Ongoing challenge of poor appetite , ongoing weight loss and he always c/o feeling cold which is chronic No falls, and very limited mobility in the home Needs assistance with any movement  ROS Pt reports his c/o being cold Denies recent fever or chills. Denies sinus pressure, nasal congestion, ear pain or sore throat. Denies chest congestion, productive cough or wheezing. Denies chest pains, palpitations and leg swelling Denies abdominal pain, nausea, vomiting,diarrhea or constipation.   Denies dysuria, frequency, hesitancy or incontinence. Chronic c/o limitation in mobility. Chronic lower extremity numbness, and  tingling. Denies depression, anxiety or insomnia. Denies skin break down or rash.   PE BP 130/70   Pulse 72    Patient alert and in no cardiopulmonary distress.  HEENT: No facial asymmetry, EOMI,     Neck decreased ROM.  Chest: Clear to auscultation bilaterally.  CVS: S1, S2 no murmurs, no S3.Regular rate.  ABD: Soft non tender.   Ext: No edema  MS: markedly decreased ROM spine, shoulders, hips and knees.  Skin: Intact, no  ulcerations or rash noted.  Psych: poor  eye contact, . Memory loss not anxious or depressed appearing.  CNS: CN 2-12 intact, power,  normal throughout.no focal deficits noted.   Assessment & Plan  Hypothyroidism Managed by Endo and controlled  Renovascular hypertension Controlled, no change in medication   Dementia (Chase [name]) Continue current med dose, has ckd  Hx of falling Home safety reviewed and fall risk reduction. Would benefit from any equipment to help with movement bothiside and outside of the home as he  Is incapable of independent movement  Incontinence Requires incontinence supplies, underwear and chux  Poor appetite Increased weight loss and poor appetite, encouraged pt to intentionally ear more and family to provide any supplements which he will take

## 2022-08-07 ENCOUNTER — Other Ambulatory Visit: Payer: Self-pay | Admitting: Urology

## 2022-08-07 DIAGNOSIS — N184 Chronic kidney disease, stage 4 (severe): Secondary | ICD-10-CM | POA: Diagnosis not present

## 2022-08-09 ENCOUNTER — Encounter: Payer: Self-pay | Admitting: Family Medicine

## 2022-08-09 NOTE — Assessment & Plan Note (Signed)
Controlled, no change in medication  

## 2022-08-09 NOTE — Assessment & Plan Note (Signed)
Managed by Endo and controlled 

## 2022-08-09 NOTE — Assessment & Plan Note (Signed)
Continue current med dose, has ckd

## 2022-08-09 NOTE — Assessment & Plan Note (Signed)
Home safety reviewed and fall risk reduction. Would benefit from any equipment to help with movement bothiside and outside of the home as he  Is incapable of independent movement

## 2022-08-09 NOTE — Assessment & Plan Note (Signed)
Increased weight loss and poor appetite, encouraged pt to intentionally ear more and family to provide any supplements which he will take

## 2022-08-09 NOTE — Assessment & Plan Note (Signed)
Requires incontinence supplies, underwear and chux

## 2022-08-12 ENCOUNTER — Ambulatory Visit: Payer: Medicare Other | Admitting: Nurse Practitioner

## 2022-08-21 ENCOUNTER — Telehealth: Payer: Self-pay | Admitting: Family Medicine

## 2022-08-21 NOTE — Telephone Encounter (Signed)
Patients wife aware to D/C medication

## 2022-08-21 NOTE — Telephone Encounter (Signed)
Called in on patient behalf in regard to   famotidine (PEPCID) 20 MG tablet   Wants to know if patient still needs to be taking med

## 2022-08-27 ENCOUNTER — Ambulatory Visit (INDEPENDENT_AMBULATORY_CARE_PROVIDER_SITE_OTHER): Payer: Medicare Other

## 2022-08-27 DIAGNOSIS — L89322 Pressure ulcer of left buttock, stage 2: Secondary | ICD-10-CM | POA: Diagnosis not present

## 2022-08-27 DIAGNOSIS — E1122 Type 2 diabetes mellitus with diabetic chronic kidney disease: Secondary | ICD-10-CM | POA: Diagnosis not present

## 2022-08-27 DIAGNOSIS — R809 Proteinuria, unspecified: Secondary | ICD-10-CM | POA: Diagnosis not present

## 2022-08-27 DIAGNOSIS — I129 Hypertensive chronic kidney disease with stage 1 through stage 4 chronic kidney disease, or unspecified chronic kidney disease: Secondary | ICD-10-CM | POA: Diagnosis not present

## 2022-08-27 DIAGNOSIS — D631 Anemia in chronic kidney disease: Secondary | ICD-10-CM | POA: Diagnosis not present

## 2022-08-27 DIAGNOSIS — F01B11 Vascular dementia, moderate, with agitation: Secondary | ICD-10-CM | POA: Diagnosis not present

## 2022-08-27 DIAGNOSIS — E1121 Type 2 diabetes mellitus with diabetic nephropathy: Secondary | ICD-10-CM

## 2022-08-27 DIAGNOSIS — E1129 Type 2 diabetes mellitus with other diabetic kidney complication: Secondary | ICD-10-CM | POA: Diagnosis not present

## 2022-08-27 DIAGNOSIS — N189 Chronic kidney disease, unspecified: Secondary | ICD-10-CM | POA: Diagnosis not present

## 2022-08-27 DIAGNOSIS — I119 Hypertensive heart disease without heart failure: Secondary | ICD-10-CM

## 2022-08-27 DIAGNOSIS — I5032 Chronic diastolic (congestive) heart failure: Secondary | ICD-10-CM | POA: Diagnosis not present

## 2022-08-27 DIAGNOSIS — Z515 Encounter for palliative care: Secondary | ICD-10-CM | POA: Diagnosis not present

## 2022-08-27 NOTE — Patient Instructions (Signed)
Visit Information  Thank you for taking time to visit with me today. Please don't hesitate to contact me if I can be of assistance to you before our next scheduled telephone appointment.  Following are the goals we discussed today:  Take medications as prescribed   Attend all scheduled provider appointments Call pharmacy for medication refills 3-7 days in advance of running out of medications Call provider office for new concerns or questions  check blood sugar at prescribed times: twice daily check feet daily for cuts, sores or redness enter blood sugar readings and medication or insulin into daily log take the blood sugar log to all doctor visits take the blood sugar meter to all doctor visits trim toenails straight across fill half of plate with vegetables read food labels for fat, fiber, carbohydrates and portion size keep feet up while sitting wash and dry feet carefully every day wear comfortable, cotton socks wear comfortable, well-fitting shoes check blood pressure weekly choose a place to take my blood pressure (home, clinic or office, retail store) write blood pressure results in a log or diary learn about high blood pressure keep all doctor appointments take medications for blood pressure exactly as prescribed eat more whole grains, fruits and vegetables, lean meats and healthy fats Follow low sodium diet, limit fast food Important- Change positions every 2 hours to prevent skin breakdown and promote healing Eat a well balanced diet for wound healing including adequate protein (meat, chicken, fish, peanut butter, beans, yogurt etc) Tips for everyday care of patients with dementia/ behavioral issues: keep a routine, such as bathing dressing eating at the same time each day, plan activities that the person enjoys and try to do them at the same time each day, serve meals in a consistent, familiar place and give patient enough time to eat, encourage use of loose-fitting,  comfortable, easy to use clothing such as, elastic waistbands, fabric fasteners, or large zipper pulls instead of shoelaces, buttons, or buckles. Continue following up with Palliative Care  Our next appointment is by telephone on 09/27/2022 at 1045 am  Please call the care guide team at [phone] if you need to cancel or reschedule your appointment.   If you are experiencing a Mental Health or [place] or need someone to talk to, please call the Suicide and Crisis Lifeline: 988 call the [place] National Suicide Prevention Lifeline: 458-319-0970 or TTY: 905-181-3280 TTY 725 746 3730) to talk to a trained counselor call 1-800-273-TALK (toll free, 24 hour hotline) go to [facility] [address] [place] [phone]) call the [place]: [phone] call 911   The patient verbalized understanding of instructions, educational materials, and care plan provided today and DECLINED offer to receive copy of patient instructions, educational materials, and care plan.   Jacqlyn Larsen [facility], BSN RN Case Manager [facility] [phone]

## 2022-08-27 NOTE — Chronic Care Management (AMB) (Signed)
Chronic Care Management   CCM RN Visit Note  08/27/2022 Name: Chase [name] MRN: [id number] DOB: [birth year]  Subjective: Chase [name] is a 88 y.o. year old male who is a primary care patient of Moshe Cipro Norwood Levo, MD. The care management team was consulted for assistance with disease management and care coordination needs.    Engaged with patient by telephone for follow up visit in response to provider referral for case management and/or care coordination services.   Consent to Services:  The patient was given information about Chronic Care Management services, agreed to services, and gave verbal consent prior to initiation of services.  Please see initial visit note for detailed documentation.   Patient agreed to services and verbal consent obtained.   Assessment: Review of patient past medical history, allergies, medications, health status, including review of consultants reports, laboratory and other test data, was performed as part of comprehensive evaluation and provision of chronic care management services.   SDOH (Social Determinants of Health) assessments and interventions performed:  SDOH Interventions    Flowsheet Row Chronic Care Management from 03/04/2022 in [facility] Video Visit from 10/01/2020 in [facility] Clinical Support from 09/26/2018 in [facility]  SDOH Interventions     Food Insecurity Interventions Intervention Not Indicated Intervention Not Indicated --  Housing Interventions Intervention Not Indicated Intervention Not Indicated --  Transportation Interventions Other (Comment)  [referral to careguide for transportation resources] Intervention Not Indicated --  Depression Interventions/Treatment  -- -- Patient refuses Treatment  Financial Strain Interventions -- Intervention Not Indicated --  Physical Activity Interventions -- Patient Refused --  Stress Interventions -- Intervention Not Indicated --  Social Connections  Interventions -- Intervention Not Indicated --        CCM Care Plan  Allergies  Allergen Reactions   Bayer Advanced Aspirin [Aspirin] Nausea And Vomiting   Penicillins Nausea And Vomiting    Has patient had a PCN reaction causing immediate rash, facial/tongue/throat swelling, SOB or lightheadedness with hypotension: unknown Has patient had a PCN reaction causing severe rash involving mucus membranes or skin necrosis: unknown Has patient had a PCN reaction that required hospitalization: unknown Has patient had a PCN reaction occurring within the last 10 years: no If all of the above answers are "NO", then may proceed with Cephalosporin use.    Outpatient Encounter Medications as of 08/27/2022  Medication Sig   amLODipine (NORVASC) 10 MG tablet TAKE 1 TABLET DAILY   calcium carbonate (TUMS - DOSED IN MG ELEMENTAL CALCIUM) 500 MG chewable tablet Chew 0.5 tablets by mouth 2 (two) times daily.   clopidogrel (PLAVIX) 75 MG tablet TAKE 1 TABLET DAILY   diphenoxylate-atropine (LOMOTIL) 2.5-0.025 MG tablet Take one tablet by mouth once daily as needed, for diarrheah   donepezil (ARICEPT) 5 MG tablet Take 5 mg by mouth daily.   famotidine (PEPCID) 20 MG tablet Take 20 mg by mouth 2 (two) times daily.   hydrALAZINE (APRESOLINE) 25 MG tablet Take 25 mg by mouth in the morning and at bedtime.   hydrOXYzine (ATARAX) 10 MG tablet Take one tablet by mouth once daily, as needed, for agitiation   Insulin Pen Needle (B-D ULTRAFINE III SHORT PEN) 31G X 8 MM MISC 1 each by Does not apply route as directed.   levETIRAcetam (KEPPRA) 100 MG/ML solution Take 500 mg by mouth daily.   levothyroxine (SYNTHROID) 112 MCG tablet TAKE 1 TABLET DAILY BEFORE BREAKFAST   Multiple Vitamins-Minerals (MULTIVITAMIN WITH MINERALS) tablet Take 1  tablet by mouth daily.   olopatadine (PATANOL) 0.1 % ophthalmic solution INSTILL 1 DROP INTO EACH EYE TWICE DAILY   ondansetron (ZOFRAN-ODT) 8 MG disintegrating tablet Take 8 mg by  mouth every 8 (eight) hours as needed.   OneTouch Delica Lancets 09M MISC 1 each by Other route 2 (two) times daily.   ONETOUCH ULTRA test strip USE 1 STRIP THREE TIMES A DAY AS INSTRUCTED   polyethylene glycol powder (GLYCOLAX/MIRALAX) 17 GM/SCOOP powder Take 17 g by mouth daily. (Patient taking differently: Take 17 g by mouth daily as needed.)   pravastatin (PRAVACHOL) 20 MG tablet TAKE 1 TABLET DAILY   sodium bicarbonate 650 MG tablet Take 650 mg by mouth 3 (three) times daily.   tamsulosin (FLOMAX) 0.4 MG CAPS capsule TAKE 1 CAPSULE DAILY   No facility-administered encounter medications on file as of 08/27/2022.    Patient Active Problem List   Diagnosis Date Noted   Dementia ([place]) 07/19/2022   Sacral ulcer ([place]) 05/06/2022   Delusional disorder ([place]) 03/16/2022   Aggressive behavior 03/16/2022   Poor appetite 03/16/2022   COVID-19 08/08/2021   Epilepsy ([place]) 11/07/2020   Atherosclerosis of native artery of left lower extremity with ulceration of ankle ([place]) 08/23/2020   Loose stools 10/28/2019   Incontinence 07/08/2019   Osteoarthritis of left knee 04/15/2019   Diarrhea 04/15/2019   Common bile duct stone 04/18/2018   Cholecystocutaneous fistula    Physical deconditioning 02/28/2018   Choledocholithiasis 02/25/2018   Hyperkalemia 07/68/0881   Metabolic bone disease 10/06/5944   Fistula of bile duct 02/14/2018   Dermatitis 02/25/2017   Essential hypertension, benign 12/13/2015   RBBB (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 fascicular block)    Hypertensive heart disease 12/01/2014   Chronic venous insufficiency 11/16/2014   Carotid artery disease ([place]) 05/18/2014   Poor balance 09/11/2013   Difficulty walking 09/11/2013   Hx of falling 09/11/2013   History of stroke 09/07/2012   CKD (chronic kidney disease) stage 4, GFR 15-29 ml/min (HCC) 09/07/2012   Anemia of chronic disease 09/07/2012   Cholelithiasis 08/10/2012   Peripheral autonomic neuropathy due to  diabetes mellitus ([place]) 03/14/2012   Seizure disorder, complex partial ([place]) 03/07/2012   Type 2 diabetes mellitus with diabetic nephropathy ([place])    Hypothyroidism 03/09/2008   Mixed hyperlipidemia 03/09/2008   Renovascular hypertension 03/09/2008   Peripheral vascular disease ([place]) 03/09/2008   DEGENERATIVE JOINT DISEASE, KNEE 01/26/2008   Osteoarthritis of spine 01/26/2008   Spinal stenosis     Conditions to be addressed/monitored:HTN and DMII  Care Plan : RN Care Manager Plan of Care  Updates made by Kassie Mends, RN since 08/27/2022 12:00 AM     Problem: No plan of care established for management of chronic disease state  (DM2, HTN, CKD stage 4)   Priority: High     Long-Range Goal: Development of plan of care for chronic disease management  (DM2, HTN, CKD stage 4)   Start Date: 03/04/2022  Expected End Date: 02/23/2023  Priority: High  Note:   Current Barriers:  Knowledge Deficits related to plan of care for management of HTN and DMII  No Advanced Directives in place- family requests information be mailed Spoke with spouse [name] (permission given by pt), reports pt requires assistance with ADL's and IADL's (such as bathing, mobility, transportation, medication oversight, etc.) Pt is on grant program through [employer] and has aide M-F 8 am- 12 noon. CG reports received resources for wheelchair ramp, home modifications and transportation, patient  did receive new wheelchair, reports they are still having increasing difficulty with getting pt out of the house and into the car, daughters are assisting. Spouse states she and daughter saw a "lift device that will put him in the car and we sent money and it wasn't what we thought"  Spouse states "if we find something, we will see if insurance pays for it", as of now pt has not qualified for medicaid Spouse reports CBG is monitored with Freestyle Libre 2 since end of March, pt does not follow a special diet. Blood pressure is not  monitored. Spouse reports Dr. Merlene Laughter handling any issues with dementia, reports home health nurse has discharged patient and was managing 2 pressure ulcers on buttocks which have now healed. Spoke with patient's wife [name] who reports blood sugar "is very good"  FBS ranges 80-90, random ranges 122-190, states has all medications and taking as prescribed, reports Palliative care nurse saw patient in the home 07/23/22 and pt placed on services  RNCM Clinical Goal(s):  Patient will verbalize understanding of plan for management of HTN and DMII as evidenced by caregiver report, review of EHR and  through collaboration with RN Care manager, provider, and care team.   Interventions: 1:1 collaboration with primary care provider regarding development and update of comprehensive plan of care as evidenced by provider attestation and co-signature Inter-disciplinary care team collaboration (see longitudinal plan of care) Evaluation of current treatment plan related to  self management and patient's adherence to plan as established by provider   Diabetes Interventions:  (Status:  New goal. and Goal on track:  Yes.) Long Term Goal Assessed patient's understanding of A1c goal: <7% Reviewed medications with patient and discussed importance of medication adherence Review of patient status, including review of consultants reports, relevant laboratory and other test results, and medications completed Reviewed carbohydrate modified diet and food choices Reviewed importance of well balanced diet to promote healing and including adequate protein Reviewed importance of changing positions q 2 hours to prevent skin breakdown and promote healing Reviewed CBG log with spouse Lab Results  Component Value Date   HGBA1C 5.7 10/06/2021   Hypertension Interventions:  (Status:  New goal. and Goal on track:  Yes.) Long Term Goal Last practice recorded BP readings:  BP Readings from Last 3 Encounters:  02/04/22 136/70   02/04/22 (!) 156/78  10/09/21 (!) 144/65  Most recent eGFR/CrCl:  Lab Results  Component Value Date   EGFR 15 (L) 03/27/2021    No components found for: CRCL  Evaluation of current treatment plan related to hypertension self management and patient's adherence to plan as established by provider Reviewed medications with patient and discussed importance of compliance Advised patient, providing education and rationale, to monitor blood pressure daily and record, calling PCP for findings outside established parameters Discussed complications of poorly controlled blood pressure such as heart disease, stroke, circulatory complications, vision complications, kidney impairment, sexual dysfunction Reinforced safety precautions and being careful with transfers Reinforced tips for everyday care of patients with dementia/ behavioral issues Reviewed low sodium diet  Patient Goals/Self-Care Activities: Take medications as prescribed   Attend all scheduled provider appointments Call pharmacy for medication refills 3-7 days in advance of running out of medications Call provider office for new concerns or questions  check blood sugar at prescribed times: twice daily check feet daily for cuts, sores or redness enter blood sugar readings and medication or insulin into daily log take the blood sugar log to all doctor visits take the  blood sugar meter to all doctor visits trim toenails straight across fill half of plate with vegetables read food labels for fat, fiber, carbohydrates and portion size keep feet up while sitting wash and dry feet carefully every day wear comfortable, cotton socks wear comfortable, well-fitting shoes check blood pressure weekly choose a place to take my blood pressure (home, clinic or office, retail store) write blood pressure results in a log or diary learn about high blood pressure keep all doctor appointments take medications for blood pressure exactly as  prescribed eat more whole grains, fruits and vegetables, lean meats and healthy fats Follow low sodium diet, limit fast food Important- Change positions every 2 hours to prevent skin breakdown and promote healing Eat a well balanced diet for wound healing including adequate protein (meat, chicken, fish, peanut butter, beans, yogurt etc) Tips for everyday care of patients with dementia/ behavioral issues: keep a routine, such as bathing dressing eating at the same time each day, plan activities that the person enjoys and try to do them at the same time each day, serve meals in a consistent, familiar place and give patient enough time to eat, encourage use of loose-fitting, comfortable, easy to use clothing such as, elastic waistbands, fabric fasteners, or large zipper pulls instead of shoelaces, buttons, or buckles. Continue following up with Palliative Care       Plan:Telephone follow up appointment with care management team member scheduled for:  09/20/2022 at [place] am  Jacqlyn Larsen [facility], BSN RN Case Manager [facility] [phone]

## 2022-08-28 ENCOUNTER — Telehealth: Payer: Self-pay | Admitting: Nurse Practitioner

## 2022-08-28 NOTE — Telephone Encounter (Signed)
Patient called and a message was left on their voicemail with Rayetta Pigg NP recommendation.

## 2022-08-31 ENCOUNTER — Ambulatory Visit: Payer: Medicare Other | Admitting: Nurse Practitioner

## 2022-09-04 NOTE — Telephone Encounter (Signed)
Chase [name] is asking if he has to come in Monday since his daughter dropped off readings the other day.

## 2022-09-04 NOTE — Telephone Encounter (Signed)
ok 

## 2022-09-04 NOTE — Telephone Encounter (Signed)
Wouldn't be a bad idea to at least get a new A1c reading on him since we stopped his insulin at last visit.  I know it is difficult for her to get him out of the house.

## 2022-09-05 DIAGNOSIS — E1159 Type 2 diabetes mellitus with other circulatory complications: Secondary | ICD-10-CM | POA: Diagnosis not present

## 2022-09-05 DIAGNOSIS — Z794 Long term (current) use of insulin: Secondary | ICD-10-CM | POA: Diagnosis not present

## 2022-09-05 DIAGNOSIS — I10 Essential (primary) hypertension: Secondary | ICD-10-CM

## 2022-09-07 ENCOUNTER — Ambulatory Visit (INDEPENDENT_AMBULATORY_CARE_PROVIDER_SITE_OTHER): Payer: Medicare Other | Admitting: Nurse Practitioner

## 2022-09-07 ENCOUNTER — Encounter: Payer: Self-pay | Admitting: Nurse Practitioner

## 2022-09-07 VITALS — BP 136/68 | HR 52 | Ht 68.0 in

## 2022-09-07 DIAGNOSIS — E038 Other specified hypothyroidism: Secondary | ICD-10-CM

## 2022-09-07 DIAGNOSIS — Z794 Long term (current) use of insulin: Secondary | ICD-10-CM | POA: Diagnosis not present

## 2022-09-07 DIAGNOSIS — N184 Chronic kidney disease, stage 4 (severe): Secondary | ICD-10-CM | POA: Diagnosis not present

## 2022-09-07 DIAGNOSIS — I10 Essential (primary) hypertension: Secondary | ICD-10-CM | POA: Diagnosis not present

## 2022-09-07 DIAGNOSIS — E782 Mixed hyperlipidemia: Secondary | ICD-10-CM | POA: Diagnosis not present

## 2022-09-07 DIAGNOSIS — E1122 Type 2 diabetes mellitus with diabetic chronic kidney disease: Secondary | ICD-10-CM | POA: Diagnosis not present

## 2022-09-07 LAB — POCT GLYCOSYLATED HEMOGLOBIN (HGB A1C): Hemoglobin A1C: 5.7 % — AB (ref 4.0–5.6)

## 2022-09-07 NOTE — Progress Notes (Signed)
09/07/2022           Endocrinology follow-up note    Subjective:    Patient ID: Chase [name], male    DOB: 19-Mar-1931, PCP [name], MD   Past Medical History:  Diagnosis Date   Bradycardia 03/15/2012   Carotid artery occlusion    Choledocholithiasis 02/25/2018   CKD (chronic kidney disease) stage 4, GFR 15-29 ml/min (HCC)    Complete lesion of cervical spinal cord ([place]) 03/14/3012   Stable since 2006   CVA (cerebrovascular accident) ([place]) 09/07/12   right sided weakness   Depressive disorder, not elsewhere classified    Diabetes mellitus approx 1994   Diabetic neuropathy ([place])    History of kidney stones    Hypertensive heart disease    Hypothyroidism approx 2000   Lacunar stroke, acute ([place]) 03/14/2012   Obesity    Osteoarthrosis, unspecified whether generalized or localized, lower leg    Other and unspecified hyperlipidemia    Peripheral vascular disease, unspecified ([place])    Seizures ([place])    unknown etiology; on meds, last seizure was 2015   Spinal stenosis, unspecified region other than cervical    Spondylosis of unspecified site without mention of myelopathy    Past Surgical History:  Procedure Laterality Date   BILIARY STENT PLACEMENT N/A 05/27/2018   Procedure: BILIARY STENT PLACEMENT;  Surgeon: Rogene Houston, MD;  [facility];  Service: Endoscopy;  Laterality: N/A;   COLONOSCOPY N/A 08/10/2013   Procedure: COLONOSCOPY;  Surgeon: Rogene Houston, MD;  [facility];  Service: Endoscopy;  Laterality: N/A;  240   ERCP N/A 03/02/2018   Procedure: ENDOSCOPIC RETROGRADE CHOLANGIOPANCREATOGRAPHY (ERCP) With sphincterotomy and stent placement;  Surgeon: Rogene Houston, MD;  [facility];  Service: Gastroenterology;  Laterality: N/A;   ERCP N/A 05/27/2018   Procedure: ENDOSCOPIC RETROGRADE CHOLANGIOPANCREATOGRAPHY (ERCP);  Surgeon: Rogene Houston, MD;  [facility];  Service: Endoscopy;  Laterality: N/A;   ERCP N/A  09/16/2018   Procedure: ENDOSCOPIC RETROGRADE CHOLANGIOPANCREATOGRAPHY (ERCP);  Surgeon: Rogene Houston, MD;  [facility];  Service: Endoscopy;  Laterality: N/A;   GASTROINTESTINAL STENT REMOVAL N/A 05/27/2018   Procedure: Biliary STENT REMOVAL;  Surgeon: Rogene Houston, MD;  [facility];  Service: Endoscopy;  Laterality: N/A;   GASTROINTESTINAL STENT REMOVAL N/A 09/16/2018   Procedure: GASTROINTESTINAL STENT REMOVAL;  Surgeon: Rogene Houston, MD;  [facility];  Service: Endoscopy;  Laterality: N/A;   kidney stones left x2  1975   KIDNEY SURGERY     Ruptured left kidney 30 yrs ago  from a kidney stone   LITHOTRIPSY N/A 05/27/2018   Procedure: MECHANICAL LITHOTRIPSY;  Surgeon: Rogene Houston, MD;  [facility];  Service: Endoscopy;  Laterality: N/A;   REMOVAL OF STONES N/A 09/16/2018   Procedure: REMOVAL OF MULTIPLE STONES WITH BASKET AND BALLOON;  Surgeon: Rogene Houston, MD;  [facility];  Service: Endoscopy;  Laterality: N/A;   SPHINCTEROTOMY N/A 05/27/2018   Procedure: SPHINCTEROTOMY extended;  Surgeon: Rogene Houston, MD;  [facility];  Service: Endoscopy;  Laterality: N/A;   SPYGLASS CHOLANGIOSCOPY N/A 05/27/2018   Procedure: SPYGLASS CHOLANGIOSCOPY;  Surgeon: Rogene Houston, MD;  [facility];  Service: Endoscopy;  Laterality: N/A;   Social History   Socioeconomic History   Marital status: Married    Spouse name: [name]   Number of children:  5   Years of education: 8   Highest education level: 8th grade  Occupational History   Occupation: retired     Fish farm manager: RETIRED  Tobacco Use   Smoking status: Never   Smokeless tobacco: Never  Vaping Use   Vaping Use: Never used  Substance and Sexual Activity   Alcohol use: No    Alcohol/week: 0.0 standard drinks of alcohol   Drug use: No   Sexual activity: Not Currently  Other Topics Concern   Not on file  Social History Narrative   Not on  file   Social Determinants of Health   Financial Resource Strain: Medium Risk (01/22/2022)   Overall Financial Resource Strain (CARDIA)    Difficulty of Paying Living Expenses: Somewhat hard  Food Insecurity: No Food Insecurity (03/04/2022)   Hunger Vital Sign    Worried About Running Out of Food in the Last Year: Never true    Ran Out of Food in the Last Year: Never true  Transportation Needs: No Transportation Needs (03/04/2022)   PRAPARE - Hydrologist (Medical): No    Lack of Transportation (Non-Medical): No  Physical Activity: Sufficiently Active (01/22/2022)   Exercise Vital Sign    Days of Exercise per Week: 5 days    Minutes of Exercise per Session: 30 min  Stress: No Stress Concern Present (01/22/2022)   [place]    Feeling of Stress : Not at all  Social Connections: Moderately Integrated (01/22/2022)   Social Connection and Isolation Panel [NHANES]    Frequency of Communication with Friends and Family: Once a week    Frequency of Social Gatherings with Friends and Family: Twice a week    Attends Religious Services: More than 4 times per year    Active Member of [employer] or Organizations: No    Attends Archivist Meetings: Never    Marital Status: Married   Outpatient Encounter Medications as of 09/07/2022  Medication Sig   amLODipine (NORVASC) 10 MG tablet TAKE 1 TABLET DAILY   calcium carbonate (TUMS - DOSED IN MG ELEMENTAL CALCIUM) 500 MG chewable tablet Chew 0.5 tablets by mouth 2 (two) times daily.   clopidogrel (PLAVIX) 75 MG tablet TAKE 1 TABLET DAILY   diphenoxylate-atropine (LOMOTIL) 2.5-0.025 MG tablet Take one tablet by mouth once daily as needed, for diarrheah   donepezil (ARICEPT) 5 MG tablet Take 5 mg by mouth daily.   famotidine (PEPCID) 20 MG tablet Take 20 mg by mouth 2 (two) times daily.   hydrALAZINE (APRESOLINE) 25 MG tablet Take 25 mg by mouth in the  morning and at bedtime.   hydrOXYzine (ATARAX) 10 MG tablet Take one tablet by mouth once daily, as needed, for agitiation   levETIRAcetam (KEPPRA) 100 MG/ML solution Take 500 mg by mouth daily.   levothyroxine (SYNTHROID) 112 MCG tablet TAKE 1 TABLET DAILY BEFORE BREAKFAST   Multiple Vitamins-Minerals (MULTIVITAMIN WITH MINERALS) tablet Take 1 tablet by mouth daily.   olopatadine (PATANOL) 0.1 % ophthalmic solution INSTILL 1 DROP INTO EACH EYE TWICE DAILY   ondansetron (ZOFRAN-ODT) 8 MG disintegrating tablet Take 8 mg by mouth every 8 (eight) hours as needed.   polyethylene glycol powder (GLYCOLAX/MIRALAX) 17 GM/SCOOP powder Take 17 g by mouth daily. (Patient taking differently: Take 17 g by mouth daily as needed.)   pravastatin (PRAVACHOL) 20 MG tablet TAKE 1 TABLET DAILY   sodium bicarbonate 650 MG tablet Take 650 mg by mouth 3 (  three) times daily.   tamsulosin (FLOMAX) 0.4 MG CAPS capsule TAKE 1 CAPSULE DAILY   [DISCONTINUED] Insulin Pen Needle (B-D ULTRAFINE III SHORT PEN) 31G X 8 MM MISC 1 each by Does not apply route as directed. (Patient not taking: Reported on 09/07/2022)   [DISCONTINUED] OneTouch Delica Lancets 09N MISC 1 each by Other route 2 (two) times daily. (Patient not taking: Reported on 09/07/2022)   [DISCONTINUED] ONETOUCH ULTRA test strip USE 1 STRIP THREE TIMES A DAY AS INSTRUCTED (Patient not taking: Reported on 09/07/2022)   No facility-administered encounter medications on file as of 09/07/2022.   ALLERGIES: Allergies  Allergen Reactions   Bayer Advanced Aspirin [Aspirin] Nausea And Vomiting   Penicillins Nausea And Vomiting    Has patient had a PCN reaction causing immediate rash, facial/tongue/throat swelling, SOB or lightheadedness with hypotension: unknown Has patient had a PCN reaction causing severe rash involving mucus membranes or skin necrosis: unknown Has patient had a PCN reaction that required hospitalization: unknown Has patient had a PCN reaction occurring  within the last 10 years: no If all of the above answers are "NO", then may proceed with Cephalosporin use.   VACCINATION STATUS: Immunization History  Administered Date(s) Administered   Fluad Quad(high Dose 65+) 08/07/2019, 10/07/2020   H1N1 11/14/2008   Influenza Split 10/07/2011, 09/08/2012   Influenza Whole 08/22/2007, 08/27/2010   Influenza,inj,Quad PF,6+ Mos 08/22/2013, 09/26/2014, 09/26/2015, 09/01/2016, 09/20/2017, 09/26/2018   Influenza-Unspecified 09/25/2021   Moderna Sars-Covid-2 Vaccination 01/01/2020, 02/01/2020, 01/05/2021, 06/02/2021   Pneumococcal Conjugate-13 06/12/2014   Pneumococcal Polysaccharide-23 05/21/2004   Td 05/21/2004   Tdap 10/07/2011    Diabetes He presents for his follow-up diabetic visit. He has type 2 diabetes mellitus. Onset time: He was diagnosed at approximate age of 52 years. His disease course has been stable. There are no hypoglycemic associated symptoms. Pertinent negatives for hypoglycemia include no confusion, headaches, nervousness/anxiousness, pallor, seizures or tremors. Pertinent negatives for diabetes include no chest pain, no fatigue, no polydipsia, no polyphagia, no polyuria, no weakness and no weight loss. There are no hypoglycemic complications. Symptoms are stable. Diabetic complications include a CVA, nephropathy, peripheral neuropathy and PVD. Risk factors for coronary artery disease include diabetes mellitus, dyslipidemia, male sex, obesity, sedentary lifestyle and hypertension. When asked about current treatments, none were reported. He is compliant with treatment most of the time. He is following a diabetic and generally healthy diet. When asked about meal planning, he reported none. He has not had a previous visit with a dietitian. He never participates in exercise. His breakfast blood glucose range is generally 90-110 mg/dl. His bedtime blood glucose range is generally 130-140 mg/dl. (He presents today, accompanied by his wife and his  son, with his logs showing at goal glycemic profile.  His POCT A1c today is 5.7%, essentially unchanged from previous visit of 5.8%.  He has maintained his control since discontinuing insulin last visit.) An ACE inhibitor/angiotensin II receptor blocker is not being taken. He does not see a podiatrist.Eye exam is current.  Hyperlipidemia This is a chronic problem. The current episode started more than 1 year ago. The problem is controlled. Recent lipid tests were reviewed and are normal. Exacerbating diseases include chronic renal disease, diabetes and hypothyroidism. There are no known factors aggravating his hyperlipidemia. Pertinent negatives include no chest pain, myalgias or shortness of breath. Current antihyperlipidemic treatment includes statins. The current treatment provides moderate improvement of lipids. There are no compliance problems.  Risk factors for coronary artery disease include dyslipidemia, diabetes mellitus, hypertension,  male sex and a sedentary lifestyle.  Hypertension This is a chronic problem. The current episode started more than 1 year ago. The problem has been resolved since onset. The problem is controlled. Pertinent negatives include no chest pain, headaches, neck pain, palpitations or shortness of breath. Agents associated with hypertension include thyroid hormones. Risk factors for coronary artery disease include diabetes mellitus, dyslipidemia, sedentary lifestyle and male gender. Past treatments include calcium channel blockers and direct vasodilators. The current treatment provides mild improvement. There are no compliance problems.  Hypertensive end-organ damage includes kidney disease, CAD/MI, CVA and PVD. Identifiable causes of hypertension include chronic renal disease and a thyroid problem.  Thyroid Problem Presents for follow-up visit. Symptoms include cold intolerance. Patient reports no anxiety, constipation, depressed mood, diarrhea, fatigue, heat intolerance, leg  swelling, palpitations, tremors, weight gain or weight loss. The symptoms have been stable. Past treatments include levothyroxine. His past medical history is significant for diabetes and hyperlipidemia.    Review of systems  Constitutional: + Minimally fluctuating body weight,  current Body mass index is 25.24 kg/m., no fatigue, no subjective hyperthermia, + subjective hypothermia, + decreased appetite Eyes: no blurry vision, no xerophthalmia ENT: no sore throat, no nodules palpated in throat, no dysphagia/odynophagia, no hoarseness Cardiovascular: no chest pain, no shortness of breath, no palpitations, no leg swelling Respiratory: no cough, no shortness of breath Gastrointestinal: no nausea/vomiting/diarrhea Musculoskeletal: no muscle/joint aches, essentially w/c bound due to deconditioning Skin: no rashes, no hyperemia Neurological: no tremors, no numbness, no tingling, no dizziness Psychiatric: no depression, no anxiety  Objective:    BP 136/68 (BP Location: Left Arm, Patient Position: Sitting, Cuff Size: Normal)   Pulse (!) 52   Ht '5\' 8"'$  (1.727 m) Comment: reported by family  BMI 25.24 kg/m   Wt Readings from Last 3 Encounters:  05/06/22 166 lb (75.3 kg)  10/02/21 166 lb 6.4 oz (75.5 kg)  09/25/21 166 lb 6.4 oz (75.5 kg)    BP Readings from Last 3 Encounters:  09/07/22 136/68  08/06/22 130/70  05/06/22 135/70     Physical Exam- Limited  Constitutional:  Body mass index is 25.24 kg/m. , not in acute distress, normal state of mind Eyes:  EOMI, no exophthalmos Neck: Supple Cardiovascular: RRR, no murmurs, rubs, or gallops, no edema Respiratory: Adequate breathing efforts, no crackles, rales, rhonchi, or wheezing Musculoskeletal: no gross deformities, essentially WC bound due to deconditioning Skin:  no rashes, no hyperemia Neurological: no tremor with outstretched hands     CMP     Component Value Date/Time   NA 139 05/13/2021 1330   NA 143 03/27/2021 0958    K 4.5 05/13/2021 1330   CL 108 05/13/2021 1330   CO2 24 05/13/2021 1330   GLUCOSE 123 (H) 05/13/2021 1330   BUN 37 (H) 05/13/2021 1330   BUN 46 (H) 03/27/2021 0958   CREATININE 3.25 (H) 05/13/2021 1330   CREATININE 3.15 (H) 10/01/2020 1136   CALCIUM 9.3 05/13/2021 1330   PROT 7.0 05/13/2021 1330   PROT 6.8 03/27/2021 0958   ALBUMIN 3.4 (L) 05/13/2021 1330   ALBUMIN 4.0 03/27/2021 0958   AST 21 05/13/2021 1330   ALT 13 05/13/2021 1330   ALKPHOS 64 05/13/2021 1330   BILITOT 0.6 05/13/2021 1330   BILITOT 0.3 03/27/2021 0958   GFRNONAA 18 (L) 05/13/2021 1330   GFRNONAA 16 (L) 07/05/2020 0950   GFRAA 19 (L) 07/05/2020 0950   Diabetic Labs (most recent): Lab Results  Component Value Date   HGBA1C 5.7 (  A) 09/07/2022   HGBA1C 5.8 04/08/2022   HGBA1C 5.7 10/06/2021   MICROALBUR 491.2 (H) 12/21/2018   MICROALBUR 194.2 (H) 02/26/2017   MICROALBUR 76.3 11/12/2015    Lipid Panel     Component Value Date/Time   CHOL 169 03/27/2021 0958   TRIG 32 03/27/2021 0958   HDL 81 03/27/2021 0958   CHOLHDL 2.1 03/27/2021 0958   CHOLHDL 2.1 10/01/2020 1136   VLDL 8 03/12/2016 1139   LDLCALC 80 03/27/2021 0958   LDLCALC 75 10/01/2020 1136     Assessment & Plan:   1)  Controlled Type 2 diabetes mellitus with stage 4 chronic kidney disease, with long-term current use of insulin ([place])  -his diabetes is  complicated by chronic kidney disease stage IV and patient remains at a high risk for more acute and chronic complications of diabetes which include CAD, CVA, CKD, retinopathy, and neuropathy. These are all discussed in detail with the patient.  -His wife is assisting on 100% of his care.  He presents today, accompanied by his wife and his son, with his logs showing at goal glycemic profile.  His POCT A1c today is 5.7%, essentially unchanged from previous visit of 5.8%.  He has maintained his control since discontinuing insulin last visit.  - Nutritional counseling repeated at each  appointment due to patients tendency to fall back in to old habits.  - The patient admits there is a room for improvement in their diet and drink choices. -  Suggestion is made for the patient to avoid simple carbohydrates from their diet including Cakes, Sweet Desserts / Pastries, Ice Cream, Soda (diet and regular), Sweet Tea, Candies, Chips, Cookies, Sweet Pastries, Store Bought Juices, Alcohol in Excess of 1-2 drinks a day, Artificial Sweeteners, Coffee Creamer, and "Sugar-free" Products. This will help patient to have stable blood glucose profile and potentially avoid unintended weight gain.   - I encouraged the patient to switch to unprocessed or minimally processed complex starch and increased protein intake (animal or plant source), fruits, and vegetables.   - Patient is advised to stick to a routine mealtimes to eat 3 meals a day and avoid unnecessary snacks (to snack only to correct hypoglycemia).  - I have approached patient with the following individualized plan to manage diabetes and patient agrees.  -He has done well with maintaining glucose without medications.  He will not need medication unless A1c becomes elevated over 7.5%.  It has become increasingly difficult for him to leave the house, therefore surveillance can be done annually with his PCP moving forward.  -He does not need to routinely monitor glucose at this time.  -Patient is not a candidate for metformin,SGLT2 inhibitors due to CKD.  2) BP/HTN:  His blood pressure is controlled to target for his age.  He is advised to continue Amlodipine 10 mg po daily and Hydralazine 25 mg po twice daily.  3) Lipids/HPL:  His recent lipid panel from 03/27/21 shows controlled LDL of 80  He is advised to continue Pravastatin 20 mg po daily before bed.  Side effects and precautions discussed with him.    4) Hypothyroidism:   -There are no recent TFTs to review.  He is advised to continue Levothyroxine 112 mcg po daily before breakfast.     - We discussed about the correct intake of his thyroid hormone, on empty stomach at fasting, with water, separated by at least 30 minutes from breakfast and other medications,  and separated by more than 4 hours from calcium,  iron, multivitamins, acid reflux medications (PPIs). -Patient is made aware of the fact that thyroid hormone replacement is needed for life, dose to be adjusted by periodic monitoring of thyroid function tests.   - I advised patient to maintain close follow up with [name], MD for primary care needs.      I spent 38 minutes in the care of the patient today including review of labs from [place], Lipids, Thyroid Function, Hematology (current and previous including abstractions from other facilities); face-to-face time discussing  his blood glucose readings/logs, discussing hypoglycemia and hyperglycemia episodes and symptoms, medications doses, his options of short and long term treatment based on the latest standards of care / guidelines;  discussion about incorporating lifestyle medicine;  and documenting the encounter. Risk reduction counseling performed per USPSTF guidelines to reduce obesity and cardiovascular risk factors.     Please refer to Patient Instructions for Blood Glucose Monitoring and Insulin/Medications Dosing Guide"  in media tab for additional information. Please  also refer to " Patient Self Inventory" in the Media  tab for reviewed elements of pertinent patient history.  [name] participated in the discussions, expressed understanding, and voiced agreement with the above plans.  All questions were answered to his satisfaction. he is encouraged to contact clinic should he have any questions or concerns prior to his return visit.    Follow up plan: -Return if symptoms worsen or fail to improve, for may have thyroid and A1c checked by PCP given stability.   Rayetta Pigg, [facility] [address] [place], [place] [address] Phone: [phone] Fax: [phone]  09/07/2022, 2:01 PM

## 2022-09-11 ENCOUNTER — Telehealth: Payer: Self-pay

## 2022-09-11 ENCOUNTER — Telehealth: Payer: Self-pay | Admitting: Family Medicine

## 2022-09-11 DIAGNOSIS — N184 Chronic kidney disease, stage 4 (severe): Secondary | ICD-10-CM | POA: Diagnosis not present

## 2022-09-11 DIAGNOSIS — D631 Anemia in chronic kidney disease: Secondary | ICD-10-CM | POA: Diagnosis not present

## 2022-09-11 NOTE — Telephone Encounter (Signed)
Patient's wife called she is concerned patient had labs at [facility] for Dr Toya Smothers office and on the way home he threw up she states this has never happened before no fever no other symptoms. Please advise ?

## 2022-09-11 NOTE — Telephone Encounter (Signed)
Patients wife aware

## 2022-09-11 NOTE — Telephone Encounter (Signed)
FYI   Pt daughter called stating he is throwing up again and they have called 911 and ems is on the way. She wanted to let you know.

## 2022-09-14 ENCOUNTER — Other Ambulatory Visit: Payer: Self-pay | Admitting: Family Medicine

## 2022-09-14 ENCOUNTER — Telehealth: Payer: Self-pay

## 2022-09-14 MED ORDER — GERHARDT'S BUTT CREAM: TOPICAL_CREAM | CUTANEOUS | 1 refills | Status: DC

## 2022-09-14 MED ORDER — DIPHENOXYLATE-ATROPINE 2.5-0.025 MG PO TABS: ORAL_TABLET | ORAL | 2 refills | Status: DC

## 2022-09-14 NOTE — Telephone Encounter (Signed)
Spoke with pt's wife and informed her that an ov was needed for med refill.  Wife voiced understanding.  Ov appointment made per pt.'s availability.

## 2022-09-14 NOTE — Telephone Encounter (Signed)
Patient called and advised they needed a refill on medication below.   Medication: tamsulosin (FLOMAX) 0.4 MG CAPS capsule   Pharmacy: [facility] in [place]    Thank you

## 2022-09-14 NOTE — Telephone Encounter (Signed)
Patient has a spot bottom it would open up and heal up and patient spouse asking what will help it? Can nurse give her a call back [phone].

## 2022-09-14 NOTE — Telephone Encounter (Signed)
Patient's wife aware sent to [facility]

## 2022-09-14 NOTE — Telephone Encounter (Signed)
Need refills  diphenoxylate-atropine (LOMOTIL) 2.5-0.025 MG tablet      Pharmacy: [facility]

## 2022-09-21 ENCOUNTER — Telehealth: Payer: Self-pay

## 2022-09-21 ENCOUNTER — Other Ambulatory Visit: Payer: Self-pay

## 2022-09-21 MED ORDER — TAMSULOSIN HCL 0.4 MG PO CAPS: 0.4000 mg | ORAL_CAPSULE | Freq: Every day | ORAL | 3 refills | Status: DC

## 2022-09-21 NOTE — Telephone Encounter (Signed)
Refills sent

## 2022-09-21 NOTE — Telephone Encounter (Signed)
Patient spouse  called patient need med refill completely out.   tamsulosin (FLOMAX) 0.4 MG CAPS capsule   Pharmacy: [facility]

## 2022-09-22 ENCOUNTER — Encounter: Payer: Self-pay | Admitting: Urology

## 2022-09-22 ENCOUNTER — Ambulatory Visit (INDEPENDENT_AMBULATORY_CARE_PROVIDER_SITE_OTHER): Payer: Medicare Other | Admitting: Urology

## 2022-09-22 VITALS — BP 152/77 | HR 57

## 2022-09-22 DIAGNOSIS — I639 Cerebral infarction, unspecified: Secondary | ICD-10-CM | POA: Diagnosis not present

## 2022-09-22 DIAGNOSIS — R351 Nocturia: Secondary | ICD-10-CM

## 2022-09-22 DIAGNOSIS — R350 Frequency of micturition: Secondary | ICD-10-CM

## 2022-09-22 DIAGNOSIS — N401 Enlarged prostate with lower urinary tract symptoms: Secondary | ICD-10-CM | POA: Diagnosis not present

## 2022-09-22 DIAGNOSIS — N138 Other obstructive and reflux uropathy: Secondary | ICD-10-CM | POA: Diagnosis not present

## 2022-09-22 DIAGNOSIS — R32 Unspecified urinary incontinence: Secondary | ICD-10-CM

## 2022-09-22 NOTE — Progress Notes (Signed)
H&P  Chief Complaint: BPH  History of Present Illness: 88 year old male comes in today with his wife.  First time at [facility] urology.  Had been seen previously in [place], over 3 years ago.  He has BPH.  He has been on tamsulosin long-term.  The patient now has dementia and is in palliative care.  His wife states that he voids into a diaper.  There is no spontaneous toileting by the patient.  There have been no documented urinary infections over the past 2 to 3 years.  Past Medical History:  Diagnosis Date   Bradycardia 03/15/2012   Carotid artery occlusion    Choledocholithiasis 02/25/2018   CKD (chronic kidney disease) stage 4, GFR 15-29 ml/min (HCC)    Complete lesion of cervical spinal cord ([place]) 03/14/3012   Stable since 2006   CVA (cerebrovascular accident) ([place]) 09/07/12   right sided weakness   Depressive disorder, not elsewhere classified    Diabetes mellitus approx 1994   Diabetic neuropathy ([place])    History of kidney stones    Hypertensive heart disease    Hypothyroidism approx 2000   Lacunar stroke, acute ([place]) 03/14/2012   Obesity    Osteoarthrosis, unspecified whether generalized or localized, lower leg    Other and unspecified hyperlipidemia    Peripheral vascular disease, unspecified ([place])    Seizures ([place])    unknown etiology; on meds, last seizure was 2015   Spinal stenosis, unspecified region other than cervical    Spondylosis of unspecified site without mention of myelopathy     Past Surgical History:  Procedure Laterality Date   BILIARY STENT PLACEMENT N/A 05/27/2018   Procedure: BILIARY STENT PLACEMENT;  Surgeon: Rogene Houston, MD;  [facility];  Service: Endoscopy;  Laterality: N/A;   COLONOSCOPY N/A 08/10/2013   Procedure: COLONOSCOPY;  Surgeon: Rogene Houston, MD;  [facility];  Service: Endoscopy;  Laterality: N/A;  240   ERCP N/A 03/02/2018   Procedure: ENDOSCOPIC RETROGRADE CHOLANGIOPANCREATOGRAPHY (ERCP) With  sphincterotomy and stent placement;  Surgeon: Rogene Houston, MD;  [facility];  Service: Gastroenterology;  Laterality: N/A;   ERCP N/A 05/27/2018   Procedure: ENDOSCOPIC RETROGRADE CHOLANGIOPANCREATOGRAPHY (ERCP);  Surgeon: Rogene Houston, MD;  [facility];  Service: Endoscopy;  Laterality: N/A;   ERCP N/A 09/16/2018   Procedure: ENDOSCOPIC RETROGRADE CHOLANGIOPANCREATOGRAPHY (ERCP);  Surgeon: Rogene Houston, MD;  [facility];  Service: Endoscopy;  Laterality: N/A;   GASTROINTESTINAL STENT REMOVAL N/A 05/27/2018   Procedure: Biliary STENT REMOVAL;  Surgeon: Rogene Houston, MD;  [facility];  Service: Endoscopy;  Laterality: N/A;   GASTROINTESTINAL STENT REMOVAL N/A 09/16/2018   Procedure: GASTROINTESTINAL STENT REMOVAL;  Surgeon: Rogene Houston, MD;  [facility];  Service: Endoscopy;  Laterality: N/A;   kidney stones left x2  1975   KIDNEY SURGERY     Ruptured left kidney 30 yrs ago  from a kidney stone   LITHOTRIPSY N/A 05/27/2018   Procedure: MECHANICAL LITHOTRIPSY;  Surgeon: Rogene Houston, MD;  [facility];  Service: Endoscopy;  Laterality: N/A;   REMOVAL OF STONES N/A 09/16/2018   Procedure: REMOVAL OF MULTIPLE STONES WITH BASKET AND BALLOON;  Surgeon: Rogene Houston, MD;  [facility];  Service: Endoscopy;  Laterality: N/A;   SPHINCTEROTOMY N/A 05/27/2018   Procedure: SPHINCTEROTOMY extended;  Surgeon: Rogene Houston, MD;  [facility];  Service: Endoscopy;  Laterality: N/A;  SPYGLASS CHOLANGIOSCOPY N/A 05/27/2018   Procedure: SPYGLASS CHOLANGIOSCOPY;  Surgeon: Rogene Houston, MD;  [facility];  Service: Endoscopy;  Laterality: N/A;    Home Medications:  Allergies as of 09/22/2022       Reactions   Bayer Advanced Aspirin [aspirin] Nausea And Vomiting   Penicillins Nausea And Vomiting   Has patient had a PCN reaction causing immediate rash, facial/tongue/throat  swelling, SOB or lightheadedness with hypotension: unknown Has patient had a PCN reaction causing severe rash involving mucus membranes or skin necrosis: unknown Has patient had a PCN reaction that required hospitalization: unknown Has patient had a PCN reaction occurring within the last 10 years: no If all of the above answers are "NO", then may proceed with Cephalosporin use.        Medication List        Accurate as of September 22, 2022  3:15 PM. If you have any questions, ask your nurse or doctor.          amLODipine 10 MG tablet Commonly known as: NORVASC TAKE 1 TABLET DAILY   calcium carbonate 500 MG chewable tablet Commonly known as: TUMS - dosed in mg elemental calcium Chew 0.5 tablets by mouth 2 (two) times daily.   clopidogrel 75 MG tablet Commonly known as: PLAVIX TAKE 1 TABLET DAILY   diphenoxylate-atropine 2.5-0.025 MG tablet Commonly known as: Lomotil Take one tablet once daily as needed, for diarrheah   donepezil 5 MG tablet Commonly known as: ARICEPT Take 5 mg by mouth daily.   famotidine 20 MG tablet Commonly known as: PEPCID Take 20 mg by mouth 2 (two) times daily.   Gerhardt's butt cream Crea Apply sparingly twice daily to affected area   hydrALAZINE 25 MG tablet Commonly known as: APRESOLINE Take 25 mg by mouth in the morning and at bedtime.   hydrOXYzine 10 MG tablet Commonly known as: ATARAX Take one tablet by mouth once daily, as needed, for agitiation   levETIRAcetam 100 MG/ML solution Commonly known as: KEPPRA Take 500 mg by mouth daily.   levothyroxine 112 MCG tablet Commonly known as: SYNTHROID TAKE 1 TABLET DAILY BEFORE BREAKFAST   multivitamin with minerals tablet Take 1 tablet by mouth daily.   olopatadine 0.1 % ophthalmic solution Commonly known as: PATANOL INSTILL 1 DROP INTO EACH EYE TWICE DAILY   ondansetron 8 MG disintegrating tablet Commonly known as: ZOFRAN-ODT Take 8 mg by mouth every 8 (eight) hours as  needed.   polyethylene glycol powder 17 GM/SCOOP powder Commonly known as: GLYCOLAX/MIRALAX Take 17 g by mouth daily. What changed:  when to take this reasons to take this   pravastatin 20 MG tablet Commonly known as: PRAVACHOL TAKE 1 TABLET DAILY   sodium bicarbonate 650 MG tablet Take 650 mg by mouth 3 (three) times daily.   tamsulosin 0.4 MG Caps capsule Commonly known as: FLOMAX Take 1 capsule (0.4 mg total) by mouth daily.        Allergies:  Allergies  Allergen Reactions   Bayer Advanced Aspirin [Aspirin] Nausea And Vomiting   Penicillins Nausea And Vomiting    Has patient had a PCN reaction causing immediate rash, facial/tongue/throat swelling, SOB or lightheadedness with hypotension: unknown Has patient had a PCN reaction causing severe rash involving mucus membranes or skin necrosis: unknown Has patient had a PCN reaction that required hospitalization: unknown Has patient had a PCN reaction occurring within the last 10 years: no If all of the above answers are "NO", then may proceed  with Cephalosporin use.    Family History  Problem Relation Age of Onset   Diabetes Mother    Prostate cancer Father    Hypertension Brother    Hypertension Brother    Diabetes Brother    Stroke Brother    Diabetes Brother    Diabetes Daughter    Diabetes Daughter     Social History:  reports that he has never smoked. He has never used smokeless tobacco. He reports that he does not drink alcohol and does not use drugs.  ROS: A complete review of systems was performed.  All systems are negative except for pertinent findings as noted.  Physical Exam:  Vital signs in last 24 hours: BP (!) 152/77   Pulse (!) 57  Constitutional: Patient in wheelchair.  Not oriented to time or place Cardiovascular: Regular rate  Respiratory: Normal respiratory effort Lymphatic: No lymphadenopathy Neurologic: Grossly intact, no focal deficits Psychiatric: Normal mood and affect  I have  reviewed prior pt notes-from [facility]  I have reviewed most CT images.  Bladder fairly well decompressed.  No renal masses.  I have reviewed prior PSA results-most recent PSA was under 212 years ago  History obtained from the patient's wife   Impression/Assessment:  BPH with lower urinary tract symptoms, on tamsulosin  Incontinence, near total with no evidence of urinary retention, most likely secondary to his dementia  Plan:  I refilled his tamsulosin  No need for him to come back for urologic check up unless significant change in his urologic status as he is in palliative care

## 2022-09-22 NOTE — Addendum Note (Signed)
Addended byIris Pert on: 09/22/2022 04:08 PM   Modules accepted: Orders

## 2022-09-23 LAB — URINALYSIS, ROUTINE W REFLEX MICROSCOPIC
Bilirubin, UA: NEGATIVE
Glucose, UA: NEGATIVE
Ketones, UA: NEGATIVE
Nitrite, UA: NEGATIVE
RBC, UA: NEGATIVE
Specific Gravity, UA: 1.01 (ref 1.005–1.030)
Urobilinogen, Ur: 0.2 mg/dL (ref 0.2–1.0)
pH, UA: 5 (ref 5.0–7.5)

## 2022-09-23 LAB — MICROSCOPIC EXAMINATION

## 2022-09-28 ENCOUNTER — Other Ambulatory Visit: Payer: Self-pay | Admitting: Family Medicine

## 2022-09-28 ENCOUNTER — Other Ambulatory Visit: Payer: Self-pay | Admitting: Nurse Practitioner

## 2022-09-28 DIAGNOSIS — E038 Other specified hypothyroidism: Secondary | ICD-10-CM

## 2022-09-28 DIAGNOSIS — I152 Hypertension secondary to endocrine disorders: Secondary | ICD-10-CM

## 2022-09-28 DIAGNOSIS — I120 Hypertensive chronic kidney disease with stage 5 chronic kidney disease or end stage renal disease: Secondary | ICD-10-CM

## 2022-09-28 DIAGNOSIS — I639 Cerebral infarction, unspecified: Secondary | ICD-10-CM

## 2022-09-29 ENCOUNTER — Telehealth: Payer: Self-pay | Admitting: Family Medicine

## 2022-09-29 ENCOUNTER — Other Ambulatory Visit: Payer: Self-pay

## 2022-09-29 DIAGNOSIS — L98429 Non-pressure chronic ulcer of back with unspecified severity: Secondary | ICD-10-CM

## 2022-09-29 NOTE — Telephone Encounter (Signed)
Urgent referral to [facility] placed

## 2022-09-29 NOTE — Telephone Encounter (Signed)
[place], [place], [phone]  Called stating the wound on the pts bottom has reopened. She states she can send a pic to a number if needed (does not have accesses to his my chart). States he potentially may need home health services again. Please advise.

## 2022-09-30 ENCOUNTER — Telehealth: Payer: Medicare Other | Admitting: Family Medicine

## 2022-09-30 ENCOUNTER — Other Ambulatory Visit: Payer: Self-pay | Admitting: Family Medicine

## 2022-09-30 DIAGNOSIS — R5381 Other malaise: Secondary | ICD-10-CM

## 2022-09-30 DIAGNOSIS — L98429 Non-pressure chronic ulcer of back with unspecified severity: Secondary | ICD-10-CM

## 2022-09-30 DIAGNOSIS — Z9181 History of falling: Secondary | ICD-10-CM

## 2022-10-01 ENCOUNTER — Telehealth (INDEPENDENT_AMBULATORY_CARE_PROVIDER_SITE_OTHER): Payer: Medicare Other | Admitting: Family Medicine

## 2022-10-01 ENCOUNTER — Encounter: Payer: Self-pay | Admitting: Family Medicine

## 2022-10-01 DIAGNOSIS — L89301 Pressure ulcer of unspecified buttock, stage 1: Secondary | ICD-10-CM

## 2022-10-01 DIAGNOSIS — L89309 Pressure ulcer of unspecified buttock, unspecified stage: Secondary | ICD-10-CM | POA: Diagnosis not present

## 2022-10-01 DIAGNOSIS — L8990 Pressure ulcer of unspecified site, unspecified stage: Secondary | ICD-10-CM | POA: Insufficient documentation

## 2022-10-01 NOTE — Patient Instructions (Signed)
You are referred urgently  for home health to visit and manage the ulcer on your buttock

## 2022-10-01 NOTE — Progress Notes (Signed)
Virtual Visit via Video Note  I connected with Chase [name] on 10/01/22 at  9:00 AM EDT by a video enabled telemedicine application and verified that I am speaking with the correct person using two identifiers.  Location: Patient: home Provider: office   I discussed the limitations of evaluation and management by telemedicine and the availability of in person appointments. The patient expressed understanding and agreed to proceed.  History of Present Illness: 5 day h/o skin breakdown on buttock, no fever or drainage, skin in the sacrum stays macerated generally, but family uses [facility] and turns him often Requesting nursing assistance with new area of breakdown   Observations/Objective: There were no vitals taken for this visit. Stage 1 ulcer diamweter approx 2.5 cm visible on lower buttock  Assessment and Plan:  Decubitus ulcer H/H to evaluate and manage , high risk of complications and recurrence  Follow Up Instructions:    I discussed the assessment and treatment plan with the patient. The patient was provided an opportunity to ask questions and all were answered. The patient agreed with the plan and demonstrated an understanding of the instructions.   The patient was advised to call back or seek an in-person evaluation if the symptoms worsen or if the condition fails to improve as anticipated.  I provided 10 minutes of non-face-to-face time during this encounter.   Tula Nakayama, MD

## 2022-10-01 NOTE — Assessment & Plan Note (Signed)
H/H to evaluate and manage , high risk of complications and recurrence

## 2022-10-04 NOTE — Progress Notes (Signed)
Unable to connect with pt, appt rescheduled

## 2022-10-06 ENCOUNTER — Ambulatory Visit (INDEPENDENT_AMBULATORY_CARE_PROVIDER_SITE_OTHER): Payer: Medicare Other | Admitting: *Deleted

## 2022-10-06 ENCOUNTER — Ambulatory Visit: Payer: Medicare Other | Admitting: *Deleted

## 2022-10-06 ENCOUNTER — Encounter: Payer: Self-pay | Admitting: *Deleted

## 2022-10-06 DIAGNOSIS — Z794 Long term (current) use of insulin: Secondary | ICD-10-CM | POA: Diagnosis not present

## 2022-10-06 DIAGNOSIS — I129 Hypertensive chronic kidney disease with stage 1 through stage 4 chronic kidney disease, or unspecified chronic kidney disease: Secondary | ICD-10-CM | POA: Diagnosis not present

## 2022-10-06 DIAGNOSIS — L89322 Pressure ulcer of left buttock, stage 2: Secondary | ICD-10-CM | POA: Diagnosis not present

## 2022-10-06 DIAGNOSIS — N184 Chronic kidney disease, stage 4 (severe): Secondary | ICD-10-CM | POA: Diagnosis not present

## 2022-10-06 DIAGNOSIS — I152 Hypertension secondary to endocrine disorders: Secondary | ICD-10-CM

## 2022-10-06 DIAGNOSIS — Z515 Encounter for palliative care: Secondary | ICD-10-CM | POA: Diagnosis not present

## 2022-10-06 DIAGNOSIS — E1122 Type 2 diabetes mellitus with diabetic chronic kidney disease: Secondary | ICD-10-CM

## 2022-10-06 DIAGNOSIS — E1121 Type 2 diabetes mellitus with diabetic nephropathy: Secondary | ICD-10-CM

## 2022-10-06 DIAGNOSIS — F01B11 Vascular dementia, moderate, with agitation: Secondary | ICD-10-CM | POA: Diagnosis not present

## 2022-10-06 NOTE — Chronic Care Management (AMB) (Signed)
   09/27/2022  MONTANA FASSNACHT 02/21/1931 [id number]   Care plan updated and resolved, transferred to new CCM platform / documentation.  Jacqlyn Larsen [facility], BSN RN Case Manager [facility] [phone]

## 2022-10-06 NOTE — Chronic Care Management (AMB) (Signed)
Chronic Care Management Provider Comprehensive Care Plan    09/07/2022 Name: Chase [name] MRN: [id number] DOB: December 20, 1930  Referral to Chronic Care Management (CCM) services was placed by Provider:  Tula Nakayama on Date: 09/28/22.  Chronic Condition 1: Hypertension Provider Assessment and Plan  Renovascular hypertension Controlled, no change in medication  Expected Outcome/Goals Addressed This Visit (Provider CCM goals/Provider Assessment and plan   Symptom Management Condition 1: Take all medications as prescribed Attend all scheduled provider appointments Call pharmacy for medication refills 3-7 days in advance of running out of medications Call provider office for new concerns or questions  check blood pressure weekly choose a place to take my blood pressure (home, clinic or office, retail store) write blood pressure results in a log or diary keep a blood pressure log take blood pressure log to all doctor appointments keep all doctor appointments take medications for blood pressure exactly as prescribed eat more whole grains, fruits and vegetables, lean meats and healthy fats Change positions every 2 hours- this is very important Please call doctor if wound to bottom looks worse (redness, increased drainage, odor, fever)  Chronic Condition 2: Diabetes Provider Assessment and Plan  Problem: Type 2 diabetes mellitus with diabetic nephropathy ([place])  Editor: [name], MD (Physician)                Chase [name] is reminded of the importance of commitment to daily physical activity for 30 minutes or more, as able and the need to limit carbohydrate intake to 30 to 60 grams per meal to help with blood sugar control.    The need to take medication as prescribed, test blood sugar as directed, and to call between visits if there is a concern that blood sugar is uncontrolled is also discussed.    Chase [name] is reminded of the importance of daily foot exam, annual eye  examination, and good blood sugar, blood pressure and cholesterol control.       Latest Ref Rng & Units 04/08/2022    2:15 PM 10/06/2021    1:21 PM 05/13/2021    1:30 PM 04/03/2021    1:25 PM 03/27/2021    9:58 AM  Diabetic Labs  HbA1c 0.0 - 7.0 % 5.8  5.7    5.9     Chol 100 - 199 mg/dL         169   HDL >39 mg/dL         81   Calc LDL 0 - 99 mg/dL         80   Triglycerides 0 - 149 mg/dL         32   Creatinine 0.61 - 1.24 mg/dL     3.25    3.67          Expected Outcome/Goals Addressed This Visit (Provider CCM goals/Provider Assessment and plan   Symptom Management Condition 2: Take all medications as prescribed Attend all scheduled provider appointments Call pharmacy for medication refills 3-7 days in advance of running out of medications Call provider office for new concerns or questions  check blood sugar at prescribed times: Freestyle Libre  check feet daily for cuts, sores or redness take the blood sugar log to all doctor visits take the blood sugar meter to all doctor visits trim toenails straight across drink 6 to 8 glasses of water each day fill half of plate with vegetables prepare main meal at home 3 to 5 days each week read food labels for  fat, fiber, carbohydrates and portion size Continue to work with palliative care nurse monthly Report any new symptoms/ change in health status to your doctor   Chronic Condition 3: Chronic Kidney Disease stage 4 Provider Assessment and Plan Patient came in for a regular scheduled visit Patient offers no new specific physical complaints. I then examined the patient and discussed the lab results -Patient blood pressure is at goal.  -I discussed with the patient that his most recent lab data show that kidney function/GFR/creatinine is at 3.12, Educated patient that the kidney function is within baseline.  -I discussed with the patient that his hemoglobin is at 10.0, low but stable.  -I discussed with the patient that his  proteinuria is stable . -I discussed with the patient about his PTH being at goal.   Expected Outcome/ Goals Addressed This Visit (Provider CCM goals/ Provider Assessment and plan  Symptom Management Condition 3: Take medications as prescribed   Attend all scheduled provider appointments Call pharmacy for medication refills 3-7 days in advance of running out of medications Call provider office for new concerns or questions  Choose water as main beverage Read labels for sodium content/ avoid salty snacks and fast food Eat small portions of protein foods  Problem List Patient Active Problem List   Diagnosis Date Noted   Decubitus ulcer 10/01/2022   Dementia ([place]) 07/19/2022   Sacral ulcer ([place]) 05/06/2022   Delusional disorder ([place]) 03/16/2022   Aggressive behavior 03/16/2022   Poor appetite 03/16/2022   COVID-19 08/08/2021   Epilepsy ([place]) 11/07/2020   Atherosclerosis of native artery of left lower extremity with ulceration of ankle ([place]) 08/23/2020   Loose stools 10/28/2019   Incontinence 07/08/2019   Osteoarthritis of left knee 04/15/2019   Diarrhea 04/15/2019   Common bile duct stone 04/18/2018   Cholecystocutaneous fistula    Physical deconditioning 02/28/2018   Choledocholithiasis 02/25/2018   Hyperkalemia 12/75/1700   Metabolic bone disease 17/49/4496   Fistula of bile duct 02/14/2018   Dermatitis 02/25/2017   Essential hypertension, benign 12/13/2015   RBBB (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 fascicular block)    Hypertensive heart disease 12/01/2014   Chronic venous insufficiency 11/16/2014   Carotid artery disease ([place]) 05/18/2014   Poor balance 09/11/2013   Difficulty walking 09/11/2013   Hx of falling 09/11/2013   History of stroke 09/07/2012   CKD (chronic kidney disease) stage 4, GFR 15-29 ml/min ([place]) 09/07/2012   Anemia of chronic disease 09/07/2012   Cholelithiasis 08/10/2012   Peripheral autonomic neuropathy due to diabetes mellitus ([place])  03/14/2012   Seizure disorder, complex partial ([place]) 03/07/2012   Type 2 diabetes mellitus with diabetic nephropathy ([place])    Hypothyroidism 03/09/2008   Mixed hyperlipidemia 03/09/2008   Renovascular hypertension 03/09/2008   Peripheral vascular disease ([place]) 03/09/2008   DEGENERATIVE JOINT DISEASE, KNEE 01/26/2008   Osteoarthritis of spine 01/26/2008   Spinal stenosis     Medication Management  Current Outpatient Medications:    amLODipine (NORVASC) 10 MG tablet, TAKE 1 TABLET DAILY, Disp: 90 tablet, Rfl: 3   calcium carbonate (TUMS - DOSED IN MG ELEMENTAL CALCIUM) 500 MG chewable tablet, Chew 0.5 tablets by mouth 2 (two) times daily., Disp: , Rfl:    clopidogrel (PLAVIX) 75 MG tablet, TAKE 1 TABLET DAILY, Disp: 90 tablet, Rfl: 3   diphenoxylate-atropine (LOMOTIL) 2.5-0.025 MG tablet, Take one tablet once daily as needed, for diarrheah, Disp: 30 tablet, Rfl: 2   donepezil (ARICEPT) 5 MG tablet, Take 5 mg by  mouth daily., Disp: , Rfl:    famotidine (PEPCID) 20 MG tablet, Take 20 mg by mouth 2 (two) times daily., Disp: , Rfl:    hydrALAZINE (APRESOLINE) 25 MG tablet, Take 25 mg by mouth in the morning and at bedtime., Disp: , Rfl:    hydrOXYzine (ATARAX) 10 MG tablet, Take one tablet by mouth once daily, as needed, for agitiation, Disp: 14 tablet, Rfl: 1   levETIRAcetam (KEPPRA) 100 MG/ML solution, Take 500 mg by mouth daily., Disp: , Rfl:    levothyroxine (SYNTHROID) 112 MCG tablet, TAKE 1 TABLET DAILY BEFORE BREAKFAST, Disp: 90 tablet, Rfl: 3   Multiple Vitamins-Minerals (MULTIVITAMIN WITH MINERALS) tablet, Take 1 tablet by mouth daily., Disp: , Rfl:    Nystatin (GERHARDT'S BUTT CREAM) CREA, Apply sparingly twice daily to affected area, Disp: 1 each, Rfl: 1   olopatadine (PATANOL) 0.1 % ophthalmic solution, INSTILL 1 DROP INTO EACH EYE TWICE DAILY, Disp: 5 mL, Rfl: 12   ondansetron (ZOFRAN-ODT) 8 MG disintegrating tablet, Take 8 mg by mouth every 8 (eight) hours as needed., Disp: , Rfl:     polyethylene glycol powder (GLYCOLAX/MIRALAX) 17 GM/SCOOP powder, Take 17 g by mouth daily. (Patient taking differently: Take 17 g by mouth daily as needed.), Disp: 3350 g, Rfl: 3   pravastatin (PRAVACHOL) 20 MG tablet, TAKE 1 TABLET DAILY, Disp: 90 tablet, Rfl: 3   sodium bicarbonate 650 MG tablet, Take 650 mg by mouth 3 (three) times daily., Disp: , Rfl:    tamsulosin (FLOMAX) 0.4 MG CAPS capsule, Take 1 capsule (0.4 mg total) by mouth daily., Disp: 90 capsule, Rfl: 3  Cognitive Assessment Identity Confirmed: : Name; DOB (HIPAA verified per spouse and daughter) Cognitive Status: Abnormal (dementia)   Functional Assessment Hearing Difficulty or Deaf: no Wear Glasses or Blind: no Concentrating, Remembering or Making Decisions Difficulty (CP): yes Concentration Management: dementia Difficulty Communicating: no Difficulty Eating/Swallowing: no Walking or Climbing Stairs Difficulty: yes Walking or Climbing Stairs: ambulation difficulty, assistance 1 person Mobility Management: family assists Dressing/Bathing Difficulty: yes Dressing/Bathing: bathing difficulty, assistance 1 person Dressing/Bathing Management: famiy assists Doing Errands Independently Difficulty (such as shopping) (CP): yes Errands Management: family assists   Caregiver Assessment  Primary Source of Support/Comfort: child(ren); spouse Name of Support/Comfort Primary Source: spouse Marquice Uddin and daughters assist frequently People in Home: spouse Name(s) of People in Home: spouse Keison Glendinning   Planned Interventions  Evaluation of current treatment plan related to hypertension self management and patient's adherence to plan as established by provider;   Reviewed prescribed diet low sodium (pt has written diet information)  Reviewed medications with patient and discussed importance of compliance;  Discussed plans with patient for ongoing care management follow up and provided patient with direct contact  information for care management team; Advised patient, providing education and rationale, to monitor blood pressure daily and record, calling PCP for findings outside established parameters;  Discussed complications of poorly controlled blood pressure such as heart disease, stroke, circulatory complications, vision complications, kidney impairment, sexual dysfunction;  Screening for signs and symptoms of depression related to chronic disease state;  Assessed social determinant of health barriers;  Reviewed importance of changing positions q 2 hours Reviewed importance of good nutrition Reviewed signs/ symptoms of infection Provided education to patient about basic DM disease process; Reviewed medications with patient and discussed importance of medication adherence;        Reviewed prescribed diet with patient carbohydrate modified; Discussed plans with patient for ongoing care management follow up and provided  patient with direct contact information for care management team;      call provider for findings outside established parameters;       Review of patient status, including review of consultants reports, relevant laboratory and other test results, and medications completed;       Screening for signs and symptoms of depression related to chronic disease state;        Reviewed importance of good blood sugar control for wound healing Reviewed that wound or infection can elevate blood sugar Evaluation of current treatment plan related to chronic kidney disease self management and patient's adherence to plan as established by provider      Reviewed prescribed diet low sodium Reviewed medications with patient and discussed importance of compliance    Discussed complications of poorly controlled blood pressure such as heart disease, stroke, circulatory complications, vision complications, kidney impairment, sexual dysfunction    Reviewed scheduled/upcoming provider appointments including     Discussed plans with patient for ongoing care management follow up and provided patient with direct contact information for care management team    Screening for signs and symptoms of depression related to chronic disease state      Assessed social determinant of health barriers    Reviewed importance of eating small portions of protein foods Reviewed choosing water to drink Interaction and coordination with outside resources, practitioners, and providers See CCM Referral  Care Plan: Patient declined

## 2022-10-06 NOTE — Chronic Care Management (AMB) (Signed)
Chronic Care Management   CCM RN Visit Note  09/20/2022 Name: Chase [name] MRN: [id number] DOB: 27-Mar-1931  Subjective: Chase [name] is a 88 y.o. year old male who is a primary care patient of Chase Helper, MD. The patient was referred to the Chronic Care Management team for assistance with care management needs subsequent to provider initiation of CCM services and plan of care.    Today's Visit:  Engaged with patient by telephone for initial visit.     SDOH Interventions Today    Flowsheet Row Most Recent Value  SDOH Interventions   Food Insecurity Interventions Intervention Not Indicated  Housing Interventions Intervention Not Indicated  Transportation Interventions Intervention Not Indicated  [pt utilizes [facility] and daughters also assist]  Utilities Interventions Intervention Not Indicated  Financial Strain Interventions Intervention Not Indicated  Physical Activity Interventions Other (Comments), Patient Refused  [dementia]  Stress Interventions Intervention Not Indicated, Other (Comment)  [dementia]  Social Connections Interventions Intervention Not Indicated, Other (Comment)  [pt has dementia, difficult to leave home]         Goals Addressed             This Visit's Progress    CCM (CHRONIC KIDNEY DISEASE) EXPECTED OUTCOME: MONITOR, SELF-MANAGE AND REDUCE SYMPTOMS OF CHRONIC KIDNEY DISEASE       Current Barriers:  Knowledge Deficits related to CKD stage 4 Chronic Disease Management support and education needs related to CKD stage 4 Cognitive Deficits No Advanced Directives in place- family declines  Planned Interventions: Evaluation of current treatment plan related to chronic kidney disease self management and patient's adherence to plan as established by provider      Reviewed prescribed diet low sodium Reviewed medications with patient and discussed importance of compliance    Discussed complications of poorly controlled blood pressure such as heart  disease, stroke, circulatory complications, vision complications, kidney impairment, sexual dysfunction    Reviewed scheduled/upcoming provider appointments including    Discussed plans with patient for ongoing care management follow up and provided patient with direct contact information for care management team    Screening for signs and symptoms of depression related to chronic disease state      Assessed social determinant of health barriers    Reviewed importance of eating small portions of protein foods Reviewed choosing water to drink  Symptom Management: Take medications as prescribed   Attend all scheduled provider appointments Call pharmacy for medication refills 3-7 days in advance of running out of medications Call provider office for new concerns or questions  Choose water as main beverage Read labels for sodium content/ avoid salty snacks and fast food Eat small portions of protein foods  Follow Up Plan: Telephone follow up appointment with care management team member scheduled for:  11/03/22 at 215 pm       CCM (DIABETES) EXPECTED OUTCOME:  MONITOR, SELF-MANAGE AND REDUCE SYMPTOMS OF DIABETES       Current Barriers:  Knowledge Deficits related to Diabetes Chronic Disease Management support and education needs related to Diabetes and diet Cognitive Deficits No Advanced Directives in place- family declines Per spouse, CBG fasting ranges are usually 85-100's with some readings at 200, uses Freestyle Libre to monitor, reports pt has all medications and taking as prescribed  Planned Interventions: Provided education to patient about basic DM disease process; Reviewed medications with patient and discussed importance of medication adherence;        Reviewed prescribed diet with patient carbohydrate modified; Discussed plans with patient  for ongoing care management follow up and provided patient with direct contact information for care management team;      call provider for  findings outside established parameters;       Review of patient status, including review of consultants reports, relevant laboratory and other test results, and medications completed;       Screening for signs and symptoms of depression related to chronic disease state;        Reviewed importance of good blood sugar control for wound healing Reviewed that wound or infection can elevate blood sugar  Symptom Management: Take medications as prescribed   Attend all scheduled provider appointments Call pharmacy for medication refills 3-7 days in advance of running out of medications Call provider office for new concerns or questions  check blood sugar at prescribed times: Freestyle Libre  check feet daily for cuts, sores or redness take the blood sugar log to all doctor visits take the blood sugar meter to all doctor visits trim toenails straight across drink 6 to 8 glasses of water each day fill half of plate with vegetables prepare main meal at home 3 to 5 days each week read food labels for fat, fiber, carbohydrates and portion size Continue to work with palliative care nurse monthly Report any new symptoms/ change in health status to your doctor   Follow Up Plan: Telephone follow up appointment with care management team member scheduled for:  11/03/22 at 215 pm       CCM (HYPERTENSION) EXPECTED OUTCOME: MONITOR, SELF-MANAGE AND REDUCE SYMPTOMS OF HYPERTENSION       Current Barriers:  Knowledge Deficits related to Hypertension Chronic Disease Management support and education needs related to Hypertension, diet Cognitive Deficits No Advanced Directives in place- family declines Spoke with patient's wife Chase [name] and daughter was present, patient's adult daughters are very active in the care of pt, pt has aide that comes in twice daily, morning and afternoon and assists with ADL's for the patient, pt has WC, walker, cane, patient has dementia and is on aricept, family does not monitor  blood pressure and states palliative nurse sees pt once per month and checks blood pressure, pt has Stage 1 ulcer on lower buttock area, family has been dressing this area and states it looks better, family states it is very difficult to get patient to change positions q 2 hours.  Planned Interventions: Evaluation of current treatment plan related to hypertension self management and patient's adherence to plan as established by provider;   Reviewed prescribed diet low sodium (pt has written diet information)  Reviewed medications with patient and discussed importance of compliance;  Discussed plans with patient for ongoing care management follow up and provided patient with direct contact information for care management team; Advised patient, providing education and rationale, to monitor blood pressure daily and record, calling PCP for findings outside established parameters;  Discussed complications of poorly controlled blood pressure such as heart disease, stroke, circulatory complications, vision complications, kidney impairment, sexual dysfunction;  Screening for signs and symptoms of depression related to chronic disease state;  Assessed social determinant of health barriers;  Reviewed importance of changing positions q 2 hours Reviewed importance of good nutrition Reviewed signs/ symptoms of infection  Symptom Management: Take medications as prescribed   Attend all scheduled provider appointments Call pharmacy for medication refills 3-7 days in advance of running out of medications Perform all self care activities independently  Call provider office for new concerns or questions  check blood pressure  weekly choose a place to take my blood pressure (home, clinic or office, retail store) write blood pressure results in a log or diary keep a blood pressure log take blood pressure log to all doctor appointments keep all doctor appointments take medications for blood pressure exactly as  prescribed eat more whole grains, fruits and vegetables, lean meats and healthy fats Change positions every 2 hours- this is very important Please call doctor if wound to bottom looks worse (redness, increased drainage, odor, fever)  Follow Up Plan: Telephone follow up appointment with care management team member scheduled for: 11/03/22 at 215 pm          Plan:Telephone follow up appointment with care management team member scheduled for:  11/03/22 at 215 pm  Jacqlyn Larsen [facility], BSN RN Case Manager [facility] [phone]

## 2022-10-06 NOTE — Patient Instructions (Signed)
Please call the care guide team at [phone] if you need to cancel or reschedule your appointment.   If you are experiencing a Mental Health or [place] or need someone to talk to, please call the Suicide and Crisis Lifeline: 988 call the [place] National Suicide Prevention Lifeline: (630)304-5934 or TTY: (202) 800-9640 TTY 646-155-4119) to talk to a trained counselor call 1-800-273-TALK (toll free, 24 hour hotline) go to [facility] [address] [place] [phone]) call the [place]: [phone] call 911   Following is a copy of your full provider care plan:   Goals Addressed             This Visit's Progress    CCM ([place]) EXPECTED OUTCOME: MONITOR, SELF-MANAGE AND REDUCE SYMPTOMS OF CHRONIC KIDNEY DISEASE       Current Barriers:  Knowledge Deficits related to CKD stage 4 Chronic Disease Management support and education needs related to CKD stage 4 Cognitive Deficits No Advanced Directives in place- family declines  Planned Interventions: Evaluation of current treatment plan related to chronic kidney disease self management and patient's adherence to plan as established by provider      Reviewed prescribed diet low sodium Reviewed medications with patient and discussed importance of compliance    Discussed complications of poorly controlled blood pressure such as heart disease, stroke, circulatory complications, vision complications, kidney impairment, sexual dysfunction    Reviewed scheduled/upcoming provider appointments including    Discussed plans with patient for ongoing care management follow up and provided patient with direct contact information for care management team    Screening for signs and symptoms of depression related to chronic disease state      Assessed social determinant of health barriers    Reviewed importance of eating small portions of protein foods Reviewed  choosing water to drink  Symptom Management: Take medications as prescribed   Attend all scheduled provider appointments Call pharmacy for medication refills 3-7 days in advance of running out of medications Call provider office for new concerns or questions  Choose water as main beverage Read labels for sodium content/ avoid salty snacks and fast food Eat small portions of protein foods  Follow Up Plan: Telephone follow up appointment with care management team member scheduled for:  11/03/22 at 215 pm       CCM (DIABETES) EXPECTED OUTCOME:  MONITOR, SELF-MANAGE AND REDUCE SYMPTOMS OF DIABETES       Current Barriers:  Knowledge Deficits related to Diabetes Chronic Disease Management support and education needs related to Diabetes and diet Cognitive Deficits No Advanced Directives in place- family declines Per spouse, CBG fasting ranges are usually 85-100's with some readings at 200, uses Freestyle Libre to monitor, reports pt has all medications and taking as prescribed  Planned Interventions: Provided education to patient about basic DM disease process; Reviewed medications with patient and discussed importance of medication adherence;        Reviewed prescribed diet with patient carbohydrate modified; Discussed plans with patient for ongoing care management follow up and provided patient with direct contact information for care management team;      call provider for findings outside established parameters;       Review of patient status, including review of consultants reports, relevant laboratory and other test results, and medications completed;       Screening for signs and symptoms of depression related to chronic disease state;        Reviewed importance of  good blood sugar control for wound healing Reviewed that wound or infection can elevate blood sugar  Symptom Management: Take medications as prescribed   Attend all scheduled provider appointments Call pharmacy for  medication refills 3-7 days in advance of running out of medications Call provider office for new concerns or questions  check blood sugar at prescribed times: Freestyle Libre  check feet daily for cuts, sores or redness take the blood sugar log to all doctor visits take the blood sugar meter to all doctor visits trim toenails straight across drink 6 to 8 glasses of water each day fill half of plate with vegetables prepare main meal at home 3 to 5 days each week read food labels for fat, fiber, carbohydrates and portion size Continue to work with palliative care nurse monthly Report any new symptoms/ change in health status to your doctor   Follow Up Plan: Telephone follow up appointment with care management team member scheduled for:  11/03/22 at 215 pm       CCM (HYPERTENSION) EXPECTED OUTCOME: MONITOR, SELF-MANAGE AND REDUCE SYMPTOMS OF HYPERTENSION       Current Barriers:  Knowledge Deficits related to Hypertension Chronic Disease Management support and education needs related to Hypertension, diet Cognitive Deficits No Advanced Directives in place- family declines Spoke with patient's wife Heron Pitcock and daughter was present, patient's adult daughters are very active in the care of pt, pt has aide that comes in twice daily, morning and afternoon and assists with ADL's for the patient, pt has WC, walker, cane, patient has dementia and is on aricept, family does not monitor blood pressure and states palliative nurse sees pt once per month and checks blood pressure, pt has Stage 1 ulcer on lower buttock area, family has been dressing this area and states it looks better, family states it is very difficult to get patient to change positions q 2 hours.  Planned Interventions: Evaluation of current treatment plan related to hypertension self management and patient's adherence to plan as established by provider;   Reviewed prescribed diet low sodium (pt has written diet information)   Reviewed medications with patient and discussed importance of compliance;  Discussed plans with patient for ongoing care management follow up and provided patient with direct contact information for care management team; Advised patient, providing education and rationale, to monitor blood pressure daily and record, calling PCP for findings outside established parameters;  Discussed complications of poorly controlled blood pressure such as heart disease, stroke, circulatory complications, vision complications, kidney impairment, sexual dysfunction;  Screening for signs and symptoms of depression related to chronic disease state;  Assessed social determinant of health barriers;  Reviewed importance of changing positions q 2 hours Reviewed importance of good nutrition Reviewed signs/ symptoms of infection  Symptom Management: Take medications as prescribed   Attend all scheduled provider appointments Call pharmacy for medication refills 3-7 days in advance of running out of medications Perform all self care activities independently  Call provider office for new concerns or questions  check blood pressure weekly choose a place to take my blood pressure (home, clinic or office, retail store) write blood pressure results in a log or diary keep a blood pressure log take blood pressure log to all doctor appointments keep all doctor appointments take medications for blood pressure exactly as prescribed eat more whole grains, fruits and vegetables, lean meats and healthy fats Change positions every 2 hours- this is very important Please call doctor if wound to bottom looks worse (redness, increased  drainage, odor, fever)  Follow Up Plan: Telephone follow up appointment with care management team member scheduled for: 11/03/22 at 215 pm          The patient verbalized understanding of instructions, educational materials, and care plan provided today and DECLINED offer to receive copy of  patient instructions, educational materials, and care plan.   Telephone follow up appointment with care management team member scheduled for:  11/03/22 at 215 pm

## 2022-10-07 DIAGNOSIS — 419620001 Death: Secondary | SNOMED CT | POA: Diagnosis not present

## 2022-10-07 DEATH — deceased

## 2022-10-09 DIAGNOSIS — N184 Chronic kidney disease, stage 4 (severe): Secondary | ICD-10-CM | POA: Diagnosis not present

## 2022-10-12 ENCOUNTER — Telehealth: Payer: Self-pay | Admitting: Family Medicine

## 2022-10-12 NOTE — Telephone Encounter (Signed)
Lvm letting her know

## 2022-10-12 NOTE — Telephone Encounter (Signed)
Spouse called in on patient behalf, patients hand is swollen and wants to know what would help with swelling

## 2022-10-28 ENCOUNTER — Other Ambulatory Visit: Payer: Self-pay | Admitting: Family Medicine

## 2022-11-03 ENCOUNTER — Ambulatory Visit (INDEPENDENT_AMBULATORY_CARE_PROVIDER_SITE_OTHER): Payer: Medicare Other | Admitting: *Deleted

## 2022-11-03 DIAGNOSIS — Z794 Long term (current) use of insulin: Secondary | ICD-10-CM

## 2022-11-03 DIAGNOSIS — I152 Hypertension secondary to endocrine disorders: Secondary | ICD-10-CM

## 2022-11-03 NOTE — Patient Instructions (Signed)
Please call the care guide team at [phone] if you need to cancel or reschedule your appointment.   If you are experiencing a Mental Health or [place] or need someone to talk to, please call the Suicide and Crisis Lifeline: 988 call the [place] National Suicide Prevention Lifeline: (857)154-4009 or TTY: (317) 538-6467 TTY 314-871-8457) to talk to a trained counselor call 1-800-273-TALK (toll free, 24 hour hotline) go to [facility] [address] [place] [phone]) call the [place]: [phone] call 911   Following is a copy of your full provider care plan:   Goals Addressed             This Visit's Progress    CCM ([place]) EXPECTED OUTCOME: MONITOR, SELF-MANAGE AND REDUCE SYMPTOMS OF CHRONIC KIDNEY DISEASE       Current Barriers:  Knowledge Deficits related to CKD stage 4 Chronic Disease Management support and education needs related to CKD stage 4 Cognitive Deficits No Advanced Directives in place- family declines  Planned Interventions: Evaluation of current treatment plan related to chronic kidney disease self management and patient's adherence to plan as established by provider      Reviewed prescribed diet low sodium Reviewed medications with patient and discussed importance of compliance    Discussed complications of poorly controlled blood pressure such as heart disease, stroke, circulatory complications, vision complications, kidney impairment, sexual dysfunction    Reviewed scheduled/upcoming provider appointments including    Discussed plans with patient for ongoing care management follow up and provided patient with direct contact information for care management team    Screening for signs and symptoms of depression related to chronic disease state      Assessed social determinant of health barriers    Reinforced importance of eating small portions of protein  foods Reinforced choosing water to drink Reviewed importance of changing positions q 2 hours  Symptom Management: Take medications as prescribed   Attend all scheduled provider appointments Call pharmacy for medication refills 3-7 days in advance of running out of medications Call provider office for new concerns or questions  Choose water as main beverage Read labels for sodium content/ avoid salty snacks and fast food Eat small portions of protein foods Change patient's positions q 2 hours  Follow Up Plan: Telephone follow up appointment with care management team member scheduled for:  12/29/22 at 130 pm       CCM (DIABETES) EXPECTED OUTCOME:  MONITOR, SELF-MANAGE AND REDUCE SYMPTOMS OF DIABETES       Current Barriers:  Knowledge Deficits related to Diabetes Chronic Disease Management support and education needs related to Diabetes and diet Cognitive Deficits No Advanced Directives in place- family declines Per spouse, CBG fasting ranges are usually 85- low100's, random readings 94-149, uses Freestyle Libre to monitor, reports pt has all medications and taking as prescribed Spouse asks that RN care manager also speak with daughter Everton Bertha for update, per Adela Lank pt is supposed to have a new aide starting this Friday 11/06/22 due to last aide is no longer there.  Palliative care nurse is no longer seeing patient as spouse felt this was not a goof fit for them, hospice was mentioned and spouse does not feel pt is a candidate for hospice at present.  Planned Interventions: Provided education to patient about basic DM disease process; Reviewed medications with patient and discussed importance of medication adherence;        Reviewed prescribed diet with patient carbohydrate modified;  Counseled on importance of regular laboratory monitoring as prescribed;        call provider for findings outside established parameters;       Review of patient status, including review of consultants  reports, relevant laboratory and other test results, and medications completed;       Reinforced importance of good blood sugar control for wound healing Reinforced that wound or infection can elevate blood sugar  Symptom Management: Take medications as prescribed   Attend all scheduled provider appointments Call pharmacy for medication refills 3-7 days in advance of running out of medications Call provider office for new concerns or questions  check blood sugar at prescribed times: Freestyle Libre  check feet daily for cuts, sores or redness take the blood sugar log to all doctor visits take the blood sugar meter to all doctor visits trim toenails straight across fill half of plate with vegetables prepare main meal at home 3 to 5 days each week read food labels for fat, fiber, carbohydrates and portion size Report any new symptoms/ change in health status to your doctor   Follow Up Plan: Telephone follow up appointment with care management team member scheduled for:  12/29/22 at 130 pm       CCM (HYPERTENSION) EXPECTED OUTCOME: MONITOR, SELF-MANAGE AND REDUCE SYMPTOMS OF HYPERTENSION       Current Barriers:  Knowledge Deficits related to Hypertension Chronic Disease Management support and education needs related to Hypertension, diet Cognitive Deficits No Advanced Directives in place- family declines Spoke with patient's wife Bradley Bostelman and daughter Quamere Mussell, patient's adult daughters are very active in the care of pt, pt has aide that comes in twice daily, morning and afternoon and assists with ADL's for the patient, pt has WC, walker, cane, patient has dementia and is on aricept, family does not monitor blood pressure and states palliative nurse no longer sees pt (per family request), pt has Stage 1 ulcer on lower buttock area which is much better per spouse and daughter, family has been dressing this area, family states it is very difficult to get patient to change positions q 2  hours, there is now a caregiver who comes in at night to get pt into bed and this is working out well.  Pt will have new aide through [employer] that is supposed to start Friday 11/06/22.  Planned Interventions: Evaluation of current treatment plan related to hypertension self management and patient's adherence to plan as established by provider;   Reviewed prescribed diet low sodium (pt has written diet information)  Reviewed medications with patient and discussed importance of compliance;  Discussed plans with patient for ongoing care management follow up and provided patient with direct contact information for care management team; Advised patient, providing education and rationale, to monitor blood pressure daily and record, calling PCP for findings outside established parameters;  Discussed complications of poorly controlled blood pressure such as heart disease, stroke, circulatory complications, vision complications, kidney impairment, sexual dysfunction;  Reinforced importance of changing positions q 2 hours Reinforced importance of good nutrition Reinforced signs/ symptoms of infection  Symptom Management: Take medications as prescribed   Attend all scheduled provider appointments Call pharmacy for medication refills 3-7 days in advance of running out of medications Perform all self care activities independently  Call provider office for new concerns or questions  check blood pressure weekly choose a place to take my blood pressure (home, clinic or office, retail store) write blood pressure results in a log or diary keep  a blood pressure log take blood pressure log to all doctor appointments keep all doctor appointments take medications for blood pressure exactly as prescribed report new symptoms to your doctor eat more whole grains, fruits and vegetables, lean meats and healthy fats Follow low sodium diet Change positions every 2 hours- this is very important Please call doctor if  wound to bottom looks worse (redness, increased drainage, odor, fever)  Follow Up Plan: Telephone follow up appointment with care management team member scheduled for: 12/29/22 at 130 pm          The patient verbalized understanding of instructions, educational materials, and care plan provided today and DECLINED offer to receive copy of patient instructions, educational materials, and care plan.   Telephone follow up appointment with care management team member scheduled for:  12/29/22 at 130 pm

## 2022-11-03 NOTE — Chronic Care Management (AMB) (Signed)
Chronic Care Management   CCM RN Visit Note  11/03/2022 Name: Chase [name] MRN: [id number] DOB: January 28, 1931  Subjective: Chase [name] is a 88 y.o. year old male who is a primary care patient of Chase Helper, MD. The patient was referred to the Chronic Care Management team for assistance with care management needs subsequent to provider initiation of CCM services and plan of care.    Today's Visit:  Engaged with patient by telephone for follow up visit.        Goals Addressed             This Visit's Progress    CCM (CHRONIC KIDNEY DISEASE) EXPECTED OUTCOME: MONITOR, SELF-MANAGE AND REDUCE SYMPTOMS OF CHRONIC KIDNEY DISEASE       Current Barriers:  Knowledge Deficits related to CKD stage 4 Chronic Disease Management support and education needs related to CKD stage 4 Cognitive Deficits No Advanced Directives in place- family declines  Planned Interventions: Evaluation of current treatment plan related to chronic kidney disease self management and patient's adherence to plan as established by provider      Reviewed prescribed diet low sodium Reviewed medications with patient and discussed importance of compliance    Discussed complications of poorly controlled blood pressure such as heart disease, stroke, circulatory complications, vision complications, kidney impairment, sexual dysfunction    Reviewed scheduled/upcoming provider appointments including    Discussed plans with patient for ongoing care management follow up and provided patient with direct contact information for care management team    Screening for signs and symptoms of depression related to chronic disease state      Assessed social determinant of health barriers    Reinforced importance of eating small portions of protein foods Reinforced choosing water to drink Reviewed importance of changing positions q 2 hours  Symptom Management: Take medications as prescribed   Attend all scheduled provider  appointments Call pharmacy for medication refills 3-7 days in advance of running out of medications Call provider office for new concerns or questions  Choose water as main beverage Read labels for sodium content/ avoid salty snacks and fast food Eat small portions of protein foods Change patient's positions q 2 hours  Follow Up Plan: Telephone follow up appointment with care management team member scheduled for:  12/29/22 at 130 pm       CCM (DIABETES) EXPECTED OUTCOME:  MONITOR, SELF-MANAGE AND REDUCE SYMPTOMS OF DIABETES       Current Barriers:  Knowledge Deficits related to Diabetes Chronic Disease Management support and education needs related to Diabetes and diet Cognitive Deficits No Advanced Directives in place- family declines Per spouse, CBG fasting ranges are usually 85- low100's, random readings 94-149, uses Freestyle Libre to monitor, reports pt has all medications and taking as prescribed Spouse asks that RN care manager also speak with daughter Chase [name] for update, per Chase [name] pt is supposed to have a new aide starting this Friday 11/06/22 due to last aide is no longer there.  Palliative care nurse is no longer seeing patient as spouse felt this was not a goof fit for them, hospice was mentioned and spouse does not feel pt is a candidate for hospice at present.  Planned Interventions: Provided education to patient about basic DM disease process; Reviewed medications with patient and discussed importance of medication adherence;        Reviewed prescribed diet with patient carbohydrate modified; Counseled on importance of regular laboratory monitoring as prescribed;  call provider for findings outside established parameters;       Review of patient status, including review of consultants reports, relevant laboratory and other test results, and medications completed;       Reinforced importance of good blood sugar control for wound healing Reinforced that wound or  infection can elevate blood sugar  Symptom Management: Take medications as prescribed   Attend all scheduled provider appointments Call pharmacy for medication refills 3-7 days in advance of running out of medications Call provider office for new concerns or questions  check blood sugar at prescribed times: Freestyle Libre  check feet daily for cuts, sores or redness take the blood sugar log to all doctor visits take the blood sugar meter to all doctor visits trim toenails straight across fill half of plate with vegetables prepare main meal at home 3 to 5 days each week read food labels for fat, fiber, carbohydrates and portion size Report any new symptoms/ change in health status to your doctor   Follow Up Plan: Telephone follow up appointment with care management team member scheduled for:  12/29/22 at 130 pm       CCM (HYPERTENSION) EXPECTED OUTCOME: MONITOR, SELF-MANAGE AND REDUCE SYMPTOMS OF HYPERTENSION       Current Barriers:  Knowledge Deficits related to Hypertension Chronic Disease Management support and education needs related to Hypertension, diet Cognitive Deficits No Advanced Directives in place- family declines Spoke with patient's wife Chase [name] and daughter Chase [name], patient's adult daughters are very active in the care of pt, pt has aide that comes in twice daily, morning and afternoon and assists with ADL's for the patient, pt has WC, walker, cane, patient has dementia and is on aricept, family does not monitor blood pressure and states palliative nurse no longer sees pt (per family request), pt has Stage 1 ulcer on lower buttock area which is much better per spouse and daughter, family has been dressing this area, family states it is very difficult to get patient to change positions q 2 hours, there is now a caregiver who comes in at night to get pt into bed and this is working out well.  Pt will have new aide through [employer] that is supposed to start Friday  11/06/22.  Planned Interventions: Evaluation of current treatment plan related to hypertension self management and patient's adherence to plan as established by provider;   Reviewed prescribed diet low sodium (pt has written diet information)  Reviewed medications with patient and discussed importance of compliance;  Discussed plans with patient for ongoing care management follow up and provided patient with direct contact information for care management team; Advised patient, providing education and rationale, to monitor blood pressure daily and record, calling PCP for findings outside established parameters;  Discussed complications of poorly controlled blood pressure such as heart disease, stroke, circulatory complications, vision complications, kidney impairment, sexual dysfunction;  Reinforced importance of changing positions q 2 hours Reinforced importance of good nutrition Reinforced signs/ symptoms of infection  Symptom Management: Take medications as prescribed   Attend all scheduled provider appointments Call pharmacy for medication refills 3-7 days in advance of running out of medications Perform all self care activities independently  Call provider office for new concerns or questions  check blood pressure weekly choose a place to take my blood pressure (home, clinic or office, retail store) write blood pressure results in a log or diary keep a blood pressure log take blood pressure log to all doctor appointments keep all doctor appointments  take medications for blood pressure exactly as prescribed report new symptoms to your doctor eat more whole grains, fruits and vegetables, lean meats and healthy fats Follow low sodium diet Change positions every 2 hours- this is very important Please call doctor if wound to bottom looks worse (redness, increased drainage, odor, fever)  Follow Up Plan: Telephone follow up appointment with care management team member scheduled for:  12/29/22 at 130 pm          Plan:Telephone follow up appointment with care management team member scheduled for:  12/29/22 at 130 pm  Jacqlyn Larsen [facility], BSN RN Case Manager [facility] [phone]

## 2022-11-05 DIAGNOSIS — I129 Hypertensive chronic kidney disease with stage 1 through stage 4 chronic kidney disease, or unspecified chronic kidney disease: Secondary | ICD-10-CM | POA: Diagnosis not present

## 2022-11-05 DIAGNOSIS — N184 Chronic kidney disease, stage 4 (severe): Secondary | ICD-10-CM

## 2022-11-06 DIAGNOSIS — N184 Chronic kidney disease, stage 4 (severe): Secondary | ICD-10-CM | POA: Diagnosis not present

## 2022-11-10 ENCOUNTER — Telehealth: Payer: Self-pay | Admitting: Family Medicine

## 2022-11-10 NOTE — Telephone Encounter (Signed)
Spouse called in on patient behalf in regard to patient Leg machine ( helps keep swelling down)  Machine is no longer working. Patient/ spouse wants a call  back with what to do to get new machine.

## 2022-11-16 DIAGNOSIS — Z515 Encounter for palliative care: Secondary | ICD-10-CM | POA: Diagnosis not present

## 2022-11-16 DIAGNOSIS — L89322 Pressure ulcer of left buttock, stage 2: Secondary | ICD-10-CM | POA: Diagnosis not present

## 2022-11-16 DIAGNOSIS — F01B11 Vascular dementia, moderate, with agitation: Secondary | ICD-10-CM | POA: Diagnosis not present

## 2022-11-17 ENCOUNTER — Ambulatory Visit: Payer: Medicare Other | Admitting: Family Medicine

## 2022-11-17 NOTE — Telephone Encounter (Signed)
LMTRC

## 2022-11-23 ENCOUNTER — Telehealth: Payer: Self-pay | Admitting: Family Medicine

## 2022-11-23 NOTE — Telephone Encounter (Signed)
Patient spouse called said needs In health home care and has called both insurance company and patient can get in home health care, but provider needs to prescription and contact insurance and will pay for it.  Per patient daughter Helene Kelp that Medicare said the provider can Write prescription in home health care which includes bathing patient, getting u pout of bed and helping transfer from wheel chair to bed and bed to another chair. Minimum 4 hours a day.  Please contact  Helene Kelp back # [phone] or [phone] to let them know exactly what they need to do.   [employer] said provider will need to contact them directly for prior authorization. Phone # [phone].

## 2022-11-25 ENCOUNTER — Other Ambulatory Visit: Payer: Self-pay

## 2022-11-25 DIAGNOSIS — R5381 Other malaise: Secondary | ICD-10-CM

## 2022-11-25 NOTE — Telephone Encounter (Signed)
Referral place 

## 2022-11-26 ENCOUNTER — Telehealth: Payer: Self-pay | Admitting: Family Medicine

## 2022-11-26 NOTE — Telephone Encounter (Signed)
Chase [name] called from [facility] and said they can give Skilled Need but Medicare will not cover just 4 hours a day for personal care.  That is what Chase [name] thinks they are asking for after looking at notes in the file.  Chase [name] said this needs to be clarified with the family on what they are asking for.  If is is personal care medicare will not cover it.  Please call Chase [name] is you have questions.

## 2022-12-03 NOTE — Telephone Encounter (Signed)
Unable to reach patient no vm available.

## 2022-12-08 ENCOUNTER — Ambulatory Visit: Payer: Medicare Other | Admitting: Family Medicine

## 2022-12-08 NOTE — Telephone Encounter (Signed)
Helene Shoe returning nurse Erasmo Downer call.

## 2022-12-08 NOTE — Telephone Encounter (Signed)
Helene Shoe returning nurse Erasmo Downer call

## 2022-12-10 ENCOUNTER — Other Ambulatory Visit: Payer: Self-pay

## 2022-12-10 DIAGNOSIS — R5381 Other malaise: Secondary | ICD-10-CM

## 2022-12-10 MED ORDER — UNABLE TO FIND: 0 refills | Status: DC

## 2022-12-10 NOTE — Telephone Encounter (Signed)
Returned call

## 2022-12-11 ENCOUNTER — Telehealth: Payer: Self-pay | Admitting: *Deleted

## 2022-12-11 NOTE — Progress Notes (Signed)
  Care Coordination   Note   12/11/2022 Name: Chase [name] MRN: [id number] DOB: 10-16-1931  Chase [name] is a 89 y.o. year old male who sees [name], MD for primary care. I reached out to [name] by phone today to offer care coordination services.  [name] was given information about Care Coordination services today including:   The Care Coordination services include support from the care team which includes your Nurse Coordinator, Clinical Social Worker, or Pharmacist.  The Care Coordination team is here to help remove barriers to the health concerns and goals most important to you. Care Coordination services are voluntary, and the patient may decline or stop services at any time by request to their care team member.   Care Coordination Consent Status: Patient agreed to services and verbal consent obtained.   Follow up plan:  Telephone appointment with care coordination team member scheduled for:  12/28/22  Encounter Outcome:  Pt. Scheduled  [place]  Direct Dial: [phone]

## 2022-12-14 ENCOUNTER — Other Ambulatory Visit: Payer: Self-pay | Admitting: Family Medicine

## 2022-12-15 ENCOUNTER — Ambulatory Visit: Payer: Self-pay | Admitting: *Deleted

## 2022-12-15 ENCOUNTER — Encounter: Payer: Self-pay | Admitting: *Deleted

## 2022-12-15 NOTE — Patient Outreach (Signed)
Care Coordination   Initial Visit Note   12/15/2022  Name: Chase [name] MRN: [id number] DOB: [birth year]  Chase [name] is a 89 y.o. year old male who sees Moshe Cipro, [place] Levo, MD for primary care. I spoke with patient's daughter, Eloise Mula & Jeremaine Maraj by phone today.  What matters to the patients health and wellness today?  Receive Assistance Arranging In-Home Care Services.    Goals Addressed             This Visit's Progress    Receive Assistance Arranging In-Home Care Services.   On track    Care Coordination Interventions:  Assessed Social Determinant of Health Barriers. Discussed Plans for Ongoing Care Management Follow Up. Provided Tree surgeon Information for Care Management Team. Screened for Signs & Symptoms of Depression Related to Chronic Disease State.  WUJ8/JXB1 Depression Screen Completed & Results Reviewed.  Suicidal Ideation & Homicidal Ideation Assessed - None Present.      Active Listening & Reflection Utilized.  Verbalization of Feelings Encouraged. Emotional Support Provided. Caregiver Stress Acknowledged. Caregiver Resources Provided. Caregiver Support Groups Offered & Self-Enrollment Encouraged. Provided Crisis Support Information, Agencies & Resources. Problem Solving Interventions Identified. Task-Centered Solutions Implemented.   Solution-Focused Strategies Developed. Psychoeducation for Mental Health Concerns Initiated. Reviewed Prescription Medications & Discussed Importance of Compliance. Quality of Sleep Assessed & Sleep Hygiene Techniques Promoted. Discussed Higher Level of Care Options ([place]) with Patient's Daughter & Encouraged Consideration. CSW Collaboration with Patient's Daughter to [place] Covered Under Current Conservation officer, nature. Reviewed Designer, television/film set through [employer] (Primary) & [employer] (Secondary) with Patient's Daughter & Ensured Understanding through [employer]. Verified Patient/Spouse Combined Monthly Social Security Income Exceeds Poverty Guidelines for the [place], Making Patient Ineligible for [employer], Even Through [employer], Effective 11/06/2022. Verified No Long-Term Care Insurance Benefits.  Confirmed Patient, Nor Patient's Wife, Were Veterans, Making Patient Ineligible for Aid & Attendance Benefits, Through [employer]. Only pay for home health agencies in [place] stating need for assistance with adl's from Dr. Moshe Cipro  Dropped off letter to Chase [name] (son) [phone] Chase [name] (daughter) [phone] Helene Kelp (daughter) [phone] Provide Prince Ivery to Request Application from [facility] Needs RN for wound care & dressing changes Already Has [facility] Lift & Transfer Lift          SDOH assessments and interventions completed:  Yes.  SDOH Interventions Today    Flowsheet Row Most Recent Value  SDOH Interventions   Food Insecurity Interventions Intervention Not Indicated  [Verified by Daughter, [place] Interventions Intervention Not Indicated  [Verified by Daughter, Adela Lank [place]]  Transportation Interventions Intervention Not Indicated, Patient Resources (Friends/Family), Payor Benefit  Utilities Interventions Intervention Not Indicated  [Verified by Daughter, Production manager P[place]]  Alcohol Usage Interventions Intervention Not Indicated (Score <7)  [Verified by Daughter, Shelia Yano]  Financial Strain Interventions Intervention Not Indicated  [Verified by Daughter, Production manager [place]]  Physical Activity Interventions Intervention Not Indicated  [Verified by Daughter, Adela Lank [place]]  Stress Interventions Intervention Not Indicated   [Verified by Daughter, Adela Lank [place]]  Social Connections Interventions Intervention Not Indicated  [Verified by Daughter, [facility] [place]]     Care Coordination Interventions:  Yes, provided.   Follow up plan: Follow up call scheduled for 12/23/2022 at 3:00 pm.  Encounter Outcome:  Pt. Visit Completed.   Nat Christen, BSW, MSW, LCSW  Licensed Education officer, [facility]  Mailing [place] [address], [place], [place] [address] Physical Address-[address] [place], [place] [address] Toll Free Main # [phone] Fax # [phone] Cell # [phone] [email or web address]

## 2022-12-15 NOTE — Patient Instructions (Signed)
Visit Information  Thank you for taking time to visit with me today. Please don't hesitate to contact me if I can be of assistance to you.   Following are the goals we discussed today:   Goals Addressed             This Visit's Progress    Inez.   On track    Care Coordination Interventions:  Assessed Social Determinant of Health Barriers. Discussed Plans for Ongoing Care Management Follow Up. Provided Tree surgeon Information for Care Management Team. Screened for Signs & Symptoms of Depression Related to Chronic Disease State.  HER7/EYC1 Depression Screen Completed & Results Reviewed.  Suicidal Ideation & Homicidal Ideation Assessed - None Present.      Active Listening & Reflection Utilized.  Verbalization of Feelings Encouraged. Emotional Support Provided. Caregiver Stress Acknowledged. Caregiver Resources Provided. Caregiver Support Groups Offered & Self-Enrollment Encouraged. Provided Crisis Support Information, Agencies & Resources. Problem Solving Interventions Identified. Task-Centered Solutions Implemented.   Solution-Focused Strategies Developed. Psychoeducation for Mental Health Concerns Initiated. Reviewed Prescription Medications & Discussed Importance of Compliance. Quality of Sleep Assessed & Sleep Hygiene Techniques Promoted. Discussed Higher Level of Care Options ([place], [place]) with Patient's Daughters, [name] & [name] & Encouraged Consideration. CSW Collaboration with Patient's Daughters, Lawyer & Hughey Rittenberry to [place] Service Benefits Covered Under Current Insurance Provider. Reviewed Designer, television/film set through [employer] (Primary) & Blue Cross [employer] (Secondary) with Patient's Daughters, Baylon Santelli & [name] & Ensured Understanding through Teach Back. CSW Collaboration  with Patient's Daughters, Treyvonne Tata & [name], to Compile List of Questions to Ask Medicare & Blue Cross Longs Drug Stores, to [employer] Provided to [employer], Which Includes All of The Following: ~ Obtain Written Order of Medical Necessity From Primary Care Provider for [employer]. ~ Written Order of Medical Necessity From Primary Care Provider for [place] Must Include Need for Maximum Assistance with Standing, Transferring & All Activities of Daily Living. ~ Written Order of Medical Necessity From Primary Care Provider for [place] Must Include Need for Maximum Assistance with Standing, Transferring & All Activities of Daily Living & Submitted to Agencies of Interest. ~ Medicare & [place] Only Agreed to Pay for [employer] through An Agency In-Network in [place], [place]. ~ [employer] in [place], [place] Must Be Able to Accommodate Patient's Needs & Lift to Stand & Transfer. ~ Associate Professor in [place], [place] to [employer] or Deny Referral for [employer]. CSW Collaboration with Representative from [employer] [phone]), to Inquire About Coverage & Benefits. ~ HIPAA Compliant Message Left on Voicemail, as CSW Awaits a Return Call. CSW Collaboration with Representative from [employer] [phone]), to Inquire About Coverage & Benefits. ~ HIPAA Compliant Message Left on Voicemail, as CSW Awaits a Return Call. Emailed & Confirmed Receipt of The Following List of Agencies, [employer], to Pilgrim's Pride Daughters, [employer] & Danyell Awbrey on 12/15/2022: ~ [place] Providers ~ [place] ~ [place] Encouraged Patient's Daughters, [place] & Sal Spratley to Review List of Agencies, [place] with At [employer] 6 Different Agencies of Interest, Which  Include All of The Following: ~ [facility] [phone])   ~ [place]  [facility] [phone])   ~ [place] (# [phone]   ~ [facility] [phone])   ~ [place] [phone])   ~ [facility] [phone]) [place] with Primary Care Provider, Dr. Tula Nakayama to Request Written Order of Medical Necessity for [place], to Include Need for Maximum Assistance with Standing, Transferring & All Activities of Daily Living. Written Order of Medical Necessity for In-Home Care Services from Primary Care Provider, Dr. Tula Nakayama Faxed to The Following List of Agencies, [place], Per Patient's Daughters, [place] Lagares's Request: Verified Patient/Spouse Combined Monthly [place] Exceeds Poverty Guidelines for the [place] of [place] in [place] 2024, Making Patient Ineligible for [employer], Even Through [employer], Effective 11/06/2022. Verified No Long-Term Care Insurance Benefits.  Confirmed Patient, Nor Patient's Wife, Were Veterans, Making Patient Ineligible for Aid & Attendance Benefits, Through [employer]. CSW Collaboration with Primary Care Provider, Dr. Tula Nakayama to Request Order & Referral for [place] to Assist with [place]. Confirmed Patient Already Has All of The Following Durable Medical Equipment in The Home: ~ Hospital Bed ~ Bedside Commode/3-In-1 ~ Harrel Lemon Lift  ~ Surveyor, mining ~ Engineer, water ~ Teacher, adult education ~ Civil engineer, contracting with Back           Our next appointment is by telephone on 12/23/2022 at 3:00 pm.  Please call the care guide team at [phone] if you need to cancel or reschedule your appointment.   If you are experiencing a Mental Health or [place] or need someone to talk to, please call  the Suicide and Crisis Lifeline: 988 call the [place] National Suicide Prevention Lifeline: 831-312-3353 or TTY: 661-102-7768 TTY 8484122547) to talk to a trained counselor call 1-800-273-TALK (toll free, 24 hour hotline) go to [facility] [address], [place] [phone]) call the [place]: [phone] call 911  Patient verbalizes understanding of instructions and care plan provided today and agrees to view in [place]. Active MyChart status and patient understanding of how to access instructions and care plan via MyChart confirmed with patient.     Telephone follow up appointment with care management team member scheduled for:  12/23/2022 at 3:00 pm.    Nat Christen, BSW, MSW, [place]  Licensed Clinical Social Worker  [place]  Mailing [place]. [address], [place], [place] [address] Physical Address-[address], [place], [place] [address] Toll Free Main # [phone] Fax # [phone] Cell # [phone] [email or web address]

## 2022-12-16 NOTE — Patient Outreach (Signed)
Care Coordination   Initial Visit Note   12/16/2022  Name: Chase [name] MRN: [id number] DOB: 01/12/1931  Chase [name] is a 89 y.o. year old male who sees Moshe Cipro, [place] Levo, MD for primary care. I spoke with patient's daughters, Chase [name] & Chase [name] by phone today.  What matters to the patients health and wellness today?  Receive Assistance Arranging In-Home Care Services.    Goals Addressed             This Visit's Progress    Receive Assistance Arranging In-Home Care Services.   On track    Care Coordination Interventions:  Assessed Social Determinant of Health Barriers. Discussed Plans for Ongoing Care Management Follow Up. Provided Tree surgeon Information for Care Management Team. Screened for Signs & Symptoms of Depression Related to Chronic Disease State.  TSV7/BLT9 Depression Screen Completed & Results Reviewed.  Suicidal Ideation & Homicidal Ideation Assessed - None Present.      Active Listening & Reflection Utilized.  Verbalization of Feelings Encouraged. Emotional Support Provided. Caregiver Stress Acknowledged. Caregiver Resources Provided. Caregiver Support Groups Offered & Self-Enrollment Encouraged. Provided Crisis Support Information, Agencies & Resources. Problem Solving Interventions Identified. Task-Centered Solutions Implemented.   Solution-Focused Strategies Developed. Psychoeducation for Mental Health Concerns Initiated. Reviewed Prescription Medications & Discussed Importance of Compliance. Quality of Sleep Assessed & Sleep Hygiene Techniques Promoted. Discussed Higher Level of Care Options ([place], [place]) with Patient's Daughters, Chase [name] & Chase [name] & Encouraged Consideration. CSW Collaboration with Patient's Daughters, Lawyer & Shaft Corigliano to [place] Service Benefits Covered Under Current Insurance Provider. Reviewed Geologist, engineering through [employer] (Primary) & Blue Cross [employer] (Secondary) with Patient's Daughters, Mandel [name] & Chase [name] & Ensured Understanding through [employer] Back. CSW Collaboration with Patient's Daughters, Chase [name] & Chase [name], to Compile List of Questions to Ask Medicare & Blue Cross Longs Drug Stores, to [employer] Provided to [employer], Which Includes All of The Following: ~ Obtain Written Order of Medical Necessity From Primary Care Provider for [employer]. ~ Written Order of Medical Necessity From Primary Care Provider for [place] Must Include Need for Maximum Assistance with Standing, Transferring & All Activities of Daily Living. ~ Written Order of Medical Necessity From Primary Care Provider for [place] Must Include Need for Maximum Assistance with Standing, Transferring & All Activities of Daily Living & Submitted to Agencies of Interest. ~ Medicare & [place] Only Agreed to Pay for [employer] through An Agency In-Network in [place], [place]. ~ [employer] in [place], [place] Must Be Able to Accommodate Patient's Needs & Lift to Stand & Transfer. ~ Associate Professor in [place], [place] to [employer] or Deny Referral for [employer]. CSW Collaboration with Representative from [employer] [phone]), to Inquire About Coverage & Benefits. ~ HIPAA Compliant Message Left on Voicemail, as CSW Awaits a Return Call. CSW Collaboration with Representative from [employer] [phone]), to Inquire About Coverage & Benefits. ~ HIPAA Compliant Message Left on Voicemail, as CSW Awaits a Return Call. Emailed & Confirmed Receipt of The Following List of Agencies, [employer], to Pilgrim's Pride Daughters, [employer] & Antinio Sanderfer on 12/15/2022: ~ [place] Providers ~ [place] ~ [place] Encouraged Patient's Daughters, Chase [name] & Chase [name] to Review List of Agencies,  [place] with At [facility] 6 Different Agencies of Interest, Which Include All of The Following: ~ [facility] [phone])   ~ [place] [phone])   ~ [place] (# [phone]   ~ [place] (# [phone])   ~ [place] [phone])   ~ [place] [phone]) [place] with Primary Care Provider, Dr. Tula [name] to Request Written Order of Medical Necessity for [place], to Include Need for Maximum Assistance with Standing, Transferring & All Activities of Daily Living. Written Order of Medical Necessity for In-Home Care Services from Primary Care Provider, Dr. Tula [name] Faxed to The Following List of Agencies, [place], Per Patient's Daughters, [place] Roher's Request: Verified Patient/Spouse Combined Monthly [place] Exceeds Poverty Guidelines for the [place] of [place] in [place] 2024, Making Patient Ineligible for [employer], Even Through [employer], Effective 11/06/2022. Verified No Long-Term Care Insurance Benefits.  Confirmed Patient, Nor Patient's Wife, Were Veterans, Making Patient Ineligible for Aid & Attendance Benefits, Through [employer]. CSW Collaboration with Primary Care Provider, Dr. Tula [name] to Request Order & Referral for Chase [name] to Assist with [place]. Confirmed Patient Already Has All of The Following Durable Medical Equipment in The Home: ~ Hospital Bed ~ Bedside Commode/3-In-1 ~ Harrel Lemon Lift  ~ Surveyor, mining ~ Engineer, water ~ Teacher, adult education ~ Civil engineer, contracting with Back             SDOH assessments and interventions completed:  Yes.  SDOH Interventions Today     Flowsheet Row Most Recent Value  SDOH Interventions   Food Insecurity Interventions Intervention Not Indicated  [Verified by Daughter, [place] Interventions Intervention Not Indicated  [Verified by Daughter, Adela Lank Oplinger]  Transportation Interventions Intervention Not Indicated, Patient Resources (Friends/Family), Payor Benefit  Utilities Interventions Intervention Not Indicated  [Verified by Daughter, Production manager Penix]  Alcohol Usage Interventions Intervention Not Indicated (Score <7)  [Verified by Daughter, Shelia Zavadil]  Financial Strain Interventions Intervention Not Indicated  [Verified by Daughter, Production manager [place]]  Physical Activity Interventions Intervention Not Indicated  [Verified by Daughter, Adela Lank Kozakiewicz]  Stress Interventions Intervention Not Indicated  [Verified by Daughter, Adela Lank [place]]  Social Connections Interventions Intervention Not Indicated  [Verified by Daughter, [facility] Novitsky]     Care Coordination Interventions:  Yes, provided.   Follow up plan: Follow up call scheduled for 12/23/2022 at 3:00 pm.  Encounter Outcome:  Pt. Visit Completed.   Nat Christen, BSW, MSW, LCSW  Licensed Education officer, environmental Health System  Mailing [place] [address], [place], [place] [address] Physical Address-[address] [place],  [address] Toll Free Main # [phone] Fax # [phone] Cell # [phone] [email or web address]

## 2022-12-18 DIAGNOSIS — N184 Chronic kidney disease, stage 4 (severe): Secondary | ICD-10-CM | POA: Diagnosis not present

## 2022-12-18 DIAGNOSIS — D631 Anemia in chronic kidney disease: Secondary | ICD-10-CM | POA: Diagnosis not present

## 2022-12-21 ENCOUNTER — Ambulatory Visit: Payer: Medicare Other | Attending: Nurse Practitioner | Admitting: Nurse Practitioner

## 2022-12-21 ENCOUNTER — Encounter: Payer: Self-pay | Admitting: Nurse Practitioner

## 2022-12-21 ENCOUNTER — Telehealth: Payer: Self-pay | Admitting: Cardiology

## 2022-12-21 DIAGNOSIS — I779 Disorder of arteries and arterioles, unspecified: Secondary | ICD-10-CM | POA: Diagnosis not present

## 2022-12-21 DIAGNOSIS — F03B3 Unspecified dementia, moderate, with mood disturbance: Secondary | ICD-10-CM | POA: Diagnosis not present

## 2022-12-21 DIAGNOSIS — N184 Chronic kidney disease, stage 4 (severe): Secondary | ICD-10-CM

## 2022-12-21 DIAGNOSIS — E785 Hyperlipidemia, unspecified: Secondary | ICD-10-CM | POA: Diagnosis not present

## 2022-12-21 DIAGNOSIS — I453 Trifascicular block: Secondary | ICD-10-CM

## 2022-12-21 DIAGNOSIS — R9431 Abnormal electrocardiogram [ECG] [EKG]: Secondary | ICD-10-CM

## 2022-12-21 DIAGNOSIS — I10 Essential (primary) hypertension: Secondary | ICD-10-CM

## 2022-12-21 NOTE — Patient Instructions (Addendum)
Medication Instructions:  Continue all current medications.  Labwork: none  Testing/Procedures: none  Follow-Up: 6 months   Any Other Special Instructions Will Be Listed Below (If Applicable).  If you need a refill on your cardiac medications before your next appointment, please call your pharmacy.  

## 2022-12-21 NOTE — Telephone Encounter (Signed)
  Patient Consent for Virtual Visit        Chase [name] has provided verbal consent on 12/21/2022 for a virtual visit (video or telephone).   CONSENT FOR VIRTUAL VISIT FOR:  Chase [name]  By participating in this virtual visit I agree to the following:  I hereby voluntarily request, consent and authorize [facility] and its employed or contracted physicians, physician assistants, nurse practitioners or other licensed health care professionals (the Practitioner), to provide me with telemedicine health care services (the "Services") as deemed necessary by the treating Practitioner. I acknowledge and consent to receive the Services by the Practitioner via telemedicine. I understand that the telemedicine visit will involve communicating with the Practitioner through live audiovisual communication technology and the disclosure of certain medical information by electronic transmission. I acknowledge that I have been given the opportunity to request an in-person assessment or other available alternative prior to the telemedicine visit and am voluntarily participating in the telemedicine visit.  I understand that I have the right to withhold or withdraw my consent to the use of telemedicine in the course of my care at any time, without affecting my right to future care or treatment, and that the Practitioner or I may terminate the telemedicine visit at any time. I understand that I have the right to inspect all information obtained and/or recorded in the course of the telemedicine visit and may receive copies of available information for a reasonable fee.  I understand that some of the potential risks of receiving the Services via telemedicine include:  Delay or interruption in medical evaluation [name] to technological equipment failure or disruption; Information transmitted may not be sufficient (e.g. poor resolution of images) to allow for appropriate medical decision making by the Practitioner;  and/or  In rare instances, security protocols could fail, causing a breach of personal health information.  Furthermore, I acknowledge that it is my responsibility to provide information about my medical history, conditions and care that is complete and accurate to the best of my ability. I acknowledge that Practitioner's advice, recommendations, and/or decision may be based on factors not within their control, such as incomplete or inaccurate data provided by me or distortions of diagnostic images or specimens that may result from electronic transmissions. I understand that the practice of medicine is not an exact science and that Practitioner makes no warranties or guarantees regarding treatment outcomes. I acknowledge that a copy of this consent can be made available to me via my patient portal ([facility]), or I can request a printed copy by calling the office of [facility].    I understand that my insurance will be billed for this visit.   I have read or had this consent read to me. I understand the contents of this consent, which adequately explains the benefits and risks of the Services being provided via telemedicine.  I have been provided ample opportunity to ask questions regarding this consent and the Services and have had my questions answered to my satisfaction. I give my informed consent for the services to be provided through the use of telemedicine in my medical care

## 2022-12-21 NOTE — Progress Notes (Unsigned)
Virtual Visit via Telephone Note   Because of Chase [name]'s co-morbid illnesses, he is at least at moderate risk for complications without adequate follow up.  This format is felt to be most appropriate for this patient at this time.  The patient did not have access to video technology/had technical difficulties with video requiring transitioning to audio format only (telephone).  All issues noted in this document were discussed and addressed.  No physical exam could be performed with this format.  Please refer to the patient's chart for his consent to telehealth for [facility].    Date:  12/21/2022  ID:  Chase [name], DOB 1931/10/24, MRN [id number] The patient was identified using 2 identifiers.  Patient Location: Home Provider Location: Office/Clinic   PCP:  [name], [place] Providers Cardiologist:  Carlyle Dolly, MD     Evaluation Performed:  Follow-Up Visit  Chief Complaint:  Phlegm   History of Present Illness:    Chase [name] is a 89 y.o. male with the following:  Abnormal EKG (chronic trifascicular block) Hypertension Hyperlipidemia Type 2 diabetes Carotid artery stenosis History of CVA History of COVID-19 infection CKD  Venous stasis ulcers Hx of dementia   Patient is a 89 year old male with past medical history as mentioned above.  Followed by vascular surgery for history of venous stasis ulcers and carotid stenosis.  In 2022 he contracted COVID-19 and was treated with molnupirarvir.   Last seen by Dr. Carlyle Dolly on August 22, 2021.  Was doing well at the time.  Denied any lightheadedness, edema, or dizziness.  Was compliant with his medications.  No medication changes were made.  Was told to follow-up in 1 year.  Patient is a poor historian d/t hx of dementia, therefore wife is the historian. Wife states he is doing well, however, he has been coughing up some phlegm. He is doing well cardiac wise. States  she has not witnessed that he is having chest pain or shortness of breath. Says it is harder for him to come to office visits with his history of dementia. Denies any chest pain, shortness of breath, palpitations, syncope, presyncope, dizziness, orthopnea, PND, swelling or significant weight changes, acute bleeding, or claudication.   Past Medical History:  Diagnosis Date   Bradycardia 03/15/2012   Carotid artery occlusion    Choledocholithiasis 02/25/2018   CKD (chronic kidney disease) stage 4, GFR 15-29 ml/min (HCC)    Complete lesion of cervical spinal cord ([place]) 03/14/3012   Stable since 2006   CVA (cerebrovascular accident) ([place]) 09/07/12   right sided weakness   Depressive disorder, not elsewhere classified    Diabetes mellitus approx 1994   Diabetic neuropathy ([place])    History of kidney stones    Hypertensive heart disease    Hypothyroidism approx 2000   Lacunar stroke, acute ([place]) 03/14/2012   Obesity    Osteoarthrosis, unspecified whether generalized or localized, lower leg    Other and unspecified hyperlipidemia    Peripheral vascular disease, unspecified ([place])    Seizures ([place])    unknown etiology; on meds, last seizure was 2015   Spinal stenosis, unspecified region other than cervical    Spondylosis of unspecified site without mention of myelopathy    Past Surgical History:  Procedure Laterality Date   BILIARY STENT PLACEMENT N/A 05/27/2018   Procedure: BILIARY STENT PLACEMENT;  Surgeon: Rogene Houston, MD;  [facility];  Service: Endoscopy;  Laterality: N/A;  COLONOSCOPY N/A 08/10/2013   Procedure: COLONOSCOPY;  Surgeon: Rogene Houston, MD;  [facility];  Service: Endoscopy;  Laterality: N/A;  240   ERCP N/A 03/02/2018   Procedure: ENDOSCOPIC RETROGRADE CHOLANGIOPANCREATOGRAPHY (ERCP) With sphincterotomy and stent placement;  Surgeon: Rogene Houston, MD;  [facility];  Service: Gastroenterology;  Laterality: N/A;   ERCP N/A 05/27/2018    Procedure: ENDOSCOPIC RETROGRADE CHOLANGIOPANCREATOGRAPHY (ERCP);  Surgeon: Rogene Houston, MD;  [facility];  Service: Endoscopy;  Laterality: N/A;   ERCP N/A 09/16/2018   Procedure: ENDOSCOPIC RETROGRADE CHOLANGIOPANCREATOGRAPHY (ERCP);  Surgeon: Rogene Houston, MD;  [facility];  Service: Endoscopy;  Laterality: N/A;   GASTROINTESTINAL STENT REMOVAL N/A 05/27/2018   Procedure: Biliary STENT REMOVAL;  Surgeon: Rogene Houston, MD;  [facility];  Service: Endoscopy;  Laterality: N/A;   GASTROINTESTINAL STENT REMOVAL N/A 09/16/2018   Procedure: GASTROINTESTINAL STENT REMOVAL;  Surgeon: Rogene Houston, MD;  [facility];  Service: Endoscopy;  Laterality: N/A;   kidney stones left x2  1975   KIDNEY SURGERY     Ruptured left kidney 30 yrs ago  from a kidney stone   LITHOTRIPSY N/A 05/27/2018   Procedure: MECHANICAL LITHOTRIPSY;  Surgeon: Rogene Houston, MD;  [facility];  Service: Endoscopy;  Laterality: N/A;   REMOVAL OF STONES N/A 09/16/2018   Procedure: REMOVAL OF MULTIPLE STONES WITH BASKET AND BALLOON;  Surgeon: Rogene Houston, MD;  [facility];  Service: Endoscopy;  Laterality: N/A;   SPHINCTEROTOMY N/A 05/27/2018   Procedure: SPHINCTEROTOMY extended;  Surgeon: Rogene Houston, MD;  [facility];  Service: Endoscopy;  Laterality: N/A;   SPYGLASS CHOLANGIOSCOPY N/A 05/27/2018   Procedure: SPYGLASS CHOLANGIOSCOPY;  Surgeon: Rogene Houston, MD;  [facility];  Service: Endoscopy;  Laterality: N/A;     Current Meds  Medication Sig   amLODipine (NORVASC) 10 MG tablet TAKE 1 TABLET DAILY   calcium carbonate (TUMS - DOSED IN MG ELEMENTAL CALCIUM) 500 MG chewable tablet Chew 0.5 tablets by mouth 2 (two) times daily.   clopidogrel (PLAVIX) 75 MG tablet TAKE 1 TABLET DAILY   diphenoxylate-atropine (LOMOTIL) 2.5-0.025 MG tablet Take one tablet once daily as needed, for diarrheah   donepezil  (ARICEPT) 5 MG tablet Take 5 mg by mouth daily.   famotidine (PEPCID) 20 MG tablet Take 20 mg by mouth 2 (two) times daily.   hydrALAZINE (APRESOLINE) 25 MG tablet Take 25 mg by mouth in the morning and at bedtime.   hydrOXYzine (ATARAX) 10 MG tablet TAKE 1 TABLET BY MOUTH ONCE DAILY AS NEEDED FOR  AGITATION   levETIRAcetam (KEPPRA) 100 MG/ML solution Take 500 mg by mouth daily.   levothyroxine (SYNTHROID) 112 MCG tablet TAKE 1 TABLET DAILY BEFORE BREAKFAST   Multiple Vitamins-Minerals (MULTIVITAMIN WITH MINERALS) tablet Take 1 tablet by mouth daily.   Nystatin (GERHARDT'S BUTT CREAM) CREA Apply sparingly twice daily to affected area   olopatadine (PATANOL) 0.1 % ophthalmic solution INSTILL 1 DROP INTO EACH EYE TWICE DAILY   ondansetron (ZOFRAN-ODT) 8 MG disintegrating tablet Take 8 mg by mouth every 8 (eight) hours as needed.   polyethylene glycol powder (GLYCOLAX/MIRALAX) 17 GM/SCOOP powder Take 17 g by mouth daily. (Patient taking differently: Take 17 g by mouth daily as needed.)   pravastatin (PRAVACHOL) 20 MG tablet TAKE 1 TABLET DAILY   sodium bicarbonate 650 MG tablet Take 650 mg by mouth 3 (  three) times daily.   tamsulosin (FLOMAX) 0.4 MG CAPS capsule Take 1 capsule (0.4 mg total) by mouth daily.   UNABLE TO FIND Med Name: Patient needs assistance with transferring and ADLS and needs an in home care nurse. Patient is unable to stand on his own or help stand.     Allergies:   Bayer advanced aspirin [aspirin] and Penicillins   Social History   Tobacco Use   Smoking status: Never    Passive exposure: Never   Smokeless tobacco: Never   Tobacco comments:    Verified by Daughter, [name].  Vaping Use   Vaping Use: Never used  Substance Use Topics   Alcohol use: No    Alcohol/week: 0.0 standard drinks of alcohol   Drug use: No     Family Hx: The patient's family history includes Diabetes in his brother, brother, daughter, daughter, and mother; Hypertension in his brother and  brother; Prostate cancer in his father; Stroke in his brother.  ROS:   Review of Systems  Constitutional: Negative.   HENT: Negative.    Eyes: Negative.   Respiratory:  Positive for sputum production. Negative for cough, hemoptysis, shortness of breath and wheezing.   Cardiovascular: Negative.   Gastrointestinal: Negative.   Genitourinary: Negative.   Musculoskeletal: Negative.   Skin:        Some wounds recently noticed by wife.   Neurological: Negative.   Endo/Heme/Allergies: Negative.   Psychiatric/Behavioral: Negative.      Please see the history of present illness.    All other systems reviewed and are negative.   Prior CV studies:   The following studies were reviewed today:  Carotid duplex on 11/12/2020: Summary:  Right Carotid: Velocities in the right ICA are consistent with a 1-39%  stenosis.   Left Carotid: Velocities in the left ICA are consistent with a 40-59%  stenosis.     Echocardiogram on 12/01/2014:  - Left ventricle: The cavity size was normal. Wall thickness was    increased in a pattern of moderate LVH. Systolic function was    normal. The estimated ejection fraction was in the range of 55%    to 60%. Wall motion was normal; there were no regional wall    motion abnormalities. Doppler parameters are consistent with    abnormal left ventricular relaxation (grade 1 diastolic    dysfunction).  - Aortic valve: Valve area (VTI): 2.69 cm^2. Valve area (Vmax):    2.73 cm^2. Valve area (Vmean): 2.43 cm^2.  - Mitral valve: There was mild regurgitation.  - Pulmonary arteries: Systolic pressure was mildly increased. PA    peak pressure: 41 mm Hg (S).   Labs/Other Tests and Data Reviewed:    EKG:  An ECG dated 08/22/2021 was personally reviewed today and demonstrated: SR, first degree AV block, RBBB, LVH, with repolarization abnormality.   Recent Labs: 04/06/2022: TSH 1.430   Recent Lipid Panel Lab Results  Component Value Date/Time   CHOL 169  03/27/2021 09:58 AM   TRIG 32 03/27/2021 09:58 AM   HDL 81 03/27/2021 09:58 AM   CHOLHDL 2.1 03/27/2021 09:58 AM   CHOLHDL 2.1 10/01/2020 11:36 AM   LDLCALC 80 03/27/2021 09:58 AM   LDLCALC 75 10/01/2020 11:36 AM    Wt Readings from Last 3 Encounters:  05/06/22 166 lb (75.3 kg)  10/02/21 166 lb 6.4 oz (75.5 kg)  09/25/21 166 lb 6.4 oz (75.5 kg)       Objective:    Vital Signs:  There were  no vitals taken for this visit.   Unable to conduct due to nature of visit today and d/t patient's history of dementia.   ASSESSMENT & PLAN:    History of Abnormal EKG/trifascicular block Patient's wife denies any concerning or significant symptoms.  Due to nature of visit, unable to view EKG.  Continue to monitor.  HLD Lipid profile in 2022 revealed total cholesterol 169, HDL 81, LDL 80, and triglycerides 32.  He is due for labs to be rechecked, however he is unable to make office visits due to history of dementia.  Will continue current medication management.  Will route this note to PCP in regards to home-based primary care.  See below.  Heart healthy diet recommended.  Carotid artery stenosis Patient's wife denies any concerning symptoms.  Last carotid duplex performed in 2021 revealed right ICA at 1 to 39% stenosis, left ICA 40 to 59% stenosis.  Would like to have this study repeated at this time, patient's wife states that is difficult to get him to appointments.  Followed by vascular.  Continue current medication regimen.  Heart healthy diet recommended.  Will continue to monitor.  HTN Do not have recent BP on file.  BP checked by home health care, and patient's wife denies any issues.  Continue current medication regimen.  Heart healthy diet recommended. Discussed to monitor BP at home at least 2 hours after medications and sitting for 5-10 minutes.  5. CKD stage IV Wife reports this is stable. Followed by Dr. Theador Hawthorne.   Dementia  Difficult for patient to get to office appointments.  I  had a in-depth conversation with patient's wife and highly recommended/suggested home-based primary care.  As stated above, will route this note to patient's PCP, Dr. Tula Nakayama, for any suggestions or recommendations.  I also recommend Dr. Moshe Cipro to have telephone in-depth discussion with patient's wife regarding goals of care.    Time:   Today, I have spent 11 minutes with the patient with telehealth technology discussing the above problems.     Medication Adjustments/Labs and Tests Ordered: Current medicines are reviewed at length with the patient today.  Concerns regarding medicines are outlined above.   Tests Ordered: No orders of the defined types were placed in this encounter.   Medication Changes: No orders of the defined types were placed in this encounter.   Follow Up:  Virtual Visit  in 6 month(s) with Dr. Carlyle Dolly, as wife states difficulty for in-person office visits.   Signed, Finis Bud, NP  12/22/2022 8:55 AM    [place]

## 2022-12-23 ENCOUNTER — Other Ambulatory Visit: Payer: Self-pay | Admitting: Family Medicine

## 2022-12-23 ENCOUNTER — Encounter: Payer: Self-pay | Admitting: *Deleted

## 2022-12-23 ENCOUNTER — Ambulatory Visit: Payer: Self-pay | Admitting: *Deleted

## 2022-12-23 DIAGNOSIS — R262 Difficulty in walking, not elsewhere classified: Secondary | ICD-10-CM

## 2022-12-23 DIAGNOSIS — L98429 Non-pressure chronic ulcer of back with unspecified severity: Secondary | ICD-10-CM

## 2022-12-24 NOTE — Patient Outreach (Signed)
Care Coordination   Follow Up Visit Note   12/24/2022  Name: Chase [name] MRN: [id number] DOB: 23-Apr-1931  Chase [name] is a 89 y.o. year old male who sees Chase [name], Chase Levo, MD for primary care. I spoke with patient's daughters, Chase [name] and Chase [name] by phone today.  What matters to the patients health and wellness today?   Receive Assistance Arranging In-Home Care Services.   Goals Addressed             This Visit's Progress    Receive Assistance Arranging In-Home Care Services.   On track    Care Coordination Interventions:    Active Listening & Reflection Utilized.  Verbalization of Feelings Encouraged. Emotional & Caregiver Support Provided. Caregiver Stress Acknowledged. Caregiver Resources Revisited. Self-Enrollment in Caregiver Support Group Emphasized, From List Provided. Problem Solving Interventions Enacted. Task-Centered Solutions Employed.   Solution-Focused Strategies Implemented. CSW Collaboration with Representative from [employer] [phone]), But Unable to Obtain Provider Benefits Without Consent to Release Information on File. CSW Collaboration with Patient's Daughters, Chase [name] & Chase [name], to [employer] with [employer] [phone]), to Address All of The Following Questions & Concerns: ~ Obtain Written Order of Medical Necessity From Primary Care Provider for [place]. ~ Written Order of Medical Necessity From Primary Care Provider for [place] Must Include Need for Maximum Assistance with Standing, Transferring & All Activities of Daily Living. ~ Written Order of Medical Necessity From Primary Care Provider for [place] Must Include Need for Maximum Assistance with Standing, Transferring & All Activities of Daily Living & Submitted to Agencies of Interest. ~ Medicare & [place] Only Agreed to Pay for [employer] through An Agency In-Network in [place], [place]. ~ [employer] in [place], [place] Must Be Able to Accommodate Patient's Needs & Lift to Stand & Transfer. ~ Associate Professor in [place], [place] to [employer] or Deny Referral for [employer]. Patient's Daughters, Chase [name] & Chase [name] Reviewed List of [place] on [facility] [phone]), as Their Agency of Choice to [place]. Written Order of Medical Necessity for In-Home Care Services from Primary Care Provider, Dr. Tula [name], Faxed to [facility] [phone]), For Consideration. CSW Environmental education officer from [facility] [phone]), to Confirm Receipt of Written Order of Medical Necessity for [facility]. ~ Written Order of Medical Necessity for In-Home Care Services Tentatively Accepted by [facility] [phone]), Pending Home Assessment, Scheduled on 12/25/2022. CSW Collaboration with Patient's Daughters, Chase [name] & Chase [name], to Encourage Their Presence During Home Assessment for Patient with [facility] [phone]). CSW Collaboration with Primary Care Provider, Dr. Tula [name] Via Secure Chat Message in [place], to Request Order & Referral for [place] to Assist with [place].          ~ CSW is Currently Awaiting a Response. Please Keep Follow-Up Appointment with Primary Care Provider, Dr. Tula [name], Scheduled on 12/25/2022 at 2:20 PM. [place] with Patient's Daughters, Chase [name] & Chase [name], to [employer] for Patient's Follow-Up Appointment with Primary Care Provider, Dr. Tula [name], Scheduled on 12/25/2022 at 2:20 PM.             SDOH assessments and interventions completed:  Yes.  Care  Coordination  Interventions:  Yes, provided.   Follow up plan: Follow up call scheduled for 01/13/2023 at 12:45 pm.  Encounter Outcome:  Pt. Visit Completed.   Chase [name], BSW, MSW, LCSW  Licensed Education officer, environmental Health System  Mailing [place] [address] [place], [place] [address] Physical Address-[address] [place], [place] [address] Toll Free Main # [phone] Fax # [phone] Cell # [phone] [name]

## 2022-12-24 NOTE — Patient Instructions (Signed)
Visit Information  Thank you for taking time to visit with me today. Please don't hesitate to contact me if I can be of assistance to you.   Following are the goals we discussed today:   Goals Addressed             This Visit's Progress    [place].   On track    Care Coordination Interventions:    Active Listening & Reflection Utilized.  Verbalization of Feelings Encouraged. Emotional & Caregiver Support Provided. Caregiver Stress Acknowledged. Caregiver Resources Revisited. Self-Enrollment in Caregiver Support Group Emphasized, From List Provided. Problem Solving Interventions Enacted. Task-Centered Solutions Employed.   Solution-Focused Strategies Implemented. CSW Collaboration with Representative from [employer] [phone]), But Unable to Obtain Provider Benefits Without Consent to Release Information on File. CSW Collaboration with Patient's Daughters, Vannak Monten[place] & [name], to [employer] with [employer] [phone]), to Address All of The Following Questions & Concerns: ~ Obtain Written Order of Medical Necessity From Primary Care Provider for [place]. ~ Written Order of Medical Necessity From Primary Care Provider for [place] Must Include Need for Maximum Assistance with Standing, Transferring & All Activities of Daily Living. ~ Written Order of Medical Necessity From Primary Care Provider for [place] Must Include Need for Maximum Assistance with Standing, Transferring & All Activities of Daily Living & Submitted to Agencies of Interest. ~ Medicare & [place] Only Agreed to Pay for [employer] through An Agency In-Network in [place], [place]. ~ [employer] in [place], [place] Must Be Able to Accommodate Patient's Needs & Lift to Stand & Transfer. ~ Associate Professor in  [place], [place] to [employer] or Deny Referral for [employer]. Patient's Daughters, [name] & [name] Reviewed List of [place] on [facility] [phone]), as Their Agency of Choice to [place]. Written Order of Medical Necessity for In-Home Care Services from Primary Care Provider, Dr. Tula Nakayama, Faxed to [facility] [phone]), For Consideration. CSW Environmental education officer from [facility] [phone]), to Confirm Receipt of Written Order of Medical Necessity for [facility]. ~ Written Order of Medical Necessity for In-Home Care Services Tentatively Accepted by [facility] [phone]), Pending Home Assessment, Scheduled on 12/25/2022. CSW Collaboration with Patient's Daughters, Iktan Aikman & Herberth Deharo, to Encourage Their Presence During Home Assessment for Patient with [facility] [phone]). CSW Collaboration with Primary Care Provider, Dr. Tula Nakayama Via Secure Chat Message in [place], to Request Order & Referral for [place] to Assist with [place].  ~ CSW is Currently Awaiting a Response. Please Keep Follow-Up Appointment with Primary Care Provider, Dr. Tula Nakayama, Scheduled on 12/25/2022 at 2:20 PM. [place] with Patient's Daughters, [name] & [name], to [employer] for Patient's Follow-Up Appointment with Primary Care Provider, Dr. Tula Nakayama, Scheduled on 12/25/2022 at 2:20 PM.             Our next appointment is by telephone on 01/13/2023 at 12:45 pm.  Please call the care guide team at [phone] if you need to cancel or reschedule your appointment.   If you are experiencing a Mental Health or [place] or need someone to talk to, please call the Suicide  and Crisis  Lifeline: 988 call the [place] National Suicide Prevention Lifeline: (508) 796-5008 or TTY: (651) 057-0263 TTY 201-835-9203) to talk to a trained counselor call 1-800-273-TALK (toll free, 24 hour hotline) go to [facility] [address] [place] [phone]) call the [place]: [phone] call 911  Patient verbalizes understanding of instructions and care plan provided today and agrees to view in [place]. Active MyChart status and patient understanding of how to access instructions and care plan via MyChart confirmed with patient.     Telephone follow up appointment with care management team member scheduled for:  01/13/2023 at 12:45 pm.   Nat Christen, BSW, MSW, [place]  Licensed Clinical Social Worker  [place]  Mailing [place]. [address], [place], [place] [address] Physical Address-[address] [place], [place] [address] Toll Free Main # [phone] Fax # [phone] Cell # [phone] [email or web address]

## 2022-12-25 ENCOUNTER — Encounter: Payer: Self-pay | Admitting: Family Medicine

## 2022-12-25 ENCOUNTER — Ambulatory Visit (INDEPENDENT_AMBULATORY_CARE_PROVIDER_SITE_OTHER): Payer: Medicare Other | Admitting: Family Medicine

## 2022-12-25 VITALS — BP 138/63 | HR 57 | Ht 68.0 in

## 2022-12-25 DIAGNOSIS — G40209 Localization-related (focal) (partial) symptomatic epilepsy and epileptic syndromes with complex partial seizures, not intractable, without status epilepticus: Secondary | ICD-10-CM

## 2022-12-25 DIAGNOSIS — L89611 Pressure ulcer of right heel, stage 1: Secondary | ICD-10-CM | POA: Diagnosis not present

## 2022-12-25 DIAGNOSIS — R053 Chronic cough: Secondary | ICD-10-CM

## 2022-12-25 DIAGNOSIS — F02B11 Dementia in other diseases classified elsewhere, moderate, with agitation: Secondary | ICD-10-CM | POA: Diagnosis not present

## 2022-12-25 DIAGNOSIS — E038 Other specified hypothyroidism: Secondary | ICD-10-CM | POA: Diagnosis not present

## 2022-12-25 DIAGNOSIS — E782 Mixed hyperlipidemia: Secondary | ICD-10-CM | POA: Diagnosis not present

## 2022-12-25 DIAGNOSIS — E1121 Type 2 diabetes mellitus with diabetic nephropathy: Secondary | ICD-10-CM

## 2022-12-25 DIAGNOSIS — L89309 Pressure ulcer of unspecified buttock, unspecified stage: Secondary | ICD-10-CM | POA: Diagnosis not present

## 2022-12-25 DIAGNOSIS — L89311 Pressure ulcer of right buttock, stage 1: Secondary | ICD-10-CM | POA: Diagnosis not present

## 2022-12-25 DIAGNOSIS — F03B3 Unspecified dementia, moderate, with mood disturbance: Secondary | ICD-10-CM | POA: Diagnosis not present

## 2022-12-25 DIAGNOSIS — I509 Heart failure, unspecified: Secondary | ICD-10-CM | POA: Diagnosis not present

## 2022-12-25 DIAGNOSIS — Z7902 Long term (current) use of antithrombotics/antiplatelets: Secondary | ICD-10-CM | POA: Diagnosis not present

## 2022-12-25 DIAGNOSIS — I150 Renovascular hypertension: Secondary | ICD-10-CM | POA: Diagnosis not present

## 2022-12-25 DIAGNOSIS — R2689 Other abnormalities of gait and mobility: Secondary | ICD-10-CM | POA: Diagnosis not present

## 2022-12-25 DIAGNOSIS — R4689 Other symptoms and signs involving appearance and behavior: Secondary | ICD-10-CM | POA: Diagnosis not present

## 2022-12-25 DIAGNOSIS — Z794 Long term (current) use of insulin: Secondary | ICD-10-CM | POA: Diagnosis not present

## 2022-12-25 DIAGNOSIS — Z7401 Bed confinement status: Secondary | ICD-10-CM | POA: Diagnosis not present

## 2022-12-25 DIAGNOSIS — L89152 Pressure ulcer of sacral region, stage 2: Secondary | ICD-10-CM | POA: Diagnosis not present

## 2022-12-25 MED ORDER — MONTELUKAST SODIUM 10 MG PO TABS: 10.0000 mg | ORAL_TABLET | Freq: Every day | ORAL | 3 refills | Status: DC

## 2022-12-25 MED ORDER — BENZONATATE 100 MG PO CAPS: ORAL_CAPSULE | ORAL | 2 refills | Status: DC

## 2022-12-25 NOTE — Progress Notes (Signed)
Chase [name]     MRN: [id number]      DOB: Nov 04, 1931   HPI Chase [name] is here for follow up and re-evaluation of chronic medical conditions, medication management  He is accompanied by 2 daughters and his wife Pt states he feels good because he controls the world and everything in the worl dis working in his favor how he wants it to. Family members are concerned about ulcers on right foot and lower back, best supplement to use for ulcers, prostat, and best topiclal barrier to apply Also concerned about intermittent agitation requests med to calm him Requesting help to lift pt from chair to bed ,  morning and evening, just signed up with new Home health service  C/o excess secretion chronic ongoing cough and congestion, no recent fever or chills, clear nasal drainage  ROS, history from family Denies recent fever or chills. . Denies PND, orthopnea,, palpitations and leg swelling Denies abdominal pain, nausea, vomiting,diarrhea or constipation.   C/o incontinence. Chronic   limitation in mobility. Denies  seizures, chronic numbness,  Intermittent agitation   PE  BP 138/63 (BP Location: Left Arm, Patient Position: Sitting, Cuff Size: Normal)   Pulse (!) 57   Ht '5\' 8"'$  (1.727 m)   SpO2 98%   BMI 25.24 kg/m   Patient alert  and in no cardiopulmonary distress.  HEENT: No facial asymmetry, EOMI,     Neck decreased ROM .  Chest: adequate air entry, bibasilar crackles.  CVS: S1, S2 no murmurs, no S3.Regular rate.  ABD: Soft non tender.   Ext: No edema  MS: markedly decreased  ROM spine, shoulders, hips and knees.  Skin: unable to see ulcers, at visit will depend on photos from home health nurse Psych: Good eye contact, living in alternate reality CNS: CN 2-12 intact, power,  normal throughout.no focal deficits noted.   Assessment & Plan  Aggressive behavior Hydroxyzine 10 mg up to one daily as needed  Decubitus ulcer Managed by wound nurse in home, if worsens will  refer to wound clinic in [place], not visualized at visit due to location, will rely on photos and reports from H/H, present on foot and buttock  Mixed hyperlipidemia Hyperlipidemia:Low fat diet discussed and encouraged.   Lipid Panel  Lab Results  Component Value Date   CHOL 169 03/27/2021   HDL 81 03/27/2021   LDLCALC 80 03/27/2021   TRIG 32 03/27/2021   CHOLHDL 2.1 03/27/2021     Updated lab needed at/ before next visit.   Seizure disorder, complex partial ([place]) Controlled on medication  Poor balance Incapable of independent amblation , has to be lifted from chair to bed  Type 2 diabetes mellitus with diabetic nephropathy [facility]) Chase [name] is reminded of the importance of commitment to daily physical activity for 30 minutes or more, as able and the need to limit carbohydrate intake to 30 to 60 grams per meal to help with blood sugar control.   The need to take medication as prescribed, test blood sugar as directed, and to call between visits if there is a concern that blood sugar is uncontrolled is also discussed.   Chase [name] is reminded of the importance of daily foot exam, annual eye examination, and good blood sugar, blood pressure and cholesterol control.     Latest Ref Rng & Units 09/07/2022    1:47 PM 04/08/2022    2:15 PM 10/06/2021    1:21 PM 05/13/2021    1:30 PM 04/03/2021  1:25 PM  Diabetic Labs  HbA1c 4.0 - 5.6 % 5.7  5.8  5.7   5.9   Creatinine 0.61 - 1.24 mg/dL    3.25        12/25/2022    2:31 PM 09/22/2022    2:43 PM 09/07/2022    1:27 PM 08/06/2022    2:12 PM 05/06/2022    2:47 PM 04/08/2022    1:48 PM 02/04/2022    3:57 PM  BP/Weight  Systolic BP 564 332 951 884 166 063 016  Diastolic BP 63 77 68 70 70 55 70  Wt. (Lbs)     166    BMI     25.24 kg/m2        12/20/2018    1:40 PM 02/25/2017    2:40 PM  Foot/eye exam completion dates  Foot Form Completion Done Done      Controlled, no change in medication Managed by Endo  Renovascular  hypertension Controlled, no change in medication   Dementia ([place]) Progressing extremely delusional thinking and behavior reportedly becoming more aggressive/ uncontrolled when he cannot get to do what he wants like sit outside in the cold  Hypothyroidism Controlled, no change in medication Managed by Endo  Cough Daily montelukast and tessalon perle, portable CXR from h/ h to be ordered

## 2022-12-25 NOTE — Patient Instructions (Addendum)
F/U in 5 months, call if you need me sooner  New for cough and congestion is daily singulair and tessAlon perle  Nurse pls order portable cXR diagnosis is chronic cough. I will follow wounds/ ulcers with Nurse, if they become a bigger problem then there is a wound clinic in [place]  We are here for you when needed  He already has eye drops for watery eyes  Thanks for choosing [facility], we consider it a privelige to serve you.   For agitation is hjydroxyzine 10 mg half to one tablet once daily as needed

## 2022-12-28 ENCOUNTER — Encounter: Payer: Medicare Other | Admitting: *Deleted

## 2022-12-28 DIAGNOSIS — E1121 Type 2 diabetes mellitus with diabetic nephropathy: Secondary | ICD-10-CM | POA: Diagnosis not present

## 2022-12-28 DIAGNOSIS — F01B11 Vascular dementia, moderate, with agitation: Secondary | ICD-10-CM | POA: Diagnosis not present

## 2022-12-28 DIAGNOSIS — L89322 Pressure ulcer of left buttock, stage 2: Secondary | ICD-10-CM | POA: Diagnosis not present

## 2022-12-28 DIAGNOSIS — I150 Renovascular hypertension: Secondary | ICD-10-CM | POA: Diagnosis not present

## 2022-12-28 DIAGNOSIS — Z515 Encounter for palliative care: Secondary | ICD-10-CM | POA: Diagnosis not present

## 2022-12-28 DIAGNOSIS — G40209 Localization-related (focal) (partial) symptomatic epilepsy and epileptic syndromes with complex partial seizures, not intractable, without status epilepticus: Secondary | ICD-10-CM | POA: Diagnosis not present

## 2022-12-28 DIAGNOSIS — I509 Heart failure, unspecified: Secondary | ICD-10-CM | POA: Diagnosis not present

## 2022-12-28 DIAGNOSIS — L89611 Pressure ulcer of right heel, stage 1: Secondary | ICD-10-CM | POA: Diagnosis not present

## 2022-12-28 DIAGNOSIS — L89152 Pressure ulcer of sacral region, stage 2: Secondary | ICD-10-CM | POA: Diagnosis not present

## 2022-12-28 DIAGNOSIS — R059 Cough, unspecified: Secondary | ICD-10-CM | POA: Insufficient documentation

## 2022-12-28 MED ORDER — HYDROXYZINE HCL 10 MG PO TABS: ORAL_TABLET | ORAL | 5 refills | Status: DC

## 2022-12-28 NOTE — Assessment & Plan Note (Signed)
Managed by wound nurse in home, if worsens will refer to wound clinic in [place], not visualized at visit due to location, will rely on photos and reports from H/H, present on foot and buttock

## 2022-12-28 NOTE — Assessment & Plan Note (Signed)
Hydroxyzine 10 mg up to one daily as needed

## 2022-12-28 NOTE — Assessment & Plan Note (Addendum)
Hyperlipidemia:Low fat diet discussed and encouraged.   Lipid Panel  Lab Results  Component Value Date   CHOL 169 03/27/2021   HDL 81 03/27/2021   LDLCALC 80 03/27/2021   TRIG 32 03/27/2021   CHOLHDL 2.1 03/27/2021     Updated lab needed at/ before next visit.

## 2022-12-28 NOTE — Assessment & Plan Note (Signed)
Incapable of independent amblation , has to be lifted from chair to bed

## 2022-12-28 NOTE — Assessment & Plan Note (Signed)
Controlled, no change in medication  

## 2022-12-28 NOTE — Assessment & Plan Note (Signed)
Controlled, no change in medication  Managed by Endo 

## 2022-12-28 NOTE — Assessment & Plan Note (Signed)
Controlled on medication

## 2022-12-28 NOTE — Assessment & Plan Note (Signed)
Chase [name] is reminded of the importance of commitment to daily physical activity for 30 minutes or more, as able and the need to limit carbohydrate intake to 30 to 60 grams per meal to help with blood sugar control.   The need to take medication as prescribed, test blood sugar as directed, and to call between visits if there is a concern that blood sugar is uncontrolled is also discussed.   Chase [name] is reminded of the importance of daily foot exam, annual eye examination, and good blood sugar, blood pressure and cholesterol control.     Latest Ref Rng & Units 09/07/2022    1:47 PM 04/08/2022    2:15 PM 10/06/2021    1:21 PM 05/13/2021    1:30 PM 04/03/2021    1:25 PM  Diabetic Labs  HbA1c 4.0 - 5.6 % 5.7  5.8  5.7   5.9   Creatinine 0.61 - 1.24 mg/dL    3.25        12/25/2022    2:31 PM 09/22/2022    2:43 PM 09/07/2022    1:27 PM 08/06/2022    2:12 PM 05/06/2022    2:47 PM 04/08/2022    1:48 PM 02/04/2022    3:57 PM  BP/Weight  Systolic BP 373 578 978 [phone]  Diastolic BP 63 77 68 70 70 55 70  Wt. (Lbs)     166    BMI     25.24 kg/m2        12/20/2018    1:40 PM 02/25/2017    2:40 PM  Foot/eye exam completion dates  Foot Form Completion Done Done      Controlled, no change in medication Managed by Endo

## 2022-12-28 NOTE — Assessment & Plan Note (Addendum)
Daily montelukast and tessalon perle, portable CXR from h/ h to be ordered

## 2022-12-28 NOTE — Assessment & Plan Note (Signed)
Progressing extremely delusional thinking and behavior reportedly becoming more aggressive/ uncontrolled when he cannot get to do what he wants like sit outside in the cold

## 2022-12-29 ENCOUNTER — Ambulatory Visit (INDEPENDENT_AMBULATORY_CARE_PROVIDER_SITE_OTHER): Payer: Medicare Other | Admitting: *Deleted

## 2022-12-29 DIAGNOSIS — I150 Renovascular hypertension: Secondary | ICD-10-CM | POA: Diagnosis not present

## 2022-12-29 DIAGNOSIS — E1121 Type 2 diabetes mellitus with diabetic nephropathy: Secondary | ICD-10-CM

## 2022-12-29 DIAGNOSIS — L89152 Pressure ulcer of sacral region, stage 2: Secondary | ICD-10-CM | POA: Diagnosis not present

## 2022-12-29 DIAGNOSIS — L89611 Pressure ulcer of right heel, stage 1: Secondary | ICD-10-CM | POA: Diagnosis not present

## 2022-12-29 DIAGNOSIS — G40209 Localization-related (focal) (partial) symptomatic epilepsy and epileptic syndromes with complex partial seizures, not intractable, without status epilepticus: Secondary | ICD-10-CM | POA: Diagnosis not present

## 2022-12-29 DIAGNOSIS — I10 Essential (primary) hypertension: Secondary | ICD-10-CM

## 2022-12-29 DIAGNOSIS — I509 Heart failure, unspecified: Secondary | ICD-10-CM | POA: Diagnosis not present

## 2022-12-29 NOTE — Patient Instructions (Signed)
Please call the care guide team at [phone] if you need to cancel or reschedule your appointment.   If you are experiencing a Mental Health or [place] or need someone to talk to, please call the Suicide and Crisis Lifeline: 988 call the [place] National Suicide Prevention Lifeline: 563-119-3037 or TTY: (334)001-8381 TTY (505)244-4593) to talk to a trained counselor call 1-800-273-TALK (toll free, 24 hour hotline) go to [facility] [address] [place] [phone]) call the [facility]: [phone] call 911   Following is a copy of the CCM Program Consent:  CCM service includes personalized support from designated clinical staff supervised by the physician, including individualized plan of care and coordination with other care providers 24/7 contact phone numbers for assistance for urgent and routine care needs. Service will only be billed when office clinical staff spend 20 minutes or more in a month to coordinate care. Only one practitioner may furnish and bill the service in a calendar month. The patient may stop CCM services at amy time (effective at the end of the month) by phone call to the office staff. The patient will be responsible for cost sharing (co-pay) or up to 20% of the service fee (after annual deductible is met)  Following is a copy of your full provider care plan:   Goals Addressed             This Visit's Progress    CCM (CHRONIC KIDNEY DISEASE) EXPECTED OUTCOME: MONITOR, SELF-MANAGE AND REDUCE SYMPTOMS OF CHRONIC KIDNEY DISEASE       Current Barriers:  Knowledge Deficits related to CKD stage 4 Chronic Disease Management support and education needs related to CKD stage 4 Cognitive Deficits No Advanced Directives in place- family declines  Planned Interventions: Evaluation of current treatment plan related to chronic kidney disease self management and patient's adherence to plan as  established by provider      Reviewed prescribed diet low sodium Reviewed medications with patient and discussed importance of compliance    Discussed complications of poorly controlled blood pressure such as heart disease, stroke, circulatory complications, vision complications, kidney impairment, sexual dysfunction    Reviewed scheduled/upcoming provider appointments including    Advised patient to discuss change in health status with provider    Screening for signs and symptoms of depression related to chronic disease state      Reviewed importance of eating small portions of protein foods Reviewed choosing water to drink and offering to pt throughout the day Reinforced importance of changing positions q 2 hours  Symptom Management: Take medications as prescribed   Attend all scheduled provider appointments Call pharmacy for medication refills 3-7 days in advance of running out of medications Call provider office for new concerns or questions  Choose water as main beverage Offer food and beverage to patient throughout the day Read labels for sodium content/ avoid salty snacks and fast food Eat small portions of protein foods Change patient's positions q 2 hours  Follow Up Plan: Telephone follow up appointment with care management team member scheduled for:  02/15/23 at 215 pm       CCM (DIABETES) EXPECTED OUTCOME:  MONITOR, SELF-MANAGE AND REDUCE SYMPTOMS OF DIABETES       Current Barriers:  Knowledge Deficits related to Diabetes Chronic Disease Management support and education needs related to Diabetes and diet Cognitive Deficits No Advanced Directives in place- family declines Per spouse, CBG fasting ranges are 86-120,  uses Freestyle Libre to monitor,  reports pt has all medications and taking as prescribed Spouse reports there is someone there every evening to help get pt in the bed, reports they now have assistance from home health (through patient's insurance benefit) 2 days  per week and states "this is very helpful" Spouse reports patient has pressure ulcer to right foot and buttock and states caregiver changes dressing at night, spouse states she does not know if the ulcers are better or worse  Planned Interventions: Provided education to patient about basic DM disease process; Reviewed medications with patient and discussed importance of medication adherence;        Reviewed prescribed diet with patient carbohydrate modified; Counseled on importance of regular laboratory monitoring as prescribed;        call provider for findings outside established parameters;       Review of patient status, including review of consultants reports, relevant laboratory and other test results, and medications completed;       Reviewed importance of good blood sugar control for wound healing Reviewed that wound or infection can elevate blood sugar Reviewed signs/ symptoms of infection  Symptom Management: Take medications as prescribed   Attend all scheduled provider appointments Call pharmacy for medication refills 3-7 days in advance of running out of medications Call provider office for new concerns or questions  check blood sugar at prescribed times: Freestyle Libre  check feet daily for cuts, sores or redness take the blood sugar log to all doctor visits take the blood sugar meter to all doctor visits trim toenails straight across fill half of plate with vegetables limit fast food meals to no more than 1 per week prepare main meal at home 3 to 5 days each week read food labels for fat, fiber, carbohydrates and portion size Report any new symptoms/ change in health status to your doctor  Report any worsening of wounds to your doctor  Follow Up Plan: Telephone follow up appointment with care management team member scheduled for:  02/15/23 at 215 pm       CCM (HYPERTENSION) EXPECTED OUTCOME: MONITOR, SELF-MANAGE AND REDUCE SYMPTOMS OF HYPERTENSION       Current  Barriers:  Knowledge Deficits related to Hypertension Chronic Disease Management support and education needs related to Hypertension, diet Cognitive Deficits No Advanced Directives in place- family declines Spoke with patient's wife Corin Tilly, patient's adult daughters are very active in the care of pt, pt has aide that comes in twice daily, morning and afternoon and assists with ADL's for the patient, now has assistance 2 days per week through patient's insurance benefit, pt has WC, walker, cane, patient has dementia and is on aricept, is now hydroxyzine prn for agitation, family does not monitor blood pressure and states palliative nurse no longer sees pt (per family request), pt has Stage 1 ulcer on lower buttock area and right foot, caregiver has been dressing this area, family states it is very difficult to get patient to change positions q 2 hours, there is now a caregiver who comes in at night to get pt into bed and this is working out well.    Planned Interventions: Evaluation of current treatment plan related to hypertension self management and patient's adherence to plan as established by provider;   Reviewed prescribed diet low sodium (pt has written diet information)  Reviewed medications with patient and discussed importance of compliance;  Discussed plans with patient for ongoing care management follow up and provided patient with direct contact information for care management team;  Advised patient, providing education and rationale, to monitor blood pressure daily and record, calling PCP for findings outside established parameters;  Advised patient to discuss worsening would status with provider; Discussed complications of poorly controlled blood pressure such as heart disease, stroke, circulatory complications, vision complications, kidney impairment, sexual dysfunction;  Reviewed importance of changing positions q 2 hours Reviewed importance of good nutrition Reviewed signs/  symptoms of infection  Symptom Management: Take medications as prescribed   Attend all scheduled provider appointments Call pharmacy for medication refills 3-7 days in advance of running out of medications Perform all self care activities independently  Call provider office for new concerns or questions  check blood pressure weekly choose a place to take my blood pressure (home, clinic or office, retail store) write blood pressure results in a log or diary keep a blood pressure log take blood pressure log to all doctor appointments keep all doctor appointments take medications for blood pressure exactly as prescribed report new symptoms to your doctor eat more whole grains, fruits and vegetables, lean meats and healthy fats Follow low sodium diet- read labels Change positions every 2 hours- this is very important Please call doctor if wound to bottom looks worse (redness, increased drainage, odor, fever)  Follow Up Plan: Telephone follow up appointment with care management team member scheduled for: 02/15/23 at 215 pm          The patient verbalized understanding of instructions, educational materials, and care plan provided today and DECLINED offer to receive copy of patient instructions, educational materials, and care plan.   Telephone follow up appointment with care management team member scheduled for:   02/15/23 at 215 pm

## 2022-12-29 NOTE — Chronic Care Management (AMB) (Signed)
Chronic Care Management   CCM RN Visit Note  12/29/2022 Name: Chase [name] MRN: [id number] DOB: 1931/07/28  Subjective: Chase [name] is a 89 y.o. year old male who is a primary care patient of Chase Helper, MD. The patient was referred to the Chronic Care Management team for assistance with care management needs subsequent to provider initiation of CCM services and plan of care.    Today's Visit:  Engaged with patient by telephone for follow up visit.        Goals Addressed             This Visit's Progress    CCM (CHRONIC KIDNEY DISEASE) EXPECTED OUTCOME: MONITOR, SELF-MANAGE AND REDUCE SYMPTOMS OF CHRONIC KIDNEY DISEASE       Current Barriers:  Knowledge Deficits related to CKD stage 4 Chronic Disease Management support and education needs related to CKD stage 4 Cognitive Deficits No Advanced Directives in place- family declines  Planned Interventions: Evaluation of current treatment plan related to chronic kidney disease self management and patient's adherence to plan as established by provider      Reviewed prescribed diet low sodium Reviewed medications with patient and discussed importance of compliance    Discussed complications of poorly controlled blood pressure such as heart disease, stroke, circulatory complications, vision complications, kidney impairment, sexual dysfunction    Reviewed scheduled/upcoming provider appointments including    Advised patient to discuss change in health status with provider    Screening for signs and symptoms of depression related to chronic disease state      Reviewed importance of eating small portions of protein foods Reviewed choosing water to drink and offering to pt throughout the day Reinforced importance of changing positions q 2 hours  Symptom Management: Take medications as prescribed   Attend all scheduled provider appointments Call pharmacy for medication refills 3-7 days in advance of running out of  medications Call provider office for new concerns or questions  Choose water as main beverage Offer food and beverage to patient throughout the day Read labels for sodium content/ avoid salty snacks and fast food Eat small portions of protein foods Change patient's positions q 2 hours  Follow Up Plan: Telephone follow up appointment with care management team member scheduled for:  02/15/23 at 215 pm       CCM (DIABETES) EXPECTED OUTCOME:  MONITOR, SELF-MANAGE AND REDUCE SYMPTOMS OF DIABETES       Current Barriers:  Knowledge Deficits related to Diabetes Chronic Disease Management support and education needs related to Diabetes and diet Cognitive Deficits No Advanced Directives in place- family declines Per spouse, CBG fasting ranges are 86-120,  uses Freestyle Libre to monitor, reports pt has all medications and taking as prescribed Spouse reports there is someone there every evening to help get pt in the bed, reports they now have assistance from home health (through patient's insurance benefit) 2 days per week and states "this is very helpful" Spouse reports patient has pressure ulcer to right foot and buttock and states caregiver changes dressing at night, spouse states she does not know if the ulcers are better or worse  Planned Interventions: Provided education to patient about basic DM disease process; Reviewed medications with patient and discussed importance of medication adherence;        Reviewed prescribed diet with patient carbohydrate modified; Counseled on importance of regular laboratory monitoring as prescribed;        call provider for findings outside established parameters;  Review of patient status, including review of consultants reports, relevant laboratory and other test results, and medications completed;       Reviewed importance of good blood sugar control for wound healing Reviewed that wound or infection can elevate blood sugar Reviewed signs/ symptoms of  infection  Symptom Management: Take medications as prescribed   Attend all scheduled provider appointments Call pharmacy for medication refills 3-7 days in advance of running out of medications Call provider office for new concerns or questions  check blood sugar at prescribed times: Freestyle Libre  check feet daily for cuts, sores or redness take the blood sugar log to all doctor visits take the blood sugar meter to all doctor visits trim toenails straight across fill half of plate with vegetables limit fast food meals to no more than 1 per week prepare main meal at home 3 to 5 days each week read food labels for fat, fiber, carbohydrates and portion size Report any new symptoms/ change in health status to your doctor  Report any worsening of wounds to your doctor  Follow Up Plan: Telephone follow up appointment with care management team member scheduled for:  02/15/23 at 215 pm       CCM (HYPERTENSION) EXPECTED OUTCOME: MONITOR, SELF-MANAGE AND REDUCE SYMPTOMS OF HYPERTENSION       Current Barriers:  Knowledge Deficits related to Hypertension Chronic Disease Management support and education needs related to Hypertension, diet Cognitive Deficits No Advanced Directives in place- family declines Spoke with patient's wife Chase [name], patient's adult daughters are very active in the care of pt, pt has aide that comes in twice daily, morning and afternoon and assists with ADL's for the patient, now has assistance 2 days per week through patient's insurance benefit, pt has WC, walker, cane, patient has dementia and is on aricept, is now hydroxyzine prn for agitation, family does not monitor blood pressure and states palliative nurse no longer sees pt (per family request), pt has Stage 1 ulcer on lower buttock area and right foot, caregiver has been dressing this area, family states it is very difficult to get patient to change positions q 2 hours, there is now a caregiver who comes in at  night to get pt into bed and this is working out well.    Planned Interventions: Evaluation of current treatment plan related to hypertension self management and patient's adherence to plan as established by provider;   Reviewed prescribed diet low sodium (pt has written diet information)  Reviewed medications with patient and discussed importance of compliance;  Discussed plans with patient for ongoing care management follow up and provided patient with direct contact information for care management team; Advised patient, providing education and rationale, to monitor blood pressure daily and record, calling PCP for findings outside established parameters;  Advised patient to discuss worsening would status with provider; Discussed complications of poorly controlled blood pressure such as heart disease, stroke, circulatory complications, vision complications, kidney impairment, sexual dysfunction;  Reviewed importance of changing positions q 2 hours Reviewed importance of good nutrition Reviewed signs/ symptoms of infection  Symptom Management: Take medications as prescribed   Attend all scheduled provider appointments Call pharmacy for medication refills 3-7 days in advance of running out of medications Perform all self care activities independently  Call provider office for new concerns or questions  check blood pressure weekly choose a place to take my blood pressure (home, clinic or office, retail store) write blood pressure results in a log or diary keep a  blood pressure log take blood pressure log to all doctor appointments keep all doctor appointments take medications for blood pressure exactly as prescribed report new symptoms to your doctor eat more whole grains, fruits and vegetables, lean meats and healthy fats Follow low sodium diet- read labels Change positions every 2 hours- this is very important Please call doctor if wound to bottom looks worse (redness, increased  drainage, odor, fever)  Follow Up Plan: Telephone follow up appointment with care management team member scheduled for: 02/15/23 at 215 pm          Plan:Telephone follow up appointment with care management team member scheduled for:  02/15/23 at 215 pm  Jacqlyn Larsen [facility], BSN RN Case Manager [facility] [phone]

## 2022-12-30 ENCOUNTER — Telehealth: Payer: Self-pay | Admitting: Family Medicine

## 2022-12-30 NOTE — Telephone Encounter (Signed)
Chase [name] called from [facility] needs verbal orders.Physical therapy evaluation asking for occupational therapy evaluation. Chase [name] call back  [phone]

## 2022-12-30 NOTE — Telephone Encounter (Signed)
Verbal orders given

## 2022-12-31 DIAGNOSIS — L89611 Pressure ulcer of right heel, stage 1: Secondary | ICD-10-CM | POA: Diagnosis not present

## 2022-12-31 DIAGNOSIS — E1121 Type 2 diabetes mellitus with diabetic nephropathy: Secondary | ICD-10-CM | POA: Diagnosis not present

## 2022-12-31 DIAGNOSIS — L89152 Pressure ulcer of sacral region, stage 2: Secondary | ICD-10-CM | POA: Diagnosis not present

## 2022-12-31 DIAGNOSIS — I509 Heart failure, unspecified: Secondary | ICD-10-CM | POA: Diagnosis not present

## 2022-12-31 DIAGNOSIS — I150 Renovascular hypertension: Secondary | ICD-10-CM | POA: Diagnosis not present

## 2022-12-31 DIAGNOSIS — G40209 Localization-related (focal) (partial) symptomatic epilepsy and epileptic syndromes with complex partial seizures, not intractable, without status epilepticus: Secondary | ICD-10-CM | POA: Diagnosis not present

## 2023-01-01 ENCOUNTER — Telehealth: Payer: Self-pay | Admitting: Family Medicine

## 2023-01-01 DIAGNOSIS — E1121 Type 2 diabetes mellitus with diabetic nephropathy: Secondary | ICD-10-CM | POA: Diagnosis not present

## 2023-01-01 DIAGNOSIS — I509 Heart failure, unspecified: Secondary | ICD-10-CM | POA: Diagnosis not present

## 2023-01-01 DIAGNOSIS — L89611 Pressure ulcer of right heel, stage 1: Secondary | ICD-10-CM | POA: Diagnosis not present

## 2023-01-01 DIAGNOSIS — I150 Renovascular hypertension: Secondary | ICD-10-CM | POA: Diagnosis not present

## 2023-01-01 DIAGNOSIS — G40209 Localization-related (focal) (partial) symptomatic epilepsy and epileptic syndromes with complex partial seizures, not intractable, without status epilepticus: Secondary | ICD-10-CM | POA: Diagnosis not present

## 2023-01-01 DIAGNOSIS — L89152 Pressure ulcer of sacral region, stage 2: Secondary | ICD-10-CM | POA: Diagnosis not present

## 2023-01-01 NOTE — Telephone Encounter (Signed)
Patient's wife aware

## 2023-01-01 NOTE — Telephone Encounter (Signed)
Called in on patient behalf. Wants to know what montelukast (SINGULAIR) 10 MG tablet  Is prescribed for and also wants to see if patient can get something sent in for agitation. Wants a call back

## 2023-01-05 ENCOUNTER — Telehealth: Payer: Self-pay | Admitting: Family Medicine

## 2023-01-05 DIAGNOSIS — L89152 Pressure ulcer of sacral region, stage 2: Secondary | ICD-10-CM | POA: Diagnosis not present

## 2023-01-05 DIAGNOSIS — G40209 Localization-related (focal) (partial) symptomatic epilepsy and epileptic syndromes with complex partial seizures, not intractable, without status epilepticus: Secondary | ICD-10-CM | POA: Diagnosis not present

## 2023-01-05 DIAGNOSIS — L89611 Pressure ulcer of right heel, stage 1: Secondary | ICD-10-CM | POA: Diagnosis not present

## 2023-01-05 DIAGNOSIS — E1121 Type 2 diabetes mellitus with diabetic nephropathy: Secondary | ICD-10-CM | POA: Diagnosis not present

## 2023-01-05 DIAGNOSIS — I509 Heart failure, unspecified: Secondary | ICD-10-CM | POA: Diagnosis not present

## 2023-01-05 DIAGNOSIS — I150 Renovascular hypertension: Secondary | ICD-10-CM | POA: Diagnosis not present

## 2023-01-05 NOTE — Telephone Encounter (Signed)
Pt daughter aware

## 2023-01-05 NOTE — Telephone Encounter (Signed)
Pt wants to speak with nurse in regards to pt medications

## 2023-01-06 DIAGNOSIS — N184 Chronic kidney disease, stage 4 (severe): Secondary | ICD-10-CM

## 2023-01-06 DIAGNOSIS — E1121 Type 2 diabetes mellitus with diabetic nephropathy: Secondary | ICD-10-CM | POA: Diagnosis not present

## 2023-01-06 DIAGNOSIS — I129 Hypertensive chronic kidney disease with stage 1 through stage 4 chronic kidney disease, or unspecified chronic kidney disease: Secondary | ICD-10-CM | POA: Diagnosis not present

## 2023-01-06 DIAGNOSIS — Z794 Long term (current) use of insulin: Secondary | ICD-10-CM | POA: Diagnosis not present

## 2023-01-07 DIAGNOSIS — G40209 Localization-related (focal) (partial) symptomatic epilepsy and epileptic syndromes with complex partial seizures, not intractable, without status epilepticus: Secondary | ICD-10-CM | POA: Diagnosis not present

## 2023-01-07 DIAGNOSIS — E1121 Type 2 diabetes mellitus with diabetic nephropathy: Secondary | ICD-10-CM | POA: Diagnosis not present

## 2023-01-07 DIAGNOSIS — I150 Renovascular hypertension: Secondary | ICD-10-CM | POA: Diagnosis not present

## 2023-01-07 DIAGNOSIS — L89152 Pressure ulcer of sacral region, stage 2: Secondary | ICD-10-CM | POA: Diagnosis not present

## 2023-01-07 DIAGNOSIS — I509 Heart failure, unspecified: Secondary | ICD-10-CM | POA: Diagnosis not present

## 2023-01-07 DIAGNOSIS — L89611 Pressure ulcer of right heel, stage 1: Secondary | ICD-10-CM | POA: Diagnosis not present

## 2023-01-08 DIAGNOSIS — L89611 Pressure ulcer of right heel, stage 1: Secondary | ICD-10-CM | POA: Diagnosis not present

## 2023-01-08 DIAGNOSIS — L89152 Pressure ulcer of sacral region, stage 2: Secondary | ICD-10-CM | POA: Diagnosis not present

## 2023-01-08 DIAGNOSIS — G40209 Localization-related (focal) (partial) symptomatic epilepsy and epileptic syndromes with complex partial seizures, not intractable, without status epilepticus: Secondary | ICD-10-CM | POA: Diagnosis not present

## 2023-01-08 DIAGNOSIS — I509 Heart failure, unspecified: Secondary | ICD-10-CM | POA: Diagnosis not present

## 2023-01-08 DIAGNOSIS — I150 Renovascular hypertension: Secondary | ICD-10-CM | POA: Diagnosis not present

## 2023-01-08 DIAGNOSIS — E1121 Type 2 diabetes mellitus with diabetic nephropathy: Secondary | ICD-10-CM | POA: Diagnosis not present

## 2023-01-11 DIAGNOSIS — I5032 Chronic diastolic (congestive) heart failure: Secondary | ICD-10-CM | POA: Diagnosis not present

## 2023-01-11 DIAGNOSIS — E1129 Type 2 diabetes mellitus with other diabetic kidney complication: Secondary | ICD-10-CM | POA: Diagnosis not present

## 2023-01-11 DIAGNOSIS — R809 Proteinuria, unspecified: Secondary | ICD-10-CM | POA: Diagnosis not present

## 2023-01-11 DIAGNOSIS — I129 Hypertensive chronic kidney disease with stage 1 through stage 4 chronic kidney disease, or unspecified chronic kidney disease: Secondary | ICD-10-CM | POA: Diagnosis not present

## 2023-01-11 DIAGNOSIS — E1122 Type 2 diabetes mellitus with diabetic chronic kidney disease: Secondary | ICD-10-CM | POA: Diagnosis not present

## 2023-01-11 DIAGNOSIS — D631 Anemia in chronic kidney disease: Secondary | ICD-10-CM | POA: Diagnosis not present

## 2023-01-11 DIAGNOSIS — N189 Chronic kidney disease, unspecified: Secondary | ICD-10-CM | POA: Diagnosis not present

## 2023-01-12 ENCOUNTER — Telehealth: Payer: Self-pay | Admitting: Family Medicine

## 2023-01-12 DIAGNOSIS — I150 Renovascular hypertension: Secondary | ICD-10-CM | POA: Diagnosis not present

## 2023-01-12 DIAGNOSIS — G40209 Localization-related (focal) (partial) symptomatic epilepsy and epileptic syndromes with complex partial seizures, not intractable, without status epilepticus: Secondary | ICD-10-CM | POA: Diagnosis not present

## 2023-01-12 DIAGNOSIS — E1121 Type 2 diabetes mellitus with diabetic nephropathy: Secondary | ICD-10-CM | POA: Diagnosis not present

## 2023-01-12 DIAGNOSIS — L89611 Pressure ulcer of right heel, stage 1: Secondary | ICD-10-CM | POA: Diagnosis not present

## 2023-01-12 DIAGNOSIS — I509 Heart failure, unspecified: Secondary | ICD-10-CM | POA: Diagnosis not present

## 2023-01-12 DIAGNOSIS — L89152 Pressure ulcer of sacral region, stage 2: Secondary | ICD-10-CM | POA: Diagnosis not present

## 2023-01-12 NOTE — Telephone Encounter (Signed)
Called in on patient behalf in regard to [facility].  Wants to see if its would be good to use on patients wound.  Wants a call back

## 2023-01-13 ENCOUNTER — Other Ambulatory Visit: Payer: Self-pay

## 2023-01-13 ENCOUNTER — Ambulatory Visit: Payer: Self-pay | Admitting: *Deleted

## 2023-01-13 ENCOUNTER — Encounter: Payer: Self-pay | Admitting: *Deleted

## 2023-01-13 DIAGNOSIS — L89309 Pressure ulcer of unspecified buttock, unspecified stage: Secondary | ICD-10-CM

## 2023-01-13 MED ORDER — SANTYL 250 UNIT/GM EX OINT: 1.0000 | TOPICAL_OINTMENT | Freq: Every day | CUTANEOUS | 0 refills | Status: DC

## 2023-01-13 NOTE — Patient Outreach (Signed)
  Care Coordination   Follow Up Visit Note   01/13/2023  Name: Chase [name] MRN: [id number] DOB: 1931-07-01  Chase [name] is a 89 y.o. year old male who sees Moshe Cipro, Norwood Levo, MD for primary care. I spoke with patient's daughter, Chase [name] by phone today.  What matters to the patients health and wellness today?   Receive Assistance Arranging In-Home Care Services.   Goals Addressed             This Visit's Progress    COMPLETED: Receive Assistance Arranging In-Home Care Services.   On track    Care Coordination Interventions:    Active Listening & Reflection Utilized.  Verbalization of Feelings Encouraged. Emotional & Caregiver Support Provided. Caregiver Stress Acknowledged. Caregiver Resources Revisited. Self-Enrollment in Caregiver Support Group Emphasized, from List Provided. Problem Solving Interventions Implemented. Task-Centered Solutions Employed.   Solution-Focused Strategies Activated. Acceptance & Commitment Therapy Introduced. Brief Cognitive Behavioral Therapy Initiated. CSW Collaboration with Patient's Daughter, Chase [name] to [place], through [facility] [phone]), Currently in Place. ~ [place] - Visit 1 Time Per Week ~ [place] Therapist -  Visit 1 Time Per Week ~ Occupational Therapist - Visit 1 Time Per Week ~ Chase [name] - Visits 2 Times Per Week  CSW Collaboration with Patient's Daughter, Chase [name] to [employer] [place] through [place] [phone]), 4 Hours Per Day, 5 Days Per Week (Monday - Friday). Please Contact CSW Directly (# [phone]), If You Have Questions, or If Additional Social Work Needs Are Identified in The Near Future.              SDOH assessments and interventions completed:  Yes.  Care Coordination Interventions:  Yes, provided.   Follow up plan: No  further intervention required.   Encounter Outcome:  Pt. Visit Completed.   Nat Christen, BSW, MSW, LCSW  Licensed Education officer, environmental Health System  Mailing [place] [address] [place], [place] [address] Physical Address-[address] [place], [place] [address] Toll Free Main # [phone] Fax # [phone] Cell # [phone] [email or web address]

## 2023-01-13 NOTE — Patient Instructions (Signed)
Visit Information  Thank you for taking time to visit with me today. Please don't hesitate to contact me if I can be of assistance to you.   Following are the goals we discussed today:   Goals Addressed             This Visit's Progress    COMPLETED: [place].   On track    Care Coordination Interventions:    Active Listening & Reflection Utilized.  Verbalization of Feelings Encouraged. Emotional & Caregiver Support Provided. Caregiver Stress Acknowledged. Caregiver Resources Revisited. Self-Enrollment in Caregiver Support Group Emphasized, from List Provided. Problem Solving Interventions Implemented. Task-Centered Solutions Employed.   Solution-Focused Strategies Activated. Acceptance & Commitment Therapy Introduced. Brief Cognitive Behavioral Therapy Initiated. CSW Collaboration with Patient's Daughter, Kailash Hinze to [place], through [facility] [phone]), Currently in Place. ~ [place] - Visit 1 Time Per Week ~ [place] Therapist -  Visit 1 Time Per Week ~ Occupational Therapist - Visit 1 Time Per Week ~ [place]nora Morrison - Visits 2 Times Per Week  CSW Collaboration with Patient's Daughter, Heywood Tokunaga to [employer] [place] through [place] [phone]), 4 Hours Per Day, 5 Days Per Week (Monday - Friday). Please Contact CSW Directly (# [phone]), If You Have Questions, or If Additional Social Work Needs Are Identified in The Near Future.              Please call the care guide team at [phone] if you need to cancel or reschedule your appointment.   If you are experiencing a Mental Health or [place] or need someone to talk to, please call the Suicide and Crisis Lifeline: 988 call the [place] National Suicide Prevention Lifeline: (573) 483-5169 or TTY:  978-631-8571 TTY 762-160-5208) to talk to a trained counselor call 1-800-273-TALK (toll free, 24 hour hotline) go to [facility] [address], [place] [phone]) call the [place]: [phone] call 911  Patient verbalizes understanding of instructions and care plan provided today and agrees to view in [place]. Active MyChart status and patient understanding of how to access instructions and care plan via MyChart confirmed with patient.     No further follow up required.    Nat Christen, BSW, MSW, LCSW  Licensed Education officer, environmental Health System  Mailing [place] [address], [place], [place] [address] Physical Address-[address], [place], [place] [address] Toll Free Main # [phone] Fax # [phone] Cell # [phone] [email or web address]

## 2023-01-13 NOTE — Telephone Encounter (Signed)
Medication sent..

## 2023-01-14 DIAGNOSIS — I509 Heart failure, unspecified: Secondary | ICD-10-CM | POA: Diagnosis not present

## 2023-01-14 DIAGNOSIS — E1121 Type 2 diabetes mellitus with diabetic nephropathy: Secondary | ICD-10-CM | POA: Diagnosis not present

## 2023-01-14 DIAGNOSIS — G40209 Localization-related (focal) (partial) symptomatic epilepsy and epileptic syndromes with complex partial seizures, not intractable, without status epilepticus: Secondary | ICD-10-CM | POA: Diagnosis not present

## 2023-01-14 DIAGNOSIS — I150 Renovascular hypertension: Secondary | ICD-10-CM | POA: Diagnosis not present

## 2023-01-14 DIAGNOSIS — L89611 Pressure ulcer of right heel, stage 1: Secondary | ICD-10-CM | POA: Diagnosis not present

## 2023-01-14 DIAGNOSIS — L89152 Pressure ulcer of sacral region, stage 2: Secondary | ICD-10-CM | POA: Diagnosis not present

## 2023-01-15 DIAGNOSIS — I150 Renovascular hypertension: Secondary | ICD-10-CM | POA: Diagnosis not present

## 2023-01-15 DIAGNOSIS — E1121 Type 2 diabetes mellitus with diabetic nephropathy: Secondary | ICD-10-CM | POA: Diagnosis not present

## 2023-01-15 DIAGNOSIS — L89152 Pressure ulcer of sacral region, stage 2: Secondary | ICD-10-CM | POA: Diagnosis not present

## 2023-01-15 DIAGNOSIS — L89611 Pressure ulcer of right heel, stage 1: Secondary | ICD-10-CM | POA: Diagnosis not present

## 2023-01-15 DIAGNOSIS — G40209 Localization-related (focal) (partial) symptomatic epilepsy and epileptic syndromes with complex partial seizures, not intractable, without status epilepticus: Secondary | ICD-10-CM | POA: Diagnosis not present

## 2023-01-15 DIAGNOSIS — N184 Chronic kidney disease, stage 4 (severe): Secondary | ICD-10-CM | POA: Diagnosis not present

## 2023-01-15 DIAGNOSIS — I509 Heart failure, unspecified: Secondary | ICD-10-CM | POA: Diagnosis not present

## 2023-01-18 DIAGNOSIS — E1121 Type 2 diabetes mellitus with diabetic nephropathy: Secondary | ICD-10-CM | POA: Diagnosis not present

## 2023-01-18 DIAGNOSIS — G40209 Localization-related (focal) (partial) symptomatic epilepsy and epileptic syndromes with complex partial seizures, not intractable, without status epilepticus: Secondary | ICD-10-CM | POA: Diagnosis not present

## 2023-01-18 DIAGNOSIS — L89152 Pressure ulcer of sacral region, stage 2: Secondary | ICD-10-CM | POA: Diagnosis not present

## 2023-01-18 DIAGNOSIS — L89611 Pressure ulcer of right heel, stage 1: Secondary | ICD-10-CM | POA: Diagnosis not present

## 2023-01-18 DIAGNOSIS — I150 Renovascular hypertension: Secondary | ICD-10-CM | POA: Diagnosis not present

## 2023-01-18 DIAGNOSIS — I509 Heart failure, unspecified: Secondary | ICD-10-CM | POA: Diagnosis not present

## 2023-01-19 ENCOUNTER — Other Ambulatory Visit: Payer: Self-pay | Admitting: Family Medicine

## 2023-01-19 ENCOUNTER — Telehealth: Payer: Self-pay | Admitting: Family Medicine

## 2023-01-19 ENCOUNTER — Other Ambulatory Visit: Payer: Self-pay

## 2023-01-19 DIAGNOSIS — G40209 Localization-related (focal) (partial) symptomatic epilepsy and epileptic syndromes with complex partial seizures, not intractable, without status epilepticus: Secondary | ICD-10-CM

## 2023-01-19 DIAGNOSIS — I150 Renovascular hypertension: Secondary | ICD-10-CM | POA: Diagnosis not present

## 2023-01-19 DIAGNOSIS — E1121 Type 2 diabetes mellitus with diabetic nephropathy: Secondary | ICD-10-CM | POA: Diagnosis not present

## 2023-01-19 DIAGNOSIS — L89152 Pressure ulcer of sacral region, stage 2: Secondary | ICD-10-CM | POA: Diagnosis not present

## 2023-01-19 DIAGNOSIS — I509 Heart failure, unspecified: Secondary | ICD-10-CM | POA: Diagnosis not present

## 2023-01-19 DIAGNOSIS — L89611 Pressure ulcer of right heel, stage 1: Secondary | ICD-10-CM | POA: Diagnosis not present

## 2023-01-19 MED ORDER — LEVETIRACETAM 100 MG/ML PO SOLN: 500.0000 mg | Freq: Every day | ORAL | 3 refills | Status: DC

## 2023-01-19 NOTE — Telephone Encounter (Signed)
Refills sent

## 2023-01-19 NOTE — Telephone Encounter (Signed)
Called in on patient behalf.  Patient needs refill on   levETIRAcetam (KEPPRA) 100 MG/ML solution    Wants a call back when med is sent over to pharm.

## 2023-01-20 DIAGNOSIS — G40209 Localization-related (focal) (partial) symptomatic epilepsy and epileptic syndromes with complex partial seizures, not intractable, without status epilepticus: Secondary | ICD-10-CM | POA: Diagnosis not present

## 2023-01-20 DIAGNOSIS — L89152 Pressure ulcer of sacral region, stage 2: Secondary | ICD-10-CM | POA: Diagnosis not present

## 2023-01-20 DIAGNOSIS — E1121 Type 2 diabetes mellitus with diabetic nephropathy: Secondary | ICD-10-CM | POA: Diagnosis not present

## 2023-01-20 DIAGNOSIS — L89611 Pressure ulcer of right heel, stage 1: Secondary | ICD-10-CM | POA: Diagnosis not present

## 2023-01-20 DIAGNOSIS — I150 Renovascular hypertension: Secondary | ICD-10-CM | POA: Diagnosis not present

## 2023-01-20 DIAGNOSIS — I509 Heart failure, unspecified: Secondary | ICD-10-CM | POA: Diagnosis not present

## 2023-01-21 DIAGNOSIS — E1121 Type 2 diabetes mellitus with diabetic nephropathy: Secondary | ICD-10-CM | POA: Diagnosis not present

## 2023-01-21 DIAGNOSIS — I509 Heart failure, unspecified: Secondary | ICD-10-CM | POA: Diagnosis not present

## 2023-01-21 DIAGNOSIS — I150 Renovascular hypertension: Secondary | ICD-10-CM | POA: Diagnosis not present

## 2023-01-21 DIAGNOSIS — L89611 Pressure ulcer of right heel, stage 1: Secondary | ICD-10-CM | POA: Diagnosis not present

## 2023-01-21 DIAGNOSIS — L89152 Pressure ulcer of sacral region, stage 2: Secondary | ICD-10-CM | POA: Diagnosis not present

## 2023-01-21 DIAGNOSIS — G40209 Localization-related (focal) (partial) symptomatic epilepsy and epileptic syndromes with complex partial seizures, not intractable, without status epilepticus: Secondary | ICD-10-CM | POA: Diagnosis not present

## 2023-01-22 ENCOUNTER — Telehealth: Payer: Self-pay | Admitting: Family Medicine

## 2023-01-22 DIAGNOSIS — G40209 Localization-related (focal) (partial) symptomatic epilepsy and epileptic syndromes with complex partial seizures, not intractable, without status epilepticus: Secondary | ICD-10-CM | POA: Diagnosis not present

## 2023-01-22 DIAGNOSIS — I509 Heart failure, unspecified: Secondary | ICD-10-CM | POA: Diagnosis not present

## 2023-01-22 DIAGNOSIS — I150 Renovascular hypertension: Secondary | ICD-10-CM | POA: Diagnosis not present

## 2023-01-22 DIAGNOSIS — L89611 Pressure ulcer of right heel, stage 1: Secondary | ICD-10-CM | POA: Diagnosis not present

## 2023-01-22 DIAGNOSIS — L89152 Pressure ulcer of sacral region, stage 2: Secondary | ICD-10-CM | POA: Diagnosis not present

## 2023-01-22 DIAGNOSIS — E1121 Type 2 diabetes mellitus with diabetic nephropathy: Secondary | ICD-10-CM | POA: Diagnosis not present

## 2023-01-22 NOTE — Telephone Encounter (Signed)
Patient called said he is constipated and has some polyethylene asking if this can be given to patient if it will help. Call back # [phone].

## 2023-01-22 NOTE — Telephone Encounter (Signed)
Patients wife aware

## 2023-01-24 DIAGNOSIS — F02B11 Dementia in other diseases classified elsewhere, moderate, with agitation: Secondary | ICD-10-CM | POA: Diagnosis not present

## 2023-01-24 DIAGNOSIS — E038 Other specified hypothyroidism: Secondary | ICD-10-CM | POA: Diagnosis not present

## 2023-01-24 DIAGNOSIS — G40209 Localization-related (focal) (partial) symptomatic epilepsy and epileptic syndromes with complex partial seizures, not intractable, without status epilepticus: Secondary | ICD-10-CM | POA: Diagnosis not present

## 2023-01-24 DIAGNOSIS — E1121 Type 2 diabetes mellitus with diabetic nephropathy: Secondary | ICD-10-CM | POA: Diagnosis not present

## 2023-01-24 DIAGNOSIS — L89311 Pressure ulcer of right buttock, stage 1: Secondary | ICD-10-CM | POA: Diagnosis not present

## 2023-01-24 DIAGNOSIS — Z7902 Long term (current) use of antithrombotics/antiplatelets: Secondary | ICD-10-CM | POA: Diagnosis not present

## 2023-01-24 DIAGNOSIS — I509 Heart failure, unspecified: Secondary | ICD-10-CM | POA: Diagnosis not present

## 2023-01-24 DIAGNOSIS — Z7401 Bed confinement status: Secondary | ICD-10-CM | POA: Diagnosis not present

## 2023-01-24 DIAGNOSIS — E782 Mixed hyperlipidemia: Secondary | ICD-10-CM | POA: Diagnosis not present

## 2023-01-24 DIAGNOSIS — L89152 Pressure ulcer of sacral region, stage 2: Secondary | ICD-10-CM | POA: Diagnosis not present

## 2023-01-24 DIAGNOSIS — L89611 Pressure ulcer of right heel, stage 1: Secondary | ICD-10-CM | POA: Diagnosis not present

## 2023-01-24 DIAGNOSIS — I150 Renovascular hypertension: Secondary | ICD-10-CM | POA: Diagnosis not present

## 2023-01-25 ENCOUNTER — Ambulatory Visit: Payer: Medicare Other

## 2023-01-26 DIAGNOSIS — E1121 Type 2 diabetes mellitus with diabetic nephropathy: Secondary | ICD-10-CM | POA: Diagnosis not present

## 2023-01-26 DIAGNOSIS — I150 Renovascular hypertension: Secondary | ICD-10-CM | POA: Diagnosis not present

## 2023-01-26 DIAGNOSIS — I509 Heart failure, unspecified: Secondary | ICD-10-CM | POA: Diagnosis not present

## 2023-01-26 DIAGNOSIS — L89152 Pressure ulcer of sacral region, stage 2: Secondary | ICD-10-CM | POA: Diagnosis not present

## 2023-01-26 DIAGNOSIS — G40209 Localization-related (focal) (partial) symptomatic epilepsy and epileptic syndromes with complex partial seizures, not intractable, without status epilepticus: Secondary | ICD-10-CM | POA: Diagnosis not present

## 2023-01-26 DIAGNOSIS — L89611 Pressure ulcer of right heel, stage 1: Secondary | ICD-10-CM | POA: Diagnosis not present

## 2023-01-27 DIAGNOSIS — G40209 Localization-related (focal) (partial) symptomatic epilepsy and epileptic syndromes with complex partial seizures, not intractable, without status epilepticus: Secondary | ICD-10-CM | POA: Diagnosis not present

## 2023-01-27 DIAGNOSIS — I509 Heart failure, unspecified: Secondary | ICD-10-CM | POA: Diagnosis not present

## 2023-01-27 DIAGNOSIS — L89152 Pressure ulcer of sacral region, stage 2: Secondary | ICD-10-CM | POA: Diagnosis not present

## 2023-01-27 DIAGNOSIS — L89611 Pressure ulcer of right heel, stage 1: Secondary | ICD-10-CM | POA: Diagnosis not present

## 2023-01-27 DIAGNOSIS — I150 Renovascular hypertension: Secondary | ICD-10-CM | POA: Diagnosis not present

## 2023-01-27 DIAGNOSIS — E1121 Type 2 diabetes mellitus with diabetic nephropathy: Secondary | ICD-10-CM | POA: Diagnosis not present

## 2023-01-28 ENCOUNTER — Ambulatory Visit (INDEPENDENT_AMBULATORY_CARE_PROVIDER_SITE_OTHER): Payer: Medicare Other

## 2023-01-28 ENCOUNTER — Telehealth: Payer: Self-pay

## 2023-01-28 DIAGNOSIS — I509 Heart failure, unspecified: Secondary | ICD-10-CM | POA: Diagnosis not present

## 2023-01-28 DIAGNOSIS — G40209 Localization-related (focal) (partial) symptomatic epilepsy and epileptic syndromes with complex partial seizures, not intractable, without status epilepticus: Secondary | ICD-10-CM | POA: Diagnosis not present

## 2023-01-28 DIAGNOSIS — L89611 Pressure ulcer of right heel, stage 1: Secondary | ICD-10-CM | POA: Diagnosis not present

## 2023-01-28 DIAGNOSIS — Z0000 Encounter for general adult medical examination without abnormal findings: Secondary | ICD-10-CM | POA: Diagnosis not present

## 2023-01-28 DIAGNOSIS — L89152 Pressure ulcer of sacral region, stage 2: Secondary | ICD-10-CM | POA: Diagnosis not present

## 2023-01-28 DIAGNOSIS — E1121 Type 2 diabetes mellitus with diabetic nephropathy: Secondary | ICD-10-CM | POA: Diagnosis not present

## 2023-01-28 DIAGNOSIS — I150 Renovascular hypertension: Secondary | ICD-10-CM | POA: Diagnosis not present

## 2023-01-28 NOTE — Patient Instructions (Signed)
Health Maintenance, Male Adopting a healthy lifestyle and getting preventive care are important in promoting health and wellness. Ask your health care provider about: The right schedule for you to have regular tests and exams. Things you can do on your own to prevent diseases and keep yourself healthy. What should I know about diet, weight, and exercise? Eat a healthy diet  Eat a diet that includes plenty of vegetables, fruits, low-fat dairy products, and lean protein. Do not eat a lot of foods that are high in solid fats, added sugars, or sodium. Maintain a healthy weight Body mass index (BMI) is a measurement that can be used to identify possible weight problems. It estimates body fat based on height and weight. Your health care provider can help determine your BMI and help you achieve or maintain a healthy weight. Get regular exercise Get regular exercise. This is one of the most important things you can do for your health. Most adults should: Exercise for at least 150 minutes each week. The exercise should increase your heart rate and make you sweat (moderate-intensity exercise). Do strengthening exercises at least twice a week. This is in addition to the moderate-intensity exercise. Spend less time sitting. Even light physical activity can be beneficial. Watch cholesterol and blood lipids Have your blood tested for lipids and cholesterol at 87 years of age, then have this test every 5 years. You may need to have your cholesterol levels checked more often if: Your lipid or cholesterol levels are high. You are older than 87 years of age. You are at high risk for heart disease. What should I know about cancer screening? Many types of cancers can be detected early and may often be prevented. Depending on your health history and family history, you may need to have cancer screening at various ages. This may include screening for: Colorectal cancer. Prostate cancer. Skin cancer. Lung  cancer. What should I know about heart disease, diabetes, and high blood pressure? Blood pressure and heart disease High blood pressure causes heart disease and increases the risk of stroke. This is more likely to develop in people who have high blood pressure readings or are overweight. Talk with your health care provider about your target blood pressure readings. Have your blood pressure checked: Every 3-5 years if you are 18-39 years of age. Every year if you are 40 years old or older. If you are between the ages of 65 and 75 and are a current or former smoker, ask your health care provider if you should have a one-time screening for abdominal aortic aneurysm (AAA). Diabetes Have regular diabetes screenings. This checks your fasting blood sugar level. Have the screening done: Once every three years after age 45 if you are at a normal weight and have a low risk for diabetes. More often and at a younger age if you are overweight or have a high risk for diabetes. What should I know about preventing infection? Hepatitis B If you have a higher risk for hepatitis B, you should be screened for this virus. Talk with your health care provider to find out if you are at risk for hepatitis B infection. Hepatitis C Blood testing is recommended for: Everyone born from 1945 through 1965. Anyone with known risk factors for hepatitis C. Sexually transmitted infections (STIs) You should be screened each year for STIs, including gonorrhea and chlamydia, if: You are sexually active and are younger than 87 years of age. You are older than 87 years of age and your   health care provider tells you that you are at risk for this type of infection. Your sexual activity has changed since you were last screened, and you are at increased risk for chlamydia or gonorrhea. Ask your health care provider if you are at risk. Ask your health care provider about whether you are at high risk for HIV. Your health care provider  may recommend a prescription medicine to help prevent HIV infection. If you choose to take medicine to prevent HIV, you should first get tested for HIV. You should then be tested every 3 months for as long as you are taking the medicine. Follow these instructions at home: Alcohol use Do not drink alcohol if your health care provider tells you not to drink. If you drink alcohol: Limit how much you have to 0-2 drinks a day. Know how much alcohol is in your drink. In the [place] one drink equals one 12 oz bottle of beer (355 mL), one 5 oz glass of wine (148 mL), or one 1 oz glass of hard liquor (44 mL). Lifestyle Do not use any products that contain nicotine or tobacco. These products include cigarettes, chewing tobacco, and vaping devices, such as e-cigarettes. If you need help quitting, ask your health care provider. Do not use street drugs. Do not share needles. Ask your health care provider for help if you need support or information about quitting drugs. General instructions Schedule regular health, dental, and eye exams. Stay current with your vaccines. Tell your health care provider if: You often feel depressed. You have ever been abused or do not feel safe at home. Summary Adopting a healthy lifestyle and getting preventive care are important in promoting health and wellness. Follow your health care provider's instructions about healthy diet, exercising, and getting tested or screened for diseases. Follow your health care provider's instructions on monitoring your cholesterol and blood pressure. This information is not intended to replace advice given to you by your health care provider. Make sure you discuss any questions you have with your health care provider. Document Revised: 04/14/2021 Document Reviewed: 04/14/2021 Elsevier Patient Education  2023 [employer]  

## 2023-01-28 NOTE — Progress Notes (Signed)
Subjective:   [name] is a 89 y.o. male who presents for Medicare Annual/Subsequent preventive examination.  Review of Systems    I connected with  [name] on 01/28/23 by a audio enabled telemedicine application and verified that I am speaking with the correct person using two identifiers.  Patient Location: Home  Provider Location: Office/Clinic  I discussed the limitations of evaluation and management by telemedicine. The patient expressed understanding and agreed to proceed.        Objective:    There were no vitals filed for this visit. There is no height or weight on file to calculate BMI.     12/15/2022    4:12 PM 09/20/2022   11:37 AM 03/04/2022    2:33 PM 10/01/2020    1:21 PM 09/26/2018    1:45 PM 09/14/2018   12:58 PM 05/27/2018   10:26 AM  Advanced Directives  Does Patient Have a Medical Advance Directive? Yes No No No No No No  Type of Paramedic of [place];Living will        Does patient want to make changes to medical advance directive? No - Guardian declined        Copy of [place] in Chart? No - copy requested        Would patient like information on creating a medical advance directive?  No - Patient declined Yes (MAU/Ambulatory/Procedural Areas - Information given) No - Patient declined Yes (ED - Information included in AVS)  No - Patient declined    Current Medications (verified) Outpatient Encounter Medications as of 01/28/2023  Medication Sig   amLODipine (NORVASC) 10 MG tablet TAKE 1 TABLET DAILY   benzonatate (TESSALON) 100 MG capsule Take one capsule once daily, as needed, for congestion   calcium carbonate (TUMS - DOSED IN MG ELEMENTAL CALCIUM) 500 MG chewable tablet Chew 0.5 tablets by mouth 2 (two) times daily.   clopidogrel (PLAVIX) 75 MG tablet TAKE 1 TABLET DAILY   collagenase (SANTYL) 250 UNIT/GM ointment Apply 1 Application topically daily.   diphenoxylate-atropine (LOMOTIL) 2.5-0.025  MG tablet Take one tablet once daily as needed, for diarrheah   donepezil (ARICEPT) 5 MG tablet Take 5 mg by mouth daily.   famotidine (PEPCID) 20 MG tablet Take 20 mg by mouth 2 (two) times daily.   hydrALAZINE (APRESOLINE) 25 MG tablet Take 25 mg by mouth in the morning and at bedtime.   hydrOXYzine (ATARAX) 10 MG tablet Hydroxyzine take half to one tablet daily, as needed, for agitation   levETIRAcetam (KEPPRA) 100 MG/ML solution Take 5 mLs (500 mg total) by mouth daily.   levothyroxine (SYNTHROID) 112 MCG tablet TAKE 1 TABLET DAILY BEFORE BREAKFAST   montelukast (SINGULAIR) 10 MG tablet Take 1 tablet (10 mg total) by mouth at bedtime.   Multiple Vitamins-Minerals (MULTIVITAMIN WITH MINERALS) tablet Take 1 tablet by mouth daily.   Nystatin (GERHARDT'S BUTT CREAM) CREA Apply sparingly twice daily to affected area   olopatadine (PATANOL) 0.1 % ophthalmic solution INSTILL 1 DROP INTO EACH EYE TWICE DAILY   ondansetron (ZOFRAN-ODT) 8 MG disintegrating tablet Take 8 mg by mouth every 8 (eight) hours as needed.   polyethylene glycol powder (GLYCOLAX/MIRALAX) 17 GM/SCOOP powder Take 17 g by mouth daily. (Patient taking differently: Take 17 g by mouth daily as needed.)   pravastatin (PRAVACHOL) 20 MG tablet TAKE 1 TABLET DAILY   sodium bicarbonate 650 MG tablet Take 650 mg by mouth 3 (three) times daily.  tamsulosin (FLOMAX) 0.4 MG CAPS capsule Take 1 capsule (0.4 mg total) by mouth daily.   UNABLE TO FIND Med Name: Patient needs assistance with transferring and ADLS and needs an in home care nurse. Patient is unable to stand on his own or help stand.   No facility-administered encounter medications on file as of 01/28/2023.    Allergies (verified) Bayer advanced aspirin [aspirin] and Penicillins   History: Past Medical History:  Diagnosis Date   Bradycardia 03/15/2012   Carotid artery occlusion    Choledocholithiasis 02/25/2018   CKD (chronic kidney disease) stage 4, GFR 15-29 ml/min (HCC)     Complete lesion of cervical spinal cord ([place]) 03/14/3012   Stable since 2006   CVA (cerebrovascular accident) ([place]) 09/07/12   right sided weakness   Depressive disorder, not elsewhere classified    Diabetes mellitus approx 1994   Diabetic neuropathy ([place])    History of kidney stones    Hypertensive heart disease    Hypothyroidism approx 2000   [place]unar stroke, acute ([place]) 03/14/2012   Obesity    Osteoarthrosis, unspecified whether generalized or localized, lower leg    Other and unspecified hyperlipidemia    Peripheral vascular disease, unspecified ([place])    Seizures ([place])    unknown etiology; on meds, last seizure was 2015   Spinal stenosis, unspecified region other than cervical    Spondylosis of unspecified site without mention of myelopathy    Past Surgical History:  Procedure Laterality Date   BILIARY STENT PLACEMENT N/A 05/27/2018   Procedure: BILIARY STENT PLACEMENT;  Surgeon: Rogene Houston, MD;  [facility];  Service: Endoscopy;  Laterality: N/A;   COLONOSCOPY N/A 08/10/2013   Procedure: COLONOSCOPY;  Surgeon: Rogene Houston, MD;  [facility];  Service: Endoscopy;  Laterality: N/A;  240   ERCP N/A 03/02/2018   Procedure: ENDOSCOPIC RETROGRADE CHOLANGIOPANCREATOGRAPHY (ERCP) With sphincterotomy and stent placement;  Surgeon: Rogene Houston, MD;  [facility];  Service: Gastroenterology;  Laterality: N/A;   ERCP N/A 05/27/2018   Procedure: ENDOSCOPIC RETROGRADE CHOLANGIOPANCREATOGRAPHY (ERCP);  Surgeon: Rogene Houston, MD;  [facility];  Service: Endoscopy;  Laterality: N/A;   ERCP N/A 09/16/2018   Procedure: ENDOSCOPIC RETROGRADE CHOLANGIOPANCREATOGRAPHY (ERCP);  Surgeon: Rogene Houston, MD;  [facility];  Service: Endoscopy;  Laterality: N/A;   GASTROINTESTINAL STENT REMOVAL N/A 05/27/2018   Procedure: Biliary STENT REMOVAL;  Surgeon: Rogene Houston, MD;  [facility];  Service: Endoscopy;  Laterality: N/A;    GASTROINTESTINAL STENT REMOVAL N/A 09/16/2018   Procedure: GASTROINTESTINAL STENT REMOVAL;  Surgeon: Rogene Houston, MD;  [facility];  Service: Endoscopy;  Laterality: N/A;   kidney stones left x2  1975   KIDNEY SURGERY     Ruptured left kidney 30 yrs ago  from a kidney stone   LITHOTRIPSY N/A 05/27/2018   Procedure: MECHANICAL LITHOTRIPSY;  Surgeon: Rogene Houston, MD;  [facility];  Service: Endoscopy;  Laterality: N/A;   REMOVAL OF STONES N/A 09/16/2018   Procedure: REMOVAL OF MULTIPLE STONES WITH BASKET AND BALLOON;  Surgeon: Rogene Houston, MD;  [facility];  Service: Endoscopy;  Laterality: N/A;   SPHINCTEROTOMY N/A 05/27/2018   Procedure: SPHINCTEROTOMY extended;  Surgeon: Rogene Houston, MD;  [facility];  Service: Endoscopy;  Laterality: N/A;   SPYGLASS CHOLANGIOSCOPY N/A 05/27/2018   Procedure: SPYGLASS CHOLANGIOSCOPY;  Surgeon: Rogene Houston, MD;  [facility];  Service: Endoscopy;  Laterality: N/A;   Family History  Problem Relation Age of Onset   Diabetes Mother    Prostate cancer Father    Hypertension Brother    Hypertension Brother    Diabetes Brother    Stroke Brother    Diabetes Brother    Diabetes Daughter    Diabetes Daughter    Social History   Socioeconomic History   Marital status: Married    Spouse name: Elson Tweedle   Number of children: 5   Years of education: 8   Highest education level: 8th grade  Occupational History   Occupation: retired     Fish farm manager: RETIRED  Tobacco Use   Smoking status: Never    Passive exposure: Never   Smokeless tobacco: Never   Tobacco comments:    Verified by Daughter, [name].  Vaping Use   Vaping Use: Never used  Substance and Sexual Activity   Alcohol use: No    Alcohol/week: 0.0 standard drinks of alcohol   Drug use: No   Sexual activity: Not Currently    Partners: Female  Other Topics Concern   Not on file  Social History Narrative    Not on file   Social Determinants of Health   Financial Resource Strain: Low Risk  (12/15/2022)   Overall Financial Resource Strain (CARDIA)    Difficulty of Paying Living Expenses: Not hard at all  Food Insecurity: No Food Insecurity (12/15/2022)   Hunger Vital Sign    Worried About Running Out of Food in the Last Year: Never true    Ran Out of Food in the Last Year: Never true  Transportation Needs: No Transportation Needs (12/15/2022)   PRAPARE - Hydrologist (Medical): No    Lack of Transportation (Non-Medical): No  Physical Activity: Inactive (12/15/2022)   Exercise Vital Sign    Days of Exercise per Week: 0 days    Minutes of Exercise per Session: 0 min  Stress: No Stress Concern Present (12/15/2022)   [place]    Feeling of Stress : Not at all  Social Connections: Moderately Isolated (12/15/2022)   Social Connection and Isolation Panel [NHANES]    Frequency of Communication with Friends and Family: More than three times a week    Frequency of Social Gatherings with Friends and Family: More than three times a week    Attends Religious Services: Never    Marine scientist or Organizations: No    Attends Archivist Meetings: Never    Marital Status: Married    Tobacco Counseling Counseling given: Not Answered Tobacco comments: Verified by Daughter, [name].   Clinical Intake:                 Diabetic?Yes Nutrition Risk Assessment:  Has the patient had any N/V/D within the last 2 months?  No  Does the patient have any non-healing wounds?  No  Has the patient had any unintentional weight loss or weight gain?  No   Diabetes:  Is the patient diabetic?  Yes  If diabetic, was a CBG obtained today?  Yes Did the patient bring in their glucometer from home?  No  How often do you monitor your CBG's? Twice A DAY.   Financial Strains and Diabetes  Management:  Are you having any financial strains with the device, your supplies or your medication? No .  Does the patient want to  be seen by Chronic Care Management for management of their diabetes?  No  Would the patient like to be referred to a Nutritionist or for Diabetic Management?  No   Diabetic Exams:  Diabetic Eye Exam: Completed 05/06/2017. Overdue for diabetic eye exam. Pt has been advised about the importance in completing this exam. A referral has been placed today. Message sent to referral coordinator for scheduling purposes. Advised pt to expect a call from office referred to regarding appt.  Diabetic Foot Exam: Completed 06/28/2020. Pt has been advised about the importance in completing this exam. Pt is scheduled for diabetic foot exam on 06/28/2021.           Activities of Daily Living    12/15/2022    4:10 PM 09/30/2022   12:49 PM  In your present state of health, do you have any difficulty performing the following activities:  Hearing? 0 0  Vision? 0 0  Difficulty concentrating or making decisions? 1 1  Comment Patient Has Dementia dementia  Walking or climbing stairs? 1 1  Comment Non-Ambulatory family assists  Dressing or bathing? 1 1  Comment Family Assists family assists  Doing errands, shopping? 1 1  Comment  family assists  Preparing Food and eating ? Y Y  Comment Family Assists family assists  Using the Toilet? Y Y  Comment Family Assists family assists  In the past six months, have you accidently leaked urine? Y Y  Comment Family Assists wears incontinence briefs  Do you have problems with loss of bowel control? Tempie Donning  Comment Family Assists   Managing your Medications? Y Y  Comment Family Assists family assists  Managing your Finances? Y Y  Comment Family Assists family assists  Housekeeping or managing your Housekeeping? Tempie Donning  Comment Family Assists family assists    Patient Care Team: [name], MD as PCP - General Branch, Alphonse Guild, MD as PCP - Cardiology (Cardiology) Fran Lowes, MD (Inactive) as Consulting Physician (Nephrology) Phillips Odor, MD as Consulting Physician (Neurology) Cassandria Anger, MD as Consulting Physician (Endocrinology) Georganna Skeans, MD as Consulting Physician (General Surgery) Beryle Lathe, [facility] (Inactive) (Pharmacist) Kassie Mends, RN as [place] any recent [place] you may have received from other than [facility] providers in the past year (date may be approximate).     Assessment:   This is a routine wellness examination for Chase [name].  Hearing/Vision screen No results found.  Dietary issues and exercise activities discussed:     Goals Addressed   None    Depression Screen    12/15/2022    3:41 PM 09/19/2022   11:41 AM 09/22/2022   11:39 AM 05/06/2022    2:48 PM 03/12/2022    1:22 PM 03/04/2022    2:28 PM 01/22/2022    1:18 PM  PHQ 2/9 Scores  PHQ - 2 Score 0   0 0  0  Exception Documentation Other- indicate reason in comment box Medical reason Medical reason   Medical reason   Not completed Verified by Daughter, Andan Pal          Fall Risk    12/25/2022    2:33 PM 12/15/2022    4:10 PM 12/15/2022    4:08 PM 08/06/2022    2:41 PM 05/06/2022    2:48 PM  [place] in the past year? 0  1 Exclusion - non ambulatory 0  Comment  Verified by Daughter, Adela Lank  [place]     Number falls in past yr: 0  1  0  Injury with Fall? 0  1  0  Risk for fall due to : Impaired balance/gait;No Fall Risks  History of fall(s);Impaired balance/gait;Impaired mobility;Mental status change  No Fall Risks  Follow up Falls evaluation completed  Falls prevention discussed;Education provided;Falls evaluation completed  Falls evaluation completed    FALL RISK PREVENTION PERTAINING TO THE HOME:  Any stairs in or around the home? Yes  If so, are there any without handrails? Yes  Home free of loose throw rugs in walkways, pet  beds, electrical cords, etc? Yes  Adequate lighting in your home to reduce risk of falls? Yes   ASSISTIVE DEVICES UTILIZED TO PREVENT FALLS:  Life alert? No  Use of a cane, walker or w/c? Yes  Grab bars in the bathroom? Yes  Shower chair or bench in shower? Yes  Elevated toilet seat or a handicapped toilet? Yes   TIMED UP AND GO:  Was the test performed? No .  Length of time to ambulate 10 feet:  sec.     Cognitive Function:    10/06/2017    2:40 PM  MMSE - Mini Mental State Exam  Orientation to time 1  Orientation to Place 5  Registration 3  Attention/ Calculation 4  Recall 1  Language- name 2 objects 2  Language- repeat 0  Language- follow 3 step command 3  Language- read & follow direction 1  Write a sentence 1  Copy design 1  Total score 22        01/22/2022    1:31 PM 10/01/2020    1:23 PM 09/28/2019    1:11 PM 09/26/2018    1:49 PM  6CIT Screen  What Year? 4 points 0 points 0 points 4 points  What month? 3 points 3 points 0 points 0 points  What time? 0 points 3 points 0 points 3 points  Count back from 20 4 points 0 points 0 points 0 points  Months in reverse 4 points 4 points 0 points 4 points  Repeat phrase 10 points 10 points 0 points 2 points  Total Score 25 points 20 points 0 points 13 points    Immunizations Immunization History  Administered Date(s) Administered   Fluad Quad(high Dose 65+) 08/07/2019, 10/07/2020   H1N1 11/14/2008   Influenza Split 10/07/2011, 09/08/2012   Influenza Whole 08/22/2007, 08/27/2010   Influenza,inj,Quad PF,6+ Mos 08/22/2013, 09/26/2014, 09/26/2015, 09/01/2016, 09/20/2017, 09/26/2018   Influenza-Unspecified 09/25/2021   Moderna Sars-Covid-2 Vaccination 01/01/2020, 02/01/2020, 01/05/2021, 06/02/2021   Pneumococcal Conjugate-13 06/12/2014   Pneumococcal Polysaccharide-23 05/21/2004   Td 05/21/2004   Tdap 10/07/2011    TDAP status: Due, Education has been provided regarding the importance of this vaccine.  Advised may receive this vaccine at local pharmacy or Health Dept. Aware to provide a copy of the vaccination record if obtained from local pharmacy or Health Dept. Verbalized acceptance and understanding.  Flu Vaccine status: Due, Education has been provided regarding the importance of this vaccine. Advised may receive this vaccine at local pharmacy or Health Dept. Aware to provide a copy of the vaccination record if obtained from local pharmacy or Health Dept. Verbalized acceptance and understanding.  Pneumococcal vaccine status: Up to date  Covid-19 vaccine status: Completed vaccines  Qualifies for Shingles Vaccine? Yes   Zostavax completed No   Shingrix Completed?: Yes  Screening Tests Health Maintenance  Topic Date Due   Zoster Vaccines- Shingrix (1 of 2) Never  done   OPHTHALMOLOGY EXAM  05/06/2018   FOOT EXAM  06/28/2021   DTaP/Tdap/Td (3 - Td or Tdap) 10/06/2021   INFLUENZA VACCINE  07/07/2022   COVID-19 Vaccine (5 - 2023-24 season) 08/07/2022   Medicare Annual Wellness (AWV)  01/22/2023   HEMOGLOBIN A1C  03/09/2023   Pneumonia Vaccine 31+ Years old  Completed   HPV VACCINES  Aged Out    Health Maintenance  Health Maintenance Due  Topic Date Due   Zoster Vaccines- Shingrix (1 of 2) Never done   OPHTHALMOLOGY EXAM  05/06/2018   FOOT EXAM  06/28/2021   DTaP/Tdap/Td (3 - Td or Tdap) 10/06/2021   INFLUENZA VACCINE  07/07/2022   COVID-19 Vaccine (5 - 2023-24 season) 08/07/2022   Medicare Annual Wellness (AWV)  01/22/2023    Colorectal cancer screening: No longer required.   Lung Cancer Screening: (Low Dose CT Chest recommended if Age 94-80 years, 30 pack-year currently smoking OR have quit w/in 15years.) does not qualify.   Lung Cancer Screening Referral:   Additional Screening:  Hepatitis C Screening: does not qualify; Completed   Vision Screening: Recommended annual ophthalmology exams for early detection of glaucoma and other disorders of the eye. Is the  patient up to date with their annual eye exam?  No  Who is the provider or what is the name of the office in which the patient attends annual eye exams? N/A If pt is not established with a provider, would they like to be referred to a provider to establish care? No .   Dental Screening: Recommended annual dental exams for proper oral hygiene  Community Resource Referral / Chronic Care Management: CRR required this visit?  No   CCM required this visit?  No      Plan:     I have personally reviewed and noted the following in the patient's chart:   Medical and social history Use of alcohol, tobacco or illicit drugs  Current medications and supplements including opioid prescriptions. Patient is not currently taking opioid prescriptions. Functional ability and status Nutritional status Physical activity Advanced directives List of other physicians Hospitalizations, surgeries, and ER visits in previous 12 months Vitals Screenings to include cognitive, depression, and falls Referrals and appointments  In addition, I have reviewed and discussed with patient certain preventive protocols, quality metrics, and best practice recommendations. A written personalized care plan for preventive services as well as general preventive health recommendations were provided to patient.     Orson Ape, [place]   01/28/2023   Nurse Notes:  Mr. Mazzaferro , Thank you for taking time to come for your Medicare Wellness Visit. I appreciate your ongoing commitment to your health goals. Please review the following plan we discussed and let me know if I can assist you in the future.   These are the goals we discussed:  Goals      CCM (CHRONIC KIDNEY DISEASE) EXPECTED OUTCOME: MONITOR, SELF-MANAGE AND REDUCE SYMPTOMS OF CHRONIC KIDNEY DISEASE     Current Barriers:  Knowledge Deficits related to CKD stage 4 Chronic Disease Management support and education needs related to CKD stage 4 Cognitive Deficits No  Advanced Directives in place- family declines  Planned Interventions: Evaluation of current treatment plan related to chronic kidney disease self management and patient's adherence to plan as established by provider      Reviewed prescribed diet low sodium Reviewed medications with patient and discussed importance of compliance    Discussed complications of poorly controlled blood pressure such as  heart disease, stroke, circulatory complications, vision complications, kidney impairment, sexual dysfunction    Reviewed scheduled/upcoming provider appointments including    Advised patient to discuss change in health status with provider    Screening for signs and symptoms of depression related to chronic disease state      Reviewed importance of eating small portions of protein foods Reviewed choosing water to drink and offering to pt throughout the day Reinforced importance of changing positions q 2 hours  Symptom Management: Take medications as prescribed   Attend all scheduled provider appointments Call pharmacy for medication refills 3-7 days in advance of running out of medications Call provider office for new concerns or questions  Choose water as main beverage Offer food and beverage to patient throughout the day Read labels for sodium content/ avoid salty snacks and fast food Eat small portions of protein foods Change patient's positions q 2 hours  Follow Up Plan: Telephone follow up appointment with care management team member scheduled for:  02/15/23 at 215 pm       CCM (DIABETES) EXPECTED OUTCOME:  MONITOR, SELF-MANAGE AND REDUCE SYMPTOMS OF DIABETES     Current Barriers:  Knowledge Deficits related to Diabetes Chronic Disease Management support and education needs related to Diabetes and diet Cognitive Deficits No Advanced Directives in place- family declines Per spouse, CBG fasting ranges are 86-120,  uses Freestyle Libre to monitor, reports pt has all medications and  taking as prescribed Spouse reports there is someone there every evening to help get pt in the bed, reports they now have assistance from home health (through patient's insurance benefit) 2 days per week and states "this is very helpful" Spouse reports patient has pressure ulcer to right foot and buttock and states caregiver changes dressing at night, spouse states she does not know if the ulcers are better or worse  Planned Interventions: Provided education to patient about basic DM disease process; Reviewed medications with patient and discussed importance of medication adherence;        Reviewed prescribed diet with patient carbohydrate modified; Counseled on importance of regular laboratory monitoring as prescribed;        call provider for findings outside established parameters;       Review of patient status, including review of consultants reports, relevant laboratory and other test results, and medications completed;       Reviewed importance of good blood sugar control for wound healing Reviewed that wound or infection can elevate blood sugar Reviewed signs/ symptoms of infection  Symptom Management: Take medications as prescribed   Attend all scheduled provider appointments Call pharmacy for medication refills 3-7 days in advance of running out of medications Call provider office for new concerns or questions  check blood sugar at prescribed times: Freestyle Libre  check feet daily for cuts, sores or redness take the blood sugar log to all doctor visits take the blood sugar meter to all doctor visits trim toenails straight across fill half of plate with vegetables limit fast food meals to no more than 1 per week prepare main meal at home 3 to 5 days each week read food labels for fat, fiber, carbohydrates and portion size Report any new symptoms/ change in health status to your doctor  Report any worsening of wounds to your doctor  Follow Up Plan: Telephone follow up  appointment with care management team member scheduled for:  02/15/23 at 215 pm       CCM (HYPERTENSION) EXPECTED OUTCOME: MONITOR, SELF-MANAGE AND REDUCE SYMPTOMS  OF HYPERTENSION     Current Barriers:  Knowledge Deficits related to Hypertension Chronic Disease Management support and education needs related to Hypertension, diet Cognitive Deficits No Advanced Directives in place- family declines Spoke with patient's wife Coburn Lenger, patient's adult daughters are very active in the care of pt, pt has aide that comes in twice daily, morning and afternoon and assists with ADL's for the patient, now has assistance 2 days per week through patient's insurance benefit, pt has WC, walker, cane, patient has dementia and is on aricept, is now hydroxyzine prn for agitation, family does not monitor blood pressure and states palliative nurse no longer sees pt (per family request), pt has Stage 1 ulcer on lower buttock area and right foot, caregiver has been dressing this area, family states it is very difficult to get patient to change positions q 2 hours, there is now a caregiver who comes in at night to get pt into bed and this is working out well.    Planned Interventions: Evaluation of current treatment plan related to hypertension self management and patient's adherence to plan as established by provider;   Reviewed prescribed diet low sodium (pt has written diet information)  Reviewed medications with patient and discussed importance of compliance;  Discussed plans with patient for ongoing care management follow up and provided patient with direct contact information for care management team; Advised patient, providing education and rationale, to monitor blood pressure daily and record, calling PCP for findings outside established parameters;  Advised patient to discuss worsening would status with provider; Discussed complications of poorly controlled blood pressure such as heart disease, stroke,  circulatory complications, vision complications, kidney impairment, sexual dysfunction;  Reviewed importance of changing positions q 2 hours Reviewed importance of good nutrition Reviewed signs/ symptoms of infection  Symptom Management: Take medications as prescribed   Attend all scheduled provider appointments Call pharmacy for medication refills 3-7 days in advance of running out of medications Perform all self care activities independently  Call provider office for new concerns or questions  check blood pressure weekly choose a place to take my blood pressure (home, clinic or office, retail store) write blood pressure results in a log or diary keep a blood pressure log take blood pressure log to all doctor appointments keep all doctor appointments take medications for blood pressure exactly as prescribed report new symptoms to your doctor eat more whole grains, fruits and vegetables, lean meats and healthy fats Follow low sodium diet- read labels Change positions every 2 hours- this is very important Please call doctor if wound to bottom looks worse (redness, increased drainage, odor, fever)  Follow Up Plan: Telephone follow up appointment with care management team member scheduled for: 02/15/23 at 215 pm       DIET - INCREASE LEAN PROTEINS     Exercise 3x per week (30 min per time)     Recommend starting a routine exercise program at least 3 days a week for 30-45 minutes at a time as tolerated.      Glycemic Management Optimized     Evidence-based guidance:  Anticipate A1C testing (point-of-care) every 3 to 6 months based on goal attainment.  Review mutually-set A1C goal or target range.  Anticipate screening for thyroid dysfunction, adrenal dysfunction and celiac disease based on presenting signs/symptoms.  Anticipate use of insulin, multidose or continuous subcutaneous infusion with periodic adjustments; consider active involvement of pharmacist.  Explore child and  caregiver fear of hypoglycemic episodes that may impact adherence to  treatment plan, such as withholding insulin and allowing blood sugars to be ?oa little? high.  Provide medical nutrition therapy and development of individualized eating plan.  Compare child or caregiver reported symptoms of hypo or hyperglycemia to blood glucose levels, diet and fluid intake, current medications, psychosocial and physiologic stressors, change in activity and barriers to care adherence.  Promote self-monitoring of blood glucose levels, interval or continuous.  Assess and address barriers regarding adherence to treatment and self-management plan, including patient factors, such as family cohesion, age, developmental ability, depression, anxiety, fear of hypoglycemia or weight gain, as   well as medication cost, side effects and complicated regimen.  Encourage developmentally-appropriate balance between dependence and independence in care, especially when intensifying treatment, such as continuous blood glucose monitoring or the use of an insulin pump.  Consider referral to community-based diabetes education program, visiting nurse, community health worker or health coach.  Initiate or review diabetes medical management plan for school that may include storage of insulin and clean, private location for insulin administration or blood glucose monitoring.   Notes: Keeping sugar maintained.      Increase physical activity     Medication Management     Patient Goals/Self-Care Activities Patient will:  Take medications as prescribed Check blood sugar twice a day at the following times: fasting (at least 8 hours since last food consumption), 5-15 minutes before dinner, and whenever patient experiences symptoms of hypo/hyperglycemia, document, and provide at future appointments Check blood pressure at least once daily, document, and provide at future appointments Engage in dietary modifications by less frequent dining  out, decreased fat intake, and fewer sweetened foods & beverages        Patient Stated     I want to eat more and move around more        This is a list of the screening recommended for you and due dates:  Health Maintenance  Topic Date Due   Zoster (Shingles) Vaccine (1 of 2) Never done   Eye exam for diabetics  05/06/2018   Complete foot exam   06/28/2021   DTaP/Tdap/Td vaccine (3 - Td or Tdap) 10/06/2021   Flu Shot  07/07/2022   COVID-19 Vaccine (5 - 2023-24 season) 08/07/2022   Hemoglobin A1C  03/09/2023   Medicare Annual Wellness Visit  01/29/2024   Pneumonia Vaccine  Completed   HPV Vaccine  Aged Out

## 2023-01-29 DIAGNOSIS — F01B11 Vascular dementia, moderate, with agitation: Secondary | ICD-10-CM | POA: Diagnosis not present

## 2023-01-29 DIAGNOSIS — E1121 Type 2 diabetes mellitus with diabetic nephropathy: Secondary | ICD-10-CM | POA: Diagnosis not present

## 2023-01-29 DIAGNOSIS — L89152 Pressure ulcer of sacral region, stage 2: Secondary | ICD-10-CM | POA: Diagnosis not present

## 2023-01-29 DIAGNOSIS — L89611 Pressure ulcer of right heel, stage 1: Secondary | ICD-10-CM | POA: Diagnosis not present

## 2023-01-29 DIAGNOSIS — Z515 Encounter for palliative care: Secondary | ICD-10-CM | POA: Diagnosis not present

## 2023-01-29 DIAGNOSIS — G40209 Localization-related (focal) (partial) symptomatic epilepsy and epileptic syndromes with complex partial seizures, not intractable, without status epilepticus: Secondary | ICD-10-CM | POA: Diagnosis not present

## 2023-01-29 DIAGNOSIS — I150 Renovascular hypertension: Secondary | ICD-10-CM | POA: Diagnosis not present

## 2023-01-29 DIAGNOSIS — I509 Heart failure, unspecified: Secondary | ICD-10-CM | POA: Diagnosis not present

## 2023-01-29 DIAGNOSIS — L89322 Pressure ulcer of left buttock, stage 2: Secondary | ICD-10-CM | POA: Diagnosis not present

## 2023-01-29 MED ORDER — HYDROXYZINE HCL 10 MG PO TABS: ORAL_TABLET | ORAL | 1 refills | Status: DC

## 2023-01-29 NOTE — Addendum Note (Signed)
Addended by: [name] on: 01/29/2023 06:19 PM   Modules accepted: Orders

## 2023-01-29 NOTE — Telephone Encounter (Signed)
Updated direction on hydroxyzine for patient is 10 mg half twice daily, morning and afternoon and half at bedtime , as needed,I will change in rx , just an FYI

## 2023-02-02 DIAGNOSIS — L89611 Pressure ulcer of right heel, stage 1: Secondary | ICD-10-CM | POA: Diagnosis not present

## 2023-02-02 DIAGNOSIS — I150 Renovascular hypertension: Secondary | ICD-10-CM | POA: Diagnosis not present

## 2023-02-02 DIAGNOSIS — G40209 Localization-related (focal) (partial) symptomatic epilepsy and epileptic syndromes with complex partial seizures, not intractable, without status epilepticus: Secondary | ICD-10-CM | POA: Diagnosis not present

## 2023-02-02 DIAGNOSIS — L89152 Pressure ulcer of sacral region, stage 2: Secondary | ICD-10-CM | POA: Diagnosis not present

## 2023-02-02 DIAGNOSIS — I509 Heart failure, unspecified: Secondary | ICD-10-CM | POA: Diagnosis not present

## 2023-02-02 DIAGNOSIS — E1121 Type 2 diabetes mellitus with diabetic nephropathy: Secondary | ICD-10-CM | POA: Diagnosis not present

## 2023-02-03 ENCOUNTER — Telehealth: Payer: Self-pay

## 2023-02-03 ENCOUNTER — Other Ambulatory Visit: Payer: Self-pay

## 2023-02-03 MED ORDER — UNABLE TO FIND: 0 refills | Status: DC

## 2023-02-03 NOTE — Telephone Encounter (Signed)
Fannie cream sent to [place]. Was late seeing message because it was sent directly to me and not the clinical pool

## 2023-02-03 NOTE — Telephone Encounter (Signed)
Spoke with brandon at [facility] and he wanted to let you know Chase [name] did not want to use [facility] and also could not afford the compounded fannie cream in case you wanted to call in something different patients wife prefers pharmacy in [place]

## 2023-02-04 DIAGNOSIS — L89611 Pressure ulcer of right heel, stage 1: Secondary | ICD-10-CM | POA: Diagnosis not present

## 2023-02-04 DIAGNOSIS — I150 Renovascular hypertension: Secondary | ICD-10-CM | POA: Diagnosis not present

## 2023-02-04 DIAGNOSIS — G40209 Localization-related (focal) (partial) symptomatic epilepsy and epileptic syndromes with complex partial seizures, not intractable, without status epilepticus: Secondary | ICD-10-CM | POA: Diagnosis not present

## 2023-02-04 DIAGNOSIS — I509 Heart failure, unspecified: Secondary | ICD-10-CM | POA: Diagnosis not present

## 2023-02-04 DIAGNOSIS — L89152 Pressure ulcer of sacral region, stage 2: Secondary | ICD-10-CM | POA: Diagnosis not present

## 2023-02-04 DIAGNOSIS — E1121 Type 2 diabetes mellitus with diabetic nephropathy: Secondary | ICD-10-CM | POA: Diagnosis not present

## 2023-02-05 DIAGNOSIS — G40209 Localization-related (focal) (partial) symptomatic epilepsy and epileptic syndromes with complex partial seizures, not intractable, without status epilepticus: Secondary | ICD-10-CM | POA: Diagnosis not present

## 2023-02-05 DIAGNOSIS — L89611 Pressure ulcer of right heel, stage 1: Secondary | ICD-10-CM | POA: Diagnosis not present

## 2023-02-05 DIAGNOSIS — L89152 Pressure ulcer of sacral region, stage 2: Secondary | ICD-10-CM | POA: Diagnosis not present

## 2023-02-05 DIAGNOSIS — I150 Renovascular hypertension: Secondary | ICD-10-CM | POA: Diagnosis not present

## 2023-02-05 DIAGNOSIS — E1121 Type 2 diabetes mellitus with diabetic nephropathy: Secondary | ICD-10-CM | POA: Diagnosis not present

## 2023-02-05 DIAGNOSIS — I509 Heart failure, unspecified: Secondary | ICD-10-CM | POA: Diagnosis not present

## 2023-02-09 DIAGNOSIS — L89152 Pressure ulcer of sacral region, stage 2: Secondary | ICD-10-CM | POA: Diagnosis not present

## 2023-02-09 DIAGNOSIS — I150 Renovascular hypertension: Secondary | ICD-10-CM | POA: Diagnosis not present

## 2023-02-09 DIAGNOSIS — L89611 Pressure ulcer of right heel, stage 1: Secondary | ICD-10-CM | POA: Diagnosis not present

## 2023-02-09 DIAGNOSIS — I509 Heart failure, unspecified: Secondary | ICD-10-CM | POA: Diagnosis not present

## 2023-02-09 DIAGNOSIS — G40209 Localization-related (focal) (partial) symptomatic epilepsy and epileptic syndromes with complex partial seizures, not intractable, without status epilepticus: Secondary | ICD-10-CM | POA: Diagnosis not present

## 2023-02-09 DIAGNOSIS — E1121 Type 2 diabetes mellitus with diabetic nephropathy: Secondary | ICD-10-CM | POA: Diagnosis not present

## 2023-02-10 DIAGNOSIS — I150 Renovascular hypertension: Secondary | ICD-10-CM | POA: Diagnosis not present

## 2023-02-10 DIAGNOSIS — G40209 Localization-related (focal) (partial) symptomatic epilepsy and epileptic syndromes with complex partial seizures, not intractable, without status epilepticus: Secondary | ICD-10-CM | POA: Diagnosis not present

## 2023-02-10 DIAGNOSIS — L89152 Pressure ulcer of sacral region, stage 2: Secondary | ICD-10-CM | POA: Diagnosis not present

## 2023-02-10 DIAGNOSIS — E1121 Type 2 diabetes mellitus with diabetic nephropathy: Secondary | ICD-10-CM | POA: Diagnosis not present

## 2023-02-10 DIAGNOSIS — L89611 Pressure ulcer of right heel, stage 1: Secondary | ICD-10-CM | POA: Diagnosis not present

## 2023-02-10 DIAGNOSIS — I509 Heart failure, unspecified: Secondary | ICD-10-CM | POA: Diagnosis not present

## 2023-02-10 NOTE — Telephone Encounter (Signed)
Patient wife aware

## 2023-02-12 DIAGNOSIS — N184 Chronic kidney disease, stage 4 (severe): Secondary | ICD-10-CM | POA: Diagnosis not present

## 2023-02-12 DIAGNOSIS — G40209 Localization-related (focal) (partial) symptomatic epilepsy and epileptic syndromes with complex partial seizures, not intractable, without status epilepticus: Secondary | ICD-10-CM | POA: Diagnosis not present

## 2023-02-12 DIAGNOSIS — L89152 Pressure ulcer of sacral region, stage 2: Secondary | ICD-10-CM | POA: Diagnosis not present

## 2023-02-12 DIAGNOSIS — D631 Anemia in chronic kidney disease: Secondary | ICD-10-CM | POA: Diagnosis not present

## 2023-02-12 DIAGNOSIS — I509 Heart failure, unspecified: Secondary | ICD-10-CM | POA: Diagnosis not present

## 2023-02-12 DIAGNOSIS — L89611 Pressure ulcer of right heel, stage 1: Secondary | ICD-10-CM | POA: Diagnosis not present

## 2023-02-12 DIAGNOSIS — I150 Renovascular hypertension: Secondary | ICD-10-CM | POA: Diagnosis not present

## 2023-02-12 DIAGNOSIS — E1121 Type 2 diabetes mellitus with diabetic nephropathy: Secondary | ICD-10-CM | POA: Diagnosis not present

## 2023-02-15 ENCOUNTER — Ambulatory Visit (INDEPENDENT_AMBULATORY_CARE_PROVIDER_SITE_OTHER): Payer: Medicare Other | Admitting: *Deleted

## 2023-02-15 DIAGNOSIS — I10 Essential (primary) hypertension: Secondary | ICD-10-CM

## 2023-02-15 DIAGNOSIS — E1121 Type 2 diabetes mellitus with diabetic nephropathy: Secondary | ICD-10-CM

## 2023-02-15 NOTE — Chronic Care Management (AMB) (Signed)
Chronic Care Management   CCM RN Visit Note  02/15/2023 Name: Chase [name] MRN: [id number] DOB: 11/08/1931  Subjective: Chase [name] is a 89 y.o. year old male who is a primary care patient of [name], MD. The patient was referred to the Chronic Care Management team for assistance with care management needs subsequent to provider initiation of CCM services and plan of care.    Today's Visit:  Engaged with patient by telephone for follow up visit.        Goals Addressed             This Visit's Progress    CCM (CHRONIC KIDNEY DISEASE) EXPECTED OUTCOME: MONITOR, SELF-MANAGE AND REDUCE SYMPTOMS OF CHRONIC KIDNEY DISEASE       Current Barriers:  Knowledge Deficits related to CKD stage 4 Chronic Disease Management support and education needs related to CKD stage 4 Cognitive Deficits No Advanced Directives in place- family declines Patient's spouse reports patient's appetite is not always good and sometimes he does not want to eat, pt continues to supplement with Boost and/ or Glucerna and does well with this, spouse wants to know is there anything (medication etc) that can be done to increase appetite  Planned Interventions: Evaluation of current treatment plan related to chronic kidney disease self management and patient's adherence to plan as established by provider      Reviewed prescribed diet low sodium Reviewed medications with patient and discussed importance of compliance    Discussed complications of poorly controlled blood pressure such as heart disease, stroke, circulatory complications, vision complications, kidney impairment, sexual dysfunction    Reviewed scheduled/upcoming provider appointments including    Advised patient to discuss change in health status/ appetite  with provider    Discussed the impact of chronic kidney disease on daily life and mental health and acknowledged and normalized feelings of disempowerment, fear, and frustration    Support  coping and stress management by recognizing current strategies and assist in developing new strategies such as mindfulness, journaling, relaxation techniques, problem-solving    Reinforced importance of eating small portions of protein foods Reinforced choosing water to drink and offering to pt throughout the day Reviewed importance of changing positions q 2 hours to prevent skin breakdown In basket message sent to primary care provider reporting pt has decreased appetite and ask if there is anything (medication, etc) that would be helpful  Symptom Management: Take medications as prescribed   Attend all scheduled provider appointments Call pharmacy for medication refills 3-7 days in advance of running out of medications Call provider office for new concerns or questions  Choose water as main beverage Offer food and beverage to patient throughout the day Read labels for sodium content/ avoid salty snacks and fast food Eat small portions of protein foods Change patient's positions q 2 hours Keep patient clean and dry Decreased appetite reported to primary care provider, please keep in touch with your primary care provider and report any changes in health status/ symptoms  Follow Up Plan: Telephone follow up appointment with care management team member scheduled for:   05/06/23 at 3 pm       CCM (DIABETES) EXPECTED OUTCOME:  MONITOR, SELF-MANAGE AND REDUCE SYMPTOMS OF DIABETES       Current Barriers:  Knowledge Deficits related to Diabetes Chronic Disease Management support and education needs related to Diabetes and diet Cognitive Deficits No Advanced Directives in place- family declines Per spouse, CBG fasting ranges are 80-120 with today's reading 112,  uses Freestyle Libre to monitor, reports pt has all medications and taking as prescribed Spouse reports there is someone there every evening to help get pt in the bed, reports they now have assistance in the home M-F 4 hours per day and  this has been very helpful Spouse reports wound to right foot is healed and "one on buttocks is just about healed" Spouse reports home health continues and provides oversight for wound care/ progress of wound healing  Planned Interventions: Reviewed medications with patient and discussed importance of medication adherence;        Counseled on importance of regular laboratory monitoring as prescribed;        Advised patient, providing education and rationale, to check cbg CGM  and record        call provider for findings outside established parameters;       Review of patient status, including review of consultants reports, relevant laboratory and other test results, and medications completed;       Advised patient to discuss any issues with blood sugar, medications, overall health with provider;      Reinforced importance of good blood sugar control for wound healing Reinforced  that wound or infection can elevate blood sugar Reinforced signs/ symptoms of infection Reinforced carbohydrate modified diet  Symptom Management: Take medications as prescribed   Attend all scheduled provider appointments Call pharmacy for medication refills 3-7 days in advance of running out of medications Call provider office for new concerns or questions  check blood sugar at prescribed times: Freestyle Libre  check feet daily for cuts, sores or redness take the blood sugar log to all doctor visits take the blood sugar meter to all doctor visits trim toenails straight across fill half of plate with vegetables limit fast food meals to no more than 1 per week prepare main meal at home 3 to 5 days each week read food labels for fat, fiber, carbohydrates and portion size set a realistic goal keep feet up while sitting Report any new symptoms/ change in health status to your doctor  Report any worsening of wounds to your doctor Continue working with home health for dressing changes, wound care   Follow Up  Plan: Telephone follow up appointment with care management team member scheduled for:    05/06/23 at 3 pm       CCM (HYPERTENSION) EXPECTED OUTCOME: MONITOR, SELF-MANAGE AND REDUCE SYMPTOMS OF HYPERTENSION       Current Barriers:  Knowledge Deficits related to Hypertension Chronic Disease Management support and education needs related to Hypertension, diet Cognitive Deficits No Advanced Directives in place- family declines Spoke with patient's wife Chase [name], patient's adult daughters are very active in the care of pt, pt has aide M-F 4 hours per day, pt has WC, walker, cane, patient has dementia and is on aricept, is now hydroxyzine prn for agitation, family does not monitor blood pressure and states palliative nurse no longer sees pt (per family request), pt has Stage 1 ulcer on lower buttock area and almost healed and right foot wound is completely healed per spouse, caregiver has been dressing this area, family states it is very difficult to get patient to change positions q 2 hours, there is now a caregiver who comes in at night to get pt into bed and this is working out well.    Planned Interventions: Evaluation of current treatment plan related to hypertension self management and patient's adherence to plan as established by provider;   Reviewed medications  with patient and discussed importance of compliance;  Discussed plans with patient for ongoing care management follow up and provided patient with direct contact information for care management team; Advised patient, providing education and rationale, to monitor blood pressure daily and record, calling PCP for findings outside established parameters;  Advised patient to discuss worsening would status with provider; Discussed complications of poorly controlled blood pressure such as heart disease, stroke, circulatory complications, vision complications, kidney impairment, sexual dysfunction;  Reinforced importance of changing positions q  2 hours Reinforced importance of good nutrition Reinforced signs/ symptoms of infection Reinforced low sodium diet  Symptom Management: Take medications as prescribed   Attend all scheduled provider appointments Call pharmacy for medication refills 3-7 days in advance of running out of medications Perform all self care activities independently  Call provider office for new concerns or questions  check blood pressure weekly choose a place to take my blood pressure (home, clinic or office, retail store) write blood pressure results in a log or diary learn about high blood pressure keep a blood pressure log take blood pressure log to all doctor appointments keep all doctor appointments take medications for blood pressure exactly as prescribed report new symptoms to your doctor eat more whole grains, fruits and vegetables, lean meats and healthy fats Follow low sodium diet- read labels Change positions every 2 hours- this is very important Please call doctor if wound to bottom looks worse (redness, increased drainage, odor, fever) Continue to work with home health Report new findings to your primary care provider  Follow Up Plan: Telephone follow up appointment with care management team member scheduled for:   05/06/23 at 3 pm          Plan:Telephone follow up appointment with care management team member scheduled for:  05/06/23 at 3 pm  Jacqlyn Larsen [facility], BSN RN Case Manager [facility] [phone]

## 2023-02-15 NOTE — Patient Instructions (Signed)
Please call the care guide team at [phone] if you need to cancel or reschedule your appointment.   If you are experiencing a Mental Health or [place] or need someone to talk to, please call the Suicide and Crisis Lifeline: 988 call the [place] National Suicide Prevention Lifeline: (814) 870-6779 or TTY: 3097103607 TTY 770-462-3668) to talk to a trained counselor call 1-800-273-TALK (toll free, 24 hour hotline) go to [facility] [address], [place] [phone]) call the [facility]: [phone] call 911   Following is a copy of the CCM Program Consent:  CCM service includes personalized support from designated clinical staff supervised by the physician, including individualized plan of care and coordination with other care providers 24/7 contact phone numbers for assistance for urgent and routine care needs. Service will only be billed when office clinical staff spend 20 minutes or more in a month to coordinate care. Only one practitioner may furnish and bill the service in a calendar month. The patient may stop CCM services at amy time (effective at the end of the month) by phone call to the office staff. The patient will be responsible for cost sharing (co-pay) or up to 20% of the service fee (after annual deductible is met)  Following is a copy of your full provider care plan:   Goals Addressed             This Visit's Progress    CCM (CHRONIC KIDNEY DISEASE) EXPECTED OUTCOME: MONITOR, SELF-MANAGE AND REDUCE SYMPTOMS OF CHRONIC KIDNEY DISEASE       Current Barriers:  Knowledge Deficits related to CKD stage 4 Chronic Disease Management support and education needs related to CKD stage 4 Cognitive Deficits No Advanced Directives in place- family declines Patient's spouse reports patient's appetite is not always good and sometimes he does not want to eat, pt continues to supplement with Boost and/ or  Glucerna and does well with this, spouse wants to know is there anything (medication etc) that can be done to increase appetite  Planned Interventions: Evaluation of current treatment plan related to chronic kidney disease self management and patient's adherence to plan as established by provider      Reviewed prescribed diet low sodium Reviewed medications with patient and discussed importance of compliance    Discussed complications of poorly controlled blood pressure such as heart disease, stroke, circulatory complications, vision complications, kidney impairment, sexual dysfunction    Reviewed scheduled/upcoming provider appointments including    Advised patient to discuss change in health status/ appetite  with provider    Discussed the impact of chronic kidney disease on daily life and mental health and acknowledged and normalized feelings of disempowerment, fear, and frustration    Support coping and stress management by recognizing current strategies and assist in developing new strategies such as mindfulness, journaling, relaxation techniques, problem-solving    Reinforced importance of eating small portions of protein foods Reinforced choosing water to drink and offering to pt throughout the day Reviewed importance of changing positions q 2 hours to prevent skin breakdown In basket message sent to primary care provider reporting pt has decreased appetite and ask if there is anything (medication, etc) that would be helpful  Symptom Management: Take medications as prescribed   Attend all scheduled provider appointments Call pharmacy for medication refills 3-7 days in advance of running out of medications Call provider office for new concerns or questions  Choose water as main beverage Offer food and beverage to patient  throughout the day Read labels for sodium content/ avoid salty snacks and fast food Eat small portions of protein foods Change patient's positions q 2 hours Keep  patient clean and dry Decreased appetite reported to primary care provider, please keep in touch with your primary care provider and report any changes in health status/ symptoms  Follow Up Plan: Telephone follow up appointment with care management team member scheduled for:   05/06/23 at 3 pm       CCM (DIABETES) EXPECTED OUTCOME:  MONITOR, SELF-MANAGE AND REDUCE SYMPTOMS OF DIABETES       Current Barriers:  Knowledge Deficits related to Diabetes Chronic Disease Management support and education needs related to Diabetes and diet Cognitive Deficits No Advanced Directives in place- family declines Per spouse, CBG fasting ranges are 80-120 with today's reading 112, uses Freestyle Libre to monitor, reports pt has all medications and taking as prescribed Spouse reports there is someone there every evening to help get pt in the bed, reports they now have assistance in the home M-F 4 hours per day and this has been very helpful Spouse reports wound to right foot is healed and "one on buttocks is just about healed" Spouse reports home health continues and provides oversight for wound care/ progress of wound healing  Planned Interventions: Reviewed medications with patient and discussed importance of medication adherence;        Counseled on importance of regular laboratory monitoring as prescribed;        Advised patient, providing education and rationale, to check cbg CGM  and record        call provider for findings outside established parameters;       Review of patient status, including review of consultants reports, relevant laboratory and other test results, and medications completed;       Advised patient to discuss any issues with blood sugar, medications, overall health with provider;      Reinforced importance of good blood sugar control for wound healing Reinforced  that wound or infection can elevate blood sugar Reinforced signs/ symptoms of infection Reinforced carbohydrate modified  diet  Symptom Management: Take medications as prescribed   Attend all scheduled provider appointments Call pharmacy for medication refills 3-7 days in advance of running out of medications Call provider office for new concerns or questions  check blood sugar at prescribed times: Freestyle Libre  check feet daily for cuts, sores or redness take the blood sugar log to all doctor visits take the blood sugar meter to all doctor visits trim toenails straight across fill half of plate with vegetables limit fast food meals to no more than 1 per week prepare main meal at home 3 to 5 days each week read food labels for fat, fiber, carbohydrates and portion size set a realistic goal keep feet up while sitting Report any new symptoms/ change in health status to your doctor  Report any worsening of wounds to your doctor Continue working with home health for dressing changes, wound care   Follow Up Plan: Telephone follow up appointment with care management team member scheduled for:    05/06/23 at 3 pm       CCM (HYPERTENSION) EXPECTED OUTCOME: MONITOR, SELF-MANAGE AND REDUCE SYMPTOMS OF HYPERTENSION       Current Barriers:  Knowledge Deficits related to Hypertension Chronic Disease Management support and education needs related to Hypertension, diet Cognitive Deficits No Advanced Directives in place- family declines Spoke with patient's wife Chase [name], patient's adult daughters are very  active in the care of pt, pt has aide M-F 4 hours per day, pt has WC, walker, cane, patient has dementia and is on aricept, is now hydroxyzine prn for agitation, family does not monitor blood pressure and states palliative nurse no longer sees pt (per family request), pt has Stage 1 ulcer on lower buttock area and almost healed and right foot wound is completely healed per spouse, caregiver has been dressing this area, family states it is very difficult to get patient to change positions q 2 hours, there is now a  caregiver who comes in at night to get pt into bed and this is working out well.    Planned Interventions: Evaluation of current treatment plan related to hypertension self management and patient's adherence to plan as established by provider;   Reviewed medications with patient and discussed importance of compliance;  Discussed plans with patient for ongoing care management follow up and provided patient with direct contact information for care management team; Advised patient, providing education and rationale, to monitor blood pressure daily and record, calling PCP for findings outside established parameters;  Advised patient to discuss worsening would status with provider; Discussed complications of poorly controlled blood pressure such as heart disease, stroke, circulatory complications, vision complications, kidney impairment, sexual dysfunction;  Reinforced importance of changing positions q 2 hours Reinforced importance of good nutrition Reinforced signs/ symptoms of infection Reinforced low sodium diet  Symptom Management: Take medications as prescribed   Attend all scheduled provider appointments Call pharmacy for medication refills 3-7 days in advance of running out of medications Perform all self care activities independently  Call provider office for new concerns or questions  check blood pressure weekly choose a place to take my blood pressure (home, clinic or office, retail store) write blood pressure results in a log or diary learn about high blood pressure keep a blood pressure log take blood pressure log to all doctor appointments keep all doctor appointments take medications for blood pressure exactly as prescribed report new symptoms to your doctor eat more whole grains, fruits and vegetables, lean meats and healthy fats Follow low sodium diet- read labels Change positions every 2 hours- this is very important Please call doctor if wound to bottom looks worse  (redness, increased drainage, odor, fever) Continue to work with home health Report new findings to your primary care provider  Follow Up Plan: Telephone follow up appointment with care management team member scheduled for:   05/06/23 at 3 pm          The patient verbalized understanding of instructions, educational materials, and care plan provided today and DECLINED offer to receive copy of patient instructions, educational materials, and care plan.   Telephone follow up appointment with care management team member scheduled for:  05/06/23 at 3 pm

## 2023-02-16 ENCOUNTER — Telehealth: Payer: Self-pay

## 2023-02-16 DIAGNOSIS — I509 Heart failure, unspecified: Secondary | ICD-10-CM | POA: Diagnosis not present

## 2023-02-16 DIAGNOSIS — L89611 Pressure ulcer of right heel, stage 1: Secondary | ICD-10-CM | POA: Diagnosis not present

## 2023-02-16 DIAGNOSIS — G40209 Localization-related (focal) (partial) symptomatic epilepsy and epileptic syndromes with complex partial seizures, not intractable, without status epilepticus: Secondary | ICD-10-CM | POA: Diagnosis not present

## 2023-02-16 DIAGNOSIS — E1121 Type 2 diabetes mellitus with diabetic nephropathy: Secondary | ICD-10-CM | POA: Diagnosis not present

## 2023-02-16 DIAGNOSIS — L89152 Pressure ulcer of sacral region, stage 2: Secondary | ICD-10-CM | POA: Diagnosis not present

## 2023-02-16 DIAGNOSIS — I150 Renovascular hypertension: Secondary | ICD-10-CM | POA: Diagnosis not present

## 2023-02-16 NOTE — Telephone Encounter (Signed)
Tried to call pt, telephone line busy at time of call. 

## 2023-02-18 ENCOUNTER — Telehealth: Payer: Self-pay | Admitting: Family Medicine

## 2023-02-18 DIAGNOSIS — L89152 Pressure ulcer of sacral region, stage 2: Secondary | ICD-10-CM | POA: Diagnosis not present

## 2023-02-18 DIAGNOSIS — I509 Heart failure, unspecified: Secondary | ICD-10-CM | POA: Diagnosis not present

## 2023-02-18 DIAGNOSIS — E1121 Type 2 diabetes mellitus with diabetic nephropathy: Secondary | ICD-10-CM | POA: Diagnosis not present

## 2023-02-18 DIAGNOSIS — L89611 Pressure ulcer of right heel, stage 1: Secondary | ICD-10-CM | POA: Diagnosis not present

## 2023-02-18 DIAGNOSIS — G40209 Localization-related (focal) (partial) symptomatic epilepsy and epileptic syndromes with complex partial seizures, not intractable, without status epilepticus: Secondary | ICD-10-CM | POA: Diagnosis not present

## 2023-02-18 DIAGNOSIS — I150 Renovascular hypertension: Secondary | ICD-10-CM | POA: Diagnosis not present

## 2023-02-18 NOTE — Telephone Encounter (Signed)
Patient spouse called has a question about one of the medicine he is taking, please return call at [phone].

## 2023-02-18 NOTE — Telephone Encounter (Signed)
Patients wife aware

## 2023-02-19 DIAGNOSIS — G40209 Localization-related (focal) (partial) symptomatic epilepsy and epileptic syndromes with complex partial seizures, not intractable, without status epilepticus: Secondary | ICD-10-CM | POA: Diagnosis not present

## 2023-02-19 DIAGNOSIS — I150 Renovascular hypertension: Secondary | ICD-10-CM | POA: Diagnosis not present

## 2023-02-19 DIAGNOSIS — L89611 Pressure ulcer of right heel, stage 1: Secondary | ICD-10-CM | POA: Diagnosis not present

## 2023-02-19 DIAGNOSIS — E1121 Type 2 diabetes mellitus with diabetic nephropathy: Secondary | ICD-10-CM | POA: Diagnosis not present

## 2023-02-19 DIAGNOSIS — L89152 Pressure ulcer of sacral region, stage 2: Secondary | ICD-10-CM | POA: Diagnosis not present

## 2023-02-19 DIAGNOSIS — I509 Heart failure, unspecified: Secondary | ICD-10-CM | POA: Diagnosis not present

## 2023-02-22 ENCOUNTER — Telehealth: Payer: Self-pay | Admitting: Family Medicine

## 2023-02-22 NOTE — Telephone Encounter (Signed)
Chase [name], [phone]   Wants verbal order for skilled nursing   1 x a wk for 9 wks 1 PRN

## 2023-02-22 NOTE — Telephone Encounter (Addendum)
Spouse called in on patient behalf.  Wants to know if it is a such thing as something that can be rubbed on skin to bring behaviors down?  Wants a call back in regard.

## 2023-02-23 ENCOUNTER — Telehealth: Payer: Self-pay | Admitting: Family Medicine

## 2023-02-23 DIAGNOSIS — I150 Renovascular hypertension: Secondary | ICD-10-CM | POA: Diagnosis not present

## 2023-02-23 DIAGNOSIS — E782 Mixed hyperlipidemia: Secondary | ICD-10-CM | POA: Diagnosis not present

## 2023-02-23 DIAGNOSIS — E038 Other specified hypothyroidism: Secondary | ICD-10-CM | POA: Diagnosis not present

## 2023-02-23 DIAGNOSIS — F02B11 Dementia in other diseases classified elsewhere, moderate, with agitation: Secondary | ICD-10-CM | POA: Diagnosis not present

## 2023-02-23 DIAGNOSIS — I509 Heart failure, unspecified: Secondary | ICD-10-CM | POA: Diagnosis not present

## 2023-02-23 DIAGNOSIS — Z7902 Long term (current) use of antithrombotics/antiplatelets: Secondary | ICD-10-CM | POA: Diagnosis not present

## 2023-02-23 DIAGNOSIS — L89152 Pressure ulcer of sacral region, stage 2: Secondary | ICD-10-CM | POA: Diagnosis not present

## 2023-02-23 DIAGNOSIS — G40209 Localization-related (focal) (partial) symptomatic epilepsy and epileptic syndromes with complex partial seizures, not intractable, without status epilepticus: Secondary | ICD-10-CM | POA: Diagnosis not present

## 2023-02-23 DIAGNOSIS — Z993 Dependence on wheelchair: Secondary | ICD-10-CM | POA: Diagnosis not present

## 2023-02-23 DIAGNOSIS — E1121 Type 2 diabetes mellitus with diabetic nephropathy: Secondary | ICD-10-CM | POA: Diagnosis not present

## 2023-02-23 NOTE — Telephone Encounter (Signed)
Verbal order given  

## 2023-02-23 NOTE — Telephone Encounter (Signed)
Patient spouse called said patient is not eating well, asked if Dr Moshe Cipro could tell her what would help him eat something. Call back # [phone]

## 2023-02-24 ENCOUNTER — Other Ambulatory Visit: Payer: Self-pay

## 2023-02-24 MED ORDER — HYDROXYZINE HCL 10 MG PO TABS: 10.0000 mg | ORAL_TABLET | Freq: Three times a day (TID) | ORAL | 1 refills | Status: DC | PRN

## 2023-02-24 MED ORDER — MIRTAZAPINE 7.5 MG PO TABS: 7.5000 mg | ORAL_TABLET | Freq: Every day | ORAL | 2 refills | Status: DC

## 2023-02-24 NOTE — Telephone Encounter (Signed)
Rx sent to [facility] in [place] per patient request

## 2023-02-24 NOTE — Telephone Encounter (Signed)
Patients wife aware and medication sent to [facility]

## 2023-02-25 DIAGNOSIS — I150 Renovascular hypertension: Secondary | ICD-10-CM | POA: Diagnosis not present

## 2023-02-25 DIAGNOSIS — L89152 Pressure ulcer of sacral region, stage 2: Secondary | ICD-10-CM | POA: Diagnosis not present

## 2023-02-25 DIAGNOSIS — E1121 Type 2 diabetes mellitus with diabetic nephropathy: Secondary | ICD-10-CM | POA: Diagnosis not present

## 2023-02-25 DIAGNOSIS — I509 Heart failure, unspecified: Secondary | ICD-10-CM | POA: Diagnosis not present

## 2023-02-25 DIAGNOSIS — G40209 Localization-related (focal) (partial) symptomatic epilepsy and epileptic syndromes with complex partial seizures, not intractable, without status epilepticus: Secondary | ICD-10-CM | POA: Diagnosis not present

## 2023-02-25 DIAGNOSIS — E038 Other specified hypothyroidism: Secondary | ICD-10-CM | POA: Diagnosis not present

## 2023-02-26 ENCOUNTER — Encounter: Payer: Self-pay | Admitting: *Deleted

## 2023-02-26 ENCOUNTER — Ambulatory Visit: Payer: Self-pay | Admitting: *Deleted

## 2023-02-26 NOTE — Patient Outreach (Signed)
Care Coordination   Follow Up Visit Note   02/26/2023  Name: Chase [name] MRN: [id number] DOB: Mar 30, 1931  Chase [name] is a 89 y.o. year old male who sees Moshe Cipro, [place] Levo, MD for primary care. I spoke with daughter, Jerimie Mizzell by phone today.  What matters to the patients health and wellness today?   Receive Assistance Arranging In-Home Care Services.   Goals Addressed             This Visit's Progress    Receive Assistance Arranging In-Home Care Services.   On track    Care Coordination Interventions:  Interventions Today    Flowsheet Row Most Recent Value  Chronic Disease   Chronic disease during today's visit Diabetes, Hypertension (HTN), Other, Chronic Kidney Disease/End Stage Renal Disease (ESRD)  [Dementia, Epilepsy, Seizure Disorder, Delusional Disorder, Spinal Stenosis, History of Falls & Difficulty Walking]  General Interventions   General Interventions Discussed/Reviewed General Interventions Discussed, General Interventions Reviewed, Annual Eye Exam, Durable Medical Equipment (DME), Lipid Profile, Annual Foot Exam, Labs, Vaccines, Doctor Visits, Health Screening, [employer], Communication with, Level of Care  [Communication with Primary Care Provider]  Labs Hgb A1c every 3 months, Kidney Function  [Encouraged]  Vaccines COVID-19, Flu, Pneumonia, RSV, Shingles, Tetanus/Pertussis/Diphtheria  [Encouraged]  Doctor Visits Discussed/Reviewed Doctor Visits Discussed, Specialist, Doctor Visits Reviewed, Annual Wellness Visits, PCP  [Encouraged]  Health Screening Prostate, Colonoscopy, Bone Density  [Encouraged]  Durable Medical Equipment (DME) Bed side commode, BP Cuff, Glucomoter, Oxygen, Walker, Community education officer  PCP/Specialist Visits Compliance with follow-up visit  [Encouraged]  Communication with PCP/Specialists, RN  Level of Care Adult Daycare, Location manager, Assisted Living, [place]  Applications  Medicaid, Personal Care Services, [place]  Exercise Interventions   Exercise Discussed/Reviewed Exercise Discussed, Exercise Reviewed, Physical Activity, Weight Managment, Assistive device use and maintanence  [Encouraged]  Physical Activity Discussed/Reviewed Physical Activity Discussed, Home Exercise Program (HEP), Physical Activity Reviewed, Types of exercise  [Encouraged]  Weight Management Weight maintenance  [Encouraged]  Education Interventions   Education Provided Provided Engineer, site, Provided Web-based Education, Provided Education  Provided Verbal Education On Nutrition, Mental Health/Coping with Illness, Foot Care, Eye Care, Applications, Blood Sugar Monitoring, Exercise, Medication, Personal assistant, [employer], When to see the doctor  [Encouraged]  Applications Medicaid, Personal Care Services, [place] Discussed/Reviewed Anxiety, Mental Health Discussed, Depression, Grief and Loss, Mental Health Reviewed, Substance Abuse, Coping Strategies, Suicide, Crisis  Nutrition Interventions   Nutrition Discussed/Reviewed Nutrition Discussed, Adding fruits and vegetables, Increaing proteins, Decreasing fats, Decreasing salt, Supplmental nutrition, Decreasing sugar intake, Portion sizes, Fluid intake, Nutrition Reviewed  [Encouraged]  Pharmacy Interventions   Pharmacy Dicussed/Reviewed Pharmacy Topics Discussed, Medication Adherence, Affording Medications, Pharmacy Topics Reviewed  [Encouraged]  Safety Interventions   Safety Discussed/Reviewed Safety Discussed, Safety Reviewed, Fall Risk, Home Safety  [Encouraged]  Home Safety Assistive Devices, Need for home safety assessment, Refer for home visit, Refer for community resources  Advanced Directive Interventions   Advanced Directives Discussed/Reviewed Advanced Directives Discussed, Advanced Directives Reviewed, End of Life  End of Life Hospice  [Family is interested in pursuing Hospice  Services.]     Active Listening & Reflection Utilized.  Verbalization of Feelings Encouraged. Emotional & Caregiver Support Provided. Feelings of Frustration & Exhaustion Validated. Caregiver Stress & Burnout Acknowledged. Caregiver Resources Reviewed. Self-Enrollment in Caregiver Support Group Emphasized. Problem Solving Interventions Implemented. Task-Centered Solutions Employed.   Solution-Focused Strategies Activated. Acceptance & Commitment Therapy Introduced. Cognitive  Behavioral Therapy Initiated. CSW Collaboration with Patient's Daughter, Kalum Wallar to [place] at Length. CSW Collaboration with Primary Care Provider, Dr. Tula Nakayama with [facility] [phone]), to Express Daughter, Travone Strole Interest in Referring Patient to [place]. CSW Collaboration with Patient's Daughter, Jaimere Haslett to Confirm Patient Continues to [facility] Aide), through [place] [phone]), 4 Hours Per Day, 5 Days Per Week (Monday - Friday). CSW Collaboration with Roselyn Reef, Representative with [facility] [phone]), to Confirm Order Received for An Additional 9 Weeks of [place], 1 Time Per Week, As Needed.  CSW Collaboration with Patient's Daughter, Dewand Verdell to [employer] with CSW [phone]), If She Has Questions, Need Assistance, or If Additional Social Work Needs Are Identified Between Now & Our Next Scheduled Follow-Up Outreach Call.      SDOH assessments and interventions completed:  Yes.  Care Coordination Interventions:  Yes, provided.   Follow up plan: Follow up call scheduled for 03/12/2023 at 1:30 pm.  Encounter Outcome:  Pt. Visit Completed.   Nat Christen, BSW, MSW, LCSW  Licensed [facility]  Mailing [place] [address] [place], [place] [address] Physical Address-[address], [place], [place] [address] Toll Free Main # [phone] Fax # [phone] Cell # [phone] [email or web address]

## 2023-02-26 NOTE — Patient Instructions (Signed)
Visit Information  Thank you for taking time to visit with me today. Please don't hesitate to contact me if I can be of assistance to you.   Following are the goals we discussed today:   Goals Addressed             This Visit's Progress    [place].   On track    Care Coordination Interventions:  Interventions Today    Flowsheet Row Most Recent Value  Chronic Disease   Chronic disease during today's visit Diabetes, Hypertension (HTN), Other, Chronic Kidney Disease/End Stage Renal Disease (ESRD)  [Dementia, Epilepsy, Seizure Disorder, Delusional Disorder, Spinal Stenosis, History of Falls & Difficulty Walking]  General Interventions   General Interventions Discussed/Reviewed General Interventions Discussed, General Interventions Reviewed, Annual Eye Exam, Durable Medical Equipment (DME), Lipid Profile, Annual Foot Exam, Labs, Vaccines, Doctor Visits, Health Screening, [employer], Communication with, Level of Care  [Communication with Primary Care Provider]  Labs Hgb A1c every 3 months, Kidney Function  [Encouraged]  Vaccines COVID-19, Flu, Pneumonia, RSV, Shingles, Tetanus/Pertussis/Diphtheria  [Encouraged]  Doctor Visits Discussed/Reviewed Doctor Visits Discussed, Specialist, Doctor Visits Reviewed, Annual Wellness Visits, PCP  [Encouraged]  Health Screening Prostate, Colonoscopy, Bone Density  [Encouraged]  Durable Medical Equipment (DME) Bed side commode, BP Cuff, Glucomoter, Oxygen, Walker, Community education officer  PCP/Specialist Visits Compliance with follow-up visit  [Encouraged]  Communication with PCP/Specialists, RN  Level of Care Adult Daycare, Location manager, Assisted Living, [place]  Applications Medicaid, Personal Care Services, [place]  Exercise Interventions   Exercise Discussed/Reviewed Exercise Discussed, Exercise Reviewed, Physical Activity, Weight Managment, Assistive  device use and maintanence  [Encouraged]  Physical Activity Discussed/Reviewed Physical Activity Discussed, Home Exercise Program (HEP), Physical Activity Reviewed, Types of exercise  [Encouraged]  Weight Management Weight maintenance  [Encouraged]  Education Interventions   Education Provided Provided Engineer, site, Provided Web-based Education, Provided Education  Provided Verbal Education On Nutrition, Mental Health/Coping with Illness, Foot Care, Eye Care, Applications, Blood Sugar Monitoring, Exercise, Medication, Personal assistant, [employer], When to see the doctor  [Encouraged]  Applications Medicaid, Personal Care Services, [place] Discussed/Reviewed Anxiety, Mental Health Discussed, Depression, Grief and Loss, Mental Health Reviewed, Substance Abuse, Coping Strategies, Suicide, Crisis  Nutrition Interventions   Nutrition Discussed/Reviewed Nutrition Discussed, Adding fruits and vegetables, Increaing proteins, Decreasing fats, Decreasing salt, Supplmental nutrition, Decreasing sugar intake, Portion sizes, Fluid intake, Nutrition Reviewed  [Encouraged]  Pharmacy Interventions   Pharmacy Dicussed/Reviewed Pharmacy Topics Discussed, Medication Adherence, Affording Medications, Pharmacy Topics Reviewed  [Encouraged]  Safety Interventions   Safety Discussed/Reviewed Safety Discussed, Safety Reviewed, Fall Risk, Home Safety  [Encouraged]  Home Safety Assistive Devices, Need for home safety assessment, Refer for home visit, Refer for community resources  Advanced Directive Interventions   Advanced Directives Discussed/Reviewed Advanced Directives Discussed, Advanced Directives Reviewed, End of Life  End of Life Hospice  [Family is interested in pursuing Hospice Services.]     Active Listening & Reflection Utilized.  Verbalization of Feelings Encouraged. Emotional & Caregiver Support Provided. Feelings of Frustration & Exhaustion  Validated. Caregiver Stress & Burnout Acknowledged. Caregiver Resources Reviewed. Self-Enrollment in Caregiver Support Group Emphasized. Problem Solving Interventions Implemented. Task-Centered Solutions Employed.   Solution-Focused Strategies Activated. Acceptance & Commitment Therapy Introduced. Cognitive Behavioral Therapy Initiated. CSW Collaboration with Patient's Daughter, Daycen Kundert to [place] at Length. CSW Collaboration with Primary Care Provider, Dr. Tula Nakayama with [facility] [phone]), to Express Daughter, Vihaanreddy Federowicz Interest in Referring Patient to [place]. CSW Collaboration with Patient's Daughter, Josias Sandner to Confirm Patient Continues to [facility] Aide), through [place] [phone]), 4 Hours Per Day, 5 Days Per Week (Monday - Friday). CSW Collaboration with Roselyn Reef, Representative with [facility] [phone]), to Confirm Order Received for An Additional 9 Weeks of [place], 1 Time Per Week, As Needed.  CSW Collaboration with Patient's Daughter, Jordani Salm to [employer] with CSW [phone]), If She Has Questions, Need Assistance, or If Additional Social Work Needs Are Identified Between Now & Our Next Scheduled Follow-Up Outreach Call.      Our next appointment is by telephone on 03/12/2023 at 1:30 pm.  Please call the care guide team at [phone] if you need to cancel or reschedule your appointment.   If you are experiencing a Mental Health or [place] or need someone to talk to, please call the Suicide and Crisis Lifeline: 988 call the [place] National Suicide Prevention Lifeline: (531)406-4828 or TTY: (912)116-2784 TTY 276-078-0550) to talk to a trained counselor call 1-800-273-TALK (toll free, 24 hour  hotline) go to [facility] [address], [place] [phone]) call the [place]: [phone] call 911  Patient verbalizes understanding of instructions and care plan provided today and agrees to view in [place]. Active MyChart status and patient understanding of how to access instructions and care plan via MyChart confirmed with patient.     Telephone follow up appointment with care management team member scheduled for:  03/12/2023 at 1:30 pm.    Nat Christen, BSW, MSW, [place]  Licensed Clinical Social Worker  [place]  Mailing [place]. [address] [place], [place] [address] Physical Address-[address], [place], [place] [address] Toll Free Main # [phone] Fax # [phone] Cell # [phone] [email or web address]

## 2023-03-02 ENCOUNTER — Telehealth: Payer: Self-pay

## 2023-03-02 DIAGNOSIS — L89322 Pressure ulcer of left buttock, stage 2: Secondary | ICD-10-CM | POA: Diagnosis not present

## 2023-03-02 DIAGNOSIS — Z515 Encounter for palliative care: Secondary | ICD-10-CM | POA: Diagnosis not present

## 2023-03-02 DIAGNOSIS — F01B11 Vascular dementia, moderate, with agitation: Secondary | ICD-10-CM | POA: Diagnosis not present

## 2023-03-02 NOTE — Telephone Encounter (Signed)
FYI spoke with shelia she states they are not necessarily ready for hospice to come in they just need some advice on what the next steps in his care maybe she states all he wants to do is sleep, not eating good and sometimes taking his medications I advised you were out of the office but that I would follow back up with her next week, they do currently have a nurse coming to assist with bathing and dressing needs and she stays 4 hours a day.

## 2023-03-03 ENCOUNTER — Telehealth: Payer: Self-pay | Admitting: Family Medicine

## 2023-03-03 DIAGNOSIS — G40409 Other generalized epilepsy and epileptic syndromes, not intractable, without status epilepticus: Secondary | ICD-10-CM | POA: Diagnosis not present

## 2023-03-03 DIAGNOSIS — I10 Essential (primary) hypertension: Secondary | ICD-10-CM | POA: Diagnosis not present

## 2023-03-03 DIAGNOSIS — G311 Senile degeneration of brain, not elsewhere classified: Secondary | ICD-10-CM | POA: Diagnosis not present

## 2023-03-03 DIAGNOSIS — D631 Anemia in chronic kidney disease: Secondary | ICD-10-CM | POA: Diagnosis not present

## 2023-03-03 DIAGNOSIS — E039 Hypothyroidism, unspecified: Secondary | ICD-10-CM | POA: Diagnosis not present

## 2023-03-03 DIAGNOSIS — I509 Heart failure, unspecified: Secondary | ICD-10-CM | POA: Diagnosis not present

## 2023-03-03 DIAGNOSIS — N184 Chronic kidney disease, stage 4 (severe): Secondary | ICD-10-CM | POA: Diagnosis not present

## 2023-03-03 DIAGNOSIS — I669 Occlusion and stenosis of unspecified cerebral artery: Secondary | ICD-10-CM | POA: Diagnosis not present

## 2023-03-03 DIAGNOSIS — E46 Unspecified protein-calorie malnutrition: Secondary | ICD-10-CM | POA: Diagnosis not present

## 2023-03-03 DIAGNOSIS — Z8673 Personal history of transient ischemic attack (TIA), and cerebral infarction without residual deficits: Secondary | ICD-10-CM | POA: Diagnosis not present

## 2023-03-03 DIAGNOSIS — M1909 Primary osteoarthritis, other specified site: Secondary | ICD-10-CM | POA: Diagnosis not present

## 2023-03-03 DIAGNOSIS — I519 Heart disease, unspecified: Secondary | ICD-10-CM | POA: Diagnosis not present

## 2023-03-03 NOTE — Telephone Encounter (Signed)
Spoke with samantha Dr Moshe Cipro will be the attending if that is what family decides after todays meeting with hospice nurse.

## 2023-03-03 NOTE — Telephone Encounter (Signed)
Chase [name], [name] ,[name] wants to know if Dr. Moshe Cipro be the attending for hospice?

## 2023-03-05 DIAGNOSIS — I10 Essential (primary) hypertension: Secondary | ICD-10-CM | POA: Diagnosis not present

## 2023-03-05 DIAGNOSIS — E039 Hypothyroidism, unspecified: Secondary | ICD-10-CM | POA: Diagnosis not present

## 2023-03-05 DIAGNOSIS — N184 Chronic kidney disease, stage 4 (severe): Secondary | ICD-10-CM | POA: Diagnosis not present

## 2023-03-05 DIAGNOSIS — G311 Senile degeneration of brain, not elsewhere classified: Secondary | ICD-10-CM | POA: Diagnosis not present

## 2023-03-05 DIAGNOSIS — D631 Anemia in chronic kidney disease: Secondary | ICD-10-CM | POA: Diagnosis not present

## 2023-03-05 DIAGNOSIS — I509 Heart failure, unspecified: Secondary | ICD-10-CM | POA: Diagnosis not present

## 2023-03-07 DIAGNOSIS — N184 Chronic kidney disease, stage 4 (severe): Secondary | ICD-10-CM

## 2023-03-07 DIAGNOSIS — E1122 Type 2 diabetes mellitus with diabetic chronic kidney disease: Secondary | ICD-10-CM | POA: Diagnosis not present

## 2023-03-07 DIAGNOSIS — Z794 Long term (current) use of insulin: Secondary | ICD-10-CM

## 2023-03-07 DIAGNOSIS — I129 Hypertensive chronic kidney disease with stage 1 through stage 4 chronic kidney disease, or unspecified chronic kidney disease: Secondary | ICD-10-CM | POA: Diagnosis not present

## 2023-03-08 DIAGNOSIS — E039 Hypothyroidism, unspecified: Secondary | ICD-10-CM | POA: Diagnosis not present

## 2023-03-08 DIAGNOSIS — I509 Heart failure, unspecified: Secondary | ICD-10-CM | POA: Diagnosis not present

## 2023-03-08 DIAGNOSIS — G40409 Other generalized epilepsy and epileptic syndromes, not intractable, without status epilepticus: Secondary | ICD-10-CM | POA: Diagnosis not present

## 2023-03-08 DIAGNOSIS — I669 Occlusion and stenosis of unspecified cerebral artery: Secondary | ICD-10-CM | POA: Diagnosis not present

## 2023-03-08 DIAGNOSIS — E46 Unspecified protein-calorie malnutrition: Secondary | ICD-10-CM | POA: Diagnosis not present

## 2023-03-08 DIAGNOSIS — G311 Senile degeneration of brain, not elsewhere classified: Secondary | ICD-10-CM | POA: Diagnosis not present

## 2023-03-08 DIAGNOSIS — N184 Chronic kidney disease, stage 4 (severe): Secondary | ICD-10-CM | POA: Diagnosis not present

## 2023-03-08 DIAGNOSIS — I10 Essential (primary) hypertension: Secondary | ICD-10-CM | POA: Diagnosis not present

## 2023-03-08 DIAGNOSIS — 419620001 Death: Secondary | SNOMED CT | POA: Diagnosis not present

## 2023-03-08 DIAGNOSIS — D631 Anemia in chronic kidney disease: Secondary | ICD-10-CM | POA: Diagnosis not present

## 2023-03-08 DIAGNOSIS — Z8673 Personal history of transient ischemic attack (TIA), and cerebral infarction without residual deficits: Secondary | ICD-10-CM | POA: Diagnosis not present

## 2023-03-08 DIAGNOSIS — I519 Heart disease, unspecified: Secondary | ICD-10-CM | POA: Diagnosis not present

## 2023-03-08 DIAGNOSIS — M1909 Primary osteoarthritis, other specified site: Secondary | ICD-10-CM | POA: Diagnosis not present

## 2023-03-08 DIAGNOSIS — Z0279 Encounter for issue of other medical certificate: Secondary | ICD-10-CM | POA: Diagnosis not present

## 2023-03-08 DEATH — deceased

## 2023-03-10 DIAGNOSIS — I509 Heart failure, unspecified: Secondary | ICD-10-CM | POA: Diagnosis not present

## 2023-03-10 DIAGNOSIS — E039 Hypothyroidism, unspecified: Secondary | ICD-10-CM | POA: Diagnosis not present

## 2023-03-10 DIAGNOSIS — I10 Essential (primary) hypertension: Secondary | ICD-10-CM | POA: Diagnosis not present

## 2023-03-10 DIAGNOSIS — N184 Chronic kidney disease, stage 4 (severe): Secondary | ICD-10-CM | POA: Diagnosis not present

## 2023-03-10 DIAGNOSIS — D631 Anemia in chronic kidney disease: Secondary | ICD-10-CM | POA: Diagnosis not present

## 2023-03-10 DIAGNOSIS — G311 Senile degeneration of brain, not elsewhere classified: Secondary | ICD-10-CM | POA: Diagnosis not present

## 2023-03-11 ENCOUNTER — Telehealth: Payer: Self-pay | Admitting: Family Medicine

## 2023-03-11 ENCOUNTER — Other Ambulatory Visit: Payer: Self-pay

## 2023-03-11 DIAGNOSIS — E039 Hypothyroidism, unspecified: Secondary | ICD-10-CM | POA: Diagnosis not present

## 2023-03-11 DIAGNOSIS — N184 Chronic kidney disease, stage 4 (severe): Secondary | ICD-10-CM | POA: Diagnosis not present

## 2023-03-11 DIAGNOSIS — I509 Heart failure, unspecified: Secondary | ICD-10-CM | POA: Diagnosis not present

## 2023-03-11 DIAGNOSIS — D631 Anemia in chronic kidney disease: Secondary | ICD-10-CM | POA: Diagnosis not present

## 2023-03-11 DIAGNOSIS — I10 Essential (primary) hypertension: Secondary | ICD-10-CM | POA: Diagnosis not present

## 2023-03-11 DIAGNOSIS — G311 Senile degeneration of brain, not elsewhere classified: Secondary | ICD-10-CM | POA: Diagnosis not present

## 2023-03-11 MED ORDER — HYDRALAZINE HCL 25 MG PO TABS: 25.0000 mg | ORAL_TABLET | Freq: Two times a day (BID) | ORAL | 2 refills | Status: DC

## 2023-03-11 NOTE — Telephone Encounter (Signed)
Pt daughter called stating phar had not received the medication.   hydrALAZINE (APRESOLINE) 25 MG tablet

## 2023-03-11 NOTE — Telephone Encounter (Signed)
Refill sent.

## 2023-03-11 NOTE — Telephone Encounter (Signed)
Called in pharm has changed to [place]   Needs refill on hydrALAZINE (APRESOLINE) 25 MG tablet

## 2023-03-11 NOTE — Telephone Encounter (Signed)
Pt admoitted to hospice , I am the Attending

## 2023-03-11 NOTE — Telephone Encounter (Signed)
Refills sent to [facility]  

## 2023-03-12 ENCOUNTER — Ambulatory Visit: Payer: Self-pay | Admitting: *Deleted

## 2023-03-12 ENCOUNTER — Encounter: Payer: Self-pay | Admitting: *Deleted

## 2023-03-12 NOTE — Patient Outreach (Signed)
Care Coordination   Follow Up Visit Note   03/12/2023  Name: Chase PopeGeorge C Gries MRN: [id number] DOB: 08/07/1931  Chase [name] is a 89 y.o. year old male who sees Lodema HongSimpson, Milus MallickMargaret E, MD for primary care. I spoke with Chase [name] by phone today.  What matters to the patients health and wellness today?  Receive Assistance Arranging In-Home Care Services.   Goals Addressed             This Visit's Progress    Receive Assistance Arranging In-Home Care Services.   On track    Care Coordination Interventions:  Interventions Today    Flowsheet Row Most Recent Value  Chronic Disease   Chronic disease during today's visit Diabetes, Hypertension (HTN), Other, Chronic Kidney Disease/End Stage Renal Disease (ESRD)  [Dementia, Epilepsy, Seizure Disorder, Delusional Disorder, Spinal Stenosis, History of Falls & Difficulty Walking]  General Interventions   General Interventions Discussed/Reviewed General Interventions Discussed, General Interventions Reviewed, Annual Eye Exam, Durable Medical Equipment (DME), Lipid Profile, Annual Foot Exam, Labs, Vaccines, Doctor Visits, Health Screening, [employer] Resources, Communication with, Level of Care  [Communication with Primary Care Provider]  Labs Hgb A1c every 3 months, Kidney Function  [Encouraged]  Vaccines COVID-19, Flu, Pneumonia, RSV, Shingles, Tetanus/Pertussis/Diphtheria  [Encouraged]  Doctor Visits Discussed/Reviewed Doctor Visits Discussed, Specialist, Doctor Visits Reviewed, Annual Wellness Visits, PCP  [Encouraged]  Health Screening Prostate, Colonoscopy, Bone Density  [Encouraged]  Durable Medical Equipment (DME) Bed side commode, BP Cuff, Glucomoter, Oxygen, Walker, Psychologist, forensicWheelchair  Wheelchair Standard  PCP/Specialist Visits Compliance with follow-up visit  [Encouraged]  Communication with PCP/Specialists, RN  Level of Care Adult Daycare, Air traffic controllerApplications, Assisted Living, Skilled Nursing Facility, Teaching laboratory technicianersonal Care Services  Applications Medicaid,  Personal Care Services, [place]  Exercise Interventions   Exercise Discussed/Reviewed Exercise Discussed, Exercise Reviewed, Physical Activity, Weight Managment, Assistive device use and maintanence  [Encouraged]  Physical Activity Discussed/Reviewed Physical Activity Discussed, Home Exercise Program (HEP), Physical Activity Reviewed, Types of exercise  [Encouraged]  Weight Management Weight maintenance  [Encouraged]  Education Interventions   Education Provided Provided Therapist, sportsrinted Education, Provided Web-based Education, Provided Education  Provided Verbal Education On Nutrition, Mental Health/Coping with Illness, Foot Care, Eye Care, Applications, Blood Sugar Monitoring, Exercise, Medication, Development worker, communitynsurance Plans, WalgreenCommunity Resources, When to see the doctor  [Encouraged]  Applications Medicaid, Personal Care Services, [place]  Mental Health Interventions   Mental Health Discussed/Reviewed Anxiety, Mental Health Discussed, Depression, Grief and Loss, Mental Health Reviewed, Substance Abuse, Coping Strategies, Suicide, Crisis  Nutrition Interventions   Nutrition Discussed/Reviewed Nutrition Discussed, Adding fruits and vegetables, Increaing proteins, Decreasing fats, Decreasing salt, Supplmental nutrition, Decreasing sugar intake, Portion sizes, Fluid intake, Nutrition Reviewed  [Encouraged]  Pharmacy Interventions   Pharmacy Dicussed/Reviewed Pharmacy Topics Discussed, Medication Adherence, Affording Medications, Pharmacy Topics Reviewed  [Encouraged]  Safety Interventions   Safety Discussed/Reviewed Safety Discussed, Safety Reviewed, Fall Risk, Home Safety  [Encouraged]  Home Safety Assistive Devices, Need for home safety assessment, Refer for home visit, Refer for community resources  Advanced Directive Interventions   Advanced Directives Discussed/Reviewed Advanced Directives Discussed, Advanced Directives Reviewed, End of Life  End of Life Hospice  [Family is interested in pursuing Hospice Services.]      Active Listening & Reflection Utilized.  Verbalization of Feelings Encouraged. Emotional & Caregiver Support Provided. Feelings of Frustration & Exhaustion Validated. Caregiver Stress & Burnout Acknowledged. Caregiver Resources Reviewed. Self-Enrollment in Caregiver Support Group Emphasized. Problem Solving Interventions Implemented. Task-Centered Solutions Employed.   Solution-Focused Strategies Activated. Acceptance & Commitment Therapy Introduced. Cognitive Behavioral  Therapy Initiated. CSW Collaboration with Patient's Daughter, Marinus Bauerlein to Confirm Interest in Referral to [facility] & Palliative Care Services, Now [facility] [phone]). CSW Collaboration with Primary Care Provider, Dr. Syliva Overman with [facility] [phone]), to Express Daughter, Kapone Watz Interest in Patient Being Referred to [facility] (# [phone]), for Palliative Care Services. CSW Collaboration with Patient's Daughter, Tamika Mohn to [employer] with CSW [phone]), If She Has Questions, Need Assistance, or If Additional Social Work Needs Are Identified Between Now & Our Next Scheduled Follow-Up Outreach Call.      SDOH assessments and interventions completed:  Yes.  Care Coordination Interventions:  Yes, provided.   Follow up plan: Follow up call scheduled for 03/25/2023 at 10:15 am.   Encounter Outcome:  Pt. Visit Completed.   Danford Bad, BSW, MSW, LCSW  Licensed Restaurant manager, fast food [facility]  Mailing [address] [place], [place] [address] Physical Address-[address], [place], [place] [address] Toll Free Main # [phone] Fax # [phone] Cell # [phone] [email or web address]

## 2023-03-12 NOTE — Patient Instructions (Signed)
Visit Information  Thank you for taking time to visit with me today. Please don't hesitate to contact me if I can be of assistance to you.   Following are the goals we discussed today:   Goals Addressed             This Visit's Progress    Receive Assistance Arranging In-Home Care Services.   On track    Care Coordination Interventions:  Interventions Today    Flowsheet Row Most Recent Value  Chronic Disease   Chronic disease during today's visit Diabetes, Hypertension (HTN), Other, Chronic Kidney Disease/End Stage Renal Disease (ESRD)  [Dementia, Epilepsy, Seizure Disorder, Delusional Disorder, Spinal Stenosis, History of Falls & Difficulty Walking]  General Interventions   General Interventions Discussed/Reviewed General Interventions Discussed, General Interventions Reviewed, Annual Eye Exam, Durable Medical Equipment (DME), Lipid Profile, Annual Foot Exam, Labs, Vaccines, Doctor Visits, Health Screening, [employer], Communication with, Level of Care  [Communication with Primary Care Provider]  Labs Hgb A1c every 3 months, Kidney Function  [Encouraged]  Vaccines COVID-19, Flu, Pneumonia, RSV, Shingles, Tetanus/Pertussis/Diphtheria  [Encouraged]  Doctor Visits Discussed/Reviewed Doctor Visits Discussed, Specialist, Doctor Visits Reviewed, Annual Wellness Visits, PCP  [Encouraged]  Health Screening Prostate, Colonoscopy, Bone Density  [Encouraged]  Durable Medical Equipment (DME) Bed side commode, BP Cuff, Glucomoter, Oxygen, Walker, Psychologist, forensic  PCP/Specialist Visits Compliance with follow-up visit  [Encouraged]  Communication with PCP/Specialists, RN  Level of Care Adult Daycare, Air traffic controller, Assisted Living, Skilled Nursing Facility, Teaching laboratory technician Medicaid, Personal Care Services, [place]  Exercise Interventions   Exercise Discussed/Reviewed Exercise Discussed, Exercise Reviewed, Physical Activity, Weight Managment, Assistive  device use and maintanence  [Encouraged]  Physical Activity Discussed/Reviewed Physical Activity Discussed, Home Exercise Program (HEP), Physical Activity Reviewed, Types of exercise  [Encouraged]  Weight Management Weight maintenance  [Encouraged]  Education Interventions   Education Provided Provided Therapist, sports, Provided Web-based Education, Provided Education  Provided Verbal Education On Nutrition, Mental Health/Coping with Illness, Foot Care, Eye Care, Applications, Blood Sugar Monitoring, Exercise, Medication, Development worker, community, [employer], When to see the doctor  [Encouraged]  Applications Medicaid, Personal Care Services, [place]  Mental Health Interventions   Mental Health Discussed/Reviewed Anxiety, Mental Health Discussed, Depression, Grief and Loss, Mental Health Reviewed, Substance Abuse, Coping Strategies, Suicide, Crisis  Nutrition Interventions   Nutrition Discussed/Reviewed Nutrition Discussed, Adding fruits and vegetables, Increaing proteins, Decreasing fats, Decreasing salt, Supplmental nutrition, Decreasing sugar intake, Portion sizes, Fluid intake, Nutrition Reviewed  [Encouraged]  Pharmacy Interventions   Pharmacy Dicussed/Reviewed Pharmacy Topics Discussed, Medication Adherence, Affording Medications, Pharmacy Topics Reviewed  [Encouraged]  Safety Interventions   Safety Discussed/Reviewed Safety Discussed, Safety Reviewed, Fall Risk, Home Safety  [Encouraged]  Home Safety Assistive Devices, Need for home safety assessment, Refer for home visit, Refer for community resources  Advanced Directive Interventions   Advanced Directives Discussed/Reviewed Advanced Directives Discussed, Advanced Directives Reviewed, End of Life  End of Life Hospice  [Family is interested in pursuing Hospice Services.]     Active Listening & Reflection Utilized.  Verbalization of Feelings Encouraged. Emotional & Caregiver Support Provided. Feelings of Frustration & Exhaustion  Validated. Caregiver Stress & Burnout Acknowledged. Caregiver Resources Reviewed. Self-Enrollment in Caregiver Support Group Emphasized. Problem Solving Interventions Implemented. Task-Centered Solutions Employed.   Solution-Focused Strategies Activated. Acceptance & Commitment Therapy Introduced. Cognitive Behavioral Therapy Initiated. CSW Collaboration with Patient's Daughter, Ramiro Cole to Confirm Interest in Referral to [facility] & Palliative Care Services, Now [facility] [phone]). CSW Collaboration with Primary  Care Provider, Dr. Syliva OvermanMargaret Simpson with [facility] [phone]), to Express Daughter, Jarrett Sohoeresa Baumgardner's Interest in Patient Being Referred to [facility] (# [phone]), for Palliative Care Services. CSW Collaboration with Patient's Daughter, Clint Guyeresa Timm to [employer] Direct Contact with CSW [phone](# [phone]), If She Has Questions, Need Assistance, or If Additional Social Work Needs Are Identified Between Now & Our Next Scheduled Follow-Up Outreach Call.      Our next appointment is by telephone on 03/25/2023 at 10:15 am.  Please call the care guide team at [phone] if you need to cancel or reschedule your appointment.   If you are experiencing a Mental Health or Behavioral Health Crisis or need someone to talk to, please call the Suicide and Crisis Lifeline: 988 call the [place] National Suicide Prevention Lifeline: 778-707-36041-623-354-6901 or TTY: (270) 645-84651-800-799-4 TTY 567-412-8667(1-814-491-7887) to talk to a trained counselor call 1-800-273-TALK (toll free, 24 hour hotline) go to [facility] [address], [place] [phone]) call the [facility] Crisis Line: [phone] call 911  Patient verbalizes understanding of instructions and care plan provided today and agrees to view in MyChart. Active MyChart status and patient understanding of how to access instructions and care plan via MyChart  confirmed with patient.     Telephone follow up appointment with care management team member scheduled for:  03/25/2023 at 10:15 am.  Danford BadJoanna Noreta Kue, BSW, MSW, LCSW  Licensed Clinical Social Worker  [employer] [facility] Care Management [facility]  Mailing [address], [place], [place] [address] Physical Address-[address], [place], [place] [address] Toll Free Main # [phone] Fax # [phone] Cell # [phone] [email or web address]

## 2023-03-15 DIAGNOSIS — I509 Heart failure, unspecified: Secondary | ICD-10-CM | POA: Diagnosis not present

## 2023-03-15 DIAGNOSIS — G311 Senile degeneration of brain, not elsewhere classified: Secondary | ICD-10-CM | POA: Diagnosis not present

## 2023-03-15 DIAGNOSIS — N184 Chronic kidney disease, stage 4 (severe): Secondary | ICD-10-CM | POA: Diagnosis not present

## 2023-03-15 DIAGNOSIS — I10 Essential (primary) hypertension: Secondary | ICD-10-CM | POA: Diagnosis not present

## 2023-03-15 DIAGNOSIS — E039 Hypothyroidism, unspecified: Secondary | ICD-10-CM | POA: Diagnosis not present

## 2023-03-15 DIAGNOSIS — D631 Anemia in chronic kidney disease: Secondary | ICD-10-CM | POA: Diagnosis not present

## 2023-03-16 ENCOUNTER — Telehealth: Payer: Self-pay | Admitting: Family Medicine

## 2023-03-16 DIAGNOSIS — Z0279 Encounter for issue of other medical certificate: Secondary | ICD-10-CM

## 2023-03-16 NOTE — Telephone Encounter (Signed)
FMLA forms   Copied Noted Sleeved  Called son casmer lane [phone] when ready

## 2023-03-18 ENCOUNTER — Telehealth: Payer: Self-pay | Admitting: Family Medicine

## 2023-03-18 ENCOUNTER — Other Ambulatory Visit: Payer: Self-pay

## 2023-03-18 DIAGNOSIS — G311 Senile degeneration of brain, not elsewhere classified: Secondary | ICD-10-CM | POA: Diagnosis not present

## 2023-03-18 DIAGNOSIS — E039 Hypothyroidism, unspecified: Secondary | ICD-10-CM | POA: Diagnosis not present

## 2023-03-18 DIAGNOSIS — D631 Anemia in chronic kidney disease: Secondary | ICD-10-CM | POA: Diagnosis not present

## 2023-03-18 DIAGNOSIS — N184 Chronic kidney disease, stage 4 (severe): Secondary | ICD-10-CM | POA: Diagnosis not present

## 2023-03-18 DIAGNOSIS — I509 Heart failure, unspecified: Secondary | ICD-10-CM | POA: Diagnosis not present

## 2023-03-18 DIAGNOSIS — I10 Essential (primary) hypertension: Secondary | ICD-10-CM | POA: Diagnosis not present

## 2023-03-18 MED ORDER — MONTELUKAST SODIUM 10 MG PO TABS: 10.0000 mg | ORAL_TABLET | Freq: Every day | ORAL | 3 refills | Status: DC

## 2023-03-18 NOTE — Telephone Encounter (Signed)
New message    1. Which medications need to be refilled? (please list name of each medication and dose if known) montelukast (SINGULAIR) 10 MG tablet   2. Which pharmacy/location (including street and city if local pharmacy) is medication to be sent to?[facility] - [place], [place] - [address]   3. Do they need a 30 day or 90 day supply? 15 day supply with 3 refills

## 2023-03-18 NOTE — Telephone Encounter (Signed)
Refills sent to pharmacy. 

## 2023-03-22 DIAGNOSIS — N184 Chronic kidney disease, stage 4 (severe): Secondary | ICD-10-CM | POA: Diagnosis not present

## 2023-03-22 DIAGNOSIS — E039 Hypothyroidism, unspecified: Secondary | ICD-10-CM | POA: Diagnosis not present

## 2023-03-22 DIAGNOSIS — G311 Senile degeneration of brain, not elsewhere classified: Secondary | ICD-10-CM | POA: Diagnosis not present

## 2023-03-22 DIAGNOSIS — I10 Essential (primary) hypertension: Secondary | ICD-10-CM | POA: Diagnosis not present

## 2023-03-22 DIAGNOSIS — I509 Heart failure, unspecified: Secondary | ICD-10-CM | POA: Diagnosis not present

## 2023-03-22 DIAGNOSIS — D631 Anemia in chronic kidney disease: Secondary | ICD-10-CM | POA: Diagnosis not present

## 2023-03-24 DIAGNOSIS — D631 Anemia in chronic kidney disease: Secondary | ICD-10-CM | POA: Diagnosis not present

## 2023-03-24 DIAGNOSIS — E039 Hypothyroidism, unspecified: Secondary | ICD-10-CM | POA: Diagnosis not present

## 2023-03-24 DIAGNOSIS — I10 Essential (primary) hypertension: Secondary | ICD-10-CM | POA: Diagnosis not present

## 2023-03-24 DIAGNOSIS — G311 Senile degeneration of brain, not elsewhere classified: Secondary | ICD-10-CM | POA: Diagnosis not present

## 2023-03-24 DIAGNOSIS — N184 Chronic kidney disease, stage 4 (severe): Secondary | ICD-10-CM | POA: Diagnosis not present

## 2023-03-24 DIAGNOSIS — I509 Heart failure, unspecified: Secondary | ICD-10-CM | POA: Diagnosis not present

## 2023-03-25 ENCOUNTER — Ambulatory Visit: Payer: Self-pay | Admitting: *Deleted

## 2023-03-25 ENCOUNTER — Encounter: Payer: Self-pay | Admitting: *Deleted

## 2023-03-25 NOTE — Patient Instructions (Signed)
Visit Information  Thank you for taking time to visit with me today. Please don't hesitate to contact me if I can be of assistance to you.   Following are the goals we discussed today:   Goals Addressed             This Visit's Progress    COMPLETED: Receive Assistance Arranging In-Home Care Services.   On track    Care Coordination Interventions:  Interventions Today    Flowsheet Row Most Recent Value  Chronic Disease   Chronic disease during today's visit Diabetes, Hypertension (HTN), Other, Chronic Kidney Disease/End Stage Renal Disease (ESRD)  [Dementia, Epilepsy, Seizure Disorder, Delusional Disorder, Spinal Stenosis, History of Falls & Difficulty Walking]  General Interventions   General Interventions Discussed/Reviewed General Interventions Discussed, General Interventions Reviewed, Annual Eye Exam, Durable Medical Equipment (DME), Lipid Profile, Annual Foot Exam, Labs, Vaccines, Doctor Visits, Health Screening, [employer], Communication with, Level of Care  [Communication with Primary Care Provider]  Labs Hgb A1c every 3 months, Kidney Function  [Encouraged]  Vaccines COVID-19, Flu, Pneumonia, RSV, Shingles, Tetanus/Pertussis/Diphtheria  [Encouraged]  Doctor Visits Discussed/Reviewed Doctor Visits Discussed, Specialist, Doctor Visits Reviewed, Annual Wellness Visits, PCP  [Encouraged]  Health Screening Prostate, Colonoscopy, Bone Density  [Encouraged]  Durable Medical Equipment (DME) Bed side commode, BP Cuff, Glucomoter, Oxygen, Walker, Psychologist, forensic  PCP/Specialist Visits Compliance with follow-up visit  [Encouraged]  Communication with PCP/Specialists, RN  Level of Care Adult Daycare, Air traffic controller, Assisted Living, Skilled Nursing Facility, Teaching laboratory technician Medicaid, Personal Care Services, [place]  Exercise Interventions   Exercise Discussed/Reviewed Exercise Discussed, Exercise Reviewed, Physical Activity, Weight Managment,  Assistive device use and maintanence  [Encouraged]  Physical Activity Discussed/Reviewed Physical Activity Discussed, Home Exercise Program (HEP), Physical Activity Reviewed, Types of exercise  [Encouraged]  Weight Management Weight maintenance  [Encouraged]  Education Interventions   Education Provided Provided Therapist, sports, Provided Web-based Education, Provided Education  Provided Verbal Education On Nutrition, Mental Health/Coping with Illness, Foot Care, Eye Care, Applications, Blood Sugar Monitoring, Exercise, Medication, Development worker, community, [employer], When to see the doctor  [Encouraged]  Applications Medicaid, Personal Care Services, [place]  Mental Health Interventions   Mental Health Discussed/Reviewed Anxiety, Mental Health Discussed, Depression, Grief and Loss, Mental Health Reviewed, Substance Abuse, Coping Strategies, Suicide, Crisis  Nutrition Interventions   Nutrition Discussed/Reviewed Nutrition Discussed, Adding fruits and vegetables, Increaing proteins, Decreasing fats, Decreasing salt, Supplmental nutrition, Decreasing sugar intake, Portion sizes, Fluid intake, Nutrition Reviewed  [Encouraged]  Pharmacy Interventions   Pharmacy Dicussed/Reviewed Pharmacy Topics Discussed, Medication Adherence, Affording Medications, Pharmacy Topics Reviewed  [Encouraged]  Safety Interventions   Safety Discussed/Reviewed Safety Discussed, Safety Reviewed, Fall Risk, Home Safety  [Encouraged]  Home Safety Assistive Devices, Need for home safety assessment, Refer for home visit, Refer for community resources  Advanced Directive Interventions   Advanced Directives Discussed/Reviewed Advanced Directives Discussed, Advanced Directives Reviewed, End of Life  End of Life Hospice  [Family is interested in pursuing Hospice Services.]     Active Listening & Reflection Utilized.  Verbalization of Feelings Encouraged. Emotional & Caregiver Support Provided. Feelings of Frustration & Exhaustion  Validated. Caregiver Stress & Burnout Acknowledged. Caregiver Resources Reviewed. Self-Enrollment in Caregiver Support Group Emphasized. Problem Solving Interventions Activated. Task-Centered Solutions Employed.   Solution-Focused Strategies Indicated. Acceptance & Commitment Therapy Implemented. Cognitive Behavioral Therapy Initiated. CSW Collaboration with Wife, Corrion Stirewalt to [employer] for Teachers Insurance and Annuity Association for Disabled Adults (CAP/DA) Services, through Aging, Disability & Transit Services of [place] (#  [phone]). CSW Collaboration with Wife, Braylee Bosher to [employer] Program & Discuss Services Provided with Teachers Insurance and Annuity Association for Disabled Adults (CAP/DA) Services, through Aging, Disability & Transit Services of [place] [phone]), Which Include: ~ A Medicaid Program Designed to Allow Disabled Adults the Ability to Remain  Safely at Home. ~ Provides a Neurosurgeon of Services to Complement Current Family Support. ~ In-Home Aide Services Provided Monday - Friday, 8 AM - 12 PM. ~ Primary school teacher with Personal Care Tasks, Which Include:  Bathing, Dressing, Walking, Toileting, Incontinence Care, Light Housekeeping Duties, Occupational psychologist.  CSW Collaboration with Wife, Brax Walen to Confirm Disinterest in Referral for Patient to Hospice & Palliative Care Services, Now [facility] [phone]). CSW Collaboration with Wife, Marris Frontera to [employer] with CSW [phone]), If She Has Questions, Needs Assistance, or If Additional Social Work Needs Are Identified in The Near Future.        Please call the care guide team at [phone] if you need to cancel or reschedule your appointment.   If you are experiencing a Mental Health or Behavioral Health Crisis or need someone to talk to, please call the Suicide and Crisis Lifeline: 988 call the [place] National Suicide Prevention Lifeline:  236-194-3406 or TTY: (437)454-7100 TTY 478-495-7323) to talk to a trained counselor call 1-800-273-TALK (toll free, 24 hour hotline) go to [facility] [address], [place] [phone]) call the [facility] Crisis Line: [phone] call 911  Patient verbalizes understanding of instructions and care plan provided today and agrees to view in MyChart. Active MyChart status and patient understanding of how to access instructions and care plan via MyChart confirmed with patient.     No further follow up required.  Danford Bad, BSW, MSW, LCSW  Licensed Restaurant manager, fast food [facility]  Mailing [address], [place], [place] [address] Physical Address-[address] [place], [place] [address] Toll Free Main # [phone] Fax # [phone] Cell # [phone] Mardene [email or web address]

## 2023-03-25 NOTE — Patient Outreach (Signed)
Care Coordination   Follow Up Visit Note   03/25/2023  Name: Chase [name] MRN: [id number] DOB: May 26, 1931  Chase [name] is a 89 y.o. year old male who sees Lodema Hong, Milus Mallick, MD for primary care. I spoke with patient's wife, Chase [name] by phone today.  What matters to the patients health and wellness today?  Receive Assistance Arranging In-Home Care Services.   Goals Addressed             This Visit's Progress    COMPLETED: Receive Assistance Arranging In-Home Care Services.   On track    Care Coordination Interventions:  Interventions Today    Flowsheet Row Most Recent Value  Chronic Disease   Chronic disease during today's visit Diabetes, Hypertension (HTN), Other, Chronic Kidney Disease/End Stage Renal Disease (ESRD)  [Dementia, Epilepsy, Seizure Disorder, Delusional Disorder, Spinal Stenosis, History of Falls & Difficulty Walking]  General Interventions   General Interventions Discussed/Reviewed General Interventions Discussed, General Interventions Reviewed, Annual Eye Exam, Durable Medical Equipment (DME), Lipid Profile, Annual Foot Exam, Labs, Vaccines, Doctor Visits, Health Screening, [facility], Communication with, Level of Care  [Communication with Primary Care Provider]  Labs Hgb A1c every 3 months, Kidney Function  [Encouraged]  Vaccines COVID-19, Flu, Pneumonia, RSV, Shingles, Tetanus/Pertussis/Diphtheria  [Encouraged]  Doctor Visits Discussed/Reviewed Doctor Visits Discussed, Specialist, Doctor Visits Reviewed, Annual Wellness Visits, PCP  [Encouraged]  Health Screening Prostate, Colonoscopy, Bone Density  [Encouraged]  Durable Medical Equipment (DME) Bed side commode, BP Cuff, Glucomoter, Oxygen, Walker, Psychologist, forensic  PCP/Specialist Visits Compliance with follow-up visit  [Encouraged]  Communication with PCP/Specialists, RN  Level of Care Adult Daycare, Air traffic controller, Assisted Living, Skilled Nursing Facility, Geologist, engineering Medicaid, Personal Care Services, [place]-2  Exercise Interventions   Exercise Discussed/Reviewed Exercise Discussed, Exercise Reviewed, Physical Activity, Weight Managment, Assistive device use and maintanence  [Encouraged]  Physical Activity Discussed/Reviewed Physical Activity Discussed, Home Exercise Program (HEP), Physical Activity Reviewed, Types of exercise  [Encouraged]  Weight Management Weight maintenance  [Encouraged]  Education Interventions   Education Provided Provided Therapist, sports, Provided Web-based Education, Provided Education  Provided Verbal Education On Nutrition, Mental Health/Coping with Illness, Foot Care, Eye Care, Applications, Blood Sugar Monitoring, Exercise, Medication, Development worker, community, [facility], When to see the doctor  [Encouraged]  Applications Medicaid, Personal Care Services, [place]-2  Mental Health Interventions   Mental Health Discussed/Reviewed Anxiety, Mental Health Discussed, Depression, Grief and Loss, Mental Health Reviewed, Substance Abuse, Coping Strategies, Suicide, Crisis  Nutrition Interventions   Nutrition Discussed/Reviewed Nutrition Discussed, Adding fruits and vegetables, Increaing proteins, Decreasing fats, Decreasing salt, Supplmental nutrition, Decreasing sugar intake, Portion sizes, Fluid intake, Nutrition Reviewed  [Encouraged]  Pharmacy Interventions   Pharmacy Dicussed/Reviewed Pharmacy Topics Discussed, Medication Adherence, Affording Medications, Pharmacy Topics Reviewed  [Encouraged]  Safety Interventions   Safety Discussed/Reviewed Safety Discussed, Safety Reviewed, Fall Risk, Home Safety  [Encouraged]  Home Safety Assistive Devices, Need for home safety assessment, Refer for home visit, Refer for community resources  Advanced Directive Interventions   Advanced Directives Discussed/Reviewed Advanced Directives Discussed, Advanced Directives Reviewed, End of Life  End of Life Hospice  [Family is interested  in pursuing Hospice Services.]     Active Listening & Reflection Utilized.  Verbalization of Feelings Encouraged. Emotional & Caregiver Support Provided. Feelings of Frustration & Exhaustion Validated. Caregiver Stress & Burnout Acknowledged. Caregiver Resources Reviewed. Self-Enrollment in Caregiver Support Group Emphasized. Problem Solving Interventions Activated. Task-Centered Solutions Employed.   Solution-Focused Strategies Indicated. Acceptance & Commitment Therapy Implemented.  Cognitive Behavioral Therapy Initiated. CSW Collaboration with Wife, Chase [name] to [employer] for Teachers Insurance and Annuity Association for Disabled Adults (CAP/DA) Services, through Aging, Disability & Transit Services of [place] [phone]). CSW Collaboration with Wife, Chase [name] to [employer] Program & Discuss Services Provided with Teachers Insurance and Annuity Association for Disabled Adults (CAP/DA) Services, through [facility]ging, Disability [facility] Transit [facility] [place] [phone]), Which Include: ~ A Medicaid Program Designed to Allow Disabled Adults the Ability to Remain  Safely at Home. ~ Provides a Neurosurgeon of Services to Complement Current Family Support. ~ In-Home Aide Services Provided Monday - Friday, 8 AM - 12 PM. ~ Primary school teacher with Personal Care Tasks, Which Include:  Bathing, Dressing, Walking, Toileting, Incontinence Care, Light Housekeeping Duties, Occupational psychologist.  CSW Collaboration with Wife, Chase [name] to Confirm Disinterest in Referral for Patient to Hospice & Palliative Care Services, Now [facility] [phone]). CSW Collaboration with Wife, Chase [name] to [employer] with CSW [phone]), If She Has Questions, Needs Assistance, or If Additional Social Work Needs Are Identified in The Near Future.             SDOH assessments and interventions completed:  Yes.  Care Coordination Interventions:  Yes,  provided.   Follow up plan: No further intervention required.   Encounter Outcome:  Pt. Visit Completed.   Danford Bad, BSW, MSW, LCSW  Licensed Restaurant manager, fast food [facility]  [employer] [place] [address] [place], [place] [address] Physical Address-[address], [place], [place] [address] Toll Free Main # [phone] Fax # [phone] Cell # [phone] [email or web address]

## 2023-03-29 ENCOUNTER — Telehealth: Payer: Self-pay | Admitting: Family Medicine

## 2023-03-29 ENCOUNTER — Other Ambulatory Visit: Payer: Self-pay

## 2023-03-29 DIAGNOSIS — E039 Hypothyroidism, unspecified: Secondary | ICD-10-CM | POA: Diagnosis not present

## 2023-03-29 DIAGNOSIS — I509 Heart failure, unspecified: Secondary | ICD-10-CM | POA: Diagnosis not present

## 2023-03-29 DIAGNOSIS — G311 Senile degeneration of brain, not elsewhere classified: Secondary | ICD-10-CM | POA: Diagnosis not present

## 2023-03-29 DIAGNOSIS — D631 Anemia in chronic kidney disease: Secondary | ICD-10-CM | POA: Diagnosis not present

## 2023-03-29 DIAGNOSIS — N184 Chronic kidney disease, stage 4 (severe): Secondary | ICD-10-CM | POA: Diagnosis not present

## 2023-03-29 DIAGNOSIS — I10 Essential (primary) hypertension: Secondary | ICD-10-CM | POA: Diagnosis not present

## 2023-03-29 DIAGNOSIS — H04129 Dry eye syndrome of unspecified lacrimal gland: Secondary | ICD-10-CM

## 2023-03-29 MED ORDER — CALCIUM CARBONATE ANTACID 500 MG PO CHEW: 0.5000 | CHEWABLE_TABLET | Freq: Two times a day (BID) | ORAL | 3 refills | Status: DC

## 2023-03-29 MED ORDER — OLOPATADINE HCL 0.1 % OP SOLN
OPHTHALMIC | 3 refills | Status: DC
Start: 2023-03-29 — End: 2024-02-10

## 2023-03-29 NOTE — Telephone Encounter (Signed)
Meds refilled.

## 2023-03-29 NOTE — Telephone Encounter (Signed)
[place] [phone] from Hospice needs some meds refilled for pt. [employer])  olopatadine (PATANOL) 0.1 % ophthalmic solution [[id number]]   calcium carbonate (TUMS - DOSED IN MG ELEMENTAL CALCIUM) 500 MG chewable tablet [[id number]]

## 2023-03-31 DIAGNOSIS — I10 Essential (primary) hypertension: Secondary | ICD-10-CM | POA: Diagnosis not present

## 2023-03-31 DIAGNOSIS — N184 Chronic kidney disease, stage 4 (severe): Secondary | ICD-10-CM | POA: Diagnosis not present

## 2023-03-31 DIAGNOSIS — I509 Heart failure, unspecified: Secondary | ICD-10-CM | POA: Diagnosis not present

## 2023-03-31 DIAGNOSIS — G311 Senile degeneration of brain, not elsewhere classified: Secondary | ICD-10-CM | POA: Diagnosis not present

## 2023-03-31 DIAGNOSIS — D631 Anemia in chronic kidney disease: Secondary | ICD-10-CM | POA: Diagnosis not present

## 2023-03-31 DIAGNOSIS — E039 Hypothyroidism, unspecified: Secondary | ICD-10-CM | POA: Diagnosis not present

## 2023-04-05 DIAGNOSIS — G311 Senile degeneration of brain, not elsewhere classified: Secondary | ICD-10-CM | POA: Diagnosis not present

## 2023-04-05 DIAGNOSIS — E039 Hypothyroidism, unspecified: Secondary | ICD-10-CM | POA: Diagnosis not present

## 2023-04-05 DIAGNOSIS — D631 Anemia in chronic kidney disease: Secondary | ICD-10-CM | POA: Diagnosis not present

## 2023-04-05 DIAGNOSIS — I509 Heart failure, unspecified: Secondary | ICD-10-CM | POA: Diagnosis not present

## 2023-04-05 DIAGNOSIS — I10 Essential (primary) hypertension: Secondary | ICD-10-CM | POA: Diagnosis not present

## 2023-04-05 DIAGNOSIS — N184 Chronic kidney disease, stage 4 (severe): Secondary | ICD-10-CM | POA: Diagnosis not present

## 2023-04-06 DIAGNOSIS — N179 Acute kidney failure, unspecified: Secondary | ICD-10-CM | POA: Diagnosis not present

## 2023-04-06 DIAGNOSIS — R82998 Other abnormal findings in urine: Secondary | ICD-10-CM | POA: Diagnosis not present

## 2023-04-06 DIAGNOSIS — E1122 Type 2 diabetes mellitus with diabetic chronic kidney disease: Secondary | ICD-10-CM | POA: Diagnosis not present

## 2023-04-06 DIAGNOSIS — R531 Weakness: Secondary | ICD-10-CM | POA: Diagnosis not present

## 2023-04-06 DIAGNOSIS — K8020 Calculus of gallbladder without cholecystitis without obstruction: Secondary | ICD-10-CM | POA: Diagnosis not present

## 2023-04-06 DIAGNOSIS — R509 Fever, unspecified: Secondary | ICD-10-CM | POA: Diagnosis not present

## 2023-04-06 DIAGNOSIS — I10 Essential (primary) hypertension: Secondary | ICD-10-CM | POA: Diagnosis not present

## 2023-04-06 DIAGNOSIS — I509 Heart failure, unspecified: Secondary | ICD-10-CM | POA: Diagnosis not present

## 2023-04-06 DIAGNOSIS — N390 Urinary tract infection, site not specified: Secondary | ICD-10-CM | POA: Diagnosis not present

## 2023-04-06 DIAGNOSIS — E039 Hypothyroidism, unspecified: Secondary | ICD-10-CM | POA: Diagnosis not present

## 2023-04-06 DIAGNOSIS — I700 Atherosclerosis of aorta: Secondary | ICD-10-CM | POA: Diagnosis not present

## 2023-04-06 DIAGNOSIS — N200 Calculus of kidney: Secondary | ICD-10-CM | POA: Diagnosis not present

## 2023-04-06 DIAGNOSIS — Z792 Long term (current) use of antibiotics: Secondary | ICD-10-CM | POA: Diagnosis not present

## 2023-04-06 DIAGNOSIS — I129 Hypertensive chronic kidney disease with stage 1 through stage 4 chronic kidney disease, or unspecified chronic kidney disease: Secondary | ICD-10-CM | POA: Diagnosis not present

## 2023-04-06 DIAGNOSIS — A419 Sepsis, unspecified organism: Secondary | ICD-10-CM | POA: Diagnosis not present

## 2023-04-06 DIAGNOSIS — D631 Anemia in chronic kidney disease: Secondary | ICD-10-CM | POA: Diagnosis not present

## 2023-04-06 DIAGNOSIS — G311 Senile degeneration of brain, not elsewhere classified: Secondary | ICD-10-CM | POA: Diagnosis not present

## 2023-04-06 DIAGNOSIS — R110 Nausea: Secondary | ICD-10-CM | POA: Diagnosis not present

## 2023-04-06 DIAGNOSIS — N3289 Other specified disorders of bladder: Secondary | ICD-10-CM | POA: Diagnosis not present

## 2023-04-06 DIAGNOSIS — Z79899 Other long term (current) drug therapy: Secondary | ICD-10-CM | POA: Diagnosis not present

## 2023-04-06 DIAGNOSIS — N184 Chronic kidney disease, stage 4 (severe): Secondary | ICD-10-CM | POA: Diagnosis not present

## 2023-04-07 DIAGNOSIS — Z792 Long term (current) use of antibiotics: Secondary | ICD-10-CM | POA: Diagnosis not present

## 2023-04-07 DIAGNOSIS — G311 Senile degeneration of brain, not elsewhere classified: Secondary | ICD-10-CM | POA: Diagnosis not present

## 2023-04-07 DIAGNOSIS — R5381 Other malaise: Secondary | ICD-10-CM | POA: Diagnosis not present

## 2023-04-07 DIAGNOSIS — F0390 Unspecified dementia without behavioral disturbance: Secondary | ICD-10-CM | POA: Diagnosis not present

## 2023-04-07 DIAGNOSIS — I669 Occlusion and stenosis of unspecified cerebral artery: Secondary | ICD-10-CM | POA: Diagnosis not present

## 2023-04-07 DIAGNOSIS — N184 Chronic kidney disease, stage 4 (severe): Secondary | ICD-10-CM | POA: Diagnosis not present

## 2023-04-07 DIAGNOSIS — A4150 Gram-negative sepsis, unspecified: Secondary | ICD-10-CM | POA: Diagnosis not present

## 2023-04-07 DIAGNOSIS — E039 Hypothyroidism, unspecified: Secondary | ICD-10-CM | POA: Diagnosis not present

## 2023-04-07 DIAGNOSIS — E46 Unspecified protein-calorie malnutrition: Secondary | ICD-10-CM | POA: Diagnosis not present

## 2023-04-07 DIAGNOSIS — Z8673 Personal history of transient ischemic attack (TIA), and cerebral infarction without residual deficits: Secondary | ICD-10-CM | POA: Diagnosis not present

## 2023-04-07 DIAGNOSIS — I509 Heart failure, unspecified: Secondary | ICD-10-CM | POA: Diagnosis not present

## 2023-04-07 DIAGNOSIS — M1909 Primary osteoarthritis, other specified site: Secondary | ICD-10-CM | POA: Diagnosis not present

## 2023-04-07 DIAGNOSIS — N179 Acute kidney failure, unspecified: Secondary | ICD-10-CM | POA: Diagnosis not present

## 2023-04-07 DIAGNOSIS — I129 Hypertensive chronic kidney disease with stage 1 through stage 4 chronic kidney disease, or unspecified chronic kidney disease: Secondary | ICD-10-CM | POA: Diagnosis not present

## 2023-04-07 DIAGNOSIS — I519 Heart disease, unspecified: Secondary | ICD-10-CM | POA: Diagnosis not present

## 2023-04-07 DIAGNOSIS — G40409 Other generalized epilepsy and epileptic syndromes, not intractable, without status epilepticus: Secondary | ICD-10-CM | POA: Diagnosis not present

## 2023-04-07 DIAGNOSIS — D631 Anemia in chronic kidney disease: Secondary | ICD-10-CM | POA: Diagnosis not present

## 2023-04-07 DIAGNOSIS — E1122 Type 2 diabetes mellitus with diabetic chronic kidney disease: Secondary | ICD-10-CM | POA: Diagnosis not present

## 2023-04-07 DIAGNOSIS — N390 Urinary tract infection, site not specified: Secondary | ICD-10-CM | POA: Diagnosis not present

## 2023-04-07 DIAGNOSIS — I10 Essential (primary) hypertension: Secondary | ICD-10-CM | POA: Diagnosis not present

## 2023-04-07 DIAGNOSIS — G40909 Epilepsy, unspecified, not intractable, without status epilepticus: Secondary | ICD-10-CM | POA: Diagnosis not present

## 2023-04-07 DIAGNOSIS — R6520 Severe sepsis without septic shock: Secondary | ICD-10-CM | POA: Diagnosis not present

## 2023-04-08 DIAGNOSIS — N179 Acute kidney failure, unspecified: Secondary | ICD-10-CM | POA: Diagnosis not present

## 2023-04-08 DIAGNOSIS — N184 Chronic kidney disease, stage 4 (severe): Secondary | ICD-10-CM | POA: Diagnosis not present

## 2023-04-08 DIAGNOSIS — Z792 Long term (current) use of antibiotics: Secondary | ICD-10-CM | POA: Diagnosis not present

## 2023-04-08 DIAGNOSIS — F0390 Unspecified dementia without behavioral disturbance: Secondary | ICD-10-CM | POA: Diagnosis not present

## 2023-04-08 DIAGNOSIS — R5381 Other malaise: Secondary | ICD-10-CM | POA: Diagnosis not present

## 2023-04-08 DIAGNOSIS — E1122 Type 2 diabetes mellitus with diabetic chronic kidney disease: Secondary | ICD-10-CM | POA: Diagnosis not present

## 2023-04-08 DIAGNOSIS — I509 Heart failure, unspecified: Secondary | ICD-10-CM | POA: Diagnosis not present

## 2023-04-08 DIAGNOSIS — N390 Urinary tract infection, site not specified: Secondary | ICD-10-CM | POA: Diagnosis not present

## 2023-04-08 DIAGNOSIS — E039 Hypothyroidism, unspecified: Secondary | ICD-10-CM | POA: Diagnosis not present

## 2023-04-08 DIAGNOSIS — G40909 Epilepsy, unspecified, not intractable, without status epilepticus: Secondary | ICD-10-CM | POA: Diagnosis not present

## 2023-04-08 DIAGNOSIS — I129 Hypertensive chronic kidney disease with stage 1 through stage 4 chronic kidney disease, or unspecified chronic kidney disease: Secondary | ICD-10-CM | POA: Diagnosis not present

## 2023-04-08 DIAGNOSIS — B9689 Other specified bacterial agents as the cause of diseases classified elsewhere: Secondary | ICD-10-CM | POA: Diagnosis not present

## 2023-04-08 DIAGNOSIS — I10 Essential (primary) hypertension: Secondary | ICD-10-CM | POA: Diagnosis not present

## 2023-04-08 DIAGNOSIS — D631 Anemia in chronic kidney disease: Secondary | ICD-10-CM | POA: Diagnosis not present

## 2023-04-08 DIAGNOSIS — G311 Senile degeneration of brain, not elsewhere classified: Secondary | ICD-10-CM | POA: Diagnosis not present

## 2023-04-08 DIAGNOSIS — R7881 Bacteremia: Secondary | ICD-10-CM | POA: Diagnosis not present

## 2023-04-09 DIAGNOSIS — I10 Essential (primary) hypertension: Secondary | ICD-10-CM | POA: Diagnosis not present

## 2023-04-09 DIAGNOSIS — E039 Hypothyroidism, unspecified: Secondary | ICD-10-CM | POA: Diagnosis not present

## 2023-04-09 DIAGNOSIS — I509 Heart failure, unspecified: Secondary | ICD-10-CM | POA: Diagnosis not present

## 2023-04-09 DIAGNOSIS — N184 Chronic kidney disease, stage 4 (severe): Secondary | ICD-10-CM | POA: Diagnosis not present

## 2023-04-09 DIAGNOSIS — D631 Anemia in chronic kidney disease: Secondary | ICD-10-CM | POA: Diagnosis not present

## 2023-04-09 DIAGNOSIS — G311 Senile degeneration of brain, not elsewhere classified: Secondary | ICD-10-CM | POA: Diagnosis not present

## 2023-04-10 DIAGNOSIS — N390 Urinary tract infection, site not specified: Secondary | ICD-10-CM | POA: Diagnosis not present

## 2023-04-10 DIAGNOSIS — R7881 Bacteremia: Secondary | ICD-10-CM | POA: Diagnosis not present

## 2023-04-10 DIAGNOSIS — G311 Senile degeneration of brain, not elsewhere classified: Secondary | ICD-10-CM | POA: Diagnosis not present

## 2023-04-10 DIAGNOSIS — I129 Hypertensive chronic kidney disease with stage 1 through stage 4 chronic kidney disease, or unspecified chronic kidney disease: Secondary | ICD-10-CM | POA: Diagnosis not present

## 2023-04-10 DIAGNOSIS — I10 Essential (primary) hypertension: Secondary | ICD-10-CM | POA: Diagnosis not present

## 2023-04-10 DIAGNOSIS — N179 Acute kidney failure, unspecified: Secondary | ICD-10-CM | POA: Diagnosis not present

## 2023-04-10 DIAGNOSIS — R5381 Other malaise: Secondary | ICD-10-CM | POA: Diagnosis not present

## 2023-04-10 DIAGNOSIS — D631 Anemia in chronic kidney disease: Secondary | ICD-10-CM | POA: Diagnosis not present

## 2023-04-10 DIAGNOSIS — B964 Proteus (mirabilis) (morganii) as the cause of diseases classified elsewhere: Secondary | ICD-10-CM | POA: Diagnosis not present

## 2023-04-10 DIAGNOSIS — N184 Chronic kidney disease, stage 4 (severe): Secondary | ICD-10-CM | POA: Diagnosis not present

## 2023-04-10 DIAGNOSIS — E039 Hypothyroidism, unspecified: Secondary | ICD-10-CM | POA: Diagnosis not present

## 2023-04-10 DIAGNOSIS — G40909 Epilepsy, unspecified, not intractable, without status epilepticus: Secondary | ICD-10-CM | POA: Diagnosis not present

## 2023-04-10 DIAGNOSIS — F0390 Unspecified dementia without behavioral disturbance: Secondary | ICD-10-CM | POA: Diagnosis not present

## 2023-04-10 DIAGNOSIS — I509 Heart failure, unspecified: Secondary | ICD-10-CM | POA: Diagnosis not present

## 2023-04-10 DIAGNOSIS — E1122 Type 2 diabetes mellitus with diabetic chronic kidney disease: Secondary | ICD-10-CM | POA: Diagnosis not present

## 2023-04-11 DIAGNOSIS — N184 Chronic kidney disease, stage 4 (severe): Secondary | ICD-10-CM | POA: Diagnosis not present

## 2023-04-11 DIAGNOSIS — F0390 Unspecified dementia without behavioral disturbance: Secondary | ICD-10-CM | POA: Diagnosis not present

## 2023-04-11 DIAGNOSIS — N179 Acute kidney failure, unspecified: Secondary | ICD-10-CM | POA: Diagnosis not present

## 2023-04-11 DIAGNOSIS — D631 Anemia in chronic kidney disease: Secondary | ICD-10-CM | POA: Diagnosis not present

## 2023-04-11 DIAGNOSIS — I10 Essential (primary) hypertension: Secondary | ICD-10-CM | POA: Diagnosis not present

## 2023-04-11 DIAGNOSIS — E039 Hypothyroidism, unspecified: Secondary | ICD-10-CM | POA: Diagnosis not present

## 2023-04-11 DIAGNOSIS — R5381 Other malaise: Secondary | ICD-10-CM | POA: Diagnosis not present

## 2023-04-11 DIAGNOSIS — B964 Proteus (mirabilis) (morganii) as the cause of diseases classified elsewhere: Secondary | ICD-10-CM | POA: Diagnosis not present

## 2023-04-11 DIAGNOSIS — N390 Urinary tract infection, site not specified: Secondary | ICD-10-CM | POA: Diagnosis not present

## 2023-04-11 DIAGNOSIS — R7881 Bacteremia: Secondary | ICD-10-CM | POA: Diagnosis not present

## 2023-04-11 DIAGNOSIS — E1122 Type 2 diabetes mellitus with diabetic chronic kidney disease: Secondary | ICD-10-CM | POA: Diagnosis not present

## 2023-04-11 DIAGNOSIS — I129 Hypertensive chronic kidney disease with stage 1 through stage 4 chronic kidney disease, or unspecified chronic kidney disease: Secondary | ICD-10-CM | POA: Diagnosis not present

## 2023-04-11 DIAGNOSIS — I509 Heart failure, unspecified: Secondary | ICD-10-CM | POA: Diagnosis not present

## 2023-04-11 DIAGNOSIS — G40909 Epilepsy, unspecified, not intractable, without status epilepticus: Secondary | ICD-10-CM | POA: Diagnosis not present

## 2023-04-11 DIAGNOSIS — G311 Senile degeneration of brain, not elsewhere classified: Secondary | ICD-10-CM | POA: Diagnosis not present

## 2023-04-12 DIAGNOSIS — I129 Hypertensive chronic kidney disease with stage 1 through stage 4 chronic kidney disease, or unspecified chronic kidney disease: Secondary | ICD-10-CM | POA: Diagnosis not present

## 2023-04-12 DIAGNOSIS — I10 Essential (primary) hypertension: Secondary | ICD-10-CM | POA: Diagnosis not present

## 2023-04-12 DIAGNOSIS — N184 Chronic kidney disease, stage 4 (severe): Secondary | ICD-10-CM | POA: Diagnosis not present

## 2023-04-12 DIAGNOSIS — I509 Heart failure, unspecified: Secondary | ICD-10-CM | POA: Diagnosis not present

## 2023-04-12 DIAGNOSIS — N390 Urinary tract infection, site not specified: Secondary | ICD-10-CM | POA: Diagnosis not present

## 2023-04-12 DIAGNOSIS — F0390 Unspecified dementia without behavioral disturbance: Secondary | ICD-10-CM | POA: Diagnosis not present

## 2023-04-12 DIAGNOSIS — A419 Sepsis, unspecified organism: Secondary | ICD-10-CM | POA: Diagnosis not present

## 2023-04-12 DIAGNOSIS — G311 Senile degeneration of brain, not elsewhere classified: Secondary | ICD-10-CM | POA: Diagnosis not present

## 2023-04-12 DIAGNOSIS — G40909 Epilepsy, unspecified, not intractable, without status epilepticus: Secondary | ICD-10-CM | POA: Diagnosis not present

## 2023-04-12 DIAGNOSIS — Z792 Long term (current) use of antibiotics: Secondary | ICD-10-CM | POA: Diagnosis not present

## 2023-04-12 DIAGNOSIS — R5381 Other malaise: Secondary | ICD-10-CM | POA: Diagnosis not present

## 2023-04-12 DIAGNOSIS — E1122 Type 2 diabetes mellitus with diabetic chronic kidney disease: Secondary | ICD-10-CM | POA: Diagnosis not present

## 2023-04-12 DIAGNOSIS — D631 Anemia in chronic kidney disease: Secondary | ICD-10-CM | POA: Diagnosis not present

## 2023-04-12 DIAGNOSIS — E039 Hypothyroidism, unspecified: Secondary | ICD-10-CM | POA: Diagnosis not present

## 2023-04-13 ENCOUNTER — Telehealth: Payer: Self-pay | Admitting: Family Medicine

## 2023-04-13 ENCOUNTER — Other Ambulatory Visit: Payer: Self-pay

## 2023-04-13 ENCOUNTER — Telehealth: Payer: Self-pay

## 2023-04-13 MED ORDER — FREESTYLE LIBRE 2 SENSOR MISC: 3 refills | Status: DC

## 2023-04-13 NOTE — Telephone Encounter (Signed)
Pt called in needs Free style libre 2 sensor sent in to [facility]

## 2023-04-13 NOTE — Telephone Encounter (Signed)
Pt called and requested FreeStyle Libre 2 Sensor Misc  get sent to [facility] in [place]. [facility] does not have in stock to fill.

## 2023-04-13 NOTE — Telephone Encounter (Signed)
Sensor sent to [facility] per patient request

## 2023-04-13 NOTE — Transitions of Care (Post Inpatient/ED Visit) (Signed)
   04/13/2023  Name: ROMANUS OSADA MRN: [id number] DOB: Apr 22, 1931  Today's TOC FU Call Status: Today's TOC FU Call Status:: Unsuccessul Call (1st Attempt) Unsuccessful Call (1st Attempt) Date: 04/13/23  Attempted to reach the patient regarding the most recent Inpatient/ED visit.  Follow Up Plan: Additional outreach attempts will be made to reach the patient to complete the Transitions of Care (Post Inpatient/ED visit) call.   Jodelle Gross, RN, BSN, CCM Care Management Coordinator [facility] Phone: [phone]/Fax: [phone]

## 2023-04-14 ENCOUNTER — Other Ambulatory Visit: Payer: Self-pay

## 2023-04-14 ENCOUNTER — Telehealth: Payer: Self-pay | Admitting: Family Medicine

## 2023-04-14 ENCOUNTER — Telehealth: Payer: Self-pay

## 2023-04-14 MED ORDER — FREESTYLE LIBRE 2 SENSOR MISC: 3 refills | Status: DC

## 2023-04-14 NOTE — Addendum Note (Signed)
Addended by: Jodelle Gross on: 04/14/2023 12:47 PM   Modules accepted: Orders

## 2023-04-14 NOTE — Telephone Encounter (Signed)
Resent to [facility] in [place] per patient request

## 2023-04-14 NOTE — Transitions of Care (Post Inpatient/ED Visit) (Signed)
04/14/2023  Name: Chase [name] MRN: [id number] DOB: 1931-06-24  Today's TOC FU Call Status: Today's TOC FU Call Status:: Successful TOC FU Call Competed TOC FU Call Complete Date: 04/14/23  Transition Care Management Follow-up Telephone Call Date of Discharge: 04/12/23 Discharge Facility: Other ([facility]) Name of Other ([facility]) [facility]charge Facility: [facility] Type of Discharge: Inpatient Admission Primary Inpatient Discharge Diagnosis:: Sepsis How have you been since you were released from the hospital?: Better Any questions or concerns?: No  Items Reviewed: Did you receive and understand the discharge instructions provided?: Yes Medications obtained,verified, and reconciled?: Yes (Medications Reviewed) (Call to pharmacy about Cipro) Any new allergies since your discharge?: No Dietary orders reviewed?: No  Medications Reviewed Today: Medications Reviewed Today     Reviewed by Jodelle Gross, RN (Case Manager) on 04/13/23 at 1115  Med List Status: <None>   Medication Order Taking? Sig Documenting Provider Last Dose Status Informant  amLODipine (NORVASC) 10 MG tablet [id number] No TAKE 1 TABLET DAILY Kerri Perches, MD Taking Active   benzonatate (TESSALON) 100 MG capsule [id number] No Take one capsule once daily, as needed, for congestion Kerri Perches, MD Taking Active   calcium carbonate (TUMS - DOSED IN MG ELEMENTAL CALCIUM) 500 MG chewable tablet [id number]  Chew 0.5 tablets (100 mg of elemental calcium total) by mouth 2 (two) times daily. Kerri Perches, MD  Active   clopidogrel (PLAVIX) 75 MG tablet [id number] No TAKE 1 TABLET DAILY Kerri Perches, MD Taking Active   collagenase [facility]) 250 UNIT/GM ointment [id number] No Apply 1 Application topically daily.  Patient not taking: Reported on 02/15/2023   Kerri Perches, MD Not Taking Active   diphenoxylate-atropine (LOMOTIL) 2.5-0.025 MG tablet [id number] No Take one tablet once daily as  needed, for diarrheah Kerri Perches, MD Taking Active   donepezil (ARICEPT) 5 MG tablet [id number] No Take 5 mg by mouth daily. [provider] Taking Active   famotidine (PEPCID) 20 MG tablet [id number] No Take 20 mg by mouth 2 (two) times daily. [provider] Taking Active   hydrALAZINE (APRESOLINE) 25 MG tablet [id number]  Take 1 tablet (25 mg total) by mouth in the morning and at bedtime. Kerri Perches, MD  Active   hydrOXYzine (ATARAX) 10 MG tablet [id number]  Take 1 tablet (10 mg total) by mouth 3 (three) times daily as needed. Kerri Perches, MD  Active   levETIRAcetam (KEPPRA) 100 MG/ML solution [id number] No Take 5 mLs (500 mg total) by mouth daily. Kerri Perches, MD Taking Active   levothyroxine (SYNTHROID) 112 MCG tablet [id number] No TAKE 1 TABLET DAILY BEFORE BREAKFAST Dani Gobble, NP Taking Active   mirtazapine (REMERON) 7.5 MG tablet [id number]  Take 1 tablet (7.5 mg total) by mouth at bedtime. Kerri Perches, MD  Active   montelukast (SINGULAIR) 10 MG tablet [id number]  Take 1 tablet (10 mg total) by mouth at bedtime. Kerri Perches, MD  Active   Multiple Vitamins-Minerals (MULTIVITAMIN WITH MINERALS) tablet [id number] No Take 1 tablet by mouth daily. [provider] Taking Active Spouse/Significant Other  Nystatin (GERHARDT'S BUTT CREAM) CREA [id number] No Apply sparingly twice daily to affected area Kerri Perches, MD Taking Active   olopatadine (PATANOL) 0.1 % ophthalmic solution [id number]  INSTILL 1 DROP INTO [facility] EYE TWICE DAILY Kerri Perches, MD  Active   ondansetron (ZOFRAN-ODT) 8 MG disintegrating tablet [id number] No Take 8 mg by mouth every 8 (eight) hours as needed. [provider] Taking Active   polyethylene glycol powder (GLYCOLAX/MIRALAX) 17 GM/SCOOP powder [id number] No Take 17 g by mouth daily.  Patient taking differently: Take 17 g by mouth daily as needed.   Kerri Perches, MD Taking  Active   pravastatin (PRAVACHOL) 20 MG tablet [id number] No TAKE 1 TABLET DAILY Kerri Perches, MD Taking Active   sodium bicarbonate 650 MG tablet [id number] No Take 650 mg by mouth 3 (three) times daily. [provider] Taking Active   tamsulosin (FLOMAX) 0.4 MG CAPS capsule [id number] No Take 1 capsule (0.4 mg total) by mouth daily. Kerri Perches, MD Taking Active   UNABLE TO FIND [id number] No Med Name: Patient needs assistance with transferring and ADLS and needs an in home care nurse. Patient is unable to stand on his own or help stand. Kerri Perches, MD Taking Active   UNABLE TO FIND [id number] No Med Name: Alda Ponder- use as needed Kerri Perches, MD Taking Active             Home Care and Equipment/Supplies: Were Home Health Services Ordered?: No Has Agency set up a time to come to your home?: No Any new equipment or medical supplies ordered?: No  Functional Questionnaire: Do you need assistance with bathing/showering or dressing?: Yes Do you need assistance with meal preparation?: Yes Do you need assistance with eating?: Yes Do you have difficulty maintaining continence: No Do you need assistance with getting out of bed/getting out of a chair/moving?: Yes Do you have difficulty managing or taking your medications?: Yes  Follow up appointments reviewed: PCP Follow-up appointment confirmed?: Yes Date of PCP follow-up appointment?: 04/21/23 Follow-up Provider: Dr. Lodema Hong Specialist [facility] Follow-up appointment confirmed?: NA Do you need transportation to your follow-up appointment?: No Do you understand care options if your condition(s) worsen?: Yes-patient verbalized understanding  SDOH Interventions Today    Flowsheet Row Most Recent Value  SDOH Interventions   Food Insecurity Interventions Intervention Not Indicated  Housing Interventions Intervention Not Indicated  Transportation Interventions Intervention Not Indicated  Financial  Strain Interventions Intervention Not Indicated      Interventions Today    Flowsheet Row Most Recent Value  Chronic Disease   Chronic disease during today's visit Diabetes, Other  [Sepsis]  General Interventions   General Interventions Discussed/Reviewed General Interventions Discussed  Pharmacy Interventions   Pharmacy Dicussed/Reviewed Pharmacy Topics Discussed  Otis Dials to [facility] as patient's wife did not know he was suppose to be on Cipro.  Verified the prescription is at pharmacy and called Mrs. Riviere back.]       TOC Interventions Today    Flowsheet Row Most Recent Value  TOC Interventions   TOC Interventions Discussed/Reviewed TOC Interventions Discussed, TOC Interventions Reviewed       Jodelle Gross, RN, BSN, CCM Care Management Coordinator [place] Phone: [phone]/Fax: [phone]

## 2023-04-14 NOTE — Telephone Encounter (Signed)
Called in regard to [facility] forms. Has not heard anything back in regard.  Forms dropped off on 03/16/23

## 2023-04-15 DIAGNOSIS — Z0279 Encounter for issue of other medical certificate: Secondary | ICD-10-CM

## 2023-04-15 NOTE — Telephone Encounter (Signed)
eror

## 2023-04-16 NOTE — Telephone Encounter (Signed)
Forms completed will be ready once charges are entered.

## 2023-04-19 DIAGNOSIS — G311 Senile degeneration of brain, not elsewhere classified: Secondary | ICD-10-CM | POA: Diagnosis not present

## 2023-04-19 DIAGNOSIS — E039 Hypothyroidism, unspecified: Secondary | ICD-10-CM | POA: Diagnosis not present

## 2023-04-19 DIAGNOSIS — D631 Anemia in chronic kidney disease: Secondary | ICD-10-CM | POA: Diagnosis not present

## 2023-04-19 DIAGNOSIS — I509 Heart failure, unspecified: Secondary | ICD-10-CM | POA: Diagnosis not present

## 2023-04-19 DIAGNOSIS — N184 Chronic kidney disease, stage 4 (severe): Secondary | ICD-10-CM | POA: Diagnosis not present

## 2023-04-19 DIAGNOSIS — I10 Essential (primary) hypertension: Secondary | ICD-10-CM | POA: Diagnosis not present

## 2023-04-20 ENCOUNTER — Telehealth: Payer: Self-pay

## 2023-04-20 DIAGNOSIS — E039 Hypothyroidism, unspecified: Secondary | ICD-10-CM | POA: Diagnosis not present

## 2023-04-20 DIAGNOSIS — G311 Senile degeneration of brain, not elsewhere classified: Secondary | ICD-10-CM | POA: Diagnosis not present

## 2023-04-20 DIAGNOSIS — I10 Essential (primary) hypertension: Secondary | ICD-10-CM | POA: Diagnosis not present

## 2023-04-20 DIAGNOSIS — D631 Anemia in chronic kidney disease: Secondary | ICD-10-CM | POA: Diagnosis not present

## 2023-04-20 DIAGNOSIS — I509 Heart failure, unspecified: Secondary | ICD-10-CM | POA: Diagnosis not present

## 2023-04-20 DIAGNOSIS — N184 Chronic kidney disease, stage 4 (severe): Secondary | ICD-10-CM | POA: Diagnosis not present

## 2023-04-20 NOTE — Telephone Encounter (Signed)
[facility] aware forms are ready for pick up .

## 2023-04-20 NOTE — Telephone Encounter (Signed)
Provider completed FMLA for patient. Ready for pickup.  Copy made and put in scan box  Form faxed to [phone]  Charge dropped for form

## 2023-04-21 ENCOUNTER — Encounter: Payer: Self-pay | Admitting: Family Medicine

## 2023-04-21 ENCOUNTER — Ambulatory Visit (INDEPENDENT_AMBULATORY_CARE_PROVIDER_SITE_OTHER): Payer: Medicare Other | Admitting: Family Medicine

## 2023-04-21 VITALS — BP 135/77 | HR 64

## 2023-04-21 DIAGNOSIS — J3489 Other specified disorders of nose and nasal sinuses: Secondary | ICD-10-CM | POA: Diagnosis not present

## 2023-04-21 DIAGNOSIS — E118 Type 2 diabetes mellitus with unspecified complications: Secondary | ICD-10-CM

## 2023-04-21 DIAGNOSIS — Z7689 Persons encountering health services in other specified circumstances: Secondary | ICD-10-CM | POA: Insufficient documentation

## 2023-04-21 DIAGNOSIS — A419 Sepsis, unspecified organism: Secondary | ICD-10-CM | POA: Diagnosis not present

## 2023-04-21 DIAGNOSIS — L98421 Non-pressure chronic ulcer of back limited to breakdown of skin: Secondary | ICD-10-CM | POA: Insufficient documentation

## 2023-04-21 MED ORDER — MUPIROCIN 2 % EX OINT: TOPICAL_OINTMENT | CUTANEOUS | 0 refills | Status: DC

## 2023-04-21 NOTE — Assessment & Plan Note (Signed)
Patient in for follow up of recent hospitalization. Discharge summary, and laboratory and radiology data are reviewed, and any questions or concerns  are discussed. Specific issues requiring follow up are specifically addressed.  

## 2023-04-21 NOTE — Patient Instructions (Signed)
F/U in 4 months, call if you need me sooner  Bactroban is prescribed for sore in nostril  Try using gloves to reduce the picking that  Chase [name] is doing  Nurse please send rx for CBG supplies  [facility] for price [facility], family says [place] is expensive  Ulcer on buttock will heal with the carew he is getting  Thanks for choosing [facility], we consider it a privelige to serve you.

## 2023-04-21 NOTE — Assessment & Plan Note (Signed)
Topical bactroban twice daily for 1 week then as needed, suggested putting on gloves to reduce repeated picking at nostril

## 2023-04-21 NOTE — Progress Notes (Signed)
   MIHAEL GLYNN     MRN: [id number]      DOB: September 04, 1931  Chief Complaint  Patient presents with   Follow-up    Follow up patients family reports dementia getting worse catheter now     HPI Mr. Bea is here for [facility] visit, he was hospitalized from 4/30 to 04/12/2023, because of sepsis. Overall stable, new concern that his sacral ulcer worsened significantly while hospital and there eias now a ":deep hole" in the area. He c/o sacral pain Appetite poor No c/o bejhavioral issues at visit Hospital record reviewed He has appt with nephrology in am  Family c/o repeatedly picking his nostril then rubbing his eyes, has eye drops requesting medication for nostril ROS Denies recent fever or chills. Denies sinus pressure, nasal congestion, ear pain or sore throat. Denies chest congestion, productive cough or wheezing. Denies chest pains, palpitations and leg swelling Denies abdominal pain, nausea, vomiting,diarrhea or constipation.   Denies dysuria, frequency, hesitancy or incontinence. C/o generalized joint pain and is non ambulatory   PE  BP 135/77 (BP Location: Right Arm, Patient Position: Sitting, Cuff Size: Large)   Pulse 64   SpO2 98%   Patient alert ad and in no cardiopulmonary distress.  HEENT: No facial asymmetry, EOMI,     Neck supple .  Chest: Clear to auscultation bilaterally.  CVS: S1, S2 no murmurs, no S3.Regular rate.  ABD: Soft non tender.   Ext: No edema  MS: decreased  ROM spine, shoulders, hips and knees.  Skin: healing sacral ul;cer max doiiameter approx 5 cm, no erythema drainage or deep open wound visible on exam  Psych: Good eye contact, . not anxious or depressed appearing.  CNS: CN 2-12 intact, .no focal deficits noted.   Assessment & Plan  Sepsis (HCC) Resolved, will; complete last antibiotic in am, no fever or chills at home, no antibiotic associated diarrheah  Encounter for support and coordination of transition of care Patient in for follow  up of recent hospitalization. Discharge summary, and laboratory and radiology data are reviewed, and any questions or concerns  are discussed. Specific issues requiring follow up are specifically addressed.   Nasal sore Topical bactroban twice daily for 1 week then as needed, suggested putting on gloves to reduce repeated picking at nostril  Sacral ulcer, limited to breakdown of skin (HCC) Continue aggressive wound care, ulcer is healing , no obvious infection at site, diameter approx 5 cm

## 2023-04-21 NOTE — Assessment & Plan Note (Signed)
Continue aggressive wound care, ulcer is healing , no obvious infection at site, diameter approx 5 cm

## 2023-04-21 NOTE — Assessment & Plan Note (Signed)
Resolved, will; complete last antibiotic in am, no fever or chills at home, no antibiotic associated diarrheah

## 2023-04-22 DIAGNOSIS — N2581 Secondary hyperparathyroidism of renal origin: Secondary | ICD-10-CM | POA: Diagnosis not present

## 2023-04-22 DIAGNOSIS — N184 Chronic kidney disease, stage 4 (severe): Secondary | ICD-10-CM | POA: Diagnosis not present

## 2023-04-22 DIAGNOSIS — E875 Hyperkalemia: Secondary | ICD-10-CM | POA: Diagnosis not present

## 2023-04-22 DIAGNOSIS — R809 Proteinuria, unspecified: Secondary | ICD-10-CM | POA: Diagnosis not present

## 2023-04-23 DIAGNOSIS — N184 Chronic kidney disease, stage 4 (severe): Secondary | ICD-10-CM | POA: Diagnosis not present

## 2023-04-23 DIAGNOSIS — I10 Essential (primary) hypertension: Secondary | ICD-10-CM | POA: Diagnosis not present

## 2023-04-23 DIAGNOSIS — D631 Anemia in chronic kidney disease: Secondary | ICD-10-CM | POA: Diagnosis not present

## 2023-04-23 DIAGNOSIS — I509 Heart failure, unspecified: Secondary | ICD-10-CM | POA: Diagnosis not present

## 2023-04-23 DIAGNOSIS — G311 Senile degeneration of brain, not elsewhere classified: Secondary | ICD-10-CM | POA: Diagnosis not present

## 2023-04-23 DIAGNOSIS — E039 Hypothyroidism, unspecified: Secondary | ICD-10-CM | POA: Diagnosis not present

## 2023-04-25 DIAGNOSIS — E039 Hypothyroidism, unspecified: Secondary | ICD-10-CM | POA: Diagnosis not present

## 2023-04-25 DIAGNOSIS — I10 Essential (primary) hypertension: Secondary | ICD-10-CM | POA: Diagnosis not present

## 2023-04-25 DIAGNOSIS — D631 Anemia in chronic kidney disease: Secondary | ICD-10-CM | POA: Diagnosis not present

## 2023-04-25 DIAGNOSIS — N184 Chronic kidney disease, stage 4 (severe): Secondary | ICD-10-CM | POA: Diagnosis not present

## 2023-04-25 DIAGNOSIS — G311 Senile degeneration of brain, not elsewhere classified: Secondary | ICD-10-CM | POA: Diagnosis not present

## 2023-04-25 DIAGNOSIS — I509 Heart failure, unspecified: Secondary | ICD-10-CM | POA: Diagnosis not present

## 2023-04-26 DIAGNOSIS — E039 Hypothyroidism, unspecified: Secondary | ICD-10-CM | POA: Diagnosis not present

## 2023-04-26 DIAGNOSIS — I10 Essential (primary) hypertension: Secondary | ICD-10-CM | POA: Diagnosis not present

## 2023-04-26 DIAGNOSIS — D631 Anemia in chronic kidney disease: Secondary | ICD-10-CM | POA: Diagnosis not present

## 2023-04-26 DIAGNOSIS — N184 Chronic kidney disease, stage 4 (severe): Secondary | ICD-10-CM | POA: Diagnosis not present

## 2023-04-26 DIAGNOSIS — G311 Senile degeneration of brain, not elsewhere classified: Secondary | ICD-10-CM | POA: Diagnosis not present

## 2023-04-26 DIAGNOSIS — I509 Heart failure, unspecified: Secondary | ICD-10-CM | POA: Diagnosis not present

## 2023-04-28 DIAGNOSIS — I10 Essential (primary) hypertension: Secondary | ICD-10-CM | POA: Diagnosis not present

## 2023-04-28 DIAGNOSIS — D631 Anemia in chronic kidney disease: Secondary | ICD-10-CM | POA: Diagnosis not present

## 2023-04-28 DIAGNOSIS — E039 Hypothyroidism, unspecified: Secondary | ICD-10-CM | POA: Diagnosis not present

## 2023-04-28 DIAGNOSIS — I509 Heart failure, unspecified: Secondary | ICD-10-CM | POA: Diagnosis not present

## 2023-04-28 DIAGNOSIS — N184 Chronic kidney disease, stage 4 (severe): Secondary | ICD-10-CM | POA: Diagnosis not present

## 2023-04-28 DIAGNOSIS — G311 Senile degeneration of brain, not elsewhere classified: Secondary | ICD-10-CM | POA: Diagnosis not present

## 2023-04-29 DIAGNOSIS — E039 Hypothyroidism, unspecified: Secondary | ICD-10-CM | POA: Diagnosis not present

## 2023-04-29 DIAGNOSIS — I509 Heart failure, unspecified: Secondary | ICD-10-CM | POA: Diagnosis not present

## 2023-04-29 DIAGNOSIS — I10 Essential (primary) hypertension: Secondary | ICD-10-CM | POA: Diagnosis not present

## 2023-04-29 DIAGNOSIS — G311 Senile degeneration of brain, not elsewhere classified: Secondary | ICD-10-CM | POA: Diagnosis not present

## 2023-04-29 DIAGNOSIS — N184 Chronic kidney disease, stage 4 (severe): Secondary | ICD-10-CM | POA: Diagnosis not present

## 2023-04-29 DIAGNOSIS — D631 Anemia in chronic kidney disease: Secondary | ICD-10-CM | POA: Diagnosis not present

## 2023-04-30 DIAGNOSIS — I509 Heart failure, unspecified: Secondary | ICD-10-CM | POA: Diagnosis not present

## 2023-04-30 DIAGNOSIS — G311 Senile degeneration of brain, not elsewhere classified: Secondary | ICD-10-CM | POA: Diagnosis not present

## 2023-04-30 DIAGNOSIS — E039 Hypothyroidism, unspecified: Secondary | ICD-10-CM | POA: Diagnosis not present

## 2023-04-30 DIAGNOSIS — I10 Essential (primary) hypertension: Secondary | ICD-10-CM | POA: Diagnosis not present

## 2023-04-30 DIAGNOSIS — N184 Chronic kidney disease, stage 4 (severe): Secondary | ICD-10-CM | POA: Diagnosis not present

## 2023-04-30 DIAGNOSIS — D631 Anemia in chronic kidney disease: Secondary | ICD-10-CM | POA: Diagnosis not present

## 2023-05-01 DIAGNOSIS — D631 Anemia in chronic kidney disease: Secondary | ICD-10-CM | POA: Diagnosis not present

## 2023-05-01 DIAGNOSIS — I509 Heart failure, unspecified: Secondary | ICD-10-CM | POA: Diagnosis not present

## 2023-05-01 DIAGNOSIS — N184 Chronic kidney disease, stage 4 (severe): Secondary | ICD-10-CM | POA: Diagnosis not present

## 2023-05-01 DIAGNOSIS — I10 Essential (primary) hypertension: Secondary | ICD-10-CM | POA: Diagnosis not present

## 2023-05-01 DIAGNOSIS — E039 Hypothyroidism, unspecified: Secondary | ICD-10-CM | POA: Diagnosis not present

## 2023-05-01 DIAGNOSIS — G311 Senile degeneration of brain, not elsewhere classified: Secondary | ICD-10-CM | POA: Diagnosis not present

## 2023-05-02 DIAGNOSIS — N184 Chronic kidney disease, stage 4 (severe): Secondary | ICD-10-CM | POA: Diagnosis not present

## 2023-05-02 DIAGNOSIS — E039 Hypothyroidism, unspecified: Secondary | ICD-10-CM | POA: Diagnosis not present

## 2023-05-02 DIAGNOSIS — I509 Heart failure, unspecified: Secondary | ICD-10-CM | POA: Diagnosis not present

## 2023-05-02 DIAGNOSIS — G311 Senile degeneration of brain, not elsewhere classified: Secondary | ICD-10-CM | POA: Diagnosis not present

## 2023-05-02 DIAGNOSIS — I10 Essential (primary) hypertension: Secondary | ICD-10-CM | POA: Diagnosis not present

## 2023-05-02 DIAGNOSIS — D631 Anemia in chronic kidney disease: Secondary | ICD-10-CM | POA: Diagnosis not present

## 2023-05-03 DIAGNOSIS — I509 Heart failure, unspecified: Secondary | ICD-10-CM | POA: Diagnosis not present

## 2023-05-03 DIAGNOSIS — N184 Chronic kidney disease, stage 4 (severe): Secondary | ICD-10-CM | POA: Diagnosis not present

## 2023-05-03 DIAGNOSIS — G311 Senile degeneration of brain, not elsewhere classified: Secondary | ICD-10-CM | POA: Diagnosis not present

## 2023-05-03 DIAGNOSIS — I10 Essential (primary) hypertension: Secondary | ICD-10-CM | POA: Diagnosis not present

## 2023-05-03 DIAGNOSIS — D631 Anemia in chronic kidney disease: Secondary | ICD-10-CM | POA: Diagnosis not present

## 2023-05-03 DIAGNOSIS — E039 Hypothyroidism, unspecified: Secondary | ICD-10-CM | POA: Diagnosis not present

## 2023-05-04 DIAGNOSIS — E039 Hypothyroidism, unspecified: Secondary | ICD-10-CM | POA: Diagnosis not present

## 2023-05-04 DIAGNOSIS — I509 Heart failure, unspecified: Secondary | ICD-10-CM | POA: Diagnosis not present

## 2023-05-04 DIAGNOSIS — I10 Essential (primary) hypertension: Secondary | ICD-10-CM | POA: Diagnosis not present

## 2023-05-04 DIAGNOSIS — G311 Senile degeneration of brain, not elsewhere classified: Secondary | ICD-10-CM | POA: Diagnosis not present

## 2023-05-04 DIAGNOSIS — N184 Chronic kidney disease, stage 4 (severe): Secondary | ICD-10-CM | POA: Diagnosis not present

## 2023-05-04 DIAGNOSIS — D631 Anemia in chronic kidney disease: Secondary | ICD-10-CM | POA: Diagnosis not present

## 2023-05-06 ENCOUNTER — Ambulatory Visit (INDEPENDENT_AMBULATORY_CARE_PROVIDER_SITE_OTHER): Payer: Medicare Other | Admitting: *Deleted

## 2023-05-06 DIAGNOSIS — I10 Essential (primary) hypertension: Secondary | ICD-10-CM

## 2023-05-06 DIAGNOSIS — N184 Chronic kidney disease, stage 4 (severe): Secondary | ICD-10-CM

## 2023-05-06 DIAGNOSIS — Z794 Long term (current) use of insulin: Secondary | ICD-10-CM

## 2023-05-06 NOTE — Chronic Care Management (AMB) (Signed)
Chronic Care Management   CCM RN Visit Note  05/06/2023 Name: Chase [name] MRN: [id number] DOB: 1931/03/29  Subjective: Chase [name] is a 89 y.o. year old male who is a primary care patient of Kerri Perches, MD. The patient was referred to the Chronic Care Management team for assistance with care management needs subsequent to provider initiation of CCM services and plan of care.    Today's Visit:  Engaged with patient by telephone for follow up visit.        Goals Addressed             This Visit's Progress    CCM (CHRONIC KIDNEY DISEASE) EXPECTED OUTCOME: MONITOR, SELF-MANAGE AND REDUCE SYMPTOMS OF CHRONIC KIDNEY DISEASE       Current Barriers:  Knowledge Deficits related to CKD stage 4 Chronic Disease Management support and education needs related to CKD stage 4 Cognitive Deficits No Advanced Directives in place- family declines Patient's spouse reports patient's appetite is not always good and sometimes he does not want to eat, pt continues to supplement with Boost and/ or Glucerna and does well with this, trying to eat as healthy as possible to aid in wound healing  Planned Interventions: Evaluation of current treatment plan related to chronic kidney disease self management and patient's adherence to plan as established by provider      Reviewed medications with patient and discussed importance of compliance    Discussed complications of poorly controlled blood pressure such as heart disease, stroke, circulatory complications, vision complications, kidney impairment, sexual dysfunction    Reviewed scheduled/upcoming provider appointments including    Advised patient to discuss change in health status/ appetite  with provider    Discussed the impact of chronic kidney disease on daily life and mental health and acknowledged and normalized feelings of disempowerment, fear, and frustration    Support coping and stress management by recognizing current strategies and  assist in developing new strategies such as mindfulness, journaling, relaxation techniques, problem-solving    Reviewed importance of eating small portions of protein foods Reviewed choosing water to drink and offering to pt throughout the day Reinforced importance of changing positions q 2 hours to prevent skin breakdown  Symptom Management: Take medications as prescribed   Attend all scheduled provider appointments Call pharmacy for medication refills 3-7 days in advance of running out of medications Call provider office for new concerns or questions  Choose water as main beverage Offer food and beverage to patient throughout the day Read labels for sodium content/ avoid salty snacks and fast food Eat small portions of protein foods Change patient's positions q 2 hours Keep patient clean and dry please keep in touch with your primary care provider and report any changes in health status/ symptoms  Follow Up Plan: Telephone follow up appointment with care management team member scheduled for:   07/01/23 at 215 pm       CCM (DIABETES) EXPECTED OUTCOME:  MONITOR, SELF-MANAGE AND REDUCE SYMPTOMS OF DIABETES       Current Barriers:  Knowledge Deficits related to Diabetes Chronic Disease Management support and education needs related to Diabetes and diet Cognitive Deficits No Advanced Directives in place- family declines Per spouse, CBG fasting ranges are 96-121, recent random reading 231, uses Freestyle Libre to monitor, reports pt has all medications and taking as prescribed Spouse reports there is someone there every evening to help get pt in the bed, reports they now have assistance in the home M-F 4 hours per  day and this has been very helpful Spouse reports wound to right foot is healed and "one on buttocks got worse in the hospital but now it's healing, looking better" Spouse reports home health continues and provides oversight for wound care/ progress of wound healing  Planned  Interventions: Reviewed medications with patient and discussed importance of medication adherence;        Counseled on importance of regular laboratory monitoring as prescribed;        Advised patient, providing education and rationale, to check cbg CGM  and record        call provider for findings outside established parameters;       Review of patient status, including review of consultants reports, relevant laboratory and other test results, and medications completed;       Advised patient to discuss any issues with blood sugar, medications, overall health with provider;      Reviewed importance of good blood sugar control for wound healing Reviewed that wound or infection can elevate blood sugar Reviewed signs/ symptoms of infection Reviewed carbohydrate modified diet  Symptom Management: Take medications as prescribed   Attend all scheduled provider appointments Call pharmacy for medication refills 3-7 days in advance of running out of medications Call provider office for new concerns or questions  check blood sugar at prescribed times: Freestyle Libre  check feet daily for cuts, sores or redness take the blood sugar log to all doctor visits take the blood sugar meter to all doctor visits trim toenails straight across fill half of plate with vegetables limit fast food meals to no more than 1 per week prepare main meal at home 3 to 5 days each week read food labels for fat, fiber, carbohydrates and portion size set a realistic goal keep feet up while sitting wash and dry feet carefully every day Report any new symptoms/ change in health status to your doctor  Report any worsening of wounds to your doctor Continue working with home health for dressing changes, wound care  Change positions every 2 hours  Follow Up Plan: Telephone follow up appointment with care management team member scheduled for:    07/01/23 at 215 pm       CCM (HYPERTENSION) EXPECTED OUTCOME: MONITOR,  SELF-MANAGE AND REDUCE SYMPTOMS OF HYPERTENSION       Current Barriers:  Knowledge Deficits related to Hypertension Chronic Disease Management support and education needs related to Hypertension, diet Cognitive Deficits No Advanced Directives in place- family declines Spoke with patient's wife Varner Cancilla, patient's adult daughters are very active in the care of pt, pt has aide M-F 4 hours per day, pt has WC, walker, cane, patient has dementia and is on aricept, is now hydroxyzine prn for agitation, family does not monitor blood pressure and states palliative nurse no longer sees pt (per family request), pt has ulcer on lower buttock / sacral area and "looking better" per spouse and right foot wound is completely healed per spouse, caregiver has been dressing this area, family states it is very difficult to get patient to change positions q 2 hours, there is now a caregiver who comes in at night to get pt into bed and this is working out well.    Planned Interventions: Evaluation of current treatment plan related to hypertension self management and patient's adherence to plan as established by provider;   Reviewed medications with patient and discussed importance of compliance;  Discussed plans with patient for ongoing care management follow up and provided patient  with direct contact information for care management team; Advised patient, providing education and rationale, to monitor blood pressure daily and record, calling PCP for findings outside established parameters;  Advised patient to discuss worsening would status with provider; Discussed complications of poorly controlled blood pressure such as heart disease, stroke, circulatory complications, vision complications, kidney impairment, sexual dysfunction;  Reviewed importance of changing positions q 2 hours Reviewed importance of good nutrition Reviewed signs/ symptoms of infection Reviwed low sodium diet  Symptom Management: Take  medications as prescribed   Attend all scheduled provider appointments Call pharmacy for medication refills 3-7 days in advance of running out of medications Perform all self care activities independently  Call provider office for new concerns or questions  check blood pressure weekly choose a place to take my blood pressure (home, clinic or office, retail store) write blood pressure results in a log or diary learn about high blood pressure keep a blood pressure log take blood pressure log to all doctor appointments call doctor for signs and symptoms of high blood pressure keep all doctor appointments take medications for blood pressure exactly as prescribed report new symptoms to your doctor eat more whole grains, fruits and vegetables, lean meats and healthy fats Follow low sodium diet- read labels Change positions every 2 hours- this is very important Please call doctor if wound to bottom looks worse (redness, increased drainage, odor, fever) Continue to work with home health Report new findings to your primary care provider  Follow Up Plan: Telephone follow up appointment with care management team member scheduled for:   07/01/23 at 215 pm          Plan:Telephone follow up appointment with care management team member scheduled for:  07/01/23 at 215 pm  Irving Shows [facility], BSN RN Case Manager [facility] [phone]

## 2023-05-06 NOTE — Patient Instructions (Signed)
Please call the care guide team at [phone] if you need to cancel or reschedule your appointment.   If you are experiencing a Mental Health or Behavioral Health Crisis or need someone to talk to, please call the Suicide and Crisis Lifeline: 988 call the [place] National Suicide Prevention Lifeline: 225-579-3965 or TTY: 706 659 2346 TTY 906-647-5080) to talk to a trained counselor call 1-800-273-TALK (toll free, 24 hour hotline) go to [facility] [address] [place] [phone]) call the [facility]: [phone] call 911   Following is a copy of the CCM Program Consent:  CCM service includes personalized support from designated clinical staff supervised by the physician, including individualized plan of care and coordination with other care providers 24/7 contact phone numbers for assistance for urgent and routine care needs. Service will only be billed when office clinical staff spend 20 minutes or more in a month to coordinate care. Only one practitioner may furnish and bill the service in a calendar month. The patient may stop CCM services at amy time (effective at the end of the month) by phone call to the office staff. The patient will be responsible for cost sharing (co-pay) or up to 20% of the service fee (after annual deductible is met)  Following is a copy of your full provider care plan:   Goals Addressed             This Visit's Progress    CCM (CHRONIC KIDNEY DISEASE) EXPECTED OUTCOME: MONITOR, SELF-MANAGE AND REDUCE SYMPTOMS OF CHRONIC KIDNEY DISEASE       Current Barriers:  Knowledge Deficits related to CKD stage 4 Chronic Disease Management support and education needs related to CKD stage 4 Cognitive Deficits No Advanced Directives in place- family declines Patient's spouse reports patient's appetite is not always good and sometimes he does not want to eat, pt continues to supplement with Boost and/ or  Glucerna and does well with this, trying to eat as healthy as possible to aid in wound healing  Planned Interventions: Evaluation of current treatment plan related to chronic kidney disease self management and patient's adherence to plan as established by provider      Reviewed medications with patient and discussed importance of compliance    Discussed complications of poorly controlled blood pressure such as heart disease, stroke, circulatory complications, vision complications, kidney impairment, sexual dysfunction    Reviewed scheduled/upcoming provider appointments including    Advised patient to discuss change in health status/ appetite  with provider    Discussed the impact of chronic kidney disease on daily life and mental health and acknowledged and normalized feelings of disempowerment, fear, and frustration    Support coping and stress management by recognizing current strategies and assist in developing new strategies such as mindfulness, journaling, relaxation techniques, problem-solving    Reviewed importance of eating small portions of protein foods Reviewed choosing water to drink and offering to pt throughout the day Reinforced importance of changing positions q 2 hours to prevent skin breakdown  Symptom Management: Take medications as prescribed   Attend all scheduled provider appointments Call pharmacy for medication refills 3-7 days in advance of running out of medications Call provider office for new concerns or questions  Choose water as main beverage Offer food and beverage to patient throughout the day Read labels for sodium content/ avoid salty snacks and fast food Eat small portions of protein foods Change patient's positions q 2 hours Keep patient clean and dry please keep in  touch with your primary care provider and report any changes in health status/ symptoms  Follow Up Plan: Telephone follow up appointment with care management team member scheduled for:    07/01/23 at 215 pm       CCM (DIABETES) EXPECTED OUTCOME:  MONITOR, SELF-MANAGE AND REDUCE SYMPTOMS OF DIABETES       Current Barriers:  Knowledge Deficits related to Diabetes Chronic Disease Management support and education needs related to Diabetes and diet Cognitive Deficits No Advanced Directives in place- family declines Per spouse, CBG fasting ranges are 96-121, recent random reading 231, uses Freestyle Libre to monitor, reports pt has all medications and taking as prescribed Spouse reports there is someone there every evening to help get pt in the bed, reports they now have assistance in the home M-F 4 hours per day and this has been very helpful Spouse reports wound to right foot is healed and "one on buttocks got worse in the hospital but now it's healing, looking better" Spouse reports home health continues and provides oversight for wound care/ progress of wound healing  Planned Interventions: Reviewed medications with patient and discussed importance of medication adherence;        Counseled on importance of regular laboratory monitoring as prescribed;        Advised patient, providing education and rationale, to check cbg CGM  and record        call provider for findings outside established parameters;       Review of patient status, including review of consultants reports, relevant laboratory and other test results, and medications completed;       Advised patient to discuss any issues with blood sugar, medications, overall health with provider;      Reviewed importance of good blood sugar control for wound healing Reviewed that wound or infection can elevate blood sugar Reviewed signs/ symptoms of infection Reviewed carbohydrate modified diet  Symptom Management: Take medications as prescribed   Attend all scheduled provider appointments Call pharmacy for medication refills 3-7 days in advance of running out of medications Call provider office for new concerns or questions   check blood sugar at prescribed times: Freestyle Libre  check feet daily for cuts, sores or redness take the blood sugar log to all doctor visits take the blood sugar meter to all doctor visits trim toenails straight across fill half of plate with vegetables limit fast food meals to no more than 1 per week prepare main meal at home 3 to 5 days each week read food labels for fat, fiber, carbohydrates and portion size set a realistic goal keep feet up while sitting wash and dry feet carefully every day Report any new symptoms/ change in health status to your doctor  Report any worsening of wounds to your doctor Continue working with home health for dressing changes, wound care  Change positions every 2 hours  Follow Up Plan: Telephone follow up appointment with care management team member scheduled for:    07/01/23 at 215 pm       CCM (HYPERTENSION) EXPECTED OUTCOME: MONITOR, SELF-MANAGE AND REDUCE SYMPTOMS OF HYPERTENSION       Current Barriers:  Knowledge Deficits related to Hypertension Chronic Disease Management support and education needs related to Hypertension, diet Cognitive Deficits No Advanced Directives in place- family declines Spoke with patient's wife Travious Bourne, patient's adult daughters are very active in the care of pt, pt has aide M-F 4 hours per day, pt has WC, walker, cane, patient has dementia and  is on aricept, is now hydroxyzine prn for agitation, family does not monitor blood pressure and states palliative nurse no longer sees pt (per family request), pt has ulcer on lower buttock / sacral area and "looking better" per spouse and right foot wound is completely healed per spouse, caregiver has been dressing this area, family states it is very difficult to get patient to change positions q 2 hours, there is now a caregiver who comes in at night to get pt into bed and this is working out well.    Planned Interventions: Evaluation of current treatment plan related to  hypertension self management and patient's adherence to plan as established by provider;   Reviewed medications with patient and discussed importance of compliance;  Discussed plans with patient for ongoing care management follow up and provided patient with direct contact information for care management team; Advised patient, providing education and rationale, to monitor blood pressure daily and record, calling PCP for findings outside established parameters;  Advised patient to discuss worsening would status with provider; Discussed complications of poorly controlled blood pressure such as heart disease, stroke, circulatory complications, vision complications, kidney impairment, sexual dysfunction;  Reviewed importance of changing positions q 2 hours Reviewed importance of good nutrition Reviewed signs/ symptoms of infection Reviwed low sodium diet  Symptom Management: Take medications as prescribed   Attend all scheduled provider appointments Call pharmacy for medication refills 3-7 days in advance of running out of medications Perform all self care activities independently  Call provider office for new concerns or questions  check blood pressure weekly choose a place to take my blood pressure (home, clinic or office, retail store) write blood pressure results in a log or diary learn about high blood pressure keep a blood pressure log take blood pressure log to all doctor appointments call doctor for signs and symptoms of high blood pressure keep all doctor appointments take medications for blood pressure exactly as prescribed report new symptoms to your doctor eat more whole grains, fruits and vegetables, lean meats and healthy fats Follow low sodium diet- read labels Change positions every 2 hours- this is very important Please call doctor if wound to bottom looks worse (redness, increased drainage, odor, fever) Continue to work with home health Report new findings to your  primary care provider  Follow Up Plan: Telephone follow up appointment with care management team member scheduled for:   07/01/23 at 215 pm          The patient verbalized understanding of instructions, educational materials, and care plan provided today and DECLINED offer to receive copy of patient instructions, educational materials, and care plan.  Telephone follow up appointment with care management team member scheduled for:  07/01/23 at 215 pm

## 2023-05-07 DIAGNOSIS — I10 Essential (primary) hypertension: Secondary | ICD-10-CM | POA: Diagnosis not present

## 2023-05-07 DIAGNOSIS — I129 Hypertensive chronic kidney disease with stage 1 through stage 4 chronic kidney disease, or unspecified chronic kidney disease: Secondary | ICD-10-CM

## 2023-05-07 DIAGNOSIS — Z794 Long term (current) use of insulin: Secondary | ICD-10-CM

## 2023-05-07 DIAGNOSIS — G311 Senile degeneration of brain, not elsewhere classified: Secondary | ICD-10-CM | POA: Diagnosis not present

## 2023-05-07 DIAGNOSIS — D631 Anemia in chronic kidney disease: Secondary | ICD-10-CM | POA: Diagnosis not present

## 2023-05-07 DIAGNOSIS — N184 Chronic kidney disease, stage 4 (severe): Secondary | ICD-10-CM | POA: Diagnosis not present

## 2023-05-07 DIAGNOSIS — E039 Hypothyroidism, unspecified: Secondary | ICD-10-CM | POA: Diagnosis not present

## 2023-05-07 DIAGNOSIS — I509 Heart failure, unspecified: Secondary | ICD-10-CM | POA: Diagnosis not present

## 2023-05-07 DIAGNOSIS — E1122 Type 2 diabetes mellitus with diabetic chronic kidney disease: Secondary | ICD-10-CM

## 2023-05-08 DIAGNOSIS — I10 Essential (primary) hypertension: Secondary | ICD-10-CM | POA: Diagnosis not present

## 2023-05-08 DIAGNOSIS — Z8673 Personal history of transient ischemic attack (TIA), and cerebral infarction without residual deficits: Secondary | ICD-10-CM | POA: Diagnosis not present

## 2023-05-08 DIAGNOSIS — E46 Unspecified protein-calorie malnutrition: Secondary | ICD-10-CM | POA: Diagnosis not present

## 2023-05-08 DIAGNOSIS — I509 Heart failure, unspecified: Secondary | ICD-10-CM | POA: Diagnosis not present

## 2023-05-08 DIAGNOSIS — I519 Heart disease, unspecified: Secondary | ICD-10-CM | POA: Diagnosis not present

## 2023-05-08 DIAGNOSIS — G40409 Other generalized epilepsy and epileptic syndromes, not intractable, without status epilepticus: Secondary | ICD-10-CM | POA: Diagnosis not present

## 2023-05-08 DIAGNOSIS — N184 Chronic kidney disease, stage 4 (severe): Secondary | ICD-10-CM | POA: Diagnosis not present

## 2023-05-08 DIAGNOSIS — M1909 Primary osteoarthritis, other specified site: Secondary | ICD-10-CM | POA: Diagnosis not present

## 2023-05-08 DIAGNOSIS — I669 Occlusion and stenosis of unspecified cerebral artery: Secondary | ICD-10-CM | POA: Diagnosis not present

## 2023-05-08 DIAGNOSIS — D631 Anemia in chronic kidney disease: Secondary | ICD-10-CM | POA: Diagnosis not present

## 2023-05-08 DIAGNOSIS — E039 Hypothyroidism, unspecified: Secondary | ICD-10-CM | POA: Diagnosis not present

## 2023-05-08 DIAGNOSIS — G311 Senile degeneration of brain, not elsewhere classified: Secondary | ICD-10-CM | POA: Diagnosis not present

## 2023-05-11 DIAGNOSIS — N184 Chronic kidney disease, stage 4 (severe): Secondary | ICD-10-CM | POA: Diagnosis not present

## 2023-05-11 DIAGNOSIS — D631 Anemia in chronic kidney disease: Secondary | ICD-10-CM | POA: Diagnosis not present

## 2023-05-11 DIAGNOSIS — I10 Essential (primary) hypertension: Secondary | ICD-10-CM | POA: Diagnosis not present

## 2023-05-11 DIAGNOSIS — I509 Heart failure, unspecified: Secondary | ICD-10-CM | POA: Diagnosis not present

## 2023-05-11 DIAGNOSIS — G311 Senile degeneration of brain, not elsewhere classified: Secondary | ICD-10-CM | POA: Diagnosis not present

## 2023-05-11 DIAGNOSIS — E039 Hypothyroidism, unspecified: Secondary | ICD-10-CM | POA: Diagnosis not present

## 2023-05-13 DIAGNOSIS — I10 Essential (primary) hypertension: Secondary | ICD-10-CM | POA: Diagnosis not present

## 2023-05-13 DIAGNOSIS — E039 Hypothyroidism, unspecified: Secondary | ICD-10-CM | POA: Diagnosis not present

## 2023-05-13 DIAGNOSIS — G311 Senile degeneration of brain, not elsewhere classified: Secondary | ICD-10-CM | POA: Diagnosis not present

## 2023-05-13 DIAGNOSIS — I509 Heart failure, unspecified: Secondary | ICD-10-CM | POA: Diagnosis not present

## 2023-05-13 DIAGNOSIS — N184 Chronic kidney disease, stage 4 (severe): Secondary | ICD-10-CM | POA: Diagnosis not present

## 2023-05-13 DIAGNOSIS — D631 Anemia in chronic kidney disease: Secondary | ICD-10-CM | POA: Diagnosis not present

## 2023-05-18 DIAGNOSIS — G311 Senile degeneration of brain, not elsewhere classified: Secondary | ICD-10-CM | POA: Diagnosis not present

## 2023-05-18 DIAGNOSIS — I10 Essential (primary) hypertension: Secondary | ICD-10-CM | POA: Diagnosis not present

## 2023-05-18 DIAGNOSIS — E039 Hypothyroidism, unspecified: Secondary | ICD-10-CM | POA: Diagnosis not present

## 2023-05-18 DIAGNOSIS — N184 Chronic kidney disease, stage 4 (severe): Secondary | ICD-10-CM | POA: Diagnosis not present

## 2023-05-18 DIAGNOSIS — D631 Anemia in chronic kidney disease: Secondary | ICD-10-CM | POA: Diagnosis not present

## 2023-05-18 DIAGNOSIS — I509 Heart failure, unspecified: Secondary | ICD-10-CM | POA: Diagnosis not present

## 2023-05-20 DIAGNOSIS — G311 Senile degeneration of brain, not elsewhere classified: Secondary | ICD-10-CM | POA: Diagnosis not present

## 2023-05-20 DIAGNOSIS — N184 Chronic kidney disease, stage 4 (severe): Secondary | ICD-10-CM | POA: Diagnosis not present

## 2023-05-20 DIAGNOSIS — I509 Heart failure, unspecified: Secondary | ICD-10-CM | POA: Diagnosis not present

## 2023-05-20 DIAGNOSIS — D631 Anemia in chronic kidney disease: Secondary | ICD-10-CM | POA: Diagnosis not present

## 2023-05-20 DIAGNOSIS — E039 Hypothyroidism, unspecified: Secondary | ICD-10-CM | POA: Diagnosis not present

## 2023-05-20 DIAGNOSIS — I10 Essential (primary) hypertension: Secondary | ICD-10-CM | POA: Diagnosis not present

## 2023-05-24 DIAGNOSIS — D631 Anemia in chronic kidney disease: Secondary | ICD-10-CM | POA: Diagnosis not present

## 2023-05-24 DIAGNOSIS — I10 Essential (primary) hypertension: Secondary | ICD-10-CM | POA: Diagnosis not present

## 2023-05-24 DIAGNOSIS — G311 Senile degeneration of brain, not elsewhere classified: Secondary | ICD-10-CM | POA: Diagnosis not present

## 2023-05-24 DIAGNOSIS — I509 Heart failure, unspecified: Secondary | ICD-10-CM | POA: Diagnosis not present

## 2023-05-24 DIAGNOSIS — N184 Chronic kidney disease, stage 4 (severe): Secondary | ICD-10-CM | POA: Diagnosis not present

## 2023-05-24 DIAGNOSIS — E039 Hypothyroidism, unspecified: Secondary | ICD-10-CM | POA: Diagnosis not present

## 2023-05-26 ENCOUNTER — Ambulatory Visit: Payer: Medicare Other | Admitting: Family Medicine

## 2023-05-26 DIAGNOSIS — I509 Heart failure, unspecified: Secondary | ICD-10-CM | POA: Diagnosis not present

## 2023-05-26 DIAGNOSIS — G311 Senile degeneration of brain, not elsewhere classified: Secondary | ICD-10-CM | POA: Diagnosis not present

## 2023-05-26 DIAGNOSIS — N184 Chronic kidney disease, stage 4 (severe): Secondary | ICD-10-CM | POA: Diagnosis not present

## 2023-05-26 DIAGNOSIS — I10 Essential (primary) hypertension: Secondary | ICD-10-CM | POA: Diagnosis not present

## 2023-05-26 DIAGNOSIS — D631 Anemia in chronic kidney disease: Secondary | ICD-10-CM | POA: Diagnosis not present

## 2023-05-26 DIAGNOSIS — E039 Hypothyroidism, unspecified: Secondary | ICD-10-CM | POA: Diagnosis not present

## 2023-06-02 DIAGNOSIS — I10 Essential (primary) hypertension: Secondary | ICD-10-CM | POA: Diagnosis not present

## 2023-06-02 DIAGNOSIS — N184 Chronic kidney disease, stage 4 (severe): Secondary | ICD-10-CM | POA: Diagnosis not present

## 2023-06-02 DIAGNOSIS — I509 Heart failure, unspecified: Secondary | ICD-10-CM | POA: Diagnosis not present

## 2023-06-02 DIAGNOSIS — D631 Anemia in chronic kidney disease: Secondary | ICD-10-CM | POA: Diagnosis not present

## 2023-06-02 DIAGNOSIS — E039 Hypothyroidism, unspecified: Secondary | ICD-10-CM | POA: Diagnosis not present

## 2023-06-02 DIAGNOSIS — G311 Senile degeneration of brain, not elsewhere classified: Secondary | ICD-10-CM | POA: Diagnosis not present

## 2023-06-04 DIAGNOSIS — I10 Essential (primary) hypertension: Secondary | ICD-10-CM | POA: Diagnosis not present

## 2023-06-04 DIAGNOSIS — D631 Anemia in chronic kidney disease: Secondary | ICD-10-CM | POA: Diagnosis not present

## 2023-06-04 DIAGNOSIS — E039 Hypothyroidism, unspecified: Secondary | ICD-10-CM | POA: Diagnosis not present

## 2023-06-04 DIAGNOSIS — G311 Senile degeneration of brain, not elsewhere classified: Secondary | ICD-10-CM | POA: Diagnosis not present

## 2023-06-04 DIAGNOSIS — I509 Heart failure, unspecified: Secondary | ICD-10-CM | POA: Diagnosis not present

## 2023-06-04 DIAGNOSIS — N184 Chronic kidney disease, stage 4 (severe): Secondary | ICD-10-CM | POA: Diagnosis not present

## 2023-06-07 DIAGNOSIS — G311 Senile degeneration of brain, not elsewhere classified: Secondary | ICD-10-CM | POA: Diagnosis not present

## 2023-06-07 DIAGNOSIS — M1909 Primary osteoarthritis, other specified site: Secondary | ICD-10-CM | POA: Diagnosis not present

## 2023-06-07 DIAGNOSIS — I519 Heart disease, unspecified: Secondary | ICD-10-CM | POA: Diagnosis not present

## 2023-06-07 DIAGNOSIS — E46 Unspecified protein-calorie malnutrition: Secondary | ICD-10-CM | POA: Diagnosis not present

## 2023-06-07 DIAGNOSIS — N184 Chronic kidney disease, stage 4 (severe): Secondary | ICD-10-CM | POA: Diagnosis not present

## 2023-06-07 DIAGNOSIS — G40409 Other generalized epilepsy and epileptic syndromes, not intractable, without status epilepticus: Secondary | ICD-10-CM | POA: Diagnosis not present

## 2023-06-07 DIAGNOSIS — D631 Anemia in chronic kidney disease: Secondary | ICD-10-CM | POA: Diagnosis not present

## 2023-06-07 DIAGNOSIS — E039 Hypothyroidism, unspecified: Secondary | ICD-10-CM | POA: Diagnosis not present

## 2023-06-07 DIAGNOSIS — I10 Essential (primary) hypertension: Secondary | ICD-10-CM | POA: Diagnosis not present

## 2023-06-07 DIAGNOSIS — I509 Heart failure, unspecified: Secondary | ICD-10-CM | POA: Diagnosis not present

## 2023-06-07 DIAGNOSIS — I669 Occlusion and stenosis of unspecified cerebral artery: Secondary | ICD-10-CM | POA: Diagnosis not present

## 2023-06-07 DIAGNOSIS — Z8673 Personal history of transient ischemic attack (TIA), and cerebral infarction without residual deficits: Secondary | ICD-10-CM | POA: Diagnosis not present

## 2023-06-09 DIAGNOSIS — D631 Anemia in chronic kidney disease: Secondary | ICD-10-CM | POA: Diagnosis not present

## 2023-06-09 DIAGNOSIS — N184 Chronic kidney disease, stage 4 (severe): Secondary | ICD-10-CM | POA: Diagnosis not present

## 2023-06-09 DIAGNOSIS — I509 Heart failure, unspecified: Secondary | ICD-10-CM | POA: Diagnosis not present

## 2023-06-09 DIAGNOSIS — E039 Hypothyroidism, unspecified: Secondary | ICD-10-CM | POA: Diagnosis not present

## 2023-06-09 DIAGNOSIS — I10 Essential (primary) hypertension: Secondary | ICD-10-CM | POA: Diagnosis not present

## 2023-06-09 DIAGNOSIS — G311 Senile degeneration of brain, not elsewhere classified: Secondary | ICD-10-CM | POA: Diagnosis not present

## 2023-06-11 DIAGNOSIS — D631 Anemia in chronic kidney disease: Secondary | ICD-10-CM | POA: Diagnosis not present

## 2023-06-11 DIAGNOSIS — E039 Hypothyroidism, unspecified: Secondary | ICD-10-CM | POA: Diagnosis not present

## 2023-06-11 DIAGNOSIS — G311 Senile degeneration of brain, not elsewhere classified: Secondary | ICD-10-CM | POA: Diagnosis not present

## 2023-06-11 DIAGNOSIS — N184 Chronic kidney disease, stage 4 (severe): Secondary | ICD-10-CM | POA: Diagnosis not present

## 2023-06-11 DIAGNOSIS — I10 Essential (primary) hypertension: Secondary | ICD-10-CM | POA: Diagnosis not present

## 2023-06-11 DIAGNOSIS — I509 Heart failure, unspecified: Secondary | ICD-10-CM | POA: Diagnosis not present

## 2023-06-16 DIAGNOSIS — G311 Senile degeneration of brain, not elsewhere classified: Secondary | ICD-10-CM | POA: Diagnosis not present

## 2023-06-16 DIAGNOSIS — D631 Anemia in chronic kidney disease: Secondary | ICD-10-CM | POA: Diagnosis not present

## 2023-06-16 DIAGNOSIS — I509 Heart failure, unspecified: Secondary | ICD-10-CM | POA: Diagnosis not present

## 2023-06-16 DIAGNOSIS — N184 Chronic kidney disease, stage 4 (severe): Secondary | ICD-10-CM | POA: Diagnosis not present

## 2023-06-16 DIAGNOSIS — I10 Essential (primary) hypertension: Secondary | ICD-10-CM | POA: Diagnosis not present

## 2023-06-16 DIAGNOSIS — E039 Hypothyroidism, unspecified: Secondary | ICD-10-CM | POA: Diagnosis not present

## 2023-06-17 DIAGNOSIS — I5032 Chronic diastolic (congestive) heart failure: Secondary | ICD-10-CM | POA: Diagnosis not present

## 2023-06-17 DIAGNOSIS — N184 Chronic kidney disease, stage 4 (severe): Secondary | ICD-10-CM | POA: Diagnosis not present

## 2023-06-17 DIAGNOSIS — I129 Hypertensive chronic kidney disease with stage 1 through stage 4 chronic kidney disease, or unspecified chronic kidney disease: Secondary | ICD-10-CM | POA: Diagnosis not present

## 2023-06-17 DIAGNOSIS — D631 Anemia in chronic kidney disease: Secondary | ICD-10-CM | POA: Diagnosis not present

## 2023-06-18 DIAGNOSIS — I10 Essential (primary) hypertension: Secondary | ICD-10-CM | POA: Diagnosis not present

## 2023-06-18 DIAGNOSIS — I509 Heart failure, unspecified: Secondary | ICD-10-CM | POA: Diagnosis not present

## 2023-06-18 DIAGNOSIS — N184 Chronic kidney disease, stage 4 (severe): Secondary | ICD-10-CM | POA: Diagnosis not present

## 2023-06-18 DIAGNOSIS — D631 Anemia in chronic kidney disease: Secondary | ICD-10-CM | POA: Diagnosis not present

## 2023-06-18 DIAGNOSIS — G311 Senile degeneration of brain, not elsewhere classified: Secondary | ICD-10-CM | POA: Diagnosis not present

## 2023-06-18 DIAGNOSIS — E039 Hypothyroidism, unspecified: Secondary | ICD-10-CM | POA: Diagnosis not present

## 2023-06-22 ENCOUNTER — Encounter: Payer: Self-pay | Admitting: Cardiology

## 2023-06-22 ENCOUNTER — Ambulatory Visit: Payer: Medicare Other | Attending: Cardiology | Admitting: Cardiology

## 2023-06-22 VITALS — Ht 68.0 in

## 2023-06-22 DIAGNOSIS — E782 Mixed hyperlipidemia: Secondary | ICD-10-CM

## 2023-06-22 DIAGNOSIS — R9431 Abnormal electrocardiogram [ECG] [EKG]: Secondary | ICD-10-CM | POA: Diagnosis not present

## 2023-06-22 DIAGNOSIS — I453 Trifascicular block: Secondary | ICD-10-CM | POA: Diagnosis not present

## 2023-06-22 DIAGNOSIS — I10 Essential (primary) hypertension: Secondary | ICD-10-CM | POA: Diagnosis not present

## 2023-06-22 NOTE — Patient Instructions (Signed)
Medication Instructions:  Continue all current medications.   Labwork: none  Testing/Procedures: none  Follow-Up: 6 months   Any Other Special Instructions Will Be Listed Below (If Applicable).   If you need a refill on your cardiac medications before your next appointment, please call your pharmacy.  

## 2023-06-22 NOTE — Progress Notes (Signed)
Virtual Visit via Telephone Note   Because of Chase [name]'s co-morbid illnesses, he is at least at moderate risk for complications without adequate follow up.  This format is felt to be most appropriate for this patient at this time.  The patient did not have access to video technology/had technical difficulties with video requiring transitioning to audio format only (telephone).  All issues noted in this document were discussed and addressed.  No physical exam could be performed with this format.  Please refer to the patient's chart for his consent to telehealth for Chase [name].    Date:  06/22/2023   ID:  Chase [name], DOB 09-20-1931, MRN [id number] The patient was identified using 2 identifiers.  Patient Location: Home Provider Location: Office/Clinic   PCP:  Kerri Perches, MD   [facility] Providers Cardiologist:  Dina Rich, MD {     Evaluation Performed:  Follow-Up Visit  Chief Complaint:  Follow up  History of Present Illness:    Chase [name] is a 89 y.o. male seen    1.Abnormal EKG/Conduction disease - chronic trifascicular block   - no recent symptoms   2.HTN - he is compliant with meds     3. DM2 - per pcp   4. CVA - he is on plavix, statin   5. CKD - followed by Dr Wolfgang Phoenix    6. Carotid stenosis - mild to moderate by 11/2020 [place] - followed by vascular   7. Venous stasis ulcers - followed by vascular   8. HL - he is on pravastatin 20mg  daily 2022 TC 169 TG 32 HDL 81 LDL 80  10. Dementia - considering hospice Past Medical History:  Diagnosis Date   Bradycardia 03/15/2012   Carotid artery occlusion    Choledocholithiasis 02/25/2018   CKD (chronic kidney disease) stage 4, GFR 15-29 ml/min (HCC)    Complete lesion of cervical spinal cord (HCC) 03/14/3012   Stable since 2006   CVA (cerebrovascular accident) (HCC) 09/07/12   right sided weakness   Depressive disorder, not elsewhere classified    Diabetes  mellitus approx 1994   Diabetic neuropathy (HCC)    History of kidney stones    Hypertensive heart disease    Hypothyroidism approx 2000   Lacunar stroke, acute (HCC) 03/14/2012   Obesity    Osteoarthrosis, unspecified whether generalized or localized, lower leg    Other and unspecified hyperlipidemia    Peripheral vascular disease, unspecified (HCC)    Seizures (HCC)    unknown etiology; on meds, last seizure was 2015   Spinal stenosis, unspecified region other than cervical    Spondylosis of unspecified site without mention of myelopathy    Past Surgical History:  Procedure Laterality Date   BILIARY STENT PLACEMENT N/A 05/27/2018   Procedure: BILIARY STENT PLACEMENT;  Surgeon: Malissa Hippo, MD;  [facility];  Service: Endoscopy;  Laterality: N/A;   COLONOSCOPY N/A 08/10/2013   Procedure: COLONOSCOPY;  Surgeon: Malissa Hippo, MD;  [facility];  Service: Endoscopy;  Laterality: N/A;  240   ERCP N/A 03/02/2018   Procedure: ENDOSCOPIC RETROGRADE CHOLANGIOPANCREATOGRAPHY (ERCP) With sphincterotomy and stent placement;  Surgeon: Malissa Hippo, MD;  [facility];  Service: Gastroenterology;  Laterality: N/A;   ERCP N/A 05/27/2018   Procedure: ENDOSCOPIC RETROGRADE CHOLANGIOPANCREATOGRAPHY (ERCP);  Surgeon: Malissa Hippo, MD;  [facility];  Service: Endoscopy;  Laterality: N/A;   ERCP N/A 09/16/2018   Procedure:  ENDOSCOPIC RETROGRADE CHOLANGIOPANCREATOGRAPHY (ERCP);  Surgeon: Malissa Hippo, MD;  [facility];  Service: Endoscopy;  Laterality: N/A;   GASTROINTESTINAL STENT REMOVAL N/A 05/27/2018   Procedure: Biliary STENT REMOVAL;  Surgeon: Malissa Hippo, MD;  [facility];  Service: Endoscopy;  Laterality: N/A;   GASTROINTESTINAL STENT REMOVAL N/A 09/16/2018   Procedure: GASTROINTESTINAL STENT REMOVAL;  Surgeon: Malissa Hippo, MD;  [facility];  Service: Endoscopy;  Laterality: N/A;   kidney stones  left x2  1975   KIDNEY SURGERY     Ruptured left kidney 30 yrs ago  from a kidney stone   LITHOTRIPSY N/A 05/27/2018   Procedure: MECHANICAL LITHOTRIPSY;  Surgeon: Malissa Hippo, MD;  [facility];  Service: Endoscopy;  Laterality: N/A;   REMOVAL OF STONES N/A 09/16/2018   Procedure: REMOVAL OF MULTIPLE STONES WITH BASKET AND BALLOON;  Surgeon: Malissa Hippo, MD;  [facility];  Service: Endoscopy;  Laterality: N/A;   SPHINCTEROTOMY N/A 05/27/2018   Procedure: SPHINCTEROTOMY extended;  Surgeon: Malissa Hippo, MD;  [facility];  Service: Endoscopy;  Laterality: N/A;   SPYGLASS CHOLANGIOSCOPY N/A 05/27/2018   Procedure: SPYGLASS CHOLANGIOSCOPY;  Surgeon: Malissa Hippo, MD;  [facility];  Service: Endoscopy;  Laterality: N/A;     No outpatient medications have been marked as taking for the 06/22/23 encounter (Appointment) with [name], MD.     Allergies:   Bayer advanced aspirin [aspirin] and Penicillins   Social History   Tobacco Use   Smoking status: Never    Passive exposure: Never   Smokeless tobacco: Never   Tobacco comments:    Verified by Daughter, Berna Spare.  Vaping Use   Vaping status: Never Used  Substance Use Topics   Alcohol use: No    Alcohol/week: 0.0 standard drinks of alcohol   Drug use: No     Family Hx: The patient's family history includes Diabetes in his brother, brother, daughter, daughter, and mother; Hypertension in his brother and brother; Prostate cancer in his father; Stroke in his brother.  ROS:   Please see the history of present illness.     All other systems reviewed and are negative.   Prior CV studies:   The following studies were reviewed today:  11/2020 carotid US Summary:  Right Carotid: Velocities in the right ICA are consistent with a 1-39%  stenosis.   Left Carotid: Velocities in the left ICA are consistent with a 40-59%  stenosis.   Labs/Other Tests and Data  Reviewed:    EKG:  No ECG reviewed.  Recent Labs: No results found for requested labs within last 365 days.   Recent Lipid Panel Lab Results  Component Value Date/Time   CHOL 169 03/27/2021 09:58 AM   TRIG 32 03/27/2021 09:58 AM   HDL 81 03/27/2021 09:58 AM   CHOLHDL 2.1 03/27/2021 09:58 AM   CHOLHDL 2.1 10/01/2020 11:36 AM   LDLCALC 80 03/27/2021 09:58 AM   LDLCALC 75 10/01/2020 11:36 AM    Wt Readings from Last 3 Encounters:  05/06/22 166 lb (75.3 kg)  10/02/21 166 lb 6.4 oz (75.5 kg)  09/25/21 166 lb 6.4 oz (75.5 kg)     Risk Assessment/Calculations:          Objective:    Vital Signs:   Today's Vitals   06/22/23 1526  Height: 5\' 8"  (1.727 m)   Body mass index is 25.24 kg/m. Normal affect.  Normal speech pattern and tone. Comfortable, no apparent distress. No audible signs of SOB or wheezing.   ASSESSMENT & PLAN:    1.Abnormal EKG - trifascicular block, no evidence of bradycardia. He has not had any symptoms - continue to monitor   2. Hyperlipidemia - request pcp labs, continue current meds   3. HTN '- contniue current meds              Time:   Today, I have spent 14 minutes with the patient with telehealth technology discussing the above problems.     Medication Adjustments/Labs and Tests Ordered: Current medicines are reviewed at length with the patient today.  Concerns regarding medicines are outlined above.   Tests Ordered: No orders of the defined types were placed in this encounter.   Medication Changes: No orders of the defined types were placed in this encounter.   Follow Up:  F/u 6 months  Signed, Dina Rich, MD  06/22/2023 12:27 PM    [facility]

## 2023-06-23 DIAGNOSIS — D631 Anemia in chronic kidney disease: Secondary | ICD-10-CM | POA: Diagnosis not present

## 2023-06-23 DIAGNOSIS — N184 Chronic kidney disease, stage 4 (severe): Secondary | ICD-10-CM | POA: Diagnosis not present

## 2023-06-23 DIAGNOSIS — G311 Senile degeneration of brain, not elsewhere classified: Secondary | ICD-10-CM | POA: Diagnosis not present

## 2023-06-23 DIAGNOSIS — I509 Heart failure, unspecified: Secondary | ICD-10-CM | POA: Diagnosis not present

## 2023-06-23 DIAGNOSIS — E039 Hypothyroidism, unspecified: Secondary | ICD-10-CM | POA: Diagnosis not present

## 2023-06-23 DIAGNOSIS — I10 Essential (primary) hypertension: Secondary | ICD-10-CM | POA: Diagnosis not present

## 2023-06-24 DIAGNOSIS — I10 Essential (primary) hypertension: Secondary | ICD-10-CM | POA: Diagnosis not present

## 2023-06-24 DIAGNOSIS — E039 Hypothyroidism, unspecified: Secondary | ICD-10-CM | POA: Diagnosis not present

## 2023-06-24 DIAGNOSIS — I509 Heart failure, unspecified: Secondary | ICD-10-CM | POA: Diagnosis not present

## 2023-06-24 DIAGNOSIS — G311 Senile degeneration of brain, not elsewhere classified: Secondary | ICD-10-CM | POA: Diagnosis not present

## 2023-06-24 DIAGNOSIS — N184 Chronic kidney disease, stage 4 (severe): Secondary | ICD-10-CM | POA: Diagnosis not present

## 2023-06-24 DIAGNOSIS — D631 Anemia in chronic kidney disease: Secondary | ICD-10-CM | POA: Diagnosis not present

## 2023-06-25 DIAGNOSIS — I10 Essential (primary) hypertension: Secondary | ICD-10-CM | POA: Diagnosis not present

## 2023-06-25 DIAGNOSIS — I509 Heart failure, unspecified: Secondary | ICD-10-CM | POA: Diagnosis not present

## 2023-06-25 DIAGNOSIS — E039 Hypothyroidism, unspecified: Secondary | ICD-10-CM | POA: Diagnosis not present

## 2023-06-25 DIAGNOSIS — D631 Anemia in chronic kidney disease: Secondary | ICD-10-CM | POA: Diagnosis not present

## 2023-06-25 DIAGNOSIS — N184 Chronic kidney disease, stage 4 (severe): Secondary | ICD-10-CM | POA: Diagnosis not present

## 2023-06-25 DIAGNOSIS — G311 Senile degeneration of brain, not elsewhere classified: Secondary | ICD-10-CM | POA: Diagnosis not present

## 2023-06-29 DIAGNOSIS — I509 Heart failure, unspecified: Secondary | ICD-10-CM | POA: Diagnosis not present

## 2023-06-29 DIAGNOSIS — N184 Chronic kidney disease, stage 4 (severe): Secondary | ICD-10-CM | POA: Diagnosis not present

## 2023-06-29 DIAGNOSIS — E039 Hypothyroidism, unspecified: Secondary | ICD-10-CM | POA: Diagnosis not present

## 2023-06-29 DIAGNOSIS — I10 Essential (primary) hypertension: Secondary | ICD-10-CM | POA: Diagnosis not present

## 2023-06-29 DIAGNOSIS — G311 Senile degeneration of brain, not elsewhere classified: Secondary | ICD-10-CM | POA: Diagnosis not present

## 2023-06-29 DIAGNOSIS — D631 Anemia in chronic kidney disease: Secondary | ICD-10-CM | POA: Diagnosis not present

## 2023-06-30 DIAGNOSIS — G311 Senile degeneration of brain, not elsewhere classified: Secondary | ICD-10-CM | POA: Diagnosis not present

## 2023-06-30 DIAGNOSIS — I509 Heart failure, unspecified: Secondary | ICD-10-CM | POA: Diagnosis not present

## 2023-06-30 DIAGNOSIS — E039 Hypothyroidism, unspecified: Secondary | ICD-10-CM | POA: Diagnosis not present

## 2023-06-30 DIAGNOSIS — I10 Essential (primary) hypertension: Secondary | ICD-10-CM | POA: Diagnosis not present

## 2023-06-30 DIAGNOSIS — D631 Anemia in chronic kidney disease: Secondary | ICD-10-CM | POA: Diagnosis not present

## 2023-06-30 DIAGNOSIS — N184 Chronic kidney disease, stage 4 (severe): Secondary | ICD-10-CM | POA: Diagnosis not present

## 2023-07-01 ENCOUNTER — Ambulatory Visit (INDEPENDENT_AMBULATORY_CARE_PROVIDER_SITE_OTHER): Payer: Medicare Other | Admitting: *Deleted

## 2023-07-01 DIAGNOSIS — E1121 Type 2 diabetes mellitus with diabetic nephropathy: Secondary | ICD-10-CM

## 2023-07-01 DIAGNOSIS — I10 Essential (primary) hypertension: Secondary | ICD-10-CM

## 2023-07-01 DIAGNOSIS — N184 Chronic kidney disease, stage 4 (severe): Secondary | ICD-10-CM

## 2023-07-01 NOTE — Chronic Care Management (AMB) (Signed)
Chronic Care Management   CCM RN Visit Note  07/01/2023 Name: Chase [name] MRN: [id number] DOB: 1931/01/17  Subjective: Chase [name] is a 89 y.o. year old male who is a primary care patient of Kerri Perches, MD. The patient was referred to the Chronic Care Management team for assistance with care management needs subsequent to provider initiation of CCM services and plan of care.    Today's Visit:  Engaged with patient by telephone for follow up visit.        Goals Addressed             This Visit's Progress    CCM (CHRONIC KIDNEY DISEASE) EXPECTED OUTCOME: MONITOR, SELF-MANAGE AND REDUCE SYMPTOMS OF CHRONIC KIDNEY DISEASE       Current Barriers:  Knowledge Deficits related to CKD stage 4 Chronic Disease Management support and education needs related to CKD stage 4 Cognitive Deficits No Advanced Directives in place- family declines Patient's spouse reports patient's appetite has improved some, if pt does not eat well, spouse supplements with premium protein drink, trying to eat as healthy as possible, reports wounds are healed  Planned Interventions: Evaluation of current treatment plan related to chronic kidney disease self management and patient's adherence to plan as established by provider      Reviewed medications with patient and discussed importance of compliance    Discussed complications of poorly controlled blood pressure such as heart disease, stroke, circulatory complications, vision complications, kidney impairment, sexual dysfunction    Reviewed scheduled/upcoming provider appointments including    Advised patient to discuss change in health status/ appetite  with provider    Discussed the impact of chronic kidney disease on daily life and mental health and acknowledged and normalized feelings of disempowerment, fear, and frustration    Support coping and stress management by recognizing current strategies and assist in developing new strategies such as  mindfulness, journaling, relaxation techniques, problem-solving    Reinforced importance of eating small portions of protein foods Reinforced choosing water to drink and offering to pt throughout the day Reviewed importance of changing positions q 2 hours to prevent skin breakdown  Symptom Management: Take medications as prescribed   Attend all scheduled provider appointments Call pharmacy for medication refills 3-7 days in advance of running out of medications Call provider office for new concerns or questions  Choose water as main beverage Offer food and beverage to patient throughout the day Read labels for sodium content/ avoid salty snacks and fast food Eat small portions of protein foods Change patient's positions q 2 hours Keep patient's skin clean and dry please keep in touch with your primary care provider and report any changes in health status/ symptoms  Follow Up Plan: Telephone follow up appointment with care management team member scheduled for:    08/20/23 at 215 pm       CCM (DIABETES) EXPECTED OUTCOME:  MONITOR, SELF-MANAGE AND REDUCE SYMPTOMS OF DIABETES       Current Barriers:  Knowledge Deficits related to Diabetes Chronic Disease Management support and education needs related to Diabetes and diet Cognitive Deficits No Advanced Directives in place- family declines Per spouse, CBG fasting ranges are 98-112, uses Freestyle Libre to monitor, reports pt has all medications and taking as prescribed Spouse reports there is someone there every evening to help get pt in the bed, reports they now have assistance in the home M-F 4 hours per day and this has been very helpful Spouse reports all wounds are healed AIC  09/07/22 = 5.7  Planned Interventions: Reviewed medications with patient and discussed importance of medication adherence;        Counseled on importance of regular laboratory monitoring as prescribed;        Advised patient, providing education and rationale, to  check cbg CGM  and record        call provider for findings outside established parameters;       Review of patient status, including review of consultants reports, relevant laboratory and other test results, and medications completed;       Advised patient to discuss any issues with blood sugar, medications, overall health with provider;      Reinforced importance of good blood sugar control for wound healing Reinforced that wound or infection can elevate blood sugar Reviewed signs/ symptoms of infection Reinforced carbohydrate modified diet  Symptom Management: Take medications as prescribed   Attend all scheduled provider appointments Call pharmacy for medication refills 3-7 days in advance of running out of medications Call provider office for new concerns or questions  check blood sugar at prescribed times: Freestyle Libre  check feet daily for cuts, sores or redness take the blood sugar log to all doctor visits take the blood sugar meter to all doctor visits trim toenails straight across fill half of plate with vegetables limit fast food meals to no more than 1 per week prepare main meal at home 3 to 5 days each week read food labels for fat, fiber, carbohydrates and portion size set a realistic goal keep feet up while sitting wash and dry feet carefully every day Report any new symptoms/ change in health status to your doctor  Report any worsening of wounds to your doctor If you notice any changes in patient's skin (redness, drainage, open areas) report to doctor as soon as possible Change positions every 2 hours  Follow Up Plan: Telephone follow up appointment with care management team member scheduled for:    08/20/23 at 215 pm       CCM (HYPERTENSION) EXPECTED OUTCOME: MONITOR, SELF-MANAGE AND REDUCE SYMPTOMS OF HYPERTENSION       Current Barriers:  Knowledge Deficits related to Hypertension Chronic Disease Management support and education needs related to Hypertension,  diet Cognitive Deficits No Advanced Directives in place- family declines Spoke with patient's wife Chase [name], patient's adult daughters are very active in the care of pt, pt has aide M-F 4 hours per day, pt has WC, walker, cane, patient has dementia and is on aricept, is now hydroxyzine prn for agitation, family does not monitor blood pressure and states palliative nurse no longer sees pt (per family request, family states it is very difficult to get patient to change positions q 2 hours, there is now a caregiver who comes in at night to get pt into bed and this is working out well.    Planned Interventions: Evaluation of current treatment plan related to hypertension self management and patient's adherence to plan as established by provider;   Reviewed medications with patient and discussed importance of compliance;  Discussed plans with patient for ongoing care management follow up and provided patient with direct contact information for care management team; Advised patient, providing education and rationale, to monitor blood pressure daily and record, calling PCP for findings outside established parameters;  Advised patient to discuss worsening would status with provider; Discussed complications of poorly controlled blood pressure such as heart disease, stroke, circulatory complications, vision complications, kidney impairment, sexual dysfunction;  Reinforced importance of changing  positions q 2 hours Reinforced importance of good nutrition Reviewed signs/ symptoms of infection Reinforced low sodium diet Reviewed plan of care with spouse and she feels checking in q 2 months is very helpful and would like to continue with care management services  Symptom Management: Take medications as prescribed   Attend all scheduled provider appointments Call pharmacy for medication refills 3-7 days in advance of running out of medications Perform all self care activities independently  Call provider  office for new concerns or questions  check blood pressure weekly choose a place to take my blood pressure (home, clinic or office, retail store) write blood pressure results in a log or diary learn about high blood pressure keep a blood pressure log take blood pressure log to all doctor appointments call doctor for signs and symptoms of high blood pressure keep all doctor appointments take medications for blood pressure exactly as prescribed report new symptoms to your doctor eat more whole grains, fruits and vegetables, lean meats and healthy fats Follow low sodium diet- read labels Change positions every 2 hours- this is very important Please call doctor if wounds reappear or for signs of infection (redness, increased drainage, odor, fever) Report new findings to your primary care provider  Follow Up Plan: Telephone follow up appointment with care management team member scheduled for:   08/20/23 ata 215 pm          Plan:Telephone follow up appointment with care management team member scheduled for:  08/20/23 at 215 pm  Irving Shows [facility], BSN RN Case Manager [facility] [phone]

## 2023-07-01 NOTE — Patient Instructions (Signed)
Please call the care guide team at [phone] if you need to cancel or reschedule your appointment.   If you are experiencing a Mental Health or Behavioral Health Crisis or need someone to talk to, please call the Suicide and Crisis Lifeline: 988 call the [place] National Suicide Prevention Lifeline: (845)626-4580 or TTY: 5418040715 TTY 405-610-2981) to talk to a trained counselor call 1-800-273-TALK (toll free, 24 hour hotline) go to [facility] [address] [place] [phone]) call the [facility]: [phone] call 911   Following is a copy of the CCM Program Consent:  CCM service includes personalized support from designated clinical staff supervised by the physician, including individualized plan of care and coordination with other care providers 24/7 contact phone numbers for assistance for urgent and routine care needs. Service will only be billed when office clinical staff spend 20 minutes or more in a month to coordinate care. Only one practitioner may furnish and bill the service in a calendar month. The patient may stop CCM services at amy time (effective at the end of the month) by phone call to the office staff. The patient will be responsible for cost sharing (co-pay) or up to 20% of the service fee (after annual deductible is met)  Following is a copy of your full provider care plan:   Goals Addressed             This Visit's Progress    CCM (CHRONIC KIDNEY DISEASE) EXPECTED OUTCOME: MONITOR, SELF-MANAGE AND REDUCE SYMPTOMS OF CHRONIC KIDNEY DISEASE       Current Barriers:  Knowledge Deficits related to CKD stage 4 Chronic Disease Management support and education needs related to CKD stage 4 Cognitive Deficits No Advanced Directives in place- family declines Patient's spouse reports patient's appetite has improved some, if pt does not eat well, spouse supplements with premium protein drink, trying to eat  as healthy as possible, reports wounds are healed  Planned Interventions: Evaluation of current treatment plan related to chronic kidney disease self management and patient's adherence to plan as established by provider      Reviewed medications with patient and discussed importance of compliance    Discussed complications of poorly controlled blood pressure such as heart disease, stroke, circulatory complications, vision complications, kidney impairment, sexual dysfunction    Reviewed scheduled/upcoming provider appointments including    Advised patient to discuss change in health status/ appetite  with provider    Discussed the impact of chronic kidney disease on daily life and mental health and acknowledged and normalized feelings of disempowerment, fear, and frustration    Support coping and stress management by recognizing current strategies and assist in developing new strategies such as mindfulness, journaling, relaxation techniques, problem-solving    Reinforced importance of eating small portions of protein foods Reinforced choosing water to drink and offering to pt throughout the day Reviewed importance of changing positions q 2 hours to prevent skin breakdown  Symptom Management: Take medications as prescribed   Attend all scheduled provider appointments Call pharmacy for medication refills 3-7 days in advance of running out of medications Call provider office for new concerns or questions  Choose water as main beverage Offer food and beverage to patient throughout the day Read labels for sodium content/ avoid salty snacks and fast food Eat small portions of protein foods Change patient's positions q 2 hours Keep patient's skin clean and dry please keep in touch with your primary care provider and report any changes in  health status/ symptoms  Follow Up Plan: Telephone follow up appointment with care management team member scheduled for:    08/20/23 at 215 pm       CCM  (DIABETES) EXPECTED OUTCOME:  MONITOR, SELF-MANAGE AND REDUCE SYMPTOMS OF DIABETES       Current Barriers:  Knowledge Deficits related to Diabetes Chronic Disease Management support and education needs related to Diabetes and diet Cognitive Deficits No Advanced Directives in place- family declines Per spouse, CBG fasting ranges are 98-112, uses Freestyle Libre to monitor, reports pt has all medications and taking as prescribed Spouse reports there is someone there every evening to help get pt in the bed, reports they now have assistance in the home M-F 4 hours per day and this has been very helpful Spouse reports all wounds are healed A[facility] 09/07/22 = 5.7  Planned Interventions: Reviewed medications with patient and discussed importance of medication adherence;        Counseled on importance of regular laboratory monitoring as prescribed;        Advised patient, providing education and rationale, to check cbg CGM  and record        call provider for findings outside established parameters;       Review of patient status, including review of consultants reports, relevant laboratory and other test results, and medications completed;       Advised patient to discuss any issues with blood sugar, medications, overall health with provider;      Reinforced importance of good blood sugar control for wound healing Reinforced that wound or infection can elevate blood sugar Reviewed signs/ symptoms of infection Reinforced carbohydrate modified diet  Symptom Management: Take medications as prescribed   Attend all scheduled provider appointments Call pharmacy for medication refills 3-7 days in advance of running out of medications Call provider office for new concerns or questions  check blood sugar at prescribed times: Freestyle Libre  check feet daily for cuts, sores or redness take the blood sugar log to all doctor visits take the blood sugar meter to all doctor visits trim toenails straight  across fill half of plate with vegetables limit fast food meals to no more than 1 per week prepare main meal at home 3 to 5 days each week read food labels for fat, fiber, carbohydrates and portion size set a realistic goal keep feet up while sitting wash and dry feet carefully every day Report any new symptoms/ change in health status to your doctor  Report any worsening of wounds to your doctor If you notice any changes in patient's skin (redness, drainage, open areas) report to doctor as soon as possible Change positions every 2 hours  Follow Up Plan: Telephone follow up appointment with care management team member scheduled for:    08/20/23 at 215 pm       CCM (HYPERTENSION) EXPECTED OUTCOME: MONITOR, SELF-MANAGE AND REDUCE SYMPTOMS OF HYPERTENSION       Current Barriers:  Knowledge Deficits related to Hypertension Chronic Disease Management support and education needs related to Hypertension, diet Cognitive Deficits No Advanced Directives in place- family declines Spoke with patient's wife Samnang Shugars, patient's adult daughters are very active in the care of pt, pt has aide M-F 4 hours per day, pt has WC, walker, cane, patient has dementia and is on aricept, is now hydroxyzine prn for agitation, family does not monitor blood pressure and states palliative nurse no longer sees pt (per family request, family states it is very difficult to get  patient to change positions q 2 hours, there is now a caregiver who comes in at night to get pt into bed and this is working out well.    Planned Interventions: Evaluation of current treatment plan related to hypertension self management and patient's adherence to plan as established by provider;   Reviewed medications with patient and discussed importance of compliance;  Discussed plans with patient for ongoing care management follow up and provided patient with direct contact information for care management team; Advised patient, providing  education and rationale, to monitor blood pressure daily and record, calling PCP for findings outside established parameters;  Advised patient to discuss worsening would status with provider; Discussed complications of poorly controlled blood pressure such as heart disease, stroke, circulatory complications, vision complications, kidney impairment, sexual dysfunction;  Reinforced importance of changing positions q 2 hours Reinforced importance of good nutrition Reviewed signs/ symptoms of infection Reinforced low sodium diet Reviewed plan of care with spouse and she feels checking in q 2 months is very helpful and would like to continue with care management services  Symptom Management: Take medications as prescribed   Attend all scheduled provider appointments Call pharmacy for medication refills 3-7 days in advance of running out of medications Perform all self care activities independently  Call provider office for new concerns or questions  check blood pressure weekly choose a place to take my blood pressure (home, clinic or office, retail store) write blood pressure results in a log or diary learn about high blood pressure keep a blood pressure log take blood pressure log to all doctor appointments call doctor for signs and symptoms of high blood pressure keep all doctor appointments take medications for blood pressure exactly as prescribed report new symptoms to your doctor eat more whole grains, fruits and vegetables, lean meats and healthy fats Follow low sodium diet- read labels Change positions every 2 hours- this is very important Please call doctor if wounds reappear or for signs of infection (redness, increased drainage, odor, fever) Report new findings to your primary care provider  Follow Up Plan: Telephone follow up appointment with care management team member scheduled for:   08/20/23 ata 215 pm          The patient verbalized understanding of instructions,  educational materials, and care plan provided today and DECLINED offer to receive copy of patient instructions, educational materials, and care plan.  Telephone follow up appointment with care management team member scheduled for:  08/20/23 at 215 pm

## 2023-07-02 DIAGNOSIS — I10 Essential (primary) hypertension: Secondary | ICD-10-CM | POA: Diagnosis not present

## 2023-07-02 DIAGNOSIS — E039 Hypothyroidism, unspecified: Secondary | ICD-10-CM | POA: Diagnosis not present

## 2023-07-02 DIAGNOSIS — N184 Chronic kidney disease, stage 4 (severe): Secondary | ICD-10-CM | POA: Diagnosis not present

## 2023-07-02 DIAGNOSIS — D631 Anemia in chronic kidney disease: Secondary | ICD-10-CM | POA: Diagnosis not present

## 2023-07-02 DIAGNOSIS — I509 Heart failure, unspecified: Secondary | ICD-10-CM | POA: Diagnosis not present

## 2023-07-02 DIAGNOSIS — G311 Senile degeneration of brain, not elsewhere classified: Secondary | ICD-10-CM | POA: Diagnosis not present

## 2023-07-07 DIAGNOSIS — D631 Anemia in chronic kidney disease: Secondary | ICD-10-CM | POA: Diagnosis not present

## 2023-07-07 DIAGNOSIS — I129 Hypertensive chronic kidney disease with stage 1 through stage 4 chronic kidney disease, or unspecified chronic kidney disease: Secondary | ICD-10-CM | POA: Diagnosis not present

## 2023-07-07 DIAGNOSIS — Z794 Long term (current) use of insulin: Secondary | ICD-10-CM | POA: Diagnosis not present

## 2023-07-07 DIAGNOSIS — E1122 Type 2 diabetes mellitus with diabetic chronic kidney disease: Secondary | ICD-10-CM

## 2023-07-07 DIAGNOSIS — G311 Senile degeneration of brain, not elsewhere classified: Secondary | ICD-10-CM | POA: Diagnosis not present

## 2023-07-07 DIAGNOSIS — N184 Chronic kidney disease, stage 4 (severe): Secondary | ICD-10-CM | POA: Diagnosis not present

## 2023-07-07 DIAGNOSIS — I509 Heart failure, unspecified: Secondary | ICD-10-CM | POA: Diagnosis not present

## 2023-07-07 DIAGNOSIS — E039 Hypothyroidism, unspecified: Secondary | ICD-10-CM | POA: Diagnosis not present

## 2023-07-07 DIAGNOSIS — I10 Essential (primary) hypertension: Secondary | ICD-10-CM | POA: Diagnosis not present

## 2023-07-08 DIAGNOSIS — G311 Senile degeneration of brain, not elsewhere classified: Secondary | ICD-10-CM | POA: Diagnosis not present

## 2023-07-08 DIAGNOSIS — I519 Heart disease, unspecified: Secondary | ICD-10-CM | POA: Diagnosis not present

## 2023-07-08 DIAGNOSIS — Z8673 Personal history of transient ischemic attack (TIA), and cerebral infarction without residual deficits: Secondary | ICD-10-CM | POA: Diagnosis not present

## 2023-07-08 DIAGNOSIS — I10 Essential (primary) hypertension: Secondary | ICD-10-CM | POA: Diagnosis not present

## 2023-07-08 DIAGNOSIS — N184 Chronic kidney disease, stage 4 (severe): Secondary | ICD-10-CM | POA: Diagnosis not present

## 2023-07-08 DIAGNOSIS — E039 Hypothyroidism, unspecified: Secondary | ICD-10-CM | POA: Diagnosis not present

## 2023-07-08 DIAGNOSIS — G40409 Other generalized epilepsy and epileptic syndromes, not intractable, without status epilepticus: Secondary | ICD-10-CM | POA: Diagnosis not present

## 2023-07-08 DIAGNOSIS — M1909 Primary osteoarthritis, other specified site: Secondary | ICD-10-CM | POA: Diagnosis not present

## 2023-07-08 DIAGNOSIS — I509 Heart failure, unspecified: Secondary | ICD-10-CM | POA: Diagnosis not present

## 2023-07-08 DIAGNOSIS — I669 Occlusion and stenosis of unspecified cerebral artery: Secondary | ICD-10-CM | POA: Diagnosis not present

## 2023-07-08 DIAGNOSIS — D631 Anemia in chronic kidney disease: Secondary | ICD-10-CM | POA: Diagnosis not present

## 2023-07-08 DIAGNOSIS — E46 Unspecified protein-calorie malnutrition: Secondary | ICD-10-CM | POA: Diagnosis not present

## 2023-07-09 DIAGNOSIS — E039 Hypothyroidism, unspecified: Secondary | ICD-10-CM | POA: Diagnosis not present

## 2023-07-09 DIAGNOSIS — I509 Heart failure, unspecified: Secondary | ICD-10-CM | POA: Diagnosis not present

## 2023-07-09 DIAGNOSIS — N184 Chronic kidney disease, stage 4 (severe): Secondary | ICD-10-CM | POA: Diagnosis not present

## 2023-07-09 DIAGNOSIS — G311 Senile degeneration of brain, not elsewhere classified: Secondary | ICD-10-CM | POA: Diagnosis not present

## 2023-07-09 DIAGNOSIS — I10 Essential (primary) hypertension: Secondary | ICD-10-CM | POA: Diagnosis not present

## 2023-07-09 DIAGNOSIS — D631 Anemia in chronic kidney disease: Secondary | ICD-10-CM | POA: Diagnosis not present

## 2023-07-10 DIAGNOSIS — D631 Anemia in chronic kidney disease: Secondary | ICD-10-CM | POA: Diagnosis not present

## 2023-07-10 DIAGNOSIS — E039 Hypothyroidism, unspecified: Secondary | ICD-10-CM | POA: Diagnosis not present

## 2023-07-10 DIAGNOSIS — I509 Heart failure, unspecified: Secondary | ICD-10-CM | POA: Diagnosis not present

## 2023-07-10 DIAGNOSIS — N184 Chronic kidney disease, stage 4 (severe): Secondary | ICD-10-CM | POA: Diagnosis not present

## 2023-07-10 DIAGNOSIS — G311 Senile degeneration of brain, not elsewhere classified: Secondary | ICD-10-CM | POA: Diagnosis not present

## 2023-07-10 DIAGNOSIS — I10 Essential (primary) hypertension: Secondary | ICD-10-CM | POA: Diagnosis not present

## 2023-07-11 DIAGNOSIS — D631 Anemia in chronic kidney disease: Secondary | ICD-10-CM | POA: Diagnosis not present

## 2023-07-11 DIAGNOSIS — I509 Heart failure, unspecified: Secondary | ICD-10-CM | POA: Diagnosis not present

## 2023-07-11 DIAGNOSIS — E039 Hypothyroidism, unspecified: Secondary | ICD-10-CM | POA: Diagnosis not present

## 2023-07-11 DIAGNOSIS — N184 Chronic kidney disease, stage 4 (severe): Secondary | ICD-10-CM | POA: Diagnosis not present

## 2023-07-11 DIAGNOSIS — G311 Senile degeneration of brain, not elsewhere classified: Secondary | ICD-10-CM | POA: Diagnosis not present

## 2023-07-11 DIAGNOSIS — I10 Essential (primary) hypertension: Secondary | ICD-10-CM | POA: Diagnosis not present

## 2023-07-12 DIAGNOSIS — G311 Senile degeneration of brain, not elsewhere classified: Secondary | ICD-10-CM | POA: Diagnosis not present

## 2023-07-12 DIAGNOSIS — I10 Essential (primary) hypertension: Secondary | ICD-10-CM | POA: Diagnosis not present

## 2023-07-12 DIAGNOSIS — E039 Hypothyroidism, unspecified: Secondary | ICD-10-CM | POA: Diagnosis not present

## 2023-07-12 DIAGNOSIS — I509 Heart failure, unspecified: Secondary | ICD-10-CM | POA: Diagnosis not present

## 2023-07-12 DIAGNOSIS — D631 Anemia in chronic kidney disease: Secondary | ICD-10-CM | POA: Diagnosis not present

## 2023-07-12 DIAGNOSIS — N184 Chronic kidney disease, stage 4 (severe): Secondary | ICD-10-CM | POA: Diagnosis not present

## 2023-07-14 DIAGNOSIS — D631 Anemia in chronic kidney disease: Secondary | ICD-10-CM | POA: Diagnosis not present

## 2023-07-14 DIAGNOSIS — E039 Hypothyroidism, unspecified: Secondary | ICD-10-CM | POA: Diagnosis not present

## 2023-07-14 DIAGNOSIS — N184 Chronic kidney disease, stage 4 (severe): Secondary | ICD-10-CM | POA: Diagnosis not present

## 2023-07-14 DIAGNOSIS — I10 Essential (primary) hypertension: Secondary | ICD-10-CM | POA: Diagnosis not present

## 2023-07-14 DIAGNOSIS — G311 Senile degeneration of brain, not elsewhere classified: Secondary | ICD-10-CM | POA: Diagnosis not present

## 2023-07-14 DIAGNOSIS — I509 Heart failure, unspecified: Secondary | ICD-10-CM | POA: Diagnosis not present

## 2023-07-20 DIAGNOSIS — D631 Anemia in chronic kidney disease: Secondary | ICD-10-CM | POA: Diagnosis not present

## 2023-07-20 DIAGNOSIS — I10 Essential (primary) hypertension: Secondary | ICD-10-CM | POA: Diagnosis not present

## 2023-07-20 DIAGNOSIS — I509 Heart failure, unspecified: Secondary | ICD-10-CM | POA: Diagnosis not present

## 2023-07-20 DIAGNOSIS — G311 Senile degeneration of brain, not elsewhere classified: Secondary | ICD-10-CM | POA: Diagnosis not present

## 2023-07-20 DIAGNOSIS — E039 Hypothyroidism, unspecified: Secondary | ICD-10-CM | POA: Diagnosis not present

## 2023-07-20 DIAGNOSIS — N184 Chronic kidney disease, stage 4 (severe): Secondary | ICD-10-CM | POA: Diagnosis not present

## 2023-07-23 DIAGNOSIS — I10 Essential (primary) hypertension: Secondary | ICD-10-CM | POA: Diagnosis not present

## 2023-07-23 DIAGNOSIS — E039 Hypothyroidism, unspecified: Secondary | ICD-10-CM | POA: Diagnosis not present

## 2023-07-23 DIAGNOSIS — G311 Senile degeneration of brain, not elsewhere classified: Secondary | ICD-10-CM | POA: Diagnosis not present

## 2023-07-23 DIAGNOSIS — I509 Heart failure, unspecified: Secondary | ICD-10-CM | POA: Diagnosis not present

## 2023-07-23 DIAGNOSIS — N184 Chronic kidney disease, stage 4 (severe): Secondary | ICD-10-CM | POA: Diagnosis not present

## 2023-07-23 DIAGNOSIS — D631 Anemia in chronic kidney disease: Secondary | ICD-10-CM | POA: Diagnosis not present

## 2023-07-28 DIAGNOSIS — D631 Anemia in chronic kidney disease: Secondary | ICD-10-CM | POA: Diagnosis not present

## 2023-07-28 DIAGNOSIS — I509 Heart failure, unspecified: Secondary | ICD-10-CM | POA: Diagnosis not present

## 2023-07-28 DIAGNOSIS — N184 Chronic kidney disease, stage 4 (severe): Secondary | ICD-10-CM | POA: Diagnosis not present

## 2023-07-28 DIAGNOSIS — G311 Senile degeneration of brain, not elsewhere classified: Secondary | ICD-10-CM | POA: Diagnosis not present

## 2023-07-28 DIAGNOSIS — I10 Essential (primary) hypertension: Secondary | ICD-10-CM | POA: Diagnosis not present

## 2023-07-28 DIAGNOSIS — E039 Hypothyroidism, unspecified: Secondary | ICD-10-CM | POA: Diagnosis not present

## 2023-07-30 DIAGNOSIS — I10 Essential (primary) hypertension: Secondary | ICD-10-CM | POA: Diagnosis not present

## 2023-07-30 DIAGNOSIS — I509 Heart failure, unspecified: Secondary | ICD-10-CM | POA: Diagnosis not present

## 2023-07-30 DIAGNOSIS — E039 Hypothyroidism, unspecified: Secondary | ICD-10-CM | POA: Diagnosis not present

## 2023-07-30 DIAGNOSIS — N184 Chronic kidney disease, stage 4 (severe): Secondary | ICD-10-CM | POA: Diagnosis not present

## 2023-07-30 DIAGNOSIS — G311 Senile degeneration of brain, not elsewhere classified: Secondary | ICD-10-CM | POA: Diagnosis not present

## 2023-07-30 DIAGNOSIS — D631 Anemia in chronic kidney disease: Secondary | ICD-10-CM | POA: Diagnosis not present

## 2023-07-31 DIAGNOSIS — E039 Hypothyroidism, unspecified: Secondary | ICD-10-CM | POA: Diagnosis not present

## 2023-07-31 DIAGNOSIS — D631 Anemia in chronic kidney disease: Secondary | ICD-10-CM | POA: Diagnosis not present

## 2023-07-31 DIAGNOSIS — N184 Chronic kidney disease, stage 4 (severe): Secondary | ICD-10-CM | POA: Diagnosis not present

## 2023-07-31 DIAGNOSIS — I10 Essential (primary) hypertension: Secondary | ICD-10-CM | POA: Diagnosis not present

## 2023-07-31 DIAGNOSIS — G311 Senile degeneration of brain, not elsewhere classified: Secondary | ICD-10-CM | POA: Diagnosis not present

## 2023-07-31 DIAGNOSIS — I509 Heart failure, unspecified: Secondary | ICD-10-CM | POA: Diagnosis not present

## 2023-08-04 DIAGNOSIS — I10 Essential (primary) hypertension: Secondary | ICD-10-CM | POA: Diagnosis not present

## 2023-08-04 DIAGNOSIS — G311 Senile degeneration of brain, not elsewhere classified: Secondary | ICD-10-CM | POA: Diagnosis not present

## 2023-08-04 DIAGNOSIS — D631 Anemia in chronic kidney disease: Secondary | ICD-10-CM | POA: Diagnosis not present

## 2023-08-04 DIAGNOSIS — I509 Heart failure, unspecified: Secondary | ICD-10-CM | POA: Diagnosis not present

## 2023-08-04 DIAGNOSIS — N184 Chronic kidney disease, stage 4 (severe): Secondary | ICD-10-CM | POA: Diagnosis not present

## 2023-08-04 DIAGNOSIS — E039 Hypothyroidism, unspecified: Secondary | ICD-10-CM | POA: Diagnosis not present

## 2023-08-05 DIAGNOSIS — I509 Heart failure, unspecified: Secondary | ICD-10-CM | POA: Diagnosis not present

## 2023-08-05 DIAGNOSIS — I10 Essential (primary) hypertension: Secondary | ICD-10-CM | POA: Diagnosis not present

## 2023-08-05 DIAGNOSIS — D631 Anemia in chronic kidney disease: Secondary | ICD-10-CM | POA: Diagnosis not present

## 2023-08-05 DIAGNOSIS — N184 Chronic kidney disease, stage 4 (severe): Secondary | ICD-10-CM | POA: Diagnosis not present

## 2023-08-05 DIAGNOSIS — G311 Senile degeneration of brain, not elsewhere classified: Secondary | ICD-10-CM | POA: Diagnosis not present

## 2023-08-05 DIAGNOSIS — E039 Hypothyroidism, unspecified: Secondary | ICD-10-CM | POA: Diagnosis not present

## 2023-08-06 DIAGNOSIS — D631 Anemia in chronic kidney disease: Secondary | ICD-10-CM | POA: Diagnosis not present

## 2023-08-06 DIAGNOSIS — G311 Senile degeneration of brain, not elsewhere classified: Secondary | ICD-10-CM | POA: Diagnosis not present

## 2023-08-06 DIAGNOSIS — I10 Essential (primary) hypertension: Secondary | ICD-10-CM | POA: Diagnosis not present

## 2023-08-06 DIAGNOSIS — E039 Hypothyroidism, unspecified: Secondary | ICD-10-CM | POA: Diagnosis not present

## 2023-08-06 DIAGNOSIS — N184 Chronic kidney disease, stage 4 (severe): Secondary | ICD-10-CM | POA: Diagnosis not present

## 2023-08-06 DIAGNOSIS — I509 Heart failure, unspecified: Secondary | ICD-10-CM | POA: Diagnosis not present

## 2023-08-08 DIAGNOSIS — N184 Chronic kidney disease, stage 4 (severe): Secondary | ICD-10-CM | POA: Diagnosis not present

## 2023-08-08 DIAGNOSIS — E46 Unspecified protein-calorie malnutrition: Secondary | ICD-10-CM | POA: Diagnosis not present

## 2023-08-08 DIAGNOSIS — E039 Hypothyroidism, unspecified: Secondary | ICD-10-CM | POA: Diagnosis not present

## 2023-08-08 DIAGNOSIS — D631 Anemia in chronic kidney disease: Secondary | ICD-10-CM | POA: Diagnosis not present

## 2023-08-08 DIAGNOSIS — I509 Heart failure, unspecified: Secondary | ICD-10-CM | POA: Diagnosis not present

## 2023-08-08 DIAGNOSIS — G311 Senile degeneration of brain, not elsewhere classified: Secondary | ICD-10-CM | POA: Diagnosis not present

## 2023-08-08 DIAGNOSIS — I10 Essential (primary) hypertension: Secondary | ICD-10-CM | POA: Diagnosis not present

## 2023-08-08 DIAGNOSIS — M1909 Primary osteoarthritis, other specified site: Secondary | ICD-10-CM | POA: Diagnosis not present

## 2023-08-08 DIAGNOSIS — I669 Occlusion and stenosis of unspecified cerebral artery: Secondary | ICD-10-CM | POA: Diagnosis not present

## 2023-08-08 DIAGNOSIS — G40409 Other generalized epilepsy and epileptic syndromes, not intractable, without status epilepticus: Secondary | ICD-10-CM | POA: Diagnosis not present

## 2023-08-08 DIAGNOSIS — I519 Heart disease, unspecified: Secondary | ICD-10-CM | POA: Diagnosis not present

## 2023-08-08 DIAGNOSIS — Z8673 Personal history of transient ischemic attack (TIA), and cerebral infarction without residual deficits: Secondary | ICD-10-CM | POA: Diagnosis not present

## 2023-08-11 DIAGNOSIS — G311 Senile degeneration of brain, not elsewhere classified: Secondary | ICD-10-CM | POA: Diagnosis not present

## 2023-08-11 DIAGNOSIS — N184 Chronic kidney disease, stage 4 (severe): Secondary | ICD-10-CM | POA: Diagnosis not present

## 2023-08-11 DIAGNOSIS — I10 Essential (primary) hypertension: Secondary | ICD-10-CM | POA: Diagnosis not present

## 2023-08-11 DIAGNOSIS — I509 Heart failure, unspecified: Secondary | ICD-10-CM | POA: Diagnosis not present

## 2023-08-11 DIAGNOSIS — E039 Hypothyroidism, unspecified: Secondary | ICD-10-CM | POA: Diagnosis not present

## 2023-08-11 DIAGNOSIS — D631 Anemia in chronic kidney disease: Secondary | ICD-10-CM | POA: Diagnosis not present

## 2023-08-13 ENCOUNTER — Encounter: Payer: Medicare Other | Admitting: Oncology

## 2023-08-13 ENCOUNTER — Ambulatory Visit: Payer: Medicare Other

## 2023-08-13 ENCOUNTER — Other Ambulatory Visit: Payer: Medicare Other

## 2023-08-13 DIAGNOSIS — E039 Hypothyroidism, unspecified: Secondary | ICD-10-CM | POA: Diagnosis not present

## 2023-08-13 DIAGNOSIS — N184 Chronic kidney disease, stage 4 (severe): Secondary | ICD-10-CM | POA: Diagnosis not present

## 2023-08-13 DIAGNOSIS — I509 Heart failure, unspecified: Secondary | ICD-10-CM | POA: Diagnosis not present

## 2023-08-13 DIAGNOSIS — G311 Senile degeneration of brain, not elsewhere classified: Secondary | ICD-10-CM | POA: Diagnosis not present

## 2023-08-13 DIAGNOSIS — D631 Anemia in chronic kidney disease: Secondary | ICD-10-CM | POA: Diagnosis not present

## 2023-08-13 DIAGNOSIS — I10 Essential (primary) hypertension: Secondary | ICD-10-CM | POA: Diagnosis not present

## 2023-08-16 ENCOUNTER — Encounter: Payer: Medicare Other | Admitting: Oncology

## 2023-08-16 ENCOUNTER — Ambulatory Visit: Payer: Medicare Other

## 2023-08-16 ENCOUNTER — Other Ambulatory Visit: Payer: Medicare Other

## 2023-08-17 DIAGNOSIS — B351 Tinea unguium: Secondary | ICD-10-CM | POA: Diagnosis not present

## 2023-08-17 DIAGNOSIS — E1142 Type 2 diabetes mellitus with diabetic polyneuropathy: Secondary | ICD-10-CM | POA: Diagnosis not present

## 2023-08-18 DIAGNOSIS — I10 Essential (primary) hypertension: Secondary | ICD-10-CM | POA: Diagnosis not present

## 2023-08-18 DIAGNOSIS — D631 Anemia in chronic kidney disease: Secondary | ICD-10-CM | POA: Diagnosis not present

## 2023-08-18 DIAGNOSIS — E039 Hypothyroidism, unspecified: Secondary | ICD-10-CM | POA: Diagnosis not present

## 2023-08-18 DIAGNOSIS — G311 Senile degeneration of brain, not elsewhere classified: Secondary | ICD-10-CM | POA: Diagnosis not present

## 2023-08-18 DIAGNOSIS — I509 Heart failure, unspecified: Secondary | ICD-10-CM | POA: Diagnosis not present

## 2023-08-18 DIAGNOSIS — N184 Chronic kidney disease, stage 4 (severe): Secondary | ICD-10-CM | POA: Diagnosis not present

## 2023-08-20 ENCOUNTER — Other Ambulatory Visit: Payer: Medicare Other | Admitting: *Deleted

## 2023-08-20 ENCOUNTER — Encounter: Payer: Self-pay | Admitting: *Deleted

## 2023-08-20 DIAGNOSIS — D631 Anemia in chronic kidney disease: Secondary | ICD-10-CM | POA: Diagnosis not present

## 2023-08-20 DIAGNOSIS — I10 Essential (primary) hypertension: Secondary | ICD-10-CM | POA: Diagnosis not present

## 2023-08-20 DIAGNOSIS — E039 Hypothyroidism, unspecified: Secondary | ICD-10-CM | POA: Diagnosis not present

## 2023-08-20 DIAGNOSIS — G311 Senile degeneration of brain, not elsewhere classified: Secondary | ICD-10-CM | POA: Diagnosis not present

## 2023-08-20 DIAGNOSIS — I509 Heart failure, unspecified: Secondary | ICD-10-CM | POA: Diagnosis not present

## 2023-08-20 DIAGNOSIS — N184 Chronic kidney disease, stage 4 (severe): Secondary | ICD-10-CM | POA: Diagnosis not present

## 2023-08-20 NOTE — Patient Outreach (Signed)
Care Management   Visit Note  08/20/2023 Name: Chase [name] MRN: [id number] DOB: [birth year]  Subjective: Chase [name] is a 89 y.o. year old male who is a primary care patient of Kerri Perches, MD. The Care Management team was consulted for assistance.      Engaged with patient spoke with patient by telephone for follow up   Goals Addressed             This Visit's Progress    COMPLETED: CCM (CHRONIC KIDNEY DISEASE) EXPECTED OUTCOME: MONITOR, SELF-MANAGE AND REDUCE SYMPTOMS OF CHRONIC KIDNEY DISEASE       Current Barriers:  Knowledge Deficits related to CKD stage 4 Chronic Disease Management support and education needs related to CKD stage 4 Cognitive Deficits No Advanced Directives in place- family declines Patient's spouse reports patient's appetite has improved some, if pt does not eat well, spouse supplements with premium protein drink, trying to eat as healthy as possible, reports wounds are healed Spouse Marsa Aris reports pt is now under the care of [facility], not sure if palliative care or hospice  Planned Interventions: Evaluation of current treatment plan related to chronic kidney disease self management and patient's adherence to plan as established by provider      Reviewed medications with patient and discussed importance of compliance    Discussed complications of poorly controlled blood pressure such as heart disease, stroke, circulatory complications, vision complications, kidney impairment, sexual dysfunction    Reviewed scheduled/upcoming provider appointments including    Advised patient to discuss change in health status/ appetite  with provider    Discussed the impact of chronic kidney disease on daily life and mental health and acknowledged and normalized feelings of disempowerment, fear, and frustration    Support coping and stress management by recognizing current strategies and assist in developing new strategies such as  mindfulness, journaling, relaxation techniques, problem-solving    Reviewed importance of eating small portions of protein foods Reviewed choosing water to drink and offering to pt throughout the day Reinforced importance of changing positions q 2 hours to prevent skin breakdown RN Care Manager called Chase [name] of [facility] and verified pt is on hospice services RN Care Manager reviewed plan of care with spouse including case closure  Symptom Management: Take medications as prescribed   Attend all scheduled provider appointments Call pharmacy for medication refills 3-7 days in advance of running out of medications Call provider office for new concerns or questions  Choose water as main beverage Offer food and beverage to patient throughout the day Read labels for sodium content/ avoid salty snacks and fast food Eat small portions of protein foods Change patient's positions q 2 hours Keep patient's skin clean and dry please keep in touch with your primary care provider and report any changes in health status/ symptoms Continue working with Chase [name]  Follow Up Plan: Case closure       COMPLETED: CCM (DIABETES) EXPECTED OUTCOME:  MONITOR, SELF-MANAGE AND REDUCE SYMPTOMS OF DIABETES       Current Barriers:  Knowledge Deficits related to Diabetes Chronic Disease Management support and education needs related to Diabetes and diet Cognitive Deficits No Advanced Directives in place- family declines Per spouse, CBG fasting ranges are 98-115 with today's reading 96, uses Freestyle Libre to monitor, reports pt has all medications and taking as prescribed Spouse reports there is someone there every evening to help get pt in the bed, reports they now have assistance in the home M-F  4 hours per day and this has been very helpful Spouse reports all wounds are healed, no new concerns reported [facility] 09/07/22 = 5.7  Planned Interventions: Reviewed medications with patient and discussed importance  of medication adherence;        Counseled on importance of regular laboratory monitoring as prescribed;        Advised patient, providing education and rationale, to check cbg CGM  and record        call provider for findings outside established parameters;       Review of patient status, including review of consultants reports, relevant laboratory and other test results, and medications completed;       Advised patient to discuss any issues with blood sugar, medications, overall health with provider;      Reinforced importance of good blood sugar control for wound healing Reinforced that wound or infection can elevate blood sugar Reviewed signs/ symptoms of infection Reviewed carbohydrate modified diet  Symptom Management: Take medications as prescribed   Attend all scheduled provider appointments Call pharmacy for medication refills 3-7 days in advance of running out of medications Call provider office for new concerns or questions  check blood sugar at prescribed times: Freestyle Libre  check feet daily for cuts, sores or redness take the blood sugar log to all doctor visits take the blood sugar meter to all doctor visits trim toenails straight across fill half of plate with vegetables limit fast food meals to no more than 1 per week prepare main meal at home 3 to 5 days each week read food labels for fat, fiber, carbohydrates and portion size set a realistic goal keep feet up while sitting wash and dry feet carefully every day Report any new symptoms/ change in health status to your doctor  Report any worsening of wounds to your doctor If you notice any changes in patient's skin (redness, drainage, open areas) report to doctor as soon as possible Change positions every 2 hours  Follow Up Plan: Case closure       COMPLETED: CCM (HYPERTENSION) EXPECTED OUTCOME: MONITOR, SELF-MANAGE AND REDUCE SYMPTOMS OF HYPERTENSION       Current Barriers:  Knowledge Deficits related to  Hypertension Chronic Disease Management support and education needs related to Hypertension, diet Cognitive Deficits No Advanced Directives in place- family declines Spoke with patient's wife Ahadu Skahan, patient's adult daughters are very active in the care of pt, pt has aide M-F 4 hours per day, pt has WC, walker, cane, patient has dementia and is on aricept, pt is under the care of hosp[facility]  Planned Interventions: Evaluation of current treatment plan related to hypertension self management and patient's adherence to plan as established by provider;   Reviewed medications with patient and discussed importance of compliance;  Discussed plans with patient for ongoing care management follow up and provided patient with direct contact information for care management team; Advised patient, providing education and rationale, to monitor blood pressure daily and record, calling PCP for findings outside established parameters;  Advised patient to discuss worsening would status with provider; Discussed complications of poorly controlled blood pressure such as heart disease, stroke, circulatory complications, vision complications, kidney impairment, sexual dysfunction;  Reinforced importance of changing positions q 2 hours Reinforced importance of good nutrition Reviewed signs/ symptoms of infection Reinforced low sodium diet Reviewed plan of care with spouse and she feels checking in q 2 months is very helpful and would like to continue with care management services  Symptom Management:  Take medications as prescribed   Attend all scheduled provider appointments Call pharmacy for medication refills 3-7 days in advance of running out of medications Perform all self care activities independently  Call provider office for new concerns or questions  check blood pressure weekly choose a place to take my blood pressure (home, clinic or office, retail store) write blood pressure results in a log or  diary learn about high blood pressure keep a blood pressure log take blood pressure log to all doctor appointments call doctor for signs and symptoms of high blood pressure keep all doctor appointments take medications for blood pressure exactly as prescribed report new symptoms to your doctor eat more whole grains, fruits and vegetables, lean meats and healthy fats Follow low sodium diet- read labels Change positions every 2 hours- this is very important Please call doctor if wounds reappear or for signs of infection (redness, increased drainage, odor, fever) Report new findings to your primary care provider  Follow Up Plan: Case closure           Plan: No further follow up required: case closed  Irving Shows [facility], BSN [facility]/ Ambulatory Care Management [phone]

## 2023-08-25 ENCOUNTER — Other Ambulatory Visit: Payer: Self-pay

## 2023-08-25 ENCOUNTER — Telehealth: Payer: Self-pay | Admitting: Family Medicine

## 2023-08-25 DIAGNOSIS — E039 Hypothyroidism, unspecified: Secondary | ICD-10-CM | POA: Diagnosis not present

## 2023-08-25 DIAGNOSIS — I10 Essential (primary) hypertension: Secondary | ICD-10-CM | POA: Diagnosis not present

## 2023-08-25 DIAGNOSIS — N184 Chronic kidney disease, stage 4 (severe): Secondary | ICD-10-CM | POA: Diagnosis not present

## 2023-08-25 DIAGNOSIS — D631 Anemia in chronic kidney disease: Secondary | ICD-10-CM | POA: Diagnosis not present

## 2023-08-25 DIAGNOSIS — G311 Senile degeneration of brain, not elsewhere classified: Secondary | ICD-10-CM | POA: Diagnosis not present

## 2023-08-25 DIAGNOSIS — I129 Hypertensive chronic kidney disease with stage 1 through stage 4 chronic kidney disease, or unspecified chronic kidney disease: Secondary | ICD-10-CM | POA: Diagnosis not present

## 2023-08-25 DIAGNOSIS — I5032 Chronic diastolic (congestive) heart failure: Secondary | ICD-10-CM | POA: Diagnosis not present

## 2023-08-25 DIAGNOSIS — I509 Heart failure, unspecified: Secondary | ICD-10-CM | POA: Diagnosis not present

## 2023-08-25 MED ORDER — PRAVASTATIN SODIUM 20 MG PO TABS: 20.0000 mg | ORAL_TABLET | Freq: Every day | ORAL | 3 refills | Status: DC

## 2023-08-25 NOTE — Telephone Encounter (Signed)
Refill sent.

## 2023-08-25 NOTE — Telephone Encounter (Signed)
Prescription Request  08/25/2023  LOV: 04/21/2023  What is the name of the medication or equipment? pravastatin (PRAVACHOL) 20 MG tablet [[id number]]    Have you contacted your pharmacy to request a refill? Yes   Which pharmacy would you like this sent to?   [facility] [address] [place] - [address] [place] [address] Phone: [phone] Fax: [phone]      Patient notified that their request is being sent to the clinical staff for review and that they should receive a response within 2 business days.   Please advise at Mobile [phone] (mobile)

## 2023-08-27 DIAGNOSIS — N184 Chronic kidney disease, stage 4 (severe): Secondary | ICD-10-CM | POA: Diagnosis not present

## 2023-08-27 DIAGNOSIS — E039 Hypothyroidism, unspecified: Secondary | ICD-10-CM | POA: Diagnosis not present

## 2023-08-27 DIAGNOSIS — I509 Heart failure, unspecified: Secondary | ICD-10-CM | POA: Diagnosis not present

## 2023-08-27 DIAGNOSIS — D631 Anemia in chronic kidney disease: Secondary | ICD-10-CM | POA: Diagnosis not present

## 2023-08-27 DIAGNOSIS — I10 Essential (primary) hypertension: Secondary | ICD-10-CM | POA: Diagnosis not present

## 2023-08-27 DIAGNOSIS — G311 Senile degeneration of brain, not elsewhere classified: Secondary | ICD-10-CM | POA: Diagnosis not present

## 2023-08-31 ENCOUNTER — Encounter: Payer: Self-pay | Admitting: Family Medicine

## 2023-08-31 ENCOUNTER — Ambulatory Visit (INDEPENDENT_AMBULATORY_CARE_PROVIDER_SITE_OTHER): Payer: Medicare Other | Admitting: Family Medicine

## 2023-08-31 VITALS — BP 136/72 | HR 63 | Ht 68.0 in

## 2023-08-31 DIAGNOSIS — G40209 Localization-related (focal) (partial) symptomatic epilepsy and epileptic syndromes with complex partial seizures, not intractable, without status epilepticus: Secondary | ICD-10-CM

## 2023-08-31 DIAGNOSIS — N184 Chronic kidney disease, stage 4 (severe): Secondary | ICD-10-CM | POA: Diagnosis not present

## 2023-08-31 DIAGNOSIS — E1121 Type 2 diabetes mellitus with diabetic nephropathy: Secondary | ICD-10-CM | POA: Diagnosis not present

## 2023-08-31 DIAGNOSIS — F22 Delusional disorders: Secondary | ICD-10-CM

## 2023-08-31 DIAGNOSIS — E038 Other specified hypothyroidism: Secondary | ICD-10-CM | POA: Diagnosis not present

## 2023-08-31 DIAGNOSIS — E782 Mixed hyperlipidemia: Secondary | ICD-10-CM | POA: Diagnosis not present

## 2023-08-31 DIAGNOSIS — F03B3 Unspecified dementia, moderate, with mood disturbance: Secondary | ICD-10-CM | POA: Diagnosis not present

## 2023-08-31 DIAGNOSIS — Z794 Long term (current) use of insulin: Secondary | ICD-10-CM | POA: Diagnosis not present

## 2023-08-31 DIAGNOSIS — I10 Essential (primary) hypertension: Secondary | ICD-10-CM

## 2023-08-31 DIAGNOSIS — Z23 Encounter for immunization: Secondary | ICD-10-CM

## 2023-08-31 NOTE — Patient Instructions (Signed)
F/U in January, call ifyou need me soomner  Flu vaccine Please eat regularly    Lipid, hepatic, TSH, HBA1C today  Thanks for choosing [facility], we consider it a privelige to serve you.

## 2023-09-01 ENCOUNTER — Encounter: Payer: Self-pay | Admitting: Family Medicine

## 2023-09-01 DIAGNOSIS — E039 Hypothyroidism, unspecified: Secondary | ICD-10-CM | POA: Diagnosis not present

## 2023-09-01 DIAGNOSIS — G311 Senile degeneration of brain, not elsewhere classified: Secondary | ICD-10-CM | POA: Diagnosis not present

## 2023-09-01 DIAGNOSIS — N184 Chronic kidney disease, stage 4 (severe): Secondary | ICD-10-CM | POA: Diagnosis not present

## 2023-09-01 DIAGNOSIS — I10 Essential (primary) hypertension: Secondary | ICD-10-CM | POA: Diagnosis not present

## 2023-09-01 DIAGNOSIS — I509 Heart failure, unspecified: Secondary | ICD-10-CM | POA: Diagnosis not present

## 2023-09-01 DIAGNOSIS — D631 Anemia in chronic kidney disease: Secondary | ICD-10-CM | POA: Diagnosis not present

## 2023-09-01 LAB — HEPATIC FUNCTION PANEL
ALT: 6 IU/L (ref 0–44)
AST: 13 IU/L (ref 0–40)
Albumin: 3.2 g/dL — ABNORMAL LOW (ref 3.6–4.6)
Alkaline Phosphatase: 118 IU/L (ref 44–121)
Bilirubin Total: <0.2 mg/dL (ref 0.0–1.2)
Bilirubin, Direct: <0.1 mg/dL (ref 0.00–0.40)
Total Protein: 6.4 g/dL (ref 6.0–8.5)

## 2023-09-01 LAB — HEMOGLOBIN A1C
Est. average glucose Bld gHb Est-mCnc: 160 mg/dL
Hgb A1c MFr Bld: 7.2 % — ABNORMAL HIGH (ref 4.8–5.6)

## 2023-09-01 LAB — LIPID PANEL
Chol/HDL Ratio: 2.6 ratio (ref 0.0–5.0)
Cholesterol, Total: 139 mg/dL (ref 100–199)
HDL: 53 mg/dL (ref >39)
LDL Chol Calc (NIH): 73 mg/dL (ref 0–99)
Triglycerides: 64 mg/dL (ref 0–149)
VLDL Cholesterol Cal: 13 mg/dL (ref 5–40)

## 2023-09-01 LAB — TSH: TSH: 4.43 u[IU]/mL (ref 0.450–4.500)

## 2023-09-01 NOTE — Assessment & Plan Note (Signed)
Persisting of no threat to self or family

## 2023-09-01 NOTE — Progress Notes (Signed)
TAVAN LACHMAN     MRN: [id number]      DOB: 1931/08/11  Chief Complaint  Patient presents with   Follow-up    Follow up concerns with appetite, agitation     HPI Mr. Matamoros is here for follow up and re-evaluation of chronic medical conditions, medication management and review of any available recent lab and radiology data.  Preventive health is updated, specifically   Immunization.   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 Denies sinus pressure, nasal congestion, ear pain or sore throat. Denies chest congestion, productive cough or wheezing. Denies chest pains, palpitations and leg swelling Chronic frequent excessive stool burden reported    Chronic joint stiffness , deformity and  and limitation in mobility. Reports sacral ulcer significantly better PE  BP 136/72   Pulse 63   Ht 5\' 8"  (1.727 m)   SpO2 98%   BMI 25.24 kg/m   Patient alert and in no cardiopulmonary distress.  HEENT: No facial asymmetry, EOMI,     Neck decreased ROM .  Chest: Clear to auscultation bilaterally.  CVS: S1, S2 no murmurs, no S3.Regular rate.  ABD: Soft non tender.   Ext: No edema  MS: Markedly decreased  ROM spine, shoulders, hips and knees.  Skin: Intact, no ulcerations or rash noted.  Psych: Good eye contact, normal affect. Memory intact not anxious or depressed appearing.  CNS: CN 2-12 intact, decreased hearing   Assessment & Plan  Type 2 diabetes mellitus with diabetic nephropathy (HCC) Controlled, no change in management, diet controlled, needs to wartch starch  Mr. Rients is reminded  of the importance of commitment to daily physical activity for 30 minutes or more, as able and the need to limit carbohydrate intake to 30 to 60 grams per meal to help with blood sugar control.   .   Mr. Reineck is reminded of the importance of daily foot exam, annual eye examination, and good blood sugar, blood pressure and cholesterol control.     Latest Ref Rng & Units 08/31/2023    1:06 PM 09/07/2022    1:47 PM 04/08/2022    2:15 PM 10/06/2021    1:21 PM 05/13/2021    1:30 PM  Diabetic Labs  HbA1c 4.8 - 5.6 % 7.2  5.7  5.8  5.7    Chol 100 - 199 mg/dL 324       HDL >40 mg/dL 53       Calc LDL 0 - 99 mg/dL 73       Triglycerides 0 - 149 mg/dL 64       Creatinine 1.02 - 1.24 mg/dL     7.25       3/66/4403   12:24 PM 08/31/2023   11:37 AM 04/21/2023    3:46 PM 12/25/2022    2:31 PM 09/22/2022    2:43 PM 09/07/2022    1:27 PM 08/06/2022    2:12 PM  BP/Weight  Systolic BP 136 151 135 138 152 136 130  Diastolic BP 72 94 77 63 77 68 70  Wt. (Lbs)  --    --  12/20/2018    1:40 PM 02/25/2017    2:40 PM  Foot/eye exam completion dates  Foot Form Completion Done Done         Seizure disorder, complex partial (HCC) Controlled, no change in medication   Mixed hyperlipidemia Hyperlipidemia:Low fat diet discussed and encouraged.   Lipid Panel  Lab Results  Component Value Date   CHOL 139 08/31/2023   HDL 53 08/31/2023   LDLCALC 73 08/31/2023   TRIG 64 08/31/2023   CHOLHDL 2.6 08/31/2023     Controlled, no change in medication   Hypothyroidism ,Controlled, no change in medication   Dementia (HCC) stable  Delusional disorder (HCC) Persisting of no threat to self or family  CKD (chronic kidney disease) stage 4, GFR 15-29 ml/min (HCC) Followed by Nephrology,

## 2023-09-01 NOTE — Assessment & Plan Note (Signed)
Controlled, no change in medication

## 2023-09-01 NOTE — Assessment & Plan Note (Signed)
stable °

## 2023-09-01 NOTE — Assessment & Plan Note (Signed)
Followed by Nephrology,

## 2023-09-01 NOTE — Assessment & Plan Note (Signed)
,  Controlled, no change in medication

## 2023-09-01 NOTE — Assessment & Plan Note (Signed)
Hyperlipidemia:Low fat diet discussed and encouraged.   Lipid Panel  Lab Results  Component Value Date   CHOL 139 08/31/2023   HDL 53 08/31/2023   LDLCALC 73 08/31/2023   TRIG 64 08/31/2023   CHOLHDL 2.6 08/31/2023     Controlled, no change in medication

## 2023-09-01 NOTE — Assessment & Plan Note (Signed)
Controlled, no change in management, diet controlled, needs to wartch starch  Mr. Kuhnle is reminded of the importance of commitment to daily physical activity for 30 minutes or more, as able and the need to limit carbohydrate intake to 30 to 60 grams per meal to help with blood sugar control.   .   Mr. Steitz is reminded of the importance of daily foot exam, annual eye examination, and good blood sugar, blood pressure and cholesterol control.     Latest Ref Rng & Units 08/31/2023    1:06 PM 09/07/2022    1:47 PM 04/08/2022    2:15 PM 10/06/2021    1:21 PM 05/13/2021    1:30 PM  Diabetic Labs  HbA1c 4.8 - 5.6 % 7.2  5.7  5.8  5.7    Chol 100 - 199 mg/dL 244       HDL >01 mg/dL 53       Calc LDL 0 - 99 mg/dL 73       Triglycerides 0 - 149 mg/dL 64       Creatinine 0.27 - 1.24 mg/dL     2.53       6/64/4034   12:24 PM 08/31/2023   11:37 AM 04/21/2023    3:46 PM 12/25/2022    2:31 PM 09/22/2022    2:43 PM 09/07/2022    1:27 PM 08/06/2022    2:12 PM  BP/Weight  Systolic BP 136 151 135 138 152 136 130  Diastolic BP 72 94 77 63 77 68 70  Wt. (Lbs)  --    --       12/20/2018    1:40 PM 02/25/2017    2:40 PM  Foot/eye exam completion dates  Foot Form Completion Done Done

## 2023-09-03 DIAGNOSIS — N184 Chronic kidney disease, stage 4 (severe): Secondary | ICD-10-CM | POA: Diagnosis not present

## 2023-09-03 DIAGNOSIS — D631 Anemia in chronic kidney disease: Secondary | ICD-10-CM | POA: Diagnosis not present

## 2023-09-03 DIAGNOSIS — I10 Essential (primary) hypertension: Secondary | ICD-10-CM | POA: Diagnosis not present

## 2023-09-03 DIAGNOSIS — G311 Senile degeneration of brain, not elsewhere classified: Secondary | ICD-10-CM | POA: Diagnosis not present

## 2023-09-03 DIAGNOSIS — E039 Hypothyroidism, unspecified: Secondary | ICD-10-CM | POA: Diagnosis not present

## 2023-09-03 DIAGNOSIS — I509 Heart failure, unspecified: Secondary | ICD-10-CM | POA: Diagnosis not present

## 2023-09-06 DIAGNOSIS — I10 Essential (primary) hypertension: Secondary | ICD-10-CM | POA: Diagnosis not present

## 2023-09-06 DIAGNOSIS — I509 Heart failure, unspecified: Secondary | ICD-10-CM | POA: Diagnosis not present

## 2023-09-06 DIAGNOSIS — D631 Anemia in chronic kidney disease: Secondary | ICD-10-CM | POA: Diagnosis not present

## 2023-09-06 DIAGNOSIS — G311 Senile degeneration of brain, not elsewhere classified: Secondary | ICD-10-CM | POA: Diagnosis not present

## 2023-09-06 DIAGNOSIS — N184 Chronic kidney disease, stage 4 (severe): Secondary | ICD-10-CM | POA: Diagnosis not present

## 2023-09-06 DIAGNOSIS — E039 Hypothyroidism, unspecified: Secondary | ICD-10-CM | POA: Diagnosis not present

## 2023-09-07 DIAGNOSIS — I669 Occlusion and stenosis of unspecified cerebral artery: Secondary | ICD-10-CM | POA: Diagnosis not present

## 2023-09-07 DIAGNOSIS — E46 Unspecified protein-calorie malnutrition: Secondary | ICD-10-CM | POA: Diagnosis not present

## 2023-09-07 DIAGNOSIS — M1909 Primary osteoarthritis, other specified site: Secondary | ICD-10-CM | POA: Diagnosis not present

## 2023-09-07 DIAGNOSIS — I519 Heart disease, unspecified: Secondary | ICD-10-CM | POA: Diagnosis not present

## 2023-09-07 DIAGNOSIS — G40409 Other generalized epilepsy and epileptic syndromes, not intractable, without status epilepticus: Secondary | ICD-10-CM | POA: Diagnosis not present

## 2023-09-07 DIAGNOSIS — G311 Senile degeneration of brain, not elsewhere classified: Secondary | ICD-10-CM | POA: Diagnosis not present

## 2023-09-07 DIAGNOSIS — Z8673 Personal history of transient ischemic attack (TIA), and cerebral infarction without residual deficits: Secondary | ICD-10-CM | POA: Diagnosis not present

## 2023-09-07 DIAGNOSIS — D631 Anemia in chronic kidney disease: Secondary | ICD-10-CM | POA: Diagnosis not present

## 2023-09-07 DIAGNOSIS — N184 Chronic kidney disease, stage 4 (severe): Secondary | ICD-10-CM | POA: Diagnosis not present

## 2023-09-07 DIAGNOSIS — I10 Essential (primary) hypertension: Secondary | ICD-10-CM | POA: Diagnosis not present

## 2023-09-07 DIAGNOSIS — I509 Heart failure, unspecified: Secondary | ICD-10-CM | POA: Diagnosis not present

## 2023-09-07 DIAGNOSIS — E039 Hypothyroidism, unspecified: Secondary | ICD-10-CM | POA: Diagnosis not present

## 2023-09-08 DIAGNOSIS — E039 Hypothyroidism, unspecified: Secondary | ICD-10-CM | POA: Diagnosis not present

## 2023-09-08 DIAGNOSIS — I509 Heart failure, unspecified: Secondary | ICD-10-CM | POA: Diagnosis not present

## 2023-09-08 DIAGNOSIS — I10 Essential (primary) hypertension: Secondary | ICD-10-CM | POA: Diagnosis not present

## 2023-09-08 DIAGNOSIS — D631 Anemia in chronic kidney disease: Secondary | ICD-10-CM | POA: Diagnosis not present

## 2023-09-08 DIAGNOSIS — G311 Senile degeneration of brain, not elsewhere classified: Secondary | ICD-10-CM | POA: Diagnosis not present

## 2023-09-08 DIAGNOSIS — N184 Chronic kidney disease, stage 4 (severe): Secondary | ICD-10-CM | POA: Diagnosis not present

## 2023-09-10 DIAGNOSIS — E039 Hypothyroidism, unspecified: Secondary | ICD-10-CM | POA: Diagnosis not present

## 2023-09-10 DIAGNOSIS — I509 Heart failure, unspecified: Secondary | ICD-10-CM | POA: Diagnosis not present

## 2023-09-10 DIAGNOSIS — N184 Chronic kidney disease, stage 4 (severe): Secondary | ICD-10-CM | POA: Diagnosis not present

## 2023-09-10 DIAGNOSIS — G311 Senile degeneration of brain, not elsewhere classified: Secondary | ICD-10-CM | POA: Diagnosis not present

## 2023-09-10 DIAGNOSIS — D631 Anemia in chronic kidney disease: Secondary | ICD-10-CM | POA: Diagnosis not present

## 2023-09-10 DIAGNOSIS — I10 Essential (primary) hypertension: Secondary | ICD-10-CM | POA: Diagnosis not present

## 2023-09-15 DIAGNOSIS — G311 Senile degeneration of brain, not elsewhere classified: Secondary | ICD-10-CM | POA: Diagnosis not present

## 2023-09-15 DIAGNOSIS — E039 Hypothyroidism, unspecified: Secondary | ICD-10-CM | POA: Diagnosis not present

## 2023-09-15 DIAGNOSIS — N184 Chronic kidney disease, stage 4 (severe): Secondary | ICD-10-CM | POA: Diagnosis not present

## 2023-09-15 DIAGNOSIS — I10 Essential (primary) hypertension: Secondary | ICD-10-CM | POA: Diagnosis not present

## 2023-09-15 DIAGNOSIS — I509 Heart failure, unspecified: Secondary | ICD-10-CM | POA: Diagnosis not present

## 2023-09-15 DIAGNOSIS — D631 Anemia in chronic kidney disease: Secondary | ICD-10-CM | POA: Diagnosis not present

## 2023-09-17 DIAGNOSIS — N184 Chronic kidney disease, stage 4 (severe): Secondary | ICD-10-CM | POA: Diagnosis not present

## 2023-09-17 DIAGNOSIS — I509 Heart failure, unspecified: Secondary | ICD-10-CM | POA: Diagnosis not present

## 2023-09-17 DIAGNOSIS — G311 Senile degeneration of brain, not elsewhere classified: Secondary | ICD-10-CM | POA: Diagnosis not present

## 2023-09-17 DIAGNOSIS — I10 Essential (primary) hypertension: Secondary | ICD-10-CM | POA: Diagnosis not present

## 2023-09-17 DIAGNOSIS — D631 Anemia in chronic kidney disease: Secondary | ICD-10-CM | POA: Diagnosis not present

## 2023-09-17 DIAGNOSIS — E039 Hypothyroidism, unspecified: Secondary | ICD-10-CM | POA: Diagnosis not present

## 2023-09-22 ENCOUNTER — Other Ambulatory Visit: Payer: Self-pay

## 2023-09-22 ENCOUNTER — Telehealth: Payer: Self-pay | Admitting: Family Medicine

## 2023-09-22 DIAGNOSIS — E039 Hypothyroidism, unspecified: Secondary | ICD-10-CM | POA: Diagnosis not present

## 2023-09-22 DIAGNOSIS — E038 Other specified hypothyroidism: Secondary | ICD-10-CM

## 2023-09-22 DIAGNOSIS — I509 Heart failure, unspecified: Secondary | ICD-10-CM | POA: Diagnosis not present

## 2023-09-22 DIAGNOSIS — G311 Senile degeneration of brain, not elsewhere classified: Secondary | ICD-10-CM | POA: Diagnosis not present

## 2023-09-22 DIAGNOSIS — I10 Essential (primary) hypertension: Secondary | ICD-10-CM | POA: Diagnosis not present

## 2023-09-22 DIAGNOSIS — N184 Chronic kidney disease, stage 4 (severe): Secondary | ICD-10-CM | POA: Diagnosis not present

## 2023-09-22 DIAGNOSIS — D631 Anemia in chronic kidney disease: Secondary | ICD-10-CM | POA: Diagnosis not present

## 2023-09-22 MED ORDER — LEVOTHYROXINE SODIUM 112 MCG PO TABS
112.0000 ug | ORAL_TABLET | Freq: Every day | ORAL | 3 refills | Status: DC
Start: 2023-09-22 — End: 2024-02-10

## 2023-09-22 MED ORDER — FREESTYLE LIBRE 2 SENSOR MISC: 3 refills | Status: DC

## 2023-09-22 NOTE — Telephone Encounter (Signed)
Refill sent.

## 2023-09-22 NOTE — Telephone Encounter (Signed)
Patient is needing a refill on levothyroxine (SYNTHROID) 112 MCG tablet [[id number]]  sent to [facility] in [place]. Please advise Thank you

## 2023-09-22 NOTE — Telephone Encounter (Signed)
Prescription Request  09/22/2023  LOV: 08/31/2023  What is the name of the medication or equipment? Continuous Glucose Sensor (FREESTYLE LIBRE 2 SENSOR) MISC   Have you contacted your pharmacy to request a refill? Yes   Which pharmacy would you like this sent to?   [facility] [address], [place] - [address] [place] [address] Phone: [phone] Fax: [phone]   Patient notified that their request is being sent to the clinical staff for review and that they should receive a response within 2 business days.   Please advise at [facility] [phone]

## 2023-09-24 DIAGNOSIS — E039 Hypothyroidism, unspecified: Secondary | ICD-10-CM | POA: Diagnosis not present

## 2023-09-24 DIAGNOSIS — I10 Essential (primary) hypertension: Secondary | ICD-10-CM | POA: Diagnosis not present

## 2023-09-24 DIAGNOSIS — D631 Anemia in chronic kidney disease: Secondary | ICD-10-CM | POA: Diagnosis not present

## 2023-09-24 DIAGNOSIS — N184 Chronic kidney disease, stage 4 (severe): Secondary | ICD-10-CM | POA: Diagnosis not present

## 2023-09-24 DIAGNOSIS — G311 Senile degeneration of brain, not elsewhere classified: Secondary | ICD-10-CM | POA: Diagnosis not present

## 2023-09-24 DIAGNOSIS — I509 Heart failure, unspecified: Secondary | ICD-10-CM | POA: Diagnosis not present

## 2023-09-27 DIAGNOSIS — G311 Senile degeneration of brain, not elsewhere classified: Secondary | ICD-10-CM | POA: Diagnosis not present

## 2023-09-27 DIAGNOSIS — E039 Hypothyroidism, unspecified: Secondary | ICD-10-CM | POA: Diagnosis not present

## 2023-09-27 DIAGNOSIS — I509 Heart failure, unspecified: Secondary | ICD-10-CM | POA: Diagnosis not present

## 2023-09-27 DIAGNOSIS — I10 Essential (primary) hypertension: Secondary | ICD-10-CM | POA: Diagnosis not present

## 2023-09-27 DIAGNOSIS — D631 Anemia in chronic kidney disease: Secondary | ICD-10-CM | POA: Diagnosis not present

## 2023-09-27 DIAGNOSIS — N184 Chronic kidney disease, stage 4 (severe): Secondary | ICD-10-CM | POA: Diagnosis not present

## 2023-09-29 DIAGNOSIS — D631 Anemia in chronic kidney disease: Secondary | ICD-10-CM | POA: Diagnosis not present

## 2023-09-29 DIAGNOSIS — N184 Chronic kidney disease, stage 4 (severe): Secondary | ICD-10-CM | POA: Diagnosis not present

## 2023-09-29 DIAGNOSIS — E039 Hypothyroidism, unspecified: Secondary | ICD-10-CM | POA: Diagnosis not present

## 2023-09-29 DIAGNOSIS — I509 Heart failure, unspecified: Secondary | ICD-10-CM | POA: Diagnosis not present

## 2023-09-29 DIAGNOSIS — I10 Essential (primary) hypertension: Secondary | ICD-10-CM | POA: Diagnosis not present

## 2023-09-29 DIAGNOSIS — G311 Senile degeneration of brain, not elsewhere classified: Secondary | ICD-10-CM | POA: Diagnosis not present

## 2023-10-05 DIAGNOSIS — D631 Anemia in chronic kidney disease: Secondary | ICD-10-CM | POA: Diagnosis not present

## 2023-10-05 DIAGNOSIS — E039 Hypothyroidism, unspecified: Secondary | ICD-10-CM | POA: Diagnosis not present

## 2023-10-05 DIAGNOSIS — I509 Heart failure, unspecified: Secondary | ICD-10-CM | POA: Diagnosis not present

## 2023-10-05 DIAGNOSIS — I10 Essential (primary) hypertension: Secondary | ICD-10-CM | POA: Diagnosis not present

## 2023-10-05 DIAGNOSIS — N184 Chronic kidney disease, stage 4 (severe): Secondary | ICD-10-CM | POA: Diagnosis not present

## 2023-10-05 DIAGNOSIS — G311 Senile degeneration of brain, not elsewhere classified: Secondary | ICD-10-CM | POA: Diagnosis not present

## 2023-10-06 DIAGNOSIS — D631 Anemia in chronic kidney disease: Secondary | ICD-10-CM | POA: Diagnosis not present

## 2023-10-06 DIAGNOSIS — G311 Senile degeneration of brain, not elsewhere classified: Secondary | ICD-10-CM | POA: Diagnosis not present

## 2023-10-06 DIAGNOSIS — I10 Essential (primary) hypertension: Secondary | ICD-10-CM | POA: Diagnosis not present

## 2023-10-06 DIAGNOSIS — N184 Chronic kidney disease, stage 4 (severe): Secondary | ICD-10-CM | POA: Diagnosis not present

## 2023-10-06 DIAGNOSIS — I509 Heart failure, unspecified: Secondary | ICD-10-CM | POA: Diagnosis not present

## 2023-10-06 DIAGNOSIS — E039 Hypothyroidism, unspecified: Secondary | ICD-10-CM | POA: Diagnosis not present

## 2023-10-08 DIAGNOSIS — I10 Essential (primary) hypertension: Secondary | ICD-10-CM | POA: Diagnosis not present

## 2023-10-08 DIAGNOSIS — N184 Chronic kidney disease, stage 4 (severe): Secondary | ICD-10-CM | POA: Diagnosis not present

## 2023-10-08 DIAGNOSIS — E039 Hypothyroidism, unspecified: Secondary | ICD-10-CM | POA: Diagnosis not present

## 2023-10-08 DIAGNOSIS — Z8673 Personal history of transient ischemic attack (TIA), and cerebral infarction without residual deficits: Secondary | ICD-10-CM | POA: Diagnosis not present

## 2023-10-08 DIAGNOSIS — E46 Unspecified protein-calorie malnutrition: Secondary | ICD-10-CM | POA: Diagnosis not present

## 2023-10-08 DIAGNOSIS — I519 Heart disease, unspecified: Secondary | ICD-10-CM | POA: Diagnosis not present

## 2023-10-08 DIAGNOSIS — G40409 Other generalized epilepsy and epileptic syndromes, not intractable, without status epilepticus: Secondary | ICD-10-CM | POA: Diagnosis not present

## 2023-10-08 DIAGNOSIS — G311 Senile degeneration of brain, not elsewhere classified: Secondary | ICD-10-CM | POA: Diagnosis not present

## 2023-10-08 DIAGNOSIS — I669 Occlusion and stenosis of unspecified cerebral artery: Secondary | ICD-10-CM | POA: Diagnosis not present

## 2023-10-08 DIAGNOSIS — M1909 Primary osteoarthritis, other specified site: Secondary | ICD-10-CM | POA: Diagnosis not present

## 2023-10-08 DIAGNOSIS — I509 Heart failure, unspecified: Secondary | ICD-10-CM | POA: Diagnosis not present

## 2023-10-08 DIAGNOSIS — D631 Anemia in chronic kidney disease: Secondary | ICD-10-CM | POA: Diagnosis not present

## 2023-10-12 DIAGNOSIS — I10 Essential (primary) hypertension: Secondary | ICD-10-CM | POA: Diagnosis not present

## 2023-10-12 DIAGNOSIS — D631 Anemia in chronic kidney disease: Secondary | ICD-10-CM | POA: Diagnosis not present

## 2023-10-12 DIAGNOSIS — I509 Heart failure, unspecified: Secondary | ICD-10-CM | POA: Diagnosis not present

## 2023-10-12 DIAGNOSIS — G311 Senile degeneration of brain, not elsewhere classified: Secondary | ICD-10-CM | POA: Diagnosis not present

## 2023-10-12 DIAGNOSIS — E039 Hypothyroidism, unspecified: Secondary | ICD-10-CM | POA: Diagnosis not present

## 2023-10-12 DIAGNOSIS — N184 Chronic kidney disease, stage 4 (severe): Secondary | ICD-10-CM | POA: Diagnosis not present

## 2023-10-14 DIAGNOSIS — G311 Senile degeneration of brain, not elsewhere classified: Secondary | ICD-10-CM | POA: Diagnosis not present

## 2023-10-14 DIAGNOSIS — N184 Chronic kidney disease, stage 4 (severe): Secondary | ICD-10-CM | POA: Diagnosis not present

## 2023-10-14 DIAGNOSIS — I509 Heart failure, unspecified: Secondary | ICD-10-CM | POA: Diagnosis not present

## 2023-10-14 DIAGNOSIS — E039 Hypothyroidism, unspecified: Secondary | ICD-10-CM | POA: Diagnosis not present

## 2023-10-14 DIAGNOSIS — D631 Anemia in chronic kidney disease: Secondary | ICD-10-CM | POA: Diagnosis not present

## 2023-10-14 DIAGNOSIS — I10 Essential (primary) hypertension: Secondary | ICD-10-CM | POA: Diagnosis not present

## 2023-10-19 DIAGNOSIS — E039 Hypothyroidism, unspecified: Secondary | ICD-10-CM | POA: Diagnosis not present

## 2023-10-19 DIAGNOSIS — G311 Senile degeneration of brain, not elsewhere classified: Secondary | ICD-10-CM | POA: Diagnosis not present

## 2023-10-19 DIAGNOSIS — N184 Chronic kidney disease, stage 4 (severe): Secondary | ICD-10-CM | POA: Diagnosis not present

## 2023-10-19 DIAGNOSIS — I509 Heart failure, unspecified: Secondary | ICD-10-CM | POA: Diagnosis not present

## 2023-10-19 DIAGNOSIS — I10 Essential (primary) hypertension: Secondary | ICD-10-CM | POA: Diagnosis not present

## 2023-10-19 DIAGNOSIS — D631 Anemia in chronic kidney disease: Secondary | ICD-10-CM | POA: Diagnosis not present

## 2023-10-21 DIAGNOSIS — N184 Chronic kidney disease, stage 4 (severe): Secondary | ICD-10-CM | POA: Diagnosis not present

## 2023-10-21 DIAGNOSIS — G311 Senile degeneration of brain, not elsewhere classified: Secondary | ICD-10-CM | POA: Diagnosis not present

## 2023-10-21 DIAGNOSIS — E039 Hypothyroidism, unspecified: Secondary | ICD-10-CM | POA: Diagnosis not present

## 2023-10-21 DIAGNOSIS — D631 Anemia in chronic kidney disease: Secondary | ICD-10-CM | POA: Diagnosis not present

## 2023-10-21 DIAGNOSIS — I509 Heart failure, unspecified: Secondary | ICD-10-CM | POA: Diagnosis not present

## 2023-10-21 DIAGNOSIS — I10 Essential (primary) hypertension: Secondary | ICD-10-CM | POA: Diagnosis not present

## 2023-10-26 DIAGNOSIS — D631 Anemia in chronic kidney disease: Secondary | ICD-10-CM | POA: Diagnosis not present

## 2023-10-26 DIAGNOSIS — G311 Senile degeneration of brain, not elsewhere classified: Secondary | ICD-10-CM | POA: Diagnosis not present

## 2023-10-26 DIAGNOSIS — N184 Chronic kidney disease, stage 4 (severe): Secondary | ICD-10-CM | POA: Diagnosis not present

## 2023-10-26 DIAGNOSIS — E039 Hypothyroidism, unspecified: Secondary | ICD-10-CM | POA: Diagnosis not present

## 2023-10-26 DIAGNOSIS — I10 Essential (primary) hypertension: Secondary | ICD-10-CM | POA: Diagnosis not present

## 2023-10-26 DIAGNOSIS — I509 Heart failure, unspecified: Secondary | ICD-10-CM | POA: Diagnosis not present

## 2023-10-28 DIAGNOSIS — I509 Heart failure, unspecified: Secondary | ICD-10-CM | POA: Diagnosis not present

## 2023-10-28 DIAGNOSIS — I10 Essential (primary) hypertension: Secondary | ICD-10-CM | POA: Diagnosis not present

## 2023-10-28 DIAGNOSIS — D631 Anemia in chronic kidney disease: Secondary | ICD-10-CM | POA: Diagnosis not present

## 2023-10-28 DIAGNOSIS — E039 Hypothyroidism, unspecified: Secondary | ICD-10-CM | POA: Diagnosis not present

## 2023-10-28 DIAGNOSIS — G311 Senile degeneration of brain, not elsewhere classified: Secondary | ICD-10-CM | POA: Diagnosis not present

## 2023-10-28 DIAGNOSIS — N184 Chronic kidney disease, stage 4 (severe): Secondary | ICD-10-CM | POA: Diagnosis not present

## 2023-11-02 DIAGNOSIS — E039 Hypothyroidism, unspecified: Secondary | ICD-10-CM | POA: Diagnosis not present

## 2023-11-02 DIAGNOSIS — D631 Anemia in chronic kidney disease: Secondary | ICD-10-CM | POA: Diagnosis not present

## 2023-11-02 DIAGNOSIS — I509 Heart failure, unspecified: Secondary | ICD-10-CM | POA: Diagnosis not present

## 2023-11-02 DIAGNOSIS — I10 Essential (primary) hypertension: Secondary | ICD-10-CM | POA: Diagnosis not present

## 2023-11-02 DIAGNOSIS — N184 Chronic kidney disease, stage 4 (severe): Secondary | ICD-10-CM | POA: Diagnosis not present

## 2023-11-02 DIAGNOSIS — G311 Senile degeneration of brain, not elsewhere classified: Secondary | ICD-10-CM | POA: Diagnosis not present

## 2023-11-05 DIAGNOSIS — I10 Essential (primary) hypertension: Secondary | ICD-10-CM | POA: Diagnosis not present

## 2023-11-05 DIAGNOSIS — N184 Chronic kidney disease, stage 4 (severe): Secondary | ICD-10-CM | POA: Diagnosis not present

## 2023-11-05 DIAGNOSIS — G311 Senile degeneration of brain, not elsewhere classified: Secondary | ICD-10-CM | POA: Diagnosis not present

## 2023-11-05 DIAGNOSIS — D631 Anemia in chronic kidney disease: Secondary | ICD-10-CM | POA: Diagnosis not present

## 2023-11-05 DIAGNOSIS — E039 Hypothyroidism, unspecified: Secondary | ICD-10-CM | POA: Diagnosis not present

## 2023-11-05 DIAGNOSIS — I509 Heart failure, unspecified: Secondary | ICD-10-CM | POA: Diagnosis not present

## 2023-11-07 DIAGNOSIS — I519 Heart disease, unspecified: Secondary | ICD-10-CM | POA: Diagnosis not present

## 2023-11-07 DIAGNOSIS — I669 Occlusion and stenosis of unspecified cerebral artery: Secondary | ICD-10-CM | POA: Diagnosis not present

## 2023-11-07 DIAGNOSIS — I10 Essential (primary) hypertension: Secondary | ICD-10-CM | POA: Diagnosis not present

## 2023-11-07 DIAGNOSIS — M1909 Primary osteoarthritis, other specified site: Secondary | ICD-10-CM | POA: Diagnosis not present

## 2023-11-07 DIAGNOSIS — I509 Heart failure, unspecified: Secondary | ICD-10-CM | POA: Diagnosis not present

## 2023-11-07 DIAGNOSIS — Z8673 Personal history of transient ischemic attack (TIA), and cerebral infarction without residual deficits: Secondary | ICD-10-CM | POA: Diagnosis not present

## 2023-11-07 DIAGNOSIS — D631 Anemia in chronic kidney disease: Secondary | ICD-10-CM | POA: Diagnosis not present

## 2023-11-07 DIAGNOSIS — E46 Unspecified protein-calorie malnutrition: Secondary | ICD-10-CM | POA: Diagnosis not present

## 2023-11-07 DIAGNOSIS — E039 Hypothyroidism, unspecified: Secondary | ICD-10-CM | POA: Diagnosis not present

## 2023-11-07 DIAGNOSIS — G311 Senile degeneration of brain, not elsewhere classified: Secondary | ICD-10-CM | POA: Diagnosis not present

## 2023-11-07 DIAGNOSIS — N184 Chronic kidney disease, stage 4 (severe): Secondary | ICD-10-CM | POA: Diagnosis not present

## 2023-11-07 DIAGNOSIS — G40409 Other generalized epilepsy and epileptic syndromes, not intractable, without status epilepticus: Secondary | ICD-10-CM | POA: Diagnosis not present

## 2023-11-09 DIAGNOSIS — D631 Anemia in chronic kidney disease: Secondary | ICD-10-CM | POA: Diagnosis not present

## 2023-11-09 DIAGNOSIS — I509 Heart failure, unspecified: Secondary | ICD-10-CM | POA: Diagnosis not present

## 2023-11-09 DIAGNOSIS — I10 Essential (primary) hypertension: Secondary | ICD-10-CM | POA: Diagnosis not present

## 2023-11-09 DIAGNOSIS — N184 Chronic kidney disease, stage 4 (severe): Secondary | ICD-10-CM | POA: Diagnosis not present

## 2023-11-09 DIAGNOSIS — E039 Hypothyroidism, unspecified: Secondary | ICD-10-CM | POA: Diagnosis not present

## 2023-11-09 DIAGNOSIS — G311 Senile degeneration of brain, not elsewhere classified: Secondary | ICD-10-CM | POA: Diagnosis not present

## 2023-11-11 DIAGNOSIS — I10 Essential (primary) hypertension: Secondary | ICD-10-CM | POA: Diagnosis not present

## 2023-11-11 DIAGNOSIS — D631 Anemia in chronic kidney disease: Secondary | ICD-10-CM | POA: Diagnosis not present

## 2023-11-11 DIAGNOSIS — N184 Chronic kidney disease, stage 4 (severe): Secondary | ICD-10-CM | POA: Diagnosis not present

## 2023-11-11 DIAGNOSIS — G311 Senile degeneration of brain, not elsewhere classified: Secondary | ICD-10-CM | POA: Diagnosis not present

## 2023-11-11 DIAGNOSIS — E039 Hypothyroidism, unspecified: Secondary | ICD-10-CM | POA: Diagnosis not present

## 2023-11-11 DIAGNOSIS — I509 Heart failure, unspecified: Secondary | ICD-10-CM | POA: Diagnosis not present

## 2023-11-12 ENCOUNTER — Other Ambulatory Visit: Payer: Self-pay | Admitting: Family Medicine

## 2023-11-16 DIAGNOSIS — E039 Hypothyroidism, unspecified: Secondary | ICD-10-CM | POA: Diagnosis not present

## 2023-11-16 DIAGNOSIS — D631 Anemia in chronic kidney disease: Secondary | ICD-10-CM | POA: Diagnosis not present

## 2023-11-16 DIAGNOSIS — N184 Chronic kidney disease, stage 4 (severe): Secondary | ICD-10-CM | POA: Diagnosis not present

## 2023-11-16 DIAGNOSIS — I509 Heart failure, unspecified: Secondary | ICD-10-CM | POA: Diagnosis not present

## 2023-11-16 DIAGNOSIS — G311 Senile degeneration of brain, not elsewhere classified: Secondary | ICD-10-CM | POA: Diagnosis not present

## 2023-11-16 DIAGNOSIS — I10 Essential (primary) hypertension: Secondary | ICD-10-CM | POA: Diagnosis not present

## 2023-11-18 ENCOUNTER — Telehealth: Payer: Self-pay

## 2023-11-18 DIAGNOSIS — G311 Senile degeneration of brain, not elsewhere classified: Secondary | ICD-10-CM | POA: Diagnosis not present

## 2023-11-18 DIAGNOSIS — I10 Essential (primary) hypertension: Secondary | ICD-10-CM | POA: Diagnosis not present

## 2023-11-18 DIAGNOSIS — E039 Hypothyroidism, unspecified: Secondary | ICD-10-CM | POA: Diagnosis not present

## 2023-11-18 DIAGNOSIS — N184 Chronic kidney disease, stage 4 (severe): Secondary | ICD-10-CM | POA: Diagnosis not present

## 2023-11-18 DIAGNOSIS — I509 Heart failure, unspecified: Secondary | ICD-10-CM | POA: Diagnosis not present

## 2023-11-18 DIAGNOSIS — D631 Anemia in chronic kidney disease: Secondary | ICD-10-CM | POA: Diagnosis not present

## 2023-11-18 NOTE — Telephone Encounter (Signed)
Copied from CRM [id number]. Topic: Clinical - Medication Question >> Nov 17, 2023  5:35 PM Tiffany H wrote: Reason for CRM: Patient's wife called to advise that due to patient being unable to secure another glucose monitor for the arm, she's having trouble getting patient to allow him to poke his finger for a manual draw. Patient's wife would like to know if there is anything Dr. Lodema Hong can do to appeal insurance not wanting to pay for a new arm monitor. Please assist.

## 2023-11-22 DIAGNOSIS — I10 Essential (primary) hypertension: Secondary | ICD-10-CM | POA: Diagnosis not present

## 2023-11-22 DIAGNOSIS — N184 Chronic kidney disease, stage 4 (severe): Secondary | ICD-10-CM | POA: Diagnosis not present

## 2023-11-22 DIAGNOSIS — I509 Heart failure, unspecified: Secondary | ICD-10-CM | POA: Diagnosis not present

## 2023-11-22 DIAGNOSIS — G311 Senile degeneration of brain, not elsewhere classified: Secondary | ICD-10-CM | POA: Diagnosis not present

## 2023-11-22 DIAGNOSIS — D631 Anemia in chronic kidney disease: Secondary | ICD-10-CM | POA: Diagnosis not present

## 2023-11-22 DIAGNOSIS — E039 Hypothyroidism, unspecified: Secondary | ICD-10-CM | POA: Diagnosis not present

## 2023-11-24 DIAGNOSIS — E039 Hypothyroidism, unspecified: Secondary | ICD-10-CM | POA: Diagnosis not present

## 2023-11-24 DIAGNOSIS — I509 Heart failure, unspecified: Secondary | ICD-10-CM | POA: Diagnosis not present

## 2023-11-24 DIAGNOSIS — G311 Senile degeneration of brain, not elsewhere classified: Secondary | ICD-10-CM | POA: Diagnosis not present

## 2023-11-24 DIAGNOSIS — I10 Essential (primary) hypertension: Secondary | ICD-10-CM | POA: Diagnosis not present

## 2023-11-24 DIAGNOSIS — D631 Anemia in chronic kidney disease: Secondary | ICD-10-CM | POA: Diagnosis not present

## 2023-11-24 DIAGNOSIS — N184 Chronic kidney disease, stage 4 (severe): Secondary | ICD-10-CM | POA: Diagnosis not present

## 2023-11-26 DIAGNOSIS — G311 Senile degeneration of brain, not elsewhere classified: Secondary | ICD-10-CM | POA: Diagnosis not present

## 2023-11-26 DIAGNOSIS — I10 Essential (primary) hypertension: Secondary | ICD-10-CM | POA: Diagnosis not present

## 2023-11-26 DIAGNOSIS — N184 Chronic kidney disease, stage 4 (severe): Secondary | ICD-10-CM | POA: Diagnosis not present

## 2023-11-26 DIAGNOSIS — D631 Anemia in chronic kidney disease: Secondary | ICD-10-CM | POA: Diagnosis not present

## 2023-11-26 DIAGNOSIS — I509 Heart failure, unspecified: Secondary | ICD-10-CM | POA: Diagnosis not present

## 2023-11-26 DIAGNOSIS — E039 Hypothyroidism, unspecified: Secondary | ICD-10-CM | POA: Diagnosis not present

## 2023-11-30 DIAGNOSIS — D631 Anemia in chronic kidney disease: Secondary | ICD-10-CM | POA: Diagnosis not present

## 2023-11-30 DIAGNOSIS — N184 Chronic kidney disease, stage 4 (severe): Secondary | ICD-10-CM | POA: Diagnosis not present

## 2023-11-30 DIAGNOSIS — E039 Hypothyroidism, unspecified: Secondary | ICD-10-CM | POA: Diagnosis not present

## 2023-11-30 DIAGNOSIS — G311 Senile degeneration of brain, not elsewhere classified: Secondary | ICD-10-CM | POA: Diagnosis not present

## 2023-11-30 DIAGNOSIS — I10 Essential (primary) hypertension: Secondary | ICD-10-CM | POA: Diagnosis not present

## 2023-11-30 DIAGNOSIS — I509 Heart failure, unspecified: Secondary | ICD-10-CM | POA: Diagnosis not present

## 2023-12-02 DIAGNOSIS — E039 Hypothyroidism, unspecified: Secondary | ICD-10-CM | POA: Diagnosis not present

## 2023-12-02 DIAGNOSIS — D631 Anemia in chronic kidney disease: Secondary | ICD-10-CM | POA: Diagnosis not present

## 2023-12-02 DIAGNOSIS — G311 Senile degeneration of brain, not elsewhere classified: Secondary | ICD-10-CM | POA: Diagnosis not present

## 2023-12-02 DIAGNOSIS — N184 Chronic kidney disease, stage 4 (severe): Secondary | ICD-10-CM | POA: Diagnosis not present

## 2023-12-02 DIAGNOSIS — I509 Heart failure, unspecified: Secondary | ICD-10-CM | POA: Diagnosis not present

## 2023-12-02 DIAGNOSIS — I10 Essential (primary) hypertension: Secondary | ICD-10-CM | POA: Diagnosis not present

## 2023-12-07 DIAGNOSIS — N184 Chronic kidney disease, stage 4 (severe): Secondary | ICD-10-CM | POA: Diagnosis not present

## 2023-12-07 DIAGNOSIS — I10 Essential (primary) hypertension: Secondary | ICD-10-CM | POA: Diagnosis not present

## 2023-12-07 DIAGNOSIS — E039 Hypothyroidism, unspecified: Secondary | ICD-10-CM | POA: Diagnosis not present

## 2023-12-07 DIAGNOSIS — G311 Senile degeneration of brain, not elsewhere classified: Secondary | ICD-10-CM | POA: Diagnosis not present

## 2023-12-07 DIAGNOSIS — D631 Anemia in chronic kidney disease: Secondary | ICD-10-CM | POA: Diagnosis not present

## 2023-12-07 DIAGNOSIS — I509 Heart failure, unspecified: Secondary | ICD-10-CM | POA: Diagnosis not present

## 2023-12-08 DIAGNOSIS — G311 Senile degeneration of brain, not elsewhere classified: Secondary | ICD-10-CM | POA: Diagnosis not present

## 2023-12-08 DIAGNOSIS — N184 Chronic kidney disease, stage 4 (severe): Secondary | ICD-10-CM | POA: Diagnosis not present

## 2023-12-08 DIAGNOSIS — I669 Occlusion and stenosis of unspecified cerebral artery: Secondary | ICD-10-CM | POA: Diagnosis not present

## 2023-12-08 DIAGNOSIS — D631 Anemia in chronic kidney disease: Secondary | ICD-10-CM | POA: Diagnosis not present

## 2023-12-08 DIAGNOSIS — E039 Hypothyroidism, unspecified: Secondary | ICD-10-CM | POA: Diagnosis not present

## 2023-12-08 DIAGNOSIS — I519 Heart disease, unspecified: Secondary | ICD-10-CM | POA: Diagnosis not present

## 2023-12-08 DIAGNOSIS — I10 Essential (primary) hypertension: Secondary | ICD-10-CM | POA: Diagnosis not present

## 2023-12-08 DIAGNOSIS — I509 Heart failure, unspecified: Secondary | ICD-10-CM | POA: Diagnosis not present

## 2023-12-08 DIAGNOSIS — E46 Unspecified protein-calorie malnutrition: Secondary | ICD-10-CM | POA: Diagnosis not present

## 2023-12-08 DIAGNOSIS — G40409 Other generalized epilepsy and epileptic syndromes, not intractable, without status epilepticus: Secondary | ICD-10-CM | POA: Diagnosis not present

## 2023-12-08 DIAGNOSIS — M1909 Primary osteoarthritis, other specified site: Secondary | ICD-10-CM | POA: Diagnosis not present

## 2023-12-08 DIAGNOSIS — Z8673 Personal history of transient ischemic attack (TIA), and cerebral infarction without residual deficits: Secondary | ICD-10-CM | POA: Diagnosis not present

## 2023-12-09 DIAGNOSIS — G311 Senile degeneration of brain, not elsewhere classified: Secondary | ICD-10-CM | POA: Diagnosis not present

## 2023-12-09 DIAGNOSIS — D631 Anemia in chronic kidney disease: Secondary | ICD-10-CM | POA: Diagnosis not present

## 2023-12-09 DIAGNOSIS — E039 Hypothyroidism, unspecified: Secondary | ICD-10-CM | POA: Diagnosis not present

## 2023-12-09 DIAGNOSIS — I509 Heart failure, unspecified: Secondary | ICD-10-CM | POA: Diagnosis not present

## 2023-12-09 DIAGNOSIS — I10 Essential (primary) hypertension: Secondary | ICD-10-CM | POA: Diagnosis not present

## 2023-12-09 DIAGNOSIS — N184 Chronic kidney disease, stage 4 (severe): Secondary | ICD-10-CM | POA: Diagnosis not present

## 2023-12-15 DIAGNOSIS — I10 Essential (primary) hypertension: Secondary | ICD-10-CM | POA: Diagnosis not present

## 2023-12-15 DIAGNOSIS — N184 Chronic kidney disease, stage 4 (severe): Secondary | ICD-10-CM | POA: Diagnosis not present

## 2023-12-15 DIAGNOSIS — G311 Senile degeneration of brain, not elsewhere classified: Secondary | ICD-10-CM | POA: Diagnosis not present

## 2023-12-15 DIAGNOSIS — I509 Heart failure, unspecified: Secondary | ICD-10-CM | POA: Diagnosis not present

## 2023-12-15 DIAGNOSIS — D631 Anemia in chronic kidney disease: Secondary | ICD-10-CM | POA: Diagnosis not present

## 2023-12-15 DIAGNOSIS — E039 Hypothyroidism, unspecified: Secondary | ICD-10-CM | POA: Diagnosis not present

## 2023-12-21 DIAGNOSIS — D631 Anemia in chronic kidney disease: Secondary | ICD-10-CM | POA: Diagnosis not present

## 2023-12-21 DIAGNOSIS — E039 Hypothyroidism, unspecified: Secondary | ICD-10-CM | POA: Diagnosis not present

## 2023-12-21 DIAGNOSIS — I509 Heart failure, unspecified: Secondary | ICD-10-CM | POA: Diagnosis not present

## 2023-12-21 DIAGNOSIS — I10 Essential (primary) hypertension: Secondary | ICD-10-CM | POA: Diagnosis not present

## 2023-12-21 DIAGNOSIS — G311 Senile degeneration of brain, not elsewhere classified: Secondary | ICD-10-CM | POA: Diagnosis not present

## 2023-12-21 DIAGNOSIS — N184 Chronic kidney disease, stage 4 (severe): Secondary | ICD-10-CM | POA: Diagnosis not present

## 2023-12-23 DIAGNOSIS — E039 Hypothyroidism, unspecified: Secondary | ICD-10-CM | POA: Diagnosis not present

## 2023-12-23 DIAGNOSIS — N184 Chronic kidney disease, stage 4 (severe): Secondary | ICD-10-CM | POA: Diagnosis not present

## 2023-12-23 DIAGNOSIS — I10 Essential (primary) hypertension: Secondary | ICD-10-CM | POA: Diagnosis not present

## 2023-12-23 DIAGNOSIS — G311 Senile degeneration of brain, not elsewhere classified: Secondary | ICD-10-CM | POA: Diagnosis not present

## 2023-12-23 DIAGNOSIS — D631 Anemia in chronic kidney disease: Secondary | ICD-10-CM | POA: Diagnosis not present

## 2023-12-23 DIAGNOSIS — I509 Heart failure, unspecified: Secondary | ICD-10-CM | POA: Diagnosis not present

## 2023-12-28 DIAGNOSIS — G311 Senile degeneration of brain, not elsewhere classified: Secondary | ICD-10-CM | POA: Diagnosis not present

## 2023-12-28 DIAGNOSIS — I10 Essential (primary) hypertension: Secondary | ICD-10-CM | POA: Diagnosis not present

## 2023-12-28 DIAGNOSIS — N184 Chronic kidney disease, stage 4 (severe): Secondary | ICD-10-CM | POA: Diagnosis not present

## 2023-12-28 DIAGNOSIS — D631 Anemia in chronic kidney disease: Secondary | ICD-10-CM | POA: Diagnosis not present

## 2023-12-28 DIAGNOSIS — I509 Heart failure, unspecified: Secondary | ICD-10-CM | POA: Diagnosis not present

## 2023-12-28 DIAGNOSIS — E039 Hypothyroidism, unspecified: Secondary | ICD-10-CM | POA: Diagnosis not present

## 2023-12-30 DIAGNOSIS — I10 Essential (primary) hypertension: Secondary | ICD-10-CM | POA: Diagnosis not present

## 2023-12-30 DIAGNOSIS — D631 Anemia in chronic kidney disease: Secondary | ICD-10-CM | POA: Diagnosis not present

## 2023-12-30 DIAGNOSIS — E039 Hypothyroidism, unspecified: Secondary | ICD-10-CM | POA: Diagnosis not present

## 2023-12-30 DIAGNOSIS — N184 Chronic kidney disease, stage 4 (severe): Secondary | ICD-10-CM | POA: Diagnosis not present

## 2023-12-30 DIAGNOSIS — G311 Senile degeneration of brain, not elsewhere classified: Secondary | ICD-10-CM | POA: Diagnosis not present

## 2023-12-30 DIAGNOSIS — I509 Heart failure, unspecified: Secondary | ICD-10-CM | POA: Diagnosis not present

## 2023-12-31 ENCOUNTER — Ambulatory Visit: Payer: Medicare Other | Admitting: Family Medicine

## 2023-12-31 ENCOUNTER — Encounter: Payer: Self-pay | Admitting: Family Medicine

## 2023-12-31 VITALS — BP 123/83 | HR 76 | Ht 68.0 in

## 2023-12-31 DIAGNOSIS — E782 Mixed hyperlipidemia: Secondary | ICD-10-CM | POA: Diagnosis not present

## 2023-12-31 DIAGNOSIS — Z794 Long term (current) use of insulin: Secondary | ICD-10-CM

## 2023-12-31 DIAGNOSIS — L97515 Non-pressure chronic ulcer of other part of right foot with muscle involvement without evidence of necrosis: Secondary | ICD-10-CM

## 2023-12-31 DIAGNOSIS — E038 Other specified hypothyroidism: Secondary | ICD-10-CM | POA: Diagnosis not present

## 2023-12-31 DIAGNOSIS — I10 Essential (primary) hypertension: Secondary | ICD-10-CM | POA: Diagnosis not present

## 2023-12-31 DIAGNOSIS — E559 Vitamin D deficiency, unspecified: Secondary | ICD-10-CM

## 2023-12-31 DIAGNOSIS — E1121 Type 2 diabetes mellitus with diabetic nephropathy: Secondary | ICD-10-CM

## 2023-12-31 DIAGNOSIS — F03B3 Unspecified dementia, moderate, with mood disturbance: Secondary | ICD-10-CM | POA: Diagnosis not present

## 2023-12-31 NOTE — Patient Instructions (Addendum)
F/U in 8 to 10 weeks, call if you need me sooner  Son needs to get work excuse for today for his job   Please schedule wellness at checkout  Labs today, HBA1C, TSH, cmp and EGFR, CBC and vit D  The ulcer on your right foot is approx 3 in wide, not very deep, we have applied dry gauze and will ask Wound nurse to check it twice weekly for the next 6 weeks

## 2024-01-01 LAB — BMP8+EGFR
BUN/Creatinine Ratio: 17 (ref 10–24)
BUN: 43 mg/dL — ABNORMAL HIGH (ref 10–36)
CO2: 19 mmol/L — ABNORMAL LOW (ref 20–29)
Calcium: 9.1 mg/dL (ref 8.6–10.2)
Chloride: 108 mmol/L — ABNORMAL HIGH (ref 96–106)
Creatinine, Ser: 2.57 mg/dL — ABNORMAL HIGH (ref 0.76–1.27)
Glucose: 228 mg/dL — ABNORMAL HIGH (ref 70–99)
Potassium: 5.5 mmol/L — ABNORMAL HIGH (ref 3.5–5.2)
Sodium: 141 mmol/L (ref 134–144)
eGFR: 23 mL/min/{1.73_m2} — ABNORMAL LOW (ref >59)

## 2024-01-01 LAB — CBC
Hematocrit: 33 % — ABNORMAL LOW (ref 37.5–51.0)
Hemoglobin: 10.2 g/dL — ABNORMAL LOW (ref 13.0–17.7)
MCH: 31.7 pg (ref 26.6–33.0)
MCHC: 30.9 g/dL — ABNORMAL LOW (ref 31.5–35.7)
MCV: 103 fL — ABNORMAL HIGH (ref 79–97)
Platelets: 322 10*3/uL (ref 150–450)
RBC: 3.22 x10E6/uL — ABNORMAL LOW (ref 4.14–5.80)
RDW: 12 % (ref 11.6–15.4)
WBC: 6.1 10*3/uL (ref 3.4–10.8)

## 2024-01-01 LAB — HEMOGLOBIN A1C
Est. average glucose Bld gHb Est-mCnc: 160 mg/dL
Hgb A1c MFr Bld: 7.2 % — ABNORMAL HIGH (ref 4.8–5.6)

## 2024-01-01 LAB — TSH: TSH: 5.28 u[IU]/mL — ABNORMAL HIGH (ref 0.450–4.500)

## 2024-01-01 LAB — VITAMIN D 25 HYDROXY (VIT D DEFICIENCY, FRACTURES): Vit D, 25-Hydroxy: 44.6 ng/mL (ref 30.0–100.0)

## 2024-01-04 DIAGNOSIS — N184 Chronic kidney disease, stage 4 (severe): Secondary | ICD-10-CM | POA: Diagnosis not present

## 2024-01-04 DIAGNOSIS — D631 Anemia in chronic kidney disease: Secondary | ICD-10-CM | POA: Diagnosis not present

## 2024-01-04 DIAGNOSIS — E039 Hypothyroidism, unspecified: Secondary | ICD-10-CM | POA: Diagnosis not present

## 2024-01-04 DIAGNOSIS — I10 Essential (primary) hypertension: Secondary | ICD-10-CM | POA: Diagnosis not present

## 2024-01-04 DIAGNOSIS — G311 Senile degeneration of brain, not elsewhere classified: Secondary | ICD-10-CM | POA: Diagnosis not present

## 2024-01-04 DIAGNOSIS — I509 Heart failure, unspecified: Secondary | ICD-10-CM | POA: Diagnosis not present

## 2024-01-06 ENCOUNTER — Telehealth: Payer: Self-pay | Admitting: Family Medicine

## 2024-01-06 DIAGNOSIS — I509 Heart failure, unspecified: Secondary | ICD-10-CM | POA: Diagnosis not present

## 2024-01-06 DIAGNOSIS — D631 Anemia in chronic kidney disease: Secondary | ICD-10-CM | POA: Diagnosis not present

## 2024-01-06 DIAGNOSIS — N184 Chronic kidney disease, stage 4 (severe): Secondary | ICD-10-CM | POA: Diagnosis not present

## 2024-01-06 DIAGNOSIS — I10 Essential (primary) hypertension: Secondary | ICD-10-CM | POA: Diagnosis not present

## 2024-01-06 DIAGNOSIS — G311 Senile degeneration of brain, not elsewhere classified: Secondary | ICD-10-CM | POA: Diagnosis not present

## 2024-01-06 DIAGNOSIS — E039 Hypothyroidism, unspecified: Secondary | ICD-10-CM | POA: Diagnosis not present

## 2024-01-06 NOTE — Telephone Encounter (Signed)
FMLA forms  Copied Noted Sleeved (put in provider box)  Call son Duffy Rhody at [phone] when forms are ready

## 2024-01-08 DIAGNOSIS — G40409 Other generalized epilepsy and epileptic syndromes, not intractable, without status epilepticus: Secondary | ICD-10-CM | POA: Diagnosis not present

## 2024-01-08 DIAGNOSIS — I669 Occlusion and stenosis of unspecified cerebral artery: Secondary | ICD-10-CM | POA: Diagnosis not present

## 2024-01-08 DIAGNOSIS — G311 Senile degeneration of brain, not elsewhere classified: Secondary | ICD-10-CM | POA: Diagnosis not present

## 2024-01-08 DIAGNOSIS — N184 Chronic kidney disease, stage 4 (severe): Secondary | ICD-10-CM | POA: Diagnosis not present

## 2024-01-08 DIAGNOSIS — E039 Hypothyroidism, unspecified: Secondary | ICD-10-CM | POA: Diagnosis not present

## 2024-01-08 DIAGNOSIS — Z8673 Personal history of transient ischemic attack (TIA), and cerebral infarction without residual deficits: Secondary | ICD-10-CM | POA: Diagnosis not present

## 2024-01-08 DIAGNOSIS — E46 Unspecified protein-calorie malnutrition: Secondary | ICD-10-CM | POA: Diagnosis not present

## 2024-01-08 DIAGNOSIS — I519 Heart disease, unspecified: Secondary | ICD-10-CM | POA: Diagnosis not present

## 2024-01-08 DIAGNOSIS — I10 Essential (primary) hypertension: Secondary | ICD-10-CM | POA: Diagnosis not present

## 2024-01-08 DIAGNOSIS — D631 Anemia in chronic kidney disease: Secondary | ICD-10-CM | POA: Diagnosis not present

## 2024-01-08 DIAGNOSIS — I509 Heart failure, unspecified: Secondary | ICD-10-CM | POA: Diagnosis not present

## 2024-01-08 DIAGNOSIS — M1909 Primary osteoarthritis, other specified site: Secondary | ICD-10-CM | POA: Diagnosis not present

## 2024-01-10 ENCOUNTER — Ambulatory Visit: Payer: Self-pay | Admitting: Family Medicine

## 2024-01-10 NOTE — Telephone Encounter (Signed)
Spoke to patient's wife on behalf of her husband who was seen in the office on 1/24 for a diabetic foot ulcer. Wife stated that the foot ulcer is not improving and she is seeking additional advice from Dr. Lodema Hong on how to treat the area. Wife is also asking for an update on when the wound nurse is going to come out. Wife/patient is requesting a call back.   Copied from CRM [id number]. Topic: Clinical - Medical Advice >> Jan 10, 2024  8:41 AM Fuller Mandril wrote: Reason for CRM: Patient wife called states patient has spot on heel of foot was healing but now it is getting worse. Provider previously seen. Would like to know what can be put on it or done for it . Thank You Reason for Disposition  [1] Caller requesting NON-URGENT health information AND [2] PCP's office is the best resource  Answer Assessment - Initial Assessment Questions 1. SYMPTOM: "What's the main symptom you're concerned about?" (e.g., rash, sore, callus, drainage, numbness)     Sore on right heel 2. LOCATION: "Where is the sore located?" (e.g., foot/toe, top/bottom, left/right)     Right heel  3. APPEARANCE: "What does the area look like?" (e.g., normal, red, swollen; size)     Black, bigger than a quarter, swelling in foot, denies drainage 4. ONSET: "When did the N/A  start?"     N/A 5. PAIN: "Is there any pain?" If Yes, ask: "How bad is it?" (Scale: 1-10; mild, moderate, severe)     N/A 6. CAUSE: "What do you think is causing the symptoms?"     N/A 7. OTHER SYMPTOMS: "Do you have any other symptoms?" (e.g., fever, weakness)     N/A  Answer Assessment - Initial Assessment Questions 1. REASON FOR CALL or QUESTION: "What is your reason for calling today?" or "How can I best help you?" or "What question do you have that I can help answer?"     Patient has a wound on his right heel, patient was seen in the office for it on 1/24, wife of the patient is seek additional advice on how they can treat the area, wife also stated that Dr.  Lodema Hong was supposed to arrange a nurse to come look at it and is requesting an update  Protocols used: Diabetes - Foot Problems and Questions-A-AH, Information Only Call - No Triage-A-AH

## 2024-01-11 ENCOUNTER — Encounter: Payer: Self-pay | Admitting: Family Medicine

## 2024-01-11 ENCOUNTER — Other Ambulatory Visit: Payer: Self-pay

## 2024-01-11 ENCOUNTER — Other Ambulatory Visit: Payer: Self-pay | Admitting: Family Medicine

## 2024-01-11 DIAGNOSIS — I509 Heart failure, unspecified: Secondary | ICD-10-CM | POA: Diagnosis not present

## 2024-01-11 DIAGNOSIS — D631 Anemia in chronic kidney disease: Secondary | ICD-10-CM | POA: Diagnosis not present

## 2024-01-11 DIAGNOSIS — L97519 Non-pressure chronic ulcer of other part of right foot with unspecified severity: Secondary | ICD-10-CM | POA: Insufficient documentation

## 2024-01-11 DIAGNOSIS — G311 Senile degeneration of brain, not elsewhere classified: Secondary | ICD-10-CM | POA: Diagnosis not present

## 2024-01-11 DIAGNOSIS — E039 Hypothyroidism, unspecified: Secondary | ICD-10-CM | POA: Diagnosis not present

## 2024-01-11 DIAGNOSIS — L97515 Non-pressure chronic ulcer of other part of right foot with muscle involvement without evidence of necrosis: Secondary | ICD-10-CM

## 2024-01-11 DIAGNOSIS — E559 Vitamin D deficiency, unspecified: Secondary | ICD-10-CM | POA: Insufficient documentation

## 2024-01-11 DIAGNOSIS — I10 Essential (primary) hypertension: Secondary | ICD-10-CM | POA: Diagnosis not present

## 2024-01-11 DIAGNOSIS — N184 Chronic kidney disease, stage 4 (severe): Secondary | ICD-10-CM | POA: Diagnosis not present

## 2024-01-11 NOTE — Telephone Encounter (Signed)
Pls do urgernt referral for wound care twice weekly fr pt at home, diabetes with ulcer on foot,pls ask and document if there is drainage from th eulcer, if so I will prescribe antibiotic, let me know asap  Pls let wife  know I apologize for the delay

## 2024-01-11 NOTE — Progress Notes (Signed)
   Chase [name]     MRN: [id number]      DOB: 05-17-1931  Chief Complaint  Patient presents with   Follow-up    Follow up     HPI Chase [name] is here for follow up and re-evaluation of chronic medical conditions, medication management and review of any available recent lab and radiology data.  Preventive health is updated, specifically  Cancer screening and Immunization.   Questions or concerns regarding consultations or procedures which the PT has had in the interim are  addressed. The PT denies any adverse reactions to current medications since the last visit.  C/o ulcer under right foot ROS Denies recent fever or chills. Denies sinus pressure, nasal congestion, ear pain or sore throat. Denies chest congestion, productive cough or wheezing. Denies chest pains, palpitations and leg swelling Denies abdominal pain, nausea, vomiting,diarrhea or constipation.   Denies dysuria, frequency, hesitancy or incontinence. Marked limirttion in mopbility, wheelchair dependentPE  BP 123/83 (BP Location: Left Arm, Patient Position: Sitting, Cuff Size: Normal)   Pulse 76   Ht 5\' 8"  (1.727 m)   SpO2 92%   BMI 25.24 kg/m   Patient alert and oriented and in no cardiopulmonary distress.  HEENT: No facial asymmetry, EOMI,     Neck supple .  Chest: Clear to auscultation bilaterally.  CVS: S1, S2 no murmurs, no S3.Regular rate.  ABD: Soft non tender.   Ext: No edema  MS: decreased  ROM spine, shoulders, hips and knees.  Skin: I3 in superficial ulcer under right foot Psychsevere memeory loss CNS: CN 2-12 intact, power,  normal throughout.   Assessment & Plan  Foot ulcer, right (HCC) Examined, superficil approx 3 in, advised to keep foot off surface , dry dressing and frefer for wound nurse  Hypothyroidism Controlled no med change  Essential hypertension, benign Controlled, no change in medication   Dementia (HCC) Continue current medicartion  Mixed  hyperlipidemia Hyperlipidemia:Low fat diet discussed and encouraged.   Lipid Panel  Lab Results  Component Value Date   CHOL 139 08/31/2023   HDL 53 08/31/2023   LDLCALC 73 08/31/2023   TRIG 64 08/31/2023   CHOLHDL 2.6 08/31/2023     Controlled, no change in medication   Type 2 diabetes mellitus with diabetic nephropathy (HCC) Controlled, no change in management  Vitamin D deficiency Controlled, no change in medication

## 2024-01-11 NOTE — Assessment & Plan Note (Signed)
Controlled , no change in management 

## 2024-01-11 NOTE — Assessment & Plan Note (Signed)
Controlled no med change 

## 2024-01-11 NOTE — Assessment & Plan Note (Signed)
 Controlled, no change in medication

## 2024-01-11 NOTE — Assessment & Plan Note (Addendum)
Examined, superficil approx 3 in, advised to keep foot off surface , dry dressing and frefer for wound nurse

## 2024-01-11 NOTE — Assessment & Plan Note (Signed)
Continue current medicartion

## 2024-01-11 NOTE — Assessment & Plan Note (Signed)
Hyperlipidemia:Low fat diet discussed and encouraged.   Lipid Panel  Lab Results  Component Value Date   CHOL 139 08/31/2023   HDL 53 08/31/2023   LDLCALC 73 08/31/2023   TRIG 64 08/31/2023   CHOLHDL 2.6 08/31/2023     Controlled, no change in medication

## 2024-01-12 ENCOUNTER — Telehealth: Payer: Self-pay

## 2024-01-12 MED ORDER — DOXYCYCLINE HYCLATE 100 MG PO TABS: 100.0000 mg | ORAL_TABLET | Freq: Two times a day (BID) | ORAL | 0 refills | Status: DC

## 2024-01-12 NOTE — Telephone Encounter (Signed)
 I am prescribing doxycycline  100 mg twice daily for 10 days, this hs been sent in pls let spouse know Sent to [facility] in [place]

## 2024-01-12 NOTE — Telephone Encounter (Signed)
 Patient's wife is aware

## 2024-01-12 NOTE — Telephone Encounter (Signed)
 Called [facility] will pick up forms on Monday

## 2024-01-12 NOTE — Telephone Encounter (Signed)
 Copied from CRM [id number]. Topic: Clinical - Medical Advice >> Jan 12, 2024 12:09 PM Suzette B wrote: Reason for CRM: Patient spouse called in she states today the ulcer is looking worse red, and has not started with pus just yet, but worse than yesterday, she is very concerned, advised patient the orders had been put in for [facility] nurse to come out, but she states he needs some form of medication her phone number is [phone]

## 2024-01-13 DIAGNOSIS — D631 Anemia in chronic kidney disease: Secondary | ICD-10-CM | POA: Diagnosis not present

## 2024-01-13 DIAGNOSIS — I10 Essential (primary) hypertension: Secondary | ICD-10-CM | POA: Diagnosis not present

## 2024-01-13 DIAGNOSIS — E039 Hypothyroidism, unspecified: Secondary | ICD-10-CM | POA: Diagnosis not present

## 2024-01-13 DIAGNOSIS — N184 Chronic kidney disease, stage 4 (severe): Secondary | ICD-10-CM | POA: Diagnosis not present

## 2024-01-13 DIAGNOSIS — I509 Heart failure, unspecified: Secondary | ICD-10-CM | POA: Diagnosis not present

## 2024-01-13 DIAGNOSIS — G311 Senile degeneration of brain, not elsewhere classified: Secondary | ICD-10-CM | POA: Diagnosis not present

## 2024-01-17 NOTE — Telephone Encounter (Signed)
 Copied from CRM [id number]. Topic: General - Other >> Jan 17, 2024  9:50 AM Graeme Lawn wrote: Reason for CRM: [facility] [phone] Calling because patient refused home health care services today, wants to know if order can be changed to begin tomorrow per patient request.

## 2024-01-18 DIAGNOSIS — N184 Chronic kidney disease, stage 4 (severe): Secondary | ICD-10-CM | POA: Diagnosis not present

## 2024-01-18 DIAGNOSIS — E039 Hypothyroidism, unspecified: Secondary | ICD-10-CM | POA: Diagnosis not present

## 2024-01-18 DIAGNOSIS — I10 Essential (primary) hypertension: Secondary | ICD-10-CM | POA: Diagnosis not present

## 2024-01-18 DIAGNOSIS — G311 Senile degeneration of brain, not elsewhere classified: Secondary | ICD-10-CM | POA: Diagnosis not present

## 2024-01-18 DIAGNOSIS — D631 Anemia in chronic kidney disease: Secondary | ICD-10-CM | POA: Diagnosis not present

## 2024-01-18 DIAGNOSIS — I509 Heart failure, unspecified: Secondary | ICD-10-CM | POA: Diagnosis not present

## 2024-01-18 NOTE — Telephone Encounter (Signed)
Verbal given to change home health order to today

## 2024-01-21 DIAGNOSIS — I10 Essential (primary) hypertension: Secondary | ICD-10-CM | POA: Diagnosis not present

## 2024-01-21 DIAGNOSIS — N184 Chronic kidney disease, stage 4 (severe): Secondary | ICD-10-CM | POA: Diagnosis not present

## 2024-01-21 DIAGNOSIS — D631 Anemia in chronic kidney disease: Secondary | ICD-10-CM | POA: Diagnosis not present

## 2024-01-21 DIAGNOSIS — G311 Senile degeneration of brain, not elsewhere classified: Secondary | ICD-10-CM | POA: Diagnosis not present

## 2024-01-21 DIAGNOSIS — I509 Heart failure, unspecified: Secondary | ICD-10-CM | POA: Diagnosis not present

## 2024-01-21 DIAGNOSIS — E039 Hypothyroidism, unspecified: Secondary | ICD-10-CM | POA: Diagnosis not present

## 2024-01-24 DIAGNOSIS — E039 Hypothyroidism, unspecified: Secondary | ICD-10-CM | POA: Diagnosis not present

## 2024-01-24 DIAGNOSIS — N184 Chronic kidney disease, stage 4 (severe): Secondary | ICD-10-CM | POA: Diagnosis not present

## 2024-01-24 DIAGNOSIS — I509 Heart failure, unspecified: Secondary | ICD-10-CM | POA: Diagnosis not present

## 2024-01-24 DIAGNOSIS — D631 Anemia in chronic kidney disease: Secondary | ICD-10-CM | POA: Diagnosis not present

## 2024-01-24 DIAGNOSIS — G311 Senile degeneration of brain, not elsewhere classified: Secondary | ICD-10-CM | POA: Diagnosis not present

## 2024-01-24 DIAGNOSIS — I10 Essential (primary) hypertension: Secondary | ICD-10-CM | POA: Diagnosis not present

## 2024-01-25 DIAGNOSIS — E039 Hypothyroidism, unspecified: Secondary | ICD-10-CM | POA: Diagnosis not present

## 2024-01-25 DIAGNOSIS — D631 Anemia in chronic kidney disease: Secondary | ICD-10-CM | POA: Diagnosis not present

## 2024-01-25 DIAGNOSIS — I509 Heart failure, unspecified: Secondary | ICD-10-CM | POA: Diagnosis not present

## 2024-01-25 DIAGNOSIS — G311 Senile degeneration of brain, not elsewhere classified: Secondary | ICD-10-CM | POA: Diagnosis not present

## 2024-01-25 DIAGNOSIS — N184 Chronic kidney disease, stage 4 (severe): Secondary | ICD-10-CM | POA: Diagnosis not present

## 2024-01-25 DIAGNOSIS — I10 Essential (primary) hypertension: Secondary | ICD-10-CM | POA: Diagnosis not present

## 2024-01-28 ENCOUNTER — Ambulatory Visit: Payer: Medicare Other | Admitting: Nurse Practitioner

## 2024-02-01 ENCOUNTER — Ambulatory Visit: Payer: Medicare Other

## 2024-02-02 ENCOUNTER — Ambulatory Visit: Payer: Medicare Other

## 2024-02-05 DEATH — deceased

## 2024-02-22 ENCOUNTER — Ambulatory Visit: Payer: Medicare Other

## 2024-03-03 ENCOUNTER — Ambulatory Visit: Payer: Medicare Other | Admitting: Family Medicine
